# Patient Record
Sex: Female | Born: 1940
Health system: Southern US, Community
[De-identification: ages and names within clinical notes are randomized; demographics above are authoritative.]

## PROBLEM LIST (undated history)

## (undated) DIAGNOSIS — Z9889 Other specified postprocedural states: Secondary | ICD-10-CM

## (undated) DIAGNOSIS — I10 Essential (primary) hypertension: Secondary | ICD-10-CM

## (undated) DIAGNOSIS — Z8719 Personal history of other diseases of the digestive system: Secondary | ICD-10-CM

## (undated) DIAGNOSIS — F329 Major depressive disorder, single episode, unspecified: Secondary | ICD-10-CM

## (undated) DIAGNOSIS — I2510 Atherosclerotic heart disease of native coronary artery without angina pectoris: Secondary | ICD-10-CM

## (undated) DIAGNOSIS — R413 Other amnesia: Secondary | ICD-10-CM

## (undated) DIAGNOSIS — F419 Anxiety disorder, unspecified: Secondary | ICD-10-CM

## (undated) DIAGNOSIS — Z789 Other specified health status: Secondary | ICD-10-CM

## (undated) DIAGNOSIS — R911 Solitary pulmonary nodule: Secondary | ICD-10-CM

## (undated) DIAGNOSIS — I495 Sick sinus syndrome: Secondary | ICD-10-CM

## (undated) DIAGNOSIS — K2210 Ulcer of esophagus without bleeding: Secondary | ICD-10-CM

## (undated) DIAGNOSIS — I509 Heart failure, unspecified: Secondary | ICD-10-CM

## (undated) DIAGNOSIS — G8929 Other chronic pain: Secondary | ICD-10-CM

## (undated) DIAGNOSIS — Z86010 Personal history of colonic polyps: Secondary | ICD-10-CM

## (undated) DIAGNOSIS — E039 Hypothyroidism, unspecified: Secondary | ICD-10-CM

## (undated) DIAGNOSIS — R011 Cardiac murmur, unspecified: Secondary | ICD-10-CM

## (undated) DIAGNOSIS — I6523 Occlusion and stenosis of bilateral carotid arteries: Secondary | ICD-10-CM

## (undated) DIAGNOSIS — J42 Unspecified chronic bronchitis: Secondary | ICD-10-CM

## (undated) DIAGNOSIS — M25551 Pain in right hip: Secondary | ICD-10-CM

## (undated) DIAGNOSIS — K219 Gastro-esophageal reflux disease without esophagitis: Secondary | ICD-10-CM

## (undated) DIAGNOSIS — E785 Hyperlipidemia, unspecified: Secondary | ICD-10-CM

## (undated) DIAGNOSIS — M1990 Unspecified osteoarthritis, unspecified site: Secondary | ICD-10-CM

## (undated) DIAGNOSIS — R51 Headache: Secondary | ICD-10-CM

## (undated) DIAGNOSIS — Z950 Presence of cardiac pacemaker: Secondary | ICD-10-CM

## (undated) DIAGNOSIS — M545 Low back pain, unspecified: Secondary | ICD-10-CM

## (undated) DIAGNOSIS — G4733 Obstructive sleep apnea (adult) (pediatric): Secondary | ICD-10-CM

## (undated) DIAGNOSIS — F411 Generalized anxiety disorder: Secondary | ICD-10-CM

## (undated) DIAGNOSIS — Z9289 Personal history of other medical treatment: Secondary | ICD-10-CM

## (undated) DIAGNOSIS — G43909 Migraine, unspecified, not intractable, without status migrainosus: Secondary | ICD-10-CM

## (undated) DIAGNOSIS — F3181 Bipolar II disorder: Secondary | ICD-10-CM

## (undated) DIAGNOSIS — Z860101 Personal history of adenomatous and serrated colon polyps: Secondary | ICD-10-CM

## (undated) DIAGNOSIS — N182 Chronic kidney disease, stage 2 (mild): Secondary | ICD-10-CM

## (undated) DIAGNOSIS — R519 Headache, unspecified: Secondary | ICD-10-CM

## (undated) HISTORY — PX: TUBAL LIGATION: SHX77

## (undated) HISTORY — PX: ESOPHAGOGASTRODUODENOSCOPY: SHX1529

## (undated) HISTORY — PX: AUGMENTATION MAMMAPLASTY: SUR837

## (undated) HISTORY — DX: Anxiety disorder, unspecified: F41.9

## (undated) HISTORY — PX: CARDIOVASCULAR STRESS TEST: SHX262

## (undated) HISTORY — DX: Migraine, unspecified, not intractable, without status migrainosus: G43.909

## (undated) HISTORY — PX: VAGINAL HYSTERECTOMY: SUR661

## (undated) HISTORY — PX: COLONOSCOPY WITH ESOPHAGOGASTRODUODENOSCOPY (EGD): SHX5779

## (undated) HISTORY — PX: TRANSTHORACIC ECHOCARDIOGRAM: SHX275

## (undated) HISTORY — PX: OVARIAN CYST SURGERY: SHX726

## (undated) HISTORY — PX: OTHER SURGICAL HISTORY: SHX169

## (undated) HISTORY — DX: Essential (primary) hypertension: I10

## (undated) HISTORY — DX: Hyperlipidemia, unspecified: E78.5

## (undated) HISTORY — PX: COLONOSCOPY W/ BIOPSIES AND POLYPECTOMY: SHX1376

## (undated) HISTORY — DX: Gastro-esophageal reflux disease without esophagitis: K21.9

---

## 1969-10-04 HISTORY — PX: APPENDECTOMY: SHX54

## 1999-07-20 ENCOUNTER — Ambulatory Visit (HOSPITAL_COMMUNITY): Admission: RE | Admit: 1999-07-20 | Discharge: 1999-07-20 | Payer: Self-pay | Admitting: Obstetrics and Gynecology

## 1999-07-20 ENCOUNTER — Encounter: Payer: Self-pay | Admitting: Obstetrics and Gynecology

## 1999-08-03 ENCOUNTER — Other Ambulatory Visit: Admission: RE | Admit: 1999-08-03 | Discharge: 1999-08-03 | Payer: Self-pay | Admitting: Obstetrics and Gynecology

## 1999-08-18 ENCOUNTER — Encounter: Admission: RE | Admit: 1999-08-18 | Discharge: 1999-08-18 | Payer: Self-pay | Admitting: Obstetrics and Gynecology

## 1999-08-18 ENCOUNTER — Encounter: Payer: Self-pay | Admitting: Obstetrics and Gynecology

## 1999-08-21 ENCOUNTER — Encounter: Payer: Self-pay | Admitting: Obstetrics and Gynecology

## 1999-08-21 ENCOUNTER — Ambulatory Visit (HOSPITAL_COMMUNITY): Admission: RE | Admit: 1999-08-21 | Discharge: 1999-08-21 | Payer: Self-pay | Admitting: Obstetrics and Gynecology

## 1999-09-15 ENCOUNTER — Encounter: Payer: Self-pay | Admitting: Internal Medicine

## 1999-09-15 ENCOUNTER — Ambulatory Visit (HOSPITAL_COMMUNITY): Admission: RE | Admit: 1999-09-15 | Discharge: 1999-09-15 | Payer: Self-pay | Admitting: Internal Medicine

## 2000-07-21 ENCOUNTER — Other Ambulatory Visit: Admission: RE | Admit: 2000-07-21 | Discharge: 2000-07-21 | Payer: Self-pay | Admitting: Obstetrics and Gynecology

## 2000-09-15 ENCOUNTER — Encounter: Payer: Self-pay | Admitting: Obstetrics and Gynecology

## 2000-09-15 ENCOUNTER — Encounter: Admission: RE | Admit: 2000-09-15 | Discharge: 2000-09-15 | Payer: Self-pay | Admitting: Obstetrics and Gynecology

## 2001-08-07 ENCOUNTER — Other Ambulatory Visit: Admission: RE | Admit: 2001-08-07 | Discharge: 2001-08-07 | Payer: Self-pay | Admitting: Obstetrics and Gynecology

## 2001-10-09 ENCOUNTER — Encounter: Payer: Self-pay | Admitting: Obstetrics and Gynecology

## 2001-10-09 ENCOUNTER — Encounter: Admission: RE | Admit: 2001-10-09 | Discharge: 2001-10-09 | Payer: Self-pay | Admitting: Obstetrics and Gynecology

## 2001-10-11 ENCOUNTER — Encounter: Admission: RE | Admit: 2001-10-11 | Discharge: 2001-10-11 | Payer: Self-pay | Admitting: *Deleted

## 2002-09-05 ENCOUNTER — Other Ambulatory Visit: Admission: RE | Admit: 2002-09-05 | Discharge: 2002-09-05 | Payer: Self-pay | Admitting: Obstetrics and Gynecology

## 2002-10-10 ENCOUNTER — Encounter: Payer: Self-pay | Admitting: Obstetrics and Gynecology

## 2002-10-10 ENCOUNTER — Encounter: Admission: RE | Admit: 2002-10-10 | Discharge: 2002-10-10 | Payer: Self-pay | Admitting: Obstetrics and Gynecology

## 2003-08-14 ENCOUNTER — Encounter: Admission: RE | Admit: 2003-08-14 | Discharge: 2003-08-14 | Payer: Self-pay | Admitting: Obstetrics and Gynecology

## 2003-09-16 ENCOUNTER — Other Ambulatory Visit: Admission: RE | Admit: 2003-09-16 | Discharge: 2003-09-16 | Payer: Self-pay | Admitting: Obstetrics and Gynecology

## 2004-02-11 ENCOUNTER — Inpatient Hospital Stay (HOSPITAL_COMMUNITY): Admission: EM | Admit: 2004-02-11 | Discharge: 2004-02-15 | Payer: Self-pay | Admitting: Internal Medicine

## 2004-02-12 ENCOUNTER — Encounter (INDEPENDENT_AMBULATORY_CARE_PROVIDER_SITE_OTHER): Payer: Self-pay | Admitting: Specialist

## 2004-02-26 ENCOUNTER — Ambulatory Visit (HOSPITAL_COMMUNITY): Admission: RE | Admit: 2004-02-26 | Discharge: 2004-02-26 | Payer: Self-pay | Admitting: Internal Medicine

## 2004-02-26 ENCOUNTER — Encounter (INDEPENDENT_AMBULATORY_CARE_PROVIDER_SITE_OTHER): Payer: Self-pay | Admitting: Specialist

## 2004-02-26 HISTORY — PX: ESOPHAGOGASTRODUODENOSCOPY: SHX1529

## 2004-08-17 ENCOUNTER — Encounter: Admission: RE | Admit: 2004-08-17 | Discharge: 2004-08-17 | Payer: Self-pay | Admitting: Internal Medicine

## 2004-09-16 ENCOUNTER — Other Ambulatory Visit: Admission: RE | Admit: 2004-09-16 | Discharge: 2004-09-16 | Payer: Self-pay | Admitting: Obstetrics and Gynecology

## 2005-08-17 ENCOUNTER — Ambulatory Visit: Payer: Self-pay | Admitting: Internal Medicine

## 2005-08-18 ENCOUNTER — Encounter: Admission: RE | Admit: 2005-08-18 | Discharge: 2005-08-18 | Payer: Self-pay | Admitting: Obstetrics and Gynecology

## 2005-09-01 ENCOUNTER — Ambulatory Visit: Payer: Self-pay | Admitting: Internal Medicine

## 2005-09-06 ENCOUNTER — Other Ambulatory Visit: Admission: RE | Admit: 2005-09-06 | Discharge: 2005-09-06 | Payer: Self-pay | Admitting: Obstetrics and Gynecology

## 2005-09-07 ENCOUNTER — Ambulatory Visit: Payer: Self-pay | Admitting: Internal Medicine

## 2005-09-14 ENCOUNTER — Ambulatory Visit: Payer: Self-pay | Admitting: Cardiology

## 2006-08-03 ENCOUNTER — Ambulatory Visit: Payer: Self-pay | Admitting: Internal Medicine

## 2006-08-16 ENCOUNTER — Ambulatory Visit: Payer: Self-pay | Admitting: Internal Medicine

## 2006-08-24 ENCOUNTER — Other Ambulatory Visit: Admission: RE | Admit: 2006-08-24 | Discharge: 2006-08-24 | Payer: Self-pay | Admitting: Obstetrics and Gynecology

## 2006-09-15 ENCOUNTER — Ambulatory Visit: Payer: Self-pay | Admitting: Internal Medicine

## 2006-12-20 ENCOUNTER — Ambulatory Visit: Payer: Self-pay | Admitting: Internal Medicine

## 2007-01-30 ENCOUNTER — Ambulatory Visit: Payer: Self-pay | Admitting: Internal Medicine

## 2007-01-30 LAB — CONVERTED CEMR LAB
ALT: 21 units/L (ref 0–40)
AST: 22 units/L (ref 0–37)
Albumin: 3.8 g/dL (ref 3.5–5.2)
Alkaline Phosphatase: 42 units/L (ref 39–117)
BUN: 14 mg/dL (ref 6–23)
Bacteria, UA: NEGATIVE
Basophils Absolute: 0.1 10*3/uL (ref 0.0–0.1)
Basophils Relative: 1 % (ref 0.0–1.0)
Bilirubin Urine: NEGATIVE
Bilirubin, Direct: 0.1 mg/dL (ref 0.0–0.3)
CO2: 31 meq/L (ref 19–32)
Calcium: 9.2 mg/dL (ref 8.4–10.5)
Chloride: 107 meq/L (ref 96–112)
Cholesterol: 231 mg/dL (ref 0–200)
Creatinine, Ser: 0.9 mg/dL (ref 0.4–1.2)
Crystals: NEGATIVE
Direct LDL: 87.5 mg/dL
Eosinophils Absolute: 0.2 10*3/uL (ref 0.0–0.6)
Eosinophils Relative: 3.4 % (ref 0.0–5.0)
GFR calc Af Amer: 80 mL/min
GFR calc non Af Amer: 66 mL/min
Glucose, Bld: 103 mg/dL — ABNORMAL HIGH (ref 70–99)
HCT: 39.5 % (ref 36.0–46.0)
HDL: 107.8 mg/dL (ref >39.0)
Hemoglobin: 13.4 g/dL (ref 12.0–15.0)
Ketones, ur: NEGATIVE mg/dL
Lymphocytes Relative: 40.6 % (ref 12.0–46.0)
MCHC: 34 g/dL (ref 30.0–36.0)
MCV: 96.6 fL (ref 78.0–100.0)
Monocytes Absolute: 0.6 10*3/uL (ref 0.2–0.7)
Monocytes Relative: 11.4 % — ABNORMAL HIGH (ref 3.0–11.0)
Mucus, UA: NEGATIVE
Neutro Abs: 2.3 10*3/uL (ref 1.4–7.7)
Neutrophils Relative %: 43.6 % (ref 43.0–77.0)
Nitrite: NEGATIVE
Platelets: 238 10*3/uL (ref 150–400)
Potassium: 4 meq/L (ref 3.5–5.1)
RBC: 4.09 M/uL (ref 3.87–5.11)
RDW: 12.3 % (ref 11.5–14.6)
Sodium: 143 meq/L (ref 135–145)
Specific Gravity, Urine: <=1.005 (ref 1.000–1.03)
TSH: 2.88 microintl units/mL (ref 0.35–5.50)
Total Bilirubin: 0.5 mg/dL (ref 0.3–1.2)
Total CHOL/HDL Ratio: 2.1
Total Protein, Urine: NEGATIVE mg/dL
Total Protein: 6.7 g/dL (ref 6.0–8.3)
Triglycerides: 36 mg/dL (ref 0–149)
Urine Glucose: NEGATIVE mg/dL
Urobilinogen, UA: 0.2 (ref 0.0–1.0)
VLDL: 7 mg/dL (ref 0–40)
Vit D, 1,25-Dihydroxy: 33 (ref 20–57)
Vitamin B-12: 617 pg/mL (ref 211–911)
WBC: 5.4 10*3/uL (ref 4.5–10.5)
pH: 7 (ref 5.0–8.0)

## 2007-02-08 ENCOUNTER — Ambulatory Visit: Payer: Self-pay | Admitting: Cardiology

## 2007-02-08 ENCOUNTER — Ambulatory Visit: Payer: Self-pay

## 2007-03-10 ENCOUNTER — Ambulatory Visit: Payer: Self-pay | Admitting: Internal Medicine

## 2007-03-13 ENCOUNTER — Encounter: Admission: RE | Admit: 2007-03-13 | Discharge: 2007-03-13 | Payer: Self-pay | Admitting: Neurology

## 2007-03-28 ENCOUNTER — Ambulatory Visit: Payer: Self-pay | Admitting: Internal Medicine

## 2007-03-28 LAB — CONVERTED CEMR LAB
ALT: 19 units/L (ref 0–40)
AST: 19 units/L (ref 0–37)
Albumin: 3.7 g/dL (ref 3.5–5.2)
Alkaline Phosphatase: 35 units/L — ABNORMAL LOW (ref 39–117)
BUN: 18 mg/dL (ref 6–23)
Basophils Absolute: 0 10*3/uL (ref 0.0–0.1)
Basophils Relative: 0.1 % (ref 0.0–1.0)
Bilirubin Urine: NEGATIVE
Bilirubin, Direct: 0.1 mg/dL (ref 0.0–0.3)
CO2: 28 meq/L (ref 19–32)
Calcium: 9.4 mg/dL (ref 8.4–10.5)
Chloride: 96 meq/L (ref 96–112)
Creatinine, Ser: 0.8 mg/dL (ref 0.4–1.2)
Crystals: NEGATIVE
Eosinophils Absolute: 0.1 10*3/uL (ref 0.0–0.6)
Eosinophils Relative: 1.5 % (ref 0.0–5.0)
GFR calc Af Amer: 92 mL/min
GFR calc non Af Amer: 76 mL/min
Glucose, Bld: 115 mg/dL — ABNORMAL HIGH (ref 70–99)
HCT: 38.8 % (ref 36.0–46.0)
Hemoglobin: 13 g/dL (ref 12.0–15.0)
Ketones, ur: NEGATIVE mg/dL
Lymphocytes Relative: 28.6 % (ref 12.0–46.0)
MCHC: 33.5 g/dL (ref 30.0–36.0)
MCV: 96.2 fL (ref 78.0–100.0)
Monocytes Absolute: 0.6 10*3/uL (ref 0.2–0.7)
Monocytes Relative: 9.5 % (ref 3.0–11.0)
Mucus, UA: NEGATIVE
Neutro Abs: 4.2 10*3/uL (ref 1.4–7.7)
Neutrophils Relative %: 60.3 % (ref 43.0–77.0)
Nitrite: NEGATIVE
Platelets: 246 10*3/uL (ref 150–400)
Potassium: 4 meq/L (ref 3.5–5.1)
RBC: 4.04 M/uL (ref 3.87–5.11)
RDW: 11.6 % (ref 11.5–14.6)
Sed Rate: 10 mm/hr (ref 0–25)
Sodium: 138 meq/L (ref 135–145)
Specific Gravity, Urine: <=1.005 (ref 1.000–1.03)
Total Bilirubin: 0.4 mg/dL (ref 0.3–1.2)
Total Protein, Urine: NEGATIVE mg/dL
Total Protein: 6.1 g/dL (ref 6.0–8.3)
Urine Glucose: NEGATIVE mg/dL
Urobilinogen, UA: 0.2 (ref 0.0–1.0)
WBC: 6.8 10*3/uL (ref 4.5–10.5)
pH: 6.5 (ref 5.0–8.0)

## 2007-04-10 ENCOUNTER — Ambulatory Visit: Payer: Self-pay | Admitting: Cardiology

## 2007-04-10 LAB — CONVERTED CEMR LAB
ALT: 22 units/L (ref 0–35)
AST: 17 units/L (ref 0–37)
Albumin: 3.6 g/dL (ref 3.5–5.2)
Alkaline Phosphatase: 41 units/L (ref 39–117)
BUN: 15 mg/dL (ref 6–23)
Basophils Absolute: 0 10*3/uL (ref 0.0–0.1)
Basophils Relative: 0.6 % (ref 0.0–1.0)
Bilirubin, Direct: 0.1 mg/dL (ref 0.0–0.3)
CO2: 34 meq/L — ABNORMAL HIGH (ref 19–32)
Calcium: 8.9 mg/dL (ref 8.4–10.5)
Chloride: 104 meq/L (ref 96–112)
Cholesterol: 215 mg/dL (ref 0–200)
Creatinine, Ser: 0.8 mg/dL (ref 0.4–1.2)
Direct LDL: 98.2 mg/dL
Eosinophils Absolute: 0.1 10*3/uL (ref 0.0–0.6)
Eosinophils Relative: 1 % (ref 0.0–5.0)
GFR calc Af Amer: 92 mL/min
GFR calc non Af Amer: 76 mL/min
Glucose, Bld: 95 mg/dL (ref 70–99)
HCT: 37.9 % (ref 36.0–46.0)
HDL: 104.6 mg/dL (ref >39.0)
Hemoglobin: 12.7 g/dL (ref 12.0–15.0)
Lymphocytes Relative: 23.6 % (ref 12.0–46.0)
MCHC: 33.5 g/dL (ref 30.0–36.0)
MCV: 95.8 fL (ref 78.0–100.0)
Monocytes Absolute: 0.6 10*3/uL (ref 0.2–0.7)
Monocytes Relative: 10.8 % (ref 3.0–11.0)
Neutro Abs: 3.7 10*3/uL (ref 1.4–7.7)
Neutrophils Relative %: 64 % (ref 43.0–77.0)
Platelets: 248 10*3/uL (ref 150–400)
Potassium: 3.4 meq/L — ABNORMAL LOW (ref 3.5–5.1)
RBC: 3.95 M/uL (ref 3.87–5.11)
RDW: 12.2 % (ref 11.5–14.6)
Sodium: 138 meq/L (ref 135–145)
TSH: 2.42 microintl units/mL (ref 0.35–5.50)
Total Bilirubin: 0.5 mg/dL (ref 0.3–1.2)
Total CHOL/HDL Ratio: 2.1
Total CK: 54 units/L (ref 7–177)
Total Protein: 6.4 g/dL (ref 6.0–8.3)
Triglycerides: 57 mg/dL (ref 0–149)
VLDL: 11 mg/dL (ref 0–40)
WBC: 5.7 10*3/uL (ref 4.5–10.5)

## 2007-06-12 ENCOUNTER — Encounter: Admission: RE | Admit: 2007-06-12 | Discharge: 2007-06-12 | Payer: Self-pay | Admitting: Obstetrics and Gynecology

## 2007-06-15 ENCOUNTER — Ambulatory Visit: Payer: Self-pay | Admitting: Internal Medicine

## 2007-06-15 LAB — CONVERTED CEMR LAB
ALT: 15 units/L (ref 0–35)
AST: 15 units/L (ref 0–37)
Albumin: 3.8 g/dL (ref 3.5–5.2)
Alkaline Phosphatase: 42 units/L (ref 39–117)
BUN: 11 mg/dL (ref 6–23)
Bacteria, UA: NEGATIVE
Basophils Absolute: 0 10*3/uL (ref 0.0–0.1)
Basophils Relative: 0.5 % (ref 0.0–1.0)
Bilirubin Urine: NEGATIVE
Bilirubin, Direct: 0.1 mg/dL (ref 0.0–0.3)
CO2: 31 meq/L (ref 19–32)
Calcium: 9 mg/dL (ref 8.4–10.5)
Chloride: 101 meq/L (ref 96–112)
Cholesterol: 199 mg/dL (ref 0–200)
Cortisol, Plasma: 21.8 ug/dL
Creatinine, Ser: 1 mg/dL (ref 0.4–1.2)
Crystals: NEGATIVE
Eosinophils Absolute: 0.1 10*3/uL (ref 0.0–0.6)
Eosinophils Relative: 2.5 % (ref 0.0–5.0)
GFR calc Af Amer: 71 mL/min
GFR calc non Af Amer: 59 mL/min
Glucose, Bld: 98 mg/dL (ref 70–99)
HCT: 36.2 % (ref 36.0–46.0)
HDL: 104.1 mg/dL (ref >39.0)
Hemoglobin: 12.2 g/dL (ref 12.0–15.0)
Ketones, ur: NEGATIVE mg/dL
LDL Cholesterol: 89 mg/dL (ref 0–99)
Lymphocytes Relative: 30.1 % (ref 12.0–46.0)
MCHC: 33.8 g/dL (ref 30.0–36.0)
MCV: 94.7 fL (ref 78.0–100.0)
Monocytes Absolute: 0.5 10*3/uL (ref 0.2–0.7)
Monocytes Relative: 10.2 % (ref 3.0–11.0)
Neutro Abs: 2.8 10*3/uL (ref 1.4–7.7)
Neutrophils Relative %: 56.7 % (ref 43.0–77.0)
Nitrite: NEGATIVE
Platelets: 239 10*3/uL (ref 150–400)
Potassium: 3.4 meq/L — ABNORMAL LOW (ref 3.5–5.1)
RBC: 3.82 M/uL — ABNORMAL LOW (ref 3.87–5.11)
RDW: 12.1 % (ref 11.5–14.6)
Sodium: 138 meq/L (ref 135–145)
Specific Gravity, Urine: 1.015 (ref 1.000–1.03)
TSH: 3.55 microintl units/mL (ref 0.35–5.50)
Total Bilirubin: 0.6 mg/dL (ref 0.3–1.2)
Total CHOL/HDL Ratio: 1.9
Total Protein, Urine: NEGATIVE mg/dL
Total Protein: 6.2 g/dL (ref 6.0–8.3)
Triglycerides: 31 mg/dL (ref 0–149)
Urine Glucose: NEGATIVE mg/dL
Urobilinogen, UA: 0.2 (ref 0.0–1.0)
VLDL: 6 mg/dL (ref 0–40)
Vitamin B-12: 404 pg/mL (ref 211–911)
WBC: 4.8 10*3/uL (ref 4.5–10.5)
pH: 6 (ref 5.0–8.0)

## 2007-07-21 ENCOUNTER — Ambulatory Visit: Payer: Self-pay | Admitting: Internal Medicine

## 2007-07-27 ENCOUNTER — Telehealth: Payer: Self-pay | Admitting: Internal Medicine

## 2007-08-22 ENCOUNTER — Encounter: Payer: Self-pay | Admitting: Internal Medicine

## 2007-08-22 DIAGNOSIS — F411 Generalized anxiety disorder: Secondary | ICD-10-CM | POA: Insufficient documentation

## 2007-08-22 DIAGNOSIS — I25810 Atherosclerosis of coronary artery bypass graft(s) without angina pectoris: Secondary | ICD-10-CM

## 2007-08-22 DIAGNOSIS — M810 Age-related osteoporosis without current pathological fracture: Secondary | ICD-10-CM | POA: Insufficient documentation

## 2007-08-22 DIAGNOSIS — K219 Gastro-esophageal reflux disease without esophagitis: Secondary | ICD-10-CM

## 2007-08-22 DIAGNOSIS — M545 Low back pain: Secondary | ICD-10-CM

## 2007-08-22 DIAGNOSIS — E785 Hyperlipidemia, unspecified: Secondary | ICD-10-CM

## 2007-08-22 DIAGNOSIS — G4700 Insomnia, unspecified: Secondary | ICD-10-CM

## 2007-08-22 DIAGNOSIS — F319 Bipolar disorder, unspecified: Secondary | ICD-10-CM | POA: Insufficient documentation

## 2007-08-22 DIAGNOSIS — R011 Cardiac murmur, unspecified: Secondary | ICD-10-CM

## 2007-08-23 ENCOUNTER — Encounter: Payer: Self-pay | Admitting: Internal Medicine

## 2007-08-25 ENCOUNTER — Telehealth: Payer: Self-pay | Admitting: Internal Medicine

## 2007-08-28 ENCOUNTER — Other Ambulatory Visit: Admission: RE | Admit: 2007-08-28 | Discharge: 2007-08-28 | Payer: Self-pay | Admitting: Obstetrics and Gynecology

## 2008-03-20 ENCOUNTER — Ambulatory Visit: Payer: Self-pay | Admitting: Internal Medicine

## 2008-03-20 LAB — CONVERTED CEMR LAB
ALT: 57 units/L — ABNORMAL HIGH (ref 0–35)
AST: 40 units/L — ABNORMAL HIGH (ref 0–37)
Albumin: 3.7 g/dL (ref 3.5–5.2)
Alkaline Phosphatase: 56 units/L (ref 39–117)
BUN: 16 mg/dL (ref 6–23)
Basophils Absolute: 0 10*3/uL (ref 0.0–0.1)
Basophils Relative: 0.2 % (ref 0.0–1.0)
Bilirubin, Direct: 0.1 mg/dL (ref 0.0–0.3)
CO2: 32 meq/L (ref 19–32)
Calcium: 8.9 mg/dL (ref 8.4–10.5)
Chloride: 108 meq/L (ref 96–112)
Creatinine, Ser: 0.8 mg/dL (ref 0.4–1.2)
Eosinophils Absolute: 0.1 10*3/uL (ref 0.0–0.7)
Eosinophils Relative: 2.9 % (ref 0.0–5.0)
GFR calc Af Amer: 92 mL/min
GFR calc non Af Amer: 76 mL/min
Glucose, Bld: 97 mg/dL (ref 70–99)
HCT: 36.6 % (ref 36.0–46.0)
Hemoglobin: 12.1 g/dL (ref 12.0–15.0)
Lymphocytes Relative: 31.6 % (ref 12.0–46.0)
MCHC: 33.1 g/dL (ref 30.0–36.0)
MCV: 97 fL (ref 78.0–100.0)
Monocytes Absolute: 0.5 10*3/uL (ref 0.1–1.0)
Monocytes Relative: 10 % (ref 3.0–12.0)
Neutro Abs: 2.7 10*3/uL (ref 1.4–7.7)
Neutrophils Relative %: 55.3 % (ref 43.0–77.0)
Platelets: 187 10*3/uL (ref 150–400)
Potassium: 3.6 meq/L (ref 3.5–5.1)
RBC: 3.77 M/uL — ABNORMAL LOW (ref 3.87–5.11)
RDW: 12.9 % (ref 11.5–14.6)
Sed Rate: 10 mm/hr (ref 0–22)
Sodium: 144 meq/L (ref 135–145)
TSH: 3.45 microintl units/mL (ref 0.35–5.50)
Total Bilirubin: 0.4 mg/dL (ref 0.3–1.2)
Total Protein: 6.1 g/dL (ref 6.0–8.3)
WBC: 4.8 10*3/uL (ref 4.5–10.5)

## 2008-03-25 LAB — CONVERTED CEMR LAB: Vit D, 1,25-Dihydroxy: 37 (ref 30–89)

## 2008-05-07 ENCOUNTER — Ambulatory Visit: Payer: Self-pay

## 2008-05-14 ENCOUNTER — Ambulatory Visit: Payer: Self-pay | Admitting: Psychology

## 2008-05-14 ENCOUNTER — Encounter: Admission: RE | Admit: 2008-05-14 | Discharge: 2008-05-29 | Payer: Self-pay | Admitting: Neurology

## 2008-05-27 ENCOUNTER — Telehealth: Payer: Self-pay | Admitting: Internal Medicine

## 2008-07-11 ENCOUNTER — Ambulatory Visit: Payer: Self-pay | Admitting: Internal Medicine

## 2008-07-17 ENCOUNTER — Encounter: Payer: Self-pay | Admitting: Internal Medicine

## 2008-09-02 ENCOUNTER — Ambulatory Visit: Payer: Self-pay | Admitting: Obstetrics and Gynecology

## 2008-09-02 ENCOUNTER — Ambulatory Visit: Payer: Self-pay | Admitting: Internal Medicine

## 2008-10-29 ENCOUNTER — Encounter: Admission: RE | Admit: 2008-10-29 | Discharge: 2008-10-29 | Payer: Self-pay | Admitting: Neurology

## 2009-01-07 ENCOUNTER — Ambulatory Visit: Payer: Self-pay | Admitting: Internal Medicine

## 2009-01-07 LAB — CONVERTED CEMR LAB
ALT: 18 units/L (ref 0–35)
AST: 16 units/L (ref 0–37)
Albumin: 3.6 g/dL (ref 3.5–5.2)
Alkaline Phosphatase: 32 units/L — ABNORMAL LOW (ref 39–117)
BUN: 14 mg/dL (ref 6–23)
Basophils Absolute: 0 10*3/uL (ref 0.0–0.1)
Basophils Relative: 0.1 % (ref 0.0–3.0)
Bilirubin, Direct: 0 mg/dL (ref 0.0–0.3)
CO2: 31 meq/L (ref 19–32)
Calcium: 9.1 mg/dL (ref 8.4–10.5)
Chloride: 98 meq/L (ref 96–112)
Creatinine, Ser: 1 mg/dL (ref 0.4–1.2)
Eosinophils Absolute: 0.1 10*3/uL (ref 0.0–0.7)
Eosinophils Relative: 1.6 % (ref 0.0–5.0)
GFR calc non Af Amer: 58.43 mL/min (ref >60)
Glucose, Bld: 135 mg/dL — ABNORMAL HIGH (ref 70–99)
HCT: 34.1 % — ABNORMAL LOW (ref 36.0–46.0)
Hemoglobin: 11.9 g/dL — ABNORMAL LOW (ref 12.0–15.0)
Lymphocytes Relative: 19.4 % (ref 12.0–46.0)
Lymphs Abs: 1.5 10*3/uL (ref 0.7–4.0)
MCHC: 34.9 g/dL (ref 30.0–36.0)
MCV: 89.2 fL (ref 78.0–100.0)
Magnesium: 1.9 mg/dL (ref 1.5–2.5)
Monocytes Absolute: 0.7 10*3/uL (ref 0.1–1.0)
Monocytes Relative: 8.9 % (ref 3.0–12.0)
Neutro Abs: 5.3 10*3/uL (ref 1.4–7.7)
Neutrophils Relative %: 70 % (ref 43.0–77.0)
Platelets: 189 10*3/uL (ref 150.0–400.0)
Potassium: 3.7 meq/L (ref 3.5–5.1)
RBC: 3.82 M/uL — ABNORMAL LOW (ref 3.87–5.11)
RDW: 12.5 % (ref 11.5–14.6)
Sodium: 135 meq/L (ref 135–145)
Total Bilirubin: 0.6 mg/dL (ref 0.3–1.2)
Total Protein: 6 g/dL (ref 6.0–8.3)
WBC: 7.6 10*3/uL (ref 4.5–10.5)

## 2009-04-10 ENCOUNTER — Encounter: Payer: Self-pay | Admitting: Cardiology

## 2009-04-15 ENCOUNTER — Ambulatory Visit: Payer: Self-pay | Admitting: Obstetrics and Gynecology

## 2009-05-19 ENCOUNTER — Ambulatory Visit: Payer: Self-pay | Admitting: Internal Medicine

## 2009-08-13 ENCOUNTER — Encounter (INDEPENDENT_AMBULATORY_CARE_PROVIDER_SITE_OTHER): Payer: Self-pay | Admitting: *Deleted

## 2009-09-04 ENCOUNTER — Other Ambulatory Visit: Admission: RE | Admit: 2009-09-04 | Discharge: 2009-09-04 | Payer: Self-pay | Admitting: Obstetrics and Gynecology

## 2009-09-04 ENCOUNTER — Ambulatory Visit: Payer: Self-pay | Admitting: Obstetrics and Gynecology

## 2009-09-05 ENCOUNTER — Encounter: Payer: Self-pay | Admitting: Cardiology

## 2009-09-05 ENCOUNTER — Ambulatory Visit: Payer: Self-pay | Admitting: Internal Medicine

## 2009-09-09 ENCOUNTER — Encounter: Payer: Self-pay | Admitting: Cardiology

## 2009-09-10 ENCOUNTER — Ambulatory Visit: Payer: Self-pay | Admitting: Obstetrics and Gynecology

## 2009-09-11 ENCOUNTER — Ambulatory Visit: Payer: Self-pay | Admitting: Cardiology

## 2009-09-12 ENCOUNTER — Encounter: Payer: Self-pay | Admitting: Cardiovascular Disease

## 2009-09-12 ENCOUNTER — Ambulatory Visit: Payer: Self-pay

## 2009-09-12 ENCOUNTER — Encounter: Payer: Self-pay | Admitting: Cardiology

## 2009-10-15 ENCOUNTER — Telehealth (INDEPENDENT_AMBULATORY_CARE_PROVIDER_SITE_OTHER): Payer: Self-pay | Admitting: *Deleted

## 2010-01-28 ENCOUNTER — Telehealth: Payer: Self-pay | Admitting: Cardiology

## 2010-02-02 ENCOUNTER — Encounter: Payer: Self-pay | Admitting: Cardiology

## 2010-05-28 ENCOUNTER — Ambulatory Visit: Payer: Self-pay | Admitting: Internal Medicine

## 2010-07-27 ENCOUNTER — Encounter: Payer: Self-pay | Admitting: Cardiology

## 2010-09-11 ENCOUNTER — Ambulatory Visit: Payer: Self-pay | Admitting: Internal Medicine

## 2010-09-27 ENCOUNTER — Encounter: Payer: Self-pay | Admitting: Cardiology

## 2010-10-25 ENCOUNTER — Encounter: Payer: Self-pay | Admitting: Neurology

## 2010-11-05 NOTE — Progress Notes (Signed)
Summary: med question   Phone Note Call from Patient Call back at [phone]   Caller: Meghan Oliver Reason for Call: Talk to Nurse Summary of Call: request to speak to nurse about benicar Initial call taken by: Migdalia Dk,  January 28, 2010 9:21 AM  Follow-up for Phone Call        I spoke with Meghan Oliver and she said Dr Riley Kill had stopped her in the hall at the hospital about the pt's labwork.  [name] said the pt is getting ready to have repeat labs drawn at Dr Beryle Quant office.  I asked Meghan Oliver to please forward those results to Dr Riley Kill for review.   Follow-up by: Julieta Gutting, RN, BSN,  January 28, 2010 10:27 AM

## 2010-11-05 NOTE — Letter (Signed)
Summary: [facility]   Imported By: Marylou Mccoy 08/25/2010 16:00:17  _____________________________________________________________________  External Attachment:    Type:   Image     Comment:   External Document

## 2010-11-05 NOTE — Progress Notes (Signed)
   Phone Note Other Incoming   Caller: Rusty Reason for Call: Get patient information Action Taken: Information Sent Initial call taken by: Denny Peon    FAxed over Doppler Results to Dr.baxley's Office to fax [phone] CB:[phone] [facility]  October 15, 2009 11:36 AM

## 2010-11-05 NOTE — Progress Notes (Signed)
Summary: Patient's at Home Med List  Patient's at Home Med List   Imported By: Marylou Mccoy 10/15/2009 15:01:59  _____________________________________________________________________  External Attachment:    Type:   Image     Comment:   External Document

## 2010-11-06 ENCOUNTER — Encounter: Payer: Self-pay | Admitting: Cardiology

## 2010-11-06 ENCOUNTER — Encounter (INDEPENDENT_AMBULATORY_CARE_PROVIDER_SITE_OTHER): Payer: Medicare Other

## 2010-11-06 ENCOUNTER — Ambulatory Visit (HOSPITAL_COMMUNITY): Payer: Medicare Other | Attending: Cardiology

## 2010-11-06 ENCOUNTER — Ambulatory Visit (INDEPENDENT_AMBULATORY_CARE_PROVIDER_SITE_OTHER): Payer: Medicare Other | Admitting: Cardiology

## 2010-11-06 ENCOUNTER — Other Ambulatory Visit: Payer: Self-pay | Admitting: Cardiology

## 2010-11-06 DIAGNOSIS — M79609 Pain in unspecified limb: Secondary | ICD-10-CM

## 2010-11-06 DIAGNOSIS — R609 Edema, unspecified: Secondary | ICD-10-CM | POA: Insufficient documentation

## 2010-11-06 DIAGNOSIS — I10 Essential (primary) hypertension: Secondary | ICD-10-CM | POA: Insufficient documentation

## 2010-11-06 DIAGNOSIS — R079 Chest pain, unspecified: Secondary | ICD-10-CM | POA: Insufficient documentation

## 2010-11-06 LAB — BASIC METABOLIC PANEL
BUN: 22 mg/dL (ref 6–23)
Chloride: 105 mEq/L (ref 96–112)
Potassium: 4.5 mEq/L (ref 3.5–5.1)

## 2010-11-06 LAB — BRAIN NATRIURETIC PEPTIDE: Pro B Natriuretic peptide (BNP): 137.1 pg/mL — ABNORMAL HIGH (ref 0.0–100.0)

## 2010-11-11 NOTE — Assessment & Plan Note (Addendum)
Summary: bp issues and edema/lwb   Vital Signs:  Patient profile:   70 year old female Height:      60 inches Weight:      138 pounds BMI:     27.05 Pulse rate:   50 / minute Pulse rhythm:   regular BP sitting:   134 / 80  (left arm) Cuff size:   regular  Vitals Entered By: Judithe Modest CMA (November 06, 2010 10:01 AM)  Primary Provider:  Dr. Lenord Fellers  CC:  bilateral edema.  No other complaints today.  Pt seen in ED in [place] on 12/25.  Pt having no chest pain or SOB.  History of Present Illness: 70 yo with history of HTN and carotid disease presents for cardiology evaluation.  She was last seen by Dr. Riley Kill back in 2008.  The main precipitator for this visit is lower extremity edema.  This seems to have developed about a month ago and is greater in the right lower leg.  There is no leg pain.  She denies any dyspnea with exertion.  She did have an episode of chest pain about a month ago and went to the ER in [place] on 12/24.  She had pain across her left lower chest that would come and go for several days. In the ER, cardiac enzymes were negative and the pain was thought to be musculoskeletal.  It completely resolved and she has been pain-free for the last month.  Blood pressure has also been running high, reaching up to systolic in the 190s.  Today, BP is 134/80.   Labs (12/11): D dimer negative, cardiac enzymes negative, LDL 45, HDL 141  ECG: NSR, normal  Current Medications (verified): 1)  Valtrex 500 Mg  Tabs (Valacyclovir Hcl) .... Take 1 By Mouth Qd 2)  Losartan Potassium-Hctz 50-12.5 Mg Tabs (Losartan Potassium-Hctz) .... 2 Tablets Every Morning 3)  Lithium Carbonate 150 Mg Caps (Lithium Carbonate) .... 2 Capsule in The Morning 4)  Crestor 20 Mg  Tabs (Rosuvastatin Calcium) .... Take 1 By Mouth Once Daily 5)  Omeprazole 20 Mg Cpdr (Omeprazole) .... Take One Tablet Once Daily 6)  Toviaz 4 Mg Xr24h-Tab (Fesoterodine Fumarate) .... One Tablet Once Daily 7)  Vitamin D3 2000  Unit Caps (Cholecalciferol) .... Once Daily 8)  Daily Vitamins  Tabs (Multiple Vitamin) .... Once Daily 9)  Amlodipine Besylate 10 Mg Tabs (Amlodipine Besylate) .... One Tablet Once Daily 10)  Aspirin 81 Mg Tabs (Aspirin) .... Once Daily 11)  Prozac 40 Mg Caps (Fluoxetine Hcl) .... Take One Capsule Daily 12)  Zyprexa 5 Mg Tabs (Olanzapine) .... 1/2 Tablet Once Daily 13)  Fish Oil 1200 Mg Caps (Omega-3 Fatty Acids) .... Once Daily 14)  Actonel 150 Mg Tabs (Risedronate Sodium) .... Monthly 15)  Celebrex 200 Mg Caps (Celecoxib) .... Take One Capsule Once Daily 16)  Clonazepam 0.5 Mg Tabs (Clonazepam) .... 1/2 Tablet As Needed  Allergies (verified): No Known Drug Allergies  Past History:  Past Medical History: 1. GERD 2. Hyperlipidemia 3. Anxiety 4. Low back pain 5. Osteoporosis 6. Carotid disease: Carotid dopplers (12/10) with 40-59% bilateral ICA stenosis 7. Hyperlipidemia  Family History: Father with MI at 18.   Social History: Nonsmoker Married Lives in [place] but spends much of the winter in [place].   Review of Systems       All systems reviewed and negative except as per HPI.   Physical Exam  General:  Well developed, well nourished, in no acute distress. Head:  normocephalic and atraumatic Nose:  no deformity, discharge, inflammation, or lesions Mouth:  Teeth, gums and palate normal. Oral mucosa normal. Neck:  Neck supple, no JVD. No masses, thyromegaly or abnormal cervical nodes. Lungs:  Clear bilaterally to auscultation and percussion. Heart:  Non-displaced PMI, chest non-tender; regular rate and rhythm, S1, S2 without rubs or gallops. 2/6 early systolic murmur RUSB.  Carotid upstroke normal, no bruit. Pedals normal pulses. 2+ edema to the knee on the right, 1+ edema to the knee on the left. Lower leg varicosities.  Abdomen:  Bowel sounds positive; abdomen soft and non-tender without masses, organomegaly, or hernias noted. No hepatosplenomegaly. Extremities:   No clubbing or cyanosis. Neurologic:  Alert and oriented x 3. Skin:  Intact without lesions or rashes. Psych:  Normal affect.   Impression & Recommendations:  Problem # 1:  EDEMA (ICD-782.3) Bilateral lower extremity edema, R>L, present for the last month.  No exertional dyspnea/orthopnea.  No neck vein elevation.  This may be due to venous insufficiency.  However, given concern for inadequately controlled blood pressure, I worry that there is a component of diastolic CHF.  Less likely is a DVT (R>L swelling but no trigger for a DVT).  - Check BNP and BMET - Echocardiogram to assess LV and RV function and PA pressure - Lower extremity venous dopplers: This was done today and showed no  - Cut out salt - Given edema and inadequate BP control, I will change her to a little stronger diuretic.  I will stop losartan/HCT and have her instead take losartan and a separate dose of 25 mg chlorthalidone.   Problem # 2:  HYPERTENSION, UNSPECIFIED (ICD-401.9) BP ok today but has been running as high as 190 systolic when she checks at home.  As above, I will start chlorthalidone for better BP control than HCTZ.  I will also start KCl 20 mEq daily with the chlorthalidone.   Problem # 3:  HYPERLIPIDEMIA (ICD-272.4) Lipids lookedexcellent in 12/11.   Problem # 4:  CAROTID ARTERY DISEASE (ICD-433.10) Patient is due to carotid dopplers.  I will set this up.   Problem # 5:  CHEST PAIN-PRECORDIAL (ICD-786.51) Very atypical episode of chest pain on 09/26/10.  No recurrence in the last month.  If it comes back, she will need a stress test.   Other Orders: Venous Duplex Lower Extremity (Venous Duplex Lower) Echocardiogram (Echo) TLB-BMP (Basic Metabolic Panel-BMET) (80048-METABOL) TLB-BNP (B-Natriuretic Peptide) (83880-BNPR)  Patient Instructions: 1)  Your physician has recommended you make the following change in your medication:  2)  Stop Losartan/HCTZ 3)  Start Losartan 100mg  daily.   4)  Start  Chlorthalidone 25mg  daily. 5)  Start KCL(potassium) 20 mEq daily. 6)  Lab today--BMP/BNP 782.3  401.1 7)  Your physician has requested that you have a lower  extremity venous duplex.  This test is an ultrasound of the veins in the legs.  It looks at venous blood flow that carries blood from the heart to the legs. Allow one hour for a Lower Venous exam.  Allow thirty minutes for an Upper Venous exam. There are no restrictions or special instructions. 8)  TODAY AT 12:30 9)  Your physician has requested that you have an echocardiogram.  Echocardiography is a painless test that uses sound waves to create images of your heart. It provides your doctor with information about the size and shape of your heart and how well your heart's chambers and valves are working.  This procedure takes approximately one hour. There are  no restrictions for this procedure. TODAY AFTER THE ULTASOUND OF YOUR LEGS 10)  Your physician recommends that you return for lab work in: about 10 days in [place]---BMP 401.1--you have the order. 11)  Your physician has requested that you have a carotid duplex. This test is an ultrasound of the carotid arteries in your neck. It looks at blood flow through these arteries that supply the brain with blood. Allow one hour for this exam. There are no restrictions or special instructions. APRIL 2012 12)  Your physician recommends that you schedule a follow-up appointment with Dr Riley Kill in April when you return from [place]. Prescriptions: KLOR-CON M20 20 MEQ CR-TABS (POTASSIUM CHLORIDE CRYS CR) one daily  #30 x 11   Entered by:   Katina Dung, RN, BSN   Authorized by:   Marca Ancona, MD   Signed by:   Katina Dung, RN, BSN on 11/06/2010   Method used:   [facility]        [facility] [address] [phone]* (retail)       [address]       [place], [place]  [address]       Ph: [phone] or [phone]       Fax: [phone]   RxID:   [id number] LOSARTAN  POTASSIUM 100 MG TABS (LOSARTAN POTASSIUM) one daily  #30 x 11   Entered by:   Katina Dung, RN, BSN   Authorized by:   Marca Ancona, MD   Signed by:   Katina Dung, RN, BSN on 11/06/2010   Method used:   [facility]        [facility] [address] [phone]* (retail)       [address]       [place], [place]  [address]       Ph: [phone] or [phone]       Fax: [phone]   RxID:   [id number] CHLORTHALIDONE 25 MG TABS (CHLORTHALIDONE) one daily  #30 x 11   Entered by:   Katina Dung, RN, BSN   Authorized by:   Marca Ancona, MD   Signed by:   Katina Dung, RN, BSN on 11/06/2010   Method used:   [facility]        [facility] [address] [phone]* (retail)       [address]       [place], [place]  [address]       Ph: [phone] or [phone]       Fax: [phone]   RxID:   [id number]    Orders Added: 1)  Venous Duplex Lower Extremity [Venous Duplex Lower] 2)  Echocardiogram [Echo] 3)  TLB-BMP (Basic Metabolic Panel-BMET) [80048-METABOL] 4)  TLB-BNP (B-Natriuretic Peptide) [83880-BNPR]

## 2010-11-11 NOTE — Letter (Signed)
Summary: Generic Letter  Architectural technologist, Main Office  [address]   [place], [place] [address]   Phone: [phone]  Fax: [phone]        November 06, 2010 MRN: [id number]    Meghan Oliver DOB [birth year]    BMP in 10 days 401.1 Please fax results to [phone]             Freida Busman McLean,MD  This letter has been electronically signed by your physician.

## 2010-12-10 NOTE — Letter (Signed)
Summary: [facility]  [facility]   Imported By: Marylou Mccoy 11/27/2010 15:43:14  _____________________________________________________________________  External Attachment:    Type:   Image     Comment:   External Document

## 2011-01-11 ENCOUNTER — Encounter: Payer: Self-pay | Admitting: Cardiology

## 2011-01-13 ENCOUNTER — Encounter: Payer: Self-pay | Admitting: Cardiology

## 2011-01-13 ENCOUNTER — Ambulatory Visit (INDEPENDENT_AMBULATORY_CARE_PROVIDER_SITE_OTHER): Payer: Medicare Other | Admitting: Cardiology

## 2011-01-13 DIAGNOSIS — I6529 Occlusion and stenosis of unspecified carotid artery: Secondary | ICD-10-CM

## 2011-01-13 DIAGNOSIS — I10 Essential (primary) hypertension: Secondary | ICD-10-CM

## 2011-01-13 DIAGNOSIS — R609 Edema, unspecified: Secondary | ICD-10-CM

## 2011-01-13 DIAGNOSIS — E785 Hyperlipidemia, unspecified: Secondary | ICD-10-CM

## 2011-01-13 DIAGNOSIS — E7800 Pure hypercholesterolemia, unspecified: Secondary | ICD-10-CM

## 2011-01-13 LAB — BASIC METABOLIC PANEL
CO2: 31 mEq/L (ref 19–32)
GFR: 62.39 mL/min (ref >60.00)
Glucose, Bld: 75 mg/dL (ref 70–99)
Potassium: 3.7 mEq/L (ref 3.5–5.1)
Sodium: 138 mEq/L (ref 135–145)

## 2011-01-13 LAB — HEPATIC FUNCTION PANEL
AST: 25 U/L (ref 0–37)
Albumin: 3.8 g/dL (ref 3.5–5.2)
Alkaline Phosphatase: 37 U/L — ABNORMAL LOW (ref 39–117)
Bilirubin, Direct: 0.2 mg/dL (ref 0.0–0.3)

## 2011-01-13 NOTE — Patient Instructions (Signed)
Your physician recommends that you continue on your current medications as directed. Please refer to the Current Medication list given to you today.  Your physician recommends that you have lab work today: BMP and LIVER  Your physician wants you to follow-up in: 3 MONTHS.  You will receive a reminder letter in the mail two months in advance. If you don't receive a letter, please call our office to schedule the follow-up appointment.

## 2011-01-18 ENCOUNTER — Encounter: Payer: Medicare Other | Admitting: Obstetrics and Gynecology

## 2011-01-27 ENCOUNTER — Other Ambulatory Visit: Payer: Self-pay

## 2011-01-27 DIAGNOSIS — I10 Essential (primary) hypertension: Secondary | ICD-10-CM

## 2011-01-27 MED ORDER — AMLODIPINE BESYLATE 10 MG PO TABS: 10.0000 mg | ORAL_TABLET | Freq: Every day | ORAL | Status: DC

## 2011-01-29 NOTE — Assessment & Plan Note (Signed)
On crestor not quite at target, but below 100mg /dl.

## 2011-01-29 NOTE — Progress Notes (Signed)
HPI:  Meghan Oliver is in for a follow up visit.  She saw Dr. Shirlee Latch in my absence, and had some adjustments made in her medications. An echo was performed which showed some mild increase in PA pressures.  There was a peak of about 36.  She also had lower extremity edema, and dopplers were done. The dopplers were negative for DVT.  She continues to have some edema of the RLE.  Current Outpatient Prescriptions  Medication Sig Dispense Refill  . aspirin 81 MG tablet Take 81 mg by mouth daily.        . celecoxib (CELEBREX) 200 MG capsule Take 200 mg by mouth as needed.       . chlorthalidone (HYGROTON) 25 MG tablet Take 25 mg by mouth daily.        . Cholecalciferol (VITAMIN D3) 2000 UNITS TABS Take 1 tablet by mouth daily.        . clonazePAM (KLONOPIN) 0.5 MG tablet Take 0.5 mg by mouth 2 (two) times daily as needed.        . fesoterodine (TOVIAZ) 4 MG TB24 Take 4 mg by mouth daily.        Marland Kitchen FLUoxetine (PROZAC) 40 MG capsule Take 40 mg by mouth daily.        Marland Kitchen lithium 150 MG capsule Take 150 mg by mouth. 150 every am and 300 every pm       . losartan (COZAAR) 100 MG tablet Take 100 mg by mouth daily.        . Multiple Vitamins-Minerals (MULTIVITAMIN WITH MINERALS) tablet Take 1 tablet by mouth daily.        Marland Kitchen OLANZapine (ZYPREXA) 5 MG tablet Take 5 mg by mouth. Take 1/2 tablet daily       . Omega-3 Fatty Acids (FISH OIL) 1200 MG CAPS Take 1 capsule by mouth daily.        Marland Kitchen omeprazole (PRILOSEC OTC) 20 MG tablet Take 20 mg by mouth daily.        . potassium chloride SA (KLOR-CON M20) 20 MEQ tablet Take 20 mEq by mouth daily.        . risedronate (ACTONEL) 150 MG tablet Take 150 mg by mouth every 30 (thirty) days. with water on empty stomach, nothing by mouth or lie down for next 30 minutes.       . rosuvastatin (CRESTOR) 20 MG tablet Take 20 mg by mouth daily.        . valACYclovir (VALTREX) 500 MG tablet Take 500 mg by mouth as needed.       Marland Kitchen amLODipine (NORVASC) 10 MG tablet Take 1 tablet (10 mg total)  by mouth daily.  90 tablet  3    No Known Allergies  Past Medical History  Diagnosis Date  . GERD (gastroesophageal reflux disease)   . Hyperlipidemia   . Anxiety   . Low back pain   . Osteoporosis   . Carotid disease, bilateral     carotid dopplers 12/10 with 40-59% bilateral ICA stenosis    Past Surgical History  Procedure Date  . Esophagogastroduodenoscopy 02-26-04    Family History  Problem Relation Age of Onset  . Heart attack Father 36    deceased    History   Social History  . Marital Status: Married    Spouse Name: N/A    Number of Children: N/A  . Years of Education: N/A   Occupational History  . Not on file.   Social History Main Topics  .  Smoking status: Former Smoker    Types: Cigarettes    Quit date: 01/13/1971  . Smokeless tobacco: Never Used  . Alcohol Use: 2.0 oz/week    4 drink(s) per week     wine  . Drug Use: No  . Sexually Active: Not on file   Other Topics Concern  . Not on file   Social History Narrative  . No narrative on file    ROS: Please see the HPI.  All other systems reviewed and negative.  PHYSICAL EXAM:  BP 114/64  Pulse 61  Ht 5' (1.524 m)  General: Well developed, well nourished, in no acute distress. Head:  Normocephalic and atraumatic. Neck: no JVD Lungs: Clear to auscultation and percussion. Heart: Normal S1 and S2.  No murmur, rubs or gallops.  Abdomen:  Normal bowel sounds; soft; non tender; no organomegaly Pulses: Pulses normal in all 4 extremities. Extremities: No clubbing or cyanosis. Bilateral R greater than L. Neurologic: Alert and oriented x 3.  EKG: Echo: Study Conclusions    - Left ventricle: The cavity size was normal. Wall thickness was     increased in a pattern of mild LVH. Systolic function was normal.     The estimated ejection fraction was in the range of 55% to 60%.     Wall motion was normal; there were no regional wall motion     abnormalities. Doppler parameters are consistent  with abnormal     left ventricular relaxation (grade 1 diastolic dysfunction).   - Aortic valve: Mild regurgitation.   - Mitral valve: Mild regurgitation.   - Left atrium: The atrium was moderately dilated.   - Pulmonary arteries: PA peak pressure: 36mm Hg (S).   - Pericardium, extracardiac: A trivial pericardial effusion was     identified.    ASSESSMENT AND PLAN:

## 2011-01-29 NOTE — Assessment & Plan Note (Signed)
Will need carotid doppler for follow up.

## 2011-01-29 NOTE — Assessment & Plan Note (Addendum)
Improved.  On amlodipine, with lower extremity edema, but dopplers are negative.  Monitor. Check BMET.

## 2011-02-08 ENCOUNTER — Encounter (INDEPENDENT_AMBULATORY_CARE_PROVIDER_SITE_OTHER): Payer: Medicare Other | Admitting: Obstetrics and Gynecology

## 2011-02-08 DIAGNOSIS — R350 Frequency of micturition: Secondary | ICD-10-CM

## 2011-02-08 DIAGNOSIS — N952 Postmenopausal atrophic vaginitis: Secondary | ICD-10-CM

## 2011-02-08 DIAGNOSIS — N3941 Urge incontinence: Secondary | ICD-10-CM

## 2011-02-08 DIAGNOSIS — R82998 Other abnormal findings in urine: Secondary | ICD-10-CM

## 2011-02-15 ENCOUNTER — Ambulatory Visit (INDEPENDENT_AMBULATORY_CARE_PROVIDER_SITE_OTHER): Payer: Medicare Other | Admitting: Internal Medicine

## 2011-02-15 ENCOUNTER — Encounter: Payer: Self-pay | Admitting: Internal Medicine

## 2011-02-15 VITALS — BP 120/64 | HR 62 | Temp 99.1°F

## 2011-02-15 DIAGNOSIS — R7309 Other abnormal glucose: Secondary | ICD-10-CM

## 2011-02-15 DIAGNOSIS — J40 Bronchitis, not specified as acute or chronic: Secondary | ICD-10-CM

## 2011-02-15 DIAGNOSIS — R635 Abnormal weight gain: Secondary | ICD-10-CM

## 2011-02-15 DIAGNOSIS — R739 Hyperglycemia, unspecified: Secondary | ICD-10-CM

## 2011-02-15 DIAGNOSIS — Z131 Encounter for screening for diabetes mellitus: Secondary | ICD-10-CM

## 2011-02-15 MED ORDER — AZITHROMYCIN 250 MG PO TABS: ORAL_TABLET | ORAL | Status: AC

## 2011-02-15 NOTE — Progress Notes (Signed)
  Subjective:    Patient ID: Meghan Oliver, female    DOB: Jul 31, 1941, 69 y.o.   MRN: [id number]  HPI History of Bipolar disorder treated by [name] and currently doing well. Has had mild bilateral tremor which has gotten worse over past several moths. Seems to be an intention tremor. Also, has slight cough with white sputum production. No fever.  [name] wants her to try Metformin. Has very high HDL and low LDL. Cholesterol checked Dec 2011 and will check again soon. Has had weight gain on Zyprexa.      Review of Systems  Constitutional: Positive for unexpected weight change.  Respiratory: Positive for cough.   Neurological: Positive for tremors.       Objective:   Physical Exam  HENT:  Mouth/Throat: No oropharyngeal exudate.        Boggy nasal mucosa  Cardiovascular: Normal rate, regular rhythm and normal heart sounds.   No murmur heard. Pulmonary/Chest: Effort normal and breath sounds normal. No respiratory distress. She has no wheezes. She has no rales.  Psychiatric: She has a normal mood and affect. Her behavior is normal. Judgment and thought content normal.   Pt refuses to weigh today.       Assessment & Plan:  1- Allergic Rhinits      Allergic Bronchitis  2- Intention Tremor 3- Weight gain likely secondary to Zyprexa - rule out AODM- draw Hbg AIC

## 2011-02-15 NOTE — Patient Instructions (Signed)
Come back to get fasting lipid panel in late May.  I will call Dr. Sandria Manly about the temor. We will check Hgb AIC today.

## 2011-02-16 ENCOUNTER — Telehealth: Payer: Self-pay | Admitting: *Deleted

## 2011-02-16 DIAGNOSIS — R739 Hyperglycemia, unspecified: Secondary | ICD-10-CM

## 2011-02-16 MED ORDER — METFORMIN HCL 500 MG PO TABS: 500.0000 mg | ORAL_TABLET | Freq: Two times a day (BID) | ORAL | Status: DC

## 2011-02-16 NOTE — Telephone Encounter (Signed)
Left message for pt to call office.  Appointment scheduled with Dr. Shelle Iron on Wednesday, May 30 at 11:15 for evaluation of possible sleep apnea. Rx for Metformin 500 mg (#60) po BID x 2 [facility]  HgA1C result was 5.1 which is WNL.

## 2011-02-16 NOTE — Telephone Encounter (Signed)
Spoke with pt about appointment with Dr. Shelle Iron and new Rx for Metformin.  Pt denied any questions.

## 2011-02-16 NOTE — Letter (Signed)
March 12, 2007    Meghan Oliver, M.D.  [address]  [place], [place] [address]   RE:  [name], [name]  MRN:  [id number]  /  DOB:  1940-10-27   Dear Meghan Oliver:   I am writing  a note to update you on what happened in the past few days  with our mutual patient, Meghan Oliver.   Meghan Oliver came to see me on an urgent basis on Friday 03/10/07,  complaining of severe anxiety, a sinking sensation in her stomach, and  the inability to function. She was taking Xanax XR 2 mg in the morning  as you prescribed, and Lunesta at nighttime. However, she did not see  much improvement. I gave her a prescription for hydroxyzine 25 mg to  take 1-2 daily hoping that it will help her through the weekend. On  Saturday, she called me stating that hydroxyzine had helped her some,  but had made her drowsy. She was concerned that she is not able to  function.   She has to be functional  to prepare for  Meghan Oliver on the  12th of this month. She was also concerned about the cost of Xanax XR  which is 160 dollars per 30 pills. Since she tolerated best the  lorazepam, I told her to take 1 or 2 of 0.5 mg lorazepam up to 4 times a  day and to contact you early next week for further management.   Normally I would refuse to manage a psychiatric patient  managed by a  psychiatrist, however I had to do it for [facility] for a variety of reasons.  [name] was instructed to follow up with you next week and to follow your  recommendations.    Sincerely,      Georgina Quint. Plotnikov, MD  Electronically Signed    AVP/MedQ  DD: 03/12/2007  DT: 03/13/2007  Job #: [id number]

## 2011-02-16 NOTE — Letter (Signed)
Feb 24, 2007    Georgina Quint. Plotnikov, MD  [address]  [place], [place] [address]   RE:  CAMARI, QUINTANILLA  MRN:  [id number]  /  DOB:  [birth year]   Dear Solon Augusta:   I am dictating to you since you are out of the office for the next 4 to  5 days.   You asked me to review Jabree Attaway's carotid duplexes and MRA.   First of all, the MR angiogram showed no significant stenosis.  This is  not a functional or physiological study, it mostly an anatomy study.  Clearly, it did not show any significant disease.  On the other hand,  her carotid exams are graded on physiological Doppler data.  Reviewing  her studies, she had a study done July 17, 2003.  At the time, her  velocities in her internal carotid arteries were only in the 50-cm per  second range.  Based on the criteria that we use, this would indicate  zero to 30% stenoses.  Four years later on Feb 08, 2007, her velocities  had increased, in particular her peak velocity in the mid right internal  carotid artery was 1.29 m per second, and in the proximal left internal  carotid artery was 1.26 m per second.  These clearly fall into the realm  of 40% to 59% stenoses (the criteria would be velocities greater than  110 cm per second with diastolic velocities less than 40 cm per second).   This is not a particularly rapid progression for studies separated by  about 4 years.  Again, the MRI data is more anatomical, and the Doppler  more physiologic.   The followup duplex in a year is reasonable.   She certainly does not meet any particular criteria at this point for  more aggressive workup or need for carotid endarterectomy.   If you need any further help interpreting these studies, do not hesitate  to contact me when you return.    Sincerely,      Noralyn Pick. Eden Emms, MD, [facility]  Electronically Signed    PCN/MedQ  DD: 02/24/2007  DT: 02/24/2007  Job #: [id number]

## 2011-02-16 NOTE — Assessment & Plan Note (Signed)
[facility]                            CARDIOLOGY OFFICE NOTE   Meghan, Oliver                       MRN:          [id number]  DATE:02/08/2007                            DOB:          10-22-1940    Ms. Meghan Oliver is in for a followup visit.  It has been quite some time  since I had seen her. Prior to seeing her today, she had Doppler studies  done. The preliminary study suggested a 0-39% RICA stenosis and 40-59%  LICA stenosis with mild increase in proximal LICA velocities.  There is  heterogeneous plaque in the bulb and bifurcation bilaterally.  The  vertebral arteries are patent bilaterally.  She has been virtually  asymptomatic and feeling well.  She is perhaps stressed a bit with [name]  retirement.  She otherwise has gotten along well.   CURRENT MEDICATIONS:  Include Effexor 75 mg three tablets daily,  Simvastatin 20 mg q.h.s., aspirin 81 mg daily, Aciphex 20 mg b.i.d.,  Valtrex 500 mg daily, Benicar 40/12.5 1/2 tablet daily, Lotrel 5/10 one  daily, Lorazepam 0.5 mg daily, Vivactil 500 mg p.o. b.i.d. and Neurontin  300 mg q.h.s.   PHYSICAL EXAMINATION:  She is an alert, oriented female in no acute  distress. Weight is 120 pounds.  There are soft carotid bruits at the  base of the neck bilaterally. There is also a systolic ejection murmur  that would be graded as 1-2/6 without any diastolic blow. There is some  mild transmission up into the lower parts of the carotid areas.  The  lung fields are clear. The abdomen is soft and I do not feel a widened  pulsation. Femoral pulses are intact bilaterally.  Distal pulses are  intact as well.   Her electrocardiogram demonstrates normal sinus rhythm, there is  borderline left atrial enlargement. This is probably not clinically  significant and has been noted previously.   Her most recent laboratory studies included a fasting TSH that was  normal. Cholesterol was 231 with an LDL of 87 and an HDL of  107.   IMPRESSION:  1. Hypertension, controlled.  2. Hypercholesterolemia on lipid lowering therapy.  3. Mild bilateral carotid disease as noted on the above Doppler      ultrasound.  4. Soft systolic ejection murmur previously noted.  5. Other medical problems as noted in the problem list of Dr.      Posey Rea.   PLAN:  1. We will discontinue her Zocor.  2. We will begin Crestor 20 mg daily.  3. I have instructed her in the possible side effects and we have      given her samples and she will start on a prescription if she      tolerates this well. If this goes well she will get a lipid and      liver profile in 6 weeks and I will see her back in followup in 8      weeks. Following this we will      try to initiate a possible Lipo-Medi profile to better identify her  specific particles including particle number and size.     Arturo Morton. Riley Kill, MD, [facility]     TDS/MedQ  DD: 02/08/2007  DT: 02/08/2007  Job #: [id number]

## 2011-02-19 NOTE — Assessment & Plan Note (Signed)
[facility]                           PRIMARY CARE OFFICE NOTE   Meghan Oliver, Meghan Oliver                       MRN:          [id number]  DATE:01/30/2007                            DOB:          04-Nov-1940    The patient is a 70 year old female who presents for a wellness  examination.   ALLERGIES:  NONE.   Past medical history, family history, social history as per September 01, 2005 note.   CURRENT MEDICATIONS:  She went off Vytorin, currently on Zocor 20 mg  daily. She stopped other vitamins, and remains on a multivitamin a day.  She went off Fosamax after 9 years of use.   REVIEW OF SYSTEMS:  Overall feeling well.  Stressed out with [employer]  retirement.  Not depressed. No weight loss or weight gain.  Has not been  exercising for a few months.  Occasional low back pain.  Chronic trouble  sleeping.  Over past week sick with sinus congestion, headaches,  tiredness.  Regular mammogram.  Regular GYN care with Dr. Eda Paschal.  The rest of the 18-point review of systems is negative.   PHYSICAL EXAMINATION:  Blood pressure 135/75.  Pulse 64.  Temperature  99.1.  Weight 120 pounds.  She looks well, younger than her stated age.  HEENT:  Moist mucosa.  Slightly erythematous throat.  NECK:  Supple.  No thyromegaly.  No bruit.  LUNGS:  Clear.  No wheeze or rales.  HEART:  S1 and S2.  No murmur.  No gallop.  ABDOMEN:  Soft and non-tender.  No organomegaly or mass felt.  LOWER EXTREMITIES:  Without edema.  She is alert, oriented, and cooperative.  She is tearful today.  Overall, positive.   Labs from January 30, 2007:  CBC normal.  CMET normal.  Cholesterol 213,  HDL is 107.8.  TSH normal.  Vitamin B12 617. Colonoscopy, CXR are up to  date.   ASSESSMENT AND PLAN:  1. Normal wellness examination.  Age/health-related issues discussed.      Regular GYN care with Dr. Eda Paschal.  Obtain vitamin D level in      today's serum specimen.  She is to take calcium and  vitamin D.      Mammogram yearly.  2. Hypertension.  Will change to Benicar 20/12.5 daily (was on Benicar      40/12.5 one half daily).  Continue Lotrel.  Well controlled.  3. Bipolar depression.  On current therapy, doing well.  4. Low back pain.  She is using gabapentin 300 mg nightly.  5. Insomnia.  Lorazepam 0.5 mg 2 nightly.  She has been off Ambien.  6. Acute sinusitis.  Prescribed Z-Pak for 5 days.  7. History of osteoporosis.  Will obtain bone density scan next year.      Calcium and vitamin D.  She went off Fosamax.  8. Dyslipidemia.  Followup Cardiology consult with Dr. Riley Kill to      decide on the need for      stress test.  9. Mild carotid stenosis.  Will obtain carotid Doppler ultrasound.     Georgina Quint.  Plotnikov, MD  Electronically Signed    AVP/MedQ  DD: 02/01/2007  DT: 02/01/2007  Job #: [id number]   cc:   Reuel Boom L. Eda Paschal, M.D.  Arturo Morton. Riley Kill, MD, [facility]

## 2011-02-19 NOTE — Op Note (Signed)
NAMETOBEY, LIPPARD NO.:  [id number]   MEDICAL RECORD NO.:  [id number]                   PATIENT TYPE:  INP   LOCATION:  0365                                 FACILITY:  [facility]   PHYSICIAN:  Lina Sar, M.D. [facility]               DATE OF BIRTH:  12-Jan-1941   DATE OF PROCEDURE:  02/12/2004  DATE OF DISCHARGE:                                 OPERATIVE REPORT   PROCEDURE:  Flexible sigmoidoscopy.   INDICATIONS:  This 70 year old white female was admitted with acute  diarrheal illness, dehydration for about 2-3 days duration.  CT scan of the  abdomen showed diffuse edema of the left colon including rectum, extending  through the transverse colon and possibly to the right colon as well.  The  stool was Hemoccult negative, and stool specimen could not be obtained  because of the incontinence and urgency which made it difficult to collect  the specimen.  She is undergoing flexible sigmoidoscopy to further assess  the nature of her colitis which is presumably infectious.   ENDOSCOPE:  Olympus single-channel video endoscope.   SEDATION:  1. Versed 6 mg IV.  2. Fentanyl 50 mcg IV.   FINDINGS:  Olympus single-channel video scope passed under direct vision  through rectum to sigmoid colon.  Patient was monitored by pulse oximeter.  Her oxygen saturations were normal.  She was very cooperative.  The  procedure was done in an unprepped colon.  Rectal tone was decreased.  Starting at the rectum, mucosa was somewhat edematous with loss of vascular  pattern with diffuse erythema.  Mucosa was coated with whitish specks of  stool but no pseudomembrane.  These changes were __________ and diffuse  throughout the rectum, sigmoid colon, and descending colon.  There were no  erosions or any focal mucosal lesions.  There were no diverticula.  Mucosa  was diffusely edematous but I was able to negotiate through the left colon  to sigmoid colon to the splenic flexure to the  level of 80 cm.  At that  point, procedure was terminated, and the colonoscope was slowly retracted.  Multiple random biopsies were taken from the left colon.  Also, semi-solid  stool specimen was collected from the left colon and sent to microbiology  for __________ cultures.  The patient tolerated the procedure well.   IMPRESSION:  Acute colitis, probably infectious.  Endoscopically, appears to  be a mild colitis, status post random biopsies and stool collection.   PLAN:  The patient will be continued on bowel rest, IV fluids, and Flagyl  250 mg q.i.d. as well as Cipro 400 mg IV q.12h.  We will also monitor her  chemistries and await results of the stool cultures as well as of the  biopsies.  Lina Sar, M.D. [facility]    DB/MEDQ  D:  02/12/2004  T:  02/12/2004  Job:  [id number]

## 2011-02-19 NOTE — Consult Note (Signed)
Meghan Oliver, VACCARELLA NO.:  [id number]   MEDICAL RECORD NO.:  [id number]                   PATIENT TYPE:  INP   LOCATION:  0365                                 FACILITY:  [facility]   PHYSICIAN:  Genene Churn. Love, M.D.                 DATE OF BIRTH:  1941-06-10   DATE OF CONSULTATION:  02/12/2004  DATE OF DISCHARGE:                                   CONSULTATION   REASON FOR CONSULTATION:  This 70 year old right-handed white married female  was admitted Feb 11, 2004 for evaluation of recurrent crampy abdominal pain  with diarrhea producing watery, foul-smelling stools.  She is now seen for  evaluation of tremor.   HISTORY OF PRESENT ILLNESS:  Meghan Oliver has been a patient of mine in the  past and examined October 01, 1999; August 02, 2000; and March 25, 2003 for  problems with right L4 radiculopathy secondary to neural foraminal stenosis  and mild anterior spondylolistheis of L4 on L5, Zocor-induced myopathy,  right foot pain possibly representing a Morton's neuroma, migraine  headaches, bipolar disease, osteoporosis, hypertension, and hyperlipidemia.  Prior to this admission she had been on Fosamax 70 mg once per week, folic  acid 1 mg once daily, Aciphex 20 mg daily, Valtrex 500 mg daily, Benicar  40/12.5 mg daily, Lotrel 5/10 one p.o. daily, Effexor XR  75 mg daily,  Depakote ER 500 mg daily,  vitamin C 500 mg daily, Caltrate + D 1200 mg  daily, vitamin E 400 international units daily, aspirin 81 mg daily,  Lamictal 150 mg daily, Ambien 10 mg q.h.s., lorazepam 0.5 mg t.i.d., and  Vytorin 10/20 daily.  She was admitted with a history of having had  relapsing diarrheal illness over several months initially treated with  Cipro, then Flagyl, and then repeated course of Cipro.  At the time of  admission she was mildly dehydrated and had a hemoglobin of 10.3 with a  hematocrit of 31.0 and white blood cell count of 4200 and platelets 190,000.  Other blood  studies in th hospital have included a valproic acid level of  32, sodium 134, potassium 3.8, chloride 109, CO2 content 24, glucose 82, BUN  13, creatinine 1.0, calcium 8.1.  Iron was less than 10, total iron binding  capacity not calculated, vitamin B12 level 1085.  A urinalysis with 2 white  blood cells and 2 red blood cells.  She has undergone a flexible  sigmoidoscopy and has had a CT scan of the abdomen and pelvis which showed  evidence of thickened left colon and rectum and transverse colon compatible  with acute colitis.  Her sigmoidoscopy revealed diffuse edema and erythema,  no mucosal lesions present; again, compatible with acute colitis.  It was  felt that this most likely was an infectious etiology.  The patient's tremor  has improved in the hospital off of Depakote.  She has no  known history of  thyroid disease.  She does have a positive family history of tremor.   PHYSICAL EXAMINATION:  GENERAL:  Revealed a well-developed, thin white  female.  Blood pressure right and left arm 120/80, heart rate was 92 and  regular.  There was a heart murmur which radiated to both carotids and to  the supraclavicular region bilaterally.  The neck flexion and extension  maneuvers were unremarkable.  Temperature was 98 degrees.  She has been  afebrile in the hospital.  NEUROLOGIC:  Mental status:  She was alert and oriented x3, followed one-,  two-, and three-step commands.  Her cranial nerve examination revealed a  mild left ptosis.  Visual fields were full, the discs were not examined.  Pupils were reactive and corneals were not checked.  Facial sensation was  equal.  There was no facial motor asymmetry.  Tongue was midline, uvula was  midline, gags were not evaluated.  Motor examination revealed good strength  in the upper and lower extremities, very mild outstretched hand and arm  postural tremor, no cogwheeling, no resting tremor noted, no increased tone.  Deep tendon reflexes 2+ and  sensory examination intact.   IMPRESSION:  1. Tremor, most likely secondary to Depakote, code 333.1 and 995.2.  2. Diarrhea, possibly on an infectious basis with acute colitis.  3. Right L4 radiculopathy, code 353.4.  4. History of migraine headaches, code 346.10.  5. Bipolar disease, code 296.7.  6. Osteoporosis, code unknown.  7. Hypertension, code 796.2.  8. Hyperlipidemia, code 272.4.   The plan at this time is to discontinue Depakote, obtain a T4 and TSH.                                               Genene Churn. Sandria Manly, M.D.    JML/MEDQ  D:  02/12/2004  T:  02/12/2004  Job:  [id number]

## 2011-02-19 NOTE — H&P (Signed)
[name], Oliver NO.:  [id number]   MEDICAL RECORD NO.:  [id number]                   PATIENT TYPE:  INP   LOCATION:  0365                                 [facility]ACILITY:  [facility]   PHYSICIAN:  Lina Sar, M.D. [facility]               DATE OF BIRTH:  13-Jun-1941   DATE OF ADMISSION:  02/11/2004  DATE OF DISCHARGE:                                HISTORY & PHYSICAL   CHIEF COMPLAINT:  Abdominal pain, diarrhea, and weakness.   HISTORY:  Meghan Oliver is a 70 year old white female, wife of Dr. Victorino Oliver, known  to Dr. Lina Sar and Dr. Posey Rea.  She presents at this time with acute  onset of rather diffuse, crampy abdominal pain and diarrhea with watery,  foul-smelling stools q.1h. over the past 14 hours or so.  She has had  progressive weakness and nausea and vomiting x 1 in the office.  She reports  a shaking chill last night.  She has not noted any melena or hematochezia.  In retrospect, she had had a relapsing diarrheal illness over the past  couple of months, initially treated with a course of Cipro x 4-5 days with  improvements in symptoms and then relapse which was treated with Flagyl x 10  days.  Her diarrhea subsided with this and then recurred this past weekend  while she was vacationing in [place].  This seemed to correlate with eating  some sushi as well.  She has become progressively worse now over the past 24  hours with significant diarrhea, is noted to be orthostatic and mildly  hypotensive in the office and admitted for hydration and supportive  management, further diagnostic work-up with concerns for an infectious  colitis and possible pseudomembranous colitis.  She did undergo anoscopy in  the office per Dr. Juanda Chance with purulent, malodorous stool but no  pseudomembranous noted, at least with anoscopy, and she is Hemoccult  negative.   The patient has not had any overseas travel and no other known infectious  exposures.  She does not have any history  of GI disease other than reflux.  She has had prior colonoscopy with Dr. Lina Sar last done in 1999, and  this was a negative exam.   CURRENT MEDICATIONS:  1. Fosamax 70 mg weekly.  2. Folic acid 1 mg daily.  3. Aciphex 20 daily.  4. Valtrex 500 mg daily.  5. Benicar 40/12.5 daily.  6. Lotrel 5/10, 1 p.o. daily.  7. Effexor XR 75 mg daily.  8. Depakote ER 500 mg daily.  9. Vitamin C 500 daily.  10.      Caltrate plus D 1200 daily.  11.      Vitamin E 400 daily.  12.      Aspirin 81 mg daily.  13.      Lamictal 150 mg daily.  14.      Ambien 10 mg q.h.s.  15.      Lorazepam 0.5 t.i.d. to q.i.d. p.r.n.  16.      VYTORIN 10/20 daily.   ALLERGIES:  No known drug allergies.   PAST MEDICAL HISTORY:  1. Hypertension.  2. Hyperlipidemia.  3. Osteoporosis.  4. GERD.  5. History of migraine headaches.  6. Heart murmur.  7. Carotid bruits followed by Dr. Riley Kill.  8. Depression with bipolar disorder.   FAMILY HISTORY:  Not obtained from the patient.  Currently she is sedated  post-antiemetic.   SOCIAL HISTORY:  The patient is married, wife of Dr. Victorino Oliver.  She  resides in [place] and also has a home in [place].  She has two grown  children.  She is a remote smoker.  No regular ETOH.   REVIEW OF SYSTEMS:  CARDIOVASCULAR:  Denies any chest pain or anginal  symptoms.  PULMONARY:  She does report some increased shortness of breath  over the past few days, no sputum production.  GI:  As outlined above.  NEUROLOGIC:  Pertinent for tremor which she associates with Depakote which  has been worse over the past couple of days.   PHYSICAL EXAMINATION PER DR. DORA BRODIE:  GENERAL:  The patient is a well-  developed white female, weak-appearing and ill but in no acute distress.  She is alert and oriented x 3.  She is tremulous.  VITAL SIGNS:  Blood pressure 82/42 sitting, 76/42 standing, 84/48 lying.  Pulse of 88.  Temperature was 98 in the office.  HEENT:  Normocephalic,  atraumatic.  EOMI.  PERRLA.  Sclerae are anicteric.  NECK:  Supple without nodes.  Jugular veins are visible but not distended.  LUNGS:  Clear to A&P.  CARDIOVASCULAR:  Slightly tachy regular rhythm with S1 and S2.  ABDOMEN:  Bowel sounds are present but hypoactive.  She is mildly distended,  rather diffusely tender with no focal tenderness, no guarding or rebound.  RECTAL:  On anoscopy, she is heme-negative with purulent, malodorous stool.  Rectal mucosa appears grossly normal.  EXTREMITIES:  Trace edema in the ankles.  Pulses are intact.  NEUROLOGIC:  Nonfocal.   LABORATORY STUDIES DONE IN THE OFFICE TODAY:  WBC 3.6, hemoglobin 11.2,  hematocrit 33.4, platelets 193.  She had 17.3% monos.  Electrolytes within  normal limits with a potassium of 3.8, albumin 3.  Depakote level is low at  32.   IMPRESSION:  6. A 70 year old white female with acute colitis infectious, rule out     pseudomembranous colitis versus bacterial.  Doubt ischemic colitis with     current presentation.  Doubt inflammatory bowel disease, rule out     possibly drug-related symptoms, i.e., secondary to Depakote toxicity,     though feel this is less likely as well.  2. Orthostatic hypotension secondary to volume depletion.  3. Mild dehydration secondary to above.  4. History of hypertension.  5. Hyperlipidemia, osteoporosis, depression/bipolar disorder and anxiety.  6. Gastroesophageal reflux disease.   PLAN:  The patient is admitted to the service of Dr. Lina Sar for IV  fluid hydration, stool cultures for enteric pathogens, C. difficile, stool  for WBCs, stool for O&P.  She will be started empirically on oral Flagyl,  and we will also add IV Cipro until her cultures return.  She will undergo a  CT scan of the abdomen and pelvis and then depending on her course, may  require a sigmoidoscopy.  For details, please see the orders.    Amy Esterwood, P.A.-C. [facility]  Lina Sar, M.D. [facility]    AE/MEDQ   D:  02/11/2004  T:  02/11/2004  Job:  [id number]

## 2011-02-19 NOTE — Discharge Summary (Signed)
Meghan Oliver, Meghan Oliver NO.:  [id number]   MEDICAL RECORD NO.:  [id number]                   PATIENT TYPE:  INP   LOCATION:  0365                                 FACILITY:  [facility]   PHYSICIAN:  Wilhemina Bonito. Marina Goodell, M.D. [facility]             DATE OF BIRTH:  July 20, 1941   DATE OF ADMISSION:  02/11/2004  DATE OF DISCHARGE:  02/15/2004                                 DISCHARGE SUMMARY   ADMISSION DIAGNOSES:  67. A 70 year old white female with acute colitis, suspect infectious.  Rule     out pseudomembranous colitis versus other bacterial colitis.  Doubt     ischemic colitis with current presentation.  Doubt inflammatory bowel     disease.  Rule out possible drug-induced symptoms, i.e., Depakote.  2. Orthostatic hypotension secondary to volume depletion.  3. Mild dehydration secondary to above.  4. History of hypertension.  5. Hyperlipidemia.  6. Osteoporosis.  7. History of depression, bipolar disorder, and anxiety.  8. Gastroesophageal reflux disease.   DISCHARGE DIAGNOSES:  1. Acute colitis, etiology unclear, rule out toxin-negative Clostridium     difficile, enterohemorrhagic Escherichia coli, or possibly resolving     ischemic colitis.  2. Tremor secondary to Depakote, now discontinued.  3. Hypokalemia, corrected.  4. Iron-deficiency anemia.  5. Cervical disk disease, multilevel with cord compression at C4-5 and C5-6.  6. Orthostatic hypotension secondary to volume depletion.  7. Mild dehydration secondary to above.  8. History of hypertension.  9. Hyperlipidemia.  10.      Osteoporosis.  11.      History of depression, bipolar disorder, and anxiety.  12.      Gastroesophageal reflux disease.   CONSULTATIONS:  1. Dr. Sandria Manly, neuro.  2. Dr. Roxan Hockey, infectious disease.   PROCEDURES:  1. Sigmoidoscopy with biopsies, Dr. Juanda Chance.  2. CT scan of the abdomen and pelvis.  3. Plain abdominal films.  4. MRI of the brain.  5. MRI of the cervical spine.   BRIEF HISTORY:  Meghan Oliver is a 70 year old white female, wife of Dr. Victorino Oliver,  known to Dr. Lina Oliver and Dr. Posey Oliver, who at this time presents with  acute onset of rather diffuse crampy abdominal pain and diarrhea with  watery, foul-smelling stools occurring q.1h. over the past 14 hours or so.  She has had associated weakness and nausea with vomiting x1 and reports  shaking chills.  She has not had any melena or hematochezia.  In retrospect,  she has been having a relapsing diarrheal illness over the past couple of  months, initially treated with a short course of Cipro with improvement in  symptoms and then relapsed and was treated with Flagyl x10 days.  Her  diarrhea had subsided after the Flagyl and then recurred this past weekend  while she was vacationing in [place].  She has become progressively worse, now,  over the past 24 hours with refractory diarrhea,  incontinence, and is noted  to be orthostatic and mildly hypotensive in the office and is admitted for  hydration, supportive management, and further diagnostic evaluation.   LABORATORY STUDIES:  On May 11, WBC 4.2, hemoglobin 10.3, hematocrit 31.0,  MCV 92.5, platelets 190.  On May 12, WBC 6.8, hemoglobin 9.9, hematocrit  29.2, platelets 194.  Sed rate of 20.  BUN on admission 25, creatinine 1.2.  Glucose 82.  TSH of 1.46.  Serum iron less than 10.  B12 1085.  Folate 654.  Urinalysis negative.  Stool for WBC:  Moderate WBCs present, predominantly  mononuclear.  Stool for C. diff negative.  Stool for EHEC negative.  Culture  negative.  O&P negative.  [facility] spotted fever titer negative.   X-RAY STUDIES:  1. Abdominal films on May 10 negative.  2. CT scan of the abdomen and pelvis on May 10:  Abnormal thickening of the     colon, especially from the mid transverse colon through the rectum,     consistent with an acute colitis.  Small-to-moderate amount of free     pelvic fluid.  No pneumatosis.  3. MRI of the brain:  No  evidence for acute event.  Very mild meningeal     enhancement.  Felt to be within normal limits.  Degenerative disk disease     of the cervical spine.  4. MR angiography:  There was some irregularity of the distal MCA and PCA     branches, consistent with intracranial atherosclerotic change.  5. MRI of the cervical spine:  Significant multilevel degenerative disk     disease due to disk space narrowing and subluxation and osteophyte     formation.  Significant cord compression of both C4-5 and C5-6.   HOSPITAL COURSE:  Patient was admitted to the service of Dr. Lina Oliver.  She was placed on IV fluids, clear-liquid diet, started empirically on oral  Flagyl.  Stool cultures were obtained.  We held her antihypertensive due to  orthostatic hypotension on admission.  She was also having a significant  tremor, which had been present for several months since starting Depakote.  Her Depakote was held.  She underwent CT scan of the abdomen and pelvis with  findings as outlined above, most consistent with an acute colitis.  Cipro  was added empirically to her regimen as well.  She had a flexible  sigmoidoscopy the following morning with Dr. Juanda Chance with findings again  consistent with an acute colitis of the left colon, at least to the splenic  flexure.  There was edema and loss of the vascular pattern.  No ulcerations,  pseudomembrane, or blood noted.  Biopsies were taken.  Endoscopically, her  colitis appeared to be mild.  Biopsies eventually returned showing acute  colitis with mucosal erosions and reactive regenerative epithelial changes.  Diagnosis was not clear.  Some features favored ischemic colitis; however,  resolving pseudomembranous colitis or colitis due to enterohemorrhagic E.  coli were considered as well.  She gradually improved over the next 48  hours, although felt quite ill initially and was having uncontrollable diarrhea.  We continued her on Cipro and Flagyl initially.  Then  the  decision was made to discontinue the Cipro and add vancomycin because of  concerns for a toxin-negative C. diff, given several recent courses of  outpatient antibiotics.  She also had Questran added to her regimen.  She  was seen in consultation by Dr. Sandria Manly of neurology.  He felt that the  Depakote was responsible for her tremor and discontinued it.  Also, ordered  MRI of her head and cervical spine with findings as outlined above.  She was  seen in consultation by Dr. Roxan Hockey for infectious disease.  He felt that  given atherosclerotic changes noted on CT and lack of any positive cultures,  that she may have an ischemic colitis.  It was felt possible that she may  have an overlap type of picture.   By May 13, she was feeling significantly better, able to eat.  Remained a  little sore in her abdomen but no significant cramping.  Her diarrhea was  significantly improved as well, although she still had a lot of urgency.   By May 14, she was felt stable for discharge and was allowed to discharge to  home with instructions to follow up with Dr. Lina Oliver, to follow a low-  residue diet until her diet resolved with liberal p.o. fluids.  Follow up  with Dr. Posey Oliver as routine and follow up with Dr. Sandria Manly on a p.r.n. basis.   She was discharged on Flagyl 250 q.i.d. to complete a two-week course, and  vancomycin 125 q.i.d. x10 days, and then 3 times daily for one week.  She  was discharged on Colestid b.i.d. on a p.r.n. basis until diarrhea resolved,  Robinul Forte 2 mg b.i.d. for spasm, Aciphex 20 mg q.d. as previous, Fosamax  as previous, Effexor XR 75 q.d., Nu-Iron 1 p.o. b.i.d., __________ 1 p.o.  q.d. x7 days, Lamictal 150 q.d., aspirin 81 mg q.d., vitamin E daily,  Caltrate plus D 1200 daily, vitamin C 500 q.d.  Again, Depakote was  discontinued.  Lotrel 5/10 1 p.o. q.d.  Benicar 40/12.5 q.d.  Valtrex 500  q.d.  Ambien 10 mg q.h.s. p.r.n.  Lorazepam 0.5 t.i.d. to q.i.d. on a  p.r.n.  basis.   CONDITION ON DISCHARGE:  Stable.     Amy Esterwood, P.A.-C. [facility]                John N. Marina Goodell, M.D. [facility]    AE/MEDQ  D:  03/05/2004  T:  03/05/2004  Job:  [id number]   cc:   Georgina Quint. Plotnikov, M.D. [facility]   Genene Churn. Love, M.D.  [address]  [address]  [place]  [place] [address]  Fax: [phone]

## 2011-02-23 ENCOUNTER — Encounter: Payer: Self-pay | Admitting: Pulmonary Disease

## 2011-02-26 ENCOUNTER — Other Ambulatory Visit: Payer: Medicare Other | Admitting: Internal Medicine

## 2011-02-26 DIAGNOSIS — E7800 Pure hypercholesterolemia, unspecified: Secondary | ICD-10-CM

## 2011-02-27 LAB — LIPID PANEL
Cholesterol: 218 mg/dL — ABNORMAL HIGH (ref 0–200)
LDL Cholesterol: 94 mg/dL (ref 0–99)
Total CHOL/HDL Ratio: 1.9 Ratio
Triglycerides: 49 mg/dL (ref <150)
VLDL: 10 mg/dL (ref 0–40)

## 2011-03-02 ENCOUNTER — Encounter: Payer: Self-pay | Admitting: Internal Medicine

## 2011-03-03 ENCOUNTER — Ambulatory Visit (INDEPENDENT_AMBULATORY_CARE_PROVIDER_SITE_OTHER): Payer: Medicare Other | Admitting: Pulmonary Disease

## 2011-03-03 ENCOUNTER — Encounter: Payer: Self-pay | Admitting: Pulmonary Disease

## 2011-03-03 VITALS — BP 100/62 | HR 54 | Temp 97.4°F | Ht 59.0 in

## 2011-03-03 DIAGNOSIS — G4710 Hypersomnia, unspecified: Secondary | ICD-10-CM

## 2011-03-03 NOTE — Patient Instructions (Signed)
Will set up for home sleep testing to evaluate for sleep apnea Will call you once the results are available Work on weight reduction.

## 2011-03-03 NOTE — Assessment & Plan Note (Signed)
The pt's history is suspicious for osa, but certainly not diagnostic.  She does have snoring and an abnormal breathing pattern during sleep, and does have some symptoms during the day that are not overwhelming.  I have had a long discussion with the pt about sleep apnea, including its impact on QOL and CV health.  Given her history, I think it would be appropriate to do sleep study for evaluation.  Given her lack of underlying cardiac disease, and suspicion for only mild osa, will set up for home sleep testing.

## 2011-03-03 NOTE — Progress Notes (Signed)
  Subjective:    Patient ID: Meghan Oliver, female    DOB: [birth year], 70 y.o.   MRN: [id number]  HPI The pt is a 70y/o female who I have been asked to see for possible osa.  Her history is significant for: -snoring and abnormal breathing pattern noted by husband during sleep. -nonrestorative sleep at least 50% of mornings, but feels better as morning progresses.  -Has some sleep pressure during the day with inactivity, but blames on "boredom". -gets sleepy at movies and may begin to snore.  Denies any sleepiness with driving. -weight up 20 pounds last 2 yrs, and epworth today is 8.  Sleep Questionnaire: What time do you typically go to bed?( Between what hours) 9 to 10 pm How long does it take you to fall asleep? 1/2 hour How many times during the night do you wake up? 2 What time do you get out of bed to start your day? 0830 Do you drive or operate heavy machinery in your occupation? No How much has your weight changed (up or down) over the past two years? (In pounds) 20 lb (9.072 kg) Have you ever had a sleep study before? No Do you currently use CPAP? No Do you wear oxygen at any time? No     Review of Systems  Constitutional: Positive for unexpected weight change. Negative for fever.  HENT: Negative for ear pain, nosebleeds, congestion, sore throat, rhinorrhea, sneezing, trouble swallowing, dental problem, postnasal drip and sinus pressure.   Eyes: Negative for redness and itching.  Respiratory: Positive for cough. Negative for chest tightness, shortness of breath and wheezing.   Cardiovascular: Negative for palpitations and leg swelling.  Gastrointestinal: Negative for nausea and vomiting.  Genitourinary: Negative for dysuria.  Musculoskeletal: Negative for joint swelling.  Skin: Negative for rash.  Neurological: Negative for headaches.  Hematological: Does not bruise/bleed easily.  Psychiatric/Behavioral: Positive for dysphoric mood. The patient is not nervous/anxious.          Objective:   Physical Exam Constitutional:  Well developed, no acute distress  HENT:  Nares narrowed bilat, with septal deviation to left  Oropharynx without exudate, palate and uvula are elongated.   Eyes:  Perrla, eomi, no scleral icterus  Neck:  No JVD, no TMG  Cardiovascular:  Normal rate, regular rhythm, no rubs or gallops.  3/6 rumbling systolic murmur        Intact distal pulses  Pulmonary :  Normal breath sounds, no stridor or respiratory distress   No rales, rhonchi, or wheezing  Abdominal:  Soft, nondistended, bowel sounds present.  No tenderness noted.   Musculoskeletal:  No lower extremity edema noted on left, minimal right ankle.  Lymph Nodes:  No cervical lymphadenopathy noted  Skin:  No cyanosis noted  Neurologic:  Alert, appropriate, moves all 4 extremities without obvious deficit.         Assessment & Plan:

## 2011-03-30 ENCOUNTER — Ambulatory Visit (INDEPENDENT_AMBULATORY_CARE_PROVIDER_SITE_OTHER): Payer: Medicare Other | Admitting: Pulmonary Disease

## 2011-03-30 DIAGNOSIS — G4710 Hypersomnia, unspecified: Secondary | ICD-10-CM

## 2011-03-31 ENCOUNTER — Telehealth: Payer: Self-pay | Admitting: Pulmonary Disease

## 2011-03-31 NOTE — Assessment & Plan Note (Signed)
The pt has moderate osa by her home sleep test.  Treatment options include weight loss, upper airway surgery, dental appliance, and cpap.  Will need ov to discuss results and possible treatment.

## 2011-03-31 NOTE — Telephone Encounter (Signed)
Called and spoke with pt.  Pt scheduled to see [facility] on MOnday 7/2 at 10:45 am

## 2011-03-31 NOTE — Telephone Encounter (Signed)
Pt has moderate osa.  Needs ov to discuss results.

## 2011-03-31 NOTE — Progress Notes (Signed)
The pt underwent home sleep testing with a type 3 device, with airflow/saturations/heart rate/effort all being monitored thru the night.  Her raw data and tracings have been reviewed, with the following findings:  1) flow evaluation period of 7hours and 2) 98 apneas and 106 hypopneas were noted, for an AHI 27/hr 3) there was desaturation as low as 79%

## 2011-04-02 ENCOUNTER — Telehealth: Payer: Self-pay | Admitting: Internal Medicine

## 2011-04-02 NOTE — Telephone Encounter (Signed)
RX written please fax to [facility] with prn one year refills.

## 2011-04-05 ENCOUNTER — Ambulatory Visit (INDEPENDENT_AMBULATORY_CARE_PROVIDER_SITE_OTHER): Payer: Medicare Other | Admitting: Pulmonary Disease

## 2011-04-05 ENCOUNTER — Encounter: Payer: Self-pay | Admitting: Pulmonary Disease

## 2011-04-05 VITALS — BP 110/66 | HR 50 | Temp 97.9°F | Ht 59.0 in

## 2011-04-05 DIAGNOSIS — G4733 Obstructive sleep apnea (adult) (pediatric): Secondary | ICD-10-CM

## 2011-04-05 NOTE — Progress Notes (Signed)
  Subjective:    Patient ID: Meghan Oliver, female    DOB: 1941/01/19, 70 y.o.   MRN: [id number]  HPI The pt comes in today for f/u of her recent home sleep study.  She was found to have an AHI 27/hr, and desat as low as 79%.  I have reviewed the study with her in detail, and answered all of her questions.     Review of Systems  Constitutional: Negative for fever and unexpected weight change.  HENT: Positive for postnasal drip. Negative for ear pain, nosebleeds, congestion, sore throat, rhinorrhea, sneezing, trouble swallowing, dental problem and sinus pressure.   Eyes: Negative for redness and itching.  Respiratory: Positive for cough. Negative for chest tightness, shortness of breath and wheezing.   Cardiovascular: Negative for palpitations and leg swelling.  Gastrointestinal: Negative for nausea and vomiting.  Genitourinary: Negative for dysuria.  Musculoskeletal: Positive for joint swelling.  Skin: Negative for rash.  Neurological: Negative for headaches.  Hematological: Does not bruise/bleed easily.  Psychiatric/Behavioral: Negative for dysphoric mood. The patient is not nervous/anxious.        Objective:   Physical Exam Wd female in nad LE without edema, no cyanosis noted. Alert, oriented, moves all 4        Assessment & Plan:

## 2011-04-05 NOTE — Patient Instructions (Addendum)
You have moderate sleep apnea, with abnormal breathing 27 times an hour during sleep. Will refer to Dr. Myrtis Ser for consultation for dental appliance, but you also need to work on modest weight loss Would like you to try chlorpheniramine 8mg  at bedtime for the next 2 weeks to see if your cough gets better.  Would increase your omeprazole to one in am and pm for next 2 weeks to see if cough improves Put your fish oil in freezer, and take from there.  Can contribute to cough. Please give me some feedback on how your visit with Dr. Myrtis Ser went, and whether you are going to get appliance.

## 2011-04-05 NOTE — Assessment & Plan Note (Signed)
The pt has moderate osa by her recent sleep test, and I have discussed the various treatment options with her.  These include a trial of weight loss alone for the next 6mos, verses dental appliance or cpap while working on weight loss.  I do not think surgery is in her best interest at this time.  She would like to consider a dental appliance, and will refer her to Dr. Althea Grimmer for evaluation.

## 2011-04-19 ENCOUNTER — Telehealth: Payer: Self-pay | Admitting: Pulmonary Disease

## 2011-04-19 NOTE — Telephone Encounter (Signed)
Let her know that a lot of dentist do dental appliances, but most of them do not do many.  Some only do one type while others do many different types and customize.   I know Dr. Myrtis Ser does many of them, and does different types.  I have no knowledge of her dentist, and he may do a very good job.  I just don't know.  If she feels comfortable with her dentist, she may want to try.

## 2011-04-19 NOTE — Telephone Encounter (Signed)
[email or web address]  

## 2011-04-19 NOTE — Telephone Encounter (Signed)
Pt states that she went to her regular dentist (Dr. Glean Hess) today and was mentioning the oral appliance for sleep apnea. Dr. Sharma Covert stated that he provides the mandible advanced device. Is this the same device as Dr. Myrtis Ser would provide? If so, pt would prefer to have Dr. Sharma Covert provide this. Please advise.

## 2011-04-20 NOTE — Telephone Encounter (Signed)
PATIENT RETURNED CALL.  HER QUESTION IS WHETHER HER MOUTH GUARD CAN BE MADE BY DENTIST OTHER THAN DR Myrtis Ser.  PLEASE CALL BACK

## 2011-04-20 NOTE — Telephone Encounter (Signed)
[email or web address]. Jayme Mednick, CMA  

## 2011-04-20 NOTE — Telephone Encounter (Signed)
I spoke with the pt and she states she has not heard anything from Dr. Myrtis Ser office. She wanted the address and phone number which I provided. I advised I will send a message to the [facility] to have them look into it to make sure order was sent. Carron Curie, CMA

## 2011-04-29 ENCOUNTER — Encounter: Payer: Self-pay | Admitting: Pulmonary Disease

## 2011-05-26 ENCOUNTER — Other Ambulatory Visit: Payer: Self-pay | Admitting: Cardiology

## 2011-06-14 ENCOUNTER — Other Ambulatory Visit: Payer: Medicare Other

## 2011-06-14 ENCOUNTER — Other Ambulatory Visit: Payer: Self-pay | Admitting: Gynecology

## 2011-06-14 DIAGNOSIS — R350 Frequency of micturition: Secondary | ICD-10-CM

## 2011-06-14 NOTE — Progress Notes (Signed)
Addended by: Landis Martins R on: 06/14/2011 04:17 PM   Modules accepted: Orders

## 2011-06-15 NOTE — Progress Notes (Signed)
Addended by: Landis Martins R on: 06/15/2011 09:14 AM   Modules accepted: Orders

## 2011-06-22 ENCOUNTER — Other Ambulatory Visit: Payer: Self-pay | Admitting: Pulmonary Disease

## 2011-06-22 ENCOUNTER — Ambulatory Visit
Admission: RE | Admit: 2011-06-22 | Discharge: 2011-06-22 | Disposition: A | Discharge status: Home / Self Care | Payer: Medicare Other | Source: Ambulatory Visit | Attending: Pulmonary Disease | Admitting: Pulmonary Disease

## 2011-06-22 DIAGNOSIS — R05 Cough: Secondary | ICD-10-CM

## 2011-06-22 MED ORDER — IOHEXOL 300 MG/ML  SOLN
75.0000 mL | Freq: Once | INTRAMUSCULAR | Status: AC | PRN
  Administered 2011-06-22: 75 mL via INTRAVENOUS

## 2011-07-05 ENCOUNTER — Encounter: Payer: Self-pay | Admitting: Internal Medicine

## 2011-07-26 ENCOUNTER — Ambulatory Visit (AMBULATORY_SURGERY_CENTER): Payer: Medicare Other

## 2011-07-26 VITALS — Ht 62.0 in | Wt 135.0 lb

## 2011-07-26 DIAGNOSIS — Z1211 Encounter for screening for malignant neoplasm of colon: Secondary | ICD-10-CM

## 2011-07-26 MED ORDER — PEG-KCL-NACL-NASULF-NA ASC-C 100 G PO SOLR: 1.0000 | Freq: Once | ORAL | Status: DC

## 2011-08-03 ENCOUNTER — Other Ambulatory Visit: Payer: Medicare Other | Admitting: Internal Medicine

## 2011-08-03 DIAGNOSIS — Z79899 Other long term (current) drug therapy: Secondary | ICD-10-CM

## 2011-08-03 LAB — TSH: TSH: 5.978 u[IU]/mL — ABNORMAL HIGH (ref 0.350–4.500)

## 2011-08-03 LAB — BASIC METABOLIC PANEL
Calcium: 9.7 mg/dL (ref 8.4–10.5)
Sodium: 142 mEq/L (ref 135–145)

## 2011-08-06 ENCOUNTER — Other Ambulatory Visit: Payer: Medicare Other | Admitting: Internal Medicine

## 2011-08-06 ENCOUNTER — Other Ambulatory Visit: Payer: Self-pay

## 2011-08-06 MED ORDER — LEVOTHYROXINE SODIUM 50 MCG PO TABS: 50.0000 ug | ORAL_TABLET | Freq: Every day | ORAL | Status: DC

## 2011-08-12 ENCOUNTER — Ambulatory Visit (INDEPENDENT_AMBULATORY_CARE_PROVIDER_SITE_OTHER): Payer: Medicare Other | Admitting: Pulmonary Disease

## 2011-08-12 ENCOUNTER — Encounter: Payer: Self-pay | Admitting: Pulmonary Disease

## 2011-08-12 VITALS — BP 102/60 | HR 52 | Temp 98.4°F | Ht 60.0 in | Wt 135.0 lb

## 2011-08-12 DIAGNOSIS — R05 Cough: Secondary | ICD-10-CM | POA: Insufficient documentation

## 2011-08-12 DIAGNOSIS — R059 Cough, unspecified: Secondary | ICD-10-CM

## 2011-08-12 NOTE — Progress Notes (Signed)
  Subjective:    Patient ID: Meghan Oliver, female    DOB: 01-Jan-1941, 70 y.o.   MRN: [id number]  HPI The patient is a 70 year old female who comes in today for evaluation of chronic cough.  She had no issues until June of this year, when her cough started spontaneously.  It is primarily dry, but sometimes can bring up scant quantities of mucus.  It has not been intensifying since the summer.  The patient states that it is worse at night when she lies down, and can awaken her from sleep.  It is of intermittent intensity, and she can go some days with no cough and other days have significant cough.  She has definite postnasal drip, but denies any facial or sinus pressure.  She is not blowing any purulent material from her nose.  She denies having a tickle in her throat, but does feel there is a sensation of something "being there".  She has a history of reflux, but denies having definite reflux symptoms.  She does clear her throat at times, but does not feel this is a significant problem.  She has no history of asthma, and feels that her breathing is normal.  She denies having significant cough paroxysms, though has had this in the past when her cough was worse.  She has had recent allergy testing that was unremarkable, and a CT of her chest that showed no significant process that would contribute to cough.   Review of Systems  Constitutional: Negative for fever and unexpected weight change.  HENT: Negative for ear pain, nosebleeds, congestion, sore throat, rhinorrhea, sneezing, trouble swallowing, dental problem, postnasal drip and sinus pressure.   Eyes: Negative for redness and itching.  Respiratory: Positive for cough. Negative for chest tightness, shortness of breath and wheezing.   Cardiovascular: Negative for palpitations and leg swelling.  Gastrointestinal: Negative for nausea and vomiting.  Genitourinary: Negative for dysuria.  Musculoskeletal: Negative for joint swelling.  Skin: Negative for  rash.  Neurological: Negative for headaches.  Hematological: Does not bruise/bleed easily.  Psychiatric/Behavioral: Negative for dysphoric mood. The patient is not nervous/anxious.        Objective:   Physical Exam Constitutional:  Well developed, no acute distress  HENT:  Nares patent without discharge  Oropharynx without exudate, palate and uvula are normal  Eyes:  Perrla, eomi, no scleral icterus  Neck:  No JVD, no TMG  Cardiovascular:  Normal rate, regular rhythm, no rubs or gallops.  No murmurs        Intact distal pulses  Pulmonary :  Normal breath sounds, no stridor or respiratory distress   No rales, rhonchi, or wheezing  Abdominal:  Soft, nondistended, bowel sounds present.  No tenderness noted.   Musculoskeletal:  No lower extremity edema noted.  Lymph Nodes:  No cervical lymphadenopathy noted  Skin:  No cyanosis noted  Neurologic:  Alert, appropriate, moves all 4 extremities without obvious deficit.         Assessment & Plan:

## 2011-08-12 NOTE — Assessment & Plan Note (Signed)
The patient has a chronic cough of 5 months duration which sounds more upper airway than lower in nature.  I have explained to her the common causes of upper airway cough include postnasal drip, reflux disease, and a cyclical cough mechanism.  I suspect she has issues with all 3.  She has had spirometry today which is normal.

## 2011-08-12 NOTE — Patient Instructions (Addendum)
Increase omeprazole 40mg  to am AND pm. Stop zyrtec, and take chlorpheniramine 8mg  at bedtime and lunch each day Take sudafed 60mg  one in am and pm Keep hard candy in mouth during waking hours, no mint or cough drops. No throat clearing.  Drink water or swallow if you feel you need to cough or clear throat. Please call me in 10-14 days to give update on how things are going.

## 2011-08-16 ENCOUNTER — Encounter: Payer: Self-pay | Admitting: Obstetrics and Gynecology

## 2011-08-16 ENCOUNTER — Encounter: Payer: Self-pay | Admitting: Internal Medicine

## 2011-08-31 ENCOUNTER — Ambulatory Visit (AMBULATORY_SURGERY_CENTER): Payer: Medicare Other | Admitting: Internal Medicine

## 2011-08-31 ENCOUNTER — Encounter: Payer: Self-pay | Admitting: Internal Medicine

## 2011-08-31 VITALS — BP 130/69 | HR 55 | Temp 98.7°F | Resp 18 | Ht 62.0 in | Wt 135.0 lb

## 2011-08-31 DIAGNOSIS — Z1211 Encounter for screening for malignant neoplasm of colon: Secondary | ICD-10-CM

## 2011-08-31 DIAGNOSIS — D126 Benign neoplasm of colon, unspecified: Secondary | ICD-10-CM

## 2011-08-31 DIAGNOSIS — K635 Polyp of colon: Secondary | ICD-10-CM

## 2011-08-31 MED ORDER — SODIUM CHLORIDE 0.9 % IV SOLN: 500.0000 mL | INTRAVENOUS | Status: DC

## 2011-08-31 NOTE — Patient Instructions (Signed)
Resume all medications.Information given on polyps. D/C Instructions completed.

## 2011-08-31 NOTE — Progress Notes (Signed)
Patient did not have preoperative order for IV antibiotic SSI prophylaxis. (G8918)Patient did not experience any of the following events: a burn prior to discharge; a fall within the facility; wrong site/side/patient/procedure/implant event; or a hospital transfer or hospital admission upon discharge from the facility. (G8907)Patient did not experience any of the following events: a burn prior to discharge; a fall within the facility; wrong site/side/patient/procedure/implant event; or a hospital transfer or hospital admission upon discharge from the facility. (G8907) 

## 2011-09-01 ENCOUNTER — Telehealth: Payer: Self-pay

## 2011-09-01 NOTE — Telephone Encounter (Signed)
Left message on answering machine. 

## 2011-09-02 ENCOUNTER — Other Ambulatory Visit: Payer: Medicare Other | Admitting: Internal Medicine

## 2011-09-02 DIAGNOSIS — E039 Hypothyroidism, unspecified: Secondary | ICD-10-CM

## 2011-09-03 ENCOUNTER — Encounter: Payer: Self-pay | Admitting: Internal Medicine

## 2011-09-03 ENCOUNTER — Ambulatory Visit (INDEPENDENT_AMBULATORY_CARE_PROVIDER_SITE_OTHER): Payer: Medicare Other | Admitting: Internal Medicine

## 2011-09-03 VITALS — BP 106/64 | HR 50 | Temp 98.7°F | Wt 132.0 lb

## 2011-09-03 DIAGNOSIS — E039 Hypothyroidism, unspecified: Secondary | ICD-10-CM

## 2011-09-03 DIAGNOSIS — I10 Essential (primary) hypertension: Secondary | ICD-10-CM

## 2011-09-03 DIAGNOSIS — R300 Dysuria: Secondary | ICD-10-CM

## 2011-09-03 LAB — POCT URINALYSIS DIPSTICK
Bilirubin, UA: NEGATIVE
Blood, UA: NEGATIVE
Glucose, UA: NEGATIVE
Spec Grav, UA: 1.015

## 2011-09-03 NOTE — Progress Notes (Signed)
  Subjective:    Patient ID: Meghan Oliver, female    DOB: 07/24/1941, 70 y.o.   MRN: [id number]  HPI patient here today to followup on hypothyroidism recently diagnosed. Currently on Synthroid 0.05 mg daily. TSH has improved with thyroid replacement therapy. Ideally we would like to see TSH around 1. Also, she has hypertension. Blood pressure is under excellent control on current regimen at 106/64. She's lost 4 pounds since December 2011 and she's pleased with that. Also she had colonoscopy earlier this week by Dr. Juanda Chance. She's complaining of UTI symptoms. Culture will be sent. She has LE on urinalysis. She got influenza immunization at the drugstore perhaps in October. She and her husband will be leaving in December to go to [place] for couple of months for the winter.    Review of Systems     Objective:   Physical Exam  no thyromegaly; chest clear to auscultation; cardiac exam regular rate and rhythm; extremities without edema        Assessment & Plan:  Hypothyroidism-TSH slightly over 2 on Synthroid 0.05 mg daily. Ideally would like TSH to be 1.00  Hypertension  Dysuria-abnormal urinalysis and culture is pending  Plan: Patient placed on Cipro 250 mg by mouth twice daily for 7 days. Increase Synthroid to 0.075 mg daily and recheck TSH when she returns from [place] perhaps around early March. Continue same antihypertensive medications. New prescription written for Synthroid 0.075 mg (#90 (1 by mouth daily with 2 refills for mail-order pharmacy and 30 day prescription for [facility] with one refill.  Plan to do physical examination when she returns from [place] early March 2013

## 2011-09-03 NOTE — Patient Instructions (Signed)
Please take a dose of Synthroid 0.075 mg daily. Please return in early March to have a thyroid functions rechecked. Continue same antihypertensive medications. You have been prescribed Cipro to take twice daily for urinary tract infection.

## 2011-09-06 ENCOUNTER — Encounter: Payer: Self-pay | Admitting: Internal Medicine

## 2011-09-07 ENCOUNTER — Institutional Professional Consult (permissible substitution): Payer: Medicare Other | Admitting: Critical Care Medicine

## 2011-10-15 ENCOUNTER — Other Ambulatory Visit: Payer: Self-pay

## 2011-10-15 DIAGNOSIS — I10 Essential (primary) hypertension: Secondary | ICD-10-CM

## 2011-10-15 MED ORDER — AMLODIPINE BESYLATE 10 MG PO TABS: 10.0000 mg | ORAL_TABLET | Freq: Every day | ORAL | Status: DC

## 2011-10-15 MED ORDER — LEVOTHYROXINE SODIUM 50 MCG PO TABS: 50.0000 ug | ORAL_TABLET | Freq: Every day | ORAL | Status: DC

## 2011-10-18 ENCOUNTER — Other Ambulatory Visit: Payer: Self-pay

## 2011-10-18 MED ORDER — LOSARTAN POTASSIUM 100 MG PO TABS: 100.0000 mg | ORAL_TABLET | Freq: Every day | ORAL | Status: DC

## 2011-10-18 MED ORDER — CHLORTHALIDONE 25 MG PO TABS: 25.0000 mg | ORAL_TABLET | Freq: Every day | ORAL | Status: DC

## 2011-10-18 MED ORDER — ROSUVASTATIN CALCIUM 20 MG PO TABS: 20.0000 mg | ORAL_TABLET | Freq: Every day | ORAL | Status: DC

## 2011-10-18 MED ORDER — POTASSIUM CHLORIDE CRYS ER 20 MEQ PO TBCR: 20.0000 meq | EXTENDED_RELEASE_TABLET | Freq: Every day | ORAL | Status: DC

## 2011-10-18 NOTE — Telephone Encounter (Signed)
Prescriptions faxed to [facility] at [phone].  [name] aware.

## 2011-11-01 ENCOUNTER — Other Ambulatory Visit: Payer: Self-pay | Admitting: *Deleted

## 2011-11-01 NOTE — Progress Notes (Signed)
Processed prior auth on Actonel 150mg . Per "Archie Patten" with [employer] patient is covered for one year.

## 2011-12-03 DIAGNOSIS — M5430 Sciatica, unspecified side: Secondary | ICD-10-CM | POA: Diagnosis not present

## 2011-12-03 DIAGNOSIS — M5137 Other intervertebral disc degeneration, lumbosacral region: Secondary | ICD-10-CM | POA: Diagnosis not present

## 2011-12-06 DIAGNOSIS — IMO0002 Reserved for concepts with insufficient information to code with codable children: Secondary | ICD-10-CM | POA: Diagnosis not present

## 2011-12-06 DIAGNOSIS — M5430 Sciatica, unspecified side: Secondary | ICD-10-CM | POA: Diagnosis not present

## 2011-12-08 DIAGNOSIS — IMO0002 Reserved for concepts with insufficient information to code with codable children: Secondary | ICD-10-CM | POA: Diagnosis not present

## 2011-12-08 DIAGNOSIS — M5430 Sciatica, unspecified side: Secondary | ICD-10-CM | POA: Diagnosis not present

## 2011-12-10 DIAGNOSIS — IMO0002 Reserved for concepts with insufficient information to code with codable children: Secondary | ICD-10-CM | POA: Diagnosis not present

## 2011-12-10 DIAGNOSIS — M5430 Sciatica, unspecified side: Secondary | ICD-10-CM | POA: Diagnosis not present

## 2011-12-13 DIAGNOSIS — IMO0002 Reserved for concepts with insufficient information to code with codable children: Secondary | ICD-10-CM | POA: Diagnosis not present

## 2011-12-13 DIAGNOSIS — M5430 Sciatica, unspecified side: Secondary | ICD-10-CM | POA: Diagnosis not present

## 2011-12-15 DIAGNOSIS — M5137 Other intervertebral disc degeneration, lumbosacral region: Secondary | ICD-10-CM | POA: Diagnosis not present

## 2011-12-15 DIAGNOSIS — M5430 Sciatica, unspecified side: Secondary | ICD-10-CM | POA: Diagnosis not present

## 2011-12-17 DIAGNOSIS — M5430 Sciatica, unspecified side: Secondary | ICD-10-CM | POA: Diagnosis not present

## 2011-12-17 DIAGNOSIS — IMO0002 Reserved for concepts with insufficient information to code with codable children: Secondary | ICD-10-CM | POA: Diagnosis not present

## 2011-12-20 DIAGNOSIS — M48062 Spinal stenosis, lumbar region with neurogenic claudication: Secondary | ICD-10-CM | POA: Diagnosis not present

## 2011-12-20 DIAGNOSIS — IMO0002 Reserved for concepts with insufficient information to code with codable children: Secondary | ICD-10-CM | POA: Diagnosis not present

## 2011-12-20 DIAGNOSIS — F341 Dysthymic disorder: Secondary | ICD-10-CM | POA: Diagnosis not present

## 2011-12-31 DIAGNOSIS — IMO0002 Reserved for concepts with insufficient information to code with codable children: Secondary | ICD-10-CM | POA: Diagnosis not present

## 2011-12-31 DIAGNOSIS — M48062 Spinal stenosis, lumbar region with neurogenic claudication: Secondary | ICD-10-CM | POA: Diagnosis not present

## 2012-01-11 DIAGNOSIS — M48061 Spinal stenosis, lumbar region without neurogenic claudication: Secondary | ICD-10-CM | POA: Diagnosis not present

## 2012-01-11 DIAGNOSIS — IMO0002 Reserved for concepts with insufficient information to code with codable children: Secondary | ICD-10-CM | POA: Diagnosis not present

## 2012-01-21 DIAGNOSIS — M48062 Spinal stenosis, lumbar region with neurogenic claudication: Secondary | ICD-10-CM | POA: Diagnosis not present

## 2012-01-21 DIAGNOSIS — IMO0002 Reserved for concepts with insufficient information to code with codable children: Secondary | ICD-10-CM | POA: Diagnosis not present

## 2012-02-04 DIAGNOSIS — F332 Major depressive disorder, recurrent severe without psychotic features: Secondary | ICD-10-CM | POA: Diagnosis not present

## 2012-02-10 ENCOUNTER — Other Ambulatory Visit: Payer: Self-pay | Admitting: Obstetrics and Gynecology

## 2012-02-14 DIAGNOSIS — F332 Major depressive disorder, recurrent severe without psychotic features: Secondary | ICD-10-CM | POA: Diagnosis not present

## 2012-02-21 DIAGNOSIS — M47817 Spondylosis without myelopathy or radiculopathy, lumbosacral region: Secondary | ICD-10-CM | POA: Diagnosis not present

## 2012-02-21 DIAGNOSIS — M549 Dorsalgia, unspecified: Secondary | ICD-10-CM | POA: Diagnosis not present

## 2012-02-21 DIAGNOSIS — M4310 Spondylolisthesis, site unspecified: Secondary | ICD-10-CM | POA: Diagnosis not present

## 2012-02-23 ENCOUNTER — Ambulatory Visit (INDEPENDENT_AMBULATORY_CARE_PROVIDER_SITE_OTHER): Payer: Medicare Other | Admitting: Obstetrics and Gynecology

## 2012-02-23 ENCOUNTER — Encounter: Payer: Self-pay | Admitting: Obstetrics and Gynecology

## 2012-02-23 VITALS — BP 112/60 | Ht 59.0 in | Wt 130.0 lb

## 2012-02-23 DIAGNOSIS — R350 Frequency of micturition: Secondary | ICD-10-CM

## 2012-02-23 DIAGNOSIS — F32A Depression, unspecified: Secondary | ICD-10-CM | POA: Insufficient documentation

## 2012-02-23 DIAGNOSIS — F329 Major depressive disorder, single episode, unspecified: Secondary | ICD-10-CM | POA: Insufficient documentation

## 2012-02-23 LAB — URINALYSIS W MICROSCOPIC + REFLEX CULTURE
Casts: NONE SEEN
Crystals: NONE SEEN
Glucose, UA: NEGATIVE mg/dL
RBC / HPF: NONE SEEN RBC/hpf (ref <3)
Specific Gravity, Urine: 1.02 (ref 1.005–1.030)
pH: 5 (ref 5.0–8.0)

## 2012-02-23 NOTE — Patient Instructions (Signed)
Stop Actonel. We will do a bone density in one year. Let me know if urinary frequency better on higher dose of Toviaz. We will let you know  about urine culture results.

## 2012-02-23 NOTE — Progress Notes (Addendum)
Patient came back to see me today for further followup. She has been on either Fosamax or Actonel for 8 years now. Her last bone density just showed osteopenia with significant improvement of bones. She has had no fractures. She does have a bulging lumbar disc and is doing physical therapy. She is having no abnormal bleeding vaginally. She does have atrophic vaginitis but is not sexually active but uses Vagifem before pelvic exams for comfort. We have been treating her for significant urinary frequency and urgency with Toviaz 4 mgm daily but problem has continued. She did have an abnormal urinalysis today. She does not have incontinence or dysuria. She has nocturia x2. She is having no pelvic pain. She is up-to-date on mammograms. She has had no fractures.  ROS: 12 system review done. Pertinent positives above. Other positives include depression, hyperlipidemia,GERD,hypertension, and obstructive sleep disorder.  Physical examination: Kennon Portela present. HEENT within normal limits. Neck: Thyroid not large. No masses. Supraclavicular nodes: not enlarged. Breasts: Examined in both sitting and lying  position. No skin changes and no masses. Abdomen: Soft no guarding rebound or masses or hernia. Pelvic: External: Within normal limits. BUS: Within normal limits. Vaginal:within normal limits. Poor estrogen effect. No evidence of cystocele rectocele or enterocele. Cervix: clean. Uterus: Normal size and shape. Adnexa: No masses. Rectovaginal exam: Confirmatory and negative. Extremities: Within normal limits.  Assessment: #1. Detrussor instability #2. Osteoporosis now significantly improved #3. Atrophic vaginitis #4. Possible urinary tract infection  Plan: We will wait for urine culture to treat. We increased her toviaz  to 8 mg daily-samples given. We had a 25 minute discussion of pros and cons of continuing Actonel. She understands there are no guarantees. We discussed  atypical fractures. She will stop her  Actonel and we will recheck her bone density in one year. She will continue yearly mammograms.  Patient's urinalysis showed 20,000 Klebsiella. We will treat her with Keflex 500 mg twice a day for 7 days and repeat culture.

## 2012-02-26 LAB — URINE CULTURE

## 2012-02-29 ENCOUNTER — Telehealth: Payer: Self-pay | Admitting: *Deleted

## 2012-02-29 DIAGNOSIS — N390 Urinary tract infection, site not specified: Secondary | ICD-10-CM

## 2012-02-29 MED ORDER — CEPHALEXIN 500 MG PO CAPS: 500.0000 mg | ORAL_CAPSULE | Freq: Two times a day (BID) | ORAL | Status: AC

## 2012-02-29 NOTE — Telephone Encounter (Signed)
Message copied by Libby Maw on Tue Feb 29, 2012 10:31 AM ------      Message from: Trellis Paganini      Created: Tue Feb 29, 2012  8:47 AM       Patient's urine culture showed mild infection. Please treat with Keflex 500 mg twice a day for 7 days. She needs follow up culture after above.

## 2012-02-29 NOTE — Telephone Encounter (Signed)
Lm for patient to call for results  

## 2012-02-29 NOTE — Telephone Encounter (Signed)
Patient informed. And order in pc for follow up.

## 2012-03-06 ENCOUNTER — Telehealth: Payer: Self-pay | Admitting: *Deleted

## 2012-03-06 MED ORDER — FESOTERODINE FUMARATE ER 8 MG PO TB24: 8.0000 mg | ORAL_TABLET | Freq: Every day | ORAL | Status: DC

## 2012-03-06 NOTE — Telephone Encounter (Signed)
Pt nurse [name] called stating pt is doing well on toviaz 8 mg 1 po daily samples. She would like rx for this, per OV okay to send rx.

## 2012-03-16 ENCOUNTER — Other Ambulatory Visit: Payer: Medicare Other

## 2012-03-16 ENCOUNTER — Encounter: Payer: Medicare Other | Admitting: Obstetrics and Gynecology

## 2012-03-16 DIAGNOSIS — N390 Urinary tract infection, site not specified: Secondary | ICD-10-CM | POA: Diagnosis not present

## 2012-03-17 LAB — URINALYSIS W MICROSCOPIC + REFLEX CULTURE
Crystals: NONE SEEN
Ketones, ur: NEGATIVE mg/dL
Nitrite: NEGATIVE
Specific Gravity, Urine: 1.023 (ref 1.005–1.030)
Urobilinogen, UA: 0.2 mg/dL (ref 0.0–1.0)

## 2012-03-20 ENCOUNTER — Telehealth: Payer: Self-pay | Admitting: *Deleted

## 2012-03-20 ENCOUNTER — Observation Stay (HOSPITAL_COMMUNITY)
Admission: EM | Admit: 2012-03-20 | Discharge: 2012-03-21 | Disposition: A | Discharge status: Home / Self Care | Payer: Medicare Other | Attending: Internal Medicine | Admitting: Internal Medicine

## 2012-03-20 ENCOUNTER — Other Ambulatory Visit: Payer: Self-pay

## 2012-03-20 ENCOUNTER — Other Ambulatory Visit: Payer: Self-pay | Admitting: Obstetrics and Gynecology

## 2012-03-20 ENCOUNTER — Encounter (HOSPITAL_COMMUNITY): Payer: Self-pay | Admitting: *Deleted

## 2012-03-20 DIAGNOSIS — E785 Hyperlipidemia, unspecified: Secondary | ICD-10-CM | POA: Diagnosis not present

## 2012-03-20 DIAGNOSIS — N390 Urinary tract infection, site not specified: Secondary | ICD-10-CM

## 2012-03-20 DIAGNOSIS — E039 Hypothyroidism, unspecified: Secondary | ICD-10-CM | POA: Diagnosis not present

## 2012-03-20 DIAGNOSIS — R0602 Shortness of breath: Secondary | ICD-10-CM | POA: Insufficient documentation

## 2012-03-20 DIAGNOSIS — I10 Essential (primary) hypertension: Secondary | ICD-10-CM | POA: Diagnosis not present

## 2012-03-20 DIAGNOSIS — R42 Dizziness and giddiness: Secondary | ICD-10-CM | POA: Insufficient documentation

## 2012-03-20 DIAGNOSIS — R55 Syncope and collapse: Secondary | ICD-10-CM | POA: Diagnosis not present

## 2012-03-20 DIAGNOSIS — E876 Hypokalemia: Secondary | ICD-10-CM | POA: Diagnosis not present

## 2012-03-20 DIAGNOSIS — K219 Gastro-esophageal reflux disease without esophagitis: Secondary | ICD-10-CM | POA: Diagnosis present

## 2012-03-20 DIAGNOSIS — F319 Bipolar disorder, unspecified: Secondary | ICD-10-CM | POA: Diagnosis not present

## 2012-03-20 DIAGNOSIS — I498 Other specified cardiac arrhythmias: Secondary | ICD-10-CM | POA: Insufficient documentation

## 2012-03-20 DIAGNOSIS — F32A Depression, unspecified: Secondary | ICD-10-CM | POA: Diagnosis present

## 2012-03-20 DIAGNOSIS — R001 Bradycardia, unspecified: Secondary | ICD-10-CM

## 2012-03-20 DIAGNOSIS — F329 Major depressive disorder, single episode, unspecified: Secondary | ICD-10-CM | POA: Diagnosis present

## 2012-03-20 LAB — BASIC METABOLIC PANEL
BUN: 29 mg/dL — ABNORMAL HIGH (ref 6–23)
CO2: 23 mEq/L (ref 19–32)
Calcium: 9.3 mg/dL (ref 8.4–10.5)
Creatinine, Ser: 0.93 mg/dL (ref 0.50–1.10)
Glucose, Bld: 94 mg/dL (ref 70–99)

## 2012-03-20 LAB — DIFFERENTIAL
Eosinophils Absolute: 0.2 10*3/uL (ref 0.0–0.7)
Eosinophils Relative: 3 % (ref 0–5)
Lymphocytes Relative: 30 % (ref 12–46)
Lymphs Abs: 2.1 10*3/uL (ref 0.7–4.0)
Monocytes Absolute: 0.5 10*3/uL (ref 0.1–1.0)
Monocytes Relative: 7 % (ref 3–12)

## 2012-03-20 LAB — CBC
HCT: 33.6 % — ABNORMAL LOW (ref 36.0–46.0)
MCH: 31.4 pg (ref 26.0–34.0)
MCV: 94.9 fL (ref 78.0–100.0)
RBC: 3.54 MIL/uL — ABNORMAL LOW (ref 3.87–5.11)
WBC: 7.1 10*3/uL (ref 4.0–10.5)

## 2012-03-20 MED ORDER — ACETAMINOPHEN 325 MG PO TABS: 650.0000 mg | ORAL_TABLET | ORAL | Status: DC | PRN

## 2012-03-20 MED ORDER — CIPROFLOXACIN HCL 500 MG PO TABS: 500.0000 mg | ORAL_TABLET | Freq: Two times a day (BID) | ORAL | Status: DC

## 2012-03-20 MED ORDER — LITHIUM CARBONATE 300 MG PO CAPS
300.0000 mg | ORAL_CAPSULE | Freq: Every day | ORAL | Status: DC
  Administered 2012-03-20: 300 mg via ORAL
  Filled 2012-03-20 (×2): qty 1

## 2012-03-20 MED ORDER — CELECOXIB 200 MG PO CAPS
200.0000 mg | ORAL_CAPSULE | ORAL | Status: DC | PRN
  Filled 2012-03-20: qty 1

## 2012-03-20 MED ORDER — LITHIUM CARBONATE 150 MG PO CAPS: 150.0000 mg | ORAL_CAPSULE | ORAL | Status: DC

## 2012-03-20 MED ORDER — TOPIRAMATE 25 MG PO TABS
50.0000 mg | ORAL_TABLET | Freq: Every day | ORAL | Status: DC
  Administered 2012-03-20: 50 mg via ORAL
  Filled 2012-03-20 (×2): qty 2

## 2012-03-20 MED ORDER — NITROGLYCERIN 0.4 MG SL SUBL: 0.4000 mg | SUBLINGUAL_TABLET | SUBLINGUAL | Status: DC | PRN

## 2012-03-20 MED ORDER — ONDANSETRON HCL 4 MG/2ML IJ SOLN: 4.0000 mg | Freq: Four times a day (QID) | INTRAMUSCULAR | Status: DC | PRN

## 2012-03-20 MED ORDER — PANTOPRAZOLE SODIUM 40 MG PO TBEC: 80.0000 mg | DELAYED_RELEASE_TABLET | Freq: Every day | ORAL | Status: DC

## 2012-03-20 MED ORDER — SODIUM CHLORIDE 0.9 % IV SOLN
INTRAVENOUS | Status: DC
  Administered 2012-03-20 (×2): via INTRAVENOUS

## 2012-03-20 MED ORDER — ASPIRIN 300 MG RE SUPP: 300.0000 mg | RECTAL | Status: AC

## 2012-03-20 MED ORDER — FLUOXETINE HCL 40 MG PO CAPS: 40.0000 mg | ORAL_CAPSULE | Freq: Every day | ORAL | Status: DC

## 2012-03-20 MED ORDER — ZOLPIDEM TARTRATE 5 MG PO TABS
5.0000 mg | ORAL_TABLET | Freq: Every evening | ORAL | Status: DC | PRN
  Administered 2012-03-20: 5 mg via ORAL
  Filled 2012-03-20: qty 1

## 2012-03-20 MED ORDER — FESOTERODINE FUMARATE ER 8 MG PO TB24
8.0000 mg | ORAL_TABLET | Freq: Every day | ORAL | Status: DC
  Filled 2012-03-20 (×2): qty 1

## 2012-03-20 MED ORDER — CIPROFLOXACIN HCL 500 MG PO TABS
500.0000 mg | ORAL_TABLET | Freq: Two times a day (BID) | ORAL | Status: DC
  Administered 2012-03-20 – 2012-03-21 (×2): 500 mg via ORAL
  Filled 2012-03-20 (×4): qty 1

## 2012-03-20 MED ORDER — LITHIUM CARBONATE 150 MG PO CAPS
150.0000 mg | ORAL_CAPSULE | Freq: Every day | ORAL | Status: DC
  Administered 2012-03-21: 150 mg via ORAL
  Filled 2012-03-20 (×2): qty 1

## 2012-03-20 MED ORDER — ASPIRIN 81 MG PO CHEW: 324.0000 mg | CHEWABLE_TABLET | ORAL | Status: DC

## 2012-03-20 MED ORDER — POLYETHYLENE GLYCOL 3350 17 G PO PACK
17.0000 g | PACK | Freq: Every day | ORAL | Status: DC | PRN
  Filled 2012-03-20: qty 1

## 2012-03-20 MED ORDER — LEVOTHYROXINE SODIUM 75 MCG PO TABS
75.0000 ug | ORAL_TABLET | Freq: Every day | ORAL | Status: DC
  Administered 2012-03-21: 75 ug via ORAL
  Filled 2012-03-20 (×2): qty 1

## 2012-03-20 MED ORDER — FLUOXETINE HCL 20 MG PO CAPS
40.0000 mg | ORAL_CAPSULE | Freq: Every day | ORAL | Status: DC
  Administered 2012-03-21: 40 mg via ORAL
  Filled 2012-03-20: qty 2

## 2012-03-20 MED ORDER — ASPIRIN 81 MG PO CHEW
324.0000 mg | CHEWABLE_TABLET | ORAL | Status: AC
  Administered 2012-03-20: 324 mg via ORAL
  Filled 2012-03-20: qty 4

## 2012-03-20 MED ORDER — OLANZAPINE 2.5 MG PO TABS
2.5000 mg | ORAL_TABLET | Freq: Every day | ORAL | Status: DC
  Administered 2012-03-21: 2.5 mg via ORAL
  Filled 2012-03-20: qty 1

## 2012-03-20 MED ORDER — ASPIRIN EC 81 MG PO TBEC: 81.0000 mg | DELAYED_RELEASE_TABLET | Freq: Every day | ORAL | Status: DC

## 2012-03-20 MED ORDER — FESOTERODINE FUMARATE ER 8 MG PO TB24: 8.0000 mg | ORAL_TABLET | Freq: Every day | ORAL | Status: DC

## 2012-03-20 MED ORDER — ATORVASTATIN CALCIUM 40 MG PO TABS
40.0000 mg | ORAL_TABLET | Freq: Every day | ORAL | Status: DC
  Filled 2012-03-20: qty 1

## 2012-03-20 MED ORDER — ASPIRIN 300 MG RE SUPP: 300.0000 mg | RECTAL | Status: DC

## 2012-03-20 MED ORDER — OMEGA-3-ACID ETHYL ESTERS 1 G PO CAPS
1.0000 g | ORAL_CAPSULE | Freq: Every day | ORAL | Status: DC
  Administered 2012-03-21: 1 g via ORAL
  Filled 2012-03-20: qty 1

## 2012-03-20 NOTE — Telephone Encounter (Signed)
Nurse kim (pt nurse) called requesting toviaz 8 mg 90 day supply sent to [facility], rx sent. Nurse kim informed

## 2012-03-20 NOTE — ED Notes (Signed)
Pt in room resting on stretcher. No c/o discomfort or pain at this time.

## 2012-03-20 NOTE — ED Notes (Signed)
Pt ambulatory with minimal assistance to the bathroom. Sts she did not feel light headed or dizzy.

## 2012-03-20 NOTE — ED Notes (Signed)
Will do orthostatic vs after echo. MD at bedside doing echo.

## 2012-03-20 NOTE — ED Provider Notes (Signed)
Medical screening exam: Patient had lightheadedness when she got out of her car this afternoon. She also discussed her home and laid down. Since then, she gets lightheaded anytime she stands up. Her husband who is a physician checked her pulse and blood pressure noted that the pulse was thready and blood pressure was 90/60 she denies chest pain, heaviness, tightness, pressure. There was mild dyspnea. There is no diaphoresis. She was started on a beta blocker 5 days ago which is propanolol which she takes twice a day. This was prescribed for tremor. Physical exam shows her bradycardia with normal blood pressure. Lungs are clear and heart has regular rate and rhythm which is bradycardic. She is no cyanosis or edema. ECG shows sinus bradycardia with a heart rate of 48. Review of her medications shows no other agents which should cause low blood pressure or bradycardia.  Dione Booze, MD 03/20/12 [phone]

## 2012-03-20 NOTE — H&P (Signed)
Patient ID: KEMISHA BONNETTE MRN: [id number], DOB/AGE: 72-09-1941   Admit date: 03/20/2012   Primary Physician: Margaree Mackintosh, MD Primary Cardiologist: Riley Kill   Problem List: Past Medical History  Diagnosis Date  . GERD (gastroesophageal reflux disease)   . Hyperlipidemia   . Anxiety   . Low back pain   . Osteoporosis   . Carotid disease, bilateral     carotid dopplers 12/10 with 40-59% bilateral ICA stenosis  . Bipolar disease, chronic   . Hypertension   . Depression     Past Surgical History  Procedure Date  . Esophagogastroduodenoscopy 02-26-04  . Appendectomy 1971  . Tubal ligation   . Augmentation mammaplasty   . Abdominal surgery     Laparotomy Ovarian cystctomy     Allergies: No Known Allergies  HPI:  Patient is a 71 year old who presents to the emergency room with SOB and presyncope. The patient and her family note that over the past few days she has had some SOB with exertion.  She has also been dizzy with presyncope at times.   Denies CP  No palpitations.  No syncope Of note about 5 days ago she was started on propranolol for essential tremors (10 at 8AM and Noon; prescribed by J Love. Also, patient with recent long distance travel.  Flew back from [place] last week.   Inpatient Medications:    . aspirin  324 mg Oral NOW   Or  . aspirin  300 mg Rectal NOW  . aspirin EC  81 mg Oral Daily    Family History  Problem Relation Age of Onset  . Heart attack Father 50    deceased  . Hypertension Father   . Heart disease Father   . Breast cancer Paternal Grandmother     Age unknown  . Breast cancer Paternal Aunt     Age 55's  . Colon cancer Neg Hx      History   Social History  . Marital Status: Married    Spouse Name: Dr. Victorino Dike    Number of Children: 2  . Years of Education: N/A   Occupational History  . housewife    Social History Main Topics  . Smoking status: Former Smoker -- 2.0 packs/day for 15 years    Types: Cigarettes    Quit  date: 01/13/1971  . Smokeless tobacco: Never Used  . Alcohol Use: 2.0 oz/week    4 drink(s) per week     wine  . Drug Use: No  . Sexually Active: No   Other Topics Concern  . Not on file   Social History Narrative  . No narrative on file     Review of Systems: All other systems reviewed and are otherwise negative except as noted above.  Physical Exam: Filed Vitals:   03/20/12 1718  BP: 122/61  Pulse: 49  Temp:   Resp:    No intake or output data in the 24 hours ending 03/20/12 1722  General: Well developed, well nourished, in no acute distress. Head: Normocephalic, atraumatic, sclera non-icteric Neck: Negative for carotid bruits. JVP not elevated. Lungs: Clear bilaterally to auscultation without wheezes, rales, or rhonchi. Breathing is unlabored. Heart: RRR with S1 S2. No murmurs, rubs, or gallops appreciated. Abdomen: Soft, non-tender, non-distended with normoactive bowel sounds. No hepatomegaly. No rebound/guarding. No obvious abdominal masses. Msk:  Strength and tone appears normal for age. Extremities: No clubbing, cyanosis or edema.  Distal pedal pulses are 2+ and equal bilaterally. Neuro: Alert  and oriented X 3. Moves all extremities spontaneously. Psych:  Responds to questions appropriately with a normal affect.  Labs: No results found for this or any previous visit (from the past 24 hour(s)).  Radiology/Studies: No results found.  EKG:  Sinus bradycardia  48 bpm.  T wave inversion III, V1-V3  Anterior T wave changes are more prominent than on EKG from 2011  Bedside echo:  Normal LV and RV size and function.  No regional wall motion abnormalities.  Mild AI.  No pericardial effusion.  ASSESSMENT AND PLAN:   Patient is a 71 year old who presents with a couple day history of dizziness, presyncope and also SOB on exertion.  Exam signif for bradycardia with HR in 40s.  SBP in low 100s.  Patient is not orthostatic. Echo with normal LV and RV size and function. EKG  without signif acute changes, T wave inversion is slightly more prominent.  I would recomm admitting to telemetry to follow.  Above may be due to recent addtion of inderal.  Should wash out.   Would give a small amount IV fluid.  Hold BP meds in am until assessed. Check CK/MB and Trop.   D dimer is negative.   Walk with assistance and follow symptoms.  2.  HTN  Follow Hold meds  3.  Dyslip  Continue statin  4.  CV disease:  Will f/u as outpatient  5.  Hypothyroidism:  Check TSH  6.  Psych:  Continue meds  7.  UTI:  Start ABX as presribed.     Signed, Dietrich Pates 03/20/2012, 5:22 PM ]

## 2012-03-20 NOTE — Progress Notes (Signed)
Pt ambulated 550 ft and tolerated walk well. Pt hearts rate remained in low 60s during walk. No O2 pr assistive device used. Pt returned to bed with call bell within reach and vital signs stable. Will continue to monitor. Steva Colder Bianca 8:33 PM 03/20/2012

## 2012-03-20 NOTE — ED Notes (Signed)
Cardiology in room at this time. Pt changed into gown, monitors in place, family at bed side

## 2012-03-20 NOTE — ED Notes (Signed)
Pt felt like she was going to pass out.  HR in the 40s.  Pt just started Propanolol about 4 days ago.  Pt felt sob when going up steps.  No chest pain

## 2012-03-21 DIAGNOSIS — E039 Hypothyroidism, unspecified: Secondary | ICD-10-CM

## 2012-03-21 DIAGNOSIS — R001 Bradycardia, unspecified: Secondary | ICD-10-CM

## 2012-03-21 DIAGNOSIS — I10 Essential (primary) hypertension: Secondary | ICD-10-CM | POA: Diagnosis not present

## 2012-03-21 DIAGNOSIS — N390 Urinary tract infection, site not specified: Secondary | ICD-10-CM

## 2012-03-21 DIAGNOSIS — R55 Syncope and collapse: Secondary | ICD-10-CM

## 2012-03-21 LAB — CARDIAC PANEL(CRET KIN+CKTOT+MB+TROPI)
Relative Index: INVALID (ref 0.0–2.5)
Relative Index: INVALID (ref 0.0–2.5)
Total CK: 55 U/L (ref 7–177)
Troponin I: <0.3 ng/mL (ref <0.30)

## 2012-03-21 MED ORDER — POTASSIUM CHLORIDE CRYS ER 20 MEQ PO TBCR
40.0000 meq | EXTENDED_RELEASE_TABLET | Freq: Once | ORAL | Status: AC
  Administered 2012-03-21: 40 meq via ORAL
  Filled 2012-03-21: qty 2

## 2012-03-21 MED ORDER — CIPROFLOXACIN HCL 500 MG PO TABS: 250.0000 mg | ORAL_TABLET | Freq: Two times a day (BID) | ORAL | Status: AC

## 2012-03-21 NOTE — Discharge Instructions (Signed)
PLEASE REMEMBER TO BRING ALL OF YOUR MEDICATIONS TO EACH OF YOUR FOLLOW-UP OFFICE VISITS.  PLEASE ATTEND ALL FOLLOW-UP APPOINTMENTS AS SCHEDULED.   PLEASE HOLD OFF ON TAKING ALL BP MEDICATIONS (NORVASC, HYGROTON AND COZAAR) TODAY AND TOMORROW MORNING UNTIL DISCUSSING WITH DR. Riley Kill ON 03/22/12.  * No food or drink after midnight in preparation for your stress test on 03/22/12.  * No caffeine on 03/22/12 in preparation for your stress test on 03/22/12.  * Please hold all BP medications the morning of 03/22/12.  * Wear comfortable shoes on 03/22/12 as you will be walking on a treadmill.

## 2012-03-21 NOTE — Care Management Note (Unsigned)
    Page 1 of 1   03/21/2012     9:18:01 AM   CARE MANAGEMENT NOTE 03/21/2012  Patient:  Meghan Oliver, Meghan Oliver   Account Number:  [id number]  Date Initiated:  03/21/2012  Documentation initiated by:  SIMMONS,Jaramiah Bossard  Subjective/Objective Assessment:   ADMITTED WITH NEAR SYNCOPE; LIVES AT HOME WITH HUSBAND- [name]; IPTA; HAS PRIVATE DUTY RN - [name] CARR.     Action/Plan:   DISCHARGE PLANNING DISCUSSED WITH PT AT BEDSIDE.   Anticipated DC Date:  03/22/2012   Anticipated DC Plan:  HOME/SELF CARE      DC Planning Services  CM consult      Choice offered to / List presented to:             Status of service:  In process, will continue to follow Medicare Important Message given?   (If response is "NO", the following Medicare IM given date fields will be blank) Date Medicare IM given:   Date Additional Medicare IM given:    Discharge Disposition:    Per UR Regulation:  Reviewed for med. necessity/level of care/duration of stay  If discussed at Long Length of Stay Meetings, dates discussed:    Comments:  03/21/12  0851  Psalm Schappell SIMMONS RN, BSN [phone] NCM WILL FOLLOW.

## 2012-03-21 NOTE — Progress Notes (Signed)
Patient Name: EMIYA LOOMER Date of Encounter: 03/21/2012   Principal Problem:  *Near syncope Active Problems:  HYPERLIPIDEMIA  BIPOLAR AFFECTIVE DISORDER  GERD  HYPERTENSION, UNSPECIFIED  Depression  Hypothyroidism  UTI (lower urinary tract infection)  Sinus bradycardia    SUBJECTIVE: Patient feels well this morning. Ambulated without incident. No further lightheadedness, presyncope or dizziness. No chest pain or shortness of breath.  OBJECTIVE  Filed Vitals:   03/20/12 1721 03/20/12 1822 03/20/12 1933 03/21/12 0457  BP: 117/56 105/64 105/74 129/56  Pulse: 48 48 49 50  Temp:   98.6 F (37 C) 98.4 F (36.9 C)  TempSrc:   Oral Oral  Resp:   18 16  Height:   4\' 11"  (1.499 m)   Weight:   61.7 kg (136 lb 0.4 oz)   SpO2:  98% 94% 97%    Intake/Output Summary (Last 24 hours) at 03/21/12 1028 Last data filed at 03/21/12 0900  Gross per 24 hour  Intake   1445 ml  Output      0 ml  Net   1445 ml   Weight change:   PHYSICAL EXAM  General: Well developed, well nourished, in no acute distress. Head: Normocephalic, atraumatic, sclera non-icteric, no xanthomas, nares are without discharge.  Neck: Supple without bruits or JVD. Lungs:  Resp regular and unlabored, CTA. Heart: RRR no s3, s4, or murmurs. Abdomen: Soft, non-tender, non-distended, BS + x 4.  Msk:  Strength and tone appears normal for age. Extremities: No clubbing, cyanosis or edema. DP/PT/Radials 2+ and equal bilaterally. Neuro: Alert and oriented X 3. Moves all extremities spontaneously. Psych: Normal affect.  LABS:  Recent Labs  [facility] 03/20/12 1643   WBC 7.1   HGB 11.1*   HCT 33.6*   MCV 94.9   PLT 212    Recent Labs  Basename 03/20/12 1738   DDIMER 0.26    Lab 03/20/12 1738  NA 139  K 3.3*  CL 105  CO2 23  BUN 29*  CREATININE 0.93  CALCIUM 9.3  PROT --  BILITOT --  ALKPHOS --  ALT --  AST --  AMYLASE --  LIPASE --  GLUCOSE 94   Recent Labs  Basename 03/21/12 0555 03/21/12  0021 03/20/12 1643   CKTOTAL 47 55 65   CKMB 2.5 2.6 2.9   CKMBINDEX -- -- --   TROPONINI <0.30 <0.30 <0.30    Basename 03/20/12 1643  TSH 1.764  T4TOTAL --  T3FREE --  THYROIDAB --   TELE: sinus bradycardia, HR 40-50s  ECG: sinus bradycardia, improved T wave changes anteriorly, TW III, TW flattening in aVF, V2; otherwise no ST-T wave changes  Radiology/Studies:  No results found.  Current Medications:     . aspirin  324 mg Oral NOW   Or  . aspirin  300 mg Rectal NOW  . atorvastatin  40 mg Oral q1800  . ciprofloxacin  500 mg Oral BID  . fesoterodine  8 mg Oral Daily  . FLUoxetine  40 mg Oral Daily  . levothyroxine  75 mcg Oral QAC breakfast  . lithium carbonate  150 mg Oral Daily  . lithium carbonate  300 mg Oral QHS  . OLANZapine  2.5 mg Oral Daily  . omega-3 acid ethyl esters  1 g Oral Daily  . pantoprazole  80 mg Oral Q1200  . potassium chloride  40 mEq Oral Once  . topiramate  50 mg Oral QHS  . DISCONTD: aspirin  324 mg Oral NOW  .  DISCONTD: aspirin EC  81 mg Oral Daily  . DISCONTD: aspirin EC  81 mg Oral Daily  . DISCONTD: aspirin  300 mg Rectal NOW  . DISCONTD: FLUoxetine  40 mg Oral Daily  . DISCONTD: lithium carbonate  150-300 mg Oral UD    ASSESSMENT:  1. Presyncope 2. Sinus bradycardia 3. UTI 4. Hypokalemia 5. HTN 6. HL 7. GERD 8. Hypothyroidism 9. Depression/bipolar affective disorder  DISCUSSION/PLAN:   Patient feels well this AM, asymptomatic. Ambulated in the halls without incident, HR to 60s. HR 40-50s overnight. Cardiac biomarkers negative x 3. She is stable for discharge. Will schedule ETT in the office tomorrow. Patient has numerous antihypertensives outpatient which were held on admission. BP stable. These will be held today, and added gradually with appropriate adjustments to be determined by Dr. Riley Kill in the near future. Additionally, she is on several medications for depression/bipolar affective disorder which certainly can cause  bradycardia and lightheadedness. TSH WNL yesterday. Patient does have a noted history of moderate OSA (contributing to bradycardia, evaluated/managed by Dr. Shelle Iron) and carotid artery disease. Will plan on outpatient repeat dopplers for the latter. Ciprofloxacin will be switched to 250mg  q12hr x 3 days for uncomplicated UTI. Urine culture on 03/16/12 grew E coli. Potassium supplementation for hypokalemia. Chlorthalidone held today as above. Re-initiation with potassium supplementation TBD in the future.    Signed, R. Hurman Horn, PA-C 03/21/2012, 10:28 AM

## 2012-03-21 NOTE — Progress Notes (Signed)
Medications and follow up appts reviewed with patient. Understanding verbalized. Condition stable upon discharge. 

## 2012-03-21 NOTE — Discharge Summary (Signed)
Discharge Summary   Patient ID: Meghan Oliver,  MRN: [id number], DOB/AGE: 72/72/1942 72 y.o.  Admit date: 03/20/2012 Discharge date: 03/21/2012  Discharge Diagnoses Principal Problem:  *Near syncope  - With associated DOE  - Inderal prescribed recently for essential tremors held  - Outpatient antihypertensives held  - Gently hydrated  - D-dimer WNL  - Orthostatic VS normal  - Cardiac biomarkers negative x 3  - EKG with prominent TW changes on admission, resolved today  - Resolved today, discharge with ETT tomorrow (03/22/12) with Dr. Riley Kill  - Carotid dopplers as scheduled below  Active Problems:  HYPERLIPIDEMIA  - Stable, statin and fish oil continued  GERD  - Stable, PPI continued  HYPERTENSION, UNSPECIFIED  - Antihypertensives held in the setting of presyncope  - BP stable today, Dr. Riley Kill will adjust accordingly  Depression/bipolar affective disorder  - On numerous medications for this  - Continued on admission  Hypothyroidism  - TSH WNL  - Stable, Synthroid continued   UTI (lower urinary tract infection)  - Ciprofloxacin reduced from 500mg  to 250mg  BID for uncomplicated UTI  - Urine culture 03/16/12 grew E coli  Sinus bradycardia  - HR 40-50s on admission, overnight  - HR 60s on ambulation  - Inderal held   Allergies No Known Allergies  Diagnostic Studies/Procedures  Bedside echocardiogram 03/20/12-  Performed by Dr. Tenny Craw. "Normal LV and RV size and function. No regional WMAs. Mild AI. No pericardial effusion."   History of Present Illness  Meghan Oliver is a 71yo Caucasian female with PMHx significant for the above problem list, including moderate OSA and carotid artery disease who was admitted to [facility] on 03/20/12 for DOE and presyncope.   She reportedly began experiencing increased DOE and presyncope over the prior few days to admission. She denied chest pain, palpitations or syncope. She was placed on propanolol 5 days prior for  essential tremor) and endorsed recent travel (flight from [facility] last week).   Hospital Course   In the ED, EKG revealed new anterior TW changes. Initial set of cardiac biomarkers WNL. CBC WNL. TSH WNL. Orthostatic vital signs WNL. Bedside echo as above. The patient was subsequently admitted for ACS rule-out. Inderal and outpatient antihypertensives were held. She was gently hydrated. Cardiac biomarkers were cycled, and two subsequent sets returned negative. The patient ambulated without incident. HR 40-50s overnight, 60s on ambulation. Asymptomatic. She was assessed by Dr. Riley Kill this morning and found to be stable for discharge. She will follow-up in the office tomorrow for ETT with Dr. Riley Kill. Repeat carotid dopplers as scheduled below. She will hold her antihypertensives until further adjustments are made by Dr. Riley Kill. Of note, she has moderate OSA, is on multiple psych medications, and multiple antihypertensives, all of which could contribute to her presenting symptoms. This information has been clearly outlined in discharge AVS.   Discharge Vitals:  Blood pressure 129/56, pulse 50, temperature 98.4 F (36.9 C), temperature source Oral, resp. rate 16, height 4\' 11"  (1.499 m), weight 61.7 kg (136 lb 0.4 oz), SpO2 97.00%.   Labs: Recent Labs  Basename 03/20/12 1643   WBC 7.1   HGB 11.1*   HCT 33.6*   MCV 94.9   PLT 212   Recent Labs  Basename 03/20/12 1738   DDIMER 0.26    Lab 03/20/12 1738  NA 139  K 3.3*  CL 105  CO2 23  BUN 29*  CREATININE 0.93  CALCIUM 9.3  PROT --  BILITOT --  ALKPHOS --  ALT --  AST --  AMYLASE --  LIPASE --  GLUCOSE 94   Recent Labs  Basename 03/21/12 0555 03/21/12 0021 03/20/12 1643   CKTOTAL 47 55 65   CKMB 2.5 2.6 2.9   CKMBINDEX -- -- --   TROPONINI <0.30 <0.30 <0.30    Basename 03/20/12 1643  TSH 1.764  T4TOTAL --  T3FREE --  THYROIDAB --   Disposition:  Discharge Orders    Future Appointments: Provider: Department:  Dept Phone: Center:   03/22/2012 12:00 PM Herby Abraham, MD [facility] [phone] [facility]     Joint Appt [facility] [phone] [facility]   03/29/2012 12:00 PM [facility] Pv 3 [facility]  None   04/13/2012 9:10 AM Margaree Mackintosh, MD [facility] [phone] [facility]   04/14/2012 3:00 PM Margaree Mackintosh, MD [facility] [phone] [facility]     Follow-up Information    Follow up with [facility] on 03/22/2012. (At 12:00PM for stress testing. )    Contact information:   [address] [place] [address]       Follow up with [name] on 03/29/2012. (At 12:00 PM for carotid doppler ultrasound. )    Contact information:   [address] [place] [address]         Discharge Medications:  Medication List  As of 03/21/2012 10:38 AM   CHANGE how you take these medications         ciprofloxacin 500 MG tablet   Commonly known as: CIPRO   Take 0.5 tablets (250 mg total) by mouth 2 (two) times daily.   What changed: dose         CONTINUE taking these medications         aspirin 81 MG tablet      CALCIUM 600/VITAMIN D PO      celecoxib 200 MG capsule   Commonly known as: CELEBREX      cetirizine 10 MG tablet   Commonly known as: ZYRTEC      clonazePAM 0.5 MG tablet   Commonly known as: KLONOPIN      docusate-casanthranol 100-30 MG per capsule   Commonly known as: PERICOLACE      fesoterodine 8 MG Tb24   Commonly known as: TOVIAZ   Take 1 tablet (8 mg total) by mouth daily.      Fish Oil 1200 MG Caps      FLUoxetine 40 MG capsule   Commonly known as: PROZAC      levothyroxine 75 MCG tablet   Commonly known as: SYNTHROID, LEVOTHROID      lithium carbonate 150 MG capsule      multivitamin with minerals tablet      OLANZapine 5 MG tablet   Commonly known as: ZYPREXA      omeprazole 40 MG capsule   Commonly known as: PRILOSEC      polyethylene  glycol packet   Commonly known as: MIRALAX / GLYCOLAX      rosuvastatin 20 MG tablet   Commonly known as: CRESTOR   Take 1 tablet (20 mg total) by mouth daily.      topiramate 25 MG tablet   Commonly known as: TOPAMAX      traMADol 50 MG tablet   Commonly known as: ULTRAM      VAGIFEM 10 MCG Tabs   Generic drug: Estradiol   INSERT 1 TABLET VAGINALLY AT BEDTIME FOR 2 WEEKS  valACYclovir 500 MG tablet   Commonly known as: VALTREX      vitamin B-12 1000 MCG tablet   Commonly known as: CYANOCOBALAMIN      Vitamin D3 2000 UNITS Tabs         STOP taking these medications         amLODipine 10 MG tablet      chlorthalidone 25 MG tablet      losartan 100 MG tablet      potassium chloride SA 20 MEQ tablet          Where to get your medications    These are the prescriptions that you need to pick up. We sent them to a specific pharmacy, so you will need to go there to get them.   [id number] - [place], [place] - [address] AT [facility] [address]    [address] [place] [address]    Phone: [phone]        ciprofloxacin 500 MG tablet           Outstanding Labs/Studies: ETT on 03/22/12; Carotid doppler ultrasound 03/29/12  Duration of Discharge Encounter: Greater than 30 minutes including physician time.  Signed, R. Hurman Horn, PA-C 03/21/2012, 10:38 AM

## 2012-03-22 ENCOUNTER — Encounter: Payer: Medicare Other | Admitting: Cardiology

## 2012-03-22 LAB — URINE CULTURE
Colony Count: NO GROWTH
Culture  Setup Time: 201306181123

## 2012-03-23 ENCOUNTER — Other Ambulatory Visit: Payer: Self-pay

## 2012-03-23 DIAGNOSIS — E876 Hypokalemia: Secondary | ICD-10-CM

## 2012-03-24 ENCOUNTER — Other Ambulatory Visit (INDEPENDENT_AMBULATORY_CARE_PROVIDER_SITE_OTHER): Payer: Medicare Other

## 2012-03-24 DIAGNOSIS — E876 Hypokalemia: Secondary | ICD-10-CM | POA: Diagnosis not present

## 2012-03-24 LAB — BASIC METABOLIC PANEL
BUN: 25 mg/dL — ABNORMAL HIGH (ref 6–23)
CO2: 24 mEq/L (ref 19–32)
Calcium: 9 mg/dL (ref 8.4–10.5)
Creatinine, Ser: 0.9 mg/dL (ref 0.4–1.2)
GFR: 62.18 mL/min (ref >60.00)
Glucose, Bld: 104 mg/dL — ABNORMAL HIGH (ref 70–99)

## 2012-03-27 ENCOUNTER — Other Ambulatory Visit: Payer: Medicare Other | Admitting: Internal Medicine

## 2012-03-27 DIAGNOSIS — F3112 Bipolar disorder, current episode manic without psychotic features, moderate: Secondary | ICD-10-CM

## 2012-03-27 DIAGNOSIS — E039 Hypothyroidism, unspecified: Secondary | ICD-10-CM | POA: Diagnosis not present

## 2012-03-27 DIAGNOSIS — Z79899 Other long term (current) drug therapy: Secondary | ICD-10-CM

## 2012-03-28 LAB — BASIC METABOLIC PANEL
CO2: 22 mEq/L (ref 19–32)
Chloride: 106 mEq/L (ref 96–112)
Glucose, Bld: 111 mg/dL — ABNORMAL HIGH (ref 70–99)
Potassium: 3.6 mEq/L (ref 3.5–5.3)
Sodium: 137 mEq/L (ref 135–145)

## 2012-03-29 ENCOUNTER — Ambulatory Visit (INDEPENDENT_AMBULATORY_CARE_PROVIDER_SITE_OTHER): Payer: Medicare Other | Admitting: Women's Health

## 2012-03-29 ENCOUNTER — Encounter: Payer: Self-pay | Admitting: Women's Health

## 2012-03-29 DIAGNOSIS — R350 Frequency of micturition: Secondary | ICD-10-CM

## 2012-03-29 DIAGNOSIS — IMO0001 Reserved for inherently not codable concepts without codable children: Secondary | ICD-10-CM

## 2012-03-29 DIAGNOSIS — L293 Anogenital pruritus, unspecified: Secondary | ICD-10-CM

## 2012-03-29 DIAGNOSIS — N898 Other specified noninflammatory disorders of vagina: Secondary | ICD-10-CM

## 2012-03-29 LAB — URINALYSIS W MICROSCOPIC + REFLEX CULTURE
Crystals: NONE SEEN
Nitrite: NEGATIVE
Protein, ur: NEGATIVE mg/dL
Specific Gravity, Urine: 1.025 (ref 1.005–1.030)
Urobilinogen, UA: 0.2 mg/dL (ref 0.0–1.0)

## 2012-03-29 LAB — WET PREP FOR TRICH, YEAST, CLUE
Clue Cells Wet Prep HPF POC: NONE SEEN
Trich, Wet Prep: NONE SEEN

## 2012-03-29 MED ORDER — FLUCONAZOLE 150 MG PO TABS: ORAL_TABLET | ORAL | Status: DC

## 2012-03-29 MED ORDER — FLUCONAZOLE 150 MG PO TABS: 150.0000 mg | ORAL_TABLET | Freq: Once | ORAL | Status: DC

## 2012-03-29 NOTE — Patient Instructions (Addendum)
Monilial Vaginitis Vaginitis in a soreness, swelling and redness (inflammation) of the vagina and vulva. Monilial vaginitis is not a sexually transmitted infection. CAUSES  Yeast vaginitis is caused by yeast (candida) that is normally found in your vagina. With a yeast infection, the candida has overgrown in number to a point that upsets the chemical balance. SYMPTOMS   White, thick vaginal discharge.   Swelling, itching, redness and irritation of the vagina and possibly the lips of the vagina (vulva).   Burning or painful urination.   Painful intercourse.  DIAGNOSIS  Things that may contribute to monilial vaginitis are:  Postmenopausal and virginal states.   Pregnancy.   Infections.   Being tired, sick or stressed, especially if you had monilial vaginitis in the past.   Diabetes. Good control will help lower the chance.   Birth control pills.   Tight fitting garments.   Using bubble bath, feminine sprays, douches or deodorant tampons.   Taking certain medications that kill germs (antibiotics).   Sporadic recurrence can occur if you become ill.  TREATMENT  Your caregiver will give you medication.  There are several kinds of anti monilial vaginal creams and suppositories specific for monilial vaginitis. For recurrent yeast infections, use a suppository or cream in the vagina 2 times a week, or as directed.   Anti-monilial or steroid cream for the itching or irritation of the vulva may also be used. Get your caregiver's permission.   Painting the vagina with methylene blue solution may help if the monilial cream does not work.   Eating yogurt may help prevent monilial vaginitis.  HOME CARE INSTRUCTIONS   Finish all medication as prescribed.   Do not have sex until treatment is completed or after your caregiver tells you it is okay.   Take warm sitz baths.   Do not douche.   Do not use tampons, especially scented ones.   Wear cotton underwear.   Avoid tight  pants and panty hose.   Tell your sexual partner that you have a yeast infection. They should go to their caregiver if they have symptoms such as mild rash or itching.   Your sexual partner should be treated as well if your infection is difficult to eliminate.   Practice safer sex. Use condoms.   Some vaginal medications cause latex condoms to fail. Vaginal medications that harm condoms are:   Cleocin cream.   Butoconazole (Femstat).   Terconazole (Terazol) vaginal suppository.   Miconazole (Monistat) (may be purchased over the counter).  SEEK MEDICAL CARE IF:   You have a temperature by mouth above 102 F (38.9 C).   The infection is getting worse after 2 days of treatment.   The infection is not getting better after 3 days of treatment.   You develop blisters in or around your vagina.   You develop vaginal bleeding, and it is not your menstrual period.   You have pain when you urinate.   You develop intestinal problems.   You have pain with sexual intercourse.  Document Released: 06/30/2005 Document Revised: 09/09/2011 Document Reviewed: 03/14/2009 Exit[employer] Patient Information 2012 [employer]. 

## 2012-03-29 NOTE — Progress Notes (Signed)
Patient ID: Meghan Oliver, female   DOB: 06/29/1941, 71 y.o.   MRN: [id number] Presents with the complaint of vaginal itching for about one week. Had been treated for a UTI at annual exam, first with Keflex and then Cipro. Finished last-dose yesterday. Denies any urinary symptoms today.  Exam: External genitalia erythematous at introitus, wet prep done with a Q-tip. Wet prep negative. UA: Trace leukocytes, 3-6 WBCs and a few bacteria. Urine culture pending.  Probable yeast infection  Plan: Diflucan 150 by mouth x1 dose with refill. Yeast and UTI prevention discussed, will call if no relief. Will call with urine culture results.

## 2012-03-31 LAB — URINE CULTURE

## 2012-04-02 NOTE — Discharge Summary (Signed)
Seen and agree.  Patient needs early follow up with me.  Please see arrangements.  TS

## 2012-04-02 NOTE — Progress Notes (Signed)
See my note.  Patient already up and wants to go home.  Arrangements made for OP GXT this week.

## 2012-04-13 ENCOUNTER — Other Ambulatory Visit: Payer: Medicare Other | Admitting: Internal Medicine

## 2012-04-13 ENCOUNTER — Other Ambulatory Visit: Payer: Self-pay | Admitting: Cardiology

## 2012-04-13 DIAGNOSIS — I10 Essential (primary) hypertension: Secondary | ICD-10-CM | POA: Diagnosis not present

## 2012-04-13 DIAGNOSIS — E785 Hyperlipidemia, unspecified: Secondary | ICD-10-CM | POA: Diagnosis not present

## 2012-04-13 DIAGNOSIS — Z79899 Other long term (current) drug therapy: Secondary | ICD-10-CM | POA: Diagnosis not present

## 2012-04-13 DIAGNOSIS — M810 Age-related osteoporosis without current pathological fracture: Secondary | ICD-10-CM | POA: Diagnosis not present

## 2012-04-13 DIAGNOSIS — I6529 Occlusion and stenosis of unspecified carotid artery: Secondary | ICD-10-CM

## 2012-04-13 LAB — CBC WITH DIFFERENTIAL/PLATELET
Basophils Absolute: 0 10*3/uL (ref 0.0–0.1)
Eosinophils Relative: 3 % (ref 0–5)
HCT: 36.6 % (ref 36.0–46.0)
Lymphocytes Relative: 25 % (ref 12–46)
Lymphs Abs: 1.5 10*3/uL (ref 0.7–4.0)
MCV: 94.6 fL (ref 78.0–100.0)
Neutro Abs: 3.7 10*3/uL (ref 1.7–7.7)
Platelets: 247 10*3/uL (ref 150–400)
RBC: 3.87 MIL/uL (ref 3.87–5.11)
RDW: 12.5 % (ref 11.5–15.5)
WBC: 6 10*3/uL (ref 4.0–10.5)

## 2012-04-14 ENCOUNTER — Ambulatory Visit (INDEPENDENT_AMBULATORY_CARE_PROVIDER_SITE_OTHER): Payer: Medicare Other | Admitting: Internal Medicine

## 2012-04-14 ENCOUNTER — Encounter: Payer: Self-pay | Admitting: Internal Medicine

## 2012-04-14 VITALS — BP 106/58 | HR 56 | Temp 99.4°F | Ht 59.0 in | Wt 132.0 lb

## 2012-04-14 DIAGNOSIS — I10 Essential (primary) hypertension: Secondary | ICD-10-CM | POA: Diagnosis not present

## 2012-04-14 DIAGNOSIS — Z0000 Encounter for general adult medical examination without abnormal findings: Secondary | ICD-10-CM | POA: Diagnosis not present

## 2012-04-14 DIAGNOSIS — R609 Edema, unspecified: Secondary | ICD-10-CM

## 2012-04-14 DIAGNOSIS — F319 Bipolar disorder, unspecified: Secondary | ICD-10-CM

## 2012-04-14 DIAGNOSIS — I779 Disorder of arteries and arterioles, unspecified: Secondary | ICD-10-CM | POA: Diagnosis not present

## 2012-04-14 DIAGNOSIS — K219 Gastro-esophageal reflux disease without esophagitis: Secondary | ICD-10-CM | POA: Diagnosis not present

## 2012-04-14 DIAGNOSIS — M549 Dorsalgia, unspecified: Secondary | ICD-10-CM | POA: Diagnosis not present

## 2012-04-14 DIAGNOSIS — Z8739 Personal history of other diseases of the musculoskeletal system and connective tissue: Secondary | ICD-10-CM

## 2012-04-14 DIAGNOSIS — E039 Hypothyroidism, unspecified: Secondary | ICD-10-CM

## 2012-04-14 DIAGNOSIS — E785 Hyperlipidemia, unspecified: Secondary | ICD-10-CM

## 2012-04-14 DIAGNOSIS — R0989 Other specified symptoms and signs involving the circulatory and respiratory systems: Secondary | ICD-10-CM

## 2012-04-14 DIAGNOSIS — M47817 Spondylosis without myelopathy or radiculopathy, lumbosacral region: Secondary | ICD-10-CM | POA: Diagnosis not present

## 2012-04-14 DIAGNOSIS — M810 Age-related osteoporosis without current pathological fracture: Secondary | ICD-10-CM

## 2012-04-14 DIAGNOSIS — G4733 Obstructive sleep apnea (adult) (pediatric): Secondary | ICD-10-CM

## 2012-04-14 DIAGNOSIS — K5900 Constipation, unspecified: Secondary | ICD-10-CM

## 2012-04-14 DIAGNOSIS — G250 Essential tremor: Secondary | ICD-10-CM | POA: Diagnosis not present

## 2012-04-14 DIAGNOSIS — M4310 Spondylolisthesis, site unspecified: Secondary | ICD-10-CM | POA: Diagnosis not present

## 2012-04-14 LAB — POCT URINALYSIS DIPSTICK
Leukocytes, UA: NEGATIVE
Protein, UA: NEGATIVE
Spec Grav, UA: 1.01
Urobilinogen, UA: NEGATIVE

## 2012-04-14 LAB — COMPREHENSIVE METABOLIC PANEL
ALT: 33 U/L (ref 0–35)
AST: 29 U/L (ref 0–37)
Albumin: 3.8 g/dL (ref 3.5–5.2)
CO2: 29 mEq/L (ref 19–32)
Calcium: 9 mg/dL (ref 8.4–10.5)
Chloride: 105 mEq/L (ref 96–112)
Creat: 0.88 mg/dL (ref 0.50–1.10)
Potassium: 3.8 mEq/L (ref 3.5–5.3)
Total Protein: 6.1 g/dL (ref 6.0–8.3)

## 2012-04-14 LAB — VITAMIN D 25 HYDROXY (VIT D DEFICIENCY, FRACTURES): Vit D, 25-Hydroxy: 68 ng/mL (ref 30–89)

## 2012-04-14 LAB — LIPID PANEL: Cholesterol: 163 mg/dL (ref 0–200)

## 2012-04-17 ENCOUNTER — Ambulatory Visit (INDEPENDENT_AMBULATORY_CARE_PROVIDER_SITE_OTHER): Payer: Medicare Other | Admitting: Cardiology

## 2012-04-17 ENCOUNTER — Encounter: Payer: Medicare Other | Admitting: Cardiology

## 2012-04-17 ENCOUNTER — Encounter: Payer: Self-pay | Admitting: Cardiology

## 2012-04-17 ENCOUNTER — Encounter (INDEPENDENT_AMBULATORY_CARE_PROVIDER_SITE_OTHER): Payer: Medicare Other

## 2012-04-17 DIAGNOSIS — R42 Dizziness and giddiness: Secondary | ICD-10-CM

## 2012-04-17 DIAGNOSIS — I6529 Occlusion and stenosis of unspecified carotid artery: Secondary | ICD-10-CM

## 2012-04-17 NOTE — Progress Notes (Signed)
Exercise Treadmill Test  Pre-Exercise Testing Evaluation Rhythm: sinus bradycardia  Rate: 51   PR:  .16 QRS:  .09  QT:  .49 QTc: .45           Test  Exercise Tolerance Test Ordering MD: Shawnie Pons, MD  Interpreting MD: Shawnie Pons, MD  Unique Test No: 1  Treadmill:  1  Indication for ETT: Dizziness/syncope  Contraindication to ETT: No   Stress Modality: exercise - treadmill  Cardiac Imaging Performed: non   Protocol: standard Bruce - maximal  Max BP:  185/52/  Max MPHR (bpm):  148 85% MPR (bpm): 126  MPHR obtained (bpm):  107 % MPHR obtained:  71%  Reached 85% MPHR (min:sec):  na Total Exercise Time (min-sec):  5:45  Workload in METS:  7 ,ets Borg Scale: 13  Reason ETT Terminated:  fatigue    ST Segment Analysis At Rest: normal ST segments - no evidence of significant ST depression With Exercise: non-specific ST changes  Other Information Arrhythmia:  No Angina during ETT:  absent (0) Quality of ETT:  non-diagnostic  ETT Interpretation:  borderline (indeterminate) with non-specific ST changes  Comments: [place] exercised on the [employer].  Exercise tolerance was somewhat limited.  She wanted to stop.  She had no chest pain, and was mildly but not markedly fatigued.  There was a slightly wide pulse pressure, and normal BP response to exercise.  She had no diagnostic ischemic changes with exercise.    Recommendations:  1.  Continue BP meds 2.  Repeat echocardiogram in 4-6 months to assess for AI 3.  BMET to check electrolytes.

## 2012-04-17 NOTE — Addendum Note (Signed)
Addended by: Dossie Arbour on: 04/17/2012 02:55 PM   Modules accepted: Orders

## 2012-04-18 LAB — BASIC METABOLIC PANEL
Calcium: 9.2 mg/dL (ref 8.4–10.5)
GFR: 55.94 mL/min — ABNORMAL LOW (ref >60.00)
Glucose, Bld: 105 mg/dL — ABNORMAL HIGH (ref 70–99)
Sodium: 140 mEq/L (ref 135–145)

## 2012-05-01 ENCOUNTER — Other Ambulatory Visit: Payer: Self-pay | Admitting: *Deleted

## 2012-05-01 NOTE — Telephone Encounter (Signed)
error 

## 2012-05-02 DIAGNOSIS — F332 Major depressive disorder, recurrent severe without psychotic features: Secondary | ICD-10-CM | POA: Diagnosis not present

## 2012-05-22 DIAGNOSIS — F332 Major depressive disorder, recurrent severe without psychotic features: Secondary | ICD-10-CM | POA: Diagnosis not present

## 2012-06-20 ENCOUNTER — Other Ambulatory Visit (INDEPENDENT_AMBULATORY_CARE_PROVIDER_SITE_OTHER): Payer: Medicare Other | Admitting: Internal Medicine

## 2012-06-20 ENCOUNTER — Ambulatory Visit: Payer: Medicare Other | Admitting: Internal Medicine

## 2012-06-20 DIAGNOSIS — Z79899 Other long term (current) drug therapy: Secondary | ICD-10-CM | POA: Diagnosis not present

## 2012-06-20 DIAGNOSIS — Z23 Encounter for immunization: Secondary | ICD-10-CM | POA: Diagnosis not present

## 2012-06-20 DIAGNOSIS — F332 Major depressive disorder, recurrent severe without psychotic features: Secondary | ICD-10-CM

## 2012-06-21 ENCOUNTER — Ambulatory Visit (INDEPENDENT_AMBULATORY_CARE_PROVIDER_SITE_OTHER): Payer: Medicare Other | Admitting: Cardiology

## 2012-06-21 ENCOUNTER — Encounter: Payer: Self-pay | Admitting: Cardiology

## 2012-06-21 VITALS — BP 130/78 | HR 48 | Ht 59.0 in | Wt 135.0 lb

## 2012-06-21 DIAGNOSIS — E039 Hypothyroidism, unspecified: Secondary | ICD-10-CM

## 2012-06-21 DIAGNOSIS — E785 Hyperlipidemia, unspecified: Secondary | ICD-10-CM

## 2012-06-21 DIAGNOSIS — I10 Essential (primary) hypertension: Secondary | ICD-10-CM | POA: Diagnosis not present

## 2012-06-21 DIAGNOSIS — I6529 Occlusion and stenosis of unspecified carotid artery: Secondary | ICD-10-CM

## 2012-06-21 DIAGNOSIS — R011 Cardiac murmur, unspecified: Secondary | ICD-10-CM

## 2012-06-21 DIAGNOSIS — I2510 Atherosclerotic heart disease of native coronary artery without angina pectoris: Secondary | ICD-10-CM | POA: Diagnosis not present

## 2012-06-21 DIAGNOSIS — R911 Solitary pulmonary nodule: Secondary | ICD-10-CM

## 2012-06-22 ENCOUNTER — Ambulatory Visit: Payer: Medicare Other | Admitting: Cardiology

## 2012-06-25 DIAGNOSIS — I2510 Atherosclerotic heart disease of native coronary artery without angina pectoris: Secondary | ICD-10-CM | POA: Insufficient documentation

## 2012-06-25 DIAGNOSIS — R911 Solitary pulmonary nodule: Secondary | ICD-10-CM | POA: Insufficient documentation

## 2012-06-25 NOTE — Assessment & Plan Note (Signed)
Last TSH was normal

## 2012-06-25 NOTE — Progress Notes (Signed)
HPI:  The patient returns for followup today. From a cardiac standpoint she's been overall stable. She has been seeing a number of providers recently, and this included Dr. Sandria Manly.  Patient has had no syncope or presyncope.  She has fortunately returned exercising on a regular basis. Her medicines were reviewed today, as were the results of her carotid Dopplers.  She was at target on her most recent lipid profile.    Current Outpatient Prescriptions  Medication Sig Dispense Refill  . amLODipine (NORVASC) 10 MG tablet Take 10 mg by mouth daily.      Marland Kitchen aspirin 81 MG tablet Take 81 mg by mouth daily.        . Calcium Carbonate-Vitamin D (CALCIUM 600/VITAMIN D PO) Take 1 tablet by mouth 2 (two) times daily.        . celecoxib (CELEBREX) 200 MG capsule Take 200 mg by mouth daily.       . cetirizine (ZYRTEC) 10 MG tablet Take 10 mg by mouth as needed.       . Cholecalciferol (VITAMIN D3) 2000 UNITS TABS Take 2 tablets by mouth daily.       . clonazePAM (KLONOPIN) 0.5 MG tablet Take 0.25 mg by mouth as needed.       . docusate-casanthranol (PERICOLACE) 100-30 MG per capsule daily as needed.        . Estradiol (VAGIFEM) 10 MCG TABS       . fesoterodine (TOVIAZ) 8 MG TB24 Take 1 tablet (8 mg total) by mouth daily.  90 tablet  3  . fluconazole (DIFLUCAN) 150 MG tablet Take one tablet now and repeat if needed in 3 days  2 tablet  1  . FLUoxetine (PROZAC) 40 MG capsule Take 40 mg by mouth daily.        Marland Kitchen KLOR-CON M20 20 MEQ tablet Take 20 mEq by mouth daily.       Marland Kitchen levothyroxine (SYNTHROID, LEVOTHROID) 75 MCG tablet Take 75 mcg by mouth daily.      Marland Kitchen lithium 150 MG capsule 150 every am and 300 every pm       . losartan (COZAAR) 100 MG tablet Take 100 mg by mouth daily.      . Multiple Vitamins-Minerals (MULTIVITAMIN WITH MINERALS) tablet Take 1 tablet by mouth daily.        . NON FORMULARY chlorathalidone 25mg  Daily      . OLANZapine (ZYPREXA) 5 MG tablet Take 1/2 tablet daily      . Omega-3 Fatty  Acids (FISH OIL) 1200 MG CAPS Take 1 capsule by mouth daily.        Marland Kitchen omeprazole (PRILOSEC) 40 MG capsule Take 40 mg by mouth 2 (two) times daily.       . polyethylene glycol (MIRALAX / GLYCOLAX) packet Take 17 g by mouth daily as needed.        . risedronate (ACTONEL) 150 MG tablet Take 150 mg by mouth every 30 (thirty) days. with water on empty stomach, nothing by mouth or lie down for next 30 minutes.      . rosuvastatin (CRESTOR) 20 MG tablet Take 1 tablet (20 mg total) by mouth daily.  90 tablet  3  . traMADol (ULTRAM) 50 MG tablet Take 50 mg by mouth 2 (two) times daily as needed. For back pain      . valACYclovir (VALTREX) 500 MG tablet Take 500 mg by mouth as needed.       . vitamin B-12 (CYANOCOBALAMIN) 1000 MCG  tablet Take 1,000 mcg by mouth daily.          No Known Allergies  Past Medical History  Diagnosis Date  . GERD (gastroesophageal reflux disease)   . Hyperlipidemia   . Anxiety   . Low back pain   . Osteoporosis   . Carotid disease, bilateral     carotid dopplers 12/10 with 40-59% bilateral ICA stenosis  . Bipolar disease, chronic   . Hypertension   . Depression     Past Surgical History  Procedure Date  . Esophagogastroduodenoscopy 02-26-04  . Appendectomy 1971  . Tubal ligation   . Augmentation mammaplasty   . Abdominal surgery     Laparotomy Ovarian cystctomy    Family History  Problem Relation Age of Onset  . Heart attack Father 50    deceased  . Hypertension Father   . Heart disease Father   . Breast cancer Paternal Grandmother     Age unknown  . Breast cancer Paternal Aunt     Age 11's  . Colon cancer Neg Hx     History   Social History  . Marital Status: Married    Spouse Name: Dr. Victorino Dike    Number of Children: 2  . Years of Education: N/A   Occupational History  . housewife    Social History Main Topics  . Smoking status: Former Smoker -- 2.0 packs/day for 15 years    Types: Cigarettes    Quit date: 01/13/1971  . Smokeless  tobacco: Never Used  . Alcohol Use: 2.0 oz/week    4 drink(s) per week     wine  . Drug Use: No  . Sexually Active: No   Other Topics Concern  . Not on file   Social History Narrative  . No narrative on file    ROS: Please see the HPI.  All other systems reviewed and negative.  PHYSICAL EXAM:  BP 130/78  Pulse 48  Ht 4\' 11"  (1.499 m)  Wt 135 lb (61.236 kg)  BMI 27.27 kg/m2  SpO2 97%  General: Well developed, well nourished, in no acute distress. Head:  Normocephalic and atraumatic. Neck: no JVD Lungs: Clear to auscultation and percussion. Heart: Normal S1 and S2.  1-2/6 SEM.  No DM.   Abdomen:  Normal bowel sounds; soft; non tender; no organomegaly Pulses: Pulses normal in all 4 extremities. Extremities: No clubbing or cyanosis. No edema. Neurologic: Alert and oriented x 3.  EKG:  CAROTID DOPPLERS        0-39 RICA, 40-59% LICA.  Less than prior study. Repeat in one year.   ECHO 11/2010  Study Conclusions  - Left ventricle: The cavity size was normal. Wall thickness was increased in a pattern of mild LVH. Systolic function was normal. The estimated ejection fraction was in the range of 55% to 60%. Wall motion was normal; there were no regional wall motion abnormalities. Doppler parameters are consistent with abnormal left ventricular relaxation (grade 1 diastolic dysfunction). - Aortic valve: Mild regurgitation. - Mitral valve: Mild regurgitation. - Left atrium: The atrium was moderately dilated. - Pulmonary arteries: PA peak pressure: 36mm Hg (S). - Pericardium, extracardiac: A trivial pericardial effusion was  GXT ETT Interpretation: borderline (indeterminate) with non-specific ST changes  Comments:  [place] exercised [place] [employer]. Exercise tolerance was somewhat limited. She wanted to stop. She had no chest pain, and was mildly but not markedly fatigued. There was a slightly wide pulse pressure, and normal BP response to exercise. She  had no  diagnostic ischemic changes with exercise.  Recommendations:  1. Continue BP meds  2. Repeat echocardiogram in 4-6 months to assess for AI  3. BMET to check electrolytes.   CT SCAN  (performed by Dr. Jethro Bolus in 2012)  IMPRESSION:  1. Mild ground-glass in the lower lobes appears unchanged from 09/14/2005, and is nonspecific. No definite subpleural fibrosis and no honeycombing to suggest progressive interstitial lung disease. 2. Tiny right upper lobe nodule is not definitely seen on 09/14/2005. If the patient is at high risk for bronchogenic carcinoma, follow-up chest CT at 1 year is recommended. If the patient is at low risk, no follow-up is needed. This recommendation follows the consensus statement: Guidelines for Management of Small Pulmonary Nodules Detected on CT Scans: A Statement from the Fleischner Society as published in Radiology 2005; 237:395-400. Available online at: DietDisorder.cz. 3. Prominent coronary artery calcification.  Original Report Authenticated By: Reyes Ivan, M.D.       Last Resulted: 08/18/11 9:40 AM               Order Questions          ASSESSMENT AND PLAN:

## 2012-06-25 NOTE — Assessment & Plan Note (Signed)
Last LDL was at target.   

## 2012-06-25 NOTE — Assessment & Plan Note (Signed)
On a medical regimen that includes a diuretic. Need to recheck BUN/Cr.

## 2012-06-25 NOTE — Assessment & Plan Note (Signed)
Needs to be rechecked in December by prior report.

## 2012-06-25 NOTE — Assessment & Plan Note (Signed)
prob mild AS--see echo report.  (mildly thickened leaflets)

## 2012-06-25 NOTE — Assessment & Plan Note (Signed)
Seen on prior study.  As such, would continue lipid approach.  She has a nonspecific treadmill, and no symptoms.  Medical therapy.

## 2012-06-25 NOTE — Assessment & Plan Note (Signed)
Would continue an aggressive lipid approach.  Her carotid disease remains stable, but given the findings would continue.

## 2012-06-26 ENCOUNTER — Telehealth: Payer: Self-pay | Admitting: Internal Medicine

## 2012-06-26 DIAGNOSIS — H2510 Age-related nuclear cataract, unspecified eye: Secondary | ICD-10-CM | POA: Diagnosis not present

## 2012-06-27 NOTE — Telephone Encounter (Signed)
Pt notified that I sp w/Dr. Lenord Fellers and additional shot is not needed.

## 2012-07-05 DIAGNOSIS — H2510 Age-related nuclear cataract, unspecified eye: Secondary | ICD-10-CM | POA: Diagnosis not present

## 2012-07-19 DIAGNOSIS — Y839 Surgical procedure, unspecified as the cause of abnormal reaction of the patient, or of later complication, without mention of misadventure at the time of the procedure: Secondary | ICD-10-CM | POA: Diagnosis not present

## 2012-07-19 DIAGNOSIS — T8529XA Other mechanical complication of intraocular lens, initial encounter: Secondary | ICD-10-CM | POA: Diagnosis not present

## 2012-07-31 DIAGNOSIS — F332 Major depressive disorder, recurrent severe without psychotic features: Secondary | ICD-10-CM | POA: Diagnosis not present

## 2012-08-02 DIAGNOSIS — H2510 Age-related nuclear cataract, unspecified eye: Secondary | ICD-10-CM | POA: Diagnosis not present

## 2012-08-04 DIAGNOSIS — M47817 Spondylosis without myelopathy or radiculopathy, lumbosacral region: Secondary | ICD-10-CM | POA: Diagnosis not present

## 2012-08-04 DIAGNOSIS — M4310 Spondylolisthesis, site unspecified: Secondary | ICD-10-CM | POA: Diagnosis not present

## 2012-08-06 NOTE — Patient Instructions (Addendum)
Continue same medications and return in 6-12 months. Have bone density study done

## 2012-08-06 NOTE — Progress Notes (Signed)
Subjective:    Patient ID: Meghan Oliver, female    DOB: 1941/09/19, 71 y.o.   MRN: [id number]  HPI 71 year old white female in today for health maintenance and evaluation of medical problems. Patient has a long-standing history of depression starting in her 14s have the birth of her first child. History of bipolar disorder. She's been hospitalized twice for bipolar disorder. She says last time she was hospitalized was at [facility] which included electroconvulsive therapy.  She has a history of hypertension. She has a history of lumbar disc disease and sees Dr. Sandria Manly for that. Past history of hematuria which is been worked up by Dr. Shiela Mayer in the past. History of transmitted murmur in her carotids. Has had a Doppler study done December 2010 showing a  40-59% stenosis in both internal carotid arteries. Is on calcium and vitamin D but sometimes forgets to take those. History of osteoporosis but I do not have bone density report from [place]. History of colonoscopy in 2006.  Does not smoke. Drinks wine on Saturday and Sunday. She became menopausal at age 80. Had menarche at age 36-1/2. Has chronic constipation and some nocturia. She has 2 adult daughters.  Family history: Father died at age 24 of an MI. Mother died with history of COPD. One brother in good health. Patient is married. Husband is a retired Solicitor. They usually spend a couple of months and [place] every winter.  Sees [name] for psychiatric medications. Time to time lithium level is requested by [name].  History bilateral breast implants    Review of Systems  Constitutional: Negative.   HENT: Negative.   Eyes: Negative.   Respiratory: Negative.   Cardiovascular: Positive for leg swelling. Negative for chest pain and palpitations.  Gastrointestinal: Negative.   Genitourinary:       Nocturia  Musculoskeletal: Positive for back pain.  Neurological: Negative.   Hematological: Negative.    Psychiatric/Behavioral: Positive for dysphoric mood.       Objective:   Physical Exam  Vitals reviewed. Constitutional: She is oriented to person, place, and time. She appears well-developed and well-nourished. No distress.  HENT:  Head: Normocephalic and atraumatic.  Right Ear: External ear normal.  Left Ear: External ear normal.  Mouth/Throat: Oropharynx is clear and moist. No oropharyngeal exudate.  Eyes: Conjunctivae normal and EOM are normal. Pupils are equal, round, and reactive to light. Right eye exhibits no discharge. Left eye exhibits no discharge. No scleral icterus.  Neck: Neck supple. No JVD present. No thyromegaly present.       Left carotid bruit  Cardiovascular: Normal rate, regular rhythm, normal heart sounds and intact distal pulses.   No murmur heard. Pulmonary/Chest: Effort normal and breath sounds normal. She has no wheezes. She has no rales.  Abdominal: Soft. Bowel sounds are normal. She exhibits no distension and no mass. There is no tenderness. There is no rebound and no guarding.  Musculoskeletal: She exhibits no edema.  Lymphadenopathy:    She has no cervical adenopathy.  Neurological: She is alert and oriented to person, place, and time. She has normal reflexes. No cranial nerve deficit. Coordination normal.  Skin: Skin is warm and dry. No rash noted. She is not diaphoretic.  Psychiatric: She has a normal mood and affect. Her behavior is normal. Judgment and thought content normal.          Assessment & Plan:  Hypertension  Hyperlipidemia  Bilateral breast implants  Left carotid bruit with bilateral carotid disease  noted on Dopplers 2010  History of bipolar disorder treated by [name]  Low back pain-treated by Dr. Sandria Manly  History of sleep apnea  History of GE reflux  History of osteoporosis-needs bone density study in the near future  Hypothyroidism  History of dependent edema  Plan: Patient to return in 6-12 months or as needed.  Needs bone density study if not had one recently. Annual mammogram. Subjective:   Patient presents for Medicare Annual/Subsequent preventive examination.   Review Past Medical/Family/Social: See above   Risk Factors  Current exercise habits: sedentary Dietary issues discussed: low fat low carb  Cardiac risk factors:  Depression Screen  (Note: if answer to either of the following is "Yes", a more complete depression screening is indicated)   Over the past two weeks, have you felt down, depressed or hopeless? yes Over the past two weeks, have you felt little interest or pleasure in doing things? yes Have you lost interest or pleasure in daily life? No Do you often feel hopeless? No Do you cry easily over simple problems? No   Activities of Daily Living  In your present state of health, do you have any difficulty performing the following activities?:   Driving? No  Managing money? No  Feeding yourself? No  Getting from bed to chair? No  Climbing a flight of stairs? No  Preparing food and eating?: No  Bathing or showering? No  Getting dressed: No  Getting to the toilet? No  Using the toilet:No  Moving around from place to place: No  In the past year have you fallen or had a near fall?:No  Are you sexually active?  Do you have more than one partner? No   Hearing Difficulties: No  Do you often ask people to speak up or repeat themselves? No  Do you experience ringing or noises in your ears? No  Do you have difficulty understanding soft or whispered voices? No  Do you feel that you have a problem with memory? No Do you often misplace items? No    Home Safety:  Do you have a smoke alarm at your residence? Yes Do you have grab bars in the bathroom?yes Do you have throw rugs in your house?    Cognitive Testing  Alert? Yes Normal Appearance?Yes  Oriented to person? Yes Place? Yes  Time? Yes  Recall of three objects? Yes  Can perform simple calculations? Yes  Displays  appropriate judgment?Yes  Can read the correct time from a watch face?Yes   List the Names of Other Physician/Practitioners you currently use:  See referral list for the physicians patient is currently seeing. Drs. Love, Stuckey, Cottle    Review of Systems: as above   Objective:     General appearance: Appears stated age and mildly obese  Head: Normocephalic, without obvious abnormality, atraumatic  Eyes: conj clear, EOMi PEERLA  Ears: normal TM's and external ear canals both ears  Nose: Nares normal. Septum midline. Mucosa normal. No drainage or sinus tenderness.  Throat: lips, mucosa, and tongue normal; teeth and gums normal  Neck: no adenopathy, left carotid bruit, no JVD, supple, symmetrical, trachea midline and thyroid not enlarged, symmetric, no tenderness/mass/nodules  No CVA tenderness.  Lungs: clear to auscultation bilaterally  Breasts: normal appearance, no masses or tenderness-bilateral implants Heart: regular rate and rhythm, S1, S2 normal,  click, rub or gallop  Abdomen: soft, non-tender; bowel sounds normal; no masses, no organomegaly  Musculoskeletal: ROM normal in all joints, no crepitus, no deformity, Normal  muscle strengthen. Back  is symmetric, no curvature. Skin: Skin color, texture, turgor normal. No rashes or lesions  Lymph nodes: Cervical, supraclavicular, and axillary nodes normal.  Neurologic: CN 2 -12 Normal, Normal symmetric reflexes. Normal coordination and gait  Psych: Alert & Oriented x 3, Mood appear stable.    Assessment:    Annual wellness medicare exam   Plan:    During the course of the visit the patient was educated and counseled about appropriate screening and preventive services including:        Patient Instructions (the written plan) was given to the patient.  Medicare Attestation  I have personally reviewed:  The patient's medical and social history  Their use of alcohol, tobacco or illicit drugs  Their current medications  and supplements  The patient's functional ability including ADLs,fall risks, home safety risks, cognitive, and hearing and visual impairment  Diet and physical activities  Evidence for depression or mood disorders  The patient's weight, height, BMI, and visual acuity have been recorded in the chart. I have made referrals, counseling, and provided education to the patient based on review of the above and I have provided the patient with a written personalized care plan for preventive services.

## 2012-08-07 DIAGNOSIS — F332 Major depressive disorder, recurrent severe without psychotic features: Secondary | ICD-10-CM | POA: Diagnosis not present

## 2012-08-14 ENCOUNTER — Encounter: Payer: Self-pay | Admitting: Internal Medicine

## 2012-08-14 ENCOUNTER — Ambulatory Visit (INDEPENDENT_AMBULATORY_CARE_PROVIDER_SITE_OTHER): Payer: Medicare Other | Admitting: Internal Medicine

## 2012-08-14 VITALS — BP 120/64 | HR 68 | Temp 100.1°F

## 2012-08-14 DIAGNOSIS — Z79899 Other long term (current) drug therapy: Secondary | ICD-10-CM | POA: Diagnosis not present

## 2012-08-14 DIAGNOSIS — F314 Bipolar disorder, current episode depressed, severe, without psychotic features: Secondary | ICD-10-CM | POA: Diagnosis not present

## 2012-08-14 LAB — BASIC METABOLIC PANEL
CO2: 26 mEq/L (ref 19–32)
Calcium: 9.3 mg/dL (ref 8.4–10.5)
Chloride: 99 mEq/L (ref 96–112)
Sodium: 131 mEq/L — ABNORMAL LOW (ref 135–145)

## 2012-08-14 NOTE — Patient Instructions (Addendum)
Call if not better by end of week. Just finished Z-Pak on November 9. It should stay in her system for 10 days. Start doxycycline 100 mg daily 2 days before your trip and continue for 4 weeks after your trip. Basic metabolic panel, lithium level and TSH drawn today with results to be faxed to [name].

## 2012-08-14 NOTE — Progress Notes (Signed)
  Subjective:    Patient ID: Meghan Oliver, female    DOB: 1941-03-09, 71 y.o.   MRN: [id number]  HPI 71 year old white female came down last week with URI symptoms. Was started on Zithromax Z-PAK which she finished on Saturday. Has had low-grade fever. Cough and congestion. No shaking chills. Leaving to go on a 5 week trip to [place] in about a month. Going to [facility] for Thanksgiving. Hoarseness. No complaint of ear pain.  Has a history of bipolar disorder and psychiatrist requests that lithium level be drawn today.  Also asking about malaria prophylaxis with doxycycline for upcoming travel.     Review of Systems     Objective:   Physical Exam HEENT exam: TMs are full bilaterally but not red. Pharynx only very slightly injected without exudate. Neck is supple without adenopathy. Chest clear to auscultation. No rales or wheezing.        Assessment & Plan:  URI-just finished Z-Pak Saturday, November 9. Call if not better by end of week.  Hypothyroidism-TSH checked today  Doxycycline 100 mg to start 2 days before trip, continue daily during trip, and to continue for 4 weeks after coming back from trip( #70) capsules called in to [facility].  Lithium level drawn today as well as B- met and TSH. Results to be faxed to [name].

## 2012-08-15 DIAGNOSIS — Z1231 Encounter for screening mammogram for malignant neoplasm of breast: Secondary | ICD-10-CM | POA: Diagnosis not present

## 2012-08-15 DIAGNOSIS — Z803 Family history of malignant neoplasm of breast: Secondary | ICD-10-CM | POA: Diagnosis not present

## 2012-08-15 LAB — LITHIUM LEVEL: Lithium Lvl: 1 mEq/L (ref 0.80–1.40)

## 2012-08-17 DIAGNOSIS — L821 Other seborrheic keratosis: Secondary | ICD-10-CM | POA: Diagnosis not present

## 2012-08-17 DIAGNOSIS — D239 Other benign neoplasm of skin, unspecified: Secondary | ICD-10-CM | POA: Diagnosis not present

## 2012-08-18 ENCOUNTER — Encounter: Payer: Self-pay | Admitting: Obstetrics and Gynecology

## 2012-08-22 DIAGNOSIS — M47817 Spondylosis without myelopathy or radiculopathy, lumbosacral region: Secondary | ICD-10-CM | POA: Diagnosis not present

## 2012-08-25 DIAGNOSIS — R030 Elevated blood-pressure reading, without diagnosis of hypertension: Secondary | ICD-10-CM | POA: Diagnosis not present

## 2012-08-25 DIAGNOSIS — M549 Dorsalgia, unspecified: Secondary | ICD-10-CM | POA: Diagnosis not present

## 2012-09-04 ENCOUNTER — Other Ambulatory Visit: Payer: Self-pay | Admitting: Neurology

## 2012-09-04 ENCOUNTER — Ambulatory Visit (INDEPENDENT_AMBULATORY_CARE_PROVIDER_SITE_OTHER): Payer: Medicare Other | Admitting: Internal Medicine

## 2012-09-04 DIAGNOSIS — M47817 Spondylosis without myelopathy or radiculopathy, lumbosacral region: Secondary | ICD-10-CM

## 2012-09-04 DIAGNOSIS — Z789 Other specified health status: Secondary | ICD-10-CM | POA: Diagnosis not present

## 2012-09-04 DIAGNOSIS — Z23 Encounter for immunization: Secondary | ICD-10-CM | POA: Diagnosis not present

## 2012-09-04 DIAGNOSIS — Z0000 Encounter for general adult medical examination without abnormal findings: Secondary | ICD-10-CM | POA: Diagnosis not present

## 2012-09-04 DIAGNOSIS — M4310 Spondylolisthesis, site unspecified: Secondary | ICD-10-CM

## 2012-09-04 MED ORDER — AZITHROMYCIN 500 MG PO TABS: 500.0000 mg | ORAL_TABLET | Freq: Every day | ORAL | Status: DC

## 2012-09-04 MED ORDER — ATOVAQUONE-PROGUANIL HCL 250-100 MG PO TABS: 1.0000 | ORAL_TABLET | Freq: Every day | ORAL | Status: DC

## 2012-09-04 NOTE — Progress Notes (Signed)
[facility]  RFV: pre travel counseling and vaccination for extended cruise to [place] Subjective:    Patient ID: Meghan Oliver, female    DOB: 1941/03/05, 71 y.o.   MRN: [id number]  HPI Meghan Oliver is a 71yo F with NKMA, hx of GERD, HTN, CAD, HLD, who will be traveling with her husband on an extended cruise of 30 days throughout [place].  They will stay 3 addn days in [place] visiting friends and return to the [name] on January 10th.  Itinerary includes: Dec 8-9  [place] Dec 10 [place] Dec 11 [place], [place] cruise [place] sea to  Dec 14-15 [place], [place] Dec 16 [place] Dec 17 [facility] Dec 18 [place] Dec 19-20 [place] Dec 21-22 cruise [place] ocean to Dec 23 [place] Dec 24-25 cruise [place] ocean to Dec 26 [place], [place] Dec 27 [place], [place] Dec 28 Meghan Oliver, [place] Dec 29 [place] Dec 30 [place] Dec 31 [place] Jan 2 Meghan Oliver Jan 3 [name], [place] Jan 4 [name] Jan 6-7 [place]  Imms: she has had flu vax in sep 2013   Active Ambulatory Problems    Diagnosis Date Noted  . HYPERLIPIDEMIA 08/22/2007  . BIPOLAR AFFECTIVE DISORDER 08/22/2007  . CAROTID ARTERY DISEASE 08/22/2007  . GERD 08/22/2007  . LOW BACK PAIN 08/22/2007  . OSTEOPOROSIS 08/22/2007  . INSOMNIA 08/22/2007  . SYSTOLIC MURMUR 08/22/2007  . HYPERTENSION, UNSPECIFIED 11/06/2010  . EDEMA 11/06/2010  . OSA (obstructive sleep apnea) 04/05/2011  . Chronic cough 08/12/2011  . Hypothyroidism 03/21/2012  . Coronary artery calcification seen on CAT scan 06/25/2012  . Lung nodule 06/25/2012   Resolved Ambulatory Problems    Diagnosis Date Noted  . ANXIETY 08/22/2007  . CHEST PAIN-PRECORDIAL 11/06/2010  . Hypersomnia 03/03/2011  . Depression   . UTI (lower urinary tract infection) 03/21/2012  . Near syncope 03/21/2012  . Sinus bradycardia 03/21/2012   Past Medical History  Diagnosis Date  . GERD (gastroesophageal reflux disease)   . Hyperlipidemia   .  Anxiety   . Low back pain   . Osteoporosis   . Carotid disease, bilateral   . Bipolar disease, chronic   . Hypertension        Review of Systems     Objective:   Physical Exam        Assessment & Plan:  Travel vaccines = will give hep A, typhoid fever and polio IPV today. In regards to yellow fever, we will provide a medical letter waiver to the patient due to risk of vaccine outweighs her risk of disease. She will only be in [place], [place] for 1 day during her 30 day cruise, which is the only place on her itinerary that has low-moderate risk for YF. CDC do not recommend patients to get YF if only traveling in the [place] of [place]. Due to being > 72yo, will provide waiver.  Malaria proph = will give Rx for malarone, 37 tabs. To start dec 12 th when they will begin being in endemic areas. She is to continue until her return through Jan 17th. Also recommended DEET  Travellers diarrhea= provided a FAQ sheet regarding TD. Gave rx for azithromycin.  Insomnia= will give rx for ambien 10mg  QHS PRN jet-lag #10 tabs NR

## 2012-09-06 ENCOUNTER — Ambulatory Visit
Admission: RE | Admit: 2012-09-06 | Discharge: 2012-09-06 | Disposition: A | Discharge status: Home / Self Care | Payer: Medicare Other | Source: Ambulatory Visit | Attending: Neurology | Admitting: Neurology

## 2012-09-06 ENCOUNTER — Other Ambulatory Visit: Payer: Medicare Other

## 2012-09-06 ENCOUNTER — Other Ambulatory Visit: Payer: Self-pay

## 2012-09-06 VITALS — BP 144/70 | HR 54

## 2012-09-06 DIAGNOSIS — M545 Low back pain: Secondary | ICD-10-CM | POA: Diagnosis not present

## 2012-09-06 DIAGNOSIS — M4310 Spondylolisthesis, site unspecified: Secondary | ICD-10-CM

## 2012-09-06 DIAGNOSIS — M47817 Spondylosis without myelopathy or radiculopathy, lumbosacral region: Secondary | ICD-10-CM | POA: Diagnosis not present

## 2012-09-06 DIAGNOSIS — M5126 Other intervertebral disc displacement, lumbar region: Secondary | ICD-10-CM | POA: Diagnosis not present

## 2012-09-06 MED ORDER — IOHEXOL 180 MG/ML  SOLN
1.0000 mL | Freq: Once | INTRAMUSCULAR | Status: AC | PRN
  Administered 2012-09-06: 1 mL via EPIDURAL

## 2012-09-06 MED ORDER — METHYLPREDNISOLONE ACETATE 40 MG/ML INJ SUSP (RADIOLOG
120.0000 mg | Freq: Once | INTRAMUSCULAR | Status: AC
  Administered 2012-09-06: 120 mg via EPIDURAL

## 2012-09-07 ENCOUNTER — Other Ambulatory Visit: Payer: Self-pay

## 2012-09-07 MED ORDER — ZOLPIDEM TARTRATE 5 MG PO TABS: 5.0000 mg | ORAL_TABLET | Freq: Every evening | ORAL | Status: DC | PRN

## 2012-11-06 DIAGNOSIS — M5137 Other intervertebral disc degeneration, lumbosacral region: Secondary | ICD-10-CM | POA: Diagnosis not present

## 2012-11-06 DIAGNOSIS — M5430 Sciatica, unspecified side: Secondary | ICD-10-CM | POA: Diagnosis not present

## 2012-11-20 DIAGNOSIS — M48061 Spinal stenosis, lumbar region without neurogenic claudication: Secondary | ICD-10-CM | POA: Diagnosis not present

## 2012-11-22 DIAGNOSIS — M48061 Spinal stenosis, lumbar region without neurogenic claudication: Secondary | ICD-10-CM | POA: Diagnosis not present

## 2012-11-28 DIAGNOSIS — M48061 Spinal stenosis, lumbar region without neurogenic claudication: Secondary | ICD-10-CM | POA: Diagnosis not present

## 2012-11-30 DIAGNOSIS — M48061 Spinal stenosis, lumbar region without neurogenic claudication: Secondary | ICD-10-CM | POA: Diagnosis not present

## 2012-12-01 DIAGNOSIS — M48061 Spinal stenosis, lumbar region without neurogenic claudication: Secondary | ICD-10-CM | POA: Diagnosis not present

## 2012-12-05 DIAGNOSIS — M48061 Spinal stenosis, lumbar region without neurogenic claudication: Secondary | ICD-10-CM | POA: Diagnosis not present

## 2012-12-06 ENCOUNTER — Other Ambulatory Visit: Payer: Self-pay

## 2012-12-06 ENCOUNTER — Other Ambulatory Visit: Payer: Self-pay | Admitting: *Deleted

## 2012-12-06 MED ORDER — CHLORTHALIDONE 25 MG PO TABS: 25.0000 mg | ORAL_TABLET | Freq: Every day | ORAL | Status: DC

## 2012-12-06 MED ORDER — ROSUVASTATIN CALCIUM 20 MG PO TABS: 20.0000 mg | ORAL_TABLET | Freq: Every day | ORAL | Status: DC

## 2012-12-06 MED ORDER — AMLODIPINE BESYLATE 10 MG PO TABS: 10.0000 mg | ORAL_TABLET | Freq: Every day | ORAL | Status: DC

## 2012-12-06 MED ORDER — LEVOTHYROXINE SODIUM 75 MCG PO TABS: 75.0000 ug | ORAL_TABLET | Freq: Every day | ORAL | Status: DC

## 2012-12-06 MED ORDER — LOSARTAN POTASSIUM 100 MG PO TABS: 100.0000 mg | ORAL_TABLET | Freq: Every day | ORAL | Status: DC

## 2012-12-06 MED ORDER — POTASSIUM CHLORIDE CRYS ER 20 MEQ PO TBCR: 20.0000 meq | EXTENDED_RELEASE_TABLET | Freq: Every day | ORAL | Status: DC

## 2012-12-07 DIAGNOSIS — M48061 Spinal stenosis, lumbar region without neurogenic claudication: Secondary | ICD-10-CM | POA: Diagnosis not present

## 2012-12-08 DIAGNOSIS — M48061 Spinal stenosis, lumbar region without neurogenic claudication: Secondary | ICD-10-CM | POA: Diagnosis not present

## 2012-12-11 DIAGNOSIS — M48061 Spinal stenosis, lumbar region without neurogenic claudication: Secondary | ICD-10-CM | POA: Diagnosis not present

## 2012-12-12 DIAGNOSIS — M48061 Spinal stenosis, lumbar region without neurogenic claudication: Secondary | ICD-10-CM | POA: Diagnosis not present

## 2012-12-19 DIAGNOSIS — M48061 Spinal stenosis, lumbar region without neurogenic claudication: Secondary | ICD-10-CM | POA: Diagnosis not present

## 2012-12-20 DIAGNOSIS — M48061 Spinal stenosis, lumbar region without neurogenic claudication: Secondary | ICD-10-CM | POA: Diagnosis not present

## 2012-12-22 DIAGNOSIS — M48061 Spinal stenosis, lumbar region without neurogenic claudication: Secondary | ICD-10-CM | POA: Diagnosis not present

## 2012-12-25 ENCOUNTER — Telehealth: Payer: Self-pay | Admitting: Pulmonary Disease

## 2012-12-25 DIAGNOSIS — M48061 Spinal stenosis, lumbar region without neurogenic claudication: Secondary | ICD-10-CM | POA: Diagnosis not present

## 2012-12-25 NOTE — Telephone Encounter (Signed)
I did in 2012 Ok to fill .

## 2012-12-25 NOTE — Telephone Encounter (Signed)
Pt last OV 08/12/2011. Received a fax from [facility] for a refill request on Omeprazole 40mg  1 capsule twice daily #180. I do not see where we have ever prescribed this medication for the pt. It is on her current med list. Please advise. Carron Curie, CMA No Known Allergies

## 2012-12-26 MED ORDER — OMEPRAZOLE 40 MG PO CPDR: 40.0000 mg | DELAYED_RELEASE_CAPSULE | Freq: Two times a day (BID) | ORAL | Status: DC

## 2012-12-26 NOTE — Telephone Encounter (Signed)
Refill sent. Jennifer Castillo, CMA  

## 2012-12-27 DIAGNOSIS — M48061 Spinal stenosis, lumbar region without neurogenic claudication: Secondary | ICD-10-CM | POA: Diagnosis not present

## 2013-01-01 DIAGNOSIS — M48061 Spinal stenosis, lumbar region without neurogenic claudication: Secondary | ICD-10-CM | POA: Diagnosis not present

## 2013-01-03 DIAGNOSIS — M48061 Spinal stenosis, lumbar region without neurogenic claudication: Secondary | ICD-10-CM | POA: Diagnosis not present

## 2013-01-05 DIAGNOSIS — M48061 Spinal stenosis, lumbar region without neurogenic claudication: Secondary | ICD-10-CM | POA: Diagnosis not present

## 2013-01-17 DIAGNOSIS — M48061 Spinal stenosis, lumbar region without neurogenic claudication: Secondary | ICD-10-CM | POA: Diagnosis not present

## 2013-01-19 DIAGNOSIS — M48061 Spinal stenosis, lumbar region without neurogenic claudication: Secondary | ICD-10-CM | POA: Diagnosis not present

## 2013-01-30 ENCOUNTER — Telehealth: Payer: Self-pay | Admitting: *Deleted

## 2013-01-30 NOTE — Telephone Encounter (Signed)
Pt has annual scheduled on 02/28/13 with you, she is requesting vagifem to help make exam more comfortable. Okay to send rx? Please advise

## 2013-01-31 MED ORDER — ESTRADIOL 10 MCG VA TABS: ORAL_TABLET | VAGINAL | Status: DC

## 2013-01-31 NOTE — Telephone Encounter (Signed)
Yes, please call in Vagifem 10 at bedtime x2 weeks then twice weekly.

## 2013-01-31 NOTE — Telephone Encounter (Signed)
rx sent to pharmacy

## 2013-02-12 DIAGNOSIS — F332 Major depressive disorder, recurrent severe without psychotic features: Secondary | ICD-10-CM | POA: Diagnosis not present

## 2013-02-19 ENCOUNTER — Other Ambulatory Visit: Payer: Medicare Other | Admitting: Internal Medicine

## 2013-02-19 DIAGNOSIS — Z79899 Other long term (current) drug therapy: Secondary | ICD-10-CM

## 2013-02-19 DIAGNOSIS — F332 Major depressive disorder, recurrent severe without psychotic features: Secondary | ICD-10-CM

## 2013-02-19 LAB — BASIC METABOLIC PANEL
Chloride: 103 mEq/L (ref 96–112)
Glucose, Bld: 115 mg/dL — ABNORMAL HIGH (ref 70–99)
Potassium: 3.4 mEq/L — ABNORMAL LOW (ref 3.5–5.3)
Sodium: 140 mEq/L (ref 135–145)

## 2013-02-19 LAB — TSH: TSH: 1.66 u[IU]/mL (ref 0.350–4.500)

## 2013-02-21 NOTE — Progress Notes (Signed)
Patient unsure if she is on a K+ supplement or not. Will check her meds and call us back.

## 2013-02-27 NOTE — Progress Notes (Signed)
Several phone messages left for patient. No return calls

## 2013-02-28 ENCOUNTER — Ambulatory Visit (INDEPENDENT_AMBULATORY_CARE_PROVIDER_SITE_OTHER): Payer: Medicare Other | Admitting: Women's Health

## 2013-02-28 ENCOUNTER — Encounter: Payer: Self-pay | Admitting: Women's Health

## 2013-02-28 VITALS — BP 122/72 | Ht 59.75 in | Wt 134.0 lb

## 2013-02-28 DIAGNOSIS — Z8262 Family history of osteoporosis: Secondary | ICD-10-CM | POA: Diagnosis not present

## 2013-02-28 DIAGNOSIS — N318 Other neuromuscular dysfunction of bladder: Secondary | ICD-10-CM

## 2013-02-28 DIAGNOSIS — N3281 Overactive bladder: Secondary | ICD-10-CM

## 2013-02-28 DIAGNOSIS — M899 Disorder of bone, unspecified: Secondary | ICD-10-CM | POA: Diagnosis not present

## 2013-02-28 MED ORDER — FESOTERODINE FUMARATE ER 8 MG PO TB24: 8.0000 mg | ORAL_TABLET | Freq: Every day | ORAL | Status: DC

## 2013-02-28 NOTE — Progress Notes (Signed)
Meghan Oliver 12/12/1940 [id number]    History:    The patient presents for breast and pelvic exam. Postmenopausal on no HRT with no bleeding. Numerous medical problems managed by primary care. History of osteoporosis had been on Actonel for 8 years and has been off for 2 years, DEXA scheduled today. Has had some problems with urine leakage, uses a small pad most days, minimal relief with Tovias 8 mg but would like to continue. History of a tubular adenoma on colonoscopy 2012. Normal mammogram history. Anxiety/depression/bipolar disease managed by psychiatrist. Reports having a nurse help her with medications.   Past medical history, past surgical history, family history and social history were all reviewed and documented in the EPIC chart. 2 daughters, 1 lives in [place], 1 lives in [place].   Exam:  Filed Vitals:   02/28/13 0958  BP: 122/72    General appearance:  Normal Head/Neck:  Normal, without cervical or supraclavicular adenopathy. Thyroid:  Symmetrical, normal in size, without palpable masses or nodularity. Respiratory  Effort:  Normal  Auscultation:  Clear without wheezing or rhonchi Cardiovascular  Auscultation:  Regular rate, without rubs, murmurs or gallops  Edema/varicosities:  Not grossly evident Abdominal  Soft,nontender, without masses, guarding or rebound.  Liver/spleen:  No organomegaly noted  Hernia:  None appreciated  Skin  Inspection:  Grossly normal  Palpation:  Grossly normal Neurologic/psychiatric  Orientation:  Normal with appropriate conversation.  Mood/affect:  Normal  Genitourinary    Breasts: Examined lying and sitting/bilateral implants.     Right: Without masses, retractions, discharge or axillary adenopathy.     Left: Without masses, retractions, discharge or axillary adenopathy.   Inguinal/mons:  Normal without inguinal adenopathy  External genitalia:  Normal  BUS/Urethra/Skene's glands:  Normal  Bladder:  Normal  Vagina:   Atrophic/good pelvic support  Cervix:  Normal  Uterus:   normal in size, shape and contour.  Midline and mobile  Adnexa/parametria:     Rt: Without masses or tenderness.   Lt: Without masses or tenderness.  Anus and perineum: Normal  Digital rectal exam: Normal sphincter tone without palpated masses or tenderness  Assessment/Plan:  72 y.o. M WF G3 2 P2 for breast and pelvic exam.  Urinary leakage-Tovias 8 mg Osteoporosis off actonel now since 2012 Anxiety/depression/bipolar to a psychiatrist Hypertension primary care labs and meds Forgetfulness  Plan: Has had some relief of nocturia with tovias, now getting up only twice nightly.  Tovias 8mg   prescription given, reviewed common side effects of dry mouth and constipation which she states she has always had with no change since on. Options of seeing a urologist reviewed. SBE's, continue annual mammogram, calcium rich diet, vitamin D 2000 daily encouraged. She scheduled appointment for DEXA today and will triage based on results. Home safety and fall prevention discussed. Not sexually active, history of normal Paps, no Pap today, normal Pap 2010. Home Hemoccult card given with instructions.    Harrington Challenger WHNP, 1:17 PM 02/28/2013

## 2013-02-28 NOTE — Patient Instructions (Addendum)
Health Recommendations for Postmenopausal Women Respected and ongoing research has looked at the most common causes of death, disability, and poor quality of life in postmenopausal women. The causes include heart disease, diseases of blood vessels, diabetes, depression, cancer, and bone loss (osteoporosis). Many things can be done to help lower the chances of developing these and other common problems: CARDIOVASCULAR DISEASE Heart Disease: A heart attack is a medical emergency. Know the signs and symptoms of a heart attack. Below are things women can do to reduce their risk for heart disease.   Do not smoke. If you smoke, quit.  Aim for a healthy weight. Being overweight causes many preventable deaths. Eat a healthy and balanced diet and drink an adequate amount of liquids.  Get moving. Make a commitment to be more physically active. Aim for 30 minutes of activity on most, if not all days of the week.  Eat for heart health. Choose a diet that is low in saturated fat and cholesterol and eliminate trans fat. Include whole grains, vegetables, and fruits. Read and understand the labels on food containers before buying.  Know your numbers. Ask your caregiver to check your blood pressure, cholesterol (total, HDL, LDL, triglycerides) and blood glucose. Work with your caregiver on improving your entire clinical picture.  High blood pressure. Limit or stop your table salt intake (try salt substitute and food seasonings). Avoid salty foods and drinks. Read labels on food containers before buying. Eating well and exercising can help control high blood pressure. STROKE  Stroke is a medical emergency. Stroke may be the result of a blood clot in a blood vessel in the brain or by a brain hemorrhage (bleeding). Know the signs and symptoms of a stroke. To lower the risk of developing a stroke:  Avoid fatty foods.  Quit smoking.  Control your diabetes, blood pressure, and irregular heart rate. THROMBOPHLEBITIS  (BLOOD CLOT) OF THE LEG  Becoming overweight and leading a stationary lifestyle may also contribute to developing blood clots. Controlling your diet and exercising will help lower the risk of developing blood clots. CANCER SCREENING  Breast Cancer: Take steps to reduce your risk of breast cancer.  You should practice "breast self-awareness." This means understanding the normal appearance and feel of your breasts and should include breast self-examination. Any changes detected, no matter how small, should be reported to your caregiver.  After age 40, you should have a clinical breast exam (CBE) every year.  Starting at age 40, you should consider having a mammogram (breast X-ray) every year.  If you have a family history of breast cancer, talk to your caregiver about genetic screening.  If you are at high risk for breast cancer, talk to your caregiver about having an MRI and a mammogram every year.  Intestinal or Stomach Cancer: Tests to consider are a rectal exam, fecal occult blood, sigmoidoscopy, and colonoscopy. Women who are high risk may need to be screened at an earlier age and more often.  Cervical Cancer:  Beginning at age 30, you should have a Pap test every 3 years as long as the past 3 Pap tests have been normal.  If you have had past treatment for cervical cancer or a condition that could lead to cancer, you need Pap tests and screening for cancer for at least 20 years after your treatment.  If you had a hysterectomy for a problem that was not cancer or a condition that could lead to cancer, then you no longer need Pap tests.    If you are between ages 65 and 70, and you have had normal Pap tests going back 10 years, you no longer need Pap tests.  If Pap tests have been discontinued, risk factors (such as a new sexual partner) need to be reassessed to determine if screening should be resumed.  Some medical problems can increase the chance of getting cervical cancer. In these  cases, your caregiver may recommend more frequent screening and Pap tests.  Uterine Cancer: If you have vaginal bleeding after reaching menopause, you should notify your caregiver.  Ovarian cancer: Other than yearly pelvic exams, there are no reliable tests available to screen for ovarian cancer at this time except for yearly pelvic exams.  Lung Cancer: Yearly chest X-rays can detect lung cancer and should be done on high risk women, such as cigarette smokers and women with chronic lung disease (emphysema).  Skin Cancer: A complete body skin exam should be done at your yearly examination. Avoid overexposure to the sun and ultraviolet light lamps. Use a strong sun block cream when in the sun. All of these things are important in lowering the risk of skin cancer. MENOPAUSE Menopause Symptoms: Hormone therapy products are effective for treating symptoms associated with menopause:  Moderate to severe hot flashes.  Night sweats.  Mood swings.  Headaches.  Tiredness.  Loss of sex drive.  Insomnia.  Other symptoms. Hormone replacement carries certain risks, especially in older women. Women who use or are thinking about using estrogen or estrogen with progestin treatments should discuss that with their caregiver. Your caregiver will help you understand the benefits and risks. The ideal dose of hormone replacement therapy is not known. The Food and Drug Administration (FDA) has concluded that hormone therapy should be used only at the lowest doses and for the shortest amount of time to reach treatment goals.  OSTEOPOROSIS Protecting Against Bone Loss and Preventing Fracture: If you use hormone therapy for prevention of bone loss (osteoporosis), the risks for bone loss must outweigh the risk of the therapy. Ask your caregiver about other medications known to be safe and effective for preventing bone loss and fractures. To guard against bone loss or fractures, the following is recommended:  If  you are less than age 50, take 1000 mg of calcium and at least 600 mg of Vitamin D per day.  If you are greater than age 50 but less than age 70, take 1200 mg of calcium and at least 600 mg of Vitamin D per day.  If you are greater than age 70, take 1200 mg of calcium and at least 800 mg of Vitamin D per day. Smoking and excessive alcohol intake increases the risk of osteoporosis. Eat foods rich in calcium and vitamin D and do weight bearing exercises several times a week as your caregiver suggests. DIABETES Diabetes Melitus: If you have Type I or Type 2 diabetes, you should keep your blood sugar under control with diet, exercise and recommended medication. Avoid too many sweets, starchy and fatty foods. Being overweight can make control more difficult. COGNITION AND MEMORY Cognition and Memory: Menopausal hormone therapy is not recommended for the prevention of cognitive disorders such as Alzheimer's disease or memory loss.  DEPRESSION  Depression may occur at any age, but is common in elderly women. The reasons may be because of physical, medical, social (loneliness), or financial problems and needs. If you are experiencing depression because of medical problems and control of symptoms, talk to your caregiver about this. Physical activity and   exercise may help with mood and sleep. Community and volunteer involvement may help your sense of value and worth. If you have depression and you feel that the problem is getting worse or becoming severe, talk to your caregiver about treatment options that are best for you. ACCIDENTS  Accidents are common and can be serious in the elderly woman. Prepare your house to prevent accidents. Eliminate throw rugs, place hand bars in the bath, shower and toilet areas. Avoid wearing high heeled shoes or walking on wet, snowy, and icy areas. Limit or stop driving if you have vision or hearing problems, or you feel you are unsteady with you movements and  reflexes. HEPATITIS C Hepatitis C is a type of viral infection affecting the liver. It is spread mainly through contact with blood from an infected person. It can be treated, but if left untreated, it can lead to severe liver damage over years. Many people who are infected do not know that the virus is in their blood. If you are a "baby-boomer", it is recommended that you have one screening test for Hepatitis C. IMMUNIZATIONS  Several immunizations are important to consider having during your senior years, including:   Tetanus, diptheria, and pertussis booster shot.  Influenza every year before the flu season begins.  Pneumonia vaccine.  Shingles vaccine.  Others as indicated based on your specific needs. Talk to your caregiver about these. Document Released: 11/12/2005 Document Revised: 09/06/2012 Document Reviewed: 07/08/2008 [employer] Patient Information 2014 [employer].  

## 2013-03-05 ENCOUNTER — Other Ambulatory Visit: Payer: Self-pay

## 2013-03-05 ENCOUNTER — Other Ambulatory Visit: Payer: Self-pay | Admitting: *Deleted

## 2013-03-05 DIAGNOSIS — N3281 Overactive bladder: Secondary | ICD-10-CM

## 2013-03-05 MED ORDER — FESOTERODINE FUMARATE ER 8 MG PO TB24: 8.0000 mg | ORAL_TABLET | Freq: Every day | ORAL | Status: DC

## 2013-03-05 MED ORDER — LEVOTHYROXINE SODIUM 75 MCG PO TABS: 75.0000 ug | ORAL_TABLET | Freq: Every day | ORAL | Status: DC

## 2013-03-07 ENCOUNTER — Other Ambulatory Visit: Payer: Self-pay

## 2013-03-07 MED ORDER — CELECOXIB 200 MG PO CAPS: 200.0000 mg | ORAL_CAPSULE | Freq: Every day | ORAL | Status: DC

## 2013-03-07 NOTE — Telephone Encounter (Signed)
Former [facility] patient assigned to Dr Willis  

## 2013-03-12 ENCOUNTER — Other Ambulatory Visit: Payer: Self-pay | Admitting: Pulmonary Disease

## 2013-03-12 MED ORDER — OMEPRAZOLE 40 MG PO CPDR: 40.0000 mg | DELAYED_RELEASE_CAPSULE | Freq: Two times a day (BID) | ORAL | Status: DC

## 2013-03-12 NOTE — Telephone Encounter (Signed)
[facility] requesting rx for omeprazole 40mg >< take 2 tablet daily.#180 Rx sent.  Pt needs appt

## 2013-04-12 ENCOUNTER — Encounter: Payer: Self-pay | Admitting: Women's Health

## 2013-04-12 ENCOUNTER — Ambulatory Visit (INDEPENDENT_AMBULATORY_CARE_PROVIDER_SITE_OTHER): Payer: Medicare Other | Admitting: Women's Health

## 2013-04-12 DIAGNOSIS — N898 Other specified noninflammatory disorders of vagina: Secondary | ICD-10-CM

## 2013-04-12 DIAGNOSIS — L293 Anogenital pruritus, unspecified: Secondary | ICD-10-CM

## 2013-04-12 DIAGNOSIS — N899 Noninflammatory disorder of vagina, unspecified: Secondary | ICD-10-CM | POA: Diagnosis not present

## 2013-04-12 LAB — WET PREP FOR TRICH, YEAST, CLUE: Yeast Wet Prep HPF POC: NONE SEEN

## 2013-04-12 MED ORDER — FLUCONAZOLE 150 MG PO TABS: ORAL_TABLET | ORAL | Status: DC

## 2013-04-12 NOTE — Progress Notes (Signed)
Patient ID: Meghan Oliver, female   DOB: May 04, 1941, 72 y.o.   MRN: [id number] Presents with complaint of questionable yeast infection has been on antibiotics due to a dental implant procedure. Having some external vaginal itching. Denies urinary symptoms, abdominal pain or fever.  Exam: Appears well, external genitalia erythematous at introitus, wet prep done with a Q-tip per patient's request,  wet prep: negative.  Symptomatic yeast/recent antibiotics  Plan: Diflucan 150 times one dose. Instructed to call if no relief of symptoms.

## 2013-04-13 ENCOUNTER — Other Ambulatory Visit: Payer: Self-pay

## 2013-04-13 MED ORDER — RISEDRONATE SODIUM 150 MG PO TABS: 150.0000 mg | ORAL_TABLET | ORAL | Status: DC

## 2013-04-13 MED ORDER — AMLODIPINE BESYLATE 10 MG PO TABS: 10.0000 mg | ORAL_TABLET | Freq: Every day | ORAL | Status: DC

## 2013-04-30 ENCOUNTER — Encounter (HOSPITAL_BASED_OUTPATIENT_CLINIC_OR_DEPARTMENT_OTHER): Payer: Medicare Other

## 2013-04-30 DIAGNOSIS — I6529 Occlusion and stenosis of unspecified carotid artery: Secondary | ICD-10-CM

## 2013-05-21 DIAGNOSIS — M1710 Unilateral primary osteoarthritis, unspecified knee: Secondary | ICD-10-CM | POA: Diagnosis not present

## 2013-06-05 DIAGNOSIS — M1710 Unilateral primary osteoarthritis, unspecified knee: Secondary | ICD-10-CM | POA: Diagnosis not present

## 2013-06-11 ENCOUNTER — Ambulatory Visit: Payer: Medicare Other | Admitting: Neurology

## 2013-06-22 DIAGNOSIS — M1710 Unilateral primary osteoarthritis, unspecified knee: Secondary | ICD-10-CM | POA: Diagnosis not present

## 2013-07-16 ENCOUNTER — Other Ambulatory Visit: Payer: Self-pay

## 2013-07-16 MED ORDER — AMLODIPINE BESYLATE 10 MG PO TABS: 10.0000 mg | ORAL_TABLET | Freq: Every day | ORAL | Status: DC

## 2013-07-20 ENCOUNTER — Other Ambulatory Visit: Payer: Self-pay | Admitting: *Deleted

## 2013-07-20 MED ORDER — OMEPRAZOLE 40 MG PO CPDR: 40.0000 mg | DELAYED_RELEASE_CAPSULE | Freq: Two times a day (BID) | ORAL | Status: DC

## 2013-07-24 ENCOUNTER — Telehealth: Payer: Self-pay | Admitting: Internal Medicine

## 2013-07-24 NOTE — Telephone Encounter (Signed)
We will put her on cancellation list. Can she come at short notice?

## 2013-07-25 ENCOUNTER — Ambulatory Visit: Payer: Medicare Other | Admitting: Cardiovascular Disease

## 2013-07-25 ENCOUNTER — Ambulatory Visit (INDEPENDENT_AMBULATORY_CARE_PROVIDER_SITE_OTHER): Payer: Medicare Other | Admitting: Internal Medicine

## 2013-07-25 DIAGNOSIS — Z23 Encounter for immunization: Secondary | ICD-10-CM

## 2013-07-26 ENCOUNTER — Ambulatory Visit: Payer: Medicare Other | Admitting: Internal Medicine

## 2013-07-30 ENCOUNTER — Telehealth: Payer: Self-pay | Admitting: Internal Medicine

## 2013-07-30 DIAGNOSIS — B0089 Other herpesviral infection: Secondary | ICD-10-CM

## 2013-07-30 DIAGNOSIS — F332 Major depressive disorder, recurrent severe without psychotic features: Secondary | ICD-10-CM | POA: Diagnosis not present

## 2013-07-30 MED ORDER — VALACYCLOVIR HCL 500 MG PO TABS: 500.0000 mg | ORAL_TABLET | Freq: Every day | ORAL | Status: DC

## 2013-07-30 NOTE — Telephone Encounter (Signed)
Patient complaining of fever blister after dental appointment.. prescribed Valtrex 500 mg daily for 5 days. I do not think Abreva topical medication will help very much

## 2013-07-30 NOTE — Telephone Encounter (Signed)
Cream does not help much. I have e-scribed Valtrex 500 mg daily x 5 days.

## 2013-08-06 ENCOUNTER — Encounter: Payer: Self-pay | Admitting: Neurology

## 2013-08-06 ENCOUNTER — Ambulatory Visit (INDEPENDENT_AMBULATORY_CARE_PROVIDER_SITE_OTHER): Payer: Medicare Other | Admitting: Neurology

## 2013-08-06 VITALS — BP 120/60 | HR 58 | Temp 97.9°F | Resp 12 | Ht 60.0 in | Wt 130.0 lb

## 2013-08-06 DIAGNOSIS — F325 Major depressive disorder, single episode, in full remission: Secondary | ICD-10-CM

## 2013-08-06 DIAGNOSIS — G250 Essential tremor: Secondary | ICD-10-CM

## 2013-08-06 DIAGNOSIS — G251 Drug-induced tremor: Secondary | ICD-10-CM

## 2013-08-06 DIAGNOSIS — G252 Other specified forms of tremor: Secondary | ICD-10-CM

## 2013-08-06 DIAGNOSIS — IMO0002 Reserved for concepts with insufficient information to code with codable children: Secondary | ICD-10-CM

## 2013-08-06 DIAGNOSIS — R413 Other amnesia: Secondary | ICD-10-CM | POA: Diagnosis not present

## 2013-08-06 MED ORDER — PRIMIDONE 50 MG PO TABS: 50.0000 mg | ORAL_TABLET | Freq: Every day | ORAL | Status: DC

## 2013-08-06 NOTE — Patient Instructions (Signed)
1.  Primidone - 50 mg - 1/2 tablet at night for 3-4 nights and then increase to one tablet nightly 2.  Call me and let me know how you are doing

## 2013-08-06 NOTE — Progress Notes (Signed)
Meghan Oliver was seen today in the movement disorders clinic for neurologic consultation at the request of Margaree Mackintosh, MD.  The consultation is for the evaluation of tremor.  She is accompanied by her husband who supplements the history.  She supplied numerous records I had the opportunity to review.  She was a previous patient of Dr. love.  It appears that she last saw him about one year ago, in November, 2013.  The pt reports tremor x 2 years but her husband states at least 3-4 years.  It is in both hands, R worse than L.  She is R hand dominant.  It bothers her most with drinking coffee.  She has to drink out of a straw.  She cannot eat soup.  She does not drink caffeine.  Alcohol does improve tremor.  She has trouble writing because of the tremor.    The patient does have a very long history of depression.  She is currently on lithium and Zyprexa, which she has been on for 5 years and she states that both of these help.  She is status post ECT treatments began in January, 2000 and continued for 50 treatments of December, 2010.  In the past, the patient has been on Topamax for tremor, Inderal, 10 mg twice a day (this caused decrease in heart rate) and is currently on clonazepam.  She does not even remember being on the Topamax, so cannot remember if she had side effects or if it just did not work.     PREVIOUS MEDICATIONS: Inderal (bradycardia), Topamax, clonazepam  ALLERGIES:   Allergies  Allergen Reactions  . Propranolol     CURRENT MEDICATIONS:  Current Outpatient Prescriptions on File Prior to Visit  Medication Sig Dispense Refill  . amLODipine (NORVASC) 10 MG tablet Take 1 tablet (10 mg total) by mouth daily.  90 tablet  3  . aspirin 81 MG tablet Take 81 mg by mouth daily.        Marland Kitchen atovaquone-proguanil (MALARONE) 250-100 MG TABS Take 1 tablet by mouth daily.  37 tablet  0  . Calcium Carbonate-Vitamin D (CALCIUM 600/VITAMIN D PO) Take 1 tablet by mouth 2 (two) times daily.        .  celecoxib (CELEBREX) 200 MG capsule Take 1 capsule (200 mg total) by mouth daily.  90 capsule  0  . cetirizine (ZYRTEC) 10 MG tablet Take 10 mg by mouth as needed.       . chlorthalidone (HYGROTON) 25 MG tablet Take 1 tablet (25 mg total) by mouth daily.  90 tablet  3  . Cholecalciferol (VITAMIN D3) 2000 UNITS TABS Take 2 tablets by mouth daily.       . clonazePAM (KLONOPIN) 0.5 MG tablet Take 0.25 mg by mouth as needed.       . docusate-casanthranol (PERICOLACE) 100-30 MG per capsule daily as needed.        . Estradiol (VAGIFEM) 10 MCG TABS       . Estradiol 10 MCG TABS Place 1 tablet vaginally at bedtime x 2 weeks then twice weekly.  30 tablet  0  . fesoterodine (TOVIAZ) 8 MG TB24 Take 1 tablet (8 mg total) by mouth daily.  90 tablet  4  . FLUoxetine (PROZAC) 40 MG capsule Take 40 mg by mouth daily.        Marland Kitchen levothyroxine (SYNTHROID, LEVOTHROID) 75 MCG tablet Take 1 tablet (75 mcg total) by mouth daily.  90 tablet  1  . lithium 150  MG capsule 150 every am and 300 every pm       . losartan (COZAAR) 100 MG tablet Take 1 tablet (100 mg total) by mouth daily.  90 tablet  3  . Multiple Vitamins-Minerals (MULTIVITAMIN WITH MINERALS) tablet Take 1 tablet by mouth daily.        . NON FORMULARY chlorathalidone 25mg  Daily      . OLANZapine (ZYPREXA) 5 MG tablet Take 1/2 tablet daily      . Omega-3 Fatty Acids (FISH OIL) 1200 MG CAPS Take 1 capsule by mouth daily.        Marland Kitchen omeprazole (PRILOSEC) 40 MG capsule Take 1 capsule (40 mg total) by mouth 2 (two) times daily.  180 capsule  0  . polyethylene glycol (MIRALAX / GLYCOLAX) packet Take 17 g by mouth daily as needed.        . potassium chloride SA (KLOR-CON M20) 20 MEQ tablet Take 1 tablet (20 mEq total) by mouth daily.  90 tablet  3  . rosuvastatin (CRESTOR) 20 MG tablet Take 1 tablet (20 mg total) by mouth daily.  90 tablet  3  . traMADol (ULTRAM) 50 MG tablet Take 50 mg by mouth 2 (two) times daily as needed. For back pain      . valACYclovir  (VALTREX) 500 MG tablet Take 500 mg by mouth as needed.       . valACYclovir (VALTREX) 500 MG tablet Take 1 tablet (500 mg total) by mouth daily.  5 tablet  1  . vitamin B-12 (CYANOCOBALAMIN) 1000 MCG tablet Take 1,000 mcg by mouth daily.        Marland Kitchen zolpidem (AMBIEN) 5 MG tablet Take 1 tablet (5 mg total) by mouth at bedtime as needed for sleep.  30 tablet  0  . [DISCONTINUED] fesoterodine (TOVIAZ) 8 MG TB24 Take 1 tablet (8 mg total) by mouth daily.  90 tablet  3   No current facility-administered medications on file prior to visit.    PAST MEDICAL HISTORY:   Past Medical History  Diagnosis Date  . GERD (gastroesophageal reflux disease)   . Hyperlipidemia   . Anxiety   . Low back pain   . Osteoporosis   . Carotid disease, bilateral     carotid dopplers 12/10 with 40-59% bilateral ICA stenosis  . Bipolar disease, chronic   . Hypertension   . Depression     s/p ECT    PAST SURGICAL HISTORY:   Past Surgical History  Procedure Laterality Date  . Esophagogastroduodenoscopy  02-26-04  . Appendectomy  1971  . Tubal ligation    . Augmentation mammaplasty    . Abdominal surgery      Laparotomy Ovarian cystctomy    SOCIAL HISTORY:   History   Social History  . Marital Status: Married    Spouse Name: Dr. Victorino Dike    Number of Children: 2  . Years of Education: N/A   Occupational History  . housewife    Social History Main Topics  . Smoking status: Former Smoker -- 2.00 packs/day for 15 years    Types: Cigarettes    Quit date: 01/13/1971  . Smokeless tobacco: Never Used  . Alcohol Use: 2.0 oz/week    4 drink(s) per week     Comment: wine, three times per week  . Drug Use: No  . Sexual Activity: No   Other Topics Concern  . Not on file   Social History Narrative  . No narrative on file  FAMILY HISTORY:   Family Status  Relation Status Death Age  . Father Deceased     CAD  . Mother Deceased     COPD  . Brother Alive     skin CA (? type)  . Child Alive      2, healthy    ROS:  Admits to memory changes post ECT.  Has balance changes; several falls this past year.  A complete 10 system review of systems was obtained and was unremarkable apart from what is mentioned above.  PHYSICAL EXAMINATION:    VITALS:   Filed Vitals:   08/06/13 0844  BP: 120/60  Pulse: 58  Temp: 97.9 F (36.6 C)  Resp: 12  Height: 5' (1.524 m)  Weight: 130 lb (58.968 kg)    GEN:  The patient appears stated age and is in NAD. HEENT:  Normocephalic, atraumatic.  The mucous membranes are moist. The superficial temporal arteries are without ropiness or tenderness. CV:  RRR Lungs:  CTAB Neck/HEME:  There are no carotid bruits bilaterally.  Neurological examination:  Orientation: The patient is alert and oriented x3.  She does have difficulty remembering the finer details of her history, however, and looks to her husband to help provide this. Cranial nerves: There is good facial symmetry. Pupils are equal round and reactive to light bilaterally. Fundoscopic exam reveals clear margins bilaterally. Extraocular muscles are intact. The visual fields are full to confrontational testing. The speech is fluent and clear. Soft palate rises symmetrically and there is no tongue deviation. Hearing is intact to conversational tone. Sensation: Sensation is intact to light and pinprick throughout (facial, trunk, extremities). Vibration is intact at the bilateral big toe. There is no extinction with double simultaneous stimulation. There is no sensory dermatomal level identified. Motor: Strength is 5/5 in the bilateral upper and lower extremities.   Shoulder shrug is equal and symmetric.  There is no pronator drift. Deep tendon reflexes: Deep tendon reflexes are 2+/4 at the bilateral biceps, triceps, brachioradialis, patella and achilles. Plantar responses are downgoing bilaterally.  Movement examination: Tone: There is just mildly increased tone versus paratonia in the bilateral  upper extremities.  The tone in the lower extremities is normal.  Abnormal movements: There is an irregular tremor of the outstretched hands that increases with intention Coordination:  There is no significant decremation with RAM's, including alternating supination and pronation of the forearm, hand opening and closing, finger taps, heel taps and toe taps. Gait and Station: The patient has no difficulty arising out of a deep-seated chair without the use of the hands. The patient's stride length is normal.  The patient has a negative pull test.      ASSESSMENT/PLAN:  1.  Tremor.  This is likely primarily due to lithium, although Zyprexa may contribute.  I talked to her about the fact that we can try some medications, but it likely will not get rid of the tremor altogether, but perhaps will be suppressed some.  We talked about Artane.  We also talked about primidone.  In the end, we decided on low-dose primidone.  Risks, benefits, side effects and alternative therapies were discussed.  The opportunity to ask questions was given and they were answered to the best of my ability.  The patient expressed understanding and willingness to follow the outlined treatment protocols.  -The patient goes to [place] for the winter, and will be leaving soon.  She will not return until May.  I told her that she could call me  or increase the dose, if she does not have side effects 2.  Memory changes.  I suspect that this is likely from multiple ECT treatments.  If so, it should remain stable and should not necessarily get worse with time. 3.  I will plan on seeing the patient back in may, after she returns from [place].

## 2013-08-16 ENCOUNTER — Telehealth: Payer: Self-pay

## 2013-08-16 NOTE — Telephone Encounter (Signed)
Pt called as instructed by Dr.Tat to give an update on how she is doing since last appt. She said that she feels a little better and her tremors are not as bad as they were but she has gained 3 pounds since starting the primidone. She is wondering if weight gain is a side effect of the medicine?

## 2013-08-16 NOTE — Telephone Encounter (Signed)
Generally not, especially with this very small dose.  I think that she should try and continue with it if she is willing

## 2013-08-16 NOTE — Telephone Encounter (Signed)
Called pt and relayed your message, she agreed to stay on the primidone for now.

## 2013-08-17 ENCOUNTER — Other Ambulatory Visit: Payer: Self-pay

## 2013-08-17 ENCOUNTER — Other Ambulatory Visit: Payer: Self-pay | Admitting: Pulmonary Disease

## 2013-08-17 DIAGNOSIS — N3281 Overactive bladder: Secondary | ICD-10-CM

## 2013-08-17 MED ORDER — CHLORTHALIDONE 25 MG PO TABS: 25.0000 mg | ORAL_TABLET | Freq: Every day | ORAL | Status: DC

## 2013-08-17 MED ORDER — FESOTERODINE FUMARATE ER 8 MG PO TB24: 8.0000 mg | ORAL_TABLET | Freq: Every day | ORAL | Status: DC

## 2013-08-17 MED ORDER — OMEPRAZOLE 40 MG PO CPDR: 40.0000 mg | DELAYED_RELEASE_CAPSULE | Freq: Two times a day (BID) | ORAL | Status: DC

## 2013-08-17 MED ORDER — LOSARTAN POTASSIUM 100 MG PO TABS: 100.0000 mg | ORAL_TABLET | Freq: Every day | ORAL | Status: DC

## 2013-08-17 MED ORDER — ROSUVASTATIN CALCIUM 20 MG PO TABS: 20.0000 mg | ORAL_TABLET | Freq: Every day | ORAL | Status: DC

## 2013-08-17 MED ORDER — POTASSIUM CHLORIDE CRYS ER 20 MEQ PO TBCR: 20.0000 meq | EXTENDED_RELEASE_TABLET | Freq: Every day | ORAL | Status: DC

## 2013-08-20 DIAGNOSIS — Z1231 Encounter for screening mammogram for malignant neoplasm of breast: Secondary | ICD-10-CM | POA: Diagnosis not present

## 2013-08-20 DIAGNOSIS — Z803 Family history of malignant neoplasm of breast: Secondary | ICD-10-CM | POA: Diagnosis not present

## 2013-08-20 DIAGNOSIS — Z961 Presence of intraocular lens: Secondary | ICD-10-CM | POA: Diagnosis not present

## 2013-09-05 ENCOUNTER — Encounter: Payer: Self-pay | Admitting: *Deleted

## 2013-09-05 ENCOUNTER — Ambulatory Visit (INDEPENDENT_AMBULATORY_CARE_PROVIDER_SITE_OTHER)
Admission: RE | Admit: 2013-09-05 | Discharge: 2013-09-05 | Disposition: A | Discharge status: Home / Self Care | Payer: Medicare Other | Source: Ambulatory Visit | Attending: Cardiology | Admitting: Cardiology

## 2013-09-05 ENCOUNTER — Encounter: Payer: Self-pay | Admitting: Cardiology

## 2013-09-05 ENCOUNTER — Ambulatory Visit (INDEPENDENT_AMBULATORY_CARE_PROVIDER_SITE_OTHER): Payer: Medicare Other | Admitting: Cardiology

## 2013-09-05 ENCOUNTER — Ambulatory Visit (HOSPITAL_COMMUNITY): Payer: Medicare Other | Attending: Cardiology | Admitting: Radiology

## 2013-09-05 VITALS — BP 112/60 | HR 48 | Ht 59.0 in | Wt 131.0 lb

## 2013-09-05 DIAGNOSIS — E039 Hypothyroidism, unspecified: Secondary | ICD-10-CM

## 2013-09-05 DIAGNOSIS — Z87891 Personal history of nicotine dependence: Secondary | ICD-10-CM | POA: Insufficient documentation

## 2013-09-05 DIAGNOSIS — G4733 Obstructive sleep apnea (adult) (pediatric): Secondary | ICD-10-CM | POA: Insufficient documentation

## 2013-09-05 DIAGNOSIS — R0602 Shortness of breath: Secondary | ICD-10-CM

## 2013-09-05 DIAGNOSIS — I2510 Atherosclerotic heart disease of native coronary artery without angina pectoris: Secondary | ICD-10-CM | POA: Diagnosis not present

## 2013-09-05 DIAGNOSIS — I359 Nonrheumatic aortic valve disorder, unspecified: Secondary | ICD-10-CM | POA: Insufficient documentation

## 2013-09-05 DIAGNOSIS — J984 Other disorders of lung: Secondary | ICD-10-CM | POA: Diagnosis not present

## 2013-09-05 DIAGNOSIS — Z79899 Other long term (current) drug therapy: Secondary | ICD-10-CM

## 2013-09-05 DIAGNOSIS — I10 Essential (primary) hypertension: Secondary | ICD-10-CM

## 2013-09-05 DIAGNOSIS — F332 Major depressive disorder, recurrent severe without psychotic features: Secondary | ICD-10-CM

## 2013-09-05 DIAGNOSIS — E785 Hyperlipidemia, unspecified: Secondary | ICD-10-CM | POA: Insufficient documentation

## 2013-09-05 DIAGNOSIS — R911 Solitary pulmonary nodule: Secondary | ICD-10-CM

## 2013-09-05 DIAGNOSIS — I079 Rheumatic tricuspid valve disease, unspecified: Secondary | ICD-10-CM | POA: Insufficient documentation

## 2013-09-05 DIAGNOSIS — I6529 Occlusion and stenosis of unspecified carotid artery: Secondary | ICD-10-CM

## 2013-09-05 NOTE — Progress Notes (Signed)
Echocardiogram performed.  

## 2013-09-05 NOTE — Patient Instructions (Signed)
Your physician recommends that you return for lab work Thursday or Friday.---BMET/BNP/TSH/Liver profile/Lipid profile/Lithium level.  Your physician has requested that you have an echocardiogram. Echocardiography is a painless test that uses sound waves to create images of your heart. It provides your doctor with information about the size and shape of your heart and how well your heart's chambers and valves are working. This procedure takes approximately one hour. There are no restrictions for this procedure. Thursday or Friday.  Schedule an appointment for a non contrast CT of your chest. Thursday or Friday.   Your physician has requested that you have a carotid duplex. This test is an ultrasound of the carotid arteries in your neck. It looks at blood flow through these arteries that supply the brain with blood. Allow one hour for this exam. There are no restrictions or special instructions. July 2015  Your physician wants you to follow-up in: October 2015 with Dr Shirlee Latch.You will receive a reminder letter in the mail two months in advance. If you don't receive a letter, please call our office to schedule the follow-up appointment.

## 2013-09-06 ENCOUNTER — Other Ambulatory Visit (INDEPENDENT_AMBULATORY_CARE_PROVIDER_SITE_OTHER): Payer: Medicare Other

## 2013-09-06 DIAGNOSIS — R911 Solitary pulmonary nodule: Secondary | ICD-10-CM

## 2013-09-06 DIAGNOSIS — I10 Essential (primary) hypertension: Secondary | ICD-10-CM

## 2013-09-06 DIAGNOSIS — I2510 Atherosclerotic heart disease of native coronary artery without angina pectoris: Secondary | ICD-10-CM | POA: Diagnosis not present

## 2013-09-06 DIAGNOSIS — Z79899 Other long term (current) drug therapy: Secondary | ICD-10-CM

## 2013-09-06 DIAGNOSIS — I6529 Occlusion and stenosis of unspecified carotid artery: Secondary | ICD-10-CM | POA: Diagnosis not present

## 2013-09-06 DIAGNOSIS — E039 Hypothyroidism, unspecified: Secondary | ICD-10-CM

## 2013-09-06 DIAGNOSIS — F332 Major depressive disorder, recurrent severe without psychotic features: Secondary | ICD-10-CM

## 2013-09-06 DIAGNOSIS — R0609 Other forms of dyspnea: Secondary | ICD-10-CM

## 2013-09-06 DIAGNOSIS — R0602 Shortness of breath: Secondary | ICD-10-CM

## 2013-09-06 LAB — BASIC METABOLIC PANEL
BUN: 27 mg/dL — ABNORMAL HIGH (ref 6–23)
Calcium: 9.3 mg/dL (ref 8.4–10.5)
Chloride: 107 mEq/L (ref 96–112)
Creatinine, Ser: 0.9 mg/dL (ref 0.4–1.2)
GFR: 64.29 mL/min (ref >60.00)
Glucose, Bld: 94 mg/dL (ref 70–99)

## 2013-09-06 LAB — HEPATIC FUNCTION PANEL
Albumin: 3.7 g/dL (ref 3.5–5.2)
Alkaline Phosphatase: 36 U/L — ABNORMAL LOW (ref 39–117)
Bilirubin, Direct: 0 mg/dL (ref 0.0–0.3)

## 2013-09-06 LAB — LIPID PANEL
LDL Cholesterol: 39 mg/dL (ref 0–99)
Total CHOL/HDL Ratio: 1

## 2013-09-06 NOTE — Progress Notes (Signed)
Patient ID: Meghan Oliver, female   DOB: 09-06-1941, 72 y.o.   MRN: [id number] PCP: Dr. Lenord Fellers  72 yo with history of bipolar disorder, carotid stenosis, HTN and hyperlipidemia presents for cardiology followup.  She has been seen by Dr. Riley Kill in the past and is seeing me for the first time today.  She has been stable recently.  No chest pain.  She is winded with a flight of steps.  The dyspnea walking up stairs or an incline seems to be progressive over the last 6-8 months.  She walks her dog and rides her exercise bike with no problems.  No syncope or lightheadedness.  Being evaluated by neurology for tremor.   ECG: NSR at 48, nonspecific T wave flattening (similar to prior ECG)  PMH: 1. GERD 2. Bipolar disorder: On lithium. History of ECT.  3. HTN 4. Tremor 5. Hyperlipidemia 6. Carotid stenosis: Carotid dopplers (7/14) with 40-59% bilateral ICA stenosis.  7. CAD: Coronary calcium seen on CT.  ETT (7/13) with 5'45" exercise, 71% MPHR (wanted to stop), no ischemic ECG changes but did not reach target heart rate.  8. Echo (2/12) with EF 55-60%, mild AI and MR, PA systolic pressure 36 mmHg.  9. Lung nodules 10. Bradycardia: asymptomatic  SH: Married to [employer], quit smoking in 1972, 2 daughters.  FH: Father with CAD.  ROS: All systems reviewed and negative except as per HPI.   Current Outpatient Prescriptions  Medication Sig Dispense Refill  . amLODipine (NORVASC) 10 MG tablet Take 1 tablet (10 mg total) by mouth daily.  90 tablet  3  . aspirin 81 MG tablet Take 81 mg by mouth daily.        . Calcium Carbonate-Vitamin D (CALCIUM 600/VITAMIN D PO) Take 1 tablet by mouth 2 (two) times daily.        . celecoxib (CELEBREX) 200 MG capsule Take 1 capsule (200 mg total) by mouth daily.  90 capsule  0  . cetirizine (ZYRTEC) 10 MG tablet Take 10 mg by mouth as needed.       . chlorthalidone (HYGROTON) 25 MG tablet Take 1 tablet (25 mg total) by mouth daily.  90 tablet  0  . Cholecalciferol  (VITAMIN D3) 2000 UNITS TABS Take 2 tablets by mouth daily.       . clonazePAM (KLONOPIN) 0.5 MG tablet Take 0.25 mg by mouth as needed.       . docusate-casanthranol (PERICOLACE) 100-30 MG per capsule daily as needed.        . Estradiol (VAGIFEM) 10 MCG TABS as needed.       . fesoterodine (TOVIAZ) 8 MG TB24 tablet Take 1 tablet (8 mg total) by mouth daily.  90 tablet  1  . FLUoxetine (PROZAC) 40 MG capsule Take 40 mg by mouth daily.        Marland Kitchen levothyroxine (SYNTHROID, LEVOTHROID) 75 MCG tablet Take 1 tablet (75 mcg total) by mouth daily.  90 tablet  1  . lithium 150 MG capsule 150 every am and 300 every pm       . losartan (COZAAR) 100 MG tablet Take 1 tablet (100 mg total) by mouth daily.  90 tablet  0  . Multiple Vitamins-Minerals (MULTIVITAMIN WITH MINERALS) tablet Take 1 tablet by mouth daily.        . NON FORMULARY chlorathalidone 25mg  Daily      . OLANZapine (ZYPREXA) 5 MG tablet Take 1/2 tablet daily      . Omega-3 Fatty Acids (  FISH OIL) 1200 MG CAPS Take 1 capsule by mouth daily.        Marland Kitchen omeprazole (PRILOSEC) 40 MG capsule Take 1 capsule (40 mg total) by mouth 2 (two) times daily.  180 capsule  4  . polyethylene glycol (MIRALAX / GLYCOLAX) packet Take 17 g by mouth daily as needed.        . potassium chloride SA (KLOR-CON M20) 20 MEQ tablet Take 1 tablet (20 mEq total) by mouth daily.  90 tablet  0  . primidone (MYSOLINE) 50 MG tablet Take 1 tablet (50 mg total) by mouth at bedtime.  30 tablet  5  . rosuvastatin (CRESTOR) 20 MG tablet Take 1 tablet (20 mg total) by mouth daily.  90 tablet  0  . traMADol (ULTRAM) 50 MG tablet Take 50 mg by mouth 2 (two) times daily as needed. For back pain      . valACYclovir (VALTREX) 500 MG tablet Take 500 mg by mouth as needed.       . vitamin B-12 (CYANOCOBALAMIN) 1000 MCG tablet Take 1,000 mcg by mouth daily.        Marland Kitchen zolpidem (AMBIEN) 5 MG tablet Take 1 tablet (5 mg total) by mouth at bedtime as needed for sleep.  30 tablet  0  . [DISCONTINUED]  fesoterodine (TOVIAZ) 8 MG TB24 Take 1 tablet (8 mg total) by mouth daily.  90 tablet  3   No current facility-administered medications for this visit.    BP 112/60  Pulse 48  Ht 4\' 11"  (1.499 m)  Wt 59.421 kg (131 lb)  BMI 26.44 kg/m2 General: NAD Neck: No JVD, no thyromegaly or thyroid nodule.  Lungs: Clear to auscultation bilaterally with normal respiratory effort. CV: Nondisplaced PMI.  Heart regular S1/S2, no S3/S4, 1/6 SEM.  No peripheral edema.  No carotid bruit.  Normal pedal pulses.  Abdomen: Soft, nontender, no hepatosplenomegaly, no distention.  Skin: Intact without lesions or rashes.  Neurologic: Alert and oriented x 3.  Psych: Normal affect. Extremities: No clubbing or cyanosis.   Assessment/Plan: 1. Carotid stenosis: Repeat carotid dopplers in 7/15.  2. CAD: Coronary calcium noted on CT.  ETT was normal in 7/13.  I will have her continue ASA 81 mg daily and Crestor 20 daily.  No ischemic symptoms.  3. Exertional dyspnea: Somewhat progressive over the last 6-8 months.  She is not volume overloaded on exam.  I will get an echo to assess LV and RV function.  I will check a BNP today.  4. Lung nodules: From Dr. Rosalyn Charters last note, it would appear that it is time to screen Mrs Mcaulay's chest with a noncontrast CT scan to follow her nodule.  5. Bradycardia: Mild, asymptomatic.  Avoid nodal blocking agents.   6. HTN: BP is under good control. 7. Hyperlipidemia: Check lipids today, goal LDL < 70 given carotid stenosis and CAD.   Marca Ancona 09/06/2013

## 2013-09-20 DIAGNOSIS — J209 Acute bronchitis, unspecified: Secondary | ICD-10-CM | POA: Diagnosis not present

## 2013-09-20 DIAGNOSIS — I10 Essential (primary) hypertension: Secondary | ICD-10-CM | POA: Diagnosis not present

## 2013-09-24 DIAGNOSIS — R259 Unspecified abnormal involuntary movements: Secondary | ICD-10-CM | POA: Diagnosis not present

## 2013-10-12 ENCOUNTER — Other Ambulatory Visit: Payer: Self-pay | Admitting: *Deleted

## 2013-10-12 MED ORDER — OMEPRAZOLE 40 MG PO CPDR: 40.0000 mg | DELAYED_RELEASE_CAPSULE | Freq: Two times a day (BID) | ORAL | Status: DC

## 2013-11-07 ENCOUNTER — Telehealth: Payer: Self-pay | Admitting: Internal Medicine

## 2013-11-07 NOTE — Telephone Encounter (Signed)
Number given to Dr. Olevia Perches. Left a message for patient that she is scheduled on 11/19/13 at 1:30 PM and requested she call to confirm receipt.

## 2013-11-07 NOTE — Telephone Encounter (Signed)
I will see her on Feb 16, 1.30 pm in the office. I would like to call her in [place] , can you get the #? Please?  Thanx DB

## 2013-11-07 NOTE — Telephone Encounter (Signed)
Patient is flying into [place] and will only be here on Monday 11/19/13. She is having problems with diarrhea and is asking to be seen that day. Dr. Olevia Perches is not in the office that day. Please, advise.

## 2013-11-08 NOTE — Telephone Encounter (Signed)
MD spoke with patient and she will come at 1:00 PM on 11/19/13.

## 2013-11-12 ENCOUNTER — Telehealth: Payer: Self-pay | Admitting: *Deleted

## 2013-11-12 DIAGNOSIS — K219 Gastro-esophageal reflux disease without esophagitis: Secondary | ICD-10-CM

## 2013-11-12 DIAGNOSIS — R197 Diarrhea, unspecified: Secondary | ICD-10-CM

## 2013-11-12 DIAGNOSIS — R109 Unspecified abdominal pain: Secondary | ICD-10-CM

## 2013-11-12 DIAGNOSIS — D649 Anemia, unspecified: Secondary | ICD-10-CM

## 2013-11-12 DIAGNOSIS — D509 Iron deficiency anemia, unspecified: Secondary | ICD-10-CM

## 2013-11-12 DIAGNOSIS — D519 Vitamin B12 deficiency anemia, unspecified: Secondary | ICD-10-CM

## 2013-11-12 NOTE — Telephone Encounter (Signed)
Patient notified

## 2013-11-12 NOTE — Telephone Encounter (Signed)
Received a voice mail from patient. She is calling to report she got my message about her appointment on 11/19/13. She is also asking if she can have a lithium level drawn at our office. Please, advise.

## 2013-11-12 NOTE — Telephone Encounter (Signed)
OK to draw Lithium level in our office, also B12 and CBC,Iron studies

## 2013-11-13 ENCOUNTER — Encounter: Payer: Self-pay | Admitting: *Deleted

## 2013-11-19 ENCOUNTER — Ambulatory Visit (INDEPENDENT_AMBULATORY_CARE_PROVIDER_SITE_OTHER): Payer: Medicare Other | Admitting: Internal Medicine

## 2013-11-19 ENCOUNTER — Encounter: Payer: Self-pay | Admitting: Internal Medicine

## 2013-11-19 ENCOUNTER — Other Ambulatory Visit (INDEPENDENT_AMBULATORY_CARE_PROVIDER_SITE_OTHER): Payer: Medicare Other

## 2013-11-19 ENCOUNTER — Other Ambulatory Visit: Payer: Medicare Other | Admitting: Internal Medicine

## 2013-11-19 VITALS — BP 110/60 | HR 64 | Ht 59.0 in

## 2013-11-19 DIAGNOSIS — K219 Gastro-esophageal reflux disease without esophagitis: Secondary | ICD-10-CM | POA: Diagnosis not present

## 2013-11-19 DIAGNOSIS — D519 Vitamin B12 deficiency anemia, unspecified: Secondary | ICD-10-CM

## 2013-11-19 DIAGNOSIS — K5900 Constipation, unspecified: Secondary | ICD-10-CM | POA: Diagnosis not present

## 2013-11-19 DIAGNOSIS — N8184 Pelvic muscle wasting: Secondary | ICD-10-CM

## 2013-11-19 DIAGNOSIS — M6289 Other specified disorders of muscle: Secondary | ICD-10-CM

## 2013-11-19 DIAGNOSIS — R109 Unspecified abdominal pain: Secondary | ICD-10-CM | POA: Diagnosis not present

## 2013-11-19 DIAGNOSIS — D509 Iron deficiency anemia, unspecified: Secondary | ICD-10-CM | POA: Diagnosis not present

## 2013-11-19 DIAGNOSIS — D518 Other vitamin B12 deficiency anemias: Secondary | ICD-10-CM

## 2013-11-19 DIAGNOSIS — D649 Anemia, unspecified: Secondary | ICD-10-CM | POA: Diagnosis not present

## 2013-11-19 DIAGNOSIS — R197 Diarrhea, unspecified: Secondary | ICD-10-CM | POA: Diagnosis not present

## 2013-11-19 DIAGNOSIS — F332 Major depressive disorder, recurrent severe without psychotic features: Secondary | ICD-10-CM | POA: Diagnosis not present

## 2013-11-19 LAB — CBC WITH DIFFERENTIAL/PLATELET
BASOS ABS: 0 10*3/uL (ref 0.0–0.1)
Basophils Relative: 0.3 % (ref 0.0–3.0)
EOS ABS: 0.2 10*3/uL (ref 0.0–0.7)
Eosinophils Relative: 2.7 % (ref 0.0–5.0)
HCT: 36.2 % (ref 36.0–46.0)
HEMOGLOBIN: 11.7 g/dL — AB (ref 12.0–15.0)
LYMPHS PCT: 23 % (ref 12.0–46.0)
Lymphs Abs: 1.8 10*3/uL (ref 0.7–4.0)
MCHC: 32.3 g/dL (ref 30.0–36.0)
MCV: 101 fl — ABNORMAL HIGH (ref 78.0–100.0)
Monocytes Absolute: 0.5 10*3/uL (ref 0.1–1.0)
Monocytes Relative: 6.9 % (ref 3.0–12.0)
NEUTROS ABS: 5.2 10*3/uL (ref 1.4–7.7)
Neutrophils Relative %: 67.1 % (ref 43.0–77.0)
PLATELETS: 248 10*3/uL (ref 150.0–400.0)
RBC: 3.58 Mil/uL — ABNORMAL LOW (ref 3.87–5.11)
RDW: 13.8 % (ref 11.5–14.6)
WBC: 7.8 10*3/uL (ref 4.5–10.5)

## 2013-11-19 LAB — IBC PANEL
IRON: 88 ug/dL (ref 42–145)
SATURATION RATIOS: 25.3 % (ref 20.0–50.0)
Transferrin: 248.4 mg/dL (ref 212.0–360.0)

## 2013-11-19 LAB — VITAMIN B12: Vitamin B-12: 1160 pg/mL — ABNORMAL HIGH (ref 211–911)

## 2013-11-19 MED ORDER — LINACLOTIDE 145 MCG PO CAPS: 145.0000 ug | ORAL_CAPSULE | Freq: Every day | ORAL | Status: DC

## 2013-11-19 MED ORDER — BENEFIBER PO POWD: ORAL | Status: DC

## 2013-11-19 NOTE — Progress Notes (Signed)
[name] April 02, 1941 [id number]  Note: This dictation was prepared with Dragon digital system. Any transcriptional errors that result from this procedure are unintentional.   History of Present Illness:  This is a 73 year old white female, wife of Meghan Oliver, who is here today to discuss irregular bowel habits, specifically urgency and occasional accidents.without warning. She has a hx of functional constipation . In recent years, she would have bowel movements up to every 6 days, with intermittent diarrhea and urgency resulting in incontinence. She has not had any warning before the bowel movements. This has been very embarrassing and difficult for her in social situations. She has taken MiraLax 17 g daily and subsequently decreased to 9 g daily with some improvement of the constipation but with increasing frequency of accidents. We have seen her in the past for a colorectal screening. A colonoscopy in 1999 and again in November 2012 showed a tubular adenoma. Other  medical problems include essential hypertension, hyperlipidemia, overactive bladder, hypothyroidism, bipolar disease for which she takes lithium, Zyprexa Prozac(  Dr Meghan Oliver), osteoporosis and tremor secondary to lithium. ( Dr Meghan Oliver) She and her husband have a home in [place], where there  are spending the winters. They will be returning to [place] tomorrow. She denies any abdominal pain, rectal pain or rectal bleeding. In fact, she does not complain of any symptoms when she is constipated such as rectal fullness, abd.pain or distention. She reports taking laxatives because she feels that she ought to go more. often. On our prior evaluations she was found to have a decreased rectal sphincter tone.   Past Medical History  Diagnosis Date  . GERD (gastroesophageal reflux disease)   . Hyperlipidemia   . Anxiety   . Low back pain   . Osteoporosis   . Carotid disease, bilateral     carotid dopplers 12/10 with 40-59% bilateral ICA  stenosis  . Bipolar disease, chronic   . Hypertension   . Depression     s/p ECT  . Migraines   . Heart murmur   . Carotid bruit     Past Surgical History  Procedure Laterality Date  . Esophagogastroduodenoscopy  02-26-04  . Appendectomy  1971  . Tubal ligation    . Augmentation mammaplasty    . Abdominal surgery      Laparotomy Ovarian cystctomy    Allergies  Allergen Reactions  . Propranolol     Family history and social history have been reviewed.  Review of Systems:   The remainder of the 10 point ROS is negative except as outlined in the H&P  Physical Exam: General Appearance Well developed, in no distress Eyes  Non icteric  HEENT  Non traumatic, normocephalic  Mouth No lesion, tongue papillated, no cheilosis Neck Supple without adenopathy, thyroid not enlarged, no carotid bruits, no JVD Lungs Clear to auscultation bilaterally COR Normal S1, normal S2, regular rhythm, no murmur, quiet precordium Abdomen Soft nontender abdomen with normoactive bowel sounds. No tenderness. No palpable mass. Liver edge at costal margin  Rectal Decreased rectal sphincter tone. Small amount of formed Hemoccult-negative stool in the ampulla. No perianal erythema or irritation.  Extremities  No pedal edema Skin No lesions Neurological Alert and oriented x 3 Psychological Normal mood and affect  Assessment and Plan:   Problem #63 73 year old white female with a history of functional constipation now with poor control of her bowel habits resulting in occasional accidents, especially after taking laxatives. She is very distraught about lack of control over her bowel  habits. Her diet consists of foods low in fiber.She reports not eating vegetables or other high fiber foods. I believe the MiraLax is too strong for her. She does not feel uncomfortable when she does not have a bowel movement for several days so I don't see any urgent need for her to push the laxatives.. We will start her on  Benefiber, one heaping teaspoon daily which will increase the bulk of the stool and later on, she may start Linzess 145 ug daily if she is not satisfied with only Benefiber. Benefiber should  be continued long-term. Linzess 145 ug may be added on as needed basis.Marland Kitchen She was instructed to follow a high-fiber diet and increase physical exercises to strengthen the pelvic floor. I believe the psychotropic medication may be contributing to the diminished sensations to defacate, leading to sudden accidents.  . She will let me know how she does when she returns to [facility] in April.  Problem #2 Colorectal screening. Patient is due for a recall colonoscopy in 08/05/2016. Her last colonoscopy was in 2012 at which time a tubular adenoma removed.  Today we are checking CBC, Lithium level, Iron panel and B12.   Meghan Oliver 11/19/2013

## 2013-11-19 NOTE — Patient Instructions (Addendum)
Please purchase the following medications over the counter and take as directed: Benefiber 1 heaping teaspoon every morning , reassess in 4 weeks to see if it is helping.  If Benefiber is not helpful, you may try Linzess 145 mcg once daily. If Linzess is effective, we will be glad to send you a prescription.  Please discontinue Miralax.  You will be due for a recall colonoscopy in 08/2016. We will send you a reminder in the mail when it gets closer to that time.  CC: Dr Renold Genta, Dr Lynder Parents, Dr Tat  High-Fiber Diet Fiber is found in fruits, vegetables, and grains. A high-fiber diet encourages the addition of more whole grains, legumes, fruits, and vegetables in your diet. The recommended amount of fiber for adult males is 38 g per day. For adult females, it is 25 g per day. Pregnant and lactating women should get 28 g of fiber per day. If you have a digestive or bowel problem, ask your caregiver for advice before adding high-fiber foods to your diet. Eat a variety of high-fiber foods instead of only a select few type of foods.  PURPOSE  To increase stool bulk.  To make bowel movements more regular to prevent constipation.  To lower cholesterol.  To prevent overeating. WHEN IS THIS DIET USED?  It may be used if you have constipation and hemorrhoids.  It may be used if you have uncomplicated diverticulosis (intestine condition) and irritable bowel syndrome.  It may be used if you need help with weight management.  It may be used if you want to add it to your diet as a protective measure against atherosclerosis, diabetes, and cancer. SOURCES OF FIBER  Whole-grain breads and cereals.  Fruits, such as apples, oranges, bananas, berries, prunes, and pears.  Vegetables, such as green peas, carrots, sweet potatoes, beets, broccoli, cabbage, spinach, and artichokes.  Legumes, such split peas, soy, lentils.  Almonds. FIBER CONTENT IN FOODS Starches and Grains / Dietary Fiber  (g)  Cheerios, 1 cup / 3 g  Corn Flakes cereal, 1 cup / 0.7 g  Rice crispy treat cereal, 1 cup / 0.3 g  Instant oatmeal (cooked),  cup / 2 g  Frosted wheat cereal, 1 cup / 5.1 g  Brown, long-grain rice (cooked), 1 cup / 3.5 g  White, long-grain rice (cooked), 1 cup / 0.6 g  Enriched macaroni (cooked), 1 cup / 2.5 g Legumes / Dietary Fiber (g)  Baked beans (canned, plain, or vegetarian),  cup / 5.2 g  Kidney beans (canned),  cup / 6.8 g  Pinto beans (cooked),  cup / 5.5 g Breads and Crackers / Dietary Fiber (g)  Plain or honey graham crackers, 2 squares / 0.7 g  Saltine crackers, 3 squares / 0.3 g  Plain, salted pretzels, 10 pieces / 1.8 g  Whole-wheat bread, 1 slice / 1.9 g  White bread, 1 slice / 0.7 g  Raisin bread, 1 slice / 1.2 g  Plain bagel, 3 oz / 2 g  Flour tortilla, 1 oz / 0.9 g  Corn tortilla, 1 small / 1.5 g  Hamburger or hotdog bun, 1 small / 0.9 g Fruits / Dietary Fiber (g)  Apple with skin, 1 medium / 4.4 g  Sweetened applesauce,  cup / 1.5 g  Banana,  medium / 1.5 g  Grapes, 10 grapes / 0.4 g  Orange, 1 small / 2.3 g  Raisin, 1.5 oz / 1.6 g  Melon, 1 cup / 1.4 g Vegetables /  Dietary Fiber (g)  Green beans (canned),  cup / 1.3 g  Carrots (cooked),  cup / 2.3 g  Broccoli (cooked),  cup / 2.8 g  Peas (cooked),  cup / 4.4 g  Mashed potatoes,  cup / 1.6 g  Lettuce, 1 cup / 0.5 g  Corn (canned),  cup / 1.6 g  Tomato,  cup / 1.1 g Document Released: 09/20/2005 Document Revised: 03/21/2012 Document Reviewed: 12/23/2011 [facility] Patient Information 2014 [place], [place]. ,

## 2013-11-20 LAB — LITHIUM LEVEL: Lithium Lvl: 0.5 mEq/L — ABNORMAL LOW (ref 0.80–1.40)

## 2014-01-16 ENCOUNTER — Telehealth: Payer: Self-pay | Admitting: *Deleted

## 2014-01-16 NOTE — Telephone Encounter (Signed)
Called [phone] to initiate the PA for the omeprazole 40 mg  BID.  Waiting on fax.

## 2014-01-16 NOTE — Telephone Encounter (Signed)
Form received. Will give to [facility] in the AM to review/sign.

## 2014-01-17 NOTE — Telephone Encounter (Signed)
Called the insurance company and this medication is a tier 1 but they have to do the quantity limit exception since the pt takes  This medication BID.  Form was given back to ashtyn.  Wood please advise .  Thanks

## 2014-01-17 NOTE — Telephone Encounter (Signed)
PA Forms placed in Dr Janifer Adie Brentlee Sciara folder to review/sign. Please return to me once complete. Thanks.

## 2014-01-17 NOTE — Telephone Encounter (Signed)
Do not do prior auths if there are alternatives See if something else is covered.

## 2014-01-18 ENCOUNTER — Telehealth: Payer: Self-pay | Admitting: Pulmonary Disease

## 2014-01-18 NOTE — Telephone Encounter (Signed)
done

## 2014-01-18 NOTE — Telephone Encounter (Signed)
Called [employer] and spoke with Deshaun C.  I was then transferred to [place]. Gave her DX code and alternatives pt has tried/failed. She will submit this for review. Nothing further needed

## 2014-01-24 NOTE — Telephone Encounter (Signed)
Called [phone] spoke with Deanne. Was advised this was approved 01/18/14-01/19/15.  [facility] is already aware.  Did not have an approval # per [facility] since this was for medication Called pt to make aware of approval and had to [facility] x1

## 2014-01-25 NOTE — Telephone Encounter (Signed)
Spoke with friend who is currently house sitting-- pt is out of town.  Will return home tomorrow. LM to contact our office

## 2014-01-28 ENCOUNTER — Encounter: Payer: Self-pay | Admitting: Internal Medicine

## 2014-01-28 ENCOUNTER — Ambulatory Visit (INDEPENDENT_AMBULATORY_CARE_PROVIDER_SITE_OTHER): Payer: Medicare Other | Admitting: Internal Medicine

## 2014-01-28 VITALS — BP 136/76 | HR 84 | Temp 99.2°F

## 2014-01-28 DIAGNOSIS — H6590 Unspecified nonsuppurative otitis media, unspecified ear: Secondary | ICD-10-CM | POA: Diagnosis not present

## 2014-01-28 DIAGNOSIS — J209 Acute bronchitis, unspecified: Secondary | ICD-10-CM | POA: Diagnosis not present

## 2014-01-28 DIAGNOSIS — F332 Major depressive disorder, recurrent severe without psychotic features: Secondary | ICD-10-CM | POA: Diagnosis not present

## 2014-01-28 DIAGNOSIS — H6591 Unspecified nonsuppurative otitis media, right ear: Secondary | ICD-10-CM

## 2014-01-28 MED ORDER — AMOXICILLIN 500 MG PO CAPS: 500.0000 mg | ORAL_CAPSULE | Freq: Three times a day (TID) | ORAL | Status: DC

## 2014-01-28 MED ORDER — BENZONATATE 100 MG PO CAPS: 200.0000 mg | ORAL_CAPSULE | Freq: Three times a day (TID) | ORAL | Status: DC | PRN

## 2014-01-28 NOTE — Progress Notes (Signed)
   Subjective:    Patient ID: Meghan Oliver, female    DOB: Sep 05, 1941, 73 y.o.   MRN: [id number]  HPI Approximate 10 day history of URI symptoms. Has had cough with some sputum production. Flew on airplane this past weekend. Noticed some ear discomfort during the flight. Her dog now travels with her as a service animal due to psychological issues. No documented fever or shaking chills. She is coughing in the office today. No sore throat.    Review of Systems     Objective:   Physical Exam Right TM is full but not red. Left TM clear. Pharynx is clear. Neck is supple without adenopathy. Chest clear to auscultation without rales or wheezing.       Assessment & Plan:  Acute bronchitis  Right serous otitis media  Plan: Amoxicillin 500 mg 3 times a day for 10 days. Tessalon Perles 200 mg 3 times a day when necessary cough. Call if not better in 7-10 days.

## 2014-01-28 NOTE — Patient Instructions (Signed)
Take amoxicillin 500 mg 3 times daily for 10 days. Take Tessalon Perles as directed for cough. Call if not better in 7-10 days.

## 2014-01-31 ENCOUNTER — Encounter: Payer: Self-pay | Admitting: Internal Medicine

## 2014-01-31 ENCOUNTER — Encounter: Payer: Self-pay | Admitting: Neurology

## 2014-02-01 ENCOUNTER — Other Ambulatory Visit: Payer: Self-pay

## 2014-02-01 MED ORDER — PRIMIDONE 50 MG PO TABS: 50.0000 mg | ORAL_TABLET | Freq: Every day | ORAL | Status: DC

## 2014-02-01 MED ORDER — CELECOXIB 200 MG PO CAPS: 200.0000 mg | ORAL_CAPSULE | Freq: Every day | ORAL | Status: DC

## 2014-02-01 MED ORDER — OMEPRAZOLE 40 MG PO CPDR: 40.0000 mg | DELAYED_RELEASE_CAPSULE | Freq: Two times a day (BID) | ORAL | Status: DC

## 2014-02-01 NOTE — Telephone Encounter (Signed)
Dr Olevia Perches- Patient requests refills on omeprazole. However, this has always been prescribed by Dr Gwenette Greet. May I refill?

## 2014-02-01 NOTE — Telephone Encounter (Signed)
Rx sent 

## 2014-02-04 NOTE — Telephone Encounter (Signed)
Patient returning call.  Informed her that PA was approved.  Nothing else needed.

## 2014-02-12 ENCOUNTER — Telehealth: Payer: Self-pay | Admitting: Internal Medicine

## 2014-02-12 ENCOUNTER — Other Ambulatory Visit: Payer: Self-pay

## 2014-02-12 MED ORDER — GUAIFENESIN-CODEINE 100-10 MG/5ML PO SYRP: 5.0000 mL | ORAL_SOLUTION | Freq: Three times a day (TID) | ORAL | Status: DC | PRN

## 2014-02-12 MED ORDER — AZITHROMYCIN 250 MG PO TABS: ORAL_TABLET | ORAL | Status: DC

## 2014-02-12 NOTE — Telephone Encounter (Signed)
Refill Z pak 

## 2014-02-12 NOTE — Telephone Encounter (Signed)
Patient finished Amoxicillin. Per written order of Dr. Renold Genta, will rx a Z Pak as directed, and Cheratussin AC as patient requested to [facility]

## 2014-02-18 ENCOUNTER — Ambulatory Visit (INDEPENDENT_AMBULATORY_CARE_PROVIDER_SITE_OTHER): Payer: Medicare Other | Admitting: Neurology

## 2014-02-18 ENCOUNTER — Encounter: Payer: Self-pay | Admitting: Neurology

## 2014-02-18 VITALS — BP 122/58 | HR 60 | Resp 16

## 2014-02-18 DIAGNOSIS — G9340 Encephalopathy, unspecified: Secondary | ICD-10-CM

## 2014-02-18 DIAGNOSIS — G250 Essential tremor: Secondary | ICD-10-CM

## 2014-02-18 DIAGNOSIS — F319 Bipolar disorder, unspecified: Secondary | ICD-10-CM | POA: Diagnosis not present

## 2014-02-18 DIAGNOSIS — F1010 Alcohol abuse, uncomplicated: Secondary | ICD-10-CM

## 2014-02-18 DIAGNOSIS — G252 Other specified forms of tremor: Secondary | ICD-10-CM

## 2014-02-18 DIAGNOSIS — R413 Other amnesia: Secondary | ICD-10-CM

## 2014-02-18 DIAGNOSIS — G251 Drug-induced tremor: Secondary | ICD-10-CM

## 2014-02-18 LAB — CBC WITH DIFFERENTIAL/PLATELET
Basophils Absolute: 0.1 10*3/uL (ref 0.0–0.1)
Basophils Relative: 1 % (ref 0–1)
Eosinophils Absolute: 0.3 10*3/uL (ref 0.0–0.7)
Eosinophils Relative: 5 % (ref 0–5)
HCT: 35.5 % — ABNORMAL LOW (ref 36.0–46.0)
HEMOGLOBIN: 11.7 g/dL — AB (ref 12.0–15.0)
LYMPHS PCT: 26 % (ref 12–46)
Lymphs Abs: 1.7 10*3/uL (ref 0.7–4.0)
MCH: 32.1 pg (ref 26.0–34.0)
MCHC: 33 g/dL (ref 30.0–36.0)
MCV: 97.3 fL (ref 78.0–100.0)
MONOS PCT: 10 % (ref 3–12)
Monocytes Absolute: 0.7 10*3/uL (ref 0.1–1.0)
NEUTROS ABS: 3.8 10*3/uL (ref 1.7–7.7)
Neutrophils Relative %: 58 % (ref 43–77)
Platelets: 254 10*3/uL (ref 150–400)
RBC: 3.65 MIL/uL — ABNORMAL LOW (ref 3.87–5.11)
RDW: 13.4 % (ref 11.5–15.5)
WBC: 6.5 10*3/uL (ref 4.0–10.5)

## 2014-02-18 MED ORDER — PRIMIDONE 50 MG PO TABS: 50.0000 mg | ORAL_TABLET | Freq: Every day | ORAL | Status: DC

## 2014-02-18 NOTE — Patient Instructions (Signed)
1. Your provider has requested that you have labwork completed today. Please go to [facility] on the first floor of this building before leaving the office today.  

## 2014-02-18 NOTE — Progress Notes (Signed)
Meghan Oliver was seen today in the movement disorders clinic for neurologic consultation at the request of [name], MD.  The consultation is for the evaluation of tremor.  She is accompanied by her husband who supplements the history.  She supplied numerous records I had the opportunity to review.  She was a previous patient of Dr. love.  It appears that she last saw him about one year ago, in November, 2013.  The pt reports tremor x 2 years but her husband states at least 3-4 years.  It is in both hands, R worse than L.  She is R hand dominant.  It bothers her most with drinking coffee.  She has to drink out of a straw.  She cannot eat soup.  She does not drink caffeine.  Alcohol does improve tremor.  She has trouble writing because of the tremor.    The patient does have a very long history of depression.  She is currently on lithium and Zyprexa, which she has been on for 5 years and she states that both of these help.  She is status post ECT treatments began in January, 2000 and continued for 50 treatments of December, 2010.  In the past, the patient has been on Topamax for tremor, Inderal, 10 mg twice a day (this caused decrease in heart rate) and is currently on clonazepam.  She does not even remember being on the Topamax, so cannot remember if she had side effects or if it just did not work.  02/18/14 update:  Patient is seen back in followup today, accompanied by her husband as well as her nurse [name]), both of whom supplement the history.  Tremor is better than it was 6 weeks ago.  Had unsteadiness while in [place] and had to use a cane.  Has to have a railing to go up and down stairs.  Saw a neurologist in [place] last November as was becoming very sedated.  It was recommended that she decrease the lithium and she did.  Thinks that is why tremor is improved but admits that it tends to wax and wane.  They bring up a different concern.  Her husband recently discovered that the patient drinks 8-12 oz of  vodka per day.  Apparently, her nurse has been aware of this for years.  Pt saw her psychiatrist, [name], recently but didn't talk to him about this but now would like me to contact him about this.  She would like to see him before she goes on a big family trip to [place] on June 3.     PREVIOUS MEDICATIONS: Inderal (bradycardia), Topamax, clonazepam  ALLERGIES:   Allergies  Allergen Reactions  . Propranolol     Low blood pressure    CURRENT MEDICATIONS:  Current Outpatient Prescriptions on File Prior to Visit  Medication Sig Dispense Refill  . amLODipine (NORVASC) 10 MG tablet Take 1 tablet (10 mg total) by mouth daily.  90 tablet  3  . aspirin 81 MG tablet Take 81 mg by mouth daily.        . Calcium Carbonate-Vitamin D (CALCIUM 600/VITAMIN D PO) Take 1 tablet by mouth 2 (two) times daily.       . celecoxib (CELEBREX) 200 MG capsule Take 1 capsule (200 mg total) by mouth daily.  90 capsule  3  . cetirizine (ZYRTEC) 10 MG tablet Take 10 mg by mouth at bedtime.      . chlorthalidone (HYGROTON) 25 MG tablet Take 1 tablet (  25 mg total) by mouth daily.  90 tablet  0  . Cholecalciferol (VITAMIN D3) 2000 UNITS TABS Take 2 tablets by mouth daily.       . clonazePAM (KLONOPIN) 0.5 MG tablet Take 0.25 mg by mouth as needed.       . docusate-casanthranol (PERICOLACE) 100-30 MG per capsule daily as needed.        . fesoterodine (TOVIAZ) 8 MG TB24 tablet Take 1 tablet (8 mg total) by mouth daily.  90 tablet  1  . FLUoxetine (PROZAC) 40 MG capsule Take 40 mg by mouth daily.        Marland Kitchen levothyroxine (SYNTHROID, LEVOTHROID) 75 MCG tablet Take 1 tablet (75 mcg total) by mouth daily.  90 tablet  1  . lithium 150 MG capsule Take 2 tablets every evening      . losartan (COZAAR) 100 MG tablet Take 1 tablet (100 mg total) by mouth daily.  90 tablet  0  . Multiple Vitamins-Minerals (MULTIVITAMIN WITH MINERALS) tablet Take 1 tablet by mouth daily.        Marland Kitchen OLANZapine (ZYPREXA) 5 MG tablet Take 1/2 tablet  daily      . Omega-3 Fatty Acids (FISH OIL) 1200 MG CAPS Take 1 capsule by mouth daily.        . potassium chloride SA (KLOR-CON M20) 20 MEQ tablet Take 1 tablet (20 mEq total) by mouth daily.  90 tablet  0  . primidone (MYSOLINE) 50 MG tablet Take 1 tablet (50 mg total) by mouth at bedtime.  30 tablet  5  . risedronate (ACTONEL) 150 MG tablet Take 150 mg by mouth every 30 (thirty) days. with water on empty stomach, nothing by mouth or lie down for next 30 minutes.      . rosuvastatin (CRESTOR) 20 MG tablet Take 1 tablet (20 mg total) by mouth daily.  90 tablet  0  . traMADol (ULTRAM) 50 MG tablet Take 50 mg by mouth 2 (two) times daily as needed. For back pain      . valACYclovir (VALTREX) 500 MG tablet Take 500 mg by mouth as needed.       . vitamin B-12 (CYANOCOBALAMIN) 1000 MCG tablet Take 1,000 mcg by mouth daily.        . Wheat Dextrin (BENEFIBER) POWD Take 1 heaping teaspoon every morning  1 g  0  . zolpidem (AMBIEN) 5 MG tablet Take 1 tablet (5 mg total) by mouth at bedtime as needed for sleep.  30 tablet  0  . [DISCONTINUED] fesoterodine (TOVIAZ) 8 MG TB24 Take 1 tablet (8 mg total) by mouth daily.  90 tablet  3   No current facility-administered medications on file prior to visit.    PAST MEDICAL HISTORY:   Past Medical History  Diagnosis Date  . GERD (gastroesophageal reflux disease)   . Hyperlipidemia   . Anxiety   . Low back pain   . Osteoporosis   . Carotid disease, bilateral     carotid dopplers 12/10 with 40-59% bilateral ICA stenosis  . Bipolar disease, chronic   . Hypertension   . Depression     s/p ECT  . Migraines   . Heart murmur   . Carotid bruit     PAST SURGICAL HISTORY:   Past Surgical History  Procedure Laterality Date  . Esophagogastroduodenoscopy  02-26-04  . Appendectomy  1971  . Tubal ligation    . Augmentation mammaplasty    . Abdominal surgery  Laparotomy Ovarian cystctomy    SOCIAL HISTORY:   History   Social History  . Marital  Status: Married    Spouse Name: Dr. Lyla Son    Number of Children: 2  . Years of Education: N/A   Occupational History  . housewife    Social History Main Topics  . Smoking status: Former Smoker -- 2.00 packs/day for 15 years    Types: Cigarettes    Quit date: 01/13/1971  . Smokeless tobacco: Never Used  . Alcohol Use: 2.0 oz/week    4 drink(s) per week     Comment: wine, three times per week  . Drug Use: No  . Sexual Activity: No   Other Topics Concern  . Not on file   Social History Narrative  . No narrative on file    FAMILY HISTORY:   Family Status  Relation Status Death Age  . Father Deceased     CAD  . Mother Deceased     COPD  . Brother Alive     skin CA (? type)  . Child Alive     2, healthy    ROS:  Admits to memory changes post ECT.  Has balance changes; several falls this past year.  A complete 10 system review of systems was obtained and was unremarkable apart from what is mentioned above.  PHYSICAL EXAMINATION:    VITALS:   Filed Vitals:   02/18/14 1400  BP: 122/58  Pulse: 60  Resp: 16    GEN:  The patient appears younger than stated age and is in NAD. HEENT:  Normocephalic, atraumatic.  The mucous membranes are moist. The superficial temporal arteries are without ropiness or tenderness. CV:  RRR Lungs:  CTAB Neck/HEME:  There are no carotid bruits bilaterally.  Neurological examination:  Orientation: The patient is alert and oriented x3.  However, her nurse has to provide nearly all of the history and then she is very repetitve today.   Cranial nerves: There is good facial symmetry. Pupils are equal round and reactive to light bilaterally. Fundoscopic exam reveals clear margins bilaterally. Extraocular muscles are intact. The visual fields are full to confrontational testing. The speech is fluent and clear. Soft palate rises symmetrically and there is no tongue deviation. Hearing is intact to conversational tone. Sensation: Sensation is  intact to light touch throughout. Motor: Strength is 5/5 in the bilateral upper and lower extremities.   Shoulder shrug is equal and symmetric.  There is no pronator drift.   Movement examination: Tone: There is normal tone bilaterally Abnormal movements: There is an irregular tremor of the outstretched hands that increases with intention Coordination:  There is no significant decremation with RAM's, including alternating supination and pronation of the forearm, hand opening and closing, finger taps, heel taps and toe taps. Gait and Station: The patient has no difficulty arising out of a deep-seated chair without the use of the hands. The patient's stride length is normal.  She cannot ambulate in a tandem fashion.  She is able to stand in the Romberg position.  ASSESSMENT/PLAN:  1.  Tremor.  This is likely primarily due to lithium, although Zyprexa may contribute.  EtOH and alcohol w/d may play a role as well.  This may be part of the reason they see the waxing and waning quality.  I had a long discussion with the patient and her husband and nurse today.  We talked about the toxic role that alcohol can play to the nervous system, including  the peripheral and central nervous system.  We talked about the fact that it can cause cerebellar atrophy as well as peripheral neuropathy.  We talked about the role that this could be playing in her balance difficulties.  We talked about the detrimental effects, especially given her medications.  -I am going to go ahead and do an MRI of the brain.  -We talked about the importance of not discontinuing the alcohol cold [place], or it could cause an alcohol withdrawal seizure.  She would need to do this slowly, under the guidance of a medical professional.  -She asked me to call her psychiatrist, [name], which I did and updated him as requested by the patient.  He was going to call her and get her an appointment.  I did tell him that the patient was willing to see  an alcohol counselor, if under his direction.  -Labs will be drawn today, including CBC, TSH, lithium and CMP.  At the request of the patient and her nurse, I did an alcohol level.  -I. did not increase her Mysoline today, as I think she has tremor for other reasons.  This is an option in the future, but I think that we need to get the alcohol further addressed before medication is increased.    -greater than 50% of 40 min visit in counseling and coordinating care, as above. 2.  Memory changes.  I suspect that this is likely from multiple ECT treatments.  Alcohol could certainly be playing a role here as well. 3. Follow up in 6 months, sooner if needed.

## 2014-02-19 ENCOUNTER — Telehealth: Payer: Self-pay | Admitting: Neurology

## 2014-02-19 DIAGNOSIS — M25569 Pain in unspecified knee: Secondary | ICD-10-CM | POA: Diagnosis not present

## 2014-02-19 DIAGNOSIS — M76899 Other specified enthesopathies of unspecified lower limb, excluding foot: Secondary | ICD-10-CM | POA: Diagnosis not present

## 2014-02-19 DIAGNOSIS — M25559 Pain in unspecified hip: Secondary | ICD-10-CM | POA: Diagnosis not present

## 2014-02-19 LAB — COMPREHENSIVE METABOLIC PANEL
ALBUMIN: 4 g/dL (ref 3.5–5.2)
ALT: 50 U/L — ABNORMAL HIGH (ref 0–35)
AST: 41 U/L — ABNORMAL HIGH (ref 0–37)
Alkaline Phosphatase: 52 U/L (ref 39–117)
BUN: 25 mg/dL — ABNORMAL HIGH (ref 6–23)
CALCIUM: 9 mg/dL (ref 8.4–10.5)
CO2: 24 mEq/L (ref 19–32)
Chloride: 105 mEq/L (ref 96–112)
Creat: 0.87 mg/dL (ref 0.50–1.10)
GLUCOSE: 95 mg/dL (ref 70–99)
POTASSIUM: 3.9 meq/L (ref 3.5–5.3)
Sodium: 136 mEq/L (ref 135–145)
Total Bilirubin: 0.4 mg/dL (ref 0.2–1.2)
Total Protein: 6.2 g/dL (ref 6.0–8.3)

## 2014-02-19 LAB — LITHIUM LEVEL: LITHIUM LVL: 0.6 meq/L — AB (ref 0.80–1.40)

## 2014-02-19 LAB — ETHANOL: Alcohol, Ethyl (B): <10 mg/dL (ref 0–10)

## 2014-02-19 LAB — TSH: TSH: 1.144 u[IU]/mL (ref 0.350–4.500)

## 2014-02-19 NOTE — Telephone Encounter (Signed)
Lab results faxed to Dr Clovis Pu per patient's request at [phone] with confirmation received.

## 2014-02-21 ENCOUNTER — Other Ambulatory Visit: Payer: Medicare Other | Admitting: Internal Medicine

## 2014-02-21 ENCOUNTER — Ambulatory Visit
Admission: RE | Admit: 2014-02-21 | Discharge: 2014-02-21 | Disposition: A | Discharge status: Home / Self Care | Payer: Medicare Other | Source: Ambulatory Visit | Attending: Internal Medicine | Admitting: Internal Medicine

## 2014-02-21 DIAGNOSIS — F3162 Bipolar disorder, current episode mixed, moderate: Secondary | ICD-10-CM

## 2014-02-21 DIAGNOSIS — J40 Bronchitis, not specified as acute or chronic: Secondary | ICD-10-CM | POA: Diagnosis not present

## 2014-02-21 DIAGNOSIS — I10 Essential (primary) hypertension: Secondary | ICD-10-CM | POA: Diagnosis not present

## 2014-02-21 DIAGNOSIS — E039 Hypothyroidism, unspecified: Secondary | ICD-10-CM | POA: Diagnosis not present

## 2014-02-21 DIAGNOSIS — R05 Cough: Secondary | ICD-10-CM

## 2014-02-21 DIAGNOSIS — Z130 Encounter for screening for diseases of the blood and blood-forming organs and certain disorders involving the immune mechanism: Secondary | ICD-10-CM

## 2014-02-21 DIAGNOSIS — E785 Hyperlipidemia, unspecified: Secondary | ICD-10-CM | POA: Diagnosis not present

## 2014-02-21 DIAGNOSIS — R059 Cough, unspecified: Secondary | ICD-10-CM

## 2014-02-21 LAB — COMPREHENSIVE METABOLIC PANEL
ALBUMIN: 4 g/dL (ref 3.5–5.2)
ALK PHOS: 42 U/L (ref 39–117)
ALT: 45 U/L — AB (ref 0–35)
AST: 31 U/L (ref 0–37)
BUN: 19 mg/dL (ref 6–23)
CO2: 25 mEq/L (ref 19–32)
Calcium: 9.8 mg/dL (ref 8.4–10.5)
Chloride: 103 mEq/L (ref 96–112)
Creat: 0.81 mg/dL (ref 0.50–1.10)
GLUCOSE: 99 mg/dL (ref 70–99)
POTASSIUM: 4 meq/L (ref 3.5–5.3)
SODIUM: 138 meq/L (ref 135–145)
TOTAL PROTEIN: 6.6 g/dL (ref 6.0–8.3)
Total Bilirubin: 0.3 mg/dL (ref 0.2–1.2)

## 2014-02-21 LAB — CBC WITH DIFFERENTIAL/PLATELET
Basophils Absolute: 0 10*3/uL (ref 0.0–0.1)
Basophils Relative: 0 % (ref 0–1)
Eosinophils Absolute: 0.1 10*3/uL (ref 0.0–0.7)
Eosinophils Relative: 1 % (ref 0–5)
HCT: 35.4 % — ABNORMAL LOW (ref 36.0–46.0)
HEMOGLOBIN: 11.8 g/dL — AB (ref 12.0–15.0)
Lymphocytes Relative: 19 % (ref 12–46)
Lymphs Abs: 2 10*3/uL (ref 0.7–4.0)
MCH: 32 pg (ref 26.0–34.0)
MCHC: 33.3 g/dL (ref 30.0–36.0)
MCV: 95.9 fL (ref 78.0–100.0)
MONOS PCT: 9 % (ref 3–12)
Monocytes Absolute: 0.9 10*3/uL (ref 0.1–1.0)
NEUTROS ABS: 7.3 10*3/uL (ref 1.7–7.7)
NEUTROS PCT: 71 % (ref 43–77)
Platelets: 281 10*3/uL (ref 150–400)
RBC: 3.69 MIL/uL — ABNORMAL LOW (ref 3.87–5.11)
RDW: 13.4 % (ref 11.5–15.5)
WBC: 10.3 10*3/uL (ref 4.0–10.5)

## 2014-02-21 LAB — LIPID PANEL
Cholesterol: 201 mg/dL — ABNORMAL HIGH (ref 0–200)
HDL: 126 mg/dL (ref >39)
LDL Cholesterol: 62 mg/dL (ref 0–99)
TRIGLYCERIDES: 65 mg/dL (ref <150)
Total CHOL/HDL Ratio: 1.6 Ratio
VLDL: 13 mg/dL (ref 0–40)

## 2014-02-21 MED ORDER — MOXIFLOXACIN HCL 400 MG PO TABS: 400.0000 mg | ORAL_TABLET | Freq: Every day | ORAL | Status: DC

## 2014-02-21 NOTE — Telephone Encounter (Signed)
Chest x-ray consistent with patchy left lower lobe pneumonia. Patient informed. Start Avelox 400 mg daily for 10 days. She has appointment here on Tuesday, May 26 for physical exam and we will followup then. She's going to [place] in 2 weeks.

## 2014-02-21 NOTE — Progress Notes (Signed)
Patient reports she is still sick with bronchitis symptoms. States she thinks she needs an X-ray . OK per verbal order of Dr. Renold Genta for a chest x-Ray. Patient informed.

## 2014-02-22 DIAGNOSIS — F332 Major depressive disorder, recurrent severe without psychotic features: Secondary | ICD-10-CM | POA: Diagnosis not present

## 2014-02-22 LAB — TSH: TSH: 0.562 u[IU]/mL (ref 0.350–4.500)

## 2014-02-22 LAB — LITHIUM LEVEL: LITHIUM LVL: 0.5 meq/L — AB (ref 0.80–1.40)

## 2014-02-26 ENCOUNTER — Encounter: Payer: Self-pay | Admitting: Internal Medicine

## 2014-02-26 ENCOUNTER — Ambulatory Visit (INDEPENDENT_AMBULATORY_CARE_PROVIDER_SITE_OTHER): Payer: Medicare Other | Admitting: Internal Medicine

## 2014-02-26 VITALS — BP 120/52 | HR 72 | Temp 99.5°F | Ht 59.0 in | Wt 130.0 lb

## 2014-02-26 DIAGNOSIS — J189 Pneumonia, unspecified organism: Secondary | ICD-10-CM

## 2014-02-26 DIAGNOSIS — Z0000 Encounter for general adult medical examination without abnormal findings: Secondary | ICD-10-CM

## 2014-02-26 DIAGNOSIS — K5909 Other constipation: Secondary | ICD-10-CM

## 2014-02-26 DIAGNOSIS — K5900 Constipation, unspecified: Secondary | ICD-10-CM

## 2014-02-26 DIAGNOSIS — K219 Gastro-esophageal reflux disease without esophagitis: Secondary | ICD-10-CM

## 2014-02-26 DIAGNOSIS — E039 Hypothyroidism, unspecified: Secondary | ICD-10-CM

## 2014-02-26 DIAGNOSIS — G4733 Obstructive sleep apnea (adult) (pediatric): Secondary | ICD-10-CM

## 2014-02-26 DIAGNOSIS — R011 Cardiac murmur, unspecified: Secondary | ICD-10-CM

## 2014-02-26 DIAGNOSIS — F1011 Alcohol abuse, in remission: Secondary | ICD-10-CM

## 2014-02-26 DIAGNOSIS — M810 Age-related osteoporosis without current pathological fracture: Secondary | ICD-10-CM

## 2014-02-26 DIAGNOSIS — J181 Lobar pneumonia, unspecified organism: Secondary | ICD-10-CM

## 2014-02-26 LAB — POCT URINALYSIS DIPSTICK
Bilirubin, UA: NEGATIVE
Blood, UA: NEGATIVE
Glucose, UA: NEGATIVE
Ketones, UA: NEGATIVE
Leukocytes, UA: NEGATIVE
NITRITE UA: NEGATIVE
PROTEIN UA: NEGATIVE
Spec Grav, UA: >=1.03
Urobilinogen, UA: NEGATIVE
pH, UA: 5.5

## 2014-02-27 ENCOUNTER — Encounter: Payer: Self-pay | Admitting: Internal Medicine

## 2014-02-27 NOTE — Patient Instructions (Signed)
Continue same medications and return in 6 months. Have repeat chest x-ray June for

## 2014-02-27 NOTE — Progress Notes (Signed)
Subjective:    Patient ID: Meghan Oliver, female    DOB: 12/26/1940, 73 y.o.   MRN: [id number]  HPI 73 year old White female in today for health maintenance exam and evaluation of medical issues. She has long-standing history of depression starting in her 74s after the birth of her first child. History of bipolar disorder. History of hypertension. History of lumbar disc disease. Past history of hematuria which was worked up by Dr. Elyse Jarvis in the past. History of transmitted murmur in her carotids. Had a Doppler study done December 2010 showed a 40-59% stenosis in both internal carotid arteries. History of osteoporosis. Is on calcium and vitamin D supplements but sometimes forgets to take them. Colonoscopy was done in 2006. History of bilateral breast implants. History of  hyperlipidemia, osteoporosis, GE reflux, hypothyroidism, low back pain, obstructive sleep apnea.  History of chronic constipation. Has had one episode of urge incontinence recently. She became menopausal at age 73. Does not smoke. Recently has been drinking 8 ounces of vodka a night. Her husband discovered this and took the vodka out of the house. She says that she's been doing okay since then and has had 1 wine spritzer.  Social history: Married to a retired Copywriter, advertising. 2 adult daughters. One daughter resides in [place] and is married to a physician. Patient and her husband usually spend December through February in [place] every winter.  Family history: Father died at age 60 of an MI. Mother died with history of COPD. One brother in good health.  Sees [name] for psychiatric treatment. She is on lithium.  Patient developed a cough while in [place] this past winter and received a Z-Pak. She was seen here April 27 with a 10 day history of URI symptoms. Was diagnosed with acute bronchitis and right serous otitis media. She was treated with amoxicillin. She called last week saying she was still coughing. A chest x-ray was  ordered indicating patchy area of infiltrate left lower lobe. She was started on Avelox 400 mg daily for 10 days and is to have repeat chest x-ray Monday, June 1. She is leaving to go on a 12 day cruise to [place] on June 4.    Review of Systems  Constitutional: Positive for fatigue.  HENT: Negative.   Eyes: Negative.   Respiratory: Positive for cough.        History of obstructive sleep apnea  Cardiovascular: Negative.   Gastrointestinal:       Chronic constipation. GERD.  Endocrine:       History of hypothyroidism on thyroid replacement. History of osteoporosis.  Genitourinary:       One episode recently of urge urinary incontinence but urinalysis today is normal  Neurological:       History of low back pain. History of tremor.  Hematological: Negative.   Psychiatric/Behavioral:       History of bipolar disorder.       Objective:   Physical Exam  Vitals reviewed. Constitutional: She is oriented to person, place, and time. She appears well-developed and well-nourished. No distress.  HENT:  Head: Normocephalic and atraumatic.  Right Ear: External ear normal.  Mouth/Throat: Oropharynx is clear and moist. No oropharyngeal exudate.  Eyes: Conjunctivae and EOM are normal. Pupils are equal, round, and reactive to light. Right eye exhibits no discharge. Left eye exhibits no discharge. No scleral icterus.  Neck: Neck supple. No JVD present. No thyromegaly present.  Cardiovascular: Normal rate, regular rhythm and intact distal pulses.   Murmur heard. 2/6  systolic ejection murmur upper right sternal border  Pulmonary/Chest: Effort normal and breath sounds normal. No respiratory distress. She has no wheezes. She exhibits no tenderness.  Bilateral breast implants. No masses.  Coarse breath sounds left lower lobe. No egophony. No frank rales  Abdominal: Soft. Bowel sounds are normal. She exhibits no distension and no mass. There is no tenderness. There is no rebound and no guarding.    Genitourinary:  Deferred to GYN  Musculoskeletal: She exhibits no edema.  Lymphadenopathy:    She has no cervical adenopathy.  Neurological: She is alert and oriented to person, place, and time. She has normal reflexes. No cranial nerve deficit. Coordination normal.  Fine resting tremor bilaterally  Skin: She is not diaphoretic.  Psychiatric: She has a normal mood and affect. Her behavior is normal. Judgment and thought content normal.          Assessment & Plan:   Hypertension-blood pressure stable on current regimen and followed by cardiology  Hyperlipidemia-stable on Crestor  History of low back pain  Osteoporosis  Left lower lobe pneumonia  Hypothyroidism  Obstructive sleep apnea  Resting tremor bilaterally  GE reflux  Chronic constipation  History of systolic cardiac murmur  Plan: Followup chest x-ray Monday, June 1. Would like for her to have Avelox to take a trip with her to [place]. Continue same medications for now. Complete ten-day course of Avelox for pneumonia. Return in 6 months.         Subjective:   Patient presents for Medicare Annual/Subsequent preventive examination.  Review Past Medical/Family/Social: see above   Risk Factors  Current exercise habits: walking Dietary issues discussed: low fat low carb  Cardiac risk factors:  Depression Screen  (Note: if answer to either of the following is "Yes", a more complete depression screening is indicated)   Over the past two weeks, have you felt down, depressed or hopeless? No  Over the past two weeks, have you felt little interest or pleasure in doing things? yes Have you lost interest or pleasure in daily life? No Do you often feel hopeless? No Do you cry easily over simple problems? No   Activities of Daily Living  In your present state of health, do you have any difficulty performing the following activities?:   Driving? No  Managing money? No  Feeding yourself? No  Getting  from bed to chair? No  Climbing a flight of stairs? No  Preparing food and eating?: No  Bathing or showering? No  Getting dressed: No  Getting to the toilet? No  Using the toilet:No  Moving around from place to place: No  In the past year have you fallen or had a near fall?:No  Are you sexually active? No  Do you have more than one partner? No   Hearing Difficulties: No  Do you often ask people to speak up or repeat themselves? No  Do you experience ringing or noises in your ears? No  Do you have difficulty understanding soft or whispered voices? No  Do you feel that you have a problem with memory? sometimes Do you often misplace items? No    Home Safety:  Do you have a smoke alarm at your residence? Yes Do you have grab bars in the bathroom? no Do you have throw rugs in your house?yes   Cognitive Testing  Alert? Yes Normal Appearance?Yes  Oriented to person? Yes Place? Yes  Time? Yes  Recall of three objects? Yes  Can perform simple calculations? Yes  Displays appropriate judgment?Yes  Can read the correct time from a watch face?Yes   List the Names of Other Physician/Practitioners you currently use:  See referral list for the physicians patient is currently seeing.  [name], Psychiatrist  [facility]  Dr. Olevia Perches, gastroenterology  Dr. Carles Collet, Neurologist   Review of Systems: See Epic above   Objective:     General appearance: Appears stated age  Head: Normocephalic, without obvious abnormality, atraumatic  Eyes: conj clear, EOMi PEERLA  Ears: normal TM's and external ear canals both ears  Nose: Nares normal. Septum midline. Mucosa normal. No drainage or sinus tenderness.  Throat: lips, mucosa, and tongue normal; teeth and gums normal  Neck: no adenopathy, no carotid bruit, no JVD, supple, symmetrical, trachea midline and thyroid not enlarged, symmetric, no tenderness/mass/nodules  No CVA tenderness.  Lungs: clear to auscultation bilaterally   Breasts: normal appearance, no masses or tenderness,  Heart: regular rate and rhythm, S1, S2 normal,  click, rub or gallop , 2/6 systolic ejection murmur Abdomen: soft, non-tender; bowel sounds normal; no masses, no organomegaly  Musculoskeletal: ROM normal in all joints, no crepitus, no deformity, Normal muscle strengthen. Back  is symmetric, no curvature. Skin: Skin color, texture, turgor normal. No rashes or lesions  Lymph nodes: Cervical, supraclavicular, and axillary nodes normal.  Neurologic: CN 2 -12 Normal, Normal symmetric reflexes. Normal coordination and gait  Psych: Alert & Oriented x 3, Mood appear stable.    Assessment:    Annual wellness medicare exam   Plan:    During the course of the visit the patient was educated and counseled about appropriate screening and preventive services including:  Annual mammogram  Colonoscopy up-to-date      Patient Instructions (the written plan) was given to the patient.  Medicare Attestation  I have personally reviewed:  The patient's medical and social history  Their use of alcohol, tobacco or illicit drugs  Their current medications and supplements  The patient's functional ability including ADLs,fall risks, home safety risks, cognitive, and hearing and visual impairment  Diet and physical activities  Evidence for depression or mood disorders  The patient's weight, height, BMI, and visual acuity have been recorded in the chart. I have made referrals, counseling, and provided education to the patient based on review of the above and I have provided the patient with a written personalized care plan for preventive services.

## 2014-03-04 ENCOUNTER — Ambulatory Visit
Admission: RE | Admit: 2014-03-04 | Discharge: 2014-03-04 | Disposition: A | Discharge status: Home / Self Care | Payer: Medicare Other | Source: Ambulatory Visit | Attending: Internal Medicine | Admitting: Internal Medicine

## 2014-03-04 DIAGNOSIS — J189 Pneumonia, unspecified organism: Secondary | ICD-10-CM

## 2014-03-04 DIAGNOSIS — F332 Major depressive disorder, recurrent severe without psychotic features: Secondary | ICD-10-CM | POA: Diagnosis not present

## 2014-03-22 DIAGNOSIS — F332 Major depressive disorder, recurrent severe without psychotic features: Secondary | ICD-10-CM | POA: Diagnosis not present

## 2014-03-27 DIAGNOSIS — F332 Major depressive disorder, recurrent severe without psychotic features: Secondary | ICD-10-CM | POA: Diagnosis not present

## 2014-03-28 ENCOUNTER — Other Ambulatory Visit: Payer: Self-pay

## 2014-03-28 MED ORDER — CHLORTHALIDONE 25 MG PO TABS: 25.0000 mg | ORAL_TABLET | Freq: Every day | ORAL | Status: DC

## 2014-03-28 MED ORDER — LOSARTAN POTASSIUM 100 MG PO TABS: 100.0000 mg | ORAL_TABLET | Freq: Every day | ORAL | Status: DC

## 2014-04-03 DIAGNOSIS — F332 Major depressive disorder, recurrent severe without psychotic features: Secondary | ICD-10-CM | POA: Diagnosis not present

## 2014-04-08 ENCOUNTER — Encounter: Payer: Self-pay | Admitting: Neurology

## 2014-04-08 ENCOUNTER — Ambulatory Visit (INDEPENDENT_AMBULATORY_CARE_PROVIDER_SITE_OTHER): Payer: Medicare Other | Admitting: Neurology

## 2014-04-08 VITALS — BP 130/70 | HR 60 | Resp 14

## 2014-04-08 DIAGNOSIS — R259 Unspecified abnormal involuntary movements: Secondary | ICD-10-CM | POA: Diagnosis not present

## 2014-04-08 DIAGNOSIS — F319 Bipolar disorder, unspecified: Secondary | ICD-10-CM | POA: Diagnosis not present

## 2014-04-08 DIAGNOSIS — R251 Tremor, unspecified: Secondary | ICD-10-CM

## 2014-04-08 MED ORDER — PRIMIDONE 50 MG PO TABS
50.0000 mg | ORAL_TABLET | Freq: Two times a day (BID) | ORAL | Status: DC
Start: 2014-04-08 — End: 2014-04-29

## 2014-04-08 NOTE — Progress Notes (Signed)
Meghan Oliver was seen today in the movement disorders clinic for neurologic consultation at the request of [name], MD.  The consultation is for the evaluation of tremor.  She is accompanied by her husband who supplements the history.  She supplied numerous records I had the opportunity to review.  She was a previous patient of Dr. love.  It appears that she last saw him about one year ago, in November, 2013.  The pt reports tremor x 2 years but her husband states at least 3-4 years.  It is in both hands, R worse than L.  She is R hand dominant.  It bothers her most with drinking coffee.  She has to drink out of a straw.  She cannot eat soup.  She does not drink caffeine.  Alcohol does improve tremor.  She has trouble writing because of the tremor.    The patient does have a very long history of depression.  She is currently on lithium and Zyprexa, which she has been on for 5 years and she states that both of these help.  She is status post ECT treatments began in January, 2000 and continued for 50 treatments of December, 2010.  In the past, the patient has been on Topamax for tremor, Inderal, 10 mg twice a day (this caused decrease in heart rate) and is currently on clonazepam.  She does not even remember being on the Topamax, so cannot remember if she had side effects or if it just did not work.  02/18/14 update:  Patient is seen back in followup today, accompanied by her husband as well as her nurse [name]), both of whom supplement the history.  Tremor is better than it was 6 weeks ago.  Had unsteadiness while in [place] and had to use a cane.  Has to have a railing to go up and down stairs.  Saw a neurologist in [place] last November as was becoming very sedated.  It was recommended that she decrease the lithium and she did.  Thinks that is why tremor is improved but admits that it tends to wax and wane.  They bring up a different concern.  Her husband recently discovered that the patient drinks 8-12 oz of  vodka per day.  Apparently, her nurse has been aware of this for years.  Pt saw her psychiatrist, [name], recently but didn't talk to him about this but now would like me to contact him about this.  She would like to see him before she goes on a big family trip to [place] on June 3.    04/08/14 update:  Pt is seen in f/u earlier than expected.  She is accompanied by her nurse, who supplements the history.  She was supposed to have an MRI brain since last visit but there was an error and this was not done. She had labs done since last visit and she has seen her PCP.  I reviewed those notes.  Her lithium was increased since last visit.  She is now on 150 mg in the AM and then 300 mg at night (was just 300 at night) and is now on ativan bid prn.  Asks me how many she can take per day.  They haven't noted increased tremor.  She did just get an iphone and is having trouble using the letters but her granddaughter showed her how to use the voice activated system so she is doing well with that.  Cannot eat with a spoon though or  things spill.  Depression seems worse as there are so family "things" going on.  Has an upcoming appt with [name].  Enjoyed vacation in [place].   PREVIOUS MEDICATIONS: Inderal (bradycardia), Topamax, clonazepam  ALLERGIES:   Allergies  Allergen Reactions  . Propranolol     Low blood pressure    CURRENT MEDICATIONS:  Current Outpatient Prescriptions on File Prior to Visit  Medication Sig Dispense Refill  . amLODipine (NORVASC) 10 MG tablet Take 1 tablet (10 mg total) by mouth daily.  90 tablet  3  . aspirin 81 MG tablet Take 81 mg by mouth daily.        . Calcium Carbonate-Vitamin D (CALCIUM 600/VITAMIN D PO) Take 1 tablet by mouth 2 (two) times daily.       . celecoxib (CELEBREX) 200 MG capsule Take 1 capsule (200 mg total) by mouth daily.  90 capsule  3  . cetirizine (ZYRTEC) 10 MG tablet Take 10 mg by mouth at bedtime.      . chlorthalidone (HYGROTON) 25 MG tablet Take 1  tablet (25 mg total) by mouth daily.  90 tablet  0  . Cholecalciferol (VITAMIN D3) 2000 UNITS TABS Take 2 tablets by mouth daily.       Marland Kitchen docusate-casanthranol (PERICOLACE) 100-30 MG per capsule daily as needed.        . fesoterodine (TOVIAZ) 8 MG TB24 tablet Take 1 tablet (8 mg total) by mouth daily.  90 tablet  1  . FLUoxetine (PROZAC) 40 MG capsule Take 40 mg by mouth daily.        Marland Kitchen levothyroxine (SYNTHROID, LEVOTHROID) 75 MCG tablet Take 1 tablet (75 mcg total) by mouth daily.  90 tablet  1  . lithium 150 MG capsule One tablet in the morning Take 2 tablets every evening      . losartan (COZAAR) 100 MG tablet Take 1 tablet (100 mg total) by mouth daily.  90 tablet  0  . moxifloxacin (AVELOX) 400 MG tablet Take 1 tablet (400 mg total) by mouth daily.  10 tablet  0  . Multiple Vitamins-Minerals (MULTIVITAMIN WITH MINERALS) tablet Take 1 tablet by mouth daily.        Marland Kitchen OLANZapine (ZYPREXA) 5 MG tablet Take 1/2 tablet daily      . Omega-3 Fatty Acids (FISH OIL) 1200 MG CAPS Take 1 capsule by mouth daily.        Marland Kitchen omeprazole (PRILOSEC) 40 MG capsule Take 40 mg by mouth 2 (two) times daily.       . potassium chloride SA (KLOR-CON M20) 20 MEQ tablet Take 1 tablet (20 mEq total) by mouth daily.  90 tablet  0  . risedronate (ACTONEL) 150 MG tablet Take 150 mg by mouth every 30 (thirty) days. with water on empty stomach, nothing by mouth or lie down for next 30 minutes.      . rosuvastatin (CRESTOR) 20 MG tablet Take 1 tablet (20 mg total) by mouth daily.  90 tablet  0  . valACYclovir (VALTREX) 500 MG tablet Take 500 mg by mouth as needed.       . vitamin B-12 (CYANOCOBALAMIN) 1000 MCG tablet Take 1,000 mcg by mouth daily.        . Wheat Dextrin (BENEFIBER) POWD Take 1 heaping teaspoon every morning  1 g  0  . zolpidem (AMBIEN) 5 MG tablet Take 1 tablet (5 mg total) by mouth at bedtime as needed for sleep.  30 tablet  0  . [DISCONTINUED] fesoterodine (  TOVIAZ) 8 MG TB24 Take 1 tablet (8 mg total) by  mouth daily.  90 tablet  3   No current facility-administered medications on file prior to visit.    PAST MEDICAL HISTORY:   Past Medical History  Diagnosis Date  . GERD (gastroesophageal reflux disease)   . Hyperlipidemia   . Anxiety   . Low back pain   . Osteoporosis   . Carotid disease, bilateral     carotid dopplers 12/10 with 40-59% bilateral ICA stenosis  . Bipolar disease, chronic   . Hypertension   . Depression     s/p ECT  . Migraines   . Heart murmur   . Carotid bruit     PAST SURGICAL HISTORY:   Past Surgical History  Procedure Laterality Date  . Esophagogastroduodenoscopy  02-26-04  . Appendectomy  1971  . Tubal ligation    . Augmentation mammaplasty    . Abdominal surgery      Laparotomy Ovarian cystctomy    SOCIAL HISTORY:   History   Social History  . Marital Status: Married    Spouse Name: Dr. Lyla Son    Number of Children: 2  . Years of Education: N/A   Occupational History  . housewife    Social History Main Topics  . Smoking status: Former Smoker -- 2.00 packs/day for 15 years    Types: Cigarettes    Quit date: 01/13/1971  . Smokeless tobacco: Never Used  . Alcohol Use: 2.0 oz/week    4 drink(s) per week     Comment: wine, three times per week  . Drug Use: No  . Sexual Activity: No   Other Topics Concern  . Not on file   Social History Narrative  . No narrative on file    FAMILY HISTORY:   Family Status  Relation Status Death Age  . Father Deceased     CAD  . Mother Deceased     COPD  . Brother Alive     skin CA (? type)  . Child Alive     2, healthy    ROS:    A complete 10 system review of systems was obtained and was unremarkable apart from what is mentioned above.  PHYSICAL EXAMINATION:    VITALS:   Filed Vitals:   04/08/14 1258  BP: 130/70  Pulse: 60  Resp: 14    GEN:  The patient appears younger than stated age and is in NAD.  Affect flat.  Nurse does much of talking for her today.   HEENT:   Normocephalic, atraumatic.  The mucous membranes are moist. The superficial temporal arteries are without ropiness or tenderness. CV:  RRR Lungs:  CTAB Neck/HEME:  There are no carotid bruits bilaterally.  Neurological examination:  Orientation: The patient is alert and oriented x3.  However, her nurse has to provide nearly all of the history  Cranial nerves: There is good facial symmetry. Pupils are equal round and reactive to light bilaterally. Fundoscopic exam reveals clear margins bilaterally. Extraocular muscles are intact. The visual fields are full to confrontational testing. The speech is fluent and clear. Soft palate rises symmetrically and there is no tongue deviation. Hearing is intact to conversational tone. Sensation: Sensation is intact to light touch throughout. Motor: Strength is 5/5 in the bilateral upper and lower extremities.   Shoulder shrug is equal and symmetric.  There is no pronator drift.   Movement examination: Tone: There is normal tone bilaterally Abnormal movements: There is an irregular  tremor of the outstretched hands that increases with intention.  It is overall fairly mild until she is asked to pour water from one glass to another, and she spills water all over, especially when the full glasses and the right hand.  She has fairly minimal difficulty with Archimedes spirals.  She has minimal difficulty writing a sentence. Coordination:  There is no significant decremation with RAM's, including alternating supination and pronation of the forearm, hand opening and closing, finger taps, heel taps and toe taps. Gait and Station: The patient has no difficulty arising out of a deep-seated chair without the use of the hands. The patient's stride length is normal.   LABS  Lab Results  Component Value Date   WBC 10.3 02/21/2014   HGB 11.8* 02/21/2014   HCT 35.4* 02/21/2014   MCV 95.9 02/21/2014   PLT 281 02/21/2014     Chemistry      Component Value Date/Time   NA 138  02/21/2014 0942   K 4.0 02/21/2014 0942   CL 103 02/21/2014 0942   CO2 25 02/21/2014 0942   BUN 19 02/21/2014 0942   CREATININE 0.81 02/21/2014 0942   CREATININE 0.9 09/06/2013 0947      Component Value Date/Time   CALCIUM 9.8 02/21/2014 0942   ALKPHOS 42 02/21/2014 0942   AST 31 02/21/2014 0942   ALT 45* 02/21/2014 0942   BILITOT 0.3 02/21/2014 0942     Lab Results  Component Value Date   LITHIUM 0.50* 02/21/2014   NA 138 02/21/2014   BUN 19 02/21/2014   CREATININE 0.81 02/21/2014   TSH 0.562 02/21/2014   WBC 10.3 02/21/2014     ASSESSMENT/PLAN:  1.  Tremor.  This is likely primarily due to lithium, although Zyprexa may contribute.  She may have some baseline essential tremor, but we won't know while she is on lithium.  She has apparently been free of alcohol now for 1 month.  I talked to her about my concerns with the Ativan, which she has been talked to previously by other physicians.  I am not going to manage this, and will leave this to her psychiatrist.  We did decide to increase the primidone to 50 mg twice a day, but expressed to her that this goes to liver, as does her lithium and most of her other medications, so I asked her to continue to abstain from alcohol.    -Talked to her about weighted utensils, and should her a video on the lift ware. She wants to hold on that for now  -I am going to go ahead and do an MRI of the brain as she may have some cerebellar atrophy from the alcohol exposure. 2.  Memory changes.  I suspect that this is likely from multiple ECT treatments.  Chronic alcohol exposure could certainly be playing a role here as well. 3. Follow up in 6 months, sooner if needed.

## 2014-04-08 NOTE — Patient Instructions (Signed)
1. We have scheduled you at [facility] for your MRI on 04/18/14 at 11:30 am. Please arrive 30 minutes prior and go to [facility]. If you need to change this appt please call [phone].

## 2014-04-17 DIAGNOSIS — F332 Major depressive disorder, recurrent severe without psychotic features: Secondary | ICD-10-CM | POA: Diagnosis not present

## 2014-04-18 ENCOUNTER — Telehealth: Payer: Self-pay | Admitting: Neurology

## 2014-04-18 ENCOUNTER — Ambulatory Visit
Admission: RE | Admit: 2014-04-18 | Discharge: 2014-04-18 | Disposition: A | Discharge status: Home / Self Care | Payer: Medicare Other | Source: Ambulatory Visit | Attending: Neurology | Admitting: Neurology

## 2014-04-18 DIAGNOSIS — R251 Tremor, unspecified: Secondary | ICD-10-CM

## 2014-04-18 DIAGNOSIS — R259 Unspecified abnormal involuntary movements: Secondary | ICD-10-CM | POA: Diagnosis not present

## 2014-04-18 NOTE — Telephone Encounter (Signed)
Patient's husband called for MR results. Made aware MR was unremarkable with mild chronic microvascular ischemic change. Made aware Dr Tat has not reviewed results yet and if she has any concerns I will call back. They agreed with this plan and will call with any questions/problems.

## 2014-04-19 NOTE — Telephone Encounter (Signed)
Looks normal

## 2014-04-22 ENCOUNTER — Other Ambulatory Visit: Payer: Self-pay

## 2014-04-22 MED ORDER — RISEDRONATE SODIUM 150 MG PO TABS: 150.0000 mg | ORAL_TABLET | ORAL | Status: DC

## 2014-04-23 ENCOUNTER — Other Ambulatory Visit: Payer: Self-pay

## 2014-04-23 ENCOUNTER — Other Ambulatory Visit: Payer: Medicare Other | Admitting: Internal Medicine

## 2014-04-23 DIAGNOSIS — F331 Major depressive disorder, recurrent, moderate: Secondary | ICD-10-CM | POA: Diagnosis not present

## 2014-04-23 MED ORDER — CHLORTHALIDONE 25 MG PO TABS: 25.0000 mg | ORAL_TABLET | Freq: Every day | ORAL | Status: DC

## 2014-04-23 MED ORDER — LOSARTAN POTASSIUM 100 MG PO TABS: 100.0000 mg | ORAL_TABLET | Freq: Every day | ORAL | Status: DC

## 2014-04-24 LAB — LITHIUM LEVEL: Lithium Lvl: 1.1 mEq/L (ref 0.80–1.40)

## 2014-04-29 ENCOUNTER — Other Ambulatory Visit: Payer: Self-pay

## 2014-04-29 ENCOUNTER — Encounter: Payer: Self-pay | Admitting: Neurology

## 2014-04-29 ENCOUNTER — Ambulatory Visit (INDEPENDENT_AMBULATORY_CARE_PROVIDER_SITE_OTHER): Payer: Medicare Other | Admitting: Neurology

## 2014-04-29 VITALS — BP 120/60 | HR 76 | Resp 16 | Ht 60.0 in | Wt 123.0 lb

## 2014-04-29 DIAGNOSIS — F332 Major depressive disorder, recurrent severe without psychotic features: Secondary | ICD-10-CM | POA: Diagnosis not present

## 2014-04-29 DIAGNOSIS — R259 Unspecified abnormal involuntary movements: Secondary | ICD-10-CM

## 2014-04-29 DIAGNOSIS — M1710 Unilateral primary osteoarthritis, unspecified knee: Secondary | ICD-10-CM | POA: Diagnosis not present

## 2014-04-29 DIAGNOSIS — R413 Other amnesia: Secondary | ICD-10-CM

## 2014-04-29 DIAGNOSIS — F319 Bipolar disorder, unspecified: Secondary | ICD-10-CM | POA: Diagnosis not present

## 2014-04-29 DIAGNOSIS — R251 Tremor, unspecified: Secondary | ICD-10-CM

## 2014-04-29 MED ORDER — ROSUVASTATIN CALCIUM 20 MG PO TABS: 20.0000 mg | ORAL_TABLET | Freq: Every day | ORAL | Status: DC

## 2014-04-29 MED ORDER — PRIMIDONE 50 MG PO TABS: ORAL_TABLET | ORAL | Status: DC

## 2014-04-29 MED ORDER — POTASSIUM CHLORIDE CRYS ER 20 MEQ PO TBCR: 20.0000 meq | EXTENDED_RELEASE_TABLET | Freq: Every day | ORAL | Status: DC

## 2014-04-29 NOTE — Progress Notes (Signed)
Meghan Oliver was seen today in the movement disorders clinic for neurologic consultation at the request of [name], MD.  The consultation is for the evaluation of tremor.  She is accompanied by her husband who supplements the history.  She supplied numerous records I had the opportunity to review.  She was a previous patient of Dr. love.  It appears that she last saw him about one year ago, in November, 2013.  The pt reports tremor x 2 years but her husband states at least 3-4 years.  It is in both hands, R worse than L.  She is R hand dominant.  It bothers her most with drinking coffee.  She has to drink out of a straw.  She cannot eat soup.  She does not drink caffeine.  Alcohol does improve tremor.  She has trouble writing because of the tremor.    The patient does have a very long history of depression.  She is currently on lithium and Zyprexa, which she has been on for 5 years and she states that both of these help.  She is status post ECT treatments began in January, 2000 and continued for 50 treatments of December, 2010.  In the past, the patient has been on Topamax for tremor, Inderal, 10 mg twice a day (this caused decrease in heart rate) and is currently on clonazepam.  She does not even remember being on the Topamax, so cannot remember if she had side effects or if it just did not work.  02/18/14 update:  Patient is seen back in followup today, accompanied by her husband as well as her nurse [name]), both of whom supplement the history.  Tremor is better than it was 6 weeks ago.  Had unsteadiness while in [place] and had to use a cane.  Has to have a railing to go up and down stairs.  Saw a neurologist in [place] last November as was becoming very sedated.  It was recommended that she decrease the lithium and she did.  Thinks that is why tremor is improved but admits that it tends to wax and wane.  They bring up a different concern.  Her husband recently discovered that the patient drinks 8-12 oz of  vodka per day.  Apparently, her nurse has been aware of this for years.  Pt saw her psychiatrist, [name], recently but didn't talk to him about this but now would like me to contact him about this.  She would like to see him before she goes on a big family trip to [place] on June 3.    04/08/14 update:  Pt is seen in f/u earlier than expected.  She is accompanied by her nurse, who supplements the history.  She was supposed to have an MRI brain since last visit but there was an error and this was not done. She had labs done since last visit and she has seen her PCP.  I reviewed those notes.  Her lithium was increased since last visit.  She is now on 150 mg in the AM and then 300 mg at night (was just 300 at night) and is now on ativan bid prn.  Asks me how many she can take per day.  They haven't noted increased tremor.  She did just get an iphone and is having trouble using the letters but her granddaughter showed her how to use the voice activated system so she is doing well with that.  Cannot eat with a spoon though or  things spill.  Depression seems worse as there are so family "things" going on.  Has an upcoming appt with [name].  Enjoyed vacation in [place].  04/29/14 update:  The patient was seen as a work in appointment today.  She is accompanied by her husband  who supplements the history.  She had an MRI of her brain on 04/18/2014.  It was essentially unremarkable.  No significant cerebellar atrophy.  Last visit, I did increase her primidone to 50 mg twice a day.  Her tremor has been increased.  She had 2 episodes where tremor was so bad that she could barely walk and she needed help down then stairs.  Drinking wine spritzers 3 days per week but no other alcohol.   Lithium dose has not changed since last visit but level has increased (as dose did change visit before that).  She reports that she is now off of ativan.     PREVIOUS MEDICATIONS: Inderal (bradycardia), Topamax, clonazepam  ALLERGIES:    Allergies  Allergen Reactions  . Propranolol     Low blood pressure    CURRENT MEDICATIONS:  Current Outpatient Prescriptions on File Prior to Visit  Medication Sig Dispense Refill  . amLODipine (NORVASC) 10 MG tablet Take 1 tablet (10 mg total) by mouth daily.  90 tablet  3  . aspirin 81 MG tablet Take 81 mg by mouth daily.        . Calcium Carbonate-Vitamin D (CALCIUM 600/VITAMIN D PO) Take 1 tablet by mouth 2 (two) times daily.       . celecoxib (CELEBREX) 200 MG capsule Take 1 capsule (200 mg total) by mouth daily.  90 capsule  3  . cetirizine (ZYRTEC) 10 MG tablet Take 10 mg by mouth at bedtime.      . chlorthalidone (HYGROTON) 25 MG tablet Take 1 tablet (25 mg total) by mouth daily.  90 tablet  1  . Cholecalciferol (VITAMIN D3) 2000 UNITS TABS Take 2 tablets by mouth daily.       Marland Kitchen docusate-casanthranol (PERICOLACE) 100-30 MG per capsule daily as needed.        . fesoterodine (TOVIAZ) 8 MG TB24 tablet Take 1 tablet (8 mg total) by mouth daily.  90 tablet  1  . FLUoxetine (PROZAC) 40 MG capsule Take 40 mg by mouth daily.        Marland Kitchen levothyroxine (SYNTHROID, LEVOTHROID) 75 MCG tablet Take 1 tablet (75 mcg total) by mouth daily.  90 tablet  1  . lithium 150 MG capsule One tablet in the morning Take 2 tablets every evening      . LORazepam (ATIVAN) 0.5 MG tablet Take 0.5 mg by mouth 2 (two) times daily.       Marland Kitchen losartan (COZAAR) 100 MG tablet Take 1 tablet (100 mg total) by mouth daily.  90 tablet  1  . moxifloxacin (AVELOX) 400 MG tablet Take 1 tablet (400 mg total) by mouth daily.  10 tablet  0  . Multiple Vitamins-Minerals (MULTIVITAMIN WITH MINERALS) tablet Take 1 tablet by mouth daily.        Marland Kitchen OLANZapine (ZYPREXA) 5 MG tablet Take 1/2 tablet daily      . Omega-3 Fatty Acids (FISH OIL) 1200 MG CAPS Take 1 capsule by mouth daily.        Marland Kitchen omeprazole (PRILOSEC) 40 MG capsule Take 40 mg by mouth 2 (two) times daily.       . risedronate (ACTONEL) 150 MG tablet Take 1 tablet (150 mg  total) by mouth every 30 (thirty) days. with water on empty stomach, nothing by mouth or lie down for next 30 minutes.  3 tablet  0  . valACYclovir (VALTREX) 500 MG tablet Take 500 mg by mouth as needed.       . vitamin B-12 (CYANOCOBALAMIN) 1000 MCG tablet Take 1,000 mcg by mouth daily.        . Wheat Dextrin (BENEFIBER) POWD Take 1 heaping teaspoon every morning  1 g  0  . zolpidem (AMBIEN) 5 MG tablet Take 1 tablet (5 mg total) by mouth at bedtime as needed for sleep.  30 tablet  0  . [DISCONTINUED] fesoterodine (TOVIAZ) 8 MG TB24 Take 1 tablet (8 mg total) by mouth daily.  90 tablet  3   No current facility-administered medications on file prior to visit.    PAST MEDICAL HISTORY:   Past Medical History  Diagnosis Date  . GERD (gastroesophageal reflux disease)   . Hyperlipidemia   . Anxiety   . Low back pain   . Osteoporosis   . Carotid disease, bilateral     carotid dopplers 12/10 with 40-59% bilateral ICA stenosis  . Bipolar disease, chronic   . Hypertension   . Depression     s/p ECT  . Migraines   . Heart murmur   . Carotid bruit     PAST SURGICAL HISTORY:   Past Surgical History  Procedure Laterality Date  . Esophagogastroduodenoscopy  02-26-04  . Appendectomy  1971  . Tubal ligation    . Augmentation mammaplasty    . Abdominal surgery      Laparotomy Ovarian cystctomy    SOCIAL HISTORY:   History   Social History  . Marital Status: Married    Spouse Name: Dr. Lyla Son    Number of Children: 2  . Years of Education: N/A   Occupational History  . housewife    Social History Main Topics  . Smoking status: Former Smoker -- 2.00 packs/day for 15 years    Types: Cigarettes    Quit date: 01/13/1971  . Smokeless tobacco: Never Used  . Alcohol Use: 2.0 oz/week    4 drink(s) per week     Comment: wine, three times per week  . Drug Use: No  . Sexual Activity: No   Other Topics Concern  . Not on file   Social History Narrative  . No narrative on file     FAMILY HISTORY:   Family Status  Relation Status Death Age  . Father Deceased     CAD  . Mother Deceased     COPD  . Brother Alive     skin CA (? type)  . Child Alive     2, healthy    ROS:    A complete 10 system review of systems was obtained and was unremarkable apart from what is mentioned above.  PHYSICAL EXAMINATION:    VITALS:   Filed Vitals:   04/29/14 0910  BP: 120/60  Pulse: 76  Resp: 16  Height: 5' (1.524 m)  Weight: 123 lb (55.792 kg)    GEN:  The patient appears younger than stated age and is in NAD.  Affect flat.  Nurse does much of talking for her today.   HEENT:  Normocephalic, atraumatic.  The mucous membranes are moist. The superficial temporal arteries are without ropiness or tenderness. CV:  RRR Lungs:  CTAB Neck/HEME:  There are no carotid bruits bilaterally.  Neurological examination:  Orientation: The patient  is alert and oriented x3.  However, her nurse has to provide nearly all of the history  Cranial nerves: There is good facial symmetry. Pupils are equal round and reactive to light bilaterally. Fundoscopic exam reveals clear margins bilaterally. Extraocular muscles are intact. The visual fields are full to confrontational testing. The speech is fluent and clear. Soft palate rises symmetrically and there is no tongue deviation. Hearing is intact to conversational tone. Sensation: Sensation is intact to light touch throughout. Motor: Strength is 5/5 in the bilateral upper and lower extremities.   Shoulder shrug is equal and symmetric.  There is no pronator drift.   Movement examination: Tone: There is normal tone bilaterally Abnormal movements: There is an irregular tremor of the outstretched hands that increases with intention.  It is overall fairly mild until she is asked to pour water from one glass to another, and she spills water all over, especially when the full glasses and the right hand.  She has some  difficulty with Archimedes  spirals.   Coordination:  There is no significant decremation with RAM's, including alternating supination and pronation of the forearm, hand opening and closing, finger taps, heel taps and toe taps. Gait and Station: The patient has no difficulty arising out of a deep-seated chair without the use of the hands. The patient's stride length is normal.  She cannot ambulate in tandem fashion.  Sways in romberg position with eyes closed.    LABS  Lab Results  Component Value Date   WBC 10.3 02/21/2014   HGB 11.8* 02/21/2014   HCT 35.4* 02/21/2014   MCV 95.9 02/21/2014   PLT 281 02/21/2014     Chemistry      Component Value Date/Time   NA 138 02/21/2014 0942   K 4.0 02/21/2014 0942   CL 103 02/21/2014 0942   CO2 25 02/21/2014 0942   BUN 19 02/21/2014 0942   CREATININE 0.81 02/21/2014 0942   CREATININE 0.9 09/06/2013 0947      Component Value Date/Time   CALCIUM 9.8 02/21/2014 0942   ALKPHOS 42 02/21/2014 0942   AST 31 02/21/2014 0942   ALT 45* 02/21/2014 0942   BILITOT 0.3 02/21/2014 0942     Lab Results  Component Value Date   LITHIUM 1.10 04/23/2014   NA 138 02/21/2014   BUN 19 02/21/2014   CREATININE 0.81 02/21/2014   TSH 0.562 02/21/2014   WBC 10.3 02/21/2014     ASSESSMENT/PLAN:  1.  Tremor.  The increase in tremor is likely due to the increase need for lithium.  Her lithium level was doubled 6 days ago compared to over the last year, which is fine but probably accounts for the increased in tremor; zyprexa may play a minor role as well.  She may have some baseline essential tremor, but we won't know while she is on lithium.   Stressed importance again of d/c alcohol all together as is drinking wine spritzers 3 days a week.  Can increase primidone to 50 mg -  2 in the AM, 1 at nightl  -Talked to her about weighted utensils, and should her a video on the lift ware. She wants to hold on that for now 2.  Memory changes.  I suspect that this is likely from multiple ECT treatments.  Chronic alcohol  exposure could certainly be playing a role here as well as the multiple other meds 3. Follow up in 4 months, sooner if needed.

## 2014-05-01 ENCOUNTER — Encounter (HOSPITAL_COMMUNITY): Payer: Medicare Other

## 2014-05-01 ENCOUNTER — Ambulatory Visit (HOSPITAL_COMMUNITY): Payer: Medicare Other | Attending: Cardiology | Admitting: Cardiology

## 2014-05-01 DIAGNOSIS — F332 Major depressive disorder, recurrent severe without psychotic features: Secondary | ICD-10-CM | POA: Diagnosis not present

## 2014-05-01 DIAGNOSIS — R911 Solitary pulmonary nodule: Secondary | ICD-10-CM

## 2014-05-01 DIAGNOSIS — I6529 Occlusion and stenosis of unspecified carotid artery: Secondary | ICD-10-CM

## 2014-05-01 DIAGNOSIS — Z79899 Other long term (current) drug therapy: Secondary | ICD-10-CM

## 2014-05-01 DIAGNOSIS — R0602 Shortness of breath: Secondary | ICD-10-CM

## 2014-05-01 DIAGNOSIS — I10 Essential (primary) hypertension: Secondary | ICD-10-CM

## 2014-05-01 DIAGNOSIS — E039 Hypothyroidism, unspecified: Secondary | ICD-10-CM

## 2014-05-01 DIAGNOSIS — I2510 Atherosclerotic heart disease of native coronary artery without angina pectoris: Secondary | ICD-10-CM

## 2014-05-01 NOTE — Progress Notes (Signed)
Carotid duplex performed 

## 2014-05-03 ENCOUNTER — Telehealth: Payer: Self-pay | Admitting: Neurology

## 2014-05-03 NOTE — Telephone Encounter (Signed)
Pt called requesting to be seen by Dr. Carles Collet this coming week. She stated that she got lost Tuesday and Thursday while driving and she is upset about it. Is there any way she can be see This coming week one Dr. Carles Collet gets back. C/B [phone]

## 2014-05-06 DIAGNOSIS — M1710 Unilateral primary osteoarthritis, unspecified knee: Secondary | ICD-10-CM | POA: Diagnosis not present

## 2014-05-07 NOTE — Telephone Encounter (Signed)
Should I make appt for patient?

## 2014-05-07 NOTE — Telephone Encounter (Signed)
Spoke with patient. She states she is better since she called and not interested in neuropsych testing right now. She will call with any changes.

## 2014-05-07 NOTE — Telephone Encounter (Signed)
I would bet that this is due to her medication.  First, tell her not to drive.  Then, make sure NO alcohol consumption is confusing this issue.  Finally, is she willing to pursue neuropsych testing.  She doesn't need to come to office for appt as this would be my recommendation.  Also, her lithium level was a little higher last visit.  Has she talked with Dr Clovis Pu about it yet?

## 2014-05-07 NOTE — Telephone Encounter (Signed)
See below

## 2014-05-09 ENCOUNTER — Other Ambulatory Visit: Payer: Self-pay | Admitting: Pulmonary Disease

## 2014-05-09 DIAGNOSIS — G4733 Obstructive sleep apnea (adult) (pediatric): Secondary | ICD-10-CM

## 2014-05-13 DIAGNOSIS — M1710 Unilateral primary osteoarthritis, unspecified knee: Secondary | ICD-10-CM | POA: Diagnosis not present

## 2014-05-16 DIAGNOSIS — F332 Major depressive disorder, recurrent severe without psychotic features: Secondary | ICD-10-CM | POA: Diagnosis not present

## 2014-05-22 DIAGNOSIS — F332 Major depressive disorder, recurrent severe without psychotic features: Secondary | ICD-10-CM | POA: Diagnosis not present

## 2014-06-04 ENCOUNTER — Telehealth: Payer: Self-pay | Admitting: Internal Medicine

## 2014-06-05 NOTE — Telephone Encounter (Signed)
Will order Prevnar 13 for her.

## 2014-06-05 NOTE — Telephone Encounter (Signed)
Left message for patient to come in for Pneumonia shot.

## 2014-06-06 ENCOUNTER — Ambulatory Visit (INDEPENDENT_AMBULATORY_CARE_PROVIDER_SITE_OTHER): Payer: Medicare Other | Admitting: Internal Medicine

## 2014-06-06 DIAGNOSIS — Z23 Encounter for immunization: Secondary | ICD-10-CM

## 2014-06-06 MED ORDER — PNEUMOCOCCAL 13-VAL CONJ VACC IM SUSP: 0.5000 mL | Freq: Once | INTRAMUSCULAR | Status: DC

## 2014-06-06 MED ORDER — PNEUMOCOCCAL 13-VAL CONJ VACC IM SUSP: 0.5000 mL | INTRAMUSCULAR | Status: DC

## 2014-06-06 NOTE — Addendum Note (Signed)
Addended by: Brett Canales on: 06/06/2014 01:26 PM   Modules accepted: Orders

## 2014-06-07 DIAGNOSIS — F332 Major depressive disorder, recurrent severe without psychotic features: Secondary | ICD-10-CM | POA: Diagnosis not present

## 2014-06-17 ENCOUNTER — Telehealth: Payer: Self-pay

## 2014-06-17 MED ORDER — VALACYCLOVIR HCL 500 MG PO TABS: 500.0000 mg | ORAL_TABLET | Freq: Every day | ORAL | Status: DC

## 2014-06-17 NOTE — Telephone Encounter (Signed)
Patient called requesting something for a fever blister.  She has a blister in the corner of her lip.  It has been there for a few days now.  [facility] [address]  Please advise.

## 2014-06-17 NOTE — Telephone Encounter (Signed)
:   Valtrex 500 mg daily for 5 days

## 2014-06-17 NOTE — Telephone Encounter (Signed)
Call in Valtrex

## 2014-06-18 DIAGNOSIS — M25559 Pain in unspecified hip: Secondary | ICD-10-CM | POA: Diagnosis not present

## 2014-06-18 DIAGNOSIS — IMO0002 Reserved for concepts with insufficient information to code with codable children: Secondary | ICD-10-CM | POA: Diagnosis not present

## 2014-06-24 DIAGNOSIS — F332 Major depressive disorder, recurrent severe without psychotic features: Secondary | ICD-10-CM | POA: Diagnosis not present

## 2014-06-24 DIAGNOSIS — M47817 Spondylosis without myelopathy or radiculopathy, lumbosacral region: Secondary | ICD-10-CM | POA: Diagnosis not present

## 2014-06-25 DIAGNOSIS — M25559 Pain in unspecified hip: Secondary | ICD-10-CM | POA: Diagnosis not present

## 2014-06-25 DIAGNOSIS — M76899 Other specified enthesopathies of unspecified lower limb, excluding foot: Secondary | ICD-10-CM | POA: Diagnosis not present

## 2014-06-27 DIAGNOSIS — M25559 Pain in unspecified hip: Secondary | ICD-10-CM | POA: Diagnosis not present

## 2014-06-27 DIAGNOSIS — M76899 Other specified enthesopathies of unspecified lower limb, excluding foot: Secondary | ICD-10-CM | POA: Diagnosis not present

## 2014-06-28 ENCOUNTER — Ambulatory Visit (INDEPENDENT_AMBULATORY_CARE_PROVIDER_SITE_OTHER): Payer: Medicare Other | Admitting: Internal Medicine

## 2014-06-28 ENCOUNTER — Encounter: Payer: Self-pay | Admitting: Internal Medicine

## 2014-06-28 VITALS — BP 128/68 | HR 64 | Temp 99.8°F

## 2014-06-28 DIAGNOSIS — I2510 Atherosclerotic heart disease of native coronary artery without angina pectoris: Secondary | ICD-10-CM

## 2014-06-28 DIAGNOSIS — R5381 Other malaise: Secondary | ICD-10-CM

## 2014-06-28 DIAGNOSIS — K130 Diseases of lips: Secondary | ICD-10-CM

## 2014-06-28 DIAGNOSIS — R5383 Other fatigue: Secondary | ICD-10-CM | POA: Diagnosis not present

## 2014-06-28 MED ORDER — NYSTATIN-TRIAMCINOLONE 100000-0.1 UNIT/GM-% EX OINT: 1.0000 "application " | TOPICAL_OINTMENT | Freq: Two times a day (BID) | CUTANEOUS | Status: DC

## 2014-06-28 NOTE — Progress Notes (Signed)
   Subjective:    Patient ID: Meghan Oliver, female    DOB: 25-Nov-1940, 73 y.o.   MRN: [id number]  HPI  Took Valtrex for fever blister left corner of mouth. Now has scabbing and it will not completely heal. Likely has cheilitis. Also complaining of fatigue. Has been suggested she have thyroid functions drawn. Also check CBC and complete metabolic panel. She's been having some issues with bursitis in her right hip. She's going to physical therapy. She's been placed on a six-day prednisone dosepak at [facility]. Her knees bother her some. She had injections in her knees several weeks ago. Planning to go to [place] for 2 months in December    Review of Systems     Objective:   Physical Exam  Lesion left corner of  mouth consistent with cheilitis      Assessment & Plan:  Cheilitis  Fatigue  History of bipolar disorder  Bursitis right hip  Plan: See above. Mycolog cream to apply the left quarter mouth twice daily. CBC, complete metabolic panel, and TSH pending

## 2014-06-28 NOTE — Patient Instructions (Addendum)
Lab work pending. Apply Mycolog cream to left corner of mouth twice daily until healed

## 2014-06-29 LAB — COMPREHENSIVE METABOLIC PANEL
ALK PHOS: 34 U/L — AB (ref 39–117)
ALT: 36 U/L — ABNORMAL HIGH (ref 0–35)
AST: 28 U/L (ref 0–37)
Albumin: 4 g/dL (ref 3.5–5.2)
BILIRUBIN TOTAL: 0.3 mg/dL (ref 0.2–1.2)
BUN: 23 mg/dL (ref 6–23)
CO2: 24 mEq/L (ref 19–32)
CREATININE: 0.81 mg/dL (ref 0.50–1.10)
Calcium: 9.4 mg/dL (ref 8.4–10.5)
Chloride: 101 mEq/L (ref 96–112)
GLUCOSE: 139 mg/dL — AB (ref 70–99)
Potassium: 4.1 mEq/L (ref 3.5–5.3)
Sodium: 135 mEq/L (ref 135–145)
TOTAL PROTEIN: 6.4 g/dL (ref 6.0–8.3)

## 2014-06-29 LAB — CBC WITH DIFFERENTIAL/PLATELET
BASOS PCT: 0 % (ref 0–1)
Basophils Absolute: 0 10*3/uL (ref 0.0–0.1)
EOS ABS: 0 10*3/uL (ref 0.0–0.7)
Eosinophils Relative: 0 % (ref 0–5)
HCT: 33.6 % — ABNORMAL LOW (ref 36.0–46.0)
Hemoglobin: 11.4 g/dL — ABNORMAL LOW (ref 12.0–15.0)
Lymphocytes Relative: 14 % (ref 12–46)
Lymphs Abs: 0.8 10*3/uL (ref 0.7–4.0)
MCH: 31.8 pg (ref 26.0–34.0)
MCHC: 33.9 g/dL (ref 30.0–36.0)
MCV: 93.6 fL (ref 78.0–100.0)
MONOS PCT: 2 % — AB (ref 3–12)
Monocytes Absolute: 0.1 10*3/uL (ref 0.1–1.0)
NEUTROS PCT: 84 % — AB (ref 43–77)
Neutro Abs: 4.9 10*3/uL (ref 1.7–7.7)
PLATELETS: 267 10*3/uL (ref 150–400)
RBC: 3.59 MIL/uL — ABNORMAL LOW (ref 3.87–5.11)
RDW: 13.9 % (ref 11.5–15.5)
WBC: 5.8 10*3/uL (ref 4.0–10.5)

## 2014-06-29 LAB — T4, FREE: FREE T4: 1.17 ng/dL (ref 0.80–1.80)

## 2014-06-29 LAB — TSH: TSH: 0.378 u[IU]/mL (ref 0.350–4.500)

## 2014-07-01 DIAGNOSIS — M76899 Other specified enthesopathies of unspecified lower limb, excluding foot: Secondary | ICD-10-CM | POA: Diagnosis not present

## 2014-07-01 DIAGNOSIS — M25559 Pain in unspecified hip: Secondary | ICD-10-CM | POA: Diagnosis not present

## 2014-07-08 DIAGNOSIS — M7060 Trochanteric bursitis, unspecified hip: Secondary | ICD-10-CM | POA: Diagnosis not present

## 2014-07-08 DIAGNOSIS — M25551 Pain in right hip: Secondary | ICD-10-CM | POA: Diagnosis not present

## 2014-07-10 ENCOUNTER — Ambulatory Visit (INDEPENDENT_AMBULATORY_CARE_PROVIDER_SITE_OTHER): Payer: Medicare Other | Admitting: Internal Medicine

## 2014-07-10 DIAGNOSIS — Z23 Encounter for immunization: Secondary | ICD-10-CM | POA: Diagnosis not present

## 2014-07-10 DIAGNOSIS — D649 Anemia, unspecified: Secondary | ICD-10-CM

## 2014-07-10 NOTE — Patient Instructions (Signed)
To have iron/ iron-binding capacity, B12 and folate levels drawn later  this week along with 3 Hemoccult cards. Flu vaccine given.

## 2014-07-10 NOTE — Progress Notes (Signed)
   Subjective:    Patient ID: Meghan Oliver, female    DOB: [birth year], 73 y.o.   MRN: [id number]  HPI  Meghan Oliver is here today for flu vaccine. She remains very mildly anemic but has been that way for a number of years. Husband who is a retired physician has asked about checking for iron levels she says. We will be happy to do that later this week when phlebotomist is available. Reviewed lab work with her. TSH was normal. MCV is normal.    Review of Systems     Objective:   Physical Exam  Not examined. Spent 15 minutes speaking with patient about these issues and she was given a flu vaccine      Assessment & Plan:  Flu vaccine  Anemia-normocytic  Plan: To have B12, folate, iron and iron-binding capacity done here later this week.

## 2014-07-11 ENCOUNTER — Other Ambulatory Visit: Payer: Medicare Other | Admitting: Internal Medicine

## 2014-07-11 DIAGNOSIS — R5383 Other fatigue: Secondary | ICD-10-CM | POA: Diagnosis not present

## 2014-07-11 DIAGNOSIS — D649 Anemia, unspecified: Secondary | ICD-10-CM | POA: Diagnosis not present

## 2014-07-11 DIAGNOSIS — M7060 Trochanteric bursitis, unspecified hip: Secondary | ICD-10-CM | POA: Diagnosis not present

## 2014-07-11 DIAGNOSIS — M25551 Pain in right hip: Secondary | ICD-10-CM | POA: Diagnosis not present

## 2014-07-11 DIAGNOSIS — R609 Edema, unspecified: Secondary | ICD-10-CM | POA: Diagnosis not present

## 2014-07-11 LAB — IRON AND TIBC
%SAT: 42 % (ref 20–55)
IRON: 111 ug/dL (ref 42–145)
TIBC: 267 ug/dL (ref 250–470)
UIBC: 156 ug/dL (ref 125–400)

## 2014-07-11 LAB — FOLATE

## 2014-07-11 LAB — VITAMIN B12: Vitamin B-12: 1512 pg/mL — ABNORMAL HIGH (ref 211–911)

## 2014-07-12 DIAGNOSIS — F3341 Major depressive disorder, recurrent, in partial remission: Secondary | ICD-10-CM | POA: Diagnosis not present

## 2014-07-12 DIAGNOSIS — F411 Generalized anxiety disorder: Secondary | ICD-10-CM | POA: Diagnosis not present

## 2014-07-18 DIAGNOSIS — M7060 Trochanteric bursitis, unspecified hip: Secondary | ICD-10-CM | POA: Diagnosis not present

## 2014-07-18 DIAGNOSIS — M25551 Pain in right hip: Secondary | ICD-10-CM | POA: Diagnosis not present

## 2014-07-23 ENCOUNTER — Telehealth: Payer: Self-pay | Admitting: Pulmonary Disease

## 2014-07-23 DIAGNOSIS — M25551 Pain in right hip: Secondary | ICD-10-CM | POA: Diagnosis not present

## 2014-07-23 DIAGNOSIS — M7060 Trochanteric bursitis, unspecified hip: Secondary | ICD-10-CM | POA: Diagnosis not present

## 2014-07-23 NOTE — Telephone Encounter (Signed)
Called and spoke to pt. Pt cancelled sleep consult appt on 10/23 d/t being out of town. Pt requesting recs by [facility] d/t pt not wearing CPAP. Pt states the mask is not fitting well and she feels it is leaking' pt states she really does not like using the CPAP. Pt states she cannot remember the last time she used the CPAP. Pt denies any machine or pressure issues.   Pt last seen in 04/2011 and was wanting to reschedule the appt. [facility] does not have any afternoon consult openings for sleep till December. Pt wanting to be seen sooner than December.   [facility] please advise on recs for CPAP and if pt can be worked in for a morning sleep consult appt.

## 2014-07-24 DIAGNOSIS — F3341 Major depressive disorder, recurrent, in partial remission: Secondary | ICD-10-CM | POA: Diagnosis not present

## 2014-07-24 NOTE — Telephone Encounter (Signed)
Pt scheduled to see [facility] 08/08/14 at 11am for sleep consult Nothing further needed.

## 2014-07-24 NOTE — Telephone Encounter (Signed)
I am happy to see her again to go over options for treatment.  But it really comes down to either cpap or a dental appliance.  I can work with her on mask fit to try and make cpap more palatable if she wishes.  She has tried a dental appliance in the past and changed to cpap.  She can also go back to her dental appliance.  Let me know what she wishes to do.

## 2014-07-25 DIAGNOSIS — M7060 Trochanteric bursitis, unspecified hip: Secondary | ICD-10-CM | POA: Diagnosis not present

## 2014-07-25 DIAGNOSIS — M25551 Pain in right hip: Secondary | ICD-10-CM | POA: Diagnosis not present

## 2014-07-26 ENCOUNTER — Institutional Professional Consult (permissible substitution): Payer: Medicare Other | Admitting: Pulmonary Disease

## 2014-08-01 DIAGNOSIS — M7060 Trochanteric bursitis, unspecified hip: Secondary | ICD-10-CM | POA: Diagnosis not present

## 2014-08-01 DIAGNOSIS — M25551 Pain in right hip: Secondary | ICD-10-CM | POA: Diagnosis not present

## 2014-08-05 ENCOUNTER — Encounter: Payer: Self-pay | Admitting: Internal Medicine

## 2014-08-05 DIAGNOSIS — M25551 Pain in right hip: Secondary | ICD-10-CM | POA: Diagnosis not present

## 2014-08-05 DIAGNOSIS — M7060 Trochanteric bursitis, unspecified hip: Secondary | ICD-10-CM | POA: Diagnosis not present

## 2014-08-06 DIAGNOSIS — M7060 Trochanteric bursitis, unspecified hip: Secondary | ICD-10-CM | POA: Diagnosis not present

## 2014-08-06 DIAGNOSIS — M25551 Pain in right hip: Secondary | ICD-10-CM | POA: Diagnosis not present

## 2014-08-08 ENCOUNTER — Institutional Professional Consult (permissible substitution): Payer: Medicare Other | Admitting: Pulmonary Disease

## 2014-08-09 DIAGNOSIS — F3341 Major depressive disorder, recurrent, in partial remission: Secondary | ICD-10-CM | POA: Diagnosis not present

## 2014-08-12 DIAGNOSIS — M25551 Pain in right hip: Secondary | ICD-10-CM | POA: Diagnosis not present

## 2014-08-12 DIAGNOSIS — M7060 Trochanteric bursitis, unspecified hip: Secondary | ICD-10-CM | POA: Diagnosis not present

## 2014-08-14 DIAGNOSIS — D229 Melanocytic nevi, unspecified: Secondary | ICD-10-CM | POA: Diagnosis not present

## 2014-08-14 DIAGNOSIS — Z8619 Personal history of other infectious and parasitic diseases: Secondary | ICD-10-CM | POA: Diagnosis not present

## 2014-08-14 DIAGNOSIS — L821 Other seborrheic keratosis: Secondary | ICD-10-CM | POA: Diagnosis not present

## 2014-08-14 DIAGNOSIS — Z86018 Personal history of other benign neoplasm: Secondary | ICD-10-CM | POA: Diagnosis not present

## 2014-08-16 ENCOUNTER — Telehealth: Payer: Self-pay | Admitting: Neurology

## 2014-08-16 NOTE — Telephone Encounter (Signed)
Pt called to cancel her 08/21/14 f/u appt. Pt is going to be out of town and will call later To  r/s her appt.

## 2014-08-19 DIAGNOSIS — M25551 Pain in right hip: Secondary | ICD-10-CM | POA: Diagnosis not present

## 2014-08-19 DIAGNOSIS — M7060 Trochanteric bursitis, unspecified hip: Secondary | ICD-10-CM | POA: Diagnosis not present

## 2014-08-21 ENCOUNTER — Ambulatory Visit: Payer: Medicare Other | Admitting: Neurology

## 2014-08-21 DIAGNOSIS — Z1231 Encounter for screening mammogram for malignant neoplasm of breast: Secondary | ICD-10-CM | POA: Diagnosis not present

## 2014-08-21 DIAGNOSIS — Z803 Family history of malignant neoplasm of breast: Secondary | ICD-10-CM | POA: Diagnosis not present

## 2014-08-22 ENCOUNTER — Institutional Professional Consult (permissible substitution): Payer: Medicare Other | Admitting: Pulmonary Disease

## 2014-08-26 ENCOUNTER — Other Ambulatory Visit: Payer: Self-pay | Admitting: *Deleted

## 2014-08-26 ENCOUNTER — Other Ambulatory Visit: Payer: Self-pay | Admitting: Cardiology

## 2014-08-26 ENCOUNTER — Other Ambulatory Visit: Payer: Self-pay | Admitting: Internal Medicine

## 2014-08-26 MED ORDER — ROSUVASTATIN CALCIUM 20 MG PO TABS: 20.0000 mg | ORAL_TABLET | Freq: Every day | ORAL | Status: DC

## 2014-08-26 MED ORDER — OMEPRAZOLE 40 MG PO CPDR
40.0000 mg | DELAYED_RELEASE_CAPSULE | Freq: Two times a day (BID) | ORAL | Status: DC
Start: 2014-08-26 — End: 2015-05-12

## 2014-08-26 NOTE — Telephone Encounter (Signed)
Rx was previously sent to [facility] on 02/01/14 for #180 with 3 refills. Receipt was confirmed by pharmacy at that time. However, I contacted [facility] and they have nothing on file for patient. I have sent a new prescription.

## 2014-09-02 DIAGNOSIS — Z961 Presence of intraocular lens: Secondary | ICD-10-CM | POA: Diagnosis not present

## 2014-09-04 DIAGNOSIS — F3341 Major depressive disorder, recurrent, in partial remission: Secondary | ICD-10-CM | POA: Diagnosis not present

## 2014-09-05 ENCOUNTER — Other Ambulatory Visit: Payer: Medicare Other | Admitting: Internal Medicine

## 2014-09-05 DIAGNOSIS — F332 Major depressive disorder, recurrent severe without psychotic features: Secondary | ICD-10-CM | POA: Diagnosis not present

## 2014-09-05 DIAGNOSIS — F329 Major depressive disorder, single episode, unspecified: Secondary | ICD-10-CM | POA: Diagnosis not present

## 2014-09-06 ENCOUNTER — Telehealth: Payer: Self-pay | Admitting: Internal Medicine

## 2014-09-06 ENCOUNTER — Telehealth: Payer: Self-pay

## 2014-09-06 LAB — LITHIUM LEVEL: Lithium Lvl: 0.5 mEq/L — ABNORMAL LOW (ref 0.80–1.40)

## 2014-09-06 NOTE — Telephone Encounter (Signed)
Fax Lithium Levels to Dr. Lynder Parents upon receipt each time.

## 2014-09-06 NOTE — Telephone Encounter (Signed)
Patient aware of low lithium results.  Results faxed to Dr Clovis Pu.  Patient will follow up with provider.

## 2014-09-06 NOTE — Telephone Encounter (Signed)
-----   Message from [name], MD sent at 09/06/2014  2:39 PM EST ----- Please call pt. Level is low at 0.50. We are faxing result to [name].

## 2014-09-12 ENCOUNTER — Encounter: Payer: Self-pay | Admitting: Internal Medicine

## 2014-09-30 ENCOUNTER — Encounter: Payer: Self-pay | Admitting: Internal Medicine

## 2014-10-03 ENCOUNTER — Other Ambulatory Visit: Payer: Self-pay | Admitting: *Deleted

## 2014-10-03 DIAGNOSIS — N3281 Overactive bladder: Secondary | ICD-10-CM

## 2014-10-03 MED ORDER — ROSUVASTATIN CALCIUM 20 MG PO TABS: 20.0000 mg | ORAL_TABLET | Freq: Every day | ORAL | Status: DC

## 2014-10-03 MED ORDER — LOSARTAN POTASSIUM 100 MG PO TABS: 100.0000 mg | ORAL_TABLET | Freq: Every day | ORAL | Status: DC

## 2014-10-03 MED ORDER — AMLODIPINE BESYLATE 10 MG PO TABS: 10.0000 mg | ORAL_TABLET | Freq: Every day | ORAL | Status: DC

## 2014-10-03 MED ORDER — CELECOXIB 200 MG PO CAPS
200.0000 mg | ORAL_CAPSULE | Freq: Every day | ORAL | Status: DC
Start: 2014-10-03 — End: 2015-08-01

## 2014-10-03 MED ORDER — CHLORTHALIDONE 25 MG PO TABS: 25.0000 mg | ORAL_TABLET | Freq: Every day | ORAL | Status: DC

## 2014-10-03 MED ORDER — LEVOTHYROXINE SODIUM 75 MCG PO TABS: 75.0000 ug | ORAL_TABLET | Freq: Every day | ORAL | Status: DC

## 2014-10-28 ENCOUNTER — Telehealth: Payer: Self-pay | Admitting: Internal Medicine

## 2014-10-28 NOTE — Telephone Encounter (Signed)
She's in [place] and has a sore on the corner of her mouth.  She would like to know if you would call her in a prescription for the sore.    Rx:  Valtrex 500 mg.  Take 1 Tablet daily as needed.      [phone]

## 2014-10-28 NOTE — Telephone Encounter (Signed)
Call in Valtrex 500 mg days x 5 days with 2 refills to [facility]

## 2014-10-28 NOTE — Telephone Encounter (Signed)
Called in script to [facility] as requested for Valtrex 500 mg

## 2014-11-01 DIAGNOSIS — F3341 Major depressive disorder, recurrent, in partial remission: Secondary | ICD-10-CM | POA: Diagnosis not present

## 2014-11-04 ENCOUNTER — Other Ambulatory Visit: Payer: Self-pay | Admitting: Obstetrics and Gynecology

## 2014-11-04 ENCOUNTER — Other Ambulatory Visit: Payer: Self-pay | Admitting: Cardiology

## 2014-11-11 ENCOUNTER — Telehealth: Payer: Self-pay | Admitting: *Deleted

## 2014-11-11 NOTE — Telephone Encounter (Signed)
We cannot call in narcotic pain medication. What is she requesting? Shouldn't she be seen by physician there?

## 2014-11-11 NOTE — Telephone Encounter (Signed)
Patient states she has a headache located just above her left ear. She states she took some of her husbands prednisone and hydrocodone with no relief she would like [facility] [place] [phone]. Please advise. She can be reached on her mobile phone [phone] or home number [phone]

## 2014-11-11 NOTE — Telephone Encounter (Signed)
Spoke with patient advised her she needed to be seen by a Dr for evaluation and treatment of her symptoms.

## 2014-11-12 ENCOUNTER — Telehealth: Payer: Self-pay | Admitting: Neurology

## 2014-11-12 NOTE — Telephone Encounter (Signed)
Pt wants to talk to someone because she is having headaches please call her at [phone]

## 2014-11-13 NOTE — Telephone Encounter (Signed)
Left message on machine for patient to call back. To let her know she needs evaluated since this is a new onset headache.

## 2014-11-13 NOTE — Telephone Encounter (Signed)
Spoke with patient and made her aware she should be evaluated. She expressed understanding and will call if needed.

## 2014-11-13 NOTE — Telephone Encounter (Signed)
Spoke with patient and she states she has had an ongoing headache since Sunday. It is a "ripping" pain on the left side of her head sometimes on the upper part of the scalp and sometimes below the ear. She has been taking Ibuprofen since Monday which relieves the pain, but it does not go away completely. She has had this years ago, but not in a long time. She has never been on medication for headaches. Explained to her that since we don't treat her for headaches she may need to start with her PCP, but I would let Dr Tat know what is going on. [place] states [place] and her PCP would not prescribe her anything without seeing her. Please advise. Will call patient back on her cell phone [phone] after 11:30 am.

## 2014-11-13 NOTE — Telephone Encounter (Signed)
I think that she probably needs a CT brain if this is new for her.  Can she go to [facility] wherever she is to make sure that no sinus infection, etc?

## 2014-12-02 ENCOUNTER — Other Ambulatory Visit: Payer: Self-pay | Admitting: *Deleted

## 2014-12-02 DIAGNOSIS — N3281 Overactive bladder: Secondary | ICD-10-CM

## 2014-12-02 MED ORDER — FESOTERODINE FUMARATE ER 8 MG PO TB24
8.0000 mg | ORAL_TABLET | Freq: Every day | ORAL | Status: DC
Start: 2014-12-02 — End: 2015-08-13

## 2014-12-02 NOTE — Telephone Encounter (Signed)
[facility]. [name] notified

## 2014-12-10 DIAGNOSIS — F3130 Bipolar disorder, current episode depressed, mild or moderate severity, unspecified: Secondary | ICD-10-CM | POA: Diagnosis not present

## 2014-12-10 DIAGNOSIS — R350 Frequency of micturition: Secondary | ICD-10-CM | POA: Diagnosis not present

## 2014-12-10 DIAGNOSIS — N390 Urinary tract infection, site not specified: Secondary | ICD-10-CM | POA: Diagnosis not present

## 2014-12-26 DIAGNOSIS — F333 Major depressive disorder, recurrent, severe with psychotic symptoms: Secondary | ICD-10-CM | POA: Diagnosis not present

## 2015-01-16 ENCOUNTER — Ambulatory Visit (INDEPENDENT_AMBULATORY_CARE_PROVIDER_SITE_OTHER): Payer: Medicare Other | Admitting: Physician Assistant

## 2015-01-16 ENCOUNTER — Encounter: Payer: Self-pay | Admitting: Physician Assistant

## 2015-01-16 ENCOUNTER — Telehealth: Payer: Self-pay

## 2015-01-16 VITALS — BP 102/48 | HR 53 | Ht 60.0 in | Wt 121.4 lb

## 2015-01-16 DIAGNOSIS — F319 Bipolar disorder, unspecified: Secondary | ICD-10-CM | POA: Diagnosis not present

## 2015-01-16 DIAGNOSIS — R079 Chest pain, unspecified: Secondary | ICD-10-CM

## 2015-01-16 DIAGNOSIS — F3341 Major depressive disorder, recurrent, in partial remission: Secondary | ICD-10-CM | POA: Diagnosis not present

## 2015-01-16 DIAGNOSIS — I2510 Atherosclerotic heart disease of native coronary artery without angina pectoris: Secondary | ICD-10-CM | POA: Diagnosis not present

## 2015-01-16 LAB — COMPREHENSIVE METABOLIC PANEL
ALK PHOS: 36 U/L — AB (ref 39–117)
ALT: 26 U/L (ref 0–35)
AST: 25 U/L (ref 0–37)
Albumin: 3.7 g/dL (ref 3.5–5.2)
BUN: 25 mg/dL — ABNORMAL HIGH (ref 6–23)
CALCIUM: 9.7 mg/dL (ref 8.4–10.5)
CHLORIDE: 103 meq/L (ref 96–112)
CO2: 28 mEq/L (ref 19–32)
Creatinine, Ser: 0.88 mg/dL (ref 0.40–1.20)
GFR: 66.58 mL/min (ref >60.00)
Glucose, Bld: 116 mg/dL — ABNORMAL HIGH (ref 70–99)
POTASSIUM: 3.2 meq/L — AB (ref 3.5–5.1)
Sodium: 136 mEq/L (ref 135–145)
Total Bilirubin: 0.2 mg/dL (ref 0.2–1.2)
Total Protein: 6.2 g/dL (ref 6.0–8.3)

## 2015-01-16 LAB — CBC
HCT: 34.3 % — ABNORMAL LOW (ref 36.0–46.0)
Hemoglobin: 11.7 g/dL — ABNORMAL LOW (ref 12.0–15.0)
MCHC: 34.1 g/dL (ref 30.0–36.0)
MCV: 93.4 fl (ref 78.0–100.0)
Platelets: 225 10*3/uL (ref 150.0–400.0)
RBC: 3.67 Mil/uL — ABNORMAL LOW (ref 3.87–5.11)
RDW: 12.8 % (ref 11.5–15.5)
WBC: 9 10*3/uL (ref 4.0–10.5)

## 2015-01-16 LAB — TSH: TSH: 1.5 u[IU]/mL (ref 0.35–4.50)

## 2015-01-16 NOTE — Progress Notes (Signed)
Cardiology Office Note   Date:  01/16/2015   ID:  Meghan Oliver, DOB 1941-04-12, MRN [id number]  PCP:  [name], MD  Cardiologist:  Dr. Aundra Dubin   CC: Chest pain     History of Present Illness: Meghan Oliver is a 74 y.o. female bipolar disorder, carotid stenosis, HTN, GERD, HLD, CAD on CT scan and hyperlipidemia who was added on to our schedule today for chest pain.   She had an ETT (7/13) with 5'45" exercise, 71% MPHR (wanted to stop), no ischemic ECG changes but did not reach target heart rate. 2D ECHO (09/05/13) EF 65-70%, Mild LVH, normal EF, mild AI, mild pulmonary hypertension. She was last seen by Dr. Aundra Dubin in 09/2013 and felt to be stable from a cardiac standpoint at that time.   She was added onto the FLEX schedule today for evaluation of chest pain. The pt's husband (Dr. Lyla Son) called the office because the pt has been experiencing chest pain for the past 3 weeks. The pt has felt this was related to her reflux, but it was not relieved by prilosec or tums. It is intermittent and seems to be worse with lying flat. It is a burning in her neck that radiates up into her jaw. No associated SOB, nausea or diaphoresis. It is not related to exertion or meals. It is intermittent and lasts minutes to hours. No lightheadedness, dizziness, syncope. No orthopnea, PND or LE edema. She is currently in a severe depressive swing of her bipolar disorder and they are taking her up to get ECT next week at a psychiatric institution.    PMH: 1. GERD 2. Bipolar disorder: On lithium. History of ECT.  3. HTN 4. Tremor 5. Hyperlipidemia 6. Carotid stenosis: Carotid dopplers (7/14) with 40-59% bilateral ICA stenosis.  7. CAD: Coronary calcium seen on CT. ETT (7/13) with 5'45" exercise, 71% MPHR (wanted to stop), no ischemic ECG changes but did not reach target heart rate.  8.  2D ECHO (09/05/13) EF 65-70%, Mild LVH, normal EF, mild AI, mild pulmonary hypertension. 9. Lung nodules 10.  Bradycardia: asymptomatic     Past Medical History  Diagnosis Date  . GERD (gastroesophageal reflux disease)   . Hyperlipidemia   . Anxiety   . Low back pain   . Osteoporosis   . Carotid disease, bilateral     carotid dopplers 12/10 with 40-59% bilateral ICA stenosis  . Bipolar disease, chronic   . Hypertension   . Depression     s/p ECT  . Migraines   . Heart murmur   . Carotid bruit     Past Surgical History  Procedure Laterality Date  . Esophagogastroduodenoscopy  02-26-04  . Appendectomy  1971  . Tubal ligation    . Augmentation mammaplasty    . Abdominal surgery      Laparotomy Ovarian cystctomy     Current Outpatient Prescriptions  Medication Sig Dispense Refill  . amLODipine (NORVASC) 10 MG tablet Take 1 tablet (10 mg total) by mouth daily. 90 tablet 3  . aspirin 81 MG tablet Take 81 mg by mouth daily.      . Calcium Carbonate-Vitamin D (CALCIUM 600/VITAMIN D PO) Take 1 tablet by mouth 2 (two) times daily.     . celecoxib (CELEBREX) 200 MG capsule Take 1 capsule (200 mg total) by mouth daily. 90 capsule 3  . cetirizine (ZYRTEC) 10 MG tablet Take 10 mg by mouth at bedtime.    . chlorthalidone (HYGROTON) 25 MG  tablet Take 1 tablet (25 mg total) by mouth daily. 90 tablet 3  . Cholecalciferol (VITAMIN D3) 2000 UNITS TABS Take 2 tablets by mouth daily.     Marland Kitchen docusate-casanthranol (PERICOLACE) 100-30 MG per capsule daily as needed.      . fesoterodine (TOVIAZ) 8 MG TB24 tablet Take 1 tablet (8 mg total) by mouth daily. 90 tablet 3  . FLUoxetine (PROZAC) 10 MG capsule Take 10 mg by mouth daily.    Marland Kitchen FLUoxetine (PROZAC) 40 MG capsule Take 40 mg by mouth daily.      Marland Kitchen levothyroxine (SYNTHROID, LEVOTHROID) 75 MCG tablet Take 1 tablet (75 mcg total) by mouth daily. 90 tablet 1  . lithium carbonate 150 MG capsule Take 450 mg by mouth at bedtime.    Marland Kitchen LORazepam (ATIVAN) 0.5 MG tablet Take 0.5 mg by mouth as needed for anxiety.     Marland Kitchen losartan (COZAAR) 100 MG tablet Take 1  tablet (100 mg total) by mouth daily. 90 tablet 3  . Multiple Vitamins-Minerals (MULTIVITAMIN WITH MINERALS) tablet Take 1 tablet by mouth daily.      Marland Kitchen nystatin-triamcinolone ointment (MYCOLOG) Apply 1 application topically as needed.    Marland Kitchen OLANZapine (ZYPREXA) 5 MG tablet Take 5 mg by mouth at bedtime.    . Omega-3 Fatty Acids (FISH OIL) 1200 MG CAPS Take 1 capsule by mouth daily.      Marland Kitchen omeprazole (PRILOSEC) 40 MG capsule Take 1 capsule (40 mg total) by mouth 2 (two) times daily. 180 capsule 1  . potassium chloride SA (K-DUR,KLOR-CON) 20 MEQ tablet Take 20 mEq by mouth as needed.    . primidone (MYSOLINE) 50 MG tablet Take 50 mg by mouth 2 (two) times daily.    . risedronate (ACTONEL) 150 MG tablet Take 1 tablet (150 mg total) by mouth every 30 (thirty) days. with water on empty stomach, nothing by mouth or lie down for next 30 minutes. 3 tablet 0  . rosuvastatin (CRESTOR) 20 MG tablet Take 1 tablet (20 mg total) by mouth daily. 90 tablet 3  . valACYclovir (VALTREX) 500 MG tablet Take 1 tablet (500 mg total) by mouth daily. 5 tablet 11  . vitamin B-12 (CYANOCOBALAMIN) 1000 MCG tablet Take 1,000 mcg by mouth daily.      Marland Kitchen zolpidem (AMBIEN) 5 MG tablet Take 1 tablet (5 mg total) by mouth at bedtime as needed for sleep. 30 tablet 0   Current Facility-Administered Medications  Medication Dose Route Frequency Provider Last Rate Last Dose  . pneumococcal 13-valent conjugate vaccine (PREVNAR 13) injection 0.5 mL  0.5 mL Intramuscular Tomorrow-1000 [name], MD      . pneumococcal 13-valent conjugate vaccine (PREVNAR 13) injection 0.5 mL  0.5 mL Intramuscular Once [name], MD        Allergies:   Propranolol    Social History:  The patient  reports that she quit smoking about 44 years ago. Her smoking use included Cigarettes. She has a 30 pack-year smoking history. She has never used smokeless tobacco. She reports that she drinks about 2.0 oz of alcohol per week. She reports that she does  not use illicit drugs.   Family History:  The patient's family history includes Breast cancer in her paternal aunt and paternal grandmother; Heart attack (age of onset: 63) in her father; Heart disease in her father; Hypertension in her father. There is no history of Colon cancer.    ROS:  Please see the history of present illness.  Otherwise, review of systems are positive for none.   All other systems are reviewed and negative.    PHYSICAL EXAM: VS:  BP 102/48 mmHg  Pulse 53  Ht 5' (1.524 m)  Wt 121 lb 6.4 oz (55.067 kg)  BMI 23.71 kg/m2 , BMI Body mass index is 23.71 kg/(m^2). GEN: Well nourished, well developed, in no acute distress HEENT: normal Neck: no JVD, carotid bruits, or masses Cardiac: RRR; no murmurs, rubs, or gallops,no edema  Respiratory:  clear to auscultation bilaterally, normal work of breathing GI: soft, nontender, nondistended, + BS MS: no deformity or atrophy Skin: warm and dry, no rash Neuro:  Strength and sensation are intact Psych: euthymic mood, full affect   EKG:  EKG is ordered today. The ekg ordered today demonstrates sinus bradycardia HR 53. Non specific TW abnormality similar to previous   Recent Labs: 06/28/2014: ALT 36*; BUN 23; Creatinine 0.81; Hemoglobin 11.4*; Platelets 267; Potassium 4.1; Sodium 135; TSH 0.378    Lipid Panel    Component Value Date/Time   CHOL 201* 02/21/2014 0942   TRIG 65 02/21/2014 0942   HDL 126 02/21/2014 0942   CHOLHDL 1.6 02/21/2014 0942   VLDL 13 02/21/2014 0942   LDLCALC 62 02/21/2014 0942   LDLDIRECT 98.2 04/10/2007 1150      Wt Readings from Last 3 Encounters:  01/16/15 121 lb 6.4 oz (55.067 kg)  04/29/14 123 lb (55.792 kg)  02/26/14 130 lb (58.968 kg)      Other studies Reviewed: Additional studies/ records that were reviewed today include: 2D ECHO, carotid duplex Review of the above records demonstrates:  -- 2D ECHO (09/05/13)- EF 65-70%, Mild LVH, normal EF, mild AI, mild pulmonary  hypertension. The mild LVH and mild pulmonary hypertension suggest some LV diastolic dysfunction.  -- Carotid duplex (05/01/2014)- Stable 40-59% bilateral ICA stenosis. Repeat in 1 year --  ETT (7/13) with 5'45" exercise, 71% MPHR (wanted to stop), no ischemic ECG changes but did not reach target heart rate.   ASSESSMENT AND PLAN:  Meghan Oliver is a 74 y.o. female bipolar disorder, carotid stenosis, HTN, GERD, HLD, CAD on CT scan and hyperlipidemia who was added on to our schedule today for chest pain.   Chest pain/ CAD: Coronary calcium noted on CT. ETT was normal in 04/2012. No prior cardiac history.She is currently having atypical throat and jaw pain. Her husband Dr. Enrigue Catena would like her to have an ischemic evaluation before she leaves for her ECT next week. She was not able to walk well on the treadmill last time and would like to proceed with lexiscan myoview.  -- Continue ASA 81 mg daily and Crestor 20 daily.   Carotid stenosis: Stable 40-59% bilateral ICA stenosis in 7/15. Repeat in 1 year.  Lung nodules:  noncontrast CT scan to follow her nodules done in 09/2013. Dr Aundra Dubin reported tiny nodules with no changes and did not feel they required further follow up.   Bradycardia: Mild, asymptomatic. Avoid nodal blocking agents.   HTN: BP is under good control. Continue amlodipine 36m and losartan 104m Bipolar 1 - on lithium. She is currently in a severe depressive swing and seen by her psychiatrist this AM. He has asked that we order a serum lithium level, CMP, TSH, CBC and UA today. I will have this arranged.  -- She will go for ECT treatment Thursday of next week.   Hyperlipidemia: LDL < 70 given carotid stenosis and CAD.  -- Continue statin .  Current medicines are reviewed at length with the patient today.  The patient does not have concerns regarding medicines.  The following changes have been made:  no change  Labs/ tests ordered today include:   Orders Placed  This Encounter  Procedures  . Comp Met (CMET)  . TSH  . CBC  . Lithium level  . Urinalysis  . Myocardial Perfusion Imaging  . EKG 12-Lead     Disposition:   FU with Dr. Aundra Dubin   Signed, Eileen Stanford, PA-C  01/16/2015 3:41 PM    [facility] [place], [place], [place]  [address] Phone: [phone]; Fax: [phone]

## 2015-01-16 NOTE — Patient Instructions (Signed)
Medication Instructions:  None  Labwork: Today (CMET, Lithium level, TSH, UA, CBC)  Testing/Procedures: Your physician has requested that you have a lexiscan myoview. For further information please visit [email or web address]. Please follow instruction sheet, as given.   Follow-Up: Your physician recommends that you schedule a follow-up appointment with Dr. Aundra Dubin first available.    Any Other Special Instructions Will Be Listed Below (If Applicable).

## 2015-01-16 NOTE — Telephone Encounter (Signed)
The pt's husband called the office because the pt has been experiencing chest pain for the past 3 weeks. The pt has felt this was related to her reflux.  Dr Inocente Salles would like the pt seen today for evaluation.  The pt has been added to the flex schedule at 2:30.

## 2015-01-17 LAB — LITHIUM LEVEL: Lithium Lvl: 0.8 mEq/L (ref 0.80–1.40)

## 2015-01-20 ENCOUNTER — Other Ambulatory Visit: Payer: Self-pay | Admitting: Physician Assistant

## 2015-01-20 ENCOUNTER — Encounter: Payer: Self-pay | Admitting: Cardiology

## 2015-01-20 ENCOUNTER — Encounter: Payer: Self-pay | Admitting: Cardiovascular Disease

## 2015-01-20 MED ORDER — POTASSIUM CHLORIDE ER 10 MEQ PO TBCR: 40.0000 meq | EXTENDED_RELEASE_TABLET | Freq: Every day | ORAL | Status: DC

## 2015-01-21 ENCOUNTER — Encounter: Payer: Self-pay | Admitting: Physician Assistant

## 2015-01-22 ENCOUNTER — Encounter (HOSPITAL_COMMUNITY): Payer: Medicare Other

## 2015-01-23 ENCOUNTER — Telehealth: Payer: Self-pay

## 2015-01-23 DIAGNOSIS — F332 Major depressive disorder, recurrent severe without psychotic features: Secondary | ICD-10-CM | POA: Diagnosis not present

## 2015-01-23 NOTE — Telephone Encounter (Signed)
Spoke with a Merchant navy officer from [facility] and technician put me on hold to find out who had called. Technician states that no one says they called and she wasn't sure why we were contacted.  Will route to [employer], PA-C.

## 2015-01-24 ENCOUNTER — Ambulatory Visit: Payer: Medicare Other | Admitting: Neurology

## 2015-01-29 ENCOUNTER — Ambulatory Visit (INDEPENDENT_AMBULATORY_CARE_PROVIDER_SITE_OTHER): Payer: Medicare Other | Admitting: Internal Medicine

## 2015-01-29 ENCOUNTER — Encounter (HOSPITAL_COMMUNITY): Payer: Medicare Other

## 2015-01-29 ENCOUNTER — Telehealth (HOSPITAL_COMMUNITY): Payer: Self-pay

## 2015-01-29 ENCOUNTER — Encounter: Payer: Self-pay | Admitting: Internal Medicine

## 2015-01-29 VITALS — BP 118/48 | HR 56 | Resp 20 | Ht 60.0 in | Wt 119.0 lb

## 2015-01-29 DIAGNOSIS — M810 Age-related osteoporosis without current pathological fracture: Secondary | ICD-10-CM | POA: Diagnosis not present

## 2015-01-29 DIAGNOSIS — E039 Hypothyroidism, unspecified: Secondary | ICD-10-CM | POA: Diagnosis not present

## 2015-01-29 DIAGNOSIS — R251 Tremor, unspecified: Secondary | ICD-10-CM

## 2015-01-29 DIAGNOSIS — E785 Hyperlipidemia, unspecified: Secondary | ICD-10-CM | POA: Diagnosis not present

## 2015-01-29 DIAGNOSIS — I10 Essential (primary) hypertension: Secondary | ICD-10-CM | POA: Diagnosis not present

## 2015-01-29 DIAGNOSIS — R5383 Other fatigue: Secondary | ICD-10-CM | POA: Diagnosis not present

## 2015-01-29 DIAGNOSIS — Z8669 Personal history of other diseases of the nervous system and sense organs: Secondary | ICD-10-CM

## 2015-01-29 DIAGNOSIS — I739 Peripheral vascular disease, unspecified: Secondary | ICD-10-CM

## 2015-01-29 DIAGNOSIS — Z0000 Encounter for general adult medical examination without abnormal findings: Secondary | ICD-10-CM

## 2015-01-29 DIAGNOSIS — Z79899 Other long term (current) drug therapy: Secondary | ICD-10-CM | POA: Diagnosis not present

## 2015-01-29 DIAGNOSIS — F310 Bipolar disorder, current episode hypomanic: Secondary | ICD-10-CM

## 2015-01-29 DIAGNOSIS — K219 Gastro-esophageal reflux disease without esophagitis: Secondary | ICD-10-CM

## 2015-01-29 DIAGNOSIS — Z9882 Breast implant status: Secondary | ICD-10-CM | POA: Diagnosis not present

## 2015-01-29 DIAGNOSIS — I779 Disorder of arteries and arterioles, unspecified: Secondary | ICD-10-CM | POA: Diagnosis not present

## 2015-01-29 DIAGNOSIS — K5900 Constipation, unspecified: Secondary | ICD-10-CM | POA: Diagnosis not present

## 2015-01-29 DIAGNOSIS — I2510 Atherosclerotic heart disease of native coronary artery without angina pectoris: Secondary | ICD-10-CM | POA: Diagnosis not present

## 2015-01-29 DIAGNOSIS — K5909 Other constipation: Secondary | ICD-10-CM

## 2015-01-29 DIAGNOSIS — Z87898 Personal history of other specified conditions: Secondary | ICD-10-CM

## 2015-01-29 LAB — CBC WITH DIFFERENTIAL/PLATELET
BASOS ABS: 0 10*3/uL (ref 0.0–0.1)
BASOS PCT: 0 % (ref 0–1)
EOS ABS: 0.2 10*3/uL (ref 0.0–0.7)
EOS PCT: 2 % (ref 0–5)
HCT: 37 % (ref 36.0–46.0)
HEMOGLOBIN: 12.2 g/dL (ref 12.0–15.0)
Lymphocytes Relative: 20 % (ref 12–46)
Lymphs Abs: 1.6 10*3/uL (ref 0.7–4.0)
MCH: 31.4 pg (ref 26.0–34.0)
MCHC: 33 g/dL (ref 30.0–36.0)
MCV: 95.1 fL (ref 78.0–100.0)
MONO ABS: 0.4 10*3/uL (ref 0.1–1.0)
MONOS PCT: 5 % (ref 3–12)
MPV: 9.7 fL (ref 8.6–12.4)
Neutro Abs: 6 10*3/uL (ref 1.7–7.7)
Neutrophils Relative %: 73 % (ref 43–77)
PLATELETS: 291 10*3/uL (ref 150–400)
RBC: 3.89 MIL/uL (ref 3.87–5.11)
RDW: 13.4 % (ref 11.5–15.5)
WBC: 8.2 10*3/uL (ref 4.0–10.5)

## 2015-01-29 LAB — COMPLETE METABOLIC PANEL WITH GFR
ALK PHOS: 39 U/L (ref 39–117)
ALT: 28 U/L (ref 0–35)
AST: 18 U/L (ref 0–37)
Albumin: 4 g/dL (ref 3.5–5.2)
BUN: 22 mg/dL (ref 6–23)
CALCIUM: 9.6 mg/dL (ref 8.4–10.5)
CHLORIDE: 101 meq/L (ref 96–112)
CO2: 24 mEq/L (ref 19–32)
CREATININE: 0.76 mg/dL (ref 0.50–1.10)
GFR, Est African American: 89 mL/min
GFR, Est Non African American: 77 mL/min
Glucose, Bld: 161 mg/dL — ABNORMAL HIGH (ref 70–99)
POTASSIUM: 3.3 meq/L — AB (ref 3.5–5.3)
Sodium: 138 mEq/L (ref 135–145)
Total Bilirubin: 0.3 mg/dL (ref 0.2–1.2)
Total Protein: 6.6 g/dL (ref 6.0–8.3)

## 2015-01-29 LAB — POCT URINALYSIS DIPSTICK
BILIRUBIN UA: NEGATIVE
GLUCOSE UA: NEGATIVE
KETONES UA: NEGATIVE
Leukocytes, UA: NEGATIVE
Nitrite, UA: NEGATIVE
Protein, UA: NEGATIVE
RBC UA: NEGATIVE
SPEC GRAV UA: 1.01
Urobilinogen, UA: NEGATIVE
pH, UA: 6

## 2015-01-29 LAB — TSH: TSH: 2.263 u[IU]/mL (ref 0.350–4.500)

## 2015-01-29 NOTE — Telephone Encounter (Signed)
Closed Encounter  °

## 2015-01-29 NOTE — Progress Notes (Signed)
Subjective:    Patient ID: Meghan Oliver, female    DOB: 04-Aug-1941, 74 y.o.   MRN: [id number]  HPI   74 year old White Female in today for health maintenance exam and evaluation of medical issues. She has a history of bipolar disorder. Has become depressed and agitated recently. Is scheduled to have ECT this coming Friday, April 29 at [facility]. Her Cardiologist is doing a nuclear medicine stress test tomorrow. Lab work drawn and pending today. She has had ECT twice previously. Each time she has had considerable success with it. Apparently symptoms started this time while she and her husband were spending the winter in [place]. Husband reports that she sleeps about 8 hours with lorazepam. Otherwise is very anxious and has to be reassured.  Appetite is decreased. Patient complains of feeling alternating cold and hot.  Long-standing history of depression starting in her 49s after the birth of her first child.  History of bilateral breast implants.  History of hypothyroidism. History of carotid artery disease. Had Doppler study done December 2010 showing a 40-59% stenosis in both internal carotid arteries. History of obstructive sleep apnea diagnosed by Dr. Gwenette Greet.  History of appendectomy 1971, bilateral tubal ligation, left ovarian cystectomy.  According to neurologist, Dr. Carles Collet she has had tremor for several years, worse in the right hand than the left. Has been treated with Topamax and Inderal in the past for tremor. She is on lithium, Zyprexa, lorazepam. Had MRI of the brain July 2015 showing chronic microvascular ischemic changes.  She has a history of hypertension. History of hyperlipidemia. History of lumbar disc disease and complains of some pain in her right buttock. History of functional constipation. Colonoscopy done in 2012. Sometimes has nocturia. History of GE reflux treated with twice-daily omeprazole.  History of hematuria which is been evaluated by Dr.  Elyse Jarvis in the past. Urinalysis today is normal.  History of transmitted murmur into her carotids. History of osteoporosis. Tetanus immunization up-to-date. Has had Zostavax vaccine. Has had pneumococcal 23 vaccine and Prevnar.  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 She does not smoke. Quit smoking in 1972. 2 adult daughters. Occasionally has a glass of wine.  [name] is her psychiatrist. Dr. Carles Collet is her Neurologist. She has a history of right upper extremity tremor. Her lithium dose was decreased recently to see if it would help tremor.  Family history: Father with history of bipolar disorder died at age 12 of an MI. Mother died with history of COPD. One brother.  In May 2015 she weighed 130 pounds and now weighs 119 pounds. History of left lower lobe pneumonia 2015.    Review of Systems  HENT: Negative.   Respiratory: Negative.   Cardiovascular: Negative.   Gastrointestinal: Negative.   Endocrine:       History of hypothyroidism  Neurological:       History of right hand tremor  Psychiatric/Behavioral:       Agitation and decreased appetite       Objective:   Physical Exam  Constitutional: She appears well-developed and well-nourished.  HENT:  Head: Normocephalic and atraumatic.  Right Ear: External ear normal.  Left Ear: External ear normal.  Mouth/Throat: Oropharynx is clear and moist. No oropharyngeal exudate.  Eyes: Conjunctivae and EOM are normal. Pupils are equal, round, and reactive to light. Right eye exhibits no discharge. Left eye exhibits no discharge. No scleral icterus.  Neck: Neck supple. No JVD present. No thyromegaly present.  Cardiovascular: Regular rhythm and intact distal pulses.   Murmur heard. Bradycardia. Murmur transmitted to carotids bilaterally  Pulmonary/Chest: Effort normal and breath sounds normal. No respiratory distress. She has no wheezes. She has no rales. She exhibits no tenderness.  Breasts bilateral  implants without masses  Abdominal: Soft. Bowel sounds are normal. She exhibits no distension and no mass. There is no tenderness. There is no rebound and no guarding.  Genitourinary:  Deferred to [facility]necology  Musculoskeletal: Normal range of motion. She exhibits no edema.  Lymphadenopathy:    She has no cervical adenopathy.  Neurological: She is alert. She has normal reflexes. No cranial nerve deficit. Coordination normal.  Skin: Skin is warm and dry. No rash noted. She is not diaphoretic.  Psychiatric:  Very agitated. Complaining of being alternating hot and then cold.  Vitals reviewed.         Assessment & Plan:  Bipolar disorder with acute exacerbation associated with agitation. To have ECT April 29.  Hypothyroidism  History of hypertension  History of bilateral carotid disease  Hyperlipidemia  History of obstructive sleep apnea  History of GE reflux  History of bilateral hand tremors  History of chronic constipation  History of lumbar disc disease  History of osteoporosis  Plan: Medical clearance for ECT therapy. Labs are drawn and are pending. Urinalysis is within normal limits. Is to see cardiologist tomorrow regarding nuclear medicine study.  Subjective:   Patient presents for Medicare Annual/Subsequent preventive examination.  Review Past Medical/Family/Social: See above   Risk Factors  Current exercise habits: Sedentary Dietary issues discussed: Did not discuss today  Cardiac risk factors: Family history, hypertension, hyperlipidemia, carotid artery disease  Depression Screen  (Note: if answer to either of the following is "Yes", a more complete depression screening is indicated)   Over the past two weeks, have you felt down, depressed or hopeless? Yes Over the past two weeks, have you felt little interest or pleasure in doing things? Yes Have you lost interest or pleasure in daily life? Yes Do you often feel hopeless? Yes Do you cry  easily over simple problems? Yes  Activities of Daily Living  In your present state of health, do you have any difficulty performing the following activities?: Normally these would not be an issue. Patient is very agitated due to exacerbation of bipolar disorder with hypomania  Driving? No  Managing money? No  Feeding yourself? No  Getting from bed to chair? No  Climbing a flight of stairs? No  Preparing food and eating?: No  Bathing or showering? No  Getting dressed: No  Getting to the toilet? No  Using the toilet:No  Moving around from place to place: No  In the past year have you fallen or had a near fall?:No  Are you sexually active? Not asked Do you have more than one partner? No   Hearing Difficulties: No  Do you often ask people to speak up or repeat themselves? No  Do you experience ringing or noises in your ears? No  Do you have difficulty understanding soft or whispered voices? No  Do you feel that you have a problem with memory? No Do you often misplace items? No    Home Safety:  Do you have a smoke alarm at your residence? Yes Do you have grab bars in the bathroom? Do you have throw rugs in your house?   Cognitive Testing  Alert? Yes Normal Appearance?Yes  Oriented to person? Yes Place? Yes  Time not asked Recall  of three objects? Too agitated to assess Can perform simple calculations? Too agitated to a see [name] Displays appropriate judgment? Agitated-not assessed Can read the correct time from a watch face?Yes   List the Names of Other Physician/Practitioners you currently use:  See referral list for the physicians patient is currently seeing.  [name], Dr. Carles Collet, Dr. Olevia Perches, Dr. Aundra Dubin, Dr. Gwenette Greet,  gynecologist at [facility] gynecology   Review of Systems: See above   Objective:     General appearance: Looks fatigued and is agitated Head: Normocephalic, without obvious abnormality, atraumatic  Eyes: conj clear, EOMi PEERLA  Ears: normal TM's and  external ear canals both ears  Nose: Nares normal. Septum midline. Mucosa normal. No drainage or sinus tenderness.  Throat: lips, mucosa, and tongue normal; teeth and gums normal  Neck: no adenopathy, no carotid bruit, no JVD, supple, symmetrical, trachea midline and thyroid not enlarged, symmetric, no tenderness/mass/nodules  No CVA tenderness.  Lungs: clear to auscultation bilaterally  Breasts: normal appearance, no masses or tenderness. Bilateral breast implants Heart: regular rate and rhythm, S1, S2 normal, no murmur, click, rub or gallop  Abdomen: soft, non-tender; bowel sounds normal; no masses, no organomegaly  Musculoskeletal: ROM normal in all joints, no crepitus, no deformity, Normal muscle strengthen. Back  is symmetric, no curvature. Skin: Skin color, texture, turgor normal. No rashes or lesions  Lymph nodes: Cervical, supraclavicular, and axillary nodes normal.  Neurologic: CN 2 -12 Normal, Normal symmetric reflexes. Normal coordination and gait  Psych: Alert & Oriented x 3, Mood appear stable.    Assessment:    Annual wellness medicare exam   Plan:    During the course of the visit the patient was educated and counseled about appropriate screening and preventive services including:   Annual mammogram  Bone density study every 2-3 years  Needs Prevnar. Colonoscopy up-to-date.   Patient Instructions (the written plan) was given to the patient.  Medicare Attestation  I have personally reviewed:  The patient's medical and social history  Their use of alcohol, tobacco or illicit drugs  Their current medications and supplements  The patient's functional ability including ADLs,fall risks, home safety risks, cognitive, and hearing and visual impairment  Diet and physical activities  Evidence for depression or mood disorders  The patient's weight, height, BMI, and visual acuity have been recorded in the chart. I have made referrals, counseling, and provided education to  the patient based on review of the above and I have provided the patient with a written personalized care plan for preventive services.

## 2015-01-30 ENCOUNTER — Ambulatory Visit (HOSPITAL_COMMUNITY): Payer: Medicare Other | Attending: Physician Assistant | Admitting: Radiology

## 2015-01-30 DIAGNOSIS — R079 Chest pain, unspecified: Secondary | ICD-10-CM | POA: Diagnosis not present

## 2015-01-30 DIAGNOSIS — I10 Essential (primary) hypertension: Secondary | ICD-10-CM | POA: Diagnosis not present

## 2015-01-30 MED ORDER — TECHNETIUM TC 99M SESTAMIBI GENERIC - CARDIOLITE
11.0000 | Freq: Once | INTRAVENOUS | Status: AC | PRN
  Administered 2015-01-30: 11 via INTRAVENOUS

## 2015-01-30 MED ORDER — REGADENOSON 0.4 MG/5ML IV SOLN
0.4000 mg | Freq: Once | INTRAVENOUS | Status: AC
  Administered 2015-01-30: 0.4 mg via INTRAVENOUS

## 2015-01-30 MED ORDER — TECHNETIUM TC 99M SESTAMIBI GENERIC - CARDIOLITE
33.0000 | Freq: Once | INTRAVENOUS | Status: AC | PRN
  Administered 2015-01-30: 33 via INTRAVENOUS

## 2015-01-30 NOTE — Progress Notes (Addendum)
[facility] [address] [place], [place] [address] [phone]    Cardiology Nuclear Med Study  Meghan Oliver is a 74 y.o. female     MRN : [id number]     DOB: 07/15/1941  Procedure Date: 01/30/2015  Nuclear Med Background Indication for Stress Test:  Evaluation for Ischemia and ECT at [facility] on 01-31-2015 History:  CAD on CT Cardiac Risk Factors: Carotid Disease and Hypertension  Symptoms:  Chest Pain   Nuclear Pre-Procedure Caffeine/Decaff Intake:  None> 12 hrs NPO After: 8:00pm   Lungs:  clear O2 Sat: 94% on room air. IV 0.9% NS with Angio Cath:  22g  IV Site: R Antecubital x 1, tolerated well IV Started by:  Irven Baltimore, RN  Chest Size (in):  36 Cup Size: C  Height: 5' (1.524 m)  Weight:  120 lb (54.432 kg)  BMI:  Body mass index is 23.44 kg/(m^2). Tech Comments:  Patient took Ativan at [place] this am. Irven Baltimore, RN    Nuclear Med Study 1 or 2 day study: 1 day  Stress Test Type:  Carlton Adam  Reading MD: N/A  Order Authorizing Provider:  Angelena Form, PAC, and Loralie Champagne, MD  Resting Radionuclide: Technetium 27m Sestamibi  Resting Radionuclide Dose: 11.0 mCi   Stress Radionuclide:  Technetium 69m Sestamibi  Stress Radionuclide Dose: 33.0 mCi           Stress Protocol Rest HR: 57 Stress HR: 77  Rest BP: 133/67 Stress BP: 147/68  Exercise Time (min): n/a METS: n/a   Predicted Max HR: 145 bpm % Max HR: 53.1 bpm Rate Pressure Product: 11319   Dose of Adenosine (mg):  n/a Dose of Lexiscan: 0.4 mg  Dose of Atropine (mg): n/a Dose of Dobutamine: n/a mcg/kg/min (at max HR)  Stress Test Technologist: Perrin Maltese, EMT-P  Nuclear Technologist:  Earl Many, CNMT     Rest Procedure:  Myocardial perfusion imaging was performed at rest 45 minutes following the intravenous administration of Technetium 71m Sestamibi. Rest ECG: Sinus bradycardia with nonspecific ST abnormality in the anterior precordial leads and  anterior infarct.  Stress Procedure:  The patient received IV Lexiscan 0.4 mg over 15-seconds.  Technetium 76m Sestamibi injected at 30-seconds. The patient had sob and felt weird with the Lexiscan injection.  Quantitative spect images were obtained after a 45 minute delay. Stress ECG: No significant change from baseline ECG  QPS Raw Data Images:  There is a breast  attenuation. Stress Images:  Normal homogeneous uptake in all areas of the myocardium. Rest Images:  Normal homogeneous uptake in all areas of the myocardium. Subtraction (SDS):  No evidence of ischemia. Transient Ischemic Dilatation (Normal <1.22):  1.04 Lung/Heart Ratio (Normal <0.45):  0.30   Quantitative Gated Spect Images QGS EDV:  76 ml QGS ESV:  18 ml  Impression Exercise Capacity:  Lexiscan with no exercise. BP Response:  Normal blood pressure response. Clinical Symptoms:  There is dyspnea. ECG Impression:  No change in anterior ST abnormality from baseline EKG. Comparison with Prior Nuclear Study: No images to compare  Overall Impression:  Low risk stress nuclear study with normal myocardial perfusion.  There are baseline T wave inversions in the anterior leads with no change during lexiscan infusion..  LV Ejection Fraction: 76%.  LV Wall Motion:  NL LV Function; NL Wall Motion  Signed: Fransico Him, mD [facility] 01/30/2015  No evidence for ischemia or infarction, please inform patient.  [facility] [employer]  01/31/2015 9:48 AM

## 2015-01-31 ENCOUNTER — Encounter: Payer: Self-pay | Admitting: Physician Assistant

## 2015-01-31 ENCOUNTER — Telehealth: Payer: Self-pay | Admitting: *Deleted

## 2015-01-31 DIAGNOSIS — Z87891 Personal history of nicotine dependence: Secondary | ICD-10-CM | POA: Diagnosis not present

## 2015-01-31 DIAGNOSIS — E039 Hypothyroidism, unspecified: Secondary | ICD-10-CM | POA: Diagnosis not present

## 2015-01-31 DIAGNOSIS — F332 Major depressive disorder, recurrent severe without psychotic features: Secondary | ICD-10-CM | POA: Diagnosis not present

## 2015-01-31 DIAGNOSIS — M810 Age-related osteoporosis without current pathological fracture: Secondary | ICD-10-CM | POA: Diagnosis not present

## 2015-01-31 DIAGNOSIS — E785 Hyperlipidemia, unspecified: Secondary | ICD-10-CM | POA: Diagnosis not present

## 2015-01-31 DIAGNOSIS — K219 Gastro-esophageal reflux disease without esophagitis: Secondary | ICD-10-CM | POA: Diagnosis not present

## 2015-01-31 DIAGNOSIS — I10 Essential (primary) hypertension: Secondary | ICD-10-CM | POA: Diagnosis not present

## 2015-01-31 NOTE — Telephone Encounter (Signed)
No answer on home #. LM on VM of cell phone #. LMTCB 4/29 @11 :40a.   Needing to inform patient on Cardiology Nuclear Med Study and that copy of study results was mailed to her home too.   Overall Impression: Low risk stress nuclear study with normal myocardial perfusion. There are baseline T wave inversions in the anterior leads with no change during lexiscan infusion. LV Ejection Fraction: 76%. LV Wall Motion: NL LV Function; NL Wall Motion. Signed: Fransico Him, MD

## 2015-01-31 NOTE — Telephone Encounter (Signed)
[name] notified

## 2015-02-03 ENCOUNTER — Ambulatory Visit: Payer: Medicare Other | Admitting: Internal Medicine

## 2015-02-04 DIAGNOSIS — K219 Gastro-esophageal reflux disease without esophagitis: Secondary | ICD-10-CM | POA: Diagnosis not present

## 2015-02-04 DIAGNOSIS — Z7982 Long term (current) use of aspirin: Secondary | ICD-10-CM | POA: Diagnosis not present

## 2015-02-04 DIAGNOSIS — M810 Age-related osteoporosis without current pathological fracture: Secondary | ICD-10-CM | POA: Diagnosis not present

## 2015-02-04 DIAGNOSIS — E039 Hypothyroidism, unspecified: Secondary | ICD-10-CM | POA: Diagnosis not present

## 2015-02-04 DIAGNOSIS — E785 Hyperlipidemia, unspecified: Secondary | ICD-10-CM | POA: Diagnosis not present

## 2015-02-04 DIAGNOSIS — I6523 Occlusion and stenosis of bilateral carotid arteries: Secondary | ICD-10-CM | POA: Diagnosis not present

## 2015-02-04 DIAGNOSIS — Z87891 Personal history of nicotine dependence: Secondary | ICD-10-CM | POA: Diagnosis not present

## 2015-02-04 DIAGNOSIS — N3281 Overactive bladder: Secondary | ICD-10-CM | POA: Diagnosis not present

## 2015-02-04 DIAGNOSIS — I10 Essential (primary) hypertension: Secondary | ICD-10-CM | POA: Diagnosis not present

## 2015-02-04 DIAGNOSIS — F332 Major depressive disorder, recurrent severe without psychotic features: Secondary | ICD-10-CM | POA: Diagnosis not present

## 2015-02-07 DIAGNOSIS — I10 Essential (primary) hypertension: Secondary | ICD-10-CM | POA: Diagnosis not present

## 2015-02-07 DIAGNOSIS — N3281 Overactive bladder: Secondary | ICD-10-CM | POA: Diagnosis not present

## 2015-02-07 DIAGNOSIS — K219 Gastro-esophageal reflux disease without esophagitis: Secondary | ICD-10-CM | POA: Diagnosis not present

## 2015-02-07 DIAGNOSIS — I6523 Occlusion and stenosis of bilateral carotid arteries: Secondary | ICD-10-CM | POA: Diagnosis not present

## 2015-02-07 DIAGNOSIS — Z79899 Other long term (current) drug therapy: Secondary | ICD-10-CM | POA: Diagnosis not present

## 2015-02-07 DIAGNOSIS — M810 Age-related osteoporosis without current pathological fracture: Secondary | ICD-10-CM | POA: Diagnosis not present

## 2015-02-07 DIAGNOSIS — E039 Hypothyroidism, unspecified: Secondary | ICD-10-CM | POA: Diagnosis not present

## 2015-02-07 DIAGNOSIS — Z87891 Personal history of nicotine dependence: Secondary | ICD-10-CM | POA: Diagnosis not present

## 2015-02-07 DIAGNOSIS — F332 Major depressive disorder, recurrent severe without psychotic features: Secondary | ICD-10-CM | POA: Diagnosis not present

## 2015-02-07 DIAGNOSIS — M1990 Unspecified osteoarthritis, unspecified site: Secondary | ICD-10-CM | POA: Diagnosis not present

## 2015-02-07 DIAGNOSIS — E785 Hyperlipidemia, unspecified: Secondary | ICD-10-CM | POA: Diagnosis not present

## 2015-02-07 DIAGNOSIS — Z7982 Long term (current) use of aspirin: Secondary | ICD-10-CM | POA: Diagnosis not present

## 2015-02-11 DIAGNOSIS — Z87891 Personal history of nicotine dependence: Secondary | ICD-10-CM | POA: Diagnosis not present

## 2015-02-11 DIAGNOSIS — K219 Gastro-esophageal reflux disease without esophagitis: Secondary | ICD-10-CM | POA: Diagnosis not present

## 2015-02-11 DIAGNOSIS — E039 Hypothyroidism, unspecified: Secondary | ICD-10-CM | POA: Diagnosis not present

## 2015-02-11 DIAGNOSIS — M1990 Unspecified osteoarthritis, unspecified site: Secondary | ICD-10-CM | POA: Diagnosis not present

## 2015-02-11 DIAGNOSIS — F332 Major depressive disorder, recurrent severe without psychotic features: Secondary | ICD-10-CM | POA: Diagnosis not present

## 2015-02-11 DIAGNOSIS — I10 Essential (primary) hypertension: Secondary | ICD-10-CM | POA: Diagnosis not present

## 2015-02-11 DIAGNOSIS — E785 Hyperlipidemia, unspecified: Secondary | ICD-10-CM | POA: Diagnosis not present

## 2015-02-11 DIAGNOSIS — M810 Age-related osteoporosis without current pathological fracture: Secondary | ICD-10-CM | POA: Diagnosis not present

## 2015-02-11 DIAGNOSIS — F328 Other depressive episodes: Secondary | ICD-10-CM | POA: Diagnosis not present

## 2015-02-11 DIAGNOSIS — Z7982 Long term (current) use of aspirin: Secondary | ICD-10-CM | POA: Diagnosis not present

## 2015-02-14 DIAGNOSIS — Z79899 Other long term (current) drug therapy: Secondary | ICD-10-CM | POA: Diagnosis not present

## 2015-02-14 DIAGNOSIS — Z7982 Long term (current) use of aspirin: Secondary | ICD-10-CM | POA: Diagnosis not present

## 2015-02-14 DIAGNOSIS — I6523 Occlusion and stenosis of bilateral carotid arteries: Secondary | ICD-10-CM | POA: Diagnosis not present

## 2015-02-14 DIAGNOSIS — R251 Tremor, unspecified: Secondary | ICD-10-CM | POA: Diagnosis not present

## 2015-02-14 DIAGNOSIS — E785 Hyperlipidemia, unspecified: Secondary | ICD-10-CM | POA: Diagnosis not present

## 2015-02-14 DIAGNOSIS — M810 Age-related osteoporosis without current pathological fracture: Secondary | ICD-10-CM | POA: Diagnosis not present

## 2015-02-14 DIAGNOSIS — K219 Gastro-esophageal reflux disease without esophagitis: Secondary | ICD-10-CM | POA: Diagnosis not present

## 2015-02-14 DIAGNOSIS — Z87891 Personal history of nicotine dependence: Secondary | ICD-10-CM | POA: Diagnosis not present

## 2015-02-14 DIAGNOSIS — N3281 Overactive bladder: Secondary | ICD-10-CM | POA: Diagnosis not present

## 2015-02-14 DIAGNOSIS — E039 Hypothyroidism, unspecified: Secondary | ICD-10-CM | POA: Diagnosis not present

## 2015-02-14 DIAGNOSIS — M1990 Unspecified osteoarthritis, unspecified site: Secondary | ICD-10-CM | POA: Diagnosis not present

## 2015-02-14 DIAGNOSIS — F332 Major depressive disorder, recurrent severe without psychotic features: Secondary | ICD-10-CM | POA: Diagnosis not present

## 2015-02-14 DIAGNOSIS — Z888 Allergy status to other drugs, medicaments and biological substances status: Secondary | ICD-10-CM | POA: Diagnosis not present

## 2015-02-14 DIAGNOSIS — I10 Essential (primary) hypertension: Secondary | ICD-10-CM | POA: Diagnosis not present

## 2015-02-18 DIAGNOSIS — M1990 Unspecified osteoarthritis, unspecified site: Secondary | ICD-10-CM | POA: Diagnosis not present

## 2015-02-18 DIAGNOSIS — Z7982 Long term (current) use of aspirin: Secondary | ICD-10-CM | POA: Diagnosis not present

## 2015-02-18 DIAGNOSIS — F332 Major depressive disorder, recurrent severe without psychotic features: Secondary | ICD-10-CM | POA: Diagnosis not present

## 2015-02-18 DIAGNOSIS — M810 Age-related osteoporosis without current pathological fracture: Secondary | ICD-10-CM | POA: Diagnosis not present

## 2015-02-18 DIAGNOSIS — Z79899 Other long term (current) drug therapy: Secondary | ICD-10-CM | POA: Diagnosis not present

## 2015-02-18 DIAGNOSIS — I10 Essential (primary) hypertension: Secondary | ICD-10-CM | POA: Diagnosis not present

## 2015-02-18 DIAGNOSIS — E039 Hypothyroidism, unspecified: Secondary | ICD-10-CM | POA: Diagnosis not present

## 2015-02-18 DIAGNOSIS — Z7902 Long term (current) use of antithrombotics/antiplatelets: Secondary | ICD-10-CM | POA: Diagnosis not present

## 2015-02-18 DIAGNOSIS — E785 Hyperlipidemia, unspecified: Secondary | ICD-10-CM | POA: Diagnosis not present

## 2015-02-18 DIAGNOSIS — I6523 Occlusion and stenosis of bilateral carotid arteries: Secondary | ICD-10-CM | POA: Diagnosis not present

## 2015-02-18 DIAGNOSIS — Z87891 Personal history of nicotine dependence: Secondary | ICD-10-CM | POA: Diagnosis not present

## 2015-02-18 DIAGNOSIS — K219 Gastro-esophageal reflux disease without esophagitis: Secondary | ICD-10-CM | POA: Diagnosis not present

## 2015-02-21 ENCOUNTER — Encounter: Payer: Self-pay | Admitting: Internal Medicine

## 2015-02-21 DIAGNOSIS — E785 Hyperlipidemia, unspecified: Secondary | ICD-10-CM | POA: Diagnosis not present

## 2015-02-21 DIAGNOSIS — F332 Major depressive disorder, recurrent severe without psychotic features: Secondary | ICD-10-CM | POA: Diagnosis not present

## 2015-02-21 DIAGNOSIS — Z7982 Long term (current) use of aspirin: Secondary | ICD-10-CM | POA: Diagnosis not present

## 2015-02-21 DIAGNOSIS — E039 Hypothyroidism, unspecified: Secondary | ICD-10-CM | POA: Diagnosis not present

## 2015-02-21 DIAGNOSIS — M810 Age-related osteoporosis without current pathological fracture: Secondary | ICD-10-CM | POA: Diagnosis not present

## 2015-02-21 DIAGNOSIS — Z87891 Personal history of nicotine dependence: Secondary | ICD-10-CM | POA: Diagnosis not present

## 2015-02-21 DIAGNOSIS — K219 Gastro-esophageal reflux disease without esophagitis: Secondary | ICD-10-CM | POA: Diagnosis not present

## 2015-02-21 DIAGNOSIS — I10 Essential (primary) hypertension: Secondary | ICD-10-CM | POA: Diagnosis not present

## 2015-02-21 DIAGNOSIS — M1990 Unspecified osteoarthritis, unspecified site: Secondary | ICD-10-CM | POA: Diagnosis not present

## 2015-02-24 DIAGNOSIS — F3341 Major depressive disorder, recurrent, in partial remission: Secondary | ICD-10-CM | POA: Diagnosis not present

## 2015-02-25 DIAGNOSIS — Z87891 Personal history of nicotine dependence: Secondary | ICD-10-CM | POA: Diagnosis not present

## 2015-02-25 DIAGNOSIS — K219 Gastro-esophageal reflux disease without esophagitis: Secondary | ICD-10-CM | POA: Diagnosis not present

## 2015-02-25 DIAGNOSIS — I10 Essential (primary) hypertension: Secondary | ICD-10-CM | POA: Diagnosis not present

## 2015-02-25 DIAGNOSIS — F332 Major depressive disorder, recurrent severe without psychotic features: Secondary | ICD-10-CM | POA: Diagnosis not present

## 2015-02-25 DIAGNOSIS — M1990 Unspecified osteoarthritis, unspecified site: Secondary | ICD-10-CM | POA: Diagnosis not present

## 2015-02-25 DIAGNOSIS — E785 Hyperlipidemia, unspecified: Secondary | ICD-10-CM | POA: Diagnosis not present

## 2015-02-25 DIAGNOSIS — E039 Hypothyroidism, unspecified: Secondary | ICD-10-CM | POA: Diagnosis not present

## 2015-02-25 DIAGNOSIS — M810 Age-related osteoporosis without current pathological fracture: Secondary | ICD-10-CM | POA: Diagnosis not present

## 2015-02-26 DIAGNOSIS — F3341 Major depressive disorder, recurrent, in partial remission: Secondary | ICD-10-CM | POA: Diagnosis not present

## 2015-02-28 DIAGNOSIS — Z79899 Other long term (current) drug therapy: Secondary | ICD-10-CM | POA: Diagnosis not present

## 2015-02-28 DIAGNOSIS — F3181 Bipolar II disorder: Secondary | ICD-10-CM | POA: Diagnosis not present

## 2015-02-28 DIAGNOSIS — Z7982 Long term (current) use of aspirin: Secondary | ICD-10-CM | POA: Diagnosis not present

## 2015-02-28 DIAGNOSIS — F332 Major depressive disorder, recurrent severe without psychotic features: Secondary | ICD-10-CM | POA: Diagnosis not present

## 2015-02-28 DIAGNOSIS — E039 Hypothyroidism, unspecified: Secondary | ICD-10-CM | POA: Diagnosis not present

## 2015-02-28 DIAGNOSIS — I10 Essential (primary) hypertension: Secondary | ICD-10-CM | POA: Diagnosis not present

## 2015-02-28 DIAGNOSIS — E785 Hyperlipidemia, unspecified: Secondary | ICD-10-CM | POA: Diagnosis not present

## 2015-03-05 DIAGNOSIS — F332 Major depressive disorder, recurrent severe without psychotic features: Secondary | ICD-10-CM | POA: Diagnosis not present

## 2015-03-07 DIAGNOSIS — Z87891 Personal history of nicotine dependence: Secondary | ICD-10-CM | POA: Diagnosis not present

## 2015-03-07 DIAGNOSIS — I6523 Occlusion and stenosis of bilateral carotid arteries: Secondary | ICD-10-CM | POA: Diagnosis not present

## 2015-03-07 DIAGNOSIS — I10 Essential (primary) hypertension: Secondary | ICD-10-CM | POA: Diagnosis not present

## 2015-03-07 DIAGNOSIS — F332 Major depressive disorder, recurrent severe without psychotic features: Secondary | ICD-10-CM | POA: Diagnosis not present

## 2015-03-07 DIAGNOSIS — E785 Hyperlipidemia, unspecified: Secondary | ICD-10-CM | POA: Diagnosis not present

## 2015-03-07 DIAGNOSIS — Z7982 Long term (current) use of aspirin: Secondary | ICD-10-CM | POA: Diagnosis not present

## 2015-03-07 DIAGNOSIS — K219 Gastro-esophageal reflux disease without esophagitis: Secondary | ICD-10-CM | POA: Diagnosis not present

## 2015-03-07 DIAGNOSIS — E039 Hypothyroidism, unspecified: Secondary | ICD-10-CM | POA: Diagnosis not present

## 2015-03-07 DIAGNOSIS — M810 Age-related osteoporosis without current pathological fracture: Secondary | ICD-10-CM | POA: Diagnosis not present

## 2015-03-07 DIAGNOSIS — Z888 Allergy status to other drugs, medicaments and biological substances status: Secondary | ICD-10-CM | POA: Diagnosis not present

## 2015-03-07 DIAGNOSIS — Z79899 Other long term (current) drug therapy: Secondary | ICD-10-CM | POA: Diagnosis not present

## 2015-03-07 DIAGNOSIS — M1990 Unspecified osteoarthritis, unspecified site: Secondary | ICD-10-CM | POA: Diagnosis not present

## 2015-03-07 DIAGNOSIS — N3281 Overactive bladder: Secondary | ICD-10-CM | POA: Diagnosis not present

## 2015-03-10 DIAGNOSIS — F339 Major depressive disorder, recurrent, unspecified: Secondary | ICD-10-CM | POA: Diagnosis not present

## 2015-03-10 DIAGNOSIS — Z7982 Long term (current) use of aspirin: Secondary | ICD-10-CM | POA: Diagnosis not present

## 2015-03-10 DIAGNOSIS — E039 Hypothyroidism, unspecified: Secondary | ICD-10-CM | POA: Diagnosis not present

## 2015-03-10 DIAGNOSIS — K219 Gastro-esophageal reflux disease without esophagitis: Secondary | ICD-10-CM | POA: Diagnosis not present

## 2015-03-10 DIAGNOSIS — F332 Major depressive disorder, recurrent severe without psychotic features: Secondary | ICD-10-CM | POA: Diagnosis not present

## 2015-03-10 DIAGNOSIS — M1990 Unspecified osteoarthritis, unspecified site: Secondary | ICD-10-CM | POA: Diagnosis not present

## 2015-03-10 DIAGNOSIS — I10 Essential (primary) hypertension: Secondary | ICD-10-CM | POA: Diagnosis not present

## 2015-03-10 DIAGNOSIS — M810 Age-related osteoporosis without current pathological fracture: Secondary | ICD-10-CM | POA: Diagnosis not present

## 2015-03-20 DIAGNOSIS — F3341 Major depressive disorder, recurrent, in partial remission: Secondary | ICD-10-CM | POA: Diagnosis not present

## 2015-03-21 ENCOUNTER — Ambulatory Visit (INDEPENDENT_AMBULATORY_CARE_PROVIDER_SITE_OTHER): Payer: Medicare Other | Admitting: Women's Health

## 2015-03-21 VITALS — BP 140/76

## 2015-03-21 DIAGNOSIS — E039 Hypothyroidism, unspecified: Secondary | ICD-10-CM | POA: Diagnosis not present

## 2015-03-21 DIAGNOSIS — M810 Age-related osteoporosis without current pathological fracture: Secondary | ICD-10-CM | POA: Diagnosis not present

## 2015-03-21 DIAGNOSIS — K219 Gastro-esophageal reflux disease without esophagitis: Secondary | ICD-10-CM | POA: Diagnosis not present

## 2015-03-21 DIAGNOSIS — I10 Essential (primary) hypertension: Secondary | ICD-10-CM | POA: Diagnosis not present

## 2015-03-21 DIAGNOSIS — F332 Major depressive disorder, recurrent severe without psychotic features: Secondary | ICD-10-CM | POA: Diagnosis not present

## 2015-03-21 DIAGNOSIS — I2510 Atherosclerotic heart disease of native coronary artery without angina pectoris: Secondary | ICD-10-CM

## 2015-03-21 DIAGNOSIS — N644 Mastodynia: Secondary | ICD-10-CM

## 2015-03-21 DIAGNOSIS — Z87891 Personal history of nicotine dependence: Secondary | ICD-10-CM | POA: Diagnosis not present

## 2015-03-21 DIAGNOSIS — F339 Major depressive disorder, recurrent, unspecified: Secondary | ICD-10-CM | POA: Diagnosis not present

## 2015-03-21 DIAGNOSIS — M1990 Unspecified osteoarthritis, unspecified site: Secondary | ICD-10-CM | POA: Diagnosis not present

## 2015-03-21 NOTE — Progress Notes (Signed)
Patient ID: Meghan Oliver, female   DOB: Nov 13, 1940, 74 y.o.   MRN: [id number] Presents with complaint of unusual breast sensation/ tenderness in left outer quadrant of breast.Does not routinely do self breast exams, so not sure if change. Denies injury or change in exercise routine. Bilateral implants. Normal mammogram history, normal 3T mammogram at [facility] 08/2014.Marland Kitchen Paternal grandmother breast cancer at 7 and paternal aunt breast cancer history. Currently struggling with severe depression has follow-up scheduled for counseling, treatments and medication adjustments. Husband accompanied to office visit.   Exam: Affect flat, breast exam in sitting and lying position without visible retractions, dimpling or nipple discharge. No palpable nodules, mild tenderness on left outer aspect of breast.  Left breast tenderness outer aspect  Plan: Will schedule diagnostic left breast mammogram. Continue follow-up for depression treatment.

## 2015-03-24 ENCOUNTER — Telehealth: Payer: Self-pay | Admitting: *Deleted

## 2015-03-24 NOTE — Telephone Encounter (Signed)
-----   Message from Huel Cote, NP sent at 03/21/2015  3:33 PM EDT ----- Please schedule left breast diagnostic mammogram for tenderness.

## 2015-03-24 NOTE — Telephone Encounter (Signed)
Spoke with [name] Card per DPR access  that appointment is on 03/31/15 @ 3:00pm as [facility], order will faxed.

## 2015-03-25 DIAGNOSIS — F3341 Major depressive disorder, recurrent, in partial remission: Secondary | ICD-10-CM | POA: Diagnosis not present

## 2015-03-26 DIAGNOSIS — F332 Major depressive disorder, recurrent severe without psychotic features: Secondary | ICD-10-CM | POA: Diagnosis not present

## 2015-03-27 DIAGNOSIS — F332 Major depressive disorder, recurrent severe without psychotic features: Secondary | ICD-10-CM | POA: Diagnosis not present

## 2015-03-27 DIAGNOSIS — F411 Generalized anxiety disorder: Secondary | ICD-10-CM | POA: Diagnosis not present

## 2015-03-31 ENCOUNTER — Other Ambulatory Visit: Payer: Self-pay | Admitting: Obstetrics and Gynecology

## 2015-03-31 ENCOUNTER — Other Ambulatory Visit: Payer: Self-pay | Admitting: *Deleted

## 2015-03-31 DIAGNOSIS — E039 Hypothyroidism, unspecified: Secondary | ICD-10-CM | POA: Diagnosis not present

## 2015-03-31 DIAGNOSIS — K219 Gastro-esophageal reflux disease without esophagitis: Secondary | ICD-10-CM | POA: Diagnosis not present

## 2015-03-31 DIAGNOSIS — M1990 Unspecified osteoarthritis, unspecified site: Secondary | ICD-10-CM | POA: Diagnosis not present

## 2015-03-31 DIAGNOSIS — M810 Age-related osteoporosis without current pathological fracture: Secondary | ICD-10-CM | POA: Diagnosis not present

## 2015-03-31 DIAGNOSIS — I10 Essential (primary) hypertension: Secondary | ICD-10-CM | POA: Diagnosis not present

## 2015-03-31 DIAGNOSIS — E785 Hyperlipidemia, unspecified: Secondary | ICD-10-CM | POA: Diagnosis not present

## 2015-03-31 DIAGNOSIS — F332 Major depressive disorder, recurrent severe without psychotic features: Secondary | ICD-10-CM | POA: Diagnosis not present

## 2015-03-31 MED ORDER — LEVOTHYROXINE SODIUM 75 MCG PO TABS: 75.0000 ug | ORAL_TABLET | Freq: Every day | ORAL | Status: DC

## 2015-03-31 NOTE — Telephone Encounter (Signed)
Refill on synthroid sent to patient pharmacy

## 2015-04-02 DIAGNOSIS — F3341 Major depressive disorder, recurrent, in partial remission: Secondary | ICD-10-CM | POA: Diagnosis not present

## 2015-04-02 DIAGNOSIS — E039 Hypothyroidism, unspecified: Secondary | ICD-10-CM | POA: Diagnosis not present

## 2015-04-02 DIAGNOSIS — I10 Essential (primary) hypertension: Secondary | ICD-10-CM | POA: Diagnosis not present

## 2015-04-02 DIAGNOSIS — Z87891 Personal history of nicotine dependence: Secondary | ICD-10-CM | POA: Diagnosis not present

## 2015-04-02 DIAGNOSIS — F332 Major depressive disorder, recurrent severe without psychotic features: Secondary | ICD-10-CM | POA: Diagnosis not present

## 2015-04-02 DIAGNOSIS — M810 Age-related osteoporosis without current pathological fracture: Secondary | ICD-10-CM | POA: Diagnosis not present

## 2015-04-02 DIAGNOSIS — M1990 Unspecified osteoarthritis, unspecified site: Secondary | ICD-10-CM | POA: Diagnosis not present

## 2015-04-02 DIAGNOSIS — K219 Gastro-esophageal reflux disease without esophagitis: Secondary | ICD-10-CM | POA: Diagnosis not present

## 2015-04-03 ENCOUNTER — Other Ambulatory Visit: Payer: Self-pay | Admitting: *Deleted

## 2015-04-03 MED ORDER — RISEDRONATE SODIUM 150 MG PO TABS: 150.0000 mg | ORAL_TABLET | ORAL | Status: DC

## 2015-04-08 DIAGNOSIS — I10 Essential (primary) hypertension: Secondary | ICD-10-CM | POA: Diagnosis not present

## 2015-04-08 DIAGNOSIS — Z87891 Personal history of nicotine dependence: Secondary | ICD-10-CM | POA: Diagnosis not present

## 2015-04-08 DIAGNOSIS — M1990 Unspecified osteoarthritis, unspecified site: Secondary | ICD-10-CM | POA: Diagnosis not present

## 2015-04-08 DIAGNOSIS — M810 Age-related osteoporosis without current pathological fracture: Secondary | ICD-10-CM | POA: Diagnosis not present

## 2015-04-08 DIAGNOSIS — E039 Hypothyroidism, unspecified: Secondary | ICD-10-CM | POA: Diagnosis not present

## 2015-04-08 DIAGNOSIS — F332 Major depressive disorder, recurrent severe without psychotic features: Secondary | ICD-10-CM | POA: Diagnosis not present

## 2015-04-08 DIAGNOSIS — K219 Gastro-esophageal reflux disease without esophagitis: Secondary | ICD-10-CM | POA: Diagnosis not present

## 2015-04-09 DIAGNOSIS — N6011 Diffuse cystic mastopathy of right breast: Secondary | ICD-10-CM | POA: Diagnosis not present

## 2015-04-09 DIAGNOSIS — N6019 Diffuse cystic mastopathy of unspecified breast: Secondary | ICD-10-CM | POA: Diagnosis not present

## 2015-04-09 DIAGNOSIS — N6012 Diffuse cystic mastopathy of left breast: Secondary | ICD-10-CM | POA: Diagnosis not present

## 2015-04-09 DIAGNOSIS — M810 Age-related osteoporosis without current pathological fracture: Secondary | ICD-10-CM | POA: Diagnosis not present

## 2015-04-10 ENCOUNTER — Encounter: Payer: Self-pay | Admitting: Women's Health

## 2015-04-11 DIAGNOSIS — I6523 Occlusion and stenosis of bilateral carotid arteries: Secondary | ICD-10-CM | POA: Diagnosis not present

## 2015-04-11 DIAGNOSIS — F338 Other recurrent depressive disorders: Secondary | ICD-10-CM | POA: Diagnosis not present

## 2015-04-11 DIAGNOSIS — F332 Major depressive disorder, recurrent severe without psychotic features: Secondary | ICD-10-CM | POA: Diagnosis not present

## 2015-04-11 DIAGNOSIS — E039 Hypothyroidism, unspecified: Secondary | ICD-10-CM | POA: Diagnosis not present

## 2015-04-11 DIAGNOSIS — Z79899 Other long term (current) drug therapy: Secondary | ICD-10-CM | POA: Diagnosis not present

## 2015-04-11 DIAGNOSIS — E785 Hyperlipidemia, unspecified: Secondary | ICD-10-CM | POA: Diagnosis not present

## 2015-04-11 DIAGNOSIS — I10 Essential (primary) hypertension: Secondary | ICD-10-CM | POA: Diagnosis not present

## 2015-04-11 DIAGNOSIS — M810 Age-related osteoporosis without current pathological fracture: Secondary | ICD-10-CM | POA: Diagnosis not present

## 2015-04-11 DIAGNOSIS — K219 Gastro-esophageal reflux disease without esophagitis: Secondary | ICD-10-CM | POA: Diagnosis not present

## 2015-04-15 ENCOUNTER — Encounter: Payer: Self-pay | Admitting: Women's Health

## 2015-04-15 DIAGNOSIS — F332 Major depressive disorder, recurrent severe without psychotic features: Secondary | ICD-10-CM | POA: Diagnosis not present

## 2015-04-16 DIAGNOSIS — F3341 Major depressive disorder, recurrent, in partial remission: Secondary | ICD-10-CM | POA: Diagnosis not present

## 2015-04-17 DIAGNOSIS — F332 Major depressive disorder, recurrent severe without psychotic features: Secondary | ICD-10-CM | POA: Diagnosis not present

## 2015-04-18 DIAGNOSIS — F332 Major depressive disorder, recurrent severe without psychotic features: Secondary | ICD-10-CM | POA: Diagnosis not present

## 2015-04-18 DIAGNOSIS — F3181 Bipolar II disorder: Secondary | ICD-10-CM | POA: Diagnosis not present

## 2015-04-18 DIAGNOSIS — K219 Gastro-esophageal reflux disease without esophagitis: Secondary | ICD-10-CM | POA: Diagnosis not present

## 2015-04-18 DIAGNOSIS — I10 Essential (primary) hypertension: Secondary | ICD-10-CM | POA: Diagnosis not present

## 2015-04-18 DIAGNOSIS — M810 Age-related osteoporosis without current pathological fracture: Secondary | ICD-10-CM | POA: Diagnosis not present

## 2015-04-18 DIAGNOSIS — E039 Hypothyroidism, unspecified: Secondary | ICD-10-CM | POA: Diagnosis not present

## 2015-04-18 DIAGNOSIS — M1990 Unspecified osteoarthritis, unspecified site: Secondary | ICD-10-CM | POA: Diagnosis not present

## 2015-04-18 DIAGNOSIS — E785 Hyperlipidemia, unspecified: Secondary | ICD-10-CM | POA: Diagnosis not present

## 2015-04-18 DIAGNOSIS — F419 Anxiety disorder, unspecified: Secondary | ICD-10-CM | POA: Diagnosis not present

## 2015-04-22 DIAGNOSIS — E039 Hypothyroidism, unspecified: Secondary | ICD-10-CM | POA: Diagnosis not present

## 2015-04-22 DIAGNOSIS — I10 Essential (primary) hypertension: Secondary | ICD-10-CM | POA: Diagnosis not present

## 2015-04-22 DIAGNOSIS — F332 Major depressive disorder, recurrent severe without psychotic features: Secondary | ICD-10-CM | POA: Diagnosis not present

## 2015-04-22 DIAGNOSIS — Z79899 Other long term (current) drug therapy: Secondary | ICD-10-CM | POA: Diagnosis not present

## 2015-04-22 DIAGNOSIS — Z7982 Long term (current) use of aspirin: Secondary | ICD-10-CM | POA: Diagnosis not present

## 2015-04-22 DIAGNOSIS — E785 Hyperlipidemia, unspecified: Secondary | ICD-10-CM | POA: Diagnosis not present

## 2015-04-22 DIAGNOSIS — K219 Gastro-esophageal reflux disease without esophagitis: Secondary | ICD-10-CM | POA: Diagnosis not present

## 2015-04-22 DIAGNOSIS — M810 Age-related osteoporosis without current pathological fracture: Secondary | ICD-10-CM | POA: Diagnosis not present

## 2015-04-22 DIAGNOSIS — F3181 Bipolar II disorder: Secondary | ICD-10-CM | POA: Diagnosis not present

## 2015-04-22 DIAGNOSIS — F419 Anxiety disorder, unspecified: Secondary | ICD-10-CM | POA: Diagnosis not present

## 2015-04-22 DIAGNOSIS — I6523 Occlusion and stenosis of bilateral carotid arteries: Secondary | ICD-10-CM | POA: Diagnosis not present

## 2015-04-22 DIAGNOSIS — M1990 Unspecified osteoarthritis, unspecified site: Secondary | ICD-10-CM | POA: Diagnosis not present

## 2015-04-22 DIAGNOSIS — Z888 Allergy status to other drugs, medicaments and biological substances status: Secondary | ICD-10-CM | POA: Diagnosis not present

## 2015-04-22 DIAGNOSIS — N3281 Overactive bladder: Secondary | ICD-10-CM | POA: Diagnosis not present

## 2015-04-23 DIAGNOSIS — F3341 Major depressive disorder, recurrent, in partial remission: Secondary | ICD-10-CM | POA: Diagnosis not present

## 2015-04-25 DIAGNOSIS — I10 Essential (primary) hypertension: Secondary | ICD-10-CM | POA: Diagnosis not present

## 2015-04-25 DIAGNOSIS — K219 Gastro-esophageal reflux disease without esophagitis: Secondary | ICD-10-CM | POA: Diagnosis not present

## 2015-04-25 DIAGNOSIS — E039 Hypothyroidism, unspecified: Secondary | ICD-10-CM | POA: Diagnosis not present

## 2015-04-25 DIAGNOSIS — M1990 Unspecified osteoarthritis, unspecified site: Secondary | ICD-10-CM | POA: Diagnosis not present

## 2015-04-25 DIAGNOSIS — F419 Anxiety disorder, unspecified: Secondary | ICD-10-CM | POA: Diagnosis not present

## 2015-04-25 DIAGNOSIS — F332 Major depressive disorder, recurrent severe without psychotic features: Secondary | ICD-10-CM | POA: Diagnosis not present

## 2015-04-25 DIAGNOSIS — M810 Age-related osteoporosis without current pathological fracture: Secondary | ICD-10-CM | POA: Diagnosis not present

## 2015-04-29 DIAGNOSIS — K219 Gastro-esophageal reflux disease without esophagitis: Secondary | ICD-10-CM | POA: Diagnosis not present

## 2015-04-29 DIAGNOSIS — F3341 Major depressive disorder, recurrent, in partial remission: Secondary | ICD-10-CM | POA: Diagnosis not present

## 2015-04-29 DIAGNOSIS — E039 Hypothyroidism, unspecified: Secondary | ICD-10-CM | POA: Diagnosis not present

## 2015-04-29 DIAGNOSIS — M1990 Unspecified osteoarthritis, unspecified site: Secondary | ICD-10-CM | POA: Diagnosis not present

## 2015-04-29 DIAGNOSIS — M810 Age-related osteoporosis without current pathological fracture: Secondary | ICD-10-CM | POA: Diagnosis not present

## 2015-04-29 DIAGNOSIS — F419 Anxiety disorder, unspecified: Secondary | ICD-10-CM | POA: Diagnosis not present

## 2015-04-29 DIAGNOSIS — I10 Essential (primary) hypertension: Secondary | ICD-10-CM | POA: Diagnosis not present

## 2015-04-29 DIAGNOSIS — F332 Major depressive disorder, recurrent severe without psychotic features: Secondary | ICD-10-CM | POA: Diagnosis not present

## 2015-05-05 DIAGNOSIS — F3341 Major depressive disorder, recurrent, in partial remission: Secondary | ICD-10-CM | POA: Diagnosis not present

## 2015-05-06 ENCOUNTER — Other Ambulatory Visit: Payer: Medicare Other | Admitting: Internal Medicine

## 2015-05-06 DIAGNOSIS — M7060 Trochanteric bursitis, unspecified hip: Secondary | ICD-10-CM | POA: Diagnosis not present

## 2015-05-12 ENCOUNTER — Other Ambulatory Visit: Payer: Self-pay | Admitting: Internal Medicine

## 2015-05-12 DIAGNOSIS — F3341 Major depressive disorder, recurrent, in partial remission: Secondary | ICD-10-CM | POA: Diagnosis not present

## 2015-05-14 DIAGNOSIS — F3341 Major depressive disorder, recurrent, in partial remission: Secondary | ICD-10-CM | POA: Diagnosis not present

## 2015-05-15 DIAGNOSIS — M25651 Stiffness of right hip, not elsewhere classified: Secondary | ICD-10-CM | POA: Diagnosis not present

## 2015-05-15 DIAGNOSIS — M7061 Trochanteric bursitis, right hip: Secondary | ICD-10-CM | POA: Diagnosis not present

## 2015-05-15 DIAGNOSIS — M25551 Pain in right hip: Secondary | ICD-10-CM | POA: Diagnosis not present

## 2015-05-15 DIAGNOSIS — M6281 Muscle weakness (generalized): Secondary | ICD-10-CM | POA: Diagnosis not present

## 2015-05-20 ENCOUNTER — Other Ambulatory Visit: Payer: Self-pay

## 2015-05-20 ENCOUNTER — Telehealth: Payer: Self-pay

## 2015-05-20 NOTE — Telephone Encounter (Signed)
I have called her several times concerning her bone density and left messages but she has not called back. Have her schedule annual exam and I will discuss Vagifem and her DEXA. She is currently struggling with depression

## 2015-05-20 NOTE — Telephone Encounter (Signed)
Left detailed message home machine for patient that [place] will discuss Vagifem and BD at [facility]. Patient has that scheduled for 06/04/15.

## 2015-05-20 NOTE — Telephone Encounter (Signed)
I received refill request for Vagifen 10 mcg. vag tablet.  I do not see it on her current med list and only see it once on her historical med list back in 2013 prescribed by Dr. Darnell Level.  She came in June 2016 to see you for a breast problem but prior to that she has not been in in >2years and has not had a CE either.

## 2015-05-21 ENCOUNTER — Encounter: Payer: Self-pay | Admitting: Neurology

## 2015-05-21 ENCOUNTER — Ambulatory Visit (INDEPENDENT_AMBULATORY_CARE_PROVIDER_SITE_OTHER): Payer: Medicare Other | Admitting: Neurology

## 2015-05-21 VITALS — BP 110/62 | HR 63 | Ht 60.0 in | Wt 119.0 lb

## 2015-05-21 DIAGNOSIS — I2510 Atherosclerotic heart disease of native coronary artery without angina pectoris: Secondary | ICD-10-CM

## 2015-05-21 DIAGNOSIS — R251 Tremor, unspecified: Secondary | ICD-10-CM | POA: Diagnosis not present

## 2015-05-21 DIAGNOSIS — M25551 Pain in right hip: Secondary | ICD-10-CM | POA: Diagnosis not present

## 2015-05-21 DIAGNOSIS — M6281 Muscle weakness (generalized): Secondary | ICD-10-CM | POA: Diagnosis not present

## 2015-05-21 DIAGNOSIS — M25651 Stiffness of right hip, not elsewhere classified: Secondary | ICD-10-CM | POA: Diagnosis not present

## 2015-05-21 DIAGNOSIS — R413 Other amnesia: Secondary | ICD-10-CM

## 2015-05-21 DIAGNOSIS — M7061 Trochanteric bursitis, right hip: Secondary | ICD-10-CM | POA: Diagnosis not present

## 2015-05-21 MED ORDER — PRIMIDONE 50 MG PO TABS: ORAL_TABLET | ORAL | Status: DC

## 2015-05-21 NOTE — Progress Notes (Signed)
Meghan Oliver was seen today in the movement disorders clinic for neurologic consultation at the request of [name], MD.  The consultation is for the evaluation of tremor.  She is accompanied by her husband who supplements the history.  She supplied numerous records I had the opportunity to review.  She was a previous patient of Dr. love.  It appears that she last saw him about one year ago, in November, 2013.  The pt reports tremor x 2 years but her husband states at least 3-4 years.  It is in both hands, R worse than L.  She is R hand dominant.  It bothers her most with drinking coffee.  She has to drink out of a straw.  She cannot eat soup.  She does not drink caffeine.  Alcohol does improve tremor.  She has trouble writing because of the tremor.    The patient does have a very long history of depression.  She is currently on lithium and Zyprexa, which she has been on for 5 years and she states that both of these help.  She is status post ECT treatments began in January, 2000 and continued for 50 treatments of December, 2010.  In the past, the patient has been on Topamax for tremor, Inderal, 10 mg twice a day (this caused decrease in heart rate) and is currently on clonazepam.  She does not even remember being on the Topamax, so cannot remember if she had side effects or if it just did not work.  02/18/14 update:  Patient is seen back in followup today, accompanied by her husband as well as her nurse [name]), both of whom supplement the history.  Tremor is better than it was 6 weeks ago.  Had unsteadiness while in [place] and had to use a cane.  Has to have a railing to go up and down stairs.  Saw a neurologist in [place] last November as was becoming very sedated.  It was recommended that she decrease the lithium and she did.  Thinks that is why tremor is improved but admits that it tends to wax and wane.  They bring up a different concern.  Her husband recently discovered that the patient drinks 8-12 oz of  vodka per day.  Apparently, her nurse has been aware of this for years.  Pt saw her psychiatrist, [name], recently but didn't talk to him about this but now would like me to contact him about this.  She would like to see him before she goes on a big family trip to [place] on June 3.    04/08/14 update:  Pt is seen in f/u earlier than expected.  She is accompanied by her nurse, who supplements the history.  She was supposed to have an MRI brain since last visit but there was an error and this was not done. She had labs done since last visit and she has seen her PCP.  I reviewed those notes.  Her lithium was increased since last visit.  She is now on 150 mg in the AM and then 300 mg at night (was just 300 at night) and is now on ativan bid prn.  Asks me how many she can take per day.  They haven't noted increased tremor.  She did just get an iphone and is having trouble using the letters but her granddaughter showed her how to use the voice activated system so she is doing well with that.  Cannot eat with a spoon though or  things spill.  Depression seems worse as there are so family "things" going on.  Has an upcoming appt with [name].  Enjoyed vacation in [place].  04/29/14 update:  The patient was seen as a work in appointment today.  She is accompanied by her husband  who supplements the history.  She had an MRI of her brain on 04/18/2014.  It was essentially unremarkable.  No significant cerebellar atrophy.  Last visit, I did increase her primidone to 50 mg twice a day.  Her tremor has been increased.  She had 2 episodes where tremor was so bad that she could barely walk and she needed help down then stairs.  Drinking wine spritzers 3 days per week but no other alcohol.   Lithium dose has not changed since last visit but level has increased (as dose did change visit before that).  She reports that she is now off of ativan.    05/21/15 update:  The patient returns today.  She is accompanied by her husband  and her nurse, both of whom supplement the history.  I have not seen her in over a year.  Last visit, tremor had increased some because she had just increased her lithium.  She was also on Zyprexa.  We ended up increasing her primidone, 50 mg, to compensate so that she is taking 2 tablets in the morning and 1 in the afternoon.  I have not seen her since.  They never did that because she was leaving for [place] and the same day I saw her,  [name] decided to drop the lithium.   The patient reports that she went to [facility] and began to struggle with increasing depression and she even had psychosis.  [name] increased the zyprexa in Feb and had to increase the lithium.  She has undergone further ECT treatments for her depression.  Lithium was put on hold for that in April.  They did 12 unilateral tx's but that did not help so June 26 they started bilateral treatments for a month and restarted the lithium on July 26.  The bilateral ECT did help.  She is on 450 mg of lithium, 10 mg of zyprexa and 30 mg of prozac.  She is still on 50 mg bid of primidone.  She is c/o significant tremor.  Her husband states that when she was off the lithium tremor was much better.   I reviewed records since her last visit with [name].   PREVIOUS MEDICATIONS: Inderal (bradycardia), Topamax, clonazepam  ALLERGIES:   Allergies  Allergen Reactions  . Propranolol Other (See Comments)    Low blood pressure    CURRENT MEDICATIONS:  Current Outpatient Prescriptions on File Prior to Visit  Medication Sig Dispense Refill  . amLODipine (NORVASC) 10 MG tablet Take 1 tablet (10 mg total) by mouth daily. 90 tablet 3  . aspirin 81 MG tablet Take 81 mg by mouth daily.      . Calcium Carbonate-Vitamin D (CALCIUM 600/VITAMIN D PO) Take 1 tablet by mouth 2 (two) times daily.     . celecoxib (CELEBREX) 200 MG capsule Take 1 capsule (200 mg total) by mouth daily. 90 capsule 3  . cetirizine (ZYRTEC) 10 MG tablet Take 10 mg by mouth at bedtime.     . chlorthalidone (HYGROTON) 25 MG tablet Take 1 tablet (25 mg total) by mouth daily. 90 tablet 3  . Cholecalciferol (VITAMIN D3) 2000 UNITS TABS Take 2 tablets by mouth daily.     Marland Kitchen  docusate-casanthranol (PERICOLACE) 100-30 MG per capsule daily as needed.      . fesoterodine (TOVIAZ) 8 MG TB24 tablet Take 1 tablet (8 mg total) by mouth daily. 90 tablet 3  . FLUoxetine (PROZAC) 10 MG capsule Take 10 mg by mouth daily.    Marland Kitchen levothyroxine (SYNTHROID, LEVOTHROID) 75 MCG tablet Take 1 tablet (75 mcg total) by mouth daily. 90 tablet 3  . lithium carbonate 150 MG capsule Take 450 mg by mouth at bedtime.    Marland Kitchen LORazepam (ATIVAN) 0.5 MG tablet Take 0.5 mg by mouth as needed for anxiety.     Marland Kitchen losartan (COZAAR) 100 MG tablet Take 1 tablet (100 mg total) by mouth daily. 90 tablet 3  . Multiple Vitamins-Minerals (MULTIVITAMIN WITH MINERALS) tablet Take 1 tablet by mouth daily.      Marland Kitchen OLANZapine (ZYPREXA) 5 MG tablet Take 10 mg by mouth at bedtime.     . Omega-3 Fatty Acids (FISH OIL) 1200 MG CAPS Take 1 capsule by mouth daily.      Marland Kitchen omeprazole (PRILOSEC) 40 MG capsule TAKE 1 BY MOUTH TWICE DAILY 180 capsule 1  . potassium chloride SA (K-DUR,KLOR-CON) 20 MEQ tablet Take 20 mEq by mouth as needed.    . primidone (MYSOLINE) 50 MG tablet Take 50 mg by mouth 2 (two) times daily.    . risedronate (ACTONEL) 150 MG tablet Take 1 tablet (150 mg total) by mouth every 30 (thirty) days. with water on empty stomach, nothing by mouth or lie down for next 30 minutes. 3 tablet 0  . rosuvastatin (CRESTOR) 20 MG tablet Take 1 tablet (20 mg total) by mouth daily. 90 tablet 3  . valACYclovir (VALTREX) 500 MG tablet Take 1 tablet (500 mg total) by mouth daily. 5 tablet 11  . vitamin B-12 (CYANOCOBALAMIN) 1000 MCG tablet Take 1,000 mcg by mouth daily.      Marland Kitchen zolpidem (AMBIEN) 5 MG tablet Take 1 tablet (5 mg total) by mouth at bedtime as needed for sleep. 30 tablet 0   Current Facility-Administered Medications on File Prior to  Visit  Medication Dose Route Frequency Provider Last Rate Last Dose  . pneumococcal 13-valent conjugate vaccine (PREVNAR 13) injection 0.5 mL  0.5 mL Intramuscular Tomorrow-1000 [name], MD      . pneumococcal 13-valent conjugate vaccine (PREVNAR 13) injection 0.5 mL  0.5 mL Intramuscular Once [name], MD        PAST MEDICAL HISTORY:   Past Medical History  Diagnosis Date  . GERD (gastroesophageal reflux disease)   . Hyperlipidemia   . Anxiety   . Low back pain   . Osteoporosis   . Carotid disease, bilateral     carotid dopplers 12/10 with 40-59% bilateral ICA stenosis  . Bipolar disease, chronic   . Hypertension   . Depression     s/p ECT  . Migraines   . Heart murmur   . Carotid bruit     PAST SURGICAL HISTORY:   Past Surgical History  Procedure Laterality Date  . Esophagogastroduodenoscopy  02-26-04  . Appendectomy  1971  . Tubal ligation    . Augmentation mammaplasty    . Abdominal surgery      Laparotomy Ovarian cystctomy    SOCIAL HISTORY:   Social History   Social History  . Marital Status: Married    Spouse Name: Dr. Lyla Son  . Number of Children: 2  . Years of Education: N/A   Occupational History  . housewife  Social History Main Topics  . Smoking status: Former Smoker -- 2.00 packs/day for 15 years    Types: Cigarettes    Quit date: 01/13/1971  . Smokeless tobacco: Never Used  . Alcohol Use: 2.0 oz/week    4 drink(s) per week     Comment: wine, three times per week  . Drug Use: No  . Sexual Activity: No   Other Topics Concern  . Not on file   Social History Narrative    FAMILY HISTORY:   Family Status  Relation Status Death Age  . Father Deceased     CAD  . Mother Deceased     COPD  . Brother Alive     skin CA (? type)  . Child Alive     2, healthy    ROS:    A complete 10 system review of systems was obtained and was unremarkable apart from what is mentioned above.  PHYSICAL EXAMINATION:    VITALS:   Filed  Vitals:   05/21/15 1049  BP: 110/62  Pulse: 63  Height: 5' (1.524 m)  Weight: 119 lb (53.978 kg)   Wt Readings from Last 3 Encounters:  05/21/15 119 lb (53.978 kg)  01/30/15 120 lb (54.432 kg)  01/29/15 119 lb (53.978 kg)     GEN:  The patient appears younger than stated age and is in NAD.  Affect flat.  Nurse does much of talking for her again today.   HEENT:  Normocephalic, atraumatic.  The mucous membranes are moist. The superficial temporal arteries are without ropiness or tenderness. CV:  RRR Lungs:  CTAB Neck/HEME:  There are no carotid bruits bilaterally.  Neurological examination:  Orientation: The patient is alert and oriented x3.  However, her nurse has to provide nearly all of the history  Cranial nerves: There is good facial symmetry.  She does have a very flat affect today.  Pupils are equal round and reactive to light bilaterally. Fundoscopic exam reveals clear margins bilaterally. Extraocular muscles are intact. The visual fields are full to confrontational testing. The speech is fluent and clear. Soft palate rises symmetrically and there is no tongue deviation. Hearing is intact to conversational tone. Sensation: Sensation is intact to light touch throughout. Motor: Strength is 5/5 in the bilateral upper and lower extremities.   Shoulder shrug is equal and symmetric.  There is no pronator drift.   Movement examination: Tone: There is normal tone bilaterally Abnormal movements: There is an irregular tremor of the outstretched hands that increases with intention.  It is overall fairly mild but when she drinks water out of her water bottle, she does have to use 2 hands.  There is no rest tremor. Coordination:  There is no significant decremation with RAM's, including alternating supination and pronation of the forearm, hand opening and closing, finger taps, heel taps and toe taps. Gait and Station: The patient has no difficulty arising out of a deep-seated chair without  the use of the hands. The patient is slow and gait is antalgic (right hip bursitis per patient) and she has a slight decreased arm swing on the right.  LABS  Lab Results  Component Value Date   WBC 8.2 01/29/2015   HGB 12.2 01/29/2015   HCT 37.0 01/29/2015   MCV 95.1 01/29/2015   PLT 291 01/29/2015     Chemistry      Component Value Date/Time   NA 138 01/29/2015 1031   K 3.3* 01/29/2015 1031   CL 101 01/29/2015 1031  CO2 24 01/29/2015 1031   BUN 22 01/29/2015 1031   CREATININE 0.76 01/29/2015 1031   CREATININE 0.88 01/16/2015 1544      Component Value Date/Time   CALCIUM 9.6 01/29/2015 1031   ALKPHOS 39 01/29/2015 1031   AST 18 01/29/2015 1031   ALT 28 01/29/2015 1031   BILITOT 0.3 01/29/2015 1031     Lab Results  Component Value Date   LITHIUM 0.80 01/16/2015   NA 138 01/29/2015   BUN 22 01/29/2015   CREATININE 0.76 01/29/2015   TSH 2.263 01/29/2015   WBC 8.2 01/29/2015     ASSESSMENT/PLAN:  1.  Tremor.  The increase in tremor is likely due to the increase need for lithium.  Apparently, when she was off of the lithium for ECT her tremor was better.  Nonetheless, she obviously needs the lithium.  She is very frustrated with tremor, but I told the patient that I likely am not going to be able to get rid of tremor while she is on lithium.  She would like to try to increase her primidone to 100 mg in the morning and 50 mg at night.  I talked to her about adaptive devices such as weighted spoons, weighted gloves and liftware.  Her nurse states that she will not use these things in public and wants to hold on these.    -While she does have somewhat of a masked face, I think that this is primarily from depression.  I think that her slowness of movement is also from her depression and not necessarily a parkinsonism from her Zyprexa.  Her nurse had suggested that [name] said he could decrease it, if necessary but this has not been increased that long and I will just keep an  eye on that.  I talked to the patient and her husband along with her nurse about that today. 2.  Memory changes.  I suspect that this is likely from multiple ECT treatments.  She apparently has stopped drinking altogether, which I congratulated her for. 3. Follow up in 2-3 months, sooner if needed.  Much greater than 50% of this visit was spent in counseling with the patient and the family.  Total face to face time:  25 min

## 2015-05-22 ENCOUNTER — Other Ambulatory Visit: Payer: Self-pay

## 2015-05-22 ENCOUNTER — Telehealth: Payer: Self-pay

## 2015-05-22 NOTE — Telephone Encounter (Signed)
Error encounter already opened

## 2015-05-22 NOTE — Telephone Encounter (Signed)
Randol Kern, patient's nurse, called today about getting Vagifem Rx. Has CE  Scheduled 8.30.16.  I explained to her tha the patient has not had an complete exam since Dr. Darnell Level was here.  I told her [place] had only seen her once (in June) since 2014 and that was for a quick problem visit.  I explained that it was not appropriate for [place] to prescribe this medication for the patient when she has never prescribed it for her before and has not seen her for annual exam.  She said "Kasandra Knudsen had given it to her". I explained Dr. Darnell Level has been gone for 3 years. She needs an exam and updated history review for a new Rx.  She proceeded to tell me that patient is incapable of answering any questions. She said Dr. Velora Heckler will be bringing her to visit and hopefully he will be of help. She said she understood why [place] could not prescribe at this time.  She will discuss with the patient.

## 2015-05-23 DIAGNOSIS — M25551 Pain in right hip: Secondary | ICD-10-CM | POA: Diagnosis not present

## 2015-05-23 DIAGNOSIS — M25651 Stiffness of right hip, not elsewhere classified: Secondary | ICD-10-CM | POA: Diagnosis not present

## 2015-05-23 DIAGNOSIS — M6281 Muscle weakness (generalized): Secondary | ICD-10-CM | POA: Diagnosis not present

## 2015-05-23 DIAGNOSIS — M7061 Trochanteric bursitis, right hip: Secondary | ICD-10-CM | POA: Diagnosis not present

## 2015-05-26 DIAGNOSIS — M6281 Muscle weakness (generalized): Secondary | ICD-10-CM | POA: Diagnosis not present

## 2015-05-26 DIAGNOSIS — M7061 Trochanteric bursitis, right hip: Secondary | ICD-10-CM | POA: Diagnosis not present

## 2015-05-26 DIAGNOSIS — M25551 Pain in right hip: Secondary | ICD-10-CM | POA: Diagnosis not present

## 2015-05-26 DIAGNOSIS — F3341 Major depressive disorder, recurrent, in partial remission: Secondary | ICD-10-CM | POA: Diagnosis not present

## 2015-05-26 DIAGNOSIS — M25651 Stiffness of right hip, not elsewhere classified: Secondary | ICD-10-CM | POA: Diagnosis not present

## 2015-05-28 DIAGNOSIS — M7061 Trochanteric bursitis, right hip: Secondary | ICD-10-CM | POA: Diagnosis not present

## 2015-05-28 DIAGNOSIS — M6281 Muscle weakness (generalized): Secondary | ICD-10-CM | POA: Diagnosis not present

## 2015-05-28 DIAGNOSIS — M25651 Stiffness of right hip, not elsewhere classified: Secondary | ICD-10-CM | POA: Diagnosis not present

## 2015-05-28 DIAGNOSIS — M25551 Pain in right hip: Secondary | ICD-10-CM | POA: Diagnosis not present

## 2015-05-30 ENCOUNTER — Encounter: Payer: Self-pay | Admitting: Neurology

## 2015-06-02 DIAGNOSIS — M25551 Pain in right hip: Secondary | ICD-10-CM | POA: Diagnosis not present

## 2015-06-02 DIAGNOSIS — M7061 Trochanteric bursitis, right hip: Secondary | ICD-10-CM | POA: Diagnosis not present

## 2015-06-02 DIAGNOSIS — M25651 Stiffness of right hip, not elsewhere classified: Secondary | ICD-10-CM | POA: Diagnosis not present

## 2015-06-02 DIAGNOSIS — F3341 Major depressive disorder, recurrent, in partial remission: Secondary | ICD-10-CM | POA: Diagnosis not present

## 2015-06-02 DIAGNOSIS — M6281 Muscle weakness (generalized): Secondary | ICD-10-CM | POA: Diagnosis not present

## 2015-06-04 ENCOUNTER — Ambulatory Visit (INDEPENDENT_AMBULATORY_CARE_PROVIDER_SITE_OTHER): Payer: Medicare Other | Admitting: Women's Health

## 2015-06-04 ENCOUNTER — Encounter: Payer: Self-pay | Admitting: Women's Health

## 2015-06-04 VITALS — BP 126/80 | Wt 114.0 lb

## 2015-06-04 DIAGNOSIS — I2510 Atherosclerotic heart disease of native coronary artery without angina pectoris: Secondary | ICD-10-CM

## 2015-06-04 DIAGNOSIS — M25551 Pain in right hip: Secondary | ICD-10-CM | POA: Diagnosis not present

## 2015-06-04 DIAGNOSIS — Z9189 Other specified personal risk factors, not elsewhere classified: Secondary | ICD-10-CM

## 2015-06-04 DIAGNOSIS — M7061 Trochanteric bursitis, right hip: Secondary | ICD-10-CM | POA: Diagnosis not present

## 2015-06-04 DIAGNOSIS — Z87898 Personal history of other specified conditions: Secondary | ICD-10-CM

## 2015-06-04 DIAGNOSIS — M6281 Muscle weakness (generalized): Secondary | ICD-10-CM | POA: Diagnosis not present

## 2015-06-04 DIAGNOSIS — M25651 Stiffness of right hip, not elsewhere classified: Secondary | ICD-10-CM | POA: Diagnosis not present

## 2015-06-04 NOTE — Patient Instructions (Addendum)
Bone Health Our bones do many things. They provide structure, protect organs, anchor muscles, and store calcium. Adequate calcium in your diet and weight-bearing physical activity help build strong bones, improve bone amounts, and may reduce the risk of weakening of bones (osteoporosis) later in life. PEAK BONE MASS By age 75, the average woman has acquired most of her skeletal bone mass. A large decline occurs in older adults which increases the risk of osteoporosis. In women this occurs around the time of menopause. It is important for young girls to reach their peak bone mass in order to maintain bone health throughout life. A person with high bone mass as a young adult will be more likely to have a higher bone mass later in life. Not enough calcium consumption and physical activity early on could result in a failure to achieve optimum bone mass in adulthood. OSTEOPOROSIS Osteoporosis is a disease of the bones. It is defined as low bone mass with deterioration of bone structure. Osteoporosis leads to an increase risk of fractures with falls. These fractures commonly happen in the wrist, hip, and spine. While men and women of all ages and background can develop osteoporosis, some of the risk factors for osteoporosis are:  Female.  White.  Postmenopausal.  Older adults.  Small in body size.  Eating a diet low in calcium.  Physically inactive.  Smoking.  Use of some medications.  Family history. CALCIUM Calcium is a mineral needed by the body for healthy bones, teeth, and proper function of the heart, muscles, and nerves. The body cannot produce calcium so it must be absorbed through food. Good sources of calcium include:  Dairy products (low fat or nonfat milk, cheese, and yogurt).  Dark green leafy vegetables (bok choy and broccoli).  Calcium fortified foods (orange juice, cereal, bread, soy beverages, and tofu products).  Nuts (almonds). Recommended amounts of calcium vary  for individuals. RECOMMENDED CALCIUM INTAKES Age and Amount in mg per day  Children 1 to 3 years / 700 mg  Children 4 to 8 years / 1,000 mg  Children 9 to 13 years / 1,300 mg  Teens 14 to 18 years / 1,300 mg  Adults 19 to 50 years / 1,000 mg  Adult women 51 to 70 years / 1,200 mg  Adults 71 years and older / 1,200 mg  Pregnant and breastfeeding teens / 1,300 mg  Pregnant and breastfeeding adults / 1,000 mg Vitamin D also plays an important role in healthy bone development. Vitamin D helps in the absorption of calcium. WEIGHT-BEARING PHYSICAL ACTIVITY Regular physical activity has many positive health benefits. Benefits include strong bones. Weight-bearing physical activity early in life is important in reaching peak bone mass. Weight-bearing physical activities cause muscles and bones to work against gravity. Some examples of weight bearing physical activities include:  Walking, jogging, or running.  Field Hockey.  Jumping rope.  Dancing.  Soccer.  Tennis or Racquetball.  Stair climbing.  Basketball.  Hiking.  Weight lifting.  Aerobic fitness classes. Including weight-bearing physical activity into an exercise plan is a great way to keep bones healthy. Adults: Engage in at least 30 minutes of moderate physical activity on most, preferably all, days of the week. Children: Engage in at least 60 minutes of moderate physical activity on most, preferably all, days of the week. FOR MORE INFORMATION [place] Department of Agriculture, Center for Nutrition Policy and Promotion: www.cnpp.usda.gov National Osteoporosis Foundation: www.nof.org Document Released: 12/11/2003 Document Revised: 01/15/2013 Document Reviewed: 03/12/2009 ExitCare Patient Information   2015 [employer]. This information is not intended to replace advice given to you by your health care provider. Make sure you discuss any questions you have with your health care  provider. Osteoporosis Throughout your life, your body breaks down old bone and replaces it with new bone. As you get older, your body does not replace bone as quickly as it breaks it down. By the age of 63 years, most people begin to gradually lose bone because of the imbalance between bone loss and replacement. Some people lose more bone than others. Bone loss beyond a specified normal degree is considered osteoporosis.  Osteoporosis affects the strength and durability of your bones. The inside of the ends of your bones and your flat bones, like the bones of your pelvis, look like honeycomb, filled with tiny open spaces. As bone loss occurs, your bones become less dense. This means that the open spaces inside your bones become bigger and the walls between these spaces become thinner. This makes your bones weaker. Bones of a person with osteoporosis can become so weak that they can break (fracture) during minor accidents, such as a simple fall. CAUSES  The following factors have been associated with the development of osteoporosis:  Smoking.  Drinking more than 2 alcoholic drinks several days per week.  Long-term use of certain medicines:  Corticosteroids.  Chemotherapy medicines.  Thyroid medicines.  Antiepileptic medicines.  Gonadal hormone suppression medicine.  Immunosuppression medicine.  Being underweight.  Lack of physical activity.  Lack of exposure to the sun. This can lead to vitamin D deficiency.  Certain medical conditions:  Certain inflammatory bowel diseases, such as Crohn disease and ulcerative colitis.  Diabetes.  Hyperthyroidism.  Hyperparathyroidism. RISK FACTORS Anyone can develop osteoporosis. However, the following factors can increase your risk of developing osteoporosis:  Gender--Women are at higher risk than men.  Age--Being older than 50 years increases your risk.  Ethnicity--White and Asian people have an increased risk.  Weight --Being  extremely underweight can increase your risk of osteoporosis.  Family history of osteoporosis--Having a family member who has developed osteoporosis can increase your risk. SYMPTOMS  Usually, people with osteoporosis have no symptoms.  DIAGNOSIS  Signs during a physical exam that may prompt your caregiver to suspect osteoporosis include:  Decreased height. This is usually caused by the compression of the bones that form your spine (vertebrae) because they have weakened and become fractured.  A curving or rounding of the upper back (kyphosis). To confirm signs of osteoporosis, your caregiver may request a procedure that uses 2 low-dose X-ray beams with different levels of energy to measure your bone mineral density (dual-energy X-ray absorptiometry [DXA]). Also, your caregiver may check your level of vitamin D. TREATMENT  The goal of osteoporosis treatment is to strengthen bones in order to decrease the risk of bone fractures. There are different types of medicines available to help achieve this goal. Some of these medicines work by slowing the processes of bone loss. Some medicines work by increasing bone density. Treatment also involves making sure that your levels of calcium and vitamin D are adequate. PREVENTION  There are things you can do to help prevent osteoporosis. Adequate intake of calcium and vitamin D can help you achieve optimal bone mineral density. Regular exercise can also help, especially resistance and weight-bearing activities. If you smoke, quitting smoking is an important part of osteoporosis prevention. MAKE SURE YOU:  Understand these instructions.  Will watch your condition.  Will get help right away if  you are not doing well or get worse. FOR MORE INFORMATION www.osteo.org and EquipmentWeekly.com.ee Document Released: 06/30/2005 Document Revised: 01/15/2013 Document Reviewed: 09/04/2011 [facility] Patient Information 2015 [place], [place]. This information is not intended to  replace advice given to you by your health care provider. Make sure you discuss any questions you have with your health care provider. Denosumab injection What is this medicine? DENOSUMAB (den oh sue mab) slows bone breakdown. Prolia is used to treat osteoporosis in women after menopause and in men. Delton See is used to prevent bone fractures and other bone problems caused by cancer bone metastases. Delton See is also used to treat giant cell tumor of the bone. This medicine may be used for other purposes; ask your health care provider or pharmacist if you have questions. COMMON BRAND NAME(S): Prolia, XGEVA What should I tell my health care provider before I take this medicine? They need to know if you have any of these conditions: -dental disease -eczema -infection or history of infections -kidney disease or on dialysis -low blood calcium or vitamin D -malabsorption syndrome -scheduled to have surgery or tooth extraction -taking medicine that contains denosumab -thyroid or parathyroid disease -an unusual reaction to denosumab, other medicines, foods, dyes, or preservatives -pregnant or trying to get pregnant -breast-feeding How should I use this medicine? This medicine is for injection under the skin. It is given by a health care professional in a hospital or clinic setting. If you are getting Prolia, a special MedGuide will be given to you by the pharmacist with each prescription and refill. Be sure to read this information carefully each time. For Prolia, talk to your pediatrician regarding the use of this medicine in children. Special care may be needed. For Delton See, talk to your pediatrician regarding the use of this medicine in children. While this drug may be prescribed for children as young as 13 years for selected conditions, precautions do apply. Overdosage: If you think you've taken too much of this medicine contact a poison control center or emergency room at once. Overdosage: If you think  you have taken too much of this medicine contact a poison control center or emergency room at once. NOTE: This medicine is only for you. Do not share this medicine with others. What if I miss a dose? It is important not to miss your dose. Call your doctor or health care professional if you are unable to keep an appointment. What may interact with this medicine? Do not take this medicine with any of the following medications: -other medicines containing denosumab This medicine may also interact with the following medications: -medicines that suppress the immune system -medicines that treat cancer -steroid medicines like prednisone or cortisone This list may not describe all possible interactions. Give your health care provider a list of all the medicines, herbs, non-prescription drugs, or dietary supplements you use. Also tell them if you smoke, drink alcohol, or use illegal drugs. Some items may interact with your medicine. What should I watch for while using this medicine? Visit your doctor or health care professional for regular checks on your progress. Your doctor or health care professional may order blood tests and other tests to see how you are doing. Call your doctor or health care professional if you get a cold or other infection while receiving this medicine. Do not treat yourself. This medicine may decrease your body's ability to fight infection. You should make sure you get enough calcium and vitamin D while you are taking this medicine, unless your doctor  tells you not to. Discuss the foods you eat and the vitamins you take with your health care professional. See your dentist regularly. Brush and floss your teeth as directed. Before you have any dental work done, tell your dentist you are receiving this medicine. Do not become pregnant while taking this medicine or for 5 months after stopping it. Women should inform their doctor if they wish to become pregnant or think they might be  pregnant. There is a potential for serious side effects to an unborn child. Talk to your health care professional or pharmacist for more information. What side effects may I notice from receiving this medicine? Side effects that you should report to your doctor or health care professional as soon as possible: -allergic reactions like skin rash, itching or hives, swelling of the face, lips, or tongue -breathing problems -chest pain -fast, irregular heartbeat -feeling faint or lightheaded, falls -fever, chills, or any other sign of infection -muscle spasms, tightening, or twitches -numbness or tingling -skin blisters or bumps, or is dry, peels, or red -slow healing or unexplained pain in the mouth or jaw -unusual bleeding or bruising Side effects that usually do not require medical attention (Report these to your doctor or health care professional if they continue or are bothersome.): -muscle pain -stomach upset, gas This list may not describe all possible side effects. Call your doctor for medical advice about side effects. You may report side effects to FDA at 1-800-FDA-1088. Where should I keep my medicine? This medicine is only given in a clinic, doctor's office, or other health care setting and will not be stored at home. NOTE: This sheet is a summary. It may not cover all possible information. If you have questions about this medicine, talk to your doctor, pharmacist, or health care provider.  2015, Elsevier/Gold Standard. (2012-03-20 12:37:47)

## 2015-06-04 NOTE — Progress Notes (Signed)
Meghan Oliver [birth year] [id number]    History:    Presents for breast and pelvic exam. Postmenopausal on no HRT with no bleeding. Current on vaccines. Normal Pap and mammogram history. 04/2015 DEXA T score -2.1 at risk, -2 at femoral neck, increased fracture risk hip 14%, overall 24.1%. Currently struggling with severe depression, tremors, unsteady gait and poor appetite.   Past medical history, past surgical history, family history and social history were all reviewed and documented in the EPIC chart. 2 children both live away. Husband supportive. Mother struggled with depression.  ROS:  A ROS was performed and pertinent positives and negatives are included.  Exam: Affect flat  Filed Vitals:   06/04/15 1030  BP: 126/80    General appearance:  Normal Thyroid:  Symmetrical, normal in size, without palpable masses or nodularity. Respiratory  Auscultation:  Clear without wheezing or rhonchi Cardiovascular  Auscultation:  Regular rate, without rubs, murmurs or gallops  Edema/varicosities:  Not grossly evident Abdominal  Soft,nontender, without masses, guarding or rebound.  Liver/spleen:  No organomegaly noted  Hernia:  None appreciated  Skin  Inspection:  Grossly normal numerous moles and keratosis   Breasts: Examined lying and sitting/bilateral saline implants.     Right: Without masses, retractions, discharge or axillary adenopathy.     Left: Without masses, retractions, discharge or axillary adenopathy. Gentitourinary   Inguinal/mons:  Normal without inguinal adenopathy  External genitalia:  Normal  BUS/Urethra/Skene's glands:  Normal  Vagina:  Atrophic Cervix:  Normal  Uterus:   normal in size, shape and contour.  Midline and mobile  Adnexa/parametria:     Rt: Without masses or tenderness.   Lt: Without masses or tenderness.  Anus and perineum: Normal  Digital rectal exam: Normal sphincter tone without palpated masses or tenderness  Assessment/Plan:  74 y.o. MWF G2P2 for  breast and pelvic exam.  Postmenopausal/no HRT/no bleeding  Atrophic vaginitis-asymptomatic/not sexually active Osteopenia with elevated fracture risk, unsteady gait, frail, tremors. Severe depression-psychiatrist, counseling, medications  Plan: Osteopenia/decreasing fracture risk reviewed with both patient and husband. On numerous medications, history of GERD, Would like to proceed with Prolia. Reviewed importance of home safety, fall prevention. 01/2015 calcium level 9.6. Will check coverage and proceed. Has no dental work scheduled. SBE's, continue annual screening mammogram, calcium rich diet, vitamin D level normal.    Jolana Runkles J WHNP, 2:10 PM 06/04/2015

## 2015-06-06 ENCOUNTER — Other Ambulatory Visit: Payer: Self-pay | Admitting: Orthopedic Surgery

## 2015-06-06 DIAGNOSIS — M5416 Radiculopathy, lumbar region: Secondary | ICD-10-CM | POA: Diagnosis not present

## 2015-06-06 DIAGNOSIS — R102 Pelvic and perineal pain: Secondary | ICD-10-CM

## 2015-06-06 DIAGNOSIS — M7918 Myalgia, other site: Secondary | ICD-10-CM

## 2015-06-06 DIAGNOSIS — M25551 Pain in right hip: Secondary | ICD-10-CM | POA: Diagnosis not present

## 2015-06-10 DIAGNOSIS — M25551 Pain in right hip: Secondary | ICD-10-CM | POA: Diagnosis not present

## 2015-06-10 DIAGNOSIS — Z23 Encounter for immunization: Secondary | ICD-10-CM | POA: Diagnosis not present

## 2015-06-10 DIAGNOSIS — M25651 Stiffness of right hip, not elsewhere classified: Secondary | ICD-10-CM | POA: Diagnosis not present

## 2015-06-10 DIAGNOSIS — M6281 Muscle weakness (generalized): Secondary | ICD-10-CM | POA: Diagnosis not present

## 2015-06-10 DIAGNOSIS — M7061 Trochanteric bursitis, right hip: Secondary | ICD-10-CM | POA: Diagnosis not present

## 2015-06-11 ENCOUNTER — Telehealth: Payer: Self-pay | Admitting: Gynecology

## 2015-06-11 DIAGNOSIS — M7061 Trochanteric bursitis, right hip: Secondary | ICD-10-CM | POA: Diagnosis not present

## 2015-06-11 DIAGNOSIS — F3341 Major depressive disorder, recurrent, in partial remission: Secondary | ICD-10-CM | POA: Diagnosis not present

## 2015-06-11 DIAGNOSIS — M25551 Pain in right hip: Secondary | ICD-10-CM | POA: Diagnosis not present

## 2015-06-11 DIAGNOSIS — M25651 Stiffness of right hip, not elsewhere classified: Secondary | ICD-10-CM | POA: Diagnosis not present

## 2015-06-11 DIAGNOSIS — M6281 Muscle weakness (generalized): Secondary | ICD-10-CM | POA: Diagnosis not present

## 2015-06-11 NOTE — Telephone Encounter (Signed)
Please check Prolia coverage, osteopenia with elevated fracture risk. Also unsteady on feet, tremors, frail. Normal recent calcium level  This is a note from Elon Alas, NP. Will check benefits for patient.

## 2015-06-12 DIAGNOSIS — F332 Major depressive disorder, recurrent severe without psychotic features: Secondary | ICD-10-CM | POA: Diagnosis not present

## 2015-06-13 DIAGNOSIS — R102 Pelvic and perineal pain: Secondary | ICD-10-CM | POA: Diagnosis not present

## 2015-06-16 DIAGNOSIS — M6281 Muscle weakness (generalized): Secondary | ICD-10-CM | POA: Diagnosis not present

## 2015-06-16 DIAGNOSIS — M25551 Pain in right hip: Secondary | ICD-10-CM | POA: Diagnosis not present

## 2015-06-16 DIAGNOSIS — M7061 Trochanteric bursitis, right hip: Secondary | ICD-10-CM | POA: Diagnosis not present

## 2015-06-16 DIAGNOSIS — M25651 Stiffness of right hip, not elsewhere classified: Secondary | ICD-10-CM | POA: Diagnosis not present

## 2015-06-18 DIAGNOSIS — F3341 Major depressive disorder, recurrent, in partial remission: Secondary | ICD-10-CM | POA: Diagnosis not present

## 2015-06-23 DIAGNOSIS — F3341 Major depressive disorder, recurrent, in partial remission: Secondary | ICD-10-CM | POA: Diagnosis not present

## 2015-06-23 DIAGNOSIS — M7061 Trochanteric bursitis, right hip: Secondary | ICD-10-CM | POA: Diagnosis not present

## 2015-06-23 DIAGNOSIS — M25551 Pain in right hip: Secondary | ICD-10-CM | POA: Diagnosis not present

## 2015-06-23 DIAGNOSIS — M25651 Stiffness of right hip, not elsewhere classified: Secondary | ICD-10-CM | POA: Diagnosis not present

## 2015-06-23 DIAGNOSIS — M6281 Muscle weakness (generalized): Secondary | ICD-10-CM | POA: Diagnosis not present

## 2015-06-24 ENCOUNTER — Encounter: Payer: Self-pay | Admitting: Gynecology

## 2015-06-24 DIAGNOSIS — M7061 Trochanteric bursitis, right hip: Secondary | ICD-10-CM | POA: Diagnosis not present

## 2015-06-24 DIAGNOSIS — M25551 Pain in right hip: Secondary | ICD-10-CM | POA: Diagnosis not present

## 2015-06-24 NOTE — Telephone Encounter (Signed)
Phone call to pt, received benefits from insurance company, No deductible ($166 met), No co pay, $0 cost to patient with Prolia injection only. Calcium 9.6 01/29/15. Asked pt to return call to schedule appointment. 

## 2015-06-25 DIAGNOSIS — F3341 Major depressive disorder, recurrent, in partial remission: Secondary | ICD-10-CM | POA: Diagnosis not present

## 2015-06-25 DIAGNOSIS — M25612 Stiffness of left shoulder, not elsewhere classified: Secondary | ICD-10-CM | POA: Diagnosis not present

## 2015-06-25 DIAGNOSIS — M25512 Pain in left shoulder: Secondary | ICD-10-CM | POA: Diagnosis not present

## 2015-06-25 DIAGNOSIS — M25551 Pain in right hip: Secondary | ICD-10-CM | POA: Diagnosis not present

## 2015-06-25 DIAGNOSIS — M7061 Trochanteric bursitis, right hip: Secondary | ICD-10-CM | POA: Diagnosis not present

## 2015-06-25 DIAGNOSIS — M25651 Stiffness of right hip, not elsewhere classified: Secondary | ICD-10-CM | POA: Diagnosis not present

## 2015-06-25 DIAGNOSIS — M6281 Muscle weakness (generalized): Secondary | ICD-10-CM | POA: Diagnosis not present

## 2015-06-27 NOTE — Telephone Encounter (Signed)
Left message to call concerning Prolia/bone health.

## 2015-06-30 DIAGNOSIS — E785 Hyperlipidemia, unspecified: Secondary | ICD-10-CM | POA: Diagnosis not present

## 2015-06-30 DIAGNOSIS — M25512 Pain in left shoulder: Secondary | ICD-10-CM | POA: Diagnosis not present

## 2015-06-30 DIAGNOSIS — M25651 Stiffness of right hip, not elsewhere classified: Secondary | ICD-10-CM | POA: Diagnosis not present

## 2015-06-30 DIAGNOSIS — I10 Essential (primary) hypertension: Secondary | ICD-10-CM | POA: Diagnosis not present

## 2015-06-30 DIAGNOSIS — F419 Anxiety disorder, unspecified: Secondary | ICD-10-CM | POA: Diagnosis not present

## 2015-06-30 DIAGNOSIS — M1990 Unspecified osteoarthritis, unspecified site: Secondary | ICD-10-CM | POA: Diagnosis not present

## 2015-06-30 DIAGNOSIS — M25551 Pain in right hip: Secondary | ICD-10-CM | POA: Diagnosis not present

## 2015-06-30 DIAGNOSIS — M25612 Stiffness of left shoulder, not elsewhere classified: Secondary | ICD-10-CM | POA: Diagnosis not present

## 2015-06-30 DIAGNOSIS — K219 Gastro-esophageal reflux disease without esophagitis: Secondary | ICD-10-CM | POA: Diagnosis not present

## 2015-06-30 DIAGNOSIS — E039 Hypothyroidism, unspecified: Secondary | ICD-10-CM | POA: Diagnosis not present

## 2015-06-30 DIAGNOSIS — M7061 Trochanteric bursitis, right hip: Secondary | ICD-10-CM | POA: Diagnosis not present

## 2015-06-30 DIAGNOSIS — M810 Age-related osteoporosis without current pathological fracture: Secondary | ICD-10-CM | POA: Diagnosis not present

## 2015-06-30 DIAGNOSIS — F332 Major depressive disorder, recurrent severe without psychotic features: Secondary | ICD-10-CM | POA: Diagnosis not present

## 2015-06-30 DIAGNOSIS — M6281 Muscle weakness (generalized): Secondary | ICD-10-CM | POA: Diagnosis not present

## 2015-07-01 DIAGNOSIS — F3341 Major depressive disorder, recurrent, in partial remission: Secondary | ICD-10-CM | POA: Diagnosis not present

## 2015-07-02 ENCOUNTER — Telehealth: Payer: Self-pay | Admitting: Gynecology

## 2015-07-02 DIAGNOSIS — M25651 Stiffness of right hip, not elsewhere classified: Secondary | ICD-10-CM | POA: Diagnosis not present

## 2015-07-02 DIAGNOSIS — M25612 Stiffness of left shoulder, not elsewhere classified: Secondary | ICD-10-CM | POA: Diagnosis not present

## 2015-07-02 DIAGNOSIS — M25551 Pain in right hip: Secondary | ICD-10-CM | POA: Diagnosis not present

## 2015-07-02 DIAGNOSIS — M1990 Unspecified osteoarthritis, unspecified site: Secondary | ICD-10-CM | POA: Diagnosis not present

## 2015-07-02 DIAGNOSIS — F332 Major depressive disorder, recurrent severe without psychotic features: Secondary | ICD-10-CM | POA: Diagnosis not present

## 2015-07-02 DIAGNOSIS — K219 Gastro-esophageal reflux disease without esophagitis: Secondary | ICD-10-CM | POA: Diagnosis not present

## 2015-07-02 DIAGNOSIS — E039 Hypothyroidism, unspecified: Secondary | ICD-10-CM | POA: Diagnosis not present

## 2015-07-02 DIAGNOSIS — E785 Hyperlipidemia, unspecified: Secondary | ICD-10-CM | POA: Diagnosis not present

## 2015-07-02 DIAGNOSIS — F419 Anxiety disorder, unspecified: Secondary | ICD-10-CM | POA: Diagnosis not present

## 2015-07-02 DIAGNOSIS — M25512 Pain in left shoulder: Secondary | ICD-10-CM | POA: Diagnosis not present

## 2015-07-02 DIAGNOSIS — M810 Age-related osteoporosis without current pathological fracture: Secondary | ICD-10-CM | POA: Diagnosis not present

## 2015-07-02 DIAGNOSIS — M6281 Muscle weakness (generalized): Secondary | ICD-10-CM | POA: Diagnosis not present

## 2015-07-02 DIAGNOSIS — M7061 Trochanteric bursitis, right hip: Secondary | ICD-10-CM | POA: Diagnosis not present

## 2015-07-02 DIAGNOSIS — I10 Essential (primary) hypertension: Secondary | ICD-10-CM | POA: Diagnosis not present

## 2015-07-02 NOTE — Telephone Encounter (Signed)
Need pt to call and schedule Prolia. Calcium level normal

## 2015-07-03 NOTE — Telephone Encounter (Signed)
Prolia scheduled for 07/08/15 for injection.  Insurance benefits subject to deductible $166 (met). Secondary insurance coverage $0 cost to pt. Calcium level  9.6 on 01/18/15

## 2015-07-04 DIAGNOSIS — K219 Gastro-esophageal reflux disease without esophagitis: Secondary | ICD-10-CM | POA: Diagnosis not present

## 2015-07-04 DIAGNOSIS — F332 Major depressive disorder, recurrent severe without psychotic features: Secondary | ICD-10-CM | POA: Diagnosis not present

## 2015-07-04 DIAGNOSIS — E785 Hyperlipidemia, unspecified: Secondary | ICD-10-CM | POA: Diagnosis not present

## 2015-07-04 DIAGNOSIS — F419 Anxiety disorder, unspecified: Secondary | ICD-10-CM | POA: Diagnosis not present

## 2015-07-04 DIAGNOSIS — M1990 Unspecified osteoarthritis, unspecified site: Secondary | ICD-10-CM | POA: Diagnosis not present

## 2015-07-04 DIAGNOSIS — M810 Age-related osteoporosis without current pathological fracture: Secondary | ICD-10-CM | POA: Diagnosis not present

## 2015-07-04 DIAGNOSIS — E039 Hypothyroidism, unspecified: Secondary | ICD-10-CM | POA: Diagnosis not present

## 2015-07-04 DIAGNOSIS — I10 Essential (primary) hypertension: Secondary | ICD-10-CM | POA: Diagnosis not present

## 2015-07-04 DIAGNOSIS — F3181 Bipolar II disorder: Secondary | ICD-10-CM | POA: Diagnosis not present

## 2015-07-04 NOTE — Telephone Encounter (Signed)
Vm from pt , confused about Prolia and oral medication for bone. I left VM explaining that she will only take Prolia every 6 months and not oral treatment medication for osteopenia.Instructed her that she should call office with any other questions.

## 2015-07-07 DIAGNOSIS — F419 Anxiety disorder, unspecified: Secondary | ICD-10-CM | POA: Diagnosis not present

## 2015-07-07 DIAGNOSIS — F332 Major depressive disorder, recurrent severe without psychotic features: Secondary | ICD-10-CM | POA: Diagnosis not present

## 2015-07-07 DIAGNOSIS — F333 Major depressive disorder, recurrent, severe with psychotic symptoms: Secondary | ICD-10-CM | POA: Diagnosis not present

## 2015-07-07 DIAGNOSIS — M1990 Unspecified osteoarthritis, unspecified site: Secondary | ICD-10-CM | POA: Diagnosis not present

## 2015-07-07 DIAGNOSIS — I10 Essential (primary) hypertension: Secondary | ICD-10-CM | POA: Diagnosis not present

## 2015-07-07 DIAGNOSIS — K219 Gastro-esophageal reflux disease without esophagitis: Secondary | ICD-10-CM | POA: Diagnosis not present

## 2015-07-07 DIAGNOSIS — E039 Hypothyroidism, unspecified: Secondary | ICD-10-CM | POA: Diagnosis not present

## 2015-07-07 DIAGNOSIS — M810 Age-related osteoporosis without current pathological fracture: Secondary | ICD-10-CM | POA: Diagnosis not present

## 2015-07-08 ENCOUNTER — Ambulatory Visit (INDEPENDENT_AMBULATORY_CARE_PROVIDER_SITE_OTHER): Payer: Medicare Other | Admitting: Gynecology

## 2015-07-08 DIAGNOSIS — M810 Age-related osteoporosis without current pathological fracture: Secondary | ICD-10-CM

## 2015-07-08 MED ORDER — DENOSUMAB 60 MG/ML ~~LOC~~ SOLN
60.0000 mg | Freq: Once | SUBCUTANEOUS | Status: AC
  Administered 2015-07-08: 60 mg via SUBCUTANEOUS

## 2015-07-11 DIAGNOSIS — F332 Major depressive disorder, recurrent severe without psychotic features: Secondary | ICD-10-CM | POA: Diagnosis not present

## 2015-07-11 DIAGNOSIS — M1990 Unspecified osteoarthritis, unspecified site: Secondary | ICD-10-CM | POA: Diagnosis not present

## 2015-07-11 DIAGNOSIS — K219 Gastro-esophageal reflux disease without esophagitis: Secondary | ICD-10-CM | POA: Diagnosis not present

## 2015-07-11 DIAGNOSIS — I10 Essential (primary) hypertension: Secondary | ICD-10-CM | POA: Diagnosis not present

## 2015-07-11 DIAGNOSIS — F419 Anxiety disorder, unspecified: Secondary | ICD-10-CM | POA: Diagnosis not present

## 2015-07-11 DIAGNOSIS — E039 Hypothyroidism, unspecified: Secondary | ICD-10-CM | POA: Diagnosis not present

## 2015-07-11 DIAGNOSIS — M810 Age-related osteoporosis without current pathological fracture: Secondary | ICD-10-CM | POA: Diagnosis not present

## 2015-07-11 DIAGNOSIS — E785 Hyperlipidemia, unspecified: Secondary | ICD-10-CM | POA: Diagnosis not present

## 2015-07-11 DIAGNOSIS — Z87891 Personal history of nicotine dependence: Secondary | ICD-10-CM | POA: Diagnosis not present

## 2015-07-11 NOTE — Telephone Encounter (Signed)
Pt received Prolia injection KW CMA

## 2015-07-14 DIAGNOSIS — Z79899 Other long term (current) drug therapy: Secondary | ICD-10-CM | POA: Diagnosis not present

## 2015-07-14 DIAGNOSIS — E785 Hyperlipidemia, unspecified: Secondary | ICD-10-CM | POA: Diagnosis not present

## 2015-07-14 DIAGNOSIS — I10 Essential (primary) hypertension: Secondary | ICD-10-CM | POA: Diagnosis not present

## 2015-07-14 DIAGNOSIS — F332 Major depressive disorder, recurrent severe without psychotic features: Secondary | ICD-10-CM | POA: Diagnosis not present

## 2015-07-14 DIAGNOSIS — F329 Major depressive disorder, single episode, unspecified: Secondary | ICD-10-CM | POA: Diagnosis not present

## 2015-07-14 DIAGNOSIS — Z87891 Personal history of nicotine dependence: Secondary | ICD-10-CM | POA: Diagnosis not present

## 2015-07-16 DIAGNOSIS — F3341 Major depressive disorder, recurrent, in partial remission: Secondary | ICD-10-CM | POA: Diagnosis not present

## 2015-07-18 DIAGNOSIS — E039 Hypothyroidism, unspecified: Secondary | ICD-10-CM | POA: Diagnosis not present

## 2015-07-18 DIAGNOSIS — F419 Anxiety disorder, unspecified: Secondary | ICD-10-CM | POA: Diagnosis not present

## 2015-07-18 DIAGNOSIS — F332 Major depressive disorder, recurrent severe without psychotic features: Secondary | ICD-10-CM | POA: Diagnosis not present

## 2015-07-18 DIAGNOSIS — M1990 Unspecified osteoarthritis, unspecified site: Secondary | ICD-10-CM | POA: Diagnosis not present

## 2015-07-18 DIAGNOSIS — Z87891 Personal history of nicotine dependence: Secondary | ICD-10-CM | POA: Diagnosis not present

## 2015-07-18 DIAGNOSIS — K219 Gastro-esophageal reflux disease without esophagitis: Secondary | ICD-10-CM | POA: Diagnosis not present

## 2015-07-18 DIAGNOSIS — M810 Age-related osteoporosis without current pathological fracture: Secondary | ICD-10-CM | POA: Diagnosis not present

## 2015-07-18 DIAGNOSIS — I10 Essential (primary) hypertension: Secondary | ICD-10-CM | POA: Diagnosis not present

## 2015-07-18 DIAGNOSIS — E785 Hyperlipidemia, unspecified: Secondary | ICD-10-CM | POA: Diagnosis not present

## 2015-07-22 DIAGNOSIS — K219 Gastro-esophageal reflux disease without esophagitis: Secondary | ICD-10-CM | POA: Diagnosis not present

## 2015-07-22 DIAGNOSIS — M1990 Unspecified osteoarthritis, unspecified site: Secondary | ICD-10-CM | POA: Diagnosis not present

## 2015-07-22 DIAGNOSIS — F332 Major depressive disorder, recurrent severe without psychotic features: Secondary | ICD-10-CM | POA: Diagnosis not present

## 2015-07-22 DIAGNOSIS — E039 Hypothyroidism, unspecified: Secondary | ICD-10-CM | POA: Diagnosis not present

## 2015-07-22 DIAGNOSIS — F419 Anxiety disorder, unspecified: Secondary | ICD-10-CM | POA: Diagnosis not present

## 2015-07-22 DIAGNOSIS — M810 Age-related osteoporosis without current pathological fracture: Secondary | ICD-10-CM | POA: Diagnosis not present

## 2015-07-22 DIAGNOSIS — E785 Hyperlipidemia, unspecified: Secondary | ICD-10-CM | POA: Diagnosis not present

## 2015-07-22 DIAGNOSIS — I10 Essential (primary) hypertension: Secondary | ICD-10-CM | POA: Diagnosis not present

## 2015-07-30 ENCOUNTER — Telehealth: Payer: Self-pay | Admitting: Neurology

## 2015-07-30 DIAGNOSIS — F3341 Major depressive disorder, recurrent, in partial remission: Secondary | ICD-10-CM | POA: Diagnosis not present

## 2015-07-30 NOTE — Telephone Encounter (Signed)
Pt husband called and stated that the patient is all better due to them stopping the lithium. They canceled appt on 07-31-15 and did not resch

## 2015-07-31 ENCOUNTER — Ambulatory Visit: Payer: Medicare Other | Admitting: Neurology

## 2015-08-01 ENCOUNTER — Ambulatory Visit: Payer: Medicare Other | Admitting: Neurology

## 2015-08-01 ENCOUNTER — Other Ambulatory Visit: Payer: Self-pay | Admitting: Internal Medicine

## 2015-08-04 ENCOUNTER — Telehealth: Payer: Self-pay | Admitting: Internal Medicine

## 2015-08-04 DIAGNOSIS — M7061 Trochanteric bursitis, right hip: Secondary | ICD-10-CM | POA: Diagnosis not present

## 2015-08-04 DIAGNOSIS — F3341 Major depressive disorder, recurrent, in partial remission: Secondary | ICD-10-CM | POA: Diagnosis not present

## 2015-08-04 MED ORDER — VALACYCLOVIR HCL 500 MG PO TABS: 500.0000 mg | ORAL_TABLET | Freq: Every day | ORAL | Status: DC

## 2015-08-04 NOTE — Telephone Encounter (Signed)
Wants Valtrex refilled for fever blister. Electronic prescription to pharmacy.

## 2015-08-05 ENCOUNTER — Other Ambulatory Visit: Payer: Self-pay

## 2015-08-05 MED ORDER — VALACYCLOVIR HCL 500 MG PO TABS: 500.0000 mg | ORAL_TABLET | Freq: Every day | ORAL | Status: DC

## 2015-08-06 DIAGNOSIS — M1990 Unspecified osteoarthritis, unspecified site: Secondary | ICD-10-CM | POA: Diagnosis not present

## 2015-08-06 DIAGNOSIS — E785 Hyperlipidemia, unspecified: Secondary | ICD-10-CM | POA: Diagnosis not present

## 2015-08-06 DIAGNOSIS — M810 Age-related osteoporosis without current pathological fracture: Secondary | ICD-10-CM | POA: Diagnosis not present

## 2015-08-06 DIAGNOSIS — K219 Gastro-esophageal reflux disease without esophagitis: Secondary | ICD-10-CM | POA: Diagnosis not present

## 2015-08-06 DIAGNOSIS — I10 Essential (primary) hypertension: Secondary | ICD-10-CM | POA: Diagnosis not present

## 2015-08-06 DIAGNOSIS — F419 Anxiety disorder, unspecified: Secondary | ICD-10-CM | POA: Diagnosis not present

## 2015-08-06 DIAGNOSIS — F332 Major depressive disorder, recurrent severe without psychotic features: Secondary | ICD-10-CM | POA: Diagnosis not present

## 2015-08-06 DIAGNOSIS — E039 Hypothyroidism, unspecified: Secondary | ICD-10-CM | POA: Diagnosis not present

## 2015-08-13 ENCOUNTER — Other Ambulatory Visit: Payer: Self-pay | Admitting: Internal Medicine

## 2015-08-15 ENCOUNTER — Telehealth: Payer: Self-pay | Admitting: Internal Medicine

## 2015-08-15 NOTE — Telephone Encounter (Signed)
Calling to ask for a CPE next week.  Advised that we do not have any openings next week for a PE.  Wants [name] to call her if we do.   Offered her 11/22 @ 2pm.  States she is leaving for a trip that day and unable to come on that day.  States she is leaving for [facility] for the winter on 12/7.  Hasn't been doing well.  Has been in a deep depression and not attending to the things that she needs to.  Would like to have a PE before going to [facility].    Please let me know if you're willing to add-on anywhere on your calendar and I'll call patient to advise.

## 2015-08-15 NOTE — Telephone Encounter (Signed)
See November 16

## 2015-08-18 ENCOUNTER — Telehealth: Payer: Self-pay | Admitting: Internal Medicine

## 2015-08-18 DIAGNOSIS — F3341 Major depressive disorder, recurrent, in partial remission: Secondary | ICD-10-CM | POA: Diagnosis not present

## 2015-08-18 NOTE — Telephone Encounter (Signed)
Noted  

## 2015-08-18 NOTE — Telephone Encounter (Signed)
Dr. Inocente Salles returned the call.  Patient had a CPE 01/29/15.  States that patient doesn't need a physical until 2017.  Advised that we are happy to see the patient on Wednesday 11/16 if she needs to be seen otherwise.  Dr. Inocente Salles checked with patient; patient states that she is great and doesn't need to be seen.  They are going to [facility] and will be back in April.  Dr. Inocente Salles states she can have her CPE at that time.

## 2015-08-18 NOTE — Telephone Encounter (Signed)
LMOM for Dr. Inocente Salles to call me to provide date/time to bring patient in on 11/16 for CPE.

## 2015-08-20 DIAGNOSIS — F3341 Major depressive disorder, recurrent, in partial remission: Secondary | ICD-10-CM | POA: Diagnosis not present

## 2015-09-05 DIAGNOSIS — K219 Gastro-esophageal reflux disease without esophagitis: Secondary | ICD-10-CM | POA: Diagnosis not present

## 2015-09-05 DIAGNOSIS — I10 Essential (primary) hypertension: Secondary | ICD-10-CM | POA: Diagnosis not present

## 2015-09-05 DIAGNOSIS — F419 Anxiety disorder, unspecified: Secondary | ICD-10-CM | POA: Diagnosis not present

## 2015-09-05 DIAGNOSIS — F332 Major depressive disorder, recurrent severe without psychotic features: Secondary | ICD-10-CM | POA: Diagnosis not present

## 2015-09-05 DIAGNOSIS — E785 Hyperlipidemia, unspecified: Secondary | ICD-10-CM | POA: Diagnosis not present

## 2015-09-05 DIAGNOSIS — M810 Age-related osteoporosis without current pathological fracture: Secondary | ICD-10-CM | POA: Diagnosis not present

## 2015-09-05 DIAGNOSIS — M1990 Unspecified osteoarthritis, unspecified site: Secondary | ICD-10-CM | POA: Diagnosis not present

## 2015-09-05 DIAGNOSIS — E039 Hypothyroidism, unspecified: Secondary | ICD-10-CM | POA: Diagnosis not present

## 2015-10-07 DIAGNOSIS — Z79899 Other long term (current) drug therapy: Secondary | ICD-10-CM | POA: Diagnosis not present

## 2015-10-07 DIAGNOSIS — Z888 Allergy status to other drugs, medicaments and biological substances status: Secondary | ICD-10-CM | POA: Diagnosis not present

## 2015-10-07 DIAGNOSIS — Z87891 Personal history of nicotine dependence: Secondary | ICD-10-CM | POA: Diagnosis not present

## 2015-10-07 DIAGNOSIS — F339 Major depressive disorder, recurrent, unspecified: Secondary | ICD-10-CM | POA: Diagnosis not present

## 2015-10-07 DIAGNOSIS — K219 Gastro-esophageal reflux disease without esophagitis: Secondary | ICD-10-CM | POA: Diagnosis not present

## 2015-10-07 DIAGNOSIS — F332 Major depressive disorder, recurrent severe without psychotic features: Secondary | ICD-10-CM | POA: Diagnosis not present

## 2015-10-07 DIAGNOSIS — E039 Hypothyroidism, unspecified: Secondary | ICD-10-CM | POA: Diagnosis not present

## 2015-10-07 DIAGNOSIS — I10 Essential (primary) hypertension: Secondary | ICD-10-CM | POA: Diagnosis not present

## 2015-10-07 DIAGNOSIS — M810 Age-related osteoporosis without current pathological fracture: Secondary | ICD-10-CM | POA: Diagnosis not present

## 2015-10-07 DIAGNOSIS — M1990 Unspecified osteoarthritis, unspecified site: Secondary | ICD-10-CM | POA: Diagnosis not present

## 2015-10-07 DIAGNOSIS — I6523 Occlusion and stenosis of bilateral carotid arteries: Secondary | ICD-10-CM | POA: Diagnosis not present

## 2015-10-07 DIAGNOSIS — N3281 Overactive bladder: Secondary | ICD-10-CM | POA: Diagnosis not present

## 2015-10-07 DIAGNOSIS — F419 Anxiety disorder, unspecified: Secondary | ICD-10-CM | POA: Diagnosis not present

## 2015-10-07 DIAGNOSIS — Z7982 Long term (current) use of aspirin: Secondary | ICD-10-CM | POA: Diagnosis not present

## 2015-10-07 DIAGNOSIS — E785 Hyperlipidemia, unspecified: Secondary | ICD-10-CM | POA: Diagnosis not present

## 2015-10-08 DIAGNOSIS — F3341 Major depressive disorder, recurrent, in partial remission: Secondary | ICD-10-CM | POA: Diagnosis not present

## 2015-10-10 DIAGNOSIS — E039 Hypothyroidism, unspecified: Secondary | ICD-10-CM | POA: Diagnosis not present

## 2015-10-10 DIAGNOSIS — F419 Anxiety disorder, unspecified: Secondary | ICD-10-CM | POA: Diagnosis not present

## 2015-10-10 DIAGNOSIS — M1990 Unspecified osteoarthritis, unspecified site: Secondary | ICD-10-CM | POA: Diagnosis not present

## 2015-10-10 DIAGNOSIS — E785 Hyperlipidemia, unspecified: Secondary | ICD-10-CM | POA: Diagnosis not present

## 2015-10-10 DIAGNOSIS — I10 Essential (primary) hypertension: Secondary | ICD-10-CM | POA: Diagnosis not present

## 2015-10-10 DIAGNOSIS — F332 Major depressive disorder, recurrent severe without psychotic features: Secondary | ICD-10-CM | POA: Diagnosis not present

## 2015-10-10 DIAGNOSIS — M810 Age-related osteoporosis without current pathological fracture: Secondary | ICD-10-CM | POA: Diagnosis not present

## 2015-10-10 DIAGNOSIS — K219 Gastro-esophageal reflux disease without esophagitis: Secondary | ICD-10-CM | POA: Diagnosis not present

## 2015-10-13 DIAGNOSIS — E039 Hypothyroidism, unspecified: Secondary | ICD-10-CM | POA: Diagnosis not present

## 2015-10-13 DIAGNOSIS — E785 Hyperlipidemia, unspecified: Secondary | ICD-10-CM | POA: Diagnosis not present

## 2015-10-13 DIAGNOSIS — F332 Major depressive disorder, recurrent severe without psychotic features: Secondary | ICD-10-CM | POA: Diagnosis not present

## 2015-10-13 DIAGNOSIS — I10 Essential (primary) hypertension: Secondary | ICD-10-CM | POA: Diagnosis not present

## 2015-10-13 DIAGNOSIS — K219 Gastro-esophageal reflux disease without esophagitis: Secondary | ICD-10-CM | POA: Diagnosis not present

## 2015-10-13 DIAGNOSIS — M810 Age-related osteoporosis without current pathological fracture: Secondary | ICD-10-CM | POA: Diagnosis not present

## 2015-10-13 DIAGNOSIS — M1990 Unspecified osteoarthritis, unspecified site: Secondary | ICD-10-CM | POA: Diagnosis not present

## 2015-10-13 DIAGNOSIS — Z87891 Personal history of nicotine dependence: Secondary | ICD-10-CM | POA: Diagnosis not present

## 2015-10-13 DIAGNOSIS — F419 Anxiety disorder, unspecified: Secondary | ICD-10-CM | POA: Diagnosis not present

## 2015-10-21 DIAGNOSIS — M7071 Other bursitis of hip, right hip: Secondary | ICD-10-CM | POA: Diagnosis not present

## 2015-10-21 DIAGNOSIS — M4806 Spinal stenosis, lumbar region: Secondary | ICD-10-CM | POA: Diagnosis not present

## 2015-10-28 DIAGNOSIS — M7071 Other bursitis of hip, right hip: Secondary | ICD-10-CM | POA: Diagnosis not present

## 2015-10-28 DIAGNOSIS — M545 Low back pain: Secondary | ICD-10-CM | POA: Diagnosis not present

## 2015-10-28 DIAGNOSIS — M4806 Spinal stenosis, lumbar region: Secondary | ICD-10-CM | POA: Diagnosis not present

## 2015-10-28 DIAGNOSIS — M4697 Unspecified inflammatory spondylopathy, lumbosacral region: Secondary | ICD-10-CM | POA: Diagnosis not present

## 2015-11-04 DIAGNOSIS — M25451 Effusion, right hip: Secondary | ICD-10-CM | POA: Diagnosis not present

## 2015-11-04 DIAGNOSIS — M7061 Trochanteric bursitis, right hip: Secondary | ICD-10-CM | POA: Diagnosis not present

## 2015-11-04 DIAGNOSIS — M7601 Gluteal tendinitis, right hip: Secondary | ICD-10-CM | POA: Diagnosis not present

## 2015-11-04 DIAGNOSIS — M1611 Unilateral primary osteoarthritis, right hip: Secondary | ICD-10-CM | POA: Diagnosis not present

## 2015-11-06 DIAGNOSIS — F3341 Major depressive disorder, recurrent, in partial remission: Secondary | ICD-10-CM | POA: Diagnosis not present

## 2015-11-11 DIAGNOSIS — G459 Transient cerebral ischemic attack, unspecified: Secondary | ICD-10-CM | POA: Diagnosis not present

## 2015-11-11 DIAGNOSIS — I083 Combined rheumatic disorders of mitral, aortic and tricuspid valves: Secondary | ICD-10-CM | POA: Diagnosis not present

## 2015-11-11 DIAGNOSIS — E871 Hypo-osmolality and hyponatremia: Secondary | ICD-10-CM | POA: Diagnosis not present

## 2015-11-11 DIAGNOSIS — R4701 Aphasia: Secondary | ICD-10-CM | POA: Diagnosis not present

## 2015-11-11 DIAGNOSIS — R251 Tremor, unspecified: Secondary | ICD-10-CM | POA: Diagnosis not present

## 2015-11-11 DIAGNOSIS — R479 Unspecified speech disturbances: Secondary | ICD-10-CM | POA: Diagnosis not present

## 2015-11-11 DIAGNOSIS — I771 Stricture of artery: Secondary | ICD-10-CM | POA: Diagnosis not present

## 2015-11-11 DIAGNOSIS — R29818 Other symptoms and signs involving the nervous system: Secondary | ICD-10-CM | POA: Diagnosis not present

## 2015-11-11 DIAGNOSIS — E876 Hypokalemia: Secondary | ICD-10-CM | POA: Diagnosis not present

## 2015-11-11 DIAGNOSIS — E785 Hyperlipidemia, unspecified: Secondary | ICD-10-CM | POA: Diagnosis not present

## 2015-11-11 DIAGNOSIS — E039 Hypothyroidism, unspecified: Secondary | ICD-10-CM | POA: Diagnosis not present

## 2015-11-11 DIAGNOSIS — I10 Essential (primary) hypertension: Secondary | ICD-10-CM | POA: Diagnosis not present

## 2015-11-11 DIAGNOSIS — F319 Bipolar disorder, unspecified: Secondary | ICD-10-CM | POA: Diagnosis not present

## 2015-11-11 DIAGNOSIS — Z79899 Other long term (current) drug therapy: Secondary | ICD-10-CM | POA: Diagnosis not present

## 2015-11-11 DIAGNOSIS — Z87891 Personal history of nicotine dependence: Secondary | ICD-10-CM | POA: Diagnosis not present

## 2015-11-12 DIAGNOSIS — I083 Combined rheumatic disorders of mitral, aortic and tricuspid valves: Secondary | ICD-10-CM | POA: Diagnosis not present

## 2015-11-12 DIAGNOSIS — I10 Essential (primary) hypertension: Secondary | ICD-10-CM | POA: Diagnosis not present

## 2015-11-12 DIAGNOSIS — I771 Stricture of artery: Secondary | ICD-10-CM | POA: Diagnosis not present

## 2015-11-12 DIAGNOSIS — I6523 Occlusion and stenosis of bilateral carotid arteries: Secondary | ICD-10-CM | POA: Diagnosis not present

## 2015-11-12 DIAGNOSIS — G459 Transient cerebral ischemic attack, unspecified: Secondary | ICD-10-CM | POA: Diagnosis not present

## 2015-11-13 DIAGNOSIS — I771 Stricture of artery: Secondary | ICD-10-CM | POA: Diagnosis not present

## 2015-11-13 DIAGNOSIS — G459 Transient cerebral ischemic attack, unspecified: Secondary | ICD-10-CM | POA: Diagnosis not present

## 2015-11-13 DIAGNOSIS — E785 Hyperlipidemia, unspecified: Secondary | ICD-10-CM | POA: Diagnosis not present

## 2015-11-13 DIAGNOSIS — I083 Combined rheumatic disorders of mitral, aortic and tricuspid valves: Secondary | ICD-10-CM | POA: Diagnosis not present

## 2015-11-19 ENCOUNTER — Telehealth: Payer: Self-pay | Admitting: Internal Medicine

## 2015-11-19 ENCOUNTER — Telehealth (INDEPENDENT_AMBULATORY_CARE_PROVIDER_SITE_OTHER): Payer: Medicare Other

## 2015-11-19 DIAGNOSIS — R300 Dysuria: Secondary | ICD-10-CM | POA: Diagnosis not present

## 2015-11-19 DIAGNOSIS — R3915 Urgency of urination: Secondary | ICD-10-CM

## 2015-11-19 LAB — POCT URINALYSIS DIPSTICK
BILIRUBIN UA: NEGATIVE
Glucose, UA: NEGATIVE
KETONES UA: NEGATIVE
LEUKOCYTES UA: NEGATIVE
Nitrite, UA: NEGATIVE
PH UA: 7.5
Protein, UA: NEGATIVE
RBC UA: NEGATIVE
SPEC GRAV UA: 1.01
Urobilinogen, UA: 0.2

## 2015-11-19 NOTE — Telephone Encounter (Signed)
Per Dr. Renold Genta, speak with Randol Kern, RN and ask her to come and pick up specimen cup and have patient provide UA and return it to our office.    [name] will provide set of vitals and return the specimen to [name] today and we'll do a dipstick and go from there.

## 2015-11-19 NOTE — Telephone Encounter (Signed)
Private duty nurse, [place] calling; states patient has returned from [facility].  She is severely depressed.  They are trying to get her to [facility] ASAP.  She is pretty "out of it".  Meghan Oliver states that she has symptoms of UTI.  Wants to know if they can come in and give a UA.  Advised that you have been out of the office sick.  Advised that I would provide a message to you and get back to her.    Please advise.

## 2015-11-19 NOTE — Telephone Encounter (Signed)
Results given to Dr. Renold Genta and she and [name] are in agreement that they will just let this play out.

## 2015-11-19 NOTE — Telephone Encounter (Signed)
[name] brought in urine; results are normal. BP 164/78   58 R    18

## 2015-11-20 DIAGNOSIS — F339 Major depressive disorder, recurrent, unspecified: Secondary | ICD-10-CM | POA: Diagnosis not present

## 2015-11-20 DIAGNOSIS — K219 Gastro-esophageal reflux disease without esophagitis: Secondary | ICD-10-CM | POA: Diagnosis not present

## 2015-11-20 DIAGNOSIS — I10 Essential (primary) hypertension: Secondary | ICD-10-CM | POA: Diagnosis not present

## 2015-11-20 DIAGNOSIS — F314 Bipolar disorder, current episode depressed, severe, without psychotic features: Secondary | ICD-10-CM | POA: Diagnosis not present

## 2015-11-20 DIAGNOSIS — E039 Hypothyroidism, unspecified: Secondary | ICD-10-CM | POA: Diagnosis not present

## 2015-11-20 DIAGNOSIS — F332 Major depressive disorder, recurrent severe without psychotic features: Secondary | ICD-10-CM | POA: Diagnosis not present

## 2015-11-20 DIAGNOSIS — E784 Other hyperlipidemia: Secondary | ICD-10-CM | POA: Diagnosis not present

## 2015-11-20 DIAGNOSIS — G4730 Sleep apnea, unspecified: Secondary | ICD-10-CM | POA: Diagnosis not present

## 2015-11-20 DIAGNOSIS — R634 Abnormal weight loss: Secondary | ICD-10-CM | POA: Diagnosis not present

## 2015-11-21 DIAGNOSIS — Z888 Allergy status to other drugs, medicaments and biological substances status: Secondary | ICD-10-CM | POA: Diagnosis not present

## 2015-11-21 DIAGNOSIS — Z79899 Other long term (current) drug therapy: Secondary | ICD-10-CM | POA: Diagnosis not present

## 2015-11-21 DIAGNOSIS — Z7952 Long term (current) use of systemic steroids: Secondary | ICD-10-CM | POA: Diagnosis not present

## 2015-11-21 DIAGNOSIS — F339 Major depressive disorder, recurrent, unspecified: Secondary | ICD-10-CM | POA: Diagnosis not present

## 2015-11-21 DIAGNOSIS — Z7982 Long term (current) use of aspirin: Secondary | ICD-10-CM | POA: Diagnosis not present

## 2015-11-21 DIAGNOSIS — F329 Major depressive disorder, single episode, unspecified: Secondary | ICD-10-CM | POA: Diagnosis not present

## 2015-11-21 DIAGNOSIS — Z79891 Long term (current) use of opiate analgesic: Secondary | ICD-10-CM | POA: Diagnosis not present

## 2015-11-24 DIAGNOSIS — F339 Major depressive disorder, recurrent, unspecified: Secondary | ICD-10-CM | POA: Diagnosis not present

## 2015-11-24 DIAGNOSIS — F40240 Claustrophobia: Secondary | ICD-10-CM | POA: Diagnosis not present

## 2015-11-24 DIAGNOSIS — F338 Other recurrent depressive disorders: Secondary | ICD-10-CM | POA: Diagnosis not present

## 2015-11-24 DIAGNOSIS — G8929 Other chronic pain: Secondary | ICD-10-CM | POA: Diagnosis not present

## 2015-11-24 DIAGNOSIS — E039 Hypothyroidism, unspecified: Secondary | ICD-10-CM | POA: Diagnosis not present

## 2015-11-24 DIAGNOSIS — E785 Hyperlipidemia, unspecified: Secondary | ICD-10-CM | POA: Diagnosis not present

## 2015-11-24 DIAGNOSIS — Z780 Asymptomatic menopausal state: Secondary | ICD-10-CM | POA: Diagnosis not present

## 2015-11-24 DIAGNOSIS — I10 Essential (primary) hypertension: Secondary | ICD-10-CM | POA: Diagnosis not present

## 2015-11-24 DIAGNOSIS — M25551 Pain in right hip: Secondary | ICD-10-CM | POA: Diagnosis not present

## 2015-11-24 DIAGNOSIS — K5909 Other constipation: Secondary | ICD-10-CM | POA: Diagnosis not present

## 2015-11-26 DIAGNOSIS — F339 Major depressive disorder, recurrent, unspecified: Secondary | ICD-10-CM | POA: Diagnosis not present

## 2015-11-27 ENCOUNTER — Ambulatory Visit (INDEPENDENT_AMBULATORY_CARE_PROVIDER_SITE_OTHER): Payer: Medicare Other | Admitting: Internal Medicine

## 2015-11-27 ENCOUNTER — Encounter: Payer: Self-pay | Admitting: Internal Medicine

## 2015-11-27 VITALS — BP 150/78 | HR 78 | Temp 99.3°F | Resp 20 | Ht 60.0 in | Wt 115.0 lb

## 2015-11-27 DIAGNOSIS — L282 Other prurigo: Secondary | ICD-10-CM

## 2015-11-27 DIAGNOSIS — R3915 Urgency of urination: Secondary | ICD-10-CM | POA: Diagnosis not present

## 2015-11-27 LAB — POCT URINALYSIS DIPSTICK
BILIRUBIN UA: NEGATIVE
Glucose, UA: NEGATIVE
Ketones, UA: NEGATIVE
LEUKOCYTES UA: NEGATIVE
Nitrite, UA: NEGATIVE
PH UA: 6.5
PROTEIN UA: NEGATIVE
RBC UA: NEGATIVE
SPEC GRAV UA: 1.01
Urobilinogen, UA: 0.2

## 2015-11-28 DIAGNOSIS — Z79899 Other long term (current) drug therapy: Secondary | ICD-10-CM | POA: Diagnosis not present

## 2015-11-28 DIAGNOSIS — F3340 Major depressive disorder, recurrent, in remission, unspecified: Secondary | ICD-10-CM | POA: Diagnosis not present

## 2015-11-28 DIAGNOSIS — I10 Essential (primary) hypertension: Secondary | ICD-10-CM | POA: Diagnosis not present

## 2015-11-28 DIAGNOSIS — F40240 Claustrophobia: Secondary | ICD-10-CM | POA: Diagnosis not present

## 2015-11-28 DIAGNOSIS — F339 Major depressive disorder, recurrent, unspecified: Secondary | ICD-10-CM | POA: Diagnosis not present

## 2015-11-28 DIAGNOSIS — F338 Other recurrent depressive disorders: Secondary | ICD-10-CM | POA: Diagnosis not present

## 2015-11-30 NOTE — Progress Notes (Signed)
   Subjective:    Patient ID: Meghan Oliver, female    DOB: December 30, 1940, 75 y.o.   MRN: [id number]  HPI  Meghan Oliver has been receiving outpatient ECT therapy at [name] over the past week. She has her final treatment tomorrow. Recently after receiving a treatment she broke out in a nonspecific rash. Rash was on right forehead. It was not where ECT leads were placed according to her nurse, [name]. She also has erythematous itchy macular papular area on her right arm as well. Conservative treatment is been tried including Benadryl and some topical steroid cream. Patient is complaining bitterly of itching. Her affect today is flat. Inpatient therapy is being considered and patient is not wanting to do this.  Patient also was having some urinary urgency. We did a recent outpatient urine specimen that [name] brought to our office after collecting it for Mrs. Whelchel at her home. It was normal. Repeat specimen today is also normal. She is not consuming excess fluids according to nurse.    Review of Systems     Objective:   Physical Exam  Areas on right forearm macular papular and erythematous. Looks a bit urticarial but not frank times. Similar lesion right forehead.      Assessment & Plan:  Possible contact dermatitis  Possible urticaria  Pruritic rash  Plan: Tapering dose of prednisone going from 40 mg to 0 mg over 5 days.

## 2015-12-01 ENCOUNTER — Encounter: Payer: Self-pay | Admitting: Internal Medicine

## 2015-12-01 ENCOUNTER — Encounter: Payer: Self-pay | Admitting: Neurology

## 2015-12-01 DIAGNOSIS — Z79899 Other long term (current) drug therapy: Secondary | ICD-10-CM | POA: Diagnosis not present

## 2015-12-01 DIAGNOSIS — Z7952 Long term (current) use of systemic steroids: Secondary | ICD-10-CM | POA: Diagnosis not present

## 2015-12-01 DIAGNOSIS — F338 Other recurrent depressive disorders: Secondary | ICD-10-CM | POA: Diagnosis not present

## 2015-12-01 DIAGNOSIS — F339 Major depressive disorder, recurrent, unspecified: Secondary | ICD-10-CM | POA: Diagnosis not present

## 2015-12-01 DIAGNOSIS — Z7982 Long term (current) use of aspirin: Secondary | ICD-10-CM | POA: Diagnosis not present

## 2015-12-01 NOTE — Patient Instructions (Signed)
Urine specimen checked today is within normal limits. Take prednisone in tapering course as directed. It was a pleasure to see you today. I hope you feel better.

## 2015-12-02 ENCOUNTER — Other Ambulatory Visit: Payer: Self-pay

## 2015-12-02 ENCOUNTER — Telehealth: Payer: Self-pay

## 2015-12-02 ENCOUNTER — Telehealth: Payer: Self-pay | Admitting: Cardiovascular Disease

## 2015-12-02 MED ORDER — OMEPRAZOLE 40 MG PO CPDR: DELAYED_RELEASE_CAPSULE | ORAL | Status: DC

## 2015-12-02 MED ORDER — PRIMIDONE 50 MG PO TABS: ORAL_TABLET | ORAL | Status: DC

## 2015-12-02 MED ORDER — AMLODIPINE BESYLATE 10 MG PO TABS: 10.0000 mg | ORAL_TABLET | Freq: Every day | ORAL | Status: DC

## 2015-12-02 MED ORDER — ROSUVASTATIN CALCIUM 20 MG PO TABS: 20.0000 mg | ORAL_TABLET | Freq: Every day | ORAL | Status: DC

## 2015-12-02 MED ORDER — CELECOXIB 200 MG PO CAPS: ORAL_CAPSULE | ORAL | Status: DC

## 2015-12-02 MED ORDER — CHLORTHALIDONE 25 MG PO TABS: ORAL_TABLET | ORAL | Status: DC

## 2015-12-02 MED ORDER — LOSARTAN POTASSIUM 100 MG PO TABS: ORAL_TABLET | ORAL | Status: DC

## 2015-12-02 MED ORDER — LEVOTHYROXINE SODIUM 75 MCG PO TABS: 75.0000 ug | ORAL_TABLET | Freq: Every day | ORAL | Status: DC

## 2015-12-02 NOTE — Telephone Encounter (Signed)
Please call [name],Concerning [name].

## 2015-12-02 NOTE — Telephone Encounter (Signed)
May overbook at 12:45 or 4:15 on any day I am in clinic, would like to see her in April or May.

## 2015-12-02 NOTE — Telephone Encounter (Signed)
Per [name] prescription needed

## 2015-12-02 NOTE — Telephone Encounter (Signed)
Left message to call back  

## 2015-12-02 NOTE — Telephone Encounter (Signed)
I spoke with Meghan Oliver and she called to schedule a follow-up appointment for the pt to see Dr Aundra Dubin at the end of April beginning of May.  Currently he does not have any open slots and I advised Meghan Oliver that I will touch base with Dr Aundra Dubin and make sure that he is okay with scheduling follow-up during time frame requested.  I will contact [name] with appointment once approved by Dr Aundra Dubin.

## 2015-12-02 NOTE — Telephone Encounter (Signed)
Per [name] all medications needed

## 2015-12-03 DIAGNOSIS — F338 Other recurrent depressive disorders: Secondary | ICD-10-CM | POA: Diagnosis not present

## 2015-12-03 DIAGNOSIS — F339 Major depressive disorder, recurrent, unspecified: Secondary | ICD-10-CM | POA: Diagnosis not present

## 2015-12-03 DIAGNOSIS — F3340 Major depressive disorder, recurrent, in remission, unspecified: Secondary | ICD-10-CM | POA: Diagnosis not present

## 2015-12-03 DIAGNOSIS — Z87891 Personal history of nicotine dependence: Secondary | ICD-10-CM | POA: Diagnosis not present

## 2015-12-03 DIAGNOSIS — Z79899 Other long term (current) drug therapy: Secondary | ICD-10-CM | POA: Diagnosis not present

## 2015-12-03 NOTE — Telephone Encounter (Signed)
I spoke with Meghan Oliver and have scheduled the pt to see Dr Aundra Dubin on 02/16/16 at 12:45.

## 2015-12-05 DIAGNOSIS — F3340 Major depressive disorder, recurrent, in remission, unspecified: Secondary | ICD-10-CM | POA: Diagnosis not present

## 2015-12-05 DIAGNOSIS — F338 Other recurrent depressive disorders: Secondary | ICD-10-CM | POA: Diagnosis not present

## 2015-12-05 DIAGNOSIS — Z87891 Personal history of nicotine dependence: Secondary | ICD-10-CM | POA: Diagnosis not present

## 2015-12-05 DIAGNOSIS — F339 Major depressive disorder, recurrent, unspecified: Secondary | ICD-10-CM | POA: Diagnosis not present

## 2015-12-08 DIAGNOSIS — F332 Major depressive disorder, recurrent severe without psychotic features: Secondary | ICD-10-CM | POA: Diagnosis not present

## 2015-12-10 DIAGNOSIS — F332 Major depressive disorder, recurrent severe without psychotic features: Secondary | ICD-10-CM | POA: Diagnosis not present

## 2015-12-12 DIAGNOSIS — Z87891 Personal history of nicotine dependence: Secondary | ICD-10-CM | POA: Diagnosis not present

## 2015-12-12 DIAGNOSIS — F332 Major depressive disorder, recurrent severe without psychotic features: Secondary | ICD-10-CM | POA: Diagnosis not present

## 2015-12-15 DIAGNOSIS — K5909 Other constipation: Secondary | ICD-10-CM | POA: Diagnosis not present

## 2015-12-15 DIAGNOSIS — R634 Abnormal weight loss: Secondary | ICD-10-CM | POA: Diagnosis not present

## 2015-12-15 DIAGNOSIS — F332 Major depressive disorder, recurrent severe without psychotic features: Secondary | ICD-10-CM | POA: Diagnosis not present

## 2015-12-15 DIAGNOSIS — Z780 Asymptomatic menopausal state: Secondary | ICD-10-CM | POA: Diagnosis not present

## 2015-12-15 DIAGNOSIS — M6250 Muscle wasting and atrophy, not elsewhere classified, unspecified site: Secondary | ICD-10-CM | POA: Diagnosis not present

## 2015-12-15 DIAGNOSIS — Z7982 Long term (current) use of aspirin: Secondary | ICD-10-CM | POA: Diagnosis not present

## 2015-12-15 DIAGNOSIS — E785 Hyperlipidemia, unspecified: Secondary | ICD-10-CM | POA: Diagnosis not present

## 2015-12-15 DIAGNOSIS — Z79899 Other long term (current) drug therapy: Secondary | ICD-10-CM | POA: Diagnosis not present

## 2015-12-15 DIAGNOSIS — G8929 Other chronic pain: Secondary | ICD-10-CM | POA: Diagnosis not present

## 2015-12-15 DIAGNOSIS — F40240 Claustrophobia: Secondary | ICD-10-CM | POA: Diagnosis not present

## 2015-12-15 DIAGNOSIS — M25559 Pain in unspecified hip: Secondary | ICD-10-CM | POA: Diagnosis not present

## 2015-12-15 DIAGNOSIS — R262 Difficulty in walking, not elsewhere classified: Secondary | ICD-10-CM | POA: Diagnosis not present

## 2015-12-15 DIAGNOSIS — E039 Hypothyroidism, unspecified: Secondary | ICD-10-CM | POA: Diagnosis not present

## 2015-12-15 DIAGNOSIS — G4730 Sleep apnea, unspecified: Secondary | ICD-10-CM | POA: Diagnosis not present

## 2015-12-15 DIAGNOSIS — Z87891 Personal history of nicotine dependence: Secondary | ICD-10-CM | POA: Diagnosis not present

## 2015-12-15 DIAGNOSIS — I10 Essential (primary) hypertension: Secondary | ICD-10-CM | POA: Diagnosis not present

## 2015-12-17 DIAGNOSIS — Z7952 Long term (current) use of systemic steroids: Secondary | ICD-10-CM | POA: Diagnosis not present

## 2015-12-17 DIAGNOSIS — Z79899 Other long term (current) drug therapy: Secondary | ICD-10-CM | POA: Diagnosis not present

## 2015-12-17 DIAGNOSIS — F332 Major depressive disorder, recurrent severe without psychotic features: Secondary | ICD-10-CM | POA: Diagnosis not present

## 2015-12-17 DIAGNOSIS — Z888 Allergy status to other drugs, medicaments and biological substances status: Secondary | ICD-10-CM | POA: Diagnosis not present

## 2015-12-17 DIAGNOSIS — Z7982 Long term (current) use of aspirin: Secondary | ICD-10-CM | POA: Diagnosis not present

## 2015-12-17 DIAGNOSIS — Z87891 Personal history of nicotine dependence: Secondary | ICD-10-CM | POA: Diagnosis not present

## 2015-12-19 DIAGNOSIS — F332 Major depressive disorder, recurrent severe without psychotic features: Secondary | ICD-10-CM | POA: Diagnosis not present

## 2015-12-22 DIAGNOSIS — F332 Major depressive disorder, recurrent severe without psychotic features: Secondary | ICD-10-CM | POA: Diagnosis not present

## 2015-12-26 DIAGNOSIS — F332 Major depressive disorder, recurrent severe without psychotic features: Secondary | ICD-10-CM | POA: Diagnosis not present

## 2015-12-26 DIAGNOSIS — Z87891 Personal history of nicotine dependence: Secondary | ICD-10-CM | POA: Diagnosis not present

## 2015-12-29 DIAGNOSIS — I10 Essential (primary) hypertension: Secondary | ICD-10-CM | POA: Diagnosis not present

## 2015-12-29 DIAGNOSIS — F332 Major depressive disorder, recurrent severe without psychotic features: Secondary | ICD-10-CM | POA: Diagnosis not present

## 2015-12-29 DIAGNOSIS — Z5309 Procedure and treatment not carried out because of other contraindication: Secondary | ICD-10-CM | POA: Diagnosis not present

## 2015-12-31 ENCOUNTER — Telehealth: Payer: Self-pay | Admitting: Gynecology

## 2015-12-31 DIAGNOSIS — Z87891 Personal history of nicotine dependence: Secondary | ICD-10-CM | POA: Diagnosis not present

## 2015-12-31 DIAGNOSIS — F332 Major depressive disorder, recurrent severe without psychotic features: Secondary | ICD-10-CM | POA: Diagnosis not present

## 2015-12-31 NOTE — Telephone Encounter (Signed)
Prolia due after 01/07/2016 , Calcium 9.6  01/29/15.  Prolia checking benefits/   Senile osteopenia M89.9

## 2016-01-05 ENCOUNTER — Encounter: Payer: Self-pay | Admitting: Cardiology

## 2016-01-05 ENCOUNTER — Ambulatory Visit (INDEPENDENT_AMBULATORY_CARE_PROVIDER_SITE_OTHER): Payer: Medicare Other | Admitting: Cardiology

## 2016-01-05 VITALS — BP 134/62 | HR 57 | Ht 60.0 in | Wt 118.0 lb

## 2016-01-05 DIAGNOSIS — E785 Hyperlipidemia, unspecified: Secondary | ICD-10-CM | POA: Diagnosis not present

## 2016-01-05 DIAGNOSIS — I10 Essential (primary) hypertension: Secondary | ICD-10-CM | POA: Diagnosis not present

## 2016-01-05 DIAGNOSIS — I2510 Atherosclerotic heart disease of native coronary artery without angina pectoris: Secondary | ICD-10-CM

## 2016-01-05 LAB — BASIC METABOLIC PANEL
BUN: 16 mg/dL (ref 7–25)
CHLORIDE: 96 mmol/L — AB (ref 98–110)
CO2: 27 mmol/L (ref 20–31)
Calcium: 9 mg/dL (ref 8.6–10.4)
Creat: 0.81 mg/dL (ref 0.60–0.93)
Glucose, Bld: 105 mg/dL — ABNORMAL HIGH (ref 65–99)
Potassium: 3.3 mmol/L — ABNORMAL LOW (ref 3.5–5.3)
Sodium: 133 mmol/L — ABNORMAL LOW (ref 135–146)

## 2016-01-05 LAB — CBC WITH DIFFERENTIAL/PLATELET
BASOS ABS: 0 {cells}/uL (ref 0–200)
Basophils Relative: 0 %
EOS ABS: 75 {cells}/uL (ref 15–500)
Eosinophils Relative: 1 %
HCT: 35.6 % (ref 35.0–45.0)
HEMOGLOBIN: 11.9 g/dL (ref 11.7–15.5)
LYMPHS ABS: 1875 {cells}/uL (ref 850–3900)
Lymphocytes Relative: 25 %
MCH: 30.2 pg (ref 27.0–33.0)
MCHC: 33.4 g/dL (ref 32.0–36.0)
MCV: 90.4 fL (ref 80.0–100.0)
MONOS PCT: 11 %
MPV: 9.5 fL (ref 7.5–12.5)
Monocytes Absolute: 825 cells/uL (ref 200–950)
NEUTROS ABS: 4725 {cells}/uL (ref 1500–7800)
Neutrophils Relative %: 63 %
Platelets: 289 10*3/uL (ref 140–400)
RBC: 3.94 MIL/uL (ref 3.80–5.10)
RDW: 13.2 % (ref 11.0–15.0)
WBC: 7.5 10*3/uL (ref 3.8–10.8)

## 2016-01-05 LAB — TSH: TSH: 0.78 mIU/L

## 2016-01-05 LAB — LIPID PANEL
CHOLESTEROL: 196 mg/dL (ref 125–200)
HDL: 118 mg/dL (ref >=46)
LDL Cholesterol: 65 mg/dL (ref <130)
TRIGLYCERIDES: 66 mg/dL (ref <150)
Total CHOL/HDL Ratio: 1.7 Ratio (ref <=5.0)
VLDL: 13 mg/dL (ref <30)

## 2016-01-05 MED ORDER — OLMESARTAN MEDOXOMIL 20 MG PO TABS: 20.0000 mg | ORAL_TABLET | Freq: Every day | ORAL | Status: DC

## 2016-01-05 NOTE — Progress Notes (Signed)
Patient ID: Meghan Oliver, female   DOB: 05-Apr-1941, 75 y.o.   MRN: [id number] PCP: Dr. Renold Genta  75 y.o with history of bipolar disorder, carotid stenosis, HTN and hyperlipidemia presents for cardiology followup.  Last echo in 12/14 showed normal EF, mild AI and very mild AS.  Cardiolite in 7/16 showed EF 76% with no ischemia/infarction.  Currently, she has been having a severe bout with depression and is getting ECT three times a week at [facility]. SBP at home 130s-150s.  BP has been up to the 200s when she goes for ECT, this has been an issue with treatment.  She has had no chest pain.  She does have dyspnea walking up hills.    ECG: NSR, septal Qs  Labs (4/16): K 3.3, creatinine 0.76  PMH: 1. GERD 2. Bipolar disorder: On lithium. History of ECT.  3. HTN 4. Tremor 5. Hyperlipidemia 6. Carotid stenosis: Carotid dopplers (7/14) with 40-59% bilateral ICA stenosis.  Carotid dopplers (7/15) with 40-59% BICA stenosis.  7. CAD: Coronary calcium seen on CT.   - ETT (7/13) with 5'45" exercise, 71% MPHR (wanted to stop), no ischemic ECG changes but did not reach target heart rate.  - Lexiscan Cardiolite (4/16) with EF 76%, no ischemia/infarction.  8. Echo (2/12) with EF 55-60%, mild AI and MR, PA systolic pressure 36 mmHg.  Echo (12/14) with EF 65-70%, mild LVH, mild AI, very mild AS, PA systolic pressure 44 mmHg.  9. Lung nodules: CT chest 12/14 stable, no followup recommended.  10. Bradycardia: asymptomatic  SH: Married to [name], quit smoking in 1972, 2 daughters.  FH: Father with CAD.  ROS: All systems reviewed and negative except as per HPI.   Current Outpatient Prescriptions  Medication Sig Dispense Refill  . amLODipine (NORVASC) 10 MG tablet Take 1 tablet (10 mg total) by mouth daily. 90 tablet 3  . aspirin 81 MG tablet Take 81 mg by mouth daily.      . Calcium Carbonate-Vitamin D (CALCIUM 600/VITAMIN D PO) Take 1 tablet by mouth 2 (two) times daily.     . celecoxib (CELEBREX) 200 MG  capsule Take 1 tablet daily 90 capsule 3  . cetirizine (ZYRTEC) 10 MG tablet Take 10 mg by mouth 3 times/day as needed-between meals & bedtime.     . chlorthalidone (HYGROTON) 25 MG tablet Take 1 tablet daily 90 tablet 0  . Cholecalciferol (VITAMIN D3) 2000 UNITS TABS Take 2 tablets by mouth daily.     Marland Kitchen docusate-casanthranol (PERICOLACE) 100-30 MG per capsule daily as needed.      Marland Kitchen FLUoxetine (PROZAC) 10 MG capsule Take 10 mg by mouth 3 (three) times daily.     Marland Kitchen L-Methylfolate-B12-B6-B2 (CEREFOLIN) 03-04-49-5 MG TABS Take 1 tablet by mouth at bedtime.    Marland Kitchen levothyroxine (SYNTHROID, LEVOTHROID) 75 MCG tablet Take 1 tablet (75 mcg total) by mouth daily. 90 tablet 3  . LORazepam (ATIVAN) 0.5 MG tablet Take 0.5 mg by mouth 3 times/day as needed-between meals & bedtime for anxiety.     . Multiple Vitamins-Minerals (MULTIVITAMIN WITH MINERALS) tablet Take 1 tablet by mouth daily.      Marland Kitchen omeprazole (PRILOSEC) 40 MG capsule Take one tablet by mouth twice daily 180 capsule 1  . primidone (MYSOLINE) 50 MG tablet 2 tabs AM and 1 tab QHS 270 tablet 3  . RA KRILL OIL 500 MG CAPS Take by mouth.    . rosuvastatin (CRESTOR) 20 MG tablet Take 1 tablet (20 mg total) by mouth daily.  90 tablet 3  . TOVIAZ 8 MG TB24 tablet TAKE 1 TABLET (8 MG TOTAL) BY MOUTH DAILY. 90 tablet 2  . traMADol (ULTRAM) 50 MG tablet TAKE 1 TABLET BY MOUTH EVERY 6 TO 8 HOURS AS NEEDED  0  . valACYclovir (VALTREX) 500 MG tablet Take 1 tablet (500 mg total) by mouth daily. 15 tablet 1  . vitamin B-12 (CYANOCOBALAMIN) 1000 MCG tablet Take 1,000 mcg by mouth at bedtime.    Marland Kitchen zolpidem (AMBIEN) 5 MG tablet Take 1 tablet (5 mg total) by mouth at bedtime as needed for sleep. 30 tablet 0  . olmesartan (BENICAR) 20 MG tablet Take 1 tablet (20 mg total) by mouth daily. 90 tablet 3   No current facility-administered medications for this visit.    BP 134/62 mmHg  Pulse 57  Ht 5' (1.524 m)  Wt 118 lb (53.524 kg)  BMI 23.05 kg/m2 General:  NAD Neck: No JVD, no thyromegaly or thyroid nodule.  Lungs: Clear to auscultation bilaterally with normal respiratory effort. CV: Nondisplaced PMI.  Heart regular S1/S2, no S3/S4, 1/6 SEM.  No peripheral edema.  No carotid bruit.  Normal pedal pulses.  Abdomen: Soft, nontender, no hepatosplenomegaly, no distention.  Skin: Intact without lesions or rashes.  Neurologic: Alert and oriented x 3.  Psych: Normal affect. Extremities: No clubbing or cyanosis.   Assessment/Plan: 1. Carotid stenosis: Repeat carotid dopplers due, will arrange.  2. CAD: Coronary calcium noted on CT.  Cardiolite was normal in 4/16.  I will have her continue ASA 81 mg daily and Crestor 20 daily.  No ischemic symptoms.  3. Aortic valve disorder: Mild AI, very mild AS on echo in 12/14.  Would repeat echo at some point in the next year to reassess.  4. Bradycardia: Mild, asymptomatic.  Avoid nodal blocking agents.   5. HTN: BP running too high. Stop losartan, start Benicar 20 mg daily.  She will check BP daily and we will call in 2 wks for numbers.  BMET today and repeat in 2 wks.  6. Hyperlipidemia: Check lipids today, goal LDL < 70 given carotid stenosis and CAD.   Loralie Champagne 01/05/2016

## 2016-01-05 NOTE — Patient Instructions (Signed)
Medication Instructions:  Stop losartan.  Start Benicar 20mg  daily, generic is okay.  Labwork: Lipid profile/CBCd/BMET/TSH today.  Your physician recommends that you return for lab work in: about 2 weeks-this is scheduled for Tuesday April 18,2017-the lab is open from 7:30AM-5PM.   Testing/Procedures: None  Follow-Up: Your physician wants you to follow-up in: 1 year with Dr Aundra Dubin  (April 2018).  You will receive a reminder letter in the mail two months in advance. If you don't receive a letter, please call our office to schedule the follow-up appointment.    Any Other Special Instructions Will Be Listed Below (If Applicable).  Your physician has requested that you regularly monitor and record your blood pressure readings at home. Please use the same machine at the same time of day to check your readings and record them. I will call you in about 2 weeks to get the readings.        If you need a refill on your cardiac medications before your next appointment, please call your pharmacy.

## 2016-01-06 ENCOUNTER — Other Ambulatory Visit: Payer: Self-pay | Admitting: *Deleted

## 2016-01-06 ENCOUNTER — Telehealth: Payer: Self-pay | Admitting: Gynecology

## 2016-01-06 ENCOUNTER — Telehealth: Payer: Self-pay

## 2016-01-06 MED ORDER — POTASSIUM CHLORIDE CRYS ER 20 MEQ PO TBCR: 20.0000 meq | EXTENDED_RELEASE_TABLET | Freq: Every day | ORAL | Status: DC

## 2016-01-06 NOTE — Telephone Encounter (Signed)
Prior auth for Olmesartan 20mg  submitted to [facility].

## 2016-01-06 NOTE — Telephone Encounter (Signed)
Pt cost  $0 ,Prolia due after 01/07/2016   Calcium  9.0  On  01/05/2016  M89.9   Insurance No Deductible ($183 met ) secondary insurance [employer]   No Co pay .  Cost pt $ 0  Left message on Vm to call to set up appointment

## 2016-01-08 ENCOUNTER — Encounter: Payer: Self-pay | Admitting: Gynecology

## 2016-01-09 ENCOUNTER — Telehealth: Payer: Self-pay

## 2016-01-09 DIAGNOSIS — F332 Major depressive disorder, recurrent severe without psychotic features: Secondary | ICD-10-CM | POA: Diagnosis not present

## 2016-01-09 NOTE — Telephone Encounter (Signed)
Olmesartan has been denied by [employer]. I informed both patient and her husband. I will appeal this. In the meantime she will continue Losartan 100mg .

## 2016-01-10 NOTE — Telephone Encounter (Signed)
It should be generic, how much would it be just to pay for it directly?

## 2016-01-12 NOTE — Telephone Encounter (Signed)
Follow Up:   Please call,needs to ask you a question about her medicine.

## 2016-01-12 NOTE — Telephone Encounter (Signed)
It would still cost a few hundred $, per the pharmacist. I'll work on the appeal.

## 2016-01-13 NOTE — Telephone Encounter (Signed)
$   0 cost for pt Prolia, Talked with Keslyn, will come in on 01/14/16 at 2 30 pm for injection. Nurse call only. Next injection due after 07/16/16

## 2016-01-14 ENCOUNTER — Ambulatory Visit (INDEPENDENT_AMBULATORY_CARE_PROVIDER_SITE_OTHER): Payer: Medicare Other | Admitting: *Deleted

## 2016-01-14 DIAGNOSIS — M810 Age-related osteoporosis without current pathological fracture: Secondary | ICD-10-CM

## 2016-01-14 MED ORDER — DENOSUMAB 60 MG/ML ~~LOC~~ SOLN
60.0000 mg | Freq: Once | SUBCUTANEOUS | Status: AC
  Administered 2016-01-14: 60 mg via SUBCUTANEOUS

## 2016-01-14 NOTE — Telephone Encounter (Signed)
Attempted to call patient. Could not leave a message.

## 2016-01-16 DIAGNOSIS — F332 Major depressive disorder, recurrent severe without psychotic features: Secondary | ICD-10-CM | POA: Diagnosis not present

## 2016-01-16 NOTE — Telephone Encounter (Signed)
Appeal for Olmesartan was denied. Needs to try Irbesartan, Telmisartan or Valsartan.

## 2016-01-18 NOTE — Telephone Encounter (Signed)
Ok, try irbesartan 150 mg daily to replace losartan and get BMET in 10 days after starting.

## 2016-01-19 MED ORDER — IRBESARTAN 150 MG PO TABS: 150.0000 mg | ORAL_TABLET | Freq: Every day | ORAL | Status: DC

## 2016-01-19 NOTE — Telephone Encounter (Signed)
I spoke with Meghan Oliver and made her aware of medication change for patient.  The pt will stop Losartan and switch to Irbesartan 150mg  daily.  Plan to recheck BMP 01/30/16. [name] requested that a 30 Rx be sent into the pharmacy and this was sent to [facility].

## 2016-01-20 ENCOUNTER — Other Ambulatory Visit: Payer: Medicare Other

## 2016-01-23 DIAGNOSIS — F332 Major depressive disorder, recurrent severe without psychotic features: Secondary | ICD-10-CM | POA: Diagnosis not present

## 2016-01-23 DIAGNOSIS — Z79899 Other long term (current) drug therapy: Secondary | ICD-10-CM | POA: Diagnosis not present

## 2016-01-30 ENCOUNTER — Other Ambulatory Visit: Payer: Medicare Other

## 2016-01-30 DIAGNOSIS — Z7982 Long term (current) use of aspirin: Secondary | ICD-10-CM | POA: Diagnosis not present

## 2016-01-30 DIAGNOSIS — Z87891 Personal history of nicotine dependence: Secondary | ICD-10-CM | POA: Diagnosis not present

## 2016-01-30 DIAGNOSIS — E785 Hyperlipidemia, unspecified: Secondary | ICD-10-CM | POA: Diagnosis not present

## 2016-01-30 DIAGNOSIS — F332 Major depressive disorder, recurrent severe without psychotic features: Secondary | ICD-10-CM | POA: Diagnosis not present

## 2016-01-30 DIAGNOSIS — E039 Hypothyroidism, unspecified: Secondary | ICD-10-CM | POA: Diagnosis not present

## 2016-01-30 DIAGNOSIS — Z79899 Other long term (current) drug therapy: Secondary | ICD-10-CM | POA: Diagnosis not present

## 2016-01-30 DIAGNOSIS — I10 Essential (primary) hypertension: Secondary | ICD-10-CM | POA: Diagnosis not present

## 2016-01-30 DIAGNOSIS — F40240 Claustrophobia: Secondary | ICD-10-CM | POA: Diagnosis not present

## 2016-02-06 ENCOUNTER — Telehealth: Payer: Self-pay | Admitting: *Deleted

## 2016-02-06 DIAGNOSIS — F3341 Major depressive disorder, recurrent, in partial remission: Secondary | ICD-10-CM | POA: Diagnosis not present

## 2016-02-06 NOTE — Telephone Encounter (Signed)
Copied from Dr Claris Gladden 01/05/16 office note:  HTN: BP running too high. Stop losartan, start Benicar 20 mg daily. She will check BP daily and we will call in 2 wks for numbers. BMET today and repeat in 2 wks.     02/06/16--insurance appeal for Olmesartan was denied, pt prescribed irbesartan 150mg  daily since Olmesartan denied. I spoke with [name] today about pt's BP. [name] states pt has not been on irbesartan for 2 weeks yet. [name] states pt's BP seems to be okay on irbesartan. Pt is going out of town for a week next week. BMET scheduled for 02/19/16. [name] will continue to take and record pt's BP, will follow up after BMET 02/19/16.

## 2016-02-16 ENCOUNTER — Ambulatory Visit: Payer: Medicare Other | Admitting: Cardiology

## 2016-02-18 DIAGNOSIS — F332 Major depressive disorder, recurrent severe without psychotic features: Secondary | ICD-10-CM | POA: Diagnosis not present

## 2016-02-18 DIAGNOSIS — F3341 Major depressive disorder, recurrent, in partial remission: Secondary | ICD-10-CM | POA: Diagnosis not present

## 2016-02-19 ENCOUNTER — Other Ambulatory Visit: Payer: Medicare Other

## 2016-02-22 ENCOUNTER — Other Ambulatory Visit: Payer: Self-pay | Admitting: Internal Medicine

## 2016-02-23 DIAGNOSIS — M75101 Unspecified rotator cuff tear or rupture of right shoulder, not specified as traumatic: Secondary | ICD-10-CM | POA: Diagnosis not present

## 2016-02-23 DIAGNOSIS — M25511 Pain in right shoulder: Secondary | ICD-10-CM | POA: Diagnosis not present

## 2016-02-23 DIAGNOSIS — M25611 Stiffness of right shoulder, not elsewhere classified: Secondary | ICD-10-CM | POA: Diagnosis not present

## 2016-02-25 ENCOUNTER — Other Ambulatory Visit: Payer: Medicare Other | Admitting: *Deleted

## 2016-02-25 ENCOUNTER — Other Ambulatory Visit: Payer: Self-pay

## 2016-02-25 DIAGNOSIS — M75101 Unspecified rotator cuff tear or rupture of right shoulder, not specified as traumatic: Secondary | ICD-10-CM | POA: Diagnosis not present

## 2016-02-25 DIAGNOSIS — E876 Hypokalemia: Secondary | ICD-10-CM | POA: Diagnosis not present

## 2016-02-25 DIAGNOSIS — M25611 Stiffness of right shoulder, not elsewhere classified: Secondary | ICD-10-CM | POA: Diagnosis not present

## 2016-02-25 DIAGNOSIS — R350 Frequency of micturition: Secondary | ICD-10-CM

## 2016-02-25 DIAGNOSIS — M25511 Pain in right shoulder: Secondary | ICD-10-CM | POA: Diagnosis not present

## 2016-02-26 ENCOUNTER — Other Ambulatory Visit: Payer: Self-pay | Admitting: *Deleted

## 2016-02-26 DIAGNOSIS — R7989 Other specified abnormal findings of blood chemistry: Secondary | ICD-10-CM

## 2016-02-26 DIAGNOSIS — I10 Essential (primary) hypertension: Secondary | ICD-10-CM

## 2016-02-26 LAB — URINALYSIS
Bilirubin Urine: NEGATIVE
GLUCOSE, UA: NEGATIVE
HGB URINE DIPSTICK: NEGATIVE
Ketones, ur: NEGATIVE
Nitrite: NEGATIVE
PROTEIN: NEGATIVE
Specific Gravity, Urine: 1.028 (ref 1.001–1.035)
pH: 6 (ref 5.0–8.0)

## 2016-02-26 LAB — COMPREHENSIVE METABOLIC PANEL
ALBUMIN: 4.1 g/dL (ref 3.6–5.1)
ALK PHOS: 32 U/L — AB (ref 33–130)
ALT: 19 U/L (ref 6–29)
AST: 14 U/L (ref 10–35)
BILIRUBIN TOTAL: 0.3 mg/dL (ref 0.2–1.2)
BUN: 26 mg/dL — ABNORMAL HIGH (ref 7–25)
CALCIUM: 9.6 mg/dL (ref 8.6–10.4)
CO2: 23 mmol/L (ref 20–31)
Chloride: 93 mmol/L — ABNORMAL LOW (ref 98–110)
Creat: 1.04 mg/dL — ABNORMAL HIGH (ref 0.60–0.93)
GLUCOSE: 90 mg/dL (ref 65–99)
POTASSIUM: 5 mmol/L (ref 3.5–5.3)
Sodium: 126 mmol/L — ABNORMAL LOW (ref 135–146)
TOTAL PROTEIN: 6.4 g/dL (ref 6.1–8.1)

## 2016-03-02 DIAGNOSIS — M25611 Stiffness of right shoulder, not elsewhere classified: Secondary | ICD-10-CM | POA: Diagnosis not present

## 2016-03-02 DIAGNOSIS — M75101 Unspecified rotator cuff tear or rupture of right shoulder, not specified as traumatic: Secondary | ICD-10-CM | POA: Diagnosis not present

## 2016-03-02 DIAGNOSIS — M25511 Pain in right shoulder: Secondary | ICD-10-CM | POA: Diagnosis not present

## 2016-03-03 DIAGNOSIS — M25611 Stiffness of right shoulder, not elsewhere classified: Secondary | ICD-10-CM | POA: Diagnosis not present

## 2016-03-03 DIAGNOSIS — M75101 Unspecified rotator cuff tear or rupture of right shoulder, not specified as traumatic: Secondary | ICD-10-CM | POA: Diagnosis not present

## 2016-03-03 DIAGNOSIS — M25511 Pain in right shoulder: Secondary | ICD-10-CM | POA: Diagnosis not present

## 2016-03-03 DIAGNOSIS — F3341 Major depressive disorder, recurrent, in partial remission: Secondary | ICD-10-CM | POA: Diagnosis not present

## 2016-03-04 ENCOUNTER — Other Ambulatory Visit: Payer: Medicare Other

## 2016-03-05 DIAGNOSIS — F332 Major depressive disorder, recurrent severe without psychotic features: Secondary | ICD-10-CM | POA: Diagnosis not present

## 2016-03-26 DIAGNOSIS — F3341 Major depressive disorder, recurrent, in partial remission: Secondary | ICD-10-CM | POA: Diagnosis not present

## 2016-03-30 ENCOUNTER — Ambulatory Visit (INDEPENDENT_AMBULATORY_CARE_PROVIDER_SITE_OTHER): Payer: Medicare Other | Admitting: Internal Medicine

## 2016-03-30 ENCOUNTER — Encounter: Payer: Self-pay | Admitting: Internal Medicine

## 2016-03-30 VITALS — BP 134/72 | HR 70 | Temp 98.7°F | Resp 16 | Wt 110.0 lb

## 2016-03-30 DIAGNOSIS — I2510 Atherosclerotic heart disease of native coronary artery without angina pectoris: Secondary | ICD-10-CM

## 2016-03-30 DIAGNOSIS — R509 Fever, unspecified: Secondary | ICD-10-CM | POA: Diagnosis not present

## 2016-03-30 LAB — POCT URINALYSIS DIPSTICK
Bilirubin, UA: NEGATIVE
GLUCOSE UA: NEGATIVE
KETONES UA: NEGATIVE
Leukocytes, UA: NEGATIVE
Nitrite, UA: NEGATIVE
Protein, UA: NEGATIVE
RBC UA: NEGATIVE
SPEC GRAV UA: 1.01
Urobilinogen, UA: 0.2
pH, UA: 7

## 2016-03-30 LAB — CBC WITH DIFFERENTIAL/PLATELET
BASOS ABS: 0 {cells}/uL (ref 0–200)
Basophils Relative: 0 %
Eosinophils Absolute: 81 cells/uL (ref 15–500)
Eosinophils Relative: 1 %
HEMATOCRIT: 34.9 % — AB (ref 35.0–45.0)
HEMOGLOBIN: 12.2 g/dL (ref 11.7–15.5)
LYMPHS ABS: 2268 {cells}/uL (ref 850–3900)
LYMPHS PCT: 28 %
MCH: 31.1 pg (ref 27.0–33.0)
MCHC: 35 g/dL (ref 32.0–36.0)
MCV: 89 fL (ref 80.0–100.0)
MONO ABS: 810 {cells}/uL (ref 200–950)
MPV: 9 fL (ref 7.5–12.5)
Monocytes Relative: 10 %
NEUTROS PCT: 61 %
Neutro Abs: 4941 cells/uL (ref 1500–7800)
Platelets: 285 10*3/uL (ref 140–400)
RBC: 3.92 MIL/uL (ref 3.80–5.10)
RDW: 13.4 % (ref 11.0–15.0)
WBC: 8.1 10*3/uL (ref 3.8–10.8)

## 2016-03-30 NOTE — Progress Notes (Signed)
   Subjective:    Patient ID: Meghan Oliver, female    DOB: 24-Sep-1941, 75 y.o.   MRN: [id number]  HPI 75 year old Female who has history of bipolar disorder. She returned from her trip to [place] a couple of weeks ago and has been fatigued and lethargic. Says she did have a good time on her trip. It was a cruise and she and her husband were accompanied by a 18 year old granddaughter. Says she feels depressed and doesn't feel like doing anything. She has a caretaker that stays with her during the day. They have tried to get her to exercise and move about the house but she basically just sits most of the timer goes to bed. She's been on multiple medications throughout the years. Recently was started on Latuda. Caretakers tell me she is scheduled to go to [facility] tomorrow for ECT. She's had multiple ECT treatments over the past several months which unfortunately have not seemed to bring her out of this depression.  Caretaker called day thinking patient had low-grade fever. She was having some urinary frequency. Advised bringing patient office for evaluation. When she arrived she was afebrile. Urine specimen was clear.  She has 3 small bottles of water in her purse. Says her mouth is dry all the time likely due to some of her medications.  At one point in the remote past she appeared to have psychogenic water intoxication and had low sodium.  We are going to check electrolytes today. Caretaker say her TSH has been checked recently.    Review of Systems see above     Objective:   Physical Exam Affect is flat. She smiles advise smile at her. Skin warm and dry. Nodes none. Chest clear to auscultation. Cardiac exam regular rate and rhythm normal S1 and S2. Extremities without edema.       Assessment & Plan:  Bipolar disorder with depression-going to do tomorrow for ECT treatment which she really doesn't like having  No evidence of urinary tract infection  Electrolytes drawn and pending as well as  CBC with diff  35 minutes spent with patient

## 2016-03-30 NOTE — Patient Instructions (Signed)
It was a pleasure to see you today. I hope you feel better soon. Lab work drawn and pending. No evidence of urinary tract infection

## 2016-03-31 DIAGNOSIS — F3341 Major depressive disorder, recurrent, in partial remission: Secondary | ICD-10-CM | POA: Diagnosis not present

## 2016-03-31 DIAGNOSIS — F332 Major depressive disorder, recurrent severe without psychotic features: Secondary | ICD-10-CM | POA: Diagnosis not present

## 2016-03-31 DIAGNOSIS — F419 Anxiety disorder, unspecified: Secondary | ICD-10-CM | POA: Diagnosis not present

## 2016-03-31 DIAGNOSIS — Z7982 Long term (current) use of aspirin: Secondary | ICD-10-CM | POA: Diagnosis not present

## 2016-03-31 DIAGNOSIS — Z79899 Other long term (current) drug therapy: Secondary | ICD-10-CM | POA: Diagnosis not present

## 2016-03-31 DIAGNOSIS — Z7952 Long term (current) use of systemic steroids: Secondary | ICD-10-CM | POA: Diagnosis not present

## 2016-03-31 LAB — COMPLETE METABOLIC PANEL WITH GFR
ALT: 22 U/L (ref 6–29)
AST: 16 U/L (ref 10–35)
Albumin: 3.8 g/dL (ref 3.6–5.1)
Alkaline Phosphatase: 37 U/L (ref 33–130)
BILIRUBIN TOTAL: 0.2 mg/dL (ref 0.2–1.2)
BUN: 25 mg/dL (ref 7–25)
CHLORIDE: 93 mmol/L — AB (ref 98–110)
CO2: 24 mmol/L (ref 20–31)
Calcium: 9.2 mg/dL (ref 8.6–10.4)
Creat: 1.1 mg/dL — ABNORMAL HIGH (ref 0.60–0.93)
GFR, EST AFRICAN AMERICAN: 56 mL/min — AB (ref >=60)
GFR, EST NON AFRICAN AMERICAN: 49 mL/min — AB (ref >=60)
GLUCOSE: 103 mg/dL — AB (ref 65–99)
Potassium: 4.3 mmol/L (ref 3.5–5.3)
SODIUM: 128 mmol/L — AB (ref 135–146)
TOTAL PROTEIN: 6.3 g/dL (ref 6.1–8.1)

## 2016-04-02 DIAGNOSIS — F3341 Major depressive disorder, recurrent, in partial remission: Secondary | ICD-10-CM | POA: Diagnosis not present

## 2016-04-07 DIAGNOSIS — M25551 Pain in right hip: Secondary | ICD-10-CM | POA: Diagnosis not present

## 2016-04-09 DIAGNOSIS — F3341 Major depressive disorder, recurrent, in partial remission: Secondary | ICD-10-CM | POA: Diagnosis not present

## 2016-04-12 DIAGNOSIS — M25551 Pain in right hip: Secondary | ICD-10-CM | POA: Diagnosis not present

## 2016-04-14 DIAGNOSIS — F3341 Major depressive disorder, recurrent, in partial remission: Secondary | ICD-10-CM | POA: Diagnosis not present

## 2016-04-16 DIAGNOSIS — M25551 Pain in right hip: Secondary | ICD-10-CM | POA: Diagnosis not present

## 2016-04-23 DIAGNOSIS — F3341 Major depressive disorder, recurrent, in partial remission: Secondary | ICD-10-CM | POA: Diagnosis not present

## 2016-04-27 ENCOUNTER — Other Ambulatory Visit: Payer: Self-pay | Admitting: Internal Medicine

## 2016-04-29 DIAGNOSIS — F3341 Major depressive disorder, recurrent, in partial remission: Secondary | ICD-10-CM | POA: Diagnosis not present

## 2016-04-30 ENCOUNTER — Other Ambulatory Visit: Payer: Self-pay | Admitting: Psychiatry

## 2016-04-30 DIAGNOSIS — G3184 Mild cognitive impairment, so stated: Secondary | ICD-10-CM

## 2016-05-07 ENCOUNTER — Inpatient Hospital Stay
Admission: RE | Admit: 2016-05-07 | Discharge: 2016-05-07 | Disposition: A | Discharge status: Home / Self Care | Payer: Self-pay | Source: Ambulatory Visit | Attending: Psychiatry | Admitting: Psychiatry

## 2016-05-07 ENCOUNTER — Other Ambulatory Visit: Payer: Self-pay | Admitting: Psychiatry

## 2016-05-07 ENCOUNTER — Ambulatory Visit
Admission: RE | Admit: 2016-05-07 | Discharge: 2016-05-07 | Disposition: A | Discharge status: Home / Self Care | Payer: Medicare Other | Source: Ambulatory Visit | Attending: Psychiatry | Admitting: Psychiatry

## 2016-05-07 DIAGNOSIS — G3184 Mild cognitive impairment, so stated: Secondary | ICD-10-CM

## 2016-05-07 MED ORDER — IOPAMIDOL (ISOVUE-300) INJECTION 61%
75.0000 mL | Freq: Once | INTRAVENOUS | Status: AC | PRN
  Administered 2016-05-07: 75 mL via INTRAVENOUS

## 2016-05-09 ENCOUNTER — Other Ambulatory Visit: Payer: Self-pay | Admitting: Cardiology

## 2016-05-12 DIAGNOSIS — F3341 Major depressive disorder, recurrent, in partial remission: Secondary | ICD-10-CM | POA: Diagnosis not present

## 2016-05-14 DIAGNOSIS — F3181 Bipolar II disorder: Secondary | ICD-10-CM | POA: Diagnosis not present

## 2016-05-17 DIAGNOSIS — F332 Major depressive disorder, recurrent severe without psychotic features: Secondary | ICD-10-CM | POA: Diagnosis not present

## 2016-05-19 DIAGNOSIS — F3341 Major depressive disorder, recurrent, in partial remission: Secondary | ICD-10-CM | POA: Diagnosis not present

## 2016-05-21 DIAGNOSIS — F332 Major depressive disorder, recurrent severe without psychotic features: Secondary | ICD-10-CM | POA: Diagnosis not present

## 2016-05-24 DIAGNOSIS — F332 Major depressive disorder, recurrent severe without psychotic features: Secondary | ICD-10-CM | POA: Diagnosis not present

## 2016-05-27 DIAGNOSIS — F3181 Bipolar II disorder: Secondary | ICD-10-CM | POA: Diagnosis not present

## 2016-05-28 DIAGNOSIS — Z79899 Other long term (current) drug therapy: Secondary | ICD-10-CM | POA: Diagnosis not present

## 2016-05-28 DIAGNOSIS — F332 Major depressive disorder, recurrent severe without psychotic features: Secondary | ICD-10-CM | POA: Diagnosis not present

## 2016-05-28 DIAGNOSIS — Z5181 Encounter for therapeutic drug level monitoring: Secondary | ICD-10-CM | POA: Diagnosis not present

## 2016-05-31 DIAGNOSIS — F3170 Bipolar disorder, currently in remission, most recent episode unspecified: Secondary | ICD-10-CM | POA: Diagnosis not present

## 2016-05-31 DIAGNOSIS — F329 Major depressive disorder, single episode, unspecified: Secondary | ICD-10-CM | POA: Diagnosis not present

## 2016-05-31 DIAGNOSIS — F332 Major depressive disorder, recurrent severe without psychotic features: Secondary | ICD-10-CM | POA: Diagnosis not present

## 2016-06-01 ENCOUNTER — Other Ambulatory Visit: Payer: Self-pay

## 2016-06-02 DIAGNOSIS — F3341 Major depressive disorder, recurrent, in partial remission: Secondary | ICD-10-CM | POA: Diagnosis not present

## 2016-06-04 DIAGNOSIS — F3170 Bipolar disorder, currently in remission, most recent episode unspecified: Secondary | ICD-10-CM | POA: Diagnosis not present

## 2016-06-09 DIAGNOSIS — F332 Major depressive disorder, recurrent severe without psychotic features: Secondary | ICD-10-CM | POA: Diagnosis not present

## 2016-06-15 ENCOUNTER — Other Ambulatory Visit (HOSPITAL_COMMUNITY)
Admission: RE | Admit: 2016-06-15 | Discharge: 2016-06-15 | Disposition: A | Discharge status: Home / Self Care | Payer: Medicare Other | Source: Ambulatory Visit | Attending: Psychiatry | Admitting: Psychiatry

## 2016-06-15 DIAGNOSIS — F333 Major depressive disorder, recurrent, severe with psychotic symptoms: Secondary | ICD-10-CM | POA: Insufficient documentation

## 2016-06-15 DIAGNOSIS — Z79899 Other long term (current) drug therapy: Secondary | ICD-10-CM | POA: Insufficient documentation

## 2016-06-15 DIAGNOSIS — G3184 Mild cognitive impairment, so stated: Secondary | ICD-10-CM | POA: Insufficient documentation

## 2016-06-15 DIAGNOSIS — E559 Vitamin D deficiency, unspecified: Secondary | ICD-10-CM | POA: Diagnosis not present

## 2016-06-15 LAB — CBC WITH DIFFERENTIAL/PLATELET
Basophils Absolute: 0 10*3/uL (ref 0.0–0.1)
Basophils Relative: 0 %
EOS ABS: 0.2 10*3/uL (ref 0.0–0.7)
Eosinophils Relative: 3 %
HEMATOCRIT: 35 % — AB (ref 36.0–46.0)
HEMOGLOBIN: 11.2 g/dL — AB (ref 12.0–15.0)
LYMPHS ABS: 2 10*3/uL (ref 0.7–4.0)
Lymphocytes Relative: 33 %
MCH: 30.5 pg (ref 26.0–34.0)
MCHC: 32 g/dL (ref 30.0–36.0)
MCV: 95.4 fL (ref 78.0–100.0)
MONOS PCT: 9 %
Monocytes Absolute: 0.5 10*3/uL (ref 0.1–1.0)
NEUTROS ABS: 3.4 10*3/uL (ref 1.7–7.7)
NEUTROS PCT: 55 %
Platelets: 229 10*3/uL (ref 150–400)
RBC: 3.67 MIL/uL — AB (ref 3.87–5.11)
RDW: 12.7 % (ref 11.5–15.5)
WBC: 6.1 10*3/uL (ref 4.0–10.5)

## 2016-06-15 LAB — COMPREHENSIVE METABOLIC PANEL
ALBUMIN: 2 g/dL — AB (ref 3.5–5.0)
ALK PHOS: 18 U/L — AB (ref 38–126)
ALT: 22 U/L (ref 14–54)
ANION GAP: 4 — AB (ref 5–15)
AST: 16 U/L (ref 15–41)
BILIRUBIN TOTAL: 0.2 mg/dL — AB (ref 0.3–1.2)
BUN: 12 mg/dL (ref 6–20)
CALCIUM: 5.1 mg/dL — AB (ref 8.9–10.3)
CO2: 14 mmol/L — AB (ref 22–32)
CREATININE: 0.59 mg/dL (ref 0.44–1.00)
Chloride: 123 mmol/L — ABNORMAL HIGH (ref 101–111)
GFR calc Af Amer: >60 mL/min (ref >60)
GFR calc non Af Amer: >60 mL/min (ref >60)
GLUCOSE: 89 mg/dL (ref 65–99)
Potassium: 2.4 mmol/L — CL (ref 3.5–5.1)
SODIUM: 141 mmol/L (ref 135–145)
TOTAL PROTEIN: 3.3 g/dL — AB (ref 6.5–8.1)

## 2016-06-15 LAB — VITAMIN B12: Vitamin B-12: 6228 pg/mL — ABNORMAL HIGH (ref 180–914)

## 2016-06-15 LAB — LITHIUM LEVEL: Lithium Lvl: 0.3 mmol/L — ABNORMAL LOW (ref 0.60–1.20)

## 2016-06-15 LAB — FOLATE

## 2016-06-16 DIAGNOSIS — F329 Major depressive disorder, single episode, unspecified: Secondary | ICD-10-CM | POA: Diagnosis not present

## 2016-06-16 DIAGNOSIS — F332 Major depressive disorder, recurrent severe without psychotic features: Secondary | ICD-10-CM | POA: Diagnosis not present

## 2016-06-16 LAB — THYROID PANEL WITH TSH
Free Thyroxine Index: 1.4 (ref 1.2–4.9)
T3 Uptake Ratio: 26 % (ref 24–39)
T4, Total: 5.3 ug/dL (ref 4.5–12.0)
TSH: 1.51 u[IU]/mL (ref 0.450–4.500)

## 2016-06-16 LAB — VITAMIN D 25 HYDROXY (VIT D DEFICIENCY, FRACTURES): Vit D, 25-Hydroxy: 56.5 ng/mL (ref 30.0–100.0)

## 2016-06-17 ENCOUNTER — Encounter: Payer: Self-pay | Admitting: Internal Medicine

## 2016-06-17 ENCOUNTER — Telehealth: Payer: Self-pay | Admitting: Internal Medicine

## 2016-06-17 MED ORDER — POTASSIUM CHLORIDE CRYS ER 20 MEQ PO TBCR: EXTENDED_RELEASE_TABLET | ORAL | 1 refills | Status: DC

## 2016-06-17 NOTE — Telephone Encounter (Signed)
Will call in [place]  40 meq daily and should have recheck B-met Monday. Everything else not too bad- of course serum proteins are low. Sodium is 141 and K is 2.4 BUN is 12 and Creatinie is 0.59. Liver functions are OK

## 2016-06-17 NOTE — Telephone Encounter (Signed)
Randol Kern, RN for patient calling to advise that patient had labs drawn the other day.  States that patient's potassium is very low.  [name] is out of town and wants to know if you would be willing to call in Potassium replacement for patient to her pharmacy.  [name] returns to town on Sunday evening.  States that patient has been doing very well as of recent.  [name] has e-mailed to me patient's up to date medication list.  Printed that out to update medication list.    [name] was also wanting to know if any of the patient's other labs were abnormal.  Advised I would have you review those.  [name] had labs drawn.    Please advise.    Pharmacy:  [facility] [place]

## 2016-06-22 ENCOUNTER — Telehealth: Payer: Self-pay | Admitting: Gynecology

## 2016-06-22 DIAGNOSIS — M810 Age-related osteoporosis without current pathological fracture: Secondary | ICD-10-CM

## 2016-06-22 NOTE — Telephone Encounter (Signed)
Prolia due after  07/16/2016  Insurance secondary covers Deductible ($187), and 20% co insurance $420 , Cost for pt $0.  Calcium level   5.1  On 06/15/16. I will inform Elon Alas regarding Calcium level.

## 2016-06-23 NOTE — Telephone Encounter (Signed)
Spoke with [name], mild confusion then spoke with [name] her personal nurse her phone number is [phone]. Syble was started on potassium supplement Sunday, September 17 will have labs/CMP drawn next week, week of September 25. Reviewed Prolia is due week of October 12.

## 2016-06-24 DIAGNOSIS — F3181 Bipolar II disorder: Secondary | ICD-10-CM | POA: Diagnosis not present

## 2016-06-30 DIAGNOSIS — F332 Major depressive disorder, recurrent severe without psychotic features: Secondary | ICD-10-CM | POA: Diagnosis not present

## 2016-07-01 ENCOUNTER — Other Ambulatory Visit: Payer: Medicare Other | Admitting: Internal Medicine

## 2016-07-01 DIAGNOSIS — E876 Hypokalemia: Secondary | ICD-10-CM | POA: Diagnosis not present

## 2016-07-01 DIAGNOSIS — F3341 Major depressive disorder, recurrent, in partial remission: Secondary | ICD-10-CM | POA: Diagnosis not present

## 2016-07-01 LAB — POTASSIUM: Potassium: 4.4 mmol/L (ref 3.5–5.3)

## 2016-07-13 NOTE — Telephone Encounter (Signed)
Labs on 06/2016 did not include repeat Calcium. Talked with [name] Card Nurse and she will bring Meghan Oliver in for calcium level on 07/20/16 . [name] also requested that Lucianna receive her Flu shot. Will call and schedule Prolia after calcium results.

## 2016-07-19 ENCOUNTER — Encounter: Payer: Self-pay | Admitting: Gastroenterology

## 2016-07-20 ENCOUNTER — Other Ambulatory Visit: Payer: Medicare Other

## 2016-07-20 ENCOUNTER — Ambulatory Visit (INDEPENDENT_AMBULATORY_CARE_PROVIDER_SITE_OTHER): Payer: Medicare Other | Admitting: *Deleted

## 2016-07-20 DIAGNOSIS — M810 Age-related osteoporosis without current pathological fracture: Secondary | ICD-10-CM | POA: Diagnosis not present

## 2016-07-20 DIAGNOSIS — Z23 Encounter for immunization: Secondary | ICD-10-CM

## 2016-07-20 LAB — CALCIUM: Calcium: 9.7 mg/dL (ref 8.6–10.4)

## 2016-07-21 ENCOUNTER — Encounter: Payer: Self-pay | Admitting: Gastroenterology

## 2016-07-21 DIAGNOSIS — R569 Unspecified convulsions: Secondary | ICD-10-CM | POA: Diagnosis not present

## 2016-07-21 DIAGNOSIS — F332 Major depressive disorder, recurrent severe without psychotic features: Secondary | ICD-10-CM | POA: Diagnosis not present

## 2016-07-22 DIAGNOSIS — F3181 Bipolar II disorder: Secondary | ICD-10-CM | POA: Diagnosis not present

## 2016-07-23 ENCOUNTER — Ambulatory Visit (INDEPENDENT_AMBULATORY_CARE_PROVIDER_SITE_OTHER): Payer: Medicare Other | Admitting: Internal Medicine

## 2016-07-23 ENCOUNTER — Encounter: Payer: Self-pay | Admitting: Internal Medicine

## 2016-07-23 ENCOUNTER — Other Ambulatory Visit: Payer: Self-pay | Admitting: Internal Medicine

## 2016-07-23 VITALS — BP 128/62 | HR 62 | Ht 59.5 in | Wt 109.0 lb

## 2016-07-23 DIAGNOSIS — I10 Essential (primary) hypertension: Secondary | ICD-10-CM

## 2016-07-23 DIAGNOSIS — R251 Tremor, unspecified: Secondary | ICD-10-CM | POA: Diagnosis not present

## 2016-07-23 DIAGNOSIS — K5909 Other constipation: Secondary | ICD-10-CM | POA: Diagnosis not present

## 2016-07-23 DIAGNOSIS — R413 Other amnesia: Secondary | ICD-10-CM | POA: Diagnosis not present

## 2016-07-23 DIAGNOSIS — Z9882 Breast implant status: Secondary | ICD-10-CM | POA: Diagnosis not present

## 2016-07-23 DIAGNOSIS — R8290 Unspecified abnormal findings in urine: Secondary | ICD-10-CM

## 2016-07-23 DIAGNOSIS — E7800 Pure hypercholesterolemia, unspecified: Secondary | ICD-10-CM | POA: Diagnosis not present

## 2016-07-23 DIAGNOSIS — Z8669 Personal history of other diseases of the nervous system and sense organs: Secondary | ICD-10-CM | POA: Diagnosis not present

## 2016-07-23 DIAGNOSIS — E876 Hypokalemia: Secondary | ICD-10-CM | POA: Diagnosis not present

## 2016-07-23 DIAGNOSIS — I2510 Atherosclerotic heart disease of native coronary artery without angina pectoris: Secondary | ICD-10-CM

## 2016-07-23 DIAGNOSIS — F310 Bipolar disorder, current episode hypomanic: Secondary | ICD-10-CM | POA: Diagnosis not present

## 2016-07-23 DIAGNOSIS — E784 Other hyperlipidemia: Secondary | ICD-10-CM

## 2016-07-23 DIAGNOSIS — I779 Disorder of arteries and arterioles, unspecified: Secondary | ICD-10-CM | POA: Diagnosis not present

## 2016-07-23 DIAGNOSIS — M810 Age-related osteoporosis without current pathological fracture: Secondary | ICD-10-CM | POA: Diagnosis not present

## 2016-07-23 DIAGNOSIS — Z0000 Encounter for general adult medical examination without abnormal findings: Secondary | ICD-10-CM

## 2016-07-23 DIAGNOSIS — E7849 Other hyperlipidemia: Secondary | ICD-10-CM

## 2016-07-23 DIAGNOSIS — I739 Peripheral vascular disease, unspecified: Secondary | ICD-10-CM

## 2016-07-23 LAB — POC URINALSYSI DIPSTICK (AUTOMATED)
Glucose, UA: NEGATIVE
Ketones, UA: NEGATIVE
NITRITE UA: NEGATIVE
PH UA: 6
PROTEIN UA: NEGATIVE
Spec Grav, UA: 1.01
UROBILINOGEN UA: NEGATIVE

## 2016-07-23 LAB — COMPREHENSIVE METABOLIC PANEL
ALK PHOS: 27 U/L — AB (ref 33–130)
ALT: 21 U/L (ref 6–29)
AST: 21 U/L (ref 10–35)
Albumin: 4.3 g/dL (ref 3.6–5.1)
BILIRUBIN TOTAL: 0.3 mg/dL (ref 0.2–1.2)
BUN: 14 mg/dL (ref 7–25)
CALCIUM: 9.6 mg/dL (ref 8.6–10.4)
CO2: 22 mmol/L (ref 20–31)
Chloride: 102 mmol/L (ref 98–110)
Creat: 0.96 mg/dL — ABNORMAL HIGH (ref 0.60–0.93)
GLUCOSE: 88 mg/dL (ref 65–99)
Potassium: 4.7 mmol/L (ref 3.5–5.3)
Sodium: 135 mmol/L (ref 135–146)
Total Protein: 6.5 g/dL (ref 6.1–8.1)

## 2016-07-23 LAB — LIPID PANEL
CHOL/HDL RATIO: 1.4 ratio (ref <=5.0)
Cholesterol: 239 mg/dL — ABNORMAL HIGH (ref 125–200)
HDL: 165 mg/dL (ref >=46)
LDL CALC: 59 mg/dL (ref <130)
Triglycerides: 73 mg/dL (ref <150)
VLDL: 15 mg/dL (ref <30)

## 2016-07-23 NOTE — Progress Notes (Addendum)
Subjective:    Patient ID: Meghan Oliver, female    DOB: 02-25-1941, 75 y.o.   MRN: [id number]  HPI  75 year old Female for health maintenance exam and evaluation of medical issues. More reflux recently.Scheduled for endoscopy and colonoscopy in the near future.  This past year she's had significant issues with depression and had many ECT treatments. Recently found new psychiatrist in [place], Dr. Barbie Banner, and appears to be doing much better. Planning trip to [place] with her husband.  She is on Cerefolin, primidone, omeprazole, lorazepam, Memantine, lithium, nortriptyline, Paxil, Synthroid, Crestor, baby aspirin 81 mg daily, vitamin B-12 1000 g daily multivitamin, amlodipine, Toviaz, Celebrex, irbesartan, chlorthalidone, vitamin B 6. Takes Dulcolax and Peri-Colace. Takes Ambien when necessary. Takes tramadol when necessary back pain per Dr. Noemi Chapel. Takes MiraLAX when necessary.  Cardiologist is Dr. Loralie Champagne. Psychiatrist is Dr. Yehuda Budd- telephone [phone]  GYN is Glynn Octave, nurse practitioner.  Neurologist is Dr. Carles Collet, orthopedist is Dr. Noemi Chapel.  She has a history of bipolar 2 illness, hypertension, GE reflux, urinary frequency, hyperlipidemia and hypothyroidism.  By September 27 of this year she had received 32 ECT treatments at [facility]. Currently she is being given ECT there every 3 weeks.  Her memory is not very good. She is accompanied today by her husband and [name], R.N. and his caregiver and assist with her medications.  Long-standing history of depression starting in her 54s after the birth of her first child.  History of bilateral breast implants.  History of carotid artery disease. Doppler study done December 2010 showed 40-59% stenosis of both internal carotid arteries. History of obstructive sleep apnea diagnosed by Dr. Gwenette Greet.  History of appendectomy 1971, bilateral tubal ligation, left ovarian cystectomy.  According to neurologist, Dr. Carles Collet, she has had tremor  for several years worse in the right hand than the left. She has been treated with Topamax and Inderal in the past for tremor.  She had MRI of the brain July 2015 showing chronic microvascular ischemic changes.  History of hypertension and hyperlipidemia. History of lumbar disc disease. History of functional constipation. Last colonoscopy was 2012. Sometimes has nocturia.  History of hematuria of a weighted by Dr. Elyse Jarvis in the past.  History of transmitted murmur into her carotids.  History of osteoporosis.  Immunizations are up-to-date.  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 Does not smoke. Quit smoking in 1972. 2 adult daughters, one of whom lives in [place] and they plan to go there for thinks given. Occasionally has a glass of wine.  Family history: Father with history of bipolar disorder died at age 20 of an MI. Mother died with history of COPD. One brother.  History of left lower lobe pneumonia 2015.  In May 2015 she weighed 130 pounds. In April 2016 she weighed 119. Now weighs 109 pounds.          Review of Systems see above     Objective:   Physical Exam  Constitutional:  Thin White Female. Pleasant and cooperative.  HENT:  Head: Normocephalic and atraumatic.  Right Ear: External ear normal.  Left Ear: External ear normal.  Mouth/Throat: Oropharynx is clear and moist. No oropharyngeal exudate.  Eyes: Conjunctivae are normal. Pupils are equal, round, and reactive to light. No scleral icterus.  Neck: Neck supple. No JVD present. No thyromegaly present.  Cardiovascular: Normal rate and regular rhythm.   Murmur heard. Murmur transmitted to carotids bilaterally.   Pulmonary/Chest: Effort normal and breath  sounds normal. She has no wheezes. She has no rales.  Bilateral breast implants  Abdominal: Soft. Bowel sounds are normal. She exhibits no distension. There is no tenderness. There is no rebound.  Genitourinary:    Genitourinary Comments: Deferred to [facility] gynecology  Musculoskeletal: She exhibits no edema.  Lymphadenopathy:    She has no cervical adenopathy.  Neurological: She is alert. She has normal reflexes. She displays normal reflexes. No cranial nerve deficit.  Hand tremor  Skin: No rash noted. She is not diaphoretic.  Psychiatric:  Memory deficits. Affect is normal. Depression markedly improved          Assessment & Plan:  Bipolar disorder with severe depression now improved with multiple medications and ECT.  Hypothyroidism  Hypertension  History of bilateral carotid disease  Hyperlipidemia  Obstructive sleep apnea  GE reflux  Bilateral hand tremors  Chronic constipation  Lumbar disc disease  Osteoporosis  History of bilateral breast implants  Memory loss   Plan: Continue follow-up with psychiatrist. To see gastroenterologist in the near future. Return in 6 months or as needed. Continue same medications. Subjective:   Patient presents for Medicare Annual/Subsequent preventive examination.  Review Past Medical/Family/Social:   Risk Factors  Current exercise habits:  Dietary issues discussed:   Cardiac risk factors:  Depression Screen  (Note: if answer to either of the following is "Yes", a more complete depression screening is indicated)   Over the past two weeks, have you felt down, depressed or hopeless? No  Over the past two weeks, have you felt little interest or pleasure in doing things? No Have you lost interest or pleasure in daily life? No Do you often feel hopeless? No Do you cry easily over simple problems? No   Activities of Daily Living  In your present state of health, do you have any difficulty performing the following activities?:   Driving? Currently not driving-wants to drive but family fear she may get lost with recent memory issues Managing money? Currently not managing money Feeding yourself? No  Getting from bed to chair?  No  Climbing a flight of stairs? Go slowly Preparing food and eating?: No  Bathing or showering? No  Getting dressed: No  Getting to the toilet? No  Using the toilet:No  Moving around from place to place: No  In the past year have you fallen or had a near fall?:A minor fall Are you sexually active?  Do you have more than one partner? No   Hearing Difficulties: No  Do you often ask people to speak up or repeat themselves? No  Do you experience ringing or noises in your ears? No  Do you have difficulty understanding soft or whispered voices? No  Do you feel that you have a problem with memory? yes Do you often misplace items? yes   Home Safety:  Do you have a smoke alarm at your residence? Yes Do you have grab bars in the bathroom? Yes Do you have throw rugs in your house? No   Cognitive Testing  Alert? Yes Normal Appearance?Yes  Oriented to person? Yes Place? Yes  Time- no Recall of three objects- no Can perform simple calculations not easily Displays appropriate judgment-mostly Can read the correct time from a watch face?Yes   List the Names of Other Physician/Practitioners you currently use:  See referral list for the physicians patient is currently seeing.  List provided by family   Review of Systems: See above   Objective:     General appearance:  Appears younger than stated age and thin Head: Normocephalic, without obvious abnormality, atraumatic  Eyes: conj clear, EOMi PEERLA  Ears: normal TM's and external ear canals both ears  Nose: Nares normal. Septum midline. Mucosa normal. No drainage or sinus tenderness.  Throat: lips, mucosa, and tongue normal; teeth and gums normal  Neck: no adenopathy, no carotid bruit, no JVD, supple, symmetrical, trachea midline and thyroid not enlarged, symmetric, no tenderness/mass/nodules  No CVA tenderness.  Lungs: clear to auscultation bilaterally  Breasts:Bilateral breast implants Heart: regular rate and rhythm, S1, S2  normal, no murmur is Unchanged and transmitted to carotids bilaterally, click, rub or gallop  Abdomen: soft, non-tender; bowel sounds normal; no masses, no organomegaly  Musculoskeletal: ROM normal in all joints, no crepitus, no deformity, Normal muscle strengthen. Back  is symmetric, no curvature. Skin: Skin color, texture, turgor normal. No rashes or lesions  Lymph nodes: Cervical, supraclavicular, and axillary nodes normal.  Neurologic: CN 2 -12 Normal, Normal symmetric reflexes. Normal coordination and gait  Psych: Alert , Mood appear stable. Some memory loss which is new   Assessment:    Annual wellness medicare exam   Plan:    During the course of the visit the patient was educated and counseled about appropriate screening and preventive services including:  Annual mammogram  Annual flu vaccine      Patient Instructions (the written plan) was given to the patient.  Medicare Attestation  I have personally reviewed:  The patient's medical and social history  Their use of alcohol, tobacco or illicit drugs  Their current medications and supplements  The patient's functional ability including ADLs,fall risks, home safety risks, cognitive, and hearing and visual impairment  Diet and physical activities  Evidence for depression or mood disorders  The patient's weight, height, BMI, and visual acuity have been recorded in the chart. I have made referrals, counseling, and provided education to the patient based on review of the above and I have provided the patient with a written personalized care plan for preventive services.

## 2016-07-25 LAB — URINE CULTURE: ORGANISM ID, BACTERIA: NO GROWTH

## 2016-07-26 ENCOUNTER — Ambulatory Visit (INDEPENDENT_AMBULATORY_CARE_PROVIDER_SITE_OTHER): Payer: Medicare Other | Admitting: Gastroenterology

## 2016-07-26 ENCOUNTER — Encounter: Payer: Self-pay | Admitting: Gastroenterology

## 2016-07-26 ENCOUNTER — Telehealth: Payer: Self-pay | Admitting: *Deleted

## 2016-07-26 ENCOUNTER — Encounter: Payer: Self-pay | Admitting: *Deleted

## 2016-07-26 VITALS — BP 90/60 | HR 60 | Ht 59.5 in | Wt 110.4 lb

## 2016-07-26 DIAGNOSIS — E538 Deficiency of other specified B group vitamins: Secondary | ICD-10-CM | POA: Diagnosis not present

## 2016-07-26 DIAGNOSIS — Z08 Encounter for follow-up examination after completed treatment for malignant neoplasm: Secondary | ICD-10-CM

## 2016-07-26 DIAGNOSIS — R12 Heartburn: Secondary | ICD-10-CM

## 2016-07-26 DIAGNOSIS — I2510 Atherosclerotic heart disease of native coronary artery without angina pectoris: Secondary | ICD-10-CM

## 2016-07-26 DIAGNOSIS — Z85038 Personal history of other malignant neoplasm of large intestine: Secondary | ICD-10-CM

## 2016-07-26 DIAGNOSIS — K219 Gastro-esophageal reflux disease without esophagitis: Secondary | ICD-10-CM

## 2016-07-26 MED ORDER — NA SULFATE-K SULFATE-MG SULF 17.5-3.13-1.6 GM/177ML PO SOLN: 1.0000 | Freq: Once | ORAL | 0 refills | Status: AC

## 2016-07-26 NOTE — Patient Instructions (Signed)
You have been scheduled for an endoscopy and colonoscopy. Please follow the written instructions given to you at your visit today. Please pick up your prep supplies at the pharmacy within the next 1-3 days. If you use inhalers (even only as needed), please bring them with you on the day of your procedure. Your physician has requested that you go to www.startemmi.com and enter the access code given to you at your visit today. This web site gives a general overview about your procedure. However, you should still follow specific instructions given to you by our office regarding your preparation for the procedure.  Stop b12 supplements  Take omeprazole 40 mg 30 minutes before breakfast and dinner  Use Pepcid 20 mg at bedtime

## 2016-07-26 NOTE — Telephone Encounter (Signed)
Done

## 2016-07-26 NOTE — Progress Notes (Signed)
Meghan Oliver    [id number]    04-12-1941  Primary Care Physician:Meghan J, MD  Referring Physician: [name], MD [address] [place], [place] [address]  Chief complaint:  Heartburn, acid reflux, surveillance colonoscopy HPI: 72 yr F with h/o chronic depression, GERD here for follow-up. She was previously followed by Dr. Olevia Oliver and was last seen in February 2015 for constipation. She is accompanied by her husband Meghan Oliver. She has been having increased episodes of heartburn with retrosternal chest pain and acid reflux for the past few months, wakes up with acid taste in the mouth and regurgitation. Denies any dysphagia or odynophagia. Last EGD in May 2005 showed hiatal hernia and benign-appearing esophageal stricture. Last colonoscopy through will of 5 mm tubular adenoma from sigmoid colon in November 2012. Denies any nausea, vomiting, abdominal pain, melena or bright red blood per rectum. She had an episode of severe depression last year that resulted in decreased by mouth intake and activity and has subsequently lost some weight. She is doing better now and her weight is stable in the past few months.     Outpatient Encounter Prescriptions as of 07/26/2016  Medication Sig  . amLODipine (NORVASC) 10 MG tablet Take 1 tablet (10 mg total) by mouth daily.  Marland Kitchen aspirin 81 MG tablet Take 81 mg by mouth daily.    . Calcium Carbonate-Vitamin D (CALCIUM 600/VITAMIN D PO) Take 1 tablet by mouth 2 (two) times daily.   . celecoxib (CELEBREX) 200 MG capsule Take 1 tablet daily  . cetirizine (ZYRTEC) 10 MG tablet Take 10 mg by mouth 3 times/day as needed-between meals & bedtime.   . chlorthalidone (HYGROTON) 25 MG tablet TAKE 1 TABLET BY MOUTH DAILY  . Cholecalciferol (VITAMIN D3) 2000 UNITS TABS Take 2 tablets by mouth daily.   Marland Kitchen docusate-casanthranol (PERICOLACE) 100-30 MG per capsule daily as needed.    Marland Kitchen FLUoxetine (PROZAC) 10 MG capsule Take 10 mg by mouth 3  (three) times daily.   . irbesartan (AVAPRO) 150 MG tablet TAKE 1 TABLET(150 MG) BY MOUTH DAILY  . L-Methylfolate-B12-B6-B2 (CEREFOLIN) 03-04-49-5 MG TABS Take 1 tablet by mouth at bedtime.  Marland Kitchen levothyroxine (SYNTHROID, LEVOTHROID) 75 MCG tablet Take 1 tablet (75 mcg total) by mouth daily.  Marland Kitchen LORazepam (ATIVAN) 0.5 MG tablet Take 0.5 mg by mouth 3 times/day as needed-between meals & bedtime for anxiety.   . Multiple Vitamins-Minerals (MULTIVITAMIN WITH MINERALS) tablet Take 1 tablet by mouth daily.    . Na Sulfate-K Sulfate-Mg Sulf (SUPREP BOWEL PREP KIT) 17.5-3.13-1.6 GM/180ML SOLN Take 1 kit by mouth once.  Marland Kitchen omeprazole (PRILOSEC) 40 MG capsule TAKE 1 CAPSULE BY MOUTH TWICE DAILY  . potassium chloride SA (K-DUR,KLOR-CON) 20 MEQ tablet 2 po daily x 5 days then one po daily.  . primidone (MYSOLINE) 50 MG tablet 2 tabs AM and 1 tab QHS  . RA KRILL OIL 500 MG CAPS Take by mouth.  . rosuvastatin (CRESTOR) 20 MG tablet Take 1 tablet (20 mg total) by mouth daily.  . TOVIAZ 8 MG TB24 tablet TAKE 1 TABLET BY MOUTH DAILY  . traMADol (ULTRAM) 50 MG tablet TAKE 1 TABLET BY MOUTH EVERY 6 TO 8 HOURS AS NEEDED  . valACYclovir (VALTREX) 500 MG tablet Take 1 tablet (500 mg total) by mouth daily.  . vitamin B-12 (CYANOCOBALAMIN) 1000 MCG tablet Take 1,000 mcg by mouth at bedtime.  Marland Kitchen zolpidem (AMBIEN) 5 MG tablet Take 1 tablet (  5 mg total) by mouth at bedtime as needed for sleep.   No facility-administered encounter medications on file as of 07/26/2016.     Allergies as of 07/26/2016 - Review Complete 07/26/2016  Allergen Reaction Noted  . Propranolol Other (See Comments) 08/06/2013  . Brexpiprazole Other (See Comments) 11/27/2015    Past Medical History:  Diagnosis Date  . Anxiety   . Bipolar disease, chronic ([place])   . Carotid bruit   . Carotid disease, bilateral ([place])    carotid dopplers 12/10 with 40-59% bilateral ICA stenosis  . Depression    s/p ECT  . GERD (gastroesophageal reflux disease)   .  Heart murmur   . Hyperlipidemia   . Hypertension   . Low back pain   . Migraines   . Osteoporosis     Past Surgical History:  Procedure Laterality Date  . ABDOMINAL SURGERY     Laparotomy Ovarian cystctomy  . APPENDECTOMY  1971  . AUGMENTATION MAMMAPLASTY    . ESOPHAGOGASTRODUODENOSCOPY  02-26-04  . TUBAL LIGATION      Family History  Problem Relation Age of Onset  . Heart attack Father 33    deceased  . Hypertension Father   . Heart disease Father   . Breast cancer Paternal Grandmother     Age unknown  . Breast cancer Paternal Aunt     Age 87's  . Colon cancer Neg Hx     Social History   Social History  . Marital status: Married    Spouse name: Meghan Oliver  . Number of children: 2  . Years of education: N/A   Occupational History  . housewife Unemployed   Social History Main Topics  . Smoking status: Former Smoker    Packs/day: 2.00    Years: 15.00    Types: Cigarettes    Quit date: 01/13/1971  . Smokeless tobacco: Never Used  . Alcohol use 0.0 oz/week     Comment: wine, three times per week  . Drug use: No  . Sexual activity: No     Comment: intercourse age 87, sexual partners less than 5   Other Topics Concern  . Not on file   Social History Narrative  . No narrative on file      Review of systems: Review of Systems  Constitutional: Negative for fever and chills.  HENT: Negative.   Eyes: Negative for blurred vision.  Respiratory: Negative for cough, shortness of breath and wheezing.   Cardiovascular: Negative for chest pain and palpitations.  Gastrointestinal: as per HPI Genitourinary: Negative for dysuria, urgency, frequency and hematuria.  Musculoskeletal: Negative for myalgias, back pain and joint pain.  Skin: Negative for itching and rash.  Neurological: Negative for dizziness, tremors, focal weakness, seizures and loss of consciousness.  Endo/Heme/Allergies: Positive for seasonal allergies.  Psychiatric/Behavioral: Positive for  depression, negative for suicidal ideas and hallucinations.  All other systems reviewed and are negative.   Physical Exam: Vitals:   07/26/16 1032  BP: 90/60  Pulse: 60   Body mass index is 21.92 kg/m. Gen:      No acute distress HEENT:  EOMI, sclera anicteric Neck:     No masses; no thyromegaly Lungs:    Clear to auscultation bilaterally; normal respiratory effort CV:         Regular rate and rhythm; no murmurs Abd:      + bowel sounds; soft, non-tender; no palpable masses, no distension Ext:    No edema; adequate peripheral perfusion Skin:  Warm and dry; no rash Neuro: alert and oriented x 3 Psych: normal mood and affect  Data Reviewed:  Reviewed labs, radiology imaging, old records and pertinent past GI work up   Assessment and Plan/Recommendations: 75 year old female with history of chronic depression & GERD here for follow-up visit with complaints of worsening GERD symptoms in the past few months Continue omeprazole 30 minutes before breakfast and dinner We will add Pepcid 40 mg at bedtime to improve nocturnal symptoms We'll follow antireflux measures Schedule for EGD for evaluation given recent worsening of symptoms to exclude esophagitis, malignancy Patient is also due for surveillance colonoscopy, I'll schedule both EGD and colonoscopy at the same time Noted really high B12 level on labs done in September 2017, had a few other abnormalities in electrolytes and low albumin of 2, that has improved on repeat check in October to 4.3 Possible that the sample on September 12 was hemolyzed, will recheck B12  stop taking B12 supplements The risks and benefits as well as alternatives of endoscopic procedure(s) have been discussed and reviewed. All questions answered. The patient agrees to proceed.   25 minutes was spent face-to-face with the patient. Greater than 50% of the time used for counseling as well as treatment plan and follow-up.   Damaris Hippo ,  MD [phone] Mon-Fri 8a-5p [phone] after 5p, weekends, holidays  CC: Meghan Showers, MD

## 2016-07-26 NOTE — Telephone Encounter (Signed)
-----   Message from [name], MD sent at 07/24/2016  2:36 PM EDT ----- Please mail results

## 2016-07-27 ENCOUNTER — Encounter: Payer: Self-pay | Admitting: Gynecology

## 2016-07-27 NOTE — Telephone Encounter (Signed)
Repeat Calcium level 07/23/16  9.6 . Talked with [name] her Nurse  Appointment 07/29/16 at 12 noon for injection. She will call office if she needs to come later this day. I also told her about the insurance information $0 cost to patient.

## 2016-07-29 ENCOUNTER — Ambulatory Visit (INDEPENDENT_AMBULATORY_CARE_PROVIDER_SITE_OTHER): Payer: Medicare Other | Admitting: *Deleted

## 2016-07-29 DIAGNOSIS — M810 Age-related osteoporosis without current pathological fracture: Secondary | ICD-10-CM | POA: Diagnosis not present

## 2016-07-29 DIAGNOSIS — Z23 Encounter for immunization: Secondary | ICD-10-CM

## 2016-07-29 MED ORDER — DENOSUMAB 60 MG/ML ~~LOC~~ SOLN
60.0000 mg | Freq: Once | SUBCUTANEOUS | Status: AC
  Administered 2016-07-29: 60 mg via SUBCUTANEOUS

## 2016-08-03 NOTE — Telephone Encounter (Signed)
Prolia given 07/29/16  Next due 01/28/17

## 2016-08-04 ENCOUNTER — Ambulatory Visit (AMBULATORY_SURGERY_CENTER): Payer: Medicare Other | Admitting: Gastroenterology

## 2016-08-04 ENCOUNTER — Encounter: Payer: Self-pay | Admitting: Gastroenterology

## 2016-08-04 VITALS — BP 146/76 | HR 65 | Temp 98.4°F | Resp 12 | Ht 59.0 in | Wt 110.0 lb

## 2016-08-04 DIAGNOSIS — D125 Benign neoplasm of sigmoid colon: Secondary | ICD-10-CM | POA: Diagnosis not present

## 2016-08-04 DIAGNOSIS — K219 Gastro-esophageal reflux disease without esophagitis: Secondary | ICD-10-CM

## 2016-08-04 DIAGNOSIS — K210 Gastro-esophageal reflux disease with esophagitis: Secondary | ICD-10-CM | POA: Diagnosis not present

## 2016-08-04 DIAGNOSIS — Z86010 Personal history of colonic polyps: Secondary | ICD-10-CM

## 2016-08-04 DIAGNOSIS — Z1211 Encounter for screening for malignant neoplasm of colon: Secondary | ICD-10-CM

## 2016-08-04 DIAGNOSIS — R12 Heartburn: Secondary | ICD-10-CM | POA: Diagnosis not present

## 2016-08-04 DIAGNOSIS — K3189 Other diseases of stomach and duodenum: Secondary | ICD-10-CM | POA: Diagnosis not present

## 2016-08-04 MED ORDER — SODIUM CHLORIDE 0.9 % IV SOLN: 500.0000 mL | INTRAVENOUS | Status: DC

## 2016-08-04 MED ORDER — ESOMEPRAZOLE MAGNESIUM 40 MG PO CPDR: 40.0000 mg | DELAYED_RELEASE_CAPSULE | Freq: Two times a day (BID) | ORAL | 3 refills | Status: DC

## 2016-08-04 NOTE — Progress Notes (Signed)
Called to room to assist during endoscopic procedure.  Patient ID and intended procedure confirmed with present staff. Received instructions for my participation in the procedure from the performing physician.  

## 2016-08-04 NOTE — Op Note (Signed)
[facility] Patient Name: Meghan Oliver Procedure Date: 08/04/2016 1:30 PM MRN: [id number] Endoscopist: Mauri Pole , MD Age: 75 Referring MD:  Date of Birth: 11/28/1940 Gender: Female Account #: [id number] Procedure:                Upper GI endoscopy Indications:              Esophageal reflux symptoms that recur despite                            appropriate therapy Medicines:                Monitored Anesthesia Care Procedure:                Pre-Anesthesia Assessment:                           - Prior to the procedure, a History and Physical                            was performed, and patient medications and                            allergies were reviewed. The patient's tolerance of                            previous anesthesia was also reviewed. The risks                            and benefits of the procedure and the sedation                            options and risks were discussed with the patient.                            All questions were answered, and informed consent                            was obtained. Prior Anticoagulants: The patient has                            taken no previous anticoagulant or antiplatelet                            agents. ASA Grade Assessment: II - A patient with                            mild systemic disease. After reviewing the risks                            and benefits, the patient was deemed in                            satisfactory condition to undergo the procedure.                           -  Prior to the procedure, a History and Physical                            was performed, and patient medications and                            allergies were reviewed. The patient's tolerance of                            previous anesthesia was also reviewed. The risks                            and benefits of the procedure and the sedation                            options and risks were discussed with the  patient.                            All questions were answered, and informed consent                            was obtained. Prior Anticoagulants: The patient has                            taken no previous anticoagulant or antiplatelet                            agents. ASA Grade Assessment: II - A patient with                            mild systemic disease. After reviewing the risks                            and benefits, the patient was deemed in                            satisfactory condition to undergo the procedure.                           After obtaining informed consent, the endoscope was                            passed under direct vision. Throughout the                            procedure, the patient's blood pressure, pulse, and                            oxygen saturations were monitored continuously. The                            Model [id number]) scope was introduced  through the mouth, and advanced to the second part                            of duodenum. The upper GI endoscopy was                            accomplished without difficulty. The patient                            tolerated the procedure well. Scope In: Scope Out: Findings:                 Three linear esophageal ulcers with no bleeding and                            stigmata of recent bleeding were found 32 to 34 cm                            from the incisors. The largest lesion was 2 mm in                            largest dimension. Biopsies were taken with a cold                            forceps for histology.                           A 3 cm hiatal hernia was present.                           White nummular lesions were noted in the lower                            third of the esophagus, exclude candidiasis vs                            acanthosis. Biopsies were taken with a cold forceps                            for histology.                            Diffuse moderately erythematous and edematous                            mucosa without bleeding was found in the entire                            examined stomach. Biopsies were taken with a cold                            forceps for histology and Helicobacter pylori  testing.                           A few localized, less than 5 mm non-bleeding                            erosions were found in the gastric antrum. There                            were no stigmata of recent bleeding.                           The examined duodenum was normal. Complications:            No immediate complications. Estimated Blood Loss:     Estimated blood loss was minimal. Impression:               - Non-bleeding esophageal ulcers. Biopsied.                           - 3 cm hiatal hernia.                           - White nummular lesions in esophageal mucosa.                            Biopsied.                           - Erythematous mucosa in the stomach. Biopsied.                           - Non-bleeding erosive gastropathy.                           - Normal examined duodenum. Recommendation:           - Patient has a contact number available for                            emergencies. The signs and symptoms of potential                            delayed complications were discussed with the                            patient. Return to normal activities tomorrow.                            Written discharge instructions were provided to the                            patient.                           - Patient has a contact number available for  emergencies. The signs and symptoms of potential                            delayed complications were discussed with the                            patient. Return to normal activities tomorrow.                            Written discharge instructions were provided to the                             patient.                           - Resume previous diet.                           - Continue present medications.                           - Await pathology results.                           - No aspirin, ibuprofen, naproxen, or other                            non-steroidal anti-inflammatory drugs.                           - Use Nexium (esomeprazole) 40 mg PO BID. Mauri Pole, MD 08/04/2016 2:48:23 PM This report has been signed electronically.

## 2016-08-04 NOTE — Patient Instructions (Signed)
YOU HAD AN ENDOSCOPIC PROCEDURE TODAY AT [facility]:   Refer to the procedure report that was given to you for any specific questions about what was found during the examination.  If the procedure report does not answer your questions, please call your gastroenterologist to clarify.  If you requested that your care partner not be given the details of your procedure findings, then the procedure report has been included in a sealed envelope for you to review at your convenience later.  YOU SHOULD EXPECT: Some feelings of bloating in the abdomen. Passage of more gas than usual.  Walking can help get rid of the air that was put into your GI tract during the procedure and reduce the bloating. If you had a lower endoscopy (such as a colonoscopy or flexible sigmoidoscopy) you may notice spotting of blood in your stool or on the toilet paper. If you underwent a bowel prep for your procedure, you may not have a normal bowel movement for a few days.  Please Note:  You might notice some irritation and congestion in your nose or some drainage.  This is from the oxygen used during your procedure.  There is no need for concern and it should clear up in a day or so.  SYMPTOMS TO REPORT IMMEDIATELY:   Following lower endoscopy (colonoscopy or flexible sigmoidoscopy):  Excessive amounts of blood in the stool  Significant tenderness or worsening of abdominal pains  Swelling of the abdomen that is new, acute  Fever of 100F or higher   Following upper endoscopy (EGD)  Vomiting of blood or coffee ground material  New chest pain or pain under the shoulder blades  Painful or persistently difficult swallowing  New shortness of breath  Fever of 100F or higher  Black, tarry-looking stools  For urgent or emergent issues, a gastroenterologist can be reached at any hour by calling [phone].   DIET:  We do recommend a small meal at first, but then you may proceed to your regular diet.  Drink  plenty of fluids but you should avoid alcoholic beverages for 24 hours. Increase the fiber in your diet, and drink plenty of water.  ACTIVITY:  You should plan to take it easy for the rest of today and you should NOT DRIVE or use heavy machinery until tomorrow (because of the sedation medicines used during the test).    FOLLOW UP: Our staff will call the number listed on your records the next business day following your procedure to check on you and address any questions or concerns that you may have regarding the information given to you following your procedure. If we do not reach you, we will leave a message.  However, if you are feeling well and you are not experiencing any problems, there is no need to return our call.  We will assume that you have returned to your regular daily activities without incident.  If any biopsies were taken you will be contacted by phone or by letter within the next 1-3 weeks.  Please call us at [phone] if you have not heard about the biopsies in 3 weeks.    SIGNATURES/CONFIDENTIALITY: You and/or your care partner have signed paperwork which will be entered into your electronic medical record.  These signatures attest to the fact that that the information above on your After Visit Summary has been reviewed and is understood.  Full responsibility of the confidentiality of this discharge information lies with you and/or your care-partner.  Read all of the handouts given to you by your recovery room nurse.  Thank-you for choosing [name] for your healthcare needs today.  Take your nexium 40mg  twice daily 1/2 hour before breakfast.

## 2016-08-04 NOTE — Op Note (Signed)
[facility] Patient Name: Meghan Oliver Procedure Date: 08/04/2016 1:29 PM MRN: [id number] Endoscopist: Mauri Pole , MD Age: 75 Referring MD:  Date of Birth: [birth year] Gender: Female Account #: [id number] Procedure:                Colonoscopy Indications:              Surveillance: Personal history of adenomatous                            polyps on last colonoscopy 5 years ago Medicines:                Monitored Anesthesia Care Procedure:                Pre-Anesthesia Assessment:                           - Prior to the procedure, a History and Physical                            was performed, and patient medications and                            allergies were reviewed. The patient's tolerance of                            previous anesthesia was also reviewed. The risks                            and benefits of the procedure and the sedation                            options and risks were discussed with the patient.                            All questions were answered, and informed consent                            was obtained. Prior Anticoagulants: The patient has                            taken no previous anticoagulant or antiplatelet                            agents. ASA Grade Assessment: II - A patient with                            mild systemic disease. After reviewing the risks                            and benefits, the patient was deemed in                            satisfactory condition to undergo the procedure.  After obtaining informed consent, the colonoscope                            was passed under direct vision. Throughout the                            procedure, the patient's blood pressure, pulse, and                            oxygen saturations were monitored continuously. The                            Model [id number]) scope was introduced                            through the anus and  advanced to the the terminal                            ileum, with identification of the appendiceal                            orifice and IC valve. The colonoscopy was somewhat                            difficult due to inadequate bowel prep and a                            tortuous colon. Successful completion of the                            procedure was aided by lavage. The patient                            tolerated the procedure well. The quality of the                            bowel preparation was adequate. The terminal ileum,                            ileocecal valve, appendiceal orifice, and rectum                            were photographed. Scope In: 1:52:38 PM Scope Out: 2:33:48 PM Scope Withdrawal Time: 0 hours 21 minutes 11 seconds  Total Procedure Duration: 0 hours 41 minutes 10 seconds  Findings:                 The perianal and digital rectal examinations were                            normal. Weak anal sphincter tone                           A 2 mm polyp was found in the sigmoid colon. The  polyp was sessile. The polyp was removed with a                            cold biopsy forceps. Resection and retrieval were                            complete.                           A few small and large-mouthed diverticula were                            found in the sigmoid colon.                           The exam was otherwise without abnormality. Complications:            No immediate complications. Estimated Blood Loss:     Estimated blood loss was minimal. Impression:               - One 2 mm polyp in the sigmoid colon, removed with                            a cold biopsy forceps. Resected and retrieved.                           - Diverticulosis in the sigmoid colon.                           - The examination was otherwise normal. Recommendation:           - Patient has a contact number available for                             emergencies. The signs and symptoms of potential                            delayed complications were discussed with the                            patient. Return to normal activities tomorrow.                            Written discharge instructions were provided to the                            patient.                           - Resume previous diet.                           - Continue present medications.                           - Await pathology results.                           -  Repeat colonoscopy in 5-10 years for surveillance                            based on pathology results.                           - Return to GI clinic PRN.                           -Can consider Kegel exercises and referral to                            pelvic floor PT to improve anal sphincter tone Mauri Pole, MD 08/04/2016 2:43:23 PM This report has been signed electronically.

## 2016-08-04 NOTE — Progress Notes (Signed)
A and O x3. Report to RN. Tolerated MAC anesthesia well.Teeth unchanged after procedure. 

## 2016-08-05 ENCOUNTER — Telehealth: Payer: Self-pay

## 2016-08-05 NOTE — Telephone Encounter (Signed)
  Follow up Call-  Call back number 08/04/2016  Post procedure Call Back phone  # [phone]  Permission to leave phone message Yes  Some recent data might be hidden     Patient questions:  Do you have a fever, pain , or abdominal swelling? No. Pain Score  0 *  Have you tolerated food without any problems? Yes.    Have you been able to return to your normal activities? Yes.    Do you have any questions about your discharge instructions: Diet   No. Medications  No. Follow up visit  No.  Do you have questions or concerns about your Care? No.  Actions: * If pain score is 4 or above: No action needed, pain <4.  I spoke with pt's husband, Dr. Lyla Son.  He reported his wife had no problems.  He thanked [name] for giving excellent care to his wife.  maw

## 2016-08-09 ENCOUNTER — Encounter: Payer: Self-pay | Admitting: Gastroenterology

## 2016-08-09 DIAGNOSIS — F3341 Major depressive disorder, recurrent, in partial remission: Secondary | ICD-10-CM | POA: Diagnosis not present

## 2016-08-11 DIAGNOSIS — F332 Major depressive disorder, recurrent severe without psychotic features: Secondary | ICD-10-CM | POA: Diagnosis not present

## 2016-08-16 ENCOUNTER — Encounter: Payer: Self-pay | Admitting: Gastroenterology

## 2016-08-27 ENCOUNTER — Other Ambulatory Visit: Payer: Self-pay | Admitting: Internal Medicine

## 2016-09-01 DIAGNOSIS — F3341 Major depressive disorder, recurrent, in partial remission: Secondary | ICD-10-CM | POA: Diagnosis not present

## 2016-09-01 NOTE — Patient Instructions (Signed)
It was pleasure to see you today. Continue same medications and return in 6 months. 

## 2016-09-02 ENCOUNTER — Telehealth: Payer: Self-pay | Admitting: Internal Medicine

## 2016-09-02 ENCOUNTER — Encounter: Payer: Self-pay | Admitting: Internal Medicine

## 2016-09-02 ENCOUNTER — Other Ambulatory Visit: Payer: Medicare Other | Admitting: Internal Medicine

## 2016-09-02 DIAGNOSIS — F3130 Bipolar disorder, current episode depressed, mild or moderate severity, unspecified: Secondary | ICD-10-CM | POA: Diagnosis not present

## 2016-09-02 DIAGNOSIS — Z79899 Other long term (current) drug therapy: Secondary | ICD-10-CM

## 2016-09-02 DIAGNOSIS — F3181 Bipolar II disorder: Secondary | ICD-10-CM | POA: Diagnosis not present

## 2016-09-02 LAB — CBC WITH DIFFERENTIAL/PLATELET
BASOS PCT: 0 %
Basophils Absolute: 0 cells/uL (ref 0–200)
EOS PCT: 1 %
Eosinophils Absolute: 75 cells/uL (ref 15–500)
HCT: 37.6 % (ref 35.0–45.0)
Hemoglobin: 12.1 g/dL (ref 11.7–15.5)
Lymphocytes Relative: 27 %
Lymphs Abs: 2025 cells/uL (ref 850–3900)
MCH: 31.7 pg (ref 27.0–33.0)
MCHC: 32.2 g/dL (ref 32.0–36.0)
MCV: 98.4 fL (ref 80.0–100.0)
MONOS PCT: 6 %
MPV: 9.4 fL (ref 7.5–12.5)
Monocytes Absolute: 450 cells/uL (ref 200–950)
NEUTROS ABS: 4950 {cells}/uL (ref 1500–7800)
Neutrophils Relative %: 66 %
PLATELETS: 286 10*3/uL (ref 140–400)
RBC: 3.82 MIL/uL (ref 3.80–5.10)
RDW: 13.6 % (ref 11.0–15.0)
WBC: 7.5 10*3/uL (ref 3.8–10.8)

## 2016-09-02 LAB — COMPLETE METABOLIC PANEL WITH GFR
ALBUMIN: 4.5 g/dL (ref 3.6–5.1)
ALK PHOS: 23 U/L — AB (ref 33–130)
ALT: 22 U/L (ref 6–29)
AST: 18 U/L (ref 10–35)
BUN: 22 mg/dL (ref 7–25)
CALCIUM: 10.3 mg/dL (ref 8.6–10.4)
CO2: 25 mmol/L (ref 20–31)
Chloride: 99 mmol/L (ref 98–110)
Creat: 1.14 mg/dL — ABNORMAL HIGH (ref 0.60–0.93)
GFR, EST NON AFRICAN AMERICAN: 47 mL/min — AB (ref >=60)
GFR, Est African American: 54 mL/min — ABNORMAL LOW (ref >=60)
Glucose, Bld: 99 mg/dL (ref 65–99)
POTASSIUM: 4.3 mmol/L (ref 3.5–5.3)
Sodium: 133 mmol/L — ABNORMAL LOW (ref 135–146)
Total Bilirubin: 0.4 mg/dL (ref 0.2–1.2)
Total Protein: 6.8 g/dL (ref 6.1–8.1)

## 2016-09-02 NOTE — Telephone Encounter (Signed)
Received call from Bary Leriche, caretaker, that patient needs to have lab work drawn per [name] to monitor medication. They're requesting nortriptyline level, TSH, CBC with diff, C met and lithium panel. Results to be followed did to Dr. Dina Rich. Labs drawn.

## 2016-09-03 ENCOUNTER — Encounter: Payer: Self-pay | Admitting: Internal Medicine

## 2016-09-03 DIAGNOSIS — F332 Major depressive disorder, recurrent severe without psychotic features: Secondary | ICD-10-CM | POA: Diagnosis not present

## 2016-09-03 DIAGNOSIS — R569 Unspecified convulsions: Secondary | ICD-10-CM | POA: Diagnosis not present

## 2016-09-03 LAB — LITHIUM LEVEL: Lithium Lvl: 0.5 mmol/L — ABNORMAL LOW (ref 0.6–1.2)

## 2016-09-03 LAB — TSH: TSH: 2.68 m[IU]/L

## 2016-09-04 LAB — NORTRIPTYLINE LEVEL: NORTRIPTYLINE LVL: 42 ug/L — AB (ref 50–150)

## 2016-09-06 ENCOUNTER — Encounter: Payer: Self-pay | Admitting: Internal Medicine

## 2016-09-21 DIAGNOSIS — M5431 Sciatica, right side: Secondary | ICD-10-CM | POA: Diagnosis not present

## 2016-09-21 DIAGNOSIS — M5136 Other intervertebral disc degeneration, lumbar region: Secondary | ICD-10-CM | POA: Diagnosis not present

## 2016-09-21 DIAGNOSIS — M4317 Spondylolisthesis, lumbosacral region: Secondary | ICD-10-CM | POA: Diagnosis not present

## 2016-09-23 DIAGNOSIS — M4316 Spondylolisthesis, lumbar region: Secondary | ICD-10-CM | POA: Diagnosis not present

## 2016-09-24 ENCOUNTER — Other Ambulatory Visit: Payer: Self-pay | Admitting: *Deleted

## 2016-09-24 DIAGNOSIS — K219 Gastro-esophageal reflux disease without esophagitis: Secondary | ICD-10-CM

## 2016-09-24 MED ORDER — ESOMEPRAZOLE MAGNESIUM 40 MG PO CPDR: 40.0000 mg | DELAYED_RELEASE_CAPSULE | Freq: Two times a day (BID) | ORAL | 3 refills | Status: DC

## 2016-10-07 DIAGNOSIS — M4805 Spinal stenosis, thoracolumbar region: Secondary | ICD-10-CM | POA: Diagnosis not present

## 2016-10-07 DIAGNOSIS — M5415 Radiculopathy, thoracolumbar region: Secondary | ICD-10-CM | POA: Diagnosis not present

## 2016-10-13 DIAGNOSIS — F314 Bipolar disorder, current episode depressed, severe, without psychotic features: Secondary | ICD-10-CM | POA: Diagnosis not present

## 2016-10-28 DIAGNOSIS — M5137 Other intervertebral disc degeneration, lumbosacral region: Secondary | ICD-10-CM | POA: Diagnosis not present

## 2016-10-28 DIAGNOSIS — M5416 Radiculopathy, lumbar region: Secondary | ICD-10-CM | POA: Diagnosis not present

## 2016-11-10 DIAGNOSIS — I959 Hypotension, unspecified: Secondary | ICD-10-CM | POA: Diagnosis not present

## 2016-11-10 DIAGNOSIS — R69 Illness, unspecified: Secondary | ICD-10-CM | POA: Diagnosis not present

## 2016-11-10 DIAGNOSIS — R4701 Aphasia: Secondary | ICD-10-CM | POA: Diagnosis not present

## 2016-11-10 DIAGNOSIS — I10 Essential (primary) hypertension: Secondary | ICD-10-CM | POA: Diagnosis not present

## 2016-11-10 DIAGNOSIS — K219 Gastro-esophageal reflux disease without esophagitis: Secondary | ICD-10-CM | POA: Diagnosis not present

## 2016-11-10 DIAGNOSIS — R410 Disorientation, unspecified: Secondary | ICD-10-CM | POA: Diagnosis not present

## 2016-11-10 DIAGNOSIS — E860 Dehydration: Secondary | ICD-10-CM | POA: Diagnosis not present

## 2016-11-10 DIAGNOSIS — E785 Hyperlipidemia, unspecified: Secondary | ICD-10-CM | POA: Diagnosis not present

## 2016-11-10 DIAGNOSIS — R531 Weakness: Secondary | ICD-10-CM | POA: Diagnosis not present

## 2016-11-10 DIAGNOSIS — T56891A Toxic effect of other metals, accidental (unintentional), initial encounter: Secondary | ICD-10-CM | POA: Diagnosis not present

## 2016-11-10 DIAGNOSIS — E871 Hypo-osmolality and hyponatremia: Secondary | ICD-10-CM | POA: Diagnosis not present

## 2016-11-10 DIAGNOSIS — F3181 Bipolar II disorder: Secondary | ICD-10-CM | POA: Diagnosis not present

## 2016-11-10 DIAGNOSIS — E039 Hypothyroidism, unspecified: Secondary | ICD-10-CM | POA: Diagnosis not present

## 2016-11-10 DIAGNOSIS — I951 Orthostatic hypotension: Secondary | ICD-10-CM | POA: Diagnosis not present

## 2016-11-11 DIAGNOSIS — F312 Bipolar disorder, current episode manic severe with psychotic features: Secondary | ICD-10-CM | POA: Diagnosis not present

## 2016-11-11 DIAGNOSIS — I959 Hypotension, unspecified: Secondary | ICD-10-CM | POA: Diagnosis not present

## 2016-11-11 DIAGNOSIS — G459 Transient cerebral ischemic attack, unspecified: Secondary | ICD-10-CM | POA: Diagnosis not present

## 2016-11-11 DIAGNOSIS — E871 Hypo-osmolality and hyponatremia: Secondary | ICD-10-CM | POA: Diagnosis not present

## 2016-11-11 DIAGNOSIS — F331 Major depressive disorder, recurrent, moderate: Secondary | ICD-10-CM | POA: Diagnosis not present

## 2016-11-11 DIAGNOSIS — H10219 Acute toxic conjunctivitis, unspecified eye: Secondary | ICD-10-CM | POA: Diagnosis not present

## 2016-11-11 DIAGNOSIS — N170 Acute kidney failure with tubular necrosis: Secondary | ICD-10-CM | POA: Diagnosis not present

## 2016-11-15 ENCOUNTER — Other Ambulatory Visit: Payer: Self-pay | Admitting: Internal Medicine

## 2016-11-16 ENCOUNTER — Telehealth: Payer: Self-pay | Admitting: Internal Medicine

## 2016-11-16 ENCOUNTER — Other Ambulatory Visit: Payer: Self-pay | Admitting: Internal Medicine

## 2016-11-16 DIAGNOSIS — E871 Hypo-osmolality and hyponatremia: Secondary | ICD-10-CM | POA: Diagnosis not present

## 2016-11-16 DIAGNOSIS — R5381 Other malaise: Secondary | ICD-10-CM | POA: Diagnosis not present

## 2016-11-16 DIAGNOSIS — R7889 Finding of other specified substances, not normally found in blood: Secondary | ICD-10-CM | POA: Diagnosis not present

## 2016-11-16 DIAGNOSIS — R5383 Other fatigue: Secondary | ICD-10-CM | POA: Diagnosis not present

## 2016-11-16 LAB — COMPREHENSIVE METABOLIC PANEL
ALBUMIN: 4.3 g/dL (ref 3.6–5.1)
ALK PHOS: 50 U/L (ref 33–130)
ALT: 38 U/L — AB (ref 6–29)
AST: 26 U/L (ref 10–35)
BILIRUBIN TOTAL: 0.2 mg/dL (ref 0.2–1.2)
BUN: 19 mg/dL (ref 7–25)
CALCIUM: 9.1 mg/dL (ref 8.6–10.4)
CO2: 24 mmol/L (ref 20–31)
Chloride: 97 mmol/L — ABNORMAL LOW (ref 98–110)
Creat: 0.99 mg/dL — ABNORMAL HIGH (ref 0.60–0.93)
Glucose, Bld: 99 mg/dL (ref 65–99)
POTASSIUM: 4.3 mmol/L (ref 3.5–5.3)
Sodium: 131 mmol/L — ABNORMAL LOW (ref 135–146)
Total Protein: 6.4 g/dL (ref 6.1–8.1)

## 2016-11-16 LAB — CBC WITH DIFFERENTIAL/PLATELET
Basophils Absolute: 0 cells/uL (ref 0–200)
Basophils Relative: 0 %
EOS ABS: 73 {cells}/uL (ref 15–500)
Eosinophils Relative: 1 %
HEMATOCRIT: 33.6 % — AB (ref 35.0–45.0)
HEMOGLOBIN: 11.2 g/dL — AB (ref 11.7–15.5)
LYMPHS ABS: 1971 {cells}/uL (ref 850–3900)
Lymphocytes Relative: 27 %
MCH: 32.3 pg (ref 27.0–33.0)
MCHC: 33.3 g/dL (ref 32.0–36.0)
MCV: 96.8 fL (ref 80.0–100.0)
MONO ABS: 584 {cells}/uL (ref 200–950)
MPV: 9.2 fL (ref 7.5–12.5)
Monocytes Relative: 8 %
NEUTROS PCT: 64 %
Neutro Abs: 4672 cells/uL (ref 1500–7800)
Platelets: 324 10*3/uL (ref 140–400)
RBC: 3.47 MIL/uL — AB (ref 3.80–5.10)
RDW: 13.1 % (ref 11.0–15.0)
WBC: 7.3 10*3/uL (ref 3.8–10.8)

## 2016-11-16 NOTE — Telephone Encounter (Signed)
Randol Kern, RN for the patient is calling; states that patient has been in [facility] for several weeks.  States patient has some Lithium toxicity last week.  She had some labs drawn at the [facility] in [place], [place].  However, states that the hospital has been less than helpful.  Hospital told [name] that it could take up to 2 weeks for them to get the labs to her for follow-up purposes for patient back here in [place].  Dr. Inocente Salles has not been helpful to [facility] either in terms of providing clinical information on patient either given her day to day clinical, etc.  He just said that her Lithium is out of kilter again.  She is to have ECT procedure tomorrow and to see Psychatrist on Thursday.  Since [name] has been able to get labs from [facility], she was wanting to know if she could bring the patient here to have labs drawn today or tomorrow.    Advised that our Lab Tech leaves at 1pm today and will not be back until Thursday.  Patient is a very hard stick.  Explained that we have been without nurse since July.  Advised that we would be happy to assist til 1pm today.  She further advised that they are not flying in from [facility] until after 2pm today.  Advised that I would speak to Dr. Renold Genta and see how we could be of assistance to her.    Spoke with Dr. Renold Genta and she advised that she'll be happy to write an order for patient to take to [place] on [address] to have labs drawn, but we will not be able to draw labs this afternoon due to the degree of difficulty that the patient is to stick.    Wrote an order for CBC w/diff, CMET, Lithium Level.  Dx:  R78.89, E87.1, R53.83, R53.81.  Scanned the order into EPIC under media.    Lynnae Sandhoff back @ [phone] to advise order is ready for pick up.  She'll be in this afternoon to pick up order.

## 2016-11-17 DIAGNOSIS — F314 Bipolar disorder, current episode depressed, severe, without psychotic features: Secondary | ICD-10-CM | POA: Diagnosis not present

## 2016-11-17 LAB — LITHIUM LEVEL: LITHIUM LVL: 0.3 mmol/L — AB (ref 0.6–1.2)

## 2016-11-18 DIAGNOSIS — F3341 Major depressive disorder, recurrent, in partial remission: Secondary | ICD-10-CM | POA: Diagnosis not present

## 2016-11-23 ENCOUNTER — Telehealth: Payer: Self-pay

## 2016-11-23 ENCOUNTER — Telehealth: Payer: Self-pay | Admitting: Gynecology

## 2016-11-23 DIAGNOSIS — M810 Age-related osteoporosis without current pathological fracture: Secondary | ICD-10-CM

## 2016-11-23 DIAGNOSIS — M8589 Other specified disorders of bone density and structure, multiple sites: Secondary | ICD-10-CM

## 2016-11-23 NOTE — Telephone Encounter (Signed)
Prolia is not covered by Medicare for m85.89  . Reclast is covered for osteopenia. Patient needs complete exam by Elon Alas , last complete 2016. Prolia is due after 01/30/17. Izora Gala informed about Prolia.  Prior to infusion pt will need labs within a 30 day window.

## 2016-11-23 NOTE — Telephone Encounter (Signed)
I received a staff message from [place] that was part of a message with [facility]. Message Read "Please have her schedule appt with me, Prolia not longer covered, we can discuss Reclast. She has not been in lately".  I asked Chauncey Reading. To call her to schedule visit and Butch Penny called but no answer and no ans machine. I sent her a letter today asking her to call. Copy of letter is in her chart.

## 2016-11-24 DIAGNOSIS — F419 Anxiety disorder, unspecified: Secondary | ICD-10-CM | POA: Diagnosis not present

## 2016-11-24 DIAGNOSIS — F319 Bipolar disorder, unspecified: Secondary | ICD-10-CM | POA: Diagnosis not present

## 2016-11-24 DIAGNOSIS — R4182 Altered mental status, unspecified: Secondary | ICD-10-CM | POA: Diagnosis not present

## 2016-11-24 DIAGNOSIS — R410 Disorientation, unspecified: Secondary | ICD-10-CM | POA: Diagnosis not present

## 2016-11-24 DIAGNOSIS — R531 Weakness: Secondary | ICD-10-CM | POA: Diagnosis not present

## 2016-11-24 NOTE — Telephone Encounter (Signed)
Reclast coverage note from Dianna Rossetti need to call on this. Medicare covers this and her secondary picks up what Medicare doesn't pay. No PA needed. KW CMA

## 2016-11-24 NOTE — Telephone Encounter (Signed)
[place] care giver phone number .

## 2016-11-28 ENCOUNTER — Other Ambulatory Visit: Payer: Self-pay | Admitting: Internal Medicine

## 2016-11-28 NOTE — Telephone Encounter (Signed)
Refill x one year each Rx

## 2016-12-02 ENCOUNTER — Telehealth (HOSPITAL_COMMUNITY): Payer: Self-pay | Admitting: *Deleted

## 2016-12-02 MED ORDER — SPIRONOLACTONE 25 MG PO TABS: 12.5000 mg | ORAL_TABLET | Freq: Every day | ORAL | 3 refills | Status: DC

## 2016-12-02 NOTE — Telephone Encounter (Signed)
[name] called and discussed medications and plan of care with Dr. Aundra Dubin. Dr. Aundra Dubin has ordered to stop Chlorthalidone due to lithium toxicity, ordered 12.5 mg (0.5 Tablet) of Spironolactone Once Daily, bmet in 2 weeks, follow up in April, and record daily blood pressure readings and have patient call those in to our office in 2 weeks.   I called and spoke with Randol Kern, RN for patient and she is aware of the plan of care.  [name] did inform us that patient will remain in [place] until the middle of April but she takes care of all patient's medications and sens them to her each week.  [name] will talk with [name] regarding the bmet in 2 weeks and will have results sent to our office.  [name] said she will try her best to have patient's BP check daily but it may be difficult with patient being in [place].  [name] did say there is someone there that helps the family in the home.  The follow up appointment was made for April 20th with Dr. Aundra Dubin in which [name] will be with patient at appointment time.  No further questions at this time.

## 2016-12-08 NOTE — Telephone Encounter (Signed)
Phone call to home and [employer] no answer , no available VM

## 2016-12-08 NOTE — Telephone Encounter (Signed)
Meghan Oliver please send a letter to call concerning osteoporosis treatment.

## 2016-12-16 ENCOUNTER — Encounter: Payer: Medicare Other | Admitting: Psychology

## 2016-12-20 DIAGNOSIS — Z79899 Other long term (current) drug therapy: Secondary | ICD-10-CM | POA: Diagnosis not present

## 2016-12-20 DIAGNOSIS — F333 Major depressive disorder, recurrent, severe with psychotic symptoms: Secondary | ICD-10-CM | POA: Diagnosis not present

## 2016-12-22 NOTE — Telephone Encounter (Signed)
Juliann Pulse sent a letter 11/23/16

## 2017-01-10 NOTE — Telephone Encounter (Signed)
Note in chart states that patient is in [place]. I will inform Elon Alas. WE have called numerous times and also letter has been sent. We are waiting on patient to call us . No return messages from patient have been received regarding letter.

## 2017-01-13 ENCOUNTER — Other Ambulatory Visit: Payer: Self-pay | Admitting: Cardiology

## 2017-01-14 ENCOUNTER — Other Ambulatory Visit: Payer: Self-pay | Admitting: Neurology

## 2017-01-17 ENCOUNTER — Telehealth: Payer: Self-pay | Admitting: Gastroenterology

## 2017-01-18 ENCOUNTER — Encounter: Payer: Medicare Other | Admitting: Psychology

## 2017-01-18 NOTE — Telephone Encounter (Signed)
noted 

## 2017-01-19 DIAGNOSIS — F3341 Major depressive disorder, recurrent, in partial remission: Secondary | ICD-10-CM | POA: Diagnosis not present

## 2017-01-21 ENCOUNTER — Encounter (HOSPITAL_COMMUNITY): Payer: Medicare Other

## 2017-01-25 ENCOUNTER — Telehealth: Payer: Self-pay | Admitting: Gastroenterology

## 2017-01-25 ENCOUNTER — Other Ambulatory Visit: Payer: Self-pay

## 2017-01-25 ENCOUNTER — Telehealth: Payer: Self-pay | Admitting: Cardiology

## 2017-01-25 ENCOUNTER — Other Ambulatory Visit (INDEPENDENT_AMBULATORY_CARE_PROVIDER_SITE_OTHER): Payer: Medicare Other

## 2017-01-25 ENCOUNTER — Encounter: Payer: Medicare Other | Admitting: Psychology

## 2017-01-25 DIAGNOSIS — Z08 Encounter for follow-up examination after completed treatment for malignant neoplasm: Secondary | ICD-10-CM

## 2017-01-25 DIAGNOSIS — R12 Heartburn: Secondary | ICD-10-CM | POA: Diagnosis not present

## 2017-01-25 DIAGNOSIS — E785 Hyperlipidemia, unspecified: Secondary | ICD-10-CM | POA: Diagnosis not present

## 2017-01-25 DIAGNOSIS — K219 Gastro-esophageal reflux disease without esophagitis: Secondary | ICD-10-CM

## 2017-01-25 DIAGNOSIS — E538 Deficiency of other specified B group vitamins: Secondary | ICD-10-CM

## 2017-01-25 DIAGNOSIS — Z85038 Personal history of other malignant neoplasm of large intestine: Secondary | ICD-10-CM

## 2017-01-25 LAB — BASIC METABOLIC PANEL
BUN: 19 mg/dL (ref 6–23)
CALCIUM: 10.3 mg/dL (ref 8.4–10.5)
CO2: 25 meq/L (ref 19–32)
CREATININE: 1.07 mg/dL (ref 0.40–1.20)
Chloride: 99 mEq/L (ref 96–112)
GFR: 52.84 mL/min — ABNORMAL LOW (ref >60.00)
GLUCOSE: 101 mg/dL — AB (ref 70–99)
Potassium: 4.6 mEq/L (ref 3.5–5.1)
Sodium: 130 mEq/L — ABNORMAL LOW (ref 135–145)

## 2017-01-25 NOTE — Telephone Encounter (Signed)
New Message:    Dr Inocente Salles wants to know if Dr Aundra Dubin will remove pt's porta cath? He would like to have this removes asap please.

## 2017-01-25 NOTE — Telephone Encounter (Signed)
I cannot remove her portacath.  Typically it has to be removed by whoever put it in, either surgery or interventional radiology.  Could see if our interventional radiology department would be willing to remove it.

## 2017-01-25 NOTE — Telephone Encounter (Signed)
Meghan Oliver aware that Dr Aundra Dubin cannot remove porta cath, given other comments from Dr Aundra Dubin.

## 2017-01-25 NOTE — Telephone Encounter (Signed)
Spoke with Dr Lyla Son and with Kyra Manges, care giver for the patient. Advised of the findings with [facility]. Appointment for follow up made for 02/23/17 at 2:45 pm.

## 2017-01-25 NOTE — Telephone Encounter (Signed)
PA done on Esomeprazole 40 mg BID. Not Omeprazole.

## 2017-01-25 NOTE — Telephone Encounter (Signed)
Meghan Oliver, caregiver, states pt has porta cath placed at [facility] for psychiatry treatments. Meghan Oliver states pt is doing better and  is not planning to return to [facility] for treatments. Meghan Oliver states pt's husband, Dr Lyla Son, is asking if Dr Aundra Dubin will remove porta cath. Meghan Oliver advised I will forward to Dr Aundra Dubin for review.

## 2017-01-25 NOTE — Telephone Encounter (Signed)
Patient is taking Omeprazole. Confirmed with the pharmacy she has pick up Omeprazole most recently and Esomeprazole was put on file pending prior approval by her insurance. PA was done using the [facility] insurance #[id number].  Pharmacy is unable to process. They will look into it and get back to me. ([facility] [phone])

## 2017-01-25 NOTE — Telephone Encounter (Signed)
[facility] found the issue to be with [employer]. The patient or her designated person will have to call [employer] and advise they want Part D plan only. [facility] will call the patient's home and advise.

## 2017-01-26 LAB — LITHIUM LEVEL: Lithium Lvl: 0.1 mmol/L — ABNORMAL LOW (ref 0.6–1.2)

## 2017-01-28 ENCOUNTER — Telehealth: Payer: Self-pay

## 2017-01-28 NOTE — Telephone Encounter (Signed)
Dr Silverio Decamp wants to see the patient sooner than she is scheduled.  Left a message requesting a call back. I have an appointment for her 02/01/17 at 2:30 pm. Requesting confirmation if this will work for Meghan Oliver's schedule.

## 2017-02-01 ENCOUNTER — Ambulatory Visit: Payer: Medicare Other | Admitting: Gastroenterology

## 2017-02-02 ENCOUNTER — Other Ambulatory Visit: Payer: Medicare Other

## 2017-02-02 DIAGNOSIS — E785 Hyperlipidemia, unspecified: Secondary | ICD-10-CM

## 2017-02-02 LAB — NORTRIPTYLINE LEVEL

## 2017-02-05 LAB — NORTRIPTYLINE LEVEL: NORTRIPTYLINE LVL: 162 ug/L — AB (ref 50–150)

## 2017-02-07 DIAGNOSIS — M545 Low back pain: Secondary | ICD-10-CM | POA: Diagnosis not present

## 2017-02-08 ENCOUNTER — Encounter: Payer: Medicare Other | Admitting: Women's Health

## 2017-02-09 DIAGNOSIS — M545 Low back pain: Secondary | ICD-10-CM | POA: Diagnosis not present

## 2017-02-09 DIAGNOSIS — M4316 Spondylolisthesis, lumbar region: Secondary | ICD-10-CM | POA: Diagnosis not present

## 2017-02-10 NOTE — Telephone Encounter (Addendum)
Patient has not responded to letter mailed 11/2016 , she need complete exam with N Young and discuss Prolia not covered by insurance. DX M85.89   Results for Meghan Oliver, Meghan Oliver (MRN [id number]) as of 02/10/2017 13:59  Ref. Range 01/25/2017 14:24  Calcium Latest Ref Range: 8.4 - 10.5 mg/dL 10.3   I just found complete scheduled 02/16/17 with Elon Alas. NO information regarding Prolia injection. I asked Izora Gala if can be given on complete day.

## 2017-02-14 ENCOUNTER — Telehealth: Payer: Self-pay | Admitting: Gastroenterology

## 2017-02-14 MED ORDER — OMEPRAZOLE 40 MG PO CPDR: 40.0000 mg | DELAYED_RELEASE_CAPSULE | Freq: Two times a day (BID) | ORAL | 4 refills | Status: DC

## 2017-02-14 NOTE — Telephone Encounter (Signed)
Med sent.

## 2017-02-16 ENCOUNTER — Ambulatory Visit (INDEPENDENT_AMBULATORY_CARE_PROVIDER_SITE_OTHER): Payer: Medicare Other | Admitting: Women's Health

## 2017-02-16 ENCOUNTER — Encounter: Payer: Self-pay | Admitting: Gynecology

## 2017-02-16 ENCOUNTER — Encounter: Payer: Self-pay | Admitting: Women's Health

## 2017-02-16 ENCOUNTER — Telehealth: Payer: Self-pay | Admitting: Gynecology

## 2017-02-16 VITALS — BP 130/82 | Ht 59.0 in | Wt 108.0 lb

## 2017-02-16 DIAGNOSIS — Z01419 Encounter for gynecological examination (general) (routine) without abnormal findings: Secondary | ICD-10-CM | POA: Diagnosis not present

## 2017-02-16 DIAGNOSIS — M8589 Other specified disorders of bone density and structure, multiple sites: Secondary | ICD-10-CM

## 2017-02-16 NOTE — Patient Instructions (Addendum)
Health Maintenance for Postmenopausal Women Menopause is a normal process in which your reproductive ability comes to an end. This process happens gradually over a span of months to years, usually between the ages of 33 and 38. Menopause is complete when you have missed 12 consecutive menstrual periods. It is important to talk with your health care provider about some of the most common conditions that affect postmenopausal women, such as heart disease, cancer, and bone loss (osteoporosis). Adopting a healthy lifestyle and getting preventive care can help to promote your health and wellness. Those actions can also lower your chances of developing some of these common conditions. What should I know about menopause? During menopause, you may experience a number of symptoms, such as:  Moderate-to-severe hot flashes.  Night sweats.  Decrease in sex drive.  Mood swings.  Headaches.  Tiredness.  Irritability.  Memory problems.  Insomnia. Choosing to treat or not to treat menopausal changes is an individual decision that you make with your health care provider. What should I know about hormone replacement therapy and supplements? Hormone therapy products are effective for treating symptoms that are associated with menopause, such as hot flashes and night sweats. Hormone replacement carries certain risks, especially as you become older. If you are thinking about using estrogen or estrogen with progestin treatments, discuss the benefits and risks with your health care provider. What should I know about heart disease and stroke? Heart disease, heart attack, and stroke become more likely as you age. This may be due, in part, to the hormonal changes that your body experiences during menopause. These can affect how your body processes dietary fats, triglycerides, and cholesterol. Heart attack and stroke are both medical emergencies. There are many things that you can do to help prevent heart disease  and stroke:  Have your blood pressure checked at least every 1-2 years. High blood pressure causes heart disease and increases the risk of stroke.  If you are 48-61 years old, ask your health care provider if you should take aspirin to prevent a heart attack or a stroke.  Do not use any tobacco products, including cigarettes, chewing tobacco, or electronic cigarettes. If you need help quitting, ask your health care provider.  It is important to eat a healthy diet and maintain a healthy weight.  Be sure to include plenty of vegetables, fruits, low-fat dairy products, and lean protein.  Avoid eating foods that are high in solid fats, added sugars, or salt (sodium).  Get regular exercise. This is one of the most important things that you can do for your health.  Try to exercise for at least 150 minutes each week. The type of exercise that you do should increase your heart rate and make you sweat. This is known as moderate-intensity exercise.  Try to do strengthening exercises at least twice each week. Do these in addition to the moderate-intensity exercise.  Know your numbers.Ask your health care provider to check your cholesterol and your blood glucose. Continue to have your blood tested as directed by your health care provider. What should I know about cancer screening? There are several types of cancer. Take the following steps to reduce your risk and to catch any cancer development as early as possible. Breast Cancer  Practice breast self-awareness.  This means understanding how your breasts normally appear and feel.  It also means doing regular breast self-exams. Let your health care provider know about any changes, no matter how small.  If you are 40 or older,  have a clinician do a breast exam (clinical breast exam or CBE) every year. Depending on your age, family history, and medical history, it may be recommended that you also have a yearly breast X-ray (mammogram).  If you  have a family history of breast cancer, talk with your health care provider about genetic screening.  If you are at high risk for breast cancer, talk with your health care provider about having an MRI and a mammogram every year.  Breast cancer (BRCA) gene test is recommended for women who have family members with BRCA-related cancers. Results of the assessment will determine the need for genetic counseling and BRCA1 and for BRCA2 testing. BRCA-related cancers include these types:  Breast. This occurs in males or females.  Ovarian.  Tubal. This may also be called fallopian tube cancer.  Cancer of the abdominal or pelvic lining (peritoneal cancer).  Prostate.  Pancreatic. Cervical, Uterine, and Ovarian Cancer  Your health care provider may recommend that you be screened regularly for cancer of the pelvic organs. These include your ovaries, uterus, and vagina. This screening involves a pelvic exam, which includes checking for microscopic changes to the surface of your cervix (Pap test).  For women ages 21-65, health care providers may recommend a pelvic exam and a Pap test every three years. For women ages 23-65, they may recommend the Pap test and pelvic exam, combined with testing for human papilloma virus (HPV), every five years. Some types of HPV increase your risk of cervical cancer. Testing for HPV may also be done on women of any age who have unclear Pap test results.  Other health care providers may not recommend any screening for nonpregnant women who are considered low risk for pelvic cancer and have no symptoms. Ask your health care provider if a screening pelvic exam is right for you.  If you have had past treatment for cervical cancer or a condition that could lead to cancer, you need Pap tests and screening for cancer for at least 20 years after your treatment. If Pap tests have been discontinued for you, your risk factors (such as having a new sexual partner) need to be reassessed  to determine if you should start having screenings again. Some women have medical problems that increase the chance of getting cervical cancer. In these cases, your health care provider may recommend that you have screening and Pap tests more often.  If you have a family history of uterine cancer or ovarian cancer, talk with your health care provider about genetic screening.  If you have vaginal bleeding after reaching menopause, tell your health care provider.  There are currently no reliable tests available to screen for ovarian cancer. Lung Cancer  Lung cancer screening is recommended for adults 99-83 years old who are at high risk for lung cancer because of a history of smoking. A yearly low-dose CT scan of the lungs is recommended if you:  Currently smoke.  Have a history of at least 30 pack-years of smoking and you currently smoke or have quit within the past 15 years. A pack-year is smoking an average of one pack of cigarettes per day for one year. Yearly screening should:  Continue until it has been 15 years since you quit.  Stop if you develop a health problem that would prevent you from having lung cancer treatment. Colorectal Cancer  This type of cancer can be detected and can often be prevented.  Routine colorectal cancer screening usually begins at age 72 and continues  through age 75.  If you have risk factors for colon cancer, your health care provider may recommend that you be screened at an earlier age.  If you have a family history of colorectal cancer, talk with your health care provider about genetic screening.  Your health care provider may also recommend using home test kits to check for hidden blood in your stool.  A small camera at the end of a tube can be used to examine your colon directly (sigmoidoscopy or colonoscopy). This is done to check for the earliest forms of colorectal cancer.  Direct examination of the colon should be repeated every 5-10 years until  age 75. However, if early forms of precancerous polyps or small growths are found or if you have a family history or genetic risk for colorectal cancer, you may need to be screened more often. Skin Cancer  Check your skin from head to toe regularly.  Monitor any moles. Be sure to tell your health care provider:  About any new moles or changes in moles, especially if there is a change in a mole's shape or color.  If you have a mole that is larger than the size of a pencil eraser.  If any of your family members has a history of skin cancer, especially at a young age, talk with your health care provider about genetic screening.  Always use sunscreen. Apply sunscreen liberally and repeatedly throughout the day.  Whenever you are outside, protect yourself by wearing long sleeves, pants, a wide-brimmed hat, and sunglasses. What should I know about osteoporosis? Osteoporosis is a condition in which bone destruction happens more quickly than new bone creation. After menopause, you may be at an increased risk for osteoporosis. To help prevent osteoporosis or the bone fractures that can happen because of osteoporosis, the following is recommended:  If you are 19-50 years old, get at least 1,000 mg of calcium and at least 600 mg of vitamin D per day.  If you are older than age 50 but younger than age 70, get at least 1,200 mg of calcium and at least 600 mg of vitamin D per day.  If you are older than age 70, get at least 1,200 mg of calcium and at least 800 mg of vitamin D per day. Smoking and excessive alcohol intake increase the risk of osteoporosis. Eat foods that are rich in calcium and vitamin D, and do weight-bearing exercises several times each week as directed by your health care provider. What should I know about how menopause affects my mental health? Depression may occur at any age, but it is more common as you become older. Common symptoms of depression include:  Low or sad  mood.  Changes in sleep patterns.  Changes in appetite or eating patterns.  Feeling an overall lack of motivation or enjoyment of activities that you previously enjoyed.  Frequent crying spells. Talk with your health care provider if you think that you are experiencing depression. What should I know about immunizations? It is important that you get and maintain your immunizations. These include:  Tetanus, diphtheria, and pertussis (Tdap) booster vaccine.  Influenza every year before the flu season begins.  Pneumonia vaccine.  Shingles vaccine. Your health care provider may also recommend other immunizations. This information is not intended to replace advice given to you by your health care provider. Make sure you discuss any questions you have with your health care provider. Document Released: 11/12/2005 Document Revised: 04/09/2016 Document Reviewed: 06/24/2015 Elsevier Interactive Patient   Education  2017 [employer]. Osteoporosis Osteoporosis happens when your bones become thinner and weaker. Weak bones can break (fracture) more easily when you slip or fall. Bones most at risk of breaking are in the hip, wrist, and spine. Follow these instructions at home:  Get enough calcium and vitamin D. These nutrients are good for your bones.  Exercise as told by your doctor.  Do not use any tobacco products. This includes cigarettes, chewing tobacco, and electronic cigarettes. If you need help quitting, ask your doctor.  Limit the amount of alcohol you drink.  Take medicines only as told by your doctor.  Keep all follow-up visits as told by your doctor. This is important.  Take care at home to prevent falls. Some ways to do this are:  Keep rooms well lit and tidy.  Put safety rails on your stairs.  Put a rubber mat in the bathroom and other places that are often wet or slippery. Get help right away if:  You fall.  You hurt yourself. This information is not intended to  replace advice given to you by your health care provider. Make sure you discuss any questions you have with your health care provider. Document Released: 12/13/2011 Document Revised: 02/26/2016 Document Reviewed: 02/28/2014 Elsevier Interactive Patient Education  2017 Else[employer]. Zoledronic Acid injection (Paget's Disease, Osteoporosis) What is this medicine? ZOLEDRONIC ACID (ZOE le dron ik AS id) lowers the amount of calcium loss from bone. It is used to treat Paget's disease and osteoporosis in women. This medicine may be used for other purposes; ask your health care provider or pharmacist if you have questions. COMMON BRAND NAME(S): Reclast, Zometa What should I tell my health care provider before I take this medicine? They need to know if you have any of these conditions: -aspirin-sensitive asthma -cancer, especially if you are receiving medicines used to treat cancer -dental disease or wear dentures -infection -kidney disease -low levels of calcium in the blood -past surgery on the parathyroid gland or intestines -receiving corticosteroids like dexamethasone or prednisone -an unusual or allergic reaction to zoledronic acid, other medicines, foods, dyes, or preservatives -pregnant or trying to get pregnant -breast-feeding How should I use this medicine? This medicine is for infusion into a vein. It is given by a health care professional in a hospital or clinic setting. Talk to your pediatrician regarding the use of this medicine in children. This medicine is not approved for use in children. Overdosage: If you think you have taken too much of this medicine contact a poison control center or emergency room at once. NOTE: This medicine is only for you. Do not share this medicine with others. What if I miss a dose? It is important not to miss your dose. Call your doctor or health care professional if you are unable to keep an appointment. What may interact with this  medicine? -certain antibiotics given by injection -NSAIDs, medicines for pain and inflammation, like ibuprofen or naproxen -some diuretics like bumetanide, furosemide -teriparatide This list may not describe all possible interactions. Give your health care provider a list of all the medicines, herbs, non-prescription drugs, or dietary supplements you use. Also tell them if you smoke, drink alcohol, or use illegal drugs. Some items may interact with your medicine. What should I watch for while using this medicine? Visit your doctor or health care professional for regular checkups. It may be some time before you see the benefit from this medicine. Do not stop taking your medicine unless your doctor  tells you to. Your doctor may order blood tests or other tests to see how you are doing. Women should inform their doctor if they wish to become pregnant or think they might be pregnant. There is a potential for serious side effects to an unborn child. Talk to your health care professional or pharmacist for more information. You should make sure that you get enough calcium and vitamin D while you are taking this medicine. Discuss the foods you eat and the vitamins you take with your health care professional. Some people who take this medicine have severe bone, joint, and/or muscle pain. This medicine may also increase your risk for jaw problems or a broken thigh bone. Tell your doctor right away if you have severe pain in your jaw, bones, joints, or muscles. Tell your doctor if you have any pain that does not go away or that gets worse. Tell your dentist and dental surgeon that you are taking this medicine. You should not have major dental surgery while on this medicine. See your dentist to have a dental exam and fix any dental problems before starting this medicine. Take good care of your teeth while on this medicine. Make sure you see your dentist for regular follow-up appointments. What side effects may I  notice from receiving this medicine? Side effects that you should report to your doctor or health care professional as soon as possible: -allergic reactions like skin rash, itching or hives, swelling of the face, lips, or tongue -anxiety, confusion, or depression -breathing problems -changes in vision -eye pain -feeling faint or lightheaded, falls -jaw pain, especially after dental work -mouth sores -muscle cramps, stiffness, or weakness -redness, blistering, peeling or loosening of the skin, including inside the mouth -trouble passing urine or change in the amount of urine Side effects that usually do not require medical attention (report to your doctor or health care professional if they continue or are bothersome): -bone, joint, or muscle pain -constipation -diarrhea -fever -hair loss -irritation at site where injected -loss of appetite -nausea, vomiting -stomach upset -trouble sleeping -trouble swallowing -weak or tired This list may not describe all possible side effects. Call your doctor for medical advice about side effects. You may report side effects to FDA at 1-800-FDA-1088. Where should I keep my medicine? This drug is given in a hospital or clinic and will not be stored at home. NOTE: This sheet is a summary. It may not cover all possible information. If you have questions about this medicine, talk to your doctor, pharmacist, or health care provider.  2018 Elsevier/Gold Standard (2014-02-16 14:19:57)

## 2017-02-16 NOTE — Telephone Encounter (Signed)
prolia is NOT covered by Medicare per Medicare guidelines for osteopenia. Notes regarding Reclast sent to [employer] Pt here today [employer] benefits state Prolia is covered. I instructed [name] A NOT TO GIVE Prolia per Sharrie Rothman instructions. Reclast is covered for osteopenia.

## 2017-02-16 NOTE — Progress Notes (Signed)
Meghan Oliver [birth year] [id number]    History:    Presents for breast and pelvic  exam.  Normal pap and mammogram hx. prolia first 07/2016.  04/2015 -2.1FRAX 14/24.1%. Will change to Reclast due to insurance not covering Prolia. Vaccines current. Severe depression but doing better. 08/2016 benign colon polyp. Hypertension and coronary artery disease primary care managing. Not sexually active.  Past medical history, past surgical history, family history and social history were all reviewed and documented in the EPIC chart. 2 children both doing well, lives 4 months of the year in [place].  ROS:  A ROS was performed and pertinent positives and negatives are included.  Exam:  Vitals:   02/16/17 1412  Weight: 108 lb (49 kg)  Height: 4\' 11"  (1.499 m)   Body mass index is 21.81 kg/m.   General appearance:  Normal Thyroid:  Symmetrical, normal in size, without palpable masses or nodularity. Respiratory  Auscultation:  Clear without wheezing or rhonchi Cardiovascular  Auscultation:  Regular rate, without rubs, murmurs or gallops  Edema/varicosities:  Not grossly evident Abdominal  Soft,nontender, without masses, guarding or rebound.  Liver/spleen:  No organomegaly noted  Hernia:  None appreciated  Skin  Inspection:  Grossly normal   Breasts: Examined lying and sitting. Bilateral implants    Right: Without masses, retractions, discharge or axillary adenopathy.     Left: Without masses, retractions, discharge or axillary adenopathy. Gentitourinary   Inguinal/mons:  Normal without inguinal adenopathy  External genitalia:  Normal  BUS/Urethra/Skene's glands:  Normal  Vagina:  Atrophic  Cervix:  Normal  Uterus:   normal in size, shape and contour.  Midline and mobile  Adnexa/parametria:     Rt: Without masses or tenderness.   Lt: Without masses or tenderness.  Anus and perineum: Normal  Digital rectal exam: Normal sphincter tone without palpated masses or  tenderness  Assessment/Plan:  76 y.o. MWF G2 P2 for breast and pelvic exam.  Osteopenia with elevated FRAX Depression-counselors and psychiatrist managing Memory loss Hypertension, coronary artery disease-primary care and cardiologist managing  Plan: Osteopenia with elevated FRAX reviewed importance of weightbearing exercise, home safety, fall prevention reviewed. Reclast. Currently scheduled for dental work will call after dental work is completed to schedule Reclast. Reviewed labs are needed within 30 days of Reclast infusion will get later closer to infusion time. SBE's, continue annual screening mammogram overdue instructed to schedule. Repeat DEXA July 2018. Plan made with both Borghild and her husband per her request.  Huel Cote [facility], 2:22 PM 02/16/2017

## 2017-02-17 ENCOUNTER — Ambulatory Visit (INDEPENDENT_AMBULATORY_CARE_PROVIDER_SITE_OTHER): Payer: Medicare Other | Admitting: Internal Medicine

## 2017-02-17 ENCOUNTER — Encounter: Payer: Self-pay | Admitting: Internal Medicine

## 2017-02-17 VITALS — BP 122/76 | HR 63 | Temp 99.2°F | Ht 59.0 in | Wt 109.0 lb

## 2017-02-17 DIAGNOSIS — R0683 Snoring: Secondary | ICD-10-CM

## 2017-02-17 DIAGNOSIS — E784 Other hyperlipidemia: Secondary | ICD-10-CM

## 2017-02-17 DIAGNOSIS — R413 Other amnesia: Secondary | ICD-10-CM

## 2017-02-17 DIAGNOSIS — E039 Hypothyroidism, unspecified: Secondary | ICD-10-CM | POA: Diagnosis not present

## 2017-02-17 DIAGNOSIS — F310 Bipolar disorder, current episode hypomanic: Secondary | ICD-10-CM | POA: Diagnosis not present

## 2017-02-17 DIAGNOSIS — I10 Essential (primary) hypertension: Secondary | ICD-10-CM | POA: Diagnosis not present

## 2017-02-17 DIAGNOSIS — E7849 Other hyperlipidemia: Secondary | ICD-10-CM

## 2017-02-17 DIAGNOSIS — R251 Tremor, unspecified: Secondary | ICD-10-CM | POA: Diagnosis not present

## 2017-02-21 ENCOUNTER — Encounter: Payer: Medicare Other | Admitting: Psychology

## 2017-02-21 NOTE — Telephone Encounter (Signed)
prolia is NOT covered by Medicare per Medicare guidelines for osteopenia. Notes regarding Reclast sent to [employer] Pt here today [employer] benefits state Prolia is covered. I instructed [name] A NOT TO GIVE Prolia per Sharrie Rothman instructions. Reclast is covered for osteopenia.  This note was from  02/16/17

## 2017-02-21 NOTE — Telephone Encounter (Signed)
Pt has completed annual exam with N Young. Pt is scheduled for dental work prior to [employer] will be ordered in [employer] and we will wait for pt to call back after dental work for [employer].

## 2017-02-22 ENCOUNTER — Ambulatory Visit: Payer: Self-pay | Admitting: General Surgery

## 2017-02-22 DIAGNOSIS — Z95828 Presence of other vascular implants and grafts: Secondary | ICD-10-CM | POA: Diagnosis not present

## 2017-02-22 NOTE — H&P (Signed)
Meghan Oliver 02/22/2017 10:50 AM Location: [facility] Patient #: [id number] DOB: 1941-09-07 Married / Language: English / Race: White Female  History of Present Illness Meghan Hollingshead MD; 02/22/2017 11:22 AM) The patient is a 76 year old female.   Note:She is self-referred. She presents today for discussion of Port-A-Cath removal. She has major depressive disorder and had intense ECT treatments beginning in August. She needed good IV access for these. A Port-A-Cath was placed at [facility]. She no longer needs the IV access and comes in today to discuss Port-A-Cath removal. Her nurse is here with her.  Past Surgical History Meghan Oliver, [place]; 02/22/2017 10:50 AM) Appendectomy Breast Augmentation Bilateral. Breast Biopsy Left. Colon Polyp Removal - Colonoscopy Foot Surgery Bilateral. Hysterectomy (not due to cancer) - Partial  Diagnostic Studies History Meghan Oliver, [place]; 02/22/2017 10:50 AM) Colonoscopy within last year Mammogram 1-3 years ago Pap Smear 1-5 years ago  Allergies Meghan Oliver, [place]; 02/22/2017 10:54 AM) Propranolol HCl *BETA BLOCKERS* bradycardia Rexulti *ANTIPSYCHOTICS/ANTIMANIC AGENTS* Catatonic aphagia Allergies Reconciled  Medication History Meghan Oliver, RMA; 02/22/2017 11:01 AM) LORazepam (1MG Tablet, Oral) Active. AmLODIPine Besylate (10MG Tablet, Oral) Active. Celecoxib (200MG Capsule, Oral) Active. Irbesartan (150MG Tablet, Oral) Active. Levothyroxine Sodium (75MCG Tablet, Oral) Active. Lithium Carbonate ER (300MG Tablet ER, Oral) Active. Nortriptyline HCl (50MG Capsule, Oral) Active. Omeprazole (40MG Capsule DR, Oral) Active. Spironolactone (25MG Tablet, Oral) Active. [facility] Tablet ER 24HR, Oral) Active. Primidone (50MG Tablet, Oral) Active. Memantine HCl (10MG Tablet, Oral) Active. Paxil (20MG Tablet, Oral) Active. Multiple Vitamin (Oral) Active. Vitamin B Complex (Oral) Active. Vitamin B-12 (Oral)  Specific strength unknown - Active. Caltrate 600+D Plus Minerals (600-800MG-UNIT Tablet, Oral) Active. cocovia Active. Cerefolin NAC (Oral) Specific strength unknown - Active. Medications Reconciled  Social History Meghan Oliver, [place]; 02/22/2017 10:50 AM) Alcohol use Recently quit alcohol use. Caffeine use Coffee. No drug use Tobacco use Former smoker.  Family History Meghan Oliver, [place]; 02/22/2017 10:50 AM) Alcohol Abuse Mother. Depression Daughter. Heart Disease Brother, Father. Heart disease in female family member before age 8 Hypertension Brother, Father. Respiratory Condition Mother.  Pregnancy / Birth History Meghan Oliver, [place]; 02/22/2017 10:50 AM) Age at menarche 62 years. Age of menopause 84-50 Contraceptive History Intrauterine device. Gravida 2 Maternal age 84-25 Para 2 Regular periods  Other Problems Meghan Oliver, [place]; 02/22/2017 10:50 AM) Alcohol Abuse Anxiety Disorder Arthritis Back Pain Depression Gastroesophageal Reflux Disease High blood pressure Hypercholesterolemia Sleep Apnea Thyroid Disease     Review of Systems Meghan Oliver RMA; 02/22/2017 10:50 AM) General Not Present- Appetite Loss, Chills, Fatigue, Fever, Night Sweats, Weight Gain and Weight Loss. Skin Not Present- Change in Wart/Mole, Dryness, Hives, Jaundice, New Lesions, Non-Healing Wounds, Rash and Ulcer. HEENT Not Present- Earache, Hearing Loss, Hoarseness, Nose Bleed, Oral Ulcers, Ringing in the Ears, Seasonal Allergies, Sinus Pain, Sore Throat, Visual Disturbances, Wears glasses/contact lenses and Yellow Eyes. Respiratory Not Present- Bloody sputum, Chronic Cough, Difficulty Breathing, Snoring and Wheezing. Breast Not Present- Breast Mass, Breast Pain, Nipple Discharge and Skin Changes. Cardiovascular Not Present- Chest Pain, Difficulty Breathing Lying Down, Leg Cramps, Palpitations, Rapid Heart Rate, Shortness of Breath and Swelling of  Extremities. Gastrointestinal Not Present- Abdominal Pain, Bloating, Bloody Stool, Change in Bowel Habits, Chronic diarrhea, Constipation, Difficulty Swallowing, Excessive gas, Gets full quickly at meals, Hemorrhoids, Indigestion, Nausea, Rectal Pain and Vomiting. Female Genitourinary Not Present- Frequency, Nocturia, Painful Urination, Pelvic Pain and Urgency. Musculoskeletal Present- Back Pain and Joint Pain. Not Present- Joint Stiffness, Muscle Pain, Muscle Weakness and Swelling of  Extremities. Neurological Present- Decreased Memory. Not Present- Fainting, Headaches, Numbness, Seizures, Tingling, Tremor, Trouble walking and Weakness. Psychiatric Present- Anxiety, Bipolar and Depression. Not Present- Change in Sleep Pattern, Fearful and Frequent crying. Endocrine Not Present- Cold Intolerance, Excessive Hunger, Hair Changes, Heat Intolerance, Hot flashes and New Diabetes. Hematology Not Present- Blood Thinners, Easy Bruising, Excessive bleeding, Gland problems, HIV and Persistent Infections.  [employer] Oliver RMA; 02/22/2017 11:01 AM) 02/22/2017 11:01 AM Weight: 111.6 lb Height: 63in Body Surface Area: 1.51 m Body Mass Index: 19.77 kg/m  Temp.: 98.34F  Pulse: 63 (Regular)  P.OX: 93% (Room air) BP: 150/72 (Sitting, Left Arm, Standard)      Physical Exam Meghan Hollingshead MD; 02/22/2017 11:25 AM)  The physical exam findings are as follows: Note:GENERAL APPEARANCE: Thin female in NAD. Pleasant and cooperative.  EARS, NOSE, MOUTH THROAT: [place]/AT external ears: no lesions or deformities external nose: no lesions or deformities hearing: grossly normal lips: moist, no deformities EYES external: conjunctiva, lids, sclerae normal pupils: equal, round glasses: no  NECK: Supple, catheter noted tracking under the skin into the right neck, no trachea deviation  CHEST/RESP Small right upper chest wall scar with underlying palpable port.  NEUROLOGIC speech:  normal  PSYCHIATRIC alertness and orientation: normal mood/affect/behavior: normal judgement and insight: normal    Assessment & Plan Meghan Hollingshead MD; 02/22/2017 11:21 AM)  [name] IN PLACE (O75.643) Impression: She no longer needs the Port-A-Cath.  Plan: Port-A-Cath removal, local with MAC. We discussed the procedure and risks. The risks include but are not limited to bleeding, infection, wound healing problems, reaction to anesthesia. She and her nurse, soon understand this and agree with the plan. I asked them to stop her aspirin 5 days before the procedure.  Jackolyn Confer, M.D.

## 2017-02-23 ENCOUNTER — Ambulatory Visit (INDEPENDENT_AMBULATORY_CARE_PROVIDER_SITE_OTHER): Payer: Medicare Other | Admitting: Gastroenterology

## 2017-02-23 ENCOUNTER — Encounter: Payer: Self-pay | Admitting: Gastroenterology

## 2017-02-23 VITALS — BP 108/56 | HR 64 | Ht 58.5 in | Wt 111.4 lb

## 2017-02-23 DIAGNOSIS — K2100 Gastro-esophageal reflux disease with esophagitis, without bleeding: Secondary | ICD-10-CM

## 2017-02-23 DIAGNOSIS — K5900 Constipation, unspecified: Secondary | ICD-10-CM

## 2017-02-23 DIAGNOSIS — R159 Full incontinence of feces: Secondary | ICD-10-CM

## 2017-02-23 MED ORDER — RANITIDINE HCL 150 MG PO TABS: 150.0000 mg | ORAL_TABLET | Freq: Every day | ORAL | 3 refills | Status: DC

## 2017-02-23 MED ORDER — PANTOPRAZOLE SODIUM 40 MG PO TBEC: 40.0000 mg | DELAYED_RELEASE_TABLET | Freq: Every day | ORAL | 3 refills | Status: DC

## 2017-02-23 NOTE — Patient Instructions (Signed)
We have sent in pantoprazole 40 mg to your pharmacy to see if the insurance will cover that  We sent in Zantac to your pharmacy for you to use at bedtime  Use Gaviscon after meals as needed  Take Linzess 72 mcg daily, samples given     Kegel Exercises Kegel exercises help strengthen the muscles that support the rectum, vagina, small intestine, bladder, and uterus. Doing Kegel exercises can help:  Improve bladder and bowel control.  Improve sexual response.  Reduce problems and discomfort during pregnancy. Kegel exercises involve squeezing your pelvic floor muscles, which are the same muscles you squeeze when you try to stop the flow of urine. The exercises can be done while sitting, standing, or lying down, but it is best to vary your position. Phase 1 exercises 1. Squeeze your pelvic floor muscles tight. You should feel a tight lift in your rectal area. If you are a female, you should also feel a tightness in your vaginal area. Keep your stomach, buttocks, and legs relaxed. 2. Hold the muscles tight for up to 10 seconds. 3. Relax your muscles. Repeat this exercise 50 times a day or as many times as told by your health care provider. Continue to do this exercise for at least 4-6 weeks or for as long as told by your health care provider. This information is not intended to replace advice given to you by your health care provider. Make sure you discuss any questions you have with your health care provider. Document Released: 09/06/2012 Document Revised: 05/15/2016 Document Reviewed: 08/10/2015 Elsevier Interactive Patient Education  2017 [employer].

## 2017-02-23 NOTE — Progress Notes (Signed)
3..                 Meghan Oliver    [id number]    October 04, 1941  Primary Care Physician:Baxley, Cresenciano Lick, MD  Referring Physician: [name], MD [address] [place], [place] [address]  Chief complaint:  GERD, constipation  HPI: 76 year old female, Dr. Franki Oliver spouse is here for follow-up visit. She continues to have persistent breakthrough heartburn despite omeprazole daily and ranitidine at bedtime. She is taking Mylanta or Tums as needed. She is taking prednisone for arthritis for past 2 weeks . GERD symptoms were worse even prior to starting prednisone . Also having worsening constipation bowel movements every 4-5 days. Denies any dysphagia, odynophagia, nausea, vomiting or blood in stool.  EGD November 2017 revealed evidence of hiatal hernia and severe erosive esophagitis. Colonoscopy with removal of small tubular adenoma and sigmoid diverticulosis.  Outpatient Encounter Prescriptions as of 02/23/2017  Medication Sig  . amLODipine (NORVASC) 10 MG tablet TAKE 1 TABLET(10 MG) BY MOUTH DAILY  . aspirin 81 MG tablet Take 81 mg by mouth daily.    . Calcium Carbonate-Vitamin D (CALCIUM 600/VITAMIN D PO) Take 1 tablet by mouth 2 (two) times daily.   . celecoxib (CELEBREX) 200 MG capsule TAKE 1 CAPSULE BY MOUTH DAILY  . cetirizine (ZYRTEC) 10 MG tablet Take 10 mg by mouth 3 times/day as needed-between meals & bedtime.   . Cholecalciferol (VITAMIN D3) 2000 UNITS TABS Take 2 tablets by mouth daily.   Marland Kitchen docusate-casanthranol (PERICOLACE) 100-30 MG per capsule daily as needed.    Marland Kitchen FLUoxetine (PROZAC) 10 MG capsule Take 10 mg by mouth 3 (three) times daily.   . irbesartan (AVAPRO) 150 MG tablet Take 1 tablet (150 mg total) by mouth daily. Needs office visit for further refills  . L-Methylfolate-B12-B6-B2 (CEREFOLIN) 03-04-49-5 MG TABS Take 1 tablet by mouth at bedtime.  Marland Kitchen levothyroxine (SYNTHROID, LEVOTHROID) 75 MCG tablet Take 1 tablet (75 mcg total) by mouth daily.  Marland Kitchen LITHIUM PO  Take by mouth 3 (three) times daily.  Marland Kitchen LORazepam (ATIVAN) 0.5 MG tablet Take 0.5 mg by mouth 3 times/day as needed-between meals & bedtime for anxiety.   Marland Kitchen LORazepam (ATIVAN) 1 MG tablet Take 1 mg by mouth every 6 (six) hours as needed for anxiety.  . Multiple Vitamins-Minerals (MULTIVITAMIN WITH MINERALS) tablet Take 1 tablet by mouth daily.    Marland Kitchen omeprazole (PRILOSEC) 40 MG capsule Take 1 capsule (40 mg total) by mouth 2 (two) times daily.  Marland Kitchen PARoxetine (PAXIL) 20 MG tablet Take by mouth.  . potassium chloride SA (K-DUR,KLOR-CON) 20 MEQ tablet 2 po daily x 5 days then one po daily.  . predniSONE (DELTASONE) 10 MG tablet Take 10 mg by mouth at bedtime.  . primidone (MYSOLINE) 50 MG tablet 2 tabs AM and 1 tab QHS  . RA KRILL OIL 500 MG CAPS Take by mouth.  . ranitidine (ZANTAC) 150 MG tablet Take 150 mg by mouth at bedtime.  . rosuvastatin (CRESTOR) 20 MG tablet TAKE 1 TABLET(20 MG) BY MOUTH DAILY  . spironolactone (ALDACTONE) 25 MG tablet Take 0.5 tablets (12.5 mg total) by mouth daily.  . TOVIAZ 8 MG TB24 tablet TAKE 1 TABLET BY MOUTH DAILY  . vitamin B-12 (CYANOCOBALAMIN) 1000 MCG tablet Take 1,000 mcg by mouth at bedtime.  Marland Kitchen zolpidem (AMBIEN) 5 MG tablet Take 1 tablet (5 mg total) by mouth at bedtime as needed for sleep.   Facility-Administered Encounter Medications as of 02/23/2017  Medication  .  0.9 %  sodium chloride infusion  . 0.9 %  sodium chloride infusion    Allergies as of 02/23/2017 - Review Complete 02/23/2017  Allergen Reaction Noted  . Propranolol Other (See Comments) 08/06/2013  . Brexpiprazole Other (See Comments) 11/27/2015    Past Medical History:  Diagnosis Date  . Anxiety   . Bipolar disease, chronic ([place])   . Carotid bruit   . Carotid disease, bilateral ([place])    carotid dopplers 12/10 with 40-59% bilateral ICA stenosis  . Depression    s/p ECT  . GERD (gastroesophageal reflux disease)   . Heart murmur   . Hyperlipidemia   . Hypertension   . Low back  pain   . Migraines   . Osteoporosis     Past Surgical History:  Procedure Laterality Date  . ABDOMINAL SURGERY     Laparotomy Ovarian cystctomy  . APPENDECTOMY  1971  . AUGMENTATION MAMMAPLASTY    . ESOPHAGOGASTRODUODENOSCOPY  02-26-04  . TUBAL LIGATION      Family History  Problem Relation Age of Onset  . Heart attack Father 69       deceased  . Hypertension Father   . Heart disease Father   . Breast cancer Paternal Grandmother        Age unknown  . Breast cancer Paternal Aunt        Age 81's  . Colon cancer Neg Hx     Social History   Social History  . Marital status: Married    Spouse name: Dr. Lyla Oliver  . Number of children: 2  . Years of education: N/A   Occupational History  . housewife Unemployed   Social History Main Topics  . Smoking status: Former Smoker    Packs/day: 2.00    Years: 15.00    Types: Cigarettes    Quit date: 01/13/1971  . Smokeless tobacco: Never Used  . Alcohol use 0.0 oz/week     Comment: wine, three times per week  . Drug use: No  . Sexual activity: No     Comment: intercourse age 30, sexual partners less than 5   Other Topics Concern  . Not on file   Social History Narrative  . No narrative on file      Review of systems: Review of Systems  Constitutional: Negative for fever and chills.  HENT: Negative.   Eyes: Negative for blurred vision.  Respiratory: Negative for cough, shortness of breath and wheezing.   Cardiovascular: Negative for chest pain and palpitations.  Gastrointestinal: as per HPI Genitourinary: Negative for dysuria, urgency, frequency and hematuria.  Musculoskeletal: Positive for myalgias, back pain and joint pain.  Skin: Negative for itching and rash.  Neurological: Negative for dizziness, tremors, focal weakness, seizures and loss of consciousness.  Endo/Heme/Allergies: Negative for seasonal allergies.  Psychiatric/Behavioral: Negative for  suicidal ideas and hallucinations.  All other systems  reviewed and are negative.   Physical Exam: Vitals:   02/23/17 1529  BP: (!) 108/56  Pulse: 64   Body mass index is 22.89 kg/m. Gen:      No acute distress HEENT:  EOMI, sclera anicteric Neck:     No masses; no thyromegaly Lungs:    Clear to auscultation bilaterally; normal respiratory effort CV:         Regular rate and rhythm; no murmurs Abd:      + bowel sounds; soft, non-tender; no palpable masses, no distension Ext:    No edema; adequate peripheral perfusion Skin:  Warm and dry; no rash Neuro: alert and oriented x 3 Psych: normal mood and affect  Data Reviewed:  Reviewed labs, radiology imaging, old records and pertinent past GI work up   Assessment and Plan/Recommendations:  76 year old female with history of GERD, hiatal hernia, erosive esophagitis and constipation  GERD: Will switch to pantoprazole 40 mg daily, 30 minutes before breakfast Zantac 150 mg at bedtime Gaviscon after meals 3 times a day as needed Antireflux measures Continues to have persistent heartburn and reflux symptoms will consider EGD for further evaluation  Constipation: Start linzess 72 g daily Increase dietary fluid and fiber intake  History of fecal urgency and incontinence: Kegel exercises If no improvement will consider referral to [facility] for pelvic floor physical therapy  Return in 1 month or sooner if needed  25 minutes was spent face-to-face with the patient. Greater than 50% of the time used for counseling as well as treatment plan and follow-up of GERD and constipation. She had multiple questions which were answered to her satisfaction  K. Denzil Magnuson , MD [phone] Mon-Fri 8a-5p [phone] after 5p, weekends, holidays  CC: [name], MD

## 2017-02-25 DIAGNOSIS — F3341 Major depressive disorder, recurrent, in partial remission: Secondary | ICD-10-CM | POA: Diagnosis not present

## 2017-03-01 ENCOUNTER — Other Ambulatory Visit: Payer: Self-pay | Admitting: Internal Medicine

## 2017-03-01 NOTE — Progress Notes (Signed)
   Subjective:    Patient ID: Meghan Oliver, female    DOB: August 22, 1941, 76 y.o.   MRN: [id number]  HPI Patient is now 76 years old and has returned from a winter in [place]. She had a good time in [facility] ECT is helped her remarkably. However she has some short-term memory loss. She is accompanied by caretakers today, [name] Card and [name] Masters.  She is concerned about a "lump "under her tongue. It looks to be a cystic type lesion that is benign and is small.  Also, husband has told her that she snores and possibly has sleep apnea. Needs referral to pulmonary for sleep apnea.  Dr. Lytle Michaels going to pull her port in the near future. She needs a TD vaccine. Prescription given for Shingrix vaccine.  Dr. Carles Collet has her on primidone, she takes Namenda 10 mg twice daily per Dr. Luiz Blare she takes lithium per [name].  Cardiologist has her on amlodipine, spironolactone. She was on chlorthalidone but that is on hold. Spironolactone  was started in its place. She is also on Irbesartan instead of losartan.  She takes when necessary lorazepam and Ambien.  Recently had issues with back pain and was placed on tramadol twice daily per Dr. Noemi Chapel.  She takes Crestor Synthroid enteric coated aspirin, Cerefolin. Takes lorazepam 1 mg every morning. She also takes Toviaz per GYN. Has Ambien on a when necessary basis for sleep. Takes omeprazole.    Review of Systems see above     Objective:   Physical Exam Small cystic lesion under left tongue area. Chest clear. Cardiac exam regular rate and rhythm. Extremities without edema. She is pleasant and alert and conversation appropriate.       Assessment & Plan:  Cystic lesion under tongue-patient may want to see oral surgeon but I consider this to be a benign lesion  Referral for possible sleep apnea  Plan: Return as needed and will keep in close contact with her caretakers and husband.

## 2017-03-01 NOTE — Patient Instructions (Signed)
It was a pleasure to see you today. Have oral surgeon look at lesion under tongue. Referral made to pulmonary for sleep evaluation.

## 2017-03-01 NOTE — Telephone Encounter (Signed)
Reclast. Currently scheduled for dental work will call after dental work is completed to schedule Reclast. Reviewed labs are needed within 30 days of Reclast infusion will get later closer to infusion time noted from [employer]

## 2017-03-04 ENCOUNTER — Other Ambulatory Visit: Payer: Medicare Other | Admitting: *Deleted

## 2017-03-04 DIAGNOSIS — F310 Bipolar disorder, current episode hypomanic: Secondary | ICD-10-CM | POA: Diagnosis not present

## 2017-03-04 NOTE — Addendum Note (Signed)
Addended by: Eulis Foster on: 03/04/2017 03:53 PM   Modules accepted: Orders

## 2017-03-04 NOTE — Addendum Note (Signed)
Addended by: Eulis Foster on: 03/04/2017 03:50 PM   Modules accepted: Orders

## 2017-03-05 LAB — LITHIUM LEVEL: Lithium Lvl: <0.1 mmol/L — ABNORMAL LOW (ref 0.6–1.2)

## 2017-03-08 ENCOUNTER — Encounter (HOSPITAL_BASED_OUTPATIENT_CLINIC_OR_DEPARTMENT_OTHER): Payer: Self-pay | Admitting: *Deleted

## 2017-03-09 ENCOUNTER — Encounter (HOSPITAL_BASED_OUTPATIENT_CLINIC_OR_DEPARTMENT_OTHER): Payer: Self-pay | Admitting: *Deleted

## 2017-03-09 DIAGNOSIS — G4730 Sleep apnea, unspecified: Secondary | ICD-10-CM | POA: Diagnosis not present

## 2017-03-09 DIAGNOSIS — M1990 Unspecified osteoarthritis, unspecified site: Secondary | ICD-10-CM | POA: Diagnosis not present

## 2017-03-09 DIAGNOSIS — E079 Disorder of thyroid, unspecified: Secondary | ICD-10-CM | POA: Diagnosis not present

## 2017-03-09 DIAGNOSIS — Z452 Encounter for adjustment and management of vascular access device: Secondary | ICD-10-CM | POA: Diagnosis not present

## 2017-03-09 DIAGNOSIS — Z7902 Long term (current) use of antithrombotics/antiplatelets: Secondary | ICD-10-CM | POA: Diagnosis not present

## 2017-03-09 DIAGNOSIS — Z79899 Other long term (current) drug therapy: Secondary | ICD-10-CM | POA: Diagnosis not present

## 2017-03-09 DIAGNOSIS — E7800 Pure hypercholesterolemia, unspecified: Secondary | ICD-10-CM | POA: Diagnosis not present

## 2017-03-09 DIAGNOSIS — K219 Gastro-esophageal reflux disease without esophagitis: Secondary | ICD-10-CM | POA: Diagnosis not present

## 2017-03-09 DIAGNOSIS — F329 Major depressive disorder, single episode, unspecified: Secondary | ICD-10-CM | POA: Diagnosis not present

## 2017-03-09 DIAGNOSIS — I10 Essential (primary) hypertension: Secondary | ICD-10-CM | POA: Diagnosis not present

## 2017-03-09 DIAGNOSIS — Z87891 Personal history of nicotine dependence: Secondary | ICD-10-CM | POA: Diagnosis not present

## 2017-03-09 DIAGNOSIS — F419 Anxiety disorder, unspecified: Secondary | ICD-10-CM | POA: Diagnosis not present

## 2017-03-09 LAB — CBC WITH DIFFERENTIAL/PLATELET
BASOS PCT: 1 %
Basophils Absolute: 0 10*3/uL (ref 0.0–0.1)
EOS ABS: 0.1 10*3/uL (ref 0.0–0.7)
Eosinophils Relative: 2 %
HCT: 33.4 % — ABNORMAL LOW (ref 36.0–46.0)
Hemoglobin: 11.2 g/dL — ABNORMAL LOW (ref 12.0–15.0)
Lymphocytes Relative: 35 %
Lymphs Abs: 2.1 10*3/uL (ref 0.7–4.0)
MCH: 30.6 pg (ref 26.0–34.0)
MCHC: 33.5 g/dL (ref 30.0–36.0)
MCV: 91.3 fL (ref 78.0–100.0)
MONO ABS: 0.5 10*3/uL (ref 0.1–1.0)
Monocytes Relative: 8 %
Neutro Abs: 3.4 10*3/uL (ref 1.7–7.7)
Neutrophils Relative %: 54 %
Platelets: 222 10*3/uL (ref 150–400)
RBC: 3.66 MIL/uL — ABNORMAL LOW (ref 3.87–5.11)
RDW: 12.4 % (ref 11.5–15.5)
WBC: 6.2 10*3/uL (ref 4.0–10.5)

## 2017-03-09 NOTE — Progress Notes (Signed)
Spoke with  [employer] RN- stated patient is poor historian.Will come today for pre op CBC w/diff.To [facility] at 0600-Istat on arrival-Ekg with chart.Npo after Mn-will take norvasc,ativan,protonix,zantac,irbesartin with small amt water .Hibiclens shower pm and am of procedure.

## 2017-03-11 ENCOUNTER — Encounter (HOSPITAL_BASED_OUTPATIENT_CLINIC_OR_DEPARTMENT_OTHER): Payer: Self-pay

## 2017-03-11 ENCOUNTER — Ambulatory Visit (HOSPITAL_BASED_OUTPATIENT_CLINIC_OR_DEPARTMENT_OTHER)
Admission: RE | Admit: 2017-03-11 | Discharge: 2017-03-11 | Disposition: A | Discharge status: Home / Self Care | Payer: Medicare Other | Source: Ambulatory Visit | Attending: General Surgery | Admitting: General Surgery

## 2017-03-11 ENCOUNTER — Ambulatory Visit (HOSPITAL_BASED_OUTPATIENT_CLINIC_OR_DEPARTMENT_OTHER): Payer: Medicare Other | Admitting: Anesthesiology

## 2017-03-11 ENCOUNTER — Encounter (HOSPITAL_BASED_OUTPATIENT_CLINIC_OR_DEPARTMENT_OTHER)
Admission: RE | Disposition: A | Discharge status: Home / Self Care | Payer: Self-pay | Source: Ambulatory Visit | Attending: General Surgery

## 2017-03-11 DIAGNOSIS — Z79899 Other long term (current) drug therapy: Secondary | ICD-10-CM | POA: Diagnosis not present

## 2017-03-11 DIAGNOSIS — I10 Essential (primary) hypertension: Secondary | ICD-10-CM | POA: Insufficient documentation

## 2017-03-11 DIAGNOSIS — F329 Major depressive disorder, single episode, unspecified: Secondary | ICD-10-CM | POA: Insufficient documentation

## 2017-03-11 DIAGNOSIS — Z452 Encounter for adjustment and management of vascular access device: Secondary | ICD-10-CM | POA: Diagnosis not present

## 2017-03-11 DIAGNOSIS — G4733 Obstructive sleep apnea (adult) (pediatric): Secondary | ICD-10-CM | POA: Diagnosis not present

## 2017-03-11 DIAGNOSIS — M1990 Unspecified osteoarthritis, unspecified site: Secondary | ICD-10-CM | POA: Insufficient documentation

## 2017-03-11 DIAGNOSIS — E079 Disorder of thyroid, unspecified: Secondary | ICD-10-CM | POA: Diagnosis not present

## 2017-03-11 DIAGNOSIS — Z7902 Long term (current) use of antithrombotics/antiplatelets: Secondary | ICD-10-CM | POA: Diagnosis not present

## 2017-03-11 DIAGNOSIS — Z87891 Personal history of nicotine dependence: Secondary | ICD-10-CM | POA: Diagnosis not present

## 2017-03-11 DIAGNOSIS — K219 Gastro-esophageal reflux disease without esophagitis: Secondary | ICD-10-CM | POA: Insufficient documentation

## 2017-03-11 DIAGNOSIS — E7800 Pure hypercholesterolemia, unspecified: Secondary | ICD-10-CM | POA: Insufficient documentation

## 2017-03-11 DIAGNOSIS — G4730 Sleep apnea, unspecified: Secondary | ICD-10-CM | POA: Insufficient documentation

## 2017-03-11 DIAGNOSIS — F419 Anxiety disorder, unspecified: Secondary | ICD-10-CM | POA: Insufficient documentation

## 2017-03-11 DIAGNOSIS — I2510 Atherosclerotic heart disease of native coronary artery without angina pectoris: Secondary | ICD-10-CM | POA: Diagnosis not present

## 2017-03-11 HISTORY — PX: PORT-A-CATH REMOVAL: SHX5289

## 2017-03-11 HISTORY — DX: Hypothyroidism, unspecified: E03.9

## 2017-03-11 HISTORY — DX: Other specified postprocedural states: Z98.890

## 2017-03-11 HISTORY — DX: Cardiac murmur, unspecified: R01.1

## 2017-03-11 HISTORY — DX: Generalized anxiety disorder: F41.1

## 2017-03-11 HISTORY — DX: Ulcer of esophagus without bleeding: K22.10

## 2017-03-11 HISTORY — DX: Personal history of colonic polyps: Z86.010

## 2017-03-11 HISTORY — DX: Major depressive disorder, single episode, unspecified: F32.9

## 2017-03-11 HISTORY — DX: Solitary pulmonary nodule: R91.1

## 2017-03-11 HISTORY — DX: Obstructive sleep apnea (adult) (pediatric): G47.33

## 2017-03-11 HISTORY — DX: Personal history of adenomatous and serrated colon polyps: Z86.0101

## 2017-03-11 HISTORY — DX: Bipolar II disorder: F31.81

## 2017-03-11 HISTORY — DX: Occlusion and stenosis of bilateral carotid arteries: I65.23

## 2017-03-11 HISTORY — DX: Chronic kidney disease, stage 2 (mild): N18.2

## 2017-03-11 LAB — POCT I-STAT 4, (NA,K, GLUC, HGB,HCT)
GLUCOSE: 80 mg/dL (ref 65–99)
HCT: 32 % — ABNORMAL LOW (ref 36.0–46.0)
Hemoglobin: 10.9 g/dL — ABNORMAL LOW (ref 12.0–15.0)
Potassium: 4.4 mmol/L (ref 3.5–5.1)
Sodium: 134 mmol/L — ABNORMAL LOW (ref 135–145)

## 2017-03-11 SURGERY — REMOVAL PORT-A-CATH
Anesthesia: Monitor Anesthesia Care | Site: Chest

## 2017-03-11 MED ORDER — MIDAZOLAM HCL 2 MG/2ML IJ SOLN
INTRAMUSCULAR | Status: AC
  Filled 2017-03-11: qty 2

## 2017-03-11 MED ORDER — OXYCODONE HCL 5 MG PO TABS
5.0000 mg | ORAL_TABLET | Freq: Once | ORAL | Status: DC | PRN
  Filled 2017-03-11: qty 1

## 2017-03-11 MED ORDER — ONDANSETRON HCL 4 MG/2ML IJ SOLN
4.0000 mg | Freq: Once | INTRAMUSCULAR | Status: DC | PRN
  Filled 2017-03-11: qty 2

## 2017-03-11 MED ORDER — PROPOFOL 500 MG/50ML IV EMUL
INTRAVENOUS | Status: DC | PRN
  Administered 2017-03-11: 200 ug/kg/min via INTRAVENOUS

## 2017-03-11 MED ORDER — ONDANSETRON HCL 4 MG/2ML IJ SOLN
INTRAMUSCULAR | Status: DC | PRN
  Administered 2017-03-11: 4 mg via INTRAVENOUS

## 2017-03-11 MED ORDER — FENTANYL CITRATE (PF) 100 MCG/2ML IJ SOLN
25.0000 ug | INTRAMUSCULAR | Status: DC | PRN
  Filled 2017-03-11: qty 1

## 2017-03-11 MED ORDER — CHLORHEXIDINE GLUCONATE CLOTH 2 % EX PADS
6.0000 | MEDICATED_PAD | Freq: Once | CUTANEOUS | Status: DC
  Filled 2017-03-11: qty 6

## 2017-03-11 MED ORDER — OXYCODONE HCL 5 MG/5ML PO SOLN
5.0000 mg | Freq: Once | ORAL | Status: DC | PRN
Start: 2017-03-11 — End: 2017-03-11
  Filled 2017-03-11: qty 5

## 2017-03-11 MED ORDER — FENTANYL CITRATE (PF) 100 MCG/2ML IJ SOLN
INTRAMUSCULAR | Status: AC
  Filled 2017-03-11: qty 2

## 2017-03-11 MED ORDER — PROPOFOL 10 MG/ML IV BOLUS
INTRAVENOUS | Status: AC
  Filled 2017-03-11: qty 20

## 2017-03-11 MED ORDER — LIDOCAINE-EPINEPHRINE (PF) 1 %-1:200000 IJ SOLN
INTRAMUSCULAR | Status: AC
  Filled 2017-03-11: qty 30

## 2017-03-11 MED ORDER — LACTATED RINGERS IV SOLN
INTRAVENOUS | Status: DC
  Administered 2017-03-11: 07:00:00 via INTRAVENOUS
  Filled 2017-03-11: qty 1000

## 2017-03-11 MED ORDER — CEFAZOLIN SODIUM-DEXTROSE 2-4 GM/100ML-% IV SOLN
INTRAVENOUS | Status: AC
  Filled 2017-03-11: qty 100

## 2017-03-11 MED ORDER — SODIUM CHLORIDE 0.9% FLUSH
3.0000 mL | INTRAVENOUS | Status: DC | PRN
  Filled 2017-03-11: qty 3

## 2017-03-11 MED ORDER — BUPIVACAINE HCL (PF) 0.25 % IJ SOLN
INTRAMUSCULAR | Status: AC
  Filled 2017-03-11: qty 30

## 2017-03-11 MED ORDER — ONDANSETRON HCL 4 MG/2ML IJ SOLN
INTRAMUSCULAR | Status: AC
  Filled 2017-03-11: qty 2

## 2017-03-11 MED ORDER — ACETAMINOPHEN 650 MG RE SUPP
650.0000 mg | RECTAL | Status: DC | PRN
  Filled 2017-03-11: qty 1

## 2017-03-11 MED ORDER — SODIUM BICARBONATE 4 % IV SOLN
INTRAVENOUS | Status: DC | PRN
  Administered 2017-03-11: 2 mL via INTRAVENOUS

## 2017-03-11 MED ORDER — ACETAMINOPHEN 325 MG PO TABS
650.0000 mg | ORAL_TABLET | ORAL | Status: DC | PRN
  Filled 2017-03-11: qty 2

## 2017-03-11 MED ORDER — FENTANYL CITRATE (PF) 100 MCG/2ML IJ SOLN
INTRAMUSCULAR | Status: DC | PRN
  Administered 2017-03-11: 25 ug via INTRAVENOUS

## 2017-03-11 MED ORDER — LIDOCAINE-EPINEPHRINE (PF) 1 %-1:200000 IJ SOLN
INTRAMUSCULAR | Status: DC | PRN
  Administered 2017-03-11: 6 mL

## 2017-03-11 MED ORDER — LIDOCAINE 2% (20 MG/ML) 5 ML SYRINGE
INTRAMUSCULAR | Status: AC
  Filled 2017-03-11: qty 5

## 2017-03-11 MED ORDER — BUPIVACAINE HCL (PF) 0.25 % IJ SOLN
INTRAMUSCULAR | Status: DC | PRN
  Administered 2017-03-11: 6 mL

## 2017-03-11 MED ORDER — SODIUM BICARBONATE 4 % IV SOLN
INTRAVENOUS | Status: AC
  Filled 2017-03-11: qty 5

## 2017-03-11 MED ORDER — MIDAZOLAM HCL 5 MG/5ML IJ SOLN
INTRAMUSCULAR | Status: DC | PRN
  Administered 2017-03-11: 2 mg via INTRAVENOUS

## 2017-03-11 MED ORDER — CEFAZOLIN SODIUM-DEXTROSE 2-4 GM/100ML-% IV SOLN
2.0000 g | INTRAVENOUS | Status: AC
  Administered 2017-03-11: 2 g via INTRAVENOUS
  Filled 2017-03-11: qty 100

## 2017-03-11 SURGICAL SUPPLY — 46 items
ADH SKN CLS APL DERMABOND .7 (GAUZE/BANDAGES/DRESSINGS) ×1
APL SKNCLS STERI-STRIP NONHPOA (GAUZE/BANDAGES/DRESSINGS) ×1
BENZOIN TINCTURE PRP APPL 2/3 (GAUZE/BANDAGES/DRESSINGS) ×3 IMPLANT
BLADE SURG 15 STRL LF DISP TIS (BLADE) ×1 IMPLANT
BLADE SURG 15 STRL SS (BLADE) ×3
CLEANER CAUTERY TIP 5X5 PAD (MISCELLANEOUS) ×1 IMPLANT
CLOSURE WOUND 1/2 X4 (GAUZE/BANDAGES/DRESSINGS) ×1
CLOTH BEACON ORANGE TIMEOUT ST (SAFETY) ×3 IMPLANT
COVER BACK TABLE 60X90IN (DRAPES) ×3 IMPLANT
COVER MAYO STAND STRL (DRAPES) ×3 IMPLANT
DERMABOND ADVANCED (GAUZE/BANDAGES/DRESSINGS) ×2
DERMABOND ADVANCED .7 DNX12 (GAUZE/BANDAGES/DRESSINGS) IMPLANT
DRAPE LAPAROTOMY 100X72 PEDS (DRAPES) ×3 IMPLANT
DRESSING TELFA ISLAND 4X8 (GAUZE/BANDAGES/DRESSINGS) ×2 IMPLANT
DRSG OPSITE 11X17.75 LRG (GAUZE/BANDAGES/DRESSINGS) IMPLANT
DRSG TEGADERM 4X4.75 (GAUZE/BANDAGES/DRESSINGS) IMPLANT
DRSG TELFA 3X8 NADH (GAUZE/BANDAGES/DRESSINGS) ×3 IMPLANT
ELECT REM PT RETURN 9FT ADLT (ELECTROSURGICAL) ×3
ELECTRODE REM PT RTRN 9FT ADLT (ELECTROSURGICAL) ×1 IMPLANT
GAUZE SPONGE 4X4 12PLY STRL LF (GAUZE/BANDAGES/DRESSINGS) IMPLANT
GLOVE BIO SURGEON STRL SZ8 (GLOVE) ×3 IMPLANT
GOWN W/2 COTTON TOWELS 2 STD (GOWNS) ×3 IMPLANT
KIT RM TURNOVER CYSTO AR (KITS) ×3 IMPLANT
MANIFOLD NEPTUNE II (INSTRUMENTS) IMPLANT
NDL HYPO 25X1 1.5 SAFETY (NEEDLE) ×1 IMPLANT
NEEDLE HYPO 25X1 1.5 SAFETY (NEEDLE) ×3 IMPLANT
NS IRRIG 500ML POUR BTL (IV SOLUTION) ×3 IMPLANT
PACK BASIN DAY SURGERY FS (CUSTOM PROCEDURE TRAY) ×3 IMPLANT
PAD CLEANER CAUTERY TIP 5X5 (MISCELLANEOUS) ×2
PAD DRESSING TELFA 3X8 NADH (GAUZE/BANDAGES/DRESSINGS) ×1 IMPLANT
PENCIL BUTTON HOLSTER BLD 10FT (ELECTRODE) ×3 IMPLANT
STRIP CLOSURE SKIN 1/2X4 (GAUZE/BANDAGES/DRESSINGS) ×2 IMPLANT
SUT MNCRL AB 4-0 PS2 18 (SUTURE) ×2 IMPLANT
SUT MON AB 4-0 PC3 18 (SUTURE) ×3 IMPLANT
SUT VIC AB 3-0 SH 27 (SUTURE) ×6
SUT VIC AB 3-0 SH 27X BRD (SUTURE) IMPLANT
SUT VIC AB 4-0 SH 27 (SUTURE) ×3
SUT VIC AB 4-0 SH 27XANBCTRL (SUTURE) ×1 IMPLANT
SYR BULB 3OZ (MISCELLANEOUS) ×3 IMPLANT
SYR CONTROL 10ML LL (SYRINGE) ×3 IMPLANT
TAPE PAPER 2X10 WHT MICROPORE (GAUZE/BANDAGES/DRESSINGS) ×2 IMPLANT
TOWEL OR 17X24 6PK STRL BLUE (TOWEL DISPOSABLE) ×6 IMPLANT
TRAY DSU PREP LF (CUSTOM PROCEDURE TRAY) ×3 IMPLANT
TUBE CONNECTING 12'X1/4 (SUCTIONS)
TUBE CONNECTING 12X1/4 (SUCTIONS) IMPLANT
WATER STERILE IRR 500ML POUR (IV SOLUTION) IMPLANT

## 2017-03-11 NOTE — Discharge Instructions (Addendum)
Sit upright for at least 4 hours.  Keep bandage dry in 2 days.  May shower in 2 days and remove bandage.  Ice pack to the area for the next 2 days.  Call for heavy bleeding or wound problems.  Take Tylenol or Ibuprofen for pain.  Follow up appointment for wound check in 3-4 weeks.  Please call the office to make this appointment [phone]).    Post Anesthesia Home Care Instructions  Activity: Get plenty of rest for the remainder of the day. A responsible individual must stay with you for 24 hours following the procedure.  For the next 24 hours, DO NOT: -Drive a car -Paediatric nurse -Drink alcoholic beverages -Take any medication unless instructed by your physician -Make any legal decisions or sign important papers.  Meals: Start with liquid foods such as gelatin or soup. Progress to regular foods as tolerated. Avoid greasy, spicy, heavy foods. If nausea and/or vomiting occur, drink only clear liquids until the nausea and/or vomiting subsides. Call your physician if vomiting continues.  Special Instructions/Symptoms: Your throat may feel dry or sore from the anesthesia or the breathing tube placed in your throat during surgery. If this causes discomfort, gargle with warm salt water. The discomfort should disappear within 24 hours.  If you had a scopolamine patch placed behind your ear for the management of post- operative nausea and/or vomiting:  1. The medication in the patch is effective for 72 hours, after which it should be removed.  Wrap patch in a tissue and discard in the trash. Wash hands thoroughly with soap and water. 2. You may remove the patch earlier than 72 hours if you experience unpleasant side effects which may include dry mouth, dizziness or visual disturbances. 3. Avoid touching the patch. Wash your hands with soap and water after contact with the patch.

## 2017-03-11 NOTE — H&P (View-Only) (Signed)
Meghan Oliver 02/22/2017 10:50 AM Location: [facility] Patient #: [id number] DOB: 1941/06/03 Married / Language: English / Race: White Female  History of Present Illness Meghan Hollingshead MD; 02/22/2017 11:22 AM) The patient is a 76 year old female.   Note:She is self-referred. She presents today for discussion of Port-A-Cath removal. She has major depressive disorder and had intense ECT treatments beginning in August. She needed good IV access for these. A Port-A-Cath was placed at [facility]. She no longer needs the IV access and comes in today to discuss Port-A-Cath removal. Her nurse is here with her.  Past Surgical History Meghan Oliver, [place]; 02/22/2017 10:50 AM) Appendectomy Breast Augmentation Bilateral. Breast Biopsy Left. Colon Polyp Removal - Colonoscopy Foot Surgery Bilateral. Hysterectomy (not due to cancer) - Partial  Diagnostic Studies History Meghan Oliver, [place]; 02/22/2017 10:50 AM) Colonoscopy within last year Mammogram 1-3 years ago Pap Smear 1-5 years ago  Allergies Meghan Oliver, [place]; 02/22/2017 10:54 AM) Propranolol HCl *BETA BLOCKERS* bradycardia Rexulti *ANTIPSYCHOTICS/ANTIMANIC AGENTS* Catatonic aphagia Allergies Reconciled  Medication History Meghan Oliver, RMA; 02/22/2017 11:01 AM) LORazepam (1MG Tablet, Oral) Active. AmLODIPine Besylate (10MG Tablet, Oral) Active. Celecoxib (200MG Capsule, Oral) Active. Irbesartan (150MG Tablet, Oral) Active. Levothyroxine Sodium (75MCG Tablet, Oral) Active. Lithium Carbonate ER (300MG Tablet ER, Oral) Active. Nortriptyline HCl (50MG Capsule, Oral) Active. Omeprazole (40MG Capsule DR, Oral) Active. Spironolactone (25MG Tablet, Oral) Active. [facility] Tablet ER 24HR, Oral) Active. Primidone (50MG Tablet, Oral) Active. Memantine HCl (10MG Tablet, Oral) Active. Paxil (20MG Tablet, Oral) Active. Multiple Vitamin (Oral) Active. Vitamin B Complex (Oral) Active. Vitamin B-12 (Oral)  Specific strength unknown - Active. Caltrate 600+D Plus Minerals (600-800MG-UNIT Tablet, Oral) Active. cocovia Active. Cerefolin NAC (Oral) Specific strength unknown - Active. Medications Reconciled  Social History Meghan Oliver, [place]; 02/22/2017 10:50 AM) Alcohol use Recently quit alcohol use. Caffeine use Coffee. No drug use Tobacco use Former smoker.  Family History Meghan Oliver, [place]; 02/22/2017 10:50 AM) Alcohol Abuse Mother. Depression Daughter. Heart Disease Brother, Father. Heart disease in female family member before age 64 Hypertension Brother, Father. Respiratory Condition Mother.  Pregnancy / Birth History Meghan Oliver, [place]; 02/22/2017 10:50 AM) Age at menarche 70 years. Age of menopause 76-50 Contraceptive History Intrauterine device. Gravida 2 Maternal age 70-25 Para 2 Regular periods  Other Problems Meghan Oliver, [place]; 02/22/2017 10:50 AM) Alcohol Abuse Anxiety Disorder Arthritis Back Pain Depression Gastroesophageal Reflux Disease High blood pressure Hypercholesterolemia Sleep Apnea Thyroid Disease     Review of Systems Meghan Oliver RMA; 02/22/2017 10:50 AM) General Not Present- Appetite Loss, Chills, Fatigue, Fever, Night Sweats, Weight Gain and Weight Loss. Skin Not Present- Change in Wart/Mole, Dryness, Hives, Jaundice, New Lesions, Non-Healing Wounds, Rash and Ulcer. HEENT Not Present- Earache, Hearing Loss, Hoarseness, Nose Bleed, Oral Ulcers, Ringing in the Ears, Seasonal Allergies, Sinus Pain, Sore Throat, Visual Disturbances, Wears glasses/contact lenses and Yellow Eyes. Respiratory Not Present- Bloody sputum, Chronic Cough, Difficulty Breathing, Snoring and Wheezing. Breast Not Present- Breast Mass, Breast Pain, Nipple Discharge and Skin Changes. Cardiovascular Not Present- Chest Pain, Difficulty Breathing Lying Down, Leg Cramps, Palpitations, Rapid Heart Rate, Shortness of Breath and Swelling of  Extremities. Gastrointestinal Not Present- Abdominal Pain, Bloating, Bloody Stool, Change in Bowel Habits, Chronic diarrhea, Constipation, Difficulty Swallowing, Excessive gas, Gets full quickly at meals, Hemorrhoids, Indigestion, Nausea, Rectal Pain and Vomiting. Female Genitourinary Not Present- Frequency, Nocturia, Painful Urination, Pelvic Pain and Urgency. Musculoskeletal Present- Back Pain and Joint Pain. Not Present- Joint Stiffness, Muscle Pain, Muscle Weakness and Swelling of  Extremities. Neurological Present- Decreased Memory. Not Present- Fainting, Headaches, Numbness, Seizures, Tingling, Tremor, Trouble walking and Weakness. Psychiatric Present- Anxiety, Bipolar and Depression. Not Present- Change in Sleep Pattern, Fearful and Frequent crying. Endocrine Not Present- Cold Intolerance, Excessive Hunger, Hair Changes, Heat Intolerance, Hot flashes and New Diabetes. Hematology Not Present- Blood Thinners, Easy Bruising, Excessive bleeding, Gland problems, HIV and Persistent Infections.  [employer] Oliver RMA; 02/22/2017 11:01 AM) 02/22/2017 11:01 AM Weight: 111.6 lb Height: 63in Body Surface Area: 1.51 m Body Mass Index: 19.77 kg/m  Temp.: 98.30F  Pulse: 63 (Regular)  P.OX: 93% (Room air) BP: 150/72 (Sitting, Left Arm, Standard)      Physical Exam Meghan Hollingshead MD; 02/22/2017 11:25 AM)  The physical exam findings are as follows: Note:GENERAL APPEARANCE: Thin female in NAD. Pleasant and cooperative.  EARS, NOSE, MOUTH THROAT: [place]/AT external ears: no lesions or deformities external nose: no lesions or deformities hearing: grossly normal lips: moist, no deformities EYES external: conjunctiva, lids, sclerae normal pupils: equal, round glasses: no  NECK: Supple, catheter noted tracking under the skin into the right neck, no trachea deviation  CHEST/RESP Small right upper chest wall scar with underlying palpable port.  NEUROLOGIC speech:  normal  PSYCHIATRIC alertness and orientation: normal mood/affect/behavior: normal judgement and insight: normal    Assessment & Plan Meghan Hollingshead MD; 02/22/2017 11:21 AM)  [name] IN PLACE (O16.073) Impression: She no longer needs the Port-A-Cath.  Plan: Port-A-Cath removal, local with MAC. We discussed the procedure and risks. The risks include but are not limited to bleeding, infection, wound healing problems, reaction to anesthesia. She and her nurse, soon understand this and agree with the plan. I asked them to stop her aspirin 5 days before the procedure.  Jackolyn Confer, M.D.

## 2017-03-11 NOTE — Anesthesia Postprocedure Evaluation (Signed)
Anesthesia Post Note  Patient: Meghan Oliver  Procedure(s) Performed: Procedure(s) (LRB): REMOVAL PORT-A-CATH (N/A)     Patient location during evaluation: PACU Anesthesia Type: MAC Level of consciousness: awake, awake and alert and oriented Pain management: pain level controlled Vital Signs Assessment: post-procedure vital signs reviewed and stable Respiratory status: spontaneous breathing, nonlabored ventilation and respiratory function stable Cardiovascular status: blood pressure returned to baseline Anesthetic complications: no    Last Vitals:  Vitals:   03/11/17 0900 03/11/17 0915  BP: (!) 117/47 (!) 109/53  Pulse: (!) 54 (!) 50  Resp: 15 14  Temp:      Last Pain:  Vitals:   03/11/17 0646  TempSrc:   PainSc: 5                  Shaunte Tuft COKER

## 2017-03-11 NOTE — Anesthesia Procedure Notes (Signed)
Procedure Name: MAC Date/Time: 03/11/2017 7:35 AM Performed by: Wanita Chamberlain Pre-anesthesia Checklist: Patient identified, Timeout performed, Emergency Drugs available, Suction available and Patient being monitored Patient Re-evaluated:Patient Re-evaluated prior to inductionOxygen Delivery Method: Nasal cannula Intubation Type: IV induction Placement Confirmation: breath sounds checked- equal and bilateral and positive ETCO2

## 2017-03-11 NOTE — Transfer of Care (Signed)
Immediate Anesthesia Transfer of Care Note  Patient: Meghan Oliver  Procedure(s) Performed: Procedure(s): REMOVAL PORT-A-CATH (N/A)  Patient Location: PACU  Anesthesia Type:MAC  Level of Consciousness: awake, alert , oriented and patient cooperative  Airway & Oxygen Therapy: Patient Spontanous Breathing and Patient connected to nasal cannula oxygen  Post-op Assessment: Report given to RN and Post -op Vital signs reviewed and stable  Post vital signs: Reviewed and stable  Last Vitals:  Vitals:   03/11/17 0603 03/11/17 0819  BP: (!) 156/61   Pulse: 60   Resp: 14   Temp: 36.9 C 36.6 C    Last Pain:  Vitals:   03/11/17 0646  TempSrc:   PainSc: 5       Patients Stated Pain Goal: 5 (53/66/44 0347)  Complications: No apparent anesthesia complications

## 2017-03-11 NOTE — Interval H&P Note (Signed)
History and Physical Interval Note:  03/11/2017 7:27 AM  Meghan Oliver  has presented today for surgery, with the diagnosis of Port a cath in place  The various methods of treatment have been discussed with the patient and family. After consideration of risks, benefits and other options for treatment, the patient has consented to  Procedure(s): REMOVAL PORT-A-CATH (N/A) as a surgical intervention .  The patient's history has been reviewed, patient examined, no change in status, stable for surgery.  I have reviewed the patient's chart and labs.  Questions were answered to the patient's satisfaction.     Margarette Vannatter Lenna Sciara

## 2017-03-11 NOTE — Anesthesia Preprocedure Evaluation (Addendum)
Anesthesia Evaluation  Patient identified by MRN, date of birth, ID band Patient awake    Reviewed: Allergy & Precautions, NPO status , Patient's Chart, lab work & pertinent test results  Airway Mallampati: II  TM Distance: >3 FB Neck ROM: Full    Dental  (+) Teeth Intact, Dental Advisory Given,    Pulmonary sleep apnea , former smoker,    breath sounds clear to auscultation       Cardiovascular hypertension, + CAD and + Peripheral Vascular Disease  + Valvular Problems/Murmurs  Rhythm:Regular Rate:Normal     Neuro/Psych  Headaches, Depression Bipolar Disorder    GI/Hepatic PUD, GERD  Medicated,  Endo/Other  Hypothyroidism   Renal/GU      Musculoskeletal   Abdominal   Peds  Hematology   Anesthesia Other Findings   Reproductive/Obstetrics                           Anesthesia Physical Anesthesia Plan  ASA: III  Anesthesia Plan: MAC   Post-op Pain Management:    Induction: Intravenous  PONV Risk Score and Plan: Ondansetron  Airway Management Planned: Natural Airway and Simple Face Mask  Additional Equipment:   Intra-op Plan:   Post-operative Plan:   Informed Consent: I have reviewed the patients History and Physical, chart, labs and discussed the procedure including the risks, benefits and alternatives for the proposed anesthesia with the patient or authorized representative who has indicated his/her understanding and acceptance.     Plan Discussed with: CRNA and Anesthesiologist  Anesthesia Plan Comments:         Anesthesia Quick Evaluation

## 2017-03-11 NOTE — Op Note (Signed)
OPERATIVE NOTE-PORT-A-CATH REMOVAL  PREOPERATIVE DIAGNOSIS:  Retain Port-a-cath.  POSTOPERATIVE DIAGNOSIS:  Same  PROCEDURE:    Porta-cath removal  SURGEON:  Jackolyn Confer, M.D.  ANESTHESIA:  Local (mixture of Xylocaine and Marcaine) with MAC  EBL:  < 50 cc  INDICATION:  This is a 76 year old female with a Porta-cath that is no longer needed.  She now presents for removal.  The procedure, risks, and aftercare have been explained preoperatively.   The right upper chest wall and neck were sterilely prepped and draped. Local anesthetic was infiltrated at the site of the port in the chest wall superficially and deep. The previous scar was incised sharply and then using electrocautery the subcutaneous tissue was divided until the port and catheter were identified.  The fibrous sheath was dissected free from the catheter and it was pulled out of the internal jugular/subclavian vein. Direct pressure was held over the vein site for 10-15 minutes.  The port was then dissected free from the chest wall using electrocautery. The port and catheter were removed intact. The chest wall area was inspected and bleeding controlled with electrocautery. Once hemostasis was adequate, the chest wall incision was closed in 2 layers. The subcutaneous tissues approximated with running 3-0 Vicryl suture. The skin was closed with a running 4-0 Monocryl subcuticular stitch. Dermabond and a sterile dressing were applied.  She tolerated the procedure well without any apparent complications and was taken to the recovery room in satisfactory condition.

## 2017-03-11 NOTE — Addendum Note (Signed)
Addendum  created 03/11/17 1018 by Wanita Chamberlain, CRNA   [facility] administration accepted

## 2017-03-14 ENCOUNTER — Encounter: Payer: Medicare Other | Admitting: Psychology

## 2017-03-14 ENCOUNTER — Encounter (HOSPITAL_BASED_OUTPATIENT_CLINIC_OR_DEPARTMENT_OTHER): Payer: Self-pay | Admitting: General Surgery

## 2017-03-23 ENCOUNTER — Ambulatory Visit (INDEPENDENT_AMBULATORY_CARE_PROVIDER_SITE_OTHER): Payer: Medicare Other | Admitting: Gastroenterology

## 2017-03-23 ENCOUNTER — Encounter: Payer: Self-pay | Admitting: Gastroenterology

## 2017-03-23 ENCOUNTER — Other Ambulatory Visit: Payer: Self-pay | Admitting: Neurology

## 2017-03-23 VITALS — BP 108/56 | HR 62 | Wt 110.0 lb

## 2017-03-23 DIAGNOSIS — R5383 Other fatigue: Secondary | ICD-10-CM | POA: Diagnosis not present

## 2017-03-23 DIAGNOSIS — K219 Gastro-esophageal reflux disease without esophagitis: Secondary | ICD-10-CM | POA: Diagnosis not present

## 2017-03-23 DIAGNOSIS — E538 Deficiency of other specified B group vitamins: Secondary | ICD-10-CM | POA: Diagnosis not present

## 2017-03-23 DIAGNOSIS — K5900 Constipation, unspecified: Secondary | ICD-10-CM

## 2017-03-23 DIAGNOSIS — D508 Other iron deficiency anemias: Secondary | ICD-10-CM | POA: Diagnosis not present

## 2017-03-23 MED ORDER — PANTOPRAZOLE SODIUM 40 MG PO TBEC: 40.0000 mg | DELAYED_RELEASE_TABLET | Freq: Two times a day (BID) | ORAL | 3 refills | Status: DC

## 2017-03-23 NOTE — Progress Notes (Signed)
Meghan Oliver    [id number]    [birth year]  Primary Care Physician:Baxley, Cresenciano Lick, MD  Referring Physician: [name], MD [address] [place], [place] [address]  Chief complaint:  GERD  HPI:  9 yr F here for follow up accompanied by her husband Dr Lyla Son. She continues to have breakthrough heartburn and has been taking gaviscon and mylanta three times daily as needed. She is also taking Omeprazole twice daily, before breakfast and at bedtime along with ranitidine. She sleeps with head elevation. Per Dr Inocente Salles, she likes to eat a pint of vanilla ice cream before going to bed.   She continues to be constipated with irregular bowel movements, did not start taking Linzess yet.    Outpatient Encounter Prescriptions as of 03/23/2017  Medication Sig  . amLODipine (NORVASC) 10 MG tablet TAKE 1 TABLET(10 MG) BY MOUTH DAILY  . aspirin 81 MG tablet Take 81 mg by mouth daily.    . Calcium Carbonate-Vitamin D (CALCIUM 600/VITAMIN D PO) Take 1 tablet by mouth 2 (two) times daily.   . celecoxib (CELEBREX) 200 MG capsule TAKE 1 CAPSULE BY MOUTH DAILY  . cetirizine (ZYRTEC) 10 MG tablet Take 10 mg by mouth 3 times/day as needed-between meals & bedtime.   . Cholecalciferol (VITAMIN D3) 2000 UNITS TABS Take 2 tablets by mouth daily.   Marland Kitchen docusate-casanthranol (PERICOLACE) 100-30 MG per capsule daily as needed.    . irbesartan (AVAPRO) 150 MG tablet Take 150 mg by mouth daily.  Marland Kitchen L-Methylfolate-B12-B6-B2 (CEREFOLIN) 03-04-49-5 MG TABS Take 1 tablet by mouth at bedtime.  Marland Kitchen levothyroxine (SYNTHROID, LEVOTHROID) 75 MCG tablet TAKE 1 TABLET(75 MCG) BY MOUTH DAILY  . LITHIUM PO Take 150 mg by mouth at bedtime. Takes every three days at HS  . LORazepam (ATIVAN) 0.5 MG tablet Take 0.5 mg by mouth 3 times/day as needed-between meals & bedtime for anxiety.   Marland Kitchen LORazepam (ATIVAN) 1 MG tablet Take 1 mg by mouth every 6 (six) hours as needed for anxiety.  . memantine (NAMENDA) 10 MG  tablet Take 10 mg by mouth 2 (two) times daily.  . Multiple Vitamins-Minerals (MULTIVITAMIN WITH MINERALS) tablet Take 1 tablet by mouth daily.    . nortriptyline (PAMELOR) 10 MG capsule Take 10 mg by mouth at bedtime.  . pantoprazole (PROTONIX) 40 MG tablet Take 1 tablet (40 mg total) by mouth daily.  Marland Kitchen PARoxetine (PAXIL) 20 MG tablet Take by mouth at bedtime.   . primidone (MYSOLINE) 50 MG tablet 2 tabs AM and 1 tab QHS  . RA KRILL OIL 500 MG CAPS Take by mouth.  . ranitidine (ZANTAC) 150 MG tablet Take 1 tablet (150 mg total) by mouth at bedtime.  . rosuvastatin (CRESTOR) 20 MG tablet TAKE 1 TABLET(20 MG) BY MOUTH DAILY  . spironolactone (ALDACTONE) 25 MG tablet Take 0.5 tablets (12.5 mg total) by mouth daily.  . TOVIAZ 8 MG TB24 tablet TAKE 1 TABLET BY MOUTH DAILY  . vitamin B-12 (CYANOCOBALAMIN) 1000 MCG tablet Take 1,000 mcg by mouth at bedtime.   No facility-administered encounter medications on file as of 03/23/2017.     Allergies as of 03/23/2017 - Review Complete 03/23/2017  Allergen Reaction Noted  . Propranolol Other (See Comments) 08/06/2013  . Brexpiprazole Other (See Comments) 11/27/2015    Past Medical History:  Diagnosis Date  . Bipolar II disorder ([place])   . CKD (chronic kidney disease), stage II   . Esophagitis,  erosive   . GAD (generalized anxiety disorder)   . GERD (gastroesophageal reflux disease)   . Heart murmur, systolic   . History of adenomatous polyp of colon    08-04-2016  tubular adenoma  . History of electroconvulsive therapy    at [place]--  started 04-15-2015 to 11-17-2016  total greater than 40 times  . Hyperlipidemia   . Hypertension   . Hypothyroidism   . Internal carotid artery stenosis, bilateral    per last duplex 05-01-2014  bilateral ICA 40-59%  . Low back pain   . Major depression, chronic    ECT treatments extensive and multiple started 07/ 2016  . Migraines   . OSA (obstructive sleep apnea)    per study 06/ 2012 moderate OSA    .  Osteoporosis   . Pulmonary nodule    monitored by pcp    Past Surgical History:  Procedure Laterality Date  . ABDOMINAL SURGERY     Laparotomy Ovarian cystctomy  . APPENDECTOMY  1971  . BREAST ENHANCEMENT SURGERY Bilateral   . CARDIOVASCULAR STRESS TEST  01-30-2015  dr Aundra Dubin   Low risk nuclear study w/ no evidence ischemia or infarction/  normal LV funciton and wall motion , 76%  . COLONOSCOPY WITH ESOPHAGOGASTRODUODENOSCOPY (EGD)  last one 08-04-2016  . ESOPHAGOGASTRODUODENOSCOPY  02-26-04  . PORT-A-CATH PLACEMENT  05-31-2016   [facility]  . PORT-A-CATH REMOVAL N/A 03/11/2017   Procedure: REMOVAL PORT-A-CATH;  Surgeon: Jackolyn Confer, MD;  Location: [facility];  Service: General;  Laterality: N/A;  . TRANSTHORACIC ECHOCARDIOGRAM  09/05/2013  dr Aundra Dubin   mild LVH, ef 22-29%, grade 1 diastolic dysfunction/  very mild AV stenosis with mild AR/  trivial MR and PT/ mild to moderate LAE/ mild TR/ mild pulmonary hypertension with PA peak pressure 73mmHg  . TUBAL LIGATION      Family History  Problem Relation Age of Onset  . Heart attack Father 59       deceased  . Hypertension Father   . Heart disease Father   . Breast cancer Paternal Grandmother        Age unknown  . Breast cancer Paternal Aunt        Age 76's  . Colon cancer Neg Hx     Social History   Social History  . Marital status: Married    Spouse name: Dr. Lyla Son  . Number of children: 2  . Years of education: N/A   Occupational History  . housewife Unemployed   Social History Main Topics  . Smoking status: Former Smoker    Packs/day: 2.00    Years: 15.00    Types: Cigarettes    Quit date: 01/13/1971  . Smokeless tobacco: Never Used  . Alcohol use 1.8 oz/week    3 Glasses of wine per week     Comment: wine, three times per week  . Drug use: No  . Sexual activity: No     Comment: intercourse age 8, sexual partners less than 5   Other Topics Concern  . Not on file   Social History  Narrative  . No narrative on file      Review of systems: Review of Systems  Constitutional: Negative for fever and chills.  HENT: Negative.   Eyes: Negative for blurred vision.  Respiratory: Negative for cough, shortness of breath and wheezing.   Cardiovascular: Negative for chest pain and palpitations.  Gastrointestinal: as per HPI Genitourinary: Negative for dysuria, urgency, frequency and hematuria.  Musculoskeletal:  Negative for myalgias, and joint pain.  positive for back pain Skin: Negative for itching and rash.  Neurological: Negative for dizziness, tremors, focal weakness, seizures and loss of consciousness.  Endo/Heme/Allergies: Positive for seasonal allergies.  Psychiatric/Behavioral: Negative for suicidal ideas and hallucinations.  positive for depression All other systems reviewed and are negative.   Physical Exam: Vitals:   03/23/17 1000  BP: (!) 108/56  Pulse: 62   Body mass index is 22.99 kg/m. Gen:      No acute distress HEENT:  EOMI, sclera anicteric Neck:     No masses; no thyromegaly Lungs:    Clear to auscultation bilaterally; normal respiratory effort CV:         Regular rate and rhythm; +systolic murmur Abd:      + bowel sounds; soft, non-tender; no palpable masses, no distension Ext:    No edema; adequate peripheral perfusion Skin:      Warm and dry; no rash Neuro: alert and oriented x 3 Psych: normal mood and affect  Data Reviewed:  Reviewed labs, radiology imaging, old records and pertinent past GI work up  EGD November 2017 revealed evidence of hiatal hernia and severe erosive esophagitis. Colonoscopy with removal of small tubular adenoma and sigmoid diverticulosis  Assessment and Plan/Recommendations:  76 yr Female with history of GERD, hiatal hernia, chronic constipation  GERD: Protonix 40 mg twice daily, 30 minutes before breakfast and dinner Advised to avoid taking Mylanta or ranitidine at the same time as PPI Small frequent  meals Reinforced life style modifications and antireflux measures May also have a component of functional dyspepsia: trial of FD Gard 1 capsule TiD prn Will hold off EGD for now  Constipation with irregular bowel habits Linzess 72 mcg daily Increase dietary fluid and fiber intake  Systolic murmur: likely flow murmur Advised to follow up with PMD/cardiology  Fatigue: Follow up B12, iron and ferritin  25 minutes was spent face-to-face with the patient. Greater than 50% of the time used for counseling as well as treatment plan and follow-up. She had multiple questions which were answered to her satisfaction  K. Denzil Magnuson , MD [phone] Mon-Fri 8a-5p [phone] after 5p, weekends, holidays  CC: [name], MD

## 2017-03-23 NOTE — Patient Instructions (Signed)
Go to the basement today for labs  Use Linzess 72 mcg for constipation  Use Gavison three times a day as needed or FDGard 1-2 capsules three times a day as needed  We will send Protonix to your pharmacy  Continue Zantac at bedtime  Call in one week to let us know how you are doing

## 2017-03-24 ENCOUNTER — Telehealth: Payer: Self-pay | Admitting: Neurology

## 2017-03-24 NOTE — Telephone Encounter (Signed)
I denied refill. Hasn't been seen since 2016. Please advise.

## 2017-03-24 NOTE — Telephone Encounter (Signed)
Tried to call the number given. Patient and his assistant did not know what I was talking about. They gave me the number to reach her nurse, [name], at [phone]. I called and LMOM for her to call back.

## 2017-03-24 NOTE — Telephone Encounter (Signed)
She can be put on a cancellation list but I cannot fill this medication without an appointment given length of time (nearly 2 years) since last visit

## 2017-03-24 NOTE — Telephone Encounter (Signed)
Caller: Patient's Nurse  Urgent? Yes  Reason for the call: Her nurse is calling regarding needing her medication Primidone filled. She has a follow up scheduled for 07/11/17. She uses [facility] on [address]. Thanks

## 2017-03-25 ENCOUNTER — Telehealth: Payer: Self-pay | Admitting: Internal Medicine

## 2017-03-25 MED ORDER — PRIMIDONE 50 MG PO TABS: ORAL_TABLET | ORAL | 1 refills | Status: DC

## 2017-03-25 NOTE — Telephone Encounter (Signed)
Meghan Oliver is calling for patient; states that she has been on Primidone 50mg  in the past 3 years for Tremors r/t Lithium.  Dr. Carles Collet doesn't want to refill this for her because she hasn't seen him.  He cannot see her until November.  Meghan Oliver wants to know if you would be willing to refill this for her 90 day and 1 refill until they can get her in to see Dr. Carles Collet?    She hasn't had any side effects with the medication, but she is concerned about her stopping the medication.  She takes 2 in the morning and 1 qhs.    Pharmacy:  [facility] at [address] [place].  Meghan Oliver's phone #:  [phone]  Thank you.

## 2017-03-25 NOTE — Telephone Encounter (Signed)
Meghan Oliver is aware and medication was refilled

## 2017-03-25 NOTE — Telephone Encounter (Signed)
Please go ahead and do this as requested and let [name] who is  Mrs Arbuthnot's nurse know

## 2017-03-25 NOTE — Telephone Encounter (Signed)
[name] made aware. Patient put on cancellation list.

## 2017-03-28 NOTE — Telephone Encounter (Signed)
Prolia was due in April 2018, Meghan Oliver is not covered under her insurance. I will ask Meghan Oliver regarding how long to wait for Reclast infusion if Pt will call after dental surgery.

## 2017-03-29 ENCOUNTER — Other Ambulatory Visit: Payer: Self-pay

## 2017-03-29 ENCOUNTER — Encounter: Payer: Self-pay | Admitting: Gastroenterology

## 2017-03-29 ENCOUNTER — Other Ambulatory Visit (INDEPENDENT_AMBULATORY_CARE_PROVIDER_SITE_OTHER): Payer: Medicare Other

## 2017-03-29 ENCOUNTER — Other Ambulatory Visit: Payer: Medicare Other

## 2017-03-29 DIAGNOSIS — R5383 Other fatigue: Secondary | ICD-10-CM | POA: Diagnosis not present

## 2017-03-29 DIAGNOSIS — K5900 Constipation, unspecified: Secondary | ICD-10-CM | POA: Diagnosis not present

## 2017-03-29 DIAGNOSIS — M8589 Other specified disorders of bone density and structure, multiple sites: Secondary | ICD-10-CM

## 2017-03-29 DIAGNOSIS — K219 Gastro-esophageal reflux disease without esophagitis: Secondary | ICD-10-CM

## 2017-03-29 DIAGNOSIS — F310 Bipolar disorder, current episode hypomanic: Secondary | ICD-10-CM | POA: Diagnosis not present

## 2017-03-29 DIAGNOSIS — D508 Other iron deficiency anemias: Secondary | ICD-10-CM

## 2017-03-29 LAB — FOLATE: Folate: >24 ng/mL (ref >5.9)

## 2017-03-29 LAB — VITAMIN B12: Vitamin B-12: >1500 pg/mL — ABNORMAL HIGH (ref 211–911)

## 2017-03-29 LAB — FERRITIN: FERRITIN: 43.1 ng/mL (ref 10.0–291.0)

## 2017-03-29 LAB — IBC PANEL
Iron: 55 ug/dL (ref 42–145)
SATURATION RATIOS: 17.1 % — AB (ref 20.0–50.0)
TRANSFERRIN: 230 mg/dL (ref 212.0–360.0)

## 2017-03-29 MED ORDER — LINACLOTIDE 72 MCG PO CAPS: 72.0000 ug | ORAL_CAPSULE | Freq: Every day | ORAL | 3 refills | Status: DC

## 2017-03-29 NOTE — Telephone Encounter (Signed)
Please call and ask if she is had her dental work. Ask if she wants IV Reclast . Would she rather try a by mouth medication, review the Reclast is only once a year. She does spend half the year in [place]. Will need CMP within 30 days of reclast

## 2017-03-30 ENCOUNTER — Institutional Professional Consult (permissible substitution): Payer: Medicare Other | Admitting: Neurology

## 2017-03-30 LAB — LITHIUM LEVEL: LITHIUM LVL: 0.3 mmol/L — AB (ref 0.6–1.2)

## 2017-03-31 ENCOUNTER — Telehealth: Payer: Self-pay | Admitting: *Deleted

## 2017-03-31 NOTE — Telephone Encounter (Signed)
Linzesss approved until 03/30/2018 sent approval letter to be scanned in

## 2017-04-05 NOTE — Telephone Encounter (Signed)
Talked with Dr Inocente Salles and he stated that dental surgery would be in one month. I will follow up with [name] at a later date regarding Prolia. Calcium level 10.3  01/25/17

## 2017-04-07 ENCOUNTER — Telehealth: Payer: Self-pay | Admitting: Gastroenterology

## 2017-04-07 MED ORDER — LINACLOTIDE 72 MCG PO CAPS: 72.0000 ug | ORAL_CAPSULE | Freq: Three times a day (TID) | ORAL | 3 refills | Status: DC

## 2017-04-07 NOTE — Telephone Encounter (Signed)
linzess sent to pharmacy

## 2017-04-11 ENCOUNTER — Telehealth: Payer: Self-pay | Admitting: Gastroenterology

## 2017-04-11 ENCOUNTER — Other Ambulatory Visit: Payer: Medicare Other

## 2017-04-11 ENCOUNTER — Telehealth: Payer: Self-pay

## 2017-04-11 DIAGNOSIS — F319 Bipolar disorder, unspecified: Secondary | ICD-10-CM

## 2017-04-11 MED ORDER — LINACLOTIDE 145 MCG PO CAPS: 145.0000 ug | ORAL_CAPSULE | Freq: Every day | ORAL | 6 refills | Status: DC

## 2017-04-11 NOTE — Telephone Encounter (Signed)
Dr Lyla Son came in to request  RX for [place]. Mrs Flow is having persistent symptoms with bowel movement every 5-7 days despite taking Linzess 21mcg daily. She has taken 2-3 capsules on Sunday with bowel movement.  Gave smaples for Linzess 120mcg daily and sent Rx  Damaris Hippo , MD [phone] Mon-Fri 8a-5p [phone] after 5p, weekends, holidays

## 2017-04-11 NOTE — Telephone Encounter (Signed)
Pt was in lab and the lab called to request labs get drawn, spoke with Dr. Renold Genta and put in Lithium and BMet.

## 2017-04-12 DIAGNOSIS — Z79899 Other long term (current) drug therapy: Secondary | ICD-10-CM | POA: Diagnosis not present

## 2017-04-12 DIAGNOSIS — F333 Major depressive disorder, recurrent, severe with psychotic symptoms: Secondary | ICD-10-CM | POA: Diagnosis not present

## 2017-04-14 DIAGNOSIS — F3341 Major depressive disorder, recurrent, in partial remission: Secondary | ICD-10-CM | POA: Diagnosis not present

## 2017-04-18 DIAGNOSIS — F3181 Bipolar II disorder: Secondary | ICD-10-CM | POA: Diagnosis not present

## 2017-04-20 ENCOUNTER — Ambulatory Visit (INDEPENDENT_AMBULATORY_CARE_PROVIDER_SITE_OTHER): Payer: Medicare Other | Admitting: Cardiovascular Disease

## 2017-04-20 ENCOUNTER — Encounter: Payer: Self-pay | Admitting: Cardiovascular Disease

## 2017-04-20 ENCOUNTER — Telehealth: Payer: Self-pay | Admitting: Gastroenterology

## 2017-04-20 VITALS — BP 88/44 | HR 78 | Ht 58.5 in | Wt 108.8 lb

## 2017-04-20 DIAGNOSIS — R079 Chest pain, unspecified: Secondary | ICD-10-CM

## 2017-04-20 DIAGNOSIS — I959 Hypotension, unspecified: Secondary | ICD-10-CM

## 2017-04-20 LAB — TROPONIN T: Troponin T TROPT: <0.011 ng/mL (ref <0.011)

## 2017-04-20 NOTE — Telephone Encounter (Signed)
Ok to send Rx for Zegris BID 90 days with 3 refills. Thanks

## 2017-04-20 NOTE — Telephone Encounter (Signed)
Dr Nandigam please advise 

## 2017-04-20 NOTE — Patient Instructions (Addendum)
Medication Instructions:  HOLD Irbesartan HOLD Amlodipine HOLD Aldactone   Labwork: TODAY - Troponin, Complete metabolic panel, CBC   Testing/Procedures: None Ordered   Follow-Up: Your physician recommends that you schedule a follow-up appointment in: 2 days to see Dr. Saunders Revel on Friday July 20 at 1:40   If you need a refill on your cardiac medications before your next appointment, please call your pharmacy.   Thank you for choosing [facility]! Christen Bame, RN [phone]

## 2017-04-20 NOTE — Progress Notes (Signed)
Cardiology Office Note:    Date:  04/20/2017   ID:  Meghan Oliver, DOB [birth year], MRN [id number]  PCP:  [name], MD  Cardiologist:  Mertie Moores, MD    Referring MD: [name], MD   Chief Complaint  Patient presents with  . Chest Pain  . Hypotension    History of Present Illness:    Meghan Oliver is a 76 y.o. female with a hx of HTN, Hyperlipidemia. She is brought in today by her husband [name] with complaint of some chest discomfort. She's also been confused. Blood pressure was noted to be low when she checked in.  She has a history of bipolar disease carotid stenosis and hypertension.  Months of acid reflux. Discomfort around the sternal notch.  Had ECG.  Had a Hiatal hernia  with some erosions Has tried PPIs,   Has been confused for the past 10 days. Has had depression in the past  Last night, became very short of breath . No weight like sensation in her chest  Confusion has been worse over the past 10 days.   Lithius level was increased 2 days ago .   Appetite has been poor for months .  30 lb weight loss over 3 years.     Past Medical History:  Diagnosis Date  . Bipolar II disorder ([place])   . CKD (chronic kidney disease), stage II   . Esophagitis, erosive   . GAD (generalized anxiety disorder)   . GERD (gastroesophageal reflux disease)   . Heart murmur, systolic   . History of adenomatous polyp of colon    08-04-2016  tubular adenoma  . History of electroconvulsive therapy    at [facility]--  started 04-15-2015 to 11-17-2016  total greater than 40 times  . Hyperlipidemia   . Hypertension   . Hypothyroidism   . Internal carotid artery stenosis, bilateral    per last duplex 05-01-2014  bilateral ICA 40-59%  . Low back pain   . Major depression, chronic    ECT treatments extensive and multiple started 07/ 2016  . Migraines   . OSA (obstructive sleep apnea)    per study 06/ 2012 moderate OSA    . Osteoporosis   . Pulmonary nodule    monitored by pcp     Past Surgical History:  Procedure Laterality Date  . ABDOMINAL SURGERY     Laparotomy Ovarian cystctomy  . APPENDECTOMY  1971  . BREAST ENHANCEMENT SURGERY Bilateral   . CARDIOVASCULAR STRESS TEST  01-30-2015  dr Aundra Dubin   Low risk nuclear study w/ no evidence ischemia or infarction/  normal LV funciton and wall motion , 76%  . COLONOSCOPY WITH ESOPHAGOGASTRODUODENOSCOPY (EGD)  last one 08-04-2016  . ESOPHAGOGASTRODUODENOSCOPY  02-26-04  . PORT-A-CATH PLACEMENT  05-31-2016   [facility]  . PORT-A-CATH REMOVAL N/A 03/11/2017   Procedure: REMOVAL PORT-A-CATH;  Surgeon: Jackolyn Confer, MD;  Location: [facility];  Service: General;  Laterality: N/A;  . TRANSTHORACIC ECHOCARDIOGRAM  09/05/2013  dr Aundra Dubin   mild LVH, ef 39-03%, grade 1 diastolic dysfunction/  very mild AV stenosis with mild AR/  trivial MR and PT/ mild to moderate LAE/ mild TR/ mild pulmonary hypertension with PA peak pressure 71mmHg  . TUBAL LIGATION      Current Medications: Current Meds  Medication Sig  . amLODipine (NORVASC) 10 MG tablet TAKE 1 TABLET(10 MG) BY MOUTH DAILY  . aspirin 81 MG tablet Take 81 mg by mouth daily.    Marland Kitchen  Calcium Carbonate-Vitamin D (CALCIUM 600/VITAMIN D PO) Take 1 tablet by mouth 2 (two) times daily.   . celecoxib (CELEBREX) 200 MG capsule TAKE 1 CAPSULE BY MOUTH DAILY  . cetirizine (ZYRTEC) 10 MG tablet Take 10 mg by mouth 3 times/day as needed-between meals & bedtime.   . Cholecalciferol (VITAMIN D3) 2000 UNITS TABS Take 2 tablets by mouth daily.   Marland Kitchen docusate-casanthranol (PERICOLACE) 100-30 MG per capsule daily as needed.    . irbesartan (AVAPRO) 150 MG tablet Take 150 mg by mouth daily.  Marland Kitchen L-Methylfolate-B12-B6-B2 (CEREFOLIN) 03-04-49-5 MG TABS Take 1 tablet by mouth at bedtime.  Marland Kitchen levothyroxine (SYNTHROID, LEVOTHROID) 75 MCG tablet TAKE 1 TABLET(75 MCG) BY MOUTH DAILY  . linaclotide (LINZESS) 145 MCG CAPS capsule Take 1 capsule (145 mcg total) by mouth daily before breakfast.  .  linaclotide (LINZESS) 72 MCG capsule Take 1 capsule (72 mcg total) by mouth 3 (three) times daily.  Marland Kitchen LITHIUM PO Take 150 mg by mouth at bedtime. Takes every three days at HS  . LORazepam (ATIVAN) 0.5 MG tablet Take 0.5 mg by mouth 3 times/day as needed-between meals & bedtime for anxiety.   Marland Kitchen LORazepam (ATIVAN) 1 MG tablet Take 1 mg by mouth every 6 (six) hours as needed for anxiety.  . memantine (NAMENDA) 10 MG tablet Take 10 mg by mouth 2 (two) times daily.  . Multiple Vitamins-Minerals (MULTIVITAMIN WITH MINERALS) tablet Take 1 tablet by mouth daily.    . nortriptyline (PAMELOR) 10 MG capsule Take 10 mg by mouth at bedtime.  . pantoprazole (PROTONIX) 40 MG tablet Take 1 tablet (40 mg total) by mouth 2 (two) times daily before a meal.  . PARoxetine (PAXIL) 20 MG tablet Take by mouth at bedtime.   . primidone (MYSOLINE) 50 MG tablet 2 tabs AM and 1 tab QHS  . RA KRILL OIL 500 MG CAPS Take by mouth.  . ranitidine (ZANTAC) 150 MG tablet Take 1 tablet (150 mg total) by mouth at bedtime.  . rosuvastatin (CRESTOR) 20 MG tablet TAKE 1 TABLET(20 MG) BY MOUTH DAILY  . spironolactone (ALDACTONE) 25 MG tablet Take 0.5 tablets (12.5 mg total) by mouth daily.  . TOVIAZ 8 MG TB24 tablet TAKE 1 TABLET BY MOUTH DAILY  . vitamin B-12 (CYANOCOBALAMIN) 1000 MCG tablet Take 1,000 mcg by mouth at bedtime.     Allergies:   Propranolol and Brexpiprazole   Social History   Social History  . Marital status: Married    Spouse name: Dr. Lyla Son  . Number of children: 2  . Years of education: N/A   Occupational History  . housewife Unemployed   Social History Main Topics  . Smoking status: Former Smoker    Packs/day: 2.00    Years: 15.00    Types: Cigarettes    Quit date: 01/13/1971  . Smokeless tobacco: Never Used  . Alcohol use 1.8 oz/week    3 Glasses of wine per week     Comment: wine, three times per week  . Drug use: No  . Sexual activity: No     Comment: intercourse age 21, sexual  partners less than 5   Other Topics Concern  . None   Social History Narrative  . None     Family History: The patient's family history includes Breast cancer in her paternal aunt and paternal grandmother; Heart attack (age of onset: 57) in her father; Heart disease in her father; Hypertension in her father. There is no history of Colon cancer. ROS:  Please see the history of present illness.     All other systems reviewed and are negative.  EKGs/Labs/Other Studies Reviewed:    The following studies were reviewed today:   EKG:  EKG is  ordered today.  The ekg ordered today demonstrates  NSR at 78.  Poor R wave progression   Recent Labs: 09/02/2016: TSH 2.68 11/16/2016: ALT 38 01/25/2017: BUN 19; Creatinine, Ser 1.07 03/09/2017: Platelets 222 03/11/2017: Hemoglobin 10.9; Potassium 4.4; Sodium 134  Recent Lipid Panel    Component Value Date/Time   CHOL 239 (H) 07/23/2016 1159   TRIG 73 07/23/2016 1159   HDL 165 07/23/2016 1159   CHOLHDL 1.4 07/23/2016 1159   VLDL 15 07/23/2016 1159   LDLCALC 59 07/23/2016 1159   LDLDIRECT 98.2 04/10/2007 1150    Physical Exam:    VS:  BP (!) 88/44   Pulse 78   Ht 4' 10.5" (1.486 m)   Wt 108 lb 12.8 oz (49.4 kg)   LMP  (LMP Unknown)   BMI 22.35 kg/m     Wt Readings from Last 3 Encounters:  04/20/17 108 lb 12.8 oz (49.4 kg)  03/23/17 110 lb (49.9 kg)  03/11/17 109 lb (49.4 kg)     GEN: thin chronically ill appearing female,  Flat affetct  HEENT: Normal NECK: No JVD;  Right carotid bruit  LYMPHATICS: No lymphadenopathy CARDIAC: RR, no murmurs, rubs, gallops RESPIRATORY:  Clear to auscultation without rales, wheezing or rhonchi  ABDOMEN: Soft, non-tender, non-distended MUSCULOSKELETAL:  No edema; No deformity  SKIN: Warm and dry NEUROLOGIC:  Alert and oriented x 3 PSYCHIATRIC:  Flat  affect   ASSESSMENT:    No diagnosis found. PLAN:    In order of problems listed above:  1. Chest discomfort/indigestion: Her symptoms are  very atypical. She had a lot of indigestion pain for the past several days. She's had a stress Myoview study several years ago which was normal. Her EKG is unremarkable. At this point do not think that she's having coronary ischemia. We'll check a troponin level.  2. Hypotension: Her blood pressure is 85/45 on recheck. She's on high-dose amlodipine. She is also on Aldactone and Avapro. We will hold these medications. Her appetite has been relatively poor recently. I have encouraged her to eat better into and to add  a little bit of salt to her diet. We will need to see her in 2 days and decide on restarting medications.   Medication Adjustments/Labs and Tests Ordered: Current medicines are reviewed at length with the patient today.  Concerns regarding medicines are outlined above.  No orders of the defined types were placed in this encounter.  No orders of the defined types were placed in this encounter.   Signed, Mertie Moores, MD  04/20/2017 11:27 AM    [place]

## 2017-04-21 ENCOUNTER — Telehealth: Payer: Self-pay | Admitting: *Deleted

## 2017-04-21 LAB — COMPREHENSIVE METABOLIC PANEL
ALBUMIN: 3.9 g/dL (ref 3.5–4.8)
ALT: 23 IU/L (ref 0–32)
AST: 25 IU/L (ref 0–40)
Albumin/Globulin Ratio: 1.7 (ref 1.2–2.2)
Alkaline Phosphatase: 42 IU/L (ref 39–117)
BUN / CREAT RATIO: 30 — AB (ref 12–28)
BUN: 24 mg/dL (ref 8–27)
Bilirubin Total: <0.2 mg/dL (ref 0.0–1.2)
CALCIUM: 9.9 mg/dL (ref 8.7–10.3)
CO2: 22 mmol/L (ref 20–29)
CREATININE: 0.81 mg/dL (ref 0.57–1.00)
Chloride: 99 mmol/L (ref 96–106)
GFR calc Af Amer: 81 mL/min/{1.73_m2} (ref >59)
GFR, EST NON AFRICAN AMERICAN: 70 mL/min/{1.73_m2} (ref >59)
GLOBULIN, TOTAL: 2.3 g/dL (ref 1.5–4.5)
Glucose: 88 mg/dL (ref 65–99)
Potassium: 4.9 mmol/L (ref 3.5–5.2)
SODIUM: 136 mmol/L (ref 134–144)
Total Protein: 6.2 g/dL (ref 6.0–8.5)

## 2017-04-21 LAB — CBC WITH DIFFERENTIAL/PLATELET
Basophils Absolute: 0 10*3/uL (ref 0.0–0.2)
Basos: 0 %
EOS (ABSOLUTE): 0.3 10*3/uL (ref 0.0–0.4)
EOS: 4 %
HEMATOCRIT: 29.6 % — AB (ref 34.0–46.6)
HEMOGLOBIN: 10.2 g/dL — AB (ref 11.1–15.9)
IMMATURE GRANULOCYTES: 0 %
Immature Grans (Abs): 0 10*3/uL (ref 0.0–0.1)
LYMPHS ABS: 1.5 10*3/uL (ref 0.7–3.1)
Lymphs: 19 %
MCH: 30.6 pg (ref 26.6–33.0)
MCHC: 34.5 g/dL (ref 31.5–35.7)
MCV: 89 fL (ref 79–97)
Monocytes Absolute: 0.6 10*3/uL (ref 0.1–0.9)
Monocytes: 8 %
Neutrophils Absolute: 5.5 10*3/uL (ref 1.4–7.0)
Neutrophils: 69 %
Platelets: 385 10*3/uL — ABNORMAL HIGH (ref 150–379)
RBC: 3.33 x10E6/uL — AB (ref 3.77–5.28)
RDW: 13.9 % (ref 12.3–15.4)
WBC: 7.9 10*3/uL (ref 3.4–10.8)

## 2017-04-21 MED ORDER — OMEPRAZOLE-SODIUM BICARBONATE 40-1100 MG PO CAPS: 1.0000 | ORAL_CAPSULE | Freq: Two times a day (BID) | ORAL | 3 refills | Status: DC

## 2017-04-21 NOTE — Telephone Encounter (Signed)
rx sent [name] notified

## 2017-04-21 NOTE — Progress Notes (Signed)
Thank you :)

## 2017-04-21 NOTE — Telephone Encounter (Signed)
Prior authorization done today for Zegerid for patient, waiting on response

## 2017-04-22 ENCOUNTER — Ambulatory Visit: Payer: Medicare Other | Admitting: Internal Medicine

## 2017-04-22 DIAGNOSIS — F332 Major depressive disorder, recurrent severe without psychotic features: Secondary | ICD-10-CM | POA: Diagnosis not present

## 2017-04-22 DIAGNOSIS — I4581 Long QT syndrome: Secondary | ICD-10-CM | POA: Diagnosis not present

## 2017-04-22 DIAGNOSIS — I491 Atrial premature depolarization: Secondary | ICD-10-CM | POA: Diagnosis not present

## 2017-04-22 NOTE — Progress Notes (Deleted)
Follow-up Outpatient Visit Date: 04/22/2017  Primary Care Provider: [name], MD [id number] [place] [address]  Chief Complaint: Follow-up chest pain, confusion, and low blood pressure  HPI:  Meghan Oliver is a 76 y.o. year-old female with history of hypertension, hyperlipidemia, GERD with large hiatal hernia, and bipolar disorder, who presents for follow-up of low blood pressure. She was seen in our office by Dr. Acie Fredrickson 2 days ago which time she complained of chest discomfort. Her husband had also noted confusion for over a week. Blood pressure was found to be low at 88/44 (85/45 on recheck). Chest discomfort was felt to be atypical for coronary disease. Troponin T was negative. Other labs were notable for normal renal function and electrolytes, as well as modest anemia with hemoglobin having been gradually declining over the last year.  --------------------------------------------------------------------------------------------------  Cardiovascular History & Procedures: Cardiovascular Problems:  Atypical chest pain  Risk Factors:  Hypertension, hyperlipidemia, and age > 53  Cath/PCI:  ***  CV Surgery:  ***  EP Procedures and Devices:  ***  Non-Invasive Evaluation(s):  Pharmacologic MPI (01/30/15): Low risk study without ischemia or scar. LVEF 76% with normal wall motion.  TTE (09/05/13): Normal LV size with mild LVH. LVEF 65-70% with normal wall motion and grade 1 diastolic dysfunction. Very mild aortic stenosis and mild aortic regurgitation noted. Mild to moderate left atrial enlargement. Normal RV size and function. Mild to moderate pulmonary hypertension.  Recent CV Pertinent Labs: Lab Results  Component Value Date   CHOL 239 (H) 07/23/2016   HDL 165 07/23/2016   LDLCALC 59 07/23/2016   LDLDIRECT 98.2 04/10/2007   TRIG 73 07/23/2016   CHOLHDL 1.4 07/23/2016   K 4.9 04/20/2017   MG 1.9 01/07/2009   BUN 24 04/20/2017   CREATININE 0.81  04/20/2017   CREATININE 0.99 (H) 11/16/2016    Past medical and surgical history were reviewed and updated in EPIC.  No outpatient prescriptions have been marked as taking for the 04/22/17 encounter (Appointment) with Shedrick Sarli, Harrell Gave, MD.    Allergies: Propranolol and Brexpiprazole  Social History   Social History  . Marital status: Married    Spouse name: Dr. Lyla Son  . Number of children: 2  . Years of education: N/A   Occupational History  . housewife Unemployed   Social History Main Topics  . Smoking status: Former Smoker    Packs/day: 2.00    Years: 15.00    Types: Cigarettes    Quit date: 01/13/1971  . Smokeless tobacco: Never Used  . Alcohol use 1.8 oz/week    3 Glasses of wine per week     Comment: wine, three times per week  . Drug use: No  . Sexual activity: No     Comment: intercourse age 71, sexual partners less than 5   Other Topics Concern  . Not on file   Social History Narrative  . No narrative on file    Family History  Problem Relation Age of Onset  . Heart attack Father 89       deceased  . Hypertension Father   . Heart disease Father   . Breast cancer Paternal Grandmother        Age unknown  . Breast cancer Paternal Aunt        Age 12's  . Colon cancer Neg Hx     Review of Systems: A 12-system review of systems was performed and was negative except as noted in the HPI.  --------------------------------------------------------------------------------------------------  Physical Exam: LMP  (LMP Unknown)   General:  *** HEENT: No conjunctival pallor or scleral icterus. Moist mucous membranes.  OP clear. Neck: Supple without lymphadenopathy, thyromegaly, JVD, or HJR. No carotid bruit. Lungs: Normal work of breathing. Clear to auscultation bilaterally without wheezes or crackles. Heart: Regular rate and rhythm without murmurs, rubs, or gallops. Non-displaced PMI. Abd: Bowel sounds present. Soft, NT/ND without  hepatosplenomegaly Ext: No lower extremity edema. Radial, PT, and DP pulses are 2+ bilaterally. Skin: Warm and dry without rash.  EKG:  ***  Lab Results  Component Value Date   WBC 7.9 04/20/2017   HGB 10.2 (L) 04/20/2017   HCT 29.6 (L) 04/20/2017   MCV 89 04/20/2017   PLT 385 (H) 04/20/2017    Lab Results  Component Value Date   NA 136 04/20/2017   K 4.9 04/20/2017   CL 99 04/20/2017   CO2 22 04/20/2017   BUN 24 04/20/2017   CREATININE 0.81 04/20/2017   GLUCOSE 88 04/20/2017   ALT 23 04/20/2017    Lab Results  Component Value Date   CHOL 239 (H) 07/23/2016   HDL 165 07/23/2016   LDLCALC 59 07/23/2016   LDLDIRECT 98.2 04/10/2007   TRIG 73 07/23/2016   CHOLHDL 1.4 07/23/2016    --------------------------------------------------------------------------------------------------  ASSESSMENT AND PLAN: Harrell Gave Drucilla Cumber, MD 04/22/2017 8:06 AM

## 2017-04-22 NOTE — Telephone Encounter (Signed)
[employer] called to state medication Zegerid was approved for one year.

## 2017-04-25 DIAGNOSIS — F332 Major depressive disorder, recurrent severe without psychotic features: Secondary | ICD-10-CM | POA: Diagnosis not present

## 2017-04-25 DIAGNOSIS — R569 Unspecified convulsions: Secondary | ICD-10-CM | POA: Diagnosis not present

## 2017-04-29 DIAGNOSIS — M47816 Spondylosis without myelopathy or radiculopathy, lumbar region: Secondary | ICD-10-CM | POA: Diagnosis not present

## 2017-04-29 DIAGNOSIS — M5416 Radiculopathy, lumbar region: Secondary | ICD-10-CM | POA: Diagnosis not present

## 2017-04-29 DIAGNOSIS — M48062 Spinal stenosis, lumbar region with neurogenic claudication: Secondary | ICD-10-CM | POA: Diagnosis not present

## 2017-04-29 DIAGNOSIS — M4316 Spondylolisthesis, lumbar region: Secondary | ICD-10-CM | POA: Diagnosis not present

## 2017-04-29 DIAGNOSIS — F332 Major depressive disorder, recurrent severe without psychotic features: Secondary | ICD-10-CM | POA: Diagnosis not present

## 2017-04-29 DIAGNOSIS — M5136 Other intervertebral disc degeneration, lumbar region: Secondary | ICD-10-CM | POA: Diagnosis not present

## 2017-05-03 DIAGNOSIS — M545 Low back pain: Secondary | ICD-10-CM | POA: Diagnosis not present

## 2017-05-03 DIAGNOSIS — M5416 Radiculopathy, lumbar region: Secondary | ICD-10-CM | POA: Diagnosis not present

## 2017-05-04 DIAGNOSIS — F332 Major depressive disorder, recurrent severe without psychotic features: Secondary | ICD-10-CM | POA: Diagnosis not present

## 2017-05-06 ENCOUNTER — Other Ambulatory Visit: Payer: Self-pay

## 2017-05-06 ENCOUNTER — Emergency Department (HOSPITAL_COMMUNITY): Payer: Medicare Other

## 2017-05-06 ENCOUNTER — Telehealth: Payer: Self-pay | Admitting: Gastroenterology

## 2017-05-06 ENCOUNTER — Encounter (HOSPITAL_COMMUNITY): Payer: Self-pay | Admitting: Nurse Practitioner

## 2017-05-06 ENCOUNTER — Emergency Department (HOSPITAL_COMMUNITY)
Admission: EM | Admit: 2017-05-06 | Discharge: 2017-05-06 | Disposition: A | Discharge status: Home / Self Care | Payer: Medicare Other | Attending: Emergency Medicine | Admitting: Emergency Medicine

## 2017-05-06 DIAGNOSIS — R633 Feeding difficulties, unspecified: Secondary | ICD-10-CM

## 2017-05-06 DIAGNOSIS — R0789 Other chest pain: Secondary | ICD-10-CM | POA: Diagnosis not present

## 2017-05-06 DIAGNOSIS — N182 Chronic kidney disease, stage 2 (mild): Secondary | ICD-10-CM | POA: Diagnosis not present

## 2017-05-06 DIAGNOSIS — Z87891 Personal history of nicotine dependence: Secondary | ICD-10-CM | POA: Insufficient documentation

## 2017-05-06 DIAGNOSIS — E039 Hypothyroidism, unspecified: Secondary | ICD-10-CM | POA: Diagnosis not present

## 2017-05-06 DIAGNOSIS — Z79899 Other long term (current) drug therapy: Secondary | ICD-10-CM | POA: Diagnosis not present

## 2017-05-06 DIAGNOSIS — I129 Hypertensive chronic kidney disease with stage 1 through stage 4 chronic kidney disease, or unspecified chronic kidney disease: Secondary | ICD-10-CM | POA: Insufficient documentation

## 2017-05-06 DIAGNOSIS — R079 Chest pain, unspecified: Secondary | ICD-10-CM | POA: Diagnosis not present

## 2017-05-06 DIAGNOSIS — I2510 Atherosclerotic heart disease of native coronary artery without angina pectoris: Secondary | ICD-10-CM | POA: Insufficient documentation

## 2017-05-06 LAB — BASIC METABOLIC PANEL
ANION GAP: 8 (ref 5–15)
BUN: 25 mg/dL — ABNORMAL HIGH (ref 6–20)
CALCIUM: 10.2 mg/dL (ref 8.9–10.3)
CO2: 26 mmol/L (ref 22–32)
Chloride: 101 mmol/L (ref 101–111)
Creatinine, Ser: 1.02 mg/dL — ABNORMAL HIGH (ref 0.44–1.00)
GFR, EST AFRICAN AMERICAN: 60 mL/min — AB (ref >60)
GFR, EST NON AFRICAN AMERICAN: 52 mL/min — AB (ref >60)
Glucose, Bld: 85 mg/dL (ref 65–99)
Potassium: 4.4 mmol/L (ref 3.5–5.1)
Sodium: 135 mmol/L (ref 135–145)

## 2017-05-06 LAB — I-STAT TROPONIN, ED: Troponin i, poc: 0.01 ng/mL (ref 0.00–0.08)

## 2017-05-06 LAB — CBC
HCT: 35.1 % — ABNORMAL LOW (ref 36.0–46.0)
HEMOGLOBIN: 11.4 g/dL — AB (ref 12.0–15.0)
MCH: 29.5 pg (ref 26.0–34.0)
MCHC: 32.5 g/dL (ref 30.0–36.0)
MCV: 90.9 fL (ref 78.0–100.0)
PLATELETS: 284 10*3/uL (ref 150–400)
RBC: 3.86 MIL/uL — AB (ref 3.87–5.11)
RDW: 13.6 % (ref 11.5–15.5)
WBC: 8.3 10*3/uL (ref 4.0–10.5)

## 2017-05-06 MED ORDER — IOPAMIDOL (ISOVUE-370) INJECTION 76%
INTRAVENOUS | Status: AC
  Administered 2017-05-06: 80 mL
  Filled 2017-05-06: qty 100

## 2017-05-06 MED ORDER — METOPROLOL TARTRATE 5 MG/5ML IV SOLN
2.5000 mg | Freq: Once | INTRAVENOUS | Status: AC
  Administered 2017-05-06: 2.5 mg via INTRAVENOUS
  Filled 2017-05-06: qty 5

## 2017-05-06 MED ORDER — LINZESS 290 MCG PO CAPS: 290.0000 ug | ORAL_CAPSULE | Freq: Every day | ORAL | 3 refills | Status: DC

## 2017-05-06 MED ORDER — NITROGLYCERIN 0.4 MG SL SUBL
SUBLINGUAL_TABLET | SUBLINGUAL | Status: AC
  Administered 2017-05-06: 0.4 mg
  Filled 2017-05-06: qty 1

## 2017-05-06 MED ORDER — ACETAMINOPHEN 325 MG PO TABS
650.0000 mg | ORAL_TABLET | ORAL | Status: AC
  Administered 2017-05-06: 650 mg via ORAL
  Filled 2017-05-06: qty 2

## 2017-05-06 MED ORDER — NITROGLYCERIN 0.4 MG SL SUBL
SUBLINGUAL_TABLET | SUBLINGUAL | Status: AC
  Filled 2017-05-06: qty 1

## 2017-05-06 MED ORDER — NITROGLYCERIN 0.4 MG SL SUBL: 0.4000 mg | SUBLINGUAL_TABLET | Freq: Once | SUBLINGUAL | Status: DC

## 2017-05-06 NOTE — ED Triage Notes (Signed)
Pt presents with c/o chest pain. She's been having the pain for several months now and her doctors having been treating her with GERD medications with no improvement. Her pain has been worse this week. Her pain is midsternla with radiation into her neck. She denies any weakness, dizziness, diaphoresis. shortness of breath, She tried mylanta, zantac, PPI today with no relief.

## 2017-05-06 NOTE — ED Notes (Signed)
Pt transported to CT ?

## 2017-05-06 NOTE — ED Provider Notes (Signed)
The patient no longer has discomfort and wants to be discharged home. She had a CT of her chest that will be read by the cardiologist and the cardiologist will call results to her husband who is who is also a cardiologist and the patient will follow-up appropriately   Milton Ferguson, MD 05/06/17 1935

## 2017-05-06 NOTE — Telephone Encounter (Signed)
Medication Linzess 290 mcg transmitted. Message left with scheduler Cortney for [place]. She is to call me back.

## 2017-05-06 NOTE — Telephone Encounter (Signed)
Dr. Jefferson Fuel called to report that Mrs. Dao is having intermittent retrosternal chest pain radiating to the neck and jaw. He feels chest pain is exertional at times and she is also getting short of breath . She has seen cardiology and was not considered cardiac origin. She hasn't had cardiac stress test. Dr. Inocente Salles said he'll call back cardiology office to discuss further evaluation. She is currently on high-dose PPI (Zegrid) twice daily and Zantac at bedtime. She is taking Mylanta as needed with no improvement of her symptoms. She continues to have constipation, advised to increase Linzess to 290 g daily. Please send a prescription to her pharmacy Linzess 262mcg daily X 90 days with 3 refills. We'll obtain gastric emptying scan If cardiac workup negative for CAD, will consider repeat EGD with wireless bravo pH capsule to determine the degree of acid reflux Follow-up in office visit.

## 2017-05-06 NOTE — ED Notes (Signed)
This RN and Scientist, clinical (histocompatibility and immunogenetics) attempted IV stick x2 without success

## 2017-05-06 NOTE — ED Notes (Signed)
CT notified of pt iv access. Will call CT wen lopressor is administered and pt will be transported to ct.

## 2017-05-06 NOTE — Discharge Instructions (Signed)
Follow up with your md °

## 2017-05-06 NOTE — ED Provider Notes (Signed)
[facility] DEPT Provider Note   CSN: [id number] Arrival date & time: 05/06/17  1508     History   Chief Complaint Chief Complaint  Patient presents with  . Chest Pain    HPI Meghan Oliver is a 76 y.o. female.  She presents for evaluation of ongoing upper chest pain, both day and night, for 2 weeks.  She is taking her usual medicines and has recently been treated with more medications for esophagitis, without relief.  No prior coronary disease.  She has increased cardiac risk factors.  She recently had a CT done, recurrently, for depression.  She is here with her husband who gives most of the history.  There are no other known modifying factors.  HPI  Past Medical History:  Diagnosis Date  . Bipolar II disorder ([place])   . CKD (chronic kidney disease), stage II   . Esophagitis, erosive   . GAD (generalized anxiety disorder)   . GERD (gastroesophageal reflux disease)   . Heart murmur, systolic   . History of adenomatous polyp of colon    08-04-2016  tubular adenoma  . History of electroconvulsive therapy    at [facility]--  started 04-15-2015 to 11-17-2016  total greater than 40 times  . Hyperlipidemia   . Hypertension   . Hypothyroidism   . Internal carotid artery stenosis, bilateral    per last duplex 05-01-2014  bilateral ICA 40-59%  . Low back pain   . Major depression, chronic    ECT treatments extensive and multiple started 07/ 2016  . Migraines   . OSA (obstructive sleep apnea)    per study 06/ 2012 moderate OSA    . Osteoporosis   . Pulmonary nodule    monitored by pcp    Patient Active Problem List   Diagnosis Date Noted  . Hypotension 04/20/2017  . CAD (coronary artery disease) 01/05/2016  . Coronary artery calcification seen on CAT scan 06/25/2012  . Lung nodule 06/25/2012  . Hypothyroidism 03/21/2012  . Chronic cough 08/12/2011  . OSA (obstructive sleep apnea) 04/05/2011  . Essential hypertension 11/06/2010  . EDEMA 11/06/2010  . Chest pain  11/06/2010  . Hyperlipidemia 08/22/2007  . Bipolar disorder ([place]) 08/22/2007  . CAROTID ARTERY DISEASE 08/22/2007  . GERD 08/22/2007  . LOW BACK PAIN 08/22/2007  . OSTEOPOROSIS 08/22/2007  . INSOMNIA 08/22/2007  . SYSTOLIC MURMUR 40/34/7425    Past Surgical History:  Procedure Laterality Date  . ABDOMINAL SURGERY     Laparotomy Ovarian cystctomy  . APPENDECTOMY  1971  . BREAST ENHANCEMENT SURGERY Bilateral   . CARDIOVASCULAR STRESS TEST  01-30-2015  dr Aundra Dubin   Low risk nuclear study w/ no evidence ischemia or infarction/  normal LV funciton and wall motion , 76%  . COLONOSCOPY WITH ESOPHAGOGASTRODUODENOSCOPY (EGD)  last one 08-04-2016  . ESOPHAGOGASTRODUODENOSCOPY  02-26-04  . PORT-A-CATH PLACEMENT  05-31-2016   [facility]  . PORT-A-CATH REMOVAL N/A 03/11/2017   Procedure: REMOVAL PORT-A-CATH;  Surgeon: Jackolyn Confer, MD;  Location: [facility];  Service: General;  Laterality: N/A;  . TRANSTHORACIC ECHOCARDIOGRAM  09/05/2013  dr Aundra Dubin   mild LVH, ef 95-63%, grade 1 diastolic dysfunction/  very mild AV stenosis with mild AR/  trivial MR and PT/ mild to moderate LAE/ mild TR/ mild pulmonary hypertension with PA peak pressure 52mmHg  . TUBAL LIGATION      OB History    Gravida Para Term Preterm AB Living   2 2 2      2  SAB TAB Ectopic Multiple Live Births                   Home Medications    Prior to Admission medications   Medication Sig Start Date End Date Taking? Authorizing Provider  acetaminophen (TYLENOL) 500 MG tablet Take 1,000 mg by mouth every 6 (six) hours as needed for mild pain.   Yes [provider]  amLODipine (NORVASC) 10 MG tablet TAKE 1 TABLET(10 MG) BY MOUTH DAILY 11/29/16  Yes Baxley, Cresenciano Lick, MD  aspirin 81 MG tablet Take 81 mg by mouth daily.     Yes [provider]  Calcium Carbonate-Vitamin D (CALCIUM 600/VITAMIN D PO) Take 1 tablet by mouth 2 (two) times daily.    Yes [provider]  celecoxib (CELEBREX) 200 MG  capsule TAKE 1 CAPSULE BY MOUTH DAILY Patient taking differently: TAKE 200MG  BY MOUTH DAILY 11/29/16  Yes Baxley, Cresenciano Lick, MD  cetirizine (ZYRTEC) 10 MG tablet Take 10 mg by mouth 3 times/day as needed-between meals & bedtime.    Yes [provider]  Cholecalciferol (VITAMIN D3) 2000 UNITS TABS Take 4,000 Units by mouth daily.    Yes [provider]  irbesartan (AVAPRO) 150 MG tablet Take 150 mg by mouth daily.    Yes [provider]  L-Methylfolate-B12-B6-B2 (CEREFOLIN) 03-04-49-5 MG TABS Take 1 tablet by mouth at bedtime.   Yes [provider]  levothyroxine (SYNTHROID, LEVOTHROID) 75 MCG tablet TAKE 1 TABLET(75 MCG) BY MOUTH DAILY 03/01/17  Yes Baxley, Cresenciano Lick, MD  LINZESS 290 MCG CAPS capsule Take 1 capsule (290 mcg total) by mouth daily before breakfast. 05/06/17  Yes Nandigam, Kavitha V, MD  LITHIUM PO Take 150 mg by mouth at bedtime. Takes every three days at [facility]   Yes [provider]  LORazepam (ATIVAN) 0.5 MG tablet Take 0.5 mg by mouth 3 times/day as needed-between meals & bedtime for anxiety.  03/31/14  Yes [provider]  memantine (NAMENDA) 10 MG tablet Take 10 mg by mouth 2 (two) times daily.   Yes [provider]  Multiple Vitamins-Minerals (MULTIVITAMIN WITH MINERALS) tablet Take 1 tablet by mouth daily.     Yes [provider]  nortriptyline (PAMELOR) 10 MG capsule Take 10 mg by mouth at bedtime.   Yes [provider]  omeprazole-sodium bicarbonate (ZEGERID) 40-1100 MG capsule Take 1 capsule by mouth 2 (two) times daily. 04/21/17  Yes Nandigam, Venia Minks, MD  pantoprazole (PROTONIX) 40 MG tablet Take 1 tablet (40 mg total) by mouth 2 (two) times daily before a meal. 03/23/17  Yes Nandigam, Kavitha V, MD  PARoxetine (PAXIL) 20 MG tablet Take by mouth at bedtime.    Yes [provider]  primidone (MYSOLINE) 50 MG tablet 2 tabs AM and 1 tab QHS 03/25/17  Yes Baxley, Cresenciano Lick, MD  RA KRILL OIL 500 MG CAPS Take  500 mg by mouth daily.    Yes [provider]  ranitidine (ZANTAC) 150 MG tablet Take 1 tablet (150 mg total) by mouth at bedtime. 02/23/17  Yes Nandigam, Venia Minks, MD  rosuvastatin (CRESTOR) 20 MG tablet TAKE 1 TABLET(20 MG) BY MOUTH DAILY 11/29/16  Yes Baxley, Cresenciano Lick, MD  spironolactone (ALDACTONE) 25 MG tablet Take 0.5 tablets (12.5 mg total) by mouth daily. 12/02/16  Yes Larey Dresser, MD  TOVIAZ 8 MG TB24 tablet TAKE 1 TABLET BY MOUTH DAILY Patient taking differently: TAKE 8MG  BY MOUTH DAILY 08/28/16  Yes Baxley, Cresenciano Lick,  MD  vitamin B-12 (CYANOCOBALAMIN) 1000 MCG tablet Take 1,000 mcg by mouth at bedtime.   Yes [provider]    Family History Family History  Problem Relation Age of Onset  . Heart attack Father 78       deceased  . Hypertension Father   . Heart disease Father   . Breast cancer Paternal Grandmother        Age unknown  . Breast cancer Paternal Aunt        Age 11's  . Colon cancer Neg Hx     Social History Social History  Substance Use Topics  . Smoking status: Former Smoker    Packs/day: 2.00    Years: 15.00    Types: Cigarettes    Quit date: 01/13/1971  . Smokeless tobacco: Never Used  . Alcohol use 1.8 oz/week    3 Glasses of wine per week     Comment: wine, three times per week     Allergies   Propranolol and Brexpiprazole   Review of Systems Review of Systems  All other systems reviewed and are negative.    Physical Exam Updated Vital Signs BP (!) 144/58   Pulse 64   Resp 14   LMP  (LMP Unknown)   SpO2 95%   Physical Exam  Constitutional: She is oriented to person, place, and time. She appears well-developed.  Elderly, frail  HENT:  Head: Normocephalic and atraumatic.  Eyes: Pupils are equal, round, and reactive to light. Conjunctivae and EOM are normal.  Neck: Normal range of motion and phonation normal. Neck supple.  Cardiovascular: Normal rate, regular rhythm and normal heart sounds.   No murmur  heard. Pulmonary/Chest: Effort normal and breath sounds normal. She exhibits no tenderness.  Abdominal: Soft. She exhibits no distension. There is no tenderness. There is no guarding.  Musculoskeletal: Normal range of motion. She exhibits no edema, tenderness or deformity.  Neurological: She is alert and oriented to person, place, and time. She exhibits normal muscle tone.  Skin: Skin is warm and dry.  Psychiatric: She has a normal mood and affect. Her behavior is normal. Judgment and thought content normal.  Nursing note and vitals reviewed.    ED Treatments / Results  Labs (all labs ordered are listed, but only abnormal results are displayed) Labs Reviewed  BASIC METABOLIC PANEL - Abnormal; Notable for the following:       Result Value   BUN 25 (*)    Creatinine, Ser 1.02 (*)    GFR calc non Af Amer 52 (*)    GFR calc Af Amer 60 (*)    All other components within normal limits  CBC - Abnormal; Notable for the following:    RBC 3.86 (*)    Hemoglobin 11.4 (*)    HCT 35.1 (*)    All other components within normal limits  I-STAT TROPONIN, ED    EKG  EKG Interpretation  Date/Time:  Friday May 06 2017 15:16:44 EDT Ventricular Rate:  69 PR Interval:  152 QRS Duration: 82 QT Interval:  424 QTC Calculation: 454 R Axis:   79 Text Interpretation:  Normal sinus rhythm Low voltage QRS Septal infarct , age undetermined Abnormal ECG Baseline wander since last tracing no significant change Confirmed by Daleen Bo [phone]) on 05/06/2017 3:21:59 PM       Radiology Dg Chest 2 View  Result Date: 05/06/2017 CLINICAL DATA:  76 year old female with chest pain EXAM: CHEST  2 VIEW COMPARISON:  Prior chest x-ray 03/04/2014  FINDINGS: The cardiac and mediastinal contours are within normal limits. Atherosclerotic calcifications are present in the transverse aorta. No focal airspace opacity, pulmonary edema, pleural effusion or pneumothorax. Multiple healed right-sided rib fractures.  Partially calcified left breast reconstruction prosthesis. Chronic bronchitic changes appear similar. No acute osseous abnormality. IMPRESSION: 1. No acute cardiopulmonary process. 2.  Aortic Atherosclerosis (ICD10-170.0). Electronically Signed   By: Jacqulynn Cadet M.D.   On: 05/06/2017 16:03   Ct Coronary Morph W/cta Cor W/score W/ca W/cm &/or Wo/cm  Result Date: 05/06/2017 CLINICAL DATA:  Chest pain EXAM: Cardiac CTA MEDICATIONS: Sub lingual nitro .4mg  and lopressor 2.5mg  IV TECHNIQUE: The patient was scanned on a Philips 465 slice scanner. Gantry rotation speed was 270 msecs. Collimation was .58mm. A 100 kV prospective scan was triggered in the descending thoracic aorta at 111 HU's with 5% padding centered around 78% of the R-R interval. Average HR during the scan was 65 bpm. The 3D data set was interpreted on a dedicated work station using MPR, MIP and VRT modes. A total of 80cc of contrast was used. FINDINGS: Non-cardiac: See separate report from [facility]. Calcium Score: Unable to obtain accurate calcium score, cannot separate out aortic valve calcification. Coronary Arteries: Right dominant with no anomalies LM:  Calcified plaque ostial left main, mild stenosis. LAD system: Extensive calcified plaque in the proximal to mid LAD. Cannot rule out up to 50% stenosis in the mid LAD but there does not appear to be severe stenosis. Large D1 with prominent calcified plaque in the proximal and mid vessel, evaluation is limited by artifact but does not appear to have severe stenosis. Circumflex system: Moderate ramus with proximal calcified plaque, mild stenosis. AV LCx had calcified plaque proximally. Interpretation of this vessel limited distally by artifact but suspect no significant stenosis. RCA:  Calcified plaque in the mid RCA with minimal stenosis. IMPRESSION: 1.  Artifact makes interpretation of the LAD and LCx difficult. 2. The RCA does not appear to have significant stenosis (no more than  mild plaque in the mid vessel). 3. There is extensive calcified plaque involving the left main, proximal-mid LAD, large D1, and ramus. 4. The LAD does not appear to have more than 50% stenosis in the mid vessel. D1 is difficult to interpret due to artifact, but I do not think that there is significant stenosis. 5.  Ramus does not appear to have more than mild stenosis. 6. The AV LCx is difficult to interpret due to artifact. I do not think it has significant stenosis. This study will be sent for FFR. Dalton Mclean Electronically Signed   By: Loralie Champagne M.D.   On: 05/06/2017 21:55    Procedures Procedures (including critical care time)  Medications Ordered in ED Medications  iopamidol (ISOVUE-370) 76 % injection (80 mLs  Contrast Given 05/06/17 1826)  metoprolol tartrate (LOPRESSOR) injection 2.5 mg (2.5 mg Intravenous Given 05/06/17 1801)  nitroGLYCERIN (NITROSTAT) 0.4 MG SL tablet (0.4 mg  Given 05/06/17 1839)  acetaminophen (TYLENOL) tablet 650 mg (650 mg Oral Given 05/06/17 1934)     Initial Impression / Assessment and Plan / ED Course  I have reviewed the triage vital signs and the nursing notes.  Pertinent labs & imaging results that were available during my care of the patient were reviewed by me and considered in my medical decision making (see chart for details).      No data found.   CT chest coronary morphology ordered, reading will be done by Dr. Loralie Champagne.  Final Clinical Impressions(s) / ED Diagnoses   Final diagnoses:  Atypical chest pain   Subacute Chest pain, with increased cardiac risk factors. Initial troponin negative. Doubt ACS, PE or pneumonia. Patient is being risk stratified with CT angiogram, the coronary arteries. She received Lopressor, and nitroglycerin as part of the CT protocol. She plans on outpatient follow-up with cardiology.  Nursing Notes Reviewed/ Care Coordinated Applicable Imaging Reviewed Interpretation of Laboratory Data incorporated into ED  treatment   Plan- likely DC home, following CT imaging, to follow-up as an outpatient.   New Prescriptions Discharge Medication List as of 05/06/2017  7:34 PM    START taking these medications   Details  LINZESS 290 MCG CAPS capsule Take 1 capsule (290 mcg total) by mouth daily before breakfast., Starting Fri 05/06/2017, Normal         Daleen Bo, MD 05/08/17 [phone]

## 2017-05-06 NOTE — ED Notes (Signed)
Patient transported to X-ray from Triage, transport notified to bring pt back to E40 when finished.

## 2017-05-10 ENCOUNTER — Encounter (HOSPITAL_COMMUNITY): Payer: Self-pay | Admitting: Cardiology

## 2017-05-10 ENCOUNTER — Ambulatory Visit (HOSPITAL_COMMUNITY)
Admission: RE | Admit: 2017-05-10 | Discharge: 2017-05-10 | Disposition: A | Discharge status: Home / Self Care | Payer: Medicare Other | Source: Ambulatory Visit | Attending: Cardiology | Admitting: Cardiology

## 2017-05-10 ENCOUNTER — Encounter (HOSPITAL_COMMUNITY): Payer: Self-pay | Admitting: *Deleted

## 2017-05-10 ENCOUNTER — Other Ambulatory Visit (HOSPITAL_COMMUNITY): Payer: Self-pay | Admitting: *Deleted

## 2017-05-10 VITALS — BP 120/56 | HR 67 | Wt 110.5 lb

## 2017-05-10 DIAGNOSIS — E782 Mixed hyperlipidemia: Secondary | ICD-10-CM | POA: Diagnosis not present

## 2017-05-10 DIAGNOSIS — R079 Chest pain, unspecified: Secondary | ICD-10-CM

## 2017-05-10 DIAGNOSIS — R001 Bradycardia, unspecified: Secondary | ICD-10-CM | POA: Insufficient documentation

## 2017-05-10 DIAGNOSIS — I10 Essential (primary) hypertension: Secondary | ICD-10-CM | POA: Insufficient documentation

## 2017-05-10 DIAGNOSIS — K219 Gastro-esophageal reflux disease without esophagitis: Secondary | ICD-10-CM | POA: Insufficient documentation

## 2017-05-10 DIAGNOSIS — I350 Nonrheumatic aortic (valve) stenosis: Secondary | ICD-10-CM | POA: Diagnosis not present

## 2017-05-10 DIAGNOSIS — M542 Cervicalgia: Secondary | ICD-10-CM | POA: Insufficient documentation

## 2017-05-10 DIAGNOSIS — I208 Other forms of angina pectoris: Secondary | ICD-10-CM | POA: Diagnosis not present

## 2017-05-10 DIAGNOSIS — R011 Cardiac murmur, unspecified: Secondary | ICD-10-CM | POA: Insufficient documentation

## 2017-05-10 DIAGNOSIS — F319 Bipolar disorder, unspecified: Secondary | ICD-10-CM | POA: Diagnosis not present

## 2017-05-10 DIAGNOSIS — E785 Hyperlipidemia, unspecified: Secondary | ICD-10-CM | POA: Insufficient documentation

## 2017-05-10 DIAGNOSIS — I358 Other nonrheumatic aortic valve disorders: Secondary | ICD-10-CM | POA: Insufficient documentation

## 2017-05-10 DIAGNOSIS — Z7982 Long term (current) use of aspirin: Secondary | ICD-10-CM | POA: Diagnosis not present

## 2017-05-10 DIAGNOSIS — I2510 Atherosclerotic heart disease of native coronary artery without angina pectoris: Secondary | ICD-10-CM | POA: Diagnosis not present

## 2017-05-10 DIAGNOSIS — Z79899 Other long term (current) drug therapy: Secondary | ICD-10-CM | POA: Insufficient documentation

## 2017-05-10 DIAGNOSIS — I6529 Occlusion and stenosis of unspecified carotid artery: Secondary | ICD-10-CM | POA: Diagnosis not present

## 2017-05-10 MED ORDER — NITROGLYCERIN 0.4 MG SL SUBL: 0.4000 mg | SUBLINGUAL_TABLET | SUBLINGUAL | 3 refills | Status: DC | PRN

## 2017-05-10 MED ORDER — ISOSORBIDE MONONITRATE ER 30 MG PO TB24: 30.0000 mg | ORAL_TABLET | Freq: Every day | ORAL | 6 refills | Status: DC

## 2017-05-10 NOTE — Patient Instructions (Signed)
Start Imdur 30 mg daily  Take nitroglycerin as needed for chest pain  Your physician has requested that you have an echocardiogram. Echocardiography is a painless test that uses sound waves to create images of your heart. It provides your doctor with information about the size and shape of your heart and how well your heart's chambers and valves are working. This procedure takes approximately one hour. There are no restrictions for this procedure.  Your physician has requested that you have a cardiac catheterization. Cardiac catheterization is used to diagnose and/or treat various heart conditions. Doctors may recommend this procedure for a number of different reasons. The most common reason is to evaluate chest pain. Chest pain can be a symptom of coronary artery disease (CAD), and cardiac catheterization can show whether plaque is narrowing or blocking your heart's arteries. This procedure is also used to evaluate the valves, as well as measure the blood flow and oxygen levels in different parts of your heart. For further information please visit [email or web address]. Please follow instruction sheet, as given.  Your physician recommends that you schedule a follow-up appointment in: 2 weeks

## 2017-05-11 ENCOUNTER — Ambulatory Visit (HOSPITAL_COMMUNITY)
Admission: RE | Admit: 2017-05-11 | Discharge: 2017-05-11 | Disposition: A | Discharge status: Home / Self Care | Payer: Medicare Other | Source: Ambulatory Visit | Attending: Internal Medicine | Admitting: Internal Medicine

## 2017-05-11 ENCOUNTER — Telehealth: Payer: Self-pay | Admitting: Gastroenterology

## 2017-05-11 DIAGNOSIS — I350 Nonrheumatic aortic (valve) stenosis: Secondary | ICD-10-CM | POA: Diagnosis not present

## 2017-05-11 DIAGNOSIS — I313 Pericardial effusion (noninflammatory): Secondary | ICD-10-CM | POA: Diagnosis not present

## 2017-05-11 DIAGNOSIS — I517 Cardiomegaly: Secondary | ICD-10-CM | POA: Insufficient documentation

## 2017-05-11 DIAGNOSIS — K219 Gastro-esophageal reflux disease without esophagitis: Secondary | ICD-10-CM | POA: Insufficient documentation

## 2017-05-11 DIAGNOSIS — G4730 Sleep apnea, unspecified: Secondary | ICD-10-CM | POA: Diagnosis not present

## 2017-05-11 DIAGNOSIS — I2510 Atherosclerotic heart disease of native coronary artery without angina pectoris: Secondary | ICD-10-CM | POA: Diagnosis not present

## 2017-05-11 DIAGNOSIS — N189 Chronic kidney disease, unspecified: Secondary | ICD-10-CM | POA: Diagnosis not present

## 2017-05-11 DIAGNOSIS — I340 Nonrheumatic mitral (valve) insufficiency: Secondary | ICD-10-CM | POA: Diagnosis not present

## 2017-05-11 DIAGNOSIS — I361 Nonrheumatic tricuspid (valve) insufficiency: Secondary | ICD-10-CM | POA: Insufficient documentation

## 2017-05-11 DIAGNOSIS — I352 Nonrheumatic aortic (valve) stenosis with insufficiency: Secondary | ICD-10-CM | POA: Insufficient documentation

## 2017-05-11 MED ORDER — LINZESS 290 MCG PO CAPS: 290.0000 ug | ORAL_CAPSULE | Freq: Every day | ORAL | 3 refills | Status: DC

## 2017-05-11 NOTE — Progress Notes (Signed)
  Echocardiogram 2D Echocardiogram has been performed.  Darlina Sicilian M 05/11/2017, 1:45 PM

## 2017-05-11 NOTE — Progress Notes (Signed)
Patient ID: Meghan Oliver, female   DOB: 1941/05/01, 76 y.o.   MRN: [id number] PCP: Dr. Renold Genta  76 yo with history of bipolar disorder, carotid stenosis, HTN and hyperlipidemia presents for cardiology followup.  Last echo in 12/14 showed normal EF, mild AI and very mild AS.  Cardiolite in 7/16 showed EF 76% with no ischemia/infarction.  She has severe depression and gets periodic ECT at [facility].   For about 3 years, she has had episodes of burning in her lower neck , sometimes radiating up.  These episodes have no particular trigger (not meals or exercise).  In the past, her family had thought they represented GERD and she has been on a PPI and H-2 blocker as well as Mylanta prn.  She will have some relief but not always complete when she takes Mylanta.  Over the last few months, she has had more of the lower neck burning episodes.  They can last up to an hour at a time and sometimes occur daily.  Last Friday, she had an episode where the pain ran up to her jaw.  She was not doing anything in particular when it started. She went to the ER.  Troponin was negative and ECG unchanged.  She had a coronary CTA that showed extensive calcification in the proximal to mid LAD, no definite severe stenosis but cannot rule out.  The study quality was not good enough to interpret by FFR.    Since that time, she has not had a severe episode of neck burning, she has just had her old pattern of periodic episodes (that have been going on for 3 years now).    She has baseline dyspnea walking up a flight of steps.  Her depression has been worse recently and she will need ECT again.  She has had problems recently with her memory.   ECG (personally reviewed): NSR, septal Qs (same as prior ECGs)  Labs (4/16): K 3.3, creatinine 0.76 Labs (7/18): K 4.4, creatinine 1.02  PMH: 1. GERD 2. Bipolar disorder: On lithium. History of ECT.  3. HTN 4. Tremor 5. Hyperlipidemia 6. Carotid stenosis: Carotid dopplers (7/14) with 40-59%  bilateral ICA stenosis.  Carotid dopplers (7/15) with 40-59% BICA stenosis.  7. CAD: Coronary calcium seen on CT.   - ETT (7/13) with 5'45" exercise, 71% MPHR (wanted to stop), no ischemic ECG changes but did not reach target heart rate.  - Lexiscan Cardiolite (4/16) with EF 76%, no ischemia/infarction.  - Coronary CTA (7/18): Interpretation limited by artifact.  Extensive calcification in the proximal to mid LAD, possible 50% stenosis but difficult to quantify due to artifact.   8. Aortic valve disorder:  Echo (2/12) with EF 55-60%, mild AI and MR, PA systolic pressure 36 mmHg.  Echo (12/14) with EF 65-70%, mild LVH, mild AI, very mild AS, PA systolic pressure 44 mmHg.  9. Lung nodules: CT chest 12/14 stable, no followup recommended.  10. Bradycardia: asymptomatic  SH: Married to [name], quit smoking in 1972, 2 daughters.  FH: Father with CAD.  ROS: All systems reviewed and negative except as per HPI.   Current Outpatient Prescriptions  Medication Sig Dispense Refill  . acetaminophen (TYLENOL) 500 MG tablet Take 1,000 mg by mouth every 6 (six) hours as needed for mild pain.    Marland Kitchen amLODipine (NORVASC) 10 MG tablet TAKE 1 TABLET(10 MG) BY MOUTH DAILY 90 tablet 3  . aspirin 81 MG tablet Take 81 mg by mouth daily.      Marland Kitchen  Calcium Carbonate-Vitamin D (CALCIUM 600/VITAMIN D PO) Take 1 tablet by mouth 2 (two) times daily.     . celecoxib (CELEBREX) 200 MG capsule TAKE 1 CAPSULE BY MOUTH DAILY 90 capsule 3  . cetirizine (ZYRTEC) 10 MG tablet Take 10 mg by mouth 3 times/day as needed-between meals & bedtime.     . Cholecalciferol (VITAMIN D3) 2000 UNITS TABS Take 4,000 Units by mouth daily.     . irbesartan (AVAPRO) 150 MG tablet Take 150 mg by mouth daily.     Marland Kitchen L-Methylfolate-B12-B6-B2 (CEREFOLIN) 03-04-49-5 MG TABS Take 1 tablet by mouth at bedtime.    Marland Kitchen levothyroxine (SYNTHROID, LEVOTHROID) 75 MCG tablet TAKE 1 TABLET(75 MCG) BY MOUTH DAILY 90 tablet 1  . LINZESS 290 MCG CAPS capsule Take 1  capsule (290 mcg total) by mouth daily before breakfast. 90 capsule 3  . LITHIUM PO Take 150 mg by mouth at bedtime. Takes every three days at HS    . LORazepam (ATIVAN) 0.5 MG tablet Take 0.5 mg by mouth 3 times/day as needed-between meals & bedtime for anxiety.     . memantine (NAMENDA) 10 MG tablet Take 10 mg by mouth 2 (two) times daily.    . Multiple Vitamins-Minerals (MULTIVITAMIN WITH MINERALS) tablet Take 1 tablet by mouth daily.      . nortriptyline (PAMELOR) 10 MG capsule Take 10 mg by mouth at bedtime.    Marland Kitchen omeprazole-sodium bicarbonate (ZEGERID) 40-1100 MG capsule Take 1 capsule by mouth 2 (two) times daily. 180 capsule 3  . pantoprazole (PROTONIX) 40 MG tablet Take 1 tablet (40 mg total) by mouth 2 (two) times daily before a meal. 60 tablet 3  . PARoxetine (PAXIL) 20 MG tablet Take by mouth at bedtime.     . primidone (MYSOLINE) 50 MG tablet 2 tabs AM and 1 tab QHS 270 tablet 1  . RA KRILL OIL 500 MG CAPS Take 500 mg by mouth daily.     . ranitidine (ZANTAC) 150 MG tablet Take 1 tablet (150 mg total) by mouth at bedtime. 30 tablet 3  . rosuvastatin (CRESTOR) 20 MG tablet TAKE 1 TABLET(20 MG) BY MOUTH DAILY 90 tablet 3  . spironolactone (ALDACTONE) 25 MG tablet Take 0.5 tablets (12.5 mg total) by mouth daily. 45 tablet 3  . TOVIAZ 8 MG TB24 tablet TAKE 1 TABLET BY MOUTH DAILY 90 tablet 3  . vitamin B-12 (CYANOCOBALAMIN) 1000 MCG tablet Take 1,000 mcg by mouth at bedtime.    . isosorbide mononitrate (IMDUR) 30 MG 24 hr tablet Take 1 tablet (30 mg total) by mouth daily. 30 tablet 6  . nitroGLYCERIN (NITROSTAT) 0.4 MG SL tablet Place 1 tablet (0.4 mg total) under the tongue every 5 (five) minutes as needed for chest pain. 25 tablet 3   No current facility-administered medications for this encounter.     BP (!) 120/56   Pulse 67   Wt 110 lb 8 oz (50.1 kg)   LMP  (LMP Unknown)   SpO2 100%   BMI 22.70 kg/m  General: NAD Neck: No JVD, no thyromegaly or thyroid nodule.  Lungs:  Clear to auscultation bilaterally with normal respiratory effort. CV: Nondisplaced PMI.  Heart regular S1/S2, no S3/S4, 2/6 early SEM RUSB.  No peripheral edema.  No carotid bruit.  Normal pedal pulses.  Abdomen: Soft, nontender, no hepatosplenomegaly, no distention.  Skin: Intact without lesions or rashes.  Neurologic: Alert and oriented x 3.  Psych: Flat affect. Extremities: No clubbing or cyanosis.  HEENT: Normal.  Assessment/Plan: 1. Carotid stenosis: Will need repeat carotid doppler evaluation, has not had since 2015.  2. CAD: Atypical pain (neck burning, nonexertional) for about 3 years but worse recently.  However, she has a concerning coronary CTA with extensive calcified plaque in the proximal to mid LAD. Due to artifact, hard to tell degree of stenosis and the study was not good enough to get FFR from it.  I am concerned that her symptoms could represent angina given the amount of coronary plaque she has.  ECG has septal Qs but this has been seen in the past.  - I will start her on Imdur 30 mg daily and give her sublingual NTG.  - Continue ASA 81 and statin.  - We discussed coronary catheterization.  As symptoms are worsening, I think this is the next step.  I told her and her husband the risks/benefits of the procedure and they agree to proceed.   3. Aortic valve disorder: Mild AI, very mild AS on echo in 12/14.  Murmur seems somewhat louder and there was extensive aortic valve calcification on the coronary CT.   - I will arrange for repeat echo to reassess AS.  4. Bradycardia: Mild, asymptomatic.  Avoid nodal blocking agents.   5. HTN: BP controlled on irbesartan, amlodipine, and spironolactone. Recent BMET with stable K. 6. Hyperlipidemia: Check lipids this week, goal LDL < 70 given carotid stenosis and CAD.   Followup in 2 wks after cath.   Loralie Champagne 05/11/2017

## 2017-05-11 NOTE — Progress Notes (Signed)
Many thanks. 

## 2017-05-12 ENCOUNTER — Telehealth (HOSPITAL_COMMUNITY): Payer: Self-pay | Admitting: *Deleted

## 2017-05-12 DIAGNOSIS — Z79899 Other long term (current) drug therapy: Secondary | ICD-10-CM | POA: Diagnosis not present

## 2017-05-12 DIAGNOSIS — F333 Major depressive disorder, recurrent, severe with psychotic symptoms: Secondary | ICD-10-CM | POA: Diagnosis not present

## 2017-05-12 DIAGNOSIS — I5022 Chronic systolic (congestive) heart failure: Secondary | ICD-10-CM | POA: Diagnosis not present

## 2017-05-12 DIAGNOSIS — I6523 Occlusion and stenosis of bilateral carotid arteries: Secondary | ICD-10-CM

## 2017-05-12 NOTE — Telephone Encounter (Signed)
-----   Message from Larey Dresser, MD sent at 05/12/2017 12:04 AM EDT ----- Normal EF.  Mild AS with moderate AI.  This will need to be followed over time. Please call patient. Also need to arrange for carotid dopplers to be done.

## 2017-05-12 NOTE — Telephone Encounter (Signed)
Notes recorded by Scarlette Calico, RN on 05/12/2017 at 4:10 PM EDT Pt's husband aware, order placed for carotids ------  Notes recorded by Larey Dresser, MD on 05/12/2017 at 12:04 AM EDT Normal EF. Mild AS with moderate AI. This will need to be followed over time. Please call patient. Also need to arrange for carotid dopplers to be done.

## 2017-05-13 ENCOUNTER — Telehealth: Payer: Self-pay | Admitting: Neurology

## 2017-05-13 NOTE — Telephone Encounter (Signed)
Called patient several times for [name] to move her appoint ment to a sooner date and either were told no or we got aa voice mail

## 2017-05-16 ENCOUNTER — Ambulatory Visit (HOSPITAL_COMMUNITY)
Admission: RE | Admit: 2017-05-16 | Discharge: 2017-05-17 | Disposition: A | Discharge status: Home / Self Care | Payer: Medicare Other | Source: Ambulatory Visit | Attending: Cardiology | Admitting: Cardiology

## 2017-05-16 ENCOUNTER — Encounter (HOSPITAL_COMMUNITY)
Admission: RE | Disposition: A | Discharge status: Home / Self Care | Payer: Self-pay | Source: Ambulatory Visit | Attending: Cardiology

## 2017-05-16 ENCOUNTER — Encounter (HOSPITAL_COMMUNITY): Payer: Self-pay | Admitting: General Practice

## 2017-05-16 DIAGNOSIS — I10 Essential (primary) hypertension: Secondary | ICD-10-CM | POA: Insufficient documentation

## 2017-05-16 DIAGNOSIS — X58XXXA Exposure to other specified factors, initial encounter: Secondary | ICD-10-CM | POA: Diagnosis not present

## 2017-05-16 DIAGNOSIS — I200 Unstable angina: Secondary | ICD-10-CM

## 2017-05-16 DIAGNOSIS — Z7982 Long term (current) use of aspirin: Secondary | ICD-10-CM | POA: Insufficient documentation

## 2017-05-16 DIAGNOSIS — F319 Bipolar disorder, unspecified: Secondary | ICD-10-CM | POA: Insufficient documentation

## 2017-05-16 DIAGNOSIS — R079 Chest pain, unspecified: Secondary | ICD-10-CM

## 2017-05-16 DIAGNOSIS — Z8249 Family history of ischemic heart disease and other diseases of the circulatory system: Secondary | ICD-10-CM | POA: Insufficient documentation

## 2017-05-16 DIAGNOSIS — Z955 Presence of coronary angioplasty implant and graft: Secondary | ICD-10-CM

## 2017-05-16 DIAGNOSIS — D649 Anemia, unspecified: Secondary | ICD-10-CM | POA: Diagnosis not present

## 2017-05-16 DIAGNOSIS — I358 Other nonrheumatic aortic valve disorders: Secondary | ICD-10-CM | POA: Insufficient documentation

## 2017-05-16 DIAGNOSIS — Z79899 Other long term (current) drug therapy: Secondary | ICD-10-CM | POA: Diagnosis not present

## 2017-05-16 DIAGNOSIS — I25110 Atherosclerotic heart disease of native coronary artery with unstable angina pectoris: Secondary | ICD-10-CM | POA: Diagnosis not present

## 2017-05-16 DIAGNOSIS — I6529 Occlusion and stenosis of unspecified carotid artery: Secondary | ICD-10-CM | POA: Insufficient documentation

## 2017-05-16 DIAGNOSIS — R001 Bradycardia, unspecified: Secondary | ICD-10-CM | POA: Diagnosis not present

## 2017-05-16 DIAGNOSIS — E785 Hyperlipidemia, unspecified: Secondary | ICD-10-CM | POA: Insufficient documentation

## 2017-05-16 DIAGNOSIS — I2584 Coronary atherosclerosis due to calcified coronary lesion: Secondary | ICD-10-CM | POA: Diagnosis not present

## 2017-05-16 DIAGNOSIS — S300XXA Contusion of lower back and pelvis, initial encounter: Secondary | ICD-10-CM | POA: Diagnosis not present

## 2017-05-16 HISTORY — PX: LEFT HEART CATH AND CORONARY ANGIOGRAPHY: CATH118249

## 2017-05-16 HISTORY — DX: Personal history of other diseases of the digestive system: Z87.19

## 2017-05-16 HISTORY — DX: Pain in right hip: M25.551

## 2017-05-16 HISTORY — DX: Unspecified osteoarthritis, unspecified site: M19.90

## 2017-05-16 HISTORY — PX: CORONARY ANGIOPLASTY WITH STENT PLACEMENT: SHX49

## 2017-05-16 HISTORY — DX: Other chronic pain: G89.29

## 2017-05-16 HISTORY — DX: Headache: R51

## 2017-05-16 HISTORY — DX: Other amnesia: R41.3

## 2017-05-16 HISTORY — DX: Unspecified chronic bronchitis: J42

## 2017-05-16 HISTORY — PX: CORONARY STENT INTERVENTION: CATH118234

## 2017-05-16 HISTORY — DX: Headache, unspecified: R51.9

## 2017-05-16 HISTORY — DX: Low back pain, unspecified: M54.50

## 2017-05-16 HISTORY — DX: Low back pain: M54.5

## 2017-05-16 LAB — POCT I-STAT 3, ART BLOOD GAS (G3+)
ACID-BASE DEFICIT: 1 mmol/L (ref 0.0–2.0)
BICARBONATE: 18.7 mmol/L — AB (ref 20.0–28.0)
O2 Saturation: 100 %
PH ART: 7.588 — AB (ref 7.350–7.450)
PO2 ART: 143 mmHg — AB (ref 83.0–108.0)
TCO2: 19 mmol/L (ref 0–100)
pCO2 arterial: 19.6 mmHg — CL (ref 32.0–48.0)

## 2017-05-16 LAB — PROTIME-INR
INR: 1.11
PROTHROMBIN TIME: 14.3 s (ref 11.4–15.2)

## 2017-05-16 LAB — POCT ACTIVATED CLOTTING TIME: Activated Clotting Time: 274 seconds

## 2017-05-16 SURGERY — LEFT HEART CATH AND CORONARY ANGIOGRAPHY
Anesthesia: LOCAL

## 2017-05-16 MED ORDER — CLOPIDOGREL BISULFATE 75 MG PO TABS
75.0000 mg | ORAL_TABLET | Freq: Every day | ORAL | Status: DC
  Administered 2017-05-17: 09:00:00 75 mg via ORAL
  Filled 2017-05-16: qty 1

## 2017-05-16 MED ORDER — NORTRIPTYLINE HCL 10 MG PO CAPS
10.0000 mg | ORAL_CAPSULE | Freq: Every day | ORAL | Status: DC
  Administered 2017-05-16: 10 mg via ORAL
  Filled 2017-05-16: qty 1

## 2017-05-16 MED ORDER — ACETAMINOPHEN 325 MG PO TABS
650.0000 mg | ORAL_TABLET | ORAL | Status: DC | PRN
  Administered 2017-05-16 – 2017-05-17 (×2): 650 mg via ORAL
  Filled 2017-05-16 (×2): qty 2

## 2017-05-16 MED ORDER — CLOPIDOGREL BISULFATE 300 MG PO TABS
ORAL_TABLET | ORAL | Status: DC | PRN
  Administered 2017-05-16: 600 mg via ORAL

## 2017-05-16 MED ORDER — AMLODIPINE BESYLATE 5 MG PO TABS
10.0000 mg | ORAL_TABLET | Freq: Every day | ORAL | Status: DC
  Administered 2017-05-17: 09:00:00 10 mg via ORAL
  Filled 2017-05-16: qty 2

## 2017-05-16 MED ORDER — ASPIRIN EC 81 MG PO TBEC
81.0000 mg | DELAYED_RELEASE_TABLET | Freq: Every day | ORAL | Status: DC
Start: 2017-05-17 — End: 2017-05-17
  Administered 2017-05-17: 81 mg via ORAL
  Filled 2017-05-16: qty 1

## 2017-05-16 MED ORDER — CLOPIDOGREL BISULFATE 300 MG PO TABS
ORAL_TABLET | ORAL | Status: AC
  Filled 2017-05-16: qty 2

## 2017-05-16 MED ORDER — SODIUM CHLORIDE 0.9 % IV SOLN: 250.0000 mL | INTRAVENOUS | Status: DC | PRN

## 2017-05-16 MED ORDER — IOPAMIDOL (ISOVUE-370) INJECTION 76%
INTRAVENOUS | Status: DC | PRN
Start: 2017-05-16 — End: 2017-05-16
  Administered 2017-05-16: 100 mL via INTRA_ARTERIAL
  Administered 2017-05-16: 55 mL via INTRA_ARTERIAL

## 2017-05-16 MED ORDER — ADULT MULTIVITAMIN W/MINERALS CH
1.0000 | ORAL_TABLET | Freq: Every day | ORAL | Status: DC
  Administered 2017-05-17: 09:00:00 1 via ORAL
  Filled 2017-05-16 (×2): qty 1

## 2017-05-16 MED ORDER — FENTANYL CITRATE (PF) 100 MCG/2ML IJ SOLN
INTRAMUSCULAR | Status: DC | PRN
  Administered 2017-05-16 (×3): 25 ug via INTRAVENOUS

## 2017-05-16 MED ORDER — SODIUM CHLORIDE 0.9 % IV SOLN
INTRAVENOUS | Status: AC
  Administered 2017-05-16: 16:00:00 via INTRAVENOUS

## 2017-05-16 MED ORDER — IRBESARTAN 150 MG PO TABS
150.0000 mg | ORAL_TABLET | Freq: Every day | ORAL | Status: DC
  Administered 2017-05-17: 09:00:00 150 mg via ORAL
  Filled 2017-05-16: qty 1

## 2017-05-16 MED ORDER — VERAPAMIL HCL 2.5 MG/ML IV SOLN
INTRAVENOUS | Status: DC | PRN
  Administered 2017-05-16: 10 mL via INTRA_ARTERIAL

## 2017-05-16 MED ORDER — VITAMIN B-12 1000 MCG PO TABS
1000.0000 ug | ORAL_TABLET | Freq: Every day | ORAL | Status: DC
  Administered 2017-05-16: 22:00:00 1000 ug via ORAL
  Filled 2017-05-16: qty 1

## 2017-05-16 MED ORDER — ONDANSETRON HCL 4 MG/2ML IJ SOLN: 4.0000 mg | Freq: Four times a day (QID) | INTRAMUSCULAR | Status: DC | PRN

## 2017-05-16 MED ORDER — HEPARIN (PORCINE) IN NACL 2-0.9 UNIT/ML-% IJ SOLN
INTRAMUSCULAR | Status: AC
  Filled 2017-05-16: qty 1000

## 2017-05-16 MED ORDER — VERAPAMIL HCL 2.5 MG/ML IV SOLN
INTRAVENOUS | Status: AC
  Filled 2017-05-16: qty 2

## 2017-05-16 MED ORDER — CEREFOLIN 6-1-50-5 MG PO TABS: 1.0000 | ORAL_TABLET | Freq: Every day | ORAL | Status: DC

## 2017-05-16 MED ORDER — LIDOCAINE HCL (PF) 1 % IJ SOLN
INTRAMUSCULAR | Status: DC | PRN
  Administered 2017-05-16: 2 mL via INTRADERMAL

## 2017-05-16 MED ORDER — HYDRALAZINE HCL 20 MG/ML IJ SOLN: 5.0000 mg | INTRAMUSCULAR | Status: AC | PRN

## 2017-05-16 MED ORDER — HEPARIN SODIUM (PORCINE) 1000 UNIT/ML IJ SOLN
INTRAMUSCULAR | Status: AC
  Filled 2017-05-16: qty 1

## 2017-05-16 MED ORDER — HEPARIN SODIUM (PORCINE) 1000 UNIT/ML IJ SOLN
INTRAMUSCULAR | Status: DC | PRN
  Administered 2017-05-16: 2500 [IU] via INTRAVENOUS
  Administered 2017-05-16: 6000 [IU] via INTRAVENOUS
  Administered 2017-05-16: 2000 [IU] via INTRAVENOUS

## 2017-05-16 MED ORDER — PANTOPRAZOLE SODIUM 40 MG PO TBEC
40.0000 mg | DELAYED_RELEASE_TABLET | Freq: Two times a day (BID) | ORAL | Status: DC
  Administered 2017-05-17: 40 mg via ORAL
  Filled 2017-05-16: qty 1

## 2017-05-16 MED ORDER — OMEGA-3-ACID ETHYL ESTERS 1 G PO CAPS
1.0000 g | ORAL_CAPSULE | Freq: Every day | ORAL | Status: DC
  Administered 2017-05-17: 09:00:00 1 g via ORAL
  Filled 2017-05-16: qty 1

## 2017-05-16 MED ORDER — LORATADINE 10 MG PO TABS: 10.0000 mg | ORAL_TABLET | Freq: Every day | ORAL | Status: DC | PRN

## 2017-05-16 MED ORDER — MIDAZOLAM HCL 2 MG/2ML IJ SOLN
INTRAMUSCULAR | Status: AC
  Filled 2017-05-16: qty 2

## 2017-05-16 MED ORDER — SODIUM CHLORIDE 0.9% FLUSH: 3.0000 mL | INTRAVENOUS | Status: DC | PRN

## 2017-05-16 MED ORDER — MIDAZOLAM HCL 2 MG/2ML IJ SOLN
INTRAMUSCULAR | Status: DC | PRN
  Administered 2017-05-16 (×4): 1 mg via INTRAVENOUS

## 2017-05-16 MED ORDER — NITROGLYCERIN 1 MG/10 ML FOR IR/CATH LAB
INTRA_ARTERIAL | Status: DC | PRN
  Administered 2017-05-16: 200 ug via INTRACORONARY

## 2017-05-16 MED ORDER — LABETALOL HCL 5 MG/ML IV SOLN: 10.0000 mg | INTRAVENOUS | Status: AC | PRN

## 2017-05-16 MED ORDER — VITAMIN D 1000 UNITS PO TABS
4000.0000 [IU] | ORAL_TABLET | Freq: Every day | ORAL | Status: DC
  Administered 2017-05-17: 4000 [IU] via ORAL
  Filled 2017-05-16: qty 4

## 2017-05-16 MED ORDER — MEMANTINE HCL 10 MG PO TABS
10.0000 mg | ORAL_TABLET | Freq: Two times a day (BID) | ORAL | Status: DC
  Administered 2017-05-16 – 2017-05-17 (×2): 10 mg via ORAL
  Filled 2017-05-16 (×2): qty 1

## 2017-05-16 MED ORDER — LEVOTHYROXINE SODIUM 75 MCG PO TABS
75.0000 ug | ORAL_TABLET | Freq: Every day | ORAL | Status: DC
  Administered 2017-05-17: 07:00:00 75 ug via ORAL
  Filled 2017-05-16: qty 1

## 2017-05-16 MED ORDER — PAROXETINE HCL 20 MG PO TABS
20.0000 mg | ORAL_TABLET | Freq: Every day | ORAL | Status: DC
  Administered 2017-05-16: 22:00:00 20 mg via ORAL
  Filled 2017-05-16: qty 1

## 2017-05-16 MED ORDER — SODIUM CHLORIDE 0.9% FLUSH
3.0000 mL | Freq: Two times a day (BID) | INTRAVENOUS | Status: DC
  Administered 2017-05-16 – 2017-05-17 (×2): 3 mL via INTRAVENOUS

## 2017-05-16 MED ORDER — CLOPIDOGREL BISULFATE 75 MG PO TABS: 600.0000 mg | ORAL_TABLET | Freq: Once | ORAL | Status: DC

## 2017-05-16 MED ORDER — PRIMIDONE 50 MG PO TABS
50.0000 mg | ORAL_TABLET | Freq: Every day | ORAL | Status: DC
  Administered 2017-05-16: 50 mg via ORAL
  Filled 2017-05-16: qty 1

## 2017-05-16 MED ORDER — SPIRONOLACTONE 12.5 MG HALF TABLET
12.5000 mg | ORAL_TABLET | Freq: Every day | ORAL | Status: DC
  Administered 2017-05-17: 09:00:00 12.5 mg via ORAL
  Filled 2017-05-16: qty 1

## 2017-05-16 MED ORDER — HEPARIN (PORCINE) IN NACL 2-0.9 UNIT/ML-% IJ SOLN
INTRAMUSCULAR | Status: AC | PRN
  Administered 2017-05-16: 1000 mL

## 2017-05-16 MED ORDER — RA KRILL OIL 500 MG PO CAPS: 500.0000 mg | ORAL_CAPSULE | Freq: Every day | ORAL | Status: DC

## 2017-05-16 MED ORDER — ENSURE ENLIVE PO LIQD
237.0000 mL | Freq: Two times a day (BID) | ORAL | Status: DC
  Administered 2017-05-17: 237 mL via ORAL
  Filled 2017-05-16 (×4): qty 237

## 2017-05-16 MED ORDER — NITROGLYCERIN 0.4 MG SL SUBL: 0.4000 mg | SUBLINGUAL_TABLET | SUBLINGUAL | Status: DC | PRN

## 2017-05-16 MED ORDER — FENTANYL CITRATE (PF) 100 MCG/2ML IJ SOLN
INTRAMUSCULAR | Status: AC
  Filled 2017-05-16: qty 2

## 2017-05-16 MED ORDER — PANTOPRAZOLE SODIUM 40 MG PO TBEC: 40.0000 mg | DELAYED_RELEASE_TABLET | Freq: Two times a day (BID) | ORAL | Status: DC

## 2017-05-16 MED ORDER — ASPIRIN 81 MG PO CHEW
CHEWABLE_TABLET | ORAL | Status: AC
  Administered 2017-05-16: 81 mg via ORAL
  Filled 2017-05-16: qty 1

## 2017-05-16 MED ORDER — LIDOCAINE HCL (PF) 1 % IJ SOLN
INTRAMUSCULAR | Status: AC
  Filled 2017-05-16: qty 30

## 2017-05-16 MED ORDER — ISOSORBIDE MONONITRATE ER 30 MG PO TB24
30.0000 mg | ORAL_TABLET | Freq: Every day | ORAL | Status: DC
  Administered 2017-05-17: 30 mg via ORAL
  Filled 2017-05-16: qty 1

## 2017-05-16 MED ORDER — SODIUM CHLORIDE 0.9 % IV SOLN
INTRAVENOUS | Status: DC
  Administered 2017-05-16: 13:00:00 via INTRAVENOUS

## 2017-05-16 MED ORDER — LORAZEPAM 0.5 MG PO TABS
0.5000 mg | ORAL_TABLET | Freq: Two times a day (BID) | ORAL | Status: DC | PRN
  Administered 2017-05-16 (×2): 0.5 mg via ORAL
  Filled 2017-05-16 (×2): qty 1

## 2017-05-16 MED ORDER — SODIUM CHLORIDE 0.9% FLUSH: 3.0000 mL | Freq: Two times a day (BID) | INTRAVENOUS | Status: DC

## 2017-05-16 MED ORDER — ROSUVASTATIN CALCIUM 20 MG PO TABS
20.0000 mg | ORAL_TABLET | Freq: Every day | ORAL | Status: DC
  Administered 2017-05-16: 20 mg via ORAL
  Filled 2017-05-16: qty 1

## 2017-05-16 MED ORDER — NITROGLYCERIN 1 MG/10 ML FOR IR/CATH LAB
INTRA_ARTERIAL | Status: AC
  Filled 2017-05-16: qty 10

## 2017-05-16 MED ORDER — FA-PYRIDOXINE-CYANOCOBALAMIN 2.5-25-2 MG PO TABS
1.0000 | ORAL_TABLET | Freq: Every day | ORAL | Status: DC
  Administered 2017-05-16: 22:00:00 1 via ORAL
  Filled 2017-05-16: qty 1

## 2017-05-16 MED ORDER — PRIMIDONE 50 MG PO TABS
100.0000 mg | ORAL_TABLET | Freq: Every day | ORAL | Status: DC
Start: 2017-05-17 — End: 2017-05-17
  Administered 2017-05-17: 09:00:00 100 mg via ORAL
  Filled 2017-05-16: qty 2

## 2017-05-16 MED ORDER — CELECOXIB 200 MG PO CAPS
200.0000 mg | ORAL_CAPSULE | Freq: Every day | ORAL | Status: DC
  Administered 2017-05-17: 200 mg via ORAL
  Filled 2017-05-16: qty 1

## 2017-05-16 MED ORDER — IOPAMIDOL (ISOVUE-370) INJECTION 76%
INTRAVENOUS | Status: AC
  Filled 2017-05-16: qty 100

## 2017-05-16 MED ORDER — LITHIUM CARBONATE 150 MG PO CAPS
150.0000 mg | ORAL_CAPSULE | Freq: Every day | ORAL | Status: DC
  Administered 2017-05-16: 150 mg via ORAL
  Filled 2017-05-16: qty 1

## 2017-05-16 MED ORDER — VITAMIN B-6 100 MG PO TABS: 500.0000 mg | ORAL_TABLET | Freq: Two times a day (BID) | ORAL | Status: DC

## 2017-05-16 MED ORDER — ASPIRIN 81 MG PO CHEW
81.0000 mg | CHEWABLE_TABLET | ORAL | Status: AC
  Administered 2017-05-16: 81 mg via ORAL

## 2017-05-16 MED ORDER — ANGIOPLASTY BOOK
Freq: Once | Status: DC
  Filled 2017-05-16: qty 1

## 2017-05-16 MED ORDER — FESOTERODINE FUMARATE ER 8 MG PO TB24
8.0000 mg | ORAL_TABLET | Freq: Every day | ORAL | Status: DC
  Administered 2017-05-17: 8 mg via ORAL
  Filled 2017-05-16: qty 1

## 2017-05-16 SURGICAL SUPPLY — 19 items
BALLN EMERGE MR 2.0X12 (BALLOONS) ×2
BALLN ~~LOC~~ EUPHORA RX 3.0X12 (BALLOONS) ×2
BALLOON EMERGE MR 2.0X12 (BALLOONS) IMPLANT
BALLOON ~~LOC~~ EUPHORA RX 3.0X12 (BALLOONS) IMPLANT
CATH 5FR JL3.5 JR4 ANG PIG MP (CATHETERS) ×1 IMPLANT
CATH LAUNCHER 6FR EBU3.5 (CATHETERS) ×1 IMPLANT
CATH VISTA GUIDE 6FR XBLAD3.5 (CATHETERS) ×1 IMPLANT
DEVICE RAD COMP TR BAND LRG (VASCULAR PRODUCTS) ×1 IMPLANT
GLIDESHEATH SLEND SS 6F .021 (SHEATH) ×1 IMPLANT
GUIDEWIRE INQWIRE 1.5J.035X260 (WIRE) IMPLANT
INQWIRE 1.5J .035X260CM (WIRE) ×2
KIT ENCORE 26 ADVANTAGE (KITS) ×1 IMPLANT
KIT HEART LEFT (KITS) ×2 IMPLANT
PACK CARDIAC CATHETERIZATION (CUSTOM PROCEDURE TRAY) ×2 IMPLANT
STENT RESOLUTE ONYX 3.0X18 (Permanent Stent) ×1 IMPLANT
TRANSDUCER W/STOPCOCK (MISCELLANEOUS) ×2 IMPLANT
TUBING CIL FLEX 10 FLL-RA (TUBING) ×2 IMPLANT
WIRE COUGAR XT STRL 190CM (WIRE) ×1 IMPLANT
WIRE HI TORQ VERSACORE-J 145CM (WIRE) ×1 IMPLANT

## 2017-05-16 NOTE — Interval H&P Note (Signed)
Cath Lab Visit (complete for each Cath Lab visit)  Clinical Evaluation Leading to the Procedure:   ACS: No.  Non-ACS:    Anginal Classification: CCS III  Anti-ischemic medical therapy: Minimal Therapy (1 class of medications)  Non-Invasive Test Results: Intermediate-risk stress test findings: cardiac mortality 1-3%/year  Prior CABG: No previous CABG   History and Physical Interval Note:  05/16/2017 1:57 PM  Meghan Oliver  has presented today for surgery, with the diagnosis of cp  The various methods of treatment have been discussed with the patient and family. After consideration of risks, benefits and other options for treatment, the patient has consented to  Procedure(s): LEFT HEART CATH AND CORONARY ANGIOGRAPHY (N/A) as a surgical intervention .  The patient's history has been reviewed, patient examined, no change in status, stable for surgery.  I have reviewed the patient's chart and labs.  Questions were answered to the patient's satisfaction.     [place] [employer]

## 2017-05-16 NOTE — Progress Notes (Signed)
TR BAND REMOVAL  LOCATION:    right radial  DEFLATED PER PROTOCOL:    Yes.    TIME BAND OFF / DRESSING APPLIED:    2000   SITE UPON ARRIVAL:    Level 0  SITE AFTER BAND REMOVAL:    Level 0  CIRCULATION SENSATION AND MOVEMENT:    Within Normal Limits   Yes.    COMMENTS:   Rechecked site at 2015 and 2030 with no change noted

## 2017-05-16 NOTE — H&P (View-Only) (Signed)
Patient ID: Meghan Oliver, female   DOB: Sep 01, 1941, 76 y.o.   MRN: [id number] PCP: Dr. Renold Genta  76 yo with history of bipolar disorder, carotid stenosis, HTN and hyperlipidemia presents for cardiology followup.  Last echo in 12/14 showed normal EF, mild AI and very mild AS.  Cardiolite in 7/16 showed EF 76% with no ischemia/infarction.  She has severe depression and gets periodic ECT at [facility].   For about 3 years, she has had episodes of burning in her lower neck , sometimes radiating up.  These episodes have no particular trigger (not meals or exercise).  In the past, her family had thought they represented GERD and she has been on a PPI and H-2 blocker as well as Mylanta prn.  She will have some relief but not always complete when she takes Mylanta.  Over the last few months, she has had more of the lower neck burning episodes.  They can last up to an hour at a time and sometimes occur daily.  Last Friday, she had an episode where the pain ran up to her jaw.  She was not doing anything in particular when it started. She went to the ER.  Troponin was negative and ECG unchanged.  She had a coronary CTA that showed extensive calcification in the proximal to mid LAD, no definite severe stenosis but cannot rule out.  The study quality was not good enough to interpret by FFR.    Since that time, she has not had a severe episode of neck burning, she has just had her old pattern of periodic episodes (that have been going on for 3 years now).    She has baseline dyspnea walking up a flight of steps.  Her depression has been worse recently and she will need ECT again.  She has had problems recently with her memory.   ECG (personally reviewed): NSR, septal Qs (same as prior ECGs)  Labs (4/16): K 3.3, creatinine 0.76 Labs (7/18): K 4.4, creatinine 1.02  PMH: 1. GERD 2. Bipolar disorder: On lithium. History of ECT.  3. HTN 4. Tremor 5. Hyperlipidemia 6. Carotid stenosis: Carotid dopplers (7/14) with 40-59%  bilateral ICA stenosis.  Carotid dopplers (7/15) with 40-59% BICA stenosis.  7. CAD: Coronary calcium seen on CT.   - ETT (7/13) with 5'45" exercise, 71% MPHR (wanted to stop), no ischemic ECG changes but did not reach target heart rate.  - Lexiscan Cardiolite (4/16) with EF 76%, no ischemia/infarction.  - Coronary CTA (7/18): Interpretation limited by artifact.  Extensive calcification in the proximal to mid LAD, possible 50% stenosis but difficult to quantify due to artifact.   8. Aortic valve disorder:  Echo (2/12) with EF 55-60%, mild AI and MR, PA systolic pressure 36 mmHg.  Echo (12/14) with EF 65-70%, mild LVH, mild AI, very mild AS, PA systolic pressure 44 mmHg.  9. Lung nodules: CT chest 12/14 stable, no followup recommended.  10. Bradycardia: asymptomatic  SH: Married to [name], quit smoking in 1972, 2 daughters.  FH: Father with CAD.  ROS: All systems reviewed and negative except as per HPI.   Current Outpatient Prescriptions  Medication Sig Dispense Refill  . acetaminophen (TYLENOL) 500 MG tablet Take 1,000 mg by mouth every 6 (six) hours as needed for mild pain.    Marland Kitchen amLODipine (NORVASC) 10 MG tablet TAKE 1 TABLET(10 MG) BY MOUTH DAILY 90 tablet 3  . aspirin 81 MG tablet Take 81 mg by mouth daily.      Marland Kitchen  Calcium Carbonate-Vitamin D (CALCIUM 600/VITAMIN D PO) Take 1 tablet by mouth 2 (two) times daily.     . celecoxib (CELEBREX) 200 MG capsule TAKE 1 CAPSULE BY MOUTH DAILY 90 capsule 3  . cetirizine (ZYRTEC) 10 MG tablet Take 10 mg by mouth 3 times/day as needed-between meals & bedtime.     . Cholecalciferol (VITAMIN D3) 2000 UNITS TABS Take 4,000 Units by mouth daily.     . irbesartan (AVAPRO) 150 MG tablet Take 150 mg by mouth daily.     Marland Kitchen L-Methylfolate-B12-B6-B2 (CEREFOLIN) 03-04-49-5 MG TABS Take 1 tablet by mouth at bedtime.    Marland Kitchen levothyroxine (SYNTHROID, LEVOTHROID) 75 MCG tablet TAKE 1 TABLET(75 MCG) BY MOUTH DAILY 90 tablet 1  . LINZESS 290 MCG CAPS capsule Take 1  capsule (290 mcg total) by mouth daily before breakfast. 90 capsule 3  . LITHIUM PO Take 150 mg by mouth at bedtime. Takes every three days at HS    . LORazepam (ATIVAN) 0.5 MG tablet Take 0.5 mg by mouth 3 times/day as needed-between meals & bedtime for anxiety.     . memantine (NAMENDA) 10 MG tablet Take 10 mg by mouth 2 (two) times daily.    . Multiple Vitamins-Minerals (MULTIVITAMIN WITH MINERALS) tablet Take 1 tablet by mouth daily.      . nortriptyline (PAMELOR) 10 MG capsule Take 10 mg by mouth at bedtime.    Marland Kitchen omeprazole-sodium bicarbonate (ZEGERID) 40-1100 MG capsule Take 1 capsule by mouth 2 (two) times daily. 180 capsule 3  . pantoprazole (PROTONIX) 40 MG tablet Take 1 tablet (40 mg total) by mouth 2 (two) times daily before a meal. 60 tablet 3  . PARoxetine (PAXIL) 20 MG tablet Take by mouth at bedtime.     . primidone (MYSOLINE) 50 MG tablet 2 tabs AM and 1 tab QHS 270 tablet 1  . RA KRILL OIL 500 MG CAPS Take 500 mg by mouth daily.     . ranitidine (ZANTAC) 150 MG tablet Take 1 tablet (150 mg total) by mouth at bedtime. 30 tablet 3  . rosuvastatin (CRESTOR) 20 MG tablet TAKE 1 TABLET(20 MG) BY MOUTH DAILY 90 tablet 3  . spironolactone (ALDACTONE) 25 MG tablet Take 0.5 tablets (12.5 mg total) by mouth daily. 45 tablet 3  . TOVIAZ 8 MG TB24 tablet TAKE 1 TABLET BY MOUTH DAILY 90 tablet 3  . vitamin B-12 (CYANOCOBALAMIN) 1000 MCG tablet Take 1,000 mcg by mouth at bedtime.    . isosorbide mononitrate (IMDUR) 30 MG 24 hr tablet Take 1 tablet (30 mg total) by mouth daily. 30 tablet 6  . nitroGLYCERIN (NITROSTAT) 0.4 MG SL tablet Place 1 tablet (0.4 mg total) under the tongue every 5 (five) minutes as needed for chest pain. 25 tablet 3   No current facility-administered medications for this encounter.     BP (!) 120/56   Pulse 67   Wt 110 lb 8 oz (50.1 kg)   LMP  (LMP Unknown)   SpO2 100%   BMI 22.70 kg/m  General: NAD Neck: No JVD, no thyromegaly or thyroid nodule.  Lungs:  Clear to auscultation bilaterally with normal respiratory effort. CV: Nondisplaced PMI.  Heart regular S1/S2, no S3/S4, 2/6 early SEM RUSB.  No peripheral edema.  No carotid bruit.  Normal pedal pulses.  Abdomen: Soft, nontender, no hepatosplenomegaly, no distention.  Skin: Intact without lesions or rashes.  Neurologic: Alert and oriented x 3.  Psych: Flat affect. Extremities: No clubbing or cyanosis.  HEENT: Normal.  Assessment/Plan: 1. Carotid stenosis: Will need repeat carotid doppler evaluation, has not had since 2015.  2. CAD: Atypical pain (neck burning, nonexertional) for about 3 years but worse recently.  However, she has a concerning coronary CTA with extensive calcified plaque in the proximal to mid LAD. Due to artifact, hard to tell degree of stenosis and the study was not good enough to get FFR from it.  I am concerned that her symptoms could represent angina given the amount of coronary plaque she has.  ECG has septal Qs but this has been seen in the past.  - I will start her on Imdur 30 mg daily and give her sublingual NTG.  - Continue ASA 81 and statin.  - We discussed coronary catheterization.  As symptoms are worsening, I think this is the next step.  I told her and her husband the risks/benefits of the procedure and they agree to proceed.   3. Aortic valve disorder: Mild AI, very mild AS on echo in 12/14.  Murmur seems somewhat louder and there was extensive aortic valve calcification on the coronary CT.   - I will arrange for repeat echo to reassess AS.  4. Bradycardia: Mild, asymptomatic.  Avoid nodal blocking agents.   5. HTN: BP controlled on irbesartan, amlodipine, and spironolactone. Recent BMET with stable K. 6. Hyperlipidemia: Check lipids this week, goal LDL < 70 given carotid stenosis and CAD.   Followup in 2 wks after cath.   Loralie Champagne 05/11/2017

## 2017-05-16 NOTE — Care Management Note (Signed)
Case Management Note  Patient Details  Name: Meghan Oliver MRN: [id number] Date of Birth: [birth year]  Subjective/Objective:  From home, s/p coronary stent intervention, will be on plavix.                  Action/Plan: NCM will follow for dc needs.   Expected Discharge Date:                  Expected Discharge Plan:     In-House Referral:     Discharge planning Services  CM Consult  Post Acute Care Choice:    Choice offered to:     DME Arranged:    DME Agency:     HH Arranged:    HH Agency:     Status of Service:  In process, will continue to follow  If discussed at Long Length of Stay Meetings, dates discussed:    Additional Comments:  Zenon Mayo, RN 05/16/2017, 3:54 PM

## 2017-05-17 ENCOUNTER — Telehealth (HOSPITAL_COMMUNITY): Payer: Self-pay | Admitting: *Deleted

## 2017-05-17 ENCOUNTER — Encounter (HOSPITAL_COMMUNITY): Payer: Self-pay | Admitting: Cardiology

## 2017-05-17 ENCOUNTER — Ambulatory Visit (HOSPITAL_COMMUNITY)
Admission: RE | Admit: 2017-05-17 | Discharge: 2017-05-17 | Disposition: A | Discharge status: Home / Self Care | Payer: Medicare Other | Source: Ambulatory Visit | Attending: Cardiology | Admitting: Cardiology

## 2017-05-17 VITALS — BP 102/50 | HR 67

## 2017-05-17 DIAGNOSIS — R55 Syncope and collapse: Secondary | ICD-10-CM | POA: Diagnosis not present

## 2017-05-17 DIAGNOSIS — F319 Bipolar disorder, unspecified: Secondary | ICD-10-CM | POA: Diagnosis not present

## 2017-05-17 DIAGNOSIS — R011 Cardiac murmur, unspecified: Secondary | ICD-10-CM | POA: Diagnosis not present

## 2017-05-17 DIAGNOSIS — R001 Bradycardia, unspecified: Secondary | ICD-10-CM | POA: Diagnosis not present

## 2017-05-17 DIAGNOSIS — T148XXA Other injury of unspecified body region, initial encounter: Secondary | ICD-10-CM

## 2017-05-17 DIAGNOSIS — E785 Hyperlipidemia, unspecified: Secondary | ICD-10-CM | POA: Diagnosis not present

## 2017-05-17 DIAGNOSIS — Z7982 Long term (current) use of aspirin: Secondary | ICD-10-CM | POA: Diagnosis not present

## 2017-05-17 DIAGNOSIS — I2584 Coronary atherosclerosis due to calcified coronary lesion: Secondary | ICD-10-CM | POA: Diagnosis not present

## 2017-05-17 DIAGNOSIS — I358 Other nonrheumatic aortic valve disorders: Secondary | ICD-10-CM | POA: Diagnosis not present

## 2017-05-17 DIAGNOSIS — Z79899 Other long term (current) drug therapy: Secondary | ICD-10-CM | POA: Diagnosis not present

## 2017-05-17 DIAGNOSIS — I10 Essential (primary) hypertension: Secondary | ICD-10-CM | POA: Diagnosis not present

## 2017-05-17 DIAGNOSIS — Z8249 Family history of ischemic heart disease and other diseases of the circulatory system: Secondary | ICD-10-CM | POA: Diagnosis not present

## 2017-05-17 DIAGNOSIS — I200 Unstable angina: Secondary | ICD-10-CM

## 2017-05-17 DIAGNOSIS — I25110 Atherosclerotic heart disease of native coronary artery with unstable angina pectoris: Secondary | ICD-10-CM | POA: Diagnosis not present

## 2017-05-17 DIAGNOSIS — I6529 Occlusion and stenosis of unspecified carotid artery: Secondary | ICD-10-CM | POA: Diagnosis not present

## 2017-05-17 DIAGNOSIS — D649 Anemia, unspecified: Secondary | ICD-10-CM | POA: Diagnosis not present

## 2017-05-17 LAB — BASIC METABOLIC PANEL
Anion gap: 5 (ref 5–15)
BUN: 17 mg/dL (ref 6–20)
CO2: 25 mmol/L (ref 22–32)
CREATININE: 0.94 mg/dL (ref 0.44–1.00)
Calcium: 9.3 mg/dL (ref 8.9–10.3)
Chloride: 106 mmol/L (ref 101–111)
GFR calc Af Amer: >60 mL/min (ref >60)
GFR, EST NON AFRICAN AMERICAN: 57 mL/min — AB (ref >60)
GLUCOSE: 92 mg/dL (ref 65–99)
POTASSIUM: 4.1 mmol/L (ref 3.5–5.1)
Sodium: 136 mmol/L (ref 135–145)

## 2017-05-17 LAB — CBC
HEMATOCRIT: 29.6 % — AB (ref 36.0–46.0)
HEMATOCRIT: 27.9 % — AB (ref 36.0–46.0)
HEMOGLOBIN: 9 g/dL — AB (ref 12.0–15.0)
Hemoglobin: 9.9 g/dL — ABNORMAL LOW (ref 12.0–15.0)
MCH: 30.5 pg (ref 26.0–34.0)
MCH: 29.6 pg (ref 26.0–34.0)
MCHC: 33.4 g/dL (ref 30.0–36.0)
MCHC: 32.3 g/dL (ref 30.0–36.0)
MCV: 91.1 fL (ref 78.0–100.0)
MCV: 91.8 fL (ref 78.0–100.0)
Platelets: 231 10*3/uL (ref 150–400)
Platelets: 219 10*3/uL (ref 150–400)
RBC: 3.25 MIL/uL — ABNORMAL LOW (ref 3.87–5.11)
RBC: 3.04 MIL/uL — AB (ref 3.87–5.11)
RDW: 14.2 % (ref 11.5–15.5)
RDW: 14.4 % (ref 11.5–15.5)
WBC: 6.6 10*3/uL (ref 4.0–10.5)
WBC: 7.6 10*3/uL (ref 4.0–10.5)

## 2017-05-17 MED ORDER — OMEGA-3-ACID ETHYL ESTERS 1 G PO CAPS: 1.0000 g | ORAL_CAPSULE | Freq: Every day | ORAL | 12 refills | Status: DC

## 2017-05-17 MED ORDER — ROSUVASTATIN CALCIUM 20 MG PO TABS: 20.0000 mg | ORAL_TABLET | Freq: Every day | ORAL | Status: DC

## 2017-05-17 MED ORDER — LORAZEPAM 0.5 MG PO TABS
1.0000 mg | ORAL_TABLET | Freq: Once | ORAL | Status: AC
  Administered 2017-05-17: 09:00:00 1 mg via ORAL
  Filled 2017-05-17: qty 2

## 2017-05-17 MED ORDER — CLOPIDOGREL BISULFATE 75 MG PO TABS
75.0000 mg | ORAL_TABLET | Freq: Every day | ORAL | 12 refills | Status: DC
Start: 2017-05-17 — End: 2018-03-18

## 2017-05-17 NOTE — Telephone Encounter (Signed)
Patient's husband called and stated that she was discharged from the hostpial earlier today and patient was standing at her sink and passed out.  BP 83/50.  Patient is now feeling fine but he wanted [name] to be aware.  I spoke with Dr. Aundra Dubin and he advises patient to come in for stat CBC and BP check.  Patient may need fluids and should hold spiro, irbesartan, and amlodipine. Patient is scheduled for nurse visit this afternoon.

## 2017-05-17 NOTE — Progress Notes (Signed)
CARDIAC REHAB PHASE I   PRE:  Rate/Rhythm: 58 SB    BP: sitting 145/56    SaO2:   MODE:  Ambulation: 200 ft   POST:  Rate/Rhythm: 71 SR    BP: sitting 120/53     SaO2:   Pt fairly steady with gait belt. C/o significant SOB and right hip pain.  She has not been very active so ? SOB due to deconditioning. Tired after walk and wanting to return to bed. Ed completed with [name], caretaker, and pt. Understand importance of Plavix/ASA. Encouraged increasing walking as tolerated at home. Gave diet sheet however pt has had 15 lb weight loss therefore needs nutritional support. Will refer to [place].  [id number]  [place], ACSM 05/17/2017 8:47 AM

## 2017-05-17 NOTE — Progress Notes (Signed)
Patient given discharge instructions to include follow up appointments, medication list with what meds were given today and need to be taken later, where to pick up new perscriptions, and education concerning right radial site care. Patient is discharging with a caretaker via car. Daneil Dolin, RN

## 2017-05-17 NOTE — Progress Notes (Signed)
P/atient ID: Meghan Oliver, female   DOB: 12-Dec-1940, 76 y.o.   MRN: [id number] PCP: Dr. Renold Genta  76 yo with history of bipolar disorder, carotid stenosis, HTN and hyperlipidemia presents for cardiology followup.  Last echo in 12/14 showed normal EF, mild AI and very mild AS.  Cardiolite in 7/16 showed EF 76% with no ischemia/infarction.  She has severe depression and gets periodic ECT at [facility].   For about 3 years, she has had episodes of burning in her lower neck , sometimes radiating up.  These episodes have no particular trigger (not meals or exercise).  In the past, her family had thought they represented GERD and she has been on a PPI and H-2 blocker as well as Mylanta prn.  She will have some relief but not always complete when she takes Mylanta.  Over the last few months, she has had more of the lower neck burning episodes.  They can last up to an hour at a time and sometimes occur daily.  Last Friday, she had an episode where the pain ran up to her jaw.  She was not doing anything in particular when it started. She went to the ER.  Troponin was negative and ECG unchanged.  She had a coronary CTA that showed extensive calcification in the proximal to mid LAD, no definite severe stenosis but cannot rule out.  The study quality was not good enough to interpret by FFR.    Had [facility] on 05/16/17, found to have a 90% proximal LAD lesion. DES placed and she was started on ASA and Plavix. She was discharged on 05/17/17.   Today, she presents as a work in visit. She was sent home from the hospital earlier today post stent placement. She was walking across the kitchen floor and became pale. Lost consciousness. Her caregiver caught her and she did not strike her head on anything. She says she feels ok now. Orthostatic vitals done lying - 102/, sitting 96/48, standing 80/44. She has a small hematoma on her left buttock, this is soft, about 6 cm in size. Hemoglobin was 9.9 discharge.    ECG (personally reviewed): NSR,  septal Qs (same as prior ECGs)  Labs (4/16): K 3.3, creatinine 0.76 Labs (7/18): K 4.4, creatinine 1.02  PMH: 1. GERD 2. Bipolar disorder: On lithium. History of ECT.  3. HTN 4. Tremor 5. Hyperlipidemia 6. Carotid stenosis: Carotid dopplers (7/14) with 40-59% bilateral ICA stenosis.  Carotid dopplers (7/15) with 40-59% BICA stenosis.  7. CAD: Coronary calcium seen on CT.   - ETT (7/13) with 5'45" exercise, 71% MPHR (wanted to stop), no ischemic ECG changes but did not reach target heart rate.  - Lexiscan Cardiolite (4/16) with EF 76%, no ischemia/infarction.  - Coronary CTA (7/18): Interpretation limited by artifact.  Extensive calcification in the proximal to mid LAD, possible 50% stenosis but difficult to quantify due to artifact.   8. Aortic valve disorder:  Echo (2/12) with EF 55-60%, mild AI and MR, PA systolic pressure 36 mmHg.  Echo (12/14) with EF 65-70%, mild LVH, mild AI, very mild AS, PA systolic pressure 44 mmHg.  9. Lung nodules: CT chest 12/14 stable, no followup recommended.  10. Bradycardia: asymptomatic  SH: Married to [name], quit smoking in 1972, 2 daughters.  FH: Father with CAD.  ROS: All systems reviewed and negative except as per HPI.   Current Outpatient Prescriptions  Medication Sig Dispense Refill  . acetaminophen (TYLENOL) 500 MG tablet Take 1,000 mg by mouth  every 6 (six) hours as needed for mild pain.    Marland Kitchen amLODipine (NORVASC) 10 MG tablet TAKE 1 TABLET(10 MG) BY MOUTH DAILY 90 tablet 3  . aspirin 81 MG tablet Take 81 mg by mouth daily.      . Calcium Carbonate-Vitamin D (CALCIUM 600/VITAMIN D PO) Take 1 tablet by mouth 2 (two) times daily.     . cetirizine (ZYRTEC) 10 MG tablet Take 10 mg by mouth 3 times/day as needed-between meals & bedtime.     . Cholecalciferol (VITAMIN D3) 2000 UNITS TABS Take 4,000 Units by mouth daily.     . clopidogrel (PLAVIX) 75 MG tablet Take 1 tablet (75 mg total) by mouth daily with breakfast. 30 tablet 12  .  irbesartan (AVAPRO) 150 MG tablet Take 150 mg by mouth daily.     Marland Kitchen L-Methylfolate-B12-B6-B2 (CEREFOLIN) 03-04-49-5 MG TABS Take 1 tablet by mouth at bedtime.    Marland Kitchen levothyroxine (SYNTHROID, LEVOTHROID) 75 MCG tablet TAKE 1 TABLET(75 MCG) BY MOUTH DAILY 90 tablet 1  . LINZESS 290 MCG CAPS capsule Take 1 capsule (290 mcg total) by mouth daily before breakfast. (Patient taking differently: Take 290 mcg by mouth at bedtime as needed (for constipation). ) 90 capsule 3  . lithium carbonate (LITHOBID) 300 MG CR tablet Take 150 mg by mouth at bedtime.     Marland Kitchen LORazepam (ATIVAN) 0.5 MG tablet Take 0.5 mg by mouth 3 times/day as needed-between meals & bedtime for anxiety.     . memantine (NAMENDA) 10 MG tablet Take 10 mg by mouth 2 (two) times daily.    . Multiple Vitamins-Minerals (MULTIVITAMIN WITH MINERALS) tablet Take 1 tablet by mouth daily.      . nitroGLYCERIN (NITROSTAT) 0.4 MG SL tablet Place 1 tablet (0.4 mg total) under the tongue every 5 (five) minutes as needed for chest pain. 25 tablet 3  . nortriptyline (PAMELOR) 10 MG capsule Take 10 mg by mouth at bedtime.    Marland Kitchen omega-3 acid ethyl esters (LOVAZA) 1 g capsule Take 1 capsule (1 g total) by mouth daily. 30 capsule 12  . pantoprazole (PROTONIX) 40 MG tablet Take 1 tablet (40 mg total) by mouth 2 (two) times daily before a meal. (Patient not taking: Reported on 05/13/2017) 60 tablet 3  . PARoxetine (PAXIL) 20 MG tablet Take 20 mg by mouth at bedtime.     . primidone (MYSOLINE) 50 MG tablet 2 tabs AM and 1 tab QHS (Patient taking differently: Take 50-100 mg by mouth 2 (two) times daily. TAKE 2 TABLETS (100 MG) IN THE MORNING & 1 TABLET (50 MG) AT BEDTIME) 270 tablet 1  . Pyridoxine HCl (VITAMIN B-6) 500 MG tablet Take 500 mg by mouth 2 (two) times daily.    . rosuvastatin (CRESTOR) 20 MG tablet TAKE 1 TABLET(20 MG) BY MOUTH DAILY (Patient taking differently: TAKE 1 TABLET(20 MG) BY MOUTH DAILY IN THE EVENING.) 90 tablet 3  . spironolactone (ALDACTONE) 25  MG tablet Take 0.5 tablets (12.5 mg total) by mouth daily. 45 tablet 3  . TOVIAZ 8 MG TB24 tablet TAKE 1 TABLET BY MOUTH DAILY 90 tablet 3  . vitamin B-12 (CYANOCOBALAMIN) 1000 MCG tablet Take 1,000 mcg by mouth at bedtime.     No current facility-administered medications for this encounter.     BP (!) 102/50   Pulse 67   LMP  (LMP Unknown)   SpO2 95%  General: Female, NAD.  Neck: Supple, No JVD. no thyromegaly or thyroid nodule.  Lungs: Clear  bilaterally, normal effort.  CV: Nondisplaced PMI. Regular rate and rhythm. 2/6 early SEM RUSB.  No peripheral edema.  No carotid bruit.  Normal pedal pulses.  Abdomen: Soft, non tender, non distended.  no hepatosplenomegaly.  Skin: Intact without lesions or rashes.  Neurologic: Alert and oriented x 3.  Psych: Flat affect. Extremities: No clubbing or cyanosis.  HEENT: Normal.    Assessment/Plan: 1. Syncope: Will place 30 day monitor. After talking with her husband and caregiver she has had 2-3 episodes of presyncope in the past month. Will check CBC today. Given 500 ml of fluid in the clinic today. Will hold her BP meds today, she has a home Therapist, sports. If her SBP > 130 tomorrow, ok for her to restart meds.   2. Carotid stenosis:  - Has carotid ultrasound ordered for 8/23.  3. CAD: s/p proximal LAD DES.   - Continue DAPT with ASA and Plavix.   3. Aortic valve disorder: Mild AI, very mild AS on echo in 12/14.  - Mild AS with moderate AI in 05/2017.   5. Orthostatic hypotension: Given IV fluid today in the clinic.   6. Hyperlipidemia:  - Continue Crestor 20 mg. Will need LFT's at next visit.   7. Small hematoma on Left buttock - Stable in size. Will check CBC.   Follow up with Dr. Aundra Dubin next week.   Arbutus Leas 05/17/2017

## 2017-05-17 NOTE — Discharge Summary (Signed)
Advanced Heart Failure Discharge Note  Discharge Summary   Patient ID: NEFERTITI MOHAMAD MRN: [id number], DOB/AGE: 1941/03/30 76 y.o. Admit date: 05/16/2017 D/C date:     05/17/2017   Primary Discharge Diagnoses:  1. CAD: History of angina 2. Anemia 3. Aortic valve disease 4. Carotid stenosis - Dopplers ordered  Hospital Course:   SIBBIE FLAMMIA is a 76 y.o. female with profound depression and mild aortic stenosis. Presented 05/16/17 for schedule cath due to abnormal coronary CT and chest pain.   S/p LHC and coronary angio 05/16/17 with focal severe proximal LAD stenosis near origin of D1 and nonobstructive disease in LCx and RCA systems. Underwent successful PTCA/DES x 1 proximal LAD.   Plan for DAPT with ASA and Plavix for at least one year. Statin also added.     Pt did note hematoma on her buttocks. She states she came down hard when getting down off cath table. ? If this is cause. This was marked in the hospital and will be followed by her home nurse.   Pt examined am of 05/17/17 and thought overall stable for discharge to home with close follow up as below.  Discharge Weight Range: 103 lbs Discharge Vitals: Blood pressure (!) 145/66, pulse (!) 54, temperature 98.3 F (36.8 C), temperature source Oral, resp. rate 14, height 5\' 2"  (1.575 m), weight 103 lb 9.9 oz (47 kg), SpO2 98 %.  Labs: Lab Results  Component Value Date   WBC 6.6 05/17/2017   HGB 9.9 (L) 05/17/2017   HCT 29.6 (L) 05/17/2017   MCV 91.1 05/17/2017   PLT 231 05/17/2017     Recent Labs Lab 05/17/17 0517  NA 136  K 4.1  CL 106  CO2 25  BUN 17  CREATININE 0.94  CALCIUM 9.3  GLUCOSE 92   Lab Results  Component Value Date   CHOL 239 (H) 07/23/2016   HDL 165 07/23/2016   LDLCALC 59 07/23/2016   TRIG 73 07/23/2016   BNP (last 3 results) No results for input(s): BNP in the last 8760 hours.  ProBNP (last 3 results) No results for input(s): PROBNP in the last 8760 hours.   Diagnostic  Studies/Procedures   No results found.  Discharge Medications   Allergies as of 05/17/2017      Reactions   Propranolol Other (See Comments)   Low blood pressure   Brexpiprazole Other (See Comments)   Aphasia and catatonia      Medication List    STOP taking these medications   celecoxib 200 MG capsule Commonly known as:  CELEBREX   isosorbide mononitrate 30 MG 24 hr tablet Commonly known as:  IMDUR   omeprazole-sodium bicarbonate 40-1100 MG capsule Commonly known as:  ZEGERID   RA KRILL OIL 500 MG Caps   ranitidine 150 MG tablet Commonly known as:  ZANTAC     TAKE these medications   acetaminophen 500 MG tablet Commonly known as:  TYLENOL Take 1,000 mg by mouth every 6 (six) hours as needed for mild pain.   amLODipine 10 MG tablet Commonly known as:  NORVASC TAKE 1 TABLET(10 MG) BY MOUTH DAILY   aspirin 81 MG tablet Take 81 mg by mouth daily.   CALCIUM 600/VITAMIN D PO Take 1 tablet by mouth 2 (two) times daily.   CEREFOLIN 03-04-49-5 MG Tabs Take 1 tablet by mouth at bedtime.   cetirizine 10 MG tablet Commonly known as:  ZYRTEC Take 10 mg by mouth 3 times/day as needed-between meals & bedtime.  clopidogrel 75 MG tablet Commonly known as:  PLAVIX Take 1 tablet (75 mg total) by mouth daily with breakfast.   irbesartan 150 MG tablet Commonly known as:  AVAPRO Take 150 mg by mouth daily.   levothyroxine 75 MCG tablet Commonly known as:  SYNTHROID, LEVOTHROID TAKE 1 TABLET(75 MCG) BY MOUTH DAILY   LINZESS 290 MCG Caps capsule Generic drug:  linaclotide Take 1 capsule (290 mcg total) by mouth daily before breakfast. What changed:  when to take this  reasons to take this   lithium carbonate 300 MG CR tablet Commonly known as:  LITHOBID Take 150 mg by mouth at bedtime.   LORazepam 0.5 MG tablet Commonly known as:  ATIVAN Take 0.5 mg by mouth 3 times/day as needed-between meals & bedtime for anxiety.   memantine 10 MG tablet Commonly known  as:  NAMENDA Take 10 mg by mouth 2 (two) times daily.   multivitamin with minerals tablet Take 1 tablet by mouth daily.   nitroGLYCERIN 0.4 MG SL tablet Commonly known as:  NITROSTAT Place 1 tablet (0.4 mg total) under the tongue every 5 (five) minutes as needed for chest pain.   nortriptyline 10 MG capsule Commonly known as:  PAMELOR Take 10 mg by mouth at bedtime.   omega-3 acid ethyl esters 1 g capsule Commonly known as:  LOVAZA Take 1 capsule (1 g total) by mouth daily.   pantoprazole 40 MG tablet Commonly known as:  PROTONIX Take 1 tablet (40 mg total) by mouth 2 (two) times daily before a meal.   PARoxetine 20 MG tablet Commonly known as:  PAXIL Take 20 mg by mouth at bedtime.   primidone 50 MG tablet Commonly known as:  MYSOLINE 2 tabs AM and 1 tab QHS What changed:  how much to take  how to take this  when to take this  additional instructions   rosuvastatin 20 MG tablet Commonly known as:  CRESTOR TAKE 1 TABLET(20 MG) BY MOUTH DAILY What changed:  See the new instructions.   spironolactone 25 MG tablet Commonly known as:  ALDACTONE Take 0.5 tablets (12.5 mg total) by mouth daily.   TOVIAZ 8 MG Tb24 tablet Generic drug:  fesoterodine TAKE 1 TABLET BY MOUTH DAILY   vitamin B-12 1000 MCG tablet Commonly known as:  CYANOCOBALAMIN Take 1,000 mcg by mouth at bedtime.   vitamin B-6 500 MG tablet Take 500 mg by mouth 2 (two) times daily.   Vitamin D3 2000 units Tabs Take 4,000 Units by mouth daily.       Disposition   The patient will be discharged in stable condition to home. Discharge Instructions    Amb Referral to Cardiac Rehabilitation    Complete by:  As directed    Diagnosis:   Coronary Stents PTCA     Diet - low sodium heart healthy    Complete by:  As directed    Increase activity slowly    Complete by:  As directed      Follow-up Information    Larey Dresser, MD Follow up on 05/30/2017.   Specialty:  Cardiology Why:  at  1030 am for hospital follow up with Dr. Aundra Dubin. Garage code [id number].  Contact information: [place] [address] [place] Follow up on 05/20/2017.   Specialty:  Cardiology Why:  at 0945 at Dr. Oleh Genin office for post hospital labwork.  Garage Code  [id number] Brine information: [address] [id number] [place] [phone]           Duration of Discharge Encounter: Greater than 35 minutes   Signed, Annamaria Helling 05/17/2017, 9:49 AM

## 2017-05-17 NOTE — Care Management Note (Signed)
Case Management Note  Patient Details  Name: Meghan Oliver MRN: [id number] Date of Birth: 03-Apr-1941  Subjective/Objective:     From home with spouse, she has a private caregiver, [name], who helps with her medications and takes her to [facility], s/p coronary stent intervention, will be on plavix. She is for dc today.                              Action/Plan: NCM will follow for dc needs.   Expected Discharge Date:  05/17/17               Expected Discharge Plan:  Home/Self Care  In-House Referral:     Discharge planning Services  CM Consult  Post Acute Care Choice:    Choice offered to:     DME Arranged:    DME Agency:     HH Arranged:    HH Agency:     Status of Service:  Completed, signed off  If discussed at [employer] of Stay Meetings, dates discussed:    Additional Comments:  Zenon Mayo, RN 05/17/2017, 9:59 AM

## 2017-05-17 NOTE — Progress Notes (Signed)
Patient ID: Meghan Oliver, female   DOB: [birth year], 76 y.o.   MRN: [id number]     Advanced Heart Failure Rounding Note  Primary Cardiologist: Aundra Dubin  Subjective:    Anxious this morning, did not get her usual Ativan.  No chest pain.  Has periods of dyspnea.  Hemoglobin a bit lower than prior at 9.9 this morning, has small hematoma on her buttocks, says that she came down "hard" on the cath table yesterday.   Coronary Findings (8/13)  Dominance: Right  Left Main  No significant disease.  Left Anterior Descending  80-90% focal calcified stenosis in the proximal LAD near the origin of a moderate 1st diagonal. D1 has mild (30%) ostial narrowing.  Ramus Intermedius  Small to moderate vessel, 25% proximal stenosis.  Left Circumflex  Moderate PLOM with small AV LCx after PLOM take-off. Mild luminal irregularities.  Right Coronary Artery  Luminal irregularities.  Patient had PCI to proximal LAD with DES.    Objective:   Weight Range: 103 lb 9.9 oz (47 kg) Body mass index is 18.95 kg/m.   Vital Signs:   Temp:  [97.7 F (36.5 C)-98.3 F (36.8 C)] 98.3 F (36.8 C) (08/14 0735) Pulse Rate:  [0-101] 54 (08/14 0735) Resp:  [0-35] 14 (08/14 0735) BP: (110-200)/(40-100) 145/66 (08/14 0735) SpO2:  [0 %-100 %] 98 % (08/14 0735) Weight:  [103 lb 9.9 oz (47 kg)-104 lb (47.2 kg)] 103 lb 9.9 oz (47 kg) (08/14 0535) Last BM Date: 05/14/17 (normal per caregiver)  Weight change: Filed Weights   05/16/17 1138 05/17/17 0535  Weight: 104 lb (47.2 kg) 103 lb 9.9 oz (47 kg)    Intake/Output:   Intake/Output Summary (Last 24 hours) at 05/17/17 0844 Last data filed at 05/17/17 0553  Gross per 24 hour  Intake              830 ml  Output              400 ml  Net              430 ml      Physical Exam    General:  Well appearing. No resp difficulty HEENT: Normal Neck: Supple. JVP not elevated. Carotids 2+ bilat; no bruits. No lymphadenopathy or thyromegaly appreciated. Cor: PMI  nondisplaced. Regular rate & rhythm. No rubs, gallops or murmurs. Lungs: Clear to auscultation bilaterally.  Abdomen: Soft, nontender, nondistended. No hepatosplenomegaly. No bruits or masses. Good bowel sounds. Extremities: No cyanosis, clubbing, rash, edema. Small ecchymosis around right radial cath area but looks overall ok with good pulse.  Neuro: Alert & orientedx3, cranial nerves grossly intact. moves all 4 extremities w/o difficulty. Affect pleasant Skin: Small buttocks hematoma   Telemetry   NSR, personally reviewed.   Labs    CBC  Recent Labs  05/17/17 0517  WBC 6.6  HGB 9.9*  HCT 29.6*  MCV 91.1  PLT 614   Basic Metabolic Panel  Recent Labs  05/17/17 0517  NA 136  K 4.1  CL 106  CO2 25  GLUCOSE 92  BUN 17  CREATININE 0.94  CALCIUM 9.3   Liver Function Tests No results for input(s): AST, ALT, ALKPHOS, BILITOT, PROT, ALBUMIN in the last 72 hours. No results for input(s): LIPASE, AMYLASE in the last 72 hours. Cardiac Enzymes No results for input(s): CKTOTAL, CKMB, CKMBINDEX, TROPONINI in the last 72 hours.  BNP: BNP (last 3 results) No results for input(s): BNP in the last 8760 hours.  ProBNP (  last 3 results) No results for input(s): PROBNP in the last 8760 hours.   D-Dimer No results for input(s): DDIMER in the last 72 hours. Hemoglobin A1C No results for input(s): HGBA1C in the last 72 hours. Fasting Lipid Panel No results for input(s): CHOL, HDL, LDLCALC, TRIG, CHOLHDL, LDLDIRECT in the last 72 hours. Thyroid Function Tests No results for input(s): TSH, T4TOTAL, T3FREE, THYROIDAB in the last 72 hours.  Invalid input(s): FREET3  Other results:   Imaging     No results found.   Medications:     Scheduled Medications: . amLODipine  10 mg Oral Daily  . angioplasty book   Does not apply Once  . aspirin EC  81 mg Oral Daily  . celecoxib  200 mg Oral Daily  . cholecalciferol  4,000 Units Oral Daily  . clopidogrel  600 mg Oral Once    . clopidogrel  75 mg Oral Q breakfast  . feeding supplement (ENSURE ENLIVE)  237 mL Oral BID BM  . fesoterodine  8 mg Oral Daily  . folic acid-pyridoxine-cyancobalamin  1 tablet Oral QHS  . irbesartan  150 mg Oral Daily  . isosorbide mononitrate  30 mg Oral Daily  . levothyroxine  75 mcg Oral QAC breakfast  . lithium carbonate  150 mg Oral QHS  . memantine  10 mg Oral BID  . multivitamin with minerals  1 tablet Oral Daily  . nortriptyline  10 mg Oral QHS  . omega-3 acid ethyl esters  1 g Oral Daily  . pantoprazole  40 mg Oral BID AC  . PARoxetine  20 mg Oral QHS  . primidone  100 mg Oral Daily  . primidone  50 mg Oral QHS  . vitamin B-6  500 mg Oral BID  . rosuvastatin  20 mg Oral q1800  . sodium chloride flush  3 mL Intravenous Q12H  . spironolactone  12.5 mg Oral Daily  . vitamin B-12  1,000 mcg Oral QHS     Infusions: . sodium chloride       PRN Medications:  sodium chloride, acetaminophen, loratadine, LORazepam, nitroGLYCERIN, ondansetron (ZOFRAN) IV, sodium chloride flush    Patient Profile   76 yo with history of profound depression and mild aortic stenosis had cath yesterday for abnormal coronary CTA and chest pain.    Assessment/Plan   1. CAD: History of angina.  Cath yesterday showed 80-90% proximal LAD stenosis, treated with DES.  No chest pain overnight.  - Continue ASA 81/Plavix.  - Continue Crestor 20 mg daily at home, will need to check fasting lipids (not done today).  2. Anemia: Hemoglobin 9.9.  She has a hematoma on her buttocks, says she came down hard when getting off cath table yesterday, may be cause.   - Keep hematoma marked, home nurse will reassess.  - Needs CBC Friday.  3. Aortic valve disease: Mild AS, moderate AI on recent echo.  4. Carotid stenosis: Needs carotid dopplers (ordered).  5. Disposition: Followup with me in 2 wks.  This Friday, needs CBC + fasting lipids drawn.  Send home on prior home meds including Crestor 20 mg daily but  will add Plavix 75 mg daily.  Can stop Imdur at this point.     Length of Stay: 0  Loralie Champagne, MD  05/17/2017, 8:44 AM  Advanced Heart Failure Team Pager [phone] (M-F; 7a - 4p)  Please contact [facility] for night-coverage after hours (4p -7a ) and weekends on [email or web address]

## 2017-05-17 NOTE — Progress Notes (Signed)
24g x 1 attempt started to [facility] per Dr. Aundra Dubin VO for 500 cc NS 0.9% bolus after reported syncope by patient family over the phone. Patient resting in CHF clinic room 2 on stretcher with side rails up x 2 and family members x 2 at bedside. Call bell in reach. Will continue to monitor closely.  Renee Pain, RN

## 2017-05-17 NOTE — Patient Instructions (Signed)
Hold amlodipine, irbesartan, and spironolactone until Systolic BP is greater than 130.     30 Day event monitor has been ordered for you.   Please keep your follow up appointment.

## 2017-05-18 ENCOUNTER — Telehealth (HOSPITAL_COMMUNITY): Payer: Self-pay | Admitting: *Deleted

## 2017-05-18 ENCOUNTER — Other Ambulatory Visit (HOSPITAL_COMMUNITY): Payer: Medicare Other

## 2017-05-18 ENCOUNTER — Ambulatory Visit (HOSPITAL_COMMUNITY)
Admission: RE | Admit: 2017-05-18 | Discharge: 2017-05-18 | Disposition: A | Discharge status: Home / Self Care | Payer: Medicare Other | Source: Ambulatory Visit | Attending: Internal Medicine | Admitting: Internal Medicine

## 2017-05-18 DIAGNOSIS — I10 Essential (primary) hypertension: Secondary | ICD-10-CM | POA: Insufficient documentation

## 2017-05-18 LAB — CBC
HEMATOCRIT: 28.7 % — AB (ref 36.0–46.0)
Hemoglobin: 9.7 g/dL — ABNORMAL LOW (ref 12.0–15.0)
MCH: 30.5 pg (ref 26.0–34.0)
MCHC: 33.8 g/dL (ref 30.0–36.0)
MCV: 90.3 fL (ref 78.0–100.0)
PLATELETS: 233 10*3/uL (ref 150–400)
RBC: 3.18 MIL/uL — ABNORMAL LOW (ref 3.87–5.11)
RDW: 14 % (ref 11.5–15.5)
WBC: 8.5 10*3/uL (ref 4.0–10.5)

## 2017-05-18 NOTE — Telephone Encounter (Signed)
CBC  Order: [id number]  Status:  Final result Visible to patient:  Yes (MyChart) Dx:  Essential hypertension; SYSTOLIC MURMUR  Notes recorded by Darron Doom, RN on 05/18/2017 at 8:22 AM EDT Results discussed yesterday and patient is scheduled to return for repeat CBC today per Dr. Aundra Dubin. ------  Notes recorded by Larey Dresser, MD on 05/18/2017 at 1:44 AM EDT Repeat CBC Thursday

## 2017-05-19 ENCOUNTER — Telehealth (HOSPITAL_COMMUNITY): Payer: Self-pay | Admitting: *Deleted

## 2017-05-19 ENCOUNTER — Telehealth (HOSPITAL_COMMUNITY): Payer: Self-pay

## 2017-05-19 NOTE — Telephone Encounter (Signed)
Received call from family concerning patient with continued chest pain radiating to neck. ADvised should come to ED for further evaluation. Family hesitant to do so and would rather come to CHF clinic to see Dr. Aundra Dubin. Advised Dr. Aundra Dubin would not be in clinic rest of the week and should report to ED for further medical management. Patient's family states will try NTG to see if it helps and if so will report to ED.  Family states she also has GERD and anxiety and wonder if this may be culprit. Continued to advise to go to ED if another episode occurs. Aware and agreeable.  Renee Pain, RN

## 2017-05-19 NOTE — Telephone Encounter (Signed)
Pt's caregiver called to report pt has been having neck pain off/on bilat since her cath last week.  She states they discussed this earlier in the week when she saw Dr Aundra Dubin and they were told it could be spasms.  She states pt's VS are stable, BP is good and pt has not had any syncope or presyncope.  Advised of lab results from yesterday and recommendations for labs to be rechecked next week, they are agreeable with this.  Discussed neck issues w/Erin Tamala Julian, NP she states could be from laying on cath table, can try Tylenol if needs to, recommend heat compresses as needed and rest.  Caregiver aware and also ask about moving up carotid appt.  Carotids rescheduled to tomorrow at 11 am, they are aware and agreeable.

## 2017-05-20 ENCOUNTER — Ambulatory Visit (HOSPITAL_COMMUNITY)
Admission: RE | Admit: 2017-05-20 | Discharge: 2017-05-20 | Disposition: A | Discharge status: Home / Self Care | Payer: Medicare Other | Source: Ambulatory Visit | Attending: Cardiovascular Disease | Admitting: Cardiovascular Disease

## 2017-05-20 ENCOUNTER — Telehealth (HOSPITAL_COMMUNITY): Payer: Self-pay

## 2017-05-20 ENCOUNTER — Inpatient Hospital Stay (HOSPITAL_COMMUNITY): Admit: 2017-05-20 | Payer: Medicare Other

## 2017-05-20 DIAGNOSIS — E785 Hyperlipidemia, unspecified: Secondary | ICD-10-CM | POA: Diagnosis not present

## 2017-05-20 DIAGNOSIS — I6523 Occlusion and stenosis of bilateral carotid arteries: Secondary | ICD-10-CM | POA: Insufficient documentation

## 2017-05-20 DIAGNOSIS — Z87891 Personal history of nicotine dependence: Secondary | ICD-10-CM | POA: Diagnosis not present

## 2017-05-20 DIAGNOSIS — I10 Essential (primary) hypertension: Secondary | ICD-10-CM | POA: Insufficient documentation

## 2017-05-20 NOTE — Telephone Encounter (Signed)
Patient insurances is active and benefits verified. Patient insurances are Medicare A/B and BCBS Medicare supplement. Medicare A/B - no co-payment, deductible $183.00/$ has been met, 20% co-insurance, no out of pocket and no pre-authorization. Passport/reference [phone]. BCBS Medicare Supplement - no co-payment, no deductible, no out of pocket, no co-insurance and no pre-authorization. Follows medicare guidelines. Passport/reference [phone].  Patient will be contacted and scheduled after their follow up appointment with the cardiologist on 05/30/17, upon review by [facility] RN navigator.

## 2017-05-23 ENCOUNTER — Other Ambulatory Visit (HOSPITAL_COMMUNITY): Payer: Medicare Other

## 2017-05-24 ENCOUNTER — Encounter (HOSPITAL_COMMUNITY): Payer: Medicare Other | Admitting: Cardiology

## 2017-05-25 ENCOUNTER — Other Ambulatory Visit (HOSPITAL_COMMUNITY): Payer: Self-pay | Admitting: *Deleted

## 2017-05-25 ENCOUNTER — Encounter (HOSPITAL_COMMUNITY): Payer: Medicare Other | Admitting: Cardiology

## 2017-05-25 ENCOUNTER — Ambulatory Visit (HOSPITAL_COMMUNITY)
Admission: RE | Admit: 2017-05-25 | Discharge: 2017-05-25 | Disposition: A | Discharge status: Home / Self Care | Payer: Medicare Other | Source: Ambulatory Visit | Attending: Cardiology | Admitting: Cardiology

## 2017-05-25 DIAGNOSIS — M546 Pain in thoracic spine: Secondary | ICD-10-CM | POA: Diagnosis not present

## 2017-05-25 DIAGNOSIS — I10 Essential (primary) hypertension: Secondary | ICD-10-CM | POA: Diagnosis not present

## 2017-05-25 DIAGNOSIS — M47816 Spondylosis without myelopathy or radiculopathy, lumbar region: Secondary | ICD-10-CM | POA: Diagnosis not present

## 2017-05-25 DIAGNOSIS — I779 Disorder of arteries and arterioles, unspecified: Secondary | ICD-10-CM

## 2017-05-25 DIAGNOSIS — M4316 Spondylolisthesis, lumbar region: Secondary | ICD-10-CM | POA: Diagnosis not present

## 2017-05-25 DIAGNOSIS — E782 Mixed hyperlipidemia: Secondary | ICD-10-CM | POA: Insufficient documentation

## 2017-05-25 DIAGNOSIS — M5416 Radiculopathy, lumbar region: Secondary | ICD-10-CM | POA: Diagnosis not present

## 2017-05-25 DIAGNOSIS — I739 Peripheral vascular disease, unspecified: Principal | ICD-10-CM

## 2017-05-25 DIAGNOSIS — M48062 Spinal stenosis, lumbar region with neurogenic claudication: Secondary | ICD-10-CM | POA: Diagnosis not present

## 2017-05-25 DIAGNOSIS — M5136 Other intervertebral disc degeneration, lumbar region: Secondary | ICD-10-CM | POA: Diagnosis not present

## 2017-05-25 LAB — CBC
HCT: 32.7 % — ABNORMAL LOW (ref 36.0–46.0)
HEMOGLOBIN: 10.8 g/dL — AB (ref 12.0–15.0)
MCH: 31 pg (ref 26.0–34.0)
MCHC: 33 g/dL (ref 30.0–36.0)
MCV: 94 fL (ref 78.0–100.0)
Platelets: 253 10*3/uL (ref 150–400)
RBC: 3.48 MIL/uL — ABNORMAL LOW (ref 3.87–5.11)
RDW: 14.5 % (ref 11.5–15.5)
WBC: 6.3 10*3/uL (ref 4.0–10.5)

## 2017-05-25 LAB — CHOLESTEROL, TOTAL: CHOLESTEROL: 211 mg/dL — AB (ref 0–200)

## 2017-05-25 LAB — LIPID PANEL
CHOLESTEROL: 213 mg/dL — AB (ref 0–200)
HDL: 119 mg/dL (ref >40)
LDL Cholesterol: 81 mg/dL (ref 0–99)
TRIGLYCERIDES: 63 mg/dL (ref <150)
Total CHOL/HDL Ratio: 1.8 RATIO
VLDL: 13 mg/dL (ref 0–40)

## 2017-05-25 NOTE — Addendum Note (Signed)
Encounter addended by: Kerry Dory, CMA on: 05/25/2017 10:45 AM<BR>    Actions taken: Visit diagnoses modified, Order list changed, Diagnosis association updated

## 2017-05-26 ENCOUNTER — Encounter (HOSPITAL_COMMUNITY): Payer: Medicare Other

## 2017-05-30 ENCOUNTER — Ambulatory Visit (HOSPITAL_COMMUNITY)
Admit: 2017-05-30 | Discharge: 2017-05-30 | Disposition: A | Discharge status: Home / Self Care | Payer: Medicare Other | Attending: Cardiology | Admitting: Cardiology

## 2017-05-30 ENCOUNTER — Encounter (HOSPITAL_COMMUNITY): Payer: Self-pay | Admitting: Cardiology

## 2017-05-30 ENCOUNTER — Encounter (HOSPITAL_COMMUNITY): Payer: Self-pay | Admitting: *Deleted

## 2017-05-30 ENCOUNTER — Ambulatory Visit: Payer: Medicare Other

## 2017-05-30 VITALS — BP 143/66 | HR 76 | Wt 108.2 lb

## 2017-05-30 DIAGNOSIS — K219 Gastro-esophageal reflux disease without esophagitis: Secondary | ICD-10-CM | POA: Diagnosis not present

## 2017-05-30 DIAGNOSIS — I10 Essential (primary) hypertension: Secondary | ICD-10-CM | POA: Insufficient documentation

## 2017-05-30 DIAGNOSIS — I6529 Occlusion and stenosis of unspecified carotid artery: Secondary | ICD-10-CM | POA: Insufficient documentation

## 2017-05-30 DIAGNOSIS — I2584 Coronary atherosclerosis due to calcified coronary lesion: Secondary | ICD-10-CM | POA: Insufficient documentation

## 2017-05-30 DIAGNOSIS — E785 Hyperlipidemia, unspecified: Secondary | ICD-10-CM | POA: Diagnosis not present

## 2017-05-30 DIAGNOSIS — I2510 Atherosclerotic heart disease of native coronary artery without angina pectoris: Secondary | ICD-10-CM | POA: Insufficient documentation

## 2017-05-30 DIAGNOSIS — E784 Other hyperlipidemia: Secondary | ICD-10-CM | POA: Diagnosis not present

## 2017-05-30 DIAGNOSIS — E7849 Other hyperlipidemia: Secondary | ICD-10-CM

## 2017-05-30 DIAGNOSIS — R011 Cardiac murmur, unspecified: Secondary | ICD-10-CM | POA: Diagnosis not present

## 2017-05-30 DIAGNOSIS — R55 Syncope and collapse: Secondary | ICD-10-CM

## 2017-05-30 DIAGNOSIS — G4733 Obstructive sleep apnea (adult) (pediatric): Secondary | ICD-10-CM | POA: Diagnosis not present

## 2017-05-30 DIAGNOSIS — R001 Bradycardia, unspecified: Secondary | ICD-10-CM | POA: Diagnosis not present

## 2017-05-30 DIAGNOSIS — E860 Dehydration: Secondary | ICD-10-CM | POA: Diagnosis not present

## 2017-05-30 DIAGNOSIS — R918 Other nonspecific abnormal finding of lung field: Secondary | ICD-10-CM | POA: Insufficient documentation

## 2017-05-30 DIAGNOSIS — R251 Tremor, unspecified: Secondary | ICD-10-CM | POA: Diagnosis not present

## 2017-05-30 DIAGNOSIS — E782 Mixed hyperlipidemia: Secondary | ICD-10-CM | POA: Diagnosis not present

## 2017-05-30 DIAGNOSIS — I208 Other forms of angina pectoris: Secondary | ICD-10-CM

## 2017-05-30 DIAGNOSIS — F319 Bipolar disorder, unspecified: Secondary | ICD-10-CM | POA: Diagnosis not present

## 2017-05-30 DIAGNOSIS — I351 Nonrheumatic aortic (valve) insufficiency: Secondary | ICD-10-CM | POA: Insufficient documentation

## 2017-05-30 DIAGNOSIS — Z7982 Long term (current) use of aspirin: Secondary | ICD-10-CM | POA: Insufficient documentation

## 2017-05-30 MED ORDER — AMLODIPINE BESYLATE 10 MG PO TABS: 5.0000 mg | ORAL_TABLET | Freq: Every day | ORAL | 3 refills | Status: DC

## 2017-05-30 MED ORDER — ROSUVASTATIN CALCIUM 40 MG PO TABS: 40.0000 mg | ORAL_TABLET | Freq: Every day | ORAL | 6 refills | Status: DC

## 2017-05-30 NOTE — Progress Notes (Signed)
Patient ID: GAE BIHL, female   DOB: April 18, 1941, 76 y.o.   MRN: [id number] PCP: Dr. Renold Genta Cardiology: Dr. Aundra Dubin  76 yo with history of bipolar disorder, carotid stenosis, HTN, CAD, and hyperlipidemia presents for cardiology followup.  Last echo in 8/18 showed normal EF, mild AS, moderate AI.  She had DES to pLAD in 8/18.  She has severe depression and gets periodic ECT at [facility].   For about 3 years, she has had episodes of burning in her lower neck , sometimes radiating up.  These episodes have no particular trigger (not meals or exercise).  In the past, her family had thought they represented GERD and she has been on a PPI and H-2 blocker as well as Mylanta prn.  She will have some relief but not always complete when she takes Mylanta.  Over the last few months, she has had more of the lower neck burning episodes.  They can last up to an hour at a time and sometimes occur daily.  She had a particularly bad episode in 7/18 and went to the ER.  She had a coronary CTA that showed extensive calcification in the proximal to mid LAD, no definite severe stenosis but cannot rule out.  The study quality was not good enough to interpret by FFR.  Therefore, I took her for coronary angiography in 8/18.  This showed a 90% proximal LAD stenosis that was treated with DES.  Unfortunately, coronary intervention has had no effect on her symptoms.  She continues to have the same periodic burning sensation in chest and lower neck, not exertional.   The day after her cath, she got up and was standing in the bathroom.  She passed out and fell to the ground.  She was unconscious only briefly.  She has had no repetition of this episode.    She has baseline dyspnea walking up a flight of steps. Marked daytime fatigue. Depression is worse recently, needs ECT.  She has had problems recently with her memory.   She is currently not getting her anti-hypertensives as BP at home has not been elevated.  Her home nurse says that she  only needs them when she goes for ECT (gets anxious and BP runs up).    Labs (4/16): K 3.3, creatinine 0.76 Labs (7/18): K 4.4, creatinine 1.02 Labs (8/18): LDL 81, HDL 119, creatinine 0.99, hgb 10.8  PMH: 1. GERD 2. Bipolar disorder: On lithium. History of ECT.  3. HTN 4. Tremor 5. Hyperlipidemia 6. Carotid stenosis: Carotid dopplers (7/14) with 40-59% bilateral ICA stenosis.  Carotid dopplers (7/15) with 40-59% BICA stenosis.  - Carotid dopplers (8/18): Mild BICA stenosis.  7. CAD: Coronary calcium seen on CT.   - ETT (7/13) with 5'45" exercise, 71% MPHR (wanted to stop), no ischemic ECG changes but did not reach target heart rate.  - Lexiscan Cardiolite (4/16) with EF 76%, no ischemia/infarction.  - Coronary CTA (7/18): Interpretation limited by artifact.  Extensive calcification in the proximal to mid LAD, possible 50% stenosis but difficult to quantify due to artifact.   - LHC (8/18): 90% proximal LAD stenosis => DES placed.  8. Aortic valve disorder:  Echo (2/12) with EF 55-60%, mild AI and MR, PA systolic pressure 36 mmHg.  Echo (12/14) with EF 65-70%, mild LVH, mild AI, very mild AS, PA systolic pressure 44 mmHg.  - Echo (8/18) with EF 65-70%, mild AS, moderate aortic insufficiency.  9. Lung nodules: CT chest 12/14 stable, no followup recommended.  10.  Bradycardia: asymptomatic 11. Syncope: ?orthostatic/dehydration, 8/18.   SH: Married to [name], quit smoking in 1972, 2 daughters.  FH: Father with CAD.  ROS: All systems reviewed and negative except as per HPI.   Current Outpatient Prescriptions  Medication Sig Dispense Refill  . acetaminophen (TYLENOL) 500 MG tablet Take 1,000 mg by mouth every 6 (six) hours as needed for mild pain.    Marland Kitchen aspirin 81 MG tablet Take 81 mg by mouth daily.      . Calcium Carbonate-Vitamin D (CALCIUM 600/VITAMIN D PO) Take 1 tablet by mouth 2 (two) times daily.     . cetirizine (ZYRTEC) 10 MG tablet Take 10 mg by mouth 3 times/day as  needed-between meals & bedtime.     . Cholecalciferol (VITAMIN D3) 2000 UNITS TABS Take 4,000 Units by mouth daily.     . clopidogrel (PLAVIX) 75 MG tablet Take 1 tablet (75 mg total) by mouth daily with breakfast. 30 tablet 12  . L-Methylfolate-B12-B6-B2 (CEREFOLIN) 03-04-49-5 MG TABS Take 1 tablet by mouth at bedtime.    Marland Kitchen lithium carbonate (LITHOBID) 300 MG CR tablet Take 150 mg by mouth at bedtime.     Marland Kitchen LORazepam (ATIVAN) 0.5 MG tablet Take 0.5 mg by mouth 3 times/day as needed-between meals & bedtime for anxiety.     . memantine (NAMENDA) 10 MG tablet Take 10 mg by mouth 2 (two) times daily.    . Multiple Vitamins-Minerals (MULTIVITAMIN WITH MINERALS) tablet Take 1 tablet by mouth daily.      . nitroGLYCERIN (NITROSTAT) 0.4 MG SL tablet Place 1 tablet (0.4 mg total) under the tongue every 5 (five) minutes as needed for chest pain. 25 tablet 3  . nortriptyline (PAMELOR) 10 MG capsule Take 10 mg by mouth at bedtime.    Marland Kitchen omega-3 acid ethyl esters (LOVAZA) 1 g capsule Take 1 capsule (1 g total) by mouth daily. 30 capsule 12  . pantoprazole (PROTONIX) 40 MG tablet Take 1 tablet (40 mg total) by mouth 2 (two) times daily before a meal. 60 tablet 3  . PARoxetine (PAXIL) 20 MG tablet Take 20 mg by mouth at bedtime.     . primidone (MYSOLINE) 50 MG tablet 2 tabs AM and 1 tab QHS 270 tablet 1  . Pyridoxine HCl (VITAMIN B-6) 500 MG tablet Take 500 mg by mouth 2 (two) times daily.    . rosuvastatin (CRESTOR) 40 MG tablet Take 1 tablet (40 mg total) by mouth daily. 30 tablet 6  . TOVIAZ 8 MG TB24 tablet TAKE 1 TABLET BY MOUTH DAILY 90 tablet 3  . vitamin B-12 (CYANOCOBALAMIN) 1000 MCG tablet Take 1,000 mcg by mouth at bedtime.    Marland Kitchen amLODipine (NORVASC) 10 MG tablet Take 0.5 tablets (5 mg total) by mouth daily. 90 tablet 3  . irbesartan (AVAPRO) 150 MG tablet Take 150 mg by mouth daily.     Marland Kitchen levothyroxine (SYNTHROID, LEVOTHROID) 75 MCG tablet TAKE 1 TABLET(75 MCG) BY MOUTH DAILY 90 tablet 1  .  spironolactone (ALDACTONE) 25 MG tablet Take 0.5 tablets (12.5 mg total) by mouth daily. (Patient not taking: Reported on 05/30/2017) 45 tablet 3   No current facility-administered medications for this encounter.     BP (!) 143/66   Pulse 76   Wt 108 lb 4 oz (49.1 kg)   LMP  (LMP Unknown)   SpO2 100%   BMI 19.80 kg/m  General: NAD Neck: No JVD, no thyromegaly or thyroid nodule.  Lungs: Clear to auscultation bilaterally with  normal respiratory effort. CV: Nondisplaced PMI.  Heart regular S1/S2, no S3/S4, 2/6 early SEM RUSB.  No peripheral edema.  No carotid bruit.  Normal pedal pulses.  Abdomen: Soft, nontender, no hepatosplenomegaly, no distention.  Skin: Intact without lesions or rashes.  Neurologic: Alert and oriented x 3.  Psych: Flat affect, family answers most of questions. Extremities: No clubbing or cyanosis.  HEENT: Normal.    Assessment/Plan: 1. Carotid stenosis: Mild disease only on recent carotid dopplers.   2. CAD: Atypical pain (neck burning, nonexertional) for about 3 years but worse recently.  However, she has a concerning coronary CTA with extensive calcified plaque in the proximal to mid LAD. Due to artifact, hard to tell degree of stenosis and the study was not good enough to get FFR from it.  I was concerned that her symptoms could represent angina given the amount of coronary plaque she has => coronary angiography was done, showing tight proximal LAD stenosis (8/18).  This was treated with DES.  PCI did not help her symptoms (neck and chest burning).  This makes me suspect that her symptoms are GERD.  - Continue ASA 81 and statin.  - Refer to cardiac rehab.  3. Aortic valve disorder: Mild AS, moderate AI on echo in 8/18.  Follow over time.   4. Bradycardia: Mild, asymptomatic.  Avoid nodal blocking agents.   5. HTN: Labile, fluctuates with anxiety level.  Currently, family is not giving her any antihypertensives.   - I think that she does need a standing  antihypertensive, I will have her restart amlodipine 5 mg daily. Her home nurse will continue to monitor her BP.  6. Hyperlipidemia: Goal LDL < 70, mildly above goal.  Increase Crestor to 40 mg daily with lipids/LFTs in 2 months.  7. Syncope: This occurred the day after cath in 8/18.  Hemoglobin is stable.  No further episodes now that she is off antihypertensives.  I suspect that this was orthostatic-related in setting of dehydration and antihypertensive meds.  We discussed event monitor, but family does not think she can manage this with her current level of anxiety.  8. GERD: Chest/neck symptoms did not change with PCI.  I suspect GERD is causing her symptoms.  She has appointment with GI on Wednesday.  9. Depression: Profound.  I think that she is stable to resume ECT.    Followup in 2 months.   Loralie Champagne 05/30/2017

## 2017-05-30 NOTE — Patient Instructions (Signed)
Restart Amlodipine at 5 mg daily  Increase Crestor to 40 mg daily  Your physician recommends that you return for a FASTING lipid profile: 2 months  Your physician has recommended that you have a sleep study. This test records several body functions during sleep, including: brain activity, eye movement, oxygen and carbon dioxide blood levels, heart rate and rhythm, breathing rate and rhythm, the flow of air through your mouth and nose, snoring, body muscle movements, and chest and belly movement.  You have been referred to Cardiac Rehab, they will call you to schedule  Your physician recommends that you schedule a follow-up appointment in: 2 months

## 2017-05-31 ENCOUNTER — Telehealth (HOSPITAL_COMMUNITY): Payer: Self-pay | Admitting: *Deleted

## 2017-05-31 ENCOUNTER — Other Ambulatory Visit (HOSPITAL_COMMUNITY): Payer: Self-pay | Admitting: Cardiology

## 2017-05-31 NOTE — Telephone Encounter (Signed)
Patient's caregiver called saying that patient has a dental cleaning this afternoon and asked if she needed an antibiotic prior.  Per Dr. Aundra Dubin no antibiotic is needed.  No further questions.

## 2017-06-01 ENCOUNTER — Encounter: Payer: Self-pay | Admitting: Gastroenterology

## 2017-06-01 ENCOUNTER — Ambulatory Visit (INDEPENDENT_AMBULATORY_CARE_PROVIDER_SITE_OTHER): Payer: Medicare Other | Admitting: Gastroenterology

## 2017-06-01 VITALS — BP 102/68 | HR 66 | Ht 58.5 in | Wt 103.5 lb

## 2017-06-01 DIAGNOSIS — K5909 Other constipation: Secondary | ICD-10-CM | POA: Diagnosis not present

## 2017-06-01 DIAGNOSIS — K219 Gastro-esophageal reflux disease without esophagitis: Secondary | ICD-10-CM | POA: Diagnosis not present

## 2017-06-01 DIAGNOSIS — R12 Heartburn: Secondary | ICD-10-CM

## 2017-06-01 DIAGNOSIS — I208 Other forms of angina pectoris: Secondary | ICD-10-CM

## 2017-06-01 MED ORDER — RANITIDINE HCL 150 MG PO TABS: 150.0000 mg | ORAL_TABLET | Freq: Two times a day (BID) | ORAL | 3 refills | Status: DC

## 2017-06-01 NOTE — Patient Instructions (Signed)
We have given you samples of Linzess 145 and Linzess 290 to try, let us know which one you need as a prescription  We will send Zantac to your pharmacy  Use FDGard 1-2 capsules three times a day as needed  Follow up in 3 months

## 2017-06-01 NOTE — Progress Notes (Signed)
Meghan Oliver    [id number]    12/02/1940  Primary Care Physician:Baxley, Cresenciano Lick, MD  Referring Physician: [name], MD [address] [place], [place] [address]  Chief complaint: Heartburn, Constipation  HPI:-76 year-old female with bipolar disorder, hiatal hernia, GERD, CAD status post DS 2 proximal LAD on August 18 here for follow-up visit accompanied by her husband Dr. Lyla Oliver and personal RN Meghan Oliver.  According to Meghan Oliver and Meghan Oliver, Meghan Oliver is having memory lapse and worsening depression. Plan to undergo ECT at [facility] next month. She has tried multiple PPI with no significant improvement. She has some improvement of heartburn with Zantac at bedtime. Chronic constipation, improves with Dulcolax and also Linzess patient is reluctant to take it on daily basis as she is worried about having episodes of diarrhea or fecal incontinence. No nausea, vomiting, abdominal pain, melena or blood per rectum.   Outpatient Encounter Prescriptions as of 06/01/2017  Medication Sig  . acetaminophen (TYLENOL) 500 MG tablet Take 1,000 mg by mouth every 6 (six) hours as needed for mild pain.  Marland Kitchen amLODipine (NORVASC) 10 MG tablet Take 0.5 tablets (5 mg total) by mouth daily.  Marland Kitchen aspirin 81 MG tablet Take 81 mg by mouth daily.    . Calcium Carbonate-Vitamin D (CALCIUM 600/VITAMIN D PO) Take 1 tablet by mouth 2 (two) times daily.   . cetirizine (ZYRTEC) 10 MG tablet Take 10 mg by mouth 3 times/day as needed-between meals & bedtime.   . Cholecalciferol (VITAMIN D3) 2000 UNITS TABS Take 4,000 Units by mouth daily.   . clopidogrel (PLAVIX) 75 MG tablet Take 1 tablet (75 mg total) by mouth daily with breakfast.  . L-Methylfolate-B12-B6-B2 (CEREFOLIN) 03-04-49-5 MG TABS Take 1 tablet by mouth at bedtime.  Marland Kitchen levothyroxine (SYNTHROID, LEVOTHROID) 75 MCG tablet TAKE 1 TABLET(75 MCG) BY MOUTH DAILY  . lithium carbonate (LITHOBID) 300 MG CR tablet Take 150 mg by mouth at bedtime.   Marland Kitchen LORazepam  (ATIVAN) 0.5 MG tablet Take 0.5 mg by mouth 3 times/day as needed-between meals & bedtime for anxiety.   . memantine (NAMENDA) 10 MG tablet Take 10 mg by mouth 2 (two) times daily.  . Multiple Vitamins-Minerals (MULTIVITAMIN WITH MINERALS) tablet Take 1 tablet by mouth daily.    . nitroGLYCERIN (NITROSTAT) 0.4 MG SL tablet Place 1 tablet (0.4 mg total) under the tongue every 5 (five) minutes as needed for chest pain.  . nortriptyline (PAMELOR) 10 MG capsule Take 10 mg by mouth at bedtime.  Marland Kitchen omega-3 acid ethyl esters (LOVAZA) 1 g capsule Take 1 capsule (1 g total) by mouth daily.  . pantoprazole (PROTONIX) 40 MG tablet Take 1 tablet (40 mg total) by mouth 2 (two) times daily before a meal.  . PARoxetine (PAXIL) 20 MG tablet Take 20 mg by mouth at bedtime.   . primidone (MYSOLINE) 50 MG tablet 2 tabs AM and 1 tab QHS  . Pyridoxine HCl (VITAMIN B-6) 500 MG tablet Take 500 mg by mouth 2 (two) times daily.  . ranitidine (ZANTAC) 150 MG capsule Take 150 mg by mouth at bedtime.  . rosuvastatin (CRESTOR) 40 MG tablet Take 1 tablet (40 mg total) by mouth daily.  . TOVIAZ 8 MG TB24 tablet TAKE 1 TABLET BY MOUTH DAILY  . vitamin B-12 (CYANOCOBALAMIN) 1000 MCG tablet Take 1,000 mcg by mouth at bedtime.  . irbesartan (AVAPRO) 150 MG tablet Take 150 mg by mouth daily.   Marland Kitchen spironolactone (  ALDACTONE) 25 MG tablet Take 0.5 tablets (12.5 mg total) by mouth daily. (Patient not taking: Reported on 06/01/2017)   No facility-administered encounter medications on file as of 06/01/2017.     Allergies as of 06/01/2017 - Review Complete 05/30/2017  Allergen Reaction Noted  . Propranolol Other (See Comments) 08/06/2013  . Brexpiprazole Other (See Comments) 11/27/2015    Past Medical History:  Diagnosis Date  . Anxiety   . Arthritis    "hips, spine" (05/16/2017)  . Bipolar II disorder ([place])   . Chronic bronchitis ([place])   . Chronic lower back pain   . Chronic right hip pain   . CKD (chronic kidney disease),  stage II   . Esophagitis, erosive   . GAD (generalized anxiety disorder)   . GERD (gastroesophageal reflux disease)   . Headache    "weekly" (05/16/2017)  . Heart murmur, systolic   . History of adenomatous polyp of colon    08-04-2016  tubular adenoma  . History of electroconvulsive therapy    at [facility]--  started 04-15-2015 to 11-17-2016  total greater than 40 times  . History of hiatal hernia   . Hyperlipidemia   . Hypertension   . Hypothyroidism   . Internal carotid artery stenosis, bilateral    per last duplex 05-01-2014  bilateral ICA 40-59%  . Major depression, chronic    ECT treatments extensive and multiple started 07/ 2016  . Memory loss    "both short and long-term; needs frequent reminders to follow instrucitons" (05/16/2017)  . Migraines    "none in years" (05/16/2017)  . OSA (obstructive sleep apnea)    per study 06/ 2012 moderate OSA    . Osteoporosis   . Pulmonary nodule    monitored by pcp    Past Surgical History:  Procedure Laterality Date  . APPENDECTOMY  1971  . AUGMENTATION MAMMAPLASTY Bilateral   . CARDIOVASCULAR STRESS TEST  01-30-2015  dr Aundra Dubin   Low risk nuclear study w/ no evidence ischemia or infarction/  normal LV funciton and wall motion , 76%  . COLONOSCOPY W/ BIOPSIES AND POLYPECTOMY  "multiple"  . COLONOSCOPY WITH ESOPHAGOGASTRODUODENOSCOPY (EGD)  last one 08-04-2016  . CORONARY ANGIOPLASTY WITH STENT PLACEMENT  05/16/2017   "LAD"  . CORONARY STENT INTERVENTION N/A 05/16/2017   Procedure: CORONARY STENT INTERVENTION;  Surgeon: Burnell Blanks, MD;  Location: [place] [facility];  Service: Cardiovascular;  Laterality: N/A;  . ESOPHAGOGASTRODUODENOSCOPY  02-26-04  . ESOPHAGOGASTRODUODENOSCOPY  "multiple"  . LEFT HEART CATH AND CORONARY ANGIOGRAPHY N/A 05/16/2017   Procedure: LEFT HEART CATH AND CORONARY ANGIOGRAPHY;  Surgeon: Larey Dresser, MD;  Location: [place] [facility];  Service: Cardiovascular;  Laterality: N/A;  . OVARIAN CYST  SURGERY  1970s   Laparotomy   . PORT-A-CATH PLACEMENT  05/31/2016   "@ [facility]; for ECT series"  . PORT-A-CATH REMOVAL N/A 03/11/2017   Procedure: REMOVAL PORT-A-CATH;  Surgeon: Jackolyn Confer, MD;  Location: [facility];  Service: General;  Laterality: N/A;  . TRANSTHORACIC ECHOCARDIOGRAM  09/05/2013  dr Aundra Dubin   mild LVH, ef 44-01%, grade 1 diastolic dysfunction/  very mild AV stenosis with mild AR/  trivial MR and PT/ mild to moderate LAE/ mild TR/ mild pulmonary hypertension with PA peak pressure 63mmHg  . TUBAL LIGATION    . VAGINAL HYSTERECTOMY     "partial"    Family History  Problem Relation Age of Onset  . Heart attack Father 61       deceased  .  Hypertension Father   . Heart disease Father   . Breast cancer Paternal Grandmother        Age unknown  . Breast cancer Paternal Aunt        Age 64's  . Colon cancer Neg Hx     Social History   Social History  . Marital status: Married    Spouse name: Dr. Lyla Oliver  . Number of children: 2  . Years of education: N/A   Occupational History  . housewife Unemployed   Social History Main Topics  . Smoking status: Former Smoker    Packs/day: 2.00    Years: 15.00    Types: Cigarettes    Quit date: 01/13/1971  . Smokeless tobacco: Never Used  . Alcohol use 0.0 oz/week     Comment: 05/16/2017 "nothing in years"  . Drug use: No  . Sexual activity: Not Currently     Comment: intercourse age 3, sexual partners less than 5   Other Topics Concern  . Not on file   Social History Narrative  . No narrative on file      Review of systems: Review of Systems  Constitutional: Negative for fever and chills.  HENT: Negative.   Eyes: Negative for blurred vision.  Respiratory: Negative for cough, shortness of breath and wheezing.   Cardiovascular: Negative for chest pain and palpitations.  Gastrointestinal: as per HPI Genitourinary: Negative for dysuria, urgency, frequency and hematuria.  Musculoskeletal:  Negative for myalgias, and joint pain.  positive for back pain Skin: Negative for itching and rash.  Neurological: Negative for dizziness, tremors, focal weakness, seizures and loss of consciousness.  Endo/Heme/Allergies: Positive for seasonal allergies.  Psychiatric/Behavioral: Negative for suicidal ideas and hallucinations.  positive for severe depression, short-term and long-term memory loss All other systems reviewed and are negative.   Physical Exam: Vitals:   06/01/17 1018  BP: 102/68  Pulse: 66   Body mass index is 21.26 kg/m. Gen:      No acute distress HEENT:  EOMI, sclera anicteric Neck:     No masses; no thyromegaly Lungs:    Clear to auscultation bilaterally; normal respiratory effort CV:         Regular rate and rhythm; no murmurs Abd:      + bowel sounds; soft, non-tender; no palpable masses, no distension Ext:    No edema; adequate peripheral perfusion Skin:      Warm and dry; no rash Neuro: alert and oriented x 3 Psych: normal mood and affect  Data Reviewed:  Reviewed labs, radiology imaging, old records and pertinent past GI work up   Assessment and Plan/Recommendations:  76 year old female with history of hypertension, bilateral carotid stenosis, CAD status post drug-eluting stent to proximal LAD 8/18 here with complaints of persistent heartburn Patient has hiatal hernia and GERD. Functional complement and severe depression likely playing a role in exacerbation of heartburn, given lack of response to high-dose PPI Switch to H2 blocker Zantac 150-300 mg twice daily FD Gard 1-2 capsules TID as needed  Constipation: start Linzess 161mcg daily, if no response increase in 242mcg daily in 1 week Reassured patient and encouraged her to increase fluid intake Small frequent meals  25 minutes was spent face-to-face with the patient. Greater than 50% of the time used for counseling as well as treatment plan and follow-up. She had multiple questions which were  answered to her satisfaction  K. Denzil Magnuson , MD [phone] Mon-Fri 8a-5p [phone] after 5p, weekends, holidays  CC: Tedra Senegal  J, MD   

## 2017-06-03 ENCOUNTER — Telehealth: Payer: Self-pay | Admitting: Cardiology

## 2017-06-03 DIAGNOSIS — I10 Essential (primary) hypertension: Secondary | ICD-10-CM | POA: Diagnosis not present

## 2017-06-03 DIAGNOSIS — K219 Gastro-esophageal reflux disease without esophagitis: Secondary | ICD-10-CM | POA: Diagnosis not present

## 2017-06-03 NOTE — Telephone Encounter (Signed)
talked with the patients husband and he advised that he talked with Dr Aundra Dubin and they cancelled the event monitor.

## 2017-06-06 ENCOUNTER — Other Ambulatory Visit: Payer: Self-pay | Admitting: Cardiology

## 2017-06-07 ENCOUNTER — Other Ambulatory Visit (HOSPITAL_COMMUNITY): Payer: Self-pay

## 2017-06-07 ENCOUNTER — Telehealth (HOSPITAL_COMMUNITY): Payer: Self-pay | Admitting: *Deleted

## 2017-06-07 DIAGNOSIS — R55 Syncope and collapse: Secondary | ICD-10-CM

## 2017-06-07 NOTE — Progress Notes (Signed)
Lexiscan stress test ordered at [facility] per Dr. Claris Gladden VO for patient to be able to resume ECT for depression. Patient's husband aware and agreeable to proceed.  Renee Pain, RN

## 2017-06-07 NOTE — Telephone Encounter (Signed)
Patient given detailed instructions per Myocardial Perfusion Study Information Sheet for the test on  06/08/17 Patient notified to arrive 15 minutes early and that it is imperative to arrive on time for appointment to keep from having the test rescheduled.  If you need to cancel or reschedule your appointment, please call the office within 24 hours of your appointment. . Patient verbalized understanding. Jeannia Tatro Jacqueline    

## 2017-06-07 NOTE — Telephone Encounter (Signed)
Yes event monitor was cancelled by Dr Aundra Dubin

## 2017-06-08 ENCOUNTER — Telehealth (HOSPITAL_COMMUNITY): Payer: Self-pay

## 2017-06-08 ENCOUNTER — Ambulatory Visit (HOSPITAL_COMMUNITY): Payer: Medicare Other | Attending: Cardiology

## 2017-06-08 DIAGNOSIS — R55 Syncope and collapse: Secondary | ICD-10-CM | POA: Insufficient documentation

## 2017-06-08 LAB — MYOCARDIAL PERFUSION IMAGING
CHL CUP NUCLEAR SDS: 11
CHL CUP RESTING HR STRESS: 60 {beats}/min
CSEPPHR: 71 {beats}/min
LVDIAVOL: 68 mL (ref 46–106)
LVSYSVOL: 17 mL
RATE: 0.27
SRS: 6
SSS: 17
TID: 0.99

## 2017-06-08 MED ORDER — TECHNETIUM TC 99M TETROFOSMIN IV KIT
10.2000 | PACK | Freq: Once | INTRAVENOUS | Status: AC | PRN
  Administered 2017-06-08: 10.2 via INTRAVENOUS
  Filled 2017-06-08: qty 11

## 2017-06-08 MED ORDER — TECHNETIUM TC 99M TETROFOSMIN IV KIT
31.6000 | PACK | Freq: Once | INTRAVENOUS | Status: AC | PRN
  Administered 2017-06-08: 31.6 via INTRAVENOUS
  Filled 2017-06-08: qty 32

## 2017-06-08 MED ORDER — REGADENOSON 0.4 MG/5ML IV SOLN
0.4000 mg | Freq: Once | INTRAVENOUS | Status: AC
  Administered 2017-06-08: 0.4 mg via INTRAVENOUS

## 2017-06-08 NOTE — Telephone Encounter (Signed)
Ok to refill per Dr Aundra Dubin

## 2017-06-08 NOTE — Telephone Encounter (Signed)
Should patient continue avapro? Seen it was held at one point but no order to restart

## 2017-06-08 NOTE — Telephone Encounter (Signed)
I called and spoke to patient husband Meghan Oliver about scheduling for cardiac rehab. At this time he would like to wait and see what the results are from testing. Meghan Oliver will contact me when ready to schedule.

## 2017-06-13 DIAGNOSIS — F333 Major depressive disorder, recurrent, severe with psychotic symptoms: Secondary | ICD-10-CM | POA: Diagnosis not present

## 2017-06-13 DIAGNOSIS — Z79899 Other long term (current) drug therapy: Secondary | ICD-10-CM | POA: Diagnosis not present

## 2017-06-17 DIAGNOSIS — F3341 Major depressive disorder, recurrent, in partial remission: Secondary | ICD-10-CM | POA: Diagnosis not present

## 2017-06-21 ENCOUNTER — Encounter (HOSPITAL_COMMUNITY): Admission: RE | Admit: 2017-06-21 | Payer: Medicare Other | Source: Ambulatory Visit

## 2017-06-22 DIAGNOSIS — I998 Other disorder of circulatory system: Secondary | ICD-10-CM | POA: Diagnosis not present

## 2017-06-22 DIAGNOSIS — F332 Major depressive disorder, recurrent severe without psychotic features: Secondary | ICD-10-CM | POA: Diagnosis not present

## 2017-06-22 DIAGNOSIS — Z87891 Personal history of nicotine dependence: Secondary | ICD-10-CM | POA: Diagnosis not present

## 2017-06-24 DIAGNOSIS — I2510 Atherosclerotic heart disease of native coronary artery without angina pectoris: Secondary | ICD-10-CM | POA: Diagnosis not present

## 2017-06-24 DIAGNOSIS — F418 Other specified anxiety disorders: Secondary | ICD-10-CM | POA: Diagnosis not present

## 2017-06-24 DIAGNOSIS — I10 Essential (primary) hypertension: Secondary | ICD-10-CM | POA: Diagnosis not present

## 2017-06-24 DIAGNOSIS — F332 Major depressive disorder, recurrent severe without psychotic features: Secondary | ICD-10-CM | POA: Diagnosis not present

## 2017-06-24 DIAGNOSIS — E039 Hypothyroidism, unspecified: Secondary | ICD-10-CM | POA: Diagnosis not present

## 2017-06-27 DIAGNOSIS — F319 Bipolar disorder, unspecified: Secondary | ICD-10-CM | POA: Diagnosis not present

## 2017-06-27 DIAGNOSIS — F314 Bipolar disorder, current episode depressed, severe, without psychotic features: Secondary | ICD-10-CM | POA: Diagnosis not present

## 2017-06-29 ENCOUNTER — Encounter (HOSPITAL_COMMUNITY): Payer: Self-pay

## 2017-06-29 DIAGNOSIS — Z87891 Personal history of nicotine dependence: Secondary | ICD-10-CM | POA: Diagnosis not present

## 2017-06-29 DIAGNOSIS — Z452 Encounter for adjustment and management of vascular access device: Secondary | ICD-10-CM | POA: Diagnosis not present

## 2017-06-29 DIAGNOSIS — Z7982 Long term (current) use of aspirin: Secondary | ICD-10-CM | POA: Diagnosis not present

## 2017-06-29 DIAGNOSIS — F314 Bipolar disorder, current episode depressed, severe, without psychotic features: Secondary | ICD-10-CM | POA: Diagnosis not present

## 2017-06-29 DIAGNOSIS — F332 Major depressive disorder, recurrent severe without psychotic features: Secondary | ICD-10-CM | POA: Diagnosis not present

## 2017-06-29 DIAGNOSIS — Z79899 Other long term (current) drug therapy: Secondary | ICD-10-CM | POA: Diagnosis not present

## 2017-07-01 DIAGNOSIS — F314 Bipolar disorder, current episode depressed, severe, without psychotic features: Secondary | ICD-10-CM | POA: Diagnosis not present

## 2017-07-04 DIAGNOSIS — F332 Major depressive disorder, recurrent severe without psychotic features: Secondary | ICD-10-CM | POA: Diagnosis not present

## 2017-07-04 DIAGNOSIS — F314 Bipolar disorder, current episode depressed, severe, without psychotic features: Secondary | ICD-10-CM | POA: Diagnosis not present

## 2017-07-04 DIAGNOSIS — F322 Major depressive disorder, single episode, severe without psychotic features: Secondary | ICD-10-CM | POA: Diagnosis not present

## 2017-07-06 ENCOUNTER — Ambulatory Visit: Payer: Medicare Other | Admitting: Neurology

## 2017-07-06 DIAGNOSIS — I2510 Atherosclerotic heart disease of native coronary artery without angina pectoris: Secondary | ICD-10-CM | POA: Diagnosis not present

## 2017-07-06 DIAGNOSIS — F314 Bipolar disorder, current episode depressed, severe, without psychotic features: Secondary | ICD-10-CM | POA: Diagnosis not present

## 2017-07-06 DIAGNOSIS — I10 Essential (primary) hypertension: Secondary | ICD-10-CM | POA: Diagnosis not present

## 2017-07-08 DIAGNOSIS — F332 Major depressive disorder, recurrent severe without psychotic features: Secondary | ICD-10-CM | POA: Diagnosis not present

## 2017-07-08 DIAGNOSIS — F314 Bipolar disorder, current episode depressed, severe, without psychotic features: Secondary | ICD-10-CM | POA: Diagnosis not present

## 2017-07-08 DIAGNOSIS — I10 Essential (primary) hypertension: Secondary | ICD-10-CM | POA: Diagnosis not present

## 2017-07-08 DIAGNOSIS — I2510 Atherosclerotic heart disease of native coronary artery without angina pectoris: Secondary | ICD-10-CM | POA: Diagnosis not present

## 2017-07-11 ENCOUNTER — Ambulatory Visit: Payer: Medicare Other | Admitting: Neurology

## 2017-07-11 DIAGNOSIS — F332 Major depressive disorder, recurrent severe without psychotic features: Secondary | ICD-10-CM | POA: Diagnosis not present

## 2017-07-11 DIAGNOSIS — F314 Bipolar disorder, current episode depressed, severe, without psychotic features: Secondary | ICD-10-CM | POA: Diagnosis not present

## 2017-07-13 ENCOUNTER — Emergency Department (HOSPITAL_COMMUNITY)
Admission: EM | Admit: 2017-07-13 | Discharge: 2017-07-13 | Disposition: A | Discharge status: Home / Self Care | Payer: Medicare Other | Attending: Emergency Medicine | Admitting: Emergency Medicine

## 2017-07-13 ENCOUNTER — Encounter (HOSPITAL_COMMUNITY): Payer: Self-pay | Admitting: Emergency Medicine

## 2017-07-13 DIAGNOSIS — I10 Essential (primary) hypertension: Secondary | ICD-10-CM | POA: Diagnosis not present

## 2017-07-13 DIAGNOSIS — T434X1A Poisoning by butyrophenone and thiothixene neuroleptics, accidental (unintentional), initial encounter: Secondary | ICD-10-CM | POA: Diagnosis not present

## 2017-07-13 DIAGNOSIS — E039 Hypothyroidism, unspecified: Secondary | ICD-10-CM | POA: Diagnosis not present

## 2017-07-13 DIAGNOSIS — T43591A Poisoning by other antipsychotics and neuroleptics, accidental (unintentional), initial encounter: Secondary | ICD-10-CM | POA: Insufficient documentation

## 2017-07-13 DIAGNOSIS — T43021A Poisoning by tetracyclic antidepressants, accidental (unintentional), initial encounter: Secondary | ICD-10-CM | POA: Diagnosis not present

## 2017-07-13 DIAGNOSIS — T43011A Poisoning by tricyclic antidepressants, accidental (unintentional), initial encounter: Secondary | ICD-10-CM | POA: Diagnosis not present

## 2017-07-13 DIAGNOSIS — I259 Chronic ischemic heart disease, unspecified: Secondary | ICD-10-CM | POA: Diagnosis not present

## 2017-07-13 DIAGNOSIS — Z79899 Other long term (current) drug therapy: Secondary | ICD-10-CM | POA: Diagnosis not present

## 2017-07-13 DIAGNOSIS — Z955 Presence of coronary angioplasty implant and graft: Secondary | ICD-10-CM | POA: Insufficient documentation

## 2017-07-13 DIAGNOSIS — R9431 Abnormal electrocardiogram [ECG] [EKG]: Secondary | ICD-10-CM | POA: Diagnosis not present

## 2017-07-13 DIAGNOSIS — Z87891 Personal history of nicotine dependence: Secondary | ICD-10-CM | POA: Diagnosis not present

## 2017-07-13 DIAGNOSIS — T50901A Poisoning by unspecified drugs, medicaments and biological substances, accidental (unintentional), initial encounter: Secondary | ICD-10-CM

## 2017-07-13 DIAGNOSIS — T56891A Toxic effect of other metals, accidental (unintentional), initial encounter: Secondary | ICD-10-CM | POA: Diagnosis not present

## 2017-07-13 LAB — RAPID URINE DRUG SCREEN, HOSP PERFORMED
AMPHETAMINES: NOT DETECTED
BENZODIAZEPINES: POSITIVE — AB
Barbiturates: POSITIVE — AB
Cocaine: NOT DETECTED
OPIATES: NOT DETECTED
TETRAHYDROCANNABINOL: NOT DETECTED

## 2017-07-13 LAB — BASIC METABOLIC PANEL
ANION GAP: 6 (ref 5–15)
BUN: 19 mg/dL (ref 6–20)
CHLORIDE: 108 mmol/L (ref 101–111)
CO2: 22 mmol/L (ref 22–32)
Calcium: 9.1 mg/dL (ref 8.9–10.3)
Creatinine, Ser: 0.77 mg/dL (ref 0.44–1.00)
GFR calc Af Amer: >60 mL/min (ref >60)
GFR calc non Af Amer: >60 mL/min (ref >60)
GLUCOSE: 88 mg/dL (ref 65–99)
Potassium: 3.6 mmol/L (ref 3.5–5.1)
Sodium: 136 mmol/L (ref 135–145)

## 2017-07-13 LAB — CBC
HCT: 32.4 % — ABNORMAL LOW (ref 36.0–46.0)
HEMOGLOBIN: 10.8 g/dL — AB (ref 12.0–15.0)
MCH: 31.7 pg (ref 26.0–34.0)
MCHC: 33.3 g/dL (ref 30.0–36.0)
MCV: 95 fL (ref 78.0–100.0)
PLATELETS: 215 10*3/uL (ref 150–400)
RBC: 3.41 MIL/uL — AB (ref 3.87–5.11)
RDW: 12.3 % (ref 11.5–15.5)
WBC: 5.9 10*3/uL (ref 4.0–10.5)

## 2017-07-13 LAB — COMPREHENSIVE METABOLIC PANEL
ALBUMIN: 3.6 g/dL (ref 3.5–5.0)
ALT: 59 U/L — ABNORMAL HIGH (ref 14–54)
AST: 32 U/L (ref 15–41)
Alkaline Phosphatase: 40 U/L (ref 38–126)
Anion gap: 10 (ref 5–15)
BUN: 24 mg/dL — AB (ref 6–20)
CHLORIDE: 104 mmol/L (ref 101–111)
CO2: 22 mmol/L (ref 22–32)
Calcium: 9.3 mg/dL (ref 8.9–10.3)
Creatinine, Ser: 0.75 mg/dL (ref 0.44–1.00)
GFR calc Af Amer: >60 mL/min (ref >60)
Glucose, Bld: 101 mg/dL — ABNORMAL HIGH (ref 65–99)
POTASSIUM: 4 mmol/L (ref 3.5–5.1)
SODIUM: 136 mmol/L (ref 135–145)
Total Bilirubin: 0.2 mg/dL — ABNORMAL LOW (ref 0.3–1.2)
Total Protein: 6.2 g/dL — ABNORMAL LOW (ref 6.5–8.1)

## 2017-07-13 LAB — SALICYLATE LEVEL

## 2017-07-13 LAB — CBG MONITORING, ED: Glucose-Capillary: 96 mg/dL (ref 65–99)

## 2017-07-13 LAB — LITHIUM LEVEL
Lithium Lvl: 0.59 mmol/L — ABNORMAL LOW (ref 0.60–1.20)
Lithium Lvl: 0.45 mmol/L — ABNORMAL LOW (ref 0.60–1.20)

## 2017-07-13 LAB — ACETAMINOPHEN LEVEL: Acetaminophen (Tylenol), Serum: <10 ug/mL — ABNORMAL LOW (ref 10–30)

## 2017-07-13 LAB — ETHANOL

## 2017-07-13 MED ORDER — SODIUM CHLORIDE 0.9 % IV BOLUS (SEPSIS)
1000.0000 mL | Freq: Once | INTRAVENOUS | Status: AC
  Administered 2017-07-13: 1000 mL via INTRAVENOUS

## 2017-07-13 MED ORDER — HEPARIN SOD (PORK) LOCK FLUSH 100 UNIT/ML IV SOLN
500.0000 [IU] | Freq: Once | INTRAVENOUS | Status: AC
  Administered 2017-07-13: 500 [IU]
  Filled 2017-07-13: qty 5

## 2017-07-13 NOTE — ED Triage Notes (Signed)
Pt brought from home with home care RN who reports patient had an unintentional overdose of all meds last night. RN reports that patient took all her day/ morning meds and three days of evening meds all in a 12 hour time period since sometime last evening. RN reports patient has moderate confusion at baseline but confusion was severe this morning when she arrived at 10am. Psychiatrist was notified and he advised RN to bring to ED dur to OD of Lithium 900mg , Paxil 60mg , Nortriptyline 225, Mirtazapine 12.5 and Lorazepam 5mg . RN also reports patient had difficulty ambulating and that she has become more alert since this morning. Pt has a port in R chest.

## 2017-07-13 NOTE — ED Notes (Signed)
ED Provider at bedside. 

## 2017-07-13 NOTE — ED Notes (Signed)
Pt offered fluids and actively drinking them. Tolerating well.

## 2017-07-13 NOTE — ED Provider Notes (Signed)
Patient signed out to me by Dr. Venora Maples and lithium level has decreased and much less are stable. Will be discharged home   Lacretia Leigh, MD 07/13/17 1945

## 2017-07-13 NOTE — ED Notes (Signed)
Bed: ID39 Expected date:  Expected time:  Means of arrival:  Comments: Hold triage 1

## 2017-07-13 NOTE — ED Provider Notes (Signed)
[facility] DEPT Provider Note   CSN: [id number] Arrival date & time: 07/13/17  1229     History   Chief Complaint Chief Complaint  Patient presents with  . unintentional overdose   Level V caveat: Mental status changes, memory loss   HPI Meghan Oliver is a 76 y.o. female.  HPI Patient is a 76 year old female who presents emergency department after accidental ingestion of her nighttime meds 3.  She took the below total medications.   Lithium 900mg  Paxil 60mg  Nortrip 225mg  Mirtazapine 12.5 Lorazepam 5mg   She was found altered and minimally responsive by her home nurse today and was brought to the ER for evaluation.  She's had improvement in her mental status since being found this morning approximately 9:30 AM altered.  The home nurse is most concerned about her extra lithium and TCA medications.  Patient has been known to intermittently drink.  She does not believe she is drinking alcohol at this time.  She is back to receiving intermittent ECT for severe depression.   Past Medical History:  Diagnosis Date  . Anxiety   . Arthritis    "hips, spine" (05/16/2017)  . Bipolar II disorder ([place])   . Chronic bronchitis ([place])   . Chronic lower back pain   . Chronic right hip pain   . CKD (chronic kidney disease), stage II   . Esophagitis, erosive   . GAD (generalized anxiety disorder)   . GERD (gastroesophageal reflux disease)   . Headache    "weekly" (05/16/2017)  . Heart murmur, systolic   . History of adenomatous polyp of colon    08-04-2016  tubular adenoma  . History of electroconvulsive therapy    at [facility]--  started 04-15-2015 to 11-17-2016  total greater than 40 times  . History of hiatal hernia   . Hyperlipidemia   . Hypertension   . Hypothyroidism   . Internal carotid artery stenosis, bilateral    per last duplex 05-01-2014  bilateral ICA 40-59%  . Major depression, chronic    ECT treatments extensive and multiple started 07/ 2016  . Memory loss    "both short and long-term; needs frequent reminders to follow instrucitons" (05/16/2017)  . Migraines    "none in years" (05/16/2017)  . OSA (obstructive sleep apnea)    per study 06/ 2012 moderate OSA    . Osteoporosis   . Pulmonary nodule    monitored by pcp    Patient Active Problem List   Diagnosis Date Noted  . Unstable angina ([place])   . Hypotension 04/20/2017  . CAD (coronary artery disease) 01/05/2016  . Coronary artery calcification seen on CAT scan 06/25/2012  . Lung nodule 06/25/2012  . Hypothyroidism 03/21/2012  . Chronic cough 08/12/2011  . OSA (obstructive sleep apnea) 04/05/2011  . Essential hypertension 11/06/2010  . EDEMA 11/06/2010  . Chest pain 11/06/2010  . Hyperlipidemia 08/22/2007  . Bipolar disorder ([place]) 08/22/2007  . CAROTID ARTERY DISEASE 08/22/2007  . GERD 08/22/2007  . LOW BACK PAIN 08/22/2007  . OSTEOPOROSIS 08/22/2007  . INSOMNIA 08/22/2007  . SYSTOLIC MURMUR 69/62/9528    Past Surgical History:  Procedure Laterality Date  . APPENDECTOMY  1971  . AUGMENTATION MAMMAPLASTY Bilateral   . CARDIOVASCULAR STRESS TEST  01-30-2015  dr Aundra Dubin   Low risk nuclear study w/ no evidence ischemia or infarction/  normal LV funciton and wall motion , 76%  . COLONOSCOPY W/ BIOPSIES AND POLYPECTOMY  "multiple"  . COLONOSCOPY WITH ESOPHAGOGASTRODUODENOSCOPY (EGD)  last one 08-04-2016  .  CORONARY ANGIOPLASTY WITH STENT PLACEMENT  05/16/2017   "LAD"  . CORONARY STENT INTERVENTION N/A 05/16/2017   Procedure: CORONARY STENT INTERVENTION;  Surgeon: Burnell Blanks, MD;  Location: [facility];  Service: Cardiovascular;  Laterality: N/A;  . ESOPHAGOGASTRODUODENOSCOPY  02-26-04  . ESOPHAGOGASTRODUODENOSCOPY  "multiple"  . LEFT HEART CATH AND CORONARY ANGIOGRAPHY N/A 05/16/2017   Procedure: LEFT HEART CATH AND CORONARY ANGIOGRAPHY;  Surgeon: Larey Dresser, MD;  Location: [facility];  Service: Cardiovascular;  Laterality: N/A;  . OVARIAN CYST  SURGERY  1970s   Laparotomy   . PORT-A-CATH PLACEMENT  05/31/2016   "@ [facility]; for ECT series"  . PORT-A-CATH REMOVAL N/A 03/11/2017   Procedure: REMOVAL PORT-A-CATH;  Surgeon: Jackolyn Confer, MD;  Location: [facility];  Service: General;  Laterality: N/A;  . TRANSTHORACIC ECHOCARDIOGRAM  09/05/2013  dr Aundra Dubin   mild LVH, ef 78-29%, grade 1 diastolic dysfunction/  very mild AV stenosis with mild AR/  trivial MR and PT/ mild to moderate LAE/ mild TR/ mild pulmonary hypertension with PA peak pressure 88mmHg  . TUBAL LIGATION    . VAGINAL HYSTERECTOMY     "partial"    OB History    Gravida Para Term Preterm AB Living   2 2 2     2    SAB TAB Ectopic Multiple Live Births                   Home Medications    Prior to Admission medications   Medication Sig Start Date End Date Taking? Authorizing Provider  acetaminophen (TYLENOL) 500 MG tablet Take 1,000 mg by mouth every 6 (six) hours as needed for mild pain.    [provider]  amLODipine (NORVASC) 10 MG tablet Take 0.5 tablets (5 mg total) by mouth daily. 05/30/17   Larey Dresser, MD  aspirin 81 MG tablet Take 81 mg by mouth daily.      [provider]  Calcium Carbonate-Vitamin D (CALCIUM 600/VITAMIN D PO) Take 1 tablet by mouth 2 (two) times daily.     [provider]  cetirizine (ZYRTEC) 10 MG tablet Take 10 mg by mouth 3 times/day as needed-between meals & bedtime.     [provider]  Cholecalciferol (VITAMIN D3) 2000 UNITS TABS Take 4,000 Units by mouth daily.     [provider]  clopidogrel (PLAVIX) 75 MG tablet Take 1 tablet (75 mg total) by mouth daily with breakfast. 05/17/17   Arbutus Leas, NP  irbesartan (AVAPRO) 150 MG tablet Take 150 mg by mouth daily.     [provider]  irbesartan (AVAPRO) 150 MG tablet TAKE 1 TABLET BY MOUTH DAILY 06/08/17   Larey Dresser, MD  L-Methylfolate-B12-B6-B2 (CEREFOLIN) 03-04-49-5 MG TABS Take 1 tablet by mouth at bedtime.     [provider]  levothyroxine (SYNTHROID, LEVOTHROID) 75 MCG tablet TAKE 1 TABLET(75 MCG) BY MOUTH DAILY 03/01/17   [name], MD  lithium carbonate (LITHOBID) 300 MG CR tablet Take 150 mg by mouth at bedtime.     [provider]  LORazepam (ATIVAN) 0.5 MG tablet Take 0.5 mg by mouth 3 times/day as needed-between meals & bedtime for anxiety.  03/31/14   [provider]  memantine (NAMENDA) 10 MG tablet Take 10 mg by mouth 2 (two) times daily.    [provider]  Multiple Vitamins-Minerals (MULTIVITAMIN WITH MINERALS) tablet Take 1 tablet by mouth daily.      [provider]  nitroGLYCERIN (NITROSTAT) 0.4 MG SL tablet Place 1 tablet (0.4 mg total) under the tongue every 5 (five) minutes as needed for chest pain. 05/10/17 08/08/17  Larey Dresser, MD  nortriptyline (PAMELOR) 10 MG capsule Take 10 mg by mouth at bedtime.    [provider]  omega-3 acid ethyl esters (LOVAZA) 1 g capsule Take 1 capsule (1 g total) by mouth daily. 05/17/17   Arbutus Leas, NP  pantoprazole (PROTONIX) 40 MG tablet Take 1 tablet (40 mg total) by mouth 2 (two) times daily before a meal. 03/23/17   Nandigam, Venia Minks, MD  PARoxetine (PAXIL) 20 MG tablet Take 20 mg by mouth at bedtime.     [provider]  primidone (MYSOLINE) 50 MG tablet 2 tabs AM and 1 tab QHS 03/25/17   [name], MD  Pyridoxine HCl (VITAMIN B-6) 500 MG tablet Take 500 mg by mouth 2 (two) times daily.    [provider]  ranitidine (ZANTAC) 150 MG capsule Take 150 mg by mouth at bedtime.    [provider]  ranitidine (ZANTAC) 150 MG tablet Take 1 tablet (150 mg total) by mouth 2 (two) times daily. 06/01/17   Mauri Pole, MD  rosuvastatin (CRESTOR) 40 MG tablet Take 1 tablet (40 mg total) by mouth daily. 05/30/17   Larey Dresser, MD  spironolactone (ALDACTONE) 25 MG tablet Take 0.5 tablets (12.5 mg total) by mouth daily. Patient not taking: Reported on  06/01/2017 12/02/16   Larey Dresser, MD  TOVIAZ 8 MG TB24 tablet TAKE 1 TABLET BY MOUTH DAILY 08/28/16   [name], MD  vitamin B-12 (CYANOCOBALAMIN) 1000 MCG tablet Take 1,000 mcg by mouth at bedtime.    [provider]    Family History Family History  Problem Relation Age of Onset  . Heart attack Father 94       deceased  . Hypertension Father   . Heart disease Father   . Breast cancer Paternal Grandmother        Age unknown  . Breast cancer Paternal Aunt        Age 32's  . Colon cancer Neg Hx     Social History Social History  Substance Use Topics  . Smoking status: Former Smoker    Packs/day: 2.00    Years: 15.00    Types: Cigarettes    Quit date: 01/13/1971  . Smokeless tobacco: Never Used  . Alcohol use 0.0 oz/week     Comment: 05/16/2017 "nothing in years"     Allergies   Propranolol and Brexpiprazole   Review of Systems Review of Systems  Unable to perform ROS: Other     Physical Exam Updated Vital Signs BP 135/68 (BP Location: Right Arm)   Pulse 64   Temp (!) 97.5 F (36.4 C) (Oral)   Resp 16   LMP  (LMP Unknown)   SpO2 100%   Physical Exam  Constitutional: She is oriented to person, place, and time. She appears well-developed and well-nourished. No distress.  HENT:  Head: Normocephalic and atraumatic.  Eyes: EOM are normal.  Neck: Normal range of motion.  Cardiovascular: Normal rate, regular rhythm and normal heart sounds.   Pulmonary/Chest: Effort normal and breath sounds normal.  Abdominal: Soft. She exhibits no distension. There is no tenderness.  Musculoskeletal: Normal range of motion.  Neurological: She is alert and oriented to person, place, and time.  Skin: Skin is warm and dry.  Psychiatric: She has  a normal mood and affect. Judgment normal.  Nursing note and vitals reviewed.    ED Treatments / Results  Labs (all labs ordered are listed, but only abnormal results are displayed) Labs Reviewed  COMPREHENSIVE  METABOLIC PANEL - Abnormal; Notable for the following:       Result Value   Glucose, Bld 101 (*)    BUN 24 (*)    Total Protein 6.2 (*)    ALT 59 (*)    Total Bilirubin 0.2 (*)    All other components within normal limits  ACETAMINOPHEN LEVEL - Abnormal; Notable for the following:    Acetaminophen (Tylenol), Serum <10 (*)    All other components within normal limits  CBC - Abnormal; Notable for the following:    RBC 3.41 (*)    Hemoglobin 10.8 (*)    HCT 32.4 (*)    All other components within normal limits  RAPID URINE DRUG SCREEN, HOSP PERFORMED - Abnormal; Notable for the following:    Benzodiazepines POSITIVE (*)    Barbiturates POSITIVE (*)    All other components within normal limits  LITHIUM LEVEL - Abnormal; Notable for the following:    Lithium Lvl 0.59 (*)    All other components within normal limits  ETHANOL  SALICYLATE LEVEL  CBG MONITORING, ED    EKG  EKG Interpretation  Date/Time:  Wednesday July 13 2017 13:41:27 EDT Ventricular Rate:  64 PR Interval:    QRS Duration: 102 QT Interval:  424 QTC Calculation: 438 R Axis:   -46 Text Interpretation:  Sinus rhythm LAD, consider left anterior fascicular block Anteroseptal infarct, age indeterminate No significant change was found Confirmed by Jola Schmidt [phone]) on 07/13/2017 2:06:07 PM       Radiology No results found.  Procedures Procedures (including critical care time)  Medications Ordered in ED Medications - No data to display   Initial Impression / Assessment and Plan / ED Course  I have reviewed the triage vital signs and the nursing notes.  Pertinent labs & imaging results that were available during my care of the patient were reviewed by me and considered in my medical decision making (see chart for details).   I spoke to poison control.  They would like a repeat set of electrolytes and a repeat lithium level at 6 PM.  The family would prefer to stay in emergency department for her lab  draws understanding that she could be in the ER for some time.     Care to Dr Zenia Resides  Final Clinical Impressions(s) / ED Diagnoses   Final diagnoses:  None    New Prescriptions New Prescriptions   No medications on file     Jola Schmidt, MD 07/13/17 [phone]

## 2017-07-13 NOTE — ED Notes (Signed)
[facility] called back stating that needs repeat EKG with QRS <100.

## 2017-07-14 ENCOUNTER — Telehealth: Payer: Self-pay | Admitting: Gastroenterology

## 2017-07-14 ENCOUNTER — Other Ambulatory Visit: Payer: Self-pay

## 2017-07-14 DIAGNOSIS — R1310 Dysphagia, unspecified: Secondary | ICD-10-CM

## 2017-07-14 NOTE — Telephone Encounter (Signed)
Dr Inocente Salles called. Meghan Oliver is complaining of retrosternal pain when she eats with severe burning sensation, some what improves when she drinks water. She dry heaves and gags with meats.  ?esophageal stricture /spasm She is scheduled for gastric emptying scan on Monday 07/18/17, will request  Barium esophagogram with 73mm tablet.   Damaris Hippo , MD [phone] Mon-Fri 8a-5p [phone] after 5p, weekends, holidays

## 2017-07-15 DIAGNOSIS — I1310 Hypertensive heart and chronic kidney disease without heart failure, with stage 1 through stage 4 chronic kidney disease, or unspecified chronic kidney disease: Secondary | ICD-10-CM | POA: Diagnosis not present

## 2017-07-15 DIAGNOSIS — Z79899 Other long term (current) drug therapy: Secondary | ICD-10-CM | POA: Diagnosis not present

## 2017-07-15 DIAGNOSIS — N182 Chronic kidney disease, stage 2 (mild): Secondary | ICD-10-CM | POA: Diagnosis not present

## 2017-07-15 DIAGNOSIS — Z87891 Personal history of nicotine dependence: Secondary | ICD-10-CM | POA: Diagnosis not present

## 2017-07-15 DIAGNOSIS — I10 Essential (primary) hypertension: Secondary | ICD-10-CM | POA: Diagnosis not present

## 2017-07-15 DIAGNOSIS — R9431 Abnormal electrocardiogram [ECG] [EKG]: Secondary | ICD-10-CM | POA: Diagnosis not present

## 2017-07-15 DIAGNOSIS — R51 Headache: Secondary | ICD-10-CM | POA: Diagnosis not present

## 2017-07-15 DIAGNOSIS — R001 Bradycardia, unspecified: Secondary | ICD-10-CM | POA: Diagnosis not present

## 2017-07-15 DIAGNOSIS — I442 Atrioventricular block, complete: Secondary | ICD-10-CM | POA: Diagnosis not present

## 2017-07-15 DIAGNOSIS — F314 Bipolar disorder, current episode depressed, severe, without psychotic features: Secondary | ICD-10-CM | POA: Diagnosis not present

## 2017-07-15 DIAGNOSIS — I2510 Atherosclerotic heart disease of native coronary artery without angina pectoris: Secondary | ICD-10-CM | POA: Diagnosis not present

## 2017-07-15 DIAGNOSIS — M1990 Unspecified osteoarthritis, unspecified site: Secondary | ICD-10-CM | POA: Diagnosis not present

## 2017-07-15 DIAGNOSIS — F319 Bipolar disorder, unspecified: Secondary | ICD-10-CM | POA: Diagnosis not present

## 2017-07-16 ENCOUNTER — Observation Stay (HOSPITAL_COMMUNITY)
Admission: AD | Admit: 2017-07-16 | Discharge: 2017-07-16 | Disposition: A | Discharge status: Home / Self Care | Payer: Medicare Other | Source: Other Acute Inpatient Hospital | Attending: Cardiology | Admitting: Cardiology

## 2017-07-16 DIAGNOSIS — F419 Anxiety disorder, unspecified: Secondary | ICD-10-CM | POA: Diagnosis not present

## 2017-07-16 DIAGNOSIS — E039 Hypothyroidism, unspecified: Secondary | ICD-10-CM | POA: Insufficient documentation

## 2017-07-16 DIAGNOSIS — Z7982 Long term (current) use of aspirin: Secondary | ICD-10-CM | POA: Diagnosis not present

## 2017-07-16 DIAGNOSIS — F319 Bipolar disorder, unspecified: Secondary | ICD-10-CM | POA: Diagnosis present

## 2017-07-16 DIAGNOSIS — Z888 Allergy status to other drugs, medicaments and biological substances status: Secondary | ICD-10-CM | POA: Diagnosis not present

## 2017-07-16 DIAGNOSIS — K219 Gastro-esophageal reflux disease without esophagitis: Secondary | ICD-10-CM | POA: Insufficient documentation

## 2017-07-16 DIAGNOSIS — R001 Bradycardia, unspecified: Principal | ICD-10-CM | POA: Diagnosis present

## 2017-07-16 DIAGNOSIS — N182 Chronic kidney disease, stage 2 (mild): Secondary | ICD-10-CM | POA: Insufficient documentation

## 2017-07-16 DIAGNOSIS — I10 Essential (primary) hypertension: Secondary | ICD-10-CM | POA: Diagnosis present

## 2017-07-16 DIAGNOSIS — I25810 Atherosclerosis of coronary artery bypass graft(s) without angina pectoris: Secondary | ICD-10-CM | POA: Diagnosis present

## 2017-07-16 DIAGNOSIS — I2510 Atherosclerotic heart disease of native coronary artery without angina pectoris: Secondary | ICD-10-CM | POA: Diagnosis not present

## 2017-07-16 DIAGNOSIS — E785 Hyperlipidemia, unspecified: Secondary | ICD-10-CM | POA: Diagnosis not present

## 2017-07-16 DIAGNOSIS — I129 Hypertensive chronic kidney disease with stage 1 through stage 4 chronic kidney disease, or unspecified chronic kidney disease: Secondary | ICD-10-CM | POA: Insufficient documentation

## 2017-07-16 DIAGNOSIS — Z791 Long term (current) use of non-steroidal anti-inflammatories (NSAID): Secondary | ICD-10-CM | POA: Diagnosis not present

## 2017-07-16 DIAGNOSIS — F329 Major depressive disorder, single episode, unspecified: Secondary | ICD-10-CM | POA: Diagnosis not present

## 2017-07-16 DIAGNOSIS — Z79899 Other long term (current) drug therapy: Secondary | ICD-10-CM | POA: Insufficient documentation

## 2017-07-16 DIAGNOSIS — I25709 Atherosclerosis of coronary artery bypass graft(s), unspecified, with unspecified angina pectoris: Secondary | ICD-10-CM | POA: Diagnosis not present

## 2017-07-16 DIAGNOSIS — Z955 Presence of coronary angioplasty implant and graft: Secondary | ICD-10-CM | POA: Diagnosis not present

## 2017-07-16 DIAGNOSIS — G8929 Other chronic pain: Secondary | ICD-10-CM | POA: Diagnosis not present

## 2017-07-16 DIAGNOSIS — M810 Age-related osteoporosis without current pathological fracture: Secondary | ICD-10-CM | POA: Diagnosis not present

## 2017-07-16 DIAGNOSIS — G4733 Obstructive sleep apnea (adult) (pediatric): Secondary | ICD-10-CM | POA: Diagnosis not present

## 2017-07-16 DIAGNOSIS — Z7902 Long term (current) use of antithrombotics/antiplatelets: Secondary | ICD-10-CM | POA: Insufficient documentation

## 2017-07-16 DIAGNOSIS — Z87891 Personal history of nicotine dependence: Secondary | ICD-10-CM | POA: Insufficient documentation

## 2017-07-16 LAB — CBC
HCT: 30.7 % — ABNORMAL LOW (ref 36.0–46.0)
Hemoglobin: 10 g/dL — ABNORMAL LOW (ref 12.0–15.0)
MCH: 30.7 pg (ref 26.0–34.0)
MCHC: 32.6 g/dL (ref 30.0–36.0)
MCV: 94.2 fL (ref 78.0–100.0)
PLATELETS: 201 10*3/uL (ref 150–400)
RBC: 3.26 MIL/uL — AB (ref 3.87–5.11)
RDW: 12.4 % (ref 11.5–15.5)
WBC: 6.9 10*3/uL (ref 4.0–10.5)

## 2017-07-16 LAB — CREATININE, SERUM: CREATININE: 0.87 mg/dL (ref 0.44–1.00)

## 2017-07-16 MED ORDER — VITAMIN D 1000 UNITS PO TABS
5000.0000 [IU] | ORAL_TABLET | Freq: Every day | ORAL | Status: DC
  Administered 2017-07-16: 5000 [IU] via ORAL
  Filled 2017-07-16: qty 5

## 2017-07-16 MED ORDER — ONDANSETRON HCL 4 MG/2ML IJ SOLN: 4.0000 mg | Freq: Four times a day (QID) | INTRAMUSCULAR | Status: DC | PRN

## 2017-07-16 MED ORDER — MEMANTINE HCL 10 MG PO TABS
10.0000 mg | ORAL_TABLET | Freq: Two times a day (BID) | ORAL | Status: DC
  Administered 2017-07-16: 10 mg via ORAL
  Filled 2017-07-16: qty 1

## 2017-07-16 MED ORDER — PAROXETINE HCL 20 MG PO TABS: 20.0000 mg | ORAL_TABLET | Freq: Every day | ORAL | Status: DC

## 2017-07-16 MED ORDER — SENNOSIDES-DOCUSATE SODIUM 8.6-50 MG PO TABS: 1.0000 | ORAL_TABLET | Freq: Every day | ORAL | Status: DC

## 2017-07-16 MED ORDER — HEPARIN SOD (PORK) LOCK FLUSH 100 UNIT/ML IV SOLN
500.0000 [IU] | INTRAVENOUS | Status: AC | PRN
  Administered 2017-07-16: 500 [IU]

## 2017-07-16 MED ORDER — LITHIUM CARBONATE ER 300 MG PO TBCR: 300.0000 mg | EXTENDED_RELEASE_TABLET | Freq: Every day | ORAL | Status: DC

## 2017-07-16 MED ORDER — ADULT MULTIVITAMIN W/MINERALS CH
1.0000 | ORAL_TABLET | Freq: Every day | ORAL | Status: DC
  Administered 2017-07-16: 1 via ORAL
  Filled 2017-07-16: qty 1

## 2017-07-16 MED ORDER — MIRTAZAPINE 7.5 MG PO TABS: 3.7500 mg | ORAL_TABLET | Freq: Every day | ORAL | Status: DC

## 2017-07-16 MED ORDER — NITROGLYCERIN 0.4 MG SL SUBL: 0.4000 mg | SUBLINGUAL_TABLET | SUBLINGUAL | Status: DC | PRN

## 2017-07-16 MED ORDER — LORAZEPAM 1 MG PO TABS
1.0000 mg | ORAL_TABLET | Freq: Two times a day (BID) | ORAL | Status: DC
  Administered 2017-07-16: 1 mg via ORAL
  Filled 2017-07-16: qty 1

## 2017-07-16 MED ORDER — PRIMIDONE 50 MG PO TABS: 50.0000 mg | ORAL_TABLET | Freq: Every day | ORAL | Status: DC

## 2017-07-16 MED ORDER — LINACLOTIDE 145 MCG PO CAPS: 145.0000 ug | ORAL_CAPSULE | Freq: Every day | ORAL | Status: DC

## 2017-07-16 MED ORDER — LORATADINE 10 MG PO TABS
10.0000 mg | ORAL_TABLET | Freq: Every day | ORAL | Status: DC
  Administered 2017-07-16: 10 mg via ORAL
  Filled 2017-07-16: qty 1

## 2017-07-16 MED ORDER — ACETAMINOPHEN 500 MG PO TABS: 1000.0000 mg | ORAL_TABLET | Freq: Four times a day (QID) | ORAL | Status: DC | PRN

## 2017-07-16 MED ORDER — ENOXAPARIN SODIUM 40 MG/0.4ML ~~LOC~~ SOLN: 40.0000 mg | SUBCUTANEOUS | Status: DC

## 2017-07-16 MED ORDER — SODIUM CHLORIDE 0.9% FLUSH: 3.0000 mL | Freq: Two times a day (BID) | INTRAVENOUS | Status: DC

## 2017-07-16 MED ORDER — ONDANSETRON HCL 4 MG PO TABS: 4.0000 mg | ORAL_TABLET | Freq: Four times a day (QID) | ORAL | Status: DC | PRN

## 2017-07-16 MED ORDER — CLOPIDOGREL BISULFATE 75 MG PO TABS
75.0000 mg | ORAL_TABLET | Freq: Every day | ORAL | Status: DC
  Administered 2017-07-16: 75 mg via ORAL
  Filled 2017-07-16: qty 1

## 2017-07-16 MED ORDER — NORTRIPTYLINE HCL 25 MG PO CAPS: 75.0000 mg | ORAL_CAPSULE | Freq: Every day | ORAL | Status: DC

## 2017-07-16 MED ORDER — SODIUM CHLORIDE 0.9% FLUSH: 10.0000 mL | INTRAVENOUS | Status: DC | PRN

## 2017-07-16 MED ORDER — ACETAMINOPHEN 650 MG RE SUPP: 650.0000 mg | Freq: Four times a day (QID) | RECTAL | Status: DC | PRN

## 2017-07-16 MED ORDER — CEREFOLIN 6-1-50-5 MG PO TABS: 1.0000 | ORAL_TABLET | Freq: Two times a day (BID) | ORAL | Status: DC

## 2017-07-16 MED ORDER — ACETAMINOPHEN 325 MG PO TABS: 650.0000 mg | ORAL_TABLET | Freq: Four times a day (QID) | ORAL | Status: DC | PRN

## 2017-07-16 MED ORDER — LORAZEPAM 0.5 MG PO TABS
0.5000 mg | ORAL_TABLET | Freq: Four times a day (QID) | ORAL | Status: DC | PRN
Start: 2017-07-16 — End: 2017-07-16

## 2017-07-16 MED ORDER — OMEGA-3-ACID ETHYL ESTERS 1 G PO CAPS
1.0000 g | ORAL_CAPSULE | Freq: Every day | ORAL | Status: DC
  Administered 2017-07-16: 1 g via ORAL
  Filled 2017-07-16: qty 1

## 2017-07-16 MED ORDER — ASPIRIN EC 81 MG PO TBEC: 81.0000 mg | DELAYED_RELEASE_TABLET | Freq: Every day | ORAL | Status: DC

## 2017-07-16 MED ORDER — FAMOTIDINE 20 MG PO TABS
20.0000 mg | ORAL_TABLET | Freq: Every day | ORAL | Status: DC
  Administered 2017-07-16: 20 mg via ORAL
  Filled 2017-07-16: qty 1

## 2017-07-16 MED ORDER — AMLODIPINE BESYLATE 5 MG PO TABS
5.0000 mg | ORAL_TABLET | Freq: Every day | ORAL | Status: DC
  Administered 2017-07-16: 5 mg via ORAL
  Filled 2017-07-16: qty 1

## 2017-07-16 MED ORDER — PANTOPRAZOLE SODIUM 40 MG PO TBEC
40.0000 mg | DELAYED_RELEASE_TABLET | Freq: Two times a day (BID) | ORAL | Status: DC
  Administered 2017-07-16: 40 mg via ORAL
  Filled 2017-07-16: qty 1

## 2017-07-16 MED ORDER — LEVOTHYROXINE SODIUM 75 MCG PO TABS
75.0000 ug | ORAL_TABLET | Freq: Every day | ORAL | Status: DC
  Administered 2017-07-16: 75 ug via ORAL
  Filled 2017-07-16: qty 1

## 2017-07-16 MED ORDER — LAVENDER OIL 80 MG PO CAPS: 160.0000 mg | ORAL_CAPSULE | Freq: Every day | ORAL | Status: DC

## 2017-07-16 MED ORDER — VITAMIN B-6 100 MG PO TABS
500.0000 mg | ORAL_TABLET | Freq: Two times a day (BID) | ORAL | Status: DC
  Administered 2017-07-16: 500 mg via ORAL
  Filled 2017-07-16: qty 5

## 2017-07-16 MED ORDER — ROSUVASTATIN CALCIUM 40 MG PO TABS
40.0000 mg | ORAL_TABLET | Freq: Every day | ORAL | Status: DC
  Administered 2017-07-16: 40 mg via ORAL
  Filled 2017-07-16: qty 1

## 2017-07-16 MED ORDER — VITAMIN B-12 1000 MCG PO TABS: 1000.0000 ug | ORAL_TABLET | Freq: Every day | ORAL | Status: DC

## 2017-07-16 MED ORDER — PRIMIDONE 50 MG PO TABS
100.0000 mg | ORAL_TABLET | Freq: Every morning | ORAL | Status: DC
  Administered 2017-07-16: 100 mg via ORAL
  Filled 2017-07-16: qty 2

## 2017-07-16 MED ORDER — FESOTERODINE FUMARATE ER 8 MG PO TB24
8.0000 mg | ORAL_TABLET | Freq: Every day | ORAL | Status: DC
  Administered 2017-07-16: 8 mg via ORAL
  Filled 2017-07-16: qty 1

## 2017-07-16 MED ORDER — INFLUENZA VAC SPLIT HIGH-DOSE 0.5 ML IM SUSY: 0.5000 mL | PREFILLED_SYRINGE | INTRAMUSCULAR | Status: DC

## 2017-07-16 NOTE — Care Management Note (Signed)
Case Management Note  Patient Details  Name: Meghan Oliver MRN: [id number] Date of Birth: Sep 02, 1941  Subjective/Objective: Pt presented for Bradycardia- IVF administered. PTA from home with husband. Plan to return home 07-16-17.                   Action/Plan: Husband had questions in regards to 24 hour care at home. CM did make them aware that they can look on Medicare.gov for assistance with personal care agencies. Pt and Husband aware that cost ranges from $14.00-$18.00.  No further needs from CM at this time.   Expected Discharge Date:  07/16/17               Expected Discharge Plan:  Home/Self Care  In-House Referral:  NA  Discharge planning Services  CM Consult  Post Acute Care Choice:  NA Choice offered to:  NA  DME Arranged:  N/A DME Agency:  NA  HH Arranged:  NA HH Agency:  NA  Status of Service:  Completed, signed off  If discussed at [place] of Stay Meetings, dates discussed:    Additional Comments:  Bethena Roys, RN 07/16/2017, 11:04 AM

## 2017-07-16 NOTE — H&P (Signed)
Patient ID: Meghan Oliver MRN: [id number], DOB/AGE: 1941/08/26  Admit date: 07/16/2017 Primary Physician: [name], MD Primary Cardiologist: Aundra Dubin  CARDIOLOGY ADMISSION History & Physical   PATIENT PROFILE   76 yo with history of bipolar disorder, carotid stenosis, HTN and hyperlipidemia. Discharged 05/17/2017 S/p LHC and coronary angio 05/16/17 with focal severe proximal LAD stenosis near origin of D1 and nonobstructive disease in LCx and RCA systems. Underwent successful PTCA/DES x 1 proximal LAD. Echo 05/11/2017 showed LV with mild LVH, EF 65-70%, mild AS, mild MR, mildly dilated LA.   HISTORY OF PRESENT ILLNESS   Patient was receiving ECT this afternoon at [facility]. She was hypertensive on arrival. She received 20 of labetalol, 20 dexa followed by hydralazine 5 mg IV. After the procedure she was bradycardic into the 40s in sinus rhythm, followed by 30s in junctional rhythm (per report). She was hypotensive with BP 80/30. With fluid resuscitation she slowly improved over the next hour and vitals stabilized. She was taken to [facility] ED and transferred to [facility]. Here she has no acute complaints. Her HR is in the 60s. She feels well. No CP, lightheadedness, SOB, or heart failure symptoms. She has had a few episodes of becoming suddenly weak, but no frank syncope over the past few months. She occasionally uses a cane for ambulation. No bleeding on DAPT.  Review of Systems All other systems reviewed and are otherwise negative except as noted above.   MEDICAL, FAMILY, AND SOCIAL HISTORY   Past Medical History:  Diagnosis Date  . Anxiety   . Arthritis    "hips, spine" (05/16/2017)  . Bipolar II disorder ([place])   . Chronic bronchitis ([place])   . Chronic lower back pain   . Chronic right hip pain   . CKD (chronic kidney disease), stage II   . Esophagitis, erosive   . GAD (generalized anxiety disorder)   . GERD (gastroesophageal reflux disease)   . Headache    "weekly" (05/16/2017)  . Heart  murmur, systolic   . History of adenomatous polyp of colon    08-04-2016  tubular adenoma  . History of electroconvulsive therapy    at [place]--  started 04-15-2015 to 11-17-2016  total greater than 40 times  . History of hiatal hernia   . Hyperlipidemia   . Hypertension   . Hypothyroidism   . Internal carotid artery stenosis, bilateral    per last duplex 05-01-2014  bilateral ICA 40-59%  . Major depression, chronic    ECT treatments extensive and multiple started 07/ 2016  . Memory loss    "both short and long-term; needs frequent reminders to follow instrucitons" (05/16/2017)  . Migraines    "none in years" (05/16/2017)  . OSA (obstructive sleep apnea)    per study 06/ 2012 moderate OSA    . Osteoporosis   . Pulmonary nodule    monitored by pcp    Past Surgical History:  Procedure Laterality Date  . APPENDECTOMY  1971  . AUGMENTATION MAMMAPLASTY Bilateral   . CARDIOVASCULAR STRESS TEST  01-30-2015  dr Aundra Dubin   Low risk nuclear study w/ no evidence ischemia or infarction/  normal LV funciton and wall motion , 76%  . COLONOSCOPY W/ BIOPSIES AND POLYPECTOMY  "multiple"  . COLONOSCOPY WITH ESOPHAGOGASTRODUODENOSCOPY (EGD)  last one 08-04-2016  . CORONARY ANGIOPLASTY WITH STENT PLACEMENT  05/16/2017   "LAD"  . CORONARY STENT INTERVENTION N/A 05/16/2017   Procedure: CORONARY STENT INTERVENTION;  Surgeon: Burnell Blanks, MD;  Location:  [place] [facility];  Service: Cardiovascular;  Laterality: N/A;  . ESOPHAGOGASTRODUODENOSCOPY  02-26-04  . ESOPHAGOGASTRODUODENOSCOPY  "multiple"  . LEFT HEART CATH AND CORONARY ANGIOGRAPHY N/A 05/16/2017   Procedure: LEFT HEART CATH AND CORONARY ANGIOGRAPHY;  Surgeon: Larey Dresser, MD;  Location: [place] CV [facility];  Service: Cardiovascular;  Laterality: N/A;  . OVARIAN CYST SURGERY  1970s   Laparotomy   . PORT-A-CATH PLACEMENT  05/31/2016   "@ [facility]; for ECT series"  . PORT-A-CATH REMOVAL N/A 03/11/2017   Procedure: REMOVAL PORT-A-CATH;   Surgeon: Jackolyn Confer, MD;  Location: [facility];  Service: General;  Laterality: N/A;  . TRANSTHORACIC ECHOCARDIOGRAM  09/05/2013  dr Aundra Dubin   mild LVH, ef 67-89%, grade 1 diastolic dysfunction/  very mild AV stenosis with mild AR/  trivial MR and PT/ mild to moderate LAE/ mild TR/ mild pulmonary hypertension with PA peak pressure 9mmHg  . TUBAL LIGATION    . VAGINAL HYSTERECTOMY     "partial"    Allergies  Allergen Reactions  . Propranolol Other (See Comments)    Low blood pressure  . Brexpiprazole Other (See Comments)    Aphasia and catatonia   Prior to Admission medications   Medication Sig Start Date End Date Taking? Authorizing Provider  acetaminophen (TYLENOL) 500 MG tablet Take 1,000 mg by mouth every 6 (six) hours as needed for mild pain.   Yes [provider]  amLODipine (NORVASC) 10 MG tablet Take 0.5 tablets (5 mg total) by mouth daily. 05/30/17  Yes Larey Dresser, MD  aspirin 81 MG tablet Take 81 mg by mouth at bedtime.    Yes [provider]  Calcium Carbonate-Vitamin D (CALCIUM 600/VITAMIN D PO) Take 1 tablet by mouth 2 (two) times daily.    Yes [provider]  Cholecalciferol (VITAMIN D-3) 5000 units TABS Take 1 tablet by mouth daily with breakfast.   Yes [provider]  clopidogrel (PLAVIX) 75 MG tablet Take 1 tablet (75 mg total) by mouth daily with breakfast. 05/17/17  Yes Jettie Booze E, NP  L-Methylfolate-B12-B6-B2 (CEREFOLIN) 03-04-49-5 MG TABS Take 1-2 tablets by mouth 2 (two) times daily. Takes 2 tablets in the morning and 1 tablet at bedtime   Yes [provider]  Lavender Oil 80 MG CAPS Take 160 mg by mouth at bedtime.   Yes [provider]  levothyroxine (SYNTHROID, LEVOTHROID) 75 MCG tablet TAKE 1 TABLET(75 MCG) BY MOUTH DAILY Patient taking differently: TAKE 1 TABLET(75 MCG) BY MOUTH every night at bedtime 03/01/17  Yes Baxley, Cresenciano Lick, MD  linaclotide [facility]) 145 MCG CAPS capsule Take  145 mcg by mouth at bedtime.   Yes [provider]  lithium carbonate (LITHOBID) 300 MG CR tablet Take 300 mg by mouth at bedtime.    Yes [provider]  LORazepam (ATIVAN) 0.5 MG tablet Take 0.5 mg by mouth every 6 (six) hours as needed for anxiety.  03/31/14  Yes [provider]  LORazepam (ATIVAN) 1 MG tablet Take 1 mg by mouth 2 (two) times daily.   Yes [provider]  memantine (NAMENDA) 10 MG tablet Take 10 mg by mouth 2 (two) times daily.   Yes [provider]  mirtazapine (REMERON) 7.5 MG tablet Take 3.25 mg by mouth at bedtime.   Yes [provider]  Multiple Vitamins-Minerals (MULTIVITAMIN WITH MINERALS) tablet Take 1 tablet by mouth daily.     Yes [provider]  nitroGLYCERIN (NITROSTAT) 0.4 MG SL tablet Place 1  tablet (0.4 mg total) under the tongue every 5 (five) minutes as needed for chest pain. 05/10/17 08/08/17 Yes Larey Dresser, MD  nortriptyline (PAMELOR) 25 MG capsule Take 75 mg by mouth at bedtime.   Yes [provider]  omega-3 acid ethyl esters (LOVAZA) 1 g capsule Take 1 capsule (1 g total) by mouth daily. 05/17/17  Yes Arbutus Leas, NP  Omega-3 Fatty Acids (OMEGA 3 PO) Take 1 capsule by mouth at bedtime.   Yes [provider]  OVER THE COUNTER MEDICATION Take 1 tablet by mouth 2 (two) times daily. *Cocavia*   Yes [provider]  PARoxetine (PAXIL) 20 MG tablet Take 20 mg by mouth at bedtime.    Yes [provider]  primidone (MYSOLINE) 50 MG tablet 2 tabs AM and 1 tab QHS 03/25/17  Yes Baxley, Cresenciano Lick, MD  Pyridoxine HCl (VITAMIN B-6) 500 MG tablet Take 500 mg by mouth 2 (two) times daily.   Yes [provider]  ranitidine (ZANTAC) 150 MG tablet Take 1 tablet (150 mg total) by mouth 2 (two) times daily. 06/01/17  Yes Nandigam, Venia Minks, MD  rosuvastatin (CRESTOR) 40 MG tablet Take 1 tablet (40 mg total) by mouth daily. 05/30/17  Yes Larey Dresser, MD    sennosides-docusate sodium (SENOKOT-S) 8.6-50 MG tablet Take 1 tablet by mouth at bedtime.   Yes [provider]  TOVIAZ 8 MG TB24 tablet TAKE 1 TABLET BY MOUTH DAILY 08/28/16  Yes Baxley, Cresenciano Lick, MD  vitamin B-12 (CYANOCOBALAMIN) 1000 MCG tablet Take 1,000 mcg by mouth at bedtime.   Yes [provider]  cetirizine (ZYRTEC) 10 MG tablet Take 10 mg by mouth daily as needed for allergies.     [provider]  irbesartan (AVAPRO) 150 MG tablet TAKE 1 TABLET BY MOUTH DAILY Patient taking differently: TAKE 1 TABLET BY MOUTH DAILY as needed for ECT treatment days 06/08/17   Larey Dresser, MD  pantoprazole (PROTONIX) 40 MG tablet Take 1 tablet (40 mg total) by mouth 2 (two) times daily before a meal. Patient not taking: Reported on 07/13/2017 03/23/17   Mauri Pole, MD  spironolactone (ALDACTONE) 25 MG tablet Take 0.5 tablets (12.5 mg total) by mouth daily. 12/02/16   Larey Dresser, MD   Family History  Problem Relation Age of Onset  . Heart attack Father 110       deceased  . Hypertension Father   . Heart disease Father   . Breast cancer Paternal Grandmother        Age unknown  . Breast cancer Paternal Aunt        Age 11's  . Colon cancer Neg Hx    Social History   Social History  . Marital status: Married    Spouse name: Dr. Lyla Son  . Number of children: 2  . Years of education: N/A   Occupational History  . housewife Unemployed   Social History Main Topics  . Smoking status: Former Smoker    Packs/day: 2.00    Years: 15.00    Types: Cigarettes    Quit date: 01/13/1971  . Smokeless tobacco: Never Used  . Alcohol use 0.0 oz/week     Comment: 05/16/2017 "nothing in years"  . Drug use: No  . Sexual activity: Not Currently     Comment: intercourse age 75, sexual partners less than 5   Other Topics Concern  . Not on file   Social History Narrative  . No  narrative on file      PHYSICAL EXAM  Blood pressure (!) 155/69, pulse (!) 56,  temperature 98.3 F (36.8 C), temperature source Oral, resp. rate 18, weight 49.9 kg (110 lb 0.2 oz), SpO2 100 %.  General: Pleasant, NAD Psych: Normal affect. Neuro: Mild confusion answering questions. Moves all extremities spontaneously. HEENT: Sclera anicteric, noninjected. MMM.  Lungs:  Clear, normal WOB Heart: RRR no m/r/g Abdomen: Soft, non-tender, non-distended, BS + x 4.  Extremities: No clubbing or cyanosis. Edema: none. DP/PT/Radials 2+ and equal bilaterally.   LABS and STUDIES  Troponin (Point of Care Test) No results for input(s): TROPIPOC in the last 72 hours. No results for input(s): CKTOTAL, CKMB, TROPONINI in the last 72 hours. Lab Results  Component Value Date   WBC 5.9 07/13/2017   HGB 10.8 (L) 07/13/2017   HCT 32.4 (L) 07/13/2017   MCV 95.0 07/13/2017   PLT 215 07/13/2017    Recent Labs Lab 07/13/17 1431 07/13/17 1823  NA 136 136  K 4.0 3.6  CL 104 108  CO2 22 22  BUN 24* 19  CREATININE 0.75 0.77  CALCIUM 9.3 9.1  PROT 6.2*  --   BILITOT 0.2*  --   ALKPHOS 40  --   ALT 59*  --   AST 32  --   GLUCOSE 101* 88   Lab Results  Component Value Date   CHOL 211 (H) 05/25/2017   CHOL 213 (H) 05/25/2017   HDL 119 05/25/2017   LDLCALC 81 05/25/2017   TRIG 63 05/25/2017   Lab Results  Component Value Date   DDIMER 0.26 03/20/2012     ECG was independently reviewed. Pending   ASSESSMENT and PLAN   76 yo with history of bipolar disorder, CAD, carotid stenosis, HTN and hyperlipidemia who experienced bradycardia and syncope around the time of ECT. Presumed to be related to sedation, she is now stable with normal vitals and no symptoms. Will observe overnight to ensure clinical stibiliaty - Monitor on tele - ECG pending  CAD s/p recent PCI to LAD - Aspirin + Clopidogel - Crestor  History of Hypertension Has had low Bps as outpatient requiring discontinuation of some of her medications - Amlodipine 5 mg daily - Holding Irbesartan and  Spiro  Depression Continue home medication regimen unchanged  IVF: None Diet: Regular DVT PPX: Lovenox Dispo: Admit to Obs, tele Code Status: Full  PAULEY, ERIC D, MD 07/16/2017, 6:04 AM

## 2017-07-16 NOTE — Discharge Summary (Signed)
Discharge Summary    Patient ID: Meghan Oliver,  MRN: [id number], DOB/AGE: 03/24/1941 76 y.o.  Admit date: 07/16/2017 Discharge date: 07/16/2017  Primary Care Provider: [name] Primary Cardiologist: Dr. Aundra Dubin  Discharge Diagnoses    Principal Problem:   Bradycardia Active Problems:   Hyperlipidemia   Bipolar disorder [facility])   Essential hypertension   History of Present Illness     Meghan Oliver is a 76 y.o. female with past medical history of CAD (s/p cath in 05/2017 with DES to LAD), carotid artery stenosis, bipolar disorder, HTN, and HLD who presented to [facility] on 07/16/2017 as a transfer from [facility].   She had been undergoing ECT at [facility] during the afternoon hours of 07/15/2017. She was hypertensive upon arrival and received 20mg  of Labetalol, 20mg  of Dexa, and 5mg  of IV Hydralazine. Following the procedure, she was bradycardiac into the 40's and then in the 30's with a junctional rhythm. She was given IVF for hypotension and taken to the [facility] ED but requested transfer to [facility].    Hospital Course     Consultants: None  Upon arrival, she was back in NSR and HR was appropriate in the 60's. She denied any associated chest pain, palpitations, dyspnea, orthopnea, PND, or lower extremity edema. Her junctional rhythm was thought to be secondary to sedation and the Labetalol she received.   She was monitored on telemetry for several hours and was maintaining NSR with HR in the 60's. BP was improved to 136/58 on most recent check. She was last examined by Dr. Rayann Heman and deemed stable for discharge with the recommendation to avoid the use of BB therapy (especially IV Labetalol) in the future. She will keep her previously scheduled follow-up appointment with Dr. Aundra Dubin in 2 weeks.   _____________  Discharge Vitals and Physical Examination Blood pressure (!) 136/58, pulse 61, temperature 98.3 F (36.8 C), temperature source Oral, resp. rate 18, weight 110 lb  0.2 oz (49.9 kg), SpO2 100 %.  Filed Weights   07/16/17 0341  Weight: 110 lb 0.2 oz (49.9 kg)   General: Pleasant Caucasian female appearing in NAD Psych: Normal affect. Neuro: Alert and oriented X 3. Moves all extremities spontaneously. HEENT: Normal  Neck: Supple without bruits or JVD. Lungs:  Resp regular and unlabored, CTA without wheezing or rales. Heart: RRR no s3, s4, or murmurs. Abdomen: Soft, non-tender, non-distended, BS + x 4.  Extremities: No clubbing, cyanosis or edema. DP/PT/Radials 2+ and equal bilaterally.   Labs & Radiologic Studies     CBC  Recent Labs  07/13/17 1431 07/16/17 0541  WBC 5.9 6.9  HGB 10.8* 10.0*  HCT 32.4* 30.7*  MCV 95.0 94.2  PLT 215 270   Basic Metabolic Panel  Recent Labs  07/13/17 1431 07/13/17 1823 07/16/17 0541  NA 136 136  --   K 4.0 3.6  --   CL 104 108  --   CO2 22 22  --   GLUCOSE 101* 88  --   BUN 24* 19  --   CREATININE 0.75 0.77 0.87  CALCIUM 9.3 9.1  --    Liver Function Tests  Recent Labs  07/13/17 1431  AST 32  ALT 59*  ALKPHOS 40  BILITOT 0.2*  PROT 6.2*  ALBUMIN 3.6   No results for input(s): LIPASE, AMYLASE in the last 72 hours. Cardiac Enzymes No results for input(s): CKTOTAL, CKMB, CKMBINDEX, TROPONINI in the last 72 hours. BNP Invalid input(s): POCBNP D-Dimer  No results for input(s): DDIMER in the last 72 hours. Hemoglobin A1C No results for input(s): HGBA1C in the last 72 hours. Fasting Lipid Panel No results for input(s): CHOL, HDL, LDLCALC, TRIG, CHOLHDL, LDLDIRECT in the last 72 hours. Thyroid Function Tests No results for input(s): TSH, T4TOTAL, T3FREE, THYROIDAB in the last 72 hours.  Invalid input(s): FREET3  No results found.   Diagnostic Studies/Procedures    None Performed.   Disposition   Pt is being discharged home today in good condition.  Follow-up Plans & Appointments    Follow-up Information    Larey Dresser, MD Follow up on 08/01/2017.   Specialty:   Cardiology Why:  Cardiology Follow-Up on 08/01/2017 at 9:00AM.  Contact information: [address] [place] [address] [phone]            Discharge Medications     Medication List    TAKE these medications   acetaminophen 500 MG tablet Commonly known as:  TYLENOL Take 1,000 mg by mouth every 6 (six) hours as needed for mild pain.   amLODipine 10 MG tablet Commonly known as:  NORVASC Take 0.5 tablets (5 mg total) by mouth daily.   aspirin 81 MG tablet Take 81 mg by mouth at bedtime.   CALCIUM 600/VITAMIN D PO Take 1 tablet by mouth 2 (two) times daily.   CEREFOLIN 03-04-49-5 MG Tabs Take 1-2 tablets by mouth 2 (two) times daily. Takes 2 tablets in the morning and 1 tablet at bedtime   cetirizine 10 MG tablet Commonly known as:  ZYRTEC Take 10 mg by mouth daily as needed for allergies.   clopidogrel 75 MG tablet Commonly known as:  PLAVIX Take 1 tablet (75 mg total) by mouth daily with breakfast.   irbesartan 150 MG tablet Commonly known as:  AVAPRO TAKE 1 TABLET BY MOUTH DAILY What changed:  See the new instructions.   Lavender Oil 80 MG Caps Take 160 mg by mouth at bedtime.   levothyroxine 75 MCG tablet Commonly known as:  SYNTHROID, LEVOTHROID TAKE 1 TABLET(75 MCG) BY MOUTH DAILY What changed:  See the new instructions.   LINZESS 145 MCG Caps capsule Generic drug:  linaclotide Take 145 mcg by mouth at bedtime.   lithium carbonate 300 MG CR tablet Commonly known as:  LITHOBID Take 300 mg by mouth at bedtime.   LORazepam 1 MG tablet Commonly known as:  ATIVAN Take 1 mg by mouth 2 (two) times daily.   LORazepam 0.5 MG tablet Commonly known as:  ATIVAN Take 0.5 mg by mouth every 6 (six) hours as needed for anxiety.   memantine 10 MG tablet Commonly known as:  NAMENDA Take 10 mg by mouth 2 (two) times daily.   mirtazapine 7.5 MG tablet Commonly known as:  REMERON Take 3.25 mg by mouth at bedtime.   multivitamin with  minerals tablet Take 1 tablet by mouth daily.   nitroGLYCERIN 0.4 MG SL tablet Commonly known as:  NITROSTAT Place 1 tablet (0.4 mg total) under the tongue every 5 (five) minutes as needed for chest pain.   nortriptyline 25 MG capsule Commonly known as:  PAMELOR Take 75 mg by mouth at bedtime.   OMEGA 3 PO Take 1 capsule by mouth at bedtime.   omega-3 acid ethyl esters 1 g capsule Commonly known as:  LOVAZA Take 1 capsule (1 g total) by mouth daily.   OVER THE COUNTER MEDICATION Take 1 tablet by mouth 2 (two) times daily. *Cocavia*  pantoprazole 40 MG tablet Commonly known as:  PROTONIX Take 1 tablet (40 mg total) by mouth 2 (two) times daily before a meal.   PARoxetine 20 MG tablet Commonly known as:  PAXIL Take 20 mg by mouth at bedtime.   primidone 50 MG tablet Commonly known as:  MYSOLINE 2 tabs AM and 1 tab QHS   ranitidine 150 MG tablet Commonly known as:  ZANTAC Take 1 tablet (150 mg total) by mouth 2 (two) times daily.   rosuvastatin 40 MG tablet Commonly known as:  CRESTOR Take 1 tablet (40 mg total) by mouth daily.   sennosides-docusate sodium 8.6-50 MG tablet Commonly known as:  SENOKOT-S Take 1 tablet by mouth at bedtime.   spironolactone 25 MG tablet Commonly known as:  ALDACTONE Take 0.5 tablets (12.5 mg total) by mouth daily.   TOVIAZ 8 MG Tb24 tablet Generic drug:  fesoterodine TAKE 1 TABLET BY MOUTH DAILY   vitamin B-12 1000 MCG tablet Commonly known as:  CYANOCOBALAMIN Take 1,000 mcg by mouth at bedtime.   vitamin B-6 500 MG tablet Take 500 mg by mouth 2 (two) times daily.   Vitamin D-3 5000 units Tabs Take 1 tablet by mouth daily with breakfast.          Allergies Allergies  Allergen Reactions  . Propranolol Other (See Comments)    Low blood pressure  . Brexpiprazole Other (See Comments)    Aphasia and catatonia     Outstanding Labs/Studies   None  Duration of Discharge Encounter   Greater than 30 minutes  including physician time.  Signed, Erma Heritage, PA-C 07/16/2017, 9:34 AM   I have seen, examined the patient, and reviewed the above assessment and plan.  Changes to above are made where necessary.  On exam, RRR.  Doing well without symptoms of bradycardia.  No indication for pacemaker or further monitoring. Avoid AV chronodepressant medicines in the future.  Co Sign: Thompson Grayer, MD 07/16/2017 8:43 PM

## 2017-07-16 NOTE — Consult Note (Signed)
ELECTROPHYSIOLOGY CONSULT NOTE    Primary Care Physician: [name], MD Referring Physician:  Dr Aundra Dubin  Admit Date: 07/16/2017  Reason for consultation:  Drs Aundra Dubin and [facility] request that I see the patient for sinus bradycardia  Meghan Oliver is a 76 y.o. female with a h/o HTN, CAD, and bipolar disorder.  She presented to [facility] for ECT therapy 07/15/17.  She developed post procedure bradycardia with sinus rates of 40s.  She was observed at [facility] and slowly improved. She was transfer to [facility] for further evaluation. Per patients spouse (Dr Velora Heckler), she received Labetalol 20mg  IV during her ECT therapy which is the likely cause for the episode.  She typically does not have issues with sinus bradyarrhythmias.  Her bradycardia has resolved. Currently, she is resting comfortably and is without complaint.  Today, she denies symptoms of palpitations, chest pain, shortness of breath, lower extremity edema, dizziness, presyncope, syncope, or neurologic sequela. The patient is tolerating medications without difficulties and is otherwise without complaint today.   Past Medical History:  Diagnosis Date  . Anxiety   . Arthritis    "hips, spine" (05/16/2017)  . Bipolar II disorder ([place])   . Chronic bronchitis ([place])   . Chronic lower back pain   . Chronic right hip pain   . CKD (chronic kidney disease), stage II   . Esophagitis, erosive   . GAD (generalized anxiety disorder)   . GERD (gastroesophageal reflux disease)   . Headache    "weekly" (05/16/2017)  . Heart murmur, systolic   . History of adenomatous polyp of colon    08-04-2016  tubular adenoma  . History of electroconvulsive therapy    at [place]--  started 04-15-2015 to 11-17-2016  total greater than 40 times  . History of hiatal hernia   . Hyperlipidemia   . Hypertension   . Hypothyroidism   . Internal carotid artery stenosis, bilateral    per last duplex 05-01-2014  bilateral ICA 40-59%  . Major depression, chronic    ECT treatments extensive and multiple started 07/ 2016  . Memory loss    "both short and long-term; needs frequent reminders to follow instrucitons" (05/16/2017)  . Migraines    "none in years" (05/16/2017)  . OSA (obstructive sleep apnea)    per study 06/ 2012 moderate OSA    . Osteoporosis   . Pulmonary nodule    monitored by pcp   Past Surgical History:  Procedure Laterality Date  . APPENDECTOMY  1971  . AUGMENTATION MAMMAPLASTY Bilateral   . CARDIOVASCULAR STRESS TEST  01-30-2015  dr Aundra Dubin   Low risk nuclear study w/ no evidence ischemia or infarction/  normal LV funciton and wall motion , 76%  . COLONOSCOPY W/ BIOPSIES AND POLYPECTOMY  "multiple"  . COLONOSCOPY WITH ESOPHAGOGASTRODUODENOSCOPY (EGD)  last one 08-04-2016  . CORONARY ANGIOPLASTY WITH STENT PLACEMENT  05/16/2017   "LAD"  . CORONARY STENT INTERVENTION N/A 05/16/2017   Procedure: CORONARY STENT INTERVENTION;  Surgeon: Burnell Blanks, MD;  Location: [facility];  Service: Cardiovascular;  Laterality: N/A;  . ESOPHAGOGASTRODUODENOSCOPY  02-26-04  . ESOPHAGOGASTRODUODENOSCOPY  "multiple"  . LEFT HEART CATH AND CORONARY ANGIOGRAPHY N/A 05/16/2017   Procedure: LEFT HEART CATH AND CORONARY ANGIOGRAPHY;  Surgeon: Larey Dresser, MD;  Location: [place];  Service: Cardiovascular;  Laterality: N/A;  . OVARIAN CYST SURGERY  1970s   Laparotomy   . PORT-A-CATH PLACEMENT  05/31/2016   "@ [facility]; for ECT series"  .  PORT-A-CATH REMOVAL N/A 03/11/2017   Procedure: REMOVAL PORT-A-CATH;  Surgeon: Jackolyn Confer, MD;  Location: [facility]  Service: General;  Laterality: N/A;  . TRANSTHORACIC ECHOCARDIOGRAM  09/05/2013  dr Aundra Dubin   mild LVH, ef 14-78%, grade 1 diastolic dysfunction/  very mild AV stenosis with mild AR/  trivial MR and PT/ mild to moderate LAE/ mild TR/ mild pulmonary hypertension with PA peak pressure 65mmHg  . TUBAL LIGATION    . VAGINAL HYSTERECTOMY     "partial"    .  amLODipine  5 mg Oral Daily  . aspirin EC  81 mg Oral QHS  . cholecalciferol  5,000 Units Oral Q breakfast  . clopidogrel  75 mg Oral Daily  . enoxaparin (LOVENOX) injection  40 mg Subcutaneous Q24H  . famotidine  20 mg Oral Daily  . fesoterodine  8 mg Oral Daily  . [START ON 07/17/2017] Influenza vac split quadrivalent PF  0.5 mL Intramuscular Tomorrow-1000  . levothyroxine  75 mcg Oral QAC breakfast  . linaclotide  145 mcg Oral QHS  . lithium carbonate  300 mg Oral QHS  . loratadine  10 mg Oral Daily  . LORazepam  1 mg Oral BID  . memantine  10 mg Oral BID  . mirtazapine  3.75 mg Oral QHS  . multivitamin with minerals  1 tablet Oral Daily  . nortriptyline  75 mg Oral QHS  . omega-3 acid ethyl esters  1 g Oral Daily  . pantoprazole  40 mg Oral BID AC  . PARoxetine  20 mg Oral QHS  . primidone  100 mg Oral q morning - 10a  . primidone  50 mg Oral QHS  . vitamin B-6  500 mg Oral BID  . rosuvastatin  40 mg Oral Daily  . senna-docusate  1 tablet Oral QHS  . sodium chloride flush  3 mL Intravenous Q12H  . vitamin B-12  1,000 mcg Oral QHS     Allergies  Allergen Reactions  . Propranolol Other (See Comments)    Low blood pressure  . Brexpiprazole Other (See Comments)    Aphasia and catatonia    Social History   Social History  . Marital status: Married    Spouse name: Dr. Lyla Son  . Number of children: 2  . Years of education: N/A   Occupational History  . housewife Unemployed   Social History Main Topics  . Smoking status: Former Smoker    Packs/day: 2.00    Years: 15.00    Types: Cigarettes    Quit date: 01/13/1971  . Smokeless tobacco: Never Used  . Alcohol use 0.0 oz/week     Comment: 05/16/2017 "nothing in years"  . Drug use: No  . Sexual activity: Not Currently     Comment: intercourse age 36, sexual partners less than 5   Other Topics Concern  . Not on file   Social History Narrative  . No narrative on file    Family History  Problem Relation  Age of Onset  . Heart attack Father 71       deceased  . Hypertension Father   . Heart disease Father   . Breast cancer Paternal Grandmother        Age unknown  . Breast cancer Paternal Aunt        Age 97's  . Colon cancer Neg Hx     ROS- All systems are reviewed and negative except as per the HPI above  Physical Exam: Telemetry: sinus rhythm  Vitals:   07/16/17 0341 07/16/17 0646 07/16/17 0648 07/16/17 0651  BP: (!) 155/69 (!) 155/56 (!) 142/68 (!) 136/58  Pulse: (!) 56 (!) 56 (!) 59 61  Resp: 18     Temp: 98.3 F (36.8 C)     TempSrc: Oral     SpO2: 100%     Weight: 110 lb 0.2 oz (49.9 kg)       GEN- The patient is well appearing, alert and oriented x 3 today.   Head- normocephalic, atraumatic Eyes-  Sclera clear, conjunctiva pink Ears- hearing intact Oropharynx- clear Neck- supple, no JVP Lymph- no cervical lymphadenopathy Lungs- Clear to ausculation bilaterally, normal work of breathing Heart- Regular rate and rhythm, no murmurs, rubs or gallops, PMI not laterally displaced GI- soft, NT, ND, + BS Extremities- no clubbing, cyanosis, or edema MS- no significant deformity or atrophy Skin- no rash or lesion Psych- euthymic mood, full affect Neuro- strength and sensation are intact  EKG reveals sinus rhythm at 60 bpm, LAD, otherwise normal ekg  Labs:   Lab Results  Component Value Date   WBC 6.9 07/16/2017   HGB 10.0 (L) 07/16/2017   HCT 30.7 (L) 07/16/2017   MCV 94.2 07/16/2017   PLT 201 07/16/2017    Recent Labs Lab 07/13/17 1431 07/13/17 1823 07/16/17 0541  NA 136 136  --   K 4.0 3.6  --   CL 104 108  --   CO2 22 22  --   BUN 24* 19  --   CREATININE 0.75 0.77 0.87  CALCIUM 9.3 9.1  --   PROT 6.2*  --   --   BILITOT 0.2*  --   --   ALKPHOS 40  --   --   ALT 59*  --   --   AST 32  --   --   GLUCOSE 101* 88  --    Lab Results  Component Value Date   CKTOTAL 47 03/21/2012   CKMB 2.5 03/21/2012   TROPONINI <0.30 03/21/2012    Lab Results    Component Value Date   CHOL 211 (H) 05/25/2017   CHOL 213 (H) 05/25/2017   CHOL 239 (H) 07/23/2016   Lab Results  Component Value Date   HDL 119 05/25/2017   HDL 165 07/23/2016   HDL 118 01/05/2016   Lab Results  Component Value Date   LDLCALC 81 05/25/2017   LDLCALC 59 07/23/2016   LDLCALC 65 01/05/2016   Lab Results  Component Value Date   TRIG 63 05/25/2017   TRIG 73 07/23/2016   TRIG 66 01/05/2016   Lab Results  Component Value Date   CHOLHDL 1.8 05/25/2017   CHOLHDL 1.4 07/23/2016   CHOLHDL 1.7 01/05/2016   Lab Results  Component Value Date   LDLDIRECT 98.2 04/10/2007   LDLDIRECT 87.5 01/30/2007     Echo and cath 05/2017 are reviewed  ASSESSMENT AND PLAN:   1. Sinus bradycardia Occurred in the setting of IV labetolol for ECT therapy.  Now resolved.  Denies other symptoms of bradycardia.  I have advised that we avoid labetolol/ chronodepressant medicines in the future. No indication for PPM at this time.  No indication for further cardiac monitoring.  2. CAD No ischemic symptoms No changes  Electrophysiology team to see as needed while here. Please call with questions.  Thompson Grayer, MD 07/16/2017  9:35 AM

## 2017-07-16 NOTE — Progress Notes (Signed)
Discharge instructions reviewed with patient and husband, Dr. Inocente Salles.  Questions answered, verbalized understanding.  Patient transported to main entrance to be taken home by husband.

## 2017-07-16 NOTE — Progress Notes (Signed)
Patient had pulled out prior access. Patient to be discharged. R PAC re accessed, flushed with normal saline, and Heparin 500 U instilled per protocol. Dressing applied and clean, dry, and intact.

## 2017-07-18 ENCOUNTER — Ambulatory Visit (HOSPITAL_BASED_OUTPATIENT_CLINIC_OR_DEPARTMENT_OTHER): Payer: Medicare Other

## 2017-07-18 ENCOUNTER — Ambulatory Visit (HOSPITAL_COMMUNITY)
Admission: RE | Admit: 2017-07-18 | Discharge: 2017-07-18 | Disposition: A | Discharge status: Home / Self Care | Payer: Medicare Other | Source: Ambulatory Visit | Attending: Gastroenterology | Admitting: Gastroenterology

## 2017-07-18 DIAGNOSIS — R1310 Dysphagia, unspecified: Secondary | ICD-10-CM | POA: Diagnosis not present

## 2017-07-18 DIAGNOSIS — K224 Dyskinesia of esophagus: Secondary | ICD-10-CM | POA: Insufficient documentation

## 2017-07-18 DIAGNOSIS — K449 Diaphragmatic hernia without obstruction or gangrene: Secondary | ICD-10-CM | POA: Insufficient documentation

## 2017-07-18 DIAGNOSIS — K219 Gastro-esophageal reflux disease without esophagitis: Secondary | ICD-10-CM | POA: Diagnosis not present

## 2017-07-18 DIAGNOSIS — R633 Feeding difficulties, unspecified: Secondary | ICD-10-CM

## 2017-07-18 MED ORDER — TECHNETIUM TC 99M SULFUR COLLOID
2.1500 | Freq: Once | INTRAVENOUS | Status: AC
  Administered 2017-07-18: 2.15 via INTRAVENOUS

## 2017-07-19 NOTE — Telephone Encounter (Signed)
PC to  Dr Inocente Salles, [name] had dental surgery, Recent labs done for Reclast normal  Per N [facility] schedule Reclast  , Scheduled on 07/28/17 at  1 pm . Results for Meghan Oliver, Meghan Oliver (MRN [id number]) as of 07/19/2017 15:07  Ref. Range 07/13/2017 18:23  BUN Latest Ref Range: 6 - 20 mg/dL 19  Creatinine Latest Ref Range: 0.44 - 1.00 mg/dL 0.77  Calcium Latest Ref Range: 8.9 - 10.3 mg/dL 9.1   Mailed instructions to patient and called pt healthcare provider Dr Inocente Salles and [place]

## 2017-07-20 ENCOUNTER — Other Ambulatory Visit: Payer: Self-pay

## 2017-07-20 DIAGNOSIS — I10 Essential (primary) hypertension: Secondary | ICD-10-CM | POA: Diagnosis not present

## 2017-07-20 DIAGNOSIS — F310 Bipolar disorder, current episode hypomanic: Secondary | ICD-10-CM | POA: Diagnosis not present

## 2017-07-20 DIAGNOSIS — F319 Bipolar disorder, unspecified: Secondary | ICD-10-CM | POA: Diagnosis not present

## 2017-07-20 DIAGNOSIS — I2510 Atherosclerotic heart disease of native coronary artery without angina pectoris: Secondary | ICD-10-CM | POA: Diagnosis not present

## 2017-07-20 NOTE — Progress Notes (Signed)
DES to LAD in 8/18.  Cannot stop Plavix yet.  Ideally 1 year, minimum 6 months.

## 2017-07-21 ENCOUNTER — Telehealth: Payer: Self-pay | Admitting: Gastroenterology

## 2017-07-21 ENCOUNTER — Ambulatory Visit (HOSPITAL_BASED_OUTPATIENT_CLINIC_OR_DEPARTMENT_OTHER): Payer: Medicare Other | Attending: Cardiology | Admitting: Cardiology

## 2017-07-21 VITALS — Ht 59.0 in | Wt 104.0 lb

## 2017-07-21 DIAGNOSIS — G4733 Obstructive sleep apnea (adult) (pediatric): Secondary | ICD-10-CM

## 2017-07-21 NOTE — Telephone Encounter (Signed)
Called [name] back and discussed her concerns. Sindia is having a decline in her mental status with poor memory and confusion , she had symptomatic bradycardia after last ECT at [facility].  She has follow up appointment with Dr Aundra Dubin next week  I called Dr Inocente Salles back, discussed the plan to hold off EGD until she is evaluated by Dr Aundra Dubin. Given recent DES stent she is at high risk for thrombosis and is usually not recommended to hold anti platelet therapy.  If I were to do EGD with empiric dilation of esophagus for intermittent dysphagia, will increase the risk for bleeding. Diagnostic EGD is less likely to be helpful. Based on history provided by Dr Inocente Salles, [name] may be having ? esophageal spasms often triggered during a meal. Advised to try 1 drop of peppermint oil diluted in 1 cup of warm water before starting a meal or SL nitro for severe episodes.

## 2017-07-21 NOTE — Telephone Encounter (Signed)
EGD cancelled per Dr Silverio Decamp.

## 2017-07-22 ENCOUNTER — Encounter: Payer: Medicare Other | Admitting: Gastroenterology

## 2017-07-22 NOTE — Procedures (Signed)
   Patient Name: Meghan Oliver, Meghan Oliver Date: 07/21/2017 Gender: Female D.O.B: 01/08/1941 Age (years): 76 Referring Provider: Loralie Champagne Height (inches): 17 Interpreting Physician: Fransico Him MD, ABSM Weight (lbs): 104 RPSGT: Jorge Ny BMI: 21 MRN: [id number] Neck Size:  CLINICAL INFORMATION Sleep Study Type: NPSG  Indication for sleep study: OSA  SLEEP STUDY TECHNIQUE As per the AASM Manual for the Scoring of Sleep and Associated Events v2.3 (April 2016) with a hypopnea requiring 4% desaturations.  The channels recorded and monitored were frontal, central and occipital EEG, electrooculogram (EOG), submentalis EMG (chin), nasal and oral airflow, thoracic and abdominal wall motion, anterior tibialis EMG, snore microphone, electrocardiogram, and pulse oximetry.  MEDICATIONS Medications self-administered by patient taken the night of the study : VITAMIN B6, ECASA, PRIMIDONE, VITAMIN B12/FOLIC ACID, NORTRIPTYLINE, MIRTAZAPINE, CRESTOR, CALTRATE W/VITAMIN D, SYNTHROID, OMEGA-3, LAVENDAR SILEXAN, RANITIDINE, PAXIL, COCOA VIA, LORAZEPAM, CEREFOLIN, LITHIUM, MEMANTINE  SLEEP ARCHITECTURE The study was initiated at 10:20:57 PM and ended at 5:15:40 AM.  Sleep onset time was 15.5 minutes and the sleep efficiency was 91.4%. The total sleep time was 379.0 minutes.  Stage REM latency was 216.5 minutes.  The patient spent 2.11% of the night in stage N1 sleep, 81.27% in stage N2 sleep, 0.00% in stage N3 and 16.62% in REM.  Alpha intrusion was absent.  Supine sleep was 24.80%.  RESPIRATORY PARAMETERS The overall apnea/hypopnea index (AHI) was 13.8 per hour. There were 31 total apneas, including 28 obstructive, 0 central and 3 mixed apneas. There were 56 hypopneas and 62 RERAs.  The AHI during Stage REM sleep was 34.3 per hour.  AHI while supine was 14.7 per hour.  The mean oxygen saturation was 95.02%. The minimum SpO2 during sleep was 82.00%.  moderate snoring was noted  during this study.  CARDIAC DATA The 2 lead EKG demonstrated sinus rhythm. The mean heart rate was 60.27 beats per minute. Other EKG findings include: PVCs.  LEG MOVEMENT DATA The total PLMS were 101 with a resulting PLMS index of 15.99. Associated arousal with leg movement index was 5.2 .  IMPRESSIONS - Mild to mdoerate obstructive sleep apnea occurred during this study (AHI = 13.8/h). - No significant central sleep apnea occurred during this study (CAI = 0.0/h). - Oxygen desaturation was noted during this study (Min O2 = 82.00%). - The patient snored with moderate snoring volume. - EKG findings include PVCs. - Mild periodic limb movements of sleep occurred during the study. Associated arousals were significant.  DIAGNOSIS - Obstructive Sleep Apnea (327.23 [G47.33 ICD-10])  RECOMMENDATIONS - Therapeutic CPAP titration to determine optimal pressure required to alleviate sleep disordered breathing. - Avoid alcohol, sedatives and other CNS depressants that may worsen sleep apnea and disrupt normal sleep architecture. - Sleep hygiene should be reviewed to assess factors that may improve sleep quality. - Weight management and regular exercise should be initiated or continued if appropriate.  Rankin, American Board of Sleep Medicine  ELECTRONICALLY SIGNED ON:  07/22/2017, 2:21 PM [place] PH: [phone]   FX: [phone] [place]

## 2017-07-25 DIAGNOSIS — F319 Bipolar disorder, unspecified: Secondary | ICD-10-CM | POA: Diagnosis not present

## 2017-07-25 DIAGNOSIS — F310 Bipolar disorder, current episode hypomanic: Secondary | ICD-10-CM | POA: Diagnosis not present

## 2017-07-25 DIAGNOSIS — F332 Major depressive disorder, recurrent severe without psychotic features: Secondary | ICD-10-CM | POA: Diagnosis not present

## 2017-07-26 ENCOUNTER — Telehealth: Payer: Self-pay | Admitting: *Deleted

## 2017-07-26 DIAGNOSIS — G4733 Obstructive sleep apnea (adult) (pediatric): Secondary | ICD-10-CM

## 2017-07-26 NOTE — Telephone Encounter (Signed)
Called and LMTCB. 

## 2017-07-26 NOTE — Telephone Encounter (Signed)
Informed patient of sleep study results and patient understanding was verbalized. Spoke to husband (Per DPR) Dr Wilkie Aye who understands the patient has sleep apnea and Dr Radford Pax recommends a CPAP Titration to treat patient's sleep apnea. Dr Beverely Low understands the patient will need to go back to the sleep lab for one more night for testing. Patient/Husband understands her sleep study will be done at [facility] sleep lab. Patient/Husband understands she will receive a sleep packet in a week or so. Patient/Husband understands to call if she does not receive the sleep packet in a timely manner. Patient/Husband agrees with treatment and thanked me for call.

## 2017-07-26 NOTE — Telephone Encounter (Signed)
-----   Message from Sueanne Margarita, MD sent at 07/22/2017  2:23 PM EDT ----- Please let patient know that they have sleep apnea and recommend CPAP titration. Please set up titration in the sleep lab.

## 2017-07-27 ENCOUNTER — Other Ambulatory Visit (HOSPITAL_COMMUNITY): Payer: Self-pay

## 2017-07-28 ENCOUNTER — Encounter (HOSPITAL_COMMUNITY): Payer: Medicare Other

## 2017-07-29 NOTE — Telephone Encounter (Signed)
Dr Velora Heckler called to ask if the patient's Titration could be moved up to December because they will be moving to [facility] for the winter months. I am awaiting a new date from the sleep lab.

## 2017-08-01 ENCOUNTER — Ambulatory Visit (HOSPITAL_COMMUNITY)
Admission: RE | Admit: 2017-08-01 | Discharge: 2017-08-01 | Disposition: A | Discharge status: Home / Self Care | Payer: Medicare Other | Source: Ambulatory Visit | Attending: Cardiology | Admitting: Cardiology

## 2017-08-01 ENCOUNTER — Other Ambulatory Visit: Payer: Self-pay | Admitting: Cardiology

## 2017-08-01 ENCOUNTER — Encounter (HOSPITAL_COMMUNITY): Payer: Self-pay | Admitting: Cardiology

## 2017-08-01 VITALS — BP 139/71 | HR 84 | Wt 109.2 lb

## 2017-08-01 DIAGNOSIS — K219 Gastro-esophageal reflux disease without esophagitis: Secondary | ICD-10-CM | POA: Diagnosis not present

## 2017-08-01 DIAGNOSIS — Z7982 Long term (current) use of aspirin: Secondary | ICD-10-CM | POA: Diagnosis not present

## 2017-08-01 DIAGNOSIS — E782 Mixed hyperlipidemia: Secondary | ICD-10-CM | POA: Diagnosis not present

## 2017-08-01 DIAGNOSIS — Z8249 Family history of ischemic heart disease and other diseases of the circulatory system: Secondary | ICD-10-CM | POA: Diagnosis not present

## 2017-08-01 DIAGNOSIS — F310 Bipolar disorder, current episode hypomanic: Secondary | ICD-10-CM | POA: Diagnosis not present

## 2017-08-01 DIAGNOSIS — E785 Hyperlipidemia, unspecified: Secondary | ICD-10-CM | POA: Diagnosis not present

## 2017-08-01 DIAGNOSIS — I069 Rheumatic aortic valve disease, unspecified: Secondary | ICD-10-CM | POA: Diagnosis not present

## 2017-08-01 DIAGNOSIS — F319 Bipolar disorder, unspecified: Secondary | ICD-10-CM | POA: Insufficient documentation

## 2017-08-01 DIAGNOSIS — Z79899 Other long term (current) drug therapy: Secondary | ICD-10-CM | POA: Insufficient documentation

## 2017-08-01 DIAGNOSIS — I10 Essential (primary) hypertension: Secondary | ICD-10-CM

## 2017-08-01 DIAGNOSIS — R001 Bradycardia, unspecified: Secondary | ICD-10-CM | POA: Diagnosis not present

## 2017-08-01 DIAGNOSIS — R918 Other nonspecific abnormal finding of lung field: Secondary | ICD-10-CM | POA: Diagnosis not present

## 2017-08-01 DIAGNOSIS — M542 Cervicalgia: Secondary | ICD-10-CM | POA: Insufficient documentation

## 2017-08-01 DIAGNOSIS — Z87891 Personal history of nicotine dependence: Secondary | ICD-10-CM | POA: Diagnosis not present

## 2017-08-01 DIAGNOSIS — I2510 Atherosclerotic heart disease of native coronary artery without angina pectoris: Secondary | ICD-10-CM

## 2017-08-01 DIAGNOSIS — F332 Major depressive disorder, recurrent severe without psychotic features: Secondary | ICD-10-CM | POA: Diagnosis not present

## 2017-08-01 DIAGNOSIS — Z9889 Other specified postprocedural states: Secondary | ICD-10-CM | POA: Diagnosis not present

## 2017-08-01 DIAGNOSIS — G4733 Obstructive sleep apnea (adult) (pediatric): Secondary | ICD-10-CM | POA: Insufficient documentation

## 2017-08-01 DIAGNOSIS — I6529 Occlusion and stenosis of unspecified carotid artery: Secondary | ICD-10-CM | POA: Insufficient documentation

## 2017-08-01 DIAGNOSIS — E7849 Other hyperlipidemia: Secondary | ICD-10-CM | POA: Diagnosis not present

## 2017-08-01 MED ORDER — AMLODIPINE BESYLATE 5 MG PO TABS: 5.0000 mg | ORAL_TABLET | Freq: Every day | ORAL | 0 refills | Status: DC

## 2017-08-01 NOTE — Progress Notes (Signed)
Excellent evaluation. Thanks

## 2017-08-01 NOTE — Telephone Encounter (Signed)
Appointment Reclast r/s 08/03/17

## 2017-08-01 NOTE — Progress Notes (Signed)
Patient ID: Meghan Oliver, female   DOB: 08/18/1941, 76 y.o.   MRN: [id number] PCP: Dr. Renold Genta Cardiology: Dr. Aundra Dubin  76 yo with history of bipolar disorder, carotid stenosis, HTN, CAD, and hyperlipidemia presents for followup of CAD.  Last echo in 8/18 showed normal EF, mild AS, moderate AI.  She had DES to pLAD in 8/18.  She has severe depression and gets periodic ECT at [facility].   For about 3 years, she has had episodes of burning in her lower neck , sometimes radiating up.  These episodes have no particular trigger (not meals or exercise).  In the past, her family had thought they represented GERD and she has been on a PPI and H-2 blocker as well as Mylanta prn.  She will have some relief but not always complete when she takes Mylanta.  Over the last few months, she has had more of the lower neck burning episodes.  They can last up to an hour at a time and sometimes occur daily.  She had a particularly bad episode in 7/18 and went to the ER.  She had a coronary CTA that showed extensive calcification in the proximal to mid LAD, no definite severe stenosis but cannot rule out.  The study quality was not good enough to interpret by FFR.  Therefore, I took her for coronary angiography in 8/18.  This showed a 90% proximal LAD stenosis that was treated with DES.  Unfortunately, coronary intervention did not stop her symptoms.  She continues to have the same periodic burning sensation in chest and lower neck, not exertional.  This is actually happening less frequently now.   The day after her cath, she got up and was standing in the bathroom.  She passed out and fell to the ground.  She was unconscious only briefly.  She has had no repetition of this episode.    In 10/18, she had ECT then developed post-procedure bradycardia (junctional rhythm).  She had received 20 mg IV labetalol with ECT.  She was monitored overnight, junctional rhythm resolved.  She was seen by EP, recommended avoiding labetalol with ECT.   Since then, she has had 3 ECT sessions with no further problems.   She has baseline dyspnea walking up a flight of steps. No orthopnea/PND.  She has had orthostatic symptoms and SBP is running in 100s at home.    She is currently not getting her anti-hypertensives as BP at home has not been elevated.  Her home nurse says that she only needs them when she goes for ECT (gets anxious and BP runs up).    Labs (4/16): K 3.3, creatinine 0.76 Labs (7/18): K 4.4, creatinine 1.02 Labs (8/18): LDL 81, HDL 119, creatinine 0.99, hgb 10.8 Labs (10/18): K 3.6, creatinine 0.77, hgb 10  PMH: 1. GERD 2. Bipolar disorder: On lithium. History of ECT.  3. HTN 4. Tremor 5. Hyperlipidemia 6. Carotid stenosis: Carotid dopplers (7/14) with 40-59% bilateral ICA stenosis.  Carotid dopplers (7/15) with 40-59% BICA stenosis.  - Carotid dopplers (8/18): Mild BICA stenosis.  7. CAD: Coronary calcium seen on CT.   - ETT (7/13) with 5'45" exercise, 71% MPHR (wanted to stop), no ischemic ECG changes but did not reach target heart rate.  - Lexiscan Cardiolite (4/16) with EF 76%, no ischemia/infarction.  - Coronary CTA (7/18): Interpretation limited by artifact.  Extensive calcification in the proximal to mid LAD, possible 50% stenosis but difficult to quantify due to artifact.   - LHC (8/18): 90%  proximal LAD stenosis => DES placed.  - Cardiolite (9/18): EF 75%, low risk study with soft tissue attenuation, no ischemia.  8. Aortic valve disorder:  Echo (2/12) with EF 55-60%, mild AI and MR, PA systolic pressure 36 mmHg.  Echo (12/14) with EF 65-70%, mild LVH, mild AI, very mild AS, PA systolic pressure 44 mmHg.  - Echo (8/18) with EF 65-70%, mild AS, moderate aortic insufficiency.  9. Lung nodules: CT chest 12/14 stable, no followup recommended.  10. Bradycardia: Had episode of junctional bradycardia associated with labetalol at time of ECT.  11. Syncope: ?orthostatic/dehydration, 8/18.  12. OSA: Mild to moderate on  10/18 sleep study.   SH: Married to [name], quit smoking in 1972, 2 daughters.  FH: Father with CAD.  ROS: All systems reviewed and negative except as per HPI.   Current Outpatient Prescriptions  Medication Sig Dispense Refill  . acetaminophen (TYLENOL) 500 MG tablet Take 1,000 mg by mouth every 6 (six) hours as needed for mild pain.    Marland Kitchen amLODipine (NORVASC) 5 MG tablet Take 1 tablet (5 mg total) by mouth daily. 15 tablet 0  . aspirin 81 MG tablet Take 81 mg by mouth at bedtime.     . Calcium Carbonate-Vitamin D (CALCIUM 600/VITAMIN D PO) Take 1 tablet by mouth 2 (two) times daily.     . cetirizine (ZYRTEC) 10 MG tablet Take 10 mg by mouth daily as needed for allergies.     . Cholecalciferol (VITAMIN D-3) 5000 units TABS Take 1 tablet by mouth daily with breakfast.    . clopidogrel (PLAVIX) 75 MG tablet Take 1 tablet (75 mg total) by mouth daily with breakfast. 30 tablet 12  . irbesartan (AVAPRO) 150 MG tablet TAKE 1 TABLET BY MOUTH DAILY 90 tablet 3  . L-Methylfolate-B12-B6-B2 (CEREFOLIN) 03-04-49-5 MG TABS Take 1-2 tablets by mouth 2 (two) times daily. Takes 2 tablets in the morning and 1 tablet at bedtime    . Lavender Oil 80 MG CAPS Take 160 mg by mouth at bedtime.    Marland Kitchen levothyroxine (SYNTHROID, LEVOTHROID) 75 MCG tablet TAKE 1 TABLET(75 MCG) BY MOUTH DAILY 90 tablet 1  . linaclotide (LINZESS) 145 MCG CAPS capsule Take 145 mcg by mouth at bedtime.    Marland Kitchen lithium carbonate (LITHOBID) 300 MG CR tablet Take 300 mg by mouth at bedtime.     Marland Kitchen LORazepam (ATIVAN) 0.5 MG tablet Take 0.5 mg by mouth every 6 (six) hours as needed for anxiety.     Marland Kitchen LORazepam (ATIVAN) 1 MG tablet Take 1 mg by mouth 2 (two) times daily.    . memantine (NAMENDA) 10 MG tablet Take 10 mg by mouth 2 (two) times daily.    . mirtazapine (REMERON) 7.5 MG tablet Take 3.25 mg by mouth at bedtime.    . Multiple Vitamins-Minerals (MULTIVITAMIN WITH MINERALS) tablet Take 1 tablet by mouth daily.      . nitroGLYCERIN  (NITROSTAT) 0.4 MG SL tablet Place 1 tablet (0.4 mg total) under the tongue every 5 (five) minutes as needed for chest pain. 25 tablet 3  . nortriptyline (PAMELOR) 25 MG capsule Take 75 mg by mouth at bedtime.    Marland Kitchen omega-3 acid ethyl esters (LOVAZA) 1 g capsule Take 1 capsule (1 g total) by mouth daily. 30 capsule 12  . Omega-3 Fatty Acids (OMEGA 3 PO) Take 1 capsule by mouth at bedtime.    Marland Kitchen OVER THE COUNTER MEDICATION Take 1 tablet by mouth 2 (two) times daily. *Cocavia*    .  pantoprazole (PROTONIX) 40 MG tablet Take 1 tablet (40 mg total) by mouth 2 (two) times daily before a meal. 60 tablet 3  . PARoxetine (PAXIL) 20 MG tablet Take 20 mg by mouth at bedtime.     . primidone (MYSOLINE) 50 MG tablet 2 tabs AM and 1 tab QHS 270 tablet 1  . Pyridoxine HCl (VITAMIN B-6) 500 MG tablet Take 500 mg by mouth 2 (two) times daily.    . ranitidine (ZANTAC) 150 MG tablet Take 1 tablet (150 mg total) by mouth 2 (two) times daily. 60 tablet 3  . rosuvastatin (CRESTOR) 40 MG tablet Take 1 tablet (40 mg total) by mouth daily. 30 tablet 6  . sennosides-docusate sodium (SENOKOT-S) 8.6-50 MG tablet Take 1 tablet by mouth at bedtime.    Marland Kitchen spironolactone (ALDACTONE) 25 MG tablet Take 0.5 tablets (12.5 mg total) by mouth daily. 45 tablet 3  . TOVIAZ 8 MG TB24 tablet TAKE 1 TABLET BY MOUTH DAILY 90 tablet 3  . vitamin B-12 (CYANOCOBALAMIN) 1000 MCG tablet Take 1,000 mcg by mouth at bedtime.     No current facility-administered medications for this encounter.     BP 139/71   Pulse 84   Wt 109 lb 4 oz (49.6 kg)   LMP  (LMP Unknown)   SpO2 100%   BMI 22.07 kg/m  General: NAD Neck: No JVD, no thyromegaly or thyroid nodule.  Lungs: Clear to auscultation bilaterally with normal respiratory effort. CV: Nondisplaced PMI.  Heart regular S1/S2, no S3/S4, 2/6 early SEM RUSB.  No peripheral edema.  No carotid bruit.  Normal pedal pulses.  Abdomen: Soft, nontender, no hepatosplenomegaly, no distention.  Skin: Intact  without lesions or rashes.  Neurologic: Alert and oriented x 3.  Psych: Normal affect. Extremities: No clubbing or cyanosis.  HEENT: Normal.   Assessment/Plan: 1. Carotid stenosis: Mild disease only on recent carotid dopplers.   2. CAD: Atypical pain (neck burning, nonexertional) for about 3 years. She had a concerning coronary CTA with extensive calcified plaque in the proximal to mid LAD. Due to artifact, hard to tell degree of stenosis and the study was not good enough to get FFR from it.  I was concerned that her symptoms could represent angina given the amount of coronary plaque she has => coronary angiography was done, showing tight proximal LAD stenosis (8/18).  This was treated with DES.  PCI did not help her symptoms (neck and chest burning).  This makes me suspect that her symptoms are GERD-related.  She had a Cardiolite post-PCI in 9/18 that showed no evidence for ischemia. - Continue ASA 81 and statin.  3. Aortic valve disorder: Mild AS, moderate AI on echo in 8/18.  Follow over time.   4. Bradycardia: Junctional bradycardia in 10/18 after getting a dose of IV labetalol.  Avoid nodal blocking agents.   5. HTN: Labile.  SBP now running in 100s and she has orthostatic symptoms.  - Decrease amlodipine to 2.5 mg daily. 6. Hyperlipidemia: Goal LDL < 70, Crestor recently increased.  Check lipids/LFTs today.  7. GERD: Chest/neck symptoms did not change with PCI.  I suspect GERD may be causing her symptoms.   8. Depression: Profound. She is getting ECT.  9. OSA: Needs CPAP titration.   Followup in 3 months.   Loralie Champagne 08/01/2017

## 2017-08-01 NOTE — Patient Instructions (Signed)
Decrease Amlodipine 2.5 mg (1/2 tab) daily  Your physician recommends that you return for lab work in: Rx given  Your physician recommends that you schedule a follow-up appointment in: 3 months (we will call you)

## 2017-08-03 ENCOUNTER — Ambulatory Visit (HOSPITAL_BASED_OUTPATIENT_CLINIC_OR_DEPARTMENT_OTHER): Payer: Medicare Other

## 2017-08-03 ENCOUNTER — Inpatient Hospital Stay (HOSPITAL_COMMUNITY): Admission: RE | Admit: 2017-08-03 | Payer: Medicare Other | Source: Ambulatory Visit

## 2017-08-03 DIAGNOSIS — Z23 Encounter for immunization: Secondary | ICD-10-CM | POA: Diagnosis not present

## 2017-08-03 NOTE — Telephone Encounter (Signed)
Informed patient/Husband of Titration study results and patient understanding was verbalized. Patient understands she has sleep apnea and Dr Radford Pax recommends a CPAP Titration in lab. Patient understands her Titration is scheduled for Wednesday 08/03/2017. Patient understands her titration will be done at [facility] sleep lab. Patient understands she will receive a sleep packet in a week or so. Patient understands to call if she does not receive the sleep packet in a timely manner. Patient agrees with treatment and thanked me for call.

## 2017-08-04 ENCOUNTER — Encounter: Payer: Self-pay | Admitting: Obstetrics & Gynecology

## 2017-08-04 ENCOUNTER — Ambulatory Visit (INDEPENDENT_AMBULATORY_CARE_PROVIDER_SITE_OTHER): Payer: Medicare Other | Admitting: Obstetrics & Gynecology

## 2017-08-04 ENCOUNTER — Other Ambulatory Visit: Payer: Self-pay | Admitting: Obstetrics & Gynecology

## 2017-08-04 ENCOUNTER — Ambulatory Visit (INDEPENDENT_AMBULATORY_CARE_PROVIDER_SITE_OTHER): Payer: Medicare Other

## 2017-08-04 DIAGNOSIS — R102 Pelvic and perineal pain: Secondary | ICD-10-CM

## 2017-08-04 DIAGNOSIS — Z780 Asymptomatic menopausal state: Secondary | ICD-10-CM

## 2017-08-04 DIAGNOSIS — N83202 Unspecified ovarian cyst, left side: Secondary | ICD-10-CM

## 2017-08-04 MED ORDER — SULFAMETHOXAZOLE-TRIMETHOPRIM 800-160 MG PO TABS: 1.0000 | ORAL_TABLET | Freq: Two times a day (BID) | ORAL | 0 refills | Status: AC

## 2017-08-04 NOTE — Telephone Encounter (Signed)
Accidental overdose of what?  Patient will need to have normal renal function before Reclast.

## 2017-08-04 NOTE — Telephone Encounter (Signed)
Rescheduled appointment RECLAST due to Accidental overdose with elevated BUN and Creatinine talked with Randol Kern, RN she is monitoring renal function . Will call back in December to reschedule RECLAST with Dr Zelphia Cairo approval.

## 2017-08-04 NOTE — Telephone Encounter (Signed)
ER visit on 07/13/17  Took three day doses of her medication in one night labs on these 07/13/17. [name] Card RN stated that [facility] are doing labs due to ECT treatments at [facility]. She will keep you and Elon Alas informed for re schedule . I told her that I would relay to you. N Young ordered Reclast months ago but delayed due to dental surgery

## 2017-08-04 NOTE — Progress Notes (Signed)
    Meghan Oliver 1940-12-18 [id number]        76 y.o.  [id number]  Married to Dr Lyla Son.  Accompanied by her Care Giver.  RP:  Midline pelvic pain which started last night  HPI:  Menopause, no HRT.  No PMB.  No loss of appetitie.  No vomiting.  Treating constipation.  Had a BM yesterday.  Pain is intermittent, worse when ambulating.  Some urinary frequency, but denies burning with miction.  No fever.  CV disease.  Bipolar disorder.  Impaired short term memory.  Past medical history,surgical history, problem list, medications, allergies, family history and social history were all reviewed and documented in the EPIC chart.  Directed ROS with pertinent positives and negatives documented in the history of present illness/assessment and plan.  Exam:  There were no vitals filed for this visit. General appearance:  Normal  Abdomen:  Soft, NT, no distention.  Gyn exam:  Vulva normal.  Bimanual exam:  Uterus Normal size/AV/NT/Mobile.  Adnexa: No mass felt, NT.  Hard stool present in rectum.  Pelvic US today:  T/V Anteverted uterus 4.85 x 4.05 x 2.45 cm, homogeneous.  Thin endometrial line at 1.0 mm.  Right ovary seen with a 2.0 x 0.6 cm tubular echo-free fluid filled cystic structure.  Left ovary rim of tissue seen with a thin-walled coma shaped echo-free cyst 2.9 x 2.4 x 2.2 cm.  Negative CFD.  Arterial blood flow present to left ovary.  No FF in CDS.  Excessive lumen loops of bowels seen in both adnexae.   U/A WBC 6-10, RBC 0-2, Few bacteria, Nit Negative.  Assessment/Plan:  76 y.o. G2P2002    1. Pelvic pain in female Probably secondary to constipation with extra gas in bowels.  U/A mildly perturbed, pending Urine Culture.  Will treat with Bactrim x 3 days.  Small benign appearing Lt Ovarian cyst unlikely the cause of her pain. - US Transvaginal Non-OB - Urinalysis with Culture Reflex  2. Cyst of left ovary Small Rt Hydrosalpinx.  Small simple benign appearing Lt Ovarian cyst.  Ca 125  wnl at 28. - CA 125  Counseling on above issues >50% x 25 minutes.  Princess Bruins MD, 2:40 PM 08/04/2017    z

## 2017-08-05 ENCOUNTER — Ambulatory Visit (HOSPITAL_BASED_OUTPATIENT_CLINIC_OR_DEPARTMENT_OTHER): Payer: Medicare Other | Attending: Cardiology | Admitting: Cardiology

## 2017-08-05 ENCOUNTER — Other Ambulatory Visit: Payer: Self-pay | Admitting: Internal Medicine

## 2017-08-05 VITALS — Ht 59.0 in | Wt 104.0 lb

## 2017-08-05 DIAGNOSIS — G4733 Obstructive sleep apnea (adult) (pediatric): Secondary | ICD-10-CM | POA: Diagnosis not present

## 2017-08-05 LAB — CA 125: CA 125: 28 U/mL (ref <35)

## 2017-08-06 NOTE — Procedures (Signed)
   Patient Name: Meghan Oliver, Meghan Oliver Date: 08/05/2017 Gender: Female D.O.B: 1941-03-28 Age (years): 34 Referring Provider: Fransico Him MD, ABSM Height (inches): 59 Interpreting Physician: Fransico Him MD, ABSM Weight (lbs): 104 RPSGT: Jorge Ny BMI: 21 MRN: [id number] Neck Size: 14.00  CLINICAL INFORMATION The patient is referred for a CPAP titration to treat sleep apnea.  SLEEP STUDY TECHNIQUE As per the AASM Manual for the Scoring of Sleep and Associated Events v2.3 (April 2016) with a hypopnea requiring 4% desaturations.  The channels recorded and monitored were frontal, central and occipital EEG, electrooculogram (EOG), submentalis EMG (chin), nasal and oral airflow, thoracic and abdominal wall motion, anterior tibialis EMG, snore microphone, electrocardiogram, and pulse oximetry. Continuous positive airway pressure (CPAP) was initiated at the beginning of the study and titrated to treat sleep-disordered breathing.  MEDICATIONS Medications self-administered by patient taken the night of the study : VITAMIN B6, ECASA, PRIMIDONE, VITAMIN B12/FOLIC ACID, NORTRIPTYLINE, MIRTAZAPINE, CRESTOR, CALTRATE W/VITAMIN D, SYNTHROID, OMEGA-3, LAVENDAR SILEXAN, RANITIDINE, PAXIL, COCOA VIA, LORAZEPAM, CEREFOLIN, LITHIUM, MEMANTINE  TECHNICIAN COMMENTS Comments added by technician: NONE  Comments added by scorer: N/A  RESPIRATORY PARAMETERS Optimal PAP Pressure (cm): 14  AHI at Optimal Pressure (/hr):6.4 Overall Minimal O2 (%):87.00  Supine % at Optimal Pressure (%):0 Minimal O2 at Optimal Pressure (%): 89.0    SLEEP ARCHITECTURE The study was initiated at 9:36:55 PM and ended at 5:49:58 AM.  Sleep onset time was 41.0 minutes and the sleep efficiency was 56.8%. The total sleep time was 280.0 minutes.  The patient spent 2.32% of the night in stage N1 sleep, 83.21% in stage N2 sleep, 0.00% in stage N3 and 14.46% in REM.Stage REM latency was 147.5 minutes  Wake after sleep  onset was 172.1. Alpha intrusion was absent. Supine sleep was 59.29%.  CARDIAC DATA The 2 lead EKG demonstrated sinus rhythm. The mean heart rate was 60.67 beats per minute. Other EKG findings include: none.  LEG MOVEMENT DATA The total Periodic Limb Movements of Sleep (PLMS) were 29. The PLMS index was 6.21. A PLMS index of <15 is considered normal in adults.  IMPRESSIONS - The optimal PAP pressure was 14 cm of water. - Central sleep apnea was not noted during this titration (CAI = 0.0/h). - Mild oxygen desaturations were observed during this titration (min O2 = 87.00%). - The patient snored with soft snoring volume during this titration study. - Mild periodic limb movements were observed during this study. Arousals associated with PLMs were rare.  DIAGNOSIS - Obstructive Sleep Apnea (327.23 [G47.33 ICD-10])  RECOMMENDATIONS - Trial of CPAP therapy on 14 cm H2O with a Small size Resmed Full Face Mask AirFit F20 mask and heated humidification. - Avoid alcohol, sedatives and other CNS depressants that may worsen sleep apnea and disrupt normal sleep architecture. - Sleep hygiene should be reviewed to assess factors that may improve sleep quality. - Weight management and regular exercise should be initiated or continued. - Return to Sleep Center for re-evaluation after 10 weeks of therapy  [place], [place] [facility]  ELECTRONICALLY SIGNED ON:  08/06/2017, 9:07 PM [place] PH: [phone]   FX: [phone] [place]

## 2017-08-06 NOTE — Patient Instructions (Signed)
1. Pelvic pain in female Probably secondary to constipation with extra gas in bowels.  U/A mildly perturbed, pending Urine Culture.  Will treat with Bactrim x 3 days.  Small benign appearing Lt Ovarian cyst unlikely the cause of her pain. - US Transvaginal Non-OB - Urinalysis with Culture Reflex  2. Cyst of left ovary Small Rt Hydrosalpinx.  Small simple benign appearing Lt Ovarian cyst.  Ca 125 wnl at 28. - CA 125  Meghan Oliver, it was a pleasure meeting you today!  I will inform you of your results as soon as available.

## 2017-08-07 LAB — URINE CULTURE
MICRO NUMBER:: 81232450
SPECIMEN QUALITY:: ADEQUATE

## 2017-08-07 LAB — URINALYSIS W MICROSCOPIC + REFLEX CULTURE
BILIRUBIN URINE: NEGATIVE
Glucose, UA: NEGATIVE
HYALINE CAST: NONE SEEN /LPF
Ketones, ur: NEGATIVE
NITRITES URINE, INITIAL: NEGATIVE
PH: 6 (ref 5.0–8.0)
Protein, ur: NEGATIVE
SPECIFIC GRAVITY, URINE: 1.012 (ref 1.001–1.03)

## 2017-08-07 LAB — CULTURE INDICATED

## 2017-08-08 ENCOUNTER — Other Ambulatory Visit (INDEPENDENT_AMBULATORY_CARE_PROVIDER_SITE_OTHER): Payer: Medicare Other

## 2017-08-08 ENCOUNTER — Ambulatory Visit (INDEPENDENT_AMBULATORY_CARE_PROVIDER_SITE_OTHER): Payer: Medicare Other | Admitting: Physician Assistant

## 2017-08-08 ENCOUNTER — Encounter: Payer: Self-pay | Admitting: Physician Assistant

## 2017-08-08 VITALS — BP 130/68 | HR 84 | Ht 59.0 in | Wt 109.6 lb

## 2017-08-08 DIAGNOSIS — R1084 Generalized abdominal pain: Secondary | ICD-10-CM

## 2017-08-08 DIAGNOSIS — I208 Other forms of angina pectoris: Secondary | ICD-10-CM | POA: Diagnosis not present

## 2017-08-08 DIAGNOSIS — R1110 Vomiting, unspecified: Secondary | ICD-10-CM | POA: Diagnosis not present

## 2017-08-08 DIAGNOSIS — R6881 Early satiety: Secondary | ICD-10-CM

## 2017-08-08 DIAGNOSIS — K5900 Constipation, unspecified: Secondary | ICD-10-CM | POA: Diagnosis not present

## 2017-08-08 DIAGNOSIS — K219 Gastro-esophageal reflux disease without esophagitis: Secondary | ICD-10-CM

## 2017-08-08 LAB — COMPREHENSIVE METABOLIC PANEL
ALBUMIN: 3.9 g/dL (ref 3.5–5.2)
ALT: 30 U/L (ref 0–35)
AST: 19 U/L (ref 0–37)
Alkaline Phosphatase: 46 U/L (ref 39–117)
BUN: 19 mg/dL (ref 6–23)
CALCIUM: 10.3 mg/dL (ref 8.4–10.5)
CHLORIDE: 96 meq/L (ref 96–112)
CO2: 26 mEq/L (ref 19–32)
CREATININE: 0.79 mg/dL (ref 0.40–1.20)
GFR: 74.89 mL/min (ref >60.00)
Glucose, Bld: 110 mg/dL — ABNORMAL HIGH (ref 70–99)
POTASSIUM: 4 meq/L (ref 3.5–5.1)
Sodium: 132 mEq/L — ABNORMAL LOW (ref 135–145)
Total Bilirubin: 0.4 mg/dL (ref 0.2–1.2)
Total Protein: 6.7 g/dL (ref 6.0–8.3)

## 2017-08-08 NOTE — Progress Notes (Signed)
Chief Complaint: Abdominal pain, Constipation, Emesis  HPI:    Meghan Oliver is a 76 year old female with a past medical history as listed below including bipolar disorder, hiatal hernia, GERD, CAD status post DES to the proximal LAD in August, who presents to clinic today with a complaint of abdominal pain, constipation and emesis.    Patient is accompanied by her husband Dr. Rachelle Hora and personal RN [name].  They do provide much of the history for her.  They explain that over the past 2-1/2-3 weeks the patient has had an increasing abdominal pain.  She has been to her GYN and had an ultrasound which was normal other than a colon full of stool.  They do note that the patient's constipation has seemed to worsen recently.  Initially, patient was using Linzess 290 mcg daily with relief of symptoms, but over the past month this has started not to help as much.  Patient has a bowel movement maybe once a week and this is with a daily Linzess 290 mcg and typically 2 Colace.  She did have a bowel movement Thursday of last week but this is the last time that anyone documented this.  Patient does describe a very small amount of gas in between bowel movements.  She describes her pain as bilateral and low on either side of her abdomen.  Patient denies any rectal bleeding.  Patient has woken up twice in the past 2-1/2 weeks and vomited.  Apparently this is "all of a sudden" and typically when the patient is asleep.  Her husband also describes that she is "writhing in pain" in the bed.    Patient and family also continues to describe dysphasgia which has been constant and reflux.  Apparently, warm fluids tend to help food go down.  It has been decided by the patient's various physicians that an EGD or further procedure will be delayed until March of next year when she would be able to come off of her blood thinner.  I am reminded that patient had a gastric emptying study which showed slow emptying.  Her husband tells  me she also has early satiety.  Patient has a decreased appetite and weight loss.    Of note per the nurse and husband, patient's depression is somewhat better after treatment at [facility].  Due to this they feel like the patient is more in touch with her body and is likely more aware of aches and pains.    Patient denies fever, chills or melena.  Past Medical History:  Diagnosis Date  . Anxiety   . Arthritis    "hips, spine" (05/16/2017)  . Bipolar II disorder ([place])   . Chronic bronchitis ([place])   . Chronic lower back pain   . Chronic right hip pain   . CKD (chronic kidney disease), stage II   . Esophagitis, erosive   . GAD (generalized anxiety disorder)   . GERD (gastroesophageal reflux disease)   . Headache    "weekly" (05/16/2017)  . Heart murmur, systolic   . History of adenomatous polyp of colon    08-04-2016  tubular adenoma  . History of electroconvulsive therapy    at [facility]--  started 04-15-2015 to 11-17-2016  total greater than 40 times  . History of hiatal hernia   . Hyperlipidemia   . Hypertension   . Hypothyroidism   . Internal carotid artery stenosis, bilateral    per last duplex 05-01-2014  bilateral ICA 40-59%  . Major depression, chronic  ECT treatments extensive and multiple started 07/ 2016  . Memory loss    "both short and long-term; needs frequent reminders to follow instrucitons" (05/16/2017)  . Migraines    "none in years" (05/16/2017)  . OSA (obstructive sleep apnea)    per study 06/ 2012 moderate OSA    . Osteoporosis   . Pulmonary nodule    monitored by pcp    Past Surgical History:  Procedure Laterality Date  . APPENDECTOMY  1971  . AUGMENTATION MAMMAPLASTY Bilateral   . CARDIOVASCULAR STRESS TEST  01-30-2015  dr Aundra Dubin   Low risk nuclear study w/ no evidence ischemia or infarction/  normal LV funciton and wall motion , 76%  . COLONOSCOPY W/ BIOPSIES AND POLYPECTOMY  "multiple"  . COLONOSCOPY WITH ESOPHAGOGASTRODUODENOSCOPY (EGD)  last one  08-04-2016  . CORONARY ANGIOPLASTY WITH STENT PLACEMENT  05/16/2017   "LAD"  . ESOPHAGOGASTRODUODENOSCOPY  02-26-04  . ESOPHAGOGASTRODUODENOSCOPY  "multiple"  . OVARIAN CYST SURGERY  1970s   Laparotomy   . PORT-A-CATH PLACEMENT  05/31/2016   "@ [facility]; for ECT series"  . TRANSTHORACIC ECHOCARDIOGRAM  09/05/2013  dr Aundra Dubin   mild LVH, ef 16-01%, grade 1 diastolic dysfunction/  very mild AV stenosis with mild AR/  trivial MR and PT/ mild to moderate LAE/ mild TR/ mild pulmonary hypertension with PA peak pressure 27mmHg  . TUBAL LIGATION    . VAGINAL HYSTERECTOMY     "partial"    Current Outpatient Medications  Medication Sig Dispense Refill  . acetaminophen (TYLENOL) 500 MG tablet Take 1,000 mg by mouth every 6 (six) hours as needed for mild pain.    Marland Kitchen amLODipine (NORVASC) 5 MG tablet Take 1 tablet (5 mg total) by mouth daily. 15 tablet 0  . aspirin 81 MG tablet Take 81 mg by mouth at bedtime.     . Calcium Carbonate-Vitamin D (CALCIUM 600/VITAMIN D PO) Take 1 tablet by mouth 2 (two) times daily.     . cetirizine (ZYRTEC) 10 MG tablet Take 10 mg by mouth daily as needed for allergies.     . Cholecalciferol (VITAMIN D-3) 5000 units TABS Take 1 tablet by mouth daily with breakfast.    . clopidogrel (PLAVIX) 75 MG tablet Take 1 tablet (75 mg total) by mouth daily with breakfast. 30 tablet 12  . irbesartan (AVAPRO) 150 MG tablet TAKE 1 TABLET BY MOUTH DAILY 90 tablet 3  . irbesartan (AVAPRO) 150 MG tablet TAKE 1 TABLET(150 MG) BY MOUTH DAILY 30 tablet 11  . L-Methylfolate-B12-B6-B2 (CEREFOLIN) 03-04-49-5 MG TABS Take 1-2 tablets by mouth 2 (two) times daily. Takes 2 tablets in the morning and 1 tablet at bedtime    . Lavender Oil 80 MG CAPS Take 160 mg by mouth at bedtime.    Marland Kitchen levothyroxine (SYNTHROID, LEVOTHROID) 75 MCG tablet TAKE 1 TABLET(75 MCG) BY MOUTH DAILY 90 tablet 1  . linaclotide (LINZESS) 145 MCG CAPS capsule Take 145 mcg by mouth at bedtime.    Marland Kitchen lithium carbonate (LITHOBID) 300 MG  CR tablet Take 300 mg by mouth at bedtime.     Marland Kitchen LORazepam (ATIVAN) 0.5 MG tablet Take 0.5 mg by mouth every 6 (six) hours as needed for anxiety.     Marland Kitchen LORazepam (ATIVAN) 1 MG tablet Take 1 mg by mouth 2 (two) times daily.    . memantine (NAMENDA) 10 MG tablet Take 10 mg by mouth 2 (two) times daily.    . mirtazapine (REMERON) 7.5 MG tablet Take 3.25 mg by mouth at  bedtime.    . Multiple Vitamins-Minerals (MULTIVITAMIN WITH MINERALS) tablet Take 1 tablet by mouth daily.      . nitroGLYCERIN (NITROSTAT) 0.4 MG SL tablet Place 1 tablet (0.4 mg total) under the tongue every 5 (five) minutes as needed for chest pain. 25 tablet 3  . nortriptyline (PAMELOR) 25 MG capsule Take 75 mg by mouth at bedtime.    Marland Kitchen omega-3 acid ethyl esters (LOVAZA) 1 g capsule Take 1 capsule (1 g total) by mouth daily. 30 capsule 12  . Omega-3 Fatty Acids (OMEGA 3 PO) Take 1 capsule by mouth at bedtime.    Marland Kitchen OVER THE COUNTER MEDICATION Take 1 tablet by mouth 2 (two) times daily. *Cocavia*    . pantoprazole (PROTONIX) 40 MG tablet Take 1 tablet (40 mg total) by mouth 2 (two) times daily before a meal. 60 tablet 3  . PARoxetine (PAXIL) 20 MG tablet Take 20 mg by mouth at bedtime.     . primidone (MYSOLINE) 50 MG tablet 2 tabs AM and 1 tab QHS 270 tablet 1  . Pyridoxine HCl (VITAMIN B-6) 500 MG tablet Take 500 mg by mouth 2 (two) times daily.    . ranitidine (ZANTAC) 150 MG tablet Take 1 tablet (150 mg total) by mouth 2 (two) times daily. 60 tablet 3  . rosuvastatin (CRESTOR) 40 MG tablet Take 1 tablet (40 mg total) by mouth daily. 30 tablet 6  . sennosides-docusate sodium (SENOKOT-S) 8.6-50 MG tablet Take 1 tablet by mouth at bedtime.    Marland Kitchen spironolactone (ALDACTONE) 25 MG tablet Take 0.5 tablets (12.5 mg total) by mouth daily. 45 tablet 3  . TOVIAZ 8 MG TB24 tablet TAKE 1 TABLET BY MOUTH DAILY 90 tablet 1  . vitamin B-12 (CYANOCOBALAMIN) 1000 MCG tablet Take 1,000 mcg by mouth at bedtime.     No current facility-administered  medications for this visit.     Allergies as of 08/08/2017 - Review Complete 08/08/2017  Allergen Reaction Noted  . Propranolol Other (See Comments) 08/06/2013  . Brexpiprazole Other (See Comments) 11/27/2015    Family History  Problem Relation Age of Onset  . Heart attack Father 37       deceased  . Hypertension Father   . Heart disease Father   . Breast cancer Paternal Grandmother        Age unknown  . Breast cancer Paternal Aunt        Age 63's  . Colon cancer Neg Hx     Social History   Socioeconomic History  . Marital status: Married    Spouse name: Dr. Lyla Son  . Number of children: 2  . Years of education: Not on file  . Highest education level: Not on file  Social Needs  . Financial resource strain: Not on file  . Food insecurity - worry: Not on file  . Food insecurity - inability: Not on file  . Transportation needs - medical: Not on file  . Transportation needs - non-medical: Not on file  Occupational History  . Occupation: housewife    Employer: UNEMPLOYED  Tobacco Use  . Smoking status: Former Smoker    Packs/day: 2.00    Years: 15.00    Pack years: 30.00    Types: Cigarettes    Last attempt to quit: 01/13/1971    Years since quitting: 46.6  . Smokeless tobacco: Never Used  Substance and Sexual Activity  . Alcohol use: Yes    Alcohol/week: 0.0 oz    Comment: 05/16/2017 "nothing  in years"  . Drug use: No  . Sexual activity: Not Currently    Comment: intercourse age 58, sexual partners less than 5  Other Topics Concern  . Not on file  Social History Narrative  . Not on file    Review of Systems:    Constitutional: No fever or chills Cardiovascular: No chest pain   Respiratory: No SOB  Gastrointestinal: See HPI and otherwise negative   Physical Exam:  Vital signs: BP 130/68   Pulse 84   Ht 4\' 11"  (1.499 m)   Wt 109 lb 9.6 oz (49.7 kg)   LMP  (LMP Unknown)   BMI 22.14 kg/m    Constitutional:   Pleasant Caucasian female appears to  be in NAD, Well developed, Well nourished, alert and cooperative Respiratory: Respirations even and unlabored. Lungs clear to auscultation bilaterally.   No wheezes, crackles, or rhonchi.  Cardiovascular: Normal S1, S2. No MRG. Regular rate and rhythm. No peripheral edema, cyanosis or pallor.  Gastrointestinal:  Soft, nondistended, marked ttp in b/l lower quadrants, mild ttp in epigastrum. No rebound or guarding. Normal bowel sounds. No appreciable masses or hepatomegaly. Rectal:  External exam: no hemorrhoids, fissure or tag; Internal exam: small amount of soft brown stool in rectal vault, decreased sphincter tone  Psychiatric:  Demonstrates good judgement and reason without abnormal affect or behaviors.  MOST RECENT LABS AND IMAGING: CBC    Component Value Date/Time   WBC 6.9 07/16/2017 0541   RBC 3.26 (L) 07/16/2017 0541   HGB 10.0 (L) 07/16/2017 0541   HGB 10.2 (L) 04/20/2017 1151   HCT 30.7 (L) 07/16/2017 0541   HCT 29.6 (L) 04/20/2017 1151   PLT 201 07/16/2017 0541   PLT 385 (H) 04/20/2017 1151   MCV 94.2 07/16/2017 0541   MCV 89 04/20/2017 1151   MCH 30.7 07/16/2017 0541   MCHC 32.6 07/16/2017 0541   RDW 12.4 07/16/2017 0541   RDW 13.9 04/20/2017 1151   LYMPHSABS 1.5 04/20/2017 1151   MONOABS 0.5 03/09/2017 1232   EOSABS 0.3 04/20/2017 1151   BASOSABS 0.0 04/20/2017 1151    CMP     Component Value Date/Time   NA 136 07/13/2017 1823   NA 136 04/20/2017 1151   K 3.6 07/13/2017 1823   CL 108 07/13/2017 1823   CO2 22 07/13/2017 1823   GLUCOSE 88 07/13/2017 1823   BUN 19 07/13/2017 1823   BUN 24 04/20/2017 1151   CREATININE 0.87 07/16/2017 0541   CREATININE 0.99 (H) 11/16/2016 1540   CALCIUM 9.1 07/13/2017 1823   PROT 6.2 (L) 07/13/2017 1431   PROT 6.2 04/20/2017 1151   ALBUMIN 3.6 07/13/2017 1431   ALBUMIN 3.9 04/20/2017 1151   AST 32 07/13/2017 1431   ALT 59 (H) 07/13/2017 1431   ALKPHOS 40 07/13/2017 1431   BILITOT 0.2 (L) 07/13/2017 1431   BILITOT <0.2  04/20/2017 1151   GFRNONAA >60 07/16/2017 0541   GFRNONAA 47 (L) 09/02/2016 1224   GFRAA >60 07/16/2017 0541   GFRAA 54 (L) 09/02/2016 1224    Assessment: 1. Constipation: Chronic for the patient, worse over the past month, currently unhelped by Linzess 290 mcg daily and Colace, has tried Amitiza high-strength twice daily in the past with no effect 2. Abdominal pain: Consider relation to above versus possible obstruction versus other 3.  Emesis: Consider relation to constipation above versus dysphagia versus a gastric emptying 4. Early satiety: consider relation to gastroparesis vs constipation vs other 5. GERD: continues  Plan: 1.  Ordered a CT abdomen pelvis with contrast for further evaluation of abdominal pain. 2.  Order CMP to check patient's kidney function prior to CT above. 3.  Patient to follow in clinic per recommendations after imaging above with Dr. Silverio Decamp.  Ellouise Newer, PA-C [facility] 08/08/2017, 3:23 PM  Cc: [name], MD

## 2017-08-08 NOTE — Patient Instructions (Signed)
Your physician has requested that you go to the basement for the following lab work before leaving today: [place] have been scheduled for a CT scan of the abdomen and pelvis at [facility] ([address]---this is in the same building as [facility]).   You are scheduled on 08-09-17 at 4:15 pm. You should arrive 15 minutes prior to your appointment time for registration. Please follow the written instructions below on the day of your exam:  WARNING: IF YOU ARE ALLERGIC TO IODINE/X-RAY DYE, PLEASE NOTIFY RADIOLOGY IMMEDIATELY AT [phone]! YOU WILL BE GIVEN A 13 HOUR PREMEDICATION PREP.  1) Do not eat anything after 12:15 pm (4 hours prior to your test) 2) You have been given 2 bottles of oral contrast to drink. The solution may taste better if refrigerated, but do NOT add ice or any other liquid to this solution. Shake well before drinking.    Drink 1 bottle of contrast @ 2:15 pm (2 hours prior to your exam)  Drink 1 bottle of contrast @ 3:15 pm (1 hour prior to your exam)  You may take any medications as prescribed with a small amount of water except for the following: Metformin, Glucophage, Glucovance, Avandamet, Riomet, Fortamet, Actoplus Met, Janumet, Glumetza or Metaglip. The above medications must be held the day of the exam AND 48 hours after the exam.  The purpose of you drinking the oral contrast is to aid in the visualization of your intestinal tract. The contrast solution may cause some diarrhea. Before your exam is started, you will be given a small amount of fluid to drink. Depending on your individual set of symptoms, you may also receive an intravenous injection of x-ray contrast/dye. Plan on being at [facility] for 30 minutes or longer, depending on the type of exam you are having performed.  This test typically takes 30-45 minutes to complete.  If you have any questions regarding your exam or if you need to reschedule, you may call the CT  department at [phone] between the hours of 8:00 am and 5:00 pm, Monday-Friday.  ________________________________________________________________________

## 2017-08-09 ENCOUNTER — Ambulatory Visit (INDEPENDENT_AMBULATORY_CARE_PROVIDER_SITE_OTHER)
Admission: RE | Admit: 2017-08-09 | Discharge: 2017-08-09 | Disposition: A | Discharge status: Home / Self Care | Payer: Medicare Other | Source: Ambulatory Visit | Attending: Physician Assistant | Admitting: Physician Assistant

## 2017-08-09 DIAGNOSIS — R1084 Generalized abdominal pain: Secondary | ICD-10-CM | POA: Diagnosis not present

## 2017-08-09 DIAGNOSIS — K5900 Constipation, unspecified: Secondary | ICD-10-CM

## 2017-08-09 DIAGNOSIS — K5792 Diverticulitis of intestine, part unspecified, without perforation or abscess without bleeding: Secondary | ICD-10-CM | POA: Diagnosis not present

## 2017-08-09 MED ORDER — IOPAMIDOL (ISOVUE-300) INJECTION 61%
100.0000 mL | Freq: Once | INTRAVENOUS | Status: AC | PRN
  Administered 2017-08-09: 100 mL via INTRAVENOUS

## 2017-08-10 ENCOUNTER — Encounter: Payer: Self-pay | Admitting: Gynecology

## 2017-08-11 ENCOUNTER — Telehealth: Payer: Self-pay | Admitting: Gastroenterology

## 2017-08-11 ENCOUNTER — Telehealth: Payer: Self-pay | Admitting: *Deleted

## 2017-08-11 DIAGNOSIS — F3341 Major depressive disorder, recurrent, in partial remission: Secondary | ICD-10-CM | POA: Diagnosis not present

## 2017-08-11 NOTE — Telephone Encounter (Signed)
LMTCB

## 2017-08-11 NOTE — Telephone Encounter (Signed)
-----   Message from Sueanne Margarita, MD sent at 08/06/2017  9:10 PM EDT ----- Please let patient know that they had a successful PAP titration and let DME know that orders are in EPIC.  Please set up 10 week OV with me.

## 2017-08-11 NOTE — Telephone Encounter (Signed)
Called Dr Lyla Son and discussed results of CT abd & pelvis. No abdominal pain currently. Symptoms resolved after she had multiple bowel movements and resolution of constipation. ?mild diverticulitis on CT but clinically no symptoms to suggest diverticulitis, will hold off antibiotics. Given she continues to have persistent constipation despite high dose Linzess, will switch to Trulance. Please send the samples for both 3mg  and 6mg  dose and try for 1 week to see which dose is more effective.     Damaris Hippo , MD [phone] Mon-Fri 8a-5p [phone] after 5p, weekends, holidays

## 2017-08-12 DIAGNOSIS — G4730 Sleep apnea, unspecified: Secondary | ICD-10-CM | POA: Diagnosis not present

## 2017-08-12 DIAGNOSIS — I2510 Atherosclerotic heart disease of native coronary artery without angina pectoris: Secondary | ICD-10-CM | POA: Diagnosis not present

## 2017-08-12 DIAGNOSIS — F332 Major depressive disorder, recurrent severe without psychotic features: Secondary | ICD-10-CM | POA: Diagnosis not present

## 2017-08-12 DIAGNOSIS — I10 Essential (primary) hypertension: Secondary | ICD-10-CM | POA: Diagnosis not present

## 2017-08-12 NOTE — Progress Notes (Signed)
Reviewed and agree with documentation and assessment and plan. K. Veena Locke Barrell , MD   

## 2017-08-15 NOTE — Telephone Encounter (Signed)
(  Per DPR) Informed patient/ Dr Velora Heckler of titration results and verbalized understanding was indicated. Patient/Dr Velora Heckler understands she had a successful CPAP Titration and Dr Radford Pax has ordered her a CPAP. Patient/Dr Velora Heckler understands she will be contacted by [facility] to set up her cpap. She/He understands to call if [facility] does not contact her with new setup in a timely manner. She/He understands she will be called once confirmation has been received from [facility] that she has received her new machine to schedule 10 week follow up appointment.  [place] notified of new cpap order  Please add to [facility] She/He was grateful for the call and thanked me

## 2017-08-18 ENCOUNTER — Encounter: Payer: Self-pay | Admitting: Internal Medicine

## 2017-08-18 DIAGNOSIS — M8589 Other specified disorders of bone density and structure, multiple sites: Secondary | ICD-10-CM | POA: Diagnosis not present

## 2017-08-18 DIAGNOSIS — Z1231 Encounter for screening mammogram for malignant neoplasm of breast: Secondary | ICD-10-CM | POA: Diagnosis not present

## 2017-08-18 LAB — HM MAMMOGRAPHY

## 2017-08-19 ENCOUNTER — Encounter: Payer: Self-pay | Admitting: Internal Medicine

## 2017-08-22 ENCOUNTER — Encounter (HOSPITAL_COMMUNITY): Payer: Medicare Other | Admitting: Cardiology

## 2017-08-22 DIAGNOSIS — G4730 Sleep apnea, unspecified: Secondary | ICD-10-CM | POA: Diagnosis not present

## 2017-08-22 DIAGNOSIS — I2510 Atherosclerotic heart disease of native coronary artery without angina pectoris: Secondary | ICD-10-CM | POA: Diagnosis not present

## 2017-08-22 DIAGNOSIS — F332 Major depressive disorder, recurrent severe without psychotic features: Secondary | ICD-10-CM | POA: Diagnosis not present

## 2017-08-22 DIAGNOSIS — I10 Essential (primary) hypertension: Secondary | ICD-10-CM | POA: Diagnosis not present

## 2017-08-27 ENCOUNTER — Other Ambulatory Visit: Payer: Self-pay | Admitting: Internal Medicine

## 2017-08-31 ENCOUNTER — Ambulatory Visit (INDEPENDENT_AMBULATORY_CARE_PROVIDER_SITE_OTHER): Payer: Medicare Other | Admitting: Neurology

## 2017-08-31 ENCOUNTER — Encounter: Payer: Self-pay | Admitting: Neurology

## 2017-08-31 VITALS — BP 145/60 | HR 73 | Ht <= 58 in | Wt 107.0 lb

## 2017-08-31 DIAGNOSIS — G4733 Obstructive sleep apnea (adult) (pediatric): Secondary | ICD-10-CM

## 2017-08-31 DIAGNOSIS — F5101 Primary insomnia: Secondary | ICD-10-CM

## 2017-08-31 DIAGNOSIS — F3113 Bipolar disorder, current episode manic without psychotic features, severe: Secondary | ICD-10-CM

## 2017-08-31 DIAGNOSIS — I208 Other forms of angina pectoris: Secondary | ICD-10-CM | POA: Diagnosis not present

## 2017-08-31 NOTE — Progress Notes (Signed)
SLEEP MEDICINE CLINIC   Provider:  Larey Seat, [place] D  Primary Care Physician:  [name], MD   Referring Provider: [name], MD    Chief Complaint  Patient presents with  . New Patient (Initial Visit)    pt is here with caregiver and nurse, rm 10. pt had a sleep study 07/2017. pt has had increased confusion and memory. sleeps 6-8 hrs but wakes up not feeling rested. pt has been told she snores and stops breathing in sleep    HPI:  Meghan Oliver is a 76 y.o. female , seen here as referral from Dr. Renold Genta for transfer of sleep care.   Meghan Oliver is seen here in the presence of 2 supportive caretakers who provide me with additional history.  Her husband, [name], has noted her to have apnea at night but these pauses sometimes seem to be longer than they used to be. She had undergone sleep testing under the guidance of Dr. Danton Sewer in the year 2000 or 2002, was diagnosed with a rather mild form of apnea and he recommended to try a dental device.  The dental device was actually made to measure but she did not compliantly use it. The interval of the last 4 years she has suffered more often and more severe from depression in the setting of bipolar 1 disease.  She has now begun electroconvulsive therapy as of spring of this year, which has greatly affected her short-term memory and immediate recall. It has lifted her mood.  Her primary care physician, Dr. Emeline General, referred her also for a new sleep evaluation this time to [facility].  I would like to quote the results of her sleep study.  The patient was initially hydrated and a baseline polysomnography dated 21 July 2017.  Her overall apnea index was 13.8/h AHI.  To have premature ventricular contractions andnot infrequent arousals from periodic limb movement. she had a surprisingly high REM apnea index of 34.3 events per hour while supine that her apnea index was  14.7 the lowest oxygen saturation during sleep was 82% and she was described as snoring moderately.  She was tested on Lorazepam- She would not be a dental candidate due to REM accentuated apnea. The patient returned for a CPAP titration as recommended by Dr. Radford Pax had a very low sleep efficiency of only 57% of the recorded time the total sleep time of only 280 minutes all night the optimal CPAP pressure was determined to be 14 cm of water, there were no central apneas noted, did have 2 periodic limb movements, this study does not tell me if the arousals associated with periodic limb movements.  Concerning is that she was awake total time of 172 minutes after initial sleep initiation.  She was recommended to start CPAP at 14 cmH2O with a small sized ResMed fullface mask the air fit F 20.   Chief complaint according to patient : " I can's remember well- I am not sure "   Sleep habits are as follows: Over the last year Meghan Oliver bedtime has advanced, she now goes to bed by about 8.30 to 9 PM. Her husband likes to run the news channel screen TV in the mutual bedroom, which has been a problem for her initiating sleep. She uses a tempurpedic bed with elevated head of bed, she prefers to sleep on her side, with 2 pillows.    It will easily  take her an hour to actually fall asleep after going to bed, and afterwards she does have nocturia breaks 2-3 times at night, after which she can initiate sleep easily again.  She rises in the morning at about 8:30 AM plus minus an hour.  It is her husband's at present she sleeps between 8 and 10 hours at night, the patient herself is not so sure. This would certainly not be reflected in her sleep studies. She does not feel rested in AM.   Sleep medical history and family sleep history: [place]-  Bipolar 1, one brother with major depression, no other  family members affected.  Patient never suffered from night terrors or sleepwalking in childhood.  Social history:  married, 2 daughters , adult, 5 grandchildren.  RetiredBiomedical scientist. Husband is a retired Sport and exercise psychologist. former smoker - quit when her girls were in middle school. 40 ears ago. , non drinker, non caffeine.    Review of Systems: Out of a complete 14 system review, the patient complains of only the following symptoms, and all other reviewed systems are negative.   I cannot longer remember, can not pay attention to detail.  Depression, ECT, patient reportedly suffers from weight loss, bruise and bleed easily, swallowing, constipation, joint pain confusion, tremor which has been related to lithium, depression and anxiety related to bipolar 2 disorder, change in appetite.  He also carries a diagnosis of hypertension, high cholesterol, coronary heart disease  Epworth score  8- not on CPAP yet  , Fatigue severity score 54  , depression score 6/12    Social History   Socioeconomic History  . Marital status: Married    Spouse name: Dr. Lyla Son  . Number of children: 2  . Years of education: Not on file  . Highest education level: Not on file  Social Needs  . Financial resource strain: Not on file  . Food insecurity - worry: Not on file  . Food insecurity - inability: Not on file  . Transportation needs - medical: Not on file  . Transportation needs - non-medical: Not on file  Occupational History  . Occupation: housewife    Employer: UNEMPLOYED  Tobacco Use  . Smoking status: Former Smoker    Packs/day: 2.00    Years: 15.00    Pack years: 30.00    Types: Cigarettes    Last attempt to quit: 01/13/1971    Years since quitting: 46.6  . Smokeless tobacco: Never Used  Substance and Sexual Activity  . Alcohol use: Yes    Alcohol/week: 0.0 oz    Comment: 05/16/2017 "nothing in years"  . Drug use: No  . Sexual activity: Not Currently    Comment: intercourse age 69, sexual partners less than 5  Other Topics Concern  . Not on file  Social History Narrative  . Not on file    Family  History  Problem Relation Age of Onset  . Heart attack Father 49       deceased  . Hypertension Father   . Heart disease Father   . Breast cancer Paternal Grandmother        Age unknown  . Breast cancer Paternal Aunt        Age 71's  . Colon cancer Neg Hx     Past Medical History:  Diagnosis Date  . Anxiety   . Arthritis    "hips, spine" (05/16/2017)  . Bipolar II disorder ([place])   . Chronic bronchitis ([place])   . Chronic lower back  pain   . Chronic right hip pain   . CKD (chronic kidney disease), stage II   . Esophagitis, erosive   . GAD (generalized anxiety disorder)   . GERD (gastroesophageal reflux disease)   . Headache    "weekly" (05/16/2017)  . Heart murmur, systolic   . History of adenomatous polyp of colon    08-04-2016  tubular adenoma  . History of electroconvulsive therapy    at [facility]--  started 04-15-2015 to 11-17-2016  total greater than 40 times  . History of hiatal hernia   . Hyperlipidemia   . Hypertension   . Hypothyroidism   . Internal carotid artery stenosis, bilateral    per last duplex 05-01-2014  bilateral ICA 40-59%  . Major depression, chronic    ECT treatments extensive and multiple started 07/ 2016  . Memory loss    "both short and long-term; needs frequent reminders to follow instrucitons" (05/16/2017)  . Migraines    "none in years" (05/16/2017)  . OSA (obstructive sleep apnea)    per study 06/ 2012 moderate OSA    . Osteoporosis   . Pulmonary nodule    monitored by pcp    Past Surgical History:  Procedure Laterality Date  . APPENDECTOMY  1971  . AUGMENTATION MAMMAPLASTY Bilateral   . CARDIOVASCULAR STRESS TEST  01-30-2015  dr Aundra Dubin   Low risk nuclear study w/ no evidence ischemia or infarction/  normal LV funciton and wall motion , 76%  . COLONOSCOPY W/ BIOPSIES AND POLYPECTOMY  "multiple"  . COLONOSCOPY WITH ESOPHAGOGASTRODUODENOSCOPY (EGD)  last one 08-04-2016  . CORONARY ANGIOPLASTY WITH STENT PLACEMENT  05/16/2017   "LAD"  .  CORONARY STENT INTERVENTION N/A 05/16/2017   Procedure: CORONARY STENT INTERVENTION;  Surgeon: Burnell Blanks, MD;  Location: [facility];  Service: Cardiovascular;  Laterality: N/A;  . ESOPHAGOGASTRODUODENOSCOPY  02-26-04  . ESOPHAGOGASTRODUODENOSCOPY  "multiple"  . LEFT HEART CATH AND CORONARY ANGIOGRAPHY N/A 05/16/2017   Procedure: LEFT HEART CATH AND CORONARY ANGIOGRAPHY;  Surgeon: Larey Dresser, MD;  Location: [facility];  Service: Cardiovascular;  Laterality: N/A;  . OVARIAN CYST SURGERY  1970s   Laparotomy   . PORT-A-CATH PLACEMENT  05/31/2016   "@ [facility]; for ECT series"  . PORT-A-CATH REMOVAL N/A 03/11/2017   Procedure: REMOVAL PORT-A-CATH;  Surgeon: Jackolyn Confer, MD;  [facility];  Service: General;  Laterality: N/A;  . TRANSTHORACIC ECHOCARDIOGRAM  09/05/2013  dr Aundra Dubin   mild LVH, ef 01-75%, grade 1 diastolic dysfunction/  very mild AV stenosis with mild AR/  trivial MR and PT/ mild to moderate LAE/ mild TR/ mild pulmonary hypertension with PA peak pressure 34mmHg  . TUBAL LIGATION    . VAGINAL HYSTERECTOMY     "partial"    Current Outpatient Medications  Medication Sig Dispense Refill  . acetaminophen (TYLENOL) 500 MG tablet Take 1,000 mg by mouth every 6 (six) hours as needed for mild pain.    Marland Kitchen amLODipine (NORVASC) 5 MG tablet Take 1 tablet (5 mg total) by mouth daily. (Patient taking differently: 2.5 mg. Take 2.5 mg daily) 15 tablet 0  . aspirin 81 MG tablet Take 81 mg by mouth at bedtime.     . Calcium Carbonate-Vitamin D (CALCIUM 600/VITAMIN D PO) Take 1 tablet by mouth 2 (two) times daily.     . cetirizine (ZYRTEC) 10 MG tablet Take 10 mg by mouth daily as needed for allergies.     . Cholecalciferol (VITAMIN D-3) 5000  units TABS Take 1 tablet by mouth daily with breakfast.    . clopidogrel (PLAVIX) 75 MG tablet Take 1 tablet (75 mg total) by mouth daily with breakfast. (Patient taking differently: Take 75 mg daily by  mouth. ) 30 tablet 12  . irbesartan (AVAPRO) 150 MG tablet TAKE 1 TABLET BY MOUTH DAILY (Patient taking differently: Take 1 tablet by mouth prn ECT days only) 90 tablet 3  . L-Methylfolate-B12-B6-B2 (CEREFOLIN) 03-04-49-5 MG TABS Take 1-2 tablets by mouth 2 (two) times daily. Takes 2 tablets in the morning and 1 tablet at bedtime    . Lavender Oil 80 MG CAPS Take 160 mg by mouth at bedtime.    Marland Kitchen levothyroxine (SYNTHROID, LEVOTHROID) 75 MCG tablet TAKE 1 TABLET(75 MCG) BY MOUTH DAILY 90 tablet 0  . linaclotide (LINZESS) 290 MCG CAPS capsule Take 290 mcg daily before breakfast by mouth.    . lithium carbonate (ESKALITH) 450 MG CR tablet Take 300 mg by mouth at bedtime.     Marland Kitchen LORazepam (ATIVAN) 0.5 MG tablet Take 0.5 mg by mouth every 6 (six) hours as needed for anxiety.     Marland Kitchen LORazepam (ATIVAN) 1 MG tablet Take 1 mg by mouth 2 (two) times daily.    . memantine (NAMENDA) 10 MG tablet Take 10 mg by mouth 2 (two) times daily.    . mirtazapine (REMERON) 7.5 MG tablet Take 3.25 mg by mouth at bedtime.    . Multiple Vitamins-Minerals (MULTIVITAMIN WITH MINERALS) tablet Take 1 tablet by mouth daily.      . nortriptyline (PAMELOR) 25 MG capsule Take 75 mg by mouth at bedtime.    Marland Kitchen omega-3 acid ethyl esters (LOVAZA) 1 g capsule Take 1 capsule (1 g total) by mouth daily. 30 capsule 12  . Omega-3 Fatty Acids (OMEGA 3 PO) Take 1 capsule by mouth at bedtime.    Marland Kitchen OVER THE COUNTER MEDICATION Take 1 tablet by mouth 2 (two) times daily. *Cocavia*    . PARoxetine (PAXIL) 20 MG tablet Take 20 mg by mouth at bedtime.     . primidone (MYSOLINE) 50 MG tablet 2 tabs AM and 1 tab QHS 270 tablet 1  . Pyridoxine HCl (VITAMIN B-6) 500 MG tablet Take 500 mg by mouth 2 (two) times daily.    . ranitidine (ZANTAC) 150 MG tablet Take 1 tablet (150 mg total) by mouth 2 (two) times daily. 60 tablet 3  . rosuvastatin (CRESTOR) 40 MG tablet Take 1 tablet (40 mg total) by mouth daily. 30 tablet 6  . sennosides-docusate sodium  (SENOKOT-S) 8.6-50 MG tablet Take 1 tablet by mouth at bedtime.    . TOVIAZ 8 MG TB24 tablet TAKE 1 TABLET BY MOUTH DAILY 90 tablet 1  . vitamin B-12 (CYANOCOBALAMIN) 1000 MCG tablet Take 1,000 mcg by mouth at bedtime.    . nitroGLYCERIN (NITROSTAT) 0.4 MG SL tablet Place 1 tablet (0.4 mg total) under the tongue every 5 (five) minutes as needed for chest pain. 25 tablet 3   No current facility-administered medications for this visit.    I have not changed any medication - CD Allergies as of 08/31/2017 - Review Complete 08/31/2017  Allergen Reaction Noted  . Propranolol Other (See Comments) 08/06/2013  . Brexpiprazole Other (See Comments) 11/27/2015    Vitals: BP (!) 145/60   Pulse 73   Ht 4\' 10"  (1.473 m)   Wt 107 lb (48.5 kg)   LMP  (LMP Unknown)   BMI 22.36 kg/m  Last Weight:  Wt  Readings from Last 1 Encounters:  08/31/17 107 lb (48.5 kg)   MWN:UUVO mass index is 22.36 kg/m.     Last Height:   Ht Readings from Last 1 Encounters:  08/31/17 4\' 10"  (1.473 m)    Physical exam:  General: The patient is awake, alert and appears not in acute distress. The patient is well groomed. Head: Normocephalic, atraumatic. Neck is supple. Mallampati 4 neck circumference:13.5 ,  Nasal airflow patent- Retrognathia is not seen.  Cardiovascular:  Regular rate and rhythm, some irregular beats , without  murmurs or carotid bruit, and without distended neck veins. Respiratory: Lungs are clear to auscultation. Skin:  Without evidence of edema, or rash Trunk: BMI is 22.3 . The patient's posture is erect, slightly stooped.   Neurologic exam : Speech is non- fluent, mild  dysphonia - not sure about  aphasia.  Mood and affect are aloof Cranial nerves: Pupils are equal and briskly reactive to light.  Extraocular movements  in vertical and horizontal planes intact and without nystagmus. Visual fields by finger perimetry are intact. Hearing to finger rub intact.  Facial sensation intact to fine touch.  Facial motor strength is symmetric and tongue and uvula move midline. Shoulder shrug was symmetrical.   Motor exam:   Normal tone, muscle bulk and symmetric strength in all extremities.Sensory:  Fine touch, pinprick and vibration were tested in all extremities. Proprioception tested in the upper extremities was normal. Coordination: Rapid alternating movements in the fingers/hands was normal. Finger-to-nose maneuver  normal without evidence of ataxia, dysmetria or tremor. Gait and station: Patient walks without assistive device . Strength within normal limits.Stance is stable and normal Turns with 4-5   Steps.  Deep tendon reflexes: in the  upper and lower extremities are symmetric and intact. Babinski maneuver response is downgoing.    Assessment:  After physical and neurologic examination, review of laboratory studies,  Personal review of imaging studies, reports of other /same  Imaging studies, results of polysomnography and / or neurophysiology testing and pre-existing records as far as provided in visit., my assessment is :  Dear Dr. Renold Genta, Thank you for referring Meghan Oliver to my sleep clinic,  As you know the patient is a 76 year old right-handed female with a history of obstructive sleep apnea that spans at least 7 years when she was first diagnosed.  At the time she was told that she had a moderate degree and I am not sure that CPAP was ever attempted before, it seems that Dr. Gwenette Greet settled on a dental device. The dental device was produced but never used. Her most recent sleep study performed at an outside lab again indicated a rather mild degree of apnea which was a strong accentuation during REM sleep this is usually an apnea form not amendable to dental device treatment.  Considering background conditions including anxiety and depression I was surprised to find this rather petite lady being treated using a fullface mask.  CPAP also was titrated to a rather high pressure of 14  cmH2O the patient was rather mild apnea obstructive nature only.  Please allow me to order out titration device but I will set for a low pressure range between 5 and 12 cmH2O, with 2 cm EPR, also specifically asked the respiratory tech of the medical equipment company to fit her with a nasal base.  He truly develops trouble with oral air leakage could always add a chinstrap.  Most patients get used a pillow or nasal mask easily for 30  days.  She will does not be the case weekend without cost to the patient change the interface.  Since the Oliver spend Winter in [place] and I will therfor refer to a multistate DME, such as [employer] - I will send a special order to [employer], [place] in [place].     The patient was advised of the nature of the diagnosed disorder , the treatment options and the  risks for general health and wellness arising from not treating the condition.   I spent more than 45  minutes of face to face time with the patient.  Greater than 50% of time was spent in counseling and coordination of care. We have discussed the diagnosis and differential and I answered the patient's questions.    Plan:  Treatment plan and additional workup :   [facility] to provide auto titration CPAP settings 5-12 cm water, 2 cm EPR-  nasal pillow or nasal CPAP mask to be fitted, and [place] office in [facility].     Larey Seat, MD 89/21/1942, 74:08 AM  Certified in Neurology by ABPN Certified in [place] by [facility] [address] [place], [place] [address]

## 2017-09-02 DIAGNOSIS — F332 Major depressive disorder, recurrent severe without psychotic features: Secondary | ICD-10-CM | POA: Diagnosis not present

## 2017-09-05 NOTE — Progress Notes (Signed)
Meghan Oliver was seen today in the movement disorders clinic for neurologic consultation at the request of Baxley, Cresenciano Lick, MD.  The consultation is for the evaluation of tremor.  She is accompanied by her husband who supplements the history.  She supplied numerous records I had the opportunity to review.  She was a previous patient of Dr. love.  It appears that she last saw him about one year ago, in November, 2013.  The pt reports tremor x 2 years but her husband states at least 3-4 years.  It is in both hands, R worse than L.  She is R hand dominant.  It bothers her most with drinking coffee.  She has to drink out of a straw.  She cannot eat soup.  She does not drink caffeine.  Alcohol does improve tremor.  She has trouble writing because of the tremor.    The patient does have a very long history of depression.  She is currently on lithium and Zyprexa, which she has been on for 5 years and she states that both of these help.  She is status post ECT treatments began in January, 2000 and continued for 50 treatments of December, 2010.  In the past, the patient has been on Topamax for tremor, Inderal, 10 mg twice a day (this caused decrease in heart rate) and is currently on clonazepam.  She does not even remember being on the Topamax, so cannot remember if she had side effects or if it just did not work.  02/18/14 update:  Patient is seen back in followup today, accompanied by her husband as well as her nurse [name]), both of whom supplement the history.  Tremor is better than it was 6 weeks ago.  Had unsteadiness while in [place] and had to use a cane.  Has to have a railing to go up and down stairs.  Saw a neurologist in [place] last November as was becoming very sedated.  It was recommended that she decrease the lithium and she did.  Thinks that is why tremor is improved but admits that it tends to wax and wane.  They bring up a different concern.  Her husband recently discovered that the patient drinks 8-12 oz of  vodka per day.  Apparently, her nurse has been aware of this for years.  Pt saw her psychiatrist, [name], recently but didn't talk to him about this but now would like me to contact him about this.  She would like to see him before she goes on a big family trip to [place] on June 3.    04/08/14 update:  Pt is seen in f/u earlier than expected.  She is accompanied by her nurse, who supplements the history.  She was supposed to have an MRI brain since last visit but there was an error and this was not done. She had labs done since last visit and she has seen her PCP.  I reviewed those notes.  Her lithium was increased since last visit.  She is now on 150 mg in the AM and then 300 mg at night (was just 300 at night) and is now on ativan bid prn.  Asks me how many she can take per day.  They haven't noted increased tremor.  She did just get an iphone and is having trouble using the letters but her granddaughter showed her how to use the voice activated system so she is doing well with that.  Cannot eat with a spoon though  or things spill.  Depression seems worse as there are so family "things" going on.  Has an upcoming appt with [name].  Enjoyed vacation in [place].  04/29/14 update:  The patient was seen as a work in appointment today.  She is accompanied by her husband  who supplements the history.  She had an MRI of her brain on 04/18/2014.  It was essentially unremarkable.  No significant cerebellar atrophy.  Last visit, I did increase her primidone to 50 mg twice a day.  Her tremor has been increased.  She had 2 episodes where tremor was so bad that she could barely walk and she needed help down then stairs.  Drinking wine spritzers 3 days per week but no other alcohol.   Lithium dose has not changed since last visit but level has increased (as dose did change visit before that).  She reports that she is now off of ativan.    05/21/15 update:  The patient returns today.  She is accompanied by her husband  and her nurse, both of whom supplement the history.  I have not seen her in over a year.  Last visit, tremor had increased some because she had just increased her lithium.  She was also on Zyprexa.  We ended up increasing her primidone, 50 mg, to compensate so that she is taking 2 tablets in the morning and 1 in the afternoon.  I have not seen her since.  They never did that because she was leaving for [place] and the same day I saw her,  [name] decided to drop the lithium.   The patient reports that she went to [facility] and began to struggle with increasing depression and she even had psychosis.  [name] increased the zyprexa in Feb and had to increase the lithium.  She has undergone further ECT treatments for her depression.  Lithium was put on hold for that in April.  They did 12 unilateral tx's but that did not help so June 26 they started bilateral treatments for a month and restarted the lithium on July 26.  The bilateral ECT did help.  She is on 450 mg of lithium, 10 mg of zyprexa and 30 mg of prozac.  She is still on 50 mg bid of primidone.  She is c/o significant tremor.  Her husband states that when she was off the lithium tremor was much better.   I reviewed records since her last visit with [name].  09/06/17 update: Patient is seen today for her tremor.  I have not seen her in 2-1/2 years.  I have reviewed numerous records and have spoken to her family in the meantime.  She is accompanied by her caregiver, [name], who supplements the history.  She brings and reviews with me a 2-year updated history.  Her depression has gotten worse with the course of time.  She has been undergoing ECT at [facility].  They feel that this has continued to worsen her short-term memory.  However, she really has good support around her.  She is not driving.  In regards to alcohol, she is not drinking anything.  Because of the depression, she is maintained on lithium, which is the primary source of her tremor.  When she has been off of  lithium in the past, her tremor has been much improved but mood was markedly worse (near catatonic).  She is now off of Zyprexa, which she was on last visit.  Rexulti was tried since last visit but  made her catatonic.  They see [name] tomorrow and will leave for [employer] on Sunday.  However, she will be coming back every 2-3 weeks for ECT.   She is on primidone, 100 mg in the morning and 50 mg at night.   Overall, the patients nurse states that while tremor comes and goes with fatigue and anxiety, it has really been better with the course of time.   She just recently saw Dr. Brett Fairy for a second opinion for her sleep apnea.  She is to have a titration study at home with AutoPap.  She saw cardiology in August.  She had a syncopal episode.  This was the day after her heart catheterization.  She is now off of antihypertensives.  It was felt that the syncopal episode was likely orthostatic.  She has had gait instability and has had falls.  She doesn't wish to do PT.  She does daily PT.  She doesn't like to use her walker (states that her husband doesn't like her to use it).     PREVIOUS MEDICATIONS: Inderal (bradycardia), Topamax, clonazepam  ALLERGIES:   Allergies  Allergen Reactions  . Propranolol Other (See Comments)    Low blood pressure  . Brexpiprazole Other (See Comments)    Aphasia and catatonia    CURRENT MEDICATIONS:  Current Outpatient Medications on File Prior to Visit  Medication Sig Dispense Refill  . acetaminophen (TYLENOL) 500 MG tablet Take 1,000 mg by mouth every 6 (six) hours as needed for mild pain.    Marland Kitchen amLODipine (NORVASC) 5 MG tablet Take 1 tablet (5 mg total) by mouth daily. (Patient taking differently: 2.5 mg. Take 2.5 mg daily) 15 tablet 0  . aspirin 81 MG tablet Take 81 mg by mouth at bedtime.     . Calcium Carbonate-Vitamin D (CALCIUM 600/VITAMIN D PO) Take 1 tablet by mouth 2 (two) times daily.     . cetirizine (ZYRTEC) 10 MG tablet Take 10 mg by mouth daily as needed for  allergies.     . Cholecalciferol (VITAMIN D-3) 5000 units TABS Take 1 tablet by mouth daily with breakfast.    . clopidogrel (PLAVIX) 75 MG tablet Take 1 tablet (75 mg total) by mouth daily with breakfast. (Patient taking differently: Take 75 mg daily by mouth. ) 30 tablet 12  . irbesartan (AVAPRO) 150 MG tablet TAKE 1 TABLET BY MOUTH DAILY (Patient taking differently: Take 1 tablet by mouth prn ECT days only) 90 tablet 3  . L-Methylfolate-B12-B6-B2 (CEREFOLIN) 03-04-49-5 MG TABS Take 1-2 tablets by mouth 2 (two) times daily. Takes 2 tablets in the morning and 1 tablet at bedtime    . Lavender Oil 80 MG CAPS Take 160 mg by mouth at bedtime.    Marland Kitchen levothyroxine (SYNTHROID, LEVOTHROID) 75 MCG tablet TAKE 1 TABLET(75 MCG) BY MOUTH DAILY 90 tablet 0  . linaclotide (LINZESS) 290 MCG CAPS capsule Take 290 mcg daily before breakfast by mouth.    . lithium carbonate 150 MG capsule 300 mg in the morning, 150 mg at night    . LORazepam (ATIVAN) 0.5 MG tablet Take 0.5 mg by mouth every 6 (six) hours as needed for anxiety.     Marland Kitchen LORazepam (ATIVAN) 1 MG tablet Take 1 mg by mouth 2 (two) times daily.    . memantine (NAMENDA) 10 MG tablet Take 10 mg by mouth 2 (two) times daily.    . mirtazapine (REMERON) 7.5 MG tablet Take 3.25 mg by mouth at bedtime.    Marland Kitchen  Multiple Vitamins-Minerals (MULTIVITAMIN WITH MINERALS) tablet Take 1 tablet by mouth daily.      . nortriptyline (PAMELOR) 25 MG capsule Take 75 mg by mouth at bedtime.    Marland Kitchen omega-3 acid ethyl esters (LOVAZA) 1 g capsule Take 1 capsule (1 g total) by mouth daily. 30 capsule 12  . Omega-3 Fatty Acids (OMEGA 3 PO) Take 1 capsule by mouth at bedtime.    Marland Kitchen OVER THE COUNTER MEDICATION Take 1 tablet by mouth 2 (two) times daily. *Cocavia*    . PARoxetine (PAXIL) 20 MG tablet Take 20 mg by mouth at bedtime.     . Pyridoxine HCl (VITAMIN B-6) 500 MG tablet Take 500 mg by mouth 2 (two) times daily.    . ranitidine (ZANTAC) 150 MG tablet Take 1 tablet (150 mg total) by  mouth 2 (two) times daily. 60 tablet 3  . rosuvastatin (CRESTOR) 40 MG tablet Take 1 tablet (40 mg total) by mouth daily. 30 tablet 6  . sennosides-docusate sodium (SENOKOT-S) 8.6-50 MG tablet Take 1 tablet by mouth at bedtime.    . TOVIAZ 8 MG TB24 tablet TAKE 1 TABLET BY MOUTH DAILY 90 tablet 1  . vitamin B-12 (CYANOCOBALAMIN) 1000 MCG tablet Take 1,000 mcg by mouth at bedtime.    . nitroGLYCERIN (NITROSTAT) 0.4 MG SL tablet Place 1 tablet (0.4 mg total) under the tongue every 5 (five) minutes as needed for chest pain. (Patient not taking: Reported on 09/06/2017) 25 tablet 3   No current facility-administered medications on file prior to visit.     PAST MEDICAL HISTORY:   Past Medical History:  Diagnosis Date  . Anxiety   . Arthritis    "hips, spine" (05/16/2017)  . Bipolar II disorder ([place])   . Chronic bronchitis ([place])   . Chronic lower back pain   . Chronic right hip pain   . CKD (chronic kidney disease), stage II   . Esophagitis, erosive   . GAD (generalized anxiety disorder)   . GERD (gastroesophageal reflux disease)   . Headache    "weekly" (05/16/2017)  . Heart murmur, systolic   . History of adenomatous polyp of colon    08-04-2016  tubular adenoma  . History of electroconvulsive therapy    at [facility]--  started 04-15-2015 to 11-17-2016  total greater than 40 times  . History of hiatal hernia   . Hyperlipidemia   . Hypertension   . Hypothyroidism   . Internal carotid artery stenosis, bilateral    per last duplex 05-01-2014  bilateral ICA 40-59%  . Major depression, chronic    ECT treatments extensive and multiple started 07/ 2016  . Memory loss    "both short and long-term; needs frequent reminders to follow instrucitons" (05/16/2017)  . Migraines    "none in years" (05/16/2017)  . OSA (obstructive sleep apnea)    per study 06/ 2012 moderate OSA    . Osteoporosis   . Pulmonary nodule    monitored by pcp    PAST SURGICAL HISTORY:   Past Surgical History:  Procedure  Laterality Date  . APPENDECTOMY  1971  . AUGMENTATION MAMMAPLASTY Bilateral   . CARDIOVASCULAR STRESS TEST  01-30-2015  dr Aundra Dubin   Low risk nuclear study w/ no evidence ischemia or infarction/  normal LV funciton and wall motion , 76%  . COLONOSCOPY W/ BIOPSIES AND POLYPECTOMY  "multiple"  . COLONOSCOPY WITH ESOPHAGOGASTRODUODENOSCOPY (EGD)  last one 08-04-2016  . CORONARY ANGIOPLASTY WITH STENT PLACEMENT  05/16/2017   "LAD"  .  CORONARY STENT INTERVENTION N/A 05/16/2017   Procedure: CORONARY STENT INTERVENTION;  Surgeon: Burnell Blanks, MD;  Location: [facility];  Service: Cardiovascular;  Laterality: N/A;  . ESOPHAGOGASTRODUODENOSCOPY  02-26-04  . ESOPHAGOGASTRODUODENOSCOPY  "multiple"  . LEFT HEART CATH AND CORONARY ANGIOGRAPHY N/A 05/16/2017   Procedure: LEFT HEART CATH AND CORONARY ANGIOGRAPHY;  Surgeon: Larey Dresser, MD;  Location: [facility];  Service: Cardiovascular;  Laterality: N/A;  . OVARIAN CYST SURGERY  1970s   Laparotomy   . PORT-A-CATH PLACEMENT  05/31/2016   "@ [facility]; for ECT series"  . PORT-A-CATH REMOVAL N/A 03/11/2017   Procedure: REMOVAL PORT-A-CATH;  Surgeon: Jackolyn Confer, MD;  Location: [facility];  Service: General;  Laterality: N/A;  . TRANSTHORACIC ECHOCARDIOGRAM  09/05/2013  dr Aundra Dubin   mild LVH, ef 91-47%, grade 1 diastolic dysfunction/  very mild AV stenosis with mild AR/  trivial MR and PT/ mild to moderate LAE/ mild TR/ mild pulmonary hypertension with PA peak pressure 46mmHg  . TUBAL LIGATION    . VAGINAL HYSTERECTOMY     "partial"    SOCIAL HISTORY:   Social History   Socioeconomic History  . Marital status: Married    Spouse name: Dr. Lyla Son  . Number of children: 2  . Years of education: Not on file  . Highest education level: Not on file  Social Needs  . Financial resource strain: Not on file  . Food insecurity - worry: Not on file  . Food insecurity - inability: Not on file  . Transportation  needs - medical: Not on file  . Transportation needs - non-medical: Not on file  Occupational History  . Occupation: housewife    Employer: UNEMPLOYED  Tobacco Use  . Smoking status: Former Smoker    Packs/day: 2.00    Years: 15.00    Pack years: 30.00    Types: Cigarettes    Last attempt to quit: 01/13/1971    Years since quitting: 46.6  . Smokeless tobacco: Never Used  Substance and Sexual Activity  . Alcohol use: Yes    Alcohol/week: 0.0 oz    Comment: 05/16/2017 "nothing in years"  . Drug use: No  . Sexual activity: Not Currently    Comment: intercourse age 80, sexual partners less than 5  Other Topics Concern  . Not on file  Social History Narrative  . Not on file    FAMILY HISTORY:   Family Status  Relation Name Status  . Father  Deceased       CAD  . Mother  Deceased       COPD  . Brother  Alive       skin CA (? type)  . Child  Alive       2, healthy  . PGM  (Not Specified)  . Ethlyn Daniels  (Not Specified)  . Neg Hx  (Not Specified)    ROS:    A complete 10 system review of systems was obtained and was unremarkable apart from what is mentioned above.  PHYSICAL EXAMINATION:    VITALS:   Vitals:   09/06/17 0838  BP: (!) 148/62  Pulse: 60  SpO2: 97%  Weight: 107 lb (48.5 kg)  Height: 4' 11.5" (1.511 m)   Wt Readings from Last 3 Encounters:  09/06/17 107 lb (48.5 kg)  08/31/17 107 lb (48.5 kg)  08/08/17 109 lb 9.6 oz (49.7 kg)     GEN:  The patient appears younger than stated age  and is in NAD.  Affect flat.   HEENT:  Normocephalic, atraumatic.  The mucous membranes are moist. The superficial temporal arteries are without ropiness or tenderness. CV:  RRR Lungs:  CTAB Neck/HEME:  There are no carotid bruits bilaterally.  Neurological examination:  Orientation: The patient is alert and oriented x3.  However, her nurse provides most of the history. Cranial nerves: There is good facial symmetry.  Her affect is not nearly as flat as in the past.  Her  speech is fluent and clear.  Hearing is intact conversational tone. Sensation: Sensation is intact light touch throughout. Motor: Strength is 5/5 in the upper and lower externally's.   Movement examination: Tone: There is normal tone in the upper and lower extremities. Abnormal movements: There is mild postural tremor.  This does not increase with intention.  Archimedes spirals are not drawn well, but there really is no tremor associated with the spirals.  There are better than last year. Coordination:  There is no significant decremation with RAM's, including alternating supination and pronation of the forearm, hand opening and closing, finger taps, heel taps and toe taps. Gait and Station: The patient has no difficulty arising out of a deep-seated chair without the use of the hands.  She leans to the right and slightly drags the right leg.   LABS  Lab Results  Component Value Date   WBC 6.9 07/16/2017   HGB 10.0 (L) 07/16/2017   HCT 30.7 (L) 07/16/2017   MCV 94.2 07/16/2017   PLT 201 07/16/2017     Chemistry      Component Value Date/Time   NA 132 (L) 08/08/2017 1615   NA 136 04/20/2017 1151   K 4.0 08/08/2017 1615   CL 96 08/08/2017 1615   CO2 26 08/08/2017 1615   BUN 19 08/08/2017 1615   BUN 24 04/20/2017 1151   CREATININE 0.79 08/08/2017 1615   CREATININE 0.99 (H) 11/16/2016 1540      Component Value Date/Time   CALCIUM 10.3 08/08/2017 1615   ALKPHOS 46 08/08/2017 1615   AST 19 08/08/2017 1615   ALT 30 08/08/2017 1615   BILITOT 0.4 08/08/2017 1615   BILITOT <0.2 04/20/2017 1151     Lab Results  Component Value Date   LITHIUM 0.45 (L) 07/13/2017   NA 132 (L) 08/08/2017   BUN 19 08/08/2017   CREATININE 0.79 08/08/2017   TSH 2.68 09/02/2016   WBC 6.9 07/16/2017     ASSESSMENT/PLAN:  1.  Tremor.   - I reminded her again today about the fact that she has lithium-induced tremor.  I also reminded her that when she was off of the lithium in the past for short  period of time, her tremor was improved.  That does not mean, of course, that she does not need the lithium.  However, lithium-induced tremor is very resistant to treatments with other medications.  Lithium induced tremor also does not imply lithium toxicity.  Her tremor really is not that bad and is overall mild.  She would like to stay on primidone.  She is on primidone, 100 mg in the morning and 50 mg at night.  Risks, benefits, side effects and alternative therapies were discussed.  The opportunity to ask questions was given and they were answered to the best of my ability.  The patient expressed understanding and willingness to follow the outlined treatment protocols. 2.  Memory changes.  I suspect that this is likely from multiple ECT treatments.   -She has  no longer drinking alcohol.  I congratulated her again.  -pts nurse states that [name] sent referral to Dr. Si Raider but I don't see a referral for that.  I don't see a referral for that.  I am going to check on that.  -discussed alarmed pill box, especially for when they go away and don't have their caregivers present.  Her nurse states that they had that years ago and the patient and her husband actually broke into it with a letter opener.  Her nurse states that they are going to consider getting one again. 3.  Syncope  -This occurred in August, 2018, the day after her heart catheterization.  She is now off of antihypertensives.  She is seeing cardiology.  This was felt orthostatic in nature. 4.  Major depressive disorder  -While she will be heading back to [place] on Sunday, she will be returning every 2-3 weeks for ECT treatments.  -She is maintained on lithium.  5.  Explained to the patient that in order for me to refill her medications, I need to see her at least on a yearly basis.  Patient and her caregiver expressed understanding.  Much greater than 50% of this visit was spent in counseling and coordinating care.  Total face to face  time:  25 min.

## 2017-09-06 ENCOUNTER — Ambulatory Visit (INDEPENDENT_AMBULATORY_CARE_PROVIDER_SITE_OTHER): Payer: Medicare Other | Admitting: Neurology

## 2017-09-06 ENCOUNTER — Encounter: Payer: Self-pay | Admitting: Neurology

## 2017-09-06 VITALS — BP 148/62 | HR 60 | Ht 59.5 in | Wt 107.0 lb

## 2017-09-06 DIAGNOSIS — I951 Orthostatic hypotension: Secondary | ICD-10-CM | POA: Diagnosis not present

## 2017-09-06 DIAGNOSIS — I208 Other forms of angina pectoris: Secondary | ICD-10-CM

## 2017-09-06 DIAGNOSIS — G251 Drug-induced tremor: Secondary | ICD-10-CM

## 2017-09-06 DIAGNOSIS — F331 Major depressive disorder, recurrent, moderate: Secondary | ICD-10-CM | POA: Diagnosis not present

## 2017-09-06 DIAGNOSIS — F3341 Major depressive disorder, recurrent, in partial remission: Secondary | ICD-10-CM | POA: Diagnosis not present

## 2017-09-06 MED ORDER — PRIMIDONE 50 MG PO TABS: ORAL_TABLET | ORAL | 3 refills | Status: DC

## 2017-09-06 NOTE — Patient Instructions (Signed)
I will check on referral for Dr. Si Raider from [name]  Let me know if you would like to get alarmed pill box

## 2017-09-07 ENCOUNTER — Other Ambulatory Visit: Payer: Self-pay | Admitting: Gastroenterology

## 2017-09-08 ENCOUNTER — Other Ambulatory Visit: Payer: Self-pay

## 2017-09-08 ENCOUNTER — Encounter: Payer: Self-pay | Admitting: Gastroenterology

## 2017-09-08 NOTE — Telephone Encounter (Signed)
error 

## 2017-09-09 DIAGNOSIS — F332 Major depressive disorder, recurrent severe without psychotic features: Secondary | ICD-10-CM | POA: Diagnosis not present

## 2017-09-20 ENCOUNTER — Other Ambulatory Visit (HOSPITAL_COMMUNITY): Payer: Self-pay | Admitting: *Deleted

## 2017-09-20 MED ORDER — AMLODIPINE BESYLATE 5 MG PO TABS: 5.0000 mg | ORAL_TABLET | Freq: Every day | ORAL | 3 refills | Status: DC

## 2017-09-23 DIAGNOSIS — G4730 Sleep apnea, unspecified: Secondary | ICD-10-CM | POA: Diagnosis not present

## 2017-09-23 DIAGNOSIS — I2510 Atherosclerotic heart disease of native coronary artery without angina pectoris: Secondary | ICD-10-CM | POA: Diagnosis not present

## 2017-09-23 DIAGNOSIS — F332 Major depressive disorder, recurrent severe without psychotic features: Secondary | ICD-10-CM | POA: Diagnosis not present

## 2017-09-23 DIAGNOSIS — I10 Essential (primary) hypertension: Secondary | ICD-10-CM | POA: Diagnosis not present

## 2017-09-30 DIAGNOSIS — M6289 Other specified disorders of muscle: Secondary | ICD-10-CM | POA: Diagnosis not present

## 2017-09-30 DIAGNOSIS — K5901 Slow transit constipation: Secondary | ICD-10-CM | POA: Diagnosis not present

## 2017-10-02 DIAGNOSIS — K2210 Ulcer of esophagus without bleeding: Secondary | ICD-10-CM | POA: Diagnosis not present

## 2017-10-02 DIAGNOSIS — K56609 Unspecified intestinal obstruction, unspecified as to partial versus complete obstruction: Secondary | ICD-10-CM | POA: Diagnosis not present

## 2017-10-02 DIAGNOSIS — K2950 Unspecified chronic gastritis without bleeding: Secondary | ICD-10-CM | POA: Diagnosis not present

## 2017-10-02 DIAGNOSIS — K5190 Ulcerative colitis, unspecified, without complications: Secondary | ICD-10-CM | POA: Diagnosis not present

## 2017-10-02 DIAGNOSIS — K5289 Other specified noninfective gastroenteritis and colitis: Secondary | ICD-10-CM | POA: Diagnosis present

## 2017-10-02 DIAGNOSIS — K55049 Acute infarction of large intestine, extent unspecified: Secondary | ICD-10-CM | POA: Diagnosis not present

## 2017-10-02 DIAGNOSIS — F0390 Unspecified dementia without behavioral disturbance: Secondary | ICD-10-CM | POA: Diagnosis present

## 2017-10-02 DIAGNOSIS — R9431 Abnormal electrocardiogram [ECG] [EKG]: Secondary | ICD-10-CM | POA: Diagnosis not present

## 2017-10-02 DIAGNOSIS — D123 Benign neoplasm of transverse colon: Secondary | ICD-10-CM | POA: Diagnosis present

## 2017-10-02 DIAGNOSIS — I10 Essential (primary) hypertension: Secondary | ICD-10-CM | POA: Diagnosis present

## 2017-10-02 DIAGNOSIS — K5900 Constipation, unspecified: Secondary | ICD-10-CM | POA: Diagnosis not present

## 2017-10-02 DIAGNOSIS — K219 Gastro-esophageal reflux disease without esophagitis: Secondary | ICD-10-CM | POA: Diagnosis present

## 2017-10-02 DIAGNOSIS — E871 Hypo-osmolality and hyponatremia: Secondary | ICD-10-CM | POA: Diagnosis not present

## 2017-10-02 DIAGNOSIS — K633 Ulcer of intestine: Secondary | ICD-10-CM | POA: Diagnosis not present

## 2017-10-02 DIAGNOSIS — K626 Ulcer of anus and rectum: Secondary | ICD-10-CM | POA: Diagnosis not present

## 2017-10-02 DIAGNOSIS — K2970 Gastritis, unspecified, without bleeding: Secondary | ICD-10-CM | POA: Diagnosis not present

## 2017-10-02 DIAGNOSIS — K449 Diaphragmatic hernia without obstruction or gangrene: Secondary | ICD-10-CM | POA: Diagnosis present

## 2017-10-02 DIAGNOSIS — R0789 Other chest pain: Secondary | ICD-10-CM | POA: Diagnosis not present

## 2017-10-02 DIAGNOSIS — K922 Gastrointestinal hemorrhage, unspecified: Secondary | ICD-10-CM | POA: Diagnosis present

## 2017-10-02 DIAGNOSIS — R1084 Generalized abdominal pain: Secondary | ICD-10-CM | POA: Diagnosis not present

## 2017-10-02 DIAGNOSIS — D500 Iron deficiency anemia secondary to blood loss (chronic): Secondary | ICD-10-CM | POA: Diagnosis present

## 2017-10-02 DIAGNOSIS — K2960 Other gastritis without bleeding: Secondary | ICD-10-CM | POA: Diagnosis present

## 2017-10-02 DIAGNOSIS — E039 Hypothyroidism, unspecified: Secondary | ICD-10-CM | POA: Diagnosis not present

## 2017-10-02 DIAGNOSIS — K5909 Other constipation: Secondary | ICD-10-CM | POA: Diagnosis not present

## 2017-10-02 DIAGNOSIS — D72829 Elevated white blood cell count, unspecified: Secondary | ICD-10-CM | POA: Diagnosis not present

## 2017-10-02 DIAGNOSIS — D649 Anemia, unspecified: Secondary | ICD-10-CM | POA: Diagnosis not present

## 2017-10-02 DIAGNOSIS — R05 Cough: Secondary | ICD-10-CM | POA: Diagnosis not present

## 2017-10-02 DIAGNOSIS — K5641 Fecal impaction: Secondary | ICD-10-CM | POA: Diagnosis not present

## 2017-10-02 DIAGNOSIS — R112 Nausea with vomiting, unspecified: Secondary | ICD-10-CM | POA: Diagnosis not present

## 2017-10-02 DIAGNOSIS — K56699 Other intestinal obstruction unspecified as to partial versus complete obstruction: Secondary | ICD-10-CM | POA: Diagnosis not present

## 2017-10-05 ENCOUNTER — Other Ambulatory Visit: Payer: Self-pay | Admitting: Gastroenterology

## 2017-10-10 ENCOUNTER — Telehealth: Payer: Self-pay | Admitting: Gastroenterology

## 2017-10-10 ENCOUNTER — Ambulatory Visit (INDEPENDENT_AMBULATORY_CARE_PROVIDER_SITE_OTHER): Payer: Medicare Other | Admitting: Psychology

## 2017-10-10 ENCOUNTER — Other Ambulatory Visit: Payer: Self-pay | Admitting: Gastroenterology

## 2017-10-10 DIAGNOSIS — F03B0 Unspecified dementia, moderate, without behavioral disturbance, psychotic disturbance, mood disturbance, and anxiety: Secondary | ICD-10-CM

## 2017-10-10 DIAGNOSIS — F0390 Unspecified dementia without behavioral disturbance: Secondary | ICD-10-CM

## 2017-10-10 MED ORDER — LACTULOSE 10 GM/15ML PO SOLN: 10.0000 g | Freq: Every day | ORAL | 1 refills | Status: DC | PRN

## 2017-10-10 NOTE — Telephone Encounter (Signed)
Ok to send the Rx for Lactulose 10 -15 cc daily prn X 360cc bottle . Thanks

## 2017-10-10 NOTE — Telephone Encounter (Signed)
Dr Silverio Decamp I dont see lactulose on her med list  Can I send this

## 2017-10-10 NOTE — Progress Notes (Signed)
NEUROPSYCHOLOGICAL EVALUATION  Name: Meghan Oliver Date of Birth: 06-Apr-1941 Date of Evaluation: 10/10/2017  Date of Report: 10/12/2017  Reason for Referral:  Meghan Oliver is a 77 y.o. female who is referred for neuropsychological evaluation by Dr. Lynder Parents of [facility] due to concerns about memory decline in the context of severe depression and ongoing ECT. This patient is accompanied in the office by her husband, Dr. Lyla Son, and her nurse/caregiver, [name], who provide most of the history.  History of Presenting Problem:  Meghan Oliver has a long history of bipolar II disorder and recurrent major depressive episodes beginning in her 66s. During depressive episodes she becomes catatonic, unresponsive and seemingly cognitively impaired. Multiple psychiatric medications have been tried over the years but unfortunately none have been very effective. She has been on lithium consistently and remains on this. About 10-12 years ago she had a particularly severe episode of catatonic depression and was treated with ECT at [facility]. Unilateral ECT was initially tried but this was not effective, and she eventually underwent bilateral ECT with positive effects. Over a period of months, she reportedly improved significantly both with regard to mood and cognition. It is unclear when her next relapse was, but she did remain stable for an extended period of time. She had ECT treatment on and off over the years. She then experienced a severe relapse with catatonic depression about a year and a half ago. Between April 2018 and August 2018 she had 25 bilateral ECT treatments, per her nurse. These were "scattered and not consistent" treatments, per her nurse. Since August 2018, she has had regular and consistent bilateral ECT treatments at [facility]. Per her nurse, she completed a total of 94 bilateral ECT treatments in 2018, with her most recent treatment on 09/23/2017.  There were  no significant cognitive concerns or signs of serious cognitive decline prior to the patient's severe depressive episode about 10-12 years ago. After the initial course of ECT 10-12 years ago, cognition reportedly eventually returned 100% to baseline. Per her nurse and husband, after each subsequent course of ECT, she would have the typical amnestic symptoms associated with the procedure afterwards and then improve, but never to baseline. Each time, she has improved less. At this point, her nurse and husband feel she is at about 50% of her baseline functional level. She has been on memantine since August 2017.   Meghan Oliver's husband is convinced that ECT treatments are affecting her cognition permanently and are directly responsible for the cognitive impairment she seems to be experiencing. However, they have recognized they must continue ECT because within 6 weeks of not having an ECT treatment, she goes into catatonic depression and is completely non-functional.   She has no known family history of dementia, but her 68yo brother is showing some signs of memory decline. Family psychiatric history is significant for major depressive disorder in her brother, and bipolar I disorder in her father.  The patient has a prior history of alcohol abuse, but she has been sober for the last 7 years.  Current Functioning: Current cognitive symptoms include forgetfulness for things she has been told and events that have happened in even the past few minutes/hours (this occurs on a daily basis), confusion and disorientation (which has reportedly increased in the last 2 months), and more difficulty processing information. Speech is occasionally "slurred or garbled", especially when she is fatigued. She has withdrawn socially over the past 10 years. She does  still go out to dinner with her husband and their friends but usually interacts very little.  She continues to demonstrate signs of depression including lack of  motivation/initiation, hypersomnia, and reduced appetite with weight loss, but she denies dysphoria or sadness. She denies suicidal ideation or intention. (She also has no history of suicidal intention or attempts, although more recently she has commented that she would be fine with "not waking up".)  She denies any physical complaints during the interview. Her nurse and husband report she has issues with GERD and some discomfort when she swallows. She also has arthritis and back issues.  She is not experiencing any psychosis, and she has no history of hallucinations or delusions except one time in the context of lithium toxicity.  She reports she enjoys spending time with her husband and her caregiver, going for walks, and spending time with her dog, Meghan Oliver.  She is independent for all basic ADLs, including bathing, eating, toileting, walking, dressing and other self hygiene (hair, make-up). She does not perform any instrumental ADLs. She has not driven in 2 years. Her husband has taken over administration of her medications due to significant errors she was making (e.g., took 4 days of medication in one day). She has not managed the finances, appointments or cooking for 10-12 years since her significant depressive episode and initiation of ECT.   Medical History: Past Medical History:  Diagnosis Date  . Anxiety   . Arthritis    "hips, spine" (05/16/2017)  . Bipolar II disorder ([place])   . Chronic bronchitis ([place])   . Chronic lower back pain   . Chronic right hip pain   . CKD (chronic kidney disease), stage II   . Esophagitis, erosive   . GAD (generalized anxiety disorder)   . GERD (gastroesophageal reflux disease)   . Headache    "weekly" (05/16/2017)  . Heart murmur, systolic   . History of adenomatous polyp of colon    08-04-2016  tubular adenoma  . History of electroconvulsive therapy    at [place]--  started 04-15-2015 to 11-17-2016  total greater than 40 times  . History of hiatal  hernia   . Hyperlipidemia   . Hypertension   . Hypothyroidism   . Internal carotid artery stenosis, bilateral    per last duplex 05-01-2014  bilateral ICA 40-59%  . Major depression, chronic    ECT treatments extensive and multiple started 07/ 2016  . Memory loss    "both short and long-term; needs frequent reminders to follow instrucitons" (05/16/2017)  . Migraines    "none in years" (05/16/2017)  . OSA (obstructive sleep apnea)    per study 06/ 2012 moderate OSA    . Osteoporosis   . Pulmonary nodule    monitored by pcp    Current Medications:  Outpatient Encounter Medications as of 10/10/2017  Medication Sig  . acetaminophen (TYLENOL) 500 MG tablet Take 1,000 mg by mouth every 6 (six) hours as needed for mild pain.  Marland Kitchen amLODipine (NORVASC) 5 MG tablet Take 1 tablet (5 mg total) by mouth daily.  Marland Kitchen aspirin 81 MG tablet Take 81 mg by mouth at bedtime.   . Calcium Carbonate-Vitamin D (CALCIUM 600/VITAMIN D PO) Take 1 tablet by mouth 2 (two) times daily.   . cetirizine (ZYRTEC) 10 MG tablet Take 10 mg by mouth daily as needed for allergies.   . Cholecalciferol (VITAMIN D-3) 5000 units TABS Take 1 tablet by mouth daily with breakfast.  . clopidogrel (PLAVIX) 75 MG  tablet Take 1 tablet (75 mg total) by mouth daily with breakfast. (Patient taking differently: Take 75 mg daily by mouth. )  . irbesartan (AVAPRO) 150 MG tablet TAKE 1 TABLET BY MOUTH DAILY (Patient taking differently: Take 1 tablet by mouth prn ECT days only)  . L-Methylfolate-B12-B6-B2 (CEREFOLIN) 03-04-49-5 MG TABS Take 1-2 tablets by mouth 2 (two) times daily. Takes 2 tablets in the morning and 1 tablet at bedtime  . Lavender Oil 80 MG CAPS Take 160 mg by mouth at bedtime.  Marland Kitchen levothyroxine (SYNTHROID, LEVOTHROID) 75 MCG tablet TAKE 1 TABLET(75 MCG) BY MOUTH DAILY  . linaclotide (LINZESS) 290 MCG CAPS capsule Take 290 mcg daily before breakfast by mouth.  . lithium carbonate 150 MG capsule 300 mg in the morning, 150 mg at night    . LORazepam (ATIVAN) 0.5 MG tablet Take 0.5 mg by mouth every 6 (six) hours as needed for anxiety.   Marland Kitchen LORazepam (ATIVAN) 1 MG tablet Take 1 mg by mouth 2 (two) times daily.  . memantine (NAMENDA) 10 MG tablet Take 10 mg by mouth 2 (two) times daily.  . mirtazapine (REMERON) 7.5 MG tablet Take 3.25 mg by mouth at bedtime.  . Multiple Vitamins-Minerals (MULTIVITAMIN WITH MINERALS) tablet Take 1 tablet by mouth daily.    . nitroGLYCERIN (NITROSTAT) 0.4 MG SL tablet Place 1 tablet (0.4 mg total) under the tongue every 5 (five) minutes as needed for chest pain. (Patient not taking: Reported on 09/06/2017)  . nortriptyline (PAMELOR) 25 MG capsule Take 75 mg by mouth at bedtime.  Marland Kitchen omega-3 acid ethyl esters (LOVAZA) 1 g capsule Take 1 capsule (1 g total) by mouth daily.  . Omega-3 Fatty Acids (OMEGA 3 PO) Take 1 capsule by mouth at bedtime.  Marland Kitchen OVER THE COUNTER MEDICATION Take 1 tablet by mouth 2 (two) times daily. *Cocavia*  . PARoxetine (PAXIL) 20 MG tablet Take 20 mg by mouth at bedtime.   . primidone (MYSOLINE) 50 MG tablet 2 tabs AM and 1 tab QHS  . Pyridoxine HCl (VITAMIN B-6) 500 MG tablet Take 500 mg by mouth 2 (two) times daily.  . ranitidine (ZANTAC) 150 MG tablet TAKE 1 TABLET(150 MG) BY MOUTH TWICE DAILY  . rosuvastatin (CRESTOR) 40 MG tablet Take 1 tablet (40 mg total) by mouth daily.  . sennosides-docusate sodium (SENOKOT-S) 8.6-50 MG tablet Take 1 tablet by mouth at bedtime.  . TOVIAZ 8 MG TB24 tablet TAKE 1 TABLET BY MOUTH DAILY  . vitamin B-12 (CYANOCOBALAMIN) 1000 MCG tablet Take 1,000 mcg by mouth at bedtime.   No facility-administered encounter medications on file as of 10/10/2017.     Social History: Born/Raised: [place], [place] Education: Dietitian from [employer] Marital history: Married with two adult daughters and five grandchildren Alcohol: Prior history of alcohol abuse, sober for the last 7 years Tobacco: Former cigarette smoker, quit >40 years ago   Medical  Necessity for Evaluation: There was medical necessity to proceed with neuropsychological assessment as the results will be used to aid in differential diagnosis and clinical decision-making and to inform specific treatment recommendations. Per the patient's family/caregivers and medical records reviewed, there has been a significant change in cognitive functioning and a reasonable suspicion of dementia (etiology unclear at this point).  Following the clinical interview/neurobehavioral status exam, neuropsychological tests were administered to the patient by the neuropsychologist.  Clinical Decision Making: In considering the patient's current level of functioning, level of presumed impairment, nature of symptoms, emotional and behavioral responses during the interview and  testing session, and observed level of motivation/effort, a battery of tests was selected and then altered as necessary.    Behavioral Observations:   Appearance: Very neatly and appropriately dressed and groomed Gait: Ambulated independently, no gross abnormalities observed Speech: Fluent; somewhat sparse; normal rate, rhythm and volume. Mild to moderate word finding difficulty. Thought process: Appears linear, somewhat distractible, no signs of psychosis Affect: Ranged from blunted to full, smiles brightly, anxious Interpersonal: Very pleasant, appropriate, did require encouragement to continue the testing session   Tests Administered: . Dementia Rating Scale-Second Edition (DRS-2) . [employer] Verbal Learning Test - 2nd Edition (CVLT-2) Short Form . Trail Making Test A and B . Clock drawing test  Test Results: Note: Standardized scores are presented only for use by appropriately trained professionals and to allow for any future test-retest comparison. These scores should not be interpreted without consideration of all the information that is contained in the rest of the report. The most recent standardization samples from  the test publisher or other sources were used whenever possible to derive standard scores; scores were corrected for age, gender, ethnicity and education when available.   Test Scores:  Test Name Raw Score Standardized Score Descriptor  DRS-2     Attention 35 ss= 10 Average  Initiation/Perseveration 26 ss= 3 Impaired  Construction 6 ss= 10 Average  Conceptualization 15 ss= 3 Impaired  Memory 13 ss= 2 Severely impaired  Total Score  105 ss= 2 Severely impaired  Total Score - Education Adjusted (AEMSS)  ss= 0 Severely impaired  CVLT-II Scores     Trial 1 1 Z= -3 Severely impaired  Trial 4 2 Z= -3 Severely impaired  Trials 1-4 total 8 T= 13 Severely impaired  SD Free Recall 0 Z= -2.5 Severely impaired  LD Free Recall 0 Z= -2 Impaired  LD Cued Recall 2 Z= -2.5 Severely impaired  Recognition Discriminability (6/9 hits, 7 false positives)  Z= -2 Impaired  Trail Making Test A 0 errors 88 sec T= 27 Impaired  Trail Making Test B Unable    Clock Drawing   Impaired      Description of Test Results:  Premorbid verbal intellectual abilities are estimated to have been within at least the average range based on educational history.   On measure of general cognitive status which is also used to assist diagnosis and staging of dementia (DRS-2), the patient's overall performance fell within the severely impaired range and was consistent with moderate stage dementia. On the DRS-2, basic attention and basic visual-spatial construction were intact, but there were severe deficits in memory and executive functioning. With regard to performance on the memory scale of the DRS-2, she demonstrated diminished orientation to time and place (disoriented to date, year, current President and name of our practice/building), as well as impaired delayed recall of new verbal information. With regard to executive functioning on DRS-2, she demonstrated severely impaired verbal abstract reasoning, reduced verbal fluency,  and some mild perseveration on a graphomotor task.    On a more robust measure of episodic auditory memory function (CVLT-II-SF), she demonstrated severely impaired encoding/consolidation. She was unable to learn and store any of the verbal/auditory information and was unable to differentiate target items from foils on a yes/no recognition task.  Trails A and B were also administered. She was able to complete Trails A without any errors but it took her significantly increased time to finish the task (severely impaired), suggesting significantly reduced psychomotor processing speed. Unfortunately, she was unable to complete even  a sample task for Trails B, suggesting impaired mental flexibility/set-shifting (another aspect of executive functioning). On a clock drawing task, she was able to draw the circular clock face and accurately place numbers 1-12, but she did not know how to indicate the time "ten past eleven".    Clinical Impressions: Moderate dementia, unspecified. Formal standardized testing reveals significant cognitive dysfunction, with prominent deficits in temporal orientation, encoding/consolidation of new information, and executive function. Additionally, there is evidence that her cognitive deficits are interfering with her ability to perform complex tasks (eg driving, managing medications and appointments). As such, diagnostic criteria for a dementia syndrome are met.   She is currently undergoing ECT (last treatment was over 2 weeks ago), and therefore it is impossible to determine with any level of certainty what cognitive effects are due to ECT versus alternative etiology, and what cognitive deficits may be permanent versus transient. There is clear evidence in the research literature that many individuals undergoing ECT experience memory deficits and confusion in the weeks after each treatment. What is much more controversial in the literature is whether and to what degree these  cognitive symptoms persist and are due to permanent brain damage. Furthermore, there is no research that I am aware of at the time of this writing that investigates the permanency of cognitive effects in older individuals who have had >50 ECT treatments in old age. As such, I am really only able to speak about the patient's current cognitive status, and am unable to comment definitively on prognosis with regard to cognitive functioning. (Complicating things even further is that severe depression causes cognitive symptoms of its own and is a risk factor for dementia in later life.)  What I can say is that the time course of cognitive symptoms (as indicated by the patient's family/caregivers) strongly suggests that the patient's cognitive impairment is directly related to ECT. Specifically, they report initial full cognitive recovery after first round of ECT treatment, with reducing degrees of recovery after each subsequent round of treatment. However, even if this is the case, I cannot definitively rule out a superimposed degenerative dementia, and I cannot comment on the permanency of cognitive symptoms if she were to stop ECT for a considerable amount of time.   I think it is important to note that I do not believe the patient's dementia diagnosis should affect clinical decision making and treatment planning with regard to her bipolar depression. All other treatment options aside from ECT have been tried and have yielded very limited benefit. ECT has clearly provided significant benefit in terms of mood and quality of life. If ECT were to be stopped, it is very likely she would decompensate and experience another bout of catatonic depression, which is even more disabling than her current level of dementia.   I explained to the patient's husband and nurse all the limitations of interpretation and determining prognosis based on the current examination. I explained that there has not been enough clinical studies  to determine whether ECT causes permanent or long term cognitive effects after treatment is stopped. I explained that there are no research studies of which I am aware that investigate cognitive deficits in individuals who are over age 35 and who have undergone anywhere near the number of treatments this patient has. I explained that ECT is likely contributing to her current state of cognitive dysfunction but I cannot say to what degree and if it will worsen over time. I explained that my recommendation is to continue ECT as  it appears to be the best option for treating her depression and allowing her to function at her highest possible level and have highest possible QOL.    Thank you for your referral of Mrs. Kloehn. Please feel free to contact me if you have any questions or concerns regarding this report.    Total time spent on this patient's case: 60 minutes of face-to-face time spent in neurobehavioral status exam with patient and family/caregiver [phone] unit); 45 minutes of face-to-face time spent administering neuropsychological tests to the patient, 20 minutes spent scoring test data [phone] unit, [id number] unit); 40 minutes spent consulting with referral source about referral question/patient background and reviewing medical records, 10 minutes spent in clinical decision making about test battery, 180 minutes spent integrating patient data, interpreting standardized test results and clinical data and preparing report, 30 minutes spent providing interactive feedback with review of results to patient's family/caregiver [phone] and [id number] units).

## 2017-10-10 NOTE — Telephone Encounter (Signed)
Lactulose sent in for patient

## 2017-10-12 ENCOUNTER — Encounter: Payer: Self-pay | Admitting: Psychology

## 2017-10-12 DIAGNOSIS — F332 Major depressive disorder, recurrent severe without psychotic features: Secondary | ICD-10-CM | POA: Diagnosis not present

## 2017-10-12 DIAGNOSIS — I10 Essential (primary) hypertension: Secondary | ICD-10-CM | POA: Diagnosis not present

## 2017-10-12 DIAGNOSIS — G4730 Sleep apnea, unspecified: Secondary | ICD-10-CM | POA: Diagnosis not present

## 2017-10-12 DIAGNOSIS — I2510 Atherosclerotic heart disease of native coronary artery without angina pectoris: Secondary | ICD-10-CM | POA: Diagnosis not present

## 2017-10-13 NOTE — Telephone Encounter (Signed)
Talked with Meghan Oliver regarding pt and she is in [place] for six months. Meghan Oliver asked that we recall pt when she returns home. I will also send this note to [place] for her ok.

## 2017-10-19 ENCOUNTER — Ambulatory Visit: Payer: Medicare Other | Admitting: Neurology

## 2017-10-31 DIAGNOSIS — S8002XA Contusion of left knee, initial encounter: Secondary | ICD-10-CM | POA: Diagnosis not present

## 2017-10-31 DIAGNOSIS — M25562 Pain in left knee: Secondary | ICD-10-CM | POA: Diagnosis not present

## 2017-10-31 DIAGNOSIS — M1712 Unilateral primary osteoarthritis, left knee: Secondary | ICD-10-CM | POA: Diagnosis not present

## 2017-11-01 DIAGNOSIS — F332 Major depressive disorder, recurrent severe without psychotic features: Secondary | ICD-10-CM | POA: Diagnosis not present

## 2017-11-07 ENCOUNTER — Other Ambulatory Visit: Payer: Self-pay | Admitting: Gastroenterology

## 2017-11-08 DIAGNOSIS — F3341 Major depressive disorder, recurrent, in partial remission: Secondary | ICD-10-CM | POA: Diagnosis not present

## 2017-11-09 DIAGNOSIS — F332 Major depressive disorder, recurrent severe without psychotic features: Secondary | ICD-10-CM | POA: Diagnosis not present

## 2017-11-09 DIAGNOSIS — I10 Essential (primary) hypertension: Secondary | ICD-10-CM | POA: Diagnosis not present

## 2017-11-09 DIAGNOSIS — F329 Major depressive disorder, single episode, unspecified: Secondary | ICD-10-CM | POA: Diagnosis not present

## 2017-11-16 NOTE — Telephone Encounter (Addendum)
Please follow up with this patient in May 2019 for Reclast, routed this message to Meghan Oliver> Regarding follow up in May 2019

## 2017-11-24 ENCOUNTER — Other Ambulatory Visit: Payer: Self-pay | Admitting: Internal Medicine

## 2017-12-02 DIAGNOSIS — Z9289 Personal history of other medical treatment: Secondary | ICD-10-CM

## 2017-12-02 HISTORY — DX: Personal history of other medical treatment: Z92.89

## 2017-12-05 ENCOUNTER — Other Ambulatory Visit (HOSPITAL_COMMUNITY): Payer: Self-pay | Admitting: *Deleted

## 2017-12-05 ENCOUNTER — Other Ambulatory Visit: Payer: Self-pay | Admitting: Gastroenterology

## 2017-12-05 MED ORDER — ROSUVASTATIN CALCIUM 40 MG PO TABS: 40.0000 mg | ORAL_TABLET | Freq: Every day | ORAL | 6 refills | Status: DC

## 2017-12-19 DIAGNOSIS — I2510 Atherosclerotic heart disease of native coronary artery without angina pectoris: Secondary | ICD-10-CM | POA: Diagnosis not present

## 2017-12-19 DIAGNOSIS — F332 Major depressive disorder, recurrent severe without psychotic features: Secondary | ICD-10-CM | POA: Diagnosis not present

## 2017-12-19 DIAGNOSIS — I10 Essential (primary) hypertension: Secondary | ICD-10-CM | POA: Diagnosis not present

## 2017-12-19 DIAGNOSIS — F310 Bipolar disorder, current episode hypomanic: Secondary | ICD-10-CM | POA: Diagnosis not present

## 2017-12-20 NOTE — Telephone Encounter (Signed)
Reached out to patient and per DPR spoke to Meghan Oliver to inquire if the patient was going to see Dr Radford Pax for her sleep therapy or Dr Brett Fairy and Meghan Oliver confirmed the patient will see Dr Asencion Partridge Dohmeier for her sleep therapy.

## 2017-12-21 ENCOUNTER — Ambulatory Visit: Payer: Medicare Other | Admitting: Neurology

## 2017-12-22 ENCOUNTER — Encounter: Payer: Self-pay | Admitting: Physician Assistant

## 2017-12-22 ENCOUNTER — Ambulatory Visit (INDEPENDENT_AMBULATORY_CARE_PROVIDER_SITE_OTHER): Payer: Medicare Other | Admitting: Physician Assistant

## 2017-12-22 VITALS — BP 118/60 | HR 64 | Ht 58.27 in | Wt 101.4 lb

## 2017-12-22 DIAGNOSIS — K5901 Slow transit constipation: Secondary | ICD-10-CM

## 2017-12-22 DIAGNOSIS — K219 Gastro-esophageal reflux disease without esophagitis: Secondary | ICD-10-CM

## 2017-12-22 MED ORDER — OMEPRAZOLE 40 MG PO CPDR: DELAYED_RELEASE_CAPSULE | ORAL | 11 refills | Status: DC

## 2017-12-22 NOTE — Patient Instructions (Addendum)
Normal BMI (Body Mass Index- based on height and weight) is between 23 and 30. Your BMI today is Body mass index is 20.99 kg/m. Meghan Oliver Please consider follow up  regarding your BMI with your Primary Care Provider.  We have sent the following medications to your pharmacy for you to pick up at your convenience:   Continue Ducolax 1-2 daily  Follow up with dr Nyoka Cowden as needed

## 2017-12-22 NOTE — Progress Notes (Signed)
Subjective:    Patient ID: Meghan Oliver, female    DOB: [birth year], 77 y.o.   MRN: [id number]  HPI Meghan Oliver is a pleasant 77 year old white female, known to Dr. Silverio Decamp, wife of Dr. Alyce Pagan, who comes back in today for follow-up.  They have returned to [facility] from [place] in order for patient to undergo an ECT treatment and are heading back to [place] later this week. Patient has history of hypertension, coronary artery disease, status post stent, she is maintained on chronic Plavix.  Also with obstructive sleep apnea, GERD, hypothyroidism and history of bipolar disorder with severe catatonic depression.  She has undergone numerous ECT treatments.  She also has developed a moderate dementia. She was last seen here in November 2018 at which time she was having abdominal pain and significant constipation.  Based on labs were done and unremarkable.  She had CT of the abdomen and pelvis done which showed a large stool burden in the colon and mild diverticulosis of the left colon with wall thickening in the distal sigmoid consistent with mild diverticulitis.  Patient felt better after purge of her bowel and she was not treated with antibiotics. She also had barium swallow done in October 2018 with finding of mild dysmotility.  Gastric emptying scan in October 2018-showed normal gastric emptying except at the fourth hour with slight delay. Patient has been on omeprazole 20 mg p.o. every morning, most recently has been using Dulcolax 1 or 2 daily with very good results and She did not respond well to Linzess even at high-dose.  They also use lactulose on a as needed basis but she has not been requiring that recently. Patient's husband relates that she had a severe episode of obstipation earlier this winter which required hospitalization for disimpaction in [place] and she also underwent colonoscopy with finding of stork oral ulcerations.  He brought a copy of this report with him. She also went  underwent EGD, which by report showed some white flecks in the esophagus which were biopsied to rule out Candida and an irregular Z line which was biopsied, mild antral gastritis and a medium sized hiatal hernia. I do not have copies of the path reports.  They do not plan to return there for care. Patient has been having intermittent complaints of chest pain which is being interpreted by her husband as reflux.  Patient is unable to give me any details about this chest pain.  Her husband says she has frequent nighttime reflux as well often wakes up coughing and sometimes will get up at night and actually vomit.  No complaints of dysphagia or odynophagia no current complaints of abdominal pain.  She is able to tell me that she is having regular bowel movements and feels the Dulcolax works well. She is unable to tell me any specifics about the chest pain as far as quality, associated weakness shortness of breath etc.  She does get some improvement with as needed Mylanta.  They do plan to have cardiology follow-up.  Review of Systems Pertinent positive and negative review of systems were noted in the above HPI section.  All other review of systems was otherwise negative.  Outpatient Encounter Medications as of 12/22/2017  Medication Sig  . acetaminophen (TYLENOL) 500 MG tablet Take 1,000 mg by mouth every 6 (six) hours as needed for mild pain.  Marland Kitchen amLODipine (NORVASC) 5 MG tablet Take 1 tablet (5 mg total) by mouth daily.  Marland Kitchen aspirin 81 MG tablet Take 81  mg by mouth at bedtime.   . Calcium Carbonate-Vitamin D (CALCIUM 600/VITAMIN D PO) Take 1 tablet by mouth 2 (two) times daily.   . cetirizine (ZYRTEC) 10 MG tablet Take 10 mg by mouth daily as needed for allergies.   . Cholecalciferol (VITAMIN D-3) 5000 units TABS Take 1 tablet by mouth daily with breakfast.  . clopidogrel (PLAVIX) 75 MG tablet Take 1 tablet (75 mg total) by mouth daily with breakfast. (Patient taking differently: Take 75 mg daily by mouth.  )  . irbesartan (AVAPRO) 150 MG tablet TAKE 1 TABLET BY MOUTH DAILY (Patient taking differently: Take 1 tablet by mouth prn ECT days only)  . L-Methylfolate-B12-B6-B2 (CEREFOLIN) 03-04-49-5 MG TABS Take 1-2 tablets by mouth 2 (two) times daily. Takes 2 tablets in the morning and 1 tablet at bedtime  . lactulose (CHRONULAC) 10 GM/15ML solution TAKE 15 ML BY MOUTH DAILY AS NEEDED FOR MILD CONSTIPATION  . Lavender Oil 80 MG CAPS Take 160 mg by mouth at bedtime.  Marland Kitchen levothyroxine (SYNTHROID, LEVOTHROID) 75 MCG tablet TAKE 1 TABLET(75 MCG) BY MOUTH DAILY  . linaclotide (LINZESS) 290 MCG CAPS capsule Take 290 mcg daily before breakfast by mouth.  . lithium carbonate 150 MG capsule 300 mg in the morning, 150 mg at night  . LORazepam (ATIVAN) 0.5 MG tablet Take 0.5 mg by mouth every 6 (six) hours as needed for anxiety.   Marland Kitchen LORazepam (ATIVAN) 1 MG tablet Take 1 mg by mouth 2 (two) times daily.  . memantine (NAMENDA) 10 MG tablet Take 10 mg by mouth 2 (two) times daily.  . mirtazapine (REMERON) 7.5 MG tablet Take 3.25 mg by mouth at bedtime.  . Multiple Vitamins-Minerals (MULTIVITAMIN WITH MINERALS) tablet Take 1 tablet by mouth daily.    . nortriptyline (PAMELOR) 25 MG capsule Take 75 mg by mouth at bedtime.  Marland Kitchen omega-3 acid ethyl esters (LOVAZA) 1 g capsule Take 1 capsule (1 g total) by mouth daily.  . Omega-3 Fatty Acids (OMEGA 3 PO) Take 1 capsule by mouth at bedtime.  Marland Kitchen OVER THE COUNTER MEDICATION Take 1 tablet by mouth 2 (two) times daily. *Cocavia*  . PARoxetine (PAXIL) 20 MG tablet Take 20 mg by mouth at bedtime.   . primidone (MYSOLINE) 50 MG tablet 2 tabs AM and 1 tab QHS  . Pyridoxine HCl (VITAMIN B-6) 500 MG tablet Take 500 mg by mouth 2 (two) times daily.  . ranitidine (ZANTAC) 150 MG tablet TAKE 1 TABLET(150 MG) BY MOUTH TWICE DAILY  . rosuvastatin (CRESTOR) 40 MG tablet Take 1 tablet (40 mg total) by mouth daily.  . sennosides-docusate sodium (SENOKOT-S) 8.6-50 MG tablet Take 1 tablet by mouth  at bedtime.  . TOVIAZ 8 MG TB24 tablet TAKE 1 TABLET BY MOUTH DAILY  . vitamin B-12 (CYANOCOBALAMIN) 1000 MCG tablet Take 1,000 mcg by mouth at bedtime.  . nitroGLYCERIN (NITROSTAT) 0.4 MG SL tablet Place 1 tablet (0.4 mg total) under the tongue every 5 (five) minutes as needed for chest pain. (Patient not taking: Reported on 09/06/2017)  . omeprazole (PRILOSEC) 40 MG capsule Take one tablet before dinner  . [DISCONTINUED] levothyroxine (SYNTHROID, LEVOTHROID) 75 MCG tablet TAKE 1 TABLET(75 MCG) BY MOUTH DAILY   No facility-administered encounter medications on file as of 12/22/2017.    Allergies  Allergen Reactions  . Propranolol Other (See Comments)    Low blood pressure  . Brexpiprazole Other (See Comments)    Aphasia and catatonia   Patient Active Problem List   Diagnosis Date  Noted  . Bradycardia 07/16/2017  . Unstable angina ([place])   . Hypotension 04/20/2017  . CAD (coronary artery disease) 01/05/2016  . Coronary artery calcification seen on CAT scan 06/25/2012  . Lung nodule 06/25/2012  . Hypothyroidism 03/21/2012  . Chronic cough 08/12/2011  . OSA (obstructive sleep apnea) 04/05/2011  . Essential hypertension 11/06/2010  . EDEMA 11/06/2010  . Chest pain 11/06/2010  . Hyperlipidemia 08/22/2007  . Bipolar disorder ([place]) 08/22/2007  . GERD 08/22/2007  . LOW BACK PAIN 08/22/2007  . OSTEOPOROSIS 08/22/2007  . INSOMNIA 08/22/2007  . SYSTOLIC MURMUR 11/94/1740   Social History   Socioeconomic History  . Marital status: Married    Spouse name: Dr. Lyla Son  . Number of children: 2  . Years of education: Not on file  . Highest education level: Not on file  Occupational History  . Occupation: housewife    Employer: UNEMPLOYED  Social Needs  . Financial resource strain: Not on file  . Food insecurity:    Worry: Not on file    Inability: Not on file  . Transportation needs:    Medical: Not on file    Non-medical: Not on file  Tobacco Use  . Smoking status: Former  Smoker    Packs/day: 2.00    Years: 15.00    Pack years: 30.00    Types: Cigarettes    Last attempt to quit: 01/13/1971    Years since quitting: 46.9  . Smokeless tobacco: Never Used  Substance and Sexual Activity  . Alcohol use: Yes    Alcohol/week: 0.0 oz    Comment: 05/16/2017 "nothing in years"  . Drug use: No  . Sexual activity: Not Currently    Comment: intercourse age 60, sexual partners less than 5  Lifestyle  . Physical activity:    Days per week: Not on file    Minutes per session: Not on file  . Stress: Not on file  Relationships  . Social connections:    Talks on phone: Not on file    Gets together: Not on file    Attends religious service: Not on file    Active member of club or organization: Not on file    Attends meetings of clubs or organizations: Not on file    Relationship status: Not on file  . Intimate partner violence:    Fear of current or ex partner: Not on file    Emotionally abused: Not on file    Physically abused: Not on file    Forced sexual activity: Not on file  Other Topics Concern  . Not on file  Social History Narrative  . Not on file    Meghan Oliver's family history includes Breast cancer in her paternal aunt and paternal grandmother; Heart attack (age of onset: 40) in her father; Heart disease in her father; Hypertension in her father.      Objective:    Vitals:   12/22/17 1104  BP: 118/60  Pulse: 64    Physical Exam; well-developed elderly white female, no acute distress, appears younger than stated age.  Defers to her husband to answer most questions.  Blood pressure 118/60 pulse 64, height 4 foot 10, weight 101, BMI 20.9.  HEENT; nontraumatic normocephalic EOMI PERRLA sclera anicteric, Cardiovascular ;regular rate and rhythm with S1-S2 no murmur rub or gallop, Pulmonary; clear bilaterally, Abdomen ;soft, nontender bowel sounds are active no palpable mass or hepatosplenomegaly, Rectal ;exam not done, Extremities ;no clubbing  cyanosis or edema skin warm and dry,  Neuro psych ;alert and cooperative, smiling but unable to answer any question in detail.       Assessment & Plan:   #70 77 year old white female with chronic GERD, intermittent complaints of nonspecific chest pain, and nocturnal reflux with coughing and sour brash, cannot rule out intermittent angina #2 chronic constipation #3 history of fecal impaction and stercoral ulceration December 2018-hospitalized in [facility] [place]-colonoscopy was done with finding of stercoral ulcerations #4 mild esophageal dysmotility #5 mildly delayed gastric emptying at 4 hours #6 coronary artery disease status post stent #7 chronic antiplatelet therapy-with Plavix #8 hypertension #9 bipolar disorder with severe depression-undergoing regular ECT treatments # 10 dementia  Plan; Strict  antireflux regimen was reviewed, including elevation of the head of the bed at least 45 degrees.  We discussed getting a Bed Cleda Mccreedy / Pillow Will increase omeprazole to 40 mg p.o. daily before meals dinner-prescription sent with several refills She will continue Dulcolax 1-2 on a daily basis, add lactulose as needed Dr. Rachelle Hora plans cardiology follow-up on return to [facility].     Glori Machnik Genia Harold PA-C 12/22/2017   Cc: [name], MD

## 2017-12-26 NOTE — Progress Notes (Signed)
Reviewed and agree with documentation and assessment and plan. K. Veena Jenisa Monty , MD   

## 2018-01-14 DIAGNOSIS — F419 Anxiety disorder, unspecified: Secondary | ICD-10-CM | POA: Diagnosis present

## 2018-01-14 DIAGNOSIS — K219 Gastro-esophageal reflux disease without esophagitis: Secondary | ICD-10-CM | POA: Diagnosis present

## 2018-01-14 DIAGNOSIS — R2241 Localized swelling, mass and lump, right lower limb: Secondary | ICD-10-CM | POA: Diagnosis not present

## 2018-01-14 DIAGNOSIS — Z87891 Personal history of nicotine dependence: Secondary | ICD-10-CM | POA: Diagnosis not present

## 2018-01-14 DIAGNOSIS — E871 Hypo-osmolality and hyponatremia: Secondary | ICD-10-CM | POA: Diagnosis present

## 2018-01-14 DIAGNOSIS — Z955 Presence of coronary angioplasty implant and graft: Secondary | ICD-10-CM | POA: Diagnosis not present

## 2018-01-14 DIAGNOSIS — S8011XA Contusion of right lower leg, initial encounter: Secondary | ICD-10-CM | POA: Diagnosis present

## 2018-01-14 DIAGNOSIS — F0390 Unspecified dementia without behavioral disturbance: Secondary | ICD-10-CM | POA: Diagnosis present

## 2018-01-14 DIAGNOSIS — D500 Iron deficiency anemia secondary to blood loss (chronic): Secondary | ICD-10-CM | POA: Diagnosis not present

## 2018-01-14 DIAGNOSIS — I10 Essential (primary) hypertension: Secondary | ICD-10-CM | POA: Diagnosis not present

## 2018-01-14 DIAGNOSIS — Z7902 Long term (current) use of antithrombotics/antiplatelets: Secondary | ICD-10-CM | POA: Diagnosis not present

## 2018-01-14 DIAGNOSIS — G4700 Insomnia, unspecified: Secondary | ICD-10-CM | POA: Diagnosis not present

## 2018-01-14 DIAGNOSIS — I119 Hypertensive heart disease without heart failure: Secondary | ICD-10-CM | POA: Diagnosis present

## 2018-01-14 DIAGNOSIS — M79661 Pain in right lower leg: Secondary | ICD-10-CM | POA: Diagnosis not present

## 2018-01-14 DIAGNOSIS — Z7901 Long term (current) use of anticoagulants: Secondary | ICD-10-CM | POA: Diagnosis not present

## 2018-01-14 DIAGNOSIS — E039 Hypothyroidism, unspecified: Secondary | ICD-10-CM | POA: Diagnosis present

## 2018-01-14 DIAGNOSIS — E785 Hyperlipidemia, unspecified: Secondary | ICD-10-CM | POA: Diagnosis present

## 2018-01-14 DIAGNOSIS — I2510 Atherosclerotic heart disease of native coronary artery without angina pectoris: Secondary | ICD-10-CM | POA: Diagnosis present

## 2018-01-14 DIAGNOSIS — F320 Major depressive disorder, single episode, mild: Secondary | ICD-10-CM | POA: Diagnosis not present

## 2018-01-14 DIAGNOSIS — M7981 Nontraumatic hematoma of soft tissue: Secondary | ICD-10-CM | POA: Diagnosis not present

## 2018-01-14 DIAGNOSIS — R600 Localized edema: Secondary | ICD-10-CM | POA: Diagnosis not present

## 2018-01-14 DIAGNOSIS — M7121 Synovial cyst of popliteal space [Baker], right knee: Secondary | ICD-10-CM | POA: Diagnosis not present

## 2018-01-14 DIAGNOSIS — R32 Unspecified urinary incontinence: Secondary | ICD-10-CM | POA: Diagnosis present

## 2018-01-14 DIAGNOSIS — D62 Acute posthemorrhagic anemia: Secondary | ICD-10-CM | POA: Diagnosis present

## 2018-01-14 DIAGNOSIS — Z888 Allergy status to other drugs, medicaments and biological substances status: Secondary | ICD-10-CM | POA: Diagnosis not present

## 2018-01-14 DIAGNOSIS — Z79890 Hormone replacement therapy (postmenopausal): Secondary | ICD-10-CM | POA: Diagnosis not present

## 2018-01-15 IMAGING — CT CT ABD-PELV W/ CM
2 of 5 series · 16 of 46 positions shown, 18 images · IV contrast (ISOVUE 300)
Comparison: 02/11/2004

CLINICAL DATA: Lower abdominal pain for several months. Irritable
bowel syndrome with constipation.

EXAM:
CT ABDOMEN AND PELVIS WITH CONTRAST
TECHNIQUE: Multidetector CT imaging of the abdomen and pelvis was performed
using the standard protocol following bolus administration of
intravenous contrast.
CONTRAST:  100mL [id number] IOPAMIDOL ([id number]) INJECTION 61%

[Series 2: abd/pel w · axial · 0.63mm/px · z∈[-368,-8]mm · 13 of 80 slices shown, 15 images]
[im 4/80  soft-tissue]
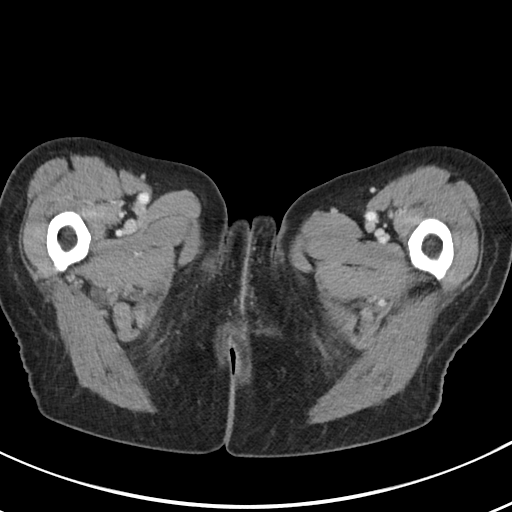
[im 4/80  bone]
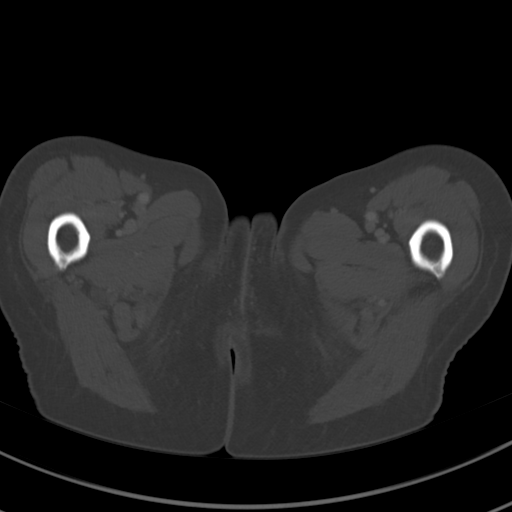
[im 12/80  soft-tissue]
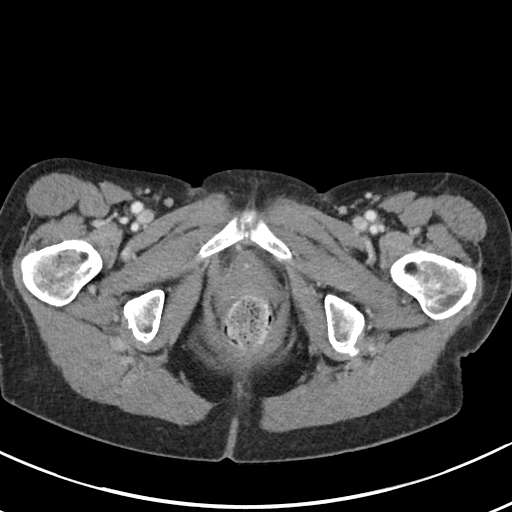
[im 16/80  soft-tissue]
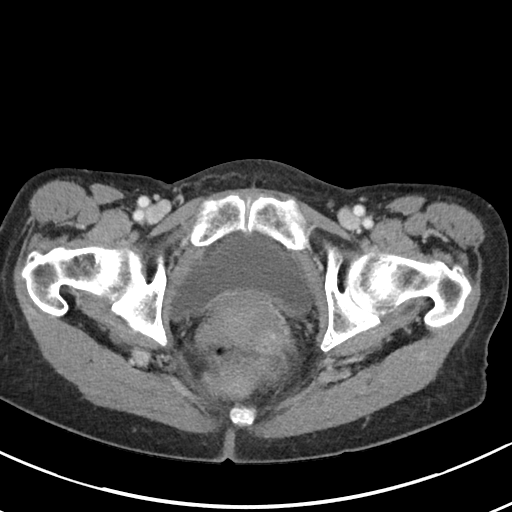
[im 24/80  soft-tissue]
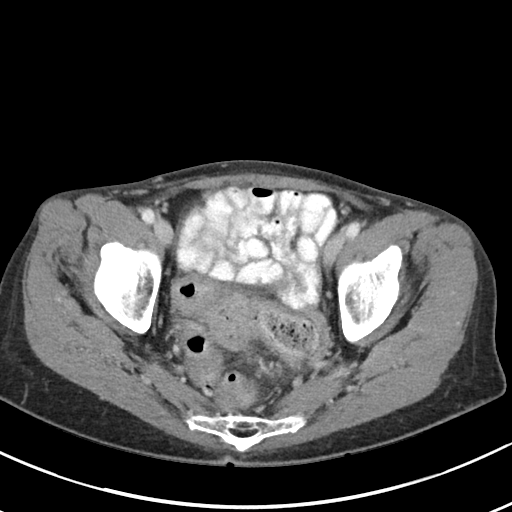
[im 28/80  soft-tissue]
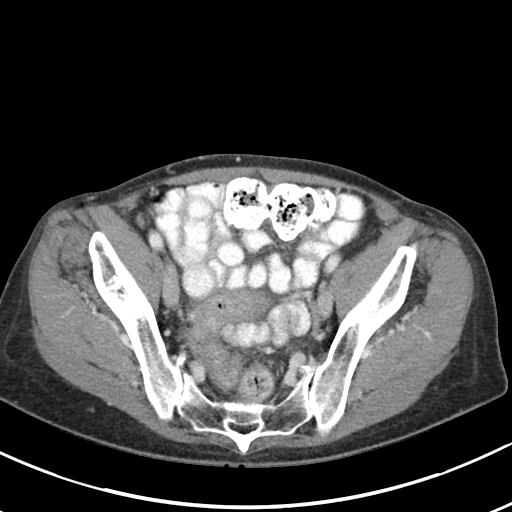
[im 36/80  soft-tissue]
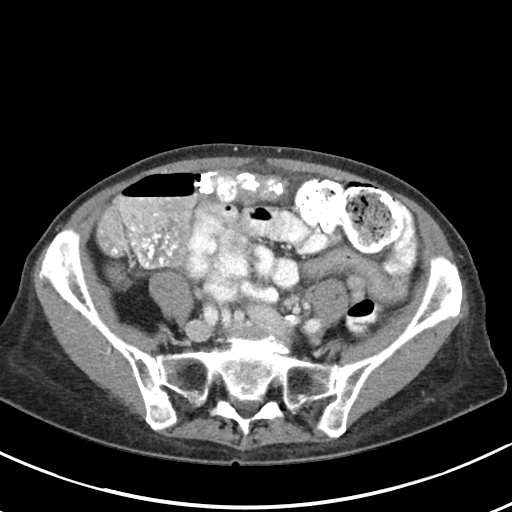
[im 40/80  soft-tissue]
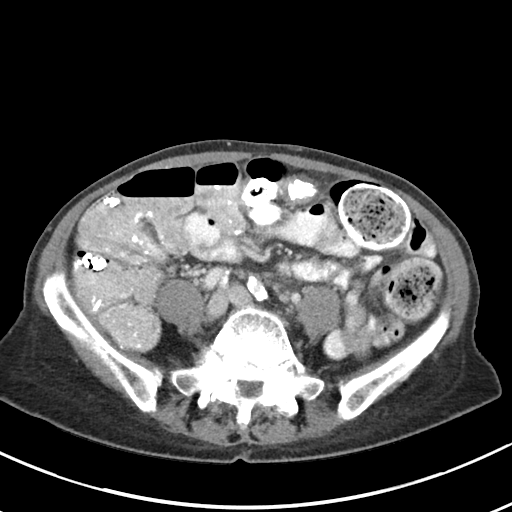
[im 44/80  soft-tissue]
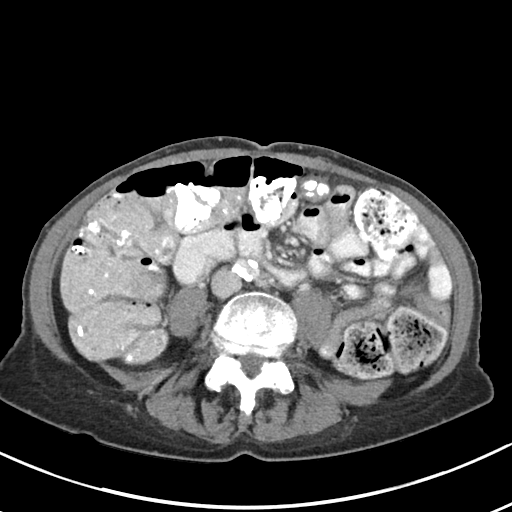
[im 52/80  soft-tissue]
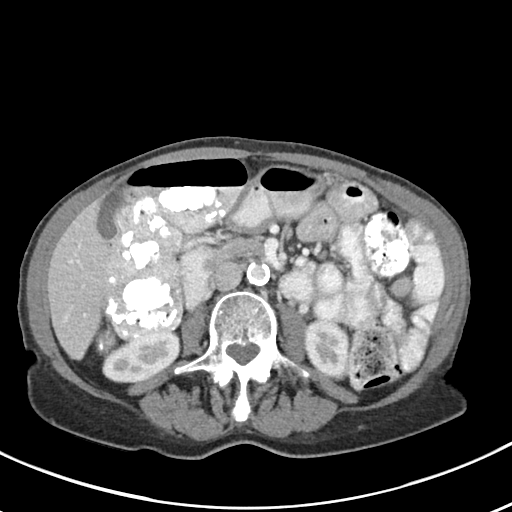
[im 52/80  bone]
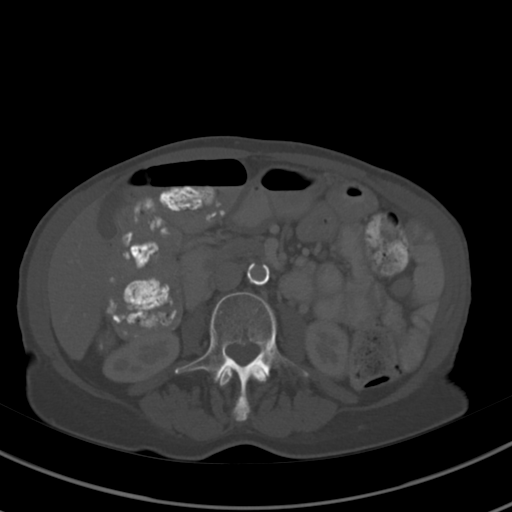
[im 56/80  soft-tissue]
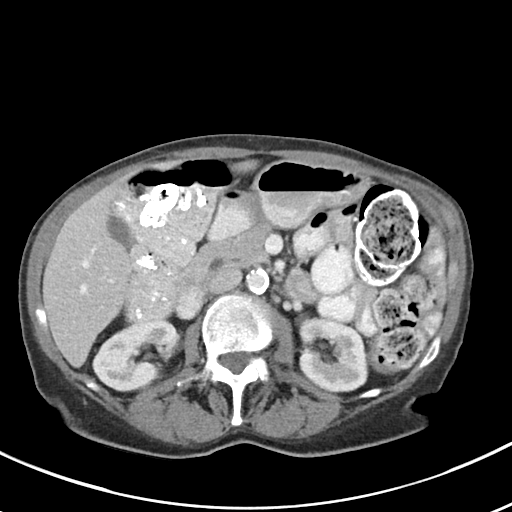
[im 64/80  soft-tissue]
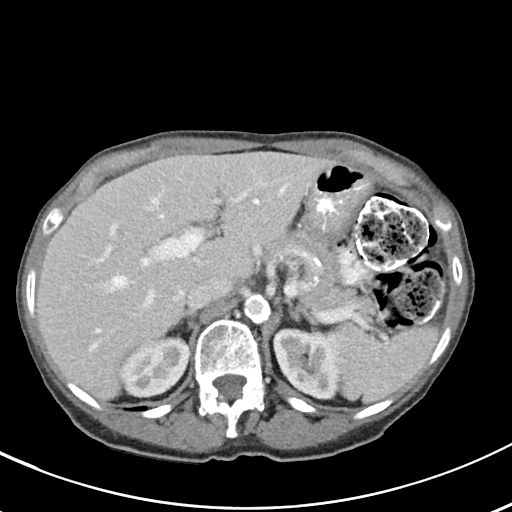
[im 68/80  soft-tissue]
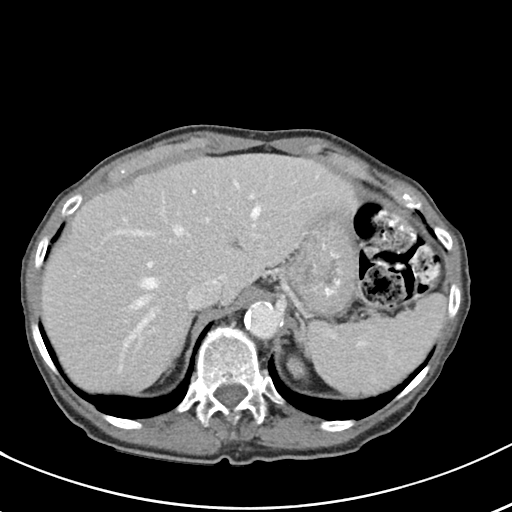
[im 76/80  soft-tissue]
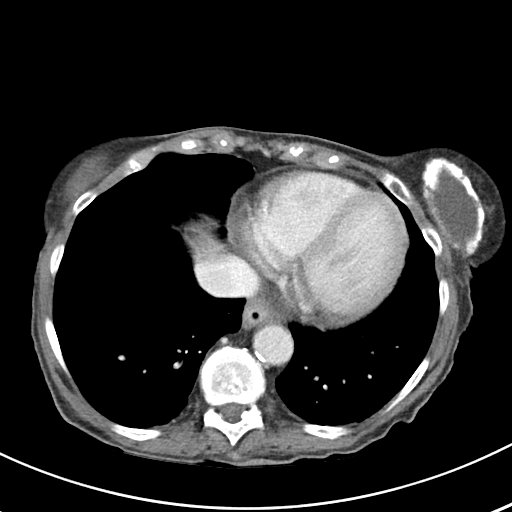

[Series 6: abd/pel w st · coronal · 0.63mm/px · 3 of 70 slices shown]
[im 24/70  soft-tissue]
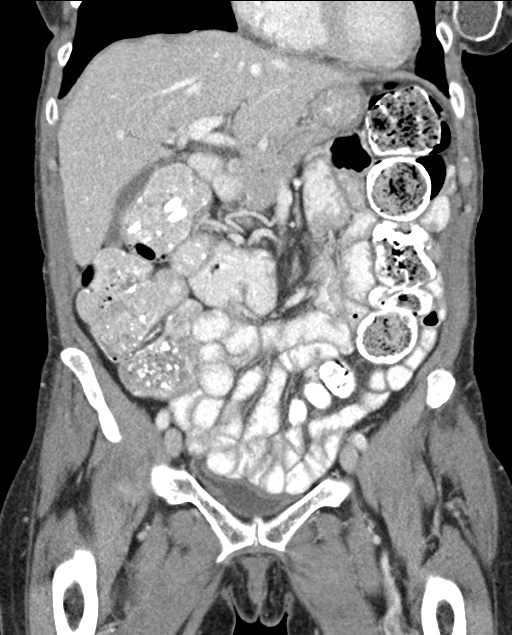
[im 31/70  soft-tissue]
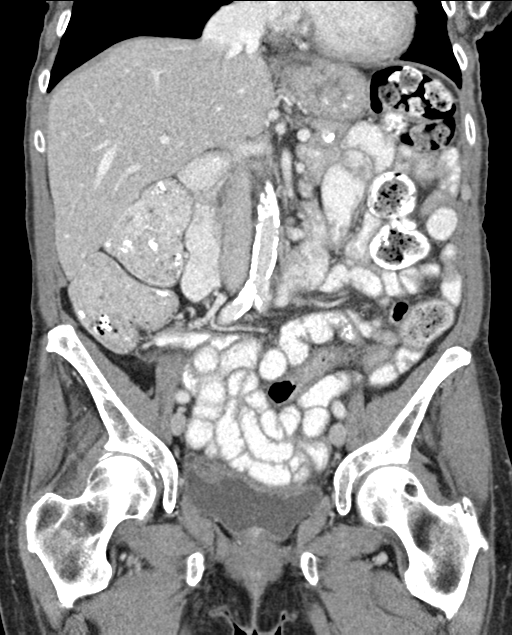
[im 39/70  soft-tissue]
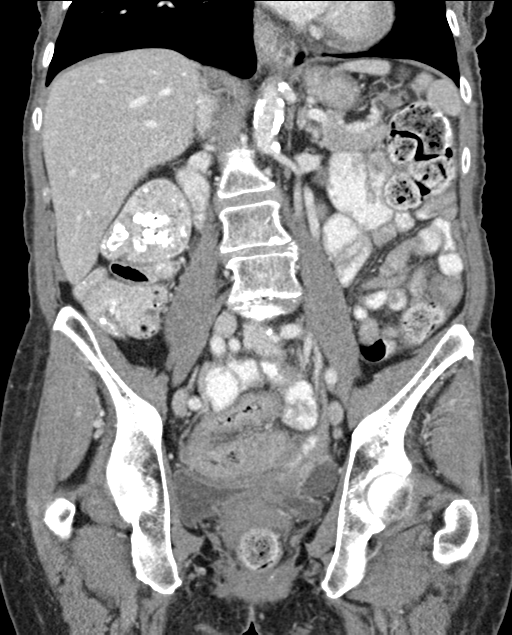

[16 of 46 positions shown; findings below may reference images not displayed]

FINDINGS: Lower Chest: No acute findings.

Hepatobiliary: No hepatic masses identified. Gallbladder is
unremarkable.

Pancreas:  No mass or inflammatory changes.

Spleen: Within normal limits in size and appearance.

Adrenals/Urinary Tract: No masses identified. 2.6 cm simple
appearing left renal cyst. No evidence of hydronephrosis.
Unremarkable unopacified urinary bladder.

Stomach/Bowel: Mild diverticulosis and colonic wall thickening is
seen involving the distal sigmoid colon, consistent with
diverticulitis. Large colonic stool burden noted. No evidence
abscess or free fluid.

Vascular/Lymphatic: No pathologically enlarged lymph nodes. No
abdominal aortic aneurysm. Aortic atherosclerosis.

Reproductive: Normal appearance of uterus. 2.5 cm benign-appearing
cyst noted in left adnexa.

Other:  None.

Musculoskeletal: No suspicious bone lesions identified. Severe
degenerative lumbar spondylosis with grade 2 anterolisthesis at
L5-S1.
IMPRESSION: Mild diverticulitis of distal sigmoid colon. No evidence of abscess
or other complication.

Large stool burden noted; recommend clinical correlation for
possible constipation.

## 2018-01-16 ENCOUNTER — Other Ambulatory Visit (HOSPITAL_COMMUNITY): Payer: Self-pay | Admitting: *Deleted

## 2018-01-16 MED ORDER — AMLODIPINE BESYLATE 5 MG PO TABS: 5.0000 mg | ORAL_TABLET | Freq: Every day | ORAL | 3 refills | Status: DC

## 2018-01-19 DIAGNOSIS — E039 Hypothyroidism, unspecified: Secondary | ICD-10-CM | POA: Diagnosis not present

## 2018-01-19 DIAGNOSIS — Z4789 Encounter for other orthopedic aftercare: Secondary | ICD-10-CM | POA: Diagnosis not present

## 2018-01-19 DIAGNOSIS — I10 Essential (primary) hypertension: Secondary | ICD-10-CM | POA: Diagnosis not present

## 2018-01-19 DIAGNOSIS — Z7982 Long term (current) use of aspirin: Secondary | ICD-10-CM | POA: Diagnosis not present

## 2018-01-19 DIAGNOSIS — R32 Unspecified urinary incontinence: Secondary | ICD-10-CM | POA: Diagnosis not present

## 2018-01-19 DIAGNOSIS — F411 Generalized anxiety disorder: Secondary | ICD-10-CM | POA: Diagnosis not present

## 2018-01-19 DIAGNOSIS — E785 Hyperlipidemia, unspecified: Secondary | ICD-10-CM | POA: Diagnosis not present

## 2018-01-19 DIAGNOSIS — G4700 Insomnia, unspecified: Secondary | ICD-10-CM | POA: Diagnosis not present

## 2018-01-19 DIAGNOSIS — Z79891 Long term (current) use of opiate analgesic: Secondary | ICD-10-CM | POA: Diagnosis not present

## 2018-01-19 DIAGNOSIS — F319 Bipolar disorder, unspecified: Secondary | ICD-10-CM | POA: Diagnosis not present

## 2018-01-19 DIAGNOSIS — K219 Gastro-esophageal reflux disease without esophagitis: Secondary | ICD-10-CM | POA: Diagnosis not present

## 2018-01-19 DIAGNOSIS — I2510 Atherosclerotic heart disease of native coronary artery without angina pectoris: Secondary | ICD-10-CM | POA: Diagnosis not present

## 2018-01-19 NOTE — Telephone Encounter (Addendum)
Will call the 1st of May pt out of town  Annual Exam: Up coming 03/06/18 [place]  Recast instructions filled out?  Called pt to verify insurance coverage / inform pt Reclast is in Process ?YES  Labs must be in 30 days window Serum Creatinine DATE? Same day as annual  Serum Calcium DATE? Same day as annual   PA needed? NO PER MEDICARE reference # [id number]  Cost for Pt? $0  pt has secondary insurance which covers the 20% co-insurance (info from [employer])  Order form filled out and faxed  w/MD sig?  Infusion will be done at ?  Pt aware?   Instructions mailed to pt?

## 2018-01-20 DIAGNOSIS — E039 Hypothyroidism, unspecified: Secondary | ICD-10-CM | POA: Diagnosis not present

## 2018-01-20 DIAGNOSIS — I2510 Atherosclerotic heart disease of native coronary artery without angina pectoris: Secondary | ICD-10-CM | POA: Diagnosis not present

## 2018-01-20 DIAGNOSIS — I10 Essential (primary) hypertension: Secondary | ICD-10-CM | POA: Diagnosis not present

## 2018-01-20 DIAGNOSIS — Z4789 Encounter for other orthopedic aftercare: Secondary | ICD-10-CM | POA: Diagnosis not present

## 2018-01-20 DIAGNOSIS — F319 Bipolar disorder, unspecified: Secondary | ICD-10-CM | POA: Diagnosis not present

## 2018-01-20 DIAGNOSIS — F411 Generalized anxiety disorder: Secondary | ICD-10-CM | POA: Diagnosis not present

## 2018-01-21 DIAGNOSIS — F319 Bipolar disorder, unspecified: Secondary | ICD-10-CM | POA: Diagnosis not present

## 2018-01-21 DIAGNOSIS — I10 Essential (primary) hypertension: Secondary | ICD-10-CM | POA: Diagnosis not present

## 2018-01-21 DIAGNOSIS — I2510 Atherosclerotic heart disease of native coronary artery without angina pectoris: Secondary | ICD-10-CM | POA: Diagnosis not present

## 2018-01-21 DIAGNOSIS — F411 Generalized anxiety disorder: Secondary | ICD-10-CM | POA: Diagnosis not present

## 2018-01-21 DIAGNOSIS — E039 Hypothyroidism, unspecified: Secondary | ICD-10-CM | POA: Diagnosis not present

## 2018-01-21 DIAGNOSIS — Z4789 Encounter for other orthopedic aftercare: Secondary | ICD-10-CM | POA: Diagnosis not present

## 2018-01-23 DIAGNOSIS — F319 Bipolar disorder, unspecified: Secondary | ICD-10-CM | POA: Diagnosis not present

## 2018-01-23 DIAGNOSIS — E039 Hypothyroidism, unspecified: Secondary | ICD-10-CM | POA: Diagnosis not present

## 2018-01-23 DIAGNOSIS — I10 Essential (primary) hypertension: Secondary | ICD-10-CM | POA: Diagnosis not present

## 2018-01-23 DIAGNOSIS — Z4789 Encounter for other orthopedic aftercare: Secondary | ICD-10-CM | POA: Diagnosis not present

## 2018-01-23 DIAGNOSIS — F411 Generalized anxiety disorder: Secondary | ICD-10-CM | POA: Diagnosis not present

## 2018-01-23 DIAGNOSIS — I2510 Atherosclerotic heart disease of native coronary artery without angina pectoris: Secondary | ICD-10-CM | POA: Diagnosis not present

## 2018-01-24 DIAGNOSIS — E039 Hypothyroidism, unspecified: Secondary | ICD-10-CM | POA: Diagnosis not present

## 2018-01-24 DIAGNOSIS — F411 Generalized anxiety disorder: Secondary | ICD-10-CM | POA: Diagnosis not present

## 2018-01-24 DIAGNOSIS — Z4789 Encounter for other orthopedic aftercare: Secondary | ICD-10-CM | POA: Diagnosis not present

## 2018-01-24 DIAGNOSIS — F319 Bipolar disorder, unspecified: Secondary | ICD-10-CM | POA: Diagnosis not present

## 2018-01-24 DIAGNOSIS — I10 Essential (primary) hypertension: Secondary | ICD-10-CM | POA: Diagnosis not present

## 2018-01-24 DIAGNOSIS — I2510 Atherosclerotic heart disease of native coronary artery without angina pectoris: Secondary | ICD-10-CM | POA: Diagnosis not present

## 2018-01-25 DIAGNOSIS — Z4789 Encounter for other orthopedic aftercare: Secondary | ICD-10-CM | POA: Diagnosis not present

## 2018-01-25 DIAGNOSIS — F411 Generalized anxiety disorder: Secondary | ICD-10-CM | POA: Diagnosis not present

## 2018-01-25 DIAGNOSIS — E039 Hypothyroidism, unspecified: Secondary | ICD-10-CM | POA: Diagnosis not present

## 2018-01-25 DIAGNOSIS — F319 Bipolar disorder, unspecified: Secondary | ICD-10-CM | POA: Diagnosis not present

## 2018-01-25 DIAGNOSIS — I2510 Atherosclerotic heart disease of native coronary artery without angina pectoris: Secondary | ICD-10-CM | POA: Diagnosis not present

## 2018-01-25 DIAGNOSIS — I10 Essential (primary) hypertension: Secondary | ICD-10-CM | POA: Diagnosis not present

## 2018-02-01 NOTE — Addendum Note (Signed)
Addended by: Lorine Bears on: 02/01/2018 03:01 PM   Modules accepted: Orders

## 2018-02-02 DIAGNOSIS — M4316 Spondylolisthesis, lumbar region: Secondary | ICD-10-CM | POA: Diagnosis not present

## 2018-02-03 DIAGNOSIS — F332 Major depressive disorder, recurrent severe without psychotic features: Secondary | ICD-10-CM | POA: Diagnosis not present

## 2018-02-03 DIAGNOSIS — Z87891 Personal history of nicotine dependence: Secondary | ICD-10-CM | POA: Diagnosis not present

## 2018-02-07 ENCOUNTER — Ambulatory Visit: Payer: BLUE CROSS/BLUE SHIELD | Admitting: Internal Medicine

## 2018-02-08 DIAGNOSIS — R269 Unspecified abnormalities of gait and mobility: Secondary | ICD-10-CM | POA: Diagnosis not present

## 2018-02-08 DIAGNOSIS — M6281 Muscle weakness (generalized): Secondary | ICD-10-CM | POA: Diagnosis not present

## 2018-02-08 DIAGNOSIS — M79661 Pain in right lower leg: Secondary | ICD-10-CM | POA: Diagnosis not present

## 2018-02-09 DIAGNOSIS — F3341 Major depressive disorder, recurrent, in partial remission: Secondary | ICD-10-CM | POA: Diagnosis not present

## 2018-02-13 DIAGNOSIS — M6281 Muscle weakness (generalized): Secondary | ICD-10-CM | POA: Diagnosis not present

## 2018-02-13 DIAGNOSIS — M79661 Pain in right lower leg: Secondary | ICD-10-CM | POA: Diagnosis not present

## 2018-02-13 DIAGNOSIS — R269 Unspecified abnormalities of gait and mobility: Secondary | ICD-10-CM | POA: Diagnosis not present

## 2018-02-15 ENCOUNTER — Ambulatory Visit: Payer: Medicare Other | Admitting: Internal Medicine

## 2018-02-15 DIAGNOSIS — M79661 Pain in right lower leg: Secondary | ICD-10-CM | POA: Diagnosis not present

## 2018-02-15 DIAGNOSIS — M6281 Muscle weakness (generalized): Secondary | ICD-10-CM | POA: Diagnosis not present

## 2018-02-15 DIAGNOSIS — R269 Unspecified abnormalities of gait and mobility: Secondary | ICD-10-CM | POA: Diagnosis not present

## 2018-02-20 DIAGNOSIS — R269 Unspecified abnormalities of gait and mobility: Secondary | ICD-10-CM | POA: Diagnosis not present

## 2018-02-20 DIAGNOSIS — M79661 Pain in right lower leg: Secondary | ICD-10-CM | POA: Diagnosis not present

## 2018-02-20 DIAGNOSIS — M6281 Muscle weakness (generalized): Secondary | ICD-10-CM | POA: Diagnosis not present

## 2018-02-21 DIAGNOSIS — H353131 Nonexudative age-related macular degeneration, bilateral, early dry stage: Secondary | ICD-10-CM | POA: Diagnosis not present

## 2018-02-23 ENCOUNTER — Other Ambulatory Visit: Payer: Medicare Other | Admitting: Internal Medicine

## 2018-02-23 DIAGNOSIS — S93504A Unspecified sprain of right lesser toe(s), initial encounter: Secondary | ICD-10-CM | POA: Diagnosis not present

## 2018-02-25 ENCOUNTER — Other Ambulatory Visit: Payer: Self-pay | Admitting: Internal Medicine

## 2018-03-02 DIAGNOSIS — M6281 Muscle weakness (generalized): Secondary | ICD-10-CM | POA: Diagnosis not present

## 2018-03-02 DIAGNOSIS — M79661 Pain in right lower leg: Secondary | ICD-10-CM | POA: Diagnosis not present

## 2018-03-02 DIAGNOSIS — R269 Unspecified abnormalities of gait and mobility: Secondary | ICD-10-CM | POA: Diagnosis not present

## 2018-03-06 ENCOUNTER — Encounter: Payer: Medicare Other | Admitting: Women's Health

## 2018-03-06 DIAGNOSIS — R269 Unspecified abnormalities of gait and mobility: Secondary | ICD-10-CM | POA: Diagnosis not present

## 2018-03-06 DIAGNOSIS — M6281 Muscle weakness (generalized): Secondary | ICD-10-CM | POA: Diagnosis not present

## 2018-03-06 DIAGNOSIS — M79661 Pain in right lower leg: Secondary | ICD-10-CM | POA: Diagnosis not present

## 2018-03-09 NOTE — Telephone Encounter (Signed)
Pt rescheduled annual exam to 05/02/18

## 2018-03-13 DIAGNOSIS — M6281 Muscle weakness (generalized): Secondary | ICD-10-CM | POA: Diagnosis not present

## 2018-03-13 DIAGNOSIS — M79661 Pain in right lower leg: Secondary | ICD-10-CM | POA: Diagnosis not present

## 2018-03-13 DIAGNOSIS — R269 Unspecified abnormalities of gait and mobility: Secondary | ICD-10-CM | POA: Diagnosis not present

## 2018-03-15 ENCOUNTER — Telehealth (HOSPITAL_COMMUNITY): Payer: Self-pay | Admitting: *Deleted

## 2018-03-15 DIAGNOSIS — Z5181 Encounter for therapeutic drug level monitoring: Secondary | ICD-10-CM | POA: Diagnosis not present

## 2018-03-15 DIAGNOSIS — F3170 Bipolar disorder, currently in remission, most recent episode unspecified: Secondary | ICD-10-CM | POA: Diagnosis not present

## 2018-03-15 DIAGNOSIS — F3175 Bipolar disorder, in partial remission, most recent episode depressed: Secondary | ICD-10-CM | POA: Diagnosis not present

## 2018-03-15 NOTE — Telephone Encounter (Signed)
Patients RN [name] left a VM on triage line stating patient has experienced intermittent low heart rate (40s-50s) and low bp reading. Patient had one near syncopal episode. She wants patient to be seen in office this week or early next week.   Message forwarded to Dr.McLean and his RN Katrina.

## 2018-03-15 NOTE — Telephone Encounter (Signed)
Have her get a nursing visit tomorrow morning for ECG and we will decide what to do, may need holter depending on what we find.  If she develops worsening symptoms such as lightheadedness, needs to go to ER.

## 2018-03-15 NOTE — Telephone Encounter (Signed)
RN visit schedule for 10am 03/16/18

## 2018-03-16 ENCOUNTER — Other Ambulatory Visit: Payer: Self-pay

## 2018-03-16 ENCOUNTER — Encounter (HOSPITAL_COMMUNITY): Payer: Self-pay

## 2018-03-16 ENCOUNTER — Ambulatory Visit (HOSPITAL_BASED_OUTPATIENT_CLINIC_OR_DEPARTMENT_OTHER): Payer: Medicare Other

## 2018-03-16 ENCOUNTER — Ambulatory Visit (HOSPITAL_COMMUNITY)
Admission: RE | Admit: 2018-03-16 | Discharge: 2018-03-16 | Disposition: A | Discharge status: Home / Self Care | Payer: Medicare Other | Source: Ambulatory Visit | Attending: Internal Medicine | Admitting: Internal Medicine

## 2018-03-16 ENCOUNTER — Telehealth: Payer: Self-pay

## 2018-03-16 VITALS — BP 102/82 | HR 47 | Wt 104.0 lb

## 2018-03-16 DIAGNOSIS — R918 Other nonspecific abnormal finding of lung field: Secondary | ICD-10-CM | POA: Insufficient documentation

## 2018-03-16 DIAGNOSIS — G4733 Obstructive sleep apnea (adult) (pediatric): Secondary | ICD-10-CM | POA: Diagnosis not present

## 2018-03-16 DIAGNOSIS — I6523 Occlusion and stenosis of bilateral carotid arteries: Secondary | ICD-10-CM | POA: Diagnosis not present

## 2018-03-16 DIAGNOSIS — I200 Unstable angina: Secondary | ICD-10-CM

## 2018-03-16 DIAGNOSIS — R42 Dizziness and giddiness: Secondary | ICD-10-CM | POA: Insufficient documentation

## 2018-03-16 DIAGNOSIS — I351 Nonrheumatic aortic (valve) insufficiency: Secondary | ICD-10-CM | POA: Diagnosis not present

## 2018-03-16 DIAGNOSIS — Z79899 Other long term (current) drug therapy: Secondary | ICD-10-CM | POA: Insufficient documentation

## 2018-03-16 DIAGNOSIS — F0390 Unspecified dementia without behavioral disturbance: Secondary | ICD-10-CM | POA: Insufficient documentation

## 2018-03-16 DIAGNOSIS — K219 Gastro-esophageal reflux disease without esophagitis: Secondary | ICD-10-CM | POA: Insufficient documentation

## 2018-03-16 DIAGNOSIS — I2510 Atherosclerotic heart disease of native coronary artery without angina pectoris: Secondary | ICD-10-CM

## 2018-03-16 DIAGNOSIS — Z8249 Family history of ischemic heart disease and other diseases of the circulatory system: Secondary | ICD-10-CM | POA: Insufficient documentation

## 2018-03-16 DIAGNOSIS — Z87891 Personal history of nicotine dependence: Secondary | ICD-10-CM | POA: Insufficient documentation

## 2018-03-16 DIAGNOSIS — I129 Hypertensive chronic kidney disease with stage 1 through stage 4 chronic kidney disease, or unspecified chronic kidney disease: Secondary | ICD-10-CM | POA: Insufficient documentation

## 2018-03-16 DIAGNOSIS — Z7902 Long term (current) use of antithrombotics/antiplatelets: Secondary | ICD-10-CM | POA: Insufficient documentation

## 2018-03-16 DIAGNOSIS — I10 Essential (primary) hypertension: Secondary | ICD-10-CM

## 2018-03-16 DIAGNOSIS — Z7982 Long term (current) use of aspirin: Secondary | ICD-10-CM | POA: Insufficient documentation

## 2018-03-16 DIAGNOSIS — F319 Bipolar disorder, unspecified: Secondary | ICD-10-CM | POA: Insufficient documentation

## 2018-03-16 DIAGNOSIS — R55 Syncope and collapse: Secondary | ICD-10-CM | POA: Diagnosis not present

## 2018-03-16 DIAGNOSIS — E785 Hyperlipidemia, unspecified: Secondary | ICD-10-CM | POA: Insufficient documentation

## 2018-03-16 DIAGNOSIS — I358 Other nonrheumatic aortic valve disorders: Secondary | ICD-10-CM | POA: Diagnosis not present

## 2018-03-16 DIAGNOSIS — R001 Bradycardia, unspecified: Secondary | ICD-10-CM | POA: Diagnosis not present

## 2018-03-16 LAB — ECHOCARDIOGRAM COMPLETE: Weight: 1664 oz

## 2018-03-16 LAB — BASIC METABOLIC PANEL
ANION GAP: 8 (ref 5–15)
BUN: 33 mg/dL — AB (ref 6–20)
CHLORIDE: 104 mmol/L (ref 101–111)
CO2: 22 mmol/L (ref 22–32)
Calcium: 9.4 mg/dL (ref 8.9–10.3)
Creatinine, Ser: 0.96 mg/dL (ref 0.44–1.00)
GFR calc Af Amer: >60 mL/min (ref >60)
GFR, EST NON AFRICAN AMERICAN: 55 mL/min — AB (ref >60)
GLUCOSE: 92 mg/dL (ref 65–99)
POTASSIUM: 5 mmol/L (ref 3.5–5.1)
Sodium: 134 mmol/L — ABNORMAL LOW (ref 135–145)

## 2018-03-16 LAB — CBC
HEMATOCRIT: 36 % (ref 36.0–46.0)
HEMOGLOBIN: 10.9 g/dL — AB (ref 12.0–15.0)
MCH: 27.5 pg (ref 26.0–34.0)
MCHC: 30.3 g/dL (ref 30.0–36.0)
MCV: 90.7 fL (ref 78.0–100.0)
Platelets: 206 10*3/uL (ref 150–400)
RBC: 3.97 MIL/uL (ref 3.87–5.11)
RDW: 15.3 % (ref 11.5–15.5)
WBC: 7.3 10*3/uL (ref 4.0–10.5)

## 2018-03-16 NOTE — Telephone Encounter (Signed)
Message left for nurse [name].  Advised opening for Pt tomorrow for pacemaker placement.  Advised to have Pt hold Plavix tonight.  Direct number given to confirm.

## 2018-03-16 NOTE — Telephone Encounter (Signed)
Call back received from nurse.  Confirmed procedure.  Pt had labs drawn today.  Soap and instruction sheet for soap left at front desk for pick up.  Verbal instruction given to nurse.  Hold Plavix/ASA tonight prior to procedure.  No further needs at this time.

## 2018-03-16 NOTE — Progress Notes (Signed)
Patient ID: Meghan Oliver, female   DOB: 1941-07-18, 77 y.o.   MRN: [id number] PCP: Dr. Renold Genta Cardiology: Dr. Aundra Dubin  77 y.o. with history of bipolar disorder, carotid stenosis, HTN, CAD, and hyperlipidemia presents for acute visit for evaluation of lightheadedness and presyncope.  Last echo in 8/18 showed normal EF, mild AS, moderate AI.  She had DES to pLAD in 8/18.  She has severe depression and gets periodic ECT at [facility].   For about 3 years, she had episodes of burning in her lower neck , sometimes radiating up.  These episodes had no particular trigger (not meals or exercise).  In the past, her family had thought they represented GERD and she had been on a PPI and H-2 blocker as well as Mylanta prn.  She had a particularly bad episode in 7/18 and went to the ER.  She had a coronary CTA that showed extensive calcification in the proximal to mid LAD, no definite severe stenosis but cannot rule out.  The study quality was not good enough to interpret by FFR.  Therefore, I took her for coronary angiography in 8/18.  This showed a 90% proximal LAD stenosis that was treated with DES.  Unfortunately, coronary intervention did not stop her symptoms.  She continued to have the same periodic burning sensation in chest and lower neck, not exertional.  This finally seems to have mostly stopped.   The day after her cath in 8/18, she got up and was standing in the bathroom.  She passed out and fell to the ground.  She was unconscious only briefly.  She has had no repetition of this episode.    In 10/18, she had ECT then developed post-procedure bradycardia (junctional rhythm).  She had received 20 mg IV labetalol with ECT.  She was monitored overnight, junctional rhythm resolved.  She was seen by EP, recommended avoiding labetalol with ECT.  Since then, she has had 3 ECT sessions with no further problems.   For the last 2 weeks, she has felt weak.  At times, she is unable to stand.  She has had episodes where she  feels very lightheaded when standing up.  Symptoms wax and wane.  When she feels weak and lightheaded, family has taken her pulse and found it to be in the 30s.  She has not fallen but has come close.  She has not passed out.  She has not had chest pain.  She is not very active but denies dyspnea.  Her memory is poor, she is on Namenda.  ECG done today in the office shows HR around 60 but there appears to be sinus node dysfunction with a junctional escape.  BP is elevated currently.  She is not taking her amlodipine due to the dizzy spells.   Labs (4/16): K 3.3, creatinine 0.76 Labs (7/18): K 4.4, creatinine 1.02 Labs (8/18): LDL 81, HDL 119, creatinine 0.99, hgb 10.8 Labs (10/18): K 3.6, creatinine 0.77, hgb 10  ECG (personally reviewed): Occasional P waves seen on ECG tracing but primary rhythm appears to be a junctional escape with some PACs.   PMH: 1. GERD 2. Bipolar disorder: On lithium. History of ECT.  3. HTN 4. Tremor 5. Hyperlipidemia 6. Carotid stenosis: Carotid dopplers (7/14) with 40-59% bilateral ICA stenosis.  Carotid dopplers (7/15) with 40-59% BICA stenosis.  - Carotid dopplers (8/18): Mild BICA stenosis.  7. CAD: Coronary calcium seen on CT.   - ETT (7/13) with 5'45" exercise, 71% MPHR (wanted to stop), no  ischemic ECG changes but did not reach target heart rate.  - Lexiscan Cardiolite (4/16) with EF 76%, no ischemia/infarction.  - Coronary CTA (7/18): Interpretation limited by artifact.  Extensive calcification in the proximal to mid LAD, possible 50% stenosis but difficult to quantify due to artifact.   - LHC (8/18): 90% proximal LAD stenosis => DES placed.  - Cardiolite (9/18): EF 75%, low risk study with soft tissue attenuation, no ischemia.  8. Aortic valve disorder:  Echo (2/12) with EF 55-60%, mild AI and MR, PA systolic pressure 36 mmHg.  Echo (12/14) with EF 65-70%, mild LVH, mild AI, very mild AS, PA systolic pressure 44 mmHg.  - Echo (8/18) with EF 65-70%, mild AS,  moderate aortic insufficiency.  9. Lung nodules: CT chest 12/14 stable, no followup recommended.  10. Bradycardia: Had episode of junctional bradycardia associated with labetalol at time of ECT in 2018. .  - Sinus node dysfunction with junctional escape noted in 6/19.  11. Syncope: ?orthostatic/dehydration, 8/18.  12. OSA: Mild to moderate on 10/18 sleep study.  13. Dementia: Mild, on Namenda.   SH: Married to [name], quit smoking in 1972, 2 daughters.  FH: Father with CAD.  ROS: All systems reviewed and negative except as per HPI.   Current Outpatient Medications  Medication Sig Dispense Refill  . acetaminophen (TYLENOL) 500 MG tablet Take 1,000 mg by mouth every 6 (six) hours as needed for mild pain or headache.     Marland Kitchen amLODipine (NORVASC) 5 MG tablet Take 1 tablet (5 mg total) by mouth daily. (Patient taking differently: Take 2.5 mg by mouth daily. ) 90 tablet 3  . aspirin 81 MG tablet Take 81 mg by mouth at bedtime.     . Calcium Carbonate-Vitamin D (CALCIUM 600/VITAMIN D PO) Take 1 tablet by mouth 2 (two) times daily.     . cetirizine (ZYRTEC) 10 MG tablet Take 10 mg by mouth daily as needed for allergies.     . Cholecalciferol (VITAMIN D-3) 5000 units TABS Take 5,000 Units by mouth daily with breakfast.     . clopidogrel (PLAVIX) 75 MG tablet Take 1 tablet (75 mg total) by mouth daily with breakfast. (Patient taking differently: Take 75 mg daily by mouth. ) 30 tablet 12  . irbesartan (AVAPRO) 150 MG tablet TAKE 1 TABLET BY MOUTH DAILY (Patient taking differently: Take 150 mg by mouth daily as needed on ECT days) 90 tablet 3  . lactulose (CHRONULAC) 10 GM/15ML solution TAKE 15 ML BY MOUTH DAILY AS NEEDED FOR MILD CONSTIPATION 1350 mL 1  . Lavender Oil 80 MG CAPS Take 160 mg by mouth at bedtime.    Marland Kitchen levothyroxine (SYNTHROID, LEVOTHROID) 75 MCG tablet TAKE 1 TABLET(75 MCG) BY MOUTH DAILY 90 tablet 0  . lithium carbonate 150 MG capsule Take 300-450 mg by mouth See admin  instructions. Take 300 mg by mouth every other night alternating with 450 mg by mouth every other night    . LORazepam (ATIVAN) 0.5 MG tablet Take 0.25-0.5 mg by mouth See admin instructions. Take 0.25 mg by mouth at bedtime. Take 0.25-0.5 mg by mouth every 6 hours as needed for anxiety    . memantine (NAMENDA) 10 MG tablet Take 10 mg by mouth 2 (two) times daily.    . mirtazapine (REMERON) 7.5 MG tablet Take 7.5 mg by mouth at bedtime.     . Multiple Vitamins-Minerals (MULTIVITAMIN WITH MINERALS) tablet Take 1 tablet by mouth daily.      . nortriptyline (  PAMELOR) 25 MG capsule Take 75 mg by mouth at bedtime.    . Omega-3 Fatty Acids (OMEGA 3 PO) Take 1 capsule by mouth at bedtime.    Marland Kitchen omeprazole (PRILOSEC) 40 MG capsule Take one tablet before dinner (Patient taking differently: Take 40 mg by mouth daily before supper. ) 30 capsule 11  . OVER THE COUNTER MEDICATION Take 1 tablet by mouth 2 (two) times daily. *Cocavia*    . PARoxetine (PAXIL) 20 MG tablet Take 20 mg by mouth at bedtime.     . primidone (MYSOLINE) 50 MG tablet 2 tabs AM and 1 tab QHS (Patient taking differently: Take 50-100 mg by mouth See admin instructions. Take 100 mg by mouth in the morning and take 50 mg by mouth at bedtime) 270 tablet 3  . Pyridoxine HCl (VITAMIN B-6) 500 MG tablet Take 500 mg by mouth 2 (two) times daily.    . ranitidine (ZANTAC) 150 MG tablet TAKE 1 TABLET(150 MG) BY MOUTH TWICE DAILY 60 tablet 0  . rosuvastatin (CRESTOR) 40 MG tablet Take 1 tablet (40 mg total) by mouth daily. 30 tablet 6  . sennosides-docusate sodium (SENOKOT-S) 8.6-50 MG tablet Take 1 tablet by mouth at bedtime.    . TOVIAZ 8 MG TB24 tablet TAKE 1 TABLET BY MOUTH DAILY 90 tablet 0  . vitamin B-12 (CYANOCOBALAMIN) 1000 MCG tablet Take 1,000 mcg by mouth at bedtime.    . nitroGLYCERIN (NITROSTAT) 0.4 MG SL tablet Place 1 tablet (0.4 mg total) under the tongue every 5 (five) minutes as needed for chest pain. 25 tablet 3   No current  facility-administered medications for this encounter.     BP 102/82   Pulse (!) 47   Wt 104 lb (47.2 kg)   LMP  (LMP Unknown)   SpO2 97%   BMI 21.54 kg/m  General: NAD Neck: No JVD, no thyromegaly or thyroid nodule.  Lungs: Clear to auscultation bilaterally with normal respiratory effort. CV: Nondisplaced PMI.  Heart with some irregularity of S1/S2, no S3/S4, 2/6 SEM RUSB with clear S2.  No peripheral edema.  No carotid bruit.  Normal pedal pulses.  Abdomen: Soft, nontender, no hepatosplenomegaly, no distention.  Skin: Intact without lesions or rashes.  Neurologic: Alert and oriented x 3.  Psych: Normal affect. Extremities: No clubbing or cyanosis.  HEENT: Normal.   Assessment/Plan: 1. Symptomatic bradycardia: Suspect sinus node dysfunction with a junctional escape rhythm.  Family has noted HR down to 30s at home, HR in 30s correlates with dizzy/weak spells.  I think that she needs a pacemaker, hopefully can a-pace to correct problem and avoid RV pacing.  - Discussed with Dr Lovena Le, tentative plan for PPM placement tomorrow.  If she becomes more symptomatic overnight, should come to ER.   - She has a port in her right chest for IVs/blood draws with ECT.  She is not having ECT as often and hopefully could have port removed.  This will be infection risk with PPM. I discussed this with patient's caregiver and they will discuss with Dr. Lovena Le tomorrow.  2. Carotid stenosis: Mild disease only on last carotid dopplers.   3. CAD:  She had a concerning coronary CTA with extensive calcified plaque in the proximal to mid LAD. Due to artifact, hard to tell degree of stenosis and the study was not good enough to get FFR from it.  I was concerned that her symptoms could represent angina given the amount of coronary plaque she has => coronary angiography was  done, showing tight proximal LAD stenosis (8/18).  This was treated with DES.  PCI did not immediately help her symptoms (neck and chest burning).   This makes me suspect that her symptoms were GERD-related.  She had a Cardiolite post-PCI in 9/18 that showed no evidence for ischemia. Chest burning has actually subsided in the last few months.  - Continue ASA 81 and statin.  - She is on Plavix. Can hold peri-PPM and then restart as needed as she has been on for > 6 months post-DES. Would plan to continue at least until 8/19. 4. Aortic valve disorder: Mild AS, moderate AI on echo in 8/18.  I will arrange for repeat echo today before her PPM.   5. HTN: Labile.  Amlodipine currently on hold with symptomatic bradycardia. Will likely resume after PPM.   6. Hyperlipidemia: Continue Crestor, needs to get lipids checked.  7. GERD: Chest/neck symptoms did not change with PCI.  I suspect GERD may have been causing her symptoms.   8. Depression: Profound. She is getting ECT though not as often as in the past.    Loralie Champagne 03/16/2018

## 2018-03-16 NOTE — Patient Instructions (Signed)
Dr. Lovena Le office will call you  Your physician has requested that you have an echocardiogram. Echocardiography is a painless test that uses sound waves to create images of your heart. It provides your doctor with information about the size and shape of your heart and how well your heart's chambers and valves are working. This procedure takes approximately one hour. There are no restrictions for this procedure.  (they will call you)   Labs drawn today (if we do not call you, then your lab work was stable) '   Your physician recommends that you schedule a follow-up appointment in: 3 months with Dr. Aundra Dubin

## 2018-03-17 ENCOUNTER — Encounter (HOSPITAL_COMMUNITY)
Admission: RE | Disposition: A | Discharge status: Home / Self Care | Payer: Self-pay | Source: Ambulatory Visit | Attending: Internal Medicine

## 2018-03-17 ENCOUNTER — Encounter (HOSPITAL_COMMUNITY): Payer: Self-pay | Admitting: General Practice

## 2018-03-17 ENCOUNTER — Ambulatory Visit (HOSPITAL_COMMUNITY)
Admission: RE | Admit: 2018-03-17 | Discharge: 2018-03-18 | Disposition: A | Discharge status: Home / Self Care | Payer: Medicare Other | Source: Ambulatory Visit | Attending: Internal Medicine | Admitting: Internal Medicine

## 2018-03-17 ENCOUNTER — Other Ambulatory Visit: Payer: Self-pay

## 2018-03-17 DIAGNOSIS — F0390 Unspecified dementia without behavioral disturbance: Secondary | ICD-10-CM | POA: Diagnosis not present

## 2018-03-17 DIAGNOSIS — Z87891 Personal history of nicotine dependence: Secondary | ICD-10-CM | POA: Diagnosis not present

## 2018-03-17 DIAGNOSIS — Z9889 Other specified postprocedural states: Secondary | ICD-10-CM | POA: Insufficient documentation

## 2018-03-17 DIAGNOSIS — I351 Nonrheumatic aortic (valve) insufficiency: Secondary | ICD-10-CM | POA: Diagnosis not present

## 2018-03-17 DIAGNOSIS — Z950 Presence of cardiac pacemaker: Secondary | ICD-10-CM

## 2018-03-17 DIAGNOSIS — Z7901 Long term (current) use of anticoagulants: Secondary | ICD-10-CM | POA: Insufficient documentation

## 2018-03-17 DIAGNOSIS — R918 Other nonspecific abnormal finding of lung field: Secondary | ICD-10-CM | POA: Insufficient documentation

## 2018-03-17 DIAGNOSIS — I700 Atherosclerosis of aorta: Secondary | ICD-10-CM | POA: Diagnosis not present

## 2018-03-17 DIAGNOSIS — I2510 Atherosclerotic heart disease of native coronary artery without angina pectoris: Secondary | ICD-10-CM | POA: Diagnosis present

## 2018-03-17 DIAGNOSIS — Z7982 Long term (current) use of aspirin: Secondary | ICD-10-CM | POA: Insufficient documentation

## 2018-03-17 DIAGNOSIS — Z452 Encounter for adjustment and management of vascular access device: Secondary | ICD-10-CM | POA: Diagnosis not present

## 2018-03-17 DIAGNOSIS — I495 Sick sinus syndrome: Secondary | ICD-10-CM | POA: Diagnosis present

## 2018-03-17 DIAGNOSIS — I10 Essential (primary) hypertension: Secondary | ICD-10-CM | POA: Insufficient documentation

## 2018-03-17 DIAGNOSIS — E785 Hyperlipidemia, unspecified: Secondary | ICD-10-CM | POA: Diagnosis not present

## 2018-03-17 DIAGNOSIS — Z8249 Family history of ischemic heart disease and other diseases of the circulatory system: Secondary | ICD-10-CM | POA: Diagnosis not present

## 2018-03-17 DIAGNOSIS — Z79890 Hormone replacement therapy (postmenopausal): Secondary | ICD-10-CM | POA: Diagnosis not present

## 2018-03-17 DIAGNOSIS — K219 Gastro-esophageal reflux disease without esophagitis: Secondary | ICD-10-CM | POA: Diagnosis not present

## 2018-03-17 DIAGNOSIS — Z79899 Other long term (current) drug therapy: Secondary | ICD-10-CM | POA: Diagnosis not present

## 2018-03-17 DIAGNOSIS — F319 Bipolar disorder, unspecified: Secondary | ICD-10-CM | POA: Diagnosis present

## 2018-03-17 DIAGNOSIS — I6529 Occlusion and stenosis of unspecified carotid artery: Secondary | ICD-10-CM | POA: Insufficient documentation

## 2018-03-17 DIAGNOSIS — R001 Bradycardia, unspecified: Secondary | ICD-10-CM | POA: Diagnosis present

## 2018-03-17 DIAGNOSIS — R011 Cardiac murmur, unspecified: Secondary | ICD-10-CM | POA: Diagnosis present

## 2018-03-17 HISTORY — DX: Personal history of other medical treatment: Z92.89

## 2018-03-17 HISTORY — PX: INSERT / REPLACE / REMOVE PACEMAKER: SUR710

## 2018-03-17 HISTORY — PX: PACEMAKER IMPLANT: EP1218

## 2018-03-17 HISTORY — DX: Presence of cardiac pacemaker: Z95.0

## 2018-03-17 HISTORY — PX: PORTA CATH REMOVAL: CATH118286

## 2018-03-17 LAB — SURGICAL PCR SCREEN
MRSA, PCR: NEGATIVE
Staphylococcus aureus: NEGATIVE

## 2018-03-17 SURGERY — PACEMAKER IMPLANT
Anesthesia: LOCAL

## 2018-03-17 MED ORDER — LIDOCAINE HCL (PF) 1 % IJ SOLN
INTRAMUSCULAR | Status: AC
  Filled 2018-03-17: qty 30

## 2018-03-17 MED ORDER — DIPHENHYDRAMINE HCL 25 MG PO CAPS
25.0000 mg | ORAL_CAPSULE | ORAL | Status: AC
  Administered 2018-03-17: 25 mg via ORAL
  Filled 2018-03-17: qty 1

## 2018-03-17 MED ORDER — MIDAZOLAM HCL 5 MG/5ML IJ SOLN
INTRAMUSCULAR | Status: AC
  Filled 2018-03-17: qty 5

## 2018-03-17 MED ORDER — VITAMIN B-12 1000 MCG PO TABS
1000.0000 ug | ORAL_TABLET | Freq: Every day | ORAL | Status: DC
  Administered 2018-03-17: 1000 ug via ORAL
  Filled 2018-03-17: qty 1

## 2018-03-17 MED ORDER — FENTANYL CITRATE (PF) 100 MCG/2ML IJ SOLN
INTRAMUSCULAR | Status: AC
  Filled 2018-03-17: qty 2

## 2018-03-17 MED ORDER — SENNOSIDES-DOCUSATE SODIUM 8.6-50 MG PO TABS
1.0000 | ORAL_TABLET | Freq: Every day | ORAL | Status: DC
  Administered 2018-03-17: 1 via ORAL
  Filled 2018-03-17: qty 1

## 2018-03-17 MED ORDER — ONDANSETRON HCL 4 MG/2ML IJ SOLN: 4.0000 mg | Freq: Four times a day (QID) | INTRAMUSCULAR | Status: DC | PRN

## 2018-03-17 MED ORDER — CEFAZOLIN SODIUM-DEXTROSE 2-4 GM/100ML-% IV SOLN
2.0000 g | INTRAVENOUS | Status: AC
  Administered 2018-03-17: 2 g via INTRAVENOUS

## 2018-03-17 MED ORDER — YOU HAVE A PACEMAKER BOOK
Freq: Once | Status: AC
  Administered 2018-03-17: 21:00:00 1
  Filled 2018-03-17: qty 1

## 2018-03-17 MED ORDER — ROSUVASTATIN CALCIUM 40 MG PO TABS
40.0000 mg | ORAL_TABLET | Freq: Every day | ORAL | Status: DC
  Administered 2018-03-17: 40 mg via ORAL
  Filled 2018-03-17: qty 1
  Filled 2018-03-17: qty 2

## 2018-03-17 MED ORDER — ACETAMINOPHEN 325 MG PO TABS
325.0000 mg | ORAL_TABLET | ORAL | Status: DC | PRN
  Administered 2018-03-17 – 2018-03-18 (×3): 650 mg via ORAL
  Filled 2018-03-17 (×3): qty 2

## 2018-03-17 MED ORDER — NITROGLYCERIN 0.4 MG SL SUBL: 0.4000 mg | SUBLINGUAL_TABLET | SUBLINGUAL | Status: DC | PRN

## 2018-03-17 MED ORDER — LIDOCAINE HCL (PF) 1 % IJ SOLN
INTRAMUSCULAR | Status: DC | PRN
  Administered 2018-03-17: 90 mL

## 2018-03-17 MED ORDER — SODIUM CHLORIDE 0.9 % IV SOLN
80.0000 mg | INTRAVENOUS | Status: AC
  Administered 2018-03-17: 80 mg

## 2018-03-17 MED ORDER — LITHIUM CARBONATE ER 300 MG PO TBCR
300.0000 mg | EXTENDED_RELEASE_TABLET | ORAL | Status: DC
  Administered 2018-03-17: 21:00:00 300 mg via ORAL
  Filled 2018-03-17: qty 1

## 2018-03-17 MED ORDER — PAROXETINE HCL 20 MG PO TABS
20.0000 mg | ORAL_TABLET | Freq: Every day | ORAL | Status: DC
  Administered 2018-03-17: 21:00:00 20 mg via ORAL
  Filled 2018-03-17: qty 1

## 2018-03-17 MED ORDER — SALINE SPRAY 0.65 % NA SOLN
1.0000 | NASAL | Status: DC | PRN
  Administered 2018-03-17: 21:00:00 1 via NASAL
  Filled 2018-03-17: qty 44

## 2018-03-17 MED ORDER — VITAMIN D3 25 MCG (1000 UNIT) PO TABS
5000.0000 [IU] | ORAL_TABLET | Freq: Every day | ORAL | Status: DC
  Filled 2018-03-17: qty 5

## 2018-03-17 MED ORDER — LORAZEPAM 0.5 MG PO TABS
0.2500 mg | ORAL_TABLET | Freq: Every day | ORAL | Status: DC
  Administered 2018-03-17: 0.25 mg via ORAL
  Filled 2018-03-17: qty 1

## 2018-03-17 MED ORDER — IRBESARTAN 150 MG PO TABS
150.0000 mg | ORAL_TABLET | Freq: Every day | ORAL | Status: DC
  Filled 2018-03-17: qty 1

## 2018-03-17 MED ORDER — CHLORHEXIDINE GLUCONATE 4 % EX LIQD: 60.0000 mL | Freq: Once | CUTANEOUS | Status: DC

## 2018-03-17 MED ORDER — FESOTERODINE FUMARATE ER 8 MG PO TB24
8.0000 mg | ORAL_TABLET | Freq: Every day | ORAL | Status: DC
  Filled 2018-03-17: qty 1

## 2018-03-17 MED ORDER — ACETAMINOPHEN 500 MG PO TABS: 1000.0000 mg | ORAL_TABLET | Freq: Four times a day (QID) | ORAL | Status: DC | PRN

## 2018-03-17 MED ORDER — LORAZEPAM 0.5 MG PO TABS: 0.2500 mg | ORAL_TABLET | Freq: Four times a day (QID) | ORAL | Status: DC | PRN

## 2018-03-17 MED ORDER — IOPAMIDOL (ISOVUE-370) INJECTION 76%
INTRAVENOUS | Status: DC | PRN
  Administered 2018-03-17: 10 mL via INTRAVENOUS

## 2018-03-17 MED ORDER — MUPIROCIN 2 % EX OINT
TOPICAL_OINTMENT | CUTANEOUS | Status: AC
  Administered 2018-03-17: 1
  Filled 2018-03-17: qty 22

## 2018-03-17 MED ORDER — IOPAMIDOL (ISOVUE-370) INJECTION 76%
INTRAVENOUS | Status: AC
  Filled 2018-03-17: qty 50

## 2018-03-17 MED ORDER — VITAMIN B-6 100 MG PO TABS
500.0000 mg | ORAL_TABLET | Freq: Two times a day (BID) | ORAL | Status: DC
  Administered 2018-03-17: 21:00:00 500 mg via ORAL
  Filled 2018-03-17 (×2): qty 5

## 2018-03-17 MED ORDER — LITHIUM CARBONATE ER 450 MG PO TBCR: 450.0000 mg | EXTENDED_RELEASE_TABLET | ORAL | Status: DC

## 2018-03-17 MED ORDER — CEFAZOLIN SODIUM-DEXTROSE 2-4 GM/100ML-% IV SOLN
INTRAVENOUS | Status: AC
  Filled 2018-03-17: qty 100

## 2018-03-17 MED ORDER — AMLODIPINE BESYLATE 5 MG PO TABS: 5.0000 mg | ORAL_TABLET | Freq: Every day | ORAL | Status: DC

## 2018-03-17 MED ORDER — SODIUM CHLORIDE 0.9 % IV SOLN: INTRAVENOUS | Status: DC

## 2018-03-17 MED ORDER — SODIUM CHLORIDE 0.9 % IV SOLN
INTRAVENOUS | Status: AC
  Filled 2018-03-17: qty 2

## 2018-03-17 MED ORDER — FENTANYL CITRATE (PF) 100 MCG/2ML IJ SOLN
INTRAMUSCULAR | Status: DC | PRN
  Administered 2018-03-17 (×10): 12.5 ug via INTRAVENOUS

## 2018-03-17 MED ORDER — MIRTAZAPINE 7.5 MG PO TABS
7.5000 mg | ORAL_TABLET | Freq: Every day | ORAL | Status: DC
  Administered 2018-03-17: 7.5 mg via ORAL
  Filled 2018-03-17: qty 1

## 2018-03-17 MED ORDER — MEMANTINE HCL 10 MG PO TABS
10.0000 mg | ORAL_TABLET | Freq: Two times a day (BID) | ORAL | Status: DC
  Administered 2018-03-17: 10 mg via ORAL
  Filled 2018-03-17 (×2): qty 1

## 2018-03-17 MED ORDER — MIDAZOLAM HCL 5 MG/5ML IJ SOLN
INTRAMUSCULAR | Status: DC | PRN
  Administered 2018-03-17 (×10): 1 mg via INTRAVENOUS

## 2018-03-17 MED ORDER — CEFAZOLIN SODIUM-DEXTROSE 1-4 GM/50ML-% IV SOLN
1.0000 g | Freq: Four times a day (QID) | INTRAVENOUS | Status: AC
  Administered 2018-03-17 – 2018-03-18 (×2): 1 g via INTRAVENOUS
  Filled 2018-03-17 (×2): qty 50

## 2018-03-17 MED ORDER — LITHIUM CARBONATE 300 MG PO CAPS: 300.0000 mg | ORAL_CAPSULE | ORAL | Status: DC

## 2018-03-17 MED ORDER — ENSURE ENLIVE PO LIQD
237.0000 mL | Freq: Two times a day (BID) | ORAL | Status: DC
  Filled 2018-03-17 (×4): qty 237

## 2018-03-17 MED ORDER — ADULT MULTIVITAMIN W/MINERALS CH: 1.0000 | ORAL_TABLET | Freq: Every day | ORAL | Status: DC

## 2018-03-17 MED ORDER — NORTRIPTYLINE HCL 25 MG PO CAPS
75.0000 mg | ORAL_CAPSULE | Freq: Every day | ORAL | Status: DC
  Administered 2018-03-17: 21:00:00 75 mg via ORAL
  Filled 2018-03-17: qty 3

## 2018-03-17 SURGICAL SUPPLY — 8 items
CABLE SURGICAL S-101-97-12 (CABLE) ×1 IMPLANT
LEAD TENDRIL MRI 46CM LPA1200M (Lead) ×1 IMPLANT
LEAD TENDRIL MRI 52CM LPA1200M (Lead) ×1 IMPLANT
PACEMAKER ASSURITY DR-RF (Pacemaker) ×1 IMPLANT
PAD DEFIB LIFELINK (PAD) ×1 IMPLANT
SHEATH CLASSIC 8F (SHEATH) ×2 IMPLANT
TRAY PACEMAKER INSERTION (PACKS) ×1 IMPLANT
WIRE HI TORQ VERSACORE-J 145CM (WIRE) ×1 IMPLANT

## 2018-03-17 NOTE — H&P (Signed)
Patient ID: Meghan Oliver, female   DOB: [birth year], 77 y.o.   MRN: [id number] PCP: Dr. Renold Genta Cardiology: Dr. Aundra Dubin  77 y.o. with history of bipolar disorder, carotid stenosis, HTN, CAD, and hyperlipidemia presents for acute visit for evaluation of lightheadedness and presyncope.  Last echo in 8/18 showed normal EF, mild AS, moderate AI.  She had DES to pLAD in 8/18.  She has severe depression and gets periodic ECT at [facility].   For about 3 years, she had episodes of burning in her lower neck , sometimes radiating up.  These episodes had no particular trigger (not meals or exercise).  In the past, her family had thought they represented GERD and she had been on a PPI and H-2 blocker as well as Mylanta prn.  She had a particularly bad episode in 7/18 and went to the ER.  She had a coronary CTA that showed extensive calcification in the proximal to mid LAD, no definite severe stenosis but cannot rule out.  The study quality was not good enough to interpret by FFR.  Therefore, I took her for coronary angiography in 8/18.  This showed a 90% proximal LAD stenosis that was treated with DES.  Unfortunately, coronary intervention did not stop her symptoms.  She continued to have the same periodic burning sensation in chest and lower neck, not exertional.  This finally seems to have mostly stopped.   The day after her cath in 8/18, she got up and was standing in the bathroom.  She passed out and fell to the ground.  She was unconscious only briefly.  She has had no repetition of this episode.    In 10/18, she had ECT then developed post-procedure bradycardia (junctional rhythm).  She had received 20 mg IV labetalol with ECT.  She was monitored overnight, junctional rhythm resolved.  She was seen by EP, recommended avoiding labetalol with ECT.  Since then, she has had 3 ECT sessions with no further problems.   For the last 2 weeks, she has felt weak.  At times, she is unable to stand.  She has had episodes where  she feels very lightheaded when standing up.  Symptoms wax and wane.  When she feels weak and lightheaded, family has taken her pulse and found it to be in the 30s.  She has not fallen but has come close.  She has not passed out.  She has not had chest pain.  She is not very active but denies dyspnea.  Her memory is poor, she is on Namenda.  ECG done today in the office shows HR around 60 but there appears to be sinus node dysfunction with a junctional escape.  BP is elevated currently.  She is not taking her amlodipine due to the dizzy spells.   Labs (4/16): K 3.3, creatinine 0.76 Labs (7/18): K 4.4, creatinine 1.02 Labs (8/18): LDL 81, HDL 119, creatinine 0.99, hgb 10.8 Labs (10/18): K 3.6, creatinine 0.77, hgb 10  ECG (personally reviewed): Occasional P waves seen on ECG tracing but primary rhythm appears to be a junctional escape with some PACs.   PMH: 1. GERD 2. Bipolar disorder: On lithium. History of ECT.  3. HTN 4. Tremor 5. Hyperlipidemia 6. Carotid stenosis: Carotid dopplers (7/14) with 40-59% bilateral ICA stenosis.  Carotid dopplers (7/15) with 40-59% BICA stenosis.  - Carotid dopplers (8/18): Mild BICA stenosis.  7. CAD: Coronary calcium seen on CT.   - ETT (7/13) with 5'45" exercise, 71% MPHR (wanted to stop), no  ischemic ECG changes but did not reach target heart rate.  - Lexiscan Cardiolite (4/16) with EF 76%, no ischemia/infarction.  - Coronary CTA (7/18): Interpretation limited by artifact.  Extensive calcification in the proximal to mid LAD, possible 50% stenosis but difficult to quantify due to artifact.   - LHC (8/18): 90% proximal LAD stenosis => DES placed.  - Cardiolite (9/18): EF 75%, low risk study with soft tissue attenuation, no ischemia.  8. Aortic valve disorder:  Echo (2/12) with EF 55-60%, mild AI and MR, PA systolic pressure 36 mmHg.  Echo (12/14) with EF 65-70%, mild LVH, mild AI, very mild AS, PA systolic pressure 44 mmHg.  - Echo (8/18) with EF 65-70%,  mild AS, moderate aortic insufficiency.  9. Lung nodules: CT chest 12/14 stable, no followup recommended.  10. Bradycardia: Had episode of junctional bradycardia associated with labetalol at time of ECT in 2018. .  - Sinus node dysfunction with junctional escape noted in 6/19.  11. Syncope: ?orthostatic/dehydration, 8/18.  12. OSA: Mild to moderate on 10/18 sleep study.  13. Dementia: Mild, on Namenda.   SH: Married to [name], quit smoking in 1972, 2 daughters.  FH: Father with CAD.  ROS: All systems reviewed and negative except as per HPI.         Current Outpatient Medications  Medication Sig Dispense Refill  . acetaminophen (TYLENOL) 500 MG tablet Take 1,000 mg by mouth every 6 (six) hours as needed for mild pain or headache.     Marland Kitchen amLODipine (NORVASC) 5 MG tablet Take 1 tablet (5 mg total) by mouth daily. (Patient taking differently: Take 2.5 mg by mouth daily. ) 90 tablet 3  . aspirin 81 MG tablet Take 81 mg by mouth at bedtime.     . Calcium Carbonate-Vitamin D (CALCIUM 600/VITAMIN D PO) Take 1 tablet by mouth 2 (two) times daily.     . cetirizine (ZYRTEC) 10 MG tablet Take 10 mg by mouth daily as needed for allergies.     . Cholecalciferol (VITAMIN D-3) 5000 units TABS Take 5,000 Units by mouth daily with breakfast.     . clopidogrel (PLAVIX) 75 MG tablet Take 1 tablet (75 mg total) by mouth daily with breakfast. (Patient taking differently: Take 75 mg daily by mouth. ) 30 tablet 12  . irbesartan (AVAPRO) 150 MG tablet TAKE 1 TABLET BY MOUTH DAILY (Patient taking differently: Take 150 mg by mouth daily as needed on ECT days) 90 tablet 3  . lactulose (CHRONULAC) 10 GM/15ML solution TAKE 15 ML BY MOUTH DAILY AS NEEDED FOR MILD CONSTIPATION 1350 mL 1  . Lavender Oil 80 MG CAPS Take 160 mg by mouth at bedtime.    Marland Kitchen levothyroxine (SYNTHROID, LEVOTHROID) 75 MCG tablet TAKE 1 TABLET(75 MCG) BY MOUTH DAILY 90 tablet 0  . lithium carbonate 150 MG capsule Take 300-450 mg  by mouth See admin instructions. Take 300 mg by mouth every other night alternating with 450 mg by mouth every other night    . LORazepam (ATIVAN) 0.5 MG tablet Take 0.25-0.5 mg by mouth See admin instructions. Take 0.25 mg by mouth at bedtime. Take 0.25-0.5 mg by mouth every 6 hours as needed for anxiety    . memantine (NAMENDA) 10 MG tablet Take 10 mg by mouth 2 (two) times daily.    . mirtazapine (REMERON) 7.5 MG tablet Take 7.5 mg by mouth at bedtime.     . Multiple Vitamins-Minerals (MULTIVITAMIN WITH MINERALS) tablet Take 1 tablet by mouth daily.      Marland Kitchen  nortriptyline (PAMELOR) 25 MG capsule Take 75 mg by mouth at bedtime.    . Omega-3 Fatty Acids (OMEGA 3 PO) Take 1 capsule by mouth at bedtime.    Marland Kitchen omeprazole (PRILOSEC) 40 MG capsule Take one tablet before dinner (Patient taking differently: Take 40 mg by mouth daily before supper. ) 30 capsule 11  . OVER THE COUNTER MEDICATION Take 1 tablet by mouth 2 (two) times daily. *Cocavia*    . PARoxetine (PAXIL) 20 MG tablet Take 20 mg by mouth at bedtime.     . primidone (MYSOLINE) 50 MG tablet 2 tabs AM and 1 tab QHS (Patient taking differently: Take 50-100 mg by mouth See admin instructions. Take 100 mg by mouth in the morning and take 50 mg by mouth at bedtime) 270 tablet 3  . Pyridoxine HCl (VITAMIN B-6) 500 MG tablet Take 500 mg by mouth 2 (two) times daily.    . ranitidine (ZANTAC) 150 MG tablet TAKE 1 TABLET(150 MG) BY MOUTH TWICE DAILY 60 tablet 0  . rosuvastatin (CRESTOR) 40 MG tablet Take 1 tablet (40 mg total) by mouth daily. 30 tablet 6  . sennosides-docusate sodium (SENOKOT-S) 8.6-50 MG tablet Take 1 tablet by mouth at bedtime.    . TOVIAZ 8 MG TB24 tablet TAKE 1 TABLET BY MOUTH DAILY 90 tablet 0  . vitamin B-12 (CYANOCOBALAMIN) 1000 MCG tablet Take 1,000 mcg by mouth at bedtime.    . nitroGLYCERIN (NITROSTAT) 0.4 MG SL tablet Place 1 tablet (0.4 mg total) under the tongue every 5 (five) minutes as needed for  chest pain. 25 tablet 3   No current facility-administered medications for this encounter.     BP 102/82   Pulse (!) 47   Wt 104 lb (47.2 kg)   LMP  (LMP Unknown)   SpO2 97%   BMI 21.54 kg/m  General: NAD Neck: No JVD, no thyromegaly or thyroid nodule.  Lungs: Clear to auscultation bilaterally with normal respiratory effort. CV: Nondisplaced PMI.  Heart with some irregularity of S1/S2, no S3/S4, 2/6 SEM RUSB with clear S2.  No peripheral edema.  No carotid bruit.  Normal pedal pulses.  Abdomen: Soft, nontender, no hepatosplenomegaly, no distention.  Skin: Intact without lesions or rashes.  Neurologic: Alert and oriented x 3.  Psych: Normal affect. Extremities: No clubbing or cyanosis.  HEENT: Normal.   Assessment/Plan: 1. Symptomatic bradycardia: Suspect sinus node dysfunction with a junctional escape rhythm.  Family has noted HR down to 30s at home, HR in 30s correlates with dizzy/weak spells.  I think that she needs a pacemaker, hopefully can a-pace to correct problem and avoid RV pacing.  - Discussed with Dr Lovena Le, tentative plan for PPM placement tomorrow.  If she becomes more symptomatic overnight, should come to ER.   - She has a port in her right chest for IVs/blood draws with ECT.  She is not having ECT as often and hopefully could have port removed.  This will be infection risk with PPM. I discussed this with patient's caregiver and they will discuss with Dr. Lovena Le tomorrow.  2. Carotid stenosis: Mild disease only on last carotid dopplers.   3. CAD:  She had a concerning coronary CTA with extensive calcified plaque in the proximal to mid LAD. Due to artifact, hard to tell degree of stenosis and the study was not good enough to get FFR from it.  I was concerned that her symptoms could represent angina given the amount of coronary plaque she has => coronary angiography  was done, showing tight proximal LAD stenosis (8/18).  This was treated with DES.  PCI did not immediately  help her symptoms (neck and chest burning).  This makes me suspect that her symptoms were GERD-related.  She had a Cardiolite post-PCI in 9/18 that showed no evidence for ischemia. Chest burning has actually subsided in the last few months.  - Continue ASA 81 and statin.  - She is on Plavix. Can hold peri-PPM and then restart as needed as she has been on for > 6 months post-DES. Would plan to continue at least until 8/19. 4. Aortic valve disorder: Mild AS, moderate AI on echo in 8/18.  I will arrange for repeat echo today before her PPM.   5. HTN: Labile.  Amlodipine currently on hold with symptomatic bradycardia. Will likely resume after PPM.   6. Hyperlipidemia: Continue Crestor, needs to get lipids checked.  7. GERD: Chest/neck symptoms did not change with PCI.  I suspect GERD may have been causing her symptoms.   8. Depression: Profound. She is getting ECT though not as often as in the past.    Loralie Champagne 03/16/2018  EP Attending  Patient seen and examined. Agree with above. The patient presents today with symptomatic sinus node dysfunction and HR's in the 30's at home. She has an indwelling port a cath in the right IJ. She is on plavix and this has been held. She had an LAD stent about 10 months ago. Her exam is notable for a malnourished appearing woman, NAD with clear lungs and a Reg bradycardia. No edema.  A/P 1. Sinus node dysfunction - I have ediscussed the treatment optons with the patient and her husband and the risks/benefits/goals/expectations of PPM insertion and removal of her port a cath have been reviewed and she wishes to proceed.  Mikle Bosworth.D.

## 2018-03-18 ENCOUNTER — Encounter (HOSPITAL_COMMUNITY): Payer: Self-pay | Admitting: Cardiology

## 2018-03-18 ENCOUNTER — Ambulatory Visit (HOSPITAL_COMMUNITY): Payer: Medicare Other

## 2018-03-18 DIAGNOSIS — Z950 Presence of cardiac pacemaker: Secondary | ICD-10-CM

## 2018-03-18 DIAGNOSIS — E785 Hyperlipidemia, unspecified: Secondary | ICD-10-CM | POA: Diagnosis not present

## 2018-03-18 DIAGNOSIS — Z87891 Personal history of nicotine dependence: Secondary | ICD-10-CM | POA: Diagnosis not present

## 2018-03-18 DIAGNOSIS — I2510 Atherosclerotic heart disease of native coronary artery without angina pectoris: Secondary | ICD-10-CM | POA: Diagnosis not present

## 2018-03-18 DIAGNOSIS — I495 Sick sinus syndrome: Secondary | ICD-10-CM | POA: Diagnosis not present

## 2018-03-18 DIAGNOSIS — F0390 Unspecified dementia without behavioral disturbance: Secondary | ICD-10-CM | POA: Diagnosis not present

## 2018-03-18 DIAGNOSIS — I700 Atherosclerosis of aorta: Secondary | ICD-10-CM | POA: Diagnosis not present

## 2018-03-18 DIAGNOSIS — Z8249 Family history of ischemic heart disease and other diseases of the circulatory system: Secondary | ICD-10-CM | POA: Diagnosis not present

## 2018-03-18 DIAGNOSIS — K219 Gastro-esophageal reflux disease without esophagitis: Secondary | ICD-10-CM | POA: Diagnosis not present

## 2018-03-18 DIAGNOSIS — I10 Essential (primary) hypertension: Secondary | ICD-10-CM | POA: Diagnosis not present

## 2018-03-18 DIAGNOSIS — I351 Nonrheumatic aortic (valve) insufficiency: Secondary | ICD-10-CM | POA: Diagnosis not present

## 2018-03-18 DIAGNOSIS — I6529 Occlusion and stenosis of unspecified carotid artery: Secondary | ICD-10-CM | POA: Diagnosis not present

## 2018-03-18 DIAGNOSIS — Z452 Encounter for adjustment and management of vascular access device: Secondary | ICD-10-CM | POA: Diagnosis not present

## 2018-03-18 HISTORY — DX: Presence of cardiac pacemaker: Z95.0

## 2018-03-18 MED ORDER — OMEPRAZOLE 40 MG PO CPDR: 40.0000 mg | DELAYED_RELEASE_CAPSULE | Freq: Every day | ORAL | Status: DC

## 2018-03-18 MED ORDER — PRIMIDONE 50 MG PO TABS: 50.0000 mg | ORAL_TABLET | ORAL | Status: DC

## 2018-03-18 MED ORDER — CLOPIDOGREL BISULFATE 75 MG PO TABS: 75.0000 mg | ORAL_TABLET | Freq: Every day | ORAL | 12 refills | Status: DC

## 2018-03-18 MED ORDER — AMLODIPINE BESYLATE 5 MG PO TABS: 2.5000 mg | ORAL_TABLET | Freq: Every day | ORAL | Status: DC

## 2018-03-18 NOTE — Discharge Instructions (Signed)
° ° °  Supplemental Discharge Instructions for  Pacemaker Patients  Activity No heavy lifting or vigorous activity with your left/right arm for 6 to 8 weeks.  Do not raise your left/right arm above your head for one week.  Gradually raise your affected arm as drawn below.              03/21/18                    03/22/18                     03/23/18                  03/24/18 __  NO DRIVING for 1 week  ; you may begin driving on   8/45/36  .  WOUND CARE (both sites) - Keep the wound area clean and dry.  Do not get this area wet for one week. No showers for one week; you may shower on 03/24/18 . - The tape/steri-strips on your wound will fall off; do not pull them off.  No bandage is needed on the site.  DO  NOT apply any creams, oils, or ointments to the wound area. - If you notice any drainage or discharge from the wound, any swelling or bruising at the site, or you develop a fever > 101? F after you are discharged home, call the office at once.  Special Instructions - You are still able to use cellular telephones; use the ear opposite the side where you have your pacemaker/defibrillator.  Avoid carrying your cellular phone near your device. - When traveling through airports, show security personnel your identification card to avoid being screened in the metal detectors.  Ask the security personnel to use the hand wand. - Avoid arc welding equipment, MRI testing (magnetic resonance imaging), TENS units (transcutaneous nerve stimulators).  Call the office for questions about other devices. - Avoid electrical appliances that are in poor condition or are not properly grounded. - Microwave ovens are safe to be near or to operate.  Heart Healthy Diet   NO PLAVIX UNTIL WOUND CHECK in the office.

## 2018-03-18 NOTE — Discharge Summary (Addendum)
Discharge Summary    Patient ID: Meghan Oliver,  MRN: [id number], DOB/AGE: 10-Jan-1941 77 y.o.  Admit date: 03/17/2018 Discharge date: 03/18/2018  Primary Care Provider: [name] Primary Cardiologist: Loralie Champagne, MD  Discharge Diagnoses    Principal Problem:   Sinus node dysfunction [facility]) Active Problems:   S/P placement of cardiac pacemaker 03/17/18 ST Jude    Bipolar disorder ([address])   SYSTOLIC MURMUR   CAD (coronary artery disease)   Bradycardia   Allergies Allergies  Allergen Reactions  . Propranolol Other (See Comments)    Low blood pressure  . Brexpiprazole Other (See Comments)    Aphasia and catatonia    Diagnostic Studies/Procedures    03/17/18 PPM Procedures   PACEMAKER IMPLANT  PORTA CATH REMOVAL  Indications   Sinus node dysfunction (HCC) [I49.5 (ICD-10-CM)]  Conclusion   CONCLUSIONS:   1. Successful implantation of a St. Jude dual-chamber pacemaker for symptomatic bradycardia due to sinus node dysfunction 2. Successful removal of a Port-o -cath from the right internal jugular vein.  3. No early apparent complications.           Cristopher Peru, MD   03/17/18  Procedures   PACEMAKER IMPLANT  PORTA CATH REMOVAL  Conclusion   CONCLUSIONS:   1. Successful implantation of a St. Jude dual-chamber pacemaker for symptomatic bradycardia due to sinus node dysfunction 2. Successful removal of a Port-o -cath from the right internal jugular vein.  3. No early apparent complications.        _____________   History of Present Illness     77 yof with hx Bipolar disorder, carotid stenosis, HTN, CAD, HLD, seen for lightheadedness and presyncope.  Hx of normal EF in 8/18 and mild AS moderate AI.  Hx of DES to pLAD 05/2017.  Severe depression with ECT at [facility].  Pt with syncope 8/18 aftoer cardiac cath.  No repetition of this episode.  After and episode of ECT she has junctional rhythm.  This was after IV labetalol.  For 2 weeks prior to admit she was  weak and unable to stand at times.  With these episodes her family would check her pulse and her HR was 30s. Has been close to falling at times.  Office EKG with sinus node dysfunction and junctional escape.  She stopped amlodipine due to dizzy spells.   Dr. Aundra Dubin discussed with Dr. Lovena Le and arranged for PPM placement on the 14th.  Plan will be to remove IV port as well to decrease infection.   Pt presented on the 14th for pacer insertion.    Hospital Course     Consultants: none   Pt had IV port removed without complications and PPM placed ST Jude Dual chamber device.  She did well with procedure.    Today 03/18/18 her device has been interrogated and numbers are good.  Her CXR is without pneumothorax.  Lungs clear.  Leads stable.  She has been seen and evaluated by Dr. Rayann Heman and found stable for discharge.    Pressure dressing removed and ecchymosis down Lt arm no pain at site.  No hematoma, IV port removal site with spot of old blood on dressing.  Her Plavix was held one day.  Will resume per Dr. Jackalyn Lombard recommendations- her last stent was 05/2017 so would like to resume at least until August,.  Her RN is with her.   She is mostly atrial pacing but does have AV pacing.    Will hold plavix until wound  check .  _____________  Discharge Vitals Blood pressure (!) 146/55, pulse 64, temperature 98.3 F (36.8 C), temperature source Oral, resp. rate 20, height 4\' 10"  (1.473 m), weight 121 lb 0.5 oz (54.9 kg), SpO2 100 %.  Filed Weights   03/17/18 1056 03/18/18 0350  Weight: 102 lb (46.3 kg) 121 lb 0.5 oz (54.9 kg)   General:Pleasant affect, NAD Skin:Warm and dry, brisk capillary refill HEENT:normocephalic, sclera clear, mucus membranes moist Neck:supple, no JVD Heart:S1S2 RRR with aortic murmur, no gallup, rub or click Lungs:clear without rales, rhonchi, or wheezes WNI:OEVO, non tender, + BS, do not palpate liver spleen or masses Ext:no lower ext edema, 2+ pedal pulses, 2+ radial  pulses Neuro:alert and oriented X 3, MAE, follows commands, + facial symmetry  Labs & Radiologic Studies    CBC Recent Labs    03/16/18 1229  WBC 7.3  HGB 10.9*  HCT 36.0  MCV 90.7  PLT 350   Basic Metabolic Panel Recent Labs    03/16/18 1229  NA 134*  K 5.0  CL 104  CO2 22  GLUCOSE 92  BUN 33*  CREATININE 0.96  CALCIUM 9.4   Liver Function Tests No results for input(s): AST, ALT, ALKPHOS, BILITOT, PROT, ALBUMIN in the last 72 hours. No results for input(s): LIPASE, AMYLASE in the last 72 hours. Cardiac Enzymes No results for input(s): CKTOTAL, CKMB, CKMBINDEX, TROPONINI in the last 72 hours. BNP Invalid input(s): POCBNP D-Dimer No results for input(s): DDIMER in the last 72 hours. Hemoglobin A1C No results for input(s): HGBA1C in the last 72 hours. Fasting Lipid Panel No results for input(s): CHOL, HDL, LDLCALC, TRIG, CHOLHDL, LDLDIRECT in the last 72 hours. Thyroid Function Tests No results for input(s): TSH, T4TOTAL, T3FREE, THYROIDAB in the last 72 hours.  Invalid input(s): FREET3 _____________  Dg Chest 2 View  Result Date: 03/18/2018 CLINICAL DATA:  Pacemaker placement. EXAM: CHEST - 2 VIEW COMPARISON:  CT chest and chest radiograph 05/06/2017. FINDINGS: Trachea is midline. Heart size normal. Thoracic aorta is calcified. Pacemaker lead tips are seen from a left subclavian approach with lead tips in the right atrium and right ventricle. Lungs are clear. No pleural fluid. No pneumothorax. IMPRESSION: 1. Pacemaker placement without complicating feature. 2.  Aortic atherosclerosis (ICD10-170.0). Electronically Signed   By: Lorin Picket M.D.   On: 03/18/2018 07:31   Disposition   Pt is being discharged home today in good condition.  Follow-up Plans & Appointments   Heart Healthy Diet  Pacer instructions.  Follow-up Information    [facility] Follow up on 03/28/2018.   Specialty:  Cardiology Why:  1:30PM, wound check visit Contact  information: [address], [address] [place]       Evans Lance, MD Follow up on 06/21/2018.   Specialty:  Cardiology Why:  9:00AM Contact information: [address] [place] [address] [phone]            Discharge Medications   Allergies as of 03/18/2018      Reactions   Propranolol Other (See Comments)   Low blood pressure   Brexpiprazole Other (See Comments)   Aphasia and catatonia      Medication List    STOP taking these medications   clopidogrel 75 MG tablet Commonly known as:  PLAVIX     TAKE these medications   acetaminophen 500 MG tablet Commonly known as:  TYLENOL Take 1,000 mg by mouth every 6 (six)  hours as needed for mild pain or headache.   amLODipine 5 MG tablet Commonly known as:  NORVASC Take 0.5 tablets (2.5 mg total) by mouth daily.   aspirin 81 MG tablet Take 81 mg by mouth at bedtime.   CALCIUM 600/VITAMIN D PO Take 1 tablet by mouth 2 (two) times daily.   cetirizine 10 MG tablet Commonly known as:  ZYRTEC Take 10 mg by mouth daily as needed for allergies.   irbesartan 150 MG tablet Commonly known as:  AVAPRO TAKE 1 TABLET BY MOUTH DAILY What changed:    how much to take  how to take this  when to take this   lactulose 10 GM/15ML solution Commonly known as:  CHRONULAC TAKE 15 ML BY MOUTH DAILY AS NEEDED FOR MILD CONSTIPATION   Lavender Oil 80 MG Caps Take 160 mg by mouth at bedtime.   levothyroxine 75 MCG tablet Commonly known as:  SYNTHROID, LEVOTHROID TAKE 1 TABLET(75 MCG) BY MOUTH DAILY   lithium carbonate 150 MG capsule Take 300-450 mg by mouth See admin instructions. Take 300 mg by mouth every other night alternating with 450 mg by mouth every other night   LORazepam 0.5 MG tablet Commonly known as:  ATIVAN Take 0.25-0.5 mg by mouth See admin instructions. Take 0.25 mg by mouth at bedtime. Take 0.25-0.5 mg by mouth every 6 hours as needed  for anxiety   memantine 10 MG tablet Commonly known as:  NAMENDA Take 10 mg by mouth 2 (two) times daily.   mirtazapine 7.5 MG tablet Commonly known as:  REMERON Take 7.5 mg by mouth at bedtime.   multivitamin with minerals tablet Take 1 tablet by mouth daily.   nitroGLYCERIN 0.4 MG SL tablet Commonly known as:  NITROSTAT Place 1 tablet (0.4 mg total) under the tongue every 5 (five) minutes as needed for chest pain.   nortriptyline 25 MG capsule Commonly known as:  PAMELOR Take 75 mg by mouth at bedtime.   OMEGA 3 PO Take 1 capsule by mouth at bedtime.   omeprazole 40 MG capsule Commonly known as:  PRILOSEC Take 1 capsule (40 mg total) by mouth daily before supper.   OVER THE COUNTER MEDICATION Take 1 tablet by mouth 2 (two) times daily. *Cocavia*   PARoxetine 20 MG tablet Commonly known as:  PAXIL Take 20 mg by mouth at bedtime.   primidone 50 MG tablet Commonly known as:  MYSOLINE Take 1-2 tablets (50-100 mg total) by mouth See admin instructions. Take 100 mg by mouth in the morning and take 50 mg by mouth at bedtime What changed:    how much to take  how to take this  when to take this  additional instructions   ranitidine 150 MG tablet Commonly known as:  ZANTAC TAKE 1 TABLET(150 MG) BY MOUTH TWICE DAILY   rosuvastatin 40 MG tablet Commonly known as:  CRESTOR Take 1 tablet (40 mg total) by mouth daily.   sennosides-docusate sodium 8.6-50 MG tablet Commonly known as:  SENOKOT-S Take 1 tablet by mouth at bedtime.   TOVIAZ 8 MG Tb24 tablet Generic drug:  fesoterodine TAKE 1 TABLET BY MOUTH DAILY   vitamin B-12 1000 MCG tablet Commonly known as:  CYANOCOBALAMIN Take 1,000 mcg by mouth at bedtime.   vitamin B-6 500 MG tablet Take 500 mg by mouth 2 (two) times daily.   Vitamin D-3 5000 units Tabs Take 5,000 Units by mouth daily with breakfast.        Acute coronary syndrome (  MI, NSTEMI, STEMI, etc) this admission?: No.    Outstanding  Labs/Studies   none  Duration of Discharge Encounter   Greater than 30 minutes including physician time.  Signed, Huel Coventry, NP 03/18/2018, 10:04 AM   I have seen, examined the patient, and reviewed the above assessment and plan.  Changes to above are made where necessary.  On exam, RRR.  CXR reveals stable leads, no ptx.  Device interrogation is personally reviewed and normal.  DC to home Routine wound care and follow-up Hold plavix until wound check per Dr Lovena Le  Co Sign: Thompson Grayer, MD 03/18/2018 10:07 AM ]

## 2018-03-20 ENCOUNTER — Telehealth: Payer: Self-pay | Admitting: Internal Medicine

## 2018-03-20 ENCOUNTER — Encounter (HOSPITAL_COMMUNITY): Payer: Self-pay | Admitting: Internal Medicine

## 2018-03-20 NOTE — Telephone Encounter (Signed)
Left message to call back  

## 2018-03-20 NOTE — Telephone Encounter (Signed)
New Message   [facility] patients caregiver is calling to request that orders to be faxed over to [facility]. Attention Pollie Friar fax number [phone]. The order is for her to be able to do leg and back exercises.  (Physical therapy). Please call to discuss.

## 2018-03-21 NOTE — Telephone Encounter (Signed)
Call returned to [facility].  Clarified what was needed to be sent to [facility] for Pt to be able to continue leg exercises. Letter created with Pt's current medical restrictions and faxed as requested.

## 2018-03-22 DIAGNOSIS — M6281 Muscle weakness (generalized): Secondary | ICD-10-CM | POA: Diagnosis not present

## 2018-03-22 DIAGNOSIS — M79661 Pain in right lower leg: Secondary | ICD-10-CM | POA: Diagnosis not present

## 2018-03-22 DIAGNOSIS — R269 Unspecified abnormalities of gait and mobility: Secondary | ICD-10-CM | POA: Diagnosis not present

## 2018-03-27 DIAGNOSIS — M6281 Muscle weakness (generalized): Secondary | ICD-10-CM | POA: Diagnosis not present

## 2018-03-27 DIAGNOSIS — M79661 Pain in right lower leg: Secondary | ICD-10-CM | POA: Diagnosis not present

## 2018-03-27 DIAGNOSIS — R269 Unspecified abnormalities of gait and mobility: Secondary | ICD-10-CM | POA: Diagnosis not present

## 2018-03-28 ENCOUNTER — Ambulatory Visit (INDEPENDENT_AMBULATORY_CARE_PROVIDER_SITE_OTHER): Payer: Medicare Other | Admitting: *Deleted

## 2018-03-28 DIAGNOSIS — Z950 Presence of cardiac pacemaker: Secondary | ICD-10-CM

## 2018-03-28 DIAGNOSIS — I495 Sick sinus syndrome: Secondary | ICD-10-CM

## 2018-03-28 LAB — CUP PACEART INCLINIC DEVICE CHECK
Brady Statistic RA Percent Paced: 89 %
Brady Statistic RV Percent Paced: 0.06 %
Date Time Interrogation Session: 20190625145936
Implantable Lead Implant Date: 20190614
Implantable Lead Location: 753860
Implantable Pulse Generator Implant Date: 20190614
Lead Channel Impedance Value: 550 Ohm
Lead Channel Pacing Threshold Amplitude: 0.5 V
Lead Channel Pacing Threshold Amplitude: 0.5 V
Lead Channel Pacing Threshold Pulse Width: 0.5 ms
Lead Channel Sensing Intrinsic Amplitude: 10.1 mV
MDC IDC LEAD IMPLANT DT: 20190614
MDC IDC LEAD LOCATION: 753859
MDC IDC MSMT BATTERY REMAINING LONGEVITY: 86 mo
MDC IDC MSMT BATTERY VOLTAGE: 3.08 V
MDC IDC MSMT LEADCHNL RA IMPEDANCE VALUE: 512.5 Ohm
MDC IDC MSMT LEADCHNL RA SENSING INTR AMPL: 2 mV
MDC IDC MSMT LEADCHNL RV PACING THRESHOLD PULSEWIDTH: 0.5 ms
MDC IDC PG SERIAL: 9031934
MDC IDC SET LEADCHNL RA PACING AMPLITUDE: 3.5 V
MDC IDC SET LEADCHNL RV PACING AMPLITUDE: 0.75 V
MDC IDC SET LEADCHNL RV PACING PULSEWIDTH: 0.5 ms
MDC IDC SET LEADCHNL RV SENSING SENSITIVITY: 2 mV
Pulse Gen Model: 2272

## 2018-03-28 NOTE — Progress Notes (Signed)
Wound check appointment. Steri-strips removed. Wound without redness or edema. Incision edges approximated, wound well healed. Healing ecchmosis noted at device site and extending down left arm, much improved since hospital d/c per [facility] RN accompanying patient. Normal device function. Thresholds, sensing, and impedances consistent with implant measurements. Device programmed at 3.5V (RA only) with auto capture programmed on (RV only) for extra safety margin until 3 month visit. Histogram distribution appropriate for patient and level of activity. No mode switches or high ventricular rates noted. Patient educated about wound care, arm mobility, lifting restrictions, and Merlin monitor. ROV with [facility] on 06/21/18.  Steri-strips also removed from right chest porta-cath removal site. Incision edges approximated, wound healing well. Patient's nurse is aware to call our office if any signs/symptoms of infection or bleeding are noted at either site.

## 2018-03-29 ENCOUNTER — Telehealth: Payer: Self-pay

## 2018-03-29 DIAGNOSIS — M6281 Muscle weakness (generalized): Secondary | ICD-10-CM | POA: Diagnosis not present

## 2018-03-29 DIAGNOSIS — M79661 Pain in right lower leg: Secondary | ICD-10-CM | POA: Diagnosis not present

## 2018-03-29 DIAGNOSIS — R269 Unspecified abnormalities of gait and mobility: Secondary | ICD-10-CM | POA: Diagnosis not present

## 2018-03-29 NOTE — Telephone Encounter (Signed)
-----   Message from Mechele Dawley, RN sent at 03/28/2018  3:53 PM EDT ----- Regarding: Plavix clarification Dr. Lovena Le--  Mrs. Leanora Cover nurse wanted to clarify if she should continue taking Plavix.  She has started back on it already, but they wanted to double-check with you.  Site is healing well, ecchymosis noted in various stages of healing at site and extending down left arm.  Thanks, Raquel Sarna

## 2018-03-29 NOTE — Telephone Encounter (Signed)
Advised per review of discharge summary Pt to restart Plavix at wound check.  Will confirm with Dr. Lovena Le and return call.

## 2018-03-30 NOTE — Telephone Encounter (Signed)
Per Dr. Dorathy Daft Plavix if wound looks good.  [name] notified. Plavix has been restarted.

## 2018-04-03 ENCOUNTER — Encounter (HOSPITAL_COMMUNITY): Payer: Self-pay

## 2018-04-03 ENCOUNTER — Telehealth (HOSPITAL_COMMUNITY): Payer: Self-pay

## 2018-04-03 NOTE — Telephone Encounter (Signed)
Notes recorded by Shirley Muscat, RN on 04/03/2018 at 12:31 PM EDT I have been unable to reach this patient by phone. A letter is being sent.  ------  Notes recorded by Shirley Muscat, RN on 03/23/2018 at 2:12 PM EDT Left message to call back   ------  Notes recorded by Shirley Muscat, RN on 03/21/2018 at 11:32 AM EDT Left message to call back   ------  Notes recorded by Larey Dresser, MD on 03/17/2018 at 8:57 AM EDT Normal EF, mild to moderate AI

## 2018-04-04 ENCOUNTER — Encounter (HOSPITAL_COMMUNITY): Payer: Self-pay | Admitting: Internal Medicine

## 2018-04-05 DIAGNOSIS — R269 Unspecified abnormalities of gait and mobility: Secondary | ICD-10-CM | POA: Diagnosis not present

## 2018-04-05 DIAGNOSIS — M6281 Muscle weakness (generalized): Secondary | ICD-10-CM | POA: Diagnosis not present

## 2018-04-05 DIAGNOSIS — M79661 Pain in right lower leg: Secondary | ICD-10-CM | POA: Diagnosis not present

## 2018-04-11 ENCOUNTER — Ambulatory Visit (INDEPENDENT_AMBULATORY_CARE_PROVIDER_SITE_OTHER): Payer: Medicare Other | Admitting: Internal Medicine

## 2018-04-11 ENCOUNTER — Encounter: Payer: Self-pay | Admitting: Internal Medicine

## 2018-04-11 VITALS — BP 140/60 | HR 63 | Ht 59.0 in | Wt 100.0 lb

## 2018-04-11 DIAGNOSIS — E875 Hyperkalemia: Secondary | ICD-10-CM

## 2018-04-11 DIAGNOSIS — G4730 Sleep apnea, unspecified: Secondary | ICD-10-CM

## 2018-04-11 DIAGNOSIS — Z0000 Encounter for general adult medical examination without abnormal findings: Secondary | ICD-10-CM | POA: Diagnosis not present

## 2018-04-11 DIAGNOSIS — R413 Other amnesia: Secondary | ICD-10-CM

## 2018-04-11 DIAGNOSIS — M6281 Muscle weakness (generalized): Secondary | ICD-10-CM | POA: Diagnosis not present

## 2018-04-11 DIAGNOSIS — R8290 Unspecified abnormal findings in urine: Secondary | ICD-10-CM

## 2018-04-11 DIAGNOSIS — E7800 Pure hypercholesterolemia, unspecified: Secondary | ICD-10-CM

## 2018-04-11 DIAGNOSIS — K5909 Other constipation: Secondary | ICD-10-CM

## 2018-04-11 DIAGNOSIS — I779 Disorder of arteries and arterioles, unspecified: Secondary | ICD-10-CM | POA: Diagnosis not present

## 2018-04-11 DIAGNOSIS — M79661 Pain in right lower leg: Secondary | ICD-10-CM | POA: Diagnosis not present

## 2018-04-11 DIAGNOSIS — I200 Unstable angina: Secondary | ICD-10-CM

## 2018-04-11 DIAGNOSIS — R269 Unspecified abnormalities of gait and mobility: Secondary | ICD-10-CM | POA: Diagnosis not present

## 2018-04-11 DIAGNOSIS — I10 Essential (primary) hypertension: Secondary | ICD-10-CM | POA: Diagnosis not present

## 2018-04-11 DIAGNOSIS — R7989 Other specified abnormal findings of blood chemistry: Secondary | ICD-10-CM

## 2018-04-11 DIAGNOSIS — M810 Age-related osteoporosis without current pathological fracture: Secondary | ICD-10-CM | POA: Diagnosis not present

## 2018-04-11 DIAGNOSIS — D649 Anemia, unspecified: Secondary | ICD-10-CM

## 2018-04-11 DIAGNOSIS — Z9882 Breast implant status: Secondary | ICD-10-CM | POA: Diagnosis not present

## 2018-04-11 DIAGNOSIS — F310 Bipolar disorder, current episode hypomanic: Secondary | ICD-10-CM | POA: Diagnosis not present

## 2018-04-11 DIAGNOSIS — Z950 Presence of cardiac pacemaker: Secondary | ICD-10-CM

## 2018-04-11 DIAGNOSIS — R251 Tremor, unspecified: Secondary | ICD-10-CM

## 2018-04-11 DIAGNOSIS — K219 Gastro-esophageal reflux disease without esophagitis: Secondary | ICD-10-CM

## 2018-04-11 DIAGNOSIS — I739 Peripheral vascular disease, unspecified: Secondary | ICD-10-CM

## 2018-04-11 LAB — POCT URINALYSIS DIPSTICK
Blood, UA: NEGATIVE
GLUCOSE UA: NEGATIVE
KETONES UA: NEGATIVE
NITRITE UA: NEGATIVE
PROTEIN UA: NEGATIVE
Spec Grav, UA: 1.015 (ref 1.010–1.025)
Urobilinogen, UA: 0.2 E.U./dL
pH, UA: 6 (ref 5.0–8.0)

## 2018-04-12 LAB — CBC WITH DIFFERENTIAL/PLATELET
BASOS ABS: 18 {cells}/uL (ref 0–200)
Basophils Relative: 0.3 %
EOS PCT: 1.5 %
Eosinophils Absolute: 89 cells/uL (ref 15–500)
HCT: 36.4 % (ref 35.0–45.0)
Hemoglobin: 11.9 g/dL (ref 11.7–15.5)
Lymphs Abs: 1475 cells/uL (ref 850–3900)
MCH: 29.2 pg (ref 27.0–33.0)
MCHC: 32.7 g/dL (ref 32.0–36.0)
MCV: 89.4 fL (ref 80.0–100.0)
MONOS PCT: 8.1 %
MPV: 10.1 fL (ref 7.5–12.5)
NEUTROS PCT: 65.1 %
Neutro Abs: 3841 cells/uL (ref 1500–7800)
PLATELETS: 229 10*3/uL (ref 140–400)
RBC: 4.07 10*6/uL (ref 3.80–5.10)
RDW: 14.8 % (ref 11.0–15.0)
TOTAL LYMPHOCYTE: 25 %
WBC mixed population: 478 cells/uL (ref 200–950)
WBC: 5.9 10*3/uL (ref 3.8–10.8)

## 2018-04-12 LAB — COMPLETE METABOLIC PANEL WITH GFR
AG Ratio: 1.7 (calc) (ref 1.0–2.5)
ALBUMIN MSPROF: 4.5 g/dL (ref 3.6–5.1)
ALKALINE PHOSPHATASE (APISO): 65 U/L (ref 33–130)
ALT: 75 U/L — AB (ref 6–29)
AST: 52 U/L — AB (ref 10–35)
BILIRUBIN TOTAL: 0.3 mg/dL (ref 0.2–1.2)
BUN / CREAT RATIO: 35 (calc) — AB (ref 6–22)
BUN: 34 mg/dL — AB (ref 7–25)
CHLORIDE: 101 mmol/L (ref 98–110)
CO2: 25 mmol/L (ref 20–32)
Calcium: 9.9 mg/dL (ref 8.6–10.4)
Creat: 0.98 mg/dL — ABNORMAL HIGH (ref 0.60–0.93)
GFR, Est African American: 64 mL/min/{1.73_m2} (ref >=60)
GFR, Est Non African American: 55 mL/min/{1.73_m2} — ABNORMAL LOW (ref >=60)
GLUCOSE: 88 mg/dL (ref 65–99)
Globulin: 2.6 g/dL (calc) (ref 1.9–3.7)
Potassium: 5 mmol/L (ref 3.5–5.3)
Sodium: 133 mmol/L — ABNORMAL LOW (ref 135–146)
Total Protein: 7.1 g/dL (ref 6.1–8.1)

## 2018-04-12 LAB — IRON,TIBC AND FERRITIN PANEL
%SAT: 9 % — AB (ref 16–45)
Ferritin: 23 ng/mL (ref 16–288)
IRON: 35 ug/dL — AB (ref 45–160)
TIBC: 390 mcg/dL (calc) (ref 250–450)

## 2018-04-12 LAB — LITHIUM LEVEL: Lithium Lvl: 0.6 mmol/L (ref 0.6–1.2)

## 2018-04-12 LAB — TSH: TSH: 1.91 m[IU]/L (ref 0.40–4.50)

## 2018-04-13 DIAGNOSIS — M6281 Muscle weakness (generalized): Secondary | ICD-10-CM | POA: Diagnosis not present

## 2018-04-13 DIAGNOSIS — M79661 Pain in right lower leg: Secondary | ICD-10-CM | POA: Diagnosis not present

## 2018-04-13 DIAGNOSIS — R269 Unspecified abnormalities of gait and mobility: Secondary | ICD-10-CM | POA: Diagnosis not present

## 2018-04-13 LAB — URINE CULTURE
MICRO NUMBER: 90811132
SPECIMEN QUALITY: ADEQUATE

## 2018-04-14 ENCOUNTER — Telehealth: Payer: Self-pay | Admitting: Internal Medicine

## 2018-04-14 NOTE — Telephone Encounter (Signed)
Faxed lab results to Dr Clovis Pu

## 2018-04-19 DIAGNOSIS — F3170 Bipolar disorder, currently in remission, most recent episode unspecified: Secondary | ICD-10-CM | POA: Diagnosis not present

## 2018-04-26 DIAGNOSIS — R269 Unspecified abnormalities of gait and mobility: Secondary | ICD-10-CM | POA: Diagnosis not present

## 2018-04-26 DIAGNOSIS — M79661 Pain in right lower leg: Secondary | ICD-10-CM | POA: Diagnosis not present

## 2018-04-26 DIAGNOSIS — M6281 Muscle weakness (generalized): Secondary | ICD-10-CM | POA: Diagnosis not present

## 2018-04-27 ENCOUNTER — Other Ambulatory Visit: Payer: Self-pay | Admitting: Cardiology

## 2018-04-27 DIAGNOSIS — I6523 Occlusion and stenosis of bilateral carotid arteries: Secondary | ICD-10-CM

## 2018-05-01 DIAGNOSIS — M6281 Muscle weakness (generalized): Secondary | ICD-10-CM | POA: Diagnosis not present

## 2018-05-01 DIAGNOSIS — R269 Unspecified abnormalities of gait and mobility: Secondary | ICD-10-CM | POA: Diagnosis not present

## 2018-05-01 DIAGNOSIS — M79661 Pain in right lower leg: Secondary | ICD-10-CM | POA: Diagnosis not present

## 2018-05-02 ENCOUNTER — Encounter: Payer: Self-pay | Admitting: Women's Health

## 2018-05-02 ENCOUNTER — Ambulatory Visit (INDEPENDENT_AMBULATORY_CARE_PROVIDER_SITE_OTHER): Payer: Medicare Other | Admitting: Women's Health

## 2018-05-02 VITALS — BP 142/82 | Ht 59.0 in | Wt 103.4 lb

## 2018-05-02 DIAGNOSIS — M810 Age-related osteoporosis without current pathological fracture: Secondary | ICD-10-CM | POA: Diagnosis not present

## 2018-05-02 DIAGNOSIS — I200 Unstable angina: Secondary | ICD-10-CM

## 2018-05-02 DIAGNOSIS — Z780 Asymptomatic menopausal state: Secondary | ICD-10-CM

## 2018-05-02 DIAGNOSIS — M8580 Other specified disorders of bone density and structure, unspecified site: Secondary | ICD-10-CM

## 2018-05-02 LAB — BUN: BUN: 32 mg/dL — ABNORMAL HIGH (ref 7–25)

## 2018-05-02 LAB — CALCIUM: CALCIUM: 9 mg/dL (ref 8.6–10.4)

## 2018-05-02 LAB — CREATININE, SERUM: CREATININE: 0.96 mg/dL — AB (ref 0.60–0.93)

## 2018-05-02 NOTE — Patient Instructions (Signed)
Osteoporosis Osteoporosis happens when your bones become thinner and weaker. Weak bones can break (fracture) more easily when you slip or fall. Bones most at risk of breaking are in the hip, wrist, and spine. Follow these instructions at home:  Get enough calcium and vitamin D. These nutrients are good for your bones.  Exercise as told by your doctor.  Do not use any tobacco products. This includes cigarettes, chewing tobacco, and electronic cigarettes. If you need help quitting, ask your doctor.  Limit the amount of alcohol you drink.  Take medicines only as told by your doctor.  Keep all follow-up visits as told by your doctor. This is important.  Take care at home to prevent falls. Some ways to do this are: ? Keep rooms well lit and tidy. ? Put safety rails on your stairs. ? Put a rubber mat in the bathroom and other places that are often wet or slippery. Get help right away if:  You fall.  You hurt yourself. This information is not intended to replace advice given to you by your health care provider. Make sure you discuss any questions you have with your health care provider. Document Released: 12/13/2011 Document Revised: 02/26/2016 Document Reviewed: 02/28/2014 Elsevier Interactive Patient Education  2018 Elsevier Inc.  

## 2018-05-02 NOTE — Progress Notes (Signed)
Meghan Oliver November 06, 1940 [id number]    History:    Presents for breast and pelvic exam.  Normal Pap and mammogram history.  Primary care manages hypertension, migraines, anxiety/ depression, coronary artery disease had a stent placed 05/2017.  03/2018 pacemaker placed.  2018 T score -2 hip femoral neck FRAX 25% / 16% received 2 doses of Prolia, has not been able to receive Reclast due to elevated creatinine.  Normal calcium level. Vaccines current.  Continues with ECT treatments at [facility].  Doing much better since pacemaker placed with energy, steady gait and enjoying life.  Doing cardiac rehab as well as personal training with yoga and weights at home most days of the week.  Not sexually active.  Past medical history, past surgical history, family history and social history were all reviewed and documented in the EPIC chart.  Lives half of the year in [place].  Has personal help at home.  2 daughters one lives in [place] one in [place].  ROS:  A ROS was performed and pertinent positives and negatives are included.  Exam:  Vitals:   05/02/18 1513  BP: (!) 142/82  Weight: 103 lb 6.4 oz (46.9 kg)   Body mass index is 20.88 kg/m.   General appearance:  Normal Thyroid:  Symmetrical, normal in size, without palpable masses or nodularity. Respiratory  Auscultation:  Clear without wheezing or rhonchi Cardiovascular  Auscultation:  Regular rate, without rubs, murmurs or gallops  Edema/varicosities:  Not grossly evident Abdominal  Soft,nontender, without masses, guarding or rebound.  Liver/spleen:  No organomegaly noted  Hernia:  None appreciated  Skin  Inspection:  Grossly normal   Breasts: Examined lying and sitting. Bilateral implants    Right: Without masses, retractions, discharge or axillary adenopathy.     Left: Without masses, retractions, discharge or axillary adenopathy. Gentitourinary   Inguinal/mons:  Normal without inguinal adenopathy  External genitalia:   Normal  BUS/Urethra/Skene's glands:  Normal  Vagina:  Normal  Cervix:  Normal  Uterus:   normal in size, shape and contour.  Midline and mobile  Adnexa/parametria:     Rt: Without masses or tenderness.   Lt: Without masses or tenderness.  Anus and perineum: Normal  Digital rectal exam: Normal sphincter tone without palpated masses or tenderness  Assessment/Plan:  77 y.o. MWF G2, P2 for breast and pelvic exam.  Postmenopausal no HRT with no bleeding Osteopenia with elevated FRAX Anxiety/depression managed at [facility] 03/2018 pacemaker doing much better since placed Hypertension, migraines, anxiety and depression primary care manages labs and meds  plan: Will recheck creatinine and calcium, if both normal will proceed with reclast.  Reviewed importance of continuing with exercise routine, yoga and weights.  Home safety, fall prevention discussed.  (gait  steady when walking).  SBEs, continue annual screening mammogram due in November.   [place], 3:28 PM 05/02/2018

## 2018-05-03 ENCOUNTER — Telehealth: Payer: Self-pay | Admitting: *Deleted

## 2018-05-03 ENCOUNTER — Encounter: Payer: Self-pay | Admitting: Internal Medicine

## 2018-05-03 MED ORDER — ALENDRONATE SODIUM 70 MG PO TABS: 70.0000 mg | ORAL_TABLET | ORAL | 0 refills | Status: DC

## 2018-05-03 NOTE — Telephone Encounter (Signed)
D/C reclast start fosamax

## 2018-05-03 NOTE — Telephone Encounter (Signed)
Huel Cote, NP sent to Enrigue Catena, RMA        please call and review creat still sl elevated, may be best to try fosamax monthly take 1 time per week with fulll glass of water and not lie down for atleast 30 min after taking. Creat is sl elevated but best not to take reclast plus gait is much better, continue exercise. Fall risk less this year. Mildly elevated which primary care aware.    Spoke with patient Nurse, pt aware of instructions and recommendations.   Called into pharmacy

## 2018-05-03 NOTE — Patient Instructions (Signed)
It was a pleasure to see you today.  Continue same medications.

## 2018-05-03 NOTE — Progress Notes (Signed)
Subjective:    Patient ID: Meghan Oliver, female    DOB: August 21, 1941, 77 y.o.   MRN: [id number]  HPI 77 year old Female in today for Medicare wellness, health maintenance exam and evaluation of medical issues.  She is accompanied by her caretakers, [name] [phone] and Bary Leriche.  Her graph [name] tells me that Clair Gulling is doing very well.  She continues to get ECT at [facility].  In April while they were [place] in [place] she had an intramuscular Bleed in over 800 cc had to be removed from her cath.  She is to see GYN, [place] practitioner in about a week.  She has memory issues.  She has had to have a pacemaker implanted.  She developed a partial small bowel obstruction in [place] and was severely constipated. Dr. Silverio Decamp is Gastroenterologist  Continues to see [name].  Elon Alas, NP see her for GYN care. She takes Fosamax for osteoporosis.  She takes omeprazole for GE reflux.  She is now on Plavix started August 2018 after an LAD stent.  It was restarted June 2019.  She remains on Synthroid.  She continues on lithium, mirtazapine, nortriptyline, Paxil.  She is also on Crestor and enteric-coated aspirin.  Dr. Barbie Banner, psychiatrist, in [place] has her on [place].  His phone number is [phone]. she also is on lorazepam for [name].  She takes lactulose for constipation.  Dr. Algernon Huxley has her on irbesartan and amlodipine.   She has been on Linzess per GI physician but that is being held.   Orthopedist is Dr. Noemi Chapel.  She has a history of bipolar 2 illness, hypertension, GE reflux, urinary frequency, hyperlipidemia and hypothyroidism.  History of left lower lobe pneumonia 2015.  Appendectomy 1971.  History of bilateral tubal ligation and left ovarian cystectomy.  History of bilateral breast implants.   long-standing history of depression starting in her 26s after the birth of her first child.  History of obstructive sleep apnea diagnosed  by Dr. Livingston Diones.  History of carotid disease which is monitored by cardiology.  History of lumbar disc disease.  Sees Dr. Carles Collet for tremor.  She has been treated with primidone  History of osteoporosis.  Family history: Apparently her only sibling, a brother has dementia.  Father with history of bipolar disorder died at age 58 of an MI.  Mother died with history of COPD.  Social history: She is married for over 75 years to a retired Copywriter, advertising.  Does not smoke.  Quit smoking in 1972.  2 adult daughters, one lives in [place] and one lives in [place].   Review of Systems no new complaints     Objective:   Physical Exam Her affect is bright.  She is pleasant and cooperative    Skin warm and dry.  Nodes none.  Neck is supple.  Chest clear.  Cardiac exam regular rate and rhythm breasts bilateral implants.  She has a 2/6 systolic ejection murmur transmitted to carotids bilaterally.  Abdomen no hepatosplenomegaly masses or tenderness.  GYN exam deferred to gynecologist.  No lower extremity edema.  She has hand tremor.  Memory deficits noted.  Affect is normal.  Depression is markedly improved.       Assessment & Plan:  Memory loss-being monitored by psychiatrists  Bilateral hand tremor-on primidone per Dr. Carles Collet  Obstructive sleep apnea  GE reflux treated with PPI  History of bilateral breast implants  Chronic constipation currently on lactulose  Osteoporosis  Lumbar  disc disease  Hyperlipidemia  Pacemaker  Essential hypertension stable on current regimen  Bilateral carotid disease being monitored by cardiology  Hypothyroidism stable on current regimen of thyroid replacement  Plan: She generally comes yearly unless something else is needed.  She has multiple physicians is stable at the present time.  Her iron level is low at 35 but her hemoglobin has improved from 10.9-11.9.  Lithium level will be faxed to [place].  Subjective:   Patient presents for  Medicare Annual/Subsequent preventive examination.  Review Past Medical/Family/Social:   Risk Factors  Current exercise habits:  Dietary issues discussed:   Cardiac risk factors: Coronary disease, hyperlipidemia, family history  Depression Screen  (Note: if answer to either of the following is "Yes", a more complete depression screening is indicated)   Over the past two weeks, have you felt down, depressed or hopeless? No  Over the past two weeks, have you felt little interest or pleasure in doing things? No Have you lost interest or pleasure in daily life? No Do you often feel hopeless? No Do you cry easily over simple problems? No   Activities of Daily Living  In your present state of health, do you have any difficulty performing the following activities?:   Driving?  does not drive because of memory issues Managing money?  does not handle finances Feeding yourself? No  Getting from bed to chair? No  Climbing a flight of stairs? No  Preparing food and eating?: No  Bathing or showering? No  Getting dressed: No  Getting to the toilet? No  Using the toilet:No  Moving around from place to place: No  In the past year have you fallen or had a near fall?:  Yes Are you sexually active?  not asked Do you have more than one partner? No   Hearing Difficulties: No  Do you often ask people to speak up or repeat themselves? No  Do you experience ringing or noises in your ears? No  Do you have difficulty understanding soft or whispered voices? No  Do you feel that you have a problem with memory? yes Do you often misplace items?  yes   Home Safety:  Do you have a smoke alarm at your residence? Yes Do you have grab bars in the bathroom?  Yes Do you have throw rugs in your house?  Yes   Cognitive Testing  Alert? Yes Normal Appearance?Yes  Oriented to person? Yes Place? Yes  Time? Yes  Recall of three objects? Yes  Can perform simple calculations? Yes  Displays appropriate  judgment?Yes  Can read the correct time from a watch face?Yes   List the Names of Other Physician/Practitioners you currently use:  See referral list for the physicians patient is currently seeing.     Review of Systems: See above   Objective:     General appearance: Appears  younger than stated age Head: Normocephalic, without obvious abnormality, atraumatic  Eyes: conj clear, EOMi PEERLA  Ears: normal TM's and external ear canals both ears  Nose: Nares normal. Septum midline. Mucosa normal. No drainage or sinus tenderness.  Throat: lips, mucosa, and tongue normal; teeth and gums normal  Neck: no adenopathy, no carotid bruit, no JVD, supple, symmetrical, trachea midline and thyroid not enlarged, symmetric, no tenderness/mass/nodules  No CVA tenderness.  Lungs: clear to auscultation bilaterally  Breasts: normal appearance, no masses or tenderness Heart: regular rate and rhythm, S1, S2 normal, no murmur, click, rub or gallop  Abdomen: soft, non-tender; bowel sounds  normal; no masses, no organomegaly  Musculoskeletal: ROM normal in all joints, no crepitus, no deformity, Normal muscle strengthen. Back  is symmetric, no curvature. Skin: Skin color, texture, turgor normal. No rashes or lesions  Lymph nodes: Cervical, supraclavicular, and axillary nodes normal.  Neurologic: CN 2 -12 Normal, Normal symmetric reflexes. Normal coordination and gait  Psych: Alert & Oriented x 3, Mood appear stable.    Assessment:    Annual wellness medicare exam   Plan:    During the course of the visit the patient was educated and counseled about appropriate screening and preventive services including:   Annual mammogram  Annual flu vaccine     Patient Instructions (the written plan) was given to the patient.  Medicare Attestation  I have personally reviewed:  The patient's medical and social history  Their use of alcohol, tobacco or illicit drugs  Their current medications and supplements   The patient's functional ability including ADLs,fall risks, home safety risks, cognitive, and hearing and visual impairment  Diet and physical activities  Evidence for depression or mood disorders  The patient's weight, height, BMI, and visual acuity have been recorded in the chart. I have made referrals, counseling, and provided education to the patient based on review of the above and I have provided the patient with a written personalized care plan for preventive services.

## 2018-05-17 ENCOUNTER — Ambulatory Visit (HOSPITAL_COMMUNITY)
Admission: RE | Admit: 2018-05-17 | Discharge: 2018-05-17 | Disposition: A | Discharge status: Home / Self Care | Payer: Medicare Other | Source: Ambulatory Visit | Attending: Cardiology | Admitting: Cardiology

## 2018-05-17 DIAGNOSIS — I6523 Occlusion and stenosis of bilateral carotid arteries: Secondary | ICD-10-CM | POA: Insufficient documentation

## 2018-05-17 DIAGNOSIS — M6281 Muscle weakness (generalized): Secondary | ICD-10-CM | POA: Diagnosis not present

## 2018-05-17 DIAGNOSIS — M79661 Pain in right lower leg: Secondary | ICD-10-CM | POA: Diagnosis not present

## 2018-05-17 DIAGNOSIS — R269 Unspecified abnormalities of gait and mobility: Secondary | ICD-10-CM | POA: Diagnosis not present

## 2018-05-22 DIAGNOSIS — M6281 Muscle weakness (generalized): Secondary | ICD-10-CM | POA: Diagnosis not present

## 2018-05-22 DIAGNOSIS — M79661 Pain in right lower leg: Secondary | ICD-10-CM | POA: Diagnosis not present

## 2018-05-22 DIAGNOSIS — R269 Unspecified abnormalities of gait and mobility: Secondary | ICD-10-CM | POA: Diagnosis not present

## 2018-05-24 ENCOUNTER — Other Ambulatory Visit: Payer: Self-pay | Admitting: Internal Medicine

## 2018-05-24 DIAGNOSIS — Z86018 Personal history of other benign neoplasm: Secondary | ICD-10-CM | POA: Diagnosis not present

## 2018-05-24 DIAGNOSIS — D225 Melanocytic nevi of trunk: Secondary | ICD-10-CM | POA: Diagnosis not present

## 2018-05-24 DIAGNOSIS — L821 Other seborrheic keratosis: Secondary | ICD-10-CM | POA: Diagnosis not present

## 2018-05-24 DIAGNOSIS — D1801 Hemangioma of skin and subcutaneous tissue: Secondary | ICD-10-CM | POA: Diagnosis not present

## 2018-05-24 DIAGNOSIS — L814 Other melanin hyperpigmentation: Secondary | ICD-10-CM | POA: Diagnosis not present

## 2018-05-24 DIAGNOSIS — L720 Epidermal cyst: Secondary | ICD-10-CM | POA: Diagnosis not present

## 2018-05-26 DIAGNOSIS — F3341 Major depressive disorder, recurrent, in partial remission: Secondary | ICD-10-CM | POA: Diagnosis not present

## 2018-05-29 ENCOUNTER — Encounter (HOSPITAL_COMMUNITY): Payer: Self-pay | Admitting: Cardiology

## 2018-05-29 ENCOUNTER — Ambulatory Visit (HOSPITAL_COMMUNITY)
Admission: RE | Admit: 2018-05-29 | Discharge: 2018-05-29 | Disposition: A | Discharge status: Home / Self Care | Payer: Medicare Other | Source: Ambulatory Visit | Attending: Cardiology | Admitting: Cardiology

## 2018-05-29 ENCOUNTER — Other Ambulatory Visit: Payer: Self-pay

## 2018-05-29 VITALS — BP 121/56 | HR 59 | Wt 105.0 lb

## 2018-05-29 DIAGNOSIS — G4733 Obstructive sleep apnea (adult) (pediatric): Secondary | ICD-10-CM | POA: Insufficient documentation

## 2018-05-29 DIAGNOSIS — R001 Bradycardia, unspecified: Secondary | ICD-10-CM | POA: Insufficient documentation

## 2018-05-29 DIAGNOSIS — E785 Hyperlipidemia, unspecified: Secondary | ICD-10-CM | POA: Insufficient documentation

## 2018-05-29 DIAGNOSIS — I2510 Atherosclerotic heart disease of native coronary artery without angina pectoris: Secondary | ICD-10-CM | POA: Diagnosis not present

## 2018-05-29 DIAGNOSIS — Z7982 Long term (current) use of aspirin: Secondary | ICD-10-CM | POA: Diagnosis not present

## 2018-05-29 DIAGNOSIS — E782 Mixed hyperlipidemia: Secondary | ICD-10-CM | POA: Diagnosis not present

## 2018-05-29 DIAGNOSIS — I5032 Chronic diastolic (congestive) heart failure: Secondary | ICD-10-CM

## 2018-05-29 DIAGNOSIS — Z7902 Long term (current) use of antithrombotics/antiplatelets: Secondary | ICD-10-CM | POA: Diagnosis not present

## 2018-05-29 DIAGNOSIS — F0390 Unspecified dementia without behavioral disturbance: Secondary | ICD-10-CM | POA: Diagnosis not present

## 2018-05-29 DIAGNOSIS — F319 Bipolar disorder, unspecified: Secondary | ICD-10-CM | POA: Diagnosis not present

## 2018-05-29 DIAGNOSIS — I495 Sick sinus syndrome: Secondary | ICD-10-CM | POA: Diagnosis not present

## 2018-05-29 DIAGNOSIS — I351 Nonrheumatic aortic (valve) insufficiency: Secondary | ICD-10-CM | POA: Diagnosis not present

## 2018-05-29 DIAGNOSIS — R918 Other nonspecific abnormal finding of lung field: Secondary | ICD-10-CM | POA: Diagnosis not present

## 2018-05-29 DIAGNOSIS — I6523 Occlusion and stenosis of bilateral carotid arteries: Secondary | ICD-10-CM | POA: Insufficient documentation

## 2018-05-29 DIAGNOSIS — K219 Gastro-esophageal reflux disease without esophagitis: Secondary | ICD-10-CM | POA: Diagnosis not present

## 2018-05-29 DIAGNOSIS — Z79890 Hormone replacement therapy (postmenopausal): Secondary | ICD-10-CM | POA: Insufficient documentation

## 2018-05-29 DIAGNOSIS — F3170 Bipolar disorder, currently in remission, most recent episode unspecified: Secondary | ICD-10-CM | POA: Diagnosis not present

## 2018-05-29 DIAGNOSIS — Z87891 Personal history of nicotine dependence: Secondary | ICD-10-CM | POA: Insufficient documentation

## 2018-05-29 DIAGNOSIS — I10 Essential (primary) hypertension: Secondary | ICD-10-CM | POA: Insufficient documentation

## 2018-05-29 DIAGNOSIS — Z79899 Other long term (current) drug therapy: Secondary | ICD-10-CM | POA: Insufficient documentation

## 2018-05-29 MED ORDER — ASPIRIN EC 81 MG PO TBEC: 81.0000 mg | DELAYED_RELEASE_TABLET | Freq: Every day | ORAL | 3 refills | Status: DC

## 2018-05-29 NOTE — Progress Notes (Signed)
Patient ID: Meghan Oliver, female   DOB: [birth year], 77 y.o.   MRN: [id number] PCP: Dr. Renold Genta Cardiology: Dr. Aundra Dubin  77 y.o. with history of bipolar disorder, carotid stenosis, HTN, CAD, and hyperlipidemia presents for followup of CAD. She had DES to pLAD in 8/18.  She has severe depression and gets periodic ECT at [facility]. Last echo in 6/19 showed EF 65-70% with mild-moderate AI and mild to moderate MR.   For several years, she had episodes of burning in her lower neck , sometimes radiating up.  These episodes had no particular trigger (not meals or exercise).  In the past, her family had thought they represented GERD and she had been on a PPI and H-2 blocker as well as Mylanta prn.  She had a particularly bad episode in 7/18 and went to the ER.  She had a coronary CTA that showed extensive calcification in the proximal to mid LAD, no definite severe stenosis but cannot rule out.  The study quality was not good enough to interpret by FFR.  Therefore, I took her for coronary angiography in 8/18.  This showed a 90% proximal LAD stenosis that was treated with DES.  Unfortunately, coronary intervention did not stop her symptoms.  She continued to have the same periodic burning sensation in chest and lower neck, not exertional.  This finally seems to have mostly stopped.   She developed symptomatic bradycardia with sinus node dysfunction with junctional escape rhythm.  St Jude PPM was placed in 6/19 with removal of her portacath.   Since PPM placement, she has felt much better.  She has more energy. No significant exertional dyspnea.  No chest pain.  No lightheadedness.  She can climb a flight of steps without problems.   Labs (4/16): K 3.3, creatinine 0.76 Labs (7/18): K 4.4, creatinine 1.02 Labs (8/18): LDL 81, HDL 119, creatinine 0.99, hgb 10.8 Labs (10/18): K 3.6, creatinine 0.77, hgb 10 Labs (7/19): creatinine 0.96  ECG (personally reviewed): a-paced, v-sensed (septal Qs)   PMH: 1. GERD 2.  Bipolar disorder: On lithium. History of ECT.  3. HTN 4. Tremor 5. Hyperlipidemia 6. Carotid stenosis: Carotid dopplers (7/14) with 40-59% bilateral ICA stenosis.  Carotid dopplers (7/15) with 40-59% BICA stenosis.  - Carotid dopplers (8/18): Mild BICA stenosis.  - Carotid dopplers (8/19): Mild BICA stenosis.  7. CAD: Coronary calcium seen on CT.   - ETT (7/13) with 5'45" exercise, 71% MPHR (wanted to stop), no ischemic ECG changes but did not reach target heart rate.  - Lexiscan Cardiolite (4/16) with EF 76%, no ischemia/infarction.  - Coronary CTA (7/18): Interpretation limited by artifact.  Extensive calcification in the proximal to mid LAD, possible 50% stenosis but difficult to quantify due to artifact.   - LHC (8/18): 90% proximal LAD stenosis => DES placed.  - Cardiolite (9/18): EF 75%, low risk study with soft tissue attenuation, no ischemia.  - Echo (6/19): EF 65-70%, mild LVH, mild-moderate AI, mild-moderate MR, severe LAE.  8. Aortic valve disorder:  Echo (2/12) with EF 55-60%, mild AI and MR, PA systolic pressure 36 mmHg.  Echo (12/14) with EF 65-70%, mild LVH, mild AI, very mild AS, PA systolic pressure 44 mmHg.  - Echo (8/18) with EF 65-70%, mild AS, moderate aortic insufficiency.  - Echo (6/19) with mild-moderate AI.  9. Lung nodules: CT chest 12/14 stable, no followup recommended.  10. Sinus node dysfunction with junctional escape noted in 6/19: She had St Jude PPM placed.   11. Syncope: ?orthostatic/dehydration,  8/18.  12. OSA: Mild to moderate on 10/18 sleep study.  13. Dementia: Mild, on Namenda.   SH: Married to [name], quit smoking in 1972, 2 daughters.  FH: Father with CAD. Brother with PPM.   ROS: All systems reviewed and negative except as per HPI.   Current Outpatient Medications  Medication Sig Dispense Refill  . acetaminophen (TYLENOL) 500 MG tablet Take 1,000 mg by mouth every 6 (six) hours as needed for mild pain or headache.     . alendronate  (FOSAMAX) 70 MG tablet Take 1 tablet (70 mg total) by mouth every 7 (seven) days. 1 time per week with fulll glass of water and not lie down for atleast 30 min after taking. 12 tablet 0  . Calcium Carbonate-Vitamin D (CALCIUM 600/VITAMIN D PO) Take 1 tablet by mouth 2 (two) times daily.     . cetirizine (ZYRTEC) 10 MG tablet Take 10 mg by mouth daily as needed for allergies.     . Cholecalciferol (VITAMIN D-3) 5000 units TABS Take 5,000 Units by mouth daily with breakfast.     . L-Methylfolate-B12-B6-B2 (CEREFOLIN PO) Take by mouth.    . lactulose (CHRONULAC) 10 GM/15ML solution TAKE 15 ML BY MOUTH DAILY AS NEEDED FOR MILD CONSTIPATION 1350 mL 1  . Lavender Oil 80 MG CAPS Take 160 mg by mouth at bedtime.    Marland Kitchen levothyroxine (SYNTHROID, LEVOTHROID) 75 MCG tablet TAKE 1 TABLET(75 MCG) BY MOUTH DAILY 90 tablet 3  . lithium carbonate 150 MG capsule Take 300 mg by mouth at bedtime.     Marland Kitchen LORazepam (ATIVAN) 0.5 MG tablet Take 0.25-0.5 mg by mouth See admin instructions. Take 0.25 mg by mouth at bedtime. Take 0.25-0.5 mg by mouth every 6 hours as needed for anxiety    . memantine (NAMENDA) 10 MG tablet Take 10 mg by mouth 2 (two) times daily.    . mirtazapine (REMERON) 7.5 MG tablet Take 7.5 mg by mouth at bedtime.     . Multiple Vitamins-Minerals (MULTIVITAMIN WITH MINERALS) tablet Take 1 tablet by mouth daily.      . nitroGLYCERIN (NITROSTAT) 0.4 MG SL tablet Place 1 tablet (0.4 mg total) under the tongue every 5 (five) minutes as needed for chest pain. 25 tablet 3  . nortriptyline (PAMELOR) 25 MG capsule Take 50 mg by mouth at bedtime.     . Omega-3 Fatty Acids (OMEGA 3 PO) Take 1 capsule by mouth at bedtime.    Marland Kitchen omeprazole (PRILOSEC) 40 MG capsule Take 1 capsule (40 mg total) by mouth daily before supper.    Marland Kitchen OVER THE COUNTER MEDICATION Take 1 tablet by mouth 2 (two) times daily. *Cocavia*    . PARoxetine (PAXIL) 20 MG tablet Take 20 mg by mouth at bedtime.     . primidone (MYSOLINE) 50 MG tablet  Take 1-2 tablets (50-100 mg total) by mouth See admin instructions. Take 100 mg by mouth in the morning and take 50 mg by mouth at bedtime    . Pyridoxine HCl (VITAMIN B-6) 500 MG tablet Take 500 mg by mouth 2 (two) times daily.    . ranitidine (ZANTAC) 150 MG tablet TAKE 1 TABLET(150 MG) BY MOUTH TWICE DAILY 60 tablet 0  . rosuvastatin (CRESTOR) 40 MG tablet Take 1 tablet (40 mg total) by mouth daily. 30 tablet 6  . sennosides-docusate sodium (SENOKOT-S) 8.6-50 MG tablet Take 1 tablet by mouth at bedtime.    . TOVIAZ 8 MG TB24 tablet TAKE 1 TABLET BY MOUTH  DAILY 90 tablet 0  . vitamin B-12 (CYANOCOBALAMIN) 1000 MCG tablet Take 1,000 mcg by mouth at bedtime.    Marland Kitchen aspirin EC 81 MG tablet Take 1 tablet (81 mg total) by mouth daily. 90 tablet 3   No current facility-administered medications for this encounter.     BP (!) 121/56   Pulse (!) 59   Wt 47.6 kg (105 lb)   LMP  (LMP Unknown)   SpO2 100%   BMI 21.21 kg/m  General: NAD Neck: No JVD, no thyromegaly or thyroid nodule.  Lungs: Clear to auscultation bilaterally with normal respiratory effort. CV: Nondisplaced PMI.  Heart regular S1/S2, no S3/S4, 2/6 SEM RUSB with clear S2.  No peripheral edema.  No carotid bruit.  Normal pedal pulses.  Abdomen: Soft, nontender, no hepatosplenomegaly, no distention.  Skin: Intact without lesions or rashes.  Neurologic: Alert and oriented x 3.  Psych: Normal affect. Extremities: No clubbing or cyanosis.  HEENT: Normal.   Assessment/Plan: 1. Symptomatic bradycardia:  Now s/p St Jude PPM.  Symptomatically much improved since then.  2. Carotid stenosis: Mild disease only on last carotid dopplers.   3. CAD:  She had a concerning coronary CTA with extensive calcified plaque in the proximal to mid LAD. Due to artifact, hard to tell degree of stenosis and the study was not good enough to get FFR from it.  I was concerned that her symptoms could represent angina given the amount of coronary plaque she has =>  coronary angiography was done, showing tight proximal LAD stenosis (8/18).  This was treated with DES.  PCI did not immediately help her symptoms (neck and chest burning).  This makes me suspect that her symptoms were GERD-related.  She had a Cardiolite post-PCI in 9/18 that showed no evidence for ischemia. Chest burning has actually subsided in the last few months.  - Continue ASA 81 and statin.  - She can stop Plavix at the end of the month.  4. Aortic valve disorder: Mild to moderate AI on last echo in 6/19.  5. HTN: Labile.  She is not on antihypertensives currently and BP is not elevated.   6. Hyperlipidemia: Continue Crestor, check lipids today.  7. GERD: Chest/neck symptoms did not change with PCI.  I suspect GERD may have been causing her symptoms.   8. Depression: Profound. She is getting ECT though not as often as in the past.    Followup 11/19 before she goes to [place] for several months.    Loralie Champagne 05/29/2018

## 2018-05-29 NOTE — Patient Instructions (Signed)
Labs today (will call for abnormal results, otherwise no news is good news)  STOP taking Plavix after June 03, 2018.  Follow up in November with Dr. Aundra Dubin.

## 2018-05-31 DIAGNOSIS — I440 Atrioventricular block, first degree: Secondary | ICD-10-CM | POA: Diagnosis not present

## 2018-05-31 DIAGNOSIS — N182 Chronic kidney disease, stage 2 (mild): Secondary | ICD-10-CM | POA: Diagnosis not present

## 2018-05-31 DIAGNOSIS — I10 Essential (primary) hypertension: Secondary | ICD-10-CM | POA: Diagnosis not present

## 2018-05-31 DIAGNOSIS — Z9114 Patient's other noncompliance with medication regimen: Secondary | ICD-10-CM | POA: Diagnosis not present

## 2018-05-31 DIAGNOSIS — F3170 Bipolar disorder, currently in remission, most recent episode unspecified: Secondary | ICD-10-CM | POA: Diagnosis not present

## 2018-05-31 DIAGNOSIS — I444 Left anterior fascicular block: Secondary | ICD-10-CM | POA: Diagnosis not present

## 2018-05-31 DIAGNOSIS — I129 Hypertensive chronic kidney disease with stage 1 through stage 4 chronic kidney disease, or unspecified chronic kidney disease: Secondary | ICD-10-CM | POA: Diagnosis not present

## 2018-06-01 DIAGNOSIS — M79661 Pain in right lower leg: Secondary | ICD-10-CM | POA: Diagnosis not present

## 2018-06-01 DIAGNOSIS — M6281 Muscle weakness (generalized): Secondary | ICD-10-CM | POA: Diagnosis not present

## 2018-06-01 DIAGNOSIS — R269 Unspecified abnormalities of gait and mobility: Secondary | ICD-10-CM | POA: Diagnosis not present

## 2018-06-04 ENCOUNTER — Encounter (HOSPITAL_COMMUNITY): Payer: Self-pay

## 2018-06-07 DIAGNOSIS — R269 Unspecified abnormalities of gait and mobility: Secondary | ICD-10-CM | POA: Diagnosis not present

## 2018-06-07 DIAGNOSIS — M79661 Pain in right lower leg: Secondary | ICD-10-CM | POA: Diagnosis not present

## 2018-06-07 DIAGNOSIS — M6281 Muscle weakness (generalized): Secondary | ICD-10-CM | POA: Diagnosis not present

## 2018-06-12 ENCOUNTER — Emergency Department (HOSPITAL_COMMUNITY)
Admission: EM | Admit: 2018-06-12 | Discharge: 2018-06-12 | Disposition: A | Discharge status: Home / Self Care | Payer: Medicare Other | Attending: Emergency Medicine | Admitting: Emergency Medicine

## 2018-06-12 ENCOUNTER — Other Ambulatory Visit: Payer: Self-pay

## 2018-06-12 ENCOUNTER — Emergency Department (HOSPITAL_COMMUNITY): Payer: Medicare Other

## 2018-06-12 ENCOUNTER — Encounter (HOSPITAL_COMMUNITY): Payer: Self-pay | Admitting: Emergency Medicine

## 2018-06-12 ENCOUNTER — Telehealth (HOSPITAL_COMMUNITY): Payer: Self-pay | Admitting: *Deleted

## 2018-06-12 DIAGNOSIS — E039 Hypothyroidism, unspecified: Secondary | ICD-10-CM | POA: Diagnosis not present

## 2018-06-12 DIAGNOSIS — I129 Hypertensive chronic kidney disease with stage 1 through stage 4 chronic kidney disease, or unspecified chronic kidney disease: Secondary | ICD-10-CM | POA: Insufficient documentation

## 2018-06-12 DIAGNOSIS — R079 Chest pain, unspecified: Secondary | ICD-10-CM | POA: Insufficient documentation

## 2018-06-12 DIAGNOSIS — N182 Chronic kidney disease, stage 2 (mild): Secondary | ICD-10-CM | POA: Insufficient documentation

## 2018-06-12 DIAGNOSIS — I200 Unstable angina: Secondary | ICD-10-CM | POA: Diagnosis not present

## 2018-06-12 DIAGNOSIS — E785 Hyperlipidemia, unspecified: Secondary | ICD-10-CM | POA: Diagnosis not present

## 2018-06-12 DIAGNOSIS — K30 Functional dyspepsia: Secondary | ICD-10-CM

## 2018-06-12 DIAGNOSIS — I2510 Atherosclerotic heart disease of native coronary artery without angina pectoris: Secondary | ICD-10-CM | POA: Insufficient documentation

## 2018-06-12 DIAGNOSIS — Z7982 Long term (current) use of aspirin: Secondary | ICD-10-CM | POA: Diagnosis not present

## 2018-06-12 DIAGNOSIS — R1013 Epigastric pain: Secondary | ICD-10-CM | POA: Diagnosis not present

## 2018-06-12 DIAGNOSIS — Z87891 Personal history of nicotine dependence: Secondary | ICD-10-CM | POA: Insufficient documentation

## 2018-06-12 DIAGNOSIS — Z79899 Other long term (current) drug therapy: Secondary | ICD-10-CM | POA: Diagnosis not present

## 2018-06-12 LAB — I-STAT TROPONIN, ED: Troponin i, poc: 0.03 ng/mL (ref 0.00–0.08)

## 2018-06-12 LAB — BASIC METABOLIC PANEL
ANION GAP: 10 (ref 5–15)
BUN: 39 mg/dL — ABNORMAL HIGH (ref 8–23)
CALCIUM: 9.6 mg/dL (ref 8.9–10.3)
CO2: 25 mmol/L (ref 22–32)
Chloride: 101 mmol/L (ref 98–111)
Creatinine, Ser: 0.89 mg/dL (ref 0.44–1.00)
GFR calc Af Amer: >60 mL/min (ref >60)
GFR calc non Af Amer: >60 mL/min (ref >60)
GLUCOSE: 84 mg/dL (ref 70–99)
POTASSIUM: 4.1 mmol/L (ref 3.5–5.1)
Sodium: 136 mmol/L (ref 135–145)

## 2018-06-12 LAB — CBC
HEMATOCRIT: 38.2 % (ref 36.0–46.0)
HEMOGLOBIN: 11.6 g/dL — AB (ref 12.0–15.0)
MCH: 29.5 pg (ref 26.0–34.0)
MCHC: 30.4 g/dL (ref 30.0–36.0)
MCV: 97.2 fL (ref 78.0–100.0)
Platelets: 215 10*3/uL (ref 150–400)
RBC: 3.93 MIL/uL (ref 3.87–5.11)
RDW: 13 % (ref 11.5–15.5)
WBC: 5.3 10*3/uL (ref 4.0–10.5)

## 2018-06-12 LAB — TROPONIN I: Troponin I: <0.03 ng/mL (ref <0.03)

## 2018-06-12 NOTE — Telephone Encounter (Signed)
Patient's husband called to report patient has started having burning sensation across chest that radiates to her neck.  Similar pain to when she had stent placement months prior.  Requesting for Dr. Aundra Dubin to see patient today.  Per Dr. Aundra Dubin patient will need to report to the ER for active pain.  Patient aware and no further questions.

## 2018-06-12 NOTE — Consult Note (Signed)
Primary Physician: [name], MD PCP-Cardiologist:  Loralie Champagne, MD  Reason for Consultation: Chest pain  HPI:    Meghan Oliver is seen today for evaluation of chest pain at the request of Okey Regal PA.   77 yo with history of CAD, profound depression, sinus node dysfunction with PPM presents to ER with chest pain.  She has extensive history of atypical chest pain.  In 8/18, she had DES to proximal LAD (90% stenosis).  This did not affect her symptoms.  Cardiolite in 9/18 showed no ischemia.  She continued to have atypical chest pain post-PCI, but it stopped earlier this year, no particular intervention was done.  She had PPM placed for sinus node dysfunction in 6/19.  She had been feeling very good since her PPM, noting more energy.    For about 10 days, she has noted relatively mild chest discomfort.  It migrates from middle of chest to right side to left side.  It comes and goes, no definite trigger though possibly noted more often after meals.  It is not related to exertion (did yoga recently with no problems).  Given very frequent and prolonged episodes, she came to the ER.  She has had chest pain most of the day today, but mild.  TnI negative initially and ECG unchanged from prior.    Review of Systems: All systems reviewed and negative except as per HPI.   Home Medications Prior to Admission medications   Medication Sig Start Date End Date Taking? Authorizing Provider  acetaminophen (TYLENOL) 500 MG tablet Take 1,000 mg by mouth every 6 (six) hours as needed for mild pain or headache.     [provider]  alendronate (FOSAMAX) 70 MG tablet Take 1 tablet (70 mg total) by mouth every 7 (seven) days. 1 time per week with fulll glass of water and not lie down for atleast 30 min after taking. 05/03/18   Huel Cote, NP  aspirin EC 81 MG tablet Take 1 tablet (81 mg total) by mouth daily. 05/29/18   Larey Dresser, MD  Calcium Carbonate-Vitamin D (CALCIUM  600/VITAMIN D PO) Take 1 tablet by mouth 2 (two) times daily.     [provider]  cetirizine (ZYRTEC) 10 MG tablet Take 10 mg by mouth daily as needed for allergies.     [provider]  Cholecalciferol (VITAMIN D-3) 5000 units TABS Take 5,000 Units by mouth daily with breakfast.     [provider]  L-Methylfolate-B12-B6-B2 (CEREFOLIN PO) Take by mouth.    [provider]  lactulose (CHRONULAC) 10 GM/15ML solution TAKE 15 ML BY MOUTH DAILY AS NEEDED FOR MILD CONSTIPATION 10/11/17   Nandigam, Venia Minks, MD  Lavender Oil 80 MG CAPS Take 160 mg by mouth at bedtime.    [provider]  levothyroxine (SYNTHROID, LEVOTHROID) 75 MCG tablet TAKE 1 TABLET(75 MCG) BY MOUTH DAILY 05/24/18   [name], MD  lithium carbonate 150 MG capsule Take 300 mg by mouth at bedtime.     [provider]  LORazepam (ATIVAN) 0.5 MG tablet Take 0.25-0.5 mg by mouth See admin instructions. Take 0.25 mg by mouth at bedtime. Take 0.25-0.5 mg by mouth every 6 hours as needed for anxiety 03/31/14   [provider]  memantine (NAMENDA) 10 MG tablet Take 10 mg by mouth 2 (two) times daily.    [provider]  mirtazapine (REMERON) 7.5 MG tablet Take 7.5 mg by mouth at  bedtime.     [provider]  Multiple Vitamins-Minerals (MULTIVITAMIN WITH MINERALS) tablet Take 1 tablet by mouth daily.      [provider]  nitroGLYCERIN (NITROSTAT) 0.4 MG SL tablet Place 1 tablet (0.4 mg total) under the tongue every 5 (five) minutes as needed for chest pain. 05/10/17 05/30/19  Larey Dresser, MD  nortriptyline (PAMELOR) 25 MG capsule Take 50 mg by mouth at bedtime.     [provider]  Omega-3 Fatty Acids (OMEGA 3 PO) Take 1 capsule by mouth at bedtime.    [provider]  omeprazole (PRILOSEC) 40 MG capsule Take 1 capsule (40 mg total) by mouth daily before supper. 03/18/18   Isaiah Serge, NP  OVER THE COUNTER MEDICATION Take 1 tablet  by mouth 2 (two) times daily. *Cocavia*    [provider]  PARoxetine (PAXIL) 20 MG tablet Take 20 mg by mouth at bedtime.     [provider]  primidone (MYSOLINE) 50 MG tablet Take 1-2 tablets (50-100 mg total) by mouth See admin instructions. Take 100 mg by mouth in the morning and take 50 mg by mouth at bedtime 03/18/18   Isaiah Serge, NP  Pyridoxine HCl (VITAMIN B-6) 500 MG tablet Take 500 mg by mouth 2 (two) times daily.    [provider]  ranitidine (ZANTAC) 150 MG tablet TAKE 1 TABLET(150 MG) BY MOUTH TWICE DAILY 12/05/17   Nandigam, Venia Minks, MD  rosuvastatin (CRESTOR) 40 MG tablet Take 1 tablet (40 mg total) by mouth daily. 12/05/17   Larey Dresser, MD  sennosides-docusate sodium (SENOKOT-S) 8.6-50 MG tablet Take 1 tablet by mouth at bedtime.    [provider]  TOVIAZ 8 MG TB24 tablet TAKE 1 TABLET BY MOUTH DAILY 05/24/18   [name], MD  vitamin B-12 (CYANOCOBALAMIN) 1000 MCG tablet Take 1,000 mcg by mouth at bedtime.    [provider]    Past Medical History: 1. GERD 2. Bipolar disorder: On lithium. History of ECT.  3. HTN 4. Tremor 5. Hyperlipidemia 6. Carotid stenosis: Carotid dopplers (7/14) with 40-59% bilateral ICA stenosis.  Carotid dopplers (7/15) with 40-59% BICA stenosis.  - Carotid dopplers (8/18): Mild BICA stenosis.  - Carotid dopplers (8/19): Mild BICA stenosis.  7. CAD: Coronary calcium seen on CT.   - ETT (7/13) with 5'45" exercise, 71% MPHR (wanted to stop), no ischemic ECG changes but did not reach target heart rate.  - Lexiscan Cardiolite (4/16) with EF 76%, no ischemia/infarction.  - Coronary CTA (7/18): Interpretation limited by artifact.  Extensive calcification in the proximal to mid LAD, possible 50% stenosis but difficult to quantify due to artifact.   - LHC (8/18): 90% proximal LAD stenosis => DES placed.  - Cardiolite (9/18): EF 75%, low risk study with soft tissue attenuation, no ischemia.  - Echo  (6/19): EF 65-70%, mild LVH, mild-moderate AI, mild-moderate MR, severe LAE.  8. Aortic valve disorder:  Echo (2/12) with EF 55-60%, mild AI and MR, PA systolic pressure 36 mmHg.  Echo (12/14) with EF 65-70%, mild LVH, mild AI, very mild AS, PA systolic pressure 44 mmHg.  - Echo (8/18) with EF 65-70%, mild AS, moderate aortic insufficiency.  - Echo (6/19) with mild-moderate AI.  9. Lung nodules: CT chest 12/14 stable, no followup recommended.  10. Sinus node dysfunction with junctional escape noted in 6/19: She had St Jude PPM placed.   11. Syncope: ?orthostatic/dehydration, 8/18.  12. OSA: Mild to moderate on  10/18 sleep study.  13. Dementia: Mild, on Namenda.   Past Surgical History: Past Surgical History:  Procedure Laterality Date  . APPENDECTOMY  1971  . AUGMENTATION MAMMAPLASTY Bilateral   . CARDIOVASCULAR STRESS TEST  01-30-2015  dr Aundra Dubin   Low risk nuclear study w/ no evidence ischemia or infarction/  normal LV funciton and wall motion , 76%  . COLONOSCOPY W/ BIOPSIES AND POLYPECTOMY  "multiple"  . COLONOSCOPY WITH ESOPHAGOGASTRODUODENOSCOPY (EGD)  last one 08-04-2016  . CORONARY ANGIOPLASTY WITH STENT PLACEMENT  05/16/2017   "LAD"  . CORONARY STENT INTERVENTION N/A 05/16/2017   Procedure: CORONARY STENT INTERVENTION;  Surgeon: Burnell Blanks, MD;  Location: [facility];  Service: Cardiovascular;  Laterality: N/A;  . ESOPHAGOGASTRODUODENOSCOPY  02-26-04  . ESOPHAGOGASTRODUODENOSCOPY  "multiple"  . INSERT / REPLACE / REMOVE PACEMAKER  03/17/2018  . LEFT HEART CATH AND CORONARY ANGIOGRAPHY N/A 05/16/2017   Procedure: LEFT HEART CATH AND CORONARY ANGIOGRAPHY;  Surgeon: Larey Dresser, MD;  Location: [facility];  Service: Cardiovascular;  Laterality: N/A;  . OVARIAN CYST SURGERY  1970s   Laparotomy   . PACEMAKER IMPLANT N/A 03/17/2018   Procedure: PACEMAKER IMPLANT;  Surgeon: Evans Lance, MD;  Location: [facility];  Service: Cardiovascular;   Laterality: N/A;  . PORT-A-CATH PLACEMENT  05/31/2016; 2018   "@ [facility]; for ECT series"; "@ [facility] also"  . PORT-A-CATH REMOVAL N/A 03/11/2017   Procedure: REMOVAL PORT-A-CATH;  Surgeon: Jackolyn Confer, MD;  Location: [facility];  Service: General;  Laterality: N/A;  . PORTA CATH REMOVAL  03/17/2018  . PORTA CATH REMOVAL  03/17/2018   Procedure: PORTA CATH REMOVAL;  Surgeon: Evans Lance, MD;  Location: [facility];  Service: Cardiovascular;;  . TRANSTHORACIC ECHOCARDIOGRAM  09/05/2013  dr Aundra Dubin   mild LVH, ef 64-33%, grade 1 diastolic dysfunction/  very mild AV stenosis with mild AR/  trivial MR and PT/ mild to moderate LAE/ mild TR/ mild pulmonary hypertension with PA peak pressure 53mmHg  . TUBAL LIGATION    . VAGINAL HYSTERECTOMY     "partial"    Family History: Family History  Problem Relation Age of Onset  . Heart attack Father 88       deceased  . Hypertension Father   . Heart disease Father   . Breast cancer Paternal Grandmother        Age unknown  . Breast cancer Paternal Aunt        Age 11's  . Colon cancer Neg Hx     Social History: Social History   Socioeconomic History  . Marital status: Married    Spouse name: Dr. Lyla Son  . Number of children: 2  . Years of education: Not on file  . Highest education level: Not on file  Occupational History  . Occupation: housewife    Employer: UNEMPLOYED  Social Needs  . Financial resource strain: Not on file  . Food insecurity:    Worry: Not on file    Inability: Not on file  . Transportation needs:    Medical: Not on file    Non-medical: Not on file  Tobacco Use  . Smoking status: Former Smoker    Packs/day: 2.00    Years: 15.00    Pack years: 30.00    Types: Cigarettes    Last attempt to quit: 01/13/1971    Years since quitting: 47.4  . Smokeless tobacco: Never Used  Substance and Sexual Activity  .  Alcohol use: Yes    Alcohol/week: 0.0 standard drinks    Comment: 03/17/2018  "nothing in years"  . Drug use: Never  . Sexual activity: Not Currently    Comment: intercourse age 60, sexual partners less than 5  Lifestyle  . Physical activity:    Days per week: Not on file    Minutes per session: Not on file  . Stress: Not on file  Relationships  . Social connections:    Talks on phone: Not on file    Gets together: Not on file    Attends religious service: Not on file    Active member of club or organization: Not on file    Attends meetings of clubs or organizations: Not on file    Relationship status: Not on file  Other Topics Concern  . Not on file  Social History Narrative  . Not on file    Allergies:  Allergies  Allergen Reactions  . Propranolol Other (See Comments)    Low blood pressure  . Brexpiprazole Other (See Comments)    Aphasia and catatonia    Objective:    Vital Signs:   Temp:  [97.8 F (36.6 C)] 97.8 F (36.6 C) (09/09 1454) Pulse Rate:  [59-62] 60 (09/09 1715) Resp:  [12-18] 16 (09/09 1715) BP: (123-133)/(54-67) 130/57 (09/09 1645) SpO2:  [98 %-100 %] 99 % (09/09 1715) Weight:  [45.4 kg] 45.4 kg (09/09 1459)    Weight change: Filed Weights   06/12/18 1459  Weight: 45.4 kg    Intake/Output:  No intake or output data in the 24 hours ending 06/12/18 1751    Physical Exam    General:  Well appearing. No resp difficulty HEENT: normal Neck: supple. JVP not elevated. Carotids 2+ bilat; no bruits. No lymphadenopathy or thyromegaly appreciated. Cor: PMI nondisplaced. Regular rate & rhythm. No rubs, gallops.  2/6 early SEM RUSB.  Lungs: clear Abdomen: soft, nontender, nondistended. No hepatosplenomegaly. No bruits or masses. Good bowel sounds. Extremities: no cyanosis, clubbing, rash, edema Neuro: alert & orientedx3, cranial nerves grossly intact. moves all 4 extremities w/o difficulty. Affect pleasant   Telemetry   A-paced in 70s (personally reviewed)  EKG    A-paced, anteroseptal Qs (no change from prior)  Labs    Basic Metabolic Panel: Recent Labs  Lab 06/12/18 1518  NA 136  K 4.1  CL 101  CO2 25  GLUCOSE 84  BUN 39*  CREATININE 0.89  CALCIUM 9.6    Liver Function Tests: No results for input(s): AST, ALT, ALKPHOS, BILITOT, PROT, ALBUMIN in the last 168 hours. No results for input(s): LIPASE, AMYLASE in the last 168 hours. No results for input(s): AMMONIA in the last 168 hours.  CBC: Recent Labs  Lab 06/12/18 1518  WBC 5.3  HGB 11.6*  HCT 38.2  MCV 97.2  PLT 215    Cardiac Enzymes: TnI 0.03  BNP: BNP (last 3 results) No results for input(s): BNP in the last 8760 hours.  ProBNP (last 3 results) No results for input(s): PROBNP in the last 8760 hours.   CBG: No results for input(s): GLUCAP in the last 168 hours.  Coagulation Studies: No results for input(s): LABPROT, INR in the last 72 hours.   Imaging   Dg Chest 2 View  Result Date: 06/12/2018 CLINICAL DATA:  Chest pain EXAM: CHEST - 2 VIEW COMPARISON:  03/18/2018 FINDINGS: Left-sided pacing device. No acute opacity or pleural effusion. Normal cardiomediastinal silhouette with aortic atherosclerosis. No pneumothorax. Calcified breast implants. IMPRESSION:  No active cardiopulmonary disease. Electronically Signed   By: Donavan Foil M.D.   On: 06/12/2018 15:47      Medications:     Current Medications:    Infusions:      Assessment/Plan   1. CAD/chest pain:  She had a concerning coronary CTA with extensive calcified plaque in the proximal to mid LAD in 7/18. Coronary angiography was done, showing tight proximal LAD stenosis (8/18).  This was treated with DES.  PCI did not help her symptoms (neck and chest burning).  This makes me suspect that her symptoms were GERD-related.  She had a Cardiolite post-PCI in 9/18 that showed no evidence for ischemia. Chest burning subsided earlier this year, but now has recurred and become very frequent/more severe.  It is atypical (not exertional, may be related to meals  at times).  ECG unchanged, initial troponin negative.  My suspicion is that symptoms are GI-related.  - Continue ASA 81 and statin.  - She is now off Plavix.  - Repeat troponin in 1 hour, if troponin remains negative, think she can be discharged with arrangement for outpatient stress test. If stress test negative, will need GI workup.  2. Symptomatic bradycardia:  Now s/p St Jude PPM.  Symptomatically much improved since then.  3. Carotid stenosis: Mild disease only on last carotid dopplers.   4. Aortic valve disorder: Mild to moderate AI on last echo in 6/19.  5. HTN: Labile.  She is not on antihypertensives currently.  I suspect she may need a BP agent (low dose amlodipine), but husband wants to check her readings a little longer at home as she is sometimes very low.  6. Hyperlipidemia: Continue Crestor.  7. GERD: This may be the cause of her current symptoms.  If cardiac w/u negative, needs to see GI.   8. Depression: Profound. She is getting ECT though not as often as in the past.    Length of Stay: 0  Loralie Champagne, MD  06/12/2018, 5:51 PM  Advanced Heart Failure Team Pager [phone] (M-F; 7a - 4p)  Please contact [facility] for night-coverage after hours (4p -7a ) and weekends on [email or web address]

## 2018-06-12 NOTE — ED Triage Notes (Signed)
Pt arrives via POV from home with REflux /CP for the last week. Pain radiaties to left upper chest and neck, pain has become more frequent and lasting longer each time. Denies nausea, vomiting, weakness, diaphoresis with the pain. Hx of stent and pacemaker. Denies recent cough/fever. VSS. NAD at present.

## 2018-06-12 NOTE — ED Provider Notes (Signed)
[facility] EMERGENCY DEPARTMENT Provider Note   CSN: [id number] Arrival date & time: 06/12/18  1450    History   Chief Complaint Chief Complaint  Patient presents with  . Chest Pain    HPI Meghan Oliver is a 77 y.o. female.  HPI    69 YOF with a past medical history of bipolar disorder, carotid stenosis, hypertension, CAD, hyperlipidemia status post DES to PDA LAD on 8/18 resents today with complaints of chest pain.  She notes over the last 10 days she has had burning sensation in her substernal chest with radiation up into her neck.  She notes this is been fairly consistent.  She notes attempting proton pump inhibitor and H2 blocker with no significant change in her symptoms.  Patient denies any significant shortness of breath, worsening symptoms with exertion, lower extremity swelling or edema.  She notes the symptoms are similar to previous that resulted in coronary drug-eluting stent after 90% he proximal LAD stenosis.  She notes after the stent she did have improvement in her symptoms.   Past Medical History:  Diagnosis Date  . Anxiety   . Arthritis    "hips, spine" (03/17/2018)  . Bipolar II disorder ([place])   . Chronic bronchitis ([place])   . Chronic lower back pain   . Chronic right hip pain   . CKD (chronic kidney disease), stage II   . Esophagitis, erosive   . GAD (generalized anxiety disorder)   . GERD (gastroesophageal reflux disease)   . Headache    "maybe monthly" (03/17/2018))  . Heart murmur, systolic   . History of adenomatous polyp of colon    08-04-2016  tubular adenoma  . History of blood transfusion 12/2017   "related to vascular hematoma"  . History of electroconvulsive therapy    at [facility]--  started 04-15-2015 to 11-17-2016  total greater than 40 times  . History of hiatal hernia   . Hyperlipidemia   . Hypertension   . Hypothyroidism   . Internal carotid artery stenosis, bilateral    per last duplex 05-01-2014  bilateral ICA 40-59%    . Major depression, chronic    ECT treatments extensive and multiple started 07/ 2016  . Memory loss    "both short and long-term; needs frequent reminders to follow instrucitons" (05/16/2017)  . Migraines    "none in years" (03/17/2018)  . OSA (obstructive sleep apnea)    per study 06/ 2012 moderate OSA  ; "refuses to wear masks" (03/17/2018)  . Osteoporosis   . Pulmonary nodule    monitored by pcp  . S/P placement of cardiac pacemaker 03/17/18 [facility]  03/18/2018    Patient Active Problem List   Diagnosis Date Noted  . S/P placement of cardiac pacemaker 03/17/18 [facility]  03/18/2018  . Sinus node dysfunction ([place]) 03/17/2018  . Bradycardia 07/16/2017  . Unstable angina ([place])   . Hypotension 04/20/2017  . CAD (coronary artery disease) 01/05/2016  . Coronary artery calcification seen on CAT scan 06/25/2012  . Lung nodule 06/25/2012  . Hypothyroidism 03/21/2012  . Chronic cough 08/12/2011  . OSA (obstructive sleep apnea) 04/05/2011  . Essential hypertension 11/06/2010  . EDEMA 11/06/2010  . Chest pain 11/06/2010  . Hyperlipidemia 08/22/2007  . Bipolar disorder ([place]) 08/22/2007  . GERD 08/22/2007  . LOW BACK PAIN 08/22/2007  . OSTEOPOROSIS 08/22/2007  . INSOMNIA 08/22/2007  . SYSTOLIC MURMUR 32/54/9826    Past Surgical History:  Procedure Laterality Date  . APPENDECTOMY  1971  .  AUGMENTATION MAMMAPLASTY Bilateral   . CARDIOVASCULAR STRESS TEST  01-30-2015  dr Aundra Dubin   Low risk nuclear study w/ no evidence ischemia or infarction/  normal LV funciton and wall motion , 76%  . COLONOSCOPY W/ BIOPSIES AND POLYPECTOMY  "multiple"  . COLONOSCOPY WITH ESOPHAGOGASTRODUODENOSCOPY (EGD)  last one 08-04-2016  . CORONARY ANGIOPLASTY WITH STENT PLACEMENT  05/16/2017   "LAD"  . CORONARY STENT INTERVENTION N/A 05/16/2017   Procedure: CORONARY STENT INTERVENTION;  Surgeon: Burnell Blanks, MD;  Location: [facility];  Service: Cardiovascular;  Laterality: N/A;  .  ESOPHAGOGASTRODUODENOSCOPY  02-26-04  . ESOPHAGOGASTRODUODENOSCOPY  "multiple"  . INSERT / REPLACE / REMOVE PACEMAKER  03/17/2018  . LEFT HEART CATH AND CORONARY ANGIOGRAPHY N/A 05/16/2017   Procedure: LEFT HEART CATH AND CORONARY ANGIOGRAPHY;  Surgeon: Larey Dresser, MD;  Location: [facility];  Service: Cardiovascular;  Laterality: N/A;  . OVARIAN CYST SURGERY  1970s   Laparotomy   . PACEMAKER IMPLANT N/A 03/17/2018   Procedure: PACEMAKER IMPLANT;  Surgeon: Evans Lance, MD;  Location: [facility];  Service: Cardiovascular;  Laterality: N/A;  . PORT-A-CATH PLACEMENT  05/31/2016; 2018   "@ [facility]; for ECT series"; "@ [facility] also"  . PORT-A-CATH REMOVAL N/A 03/11/2017   Procedure: REMOVAL PORT-A-CATH;  Surgeon: Jackolyn Confer, MD;  Location: [facility];  Service: General;  Laterality: N/A;  . PORTA CATH REMOVAL  03/17/2018  . PORTA CATH REMOVAL  03/17/2018   Procedure: PORTA CATH REMOVAL;  Surgeon: Evans Lance, MD;  Location: [facility];  Service: Cardiovascular;;  . TRANSTHORACIC ECHOCARDIOGRAM  09/05/2013  dr Aundra Dubin   mild LVH, ef 16-10%, grade 1 diastolic dysfunction/  very mild AV stenosis with mild AR/  trivial MR and PT/ mild to moderate LAE/ mild TR/ mild pulmonary hypertension with PA peak pressure 25mmHg  . TUBAL LIGATION    . VAGINAL HYSTERECTOMY     "partial"     OB History    Gravida  2   Para  2   Term  2   Preterm      AB      Living  2     SAB      TAB      Ectopic      Multiple      Live Births               Home Medications    Prior to Admission medications   Medication Sig Start Date End Date Taking? Authorizing Provider  acetaminophen (TYLENOL) 500 MG tablet Take 1,000 mg by mouth every 6 (six) hours as needed for mild pain or headache.     [provider]  alendronate (FOSAMAX) 70 MG tablet Take 1 tablet (70 mg total) by mouth every 7 (seven) days. 1 time per week with fulll glass of water  and not lie down for atleast 30 min after taking. 05/03/18   Huel Cote, NP  aspirin EC 81 MG tablet Take 1 tablet (81 mg total) by mouth daily. 05/29/18   Larey Dresser, MD  Calcium Carbonate-Vitamin D (CALCIUM 600/VITAMIN D PO) Take 1 tablet by mouth 2 (two) times daily.     [provider]  cetirizine (ZYRTEC) 10 MG tablet Take 10 mg by mouth daily as needed for allergies.     [provider]  Cholecalciferol (VITAMIN D-3) 5000 units TABS Take 5,000 Units by mouth daily with breakfast.  [provider]  L-Methylfolate-B12-B6-B2 (CEREFOLIN PO) Take by mouth.    [provider]  lactulose (CHRONULAC) 10 GM/15ML solution TAKE 15 ML BY MOUTH DAILY AS NEEDED FOR MILD CONSTIPATION 10/11/17   Nandigam, Venia Minks, MD  Lavender Oil 80 MG CAPS Take 160 mg by mouth at bedtime.    [provider]  levothyroxine (SYNTHROID, LEVOTHROID) 75 MCG tablet TAKE 1 TABLET(75 MCG) BY MOUTH DAILY 05/24/18   [name], MD  lithium carbonate 150 MG capsule Take 300 mg by mouth at bedtime.     [provider]  LORazepam (ATIVAN) 0.5 MG tablet Take 0.25-0.5 mg by mouth See admin instructions. Take 0.25 mg by mouth at bedtime. Take 0.25-0.5 mg by mouth every 6 hours as needed for anxiety 03/31/14   [provider]  memantine (NAMENDA) 10 MG tablet Take 10 mg by mouth 2 (two) times daily.    [provider]  mirtazapine (REMERON) 7.5 MG tablet Take 7.5 mg by mouth at bedtime.     [provider]  Multiple Vitamins-Minerals (MULTIVITAMIN WITH MINERALS) tablet Take 1 tablet by mouth daily.      [provider]  nitroGLYCERIN (NITROSTAT) 0.4 MG SL tablet Place 1 tablet (0.4 mg total) under the tongue every 5 (five) minutes as needed for chest pain. 05/10/17 05/30/19  Larey Dresser, MD  nortriptyline (PAMELOR) 25 MG capsule Take 50 mg by mouth at bedtime.     [provider]  Omega-3 Fatty Acids (OMEGA 3 PO) Take 1 capsule  by mouth at bedtime.    [provider]  omeprazole (PRILOSEC) 40 MG capsule Take 1 capsule (40 mg total) by mouth daily before supper. 03/18/18   Isaiah Serge, NP  OVER THE COUNTER MEDICATION Take 1 tablet by mouth 2 (two) times daily. *[place]*    [provider]  PARoxetine (PAXIL) 20 MG tablet Take 20 mg by mouth at bedtime.     [provider]  primidone (MYSOLINE) 50 MG tablet Take 1-2 tablets (50-100 mg total) by mouth See admin instructions. Take 100 mg by mouth in the morning and take 50 mg by mouth at bedtime 03/18/18   Isaiah Serge, NP  Pyridoxine HCl (VITAMIN B-6) 500 MG tablet Take 500 mg by mouth 2 (two) times daily.    [provider]  ranitidine (ZANTAC) 150 MG tablet TAKE 1 TABLET(150 MG) BY MOUTH TWICE DAILY 12/05/17   Nandigam, Venia Minks, MD  rosuvastatin (CRESTOR) 40 MG tablet Take 1 tablet (40 mg total) by mouth daily. 12/05/17   Larey Dresser, MD  sennosides-docusate sodium (SENOKOT-S) 8.6-50 MG tablet Take 1 tablet by mouth at bedtime.    [provider]  TOVIAZ 8 MG TB24 tablet TAKE 1 TABLET BY MOUTH DAILY 05/24/18   [name], MD  vitamin B-12 (CYANOCOBALAMIN) 1000 MCG tablet Take 1,000 mcg by mouth at bedtime.    [provider]    Family History Family History  Problem Relation Age of Onset  . Heart attack Father 22       deceased  . Hypertension Father   . Heart disease Father   . Breast cancer Paternal Grandmother        Age unknown  . Breast cancer Paternal Aunt        Age 20's  . Colon cancer Neg Hx     Social History Social History   Tobacco Use  . Smoking status: Former Smoker    Packs/day: 2.00  Years: 15.00    Pack years: 30.00    Types: Cigarettes    Last attempt to quit: 01/13/1971    Years since quitting: 47.4  . Smokeless tobacco: Never Used  Substance Use Topics  . Alcohol use: Yes    Alcohol/week: 0.0 standard drinks    Comment: 03/17/2018 "nothing in years"  . Drug use:  Never     Allergies   Propranolol and Brexpiprazole   Review of Systems Review of Systems  All other systems reviewed and are negative.    Physical Exam Updated Vital Signs BP (!) 154/59 (BP Location: Right Arm)   Pulse 61   Temp 97.8 F (36.6 C) (Oral)   Resp 16   Ht 4\' 11"  (1.499 m)   Wt 45.4 kg   LMP  (LMP Unknown)   SpO2 99%   BMI 20.20 kg/m   Physical Exam  Constitutional: She is oriented to person, place, and time. She appears well-developed and well-nourished.  HENT:  Head: Normocephalic and atraumatic.  Eyes: Pupils are equal, round, and reactive to light. Conjunctivae are normal. Right eye exhibits no discharge. Left eye exhibits no discharge. No scleral icterus.  Neck: Normal range of motion. No JVD present. No tracheal deviation present.  Cardiovascular: Normal rate and regular rhythm. Exam reveals no gallop and no friction rub.  Murmur heard. Soft systolic murmur  Pulmonary/Chest: Effort normal. No stridor. No respiratory distress. She has no wheezes. She has no rales. She exhibits no tenderness.  Abdominal: Soft. Bowel sounds are normal. She exhibits no distension and no mass. There is no tenderness. There is no rebound and no guarding. No hernia.  Musculoskeletal: She exhibits no edema.  Neurological: She is alert and oriented to person, place, and time. Coordination normal.  Skin: Skin is warm.  Psychiatric: She has a normal mood and affect. Her behavior is normal. Judgment and thought content normal.  Nursing note and vitals reviewed.    ED Treatments / Results  Labs (all labs ordered are listed, but only abnormal results are displayed) Labs Reviewed  BASIC METABOLIC PANEL - Abnormal; Notable for the following components:      Result Value   BUN 39 (*)    All other components within normal limits  CBC - Abnormal; Notable for the following components:   Hemoglobin 11.6 (*)    All other components within normal limits  TROPONIN I  I-STAT  TROPONIN, ED    EKG EKG Interpretation  Date/Time:  Monday June 12 2018 14:53:27 EDT Ventricular Rate:  61 PR Interval:  202 QRS Duration: 94 QT Interval:  434 QTC Calculation: 436 R Axis:   -27 Text Interpretation:  Atrial-paced rhythm Possible Anteroseptal infarct , age undetermined Abnormal ECG when compared to priorm similar paced rhyth,.  No STEMI Confirmed by Antony Blackbird [phone]) on 06/12/2018 4:18:34 PM   Radiology Dg Chest 2 View  Result Date: 06/12/2018 CLINICAL DATA:  Chest pain EXAM: CHEST - 2 VIEW COMPARISON:  03/18/2018 FINDINGS: Left-sided pacing device. No acute opacity or pleural effusion. Normal cardiomediastinal silhouette with aortic atherosclerosis. No pneumothorax. Calcified breast implants. IMPRESSION: No active cardiopulmonary disease. Electronically Signed   By: Donavan Foil M.D.   On: 06/12/2018 15:47    Procedures Procedures (including critical care time)  Medications Ordered in ED Medications - No data to display   Initial Impression / Assessment and Plan / ED Course  I have reviewed the triage vital signs and the nursing notes.  Pertinent labs & imaging results that  were available during my care of the patient were reviewed by me and considered in my medical decision making (see chart for details).     Labs: I-STAT troponin, BMP, CBC, DG chest 2 view  Imaging: ED EKG  Consults:  Therapeutics:  Discharge Meds:   Assessment/Plan: 77 year old female presents today with complaints of burning sensation in her chest.  Patient well-appearing in no acute distress, no pain at the time my evaluation.  Patient does have significant cardiac risk factors, but has initial negative troponin.  Cardiology consulted who evaluated patient at bedside with recommendation of repeat troponin and discharge home.  Patient well-appearing in no acute distress discharged outpatient follow-up and strict return precautions.  She verbalized understanding and agreement  to today's plan had no further questions or concerns at the time discharge.   Final Clinical Impressions(s) / ED Diagnoses   Final diagnoses:  Chest pain, unspecified type  Indigestion    ED Discharge Orders    None       Francee Gentile 06/12/18 2115    Tegeler, Gwenyth Allegra, MD 06/14/18 1555

## 2018-06-12 NOTE — Discharge Instructions (Addendum)
Please read attached information. If you experience any new or worsening signs or symptoms please return to the emergency room for evaluation. Please follow-up with your primary care provider or specialist as discussed.  °

## 2018-06-12 NOTE — ED Notes (Signed)
X-ray confirmed they will bring pt back to room after exam

## 2018-06-12 NOTE — ED Notes (Signed)
ED Provider at bedside. 

## 2018-06-12 NOTE — ED Provider Notes (Signed)
MSE was initiated and I personally evaluated the patient and placed orders (if any) at  3:20 PM on June 12, 2018.  The patient appears stable so that the remainder of the MSE may be completed by another provider.  Patient placed in Quick Look pathway, seen and evaluated   Chief Complaint: CP   HPI:   Patient here whith c/o reflux / cp x 1 week. HX of CAD  ROS: + cp - for sob, nause, diaphoresis (one)  Physical Exam:   Gen: No distress  Neuro: Awake and Alert  Skin: Warm    Focused Exam: Lungs CTAB, RRR   Initiation of care has begun. The patient has been counseled on the process, plan, and necessity for staying for the completion/evaluation, and the remainder of the medical screening examination    Margarita Mail, PA-C 06/12/18 1522    Long, Wonda Olds, MD 06/12/18 1950

## 2018-06-13 ENCOUNTER — Telehealth (HOSPITAL_COMMUNITY): Payer: Self-pay | Admitting: *Deleted

## 2018-06-13 DIAGNOSIS — R0789 Other chest pain: Secondary | ICD-10-CM

## 2018-06-13 NOTE — Telephone Encounter (Signed)
Per Dr. Aundra Dubin patient needs Cardiolite stress test this week.  Order placed and will forward to scheduling.

## 2018-06-14 ENCOUNTER — Other Ambulatory Visit: Payer: Self-pay | Admitting: Gastroenterology

## 2018-06-15 ENCOUNTER — Telehealth (HOSPITAL_COMMUNITY): Payer: Self-pay

## 2018-06-15 NOTE — Telephone Encounter (Signed)
Encounter complete. 

## 2018-06-20 ENCOUNTER — Ambulatory Visit (HOSPITAL_COMMUNITY)
Admission: RE | Admit: 2018-06-20 | Discharge: 2018-06-20 | Disposition: A | Discharge status: Home / Self Care | Payer: Medicare Other | Source: Ambulatory Visit | Attending: Cardiology | Admitting: Cardiology

## 2018-06-20 DIAGNOSIS — R0789 Other chest pain: Secondary | ICD-10-CM | POA: Diagnosis not present

## 2018-06-20 LAB — MYOCARDIAL PERFUSION IMAGING
CHL CUP NUCLEAR SDS: 1
CHL CUP NUCLEAR SRS: 4
CHL CUP NUCLEAR SSS: 5
LV dias vol: 79 mL (ref 46–106)
LVSYSVOL: 23 mL
NUC STRESS TID: 1.32
Peak HR: 66 {beats}/min
Rest HR: 60 {beats}/min

## 2018-06-20 MED ORDER — TECHNETIUM TC 99M TETROFOSMIN IV KIT
27.1000 | PACK | Freq: Once | INTRAVENOUS | Status: AC | PRN
  Administered 2018-06-20: 27.1 via INTRAVENOUS
  Filled 2018-06-20: qty 28

## 2018-06-20 MED ORDER — TECHNETIUM TC 99M TETROFOSMIN IV KIT
8.4000 | PACK | Freq: Once | INTRAVENOUS | Status: AC | PRN
  Administered 2018-06-20: 8.4 via INTRAVENOUS
  Filled 2018-06-20: qty 9

## 2018-06-20 MED ORDER — AMINOPHYLLINE 25 MG/ML IV SOLN
75.0000 mg | Freq: Once | INTRAVENOUS | Status: AC
  Administered 2018-06-20: 75 mg via INTRAVENOUS

## 2018-06-20 MED ORDER — REGADENOSON 0.4 MG/5ML IV SOLN
0.4000 mg | Freq: Once | INTRAVENOUS | Status: AC
  Administered 2018-06-20: 0.4 mg via INTRAVENOUS

## 2018-06-21 ENCOUNTER — Telehealth (HOSPITAL_COMMUNITY): Payer: Self-pay | Admitting: *Deleted

## 2018-06-21 ENCOUNTER — Encounter: Payer: Medicare Other | Admitting: Internal Medicine

## 2018-06-21 DIAGNOSIS — F320 Major depressive disorder, single episode, mild: Secondary | ICD-10-CM | POA: Diagnosis not present

## 2018-06-21 DIAGNOSIS — I2510 Atherosclerotic heart disease of native coronary artery without angina pectoris: Secondary | ICD-10-CM | POA: Diagnosis not present

## 2018-06-21 NOTE — Telephone Encounter (Signed)
Dr. Carver Fila at [facility] called asking for Dr. Claris Gladden approval to start patient on 0.1 mg of clonidine  Mornings of ECT treatments.  Dr. Aundra Dubin says that's fine and Dr. Carver Fila is aware and will order for patient.

## 2018-06-23 ENCOUNTER — Other Ambulatory Visit (HOSPITAL_COMMUNITY): Payer: Self-pay | Admitting: Cardiology

## 2018-06-23 ENCOUNTER — Other Ambulatory Visit (HOSPITAL_COMMUNITY): Payer: Self-pay

## 2018-06-23 MED ORDER — ROSUVASTATIN CALCIUM 40 MG PO TABS: 40.0000 mg | ORAL_TABLET | Freq: Every day | ORAL | 1 refills | Status: DC

## 2018-06-26 DIAGNOSIS — H353131 Nonexudative age-related macular degeneration, bilateral, early dry stage: Secondary | ICD-10-CM | POA: Diagnosis not present

## 2018-06-27 DIAGNOSIS — M25551 Pain in right hip: Secondary | ICD-10-CM | POA: Diagnosis not present

## 2018-06-27 DIAGNOSIS — M545 Low back pain: Secondary | ICD-10-CM | POA: Diagnosis not present

## 2018-07-01 ENCOUNTER — Encounter (HOSPITAL_COMMUNITY): Payer: Self-pay

## 2018-07-03 NOTE — Telephone Encounter (Signed)
Message received via mychart and routed to [place] for advice. (see message below)  On 06/21/18 [name] had ECT at [facility] and during that time experienced significant hypertension due to anxiety of having the ongoing procedure. At that time the anesthesiologist at [facility] Dr. Nanine Means and Dr. Everlene Other asked we switch from the Amlodipine 10mg  she received only prior to ECT treatments to Clonadine 0.1mg  x 1 on mornings prior to ECT. Dr. Aundra Dubin agreed by phone.  I would like to request a new RX be called to [facility] on [address] for this new med.  Clonadine 0.1mg  x1 on mornings of ECT #10 with 1 refill.  please feel free to call me with any questions or concerns.  [place] RN- primary caregiver of Jasmynn  [phone].   Thank you very much!

## 2018-07-04 ENCOUNTER — Other Ambulatory Visit: Payer: Self-pay | Admitting: Psychiatry

## 2018-07-04 ENCOUNTER — Other Ambulatory Visit (HOSPITAL_COMMUNITY): Payer: Self-pay | Admitting: Cardiology

## 2018-07-04 DIAGNOSIS — Z79899 Other long term (current) drug therapy: Secondary | ICD-10-CM | POA: Diagnosis not present

## 2018-07-04 DIAGNOSIS — F332 Major depressive disorder, recurrent severe without psychotic features: Secondary | ICD-10-CM | POA: Diagnosis not present

## 2018-07-04 MED ORDER — CLONIDINE HCL 0.1 MG PO TABS: 0.1000 mg | ORAL_TABLET | Freq: Every day | ORAL | 1 refills | Status: DC | PRN

## 2018-07-05 ENCOUNTER — Ambulatory Visit: Payer: Medicare Other | Admitting: Internal Medicine

## 2018-07-05 DIAGNOSIS — M48061 Spinal stenosis, lumbar region without neurogenic claudication: Secondary | ICD-10-CM | POA: Diagnosis not present

## 2018-07-05 DIAGNOSIS — M25551 Pain in right hip: Secondary | ICD-10-CM | POA: Diagnosis not present

## 2018-07-05 DIAGNOSIS — M6281 Muscle weakness (generalized): Secondary | ICD-10-CM | POA: Diagnosis not present

## 2018-07-07 LAB — LITHIUM LEVEL: Lithium Lvl: 0.5 mmol/L — ABNORMAL LOW (ref 0.6–1.2)

## 2018-07-07 LAB — NORTRIPTYLINE LEVEL: NORTRIPTYLINE LVL: 51 ug/L (ref 50–150)

## 2018-07-14 NOTE — Progress Notes (Signed)
Thanks for the lab results, all dosages are correct as you listed.  Meghan Oliver is doing well, but seems to be having some increased anxiety and obsessive thoughts. She gets caught up in meaningless details and then obsesses over them. One day she made Meghan Oliver take her to the bank twice in the same day to make sure her money was still there. Her short term memory loss seems to be one component of this and as always some days are better than others. Her anxiety was not markedly improved immediately following her last ECT treatment. She is taking the occasional PRN lorazepam for the anxiety along with her bedtime dose. Most of the time she is appropriate and initiates conversation and happy, denying depression at all.  They are planning on going to [facility] for the season before Thanksgiving, but I will be sure we come in a see you beforehand. If she seems to be slipping before that I will contact you immediately. We need to make sure that Meghan Oliver does NOT allow her to drive- he has been hinting at this and that she's fine now.  Please let me know your thoughts regarding the above and if there are any changes you recommend.  Thanks,  Meghan Quan RN  On Jul 10, 2018, at 6:48 PM, Meghan Oliver [email or web address]> wrote:  This message was sent securely by [facility].     Meghan Oliver's labs today  Li 0.5 (last 0.6) on 300mg  q Day Nortriptyline 51 on 50mg  q HS with paroxetine 20 mg/D  Please verify these dosages are correct.  If she's doing well then no changes are necessary.  Thanks,  Meghan Parents MD, [facility] [address] [place], [place] [address] [phone]  No change indicated.

## 2018-07-26 DIAGNOSIS — F320 Major depressive disorder, single episode, mild: Secondary | ICD-10-CM | POA: Diagnosis not present

## 2018-07-26 DIAGNOSIS — I130 Hypertensive heart and chronic kidney disease with heart failure and stage 1 through stage 4 chronic kidney disease, or unspecified chronic kidney disease: Secondary | ICD-10-CM | POA: Diagnosis not present

## 2018-07-26 DIAGNOSIS — I5032 Chronic diastolic (congestive) heart failure: Secondary | ICD-10-CM | POA: Diagnosis not present

## 2018-07-26 DIAGNOSIS — N182 Chronic kidney disease, stage 2 (mild): Secondary | ICD-10-CM | POA: Diagnosis not present

## 2018-07-26 DIAGNOSIS — D631 Anemia in chronic kidney disease: Secondary | ICD-10-CM | POA: Diagnosis not present

## 2018-07-31 DIAGNOSIS — M25551 Pain in right hip: Secondary | ICD-10-CM | POA: Diagnosis not present

## 2018-07-31 DIAGNOSIS — M6281 Muscle weakness (generalized): Secondary | ICD-10-CM | POA: Diagnosis not present

## 2018-07-31 DIAGNOSIS — M48061 Spinal stenosis, lumbar region without neurogenic claudication: Secondary | ICD-10-CM | POA: Diagnosis not present

## 2018-08-03 ENCOUNTER — Ambulatory Visit (INDEPENDENT_AMBULATORY_CARE_PROVIDER_SITE_OTHER): Payer: Medicare Other | Admitting: Internal Medicine

## 2018-08-03 DIAGNOSIS — I200 Unstable angina: Secondary | ICD-10-CM

## 2018-08-03 DIAGNOSIS — Z23 Encounter for immunization: Secondary | ICD-10-CM | POA: Diagnosis not present

## 2018-08-03 NOTE — Patient Instructions (Signed)
Patient received a flu vaccine IM L deltoid, AV, CMA  

## 2018-08-04 ENCOUNTER — Encounter: Payer: Self-pay | Admitting: Emergency Medicine

## 2018-08-10 NOTE — Progress Notes (Signed)
Meghan Oliver was seen today in the movement disorders clinic for neurologic consultation at the request of Baxley, Cresenciano Lick, MD.  The consultation is for the evaluation of tremor.  She is accompanied by her husband who supplements the history.  She supplied numerous records I had the opportunity to review.  She was a previous patient of Dr. love.  It appears that she last saw him about one year ago, in November, 2013.  The pt reports tremor x 2 years but her husband states at least 3-4 years.  It is in both hands, R worse than L.  She is R hand dominant.  It bothers her most with drinking coffee.  She has to drink out of a straw.  She cannot eat soup.  She does not drink caffeine.  Alcohol does improve tremor.  She has trouble writing because of the tremor.    The patient does have a very long history of depression.  She is currently on lithium and Zyprexa, which she has been on for 5 years and she states that both of these help.  She is status post ECT treatments began in January, 2000 and continued for 50 treatments of December, 2010.  In the past, the patient has been on Topamax for tremor, Inderal, 10 mg twice a day (this caused decrease in heart rate) and is currently on clonazepam.  She does not even remember being on the Topamax, so cannot remember if she had side effects or if it just did not work.  02/18/14 update:  Patient is seen back in followup today, accompanied by her husband as well as her nurse [name]), both of whom supplement the history.  Tremor is better than it was 6 weeks ago.  Had unsteadiness while in [place] and had to use a cane.  Has to have a railing to go up and down stairs.  Saw a neurologist in [place] last November as was becoming very sedated.  It was recommended that she decrease the lithium and she did.  Thinks that is why tremor is improved but admits that it tends to wax and wane.  They bring up a different concern.  Her husband recently discovered that the patient drinks 8-12 oz of  vodka per day.  Apparently, her nurse has been aware of this for years.  Pt saw her psychiatrist, [name], recently but didn't talk to him about this but now would like me to contact him about this.  She would like to see him before she goes on a big family trip to [place] on June 3.    04/08/14 update:  Pt is seen in f/u earlier than expected.  She is accompanied by her nurse, who supplements the history.  She was supposed to have an MRI brain since last visit but there was an error and this was not done. She had labs done since last visit and she has seen her PCP.  I reviewed those notes.  Her lithium was increased since last visit.  She is now on 150 mg in the AM and then 300 mg at night (was just 300 at night) and is now on ativan bid prn.  Asks me how many she can take per day.  They haven't noted increased tremor.  She did just get an iphone and is having trouble using the letters but her granddaughter showed her how to use the voice activated system so she is doing well with that.  Cannot eat with a spoon though  or things spill.  Depression seems worse as there are so family "things" going on.  Has an upcoming appt with [name].  Enjoyed vacation in [place].  04/29/14 update:  The patient was seen as a work in appointment today.  She is accompanied by her husband  who supplements the history.  She had an MRI of her brain on 04/18/2014.  It was essentially unremarkable.  No significant cerebellar atrophy.  Last visit, I did increase her primidone to 50 mg twice a day.  Her tremor has been increased.  She had 2 episodes where tremor was so bad that she could barely walk and she needed help down then stairs.  Drinking wine spritzers 3 days per week but no other alcohol.   Lithium dose has not changed since last visit but level has increased (as dose did change visit before that).  She reports that she is now off of ativan.    05/21/15 update:  The patient returns today.  She is accompanied by her husband  and her nurse, both of whom supplement the history.  I have not seen her in over a year.  Last visit, tremor had increased some because she had just increased her lithium.  She was also on Zyprexa.  We ended up increasing her primidone, 50 mg, to compensate so that she is taking 2 tablets in the morning and 1 in the afternoon.  I have not seen her since.  They never did that because she was leaving for [place] and the same day I saw her,  [name] decided to drop the lithium.   The patient reports that she went to [facility] and began to struggle with increasing depression and she even had psychosis.  [name] increased the zyprexa in Feb and had to increase the lithium.  She has undergone further ECT treatments for her depression.  Lithium was put on hold for that in April.  They did 12 unilateral tx's but that did not help so June 26 they started bilateral treatments for a month and restarted the lithium on July 26.  The bilateral ECT did help.  She is on 450 mg of lithium, 10 mg of zyprexa and 30 mg of prozac.  She is still on 50 mg bid of primidone.  She is c/o significant tremor.  Her husband states that when she was off the lithium tremor was much better.   I reviewed records since her last visit with [name].  09/06/17 update: Patient is seen today for her tremor.  I have not seen her in 2-1/2 years.  I have reviewed numerous records and have spoken to her family in the meantime.  She is accompanied by her caregiver, [name], who supplements the history.  She brings and reviews with me a 2-year updated history.  Her depression has gotten worse with the course of time.  She has been undergoing ECT at [facility].  They feel that this has continued to worsen her short-term memory.  However, she really has good support around her.  She is not driving.  In regards to alcohol, she is not drinking anything.  Because of the depression, she is maintained on lithium, which is the primary source of her tremor.  When she has been off of  lithium in the past, her tremor has been much improved but mood was markedly worse (near catatonic).  She is now off of Zyprexa, which she was on last visit.  Rexulti was tried since last visit but  made her catatonic.  They see [name] tomorrow and will leave for [facility] on Sunday.  However, she will be coming back every 2-3 weeks for ECT.   She is on primidone, 100 mg in the morning and 50 mg at night.   Overall, the patients nurse states that while tremor comes and goes with fatigue and anxiety, it has really been better with the course of time.   She just recently saw Dr. Brett Fairy for a second opinion for her sleep apnea.  She is to have a titration study at home with [employer].  She saw cardiology in August.  She had a syncopal episode.  This was the day after her heart catheterization.  She is now off of antihypertensives.  It was felt that the syncopal episode was likely orthostatic.  She has had gait instability and has had falls.  She doesn't wish to do PT.  She does daily PT.  She doesn't like to use her walker (states that her husband doesn't like her to use it).    08/14/18 update: Patient seen today for follow-up for tremor.  She is accompanied by a friend and also her nurse, both of who supplement the hx.   It has been about a year since I have seen her.  [facility] records are reviewed.  She has had multiple ECTs treatment since her last visit.  She is also in the hospital in June.  She had a pacemaker placed for symptomatic bradycardia.  Pt/family state that "she was like a new person overnight."  She has so much more energy.  Mood has been really good.  Danced at a wedding this weekend.  Remains on lithium 300 mg q hs.  On primidone 50 mg, 1 q am, 2 po q hs.   PREVIOUS MEDICATIONS: Inderal (bradycardia), Topamax, clonazepam  ALLERGIES:   Allergies  Allergen Reactions  . Propranolol Other (See Comments)    Low blood pressure  . Brexpiprazole Other (See Comments)    Aphasia and catatonia     CURRENT MEDICATIONS:  Current Outpatient Medications on File Prior to Visit  Medication Sig Dispense Refill  . acetaminophen (TYLENOL) 500 MG tablet Take 1,000 mg by mouth every 6 (six) hours as needed for mild pain or headache.     Marland Kitchen aspirin EC 81 MG tablet Take 1 tablet (81 mg total) by mouth daily. 90 tablet 3  . Calcium Carbonate-Vitamin D (CALCIUM 600/VITAMIN D PO) Take 1 tablet by mouth 2 (two) times daily.     . cetirizine (ZYRTEC) 10 MG tablet Take 10 mg by mouth daily as needed for allergies.     . Cholecalciferol (VITAMIN D-3) 5000 units TABS Take 5,000 Units by mouth daily with breakfast.     . cloNIDine (CATAPRES) 0.1 MG tablet Take 1 tablet (0.1 mg total) by mouth daily as needed (Take before ECT treatments.). 10 tablet 1  . L-Methylfolate-B12-B6-B2 (CEREFOLIN PO) Take by mouth.    . lactulose (CHRONULAC) 10 GM/15ML solution TAKE 15 ML BY MOUTH DAILY AS NEEDED FOR MILD CONSTIPATION 1350 mL 1  . Lavender Oil 80 MG CAPS Take 160 mg by mouth at bedtime.    Marland Kitchen levothyroxine (SYNTHROID, LEVOTHROID) 75 MCG tablet TAKE 1 TABLET(75 MCG) BY MOUTH DAILY 90 tablet 3  . lithium carbonate 300 MG capsule Take 300 mg by mouth at bedtime.    Marland Kitchen LORazepam (ATIVAN) 0.5 MG tablet Take 0.25 mg by mouth at bedtime.     . memantine (NAMENDA) 10 MG tablet  Take 10 mg by mouth 2 (two) times daily.    . mirtazapine (REMERON) 7.5 MG tablet Take 7.5 mg by mouth at bedtime.     . Multiple Vitamins-Minerals (MULTIVITAMIN WITH MINERALS) tablet Take 1 tablet by mouth daily.      . nortriptyline (PAMELOR) 25 MG capsule Take 50 mg by mouth at bedtime.     . Omega-3 Fatty Acids (OMEGA 3 PO) Take 1 capsule by mouth at bedtime.    Marland Kitchen omeprazole (PRILOSEC) 40 MG capsule Take 1 capsule (40 mg total) by mouth daily before supper.    Marland Kitchen OVER THE COUNTER MEDICATION Take 1 tablet by mouth 2 (two) times daily. *Cocavia*    . PARoxetine (PAXIL) 20 MG tablet Take 20 mg by mouth at bedtime.     . Pyridoxine HCl (VITAMIN B-6)  500 MG tablet Take 500 mg by mouth 2 (two) times daily.    . ranitidine (ZANTAC) 150 MG tablet TAKE 1 TABLET(150 MG) BY MOUTH AT BEDTIME (Patient taking differently: daily as needed. ) 30 tablet 0  . rosuvastatin (CRESTOR) 40 MG tablet TAKE 1 TABLET BY MOUTH DAILY 90 tablet 0  . sennosides-docusate sodium (SENOKOT-S) 8.6-50 MG tablet Take 1 tablet by mouth at bedtime.    . TOVIAZ 8 MG TB24 tablet TAKE 1 TABLET BY MOUTH DAILY 90 tablet 0  . vitamin B-12 (CYANOCOBALAMIN) 1000 MCG tablet Take 1,000 mcg by mouth at bedtime.    . nitroGLYCERIN (NITROSTAT) 0.4 MG SL tablet Place 1 tablet (0.4 mg total) under the tongue every 5 (five) minutes as needed for chest pain. (Patient not taking: Reported on 08/14/2018) 25 tablet 3   No current facility-administered medications on file prior to visit.     PAST MEDICAL HISTORY:   Past Medical History:  Diagnosis Date  . Anxiety   . Arthritis    "hips, spine" (03/17/2018)  . Bipolar II disorder ([place])   . Chronic bronchitis ([place])   . Chronic lower back pain   . Chronic right hip pain   . CKD (chronic kidney disease), stage II   . Esophagitis, erosive   . GAD (generalized anxiety disorder)   . GERD (gastroesophageal reflux disease)   . Headache    "maybe monthly" (03/17/2018))  . Heart murmur, systolic   . History of adenomatous polyp of colon    08-04-2016  tubular adenoma  . History of blood transfusion 12/2017   "related to vascular hematoma"  . History of electroconvulsive therapy    at [facility]--  started 04-15-2015 to 11-17-2016  total greater than 40 times  . History of hiatal hernia   . Hyperlipidemia   . Hypertension   . Hypothyroidism   . Internal carotid artery stenosis, bilateral    per last duplex 05-01-2014  bilateral ICA 40-59%  . Major depression, chronic    ECT treatments extensive and multiple started 07/ 2016  . Memory loss    "both short and long-term; needs frequent reminders to follow instrucitons" (05/16/2017)  . Migraines     "none in years" (03/17/2018)  . OSA (obstructive sleep apnea)    per study 06/ 2012 moderate OSA  ; "refuses to wear masks" (03/17/2018)  . Osteoporosis   . Pulmonary nodule    monitored by pcp  . S/P placement of cardiac pacemaker 03/17/18 [facility]  03/18/2018    PAST SURGICAL HISTORY:   Past Surgical History:  Procedure Laterality Date  . APPENDECTOMY  1971  . AUGMENTATION MAMMAPLASTY Bilateral   . CARDIOVASCULAR  STRESS TEST  01-30-2015  dr Aundra Dubin   Low risk nuclear study w/ no evidence ischemia or infarction/  normal LV funciton and wall motion , 76%  . COLONOSCOPY W/ BIOPSIES AND POLYPECTOMY  "multiple"  . COLONOSCOPY WITH ESOPHAGOGASTRODUODENOSCOPY (EGD)  last one 08-04-2016  . CORONARY ANGIOPLASTY WITH STENT PLACEMENT  05/16/2017   "LAD"  . CORONARY STENT INTERVENTION N/A 05/16/2017   Procedure: CORONARY STENT INTERVENTION;  Surgeon: Burnell Blanks, MD;  Location: [facility];  Service: Cardiovascular;  Laterality: N/A;  . ESOPHAGOGASTRODUODENOSCOPY  02-26-04  . ESOPHAGOGASTRODUODENOSCOPY  "multiple"  . INSERT / REPLACE / REMOVE PACEMAKER  03/17/2018  . LEFT HEART CATH AND CORONARY ANGIOGRAPHY N/A 05/16/2017   Procedure: LEFT HEART CATH AND CORONARY ANGIOGRAPHY;  Surgeon: Larey Dresser, MD;  Location: [facility] [facility];  Service: Cardiovascular;  Laterality: N/A;  . OVARIAN CYST SURGERY  1970s   Laparotomy   . PACEMAKER IMPLANT N/A 03/17/2018   Procedure: PACEMAKER IMPLANT;  Surgeon: Evans Lance, MD;  Location: [place] [facility];  Service: Cardiovascular;  Laterality: N/A;  . PORT-A-CATH PLACEMENT  05/31/2016; 2018   "@ [facility]; for ECT series"; "@ [facility] also"  . PORT-A-CATH REMOVAL N/A 03/11/2017   Procedure: REMOVAL PORT-A-CATH;  Surgeon: Jackolyn Confer, MD;  Location: [facility];  Service: General;  Laterality: N/A;  . PORTA CATH REMOVAL  03/17/2018  . PORTA CATH REMOVAL  03/17/2018   Procedure: PORTA CATH REMOVAL;  Surgeon: Evans Lance,  MD;  Location: [place] [facility];  Service: Cardiovascular;;  . TRANSTHORACIC ECHOCARDIOGRAM  09/05/2013  dr Aundra Dubin   mild LVH, ef 20-35%, grade 1 diastolic dysfunction/  very mild AV stenosis with mild AR/  trivial MR and PT/ mild to moderate LAE/ mild TR/ mild pulmonary hypertension with PA peak pressure 3mmHg  . TUBAL LIGATION    . VAGINAL HYSTERECTOMY     "partial"    SOCIAL HISTORY:   Social History   Socioeconomic History  . Marital status: Married    Spouse name: Dr. Lyla Son  . Number of children: 2  . Years of education: Not on file  . Highest education level: Not on file  Occupational History  . Occupation: housewife    Employer: UNEMPLOYED  Social Needs  . Financial resource strain: Not on file  . Food insecurity:    Worry: Not on file    Inability: Not on file  . Transportation needs:    Medical: Not on file    Non-medical: Not on file  Tobacco Use  . Smoking status: Former Smoker    Packs/day: 2.00    Years: 15.00    Pack years: 30.00    Types: Cigarettes    Last attempt to quit: 01/13/1971    Years since quitting: 47.6  . Smokeless tobacco: Never Used  Substance and Sexual Activity  . Alcohol use: Yes    Alcohol/week: 0.0 standard drinks    Comment: 03/17/2018 "nothing in years"  . Drug use: Never  . Sexual activity: Not Currently    Comment: intercourse age 28, sexual partners less than 5  Lifestyle  . Physical activity:    Days per week: Not on file    Minutes per session: Not on file  . Stress: Not on file  Relationships  . Social connections:    Talks on phone: Not on file    Gets together: Not on file    Attends religious service: Not on file  Active member of club or organization: Not on file    Attends meetings of clubs or organizations: Not on file    Relationship status: Not on file  . Intimate partner violence:    Fear of current or ex partner: Not on file    Emotionally abused: Not on file    Physically abused: Not on file     Forced sexual activity: Not on file  Other Topics Concern  . Not on file  Social History Narrative  . Not on file    FAMILY HISTORY:   Family Status  Relation Name Status  . Father  Deceased       CAD  . Mother  Deceased       COPD  . Brother  Alive       skin CA (? type)  . Child  Alive       2, healthy  . PGM  (Not Specified)  . Ethlyn Daniels  (Not Specified)  . Neg Hx  (Not Specified)    ROS:    Review of Systems  Constitutional: Negative.   HENT: Negative.   Eyes: Negative.   Respiratory: Negative.   Cardiovascular: Negative.   Gastrointestinal: Negative.   Genitourinary: Negative.   Musculoskeletal: Negative.   Skin: Negative.     PHYSICAL EXAMINATION:    VITALS:   Vitals:   08/14/18 1115  BP: 128/70  Pulse: 64  SpO2: 98%  Weight: 104 lb (47.2 kg)  Height: 4\' 10"  (1.473 m)   Wt Readings from Last 3 Encounters:  08/14/18 104 lb (47.2 kg)  06/20/18 100 lb (45.4 kg)  06/12/18 100 lb (45.4 kg)     GEN:  The patient appears stated age and is in NAD.  She is jovial and laughing HEENT:  Normocephalic, atraumatic.  The mucous membranes are moist. The superficial temporal arteries are without ropiness or tenderness. CV:  RRR Lungs:  CTAB Neck/HEME:  There are no carotid bruits bilaterally.  Neurological examination:  Orientation: The patient is alert and oriented x3.  She does look to her friends for details of the history. Cranial nerves: There is good facial symmetry. The speech is fluent and clear. Soft palate rises symmetrically and there is no tongue deviation. Hearing is intact to conversational tone. Sensation: Sensation is intact to light touch throughout Motor: Strength is 5/5 in the bilateral upper and lower extremities.   Shoulder shrug is equal and symmetric.  There is no pronator drift.  Movement examination: Tone: There is normal tone in the upper and lower cavities. Abnormal movements: There is minimal postural tremor.  This increases slightly  with intention.  Mild trouble with Archimedes spirals. Coordination:  There is no decremation with RAM's, with any form of RAMS, including alternating supination and pronation of the forearm, hand opening and closing, finger taps, heel taps and toe taps. Gait and Station: The patient just slightly drags the right leg.  LABS  Lab Results  Component Value Date   WBC 5.3 06/12/2018   HGB 11.6 (L) 06/12/2018   HCT 38.2 06/12/2018   MCV 97.2 06/12/2018   PLT 215 06/12/2018     Chemistry      Component Value Date/Time   NA 136 06/12/2018 1518   NA 136 04/20/2017 1151   K 4.1 06/12/2018 1518   CL 101 06/12/2018 1518   CO2 25 06/12/2018 1518   BUN 39 (H) 06/12/2018 1518   BUN 24 04/20/2017 1151   CREATININE 0.89 06/12/2018 1518  CREATININE 0.96 (H) 05/02/2018 1607      Component Value Date/Time   CALCIUM 9.6 06/12/2018 1518   ALKPHOS 46 08/08/2017 1615   AST 52 (H) 04/11/2018 1159   ALT 75 (H) 04/11/2018 1159   BILITOT 0.3 04/11/2018 1159   BILITOT <0.2 04/20/2017 1151     Lab Results  Component Value Date   LITHIUM 0.5 (L) 07/04/2018   NA 136 06/12/2018   BUN 39 (H) 06/12/2018   CREATININE 0.89 06/12/2018   TSH 1.91 04/11/2018   WBC 5.3 06/12/2018     ASSESSMENT/PLAN:  1.  Tremor.   -Due to lithium.  Went off lithium in the past, tremor went away.  Her tremor is actually not very bad.  She thinks that primidone helps.  She will remain on primidone, 50 mg in the morning and 100 mg at night.  This was refilled today. 2.  Memory changes.    -hx of excessive EtOH but no longer drinking  -ECT has affected her memory 3.  Syncope  -is now s/p PPM.  Doing much better in terms of energy. 4.  Major depressive disorder  -undergoing frequent ECT.  Feels much better.  -She is maintained on lithium.  5.  Follow-up 1 year.

## 2018-08-14 ENCOUNTER — Encounter: Payer: Self-pay | Admitting: Neurology

## 2018-08-14 ENCOUNTER — Ambulatory Visit (INDEPENDENT_AMBULATORY_CARE_PROVIDER_SITE_OTHER): Payer: Medicare Other | Admitting: Neurology

## 2018-08-14 VITALS — BP 128/70 | HR 64 | Ht <= 58 in | Wt 104.0 lb

## 2018-08-14 DIAGNOSIS — I200 Unstable angina: Secondary | ICD-10-CM

## 2018-08-14 DIAGNOSIS — G251 Drug-induced tremor: Secondary | ICD-10-CM | POA: Diagnosis not present

## 2018-08-14 DIAGNOSIS — F331 Major depressive disorder, recurrent, moderate: Secondary | ICD-10-CM

## 2018-08-14 MED ORDER — PRIMIDONE 50 MG PO TABS: ORAL_TABLET | ORAL | 3 refills | Status: DC

## 2018-08-14 NOTE — Patient Instructions (Signed)
You look great!!!

## 2018-08-21 ENCOUNTER — Ambulatory Visit (INDEPENDENT_AMBULATORY_CARE_PROVIDER_SITE_OTHER): Payer: Medicare Other | Admitting: Psychiatry

## 2018-08-21 ENCOUNTER — Ambulatory Visit (HOSPITAL_COMMUNITY)
Admission: RE | Admit: 2018-08-21 | Discharge: 2018-08-21 | Disposition: A | Discharge status: Home / Self Care | Payer: Medicare Other | Source: Ambulatory Visit | Attending: Cardiology | Admitting: Cardiology

## 2018-08-21 ENCOUNTER — Encounter: Payer: Self-pay | Admitting: Psychiatry

## 2018-08-21 ENCOUNTER — Other Ambulatory Visit: Payer: Self-pay | Admitting: Internal Medicine

## 2018-08-21 ENCOUNTER — Other Ambulatory Visit (HOSPITAL_COMMUNITY): Payer: Self-pay

## 2018-08-21 ENCOUNTER — Encounter (HOSPITAL_COMMUNITY): Payer: Self-pay | Admitting: Cardiology

## 2018-08-21 VITALS — HR 63 | Wt 104.6 lb

## 2018-08-21 DIAGNOSIS — R251 Tremor, unspecified: Secondary | ICD-10-CM | POA: Diagnosis not present

## 2018-08-21 DIAGNOSIS — F3181 Bipolar II disorder: Secondary | ICD-10-CM | POA: Diagnosis not present

## 2018-08-21 DIAGNOSIS — I2510 Atherosclerotic heart disease of native coronary artery without angina pectoris: Secondary | ICD-10-CM | POA: Insufficient documentation

## 2018-08-21 DIAGNOSIS — G251 Drug-induced tremor: Secondary | ICD-10-CM | POA: Diagnosis not present

## 2018-08-21 DIAGNOSIS — Z8249 Family history of ischemic heart disease and other diseases of the circulatory system: Secondary | ICD-10-CM | POA: Insufficient documentation

## 2018-08-21 DIAGNOSIS — I351 Nonrheumatic aortic (valve) insufficiency: Secondary | ICD-10-CM | POA: Insufficient documentation

## 2018-08-21 DIAGNOSIS — Z79899 Other long term (current) drug therapy: Secondary | ICD-10-CM | POA: Diagnosis not present

## 2018-08-21 DIAGNOSIS — Z79890 Hormone replacement therapy (postmenopausal): Secondary | ICD-10-CM | POA: Insufficient documentation

## 2018-08-21 DIAGNOSIS — Z87891 Personal history of nicotine dependence: Secondary | ICD-10-CM | POA: Diagnosis not present

## 2018-08-21 DIAGNOSIS — K219 Gastro-esophageal reflux disease without esophagitis: Secondary | ICD-10-CM | POA: Insufficient documentation

## 2018-08-21 DIAGNOSIS — G4733 Obstructive sleep apnea (adult) (pediatric): Secondary | ICD-10-CM | POA: Insufficient documentation

## 2018-08-21 DIAGNOSIS — F319 Bipolar disorder, unspecified: Secondary | ICD-10-CM | POA: Diagnosis not present

## 2018-08-21 DIAGNOSIS — Z950 Presence of cardiac pacemaker: Secondary | ICD-10-CM | POA: Insufficient documentation

## 2018-08-21 DIAGNOSIS — I200 Unstable angina: Secondary | ICD-10-CM | POA: Diagnosis not present

## 2018-08-21 DIAGNOSIS — Z7982 Long term (current) use of aspirin: Secondary | ICD-10-CM | POA: Diagnosis not present

## 2018-08-21 DIAGNOSIS — F339 Major depressive disorder, recurrent, unspecified: Secondary | ICD-10-CM

## 2018-08-21 DIAGNOSIS — F0390 Unspecified dementia without behavioral disturbance: Secondary | ICD-10-CM | POA: Diagnosis not present

## 2018-08-21 DIAGNOSIS — E785 Hyperlipidemia, unspecified: Secondary | ICD-10-CM | POA: Insufficient documentation

## 2018-08-21 DIAGNOSIS — R001 Bradycardia, unspecified: Secondary | ICD-10-CM | POA: Diagnosis not present

## 2018-08-21 DIAGNOSIS — I6523 Occlusion and stenosis of bilateral carotid arteries: Secondary | ICD-10-CM | POA: Diagnosis not present

## 2018-08-21 DIAGNOSIS — I10 Essential (primary) hypertension: Secondary | ICD-10-CM | POA: Diagnosis not present

## 2018-08-21 MED ORDER — MIRTAZAPINE 7.5 MG PO TABS: 7.5000 mg | ORAL_TABLET | Freq: Every day | ORAL | 3 refills | Status: DC

## 2018-08-21 MED ORDER — MEMANTINE HCL 10 MG PO TABS: 10.0000 mg | ORAL_TABLET | Freq: Two times a day (BID) | ORAL | 3 refills | Status: DC

## 2018-08-21 MED ORDER — NORTRIPTYLINE HCL 25 MG PO CAPS: 50.0000 mg | ORAL_CAPSULE | Freq: Every day | ORAL | 3 refills | Status: DC

## 2018-08-21 MED ORDER — LORAZEPAM 0.5 MG PO TABS: 0.2500 mg | ORAL_TABLET | Freq: Three times a day (TID) | ORAL | 1 refills | Status: DC | PRN

## 2018-08-21 MED ORDER — ROSUVASTATIN CALCIUM 40 MG PO TABS: 40.0000 mg | ORAL_TABLET | Freq: Every day | ORAL | 0 refills | Status: DC

## 2018-08-21 MED ORDER — PAROXETINE HCL 20 MG PO TABS: 20.0000 mg | ORAL_TABLET | Freq: Every day | ORAL | 3 refills | Status: DC

## 2018-08-21 MED ORDER — LITHIUM CARBONATE 300 MG PO CAPS: 300.0000 mg | ORAL_CAPSULE | Freq: Every day | ORAL | 3 refills | Status: DC

## 2018-08-21 NOTE — Patient Instructions (Addendum)
Return in about 6 months (around 02/19/2019). We will call to set up an appointment in in Feb, if do not hear anything from [name] please give our office a call

## 2018-08-21 NOTE — Progress Notes (Signed)
Meghan Oliver [id number] 1941-06-16 77 y.o.  Subjective:   Patient ID:  Meghan Oliver is a 77 y.o. (DOB Mar 13, 1941) female.  Chief Complaint:  Chief Complaint  Patient presents with  . Depression  . Medication Problem    tremor  . Memory Loss    HPI Meghan Oliver presents to the office today for follow-up of Severe TR bipolar depression with psychotic features including catatonia.  Going to [facility] for the winter next week.  No significant depression since here.  1 trend down per nurse and then she had ECT.  ECT frequency about 6 weeks.  Gets very anxious the day of the ECT.  ECT consistently for a year.  Best in mood in 7-8 years.  Also the pacemaker helped.  Friends notice the benefit.   Going on family cruise and expects some conflict between the kids.'s questions about how to handle that as she has some anxiety about it.  Patient reports stable mood and denies depressed or irritable moods.  Patient denies any recent difficulty with anxiety except as noted.  Patient denies difficulty with sleep initiation or maintenance. Denies appetite disturbance.  Patient reports that energy and motivation have been good.  Patient denies any difficulty with concentration.  Patient denies any suicidal ideation.  She has a past history of moderate drinking but which caused depression.  She is not drinking alcohol now.  She is failed multiple drugs in the past including Symbyax, Rexulti, lamotrigine, Abilify, pramipexole, Vraylar Latuda, and many many others.  Saw Dr. Carles Collet last week and no change in neuro meds.  Review of Systems:  Review of Systems  Neurological: Negative for tremors and weakness.  Psychiatric/Behavioral: Negative for agitation, behavioral problems, confusion, decreased concentration, dysphoric mood, hallucinations, self-injury, sleep disturbance and suicidal ideas. The patient is not nervous/anxious and is not hyperactive.     Medications: I have reviewed the patient's current  medications.  Current Outpatient Medications  Medication Sig Dispense Refill  . acetaminophen (TYLENOL) 500 MG tablet Take 1,000 mg by mouth every 6 (six) hours as needed for mild pain or headache.     . Calcium Carbonate-Vitamin D (CALCIUM 600/VITAMIN D PO) Take 1 tablet by mouth 2 (two) times daily.     . Cholecalciferol (VITAMIN D-3) 5000 units TABS Take 5,000 Units by mouth daily with breakfast.     . cloNIDine (CATAPRES) 0.1 MG tablet Take 1 tablet (0.1 mg total) by mouth daily as needed (Take before ECT treatments.). 10 tablet 1  . L-Methylfolate-B12-B6-B2 (CEREFOLIN PO) Take by mouth.    . lithium carbonate 300 MG capsule Take 1 capsule (300 mg total) by mouth at bedtime. 90 capsule 3  . LORazepam (ATIVAN) 0.5 MG tablet Take 0.5 tablets (0.25 mg total) by mouth every 8 (eight) hours as needed for anxiety. 90 tablet 1  . memantine (NAMENDA) 10 MG tablet Take 1 tablet (10 mg total) by mouth 2 (two) times daily. 180 tablet 3  . mirtazapine (REMERON) 7.5 MG tablet Take 1 tablet (7.5 mg total) by mouth at bedtime. 90 tablet 3  . Multiple Vitamins-Minerals (MULTIVITAMIN WITH MINERALS) tablet Take 1 tablet by mouth daily.      . nortriptyline (PAMELOR) 25 MG capsule Take 2 capsules (50 mg total) by mouth at bedtime. 180 capsule 3  . omeprazole (PRILOSEC) 40 MG capsule Take 1 capsule (40 mg total) by mouth daily before supper.    Marland Kitchen PARoxetine (PAXIL) 20 MG tablet Take 1 tablet (20 mg total) by mouth  at bedtime. 90 tablet 3  . primidone (MYSOLINE) 50 MG tablet Take 100 mg by mouth in the morning and take 50 mg by mouth at bedtime 270 tablet 3  . Pyridoxine HCl (VITAMIN B-6) 500 MG tablet Take 500 mg by mouth 2 (two) times daily.    . rosuvastatin (CRESTOR) 40 MG tablet Take 1 tablet (40 mg total) by mouth daily. 90 tablet 0  . sennosides-docusate sodium (SENOKOT-S) 8.6-50 MG tablet Take 1 tablet by mouth at bedtime.    . TOVIAZ 8 MG TB24 tablet TAKE 1 TABLET BY MOUTH DAILY 90 tablet 3  . aspirin  EC 81 MG tablet Take 1 tablet (81 mg total) by mouth daily. 90 tablet 3  . cetirizine (ZYRTEC) 10 MG tablet Take 10 mg by mouth daily as needed for allergies.     Marland Kitchen lactulose (CHRONULAC) 10 GM/15ML solution TAKE 15 ML BY MOUTH DAILY AS NEEDED FOR MILD CONSTIPATION 1350 mL 1  . Lavender Oil 80 MG CAPS Take 160 mg by mouth at bedtime.    Marland Kitchen levothyroxine (SYNTHROID, LEVOTHROID) 75 MCG tablet TAKE 1 TABLET(75 MCG) BY MOUTH DAILY 90 tablet 3  . nitroGLYCERIN (NITROSTAT) 0.4 MG SL tablet Place 1 tablet (0.4 mg total) under the tongue every 5 (five) minutes as needed for chest pain. 25 tablet 3  . Omega-3 Fatty Acids (OMEGA 3 PO) Take 1 capsule by mouth at bedtime.    Marland Kitchen OVER THE COUNTER MEDICATION Take 1 tablet by mouth 2 (two) times daily. *Cocavia*    . ranitidine (ZANTAC) 150 MG tablet TAKE 1 TABLET(150 MG) BY MOUTH AT BEDTIME (Patient taking differently: daily as needed. ) 30 tablet 0  . vitamin B-12 (CYANOCOBALAMIN) 1000 MCG tablet Take 1,000 mcg by mouth at bedtime.     No current facility-administered medications for this visit.     Medication Side Effects: Other: tremor  Allergies:  Allergies  Allergen Reactions  . Propranolol Other (See Comments)    Low blood pressure  . Brexpiprazole Other (See Comments)    Aphasia and catatonia    Past Medical History:  Diagnosis Date  . Anxiety   . Arthritis    "hips, spine" (03/17/2018)  . Bipolar II disorder ([place])   . Chronic bronchitis ([place])   . Chronic lower back pain   . Chronic right hip pain   . CKD (chronic kidney disease), stage II   . Esophagitis, erosive   . GAD (generalized anxiety disorder)   . GERD (gastroesophageal reflux disease)   . Headache    "maybe monthly" (03/17/2018))  . Heart murmur, systolic   . History of adenomatous polyp of colon    08-04-2016  tubular adenoma  . History of blood transfusion 12/2017   "related to vascular hematoma"  . History of electroconvulsive therapy    at [facility]--  started 04-15-2015 to  11-17-2016  total greater than 40 times  . History of hiatal hernia   . Hyperlipidemia   . Hypertension   . Hypothyroidism   . Internal carotid artery stenosis, bilateral    per last duplex 05-01-2014  bilateral ICA 40-59%  . Major depression, chronic    ECT treatments extensive and multiple started 07/ 2016  . Memory loss    "both short and long-term; needs frequent reminders to follow instrucitons" (05/16/2017)  . Migraines    "none in years" (03/17/2018)  . OSA (obstructive sleep apnea)    per study 06/ 2012 moderate OSA  ; "refuses to wear masks" (03/17/2018)  .  Osteoporosis   . Pulmonary nodule    monitored by pcp  . S/P placement of cardiac pacemaker 03/17/18 ST Jude  03/18/2018    Family History  Problem Relation Age of Onset  . Heart attack Father 75       deceased  . Hypertension Father   . Heart disease Father   . Breast cancer Paternal Grandmother        Age unknown  . Breast cancer Paternal Aunt        Age 31's  . Colon cancer Neg Hx     Social History   Socioeconomic History  . Marital status: Married    Spouse name: Dr. Lyla Son  . Number of children: 2  . Years of education: Not on file  . Highest education level: Not on file  Occupational History  . Occupation: housewife    Employer: UNEMPLOYED  Social Needs  . Financial resource strain: Not on file  . Food insecurity:    Worry: Not on file    Inability: Not on file  . Transportation needs:    Medical: Not on file    Non-medical: Not on file  Tobacco Use  . Smoking status: Former Smoker    Packs/day: 2.00    Years: 15.00    Pack years: 30.00    Types: Cigarettes    Last attempt to quit: 01/13/1971    Years since quitting: 47.6  . Smokeless tobacco: Never Used  Substance and Sexual Activity  . Alcohol use: Yes    Alcohol/week: 0.0 standard drinks    Comment: 03/17/2018 "nothing in years"  . Drug use: Never  . Sexual activity: Not Currently    Comment: intercourse age 25, sexual partners  less than 5  Lifestyle  . Physical activity:    Days per week: Not on file    Minutes per session: Not on file  . Stress: Not on file  Relationships  . Social connections:    Talks on phone: Not on file    Gets together: Not on file    Attends religious service: Not on file    Active member of club or organization: Not on file    Attends meetings of clubs or organizations: Not on file    Relationship status: Not on file  . Intimate partner violence:    Fear of current or ex partner: Not on file    Emotionally abused: Not on file    Physically abused: Not on file    Forced sexual activity: Not on file  Other Topics Concern  . Not on file  Social History Narrative  . Not on file    Past Medical History, Surgical history, Social history, and Family history were reviewed and updated as appropriate.   Please see review of systems for further details on the patient's review from today.   Objective:   Physical Exam:  LMP  (LMP Unknown)   Physical Exam  Constitutional: She is oriented to person, place, and time. She appears well-developed. No distress.  Musculoskeletal: She exhibits no deformity.  Neurological: She is alert and oriented to person, place, and time. She displays no tremor. Coordination abnormal.  Psychiatric: She has a normal mood and affect. Her behavior is normal. Judgment and thought content normal. Her mood appears not anxious. Her affect is not angry, not blunt, not labile and not inappropriate. Her speech is not slurred. She is not slowed and not withdrawn. She does not exhibit a depressed mood. She expresses no  homicidal and no suicidal ideation. She expresses no suicidal plans and no homicidal plans. She exhibits abnormal recent memory.  Insight intact. No auditory or visual hallucinations.  Still some STM problems but it's much better than in the past.  She is attentive.    Lab Review:     Component Value Date/Time   NA 136 06/12/2018 1518   NA 136  04/20/2017 1151   K 4.1 06/12/2018 1518   CL 101 06/12/2018 1518   CO2 25 06/12/2018 1518   GLUCOSE 84 06/12/2018 1518   BUN 39 (H) 06/12/2018 1518   BUN 24 04/20/2017 1151   CREATININE 0.89 06/12/2018 1518   CREATININE 0.96 (H) 05/02/2018 1607   CALCIUM 9.6 06/12/2018 1518   PROT 7.1 04/11/2018 1159   PROT 6.2 04/20/2017 1151   ALBUMIN 3.9 08/08/2017 1615   ALBUMIN 3.9 04/20/2017 1151   AST 52 (H) 04/11/2018 1159   ALT 75 (H) 04/11/2018 1159   ALKPHOS 46 08/08/2017 1615   BILITOT 0.3 04/11/2018 1159   BILITOT <0.2 04/20/2017 1151   GFRNONAA >60 06/12/2018 1518   GFRNONAA 55 (L) 04/11/2018 1159   GFRAA >60 06/12/2018 1518   GFRAA 64 04/11/2018 1159       Component Value Date/Time   WBC 5.3 06/12/2018 1518   RBC 3.93 06/12/2018 1518   HGB 11.6 (L) 06/12/2018 1518   HGB 10.2 (L) 04/20/2017 1151   HCT 38.2 06/12/2018 1518   HCT 29.6 (L) 04/20/2017 1151   PLT 215 06/12/2018 1518   PLT 385 (H) 04/20/2017 1151   MCV 97.2 06/12/2018 1518   MCV 89 04/20/2017 1151   MCH 29.5 06/12/2018 1518   MCHC 30.4 06/12/2018 1518   RDW 13.0 06/12/2018 1518   RDW 13.9 04/20/2017 1151   LYMPHSABS 1,475 04/11/2018 1159   LYMPHSABS 1.5 04/20/2017 1151   MONOABS 0.5 03/09/2017 1232   EOSABS 89 04/11/2018 1159   EOSABS 0.3 04/20/2017 1151   BASOSABS 18 04/11/2018 1159   BASOSABS 0.0 04/20/2017 1151    Lithium Lvl  Date Value Ref Range Status  07/04/2018 0.5 (L) 0.6 - 1.2 mmol/L Final  Nortriptyline level 51 on 50 mg nightly with paroxetine 20 mg daily  No results found for: PHENYTOIN, PHENOBARB, VALPROATE, CBMZ   .res Assessment: Plan:    Bipolar II disorder ([place])  Lithium-induced tremor  Recurrent major depression resistant to treatment ([place])   Greater than 50% of face to face time with patient was spent on counseling and coordination of care.  Her depression is currently in remission.  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   During the severe depression she has catatonia.  She is required ECT.  The last episode of depression approximately 10 years stayed in remission on Paxil nortriptyline and lithium for a period of years until the lithium level was decreased due to tremor.  This time we are maintaining an adequate lithium level.  We did discuss the drug interaction issues between fluoxetine and nortriptyline.  Her nortriptyline blood level was within the normal range.  We discussed the necessary polypharmacy.  She seems to be tolerating the meds well other than the lithium tremor which is being managed with primidone.  She is continuing under the care of her neurologist Dr. Carles Collet.  Disc the risk of the alcohol for her relapse of depression.   Keep up q 6 week ECT as it's helping.  Option increase in the nortriptyline if needed.  That should probably  be done if there   Supportive and problem solving oriented psychotherapy around dealing with conflict between the adult daughters.  This appointment was 40 minutes.  Follow-up 2 months  Lynder Parents MD, DFAPA Please see After Visit Summary for patient specific instructions.  Future Appointments  Date Time Provider [place]  08/25/2018  2:15 PM Evans Lance, MD [facility]  11/30/2018  1:00 PM Cottle, Billey Co., MD [facility] None  08/15/2019 11:15 AM Tat, Eustace Quail, DO [facility] None    No orders of the defined types were placed in this encounter.     -------------------------------

## 2018-08-22 ENCOUNTER — Other Ambulatory Visit: Payer: Self-pay | Admitting: Gastroenterology

## 2018-08-22 NOTE — Progress Notes (Signed)
Patient ID: Meghan Oliver, female   DOB: Jul 12, 1941, 77 y.o.   MRN: [id number] PCP: Dr. Renold Genta Cardiology: Dr. Aundra Dubin  77 y.o. with history of bipolar disorder, carotid stenosis, HTN, CAD, and hyperlipidemia presents for followup of CAD. She had DES to pLAD in 8/18.  She has severe depression and gets periodic ECT at [facility]. Last echo in 6/19 showed EF 65-70% with mild-moderate AI and mild to moderate MR.   For several years, she had episodes of burning in her lower neck , sometimes radiating up.  These episodes had no particular trigger (not meals or exercise).  In the past, her family had thought they represented GERD and she had been on a PPI and H-2 blocker as well as Mylanta prn.  She had a particularly bad episode in 7/18 and went to the ER.  She had a coronary CTA that showed extensive calcification in the proximal to mid LAD, no definite severe stenosis but cannot rule out.  The study quality was not good enough to interpret by FFR.  Therefore, I took her for coronary angiography in 8/18.  This showed a 90% proximal LAD stenosis that was treated with DES.  Unfortunately, coronary intervention did not stop her symptoms.  She continued to have the same periodic burning sensation in chest and lower neck, not exertional.  This finally seems to have mostly stopped.   She developed symptomatic bradycardia with sinus node dysfunction with junctional escape rhythm.  St Jude PPM was placed in 6/19 with removal of her portacath.   Atypical chest pain in 9/19, Cardiolite showed no ischemia.   She has been doing well. No further chest pain.  No lightheadedness.  No significant exertional dyspnea.  No orthopnea/PND.  She is going to be going to [place] for 4 months soon. Weight stable.  ECT has been cut back to every 6 wks.   Device interrogation: 84% a-paced.   Labs (4/16): K 3.3, creatinine 0.76 Labs (7/18): K 4.4, creatinine 1.02 Labs (8/18): LDL 81, HDL 119, creatinine 0.99, hgb 10.8 Labs (10/18): K  3.6, creatinine 0.77, hgb 10 Labs (7/19): creatinine 0.96  PMH: 1. GERD 2. Bipolar disorder: On lithium. History of ECT.  3. HTN 4. Tremor 5. Hyperlipidemia 6. Carotid stenosis: Carotid dopplers (7/14) with 40-59% bilateral ICA stenosis.  Carotid dopplers (7/15) with 40-59% BICA stenosis.  - Carotid dopplers (8/18): Mild BICA stenosis.  - Carotid dopplers (8/19): Mild BICA stenosis.  7. CAD: Coronary calcium seen on CT.   - ETT (7/13) with 5'45" exercise, 71% MPHR (wanted to stop), no ischemic ECG changes but did not reach target heart rate.  - Lexiscan Cardiolite (4/16) with EF 76%, no ischemia/infarction.  - Coronary CTA (7/18): Interpretation limited by artifact.  Extensive calcification in the proximal to mid LAD, possible 50% stenosis but difficult to quantify due to artifact.   - LHC (8/18): 90% proximal LAD stenosis => DES placed.  - Cardiolite (9/18): EF 75%, low risk study with soft tissue attenuation, no ischemia.  - Echo (6/19): EF 65-70%, mild LVH, mild-moderate AI, mild-moderate MR, severe LAE.  - Cardiolite (9/19): EF 70%, apical thinning, no ischemia.  8. Aortic valve disorder:  Echo (2/12) with EF 55-60%, mild AI and MR, PA systolic pressure 36 mmHg.  Echo (12/14) with EF 65-70%, mild LVH, mild AI, very mild AS, PA systolic pressure 44 mmHg.  - Echo (8/18) with EF 65-70%, mild AS, moderate aortic insufficiency.  - Echo (6/19) with mild-moderate AI.  9. Lung nodules:  CT chest 12/14 stable, no followup recommended.  10. Sinus node dysfunction with junctional escape noted in 6/19: She had St Jude PPM placed.   11. Syncope: ?orthostatic/dehydration, 8/18.  12. OSA: Mild to moderate on 10/18 sleep study.  13. Dementia: Mild, on Namenda.   SH: Married to [name], quit smoking in 1972, 2 daughters.  FH: Father with CAD. Brother with PPM.   ROS: All systems reviewed and negative except as per HPI.   Current Outpatient Medications  Medication Sig Dispense Refill  .  acetaminophen (TYLENOL) 500 MG tablet Take 1,000 mg by mouth every 6 (six) hours as needed for mild pain or headache.     Marland Kitchen aspirin EC 81 MG tablet Take 1 tablet (81 mg total) by mouth daily. 90 tablet 3  . Calcium Carbonate-Vitamin D (CALCIUM 600/VITAMIN D PO) Take 1 tablet by mouth 2 (two) times daily.     . cetirizine (ZYRTEC) 10 MG tablet Take 10 mg by mouth daily as needed for allergies.     . Cholecalciferol (VITAMIN D-3) 5000 units TABS Take 5,000 Units by mouth daily with breakfast.     . cloNIDine (CATAPRES) 0.1 MG tablet Take 1 tablet (0.1 mg total) by mouth daily as needed (Take before ECT treatments.). 10 tablet 1  . Lavender Oil 80 MG CAPS Take 160 mg by mouth at bedtime.    Marland Kitchen levothyroxine (SYNTHROID, LEVOTHROID) 75 MCG tablet TAKE 1 TABLET(75 MCG) BY MOUTH DAILY 90 tablet 3  . Multiple Vitamins-Minerals (MULTIVITAMIN WITH MINERALS) tablet Take 1 tablet by mouth daily.      . nitroGLYCERIN (NITROSTAT) 0.4 MG SL tablet Place 1 tablet (0.4 mg total) under the tongue every 5 (five) minutes as needed for chest pain. 25 tablet 3  . Omega-3 Fatty Acids (OMEGA 3 PO) Take 1 capsule by mouth at bedtime.    Marland Kitchen omeprazole (PRILOSEC) 40 MG capsule Take 1 capsule (40 mg total) by mouth daily before supper.    Marland Kitchen OVER THE COUNTER MEDICATION Take 1 tablet by mouth 2 (two) times daily. *Cocavia*    . primidone (MYSOLINE) 50 MG tablet Take 100 mg by mouth in the morning and take 50 mg by mouth at bedtime 270 tablet 3  . Pyridoxine HCl (VITAMIN B-6) 500 MG tablet Take 500 mg by mouth 2 (two) times daily.    . ranitidine (ZANTAC) 150 MG tablet TAKE 1 TABLET(150 MG) BY MOUTH AT BEDTIME (Patient taking differently: daily as needed. ) 30 tablet 0  . sennosides-docusate sodium (SENOKOT-S) 8.6-50 MG tablet Take 1 tablet by mouth at bedtime.    . TOVIAZ 8 MG TB24 tablet TAKE 1 TABLET BY MOUTH DAILY 90 tablet 3  . vitamin B-12 (CYANOCOBALAMIN) 1000 MCG tablet Take 1,000 mcg by mouth at bedtime.    Marland Kitchen  L-Methylfolate-B12-B6-B2 (CEREFOLIN PO) Take by mouth.    . lactulose (CHRONULAC) 10 GM/15ML solution TAKE 15 ML BY MOUTH DAILY AS NEEDED FOR MILD CONSTIPATION 1350 mL 1  . lithium carbonate 300 MG capsule Take 1 capsule (300 mg total) by mouth at bedtime. 90 capsule 3  . LORazepam (ATIVAN) 0.5 MG tablet Take 0.5 tablets (0.25 mg total) by mouth every 8 (eight) hours as needed for anxiety. 90 tablet 1  . memantine (NAMENDA) 10 MG tablet Take 1 tablet (10 mg total) by mouth 2 (two) times daily. 180 tablet 3  . mirtazapine (REMERON) 7.5 MG tablet Take 1 tablet (7.5 mg total) by mouth at bedtime. 90 tablet 3  . nortriptyline (  PAMELOR) 25 MG capsule Take 2 capsules (50 mg total) by mouth at bedtime. 180 capsule 3  . PARoxetine (PAXIL) 20 MG tablet Take 1 tablet (20 mg total) by mouth at bedtime. 90 tablet 3  . rosuvastatin (CRESTOR) 40 MG tablet Take 1 tablet (40 mg total) by mouth daily. 90 tablet 0   No current facility-administered medications for this encounter.     Pulse 63   Wt 47.4 kg (104 lb 9.6 oz)   LMP  (LMP Unknown)   SpO2 96%   BMI 21.86 kg/m  General: NAD Neck: No JVD, no thyromegaly or thyroid nodule.  Lungs: Clear to auscultation bilaterally with normal respiratory effort. CV: Nondisplaced PMI.  Heart regular S1/S2, no S3/S4, no murmur.  No peripheral edema.  No carotid bruit.  Normal pedal pulses.  Abdomen: Soft, nontender, no hepatosplenomegaly, no distention.  Skin: Intact without lesions or rashes.  Neurologic: Alert and oriented x 3.  Psych: Normal affect. Extremities: No clubbing or cyanosis.  HEENT: Normal.   Assessment/Plan: 1. Symptomatic bradycardia:  Now s/p St Jude PPM.  Symptomatically much improved since then.  2. Carotid stenosis: Mild disease only on last carotid dopplers.   3. CAD:  She had a concerning coronary CTA with extensive calcified plaque in the proximal to mid LAD. Due to artifact, hard to tell degree of stenosis and the study was not good  enough to get FFR from it.  I was concerned that her symptoms could represent angina given the amount of coronary plaque she has => coronary angiography was done, showing tight proximal LAD stenosis (8/18).  This was treated with DES.  PCI did not immediately help her symptoms (neck and chest burning).  This makes me suspect that her symptoms were GERD-related.  She had a Cardiolite post-PCI in 9/18 that showed no evidence for ischemia. Cardiolite in 9/19 also showed no ischemia.  No recent chest pain.  - Continue ASA 81 and statin.  4. Aortic valve disorder: Mild to moderate AI on last echo in 6/19.  5. HTN: Labile.  She is not on antihypertensives currently and BP is not elevated.   6. Hyperlipidemia: Continue Crestor, check lipids next appt. 7. GERD: Chest/neck symptoms did not change with PCI.  I suspect GERD may have been causing her symptoms.   8. Depression: Profound. She is getting ECT though not as often as in the past.    Followup in 6 months.   Loralie Champagne 08/22/2018

## 2018-08-23 DIAGNOSIS — I130 Hypertensive heart and chronic kidney disease with heart failure and stage 1 through stage 4 chronic kidney disease, or unspecified chronic kidney disease: Secondary | ICD-10-CM | POA: Diagnosis not present

## 2018-08-23 DIAGNOSIS — N182 Chronic kidney disease, stage 2 (mild): Secondary | ICD-10-CM | POA: Diagnosis not present

## 2018-08-23 DIAGNOSIS — G4730 Sleep apnea, unspecified: Secondary | ICD-10-CM | POA: Diagnosis not present

## 2018-08-23 DIAGNOSIS — F320 Major depressive disorder, single episode, mild: Secondary | ICD-10-CM | POA: Diagnosis not present

## 2018-08-23 DIAGNOSIS — Z950 Presence of cardiac pacemaker: Secondary | ICD-10-CM | POA: Diagnosis not present

## 2018-08-23 DIAGNOSIS — D631 Anemia in chronic kidney disease: Secondary | ICD-10-CM | POA: Diagnosis not present

## 2018-08-23 DIAGNOSIS — Z87891 Personal history of nicotine dependence: Secondary | ICD-10-CM | POA: Diagnosis not present

## 2018-08-23 DIAGNOSIS — I5032 Chronic diastolic (congestive) heart failure: Secondary | ICD-10-CM | POA: Diagnosis not present

## 2018-08-25 ENCOUNTER — Encounter: Payer: Self-pay | Admitting: Internal Medicine

## 2018-08-25 ENCOUNTER — Ambulatory Visit (INDEPENDENT_AMBULATORY_CARE_PROVIDER_SITE_OTHER): Payer: Medicare Other | Admitting: Internal Medicine

## 2018-08-25 VITALS — BP 124/58 | HR 63 | Ht <= 58 in | Wt 103.0 lb

## 2018-08-25 DIAGNOSIS — I200 Unstable angina: Secondary | ICD-10-CM

## 2018-08-25 DIAGNOSIS — I495 Sick sinus syndrome: Secondary | ICD-10-CM | POA: Diagnosis not present

## 2018-08-25 DIAGNOSIS — Z950 Presence of cardiac pacemaker: Secondary | ICD-10-CM

## 2018-08-25 NOTE — Patient Instructions (Signed)
Medication Instructions:  Your physician recommends that you continue on your current medications as directed. Please refer to the Current Medication list given to you today.  Labwork: None ordered.  Testing/Procedures: None ordered.  Follow-Up: Your physician wants you to follow-up in: 9 months with Dr. Lovena Le.   You will receive a reminder letter in the mail two months in advance. If you don't receive a letter, please call our office to schedule the follow-up appointment.  Remote monitoring is used to monitor your Pacemaker from home. This monitoring reduces the number of office visits required to check your device to one time per year. It allows [name] to keep an eye on the functioning of your device to ensure it is working properly. You are scheduled for a device check from home on 11/27/2017. You may send your transmission at any time that day. If you have a wireless device, the transmission will be sent automatically. After your physician reviews your transmission, you will receive a postcard with your next transmission date.  Any Other Special Instructions Will Be Listed Below (If Applicable).  If you need a refill on your cardiac medications before your next appointment, please call your pharmacy.

## 2018-08-25 NOTE — Progress Notes (Signed)
HPI Mrs. Radman returns today for followup. She is a pleasant 77 yo woman with a h/o sinus node dysfunction who developed prolonged pauses and underwent PPM insertion about 3 months ago. In the interim, she is improved. Her energy level is good. She denies syncope.  Allergies  Allergen Reactions  . Propranolol Other (See Comments)    Low blood pressure  . Brexpiprazole Other (See Comments)    Aphasia and catatonia     Current Outpatient Medications  Medication Sig Dispense Refill  . acetaminophen (TYLENOL) 500 MG tablet Take 1,000 mg by mouth every 6 (six) hours as needed for mild pain or headache.     Marland Kitchen aspirin EC 81 MG tablet Take 1 tablet (81 mg total) by mouth daily. 90 tablet 3  . Calcium Carbonate-Vitamin D (CALCIUM 600/VITAMIN D PO) Take 1 tablet by mouth 2 (two) times daily.     . cetirizine (ZYRTEC) 10 MG tablet Take 10 mg by mouth daily as needed for allergies.     . Cholecalciferol (VITAMIN D-3) 5000 units TABS Take 5,000 Units by mouth daily with breakfast.     . cloNIDine (CATAPRES) 0.1 MG tablet Take 1 tablet (0.1 mg total) by mouth daily as needed (Take before ECT treatments.). 10 tablet 1  . L-Methylfolate-B12-B6-B2 (CEREFOLIN PO) Take by mouth.    . lactulose (CHRONULAC) 10 GM/15ML solution TAKE 15 ML BY MOUTH DAILY AS NEEDED FOR MILD CONSTIPATION 1350 mL 1  . Lavender Oil 80 MG CAPS Take 160 mg by mouth at bedtime.    Marland Kitchen levothyroxine (SYNTHROID, LEVOTHROID) 75 MCG tablet TAKE 1 TABLET(75 MCG) BY MOUTH DAILY 90 tablet 3  . lithium carbonate 300 MG capsule Take 1 capsule (300 mg total) by mouth at bedtime. 90 capsule 3  . LORazepam (ATIVAN) 0.5 MG tablet Take 0.5 tablets (0.25 mg total) by mouth every 8 (eight) hours as needed for anxiety. 90 tablet 1  . memantine (NAMENDA) 10 MG tablet Take 1 tablet (10 mg total) by mouth 2 (two) times daily. 180 tablet 3  . mirtazapine (REMERON) 7.5 MG tablet Take 1 tablet (7.5 mg total) by mouth at bedtime. 90 tablet 3  .  Multiple Vitamins-Minerals (MULTIVITAMIN WITH MINERALS) tablet Take 1 tablet by mouth daily.      . nitroGLYCERIN (NITROSTAT) 0.4 MG SL tablet Place 1 tablet (0.4 mg total) under the tongue every 5 (five) minutes as needed for chest pain. 25 tablet 3  . nortriptyline (PAMELOR) 25 MG capsule Take 2 capsules (50 mg total) by mouth at bedtime. 180 capsule 3  . Omega-3 Fatty Acids (OMEGA 3 PO) Take 1 capsule by mouth at bedtime.    Marland Kitchen omeprazole (PRILOSEC) 40 MG capsule Take 1 capsule (40 mg total) by mouth daily before supper.    Marland Kitchen OVER THE COUNTER MEDICATION Take 1 tablet by mouth 2 (two) times daily. *[place]*    . PARoxetine (PAXIL) 20 MG tablet Take 1 tablet (20 mg total) by mouth at bedtime. 90 tablet 3  . primidone (MYSOLINE) 50 MG tablet Take 100 mg by mouth in the morning and take 50 mg by mouth at bedtime 270 tablet 3  . Pyridoxine HCl (VITAMIN B-6) 500 MG tablet Take 500 mg by mouth 2 (two) times daily.    . ranitidine (ZANTAC) 150 MG tablet TAKE 1 TABLET(150 MG) BY MOUTH AT BEDTIME 30 tablet 0  . rosuvastatin (CRESTOR) 40 MG tablet Take 1 tablet (40 mg total) by mouth daily. 90 tablet 0  .  sennosides-docusate sodium (SENOKOT-S) 8.6-50 MG tablet Take 1 tablet by mouth at bedtime.    . TOVIAZ 8 MG TB24 tablet TAKE 1 TABLET BY MOUTH DAILY 90 tablet 3  . vitamin B-12 (CYANOCOBALAMIN) 1000 MCG tablet Take 1,000 mcg by mouth at bedtime.     No current facility-administered medications for this visit.      Past Medical History:  Diagnosis Date  . Anxiety   . Arthritis    "hips, spine" (03/17/2018)  . Bipolar II disorder ([place])   . Chronic bronchitis ([place])   . Chronic lower back pain   . Chronic right hip pain   . CKD (chronic kidney disease), stage II   . Esophagitis, erosive   . GAD (generalized anxiety disorder)   . GERD (gastroesophageal reflux disease)   . Headache    "maybe monthly" (03/17/2018))  . Heart murmur, systolic   . History of adenomatous polyp of colon    08-04-2016   tubular adenoma  . History of blood transfusion 12/2017   "related to vascular hematoma"  . History of electroconvulsive therapy    at [place]--  started 04-15-2015 to 11-17-2016  total greater than 40 times  . History of hiatal hernia   . Hyperlipidemia   . Hypertension   . Hypothyroidism   . Internal carotid artery stenosis, bilateral    per last duplex 05-01-2014  bilateral ICA 40-59%  . Major depression, chronic    ECT treatments extensive and multiple started 07/ 2016  . Memory loss    "both short and long-term; needs frequent reminders to follow instrucitons" (05/16/2017)  . Migraines    "none in years" (03/17/2018)  . OSA (obstructive sleep apnea)    per study 06/ 2012 moderate OSA  ; "refuses to wear masks" (03/17/2018)  . Osteoporosis   . Pulmonary nodule    monitored by pcp  . S/P placement of cardiac pacemaker 03/17/18 [facility] [employer]  03/18/2018    ROS:   All systems reviewed and negative except as noted in the HPI.   Past Surgical History:  Procedure Laterality Date  . APPENDECTOMY  1971  . AUGMENTATION MAMMAPLASTY Bilateral   . CARDIOVASCULAR STRESS TEST  01-30-2015  dr Aundra Dubin   Low risk nuclear study w/ no evidence ischemia or infarction/  normal LV funciton and wall motion , 76%  . COLONOSCOPY W/ BIOPSIES AND POLYPECTOMY  "multiple"  . COLONOSCOPY WITH ESOPHAGOGASTRODUODENOSCOPY (EGD)  last one 08-04-2016  . CORONARY ANGIOPLASTY WITH STENT PLACEMENT  05/16/2017   "LAD"  . CORONARY STENT INTERVENTION N/A 05/16/2017   Procedure: CORONARY STENT INTERVENTION;  Surgeon: Burnell Blanks, MD;  Location: [facility];  Service: Cardiovascular;  Laterality: N/A;  . ESOPHAGOGASTRODUODENOSCOPY  02-26-04  . ESOPHAGOGASTRODUODENOSCOPY  "multiple"  . INSERT / REPLACE / REMOVE PACEMAKER  03/17/2018  . LEFT HEART CATH AND CORONARY ANGIOGRAPHY N/A 05/16/2017   Procedure: LEFT HEART CATH AND CORONARY ANGIOGRAPHY;  Surgeon: Larey Dresser, MD;  Location: [facility];   Service: Cardiovascular;  Laterality: N/A;  . OVARIAN CYST SURGERY  1970s   Laparotomy   . PACEMAKER IMPLANT N/A 03/17/2018   Procedure: PACEMAKER IMPLANT;  Surgeon: Evans Lance, MD;  Location: [facility];  Service: Cardiovascular;  Laterality: N/A;  . PORT-A-CATH PLACEMENT  05/31/2016; 2018   "@ [facility]; for ECT series"; "@ [facility] also"  . PORT-A-CATH REMOVAL N/A 03/11/2017   Procedure: REMOVAL PORT-A-CATH;  Surgeon: Jackolyn Confer, MD;  Location: [facility];  Service:  General;  Laterality: N/A;  . PORTA CATH REMOVAL  03/17/2018  . PORTA CATH REMOVAL  03/17/2018   Procedure: PORTA CATH REMOVAL;  Surgeon: Evans Lance, MD;  Location: [facility];  Service: Cardiovascular;;  . TRANSTHORACIC ECHOCARDIOGRAM  09/05/2013  dr Aundra Dubin   mild LVH, ef 28-31%, grade 1 diastolic dysfunction/  very mild AV stenosis with mild AR/  trivial MR and PT/ mild to moderate LAE/ mild TR/ mild pulmonary hypertension with PA peak pressure 23mmHg  . TUBAL LIGATION    . VAGINAL HYSTERECTOMY     "partial"     Family History  Problem Relation Age of Onset  . Heart attack Father 74       deceased  . Hypertension Father   . Heart disease Father   . Breast cancer Paternal Grandmother        Age unknown  . Breast cancer Paternal Aunt        Age 108's  . Colon cancer Neg Hx      Social History   Socioeconomic History  . Marital status: Married    Spouse name: Dr. Lyla Son  . Number of children: 2  . Years of education: Not on file  . Highest education level: Not on file  Occupational History  . Occupation: housewife    Employer: UNEMPLOYED  Social Needs  . Financial resource strain: Not on file  . Food insecurity:    Worry: Not on file    Inability: Not on file  . Transportation needs:    Medical: Not on file    Non-medical: Not on file  Tobacco Use  . Smoking status: Former Smoker    Packs/day: 2.00    Years: 15.00    Pack years: 30.00    Types: Cigarettes      Last attempt to quit: 01/13/1971    Years since quitting: 47.6  . Smokeless tobacco: Never Used  Substance and Sexual Activity  . Alcohol use: Yes    Alcohol/week: 0.0 standard drinks    Comment: 03/17/2018 "nothing in years"  . Drug use: Never  . Sexual activity: Not Currently    Comment: intercourse age 9, sexual partners less than 5  Lifestyle  . Physical activity:    Days per week: Not on file    Minutes per session: Not on file  . Stress: Not on file  Relationships  . Social connections:    Talks on phone: Not on file    Gets together: Not on file    Attends religious service: Not on file    Active member of club or organization: Not on file    Attends meetings of clubs or organizations: Not on file    Relationship status: Not on file  . Intimate partner violence:    Fear of current or ex partner: Not on file    Emotionally abused: Not on file    Physically abused: Not on file    Forced sexual activity: Not on file  Other Topics Concern  . Not on file  Social History Narrative  . Not on file     BP (!) 124/58   Pulse 63   Ht 4\' 10"  (1.473 m)   Wt 103 lb (46.7 kg)   LMP  (LMP Unknown)   BMI 21.53 kg/m   Physical Exam:  Well appearing 77 yo woman, NAD HEENT: Unremarkable Neck:  6 cm JVD, no thyromegally Lymphatics:  No adenopathy Back:  No CVA tenderness Lungs:  Clear  with no wheezes HEART:  Regular rate rhythm, no murmurs, no rubs, no clicks Abd:  soft, positive bowel sounds, no organomegally, no rebound, no guarding Ext:  2 plus pulses, no edema, no cyanosis, no clubbing Skin:  No rashes no nodules Neuro:  CN II through XII intact, motor grossly intact  EKG - nsr with atrial pacing  DEVICE  Normal device function.  See PaceArt for details.   Assess/Plan: 1. Sinus node dysfunction - she is s/p PPM insertion.  2. PPM - her [employer] DDD PM is working normally. No change. 3. CAD - she is s/p cath/PCI and has no anginal symptoms.  4. Diastolic  heart failure - her symptoms are class 2. She will continue her current meds. Her EF is 75%.

## 2018-09-17 ENCOUNTER — Other Ambulatory Visit: Payer: Self-pay | Admitting: Gastroenterology

## 2018-09-18 ENCOUNTER — Other Ambulatory Visit: Payer: Self-pay | Admitting: Gastroenterology

## 2018-09-18 ENCOUNTER — Other Ambulatory Visit: Payer: Self-pay | Admitting: *Deleted

## 2018-09-18 ENCOUNTER — Other Ambulatory Visit: Payer: Self-pay | Admitting: Neurology

## 2018-09-18 MED ORDER — FAMOTIDINE 20 MG PO TABS: 20.0000 mg | ORAL_TABLET | Freq: Every day | ORAL | 3 refills | Status: DC

## 2018-09-18 NOTE — Progress Notes (Signed)
pepcid

## 2018-09-22 ENCOUNTER — Other Ambulatory Visit: Payer: Self-pay | Admitting: Internal Medicine

## 2018-10-11 DIAGNOSIS — I2510 Atherosclerotic heart disease of native coronary artery without angina pectoris: Secondary | ICD-10-CM | POA: Diagnosis not present

## 2018-10-11 DIAGNOSIS — Z01818 Encounter for other preprocedural examination: Secondary | ICD-10-CM | POA: Diagnosis not present

## 2018-10-11 DIAGNOSIS — F320 Major depressive disorder, single episode, mild: Secondary | ICD-10-CM | POA: Diagnosis not present

## 2018-10-11 DIAGNOSIS — F329 Major depressive disorder, single episode, unspecified: Secondary | ICD-10-CM | POA: Diagnosis not present

## 2018-10-11 DIAGNOSIS — N182 Chronic kidney disease, stage 2 (mild): Secondary | ICD-10-CM | POA: Diagnosis not present

## 2018-10-11 DIAGNOSIS — I129 Hypertensive chronic kidney disease with stage 1 through stage 4 chronic kidney disease, or unspecified chronic kidney disease: Secondary | ICD-10-CM | POA: Diagnosis not present

## 2018-10-11 DIAGNOSIS — R9431 Abnormal electrocardiogram [ECG] [EKG]: Secondary | ICD-10-CM | POA: Diagnosis not present

## 2018-10-12 ENCOUNTER — Encounter: Payer: Self-pay | Admitting: Internal Medicine

## 2018-10-12 DIAGNOSIS — Z803 Family history of malignant neoplasm of breast: Secondary | ICD-10-CM | POA: Diagnosis not present

## 2018-10-12 DIAGNOSIS — Z1231 Encounter for screening mammogram for malignant neoplasm of breast: Secondary | ICD-10-CM | POA: Diagnosis not present

## 2018-11-18 ENCOUNTER — Other Ambulatory Visit (HOSPITAL_COMMUNITY): Payer: Self-pay | Admitting: Cardiology

## 2018-11-21 LAB — CUP PACEART INCLINIC DEVICE CHECK
Brady Statistic RV Percent Paced: 0.05 %
Date Time Interrogation Session: 20191122203601
Implantable Lead Implant Date: 20190614
Implantable Lead Location: 753860
Implantable Pulse Generator Implant Date: 20190614
Lead Channel Impedance Value: 562.5 Ohm
Lead Channel Pacing Threshold Amplitude: 0.5 V
Lead Channel Pacing Threshold Amplitude: 0.875 V
Lead Channel Pacing Threshold Pulse Width: 0.5 ms
Lead Channel Sensing Intrinsic Amplitude: 12 mV
Lead Channel Setting Sensing Sensitivity: 2 mV
MDC IDC LEAD IMPLANT DT: 20190614
MDC IDC LEAD LOCATION: 753859
MDC IDC MSMT BATTERY REMAINING LONGEVITY: 118 mo
MDC IDC MSMT BATTERY VOLTAGE: 3.01 V
MDC IDC MSMT LEADCHNL RA IMPEDANCE VALUE: 487.5 Ohm
MDC IDC MSMT LEADCHNL RA SENSING INTR AMPL: 1.5 mV
MDC IDC MSMT LEADCHNL RV PACING THRESHOLD PULSEWIDTH: 0.5 ms
MDC IDC SET LEADCHNL RA PACING AMPLITUDE: 2 V
MDC IDC SET LEADCHNL RV PACING AMPLITUDE: 1.125
MDC IDC SET LEADCHNL RV PACING PULSEWIDTH: 0.5 ms
MDC IDC STAT BRADY RA PERCENT PACED: 89 %
Pulse Gen Model: 2272
Pulse Gen Serial Number: 9031934

## 2018-11-27 ENCOUNTER — Encounter: Payer: Medicare Other | Admitting: *Deleted

## 2018-11-29 ENCOUNTER — Telehealth: Payer: Self-pay

## 2018-11-29 DIAGNOSIS — F3170 Bipolar disorder, currently in remission, most recent episode unspecified: Secondary | ICD-10-CM | POA: Diagnosis not present

## 2018-11-29 DIAGNOSIS — N182 Chronic kidney disease, stage 2 (mild): Secondary | ICD-10-CM | POA: Diagnosis not present

## 2018-11-29 DIAGNOSIS — I2510 Atherosclerotic heart disease of native coronary artery without angina pectoris: Secondary | ICD-10-CM | POA: Diagnosis not present

## 2018-11-29 NOTE — Telephone Encounter (Signed)
Left message for patient to remind of missed remote transmission.  

## 2018-11-30 ENCOUNTER — Ambulatory Visit (INDEPENDENT_AMBULATORY_CARE_PROVIDER_SITE_OTHER): Payer: Medicare Other | Admitting: Psychiatry

## 2018-11-30 ENCOUNTER — Encounter: Payer: Self-pay | Admitting: Psychiatry

## 2018-11-30 DIAGNOSIS — G251 Drug-induced tremor: Secondary | ICD-10-CM | POA: Diagnosis not present

## 2018-11-30 DIAGNOSIS — F3181 Bipolar II disorder: Secondary | ICD-10-CM | POA: Diagnosis not present

## 2018-11-30 DIAGNOSIS — F339 Major depressive disorder, recurrent, unspecified: Secondary | ICD-10-CM | POA: Diagnosis not present

## 2018-11-30 MED ORDER — NORTRIPTYLINE HCL 25 MG PO CAPS: 75.0000 mg | ORAL_CAPSULE | Freq: Every day | ORAL | 3 refills | Status: DC

## 2018-11-30 MED ORDER — MEMANTINE HCL 10 MG PO TABS: 10.0000 mg | ORAL_TABLET | Freq: Two times a day (BID) | ORAL | 3 refills | Status: DC

## 2018-11-30 NOTE — Progress Notes (Addendum)
Meghan Oliver [id number] 06/29/1941 78 y.o.  Subjective:   Patient ID:  Meghan Oliver is a 78 y.o. (DOB May 31, 1941) female. Patient was last seen August 21, 2018 Chief Complaint:  Chief Complaint  Patient presents with  . Follow-up    TRD med management  . Memory Loss   Meghan Oliver presents to the office today for follow-up of Severe TR bipolar depression with psychotic features including catatonia.  At the last visit there were no medication changes.  Had ECT yesterday at [name].  Last was Jan 8 and was doing well then.  Next scheduled April 15.  Had a lithium level from there which is pending.  Pretty good until the last 10 days to 2 weeks with less energy and motivation.  Bothered by old sick dog.  Worried about how that will affect her.  Has slept with the dog.  Enjoyed [place].  Going to gym and playing Garden.  Took the class and didn't feel she caught on quickly. Wonders if it is bc of the ECT memory effects.  Bridge is harder and not playing.  Planning to return to [facility] mid April.    I spent the winter in [place] per usual..  No significant depression since here. ECT frequency about 6 weeks.  Gets very anxious the day of the ECT.  ECT consistently for a year.  Best in mood in 7-8 years.  Also the pacemaker helped.  Friends notice the benefit.   family cruise had conflict between the 2 daughters.  46 family and 1 daughter was a problem.  Patient reports stable mood and denies depressed or irritable moods.  Patient denies any recent difficulty with anxiety except as noted.  Patient denies difficulty with sleep initiation or maintenance. Denies appetite disturbance.  Patient reports that energy and motivation have been good, except the last couple of weeks.  Doesn't like new things.  Patient has some difficulty with concentration.  Patient denies any suicidal ideation.  Usually takes Ativan at night and sleeps well 8 hours.    She has a past history of moderate drinking but which caused  depression.  She is not drinking alcohol now.  She is failed multiple drugs in the past including Symbyax, Rexulti, lamotrigine, Abilify, pramipexole, Vraylar Latuda, and many many others.  Saw Dr. Carles Collet last week and no change in neuro meds.  Past Psychiatric Medication Trials: Prozac with lithium and Zyprexa 2.5 to 5 mg, nortriptyline, paroxetine, Vraylar, pramipexole, mirtazapine, Namenda, lorazepam, Rexulti, Latuda 10 mg, this loss list is not exhaustive   Review of Systems:  Review of Systems  Neurological: Negative for tremors and weakness.  Psychiatric/Behavioral: Negative for agitation, behavioral problems, confusion, decreased concentration, dysphoric mood, hallucinations, self-injury, sleep disturbance and suicidal ideas. The patient is not nervous/anxious and is not hyperactive.     Medications: I have reviewed the patient's current medications.  Current Outpatient Medications  Medication Sig Dispense Refill  . acetaminophen (TYLENOL) 500 MG tablet Take 1,000 mg by mouth every 6 (six) hours as needed for mild pain or headache.     Marland Kitchen aspirin EC 81 MG tablet Take 1 tablet (81 mg total) by mouth daily. 90 tablet 3  . Calcium Carbonate-Vitamin D (CALCIUM 600/VITAMIN D PO) Take 1 tablet by mouth 2 (two) times daily.     . Cholecalciferol (VITAMIN D-3) 5000 units TABS Take 5,000 Units by mouth daily with breakfast.     . cloNIDine (CATAPRES) 0.1 MG tablet Take 1 tablet (0.1 mg total)  by mouth daily as needed (Take before ECT treatments.). 10 tablet 1  . L-Methylfolate-B12-B6-B2 (CEREFOLIN PO) Take by mouth.    . lactulose (CHRONULAC) 10 GM/15ML solution TAKE 15 ML BY MOUTH DAILY AS NEEDED FOR MILD CONSTIPATION 1350 mL 1  . Lavender Oil 80 MG CAPS Take 160 mg by mouth at bedtime.    Marland Kitchen levothyroxine (SYNTHROID, LEVOTHROID) 75 MCG tablet TAKE 1 TABLET(75 MCG) BY MOUTH DAILY 90 tablet 3  . lithium carbonate 300 MG capsule Take 1 capsule (300 mg total) by mouth at bedtime. 90 capsule 3  .  LORazepam (ATIVAN) 0.5 MG tablet Take 0.5 tablets (0.25 mg total) by mouth every 8 (eight) hours as needed for anxiety. 90 tablet 1  . memantine (NAMENDA) 10 MG tablet Take 1 tablet (10 mg total) by mouth 2 (two) times daily. 180 tablet 3  . mirtazapine (REMERON) 7.5 MG tablet Take 1 tablet (7.5 mg total) by mouth at bedtime. 90 tablet 3  . Multiple Vitamins-Minerals (MULTIVITAMIN WITH MINERALS) tablet Take 1 tablet by mouth daily.      . nitroGLYCERIN (NITROSTAT) 0.4 MG SL tablet Place 1 tablet (0.4 mg total) under the tongue every 5 (five) minutes as needed for chest pain. 25 tablet 3  . nortriptyline (PAMELOR) 25 MG capsule Take 2 capsules (50 mg total) by mouth at bedtime. 180 capsule 3  . Omega-3 Fatty Acids (OMEGA 3 PO) Take 1 capsule by mouth at bedtime.    Marland Kitchen omeprazole (PRILOSEC) 40 MG capsule Take 1 capsule (40 mg total) by mouth daily before supper.    Marland Kitchen OVER THE COUNTER MEDICATION Take 1 tablet by mouth 2 (two) times daily. *Cocavia*    . PARoxetine (PAXIL) 20 MG tablet Take 1 tablet (20 mg total) by mouth at bedtime. 90 tablet 3  . primidone (MYSOLINE) 50 MG tablet 1 tablet in the morning, 2 tablets in the evening 270 tablet 3  . Pyridoxine HCl (VITAMIN B-6) 500 MG tablet Take 500 mg by mouth 2 (two) times daily.    . rosuvastatin (CRESTOR) 40 MG tablet TAKE 1 TABLET BY MOUTH DAILY 90 tablet 0  . sennosides-docusate sodium (SENOKOT-S) 8.6-50 MG tablet Take 1 tablet by mouth at bedtime.    . TOVIAZ 8 MG TB24 tablet TAKE 1 TABLET BY MOUTH DAILY 90 tablet 3  . vitamin B-12 (CYANOCOBALAMIN) 1000 MCG tablet Take 1,000 mcg by mouth at bedtime.    . cetirizine (ZYRTEC) 10 MG tablet Take 10 mg by mouth daily as needed for allergies.     . famotidine (PEPCID) 20 MG tablet Take 1 tablet (20 mg total) by mouth at bedtime. (Patient not taking: Reported on 11/30/2018) 30 tablet 3  . ranitidine (ZANTAC) 150 MG tablet TAKE 1 TABLET(150 MG) BY MOUTH AT BEDTIME (Patient not taking: Reported on 11/30/2018)  30 tablet 0   No current facility-administered medications for this visit.     Medication Side Effects: Other: tremor stable.  No worse.  Allergies:  Allergies  Allergen Reactions  . Propranolol Other (See Comments)    Low blood pressure  . Brexpiprazole Other (See Comments)    Aphasia and catatonia    Past Medical History:  Diagnosis Date  . Anxiety   . Arthritis    "hips, spine" (03/17/2018)  . Bipolar II disorder ([place])   . Chronic bronchitis ([place])   . Chronic lower back pain   . Chronic right hip pain   . CKD (chronic kidney disease), stage II   . Esophagitis, erosive   .  GAD (generalized anxiety disorder)   . GERD (gastroesophageal reflux disease)   . Headache    "maybe monthly" (03/17/2018))  . Heart murmur, systolic   . History of adenomatous polyp of colon    08-04-2016  tubular adenoma  . History of blood transfusion 12/2017   "related to vascular hematoma"  . History of electroconvulsive therapy    at [place]--  started 04-15-2015 to 11-17-2016  total greater than 40 times  . History of hiatal hernia   . Hyperlipidemia   . Hypertension   . Hypothyroidism   . Internal carotid artery stenosis, bilateral    per last duplex 05-01-2014  bilateral ICA 40-59%  . Major depression, chronic    ECT treatments extensive and multiple started 07/ 2016  . Memory loss    "both short and long-term; needs frequent reminders to follow instrucitons" (05/16/2017)  . Migraines    "none in years" (03/17/2018)  . OSA (obstructive sleep apnea)    per study 06/ 2012 moderate OSA  ; "refuses to wear masks" (03/17/2018)  . Osteoporosis   . Pulmonary nodule    monitored by pcp  . S/P placement of cardiac pacemaker 03/17/18 [facility]  03/18/2018    Family History  Problem Relation Age of Onset  . Heart attack Father 10       deceased  . Hypertension Father   . Heart disease Father   . Breast cancer Paternal Grandmother        Age unknown  . Breast cancer Paternal Aunt        Age  29's  . Colon cancer Neg Hx     Social History   Socioeconomic History  . Marital status: Married    Spouse name: Dr. Lyla Son  . Number of children: 2  . Years of education: Not on file  . Highest education level: Not on file  Occupational History  . Occupation: housewife    Employer: UNEMPLOYED  Social Needs  . Financial resource strain: Not on file  . Food insecurity:    Worry: Not on file    Inability: Not on file  . Transportation needs:    Medical: Not on file    Non-medical: Not on file  Tobacco Use  . Smoking status: Former Smoker    Packs/day: 2.00    Years: 15.00    Pack years: 30.00    Types: Cigarettes    Last attempt to quit: 01/13/1971    Years since quitting: 47.9  . Smokeless tobacco: Never Used  Substance and Sexual Activity  . Alcohol use: Yes    Alcohol/week: 0.0 standard drinks    Comment: 03/17/2018 "nothing in years"  . Drug use: Never  . Sexual activity: Not Currently    Comment: intercourse age 41, sexual partners less than 5  Lifestyle  . Physical activity:    Days per week: Not on file    Minutes per session: Not on file  . Stress: Not on file  Relationships  . Social connections:    Talks on phone: Not on file    Gets together: Not on file    Attends religious service: Not on file    Active member of club or organization: Not on file    Attends meetings of clubs or organizations: Not on file    Relationship status: Not on file  . Intimate partner violence:    Fear of current or ex partner: Not on file    Emotionally abused: Not on file  Physically abused: Not on file    Forced sexual activity: Not on file  Other Topics Concern  . Not on file  Social History Narrative  . Not on file    Past Medical History, Surgical history, Social history, and Family history were reviewed and updated as appropriate.   Please see review of systems for further details on the patient's review from today.   Objective:   Physical Exam:   LMP  (LMP Unknown)   Physical Exam Constitutional:      General: She is not in acute distress.    Appearance: She is well-developed.  Musculoskeletal:        General: No deformity.  Neurological:     Mental Status: She is alert and oriented to person, place, and time.     Motor: No tremor.     Coordination: Coordination abnormal.  Psychiatric:        Attention and Perception: She is attentive.        Mood and Affect: Mood is not anxious or depressed. Affect is not labile, blunt, angry or inappropriate.        Speech: Speech is not slurred.        Behavior: Behavior normal. Behavior is not slowed or withdrawn.        Thought Content: Thought content normal. Thought content does not include homicidal or suicidal ideation. Thought content does not include homicidal or suicidal plan.        Cognition and Memory: She exhibits impaired recent memory.        Judgment: Judgment normal.     Comments: Insight intact. No auditory or visual hallucinations.  Still some STM problems but it's much better than in the past.      Lab Review:     Component Value Date/Time   NA 136 06/12/2018 1518   NA 136 04/20/2017 1151   K 4.1 06/12/2018 1518   CL 101 06/12/2018 1518   CO2 25 06/12/2018 1518   GLUCOSE 84 06/12/2018 1518   BUN 39 (H) 06/12/2018 1518   BUN 24 04/20/2017 1151   CREATININE 0.89 06/12/2018 1518   CREATININE 0.96 (H) 05/02/2018 1607   CALCIUM 9.6 06/12/2018 1518   PROT 7.1 04/11/2018 1159   PROT 6.2 04/20/2017 1151   ALBUMIN 3.9 08/08/2017 1615   ALBUMIN 3.9 04/20/2017 1151   AST 52 (H) 04/11/2018 1159   ALT 75 (H) 04/11/2018 1159   ALKPHOS 46 08/08/2017 1615   BILITOT 0.3 04/11/2018 1159   BILITOT <0.2 04/20/2017 1151   GFRNONAA >60 06/12/2018 1518   GFRNONAA 55 (L) 04/11/2018 1159   GFRAA >60 06/12/2018 1518   GFRAA 64 04/11/2018 1159       Component Value Date/Time   WBC 5.3 06/12/2018 1518   RBC 3.93 06/12/2018 1518   HGB 11.6 (L) 06/12/2018 1518   HGB 10.2  (L) 04/20/2017 1151   HCT 38.2 06/12/2018 1518   HCT 29.6 (L) 04/20/2017 1151   PLT 215 06/12/2018 1518   PLT 385 (H) 04/20/2017 1151   MCV 97.2 06/12/2018 1518   MCV 89 04/20/2017 1151   MCH 29.5 06/12/2018 1518   MCHC 30.4 06/12/2018 1518   RDW 13.0 06/12/2018 1518   RDW 13.9 04/20/2017 1151   LYMPHSABS 1,475 04/11/2018 1159   LYMPHSABS 1.5 04/20/2017 1151   MONOABS 0.5 03/09/2017 1232   EOSABS 89 04/11/2018 1159   EOSABS 0.3 04/20/2017 1151   BASOSABS 18 04/11/2018 1159   BASOSABS 0.0 04/20/2017 1151  Lithium Lvl  Date Value Ref Range Status  07/04/2018 0.5 (L) 0.6 - 1.2 mmol/L Final  Nortriptyline level 51 on 50 mg nightly with paroxetine 20 mg daily  No results found for: PHENYTOIN, PHENOBARB, VALPROATE, CBMZ   .res Assessment: Plan:    Recurrent major depression resistant to treatment ([place])  Bipolar II disorder ([place])  Lithium-induced tremor   History of lithium toxicity repeatedly.  Greater than 50% of face to face time with patient was spent on counseling and coordination of care.  Her severe treatment resistant bipolar depression is currently in remission.  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  During the severe depression she has catatonia.  She has required ECT.  The last episode of depression approximately 10 years stayed in remission on Paxil and nortriptyline and lithium for a period of years until the lithium level was decreased due to tremor.  This time we are maintaining an adequate lithium level.  We did discuss the drug interaction issues between paroxetine and nortriptyline.  Depression and anxiety much improved.   Her nortriptyline blood level was within the low normal range at 51.  We discussed the necessary polypharmacy.  She seems to be tolerating the meds well other than the lithium tremor which is being managed with primidone.  She is continuing under the care of her neurologist Dr. Carles Collet.  Awaiting  the lithium level from [facility].  Disc the risk of the alcohol for her relapse of depression.    Keep up q 6 week ECT as it's helping and this frequency seems adequate.  Still hates ECT but realizes it's helpful.  Option increase in the nortriptyline if needed.  Considered this.  They agree and would like to increase nortriptyline to 75 to try to reduce tendency to depression before the ECT.  Disc SE and call if there are any problems with it.  Check nortriptyline level before next appointment and repeat lithium level.  This is necessary because of repeated episodes of lithium toxicity in the past.  This is much improved after eliminating chlorthalidone  Supportive and problem solving oriented psychotherapy around dealing with conflict between the adult daughters.  This appointment was 40 minutes.  Follow-up 8weeks  Lynder Parents MD, DFAPA Please see After Visit Summary for patient specific instructions.  Future Appointments  Date Time Provider [place]  08/15/2019 11:15 AM Tat, Eustace Quail, DO [facility] None    No orders of the defined types were placed in this encounter.     -------------------------------

## 2018-12-06 ENCOUNTER — Encounter: Payer: Self-pay | Admitting: Cardiology

## 2019-01-16 ENCOUNTER — Encounter: Payer: Self-pay | Admitting: Gastroenterology

## 2019-01-16 ENCOUNTER — Telehealth: Payer: Self-pay | Admitting: Gastroenterology

## 2019-01-16 NOTE — Telephone Encounter (Signed)
Plenvu sample left up front for pickup.

## 2019-01-16 NOTE — Telephone Encounter (Signed)
Dr Inocente Salles called back, she had multiple bowel movements last night after dulcolax suppository. Will hold off bowel purge Continue Miralax 1/2 capful twice daily Increase dietary fiber and water intake.  Follow up telephone call in 1 week

## 2019-01-16 NOTE — Telephone Encounter (Signed)
Dr. Lyla Son called yesterday evening that Meghan Oliver has not had a bowel movement in 3 days, she is uncomfortable with bloating, nausea and worsening reflux symptoms.  She was hospitalized with severe constipation last year She has taken increasing amounts of MiraLAX, 5 capfuls so far throughout the day with no bowel movement. Advised to start Dulcolax or glycerin suppository twice daily. We will plan to do bowel purge, [name] (RN) to pick up bowel prep kit from our office this morning.

## 2019-01-22 ENCOUNTER — Other Ambulatory Visit: Payer: Self-pay

## 2019-01-22 MED ORDER — PAROXETINE HCL 20 MG PO TABS: 20.0000 mg | ORAL_TABLET | Freq: Every day | ORAL | 3 refills | Status: DC

## 2019-01-24 DIAGNOSIS — F3170 Bipolar disorder, currently in remission, most recent episode unspecified: Secondary | ICD-10-CM | POA: Diagnosis not present

## 2019-01-24 DIAGNOSIS — I10 Essential (primary) hypertension: Secondary | ICD-10-CM | POA: Diagnosis not present

## 2019-01-24 DIAGNOSIS — Z1159 Encounter for screening for other viral diseases: Secondary | ICD-10-CM | POA: Diagnosis not present

## 2019-01-24 DIAGNOSIS — Z01812 Encounter for preprocedural laboratory examination: Secondary | ICD-10-CM | POA: Diagnosis not present

## 2019-01-26 DIAGNOSIS — F332 Major depressive disorder, recurrent severe without psychotic features: Secondary | ICD-10-CM | POA: Diagnosis not present

## 2019-01-26 DIAGNOSIS — I130 Hypertensive heart and chronic kidney disease with heart failure and stage 1 through stage 4 chronic kidney disease, or unspecified chronic kidney disease: Secondary | ICD-10-CM | POA: Diagnosis not present

## 2019-01-26 DIAGNOSIS — Z79899 Other long term (current) drug therapy: Secondary | ICD-10-CM | POA: Diagnosis not present

## 2019-01-26 DIAGNOSIS — N182 Chronic kidney disease, stage 2 (mild): Secondary | ICD-10-CM | POA: Diagnosis not present

## 2019-01-26 DIAGNOSIS — I959 Hypotension, unspecified: Secondary | ICD-10-CM | POA: Diagnosis not present

## 2019-01-26 DIAGNOSIS — I5032 Chronic diastolic (congestive) heart failure: Secondary | ICD-10-CM | POA: Diagnosis not present

## 2019-01-26 DIAGNOSIS — I2510 Atherosclerotic heart disease of native coronary artery without angina pectoris: Secondary | ICD-10-CM | POA: Diagnosis not present

## 2019-01-26 DIAGNOSIS — F3170 Bipolar disorder, currently in remission, most recent episode unspecified: Secondary | ICD-10-CM | POA: Diagnosis not present

## 2019-01-26 DIAGNOSIS — F3176 Bipolar disorder, in full remission, most recent episode depressed: Secondary | ICD-10-CM | POA: Diagnosis not present

## 2019-02-01 ENCOUNTER — Telehealth: Payer: Self-pay | Admitting: Internal Medicine

## 2019-02-01 NOTE — Telephone Encounter (Signed)
Spoke with [name] to schedule CPE and Labs. While speaking with her she let me know that [facility] had preformed a COVID-19 test on Meghan Oliver 01/24/19 and it was negative.

## 2019-02-10 ENCOUNTER — Other Ambulatory Visit (HOSPITAL_COMMUNITY): Payer: Self-pay | Admitting: Cardiology

## 2019-02-12 ENCOUNTER — Other Ambulatory Visit: Payer: Self-pay | Admitting: Psychiatry

## 2019-02-20 ENCOUNTER — Telehealth: Payer: Self-pay | Admitting: Psychiatry

## 2019-02-20 NOTE — Telephone Encounter (Signed)
error 

## 2019-02-21 ENCOUNTER — Ambulatory Visit: Payer: Medicare Other | Admitting: Psychiatry

## 2019-03-01 ENCOUNTER — Telehealth: Payer: Self-pay | Admitting: Psychiatry

## 2019-03-01 DIAGNOSIS — T56891A Toxic effect of other metals, accidental (unintentional), initial encounter: Secondary | ICD-10-CM

## 2019-03-01 DIAGNOSIS — F3181 Bipolar II disorder: Secondary | ICD-10-CM

## 2019-03-01 NOTE — Telephone Encounter (Signed)
Nurse [name] left a voicemail stating Meghan Oliver is having increase anxiety, memory loss and a unsteady gait. Can we check her Lithium level? Did you receive the results of April's Lithium draw. Msg was not received that you obtained them. Please call.

## 2019-03-01 NOTE — Telephone Encounter (Signed)
Please call nurse [name].  I did get her labs from April 5 and they were all stable and unremarkable. As we know unfortunately lithium levels can change fairly quickly over a period of days to weeks depending on precipitating circumstances such as med changes, levels of hydration, diet, etc. so definitely we should get a lithium level if she is having more trouble with balance issues.  I will send a lithium level request to both [facility] and [facility] so she can go to either laboratory she wants to.  I generally prefer [facility]

## 2019-03-02 ENCOUNTER — Other Ambulatory Visit: Payer: Self-pay | Admitting: Psychiatry

## 2019-03-02 DIAGNOSIS — F3181 Bipolar II disorder: Secondary | ICD-10-CM | POA: Diagnosis not present

## 2019-03-02 NOTE — Telephone Encounter (Signed)
Spoke with Meghan Oliver and Dr. Velora Heckler is taking her this morning to [facility].

## 2019-03-03 LAB — LITHIUM LEVEL: Lithium Lvl: 0.4 mmol/L — ABNORMAL LOW (ref 0.6–1.2)

## 2019-03-05 NOTE — Telephone Encounter (Signed)
Serum lithium level from [facility] dated 03/02/2019 was 0.4.  This is in line with her previous levels and if anything is on the low normal side.  This should not be causing balance problems.  Hopefully she has not increased the lorazepam dosage as that could cause balance problems.  If there is more details that nurse [name] can provide or more questions that she has please let me know.  We will pass this message along to nurse [name]?  Thanks.

## 2019-03-05 NOTE — Telephone Encounter (Signed)
Called Meghan Oliver with Lithium level and she agreed about the same, said they will just keep an eye on her, nothing else to report. Will continue medications as ordered. Instructed to call if any changes.

## 2019-03-11 ENCOUNTER — Other Ambulatory Visit: Payer: Self-pay | Admitting: Physician Assistant

## 2019-03-12 NOTE — Telephone Encounter (Signed)
Nandigam patient.  Came up under Amy Lyman.

## 2019-03-19 ENCOUNTER — Other Ambulatory Visit: Payer: Self-pay | Admitting: Psychiatry

## 2019-03-19 ENCOUNTER — Other Ambulatory Visit: Payer: Self-pay

## 2019-03-19 MED ORDER — LITHIUM CARBONATE 300 MG PO CAPS: 300.0000 mg | ORAL_CAPSULE | Freq: Every day | ORAL | 3 refills | Status: DC

## 2019-03-19 NOTE — Telephone Encounter (Signed)
Addendum: Here the contents from an email from the nurse for [name]  Hello [name],   I hope you and your family are healthy and well. I took Meghan Oliver to [facility] for ECT and here are labs from 01/26/19. I am going to also forward them to [facility] for follow up with her LFT's. They are still higher than I would like.  Her lithium level was 0.51 on a daily dose of 300mg  qohs x 4 nights- alternating with 450mg  qohs x 3 nights. She is not demonstrating any signs of elevated levels, minimal hand tremors, less anxiety and restlessness since her ECT treatment.  Her Nortryptyline level was 140 on 75mg  qhs.  I would like to continue her current dosages of both the above meds and recheck her lithium level at her next scheduled ECT of 03/23/19.  Please let me know of any changes you would like to make.  Stay healthy and well- I am on day 8 of COVID in quarantine while [name] and Jaydi remain healthy, thankfully!Meghan Oliver did have a COVID nasal swab 4/22 that was NEG prior to treatment at [facility].  Take care, Meghan Quan RN  Lithium level was 0.4 on the 450mg  alt with 300 mg QOD.  No changes were made in meds.

## 2019-04-04 DIAGNOSIS — Z79899 Other long term (current) drug therapy: Secondary | ICD-10-CM | POA: Diagnosis not present

## 2019-04-04 DIAGNOSIS — I130 Hypertensive heart and chronic kidney disease with heart failure and stage 1 through stage 4 chronic kidney disease, or unspecified chronic kidney disease: Secondary | ICD-10-CM | POA: Diagnosis not present

## 2019-04-04 DIAGNOSIS — F3170 Bipolar disorder, currently in remission, most recent episode unspecified: Secondary | ICD-10-CM | POA: Diagnosis not present

## 2019-04-04 DIAGNOSIS — I5032 Chronic diastolic (congestive) heart failure: Secondary | ICD-10-CM | POA: Diagnosis not present

## 2019-04-04 DIAGNOSIS — Z1159 Encounter for screening for other viral diseases: Secondary | ICD-10-CM | POA: Diagnosis not present

## 2019-04-04 DIAGNOSIS — N182 Chronic kidney disease, stage 2 (mild): Secondary | ICD-10-CM | POA: Diagnosis not present

## 2019-04-04 DIAGNOSIS — R9431 Abnormal electrocardiogram [ECG] [EKG]: Secondary | ICD-10-CM | POA: Diagnosis not present

## 2019-04-09 ENCOUNTER — Encounter: Payer: Self-pay | Admitting: Internal Medicine

## 2019-04-09 ENCOUNTER — Telehealth: Payer: Self-pay | Admitting: Internal Medicine

## 2019-04-09 MED ORDER — NYSTATIN-TRIAMCINOLONE 100000-0.1 UNIT/GM-% EX CREA: 1.0000 "application " | TOPICAL_CREAM | Freq: Two times a day (BID) | CUTANEOUS | 0 refills | Status: DC

## 2019-04-09 NOTE — Telephone Encounter (Signed)
Picture Dr. Velora Heckler sent shows inflammation and scaling in corners of mouth. Call in Mycolog cream bid to corners of mouth for angular cheilitis

## 2019-04-09 NOTE — Telephone Encounter (Signed)
Called and spoke with Dr Velora Heckler, he verbalized understanding, will pick up medication

## 2019-04-09 NOTE — Telephone Encounter (Signed)
Meghan Oliver  [name] Borchard [phone]  [name] called to say she has fever blisters on both sides of her mouth, one of them started several weeks ago and the other one came up this morning. She did have some medication she was taking but is out of it now. She would like for you to call her husband to discuss.

## 2019-04-09 NOTE — Telephone Encounter (Signed)
Have called him and he sent a picture of wife's mouth. This is angular cheilitis. Will call in Mycolog cream

## 2019-04-12 NOTE — Telephone Encounter (Signed)
Spoke with Dr Velora Heckler

## 2019-04-13 ENCOUNTER — Telehealth: Payer: Self-pay

## 2019-04-13 NOTE — Telephone Encounter (Signed)
Called Dr Inocente Salles, didn't reach him, left a message. Called & spoke to [employer] banker, corporate), primary contact.  She is having breakthrough heartburn despite taking Nexium daily.  She is using antacids throughout the day. She is having soft semi-formed stool with half a capful of MiraLAX.  Was having diarrhea with 1 full capful of MiraLAX, improved with decreased dose. Please schedule virtual office visit next available. thanks

## 2019-04-13 NOTE — Telephone Encounter (Signed)
Dr. Inocente Salles called back.  Will add Benefiber 1 teaspoon twice daily Continue MiraLAX half capful daily in the morning and stool softener 1 tablet at bedtime daily  Continue Nexium daily  Follow-up telephone visit in 2 weeks.

## 2019-04-13 NOTE — Telephone Encounter (Signed)
Voicemail received. Patient's spouse has asked the caregiver to call for an appointment for the patient. She is continuing to have heartburn. Please advise. Thanks

## 2019-04-16 NOTE — Telephone Encounter (Signed)
Appointment scheduled. Called to care giver [name], RN No answer. Left the information on her voicemail.

## 2019-04-16 NOTE — Telephone Encounter (Signed)
Yes, thanks

## 2019-04-16 NOTE — Telephone Encounter (Signed)
Is it okay to create an 11:30 on 05/03/19?

## 2019-05-03 ENCOUNTER — Ambulatory Visit: Payer: Medicare Other | Admitting: Gastroenterology

## 2019-05-15 ENCOUNTER — Encounter: Payer: Self-pay | Admitting: Internal Medicine

## 2019-05-15 ENCOUNTER — Other Ambulatory Visit: Payer: Self-pay

## 2019-05-15 ENCOUNTER — Ambulatory Visit (INDEPENDENT_AMBULATORY_CARE_PROVIDER_SITE_OTHER): Payer: Medicare Other | Admitting: Internal Medicine

## 2019-05-15 ENCOUNTER — Other Ambulatory Visit: Payer: Self-pay | Admitting: Internal Medicine

## 2019-05-15 VITALS — BP 110/80 | HR 62 | Ht <= 58 in | Wt 106.0 lb

## 2019-05-15 DIAGNOSIS — Z950 Presence of cardiac pacemaker: Secondary | ICD-10-CM | POA: Diagnosis not present

## 2019-05-15 DIAGNOSIS — G4730 Sleep apnea, unspecified: Secondary | ICD-10-CM | POA: Diagnosis not present

## 2019-05-15 DIAGNOSIS — R413 Other amnesia: Secondary | ICD-10-CM | POA: Diagnosis not present

## 2019-05-15 DIAGNOSIS — Z9882 Breast implant status: Secondary | ICD-10-CM | POA: Diagnosis not present

## 2019-05-15 DIAGNOSIS — I739 Peripheral vascular disease, unspecified: Secondary | ICD-10-CM | POA: Diagnosis not present

## 2019-05-15 DIAGNOSIS — I10 Essential (primary) hypertension: Secondary | ICD-10-CM | POA: Diagnosis not present

## 2019-05-15 DIAGNOSIS — K5909 Other constipation: Secondary | ICD-10-CM | POA: Diagnosis not present

## 2019-05-15 DIAGNOSIS — F310 Bipolar disorder, current episode hypomanic: Secondary | ICD-10-CM | POA: Diagnosis not present

## 2019-05-15 DIAGNOSIS — E7800 Pure hypercholesterolemia, unspecified: Secondary | ICD-10-CM | POA: Diagnosis not present

## 2019-05-15 DIAGNOSIS — D649 Anemia, unspecified: Secondary | ICD-10-CM | POA: Diagnosis not present

## 2019-05-15 DIAGNOSIS — R251 Tremor, unspecified: Secondary | ICD-10-CM | POA: Diagnosis not present

## 2019-05-15 DIAGNOSIS — Z0000 Encounter for general adult medical examination without abnormal findings: Secondary | ICD-10-CM | POA: Diagnosis not present

## 2019-05-15 DIAGNOSIS — K219 Gastro-esophageal reflux disease without esophagitis: Secondary | ICD-10-CM | POA: Diagnosis not present

## 2019-05-15 DIAGNOSIS — M810 Age-related osteoporosis without current pathological fracture: Secondary | ICD-10-CM

## 2019-05-15 DIAGNOSIS — I779 Disorder of arteries and arterioles, unspecified: Secondary | ICD-10-CM

## 2019-05-15 LAB — POCT URINALYSIS DIPSTICK
Appearance: NEGATIVE
Bilirubin, UA: NEGATIVE
Blood, UA: NEGATIVE
Glucose, UA: NEGATIVE
Ketones, UA: NEGATIVE
Leukocytes, UA: NEGATIVE
Nitrite, UA: NEGATIVE
Odor: NEGATIVE
Protein, UA: NEGATIVE
Spec Grav, UA: 1.015 (ref 1.010–1.025)
Urobilinogen, UA: 0.2 E.U./dL
pH, UA: 6 (ref 5.0–8.0)

## 2019-05-15 NOTE — Progress Notes (Signed)
Subjective:    Patient ID: Meghan Oliver, female    DOB: 03-12-1941, 78 y.o.   MRN: [id number]  HPI 78 year old Female in today for health maintenance exam, Medicare wellness exam and evaluation of medical issues.  She is accompanied by her caretakers, Meghan Oliver [phone] and Meghan Oliver.  Meghan Oliver tells me that Meghan Oliver  is doing very well.  She continues to get ECT at [facility].  She is no longer driving due to short-term memory issues.  Labs drawn and are pending.  Meghan Oliver.NP is her GYN caregiver.  She has a pacemaker and is followed by Cardiology.  Meghan Oliver her Gastroenterologist.  Meghan Oliver to see Meghan Oliver.  Is also been seen by Meghan Oliver, psychiatrist in [place].  His phone number is [phone].  Orthopedist is Dr. Noemi Chapel.  She has a history of Bipolar 2 illness, hypertension, GE reflux, urinary frequency, hyperlipidemia and hypothyroidism.  History of left lower lobe pneumonia 2015.  Appendectomy 1971.  History of bilateral tubal ligation and left ovarian cystectomy.  History of bilateral breast implants.  Longstanding history of depression starting in her 34s after the birth of her first child.  History of obstructive sleep apnea.  History of carotid disease monitored by cardiology.  History of lumbar disc disease.  Is seeing Dr. Carles Collet for tremor.  Has been treated with primidone.  History of osteoporosis treated with Fosamax.  Social history: Has been married for over 55 years to a retired Copywriter, advertising.  Does not smoke.  Quit smoking in 1972.  2 adult daughters, one lives in [place] and one lives in [place].  Family history: Her only sibling, a brother, diagnosed with dementia.  Father with history of bipolar disorder died at age 34 of an MI.  Mother died with history of COPD.  She looks much more energetic and is more conversant than at last physical exam July 2019.  Review of Systems no new complaints     Objective:   Physical Exam Vital signs reviewed.  Skin warm and dry.  Nodes none.  Neck is supple.  Chest clear.  Cardiac exam regular rate and rhythm.  2/6 systolic ejection murmur transmitted to carotids bilaterally.  She has bilateral breast implants.  Abdomen soft nondistended without hepatosplenomegaly masses or tenderness.  No lower extremity edema.  She has bilateral hand tremor.  Memory deficits noted.  Affect is normal.  She is cooperative.  Labs drawn and pending.       Assessment & Plan:  History of Bipolar 2 disorder treated by psychiatrist and still receiving ECT at [facility] loss being monitored by psychiatrist  Bilateral hand tremor on primidone by Dr. Carles Collet  History of obstructive sleep apnea  GE reflux treated with PPI  Osteoporosis treated with Fosamax  History of bilateral breast implants  Chronic constipation-has been given lactulose  Lumbar disc disease  Hyperlipidemia  Pacemaker  Bilateral carotid disease monitored by cardiology  Hypothyroidism stable on current thyroid replacement medication  Plan: Labs drawn and pending and will be reviewed by myself and Trula Ore.  She generally comes yearly.  Subjective:   Patient presents for Medicare Annual/Subsequent preventive examination.  Review Past Medical/Family/Social: See above   Risk Factors  Current exercise habits: Not a lot of exercise does some yoga Dietary issues discussed: Low-fat low carbohydrate  Cardiac risk factors: Pacemaker, hyperlipidemia, family history  Depression Screen  (Note: if answer to either of the following is "Yes", a more complete depression  screening is indicated)   Over the past two weeks, have you felt down, depressed or hopeless? No  Over the past two weeks, have you felt little interest or pleasure in doing things? No Have you lost interest or pleasure in daily life? No Do you often feel hopeless? No Do you cry easily over simple problems? No   Activities of Daily  Living  In your present state of health, do you have any difficulty performing the following activities?:   Driving? No longer drives due to memory issues Managing money? No  Feeding yourself? No  Getting from bed to chair? No  Climbing a flight of stairs?  Yes go slowly Preparing food and eating?: No  Bathing or showering? No  Getting dressed: No  Getting to the toilet? No  Using the toilet:No  Moving around from place to place: No  In the past year have you fallen or had a near fall?:No  Are you sexually active? No  Do you have more than one partner? No   Hearing Difficulties: No  Do you often ask people to speak up or repeat themselves? No  Do you experience ringing or noises in your ears? No  Do you have difficulty understanding soft or whispered voices? No  Do you feel that you have a problem with memory?  Yes  Do you often misplace items?  Yes   Home Safety:  Do you have a smoke alarm at your residence? Yes Do you have grab bars in the bathroom?  Yes Do you have throw rugs in your house?  No   Cognitive Testing  Alert? Yes Normal Appearance?Yes  Oriented to person? Yes Place? Yes  Time?  No  Recall of three objects?  No Can perform simple calculations?  Not tested Displays appropriate judgment?  Did not inquire Can read the correct time from a watch face?Yes   List the Names of Other Physician/Practitioners you currently use:  See referral list for the physicians patient is currently seeing.     Review of Systems: See above   Objective:     General appearance: Appears younger than stated age Head: Normocephalic, without obvious abnormality, atraumatic  Eyes: conj clear, EOMi PEERLA  Ears: normal TM's and external ear canals both ears  Nose: Nares normal. Septum midline. Mucosa normal. No drainage or sinus tenderness.  Throat: lips, mucosa, and tongue normal; teeth and gums normal  Neck: no adenopathy, no carotid bruit, no JVD, supple, symmetrical,  trachea midline and thyroid not enlarged, symmetric, no tenderness/mass/nodules  No CVA tenderness.  Lungs: clear to auscultation bilaterally  Breasts: Bilateral breast implants Heart: regular rate and rhythm, S1, S2 normal, 2/6 systolic murmur,   Transmitted murmur heard into the carotids. Abdomen: soft, non-tender; bowel sounds normal; no masses, no organomegaly  Musculoskeletal: ROM normal in all joints, no crepitus, no deformity, Normal muscle strengthen. Back  is symmetric, no curvature. Skin: Skin color, texture, turgor normal. No rashes or lesions  Lymph nodes: Cervical, supraclavicular, and axillary nodes normal.  Neurologic: CN 2 -12 Normal, Normal symmetric reflexes. Normal coordination and gait  Psych: Alert , Mood appears stable.  Some memory deficits.   Assessment:    Annual wellness medicare exam   Plan:    During the course of the visit the patient was educated and counseled about appropriate screening and preventive services including:   Mammogram, annual flu vaccine     Patient Instructions (the written plan) was given to the patient.  Medicare Attestation  I have personally reviewed:  The patient's medical and social history  Their use of alcohol, tobacco or illicit drugs  Their current medications and supplements  The patient's functional ability including ADLs,fall risks, home safety risks, cognitive, and hearing and visual impairment  Diet and physical activities  Evidence for depression or mood disorders  The patient's weight, height, BMI, and visual acuity have been recorded in the chart. I have made referrals, counseling, and provided education to the patient based on review of the above and I have provided the patient with a written personalized care plan for preventive services.

## 2019-05-16 LAB — COMPLETE METABOLIC PANEL WITH GFR
AG Ratio: 1.7 (calc) (ref 1.0–2.5)
ALT: 82 U/L — ABNORMAL HIGH (ref 6–29)
AST: 55 U/L — ABNORMAL HIGH (ref 10–35)
Albumin: 4.6 g/dL (ref 3.6–5.1)
Alkaline phosphatase (APISO): 53 U/L (ref 37–153)
BUN/Creatinine Ratio: 25 (calc) — ABNORMAL HIGH (ref 6–22)
BUN: 25 mg/dL (ref 7–25)
CO2: 26 mmol/L (ref 20–32)
Calcium: 10.3 mg/dL (ref 8.6–10.4)
Chloride: 100 mmol/L (ref 98–110)
Creat: 1.01 mg/dL — ABNORMAL HIGH (ref 0.60–0.93)
GFR, Est African American: 61 mL/min/{1.73_m2} (ref >=60)
GFR, Est Non African American: 53 mL/min/{1.73_m2} — ABNORMAL LOW (ref >=60)
Globulin: 2.7 g/dL (calc) (ref 1.9–3.7)
Glucose, Bld: 74 mg/dL (ref 65–99)
Potassium: 4.5 mmol/L (ref 3.5–5.3)
Sodium: 136 mmol/L (ref 135–146)
Total Bilirubin: 0.5 mg/dL (ref 0.2–1.2)
Total Protein: 7.3 g/dL (ref 6.1–8.1)

## 2019-05-16 LAB — LIPID PANEL
Cholesterol: 230 mg/dL — ABNORMAL HIGH (ref <200)
HDL: 139 mg/dL (ref >=50)
LDL Cholesterol (Calc): 77 mg/dL (calc)
Non-HDL Cholesterol (Calc): 91 mg/dL (calc) (ref <130)
Total CHOL/HDL Ratio: 1.7 (calc) (ref <5.0)
Triglycerides: 65 mg/dL (ref <150)

## 2019-05-16 LAB — CBC WITH DIFFERENTIAL/PLATELET
Absolute Monocytes: 517 cells/uL (ref 200–950)
Basophils Absolute: 38 cells/uL (ref 0–200)
Basophils Relative: 0.6 %
Eosinophils Absolute: 82 cells/uL (ref 15–500)
Eosinophils Relative: 1.3 %
HCT: 40.8 % (ref 35.0–45.0)
Hemoglobin: 13.7 g/dL (ref 11.7–15.5)
Lymphs Abs: 1764 cells/uL (ref 850–3900)
MCH: 33 pg (ref 27.0–33.0)
MCHC: 33.6 g/dL (ref 32.0–36.0)
MCV: 98.3 fL (ref 80.0–100.0)
MPV: 10.2 fL (ref 7.5–12.5)
Monocytes Relative: 8.2 %
Neutro Abs: 3900 cells/uL (ref 1500–7800)
Neutrophils Relative %: 61.9 %
Platelets: 243 10*3/uL (ref 140–400)
RBC: 4.15 10*6/uL (ref 3.80–5.10)
RDW: 12 % (ref 11.0–15.0)
Total Lymphocyte: 28 %
WBC: 6.3 10*3/uL (ref 3.8–10.8)

## 2019-05-16 LAB — TSH: TSH: 2.14 mIU/L (ref 0.40–4.50)

## 2019-05-17 ENCOUNTER — Other Ambulatory Visit (HOSPITAL_COMMUNITY): Payer: Self-pay | Admitting: Cardiology

## 2019-05-23 DIAGNOSIS — F3170 Bipolar disorder, currently in remission, most recent episode unspecified: Secondary | ICD-10-CM | POA: Diagnosis not present

## 2019-05-23 DIAGNOSIS — I130 Hypertensive heart and chronic kidney disease with heart failure and stage 1 through stage 4 chronic kidney disease, or unspecified chronic kidney disease: Secondary | ICD-10-CM | POA: Diagnosis not present

## 2019-05-23 DIAGNOSIS — I509 Heart failure, unspecified: Secondary | ICD-10-CM | POA: Diagnosis not present

## 2019-05-23 DIAGNOSIS — N182 Chronic kidney disease, stage 2 (mild): Secondary | ICD-10-CM | POA: Diagnosis not present

## 2019-05-23 DIAGNOSIS — Z1159 Encounter for screening for other viral diseases: Secondary | ICD-10-CM | POA: Diagnosis not present

## 2019-05-30 DIAGNOSIS — L821 Other seborrheic keratosis: Secondary | ICD-10-CM | POA: Diagnosis not present

## 2019-05-30 DIAGNOSIS — L814 Other melanin hyperpigmentation: Secondary | ICD-10-CM | POA: Diagnosis not present

## 2019-05-30 DIAGNOSIS — Z86018 Personal history of other benign neoplasm: Secondary | ICD-10-CM | POA: Diagnosis not present

## 2019-05-30 DIAGNOSIS — D225 Melanocytic nevi of trunk: Secondary | ICD-10-CM | POA: Diagnosis not present

## 2019-05-30 DIAGNOSIS — Z411 Encounter for cosmetic surgery: Secondary | ICD-10-CM | POA: Diagnosis not present

## 2019-06-01 ENCOUNTER — Ambulatory Visit: Payer: Medicare Other | Admitting: Gastroenterology

## 2019-06-13 ENCOUNTER — Ambulatory Visit (INDEPENDENT_AMBULATORY_CARE_PROVIDER_SITE_OTHER): Payer: Medicare Other | Admitting: Internal Medicine

## 2019-06-13 ENCOUNTER — Other Ambulatory Visit: Payer: Self-pay

## 2019-06-13 DIAGNOSIS — Z23 Encounter for immunization: Secondary | ICD-10-CM

## 2019-06-13 NOTE — Patient Instructions (Signed)
Patient received a flu vaccine IM L deltoid, AV, CMA  

## 2019-06-13 NOTE — Progress Notes (Signed)
Flu vaccine given by CMA 

## 2019-06-14 ENCOUNTER — Telehealth: Payer: Self-pay | Admitting: Psychiatry

## 2019-06-14 NOTE — Telephone Encounter (Signed)
[name] card called with Meghan Oliver's lithium level was 0.55. She needs 4 new scripts. Lithium 150 mg quanitity 45 with 3 refills.Nortriptilyne 25 mg capsules 3x d quant 270 3 refills. Preimadine 50mg  2 am 1 qhs quant 270. Lorazapam .05mg  1/2 to 1 tab qhs and 1 8 prn do not fill before 07/24/2019. If you have any questions call kim [phone]

## 2019-06-15 ENCOUNTER — Other Ambulatory Visit: Payer: Self-pay

## 2019-06-15 MED ORDER — LORAZEPAM 0.5 MG PO TABS: ORAL_TABLET | ORAL | 0 refills | Status: DC

## 2019-06-15 MED ORDER — PRIMIDONE 50 MG PO TABS: ORAL_TABLET | ORAL | 3 refills | Status: DC

## 2019-06-15 MED ORDER — NORTRIPTYLINE HCL 25 MG PO CAPS: 75.0000 mg | ORAL_CAPSULE | Freq: Every day | ORAL | 3 refills | Status: DC

## 2019-06-15 MED ORDER — LITHIUM CARBONATE 150 MG PO CAPS: ORAL_CAPSULE | ORAL | 3 refills | Status: DC

## 2019-06-18 NOTE — Telephone Encounter (Signed)
Lithium level noted and is stable.  Refill sent.

## 2019-06-22 ENCOUNTER — Other Ambulatory Visit (HOSPITAL_COMMUNITY): Payer: Self-pay | Admitting: Cardiology

## 2019-06-24 ENCOUNTER — Encounter: Payer: Self-pay | Admitting: Internal Medicine

## 2019-06-24 NOTE — Patient Instructions (Signed)
It was a pleasure to see you today.  Continue current treatments and return in 1 year or as needed.  No change in medications.  Labs drawn and pending.

## 2019-06-27 ENCOUNTER — Other Ambulatory Visit: Payer: Self-pay | Admitting: Emergency Medicine

## 2019-06-27 DIAGNOSIS — R6889 Other general symptoms and signs: Secondary | ICD-10-CM | POA: Diagnosis not present

## 2019-06-27 DIAGNOSIS — Z20822 Contact with and (suspected) exposure to covid-19: Secondary | ICD-10-CM

## 2019-06-29 LAB — NOVEL CORONAVIRUS, NAA: SARS-CoV-2, NAA: NOT DETECTED

## 2019-07-12 ENCOUNTER — Encounter: Payer: Self-pay | Admitting: Internal Medicine

## 2019-07-12 ENCOUNTER — Other Ambulatory Visit: Payer: Self-pay

## 2019-07-12 ENCOUNTER — Ambulatory Visit (INDEPENDENT_AMBULATORY_CARE_PROVIDER_SITE_OTHER): Payer: Medicare Other | Admitting: Internal Medicine

## 2019-07-12 VITALS — BP 106/70 | HR 62 | Ht <= 58 in | Wt 103.0 lb

## 2019-07-12 DIAGNOSIS — Z950 Presence of cardiac pacemaker: Secondary | ICD-10-CM

## 2019-07-12 DIAGNOSIS — I2510 Atherosclerotic heart disease of native coronary artery without angina pectoris: Secondary | ICD-10-CM | POA: Diagnosis not present

## 2019-07-12 DIAGNOSIS — I495 Sick sinus syndrome: Secondary | ICD-10-CM

## 2019-07-12 NOTE — Progress Notes (Signed)
HPI Meghan Oliver returns today for followup. She is a pleasant 78 yo woman with a h/o sinus node dysfunction who developed prolonged pauses and underwent PPM insertion about 14 months ago. In the interim, she is improved. Her energy level is good. She denies syncope.  Allergies  Allergen Reactions  . Propranolol Other (See Comments)    Low blood pressure  . Brexpiprazole Other (See Comments)    Aphasia and catatonia     Current Outpatient Medications  Medication Sig Dispense Refill  . acetaminophen (TYLENOL) 500 MG tablet Take 1,000 mg by mouth every 6 (six) hours as needed for mild pain or headache.     Marland Kitchen aspirin EC 81 MG tablet Take 1 tablet (81 mg total) by mouth daily. 90 tablet 3  . Calcium Carbonate-Vitamin D (CALCIUM 600/VITAMIN D PO) Take 1 tablet by mouth 2 (two) times daily.     . cetirizine (ZYRTEC) 10 MG tablet Take 10 mg by mouth daily as needed for allergies.     . Cholecalciferol (VITAMIN D-3) 5000 units TABS Take 5,000 Units by mouth daily with breakfast.     . cloNIDine (CATAPRES) 0.1 MG tablet TAKE 1 TABLET BY MOUTH DAILY AS NEEDED.( TAKE BEFORE ECT TREATMENTS) 10 tablet 1  . famotidine (PEPCID) 20 MG tablet Take 1 tablet (20 mg total) by mouth at bedtime. 30 tablet 3  . L-Methylfolate-B12-B6-B2 (CEREFOLIN PO) Take by mouth.    . lactulose (CHRONULAC) 10 GM/15ML solution TAKE 15 ML BY MOUTH DAILY AS NEEDED FOR MILD CONSTIPATION 1350 mL 1  . Lavender Oil 80 MG CAPS Take 160 mg by mouth at bedtime.    Marland Kitchen levothyroxine (SYNTHROID) 75 MCG tablet TAKE 1 TABLET(75 MCG) BY MOUTH DAILY 90 tablet 3  . lithium carbonate 150 MG capsule Take as directed on M,W,F 45 capsule 3  . lithium carbonate 300 MG capsule Take 1 capsule (300 mg total) by mouth at bedtime. (Patient taking differently: Take 300 mg by mouth at bedtime. 300mg  Sunday, Tuesday, Thursday and Saturday, 450mg  Monday, Wednesday and Friday.) 90 capsule 3  . [START ON 07/24/2019] LORazepam (ATIVAN) 0.5 MG tablet  Take 1/2-1 tablet by mouth at bedtime, take 1 every 8 hours as needed for anxiety. 180 tablet 0  . memantine (NAMENDA) 10 MG tablet Take 1 tablet (10 mg total) by mouth 2 (two) times daily. 180 tablet 3  . Methylfol-Algae-B12-Acetylcyst (CEREFOLIN NAC) 6-90.314-2-600 MG TABS TAKE 1 CAPLET BY MOUTH THREE TIMES A DAY (Patient taking differently: 2 (two) times daily. ) 270 tablet 0  . mirtazapine (REMERON) 7.5 MG tablet Take 1 tablet (7.5 mg total) by mouth at bedtime. 90 tablet 3  . Multiple Vitamins-Minerals (MULTIVITAMIN WITH MINERALS) tablet Take 1 tablet by mouth daily.      . nortriptyline (PAMELOR) 25 MG capsule Take 3 capsules (75 mg total) by mouth at bedtime. 270 capsule 3  . nystatin-triamcinolone (MYCOLOG II) cream Apply 1 application topically 2 (two) times daily. (Patient taking differently: Apply 1 application topically as needed. ) 30 g 0  . Omega-3 Fatty Acids (OMEGA 3 PO) Take 1 capsule by mouth at bedtime.    Marland Kitchen omeprazole (PRILOSEC) 40 MG capsule TAKE 1 CAPSULE BY MOUTH BEFORE DINNER 30 capsule 6  . OVER THE COUNTER MEDICATION Take 1 tablet by mouth 2 (two) times daily. *[place]ocavia*    . PARoxetine (PAXIL) 20 MG tablet Take 1 tablet (20 mg total) by mouth at bedtime. 90 tablet 3  . polyethylene glycol (MIRALAX /  GLYCOLAX) 17 g packet Take 17 g by mouth as needed.    . primidone (MYSOLINE) 50 MG tablet 2 tablets in the morning, 1 tablet in the evening 270 tablet 3  . Pyridoxine HCl (VITAMIN B-6) 500 MG tablet Take 500 mg by mouth 2 (two) times daily.    . ranitidine (ZANTAC) 150 MG tablet TAKE 1 TABLET(150 MG) BY MOUTH AT BEDTIME 30 tablet 0  . rosuvastatin (CRESTOR) 40 MG tablet Take 1 tablet (40 mg total) by mouth daily. Needs appt 90 tablet 0  . sennosides-docusate sodium (SENOKOT-S) 8.6-50 MG tablet Take 2 tablets by mouth 2 (two) times daily.     . TOVIAZ 8 MG TB24 tablet TAKE 1 TABLET BY MOUTH DAILY 90 tablet 3  . vitamin B-12 (CYANOCOBALAMIN) 1000 MCG tablet Take 1,000 mcg by  mouth at bedtime.    . nitroGLYCERIN (NITROSTAT) 0.4 MG SL tablet Place 1 tablet (0.4 mg total) under the tongue every 5 (five) minutes as needed for chest pain. 25 tablet 3   No current facility-administered medications for this visit.      Past Medical History:  Diagnosis Date  . Anxiety   . Arthritis    "hips, spine" (03/17/2018)  . Bipolar II disorder ([place])   . Chronic bronchitis ([place])   . Chronic lower back pain   . Chronic right hip pain   . CKD (chronic kidney disease), stage II   . Esophagitis, erosive   . GAD (generalized anxiety disorder)   . GERD (gastroesophageal reflux disease)   . Headache    "maybe monthly" (03/17/2018))  . Heart murmur, systolic   . History of adenomatous polyp of colon    08-04-2016  tubular adenoma  . History of blood transfusion 12/2017   "related to vascular hematoma"  . History of electroconvulsive therapy    at [place]--  started 04-15-2015 to 11-17-2016  total greater than 40 times  . History of hiatal hernia   . Hyperlipidemia   . Hypertension   . Hypothyroidism   . Internal carotid artery stenosis, bilateral    per last duplex 05-01-2014  bilateral ICA 40-59%  . Major depression, chronic    ECT treatments extensive and multiple started 07/ 2016  . Memory loss    "both short and long-term; needs frequent reminders to follow instrucitons" (05/16/2017)  . Migraines    "none in years" (03/17/2018)  . OSA (obstructive sleep apnea)    per study 06/ 2012 moderate OSA  ; "refuses to wear masks" (03/17/2018)  . Osteoporosis   . Pulmonary nodule    monitored by pcp  . S/P placement of cardiac pacemaker 03/17/18 ST Jude  03/18/2018    ROS:   All systems reviewed and negative except as noted in the HPI.   Past Surgical History:  Procedure Laterality Date  . APPENDECTOMY  1971  . AUGMENTATION MAMMAPLASTY Bilateral   . CARDIOVASCULAR STRESS TEST  01-30-2015  dr Aundra Dubin   Low risk nuclear study w/ no evidence ischemia or infarction/  normal  LV funciton and wall motion , 76%  . COLONOSCOPY W/ BIOPSIES AND POLYPECTOMY  "multiple"  . COLONOSCOPY WITH ESOPHAGOGASTRODUODENOSCOPY (EGD)  last one 08-04-2016  . CORONARY ANGIOPLASTY WITH STENT PLACEMENT  05/16/2017   "LAD"  . CORONARY STENT INTERVENTION N/A 05/16/2017   Procedure: CORONARY STENT INTERVENTION;  Surgeon: Burnell Blanks, MD;  Location: [facility];  Service: Cardiovascular;  Laterality: N/A;  . ESOPHAGOGASTRODUODENOSCOPY  02-26-04  . ESOPHAGOGASTRODUODENOSCOPY  "multiple"  .  INSERT / REPLACE / REMOVE PACEMAKER  03/17/2018  . LEFT HEART CATH AND CORONARY ANGIOGRAPHY N/A 05/16/2017   Procedure: LEFT HEART CATH AND CORONARY ANGIOGRAPHY;  Surgeon: Larey Dresser, MD;  Location: [facility];  Service: Cardiovascular;  Laterality: N/A;  . OVARIAN CYST SURGERY  1970s   Laparotomy   . PACEMAKER IMPLANT N/A 03/17/2018   Procedure: PACEMAKER IMPLANT;  Surgeon: Evans Lance, MD;  Location: [facility];  Service: Cardiovascular;  Laterality: N/A;  . PORT-A-CATH PLACEMENT  05/31/2016; 2018   "@ [facility]; for ECT series"; "@ [facility] also"  . PORT-A-CATH REMOVAL N/A 03/11/2017   Procedure: REMOVAL PORT-A-CATH;  Surgeon: Jackolyn Confer, MD;  Location: [facility];  Service: General;  Laterality: N/A;  . PORTA CATH REMOVAL  03/17/2018  . PORTA CATH REMOVAL  03/17/2018   Procedure: PORTA CATH REMOVAL;  Surgeon: Evans Lance, MD;  Location: [facility];  Service: Cardiovascular;;  . TRANSTHORACIC ECHOCARDIOGRAM  09/05/2013  dr Aundra Dubin   mild LVH, ef [id number], grade 1 diastolic dysfunction/  very mild AV stenosis with mild AR/  trivial MR and PT/ mild to moderate LAE/ mild TR/ mild pulmonary hypertension with PA peak pressure 61mmHg  . TUBAL LIGATION    . VAGINAL HYSTERECTOMY     "partial"     Family History  Problem Relation Age of Onset  . Heart attack Father 11       deceased  . Hypertension Father   . Heart disease Father   . Breast  cancer Paternal Grandmother        Age unknown  . Breast cancer Paternal Aunt        Age 52's  . Colon cancer Neg Hx      Social History   Socioeconomic History  . Marital status: Married    Spouse name: Dr. Lyla Son  . Number of children: 2  . Years of education: Not on file  . Highest education level: Not on file  Occupational History  . Occupation: housewife    Employer: UNEMPLOYED  Social Needs  . Financial resource strain: Not on file  . Food insecurity    Worry: Not on file    Inability: Not on file  . Transportation needs    Medical: Not on file    Non-medical: Not on file  Tobacco Use  . Smoking status: Former Smoker    Packs/day: 2.00    Years: 15.00    Pack years: 30.00    Types: Cigarettes    Quit date: 01/13/1971    Years since quitting: 48.5  . Smokeless tobacco: Never Used  Substance and Sexual Activity  . Alcohol use: Yes    Alcohol/week: 0.0 standard drinks    Comment: 03/17/2018 "nothing in years"  . Drug use: Never  . Sexual activity: Not Currently    Comment: intercourse age 27, sexual partners less than 5  Lifestyle  . Physical activity    Days per week: Not on file    Minutes per session: Not on file  . Stress: Not on file  Relationships  . Social Herbalist on phone: Not on file    Gets together: Not on file    Attends religious service: Not on file    Active member of club or organization: Not on file    Attends meetings of clubs or organizations: Not on file    Relationship status: Not on file  . Intimate partner violence  Fear of current or ex partner: Not on file    Emotionally abused: Not on file    Physically abused: Not on file    Forced sexual activity: Not on file  Other Topics Concern  . Not on file  Social History Narrative  . Not on file     BP 106/70   Pulse 62   Ht 4\' 10"  (1.473 m)   Wt 103 lb (46.7 kg)   LMP  (LMP Unknown)   SpO2 98%   BMI 21.53 kg/m   Physical Exam:  Well appearing NAD  HEENT: Unremarkable Neck:  No JVD, no thyromegally Lymphatics:  No adenopathy Back:  No CVA tenderness Lungs:  Clear HEART:  Regular rate rhythm, no murmurs, no rubs, no clicks Abd:  soft, positive bowel sounds, no organomegally, no rebound, no guarding Ext:  2 plus pulses, no edema, no cyanosis, no clubbing Skin:  No rashes no nodules Neuro:  CN II through XII intact, motor grossly intact  EKG  DEVICE  Normal device function.  See PaceArt for details.   Assess/Plan: 1. Sinus node dysfunction - she is s/p PPM insertion.  2. PPM - her St. Jude DDD PM is working normally. No change. 3. CAD - she is s/p cath/PCI and has no anginal symptoms.  4. Diastolic heart failure - her symptoms are class 2. She will continue her current meds. Her EF is 75%.  Mikle Bosworth.D.

## 2019-07-12 NOTE — Patient Instructions (Signed)
Medication Instructions:  Your physician recommends that you continue on your current medications as directed. Please refer to the Current Medication list given to you today.  Labwork: None ordered.  Testing/Procedures: None ordered.  Follow-Up: Your physician wants you to follow-up in: one year with Dr. Lovena Le.   You will receive a reminder letter in the mail two months in advance. If you don't receive a letter, please call our office to schedule the follow-up appointment.  Remote monitoring is used to monitor your Pacemaker from home. This monitoring reduces the number of office visits required to check your device to one time per year. It allows [name] to keep an eye on the functioning of your device to ensure it is working properly. You are scheduled for a device check from home on 10/10/2018. You may send your transmission at any time that day. If you have a wireless device, the transmission will be sent automatically. After your physician reviews your transmission, you will receive a postcard with your next transmission date.  Any Other Special Instructions Will Be Listed Below (If Applicable).  If you need a refill on your cardiac medications before your next appointment, please call your pharmacy.

## 2019-07-16 ENCOUNTER — Other Ambulatory Visit: Payer: Self-pay

## 2019-07-16 MED ORDER — PAROXETINE HCL 20 MG PO TABS: 20.0000 mg | ORAL_TABLET | Freq: Every day | ORAL | 3 refills | Status: DC

## 2019-07-18 DIAGNOSIS — F3176 Bipolar disorder, in full remission, most recent episode depressed: Secondary | ICD-10-CM | POA: Diagnosis not present

## 2019-07-18 DIAGNOSIS — I130 Hypertensive heart and chronic kidney disease with heart failure and stage 1 through stage 4 chronic kidney disease, or unspecified chronic kidney disease: Secondary | ICD-10-CM | POA: Diagnosis not present

## 2019-07-18 DIAGNOSIS — N182 Chronic kidney disease, stage 2 (mild): Secondary | ICD-10-CM | POA: Diagnosis not present

## 2019-07-18 DIAGNOSIS — F339 Major depressive disorder, recurrent, unspecified: Secondary | ICD-10-CM | POA: Diagnosis not present

## 2019-07-18 DIAGNOSIS — F3170 Bipolar disorder, currently in remission, most recent episode unspecified: Secondary | ICD-10-CM | POA: Diagnosis not present

## 2019-07-18 DIAGNOSIS — I5032 Chronic diastolic (congestive) heart failure: Secondary | ICD-10-CM | POA: Diagnosis not present

## 2019-07-18 DIAGNOSIS — D631 Anemia in chronic kidney disease: Secondary | ICD-10-CM | POA: Diagnosis not present

## 2019-07-24 ENCOUNTER — Encounter: Payer: Medicare Other | Admitting: Women's Health

## 2019-08-06 ENCOUNTER — Encounter: Payer: Self-pay | Admitting: Neurology

## 2019-08-13 ENCOUNTER — Other Ambulatory Visit: Payer: Self-pay

## 2019-08-13 MED ORDER — MIRTAZAPINE 7.5 MG PO TABS: 7.5000 mg | ORAL_TABLET | Freq: Every day | ORAL | 3 refills | Status: DC

## 2019-08-13 MED ORDER — TOVIAZ 8 MG PO TB24: 8.0000 mg | ORAL_TABLET | Freq: Every day | ORAL | 3 refills | Status: DC

## 2019-08-14 ENCOUNTER — Other Ambulatory Visit (HOSPITAL_COMMUNITY): Payer: Self-pay

## 2019-08-14 MED ORDER — ROSUVASTATIN CALCIUM 40 MG PO TABS: 40.0000 mg | ORAL_TABLET | Freq: Every day | ORAL | 0 refills | Status: DC

## 2019-08-15 ENCOUNTER — Telehealth: Payer: Medicare Other | Admitting: Neurology

## 2019-08-20 ENCOUNTER — Other Ambulatory Visit: Payer: Self-pay

## 2019-08-20 DIAGNOSIS — Z20822 Contact with and (suspected) exposure to covid-19: Secondary | ICD-10-CM

## 2019-08-20 DIAGNOSIS — Z20828 Contact with and (suspected) exposure to other viral communicable diseases: Secondary | ICD-10-CM | POA: Diagnosis not present

## 2019-08-22 ENCOUNTER — Encounter: Payer: Medicare Other | Admitting: Women's Health

## 2019-08-22 LAB — NOVEL CORONAVIRUS, NAA: SARS-CoV-2, NAA: NOT DETECTED

## 2019-08-29 ENCOUNTER — Ambulatory Visit (INDEPENDENT_AMBULATORY_CARE_PROVIDER_SITE_OTHER): Payer: Medicare Other | Admitting: *Deleted

## 2019-08-29 DIAGNOSIS — R001 Bradycardia, unspecified: Secondary | ICD-10-CM

## 2019-09-01 LAB — CUP PACEART REMOTE DEVICE CHECK
Battery Remaining Longevity: 122 mo
Battery Remaining Percentage: 95.5 %
Battery Voltage: 3.01 V
Brady Statistic AP VP Percent: 1 %
Brady Statistic AP VS Percent: 86 %
Brady Statistic AS VP Percent: 1 %
Brady Statistic AS VS Percent: 14 %
Brady Statistic RA Percent Paced: 86 %
Brady Statistic RV Percent Paced: 1 %
Date Time Interrogation Session: 20201123032826
Implantable Lead Implant Date: 20190614
Implantable Lead Implant Date: 20190614
Implantable Lead Location: 753859
Implantable Lead Location: 753860
Implantable Pulse Generator Implant Date: 20190614
Lead Channel Impedance Value: 530 Ohm
Lead Channel Impedance Value: 550 Ohm
Lead Channel Pacing Threshold Amplitude: 0.5 V
Lead Channel Pacing Threshold Amplitude: 0.875 V
Lead Channel Pacing Threshold Pulse Width: 0.5 ms
Lead Channel Pacing Threshold Pulse Width: 0.5 ms
Lead Channel Sensing Intrinsic Amplitude: 12 mV
Lead Channel Sensing Intrinsic Amplitude: 2.6 mV
Lead Channel Setting Pacing Amplitude: 1.125
Lead Channel Setting Pacing Amplitude: 2 V
Lead Channel Setting Pacing Pulse Width: 0.5 ms
Lead Channel Setting Sensing Sensitivity: 2 mV
Pulse Gen Model: 2272
Pulse Gen Serial Number: 9031934

## 2019-09-12 DIAGNOSIS — N182 Chronic kidney disease, stage 2 (mild): Secondary | ICD-10-CM | POA: Diagnosis not present

## 2019-09-12 DIAGNOSIS — I5032 Chronic diastolic (congestive) heart failure: Secondary | ICD-10-CM | POA: Diagnosis not present

## 2019-09-12 DIAGNOSIS — Z20828 Contact with and (suspected) exposure to other viral communicable diseases: Secondary | ICD-10-CM | POA: Diagnosis not present

## 2019-09-12 DIAGNOSIS — I130 Hypertensive heart and chronic kidney disease with heart failure and stage 1 through stage 4 chronic kidney disease, or unspecified chronic kidney disease: Secondary | ICD-10-CM | POA: Diagnosis not present

## 2019-09-12 DIAGNOSIS — I129 Hypertensive chronic kidney disease with stage 1 through stage 4 chronic kidney disease, or unspecified chronic kidney disease: Secondary | ICD-10-CM | POA: Diagnosis not present

## 2019-09-12 DIAGNOSIS — Z87891 Personal history of nicotine dependence: Secondary | ICD-10-CM | POA: Diagnosis not present

## 2019-09-12 DIAGNOSIS — K219 Gastro-esophageal reflux disease without esophagitis: Secondary | ICD-10-CM | POA: Diagnosis not present

## 2019-09-12 DIAGNOSIS — F3170 Bipolar disorder, currently in remission, most recent episode unspecified: Secondary | ICD-10-CM | POA: Diagnosis not present

## 2019-09-12 DIAGNOSIS — E039 Hypothyroidism, unspecified: Secondary | ICD-10-CM | POA: Diagnosis not present

## 2019-09-12 DIAGNOSIS — Z79899 Other long term (current) drug therapy: Secondary | ICD-10-CM | POA: Diagnosis not present

## 2019-09-13 DIAGNOSIS — H353132 Nonexudative age-related macular degeneration, bilateral, intermediate dry stage: Secondary | ICD-10-CM | POA: Diagnosis not present

## 2019-09-13 DIAGNOSIS — H52203 Unspecified astigmatism, bilateral: Secondary | ICD-10-CM | POA: Diagnosis not present

## 2019-09-13 DIAGNOSIS — H04123 Dry eye syndrome of bilateral lacrimal glands: Secondary | ICD-10-CM | POA: Diagnosis not present

## 2019-09-13 DIAGNOSIS — H43813 Vitreous degeneration, bilateral: Secondary | ICD-10-CM | POA: Diagnosis not present

## 2019-10-01 ENCOUNTER — Other Ambulatory Visit: Payer: Self-pay

## 2019-10-02 ENCOUNTER — Encounter: Payer: Medicare Other | Admitting: Women's Health

## 2019-10-08 ENCOUNTER — Other Ambulatory Visit: Payer: Self-pay | Admitting: Gastroenterology

## 2019-10-12 DIAGNOSIS — H35453 Secondary pigmentary degeneration, bilateral: Secondary | ICD-10-CM | POA: Diagnosis not present

## 2019-10-12 DIAGNOSIS — Z961 Presence of intraocular lens: Secondary | ICD-10-CM | POA: Diagnosis not present

## 2019-10-12 DIAGNOSIS — H353132 Nonexudative age-related macular degeneration, bilateral, intermediate dry stage: Secondary | ICD-10-CM | POA: Diagnosis not present

## 2019-10-12 DIAGNOSIS — H35721 Serous detachment of retinal pigment epithelium, right eye: Secondary | ICD-10-CM | POA: Diagnosis not present

## 2019-10-12 DIAGNOSIS — H35363 Drusen (degenerative) of macula, bilateral: Secondary | ICD-10-CM | POA: Diagnosis not present

## 2019-10-15 ENCOUNTER — Ambulatory Visit: Payer: Medicare Other | Attending: Internal Medicine

## 2019-10-15 DIAGNOSIS — Z23 Encounter for immunization: Secondary | ICD-10-CM | POA: Diagnosis not present

## 2019-10-15 LAB — CUP PACEART INCLINIC DEVICE CHECK
Date Time Interrogation Session: 20210111134916
Implantable Lead Implant Date: 20190614
Implantable Lead Implant Date: 20190614
Implantable Lead Location: 753859
Implantable Lead Location: 753860
Implantable Pulse Generator Implant Date: 20190614
Pulse Gen Model: 2272
Pulse Gen Serial Number: 9031934

## 2019-10-15 NOTE — Progress Notes (Signed)
   Covid-19 Vaccination Clinic  Name:  Meghan Oliver    MRN: [id number] DOB: 1940/10/09  10/15/2019  [name] was observed post Covid-19 immunization for 30 minutes based on pre-vaccination screening without incidence. She was provided with Vaccine Information Sheet and instruction to access the V-Safe system.   [name] was instructed to call 911 with any severe reactions post vaccine: Marland Kitchen Difficulty breathing  . Swelling of your face and throat  . A fast heartbeat  . A bad rash all over your body  . Dizziness and weakness

## 2019-10-16 LAB — HM MAMMOGRAPHY

## 2019-10-17 ENCOUNTER — Encounter: Payer: Self-pay | Admitting: Internal Medicine

## 2019-11-05 ENCOUNTER — Ambulatory Visit: Payer: Medicare Other | Attending: Internal Medicine

## 2019-11-05 ENCOUNTER — Ambulatory Visit: Payer: Medicare Other

## 2019-11-05 DIAGNOSIS — Z23 Encounter for immunization: Secondary | ICD-10-CM | POA: Insufficient documentation

## 2019-11-05 NOTE — Progress Notes (Signed)
   Covid-19 Vaccination Clinic  Name:  CHARMEL AHOLA    MRN: [id number] DOB: 10-Sep-1941  11/05/2019  Ms. Kram was observed post Covid-19 immunization for 15 minutes without incidence. She was provided with Vaccine Information Sheet and instruction to access the V-Safe system.   Ms. Durst was instructed to call 911 with any severe reactions post vaccine: Marland Kitchen Difficulty breathing  . Swelling of your face and throat  . A fast heartbeat  . A bad rash all over your body  . Dizziness and weakness    Immunizations Administered    Name Date Dose VIS Date Route   Pfizer COVID-19 Vaccine 11/05/2019 10:40 AM 0.3 mL 09/14/2019 Intramuscular   Manufacturer: [place]   Lot: [id number]   [place]: [id number]

## 2019-11-08 DIAGNOSIS — L821 Other seborrheic keratosis: Secondary | ICD-10-CM | POA: Diagnosis not present

## 2019-11-08 DIAGNOSIS — Z23 Encounter for immunization: Secondary | ICD-10-CM | POA: Diagnosis not present

## 2019-11-08 DIAGNOSIS — L720 Epidermal cyst: Secondary | ICD-10-CM | POA: Diagnosis not present

## 2019-11-12 ENCOUNTER — Telehealth: Payer: Self-pay | Admitting: Psychiatry

## 2019-11-12 NOTE — Telephone Encounter (Signed)
Dr. Lyla Son called and would like to to talk to [name] about Bee. Please give him a call at [phone]

## 2019-11-13 ENCOUNTER — Encounter: Payer: Self-pay | Admitting: Women's Health

## 2019-11-13 ENCOUNTER — Ambulatory Visit (INDEPENDENT_AMBULATORY_CARE_PROVIDER_SITE_OTHER): Payer: Medicare Other | Admitting: Women's Health

## 2019-11-13 ENCOUNTER — Telehealth: Payer: Self-pay | Admitting: Psychiatry

## 2019-11-13 ENCOUNTER — Other Ambulatory Visit: Payer: Self-pay

## 2019-11-13 VITALS — BP 110/70 | Ht <= 58 in | Wt 104.0 lb

## 2019-11-13 DIAGNOSIS — Z01419 Encounter for gynecological examination (general) (routine) without abnormal findings: Secondary | ICD-10-CM | POA: Diagnosis not present

## 2019-11-13 DIAGNOSIS — M8589 Other specified disorders of bone density and structure, multiple sites: Secondary | ICD-10-CM

## 2019-11-13 DIAGNOSIS — M85851 Other specified disorders of bone density and structure, right thigh: Secondary | ICD-10-CM

## 2019-11-13 NOTE — Progress Notes (Addendum)
Meghan Oliver 03-Oct-1941 [id number]    History:    Presents for breast and pelvic exam with no complaints.  Postmenopausal on no HRT with no bleeding.  Normal Pap and mammogram history.  08/2017 DEXA at [facility] was somewhat improved -2.1 at femoral neck FRAX 25% / 16%.  Has had Zostavax but not Shingrix.  Has had Pneumovax.  Primary care manages hypercholesteremia, hypertension, anxiety/depression and CAD.  History of electroshock therapy.  Has caregivers Stanton Kidney and Lonie Peak is a nurse who monitors medications, Stanton Kidney does restorative yoga and accompanies her to appointments.  Chronic constipation.  Not sexually active.  Had the Covid vaccine.  Past medical history, past surgical history, family history and social history were all reviewed and documented in the EPIC chart.  2 daughters.  Has a small dog Molly.  ROS:  A ROS was performed and pertinent positives and negatives are included.  Exam:  Vitals:   11/13/19 1402  BP: 110/70  Weight: 104 lb (47.2 kg)  Height: 4\' 10"  (1.473 m)   Body mass index is 21.74 kg/m.   General appearance:  Normal Thyroid:  Symmetrical, normal in size, without palpable masses or nodularity. Respiratory  Auscultation:  Clear without wheezing or rhonchi Cardiovascular  Auscultation:  Regular rate, without rubs, murmurs or gallops  Edema/varicosities:  Not grossly evident Abdominal  Soft,nontender, without masses, guarding or rebound.  Liver/spleen:  No organomegaly noted  Hernia:  None appreciated  Skin  Inspection:  Grossly normal   Breasts: Examined lying and sitting. Bilateral implants    Right: Without masses, retractions, discharge or axillary adenopathy.     Left: Without masses, retractions, discharge or axillary adenopathy. Gentitourinary   Inguinal/mons:  Normal without inguinal adenopathy  External genitalia:  Normal  BUS/Urethra/Skene's glands:  Normal  Vagina:  Normal  Cervix:  Normal  Uterus:  normal in size, shape and contour.  Midline  and mobile  Adnexa/parametria:     Rt: Without masses or tenderness.   Lt: Without masses or tenderness.  Anus and perineum: Normal  Digital rectal exam: Normal sphincter tone without palpated masses or tenderness  Assessment/Plan:  79 y.o. MWF for breast and pelvic exam with no GYN complaints of discharge, problem with chronic constipation.  Postmenopausal/no HRT/no bleeding Chronic constipation currently on Colace Hypertension, CAD, anxiety/depression, hypercholesteremia, hypothyroidism, GERD, -primary care             manages labs and meds  Osteopenia with elevated FRAX History of bradycardia pacemaker placed 03/2018 cardiologist manages  Plan: Current on vaccines but has not had Shingrix only Zostavax, encouraged to get.  Repeat DEXA at [facility] it is due, instructed to schedule.  Aware of importance of weightbearing and balance type exercise, does walk her dog daily and is doing restorative yoga.  Encouraged vitamin D 2000 IUs daily and vitamin K which may help absorption of calcium, calcium rich foods encouraged.  SBEs, continue annual 3D screening mammogram.  Home safety, fall prevention discussed.  Encouraged to try MiraLAX daily and gradually decrease when stools become softer.Huel Cote [facility], 2:33 PM 11/13/2019

## 2019-11-13 NOTE — Telephone Encounter (Signed)
RTC  Went straight to VM again today  LM to RTC to Dr. Casimiro Needle cell.  Lynder Parents, MD, DFAPA

## 2019-11-13 NOTE — Patient Instructions (Addendum)
It was good to see you shingrex vaccine  2 shots for shingles Vit D 2000 iu daily, Vit K  Health Maintenance After Age 80 After age 33, you are at a higher risk for certain long-term diseases and infections as well as injuries from falls. Falls are a major cause of broken bones and head injuries in people who are older than age 30. Getting regular preventive care can help to keep you healthy and well. Preventive care includes getting regular testing and making lifestyle changes as recommended by your health care provider. Talk with your health care provider about:  Which screenings and tests you should have. A screening is a test that checks for a disease when you have no symptoms.  A diet and exercise plan that is right for you. What should I know about screenings and tests to prevent falls? Screening and testing are the best ways to find a health problem early. Early diagnosis and treatment give you the best chance of managing medical conditions that are common after age 59. Certain conditions and lifestyle choices may make you more likely to have a fall. Your health care provider may recommend:  Regular vision checks. Poor vision and conditions such as cataracts can make you more likely to have a fall. If you wear glasses, make sure to get your prescription updated if your vision changes.  Medicine review. Work with your health care provider to regularly review all of the medicines you are taking, including over-the-counter medicines. Ask your health care provider about any side effects that may make you more likely to have a fall. Tell your health care provider if any medicines that you take make you feel dizzy or sleepy.  Osteoporosis screening. Osteoporosis is a condition that causes the bones to get weaker. This can make the bones weak and cause them to break more easily.  Blood pressure screening. Blood pressure changes and medicines to control blood pressure can make you feel  dizzy.  Strength and balance checks. Your health care provider may recommend certain tests to check your strength and balance while standing, walking, or changing positions.  Foot health exam. Foot pain and numbness, as well as not wearing proper footwear, can make you more likely to have a fall.  Depression screening. You may be more likely to have a fall if you have a fear of falling, feel emotionally low, or feel unable to do activities that you used to do.  Alcohol use screening. Using too much alcohol can affect your balance and may make you more likely to have a fall. What actions can I take to lower my risk of falls? General instructions  Talk with your health care provider about your risks for falling. Tell your health care provider if: ? You fall. Be sure to tell your health care provider about all falls, even ones that seem minor. ? You feel dizzy, sleepy, or off-balance.  Take over-the-counter and prescription medicines only as told by your health care provider. These include any supplements.  Eat a healthy diet and maintain a healthy weight. A healthy diet includes low-fat dairy products, low-fat (lean) meats, and fiber from whole grains, beans, and lots of fruits and vegetables. Home safety  Remove any tripping hazards, such as rugs, cords, and clutter.  Install safety equipment such as grab bars in bathrooms and safety rails on stairs.  Keep rooms and walkways well-lit. Activity   Follow a regular exercise program to stay fit. This will help you maintain your  balance. Ask your health care provider what types of exercise are appropriate for you.  If you need a cane or walker, use it as recommended by your health care provider.  Wear supportive shoes that have nonskid soles. Lifestyle  Do not drink alcohol if your health care provider tells you not to drink.  If you drink alcohol, limit how much you have: ? 0-1 drink a day for women. ? 0-2 drinks a day for  men.  Be aware of how much alcohol is in your drink. In the [place] one drink equals one typical bottle of beer (12 oz), one-half glass of wine (5 oz), or one shot of hard liquor (1 oz).  Do not use any products that contain nicotine or tobacco, such as cigarettes and e-cigarettes. If you need help quitting, ask your health care provider. Summary  Having a healthy lifestyle and getting preventive care can help to protect your health and wellness after age 18.  Screening and testing are the best way to find a health problem early and help you avoid having a fall. Early diagnosis and treatment give you the best chance for managing medical conditions that are more common for people who are older than age 33.  Falls are a major cause of broken bones and head injuries in people who are older than age 54. Take precautions to prevent a fall at home.  Work with your health care provider to learn what changes you can make to improve your health and wellness and to prevent falls. This information is not intended to replace advice given to you by your health care provider. Make sure you discuss any questions you have with your health care provider. Document Revised: 01/11/2019 Document Reviewed: 08/03/2017 Elsevier Patient Education  2020 [employer].

## 2019-11-15 ENCOUNTER — Telehealth: Payer: Self-pay | Admitting: Psychiatry

## 2019-11-15 NOTE — Telephone Encounter (Signed)
Telephone call from patient's husband [name] on 11/14/2019  Patient is scheduled for ECT soon for maintenance ECT.  He thinks it is been 2-1/2 months since the last ECT but he is going to check the date for sure.  It is still be done being done at [facility].  He is wondering about skipping this treatment because the patient has continued to be free of depression and seems cognitively clearer as these ECT treatments have been spread out further from each other.  Patient remains on medications as prescribed.  If it is truly been over 2 months since the last ECT then it is reasonable to consider discontinuing ECT.  It is unlikely that ECT with the frequency of greater than 2 months is significantly helpful at preventing her relapse.  If however the ECT frequency is less than every 2 months it could still be helping to prevent recurrence.  He indicated he would consider this information and discuss it with her ECT team and make a decision.  They are heading to [place] in the next few weeks.  Needs to schedule an appointment with me for follow-up because it is been many months.  He agrees.  Lynder Parents MD, DFAPA

## 2019-12-18 ENCOUNTER — Other Ambulatory Visit: Payer: Self-pay | Admitting: Psychiatry

## 2020-01-01 ENCOUNTER — Ambulatory Visit: Payer: Medicare Other | Admitting: Neurology

## 2020-01-10 ENCOUNTER — Encounter: Payer: Self-pay | Admitting: Cardiovascular Disease

## 2020-01-21 ENCOUNTER — Telehealth: Payer: Self-pay

## 2020-01-21 NOTE — Telephone Encounter (Signed)
Received documents from [place] to be filled out I called Joelene Millin the nurse to let know that patient needs appt in order for [name] to fill out paperwork she told me to call patient's husband called with no answer left detailed message.

## 2020-01-24 NOTE — Progress Notes (Signed)
Assessment/Plan:    1.  Tremor, lithium-induced  -Has been off of lithium in the past and tremor did abate.  Tremor overall mild today  -Patient thinks primidone has been helpful.  We will continue primidone, 50 mg in the morning and 100 mg at night.  Looks like primidone being refilled by [name].  -pt asked me a few times for RF of the ativan.  States that she is using it for tremor control.  Told her that I don't want her using that for tremor.  Would not want to sacrifice cognition/balance for tremor control. 2.  Memory change  -Last neurocognitive testing in January, 2019 with evidence of dementia  -ECT has affected her memory.  -History of alcohol use.  Had d/c EtOH last time but husband states drinking some socially now.  He asks me about that.  I would like to see total abstinence and we discussed reasoning and husband agrees but pt somewhat frustrated 3.  Syncope  -Resolved once patient had PPM placed 4.  Major depressive disorder  -Doing well and has held the ECT for now.  -Following with psychiatry. 5.  F/u prn since primidone being refilled by psychiatry  Subjective:   Meghan Oliver was seen today in follow up for tremor.  My previous records were reviewed prior to todays visit.  Pt accompanied by her husband, [name],  who supplements the hx. I have not seen the patient since November, 2019.  She is under the care of [name].  She has actually not seen him in a while and when they contacted him in February, he did note that it was time to make an appointment.  She is no longer doing ECT because she has been doing well, and because it affected her memory. She actually doesn't remember ever being at this office.  Doesn't remember taking multiple trips with husband but he does state that her memory is better than it used to be.  Current prescribed movement disorder medications: Primidone, 50 mg, 1 tablet in the morning and 2 tablets at night   ALLERGIES:   Allergies    Allergen Reactions  . Propranolol Other (See Comments)    Low blood pressure  . Brexpiprazole Other (See Comments)    Aphasia and catatonia    CURRENT MEDICATIONS:  Outpatient Encounter Medications as of 01/28/2020  Medication Sig  . acetaminophen (TYLENOL) 500 MG tablet Take 1,000 mg by mouth every 6 (six) hours as needed for mild pain or headache.   . Calcium Carbonate-Vitamin D (CALCIUM 600/VITAMIN D PO) Take 1 tablet by mouth 2 (two) times daily.   . cetirizine (ZYRTEC) 10 MG tablet Take 10 mg by mouth daily as needed for allergies.   . Cholecalciferol (VITAMIN D-3) 5000 units TABS Take 5,000 Units by mouth daily with breakfast.   . cloNIDine (CATAPRES) 0.1 MG tablet TAKE 1 TABLET BY MOUTH DAILY AS NEEDED.( TAKE BEFORE ECT TREATMENTS)  . famotidine (PEPCID) 20 MG tablet Take 1 tablet (20 mg total) by mouth at bedtime.  . fesoterodine (TOVIAZ) 8 MG TB24 tablet Take 1 tablet (8 mg total) by mouth daily.  . irbesartan (AVAPRO) 150 MG tablet Take 150 mg by mouth daily.  Marland Kitchen L-Methylfolate-B12-B6-B2 (CEREFOLIN PO) Take by mouth.  . lactulose (CHRONULAC) 10 GM/15ML solution TAKE 15 ML BY MOUTH DAILY AS NEEDED FOR MILD CONSTIPATION  . Lavender Oil 80 MG CAPS Take 160 mg by mouth at bedtime.  Marland Kitchen levothyroxine (SYNTHROID) 75 MCG tablet TAKE  1 TABLET(75 MCG) BY MOUTH DAILY  . lithium carbonate 150 MG capsule Take as directed on M,W,F  . lithium carbonate 300 MG capsule Take 1 capsule (300 mg total) by mouth at bedtime. (Patient taking differently: Take 300 mg by mouth at bedtime. 300mg  Sunday, Tuesday, Thursday and Saturday, 450mg  Monday, Wednesday and Friday.)  . LORazepam (ATIVAN) 0.5 MG tablet Take 1/2-1 tablet by mouth at bedtime, take 1 every 8 hours as needed for anxiety.  . memantine (NAMENDA) 10 MG tablet TAKE ONE TABLET BY MOUTH TWICE DAILY   . Methylfol-Algae-B12-Acetylcyst (CEREFOLIN NAC) 6-90.314-2-600 MG TABS TAKE 1 CAPLET BY MOUTH THREE TIMES A DAY (Patient taking differently: 2 (two)  times daily. )  . mirtazapine (REMERON) 7.5 MG tablet Take 1 tablet (7.5 mg total) by mouth at bedtime.  . Multiple Vitamin (MULTIVITAMIN WITH MINERALS) TABS tablet Take 1 tablet by mouth daily.  . Multiple Vitamins-Minerals (MULTIVITAMIN WITH MINERALS) tablet Take 1 tablet by mouth daily.    . nortriptyline (PAMELOR) 25 MG capsule Take 3 capsules (75 mg total) by mouth at bedtime.  Marland Kitchen nystatin-triamcinolone (MYCOLOG II) cream Apply 1 application topically 2 (two) times daily. (Patient taking differently: Apply 1 application topically as needed. )  . Omega-3 Fatty Acids (OMEGA 3 PO) Take 1 capsule by mouth at bedtime.  Marland Kitchen omeprazole (PRILOSEC) 40 MG capsule TAKE 1 CAPSULE BY MOUTH BEFORE DINNER  . OVER THE COUNTER MEDICATION Take 1 tablet by mouth 2 (two) times daily. *Cocavia*  . PARoxetine (PAXIL) 20 MG tablet Take 1 tablet (20 mg total) by mouth at bedtime.  . polyethylene glycol (MIRALAX / GLYCOLAX) 17 g packet Take 17 g by mouth as needed.  . primidone (MYSOLINE) 50 MG tablet 2 tablets in the morning, 1 tablet in the evening  . Pyridoxine HCl (VITAMIN B-6) 500 MG tablet Take 500 mg by mouth 2 (two) times daily.  . ranitidine (ZANTAC) 150 MG tablet TAKE 1 TABLET(150 MG) BY MOUTH AT BEDTIME  . rosuvastatin (CRESTOR) 40 MG tablet Take 1 tablet (40 mg total) by mouth daily. Needs appt  . sennosides-docusate sodium (SENOKOT-S) 8.6-50 MG tablet Take 2 tablets by mouth 2 (two) times daily.   . vitamin B-12 (CYANOCOBALAMIN) 1000 MCG tablet Take 1,000 mcg by mouth at bedtime.  . nitroGLYCERIN (NITROSTAT) 0.4 MG SL tablet Place 1 tablet (0.4 mg total) under the tongue every 5 (five) minutes as needed for chest pain.  . [DISCONTINUED] aspirin EC 81 MG tablet Take 1 tablet (81 mg total) by mouth daily. (Patient not taking: Reported on 01/28/2020)   No facility-administered encounter medications on file as of 01/28/2020.     Objective:    PHYSICAL EXAMINATION:    VITALS:   Vitals:   01/28/20 1137    BP: 133/73  Pulse: 60  SpO2: 98%  Weight: 104 lb (47.2 kg)  Height: 4\' 10"  (1.473 m)   Wt Readings from Last 3 Encounters:  01/28/20 104 lb (47.2 kg)  11/13/19 104 lb (47.2 kg)  07/12/19 103 lb (46.7 kg)     GEN:  The patient appears stated age and is in NAD. HEENT:  Normocephalic, atraumatic.  The mucous membranes are moist. The superficial temporal arteries are without ropiness or tenderness. CV:  RRR Lungs:  CTAB Neck/HEME:  There are no carotid bruits bilaterally.  Neurological examination:  Orientation: The patient is alert and oriented to person and place Cranial nerves: There is good facial symmetry. The speech is fluent and clear. Soft palate rises symmetrically and  there is no tongue deviation. Hearing is intact to conversational tone. Sensation: Sensation is intact to light touch throughout Motor: Strength is at least antigravity x4.  Movement examination: Tone: There is normal tone in the UE/LE Abnormal movements: very mild tremor of outstretched hands.  No worse when given a weight. Coordination:  There is no decremation with RAM's, with any form of RAMS, including alternating supination and pronation of the forearm, hand opening and closing, finger taps, heel taps and toe taps. Gait and Station: The patient has no difficulty arising out of a deep-seated chair without the use of the hands. The patient's stride length is wide based but she does well in the hall.    Total time spent on today's visit was 30 minutes, including both face-to-face time and nonface-to-face time.  Time included that spent on review of records (prior notes available to me/labs/imaging if pertinent), discussing treatment and goals, answering patient's questions and coordinating care.  Cc:  [name], MD

## 2020-01-28 ENCOUNTER — Encounter: Payer: Self-pay | Admitting: Neurology

## 2020-01-28 ENCOUNTER — Other Ambulatory Visit: Payer: Self-pay

## 2020-01-28 ENCOUNTER — Ambulatory Visit (INDEPENDENT_AMBULATORY_CARE_PROVIDER_SITE_OTHER): Payer: Medicare Other | Admitting: Neurology

## 2020-01-28 VITALS — BP 133/73 | HR 60 | Ht <= 58 in | Wt 104.0 lb

## 2020-01-28 DIAGNOSIS — F331 Major depressive disorder, recurrent, moderate: Secondary | ICD-10-CM | POA: Diagnosis not present

## 2020-01-28 DIAGNOSIS — G251 Drug-induced tremor: Secondary | ICD-10-CM

## 2020-01-28 DIAGNOSIS — R413 Other amnesia: Secondary | ICD-10-CM

## 2020-01-28 NOTE — Patient Instructions (Addendum)
1.  No changes in your primidone 2.  I would recommend no alcohol  The physicians and staff at [facility] are committed to providing excellent care. You may receive a survey requesting feedback about your experience at our office. We strive to receive "very good" responses to the survey questions. If you feel that your experience would prevent you from giving the office a "very good " response, please contact our office to try to remedy the situation. We may be reached at [phone]. Thank you for taking the time out of your busy day to complete the survey.

## 2020-01-30 ENCOUNTER — Other Ambulatory Visit: Payer: Self-pay

## 2020-01-30 ENCOUNTER — Ambulatory Visit (INDEPENDENT_AMBULATORY_CARE_PROVIDER_SITE_OTHER): Payer: Medicare Other | Admitting: Internal Medicine

## 2020-01-30 ENCOUNTER — Encounter: Payer: Self-pay | Admitting: Internal Medicine

## 2020-01-30 VITALS — BP 90/60 | HR 73 | Temp 98.8°F | Ht <= 58 in | Wt 102.0 lb

## 2020-01-30 DIAGNOSIS — F3181 Bipolar II disorder: Secondary | ICD-10-CM

## 2020-01-30 DIAGNOSIS — F319 Bipolar disorder, unspecified: Secondary | ICD-10-CM

## 2020-01-30 DIAGNOSIS — R413 Other amnesia: Secondary | ICD-10-CM

## 2020-01-30 DIAGNOSIS — F331 Major depressive disorder, recurrent, moderate: Secondary | ICD-10-CM

## 2020-01-30 DIAGNOSIS — Z029 Encounter for administrative examinations, unspecified: Secondary | ICD-10-CM

## 2020-01-30 LAB — POCT URINALYSIS DIPSTICK
Appearance: NEGATIVE
Bilirubin, UA: NEGATIVE
Blood, UA: NEGATIVE
Glucose, UA: NEGATIVE
Ketones, UA: NEGATIVE
Leukocytes, UA: NEGATIVE
Nitrite, UA: NEGATIVE
Odor: NEGATIVE
Protein, UA: NEGATIVE
Spec Grav, UA: 1.02 (ref 1.010–1.025)
Urobilinogen, UA: 0.2 E.U./dL
pH, UA: 6 (ref 5.0–8.0)

## 2020-01-30 NOTE — Addendum Note (Signed)
Addended by: Mady Haagensen on: 01/30/2020 02:42 PM   Modules accepted: Orders

## 2020-01-30 NOTE — Patient Instructions (Signed)
It was a pleasure to see you today.  Levels drawn for lithium and nortriptyline with results pending.  Physical exam is due in late August 2021.

## 2020-01-30 NOTE — Progress Notes (Signed)
   Subjective:    Patient ID: Meghan Oliver, female    DOB: April 25, 1941, 79 y.o.   MRN: [id number]  HPI 79 year old Female with History of HTN, carotid disease followed by Cardiology, Bipolar 2 disorder seen by [name], history of major depression, irritable bowel syndrome with constipation, GE reflux, urinary frequency, hyperlipidemia and hypothyroidism.  History of left lower lobe pneumonia 2015.  Appendectomy 1971.  History of bilateral tubal ligation and left ovarian cystectomy.  History of bilateral breast implants.  History of obstructive sleep apnea.  Longstanding history of depression starting in her 41s after the birth of her first child.  She has a pacemaker and is followed by cardiology.  Has GYN care through Meghan Oliver, Nurse Practitioner Social history: Married.  Husband is retired Copywriter, advertising.  Does not smoke.  Quit smoking in 1972.  2 adult daughters, 1 lives in [place] and one lives in [place].  Family history: She only has 1 sibling, a brother who has been diagnosed with dementia.  Father with history of bipolar disorder died at age 61 of an MI.  Mother died with history of COPD.  She looks great.  She is pleasant and cooperative.  Sees Dr. Carles Oliver for mild tremor which is thought to be lithium induced.  She has had 2 COVID-19 vaccines.  Have not repeated tetanus immunization.  If she has an injury, we will repeat Tdap.  Generally, Tdap not covered by Medicare unless there is an injury.   Review of Systems no new complaints  Husband indicates patient needs nortriptyline and lithium levels drawn today     Objective:   Physical Exam Blood pressure 90/60.  Pulse 73.  Temperature 98.8 degrees orally pulse oximetry 97% weight 102 pounds BMI 21.32 height 4 feet 10 inches  Skin warm and dry.  Mild bilateral hand tremor.  Nodes none.  Neck is supple without JVD thyromegaly or carotid bruits.  Chest clear to auscultation.  Breasts not examined.  Cardiac exam regular  rate and rhythm normal S1 and S2 without murmurs or gallops.  Abdomen soft nondistended without hepatosplenomegaly masses or tenderness.  No lower extremity edema.  Affect is normal.  Pleasant and cooperative.       Assessment & Plan:  Essential hypertension-stable  Hyperlipidemia-treated with statin  History of bilateral carotid disease followed by cardiology  History of pacemaker  History of Bipolar 2 disorder followed by [name].  ECT at [facility] has been discontinued.  She has done well thus far.  History of bilateral breast implants  History of sleep apnea  GE reflux treated with PPI  Irritable bowel syndrome with constipation  Hypothyroidism-treated with thyroid replacement therapy  Plan: Medicare wellness visit and health maintenance due late August.  Form completed for [employer] EF 25 fax pring.  Nortriptyline and lithium levels drawn.

## 2020-02-02 LAB — LITHIUM LEVEL: Lithium Lvl: 0.7 mmol/L (ref 0.6–1.2)

## 2020-02-02 LAB — NORTRIPTYLINE LEVEL: Nortriptyline Lvl: 70 mcg/L (ref 50–150)

## 2020-02-04 ENCOUNTER — Other Ambulatory Visit (HOSPITAL_COMMUNITY): Payer: Self-pay

## 2020-02-18 DIAGNOSIS — H353132 Nonexudative age-related macular degeneration, bilateral, intermediate dry stage: Secondary | ICD-10-CM | POA: Diagnosis not present

## 2020-02-18 DIAGNOSIS — H35721 Serous detachment of retinal pigment epithelium, right eye: Secondary | ICD-10-CM | POA: Diagnosis not present

## 2020-02-18 DIAGNOSIS — H5315 Visual distortions of shape and size: Secondary | ICD-10-CM | POA: Diagnosis not present

## 2020-02-18 DIAGNOSIS — Z961 Presence of intraocular lens: Secondary | ICD-10-CM | POA: Diagnosis not present

## 2020-02-18 DIAGNOSIS — H35453 Secondary pigmentary degeneration, bilateral: Secondary | ICD-10-CM | POA: Diagnosis not present

## 2020-02-18 DIAGNOSIS — H35363 Drusen (degenerative) of macula, bilateral: Secondary | ICD-10-CM | POA: Diagnosis not present

## 2020-02-19 ENCOUNTER — Other Ambulatory Visit: Payer: Self-pay

## 2020-02-19 ENCOUNTER — Ambulatory Visit (INDEPENDENT_AMBULATORY_CARE_PROVIDER_SITE_OTHER): Payer: Medicare Other | Admitting: Psychiatry

## 2020-02-19 DIAGNOSIS — G250 Essential tremor: Secondary | ICD-10-CM | POA: Diagnosis not present

## 2020-02-19 DIAGNOSIS — G251 Drug-induced tremor: Secondary | ICD-10-CM

## 2020-02-19 DIAGNOSIS — F3181 Bipolar II disorder: Secondary | ICD-10-CM | POA: Diagnosis not present

## 2020-02-19 NOTE — Progress Notes (Signed)
Meghan Oliver [id number] [birth year] 79 y.o.  Subjective:   Patient ID:  Meghan Oliver is a 79 y.o. (DOB [birth year]) female. Patient was last seen August 21, 2018 Chief Complaint:  Chief Complaint  Patient presents with  . Follow-up    severe depression with catatonia  . Tremors   Meghan Oliver presents to the office today for follow-up of Severe TR bipolar depression with psychotic features including catatonia.  11/30/2018 was the last visit and the following was noted: Had ECT yesterday at [facility].  Last was Jan 8 and was doing well then.  Next scheduled April 15.  Had a lithium level from there which is pending. Pretty good until the last 10 days to 2 weeks with less energy and motivation.  Bothered by old sick dog.  Worried about how that will affect her.  Has slept with the dog.  Enjoyed [place].  Going to gym and playing [place].  Took the class and didn't feel she caught on quickly. Wonders if it is bc of the ECT memory effects.  Bridge is harder and not playing.  Planning to return to [facility] mid April.   spent the winter in [place] per usual..  No significant depression since here. ECT frequency about 6 weeks.  Gets very anxious the day of the ECT.  ECT consistently for a year.  Best in mood in 7-8 years.  Also the pacemaker helped.  Friends notice the benefit.  Plan:  Option increase in the nortriptyline if needed.  Considered this.  They agree and would like to increase nortriptyline to 75 to try to reduce tendency to depression before the ECT.   03/19/19  Addendum: Here the contents from an email from the nurse for [name]  Hello [name],  I hope you and your family are healthy and well. I took [facility] to [facility] for ECT and here are labs from 01/26/19. I am going to also forward them to [facility] for follow up with her LFT's. They are still higher than I would like. Her lithium level was 0.51 on a daily dose of 300mg  qohs x 4 nights- alternating with 450mg  qohs x 3 nights. She is  not demonstrating any signs of elevated levels, minimal hand tremors, less anxiety and restlessness since her ECT treatment. Her Nortryptyline level was 140 on 75mg  qhs. I would like to continue her current dosages of both the above meds and recheck her lithium level at her next scheduled ECT of 03/23/19. Please let me know of any changes you would like to make. Take care, [name] RN Lithium level was 0.4 on the 450mg  alt with 300 mg QOD. No changes were made in meds.  11/15/2019 phone call: Telephone call from patient's husband [name] on 11/14/2019 Patient is scheduled for ECT soon for maintenance ECT.  He thinks it is been 2-1/2 months since the last ECT but he is going to check the date for sure.  It is still be done being done [facility]  He is wondering about skipping this treatment because the patient has continued to be free of depression and seems cognitively clearer as these ECT treatments have been spread out further from each other.  Patient remains on medications as prescribed. If it is truly been over 2 months since the last ECT then it is reasonable to consider discontinuing ECT.  It is unlikely that ECT with the frequency of greater than 2 months is significantly helpful at preventing her  relapse.  If however the ECT frequency is less than every 2 months it could still be helping to prevent recurrence.  He indicated he would consider this information and discuss it with her ECT team and make a decision.  They are heading to [place] in the next few weeks. Needs to schedule an appointment with me for follow-up because it is been many months.  He agrees.  02/19/2020 appt, the following noted: Moving to [place] in a few months.  Ready to downsize.   Stopped ECT as discussed and has not been more deprressed.   Walks dog 4 times daily.   Lithium 0.7 on  01/30/20 on lithium 300 mg plus 150 mg on M, W, F Nortriptyline 70 on 75 mg HS. No SE except tremor.  Balance is not  great.  Very happy.   No depression since here.  No mood swings.  Sleep and appetite is OK.    Patient reports stable mood and denies depressed or irritable moods.  Patient denies any recent difficulty with anxiety except as noted.  Patient denies difficulty with sleep initiation or maintenance. Denies appetite disturbance.  Patient reports that energy and motivation have been good, except the last couple of weeks.    Patient has some difficulty with concentration.  Patient denies any suicidal ideation.  Usually takes Ativan at night and sleeps well 8 hours.    No current alcohol problems  Saw Dr. Carles Collet last week and no change in neuro meds.  Past Psychiatric Medication Trials: Prozac with lithium and Zyprexa 2.5 to 5 mg, nortriptyline, paroxetine, ,  pramipexole, mirtazapine, Namenda, lorazepam,  Rexulti, Abilify, Vraylar, Latuda 10 mg, lamotrigine.   this loss list is not exhaustive   Review of Systems:  Review of Systems  Neurological: Negative for tremors and weakness.  Psychiatric/Behavioral: Negative for agitation, behavioral problems, confusion, decreased concentration, dysphoric mood, hallucinations, self-injury, sleep disturbance and suicidal ideas. The patient is not nervous/anxious and is not hyperactive.     Medications: I have reviewed the patient's current medications.  Current Outpatient Medications  Medication Sig Dispense Refill  . Calcium Carbonate-Vitamin D (CALCIUM 600/VITAMIN D PO) Take 1 tablet by mouth 2 (two) times daily.     . cetirizine (ZYRTEC) 10 MG tablet Take 10 mg by mouth daily as needed for allergies.     . Cholecalciferol (VITAMIN D-3) 5000 units TABS Take 5,000 Units by mouth daily with breakfast.     . cloNIDine (CATAPRES) 0.1 MG tablet TAKE 1 TABLET BY MOUTH DAILY AS NEEDED.( TAKE BEFORE ECT TREATMENTS) 10 tablet 1  . fesoterodine (TOVIAZ) 8 MG TB24 tablet Take 1 tablet (8 mg total) by mouth daily. 90 tablet 3  . irbesartan (AVAPRO) 150 MG tablet Take  150 mg by mouth daily.    Marland Kitchen L-Methylfolate-B12-B6-B2 (CEREFOLIN PO) Take by mouth.    . lactulose (CHRONULAC) 10 GM/15ML solution TAKE 15 ML BY MOUTH DAILY AS NEEDED FOR MILD CONSTIPATION 1350 mL 1  . Lavender Oil 80 MG CAPS Take 160 mg by mouth at bedtime.    Marland Kitchen levothyroxine (SYNTHROID) 75 MCG tablet TAKE 1 TABLET(75 MCG) BY MOUTH DAILY 90 tablet 3  . lithium carbonate 150 MG capsule Take as directed on M,W,F 45 capsule 3  . lithium carbonate 300 MG capsule Take 1 capsule (300 mg total) by mouth at bedtime. (Patient taking differently: Take 300 mg by mouth at bedtime. 300mg  Sunday, Tuesday, Thursday and Saturday, 450mg  Monday, Wednesday and Friday.) 90 capsule 3  . LORazepam (ATIVAN)  0.5 MG tablet Take 1/2-1 tablet by mouth at bedtime, take 1 every 8 hours as needed for anxiety. 180 tablet 0  . memantine (NAMENDA) 10 MG tablet TAKE ONE TABLET BY MOUTH TWICE DAILY  180 tablet 0  . Methylfol-Algae-B12-Acetylcyst (CEREFOLIN NAC) 6-90.314-2-600 MG TABS TAKE 1 CAPLET BY MOUTH THREE TIMES A DAY (Patient taking differently: 2 (two) times daily. ) 270 tablet 0  . mirtazapine (REMERON) 7.5 MG tablet Take 1 tablet (7.5 mg total) by mouth at bedtime. 90 tablet 3  . Multiple Vitamin (MULTIVITAMIN WITH MINERALS) TABS tablet Take 1 tablet by mouth daily.    . Multiple Vitamins-Minerals (MULTIVITAMIN WITH MINERALS) tablet Take 1 tablet by mouth daily.      . nortriptyline (PAMELOR) 25 MG capsule Take 3 capsules (75 mg total) by mouth at bedtime. 270 capsule 3  . nystatin-triamcinolone (MYCOLOG II) cream Apply 1 application topically 2 (two) times daily. (Patient taking differently: Apply 1 application topically as needed. ) 30 g 0  . Omega-3 Fatty Acids (OMEGA 3 PO) Take 1 capsule by mouth at bedtime.    Marland Kitchen omeprazole (PRILOSEC) 40 MG capsule TAKE 1 CAPSULE BY MOUTH BEFORE DINNER 30 capsule 6  . OVER THE COUNTER MEDICATION Take 1 tablet by mouth 2 (two) times daily. *Cocavia*    . PARoxetine (PAXIL) 20 MG tablet  Take 1 tablet (20 mg total) by mouth at bedtime. 90 tablet 3  . polyethylene glycol (MIRALAX / GLYCOLAX) 17 g packet Take 17 g by mouth as needed.    . primidone (MYSOLINE) 50 MG tablet 2 tablets in the morning, 1 tablet in the evening 270 tablet 3  . Pyridoxine HCl (VITAMIN B-6) 500 MG tablet Take 500 mg by mouth 2 (two) times daily.    . rosuvastatin (CRESTOR) 40 MG tablet Take 1 tablet (40 mg total) by mouth daily. Needs appt 90 tablet 0  . vitamin B-12 (CYANOCOBALAMIN) 1000 MCG tablet Take 1,000 mcg by mouth at bedtime.     No current facility-administered medications for this visit.    Medication Side Effects: Other: tremor stable.  No worse.  Allergies:  Allergies  Allergen Reactions  . Propranolol Other (See Comments)    Low blood pressure  . Brexpiprazole Other (See Comments)    Aphasia and catatonia    Past Medical History:  Diagnosis Date  . Anxiety   . Arthritis    "hips, spine" (03/17/2018)  . Bipolar II disorder ([place])   . Chronic bronchitis ([place])   . Chronic lower back pain   . Chronic right hip pain   . CKD (chronic kidney disease), stage II   . Esophagitis, erosive   . GAD (generalized anxiety disorder)   . GERD (gastroesophageal reflux disease)   . Headache    "maybe monthly" (03/17/2018))  . Heart murmur, systolic   . History of adenomatous polyp of colon    08-04-2016  tubular adenoma  . History of blood transfusion 12/2017   "related to vascular hematoma"  . History of electroconvulsive therapy    at [place]--  started 04-15-2015 to 11-17-2016  total greater than 40 times  . History of hiatal hernia   . Hyperlipidemia   . Hypertension   . Hypothyroidism   . Internal carotid artery stenosis, bilateral    per last duplex 05-01-2014  bilateral ICA 40-59%  . Major depression, chronic    ECT treatments extensive and multiple started 07/ 2016  . Memory loss    "both short and long-term; needs frequent  reminders to follow instrucitons" (05/16/2017)  .  Migraines    "none in years" (03/17/2018)  . OSA (obstructive sleep apnea)    per study 06/ 2012 moderate OSA  ; "refuses to wear masks" (03/17/2018)  . Osteoporosis   . Pulmonary nodule    monitored by pcp  . S/P placement of cardiac pacemaker 03/17/18 [facility]  03/18/2018    Family History  Problem Relation Age of Onset  . Heart attack Father 79       deceased  . Hypertension Father   . Heart disease Father   . Breast cancer Paternal Grandmother        Age unknown  . Breast cancer Paternal Aunt        Age 17's  . Colon cancer Neg Hx     Social History   Socioeconomic History  . Marital status: Married    Spouse name: Dr. Lyla Son  . Number of children: 2  . Years of education: Not on file  . Highest education level: Not on file  Occupational History  . Occupation: housewife    Employer: UNEMPLOYED  Tobacco Use  . Smoking status: Former Smoker    Packs/day: 2.00    Years: 15.00    Pack years: 30.00    Types: Cigarettes    Quit date: 01/13/1971    Years since quitting: 49.1  . Smokeless tobacco: Never Used  Substance and Sexual Activity  . Alcohol use: Yes    Alcohol/week: 0.0 standard drinks    Comment: occassional  . Drug use: Never  . Sexual activity: Not Currently    Comment: intercourse age 3, sexual partners less than 5  Other Topics Concern  . Not on file  Social History Narrative  . Not on file   Social Determinants of Health   Financial Resource Strain:   . Difficulty of Paying Living Expenses:   Food Insecurity:   . Worried About Charity fundraiser in the Last Year:   . Arboriculturist in the Last Year:   Transportation Needs:   . Film/video editor (Medical):   Marland Kitchen Lack of Transportation (Non-Medical):   Physical Activity:   . Days of Exercise per Week:   . Minutes of Exercise per Session:   Stress:   . Feeling of Stress :   Social Connections:   . Frequency of Communication with Friends and Family:   . Frequency of Social Gatherings  with Friends and Family:   . Attends Religious Services:   . Active Member of Clubs or Organizations:   . Attends Archivist Meetings:   Marland Kitchen Marital Status:   Intimate Partner Violence:   . Fear of Current or Ex-Partner:   . Emotionally Abused:   Marland Kitchen Physically Abused:   . Sexually Abused:     Past Medical History, Surgical history, Social history, and Family history were reviewed and updated as appropriate.   Please see review of systems for further details on the patient's review from today.   Objective:   Physical Exam:  LMP  (LMP Unknown)   Physical Exam Constitutional:      General: She is not in acute distress.    Appearance: She is well-developed.  Musculoskeletal:        General: No deformity.  Neurological:     Mental Status: She is alert and oriented to person, place, and time.     Motor: No tremor.     Coordination: Coordination normal.  Psychiatric:  Attention and Perception: She is attentive.        Mood and Affect: Mood is not anxious or depressed. Affect is not labile, blunt, angry or inappropriate.        Speech: Speech is not slurred.        Behavior: Behavior normal. Behavior is not slowed or withdrawn.        Thought Content: Thought content normal. Thought content does not include homicidal or suicidal ideation. Thought content does not include homicidal or suicidal plan.        Cognition and Memory: She exhibits impaired recent memory.        Judgment: Judgment normal.     Comments: Insight intact. No auditory or visual hallucinations.  meticulous appearance bright affect     Lab Review:     Component Value Date/Time   NA 136 05/15/2019 1215   NA 136 04/20/2017 1151   K 4.5 05/15/2019 1215   CL 100 05/15/2019 1215   CO2 26 05/15/2019 1215   GLUCOSE 74 05/15/2019 1215   BUN 25 05/15/2019 1215   BUN 24 04/20/2017 1151   CREATININE 1.01 (H) 05/15/2019 1215   CALCIUM 10.3 05/15/2019 1215   PROT 7.3 05/15/2019 1215   PROT 6.2  04/20/2017 1151   ALBUMIN 3.9 08/08/2017 1615   ALBUMIN 3.9 04/20/2017 1151   AST 55 (H) 05/15/2019 1215   ALT 82 (H) 05/15/2019 1215   ALKPHOS 46 08/08/2017 1615   BILITOT 0.5 05/15/2019 1215   BILITOT <0.2 04/20/2017 1151   GFRNONAA 53 (L) 05/15/2019 1215   GFRAA 61 05/15/2019 1215       Component Value Date/Time   WBC 6.3 05/15/2019 1215   RBC 4.15 05/15/2019 1215   HGB 13.7 05/15/2019 1215   HGB 10.2 (L) 04/20/2017 1151   HCT 40.8 05/15/2019 1215   HCT 29.6 (L) 04/20/2017 1151   PLT 243 05/15/2019 1215   PLT 385 (H) 04/20/2017 1151   MCV 98.3 05/15/2019 1215   MCV 89 04/20/2017 1151   MCH 33.0 05/15/2019 1215   MCHC 33.6 05/15/2019 1215   RDW 12.0 05/15/2019 1215   RDW 13.9 04/20/2017 1151   LYMPHSABS 1,764 05/15/2019 1215   LYMPHSABS 1.5 04/20/2017 1151   MONOABS 0.5 03/09/2017 1232   EOSABS 82 05/15/2019 1215   EOSABS 0.3 04/20/2017 1151   BASOSABS 38 05/15/2019 1215   BASOSABS 0.0 04/20/2017 1151    Lithium Lvl  Date Value Ref Range Status  01/30/2020 0.7 0.6 - 1.2 mmol/L Final  Nortriptyline level 51 on 50 mg nightly with paroxetine 20 mg daily  No results found for: PHENYTOIN, PHENOBARB, VALPROATE, CBMZ   .res Assessment: Plan:    Bipolar II disorder ([place]) - Plan: Lithium level  Lithium-induced tremor  Essential tremor   History of lithium toxicity repeatedly.  Greater than 50% of face to face time with patient was spent on counseling and coordination of care.  Her severe treatment resistant bipolar depression is currently in remission.  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  During the severe depression she has catatonia.  She has required longterm ECT.  The last episode of depression approximately 10 years stayed in remission on Paxil and nortriptyline and lithium for a period of years until the lithium level was decreased due to tremor.  This time we are maintaining an adequate lithium level.  .   Depression and anxiety much improved.     She seems to be tolerating the  meds well other than the lithium tremor which is being fairly  managed with primidone.  She is continuing under the care of her neurologist Dr. Carles Collet.  As noted Dr. Exie Parody and I discussed her EC T and agreed that she could discontinue ECT.  From records it appears her last ECT was in September 2020.  Her depression has remained in remission since that time.  The nortriptyline was increased to 75 to try to reduce tendency to depression in February 2020.  Levels are noted.  Lithium 0.7 on  01/30/20 on lithium 300 mg plus 150 mg on M, W, F Nortriptyline 70 on 75 mg HS.  Lithium level goals are between 0.5 and 0.7.  She has a history of lithium toxicity when she was taking chlorthalidone with the lithium.   No med changes are indicated today  This appointment was 40 minutes.  Follow-up 3 months  Lynder Parents MD, DFAPA Please see After Visit Summary for patient specific instructions.  Future Appointments  Date Time Provider [place]  02/27/2020  8:40 AM [facility] DEVICE REMOTES [facility]  05/14/2020  3:00 PM Cottle, Billey Co., MD [facility] None  05/28/2020  8:40 AM [facility] DEVICE REMOTES [facility]  08/27/2020  8:40 AM [facility] DEVICE REMOTES [facility]    Orders Placed This Encounter  Procedures  . Lithium level      -------------------------------

## 2020-02-20 ENCOUNTER — Encounter: Payer: Self-pay | Admitting: Psychiatry

## 2020-02-28 ENCOUNTER — Telehealth: Payer: Self-pay

## 2020-02-28 NOTE — Telephone Encounter (Signed)
Left message for patient to remind of missed remote transmission.  

## 2020-03-11 ENCOUNTER — Other Ambulatory Visit: Payer: Self-pay | Admitting: Psychiatry

## 2020-03-11 NOTE — Telephone Encounter (Signed)
Next apt 08/11

## 2020-03-13 ENCOUNTER — Other Ambulatory Visit (HOSPITAL_COMMUNITY): Payer: Self-pay | Admitting: Cardiology

## 2020-03-13 MED ORDER — ROSUVASTATIN CALCIUM 40 MG PO TABS: 40.0000 mg | ORAL_TABLET | Freq: Every day | ORAL | 0 refills | Status: DC

## 2020-03-21 ENCOUNTER — Other Ambulatory Visit: Payer: Self-pay

## 2020-03-21 ENCOUNTER — Telehealth: Payer: Self-pay | Admitting: Internal Medicine

## 2020-03-21 MED ORDER — CLOTRIMAZOLE-BETAMETHASONE 1-0.05 % EX CREA: 1.0000 "application " | TOPICAL_CREAM | Freq: Two times a day (BID) | CUTANEOUS | 0 refills | Status: DC

## 2020-03-21 MED ORDER — MEMANTINE HCL 10 MG PO TABS: 10.0000 mg | ORAL_TABLET | Freq: Two times a day (BID) | ORAL | 0 refills | Status: DC

## 2020-03-21 NOTE — Telephone Encounter (Signed)
She said yes it is in the corner of her mouth, lotrisone cream was sent.

## 2020-03-21 NOTE — Telephone Encounter (Signed)
Is this in the corner of her mouth? If so, it is usually a yeast infection that will respond to Lotrisone cream twice daily. We may have prescribed that for her before. If she needs new tube go ahead and re-prescribe.

## 2020-03-21 NOTE — Telephone Encounter (Signed)
Meghan Oliver [phone]  [name] called to say she has a sore on the right side of her mouth.

## 2020-04-03 ENCOUNTER — Other Ambulatory Visit: Payer: Self-pay | Admitting: Psychiatry

## 2020-04-03 DIAGNOSIS — Z79899 Other long term (current) drug therapy: Secondary | ICD-10-CM

## 2020-04-03 DIAGNOSIS — F3181 Bipolar II disorder: Secondary | ICD-10-CM

## 2020-04-03 NOTE — Progress Notes (Signed)
Patient's husband called stating that her tremor was a little worse and she was having some stutter. No evidence of stroke otherwise. First thing would be to check serum lithium level and BMP.  Order written.  Have asked her husband to let us know about the lab to which it should be sent.

## 2020-04-09 ENCOUNTER — Other Ambulatory Visit: Payer: Self-pay | Admitting: Surgery

## 2020-04-09 DIAGNOSIS — I2510 Atherosclerotic heart disease of native coronary artery without angina pectoris: Secondary | ICD-10-CM

## 2020-04-10 ENCOUNTER — Telehealth: Payer: Self-pay

## 2020-04-10 NOTE — Telephone Encounter (Signed)
Called and spoke with [name]. Informed her of information below. Patient scheduled for Monday 04/14/2020 for labs.

## 2020-04-10 NOTE — Telephone Encounter (Signed)
Any day next week except Wednesday

## 2020-04-10 NOTE — Telephone Encounter (Signed)
[name], care giver for patient, calling and states that Dr Clovis Pu would like labs drawn but they do not draw labs at his office. States that she believes it is a Lithium level, BMP, and CMP. Would like to know if they can be drawn at Dr Verlene Mayer? Please advise. CB#: [phone]

## 2020-04-14 ENCOUNTER — Other Ambulatory Visit: Payer: Medicare Other | Admitting: Internal Medicine

## 2020-04-14 ENCOUNTER — Other Ambulatory Visit: Payer: Self-pay

## 2020-04-14 DIAGNOSIS — Z79899 Other long term (current) drug therapy: Secondary | ICD-10-CM

## 2020-04-14 DIAGNOSIS — F3181 Bipolar II disorder: Secondary | ICD-10-CM | POA: Diagnosis not present

## 2020-04-14 NOTE — Addendum Note (Signed)
Addended by: Mady Haagensen on: 04/14/2020 11:29 AM   Modules accepted: Orders

## 2020-04-15 LAB — COMPLETE METABOLIC PANEL WITH GFR
AG Ratio: 1.8 (calc) (ref 1.0–2.5)
ALT: 106 U/L — ABNORMAL HIGH (ref 6–29)
AST: 64 U/L — ABNORMAL HIGH (ref 10–35)
Albumin: 4.1 g/dL (ref 3.6–5.1)
Alkaline phosphatase (APISO): 61 U/L (ref 37–153)
BUN/Creatinine Ratio: 21 (calc) (ref 6–22)
BUN: 19 mg/dL (ref 7–25)
CO2: 27 mmol/L (ref 20–32)
Calcium: 9.4 mg/dL (ref 8.6–10.4)
Chloride: 108 mmol/L (ref 98–110)
Creat: 0.89 mg/dL — ABNORMAL HIGH (ref 0.60–0.88)
GFR, Est African American: 71 mL/min/{1.73_m2} (ref >=60)
GFR, Est Non African American: 61 mL/min/{1.73_m2} (ref >=60)
Globulin: 2.3 g/dL (calc) (ref 1.9–3.7)
Glucose, Bld: 90 mg/dL (ref 65–99)
Potassium: 4.7 mmol/L (ref 3.5–5.3)
Sodium: 139 mmol/L (ref 135–146)
Total Bilirubin: 0.5 mg/dL (ref 0.2–1.2)
Total Protein: 6.4 g/dL (ref 6.1–8.1)

## 2020-04-15 LAB — LITHIUM LEVEL: Lithium Lvl: 1 mmol/L (ref 0.6–1.2)

## 2020-04-22 ENCOUNTER — Telehealth: Payer: Self-pay | Admitting: Psychiatry

## 2020-04-22 NOTE — Telephone Encounter (Signed)
Lithium level is not dangerously high but it is has crept up to 1.0 which is higher than desired for her.  Our goal for her is 0.5-0.7.  At the current level it is likely causing side effects so we should reduce the dosage from lithium 300 mg plus 150 mg on M, W, F  TO 300 mg daily.  Meaning drop off the 150 mg capsule.  It will take about 2 weeks for the level to gradually come down to the desired level.

## 2020-04-22 NOTE — Telephone Encounter (Signed)
[name] called and said that Meghan Oliver had her labs done yesterday so he should be able to see the results. If he cabn't see them please call kim and she will fax them over

## 2020-04-22 NOTE — Telephone Encounter (Signed)
LM for [name] to call back with instructions for decrease.

## 2020-04-23 NOTE — Telephone Encounter (Signed)
Put her on the cancellation list for 30 minutes 

## 2020-05-08 ENCOUNTER — Encounter: Payer: Self-pay | Admitting: Psychiatry

## 2020-05-08 ENCOUNTER — Other Ambulatory Visit: Payer: Self-pay

## 2020-05-08 ENCOUNTER — Ambulatory Visit (INDEPENDENT_AMBULATORY_CARE_PROVIDER_SITE_OTHER): Payer: Medicare Other | Admitting: Psychiatry

## 2020-05-08 DIAGNOSIS — F3181 Bipolar II disorder: Secondary | ICD-10-CM | POA: Diagnosis not present

## 2020-05-08 DIAGNOSIS — I2510 Atherosclerotic heart disease of native coronary artery without angina pectoris: Secondary | ICD-10-CM | POA: Diagnosis not present

## 2020-05-08 DIAGNOSIS — G3184 Mild cognitive impairment, so stated: Secondary | ICD-10-CM

## 2020-05-08 DIAGNOSIS — G251 Drug-induced tremor: Secondary | ICD-10-CM | POA: Diagnosis not present

## 2020-05-08 DIAGNOSIS — Z79899 Other long term (current) drug therapy: Secondary | ICD-10-CM

## 2020-05-08 DIAGNOSIS — G250 Essential tremor: Secondary | ICD-10-CM

## 2020-05-08 MED ORDER — MEMANTINE HCL 10 MG PO TABS: 10.0000 mg | ORAL_TABLET | Freq: Two times a day (BID) | ORAL | 3 refills | Status: DC

## 2020-05-08 NOTE — Progress Notes (Signed)
Meghan Oliver [id number] 1941-07-16 79 y.o.  Subjective:   Patient ID:  Meghan Oliver is a 79 y.o. (DOB [birth year]) female. Patient was last seen August 21, 2018 Chief Complaint:  Chief Complaint  Patient presents with  . Follow-up    TRD  . Medication Problem   [facility] presents to the office today for follow-up of Severe TR bipolar depression with psychotic features including catatonia.  11/30/2018 was the last visit and the following was noted: Had ECT yesterday at [facility].  Last was Jan 8 and was doing well then.  Next scheduled April 15.  Had a lithium level from there which is pending. Pretty good until the last 10 days to 2 weeks with less energy and motivation.  Bothered by old sick dog.  Worried about how that will affect her.  Has slept with the dog.  Enjoyed [place].  Going to gym and playing [place].  Took the class and didn't feel she caught on quickly. Wonders if it is bc of the ECT memory effects.  Bridge is harder and not playing.  Planning to return to [place] mid April.   spent the winter in [place] per usual..  No significant depression since here. ECT frequency about 6 weeks.  Gets very anxious the day of the ECT.  ECT consistently for a year.  Best in mood in 7-8 years.  Also the pacemaker helped.  Friends notice the benefit.  Plan:  Option increase in the nortriptyline if needed.  Considered this.  They agree and would like to increase nortriptyline to 75 to try to reduce tendency to depression before the ECT.   03/19/19  Addendum: Here the contents from an email from the nurse for [facility]  Hello [facility] Oliver,  I hope you and your family are healthy and well. I took [facility] to [facility] for ECT and here are labs from 01/26/19. I am going to also forward them to [facility] for follow up with her LFT's. They are still higher than I would like. Her lithium level was 0.51 on a daily dose of 300mg  qohs x 4 nights- alternating with 450mg  qohs x 3 nights. She is not demonstrating  any signs of elevated levels, minimal hand tremors, less anxiety and restlessness since her ECT treatment. Her Nortryptyline level was 140 on 75mg  qhs. I would like to continue her current dosages of both the above meds and recheck her lithium level at her next scheduled ECT of 03/23/19. Please let me know of any changes you would like to make. Take care, Meghan Quan RN Lithium level was 0.4 on the 450mg  alt with 300 mg QOD. No changes were made in meds.  11/15/2019 phone call: Telephone call from patient's husband [facility] on 11/14/2019 Patient is scheduled for ECT soon for maintenance ECT.  He thinks it is been 2-1/2 months since the last ECT but he is going to check the date for sure.  It is still be done being done at [facility].  He is wondering about skipping this treatment because the patient has continued to be free of depression and seems cognitively clearer as these ECT treatments have been spread out further from each other.  Patient remains on medications as prescribed. If it is truly been over 2 months since the last ECT then it is reasonable to consider discontinuing ECT.  It is unlikely that ECT with the frequency of greater than 2 months is significantly helpful at preventing her relapse.  If however the ECT frequency is less than every 2 months it could still be helping to prevent recurrence.  He indicated he would consider this information and discuss it with her ECT team and make a decision.  They are heading to [place] in the next few weeks. Needs to schedule an appointment with me for follow-up because it is been many months.  He agrees.  02/19/2020 appt, the following noted: Moving to [place] in a few months.  Ready to downsize.   Stopped ECT as discussed and has not been more deprressed.   Walks dog 4 times daily.   Lithium 0.7 on  01/30/20 on lithium 300 mg plus 150 mg on M, W, F Nortriptyline 70 on 75 mg HS. No SE except tremor.  Balance is not Meg[facility].  Very happy.    No depression since here.  No mood swings.  Sleep and appetite is OK.  No med changes:  04/03/2020 TC with the following noted: Patient's husband called stating that her tremor was a little worse and she was having some stutter. No evidence of stroke otherwise. First thing would be to check serum lithium level and BMP.  Order written.  Have asked her husband to let us know about the lab to which it should be sent.  04/22/20 TC witht he following noted: Lithium level is not dangerously high but it is has crept up to 1.0 which is higher than desired for her.  Our goal for her is 0.5-0.7.  At the current level it is likely causing side effects so we should reduce the dosage from lithium 300 mg plus 150 mg on M, W, F  TO 300 mg daily.  Meaning drop off the 150 mg capsule.  It will take about 2 weeks for the level to gradually come down to the desired level.  05/08/2020 appt with the following noted: Seen with H and [facility] nurse. Moving to [place] and feels OK about it.  I think I'll be fine.   Pretty good with balance.  Doing yoga and it helps. [facility] Oliver has cancer. Old dog 20 & 1/2 yo still living. [facility]'s concerns when went to [facility] for D's BD she had difficulty time with tremor lethargy, more confusion.  Better when go back home. [facility] notes using more lorazepam. At times gait is unsteady. Not significantly depressed.  Can be anxious at times.  Eating okay.  Sleeping okay.  Is easily confused under stress.   Patient reports stable mood and denies depressed or irritable moods.  Patient denies any recent difficulty with anxiety except as noted.  Patient denies difficulty with sleep initiation or maintenance. Denies appetite disturbance.    Patient has some difficulty with concentration.  Patient denies any suicidal ideation.  Usually takes Ativan at night and sleeps well 8 hours.    No current alcohol problems  Saw Dr. Carles Collet last week and no change in neuro meds.  Past Psychiatric Medication  Trials: Prozac with lithium and Zyprexa 2.5 to 5 mg, nortriptyline, paroxetine, ,  pramipexole, mirtazapine, Namenda, lorazepam,  Rexulti, Abilify, Vraylar, Latuda 10 mg, lamotrigine.   this loss list is not exhaustive  Review of Systems:  Review of Systems  Constitutional: Negative for unexpected weight change.  Neurological: Positive for tremors. Negative for weakness.  Psychiatric/Behavioral: Negative for agitation, behavioral problems, confusion, decreased concentration, dysphoric mood, hallucinations, self-injury, sleep disturbance and suicidal ideas. The patient is not nervous/anxious and is not hyperactive.     Medications: I have reviewed  the patient's current medications.  Current Outpatient Medications  Medication Sig Dispense Refill  . Calcium Carbonate-Vitamin D (CALCIUM 600/VITAMIN D PO) Take 1 tablet by mouth 2 (two) times daily.     . cetirizine (ZYRTEC) 10 MG tablet Take 10 mg by mouth daily as needed for allergies.     . Cholecalciferol (VITAMIN D-3) 5000 units TABS Take 5,000 Units by mouth daily with breakfast.     . cloNIDine (CATAPRES) 0.1 MG tablet TAKE 1 TABLET BY MOUTH DAILY AS NEEDED.( TAKE BEFORE ECT TREATMENTS) 10 tablet 1  . clotrimazole-betamethasone (LOTRISONE) cream Apply 1 application topically 2 (two) times daily. 30 g 0  . fesoterodine (TOVIAZ) 8 MG TB24 tablet Take 1 tablet (8 mg total) by mouth daily. 90 tablet 3  . irbesartan (AVAPRO) 150 MG tablet Take 150 mg by mouth daily.    Marland Kitchen L-Methylfolate-B12-B6-B2 (CEREFOLIN PO) Take by mouth.    . lactulose (CHRONULAC) 10 GM/15ML solution TAKE 15 ML BY MOUTH DAILY AS NEEDED FOR MILD CONSTIPATION 1350 mL 1  . Lavender Oil 80 MG CAPS Take 160 mg by mouth at bedtime.    Marland Kitchen levothyroxine (SYNTHROID) 75 MCG tablet TAKE 1 TABLET(75 MCG) BY MOUTH DAILY 90 tablet 3  . lithium carbonate 300 MG capsule Take 1 capsule (300 mg total) by mouth at bedtime. (Patient taking differently: Take 300 mg by mouth at bedtime. 300mg   Sunday, Tuesday, Thursday and Saturday, 450mg  Monday, Wednesday and Friday.) 90 capsule 3  . LORazepam (ATIVAN) 0.5 MG tablet TAKE 1/2 TO 1 TABLET BY MOUTH AT BEDTIME AND 1 TABLET EVERY 8 HOURS AS NEEDED FOR ANXIETY 180 tablet 0  . memantine (NAMENDA) 10 MG tablet Take 1 tablet (10 mg total) by mouth 2 (two) times daily. 180 tablet 3  . Methylfol-Algae-B12-Acetylcyst (CEREFOLIN NAC) 6-90.314-2-600 MG TABS TAKE 1 CAPLET BY MOUTH THREE TIMES A DAY (Patient taking differently: 2 (two) times daily. ) 270 tablet 0  . mirtazapine (REMERON) 7.5 MG tablet Take 1 tablet (7.5 mg total) by mouth at bedtime. 90 tablet 3  . Multiple Vitamin (MULTIVITAMIN WITH MINERALS) TABS tablet Take 1 tablet by mouth daily.    . Multiple Vitamins-Minerals (MULTIVITAMIN WITH MINERALS) tablet Take 1 tablet by mouth daily.      . nortriptyline (PAMELOR) 25 MG capsule Take 3 capsules (75 mg total) by mouth at bedtime. 270 capsule 3  . nystatin-triamcinolone (MYCOLOG II) cream Apply 1 application topically 2 (two) times daily. (Patient taking differently: Apply 1 application topically as needed. ) 30 g 0  . Omega-3 Fatty Acids (OMEGA 3 PO) Take 1 capsule by mouth at bedtime.    Marland Kitchen omeprazole (PRILOSEC) 40 MG capsule TAKE 1 CAPSULE BY MOUTH BEFORE DINNER 30 capsule 6  . OVER THE COUNTER MEDICATION Take 1 tablet by mouth 2 (two) times daily. *[place]*    . PARoxetine (PAXIL) 20 MG tablet Take 1 tablet (20 mg total) by mouth at bedtime. 90 tablet 3  . polyethylene glycol (MIRALAX / GLYCOLAX) 17 g packet Take 17 g by mouth as needed.    . primidone (MYSOLINE) 50 MG tablet 2 tablets in the morning, 1 tablet in the evening 270 tablet 3  . Pyridoxine HCl (VITAMIN B-6) 500 MG tablet Take 500 mg by mouth 2 (two) times daily.    . rosuvastatin (CRESTOR) 40 MG tablet Take 1 tablet (40 mg total) by mouth daily. Please call for an office visit [phone] 90 tablet 0  . vitamin B-12 (CYANOCOBALAMIN) 1000 MCG tablet Take 1,000  mcg by mouth at  bedtime.    . donepezil (ARICEPT) 5 MG tablet Take 1 tablet (5 mg total) by mouth at bedtime. 30 tablet 0   No current facility-administered medications for this visit.    Medication Side Effects: Other: tremor stable.  No worse.  Allergies:  Allergies  Allergen Reactions  . Propranolol Other (See Comments)    Low blood pressure  . Brexpiprazole Other (See Comments)    Aphasia and catatonia    Past Medical History:  Diagnosis Date  . Anxiety   . Arthritis    "hips, spine" (03/17/2018)  . Bipolar II disorder ([place])   . Chronic bronchitis ([place])   . Chronic lower back pain   . Chronic right hip pain   . CKD (chronic kidney disease), stage II   . Esophagitis, erosive   . GAD (generalized anxiety disorder)   . GERD (gastroesophageal reflux disease)   . Headache    "maybe monthly" (03/17/2018))  . Heart murmur, systolic   . History of adenomatous polyp of colon    08-04-2016  tubular adenoma  . History of blood transfusion 12/2017   "related to vascular hematoma"  . History of electroconvulsive therapy    at [place] [facility]--  started 04-15-2015 to 11-17-2016  total greater than 40 times  . History of hiatal hernia   . Hyperlipidemia   . Hypertension   . Hypothyroidism   . Internal carotid artery stenosis, bilateral    per last duplex 05-01-2014  bilateral ICA 40-59%  . Major depression, chronic    ECT treatments extensive and multiple started 07/ 2016  . Memory loss    "both short and long-term; needs frequent reminders to follow instrucitons" (05/16/2017)  . Migraines    "none in years" (03/17/2018)  . OSA (obstructive sleep apnea)    per study 06/ 2012 moderate OSA  ; "refuses to wear masks" (03/17/2018)  . Osteoporosis   . Pulmonary nodule    monitored by pcp  . S/P placement of cardiac pacemaker 03/17/18 [facility]  03/18/2018    Family History  Problem Relation Age of Onset  . Heart attack Father 36       deceased  . Hypertension Father   . Heart disease Father   . Breast  cancer Paternal Grandmother        Age unknown  . Breast cancer Paternal Aunt        Age 41's  . Colon cancer Neg Hx     Social History   Socioeconomic History  . Marital status: Married    Spouse name: Dr. Lyla Son  . Number of children: 2  . Years of education: Not on file  . Highest education level: Not on file  Occupational History  . Occupation: housewife    Employer: UNEMPLOYED  Tobacco Use  . Smoking status: Former Smoker    Packs/day: 2.00    Years: 15.00    Pack years: 30.00    Types: Cigarettes    Quit date: 01/13/1971    Years since quitting: 49.3  . Smokeless tobacco: Never Used  Vaping Use  . Vaping Use: Never used  Substance and Sexual Activity  . Alcohol use: Yes    Alcohol/week: 0.0 standard drinks    Comment: occassional  . Drug use: Never  . Sexual activity: Not Currently    Comment: intercourse age 66, sexual partners less than 5  Other Topics Concern  . Not on file  Social History Narrative  . Not on  file   Social Determinants of Health   Financial Resource Strain:   . Difficulty of Paying Living Expenses:   Food Insecurity:   . Worried About Charity fundraiser in the Last Year:   . Arboriculturist in the Last Year:   Transportation Needs:   . Film/video editor (Medical):   Marland Kitchen Lack of Transportation (Non-Medical):   Physical Activity:   . Days of Exercise per Week:   . Minutes of Exercise per Session:   Stress:   . Feeling of Stress :   Social Connections:   . Frequency of Communication with Friends and Family:   . Frequency of Social Gatherings with Friends and Family:   . Attends Religious Services:   . Active Member of Clubs or Organizations:   . Attends Archivist Meetings:   Marland Kitchen Marital Status:   Intimate Partner Violence:   . Fear of Current or Ex-Partner:   . Emotionally Abused:   Marland Kitchen Physically Abused:   . Sexually Abused:     Past Medical History, Surgical history, Social history, and Family history were  reviewed and updated as appropriate.   Please see review of systems for further details on the patient's review from today.   Objective:   Physical Exam:  LMP  (LMP Unknown)   Physical Exam Constitutional:      General: She is not in acute distress.    Appearance: She is well-developed.  Musculoskeletal:        General: No deformity.  Neurological:     Mental Status: She is alert and oriented to person, place, and time.     Motor: No tremor.     Coordination: Coordination normal.  Psychiatric:        Attention and Perception: She is attentive.        Mood and Affect: Mood is anxious. Mood is not depressed. Affect is not labile, blunt, angry or inappropriate.        Speech: Speech is not slurred.        Behavior: Behavior normal. Behavior is not slowed or withdrawn.        Thought Content: Thought content normal. Thought content does not include homicidal or suicidal ideation. Thought content does not include homicidal or suicidal plan.        Cognition and Memory: She exhibits impaired recent memory.        Judgment: Judgment normal.     Comments: Insight intact. No auditory or visual hallucinations.  meticulous appearance bright affect     Lab Review:     Component Value Date/Time   NA 139 04/14/2020 1140   NA 136 04/20/2017 1151   K 4.7 04/14/2020 1140   CL 108 04/14/2020 1140   CO2 27 04/14/2020 1140   GLUCOSE 90 04/14/2020 1140   BUN 19 04/14/2020 1140   BUN 24 04/20/2017 1151   CREATININE 0.89 (H) 04/14/2020 1140   CALCIUM 9.4 04/14/2020 1140   PROT 6.4 04/14/2020 1140   PROT 6.2 04/20/2017 1151   ALBUMIN 3.9 08/08/2017 1615   ALBUMIN 3.9 04/20/2017 1151   AST 64 (H) 04/14/2020 1140   ALT 106 (H) 04/14/2020 1140   ALKPHOS 46 08/08/2017 1615   BILITOT 0.5 04/14/2020 1140   BILITOT <0.2 04/20/2017 1151   GFRNONAA 61 04/14/2020 1140   GFRAA 71 04/14/2020 1140       Component Value Date/Time   WBC 6.3 05/15/2019 1215   RBC 4.15 05/15/2019 1215   HGB  13.7 05/15/2019 1215   HGB 10.2 (L) 04/20/2017 1151   HCT 40.8 05/15/2019 1215   HCT 29.6 (L) 04/20/2017 1151   PLT 243 05/15/2019 1215   PLT 385 (H) 04/20/2017 1151   MCV 98.3 05/15/2019 1215   MCV 89 04/20/2017 1151   MCH 33.0 05/15/2019 1215   MCHC 33.6 05/15/2019 1215   RDW 12.0 05/15/2019 1215   RDW 13.9 04/20/2017 1151   LYMPHSABS 1,764 05/15/2019 1215   LYMPHSABS 1.5 04/20/2017 1151   MONOABS 0.5 03/09/2017 1232   EOSABS 82 05/15/2019 1215   EOSABS 0.3 04/20/2017 1151   BASOSABS 38 05/15/2019 1215   BASOSABS 0.0 04/20/2017 1151    Lithium Lvl  Date Value Ref Range Status  04/14/2020 1.0 0.6 - 1.2 mmol/L Final  Nortriptyline level 51 on 50 mg nightly with paroxetine 20 mg daily  No results found for: PHENYTOIN, PHENOBARB, VALPROATE, CBMZ   .res Assessment: Plan:    Bipolar II disorder ([place]) - Plan: Lithium level, Nortriptyline level [[facility]], Comprehensive metabolic panel  Lithium use - Plan: Lithium level, Nortriptyline level [[facility]], Comprehensive metabolic panel  Lithium-induced tremor - Plan: Lithium level, Nortriptyline level [[facility]]  Mild cognitive impairment  Essential tremor    History of lithium toxicity repeatedly.  Greater than 50% of face to face time with patient was spent on counseling and coordination of care.  Her severe treatment resistant bipolar depression is currently in remission.  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  During the severe depression she has catatonia.  She has required longterm ECT.  The last episode of depression approximately 10 years stayed in remission on Paxil and nortriptyline and lithium for a period of years until the lithium level was decreased due to tremor.  This time we are maintaining an adequate lithium level.  .  Depression and anxiety much improved.     She seems to be tolerating the meds well other than the lithium tremor which is being fairly  managed with  primidone.  She is continuing under the care of her neurologist Dr. Carles Collet.  As noted Dr. Exie Parody and I discussed her EC T and agreed that she could discontinue ECT.  From records it appears her last ECT was in September 2020.  Her depression has remained in remission since that time.  The nortriptyline was increased to 75 to try to reduce tendency to depression in February 2020.  Levels are noted.  Lithium 0.7 on  01/30/20 on lithium 300 mg plus 150 mg on M, W, F Nortriptyline 70 on 75 mg HS.  Lithium level goals are between 0.5 and 0.7.  She has a history of lithium toxicity when she was taking chlorthalidone with the lithium.   As noted:Lithium level is not dangerously high but it is has crept up to 1.0 which is higher than desired for her.  Our goal for her is 0.5-0.7.  At the current level it is likely causing side effects so we should reduce the dosage from lithium 300 mg plus 150 mg on M, W, F  TO 300 mg daily.  Meaning drop off the 150 mg capsule.  It will take about 2 weeks for the level to gradually come down to the desired level.  This change was just made in the last week or so. Discussed signs and symptoms of lithium toxicity.  Discussed the risk of increasing depression if the lithium level gets too low.  There remains some concern about drug interactions  between paroxetine and nortriptyline and given her age we do not want the nortriptyline levels higher than necessary so we will repeat nortriptyline level.  Discussed the difficulties they have with travel.  She can get confused with new environments and excessive stimulation.  She does better with routine.  Repeat lithium level and labs after reduction in lithium and nortriptyline level to monitor for SE.  No med changes other than the reduction in lithium to 300 mg daily are indicated today  This appointment was 50 minutes.   Follow-up 3 months  Lynder Parents MD, DFAPA Please see After Visit Summary for patient specific  instructions.  Future Appointments  Date Time Provider [place]  05/28/2020  8:40 AM [facility] DEVICE REMOTES [facility]  07/14/2020  2:00 PM Cottle, Billey Co., MD [facility] None  08/27/2020  8:40 AM [facility] DEVICE REMOTES [facility]    Orders Placed This Encounter  Procedures  . Lithium level  . Nortriptyline level [[facility]]  . Comprehensive metabolic panel      -------------------------------

## 2020-05-09 ENCOUNTER — Other Ambulatory Visit: Payer: Self-pay | Admitting: Psychiatry

## 2020-05-09 MED ORDER — DONEPEZIL HCL 5 MG PO TABS: 5.0000 mg | ORAL_TABLET | Freq: Every day | ORAL | 0 refills | Status: DC

## 2020-05-09 NOTE — Progress Notes (Signed)
Patient restarting donepezil.  We will start at 5 mg daily.  1 would not refill is due will increase to 10 mg.

## 2020-05-11 ENCOUNTER — Other Ambulatory Visit: Payer: Self-pay | Admitting: Psychiatry

## 2020-05-12 NOTE — Telephone Encounter (Signed)
Okay to submit like this? I know she has a different schedule.

## 2020-05-13 ENCOUNTER — Other Ambulatory Visit: Payer: Self-pay

## 2020-05-13 MED ORDER — LEVOTHYROXINE SODIUM 75 MCG PO TABS: ORAL_TABLET | ORAL | 0 refills | Status: DC

## 2020-05-14 ENCOUNTER — Ambulatory Visit: Payer: Medicare Other | Admitting: Psychiatry

## 2020-05-15 ENCOUNTER — Other Ambulatory Visit: Payer: Self-pay | Admitting: Psychiatry

## 2020-05-20 ENCOUNTER — Other Ambulatory Visit: Payer: Self-pay | Admitting: Gastroenterology

## 2020-05-26 DIAGNOSIS — Z23 Encounter for immunization: Secondary | ICD-10-CM | POA: Diagnosis not present

## 2020-06-02 DIAGNOSIS — L821 Other seborrheic keratosis: Secondary | ICD-10-CM | POA: Diagnosis not present

## 2020-06-02 DIAGNOSIS — L578 Other skin changes due to chronic exposure to nonionizing radiation: Secondary | ICD-10-CM | POA: Diagnosis not present

## 2020-06-02 DIAGNOSIS — D225 Melanocytic nevi of trunk: Secondary | ICD-10-CM | POA: Diagnosis not present

## 2020-06-02 DIAGNOSIS — L814 Other melanin hyperpigmentation: Secondary | ICD-10-CM | POA: Diagnosis not present

## 2020-06-02 DIAGNOSIS — Z86018 Personal history of other benign neoplasm: Secondary | ICD-10-CM | POA: Diagnosis not present

## 2020-06-03 ENCOUNTER — Other Ambulatory Visit (HOSPITAL_COMMUNITY): Payer: Self-pay | Admitting: Cardiology

## 2020-06-09 ENCOUNTER — Other Ambulatory Visit: Payer: Self-pay | Admitting: Psychiatry

## 2020-06-12 ENCOUNTER — Other Ambulatory Visit (HOSPITAL_COMMUNITY): Payer: Self-pay | Admitting: Cardiology

## 2020-06-12 ENCOUNTER — Other Ambulatory Visit: Payer: Self-pay | Admitting: Psychiatry

## 2020-06-13 ENCOUNTER — Telehealth: Payer: Self-pay

## 2020-06-13 NOTE — Telephone Encounter (Signed)
Patient called in stating she received a letter that she missed transmission but she states that she doesn't do that she comes in which I do see, my question is where is it documented that she only does in office checks

## 2020-06-23 DIAGNOSIS — H5315 Visual distortions of shape and size: Secondary | ICD-10-CM | POA: Diagnosis not present

## 2020-06-23 DIAGNOSIS — Z961 Presence of intraocular lens: Secondary | ICD-10-CM | POA: Diagnosis not present

## 2020-06-23 DIAGNOSIS — H353132 Nonexudative age-related macular degeneration, bilateral, intermediate dry stage: Secondary | ICD-10-CM | POA: Diagnosis not present

## 2020-06-23 DIAGNOSIS — H35363 Drusen (degenerative) of macula, bilateral: Secondary | ICD-10-CM | POA: Diagnosis not present

## 2020-06-23 DIAGNOSIS — H35453 Secondary pigmentary degeneration, bilateral: Secondary | ICD-10-CM | POA: Diagnosis not present

## 2020-06-23 DIAGNOSIS — H35721 Serous detachment of retinal pigment epithelium, right eye: Secondary | ICD-10-CM | POA: Diagnosis not present

## 2020-06-24 ENCOUNTER — Other Ambulatory Visit: Payer: Self-pay | Admitting: Psychiatry

## 2020-07-08 ENCOUNTER — Telehealth: Payer: Self-pay | Admitting: Internal Medicine

## 2020-07-08 NOTE — Telephone Encounter (Signed)
Yes on a day Reedy is here

## 2020-07-08 NOTE — Telephone Encounter (Signed)
LVM for [facility] to call back and schedule on a morning that [name] is here

## 2020-07-08 NOTE — Telephone Encounter (Signed)
[place]  [name] called to see if she could bring [name] by this week to get some labs that DR Clovis Pu needs, they are listed below. She has his orders and codes.  Lithium level Nortriptyline Level

## 2020-07-09 NOTE — Telephone Encounter (Signed)
[name] called back and scheduled appointment

## 2020-07-10 ENCOUNTER — Other Ambulatory Visit (INDEPENDENT_AMBULATORY_CARE_PROVIDER_SITE_OTHER): Payer: Medicare Other | Admitting: Internal Medicine

## 2020-07-10 ENCOUNTER — Other Ambulatory Visit: Payer: Self-pay

## 2020-07-10 DIAGNOSIS — G251 Drug-induced tremor: Secondary | ICD-10-CM | POA: Diagnosis not present

## 2020-07-10 DIAGNOSIS — F3181 Bipolar II disorder: Secondary | ICD-10-CM

## 2020-07-10 DIAGNOSIS — Z79899 Other long term (current) drug therapy: Secondary | ICD-10-CM

## 2020-07-10 DIAGNOSIS — Z23 Encounter for immunization: Secondary | ICD-10-CM | POA: Diagnosis not present

## 2020-07-10 NOTE — Patient Instructions (Signed)
Flu vaccine per CMA. Labs drawn as requested by [name].

## 2020-07-10 NOTE — Progress Notes (Signed)
Flu vaccine per CMA. Labs drawn as requested by [name].

## 2020-07-12 LAB — COMPLETE METABOLIC PANEL WITH GFR
AG Ratio: 1.9 (calc) (ref 1.0–2.5)
ALT: 48 U/L — ABNORMAL HIGH (ref 6–29)
AST: 40 U/L — ABNORMAL HIGH (ref 10–35)
Albumin: 4.1 g/dL (ref 3.6–5.1)
Alkaline phosphatase (APISO): 57 U/L (ref 37–153)
BUN/Creatinine Ratio: 24 (calc) — ABNORMAL HIGH (ref 6–22)
BUN: 25 mg/dL (ref 7–25)
CO2: 24 mmol/L (ref 20–32)
Calcium: 9.6 mg/dL (ref 8.6–10.4)
Chloride: 104 mmol/L (ref 98–110)
Creat: 1.05 mg/dL — ABNORMAL HIGH (ref 0.60–0.88)
GFR, Est African American: 58 mL/min/{1.73_m2} — ABNORMAL LOW (ref >=60)
GFR, Est Non African American: 50 mL/min/{1.73_m2} — ABNORMAL LOW (ref >=60)
Globulin: 2.2 g/dL (calc) (ref 1.9–3.7)
Glucose, Bld: 123 mg/dL — ABNORMAL HIGH (ref 65–99)
Potassium: 4.5 mmol/L (ref 3.5–5.3)
Sodium: 138 mmol/L (ref 135–146)
Total Bilirubin: 0.3 mg/dL (ref 0.2–1.2)
Total Protein: 6.3 g/dL (ref 6.1–8.1)

## 2020-07-12 LAB — LITHIUM LEVEL: Lithium Lvl: 0.6 mmol/L (ref 0.6–1.2)

## 2020-07-12 LAB — NORTRIPTYLINE LEVEL: Nortriptyline Lvl: 130 mcg/L (ref 50–150)

## 2020-07-14 ENCOUNTER — Ambulatory Visit (INDEPENDENT_AMBULATORY_CARE_PROVIDER_SITE_OTHER): Payer: Medicare Other | Admitting: Psychiatry

## 2020-07-14 ENCOUNTER — Other Ambulatory Visit: Payer: Self-pay | Admitting: Psychiatry

## 2020-07-14 ENCOUNTER — Encounter: Payer: Self-pay | Admitting: Psychiatry

## 2020-07-14 ENCOUNTER — Other Ambulatory Visit: Payer: Self-pay

## 2020-07-14 DIAGNOSIS — F3181 Bipolar II disorder: Secondary | ICD-10-CM | POA: Diagnosis not present

## 2020-07-14 DIAGNOSIS — Z79899 Other long term (current) drug therapy: Secondary | ICD-10-CM

## 2020-07-14 DIAGNOSIS — I2510 Atherosclerotic heart disease of native coronary artery without angina pectoris: Secondary | ICD-10-CM

## 2020-07-14 DIAGNOSIS — G251 Drug-induced tremor: Secondary | ICD-10-CM

## 2020-07-14 DIAGNOSIS — G3184 Mild cognitive impairment, so stated: Secondary | ICD-10-CM

## 2020-07-14 NOTE — Progress Notes (Signed)
Meghan Oliver [id number] 1940-11-14 79 y.o.  Subjective:   Patient ID:  Meghan Oliver is a 79 y.o. (DOB [birth year]) female. Patient was last seen August 21, 2018 Chief Complaint:  Chief Complaint  Patient presents with  . Follow-up    mood and meds  . Depression  . Anxiety  . Memory Loss   Meghan Oliver presents to the office today for follow-up of Severe TR bipolar depression with psychotic features including catatonia.  11/30/2018 was the last visit and the following was noted: Had ECT yesterday at [facility].  Last was Jan 8 and was doing well then.  Next scheduled April 15.  Had a lithium level from there which is pending. Pretty good until the last 10 days to 2 weeks with less energy and motivation.  Bothered by old sick dog.  Worried about how that will affect her.  Has slept with the dog.  Enjoyed [place].  Going to gym and playing [place].  Took the class and didn't feel she caught on quickly. Wonders if it is bc of the ECT memory effects.  Bridge is harder and not playing.  Planning to return to [facility] mid April.   spent the winter in [place] per usual..  No significant depression since here. ECT frequency about 6 weeks.  Gets very anxious the day of the ECT.  ECT consistently for a year.  Best in mood in 7-8 years.  Also the pacemaker helped.  Friends notice the benefit.  Plan:  Option increase in the nortriptyline if needed.  Considered this.  They agree and would like to increase nortriptyline to 75 to try to reduce tendency to depression before the ECT.   03/19/19  Addendum: Here the contents from an email from the nurse for Meghan Oliver  Hello [name],  I hope you and your family are healthy and well. I took Meghan Oliver to [facility] for ECT and here are labs from 01/26/19. I am going to also forward them to [facility] for follow up with her LFT's. They are still higher than I would like. Her lithium level was 0.51 on a daily dose of 300mg  qohs x 4 nights- alternating with 450mg  qohs x 3  nights. She is not demonstrating any signs of elevated levels, minimal hand tremors, less anxiety and restlessness since her ECT treatment. Her Nortryptyline level was 140 on 75mg  qhs. I would like to continue her current dosages of both the above meds and recheck her lithium level at her next scheduled ECT of 03/23/19. Please let me know of any changes you would like to make. Take care, Meghan Quan RN Lithium level was 0.4 on the 450mg  alt with 300 mg QOD. No changes were made in meds.  11/15/2019 phone call: Telephone call from patient's husband Dr. Marga Oliver on 11/14/2019 Patient is scheduled for ECT soon for maintenance ECT.  He thinks it is been 2-1/2 months since the last ECT but he is going to check the date for sure.  It is still be done being done at [facility].  He is wondering about skipping this treatment because the patient has continued to be free of depression and seems cognitively clearer as these ECT treatments have been spread out further from each other.  Patient remains on medications as prescribed. If it is truly been over 2 months since the last ECT then it is reasonable to consider discontinuing ECT.  It is unlikely that ECT with the frequency of greater than 2 months  is significantly helpful at preventing her relapse.  If however the ECT frequency is less than every 2 months it could still be helping to prevent recurrence.  He indicated he would consider this information and discuss it with her ECT team and make a decision.  They are heading to [place] in the next few weeks. Needs to schedule an appointment with me for follow-up because it is been many months.  He agrees.  02/19/2020 appt, the following noted: Moving to [place] in a few months.  Ready to downsize.   Stopped ECT as discussed and has not been more deprressed.   Walks dog 4 times daily.   Lithium 0.7 on  01/30/20 on lithium 300 mg plus 150 mg on M, W, F Nortriptyline 70 on 75 mg HS. No SE except tremor.   Balance is not great.  Very happy.   No depression since here.  No mood swings.  Sleep and appetite is OK.  No med changes:  04/03/2020 TC with the following noted: Patient's husband called stating that her tremor was a little worse and she was having some stutter. No evidence of stroke otherwise. First thing would be to check serum lithium level and BMP.  Order written.  Have asked her husband to let us know about the lab to which it should be sent.  04/22/20 TC witht he following noted: Lithium level is not dangerously high but it is has crept up to 1.0 which is higher than desired for her.  Our goal for her is 0.5-0.7.  At the current level it is likely causing side effects so we should reduce the dosage from lithium 300 mg plus 150 mg on M, W, F  TO 300 mg daily.  Meaning drop off the 150 mg capsule.  It will take about 2 weeks for the level to gradually come down to the desired level.  05/08/2020 appt with the following noted: Seen with H and Meghan Oliver nurse. Moving to [facility] and feels OK about it.  I think I'll be fine.   Pretty good with balance.  Doing yoga and it helps. Meghan Oliver has cancer. Old dog 20 & 1/2 yo still living. Meghan Oliver's concerns when went to [place] for Meghan Oliver BD she had difficulty time with tremor lethargy, more confusion.  Better when go back home. Meghan Oliver notes using more lorazepam. At times gait is unsteady. Not significantly depressed.  Can be anxious at times.  Eating okay.  Sleeping okay.  Is easily confused under stress. Plan: reduce lithium to 300 mg nightly.  07/14/20 appt with the following noted: Seen with her husband as well as her family nurse. Feeling fine and pleased with that.   Some anxiety over the move to smaller place. Meghan Oliver's twin also having heart problems Meghan Oliver. Meghan Oliver's kids came and visited.     Patient reports stable mood and denies depressed or irritable moods.  Patient denies any recent difficulty with anxiety except as noted.  Patient denies difficulty  with sleep initiation or maintenance. Denies appetite disturbance.    Patient has some difficulty with concentration.  Patient denies any suicidal ideation.  Usually takes Ativan at night and sleeps well 8 hours.    No current alcohol problems  Saw Dr. Carles Collet last week and no change in neuro meds.  Past Psychiatric Medication Trials: Prozac with lithium and Zyprexa 2.5 to 5 mg, nortriptyline, paroxetine, ,  pramipexole, mirtazapine, Namenda, lorazepam,  Rexulti, Abilify, Vraylar, Latuda 10 mg, lamotrigine.   this loss list  is not exhaustive  Review of Systems:  Review of Systems  Constitutional: Negative for fatigue and unexpected weight change.  Neurological: Positive for tremors. Negative for weakness.  Psychiatric/Behavioral: Negative for agitation, behavioral problems, confusion, decreased concentration, dysphoric mood, hallucinations, self-injury, sleep disturbance and suicidal ideas. The patient is not nervous/anxious and is not hyperactive.     Medications: I have reviewed the patient's current medications.  Current Outpatient Medications  Medication Sig Dispense Refill  . Calcium Carbonate-Vitamin Meghan Oliver (CALCIUM 600/VITAMIN Meghan Oliver PO) Take 1 tablet by mouth 2 (two) times daily.     . cetirizine (ZYRTEC) 10 MG tablet Take 10 mg by mouth daily as needed for allergies.     . Cholecalciferol (VITAMIN Meghan Oliver-3) 5000 units TABS Take 5,000 Units by mouth daily with breakfast.     . cloNIDine (CATAPRES) 0.1 MG tablet TAKE 1 TABLET BY MOUTH DAILY AS NEEDED.( TAKE BEFORE ECT TREATMENTS) 10 tablet 1  . clotrimazole-betamethasone (LOTRISONE) cream Apply 1 application topically 2 (two) times daily. 30 g 0  . fesoterodine (TOVIAZ) 8 MG TB24 tablet Take 1 tablet (8 mg total) by mouth daily. 90 tablet 3  . irbesartan (AVAPRO) 150 MG tablet Take 150 mg by mouth daily.    Marland Kitchen Meghan Oliver-Methylfolate-B12-B6-B2 (CEREFOLIN PO) Take by mouth.    . lactulose (CHRONULAC) 10 GM/15ML solution TAKE 15 ML BY MOUTH DAILY AS NEEDED FOR  MILD CONSTIPATION 1350 mL 1  . Lavender Oil 80 MG CAPS Take 160 mg by mouth at bedtime.    Marland Kitchen levothyroxine (SYNTHROID) 75 MCG tablet TAKE 1 TABLET(75 MCG) BY MOUTH DAILY 90 tablet 0  . lithium carbonate 300 MG capsule TAKE 1 CAPSULE(300 MG) BY MOUTH AT BEDTIME 90 capsule 3  . memantine (NAMENDA) 10 MG tablet Take 1 tablet (10 mg total) by mouth 2 (two) times daily. 180 tablet 3  . Methylfol-Algae-B12-Acetylcyst (CEREFOLIN NAC) 6-90.314-2-600 MG TABS TAKE 1 CAPLET BY MOUTH THREE TIMES A DAY (Patient taking differently: 2 (two) times daily. ) 270 tablet 0  . mirtazapine (REMERON) 7.5 MG tablet Take 1 tablet (7.5 mg total) by mouth at bedtime. 90 tablet 3  . Multiple Vitamin (MULTIVITAMIN WITH MINERALS) TABS tablet Take 1 tablet by mouth daily.    . Multiple Vitamins-Minerals (MULTIVITAMIN WITH MINERALS) tablet Take 1 tablet by mouth daily.      . nortriptyline (PAMELOR) 25 MG capsule TAKE 3 CAPSULES(75 MG) BY MOUTH AT BEDTIME 270 capsule 3  . nystatin-triamcinolone (MYCOLOG II) cream Apply 1 application topically 2 (two) times daily. (Patient taking differently: Apply 1 application topically as needed. ) 30 g 0  . Omega-3 Fatty Acids (OMEGA 3 PO) Take 1 capsule by mouth at bedtime.    Marland Kitchen omeprazole (PRILOSEC) 40 MG capsule TAKE 1 CAPSULE BY MOUTH BEFORE DINNER 30 capsule 6  . OVER THE COUNTER MEDICATION Take 1 tablet by mouth 2 (two) times daily. *Cocavia*    . PARoxetine (PAXIL) 20 MG tablet TAKE 1 TABLET(20 MG) BY MOUTH AT BEDTIME 90 tablet 3  . polyethylene glycol (MIRALAX / GLYCOLAX) 17 g packet Take 17 g by mouth as needed.    . primidone (MYSOLINE) 50 MG tablet TAKE 2 TABLETS EVERY MORNING AND 1 TABLET EVERY EVENING 270 tablet 3  . Pyridoxine HCl (VITAMIN B-6) 500 MG tablet Take 500 mg by mouth 2 (two) times daily.    . rosuvastatin (CRESTOR) 40 MG tablet Take 1 tablet (40 mg total) by mouth daily. MUST BE SEEN IN OFFICE FOR FURTHER REFILLS 90 tablet 0  .  vitamin B-12 (CYANOCOBALAMIN) 1000 MCG  tablet Take 1,000 mcg by mouth at bedtime.    Marland Kitchen LORazepam (ATIVAN) 0.5 MG tablet TAKE 1/2 TO 1 TABLET BY MOUTH EVERY NIGHT AT BEDTIME AND 1 TABLET EVERY 8 HOURS AS NEEDED FOR ANXIETY 180 tablet 0   No current facility-administered medications for this visit.    Medication Side Effects: Other: tremor stable.  No worse.  Allergies:  Allergies  Allergen Reactions  . Propranolol Other (See Comments)    Low blood pressure  . Brexpiprazole Other (See Comments)    Aphasia and catatonia    Past Medical History:  Diagnosis Date  . Anxiety   . Arthritis    "hips, spine" (03/17/2018)  . Bipolar II disorder ([place])   . Chronic bronchitis ([place])   . Chronic lower back pain   . Chronic right hip pain   . CKD (chronic kidney disease), stage II   . Esophagitis, erosive   . GAD (generalized anxiety disorder)   . GERD (gastroesophageal reflux disease)   . Headache    "maybe monthly" (03/17/2018))  . Heart murmur, systolic   . History of adenomatous polyp of colon    08-04-2016  tubular adenoma  . History of blood transfusion 12/2017   "related to vascular hematoma"  . History of electroconvulsive therapy    at [place]--  started 04-15-2015 to 11-17-2016  total greater than 40 times  . History of hiatal hernia   . Hyperlipidemia   . Hypertension   . Hypothyroidism   . Internal carotid artery stenosis, bilateral    per last duplex 05-01-2014  bilateral ICA 40-59%  . Major depression, chronic    ECT treatments extensive and multiple started 07/ 2016  . Memory loss    "both short and long-term; needs frequent reminders to follow instrucitons" (05/16/2017)  . Migraines    "none in years" (03/17/2018)  . OSA (obstructive sleep apnea)    per study 06/ 2012 moderate OSA  ; "refuses to wear masks" (03/17/2018)  . Osteoporosis   . Pulmonary nodule    monitored by pcp  . S/P placement of cardiac pacemaker 03/17/18 [facility]  03/18/2018    Family History  Problem Relation Age of Onset  . Heart attack  Father 63       deceased  . Hypertension Father   . Heart disease Father   . Breast cancer Paternal Grandmother        Age unknown  . Breast cancer Paternal Aunt        Age 29's  . Colon cancer Neg Hx     Social History   Socioeconomic History  . Marital status: Married    Spouse name: Dr. Lyla Son  . Number of children: 2  . Years of education: Not on file  . Highest education level: Not on file  Occupational History  . Occupation: housewife    Employer: UNEMPLOYED  Tobacco Use  . Smoking status: Former Smoker    Packs/day: 2.00    Years: 15.00    Pack years: 30.00    Types: Cigarettes    Quit date: 01/13/1971    Years since quitting: 49.5  . Smokeless tobacco: Never Used  Vaping Use  . Vaping Use: Never used  Substance and Sexual Activity  . Alcohol use: Yes    Alcohol/week: 0.0 standard drinks    Comment: occassional  . Drug use: Never  . Sexual activity: Not Currently    Comment: intercourse age 9, sexual partners less  than 5  Other Topics Concern  . Not on file  Social History Narrative  . Not on file   Social Determinants of Health   Financial Resource Strain:   . Difficulty of Paying Living Expenses: Not on file  Food Insecurity:   . Worried About Charity fundraiser in the Last Year: Not on file  . Ran Out of Food in the Last Year: Not on file  Transportation Needs:   . Lack of Transportation (Medical): Not on file  . Lack of Transportation (Non-Medical): Not on file  Physical Activity:   . Days of Exercise per Week: Not on file  . Minutes of Exercise per Session: Not on file  Stress:   . Feeling of Stress : Not on file  Social Connections:   . Frequency of Communication with Friends and Family: Not on file  . Frequency of Social Gatherings with Friends and Family: Not on file  . Attends Religious Services: Not on file  . Active Member of Clubs or Organizations: Not on file  . Attends Archivist Meetings: Not on file  . Marital  Status: Not on file  Intimate Partner Violence:   . Fear of Current or Ex-Partner: Not on file  . Emotionally Abused: Not on file  . Physically Abused: Not on file  . Sexually Abused: Not on file    Past Medical History, Surgical history, Social history, and Family history were reviewed and updated as appropriate.   Please see review of systems for further details on the patient's review from today.   Objective:   Physical Exam:  LMP  (LMP Unknown)   Physical Exam Constitutional:      General: She is not in acute distress.    Appearance: She is well-developed.  Musculoskeletal:        General: No deformity.  Neurological:     Mental Status: She is alert and oriented to person, place, and time.     Motor: No tremor.     Coordination: Coordination normal.  Psychiatric:        Attention and Perception: She is attentive.        Mood and Affect: Mood is anxious. Mood is not depressed. Affect is not labile, blunt, angry, tearful or inappropriate.        Speech: Speech is not slurred.        Behavior: Behavior normal. Behavior is not slowed or withdrawn.        Thought Content: Thought content normal. Thought content does not include homicidal or suicidal ideation. Thought content does not include homicidal or suicidal plan.        Cognition and Memory: She exhibits impaired recent memory.        Judgment: Judgment normal.     Comments: Insight intact. No auditory or visual hallucinations.  meticulous appearance bright affect     Lab Review:     Component Value Date/Time   NA 138 07/10/2020 1021   NA 136 04/20/2017 1151   K 4.5 07/10/2020 1021   CL 104 07/10/2020 1021   CO2 24 07/10/2020 1021   GLUCOSE 123 (H) 07/10/2020 1021   BUN 25 07/10/2020 1021   BUN 24 04/20/2017 1151   CREATININE 1.05 (H) 07/10/2020 1021   CALCIUM 9.6 07/10/2020 1021   PROT 6.3 07/10/2020 1021   PROT 6.2 04/20/2017 1151   ALBUMIN 3.9 08/08/2017 1615   ALBUMIN 3.9 04/20/2017 1151   AST 40 (H)  07/10/2020 1021   ALT  48 (H) 07/10/2020 1021   ALKPHOS 46 08/08/2017 1615   BILITOT 0.3 07/10/2020 1021   BILITOT <0.2 04/20/2017 1151   GFRNONAA 50 (Meghan Oliver) 07/10/2020 1021   GFRAA 58 (Meghan Oliver) 07/10/2020 1021       Component Value Date/Time   WBC 6.3 05/15/2019 1215   RBC 4.15 05/15/2019 1215   HGB 13.7 05/15/2019 1215   HGB 10.2 (Meghan Oliver) 04/20/2017 1151   HCT 40.8 05/15/2019 1215   HCT 29.6 (Meghan Oliver) 04/20/2017 1151   PLT 243 05/15/2019 1215   PLT 385 (H) 04/20/2017 1151   MCV 98.3 05/15/2019 1215   MCV 89 04/20/2017 1151   MCH 33.0 05/15/2019 1215   MCHC 33.6 05/15/2019 1215   RDW 12.0 05/15/2019 1215   RDW 13.9 04/20/2017 1151   LYMPHSABS 1,764 05/15/2019 1215   LYMPHSABS 1.5 04/20/2017 1151   MONOABS 0.5 03/09/2017 1232   EOSABS 82 05/15/2019 1215   EOSABS 0.3 04/20/2017 1151   BASOSABS 38 05/15/2019 1215   BASOSABS 0.0 04/20/2017 1151    Lithium Lvl  Date Value Ref Range Status  07/10/2020 0.6 0.6 - 1.2 mmol/Meghan Oliver Final  Nortriptyline level 51 on 50 mg nightly with paroxetine 20 mg daily  07/10/2020 lithium level 0.6 at [facility] office and nortriptyline level 130 on the current dosages of lithium 300 mg nightly and nortriptyline 75 mg nightly  No results found for: PHENYTOIN, PHENOBARB, VALPROATE, CBMZ   .res Assessment: Plan:    Bipolar II disorder ([place])  Mild cognitive impairment  Lithium-induced tremor  Lithium use    History of lithium toxicity repeatedly.  Greater than 50% of 50 min face to face time with patient was spent on counseling and coordination of care.  Her severe treatment resistant bipolar depression is currently in remission.  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  During the severe depression she has catatonia.  She had required longterm ECT.  The last episode of depression approximately 10 years ago stayed in remission on Paxil and nortriptyline and lithium for a period of years until the lithium level was  decreased due to tremor.  This time we are maintaining an adequate lithium level.  .  Depression and anxiety much improved.     She seems to be tolerating the meds well other than the lithium tremor which is being fairly  managed with primidone.  She is continuing under the care of her neurologist Dr. Carles Collet.  As noted Dr. Exie Parody and I discussed her EC T and agreed that she could discontinue ECT.  From records it appears her last ECT was in September 2020.  Her depression has remained in remission since that time.  The nortriptyline was increased to 75 to try to reduce tendency to depression in February 2020.  Levels are noted.  Lithium 0.7 on  01/30/20 on lithium 300 mg plus 150 mg on M, W, F After reducing lithium to 300 mg nightly the level was 0.6 as noted above.  Lithium level goals are between 0.5 and 0.7.  She has a history of lithium toxicity when she was taking chlorthalidone with the lithium.   Discussed signs and symptoms of lithium toxicity.  Discussed the risk of increasing depression if the lithium level gets too low.  Lithium tremor is overall little better with the reduction in lithium.  There remains some concern about drug interactions between paroxetine and nortriptyline and given her age we do not want the nortriptyline levels higher than necessary .  However the nortriptyline level was not high.  Discussed the difficulties they have with travel.  She can get confused with new environments and excessive stimulation.  She does better with routine. Overall memory appears to be stable and she has reasonable recall for recent family events.  Repeat lithium level and labs after reduction in lithium and nortriptyline level to monitor for SE.  Continue the reduction in lithium to 300 mg daily are indicated today  This appointment was 50 minutes.   Follow-up 3 months  Lynder Parents MD, DFAPA Please see After Visit Summary for patient specific instructions.  Future Appointments   Date Time Provider [place]  07/25/2020  3:15 PM Evans Lance, MD [facility]  08/27/2020  8:40 AM [facility] DEVICE REMOTES [facility]  10/13/2020  1:00 PM Cottle, Billey Co., MD [facility] None    No orders of the defined types were placed in this encounter.     -------------------------------

## 2020-07-14 NOTE — Telephone Encounter (Signed)
Please send

## 2020-07-16 ENCOUNTER — Encounter: Payer: Self-pay | Admitting: Psychiatry

## 2020-07-21 DIAGNOSIS — H25813 Combined forms of age-related cataract, bilateral: Secondary | ICD-10-CM | POA: Diagnosis not present

## 2020-07-21 DIAGNOSIS — H353112 Nonexudative age-related macular degeneration, right eye, intermediate dry stage: Secondary | ICD-10-CM | POA: Diagnosis not present

## 2020-07-25 ENCOUNTER — Encounter: Payer: Medicare Other | Admitting: Internal Medicine

## 2020-08-13 ENCOUNTER — Telehealth: Payer: Self-pay | Admitting: Internal Medicine

## 2020-08-13 NOTE — Telephone Encounter (Signed)
LVM for [name] to [facility] and schedule a phone visit for Auriana's AMW on a Wednesday morning sometime between now and the end of the year.

## 2020-08-19 ENCOUNTER — Encounter: Payer: Self-pay | Admitting: Internal Medicine

## 2020-08-19 ENCOUNTER — Ambulatory Visit (INDEPENDENT_AMBULATORY_CARE_PROVIDER_SITE_OTHER): Payer: Medicare Other | Admitting: Internal Medicine

## 2020-08-19 ENCOUNTER — Other Ambulatory Visit: Payer: Self-pay

## 2020-08-19 VITALS — BP 144/68 | HR 60 | Ht <= 58 in | Wt 101.0 lb

## 2020-08-19 DIAGNOSIS — I495 Sick sinus syndrome: Secondary | ICD-10-CM

## 2020-08-19 DIAGNOSIS — I2510 Atherosclerotic heart disease of native coronary artery without angina pectoris: Secondary | ICD-10-CM | POA: Diagnosis not present

## 2020-08-19 NOTE — Patient Instructions (Signed)
Medication Instructions:  Your physician recommends that you continue on your current medications as directed. Please refer to the Current Medication list given to you today.  *If you need a refill on your cardiac medications before your next appointment, please call your pharmacy*   Lab Work: None ordered.  If you have labs (blood work) drawn today and your tests are completely normal, you will receive your results only by: MyChart Message (if you have MyChart) OR A paper copy in the mail If you have any lab test that is abnormal or we need to change your treatment, we will call you to review the results.   Testing/Procedures: None ordered.    Follow-Up: At [facility], you and your health needs are our priority.  As part of our continuing mission to provide you with exceptional heart care, we have created designated Provider Care Teams.  These Care Teams include your primary Cardiologist (physician) and Advanced Practice Providers (APPs -  Physician Assistants and Nurse Practitioners) who all work together to provide you with the care you need, when you need it.  We recommend signing up for the patient portal called "MyChart".  Sign up information is provided on this After Visit Summary.  MyChart is used to connect with patients for Virtual Visits (Telemedicine).  Patients are able to view lab/test results, encounter notes, upcoming appointments, etc.  Non-urgent messages can be sent to your provider as well.   To learn more about what you can do with MyChart, go to [email or web address].    Your next appointment:   12 month(s)  The format for your next appointment:   In Person  Provider:   Gregg Taylor, MD    

## 2020-08-19 NOTE — Progress Notes (Signed)
HPI Mrs. Pautler returns today for followup. She is a pleasant 79 yo woman with a h/o sinus node dysfunction who developed prolonged pauses and underwent PPM insertion about 2.5 years ago. In the interim, she has improved. Her energy level is good. She denies syncope. Allergies  Allergen Reactions   Propranolol Other (See Comments)    Low blood pressure   Brexpiprazole Other (See Comments)    Aphasia and catatonia     Current Outpatient Medications  Medication Sig Dispense Refill   Calcium Carbonate-Vitamin D (CALCIUM 600/VITAMIN D PO) Take 1 tablet by mouth 2 (two) times daily.      cetirizine (ZYRTEC) 10 MG tablet Take 10 mg by mouth daily as needed for allergies.      Cholecalciferol (VITAMIN D-3) 5000 units TABS Take 5,000 Units by mouth daily with breakfast.      cloNIDine (CATAPRES) 0.1 MG tablet TAKE 1 TABLET BY MOUTH DAILY AS NEEDED.( TAKE BEFORE ECT TREATMENTS) 10 tablet 1   clotrimazole-betamethasone (LOTRISONE) cream Apply 1 application topically 2 (two) times daily. 30 g 0   fesoterodine (TOVIAZ) 8 MG TB24 tablet Take 1 tablet (8 mg total) by mouth daily. 90 tablet 3   irbesartan (AVAPRO) 150 MG tablet Take 150 mg by mouth daily.     L-Methylfolate-B12-B6-B2 (CEREFOLIN PO) Take by mouth.     lactulose (CHRONULAC) 10 GM/15ML solution TAKE 15 ML BY MOUTH DAILY AS NEEDED FOR MILD CONSTIPATION 1350 mL 1   Lavender Oil 80 MG CAPS Take 160 mg by mouth at bedtime.     levothyroxine (SYNTHROID) 75 MCG tablet TAKE 1 TABLET(75 MCG) BY MOUTH DAILY 90 tablet 0   lithium carbonate 300 MG capsule TAKE 1 CAPSULE(300 MG) BY MOUTH AT BEDTIME 90 capsule 3   LORazepam (ATIVAN) 0.5 MG tablet TAKE 1/2 TO 1 TABLET BY MOUTH EVERY NIGHT AT BEDTIME AND 1 TABLET EVERY 8 HOURS AS NEEDED FOR ANXIETY 180 tablet 0   memantine (NAMENDA) 10 MG tablet Take 1 tablet (10 mg total) by mouth 2 (two) times daily. 180 tablet 3   Methylfol-Algae-B12-Acetylcyst (CEREFOLIN NAC)  6-90.314-2-600 MG TABS TAKE 1 CAPLET BY MOUTH THREE TIMES A DAY (Patient taking differently: 2 (two) times daily. ) 270 tablet 0   mirtazapine (REMERON) 7.5 MG tablet Take 1 tablet (7.5 mg total) by mouth at bedtime. 90 tablet 3   Multiple Vitamin (MULTIVITAMIN WITH MINERALS) TABS tablet Take 1 tablet by mouth daily.     Multiple Vitamins-Minerals (MULTIVITAMIN WITH MINERALS) tablet Take 1 tablet by mouth daily.       nortriptyline (PAMELOR) 25 MG capsule TAKE 3 CAPSULES(75 MG) BY MOUTH AT BEDTIME 270 capsule 3   nystatin-triamcinolone (MYCOLOG II) cream Apply 1 application topically 2 (two) times daily. (Patient taking differently: Apply 1 application topically as needed. ) 30 g 0   Omega-3 Fatty Acids (OMEGA 3 PO) Take 1 capsule by mouth at bedtime.     omeprazole (PRILOSEC) 40 MG capsule TAKE 1 CAPSULE BY MOUTH BEFORE DINNER 30 capsule 6   OVER THE COUNTER MEDICATION Take 1 tablet by mouth 2 (two) times daily. *Cocavia*     PARoxetine (PAXIL) 20 MG tablet TAKE 1 TABLET(20 MG) BY MOUTH AT BEDTIME 90 tablet 3   polyethylene glycol (MIRALAX / GLYCOLAX) 17 g packet Take 17 g by mouth as needed.     primidone (MYSOLINE) 50 MG tablet TAKE 2 TABLETS EVERY MORNING AND 1 TABLET EVERY EVENING 270 tablet 3   Pyridoxine HCl (  VITAMIN B-6) 500 MG tablet Take 500 mg by mouth 2 (two) times daily.     rosuvastatin (CRESTOR) 40 MG tablet Take 1 tablet (40 mg total) by mouth daily. MUST BE SEEN IN OFFICE FOR FURTHER REFILLS 90 tablet 0   valACYclovir (VALTREX) 1000 MG tablet Take 1 tablet by mouth as needed. Use as directed     vitamin B-12 (CYANOCOBALAMIN) 1000 MCG tablet Take 1,000 mcg by mouth at bedtime.     No current facility-administered medications for this visit.     Past Medical History:  Diagnosis Date   Anxiety    Arthritis    "hips, spine" (03/17/2018)   Bipolar II disorder (HCC)    Chronic bronchitis (HCC)    Chronic lower back pain    Chronic right hip pain    CKD  (chronic kidney disease), stage II    Esophagitis, erosive    GAD (generalized anxiety disorder)    GERD (gastroesophageal reflux disease)    Headache    "maybe monthly" (03/17/2018))   Heart murmur, systolic    History of adenomatous polyp of colon    08-04-2016  tubular adenoma   History of blood transfusion 12/2017   "related to vascular hematoma"   History of electroconvulsive therapy    at [place]--  started 04-15-2015 to 11-17-2016  total greater than 40 times   History of hiatal hernia    Hyperlipidemia    Hypertension    Hypothyroidism    Internal carotid artery stenosis, bilateral    per last duplex 05-01-2014  bilateral ICA 40-59%   Major depression, chronic    ECT treatments extensive and multiple started 07/ 2016   Memory loss    "both short and long-term; needs frequent reminders to follow instrucitons" (05/16/2017)   Migraines    "none in years" (03/17/2018)   OSA (obstructive sleep apnea)    per study 06/ 2012 moderate OSA  ; "refuses to wear masks" (03/17/2018)   Osteoporosis    Pulmonary nodule    monitored by pcp   S/P placement of cardiac pacemaker 03/17/18 [employer] Jude  03/18/2018    ROS:   All systems reviewed and negative except as noted in the HPI.   Past Surgical History:  Procedure Laterality Date   APPENDECTOMY  1971   AUGMENTATION MAMMAPLASTY Bilateral    CARDIOVASCULAR STRESS TEST  01-30-2015  dr Aundra Dubin   Low risk nuclear study w/ no evidence ischemia or infarction/  normal LV funciton and wall motion , 76%   COLONOSCOPY W/ BIOPSIES AND POLYPECTOMY  "multiple"   COLONOSCOPY WITH ESOPHAGOGASTRODUODENOSCOPY (EGD)  last one 08-04-2016   CORONARY ANGIOPLASTY WITH STENT PLACEMENT  05/16/2017   "LAD"   CORONARY STENT INTERVENTION N/A 05/16/2017   Procedure: CORONARY STENT INTERVENTION;  Surgeon: Burnell Blanks, MD;  Location: [facility];  Service: Cardiovascular;  Laterality: N/A;   ESOPHAGOGASTRODUODENOSCOPY   02-26-04   ESOPHAGOGASTRODUODENOSCOPY  "multiple"   INSERT / REPLACE / REMOVE PACEMAKER  03/17/2018   LEFT HEART CATH AND CORONARY ANGIOGRAPHY N/A 05/16/2017   Procedure: LEFT HEART CATH AND CORONARY ANGIOGRAPHY;  Surgeon: Larey Dresser, MD;  Location: [facility];  Service: Cardiovascular;  Laterality: N/A;   OVARIAN CYST SURGERY  1970s   Laparotomy    PACEMAKER IMPLANT N/A 03/17/2018   Procedure: PACEMAKER IMPLANT;  Surgeon: Evans Lance, MD;  Location: [place] [facility] [facility];  Service: Cardiovascular;  Laterality: N/A;   PORT-A-CATH PLACEMENT  05/31/2016; 2018   "@ [facility];  for ECT series"; "@ [facility] also"   PORT-A-CATH REMOVAL N/A 03/11/2017   Procedure: REMOVAL PORT-A-CATH;  Surgeon: Jackolyn Confer, MD;  [facility];  Service: General;  Laterality: N/A;   PORTA CATH REMOVAL  03/17/2018   PORTA CATH REMOVAL  03/17/2018   Procedure: PORTA CATH REMOVAL;  Surgeon: Evans Lance, MD;  Location: [place] [facility];  Service: Cardiovascular;;   TRANSTHORACIC ECHOCARDIOGRAM  09/05/2013  dr Aundra Dubin   mild LVH, ef 29-52%, grade 1 diastolic dysfunction/  very mild AV stenosis with mild AR/  trivial MR and PT/ mild to moderate LAE/ mild TR/ mild pulmonary hypertension with PA peak pressure 79mmHg   TUBAL LIGATION       Family History  Problem Relation Age of Onset   Heart attack Father 57       deceased   Hypertension Father    Heart disease Father    Breast cancer Paternal Grandmother        Age unknown   Breast cancer Paternal Aunt        Age 50's   Colon cancer Neg Hx      Social History   Socioeconomic History   Marital status: Married    Spouse name: Dr. Lyla Son   Number of children: 2   Years of education: Not on file   Highest education level: Not on file  Occupational History   Occupation: housewife    Employer: UNEMPLOYED  Tobacco Use   Smoking status: Former Smoker    Packs/day: 2.00    Years: 15.00    Pack  years: 30.00    Types: Cigarettes    Quit date: 01/13/1971    Years since quitting: 49.6   Smokeless tobacco: Never Used  Vaping Use   Vaping Use: Never used  Substance and Sexual Activity   Alcohol use: Yes    Alcohol/week: 0.0 standard drinks    Comment: occassional   Drug use: Never   Sexual activity: Not Currently    Comment: intercourse age 28, sexual partners less than 5  Other Topics Concern   Not on file  Social History Narrative   Not on file   Social Determinants of Health   Financial Resource Strain:    Difficulty of Paying Living Expenses: Not on file  Food Insecurity:    Worried About [place] in the Last Year: Not on file   [employer] of Food in the Last Year: Not on file  Transportation Needs:    Lack of Transportation (Medical): Not on file   Lack of Transportation (Non-Medical): Not on file  Physical Activity:    Days of Exercise per Week: Not on file   Minutes of Exercise per Session: Not on file  Stress:    Feeling of Stress : Not on file  Social Connections:    Frequency of Communication with Friends and Family: Not on file   Frequency of Social Gatherings with Friends and Family: Not on file   Attends Religious Services: Not on file   Active Member of Clubs or Organizations: Not on file   Attends Archivist Meetings: Not on file   Marital Status: Not on file  Intimate Partner Violence:    Fear of Current or Ex-Partner: Not on file   Emotionally Abused: Not on file   Physically Abused: Not on file   Sexually Abused: Not on file     BP (!) 144/68    Pulse 60  Ht 4\' 10"  (1.473 m)    Wt 101 lb (45.8 kg)    LMP  (LMP Unknown)    SpO2 96%    BMI 21.11 kg/m   Physical Exam:  Well appearing NAD HEENT: Unremarkable Neck:  No JVD, no thyromegally Lymphatics:  No adenopathy Back:  No CVA tenderness Lungs:  Clear HEART:  Regular rate rhythm, no murmurs, no rubs, no clicks Abd:  soft, positive bowel  sounds, no organomegally, no rebound, no guarding Ext:  2 plus pulses, no edema, no cyanosis, no clubbing Skin:  No rashes no nodules Neuro:  CN II through XII intact, motor grossly intact  EKG - nsr with atrial pacing  DEVICE  Normal device function.  See PaceArt for details.   Assess/Plan: 1. Sinus node dysfunction - she has no p waves today and is atrially pacing. 2. PPM -her [employer] DDD PM is working normally 3. CAD - she denies anginal symptoms. No change in meds.  4. Depression - she appears to be much improved. She notes that she and her husband are moving to [place]. Excited.   Carleene Overlie Birtie Fellman,MD

## 2020-08-20 ENCOUNTER — Encounter: Payer: Self-pay | Admitting: Internal Medicine

## 2020-08-20 ENCOUNTER — Ambulatory Visit (INDEPENDENT_AMBULATORY_CARE_PROVIDER_SITE_OTHER): Payer: Medicare Other | Admitting: Internal Medicine

## 2020-08-20 VITALS — Ht <= 58 in

## 2020-08-20 DIAGNOSIS — Z0000 Encounter for general adult medical examination without abnormal findings: Secondary | ICD-10-CM | POA: Diagnosis not present

## 2020-08-21 ENCOUNTER — Other Ambulatory Visit: Payer: Self-pay | Admitting: Psychiatry

## 2020-08-21 NOTE — Progress Notes (Signed)
Subjective:    Patient ID: Meghan Oliver, female    DOB: [birth year], 79 y.o.   MRN: [id number]  HPI 79 year old Female interviewed by phone today for Annual Medicare Wellness visit.  Patient is at home and I am in my office.  She is agreeable to visit in this format today.    She is getting her home ready to be sold so that she and her husband can move to [place]  She has a lot going on at the present time.  Is finding it a bit overwhelming.  They also have a time in [place].  They hope to travel they are after the holidays.  Husband enjoys playing golf there.  Daughter lives there as well.  She is identified using 2 identifiers as Meghan Oliver, a patient in this practice.  Patient has history of bipolar 2 disorder followed by [name], psychiatrist.  Su Grand had ECT therapy at [facility].  Patient reports that she is doing well from a psychiatric standpoint.  She has a history of pacemaker and was seen recently by Dr. Crissie Sickles, electrophysiologist.  She has a history of sinus node dysfunction and developed prolonged pauses.  Underwent permanent pacemaker insertion approximately 2.5 years ago.  She has improved and her energy level is good and has had no syncope.  She has a history of hypertension followed at [facility].  Is on irbesartan, clonidine tablet as needed cardiologist has her on Crestor 40 mg daily.  History of hypothyroidism treated with thyroid replacement medication.  This is some issues with memory and [name] has her on Namenda 10 mg twice daily.  Takes Prilosec for GE reflux   Subjective:   Patient presents for Medicare Annual/Subsequent preventive examination.  Review Past Medical/Family/Social: See previous dictation   Risk Factors  Current exercise habits: Light exercise Dietary issues discussed: Follows low-fat low-carb diet  Cardiac risk factors: Hypertension hyperlipidemia pacemaker  Depression Screen  (Note: if answer to either of the  following is "Yes", a more complete depression screening is indicated)   Over the past two weeks, have you felt down, depressed or hopeless? No just  anxious about downsizing my home Over the past two weeks, have you felt little interest or pleasure in doing things? No Have you lost interest or pleasure in daily life? No Do you often feel hopeless? No Do you cry easily over simple problems? No   Activities of Daily Living  In your present state of health, do you have any difficulty performing the following activities?:   Driving? No longer drives Managing money? No  Feeding yourself? No  Getting from bed to chair? No  Climbing a flight of stairs?  Yes has to hold to stair rail Preparing food and eating?: No  Bathing or showering? No  Getting dressed: No  Getting to the toilet? No  Using the toilet:No  Moving around from place to place: No  In the past year have you fallen or had a near fall?:  Yes twice Are you sexually active? No  Do you have more than one partner? No   Hearing Difficulties: No  Do you often ask people to speak up or repeat themselves? No  Do you experience ringing or noises in your ears? No  Do you have difficulty understanding soft or whispered voices? No  Do you feel that you have a problem with memory?  Yes Do you often misplace items?  Yes   Home Safety:  Do  you have a smoke alarm at your residence? Yes Do you have grab bars in the bathroom?  Yes Do you have throw rugs in your house?  Yes   Cognitive Testing  Alert? Yes   Recall of three objects? Not tested Can perform simple calculations? Not tested Displays appropriate judgment?Yes  Can read the correct time from a watch face?Yes   List the Names of Other Physician/Practitioners you currently use:  See referral list for the physicians patient is currently seeing.     Review of Systems:see above   Objective:     Phone visit: Pleasant and cooperative on phone Psych: Alert & Oriented x  3, Mood appear stable.    Assessment:    Annual wellness medicare exam   Plan:    During the course of the visit the patient was educated and counseled about appropriate screening and preventive services including:   Has had 3 Covid-19 vaccines and Influenza vaccine     Patient Instructions (the written plan) was given to the patient.  Medicare Attestation  I have personally reviewed:  The patient's medical and social history  Their use of alcohol, tobacco or illicit drugs  Their current medications and supplements  The patient's functional ability including ADLs,fall risks, home safety risks, cognitive, and hearing and visual impairment  Diet and physical activities  Evidence for depression or mood disorders                     Assessment & Plan:  Medical issues appear to be stable at this point in time.  She is planning to move to [place] in the near future.  After the first of the year will be going to [place] to their home and also to see their daughter.

## 2020-08-21 NOTE — Addendum Note (Signed)
Addended by: Rose Phi on: 08/21/2020 08:32 AM   Modules accepted: Orders

## 2020-08-22 ENCOUNTER — Other Ambulatory Visit: Payer: Self-pay | Admitting: Gastroenterology

## 2020-08-31 NOTE — Patient Instructions (Signed)
It was a pleasure speaking with you by phone today.  Medicare wellness visit has been completed by phone.  Best of luck with your move to [place].  We will plan to follow-up after the first of the year when you return from [place].

## 2020-09-25 DIAGNOSIS — B353 Tinea pedis: Secondary | ICD-10-CM | POA: Diagnosis not present

## 2020-09-25 DIAGNOSIS — M2040 Other hammer toe(s) (acquired), unspecified foot: Secondary | ICD-10-CM | POA: Diagnosis not present

## 2020-09-25 DIAGNOSIS — B351 Tinea unguium: Secondary | ICD-10-CM | POA: Diagnosis not present

## 2020-09-25 DIAGNOSIS — S93509A Unspecified sprain of unspecified toe(s), initial encounter: Secondary | ICD-10-CM | POA: Diagnosis not present

## 2020-09-25 DIAGNOSIS — L602 Onychogryphosis: Secondary | ICD-10-CM | POA: Diagnosis not present

## 2020-09-29 ENCOUNTER — Other Ambulatory Visit: Payer: Self-pay

## 2020-09-29 MED ORDER — TOVIAZ 8 MG PO TB24
8.0000 mg | ORAL_TABLET | Freq: Every day | ORAL | 3 refills | Status: DC
Start: 2020-09-29 — End: 2021-01-12

## 2020-10-13 ENCOUNTER — Ambulatory Visit: Payer: Medicare Other | Admitting: Psychiatry

## 2020-10-13 DIAGNOSIS — B353 Tinea pedis: Secondary | ICD-10-CM | POA: Diagnosis not present

## 2020-10-13 DIAGNOSIS — M79672 Pain in left foot: Secondary | ICD-10-CM | POA: Diagnosis not present

## 2020-10-13 DIAGNOSIS — M79671 Pain in right foot: Secondary | ICD-10-CM | POA: Diagnosis not present

## 2020-10-13 DIAGNOSIS — B351 Tinea unguium: Secondary | ICD-10-CM | POA: Diagnosis not present

## 2020-10-16 ENCOUNTER — Other Ambulatory Visit: Payer: Self-pay | Admitting: Internal Medicine

## 2020-11-04 ENCOUNTER — Ambulatory Visit (INDEPENDENT_AMBULATORY_CARE_PROVIDER_SITE_OTHER): Payer: Medicare Other | Admitting: Gastroenterology

## 2020-11-04 ENCOUNTER — Encounter: Payer: Self-pay | Admitting: Gastroenterology

## 2020-11-04 VITALS — BP 126/60 | HR 86 | Ht <= 58 in | Wt 100.2 lb

## 2020-11-04 DIAGNOSIS — K219 Gastro-esophageal reflux disease without esophagitis: Secondary | ICD-10-CM

## 2020-11-04 DIAGNOSIS — K449 Diaphragmatic hernia without obstruction or gangrene: Secondary | ICD-10-CM

## 2020-11-04 DIAGNOSIS — R1310 Dysphagia, unspecified: Secondary | ICD-10-CM

## 2020-11-04 MED ORDER — SUCRALFATE 1 GM/10ML PO SUSP: 1.0000 g | Freq: Three times a day (TID) | ORAL | 1 refills | Status: DC

## 2020-11-04 MED ORDER — DEXLANSOPRAZOLE 60 MG PO CPDR: 60.0000 mg | DELAYED_RELEASE_CAPSULE | Freq: Every day | ORAL | 3 refills | Status: DC

## 2020-11-04 NOTE — Progress Notes (Signed)
Meghan Oliver    YD:[id number]    1940/10/11  Primary Care Physician:Baxley, Cresenciano Lick, MD  Referring Physician: [name], MD [address] [place],  [place] [address]   Chief complaint:  GERD  HPI:  Meghan Oliver is a very pleasant 80 yr old female here for follow up visit for worsening GERD symptoms.  She is currently taking Omeprazole 40mg  twice daily with persistent reflux symptoms, heartburn , regurgitation and also having intermittent dysphagia. She is accompanied by Dr Lyla Son  EGD 10/05/2017 in [place]: White flecks in esophagus, biopsies to r/o candida Mild antral gastritis, medium sized hernia otherwise normal. Normal duodenum.  CT abdomen & pelvis 08/09/2017:  Mild diverticulitis of distal sigmoid colon. Large stool burden.  EGD 08/04/2016: Erosive esophagitis with esophageal ulcers, hiatal hernia, gastropathy   Outpatient Encounter Medications as of 11/04/2020  Medication Sig  . ASPIRIN 81 PO Take by mouth.  . Calcium Carbonate-Vitamin D (CALCIUM 600/VITAMIN D PO) Take 1 tablet by mouth 2 (two) times daily.  . cetirizine (ZYRTEC) 10 MG tablet Take 10 mg by mouth daily as needed for allergies.   . Cholecalciferol (VITAMIN D-3) 5000 units TABS Take 5,000 Units by mouth daily with breakfast.   . cloNIDine (CATAPRES) 0.1 MG tablet TAKE 1 TABLET BY MOUTH DAILY AS NEEDED.( TAKE BEFORE ECT TREATMENTS)  . clotrimazole-betamethasone (LOTRISONE) cream Apply 1 application topically 2 (two) times daily.  . fesoterodine (TOVIAZ) 8 MG TB24 tablet Take 1 tablet (8 mg total) by mouth daily.  . Multiple Vitamin (MULTIVITAMIN WITH MINERALS) TABS tablet Take 1 tablet by mouth daily.  . Multiple Vitamins-Minerals (MULTIVITAMIN WITH MINERALS) tablet Take 1 tablet by mouth daily.  . vitamin B-12 (CYANOCOBALAMIN) 1000 MCG tablet Take 1,000 mcg by mouth at bedtime.  . irbesartan (AVAPRO) 150 MG tablet Take 150 mg by mouth daily.  Marland Kitchen L-Methylfolate-B12-B6-B2 (CEREFOLIN PO)  Take by mouth. (Patient not taking: Reported on 08/20/2020)  . lactulose (CHRONULAC) 10 GM/15ML solution TAKE 15 ML BY MOUTH DAILY AS NEEDED FOR MILD CONSTIPATION  . Lavender Oil 80 MG CAPS Take 160 mg by mouth at bedtime.  Marland Kitchen levothyroxine (SYNTHROID) 75 MCG tablet TAKE 1 TABLET(75 MCG) BY MOUTH DAILY  . lithium carbonate 300 MG capsule TAKE 1 CAPSULE(300 MG) BY MOUTH AT BEDTIME  . LORazepam (ATIVAN) 0.5 MG tablet TAKE 1/2 TO 1 TABLET BY MOUTH EVERY NIGHT AT BEDTIME AND 1 TABLET EVERY 8 HOURS AS NEEDED FOR ANXIETY  . memantine (NAMENDA) 10 MG tablet Take 1 tablet (10 mg total) by mouth 2 (two) times daily.  . Methylfol-Algae-B12-Acetylcyst (CEREFOLIN NAC) 6-90.314-2-600 MG TABS TAKE 1 CAPLET BY MOUTH THREE TIMES A DAY (Patient taking differently: 2 (two) times daily. )  . mirtazapine (REMERON) 7.5 MG tablet TAKE 1 TABLET(7.5 MG) BY MOUTH AT BEDTIME  . nortriptyline (PAMELOR) 25 MG capsule TAKE 3 CAPSULES(75 MG) BY MOUTH AT BEDTIME  . nystatin-triamcinolone (MYCOLOG II) cream Apply 1 application topically 2 (two) times daily. (Patient taking differently: Apply 1 application topically as needed. )  . Omega-3 Fatty Acids (OMEGA 3 PO) Take 1 capsule by mouth at bedtime.  Marland Kitchen omeprazole (PRILOSEC) 40 MG capsule TAKE 1 CAPSULE BY MOUTH BEFORE DINNER  . OVER THE COUNTER MEDICATION Take 1 tablet by mouth 2 (two) times daily. *Cocavia*  . PARoxetine (PAXIL) 20 MG tablet TAKE 1 TABLET(20 MG) BY MOUTH AT BEDTIME  . polyethylene glycol (MIRALAX / GLYCOLAX) 17 g packet Take 17 g  by mouth as needed.  . primidone (MYSOLINE) 50 MG tablet TAKE 2 TABLETS EVERY MORNING AND 1 TABLET EVERY EVENING  . Pyridoxine HCl (VITAMIN B-6) 500 MG tablet Take 500 mg by mouth 2 (two) times daily.  . rosuvastatin (CRESTOR) 40 MG tablet Take 1 tablet (40 mg total) by mouth daily. MUST BE SEEN IN OFFICE FOR FURTHER REFILLS  . valACYclovir (VALTREX) 1000 MG tablet Take 1 tablet by mouth as needed. Use as directed   No  facility-administered encounter medications on file as of 11/04/2020.    Allergies as of 11/04/2020 - Review Complete 11/04/2020  Allergen Reaction Noted  . Propranolol Other (See Comments) 08/06/2013  . Brexpiprazole Other (See Comments) 11/27/2015    Past Medical History:  Diagnosis Date  . Anxiety   . Arthritis    "hips, spine" (03/17/2018)  . Bipolar II disorder ([place])   . Chronic bronchitis ([place])   . Chronic lower back pain   . Chronic right hip pain   . CKD (chronic kidney disease), stage II   . Esophagitis, erosive   . GAD (generalized anxiety disorder)   . GERD (gastroesophageal reflux disease)   . Headache    "maybe monthly" (03/17/2018))  . Heart murmur, systolic   . History of adenomatous polyp of colon    08-04-2016  tubular adenoma  . History of blood transfusion 12/2017   "related to vascular hematoma"  . History of electroconvulsive therapy    at [facility]--  started 04-15-2015 to 11-17-2016  total greater than 40 times  . History of hiatal hernia   . Hyperlipidemia   . Hypertension   . Hypothyroidism   . Internal carotid artery stenosis, bilateral    per last duplex 05-01-2014  bilateral ICA 40-59%  . Major depression, chronic    ECT treatments extensive and multiple started 07/ 2016  . Memory loss    "both short and long-term; needs frequent reminders to follow instrucitons" (05/16/2017)  . Migraines    "none in years" (03/17/2018)  . OSA (obstructive sleep apnea)    per study 06/ 2012 moderate OSA  ; "refuses to wear masks" (03/17/2018)  . Osteoporosis   . Pulmonary nodule    monitored by pcp  . S/P placement of cardiac pacemaker 03/17/18 [facility]  03/18/2018    Past Surgical History:  Procedure Laterality Date  . APPENDECTOMY  1971  . AUGMENTATION MAMMAPLASTY Bilateral   . CARDIOVASCULAR STRESS TEST  01-30-2015  dr Aundra Dubin   Low risk nuclear study w/ no evidence ischemia or infarction/  normal LV funciton and wall motion , 76%  . COLONOSCOPY W/ BIOPSIES AND  POLYPECTOMY  "multiple"  . COLONOSCOPY WITH ESOPHAGOGASTRODUODENOSCOPY (EGD)  last one 08-04-2016  . CORONARY ANGIOPLASTY WITH STENT PLACEMENT  05/16/2017   "LAD"  . CORONARY STENT INTERVENTION N/A 05/16/2017   Procedure: CORONARY STENT INTERVENTION;  Surgeon: Burnell Blanks, MD;  Location: [facility];  Service: Cardiovascular;  Laterality: N/A;  . ESOPHAGOGASTRODUODENOSCOPY  02-26-04  . ESOPHAGOGASTRODUODENOSCOPY  "multiple"  . INSERT / REPLACE / REMOVE PACEMAKER  03/17/2018  . LEFT HEART CATH AND CORONARY ANGIOGRAPHY N/A 05/16/2017   Procedure: LEFT HEART CATH AND CORONARY ANGIOGRAPHY;  Surgeon: Larey Dresser, MD;  Location: [facility];  Service: Cardiovascular;  Laterality: N/A;  . OVARIAN CYST SURGERY  1970s   Laparotomy   . PACEMAKER IMPLANT N/A 03/17/2018   Procedure: PACEMAKER IMPLANT;  Surgeon: Evans Lance, MD;  Location: [facility];  Service:  Cardiovascular;  Laterality: N/A;  . PORT-A-CATH PLACEMENT  05/31/2016; 2018   "@ [facility]; for ECT series"; "@ [facility] also"  . PORT-A-CATH REMOVAL N/A 03/11/2017   Procedure: REMOVAL PORT-A-CATH;  Surgeon: Jackolyn Confer, MD;  Location[facility];  Service: General;  Laterality: N/A;  . PORTA CATH REMOVAL  03/17/2018  . PORTA CATH REMOVAL  03/17/2018   Procedure: PORTA CATH REMOVAL;  Surgeon: Evans Lance, MD;  Location: [facility];  Service: Cardiovascular;;  . TRANSTHORACIC ECHOCARDIOGRAM  09/05/2013  dr Aundra Dubin   mild LVH, ef 67-61%, grade 1 diastolic dysfunction/  very mild AV stenosis with mild AR/  trivial MR and PT/ mild to moderate LAE/ mild TR/ mild pulmonary hypertension with PA peak pressure 68mmHg  . TUBAL LIGATION      Family History  Problem Relation Age of Onset  . Heart attack Father 102       deceased  . Hypertension Father   . Heart disease Father   . Breast cancer Paternal Grandmother        Age unknown  . Breast cancer Paternal Aunt        Age 25's  . Colon  cancer Neg Hx     Social History   Socioeconomic History  . Marital status: Married    Spouse name: Dr. Lyla Son  . Number of children: 2  . Years of education: Not on file  . Highest education level: Not on file  Occupational History  . Occupation: housewife    Employer: UNEMPLOYED  Tobacco Use  . Smoking status: Former Smoker    Packs/day: 2.00    Years: 15.00    Pack years: 30.00    Types: Cigarettes    Quit date: 01/13/1971    Years since quitting: 49.8  . Smokeless tobacco: Never Used  Vaping Use  . Vaping Use: Never used  Substance and Sexual Activity  . Alcohol use: Yes    Alcohol/week: 0.0 standard drinks    Comment: occassional  . Drug use: Never  . Sexual activity: Not Currently    Comment: intercourse age 65, sexual partners less than 5  Other Topics Concern  . Not on file  Social History Narrative  . Not on file   Social Determinants of Health   Financial Resource Strain: Not on file  Food Insecurity: Not on file  Transportation Needs: Not on file  Physical Activity: Not on file  Stress: Not on file  Social Connections: Not on file  Intimate Partner Violence: Not on file      Review of systems: All other review of systems negative except as mentioned in the HPI.   Physical Exam: Vitals:   11/04/20 0833  BP: 126/60  Pulse: 86   Body mass index is 20.94 kg/m. Gen:      No acute distress Neuro: alert and oriented x 3 Psych: normal mood and affect  Data Reviewed:  Reviewed labs, radiology imaging, old records and pertinent past GI work up   Assessment and Plan/Recommendations:  80 yr old F with h/o hiatal hernia, erosive esophagitis, candida with c/o worsening reflux symptoms, odynophagia and dysphagia despite high dose PPI  Will plan to proceed with EGD with esophageal biopsies, and dilation if needed. Will need to exclude candida esophagitis, recurrent esophageal ulcers or neoplasia (less likely given recent evaluation in less  than 3 years) Switch to Dexilant 60mg  daily Anti reflux measures Carafate suspension before meals and at bedtime as needed  Further recommendations  based on EGD findings   The patient was provided an opportunity to ask questions and all were answered. The patient agreed with the plan and demonstrated an understanding of the instructions.  Damaris Hippo , MD    CC: [name], MD

## 2020-11-04 NOTE — Patient Instructions (Signed)
You have been scheduled for an endoscopy. Please follow written instructions given to you at your visit today. If you use inhalers (even only as needed), please bring them with you on the day of your procedure.   Due to recent changes in healthcare laws, you may see the results of your imaging and laboratory studies on MyChart before your provider has had a chance to review them.  We understand that in some cases there may be results that are confusing or concerning to you. Not all laboratory results come back in the same time frame and the provider may be waiting for multiple results in order to interpret others.  Please give [name] 48 hours in order for your provider to thoroughly review all the results before contacting the office for clarification of your results.   We have sent Dexilant and carafate  to your pharmacy  STOP Omeprazole   Conn's Current Therapy 2021 (pp. 213-216). [place], [place]: Elsevier.">  Gastroesophageal Reflux Disease, Adult Gastroesophageal reflux (GER) happens when acid from the stomach flows up into the tube that connects the mouth and the stomach (esophagus). Normally, food travels down the esophagus and stays in the stomach to be digested. However, when a person has GER, food and stomach acid sometimes move back up into the esophagus. If this becomes a more serious problem, the person may be diagnosed with a disease called gastroesophageal reflux disease (GERD). GERD occurs when the reflux:  Happens often.  Causes frequent or severe symptoms.  Causes problems such as damage to the esophagus. When stomach acid comes in contact with the esophagus, the acid may cause inflammation in the esophagus. Over time, GERD may create small holes (ulcers) in the lining of the esophagus. What are the causes? This condition is caused by a problem with the muscle between the esophagus and the stomach (lower esophageal sphincter, or LES). Normally, the LES muscle closes after food  passes through the esophagus to the stomach. When the LES is weakened or abnormal, it does not close properly, and that allows food and stomach acid to go back up into the esophagus. The LES can be weakened by certain dietary substances, medicines, and medical conditions, including:  Tobacco use.  Pregnancy.  Having a hiatal hernia.  Alcohol use.  Certain foods and beverages, such as coffee, chocolate, onions, and peppermint. What increases the risk? You are more likely to develop this condition if you:  Have an increased body weight.  Have a connective tissue disorder.  Take NSAIDs, such as ibuprofen. What are the signs or symptoms? Symptoms of this condition include:  Heartburn.  Difficult or painful swallowing and the feeling of having a lump in the throat.  A bitter taste in the mouth.  Bad breath and having a large amount of saliva.  Having an upset or bloated stomach and belching.  Chest pain. Different conditions can cause chest pain. Make sure you see your health care provider if you experience chest pain.  Shortness of breath or wheezing.  Ongoing (chronic) cough or a nighttime cough.  Wearing away of tooth enamel.  Weight loss. How is this diagnosed? This condition may be diagnosed based on a medical history and a physical exam. To determine if you have mild or severe GERD, your health care provider may also monitor how you respond to treatment. You may also have tests, including:  A test to examine your stomach and esophagus with a small camera (endoscopy).  A test that measures the acidity level in  your esophagus.  A test that measures how much pressure is on your esophagus.  A barium swallow or modified barium swallow test to show the shape, size, and functioning of your esophagus. How is this treated? Treatment for this condition may vary depending on how severe your symptoms are. Your health care provider may recommend:  Changes to your  diet.  Medicine.  Surgery. The goal of treatment is to help relieve your symptoms and to prevent complications. Follow these instructions at home: Eating and drinking  Follow a diet as recommended by your health care provider. This may involve avoiding foods and drinks such as: ? Coffee and tea, with or without caffeine. ? Drinks that contain alcohol. ? Energy drinks and sports drinks. ? Carbonated drinks or sodas. ? Chocolate and cocoa. ? Peppermint and mint flavorings. ? Garlic and onions. ? Horseradish. ? Spicy and acidic foods, including peppers, chili powder, curry powder, vinegar, hot sauces, and barbecue sauce. ? Citrus fruit juices and citrus fruits, such as oranges, lemons, and limes. ? Tomato-based foods, such as red sauce, chili, salsa, and pizza with red sauce. ? Fried and fatty foods, such as donuts, french fries, potato chips, and high-fat dressings. ? High-fat meats, such as hot dogs and fatty cuts of red and white meats, such as rib eye steak, sausage, ham, and bacon. ? High-fat dairy items, such as whole milk, butter, and cream cheese.  Eat small, frequent meals instead of large meals.  Avoid drinking large amounts of liquid with your meals.  Avoid eating meals during the 2-3 hours before bedtime.  Avoid lying down right after you eat.  Do not exercise right after you eat.   Lifestyle  Do not use any products that contain nicotine or tobacco. These products include cigarettes, chewing tobacco, and vaping devices, such as e-cigarettes. If you need help quitting, ask your health care provider.  Try to reduce your stress by using methods such as yoga or meditation. If you need help reducing stress, ask your health care provider.  If you are overweight, reduce your weight to an amount that is healthy for you. Ask your health care provider for guidance about a safe weight loss goal.   General instructions  Pay attention to any changes in your symptoms.  Take  over-the-counter and prescription medicines only as told by your health care provider. Do not take aspirin, ibuprofen, or other NSAIDs unless your health care provider told you to take these medicines.  Wear loose-fitting clothing. Do not wear anything tight around your waist that causes pressure on your abdomen.  Raise (elevate) the head of your bed about 6 inches (15 cm). You can use a wedge to do this.  Avoid bending over if this makes your symptoms worse.  Keep all follow-up visits. This is important. Contact a health care provider if:  You have: ? New symptoms. ? Unexplained weight loss. ? Difficulty swallowing or it hurts to swallow. ? Wheezing or a persistent cough. ? A hoarse voice.  Your symptoms do not improve with treatment. Get help right away if:  You have sudden pain in your arms, neck, jaw, teeth, or back.  You suddenly feel sweaty, dizzy, or light-headed.  You have chest pain or shortness of breath.  You vomit and the vomit is green, yellow, or black, or it looks like blood or coffee grounds.  You faint.  You have stool that is red, bloody, or black.  You cannot swallow, drink, or eat. These symptoms may  represent a serious problem that is an emergency. Do not wait to see if the symptoms will go away. Get medical help right away. Call your local emergency services (911 in the [place] Do not drive yourself to the hospital. Summary  Gastroesophageal reflux happens when acid from the stomach flows up into the esophagus. GERD is a disease in which the reflux happens often, causes frequent or severe symptoms, or causes problems such as damage to the esophagus.  Treatment for this condition may vary depending on how severe your symptoms are. Your health care provider may recommend diet and lifestyle changes, medicine, or surgery.  Contact a health care provider if you have new or worsening symptoms.  Take over-the-counter and prescription medicines only as told by  your health care provider. Do not take aspirin, ibuprofen, or other NSAIDs unless your health care provider told you to do so.  Keep all follow-up visits as told by your health care provider. This is important. This information is not intended to replace advice given to you by your health care provider. Make sure you discuss any questions you have with your health care provider. Document Revised: 03/31/2020 Document Reviewed: 03/31/2020 Elsevier Patient Education  [place].   I appreciate the  opportunity to care for you  Thank You   Harl Bowie , MD

## 2020-11-07 ENCOUNTER — Ambulatory Visit (AMBULATORY_SURGERY_CENTER): Payer: Medicare Other | Admitting: Internal Medicine

## 2020-11-07 ENCOUNTER — Encounter: Payer: Self-pay | Admitting: Gastroenterology

## 2020-11-07 ENCOUNTER — Encounter: Payer: Medicare Other | Admitting: Internal Medicine

## 2020-11-07 ENCOUNTER — Encounter: Payer: Self-pay | Admitting: Internal Medicine

## 2020-11-07 ENCOUNTER — Other Ambulatory Visit: Payer: Self-pay

## 2020-11-07 VITALS — BP 151/66 | HR 62 | Temp 97.3°F | Resp 10 | Ht <= 58 in | Wt 100.0 lb

## 2020-11-07 DIAGNOSIS — K449 Diaphragmatic hernia without obstruction or gangrene: Secondary | ICD-10-CM | POA: Diagnosis not present

## 2020-11-07 DIAGNOSIS — K219 Gastro-esophageal reflux disease without esophagitis: Secondary | ICD-10-CM

## 2020-11-07 DIAGNOSIS — K222 Esophageal obstruction: Secondary | ICD-10-CM | POA: Diagnosis not present

## 2020-11-07 DIAGNOSIS — R1310 Dysphagia, unspecified: Secondary | ICD-10-CM | POA: Diagnosis not present

## 2020-11-07 MED ORDER — SODIUM CHLORIDE 0.9 % IV SOLN: 500.0000 mL | Freq: Once | INTRAVENOUS | Status: DC

## 2020-11-07 NOTE — Progress Notes (Signed)
Called to room to assist during endoscopic procedure.  Patient ID and intended procedure confirmed with present staff. Received instructions for my participation in the procedure from the performing physician.  

## 2020-11-07 NOTE — Progress Notes (Signed)
Medical history reviewed with no changes noted. VS assessed by S.M 

## 2020-11-07 NOTE — Op Note (Addendum)
[facility] Patient Name: Meghan Oliver Procedure Date: 11/07/2020 10:37 AM MRN: [id number] Endoscopist: Gatha Mayer , MD Age: 80 Referring MD:  Date of Birth: 1941/09/07 Gender: Female Account #: [id number] Procedure:                Upper GI endoscopy Indications:              Dysphagia, Esophageal reflux symptoms that persist                            despite appropriate therapy Medicines:                Propofol per Anesthesia, Monitored Anesthesia Care Procedure:                Pre-Anesthesia Assessment:                           - Prior to the procedure, a History and Physical                            was performed, and patient medications and                            allergies were reviewed. The patient's tolerance of                            previous anesthesia was also reviewed. The risks                            and benefits of the procedure and the sedation                            options and risks were discussed with the patient.                            All questions were answered, and informed consent                            was obtained. Prior Anticoagulants: The patient has                            taken no previous anticoagulant or antiplatelet                            agents. ASA Grade Assessment: III - A patient with                            severe systemic disease. After reviewing the risks                            and benefits, the patient was deemed in                            satisfactory condition to undergo the procedure.  After obtaining informed consent, the endoscope was                            passed under direct vision. Throughout the                            procedure, the patient's blood pressure, pulse, and                            oxygen saturations were monitored continuously. The                            Endoscope was introduced through the mouth, and                             advanced to the second part of duodenum. The upper                            GI endoscopy was accomplished without difficulty.                            The patient tolerated the procedure well. Lyla Son MD present for procedure with patient                            permission. Scope In: Scope Out: Findings:                 One benign-appearing, intrinsic moderate stenosis                            was found at the cricopharyngeus. The stenosis was                            traversed. The scope was withdrawn. Dilation was                            performed with a Maloney dilator with mild                            resistance at 50 Fr. The dilation site was examined                            following endoscope reinsertion and showed moderate                            mucosal disruption. Estimated blood loss was                            minimal.                           The Z-line was irregular and was found at  the                            gastroesophageal junction.                           Minimal esophagitis with no bleeding was found at                            the gastroesophageal junction.                           A 3 cm hiatal hernia was present.                           The gastroesophageal flap valve was visualized                            endoscopically and classified as Hill Grade IV (no                            fold, wide open lumen, hiatal hernia present).                           Patchy mildly erythematous mucosa was found in the                            gastric antrum.                           The exam was otherwise without abnormality.                           The cardia and gastric fundus were normal on                            retroflexion. Complications:            No immediate complications. Estimated Blood Loss:     Estimated blood loss was minimal. Impression:               - Benign-appearing esophageal  stenosis at                            cricopharyngeus. Dilated.                           - Z-line irregular, at the gastroesophageal                            junction.                           - Minimal reflux esophagitis with no bleeding.                           - 3 cm hiatal hernia.                           -  Gastroesophageal flap valve classified as Hill                            Grade IV (no fold, wide open lumen, hiatal hernia                            present).                           - Erythematous mucosa in the antrum.                           - The examination was otherwise normal.                           - No specimens collected. Recommendation:           - Patient has a contact number available for                            emergencies. The signs and symptoms of potential                            delayed complications were discussed with the                            patient. Return to normal activities tomorrow.                            Written discharge instructions were provided to the                            patient.                           - Clear liquids x 1 hour then soft foods rest of                            day. Start prior diet tomorrow.                           - Continue present medications. Dexilant 60 mg and                            prn carafate Gatha Mayer, MD 11/07/2020 11:16:43 AM This report has been signed electronically.

## 2020-11-07 NOTE — Patient Instructions (Addendum)
I dilated the esophagus and opened up a stricture at the top of the esophagus. You should swallow better now.  There was slight inflammation in the esophagus and a hiatal hernia as we knew.  I hope this makes a big difference.  I will let Dr. Silverio Decamp know.  I appreciate the opportunity to care for you. Gatha Mayer, MD, FACG  YOU HAD AN ENDOSCOPIC PROCEDURE TODAY AT [facility]:   Refer to the procedure report that was given to you for any specific questions about what was found during the examination.  If the procedure report does not answer your questions, please call your gastroenterologist to clarify.  If you requested that your care partner not be given the details of your procedure findings, then the procedure report has been included in a sealed envelope for you to review at your convenience later.  YOU SHOULD EXPECT: Some feelings of bloating in the abdomen. Passage of more gas than usual.  Walking can help get rid of the air that was put into your GI tract during the procedure and reduce the bloating. If you had a lower endoscopy (such as a colonoscopy or flexible sigmoidoscopy) you may notice spotting of blood in your stool or on the toilet paper. If you underwent a bowel prep for your procedure, you may not have a normal bowel movement for a few days.  Please Note:  You might notice some irritation and congestion in your nose or some drainage.  This is from the oxygen used during your procedure.  There is no need for concern and it should clear up in a day or so.  SYMPTOMS TO REPORT IMMEDIATELY:   Following upper endoscopy (EGD)  Vomiting of blood or coffee ground material  New chest pain or pain under the shoulder blades  Painful or persistently difficult swallowing  New shortness of breath  Fever of 100F or higher  Black, tarry-looking stools  For urgent or emergent issues, a gastroenterologist can be reached at any hour by calling [phone]. Do  not use MyChart messaging for urgent concerns.    DIET:  We do recommend a small meal at first, but then you may proceed to your regular diet.  Drink plenty of fluids but you should avoid alcoholic beverages for 24 hours.  ACTIVITY:  You should plan to take it easy for the rest of today and you should NOT DRIVE or use heavy machinery until tomorrow (because of the sedation medicines used during the test).    FOLLOW UP: Our staff will call the number listed on your records 48-72 hours following your procedure to check on you and address any questions or concerns that you may have regarding the information given to you following your procedure. If we do not reach you, we will leave a message.  We will attempt to reach you two times.  During this call, we will ask if you have developed any symptoms of COVID 19. If you develop any symptoms (ie: fever, flu-like symptoms, shortness of breath, cough etc.) before then, please call [phone].  If you test positive for Covid 19 in the 2 weeks post procedure, please call and report this information to [name].    If any biopsies were taken you will be contacted by phone or by letter within the next 1-3 weeks.  Please call us at [phone] if you have not heard about the biopsies in 3 weeks.    SIGNATURES/CONFIDENTIALITY: You and/or your care partner  have signed paperwork which will be entered into your electronic medical record.  These signatures attest to the fact that that the information above on your After Visit Summary has been reviewed and is understood.  Full responsibility of the confidentiality of this discharge information lies with you and/or your care-partner.

## 2020-11-11 ENCOUNTER — Telehealth: Payer: Self-pay | Admitting: *Deleted

## 2020-11-11 NOTE — Telephone Encounter (Signed)
Follow up call made. 

## 2020-11-11 NOTE — Telephone Encounter (Signed)
Attempted 2nd f/u phone call. No answer. Left message.  °

## 2020-12-01 ENCOUNTER — Ambulatory Visit: Payer: Medicare Other | Admitting: Psychiatry

## 2020-12-26 ENCOUNTER — Other Ambulatory Visit: Payer: Self-pay

## 2020-12-26 DIAGNOSIS — M810 Age-related osteoporosis without current pathological fracture: Secondary | ICD-10-CM

## 2021-01-12 ENCOUNTER — Telehealth: Payer: Self-pay | Admitting: Internal Medicine

## 2021-01-12 MED ORDER — TOVIAZ 8 MG PO TB24: 8.0000 mg | ORAL_TABLET | Freq: Every day | ORAL | 3 refills | Status: DC

## 2021-01-12 NOTE — Telephone Encounter (Signed)
Received Fax RX request from  [facility] [place], [place] [facility] [place] Phone:  [phone]  Fax:  [phone]       Medication - Toviaz 8 mg  Last Refill - 10/06/2020  Last OV - 01/30/20  Last CPE - 08/20/2020 by phone [facility]  Next Appointment -

## 2021-01-12 NOTE — Telephone Encounter (Signed)
Refill x one year °

## 2021-02-04 ENCOUNTER — Other Ambulatory Visit: Payer: Self-pay | Admitting: Psychiatry

## 2021-02-16 DIAGNOSIS — Z961 Presence of intraocular lens: Secondary | ICD-10-CM | POA: Diagnosis not present

## 2021-02-16 DIAGNOSIS — H35363 Drusen (degenerative) of macula, bilateral: Secondary | ICD-10-CM | POA: Diagnosis not present

## 2021-02-16 DIAGNOSIS — H35721 Serous detachment of retinal pigment epithelium, right eye: Secondary | ICD-10-CM | POA: Diagnosis not present

## 2021-02-16 DIAGNOSIS — H353132 Nonexudative age-related macular degeneration, bilateral, intermediate dry stage: Secondary | ICD-10-CM | POA: Diagnosis not present

## 2021-02-16 DIAGNOSIS — H35453 Secondary pigmentary degeneration, bilateral: Secondary | ICD-10-CM | POA: Diagnosis not present

## 2021-02-20 ENCOUNTER — Other Ambulatory Visit: Payer: Self-pay

## 2021-02-20 ENCOUNTER — Other Ambulatory Visit: Payer: Medicare Other | Admitting: Internal Medicine

## 2021-02-20 DIAGNOSIS — E7800 Pure hypercholesterolemia, unspecified: Secondary | ICD-10-CM | POA: Diagnosis not present

## 2021-02-20 DIAGNOSIS — F310 Bipolar disorder, current episode hypomanic: Secondary | ICD-10-CM | POA: Diagnosis not present

## 2021-02-23 ENCOUNTER — Other Ambulatory Visit: Payer: Self-pay

## 2021-02-23 ENCOUNTER — Telehealth: Payer: Self-pay | Admitting: Internal Medicine

## 2021-02-23 ENCOUNTER — Ambulatory Visit (INDEPENDENT_AMBULATORY_CARE_PROVIDER_SITE_OTHER): Payer: Medicare Other | Admitting: Internal Medicine

## 2021-02-23 VITALS — BP 110/60 | HR 61 | Ht <= 58 in | Wt 97.0 lb

## 2021-02-23 DIAGNOSIS — R413 Other amnesia: Secondary | ICD-10-CM

## 2021-02-23 DIAGNOSIS — I10 Essential (primary) hypertension: Secondary | ICD-10-CM

## 2021-02-23 DIAGNOSIS — M810 Age-related osteoporosis without current pathological fracture: Secondary | ICD-10-CM

## 2021-02-23 DIAGNOSIS — Z950 Presence of cardiac pacemaker: Secondary | ICD-10-CM | POA: Diagnosis not present

## 2021-02-23 DIAGNOSIS — E7800 Pure hypercholesterolemia, unspecified: Secondary | ICD-10-CM

## 2021-02-23 DIAGNOSIS — F3181 Bipolar II disorder: Secondary | ICD-10-CM | POA: Diagnosis not present

## 2021-02-23 DIAGNOSIS — Z9882 Breast implant status: Secondary | ICD-10-CM

## 2021-02-23 DIAGNOSIS — R350 Frequency of micturition: Secondary | ICD-10-CM | POA: Diagnosis not present

## 2021-02-23 DIAGNOSIS — B351 Tinea unguium: Secondary | ICD-10-CM

## 2021-02-23 DIAGNOSIS — E039 Hypothyroidism, unspecified: Secondary | ICD-10-CM

## 2021-02-23 LAB — HEPATIC FUNCTION PANEL
AG Ratio: 1.8 (calc) (ref 1.0–2.5)
ALT: 98 U/L — ABNORMAL HIGH (ref 6–29)
AST: 65 U/L — ABNORMAL HIGH (ref 10–35)
Albumin: 4.2 g/dL (ref 3.6–5.1)
Alkaline phosphatase (APISO): 73 U/L (ref 37–153)
Bilirubin, Direct: 0.1 mg/dL (ref 0.0–0.2)
Globulin: 2.4 g/dL (calc) (ref 1.9–3.7)
Indirect Bilirubin: 0.2 mg/dL (calc) (ref 0.2–1.2)
Total Bilirubin: 0.3 mg/dL (ref 0.2–1.2)
Total Protein: 6.6 g/dL (ref 6.1–8.1)

## 2021-02-23 LAB — LIPID PANEL
Cholesterol: 195 mg/dL (ref <200)
HDL: 107 mg/dL (ref >=50)
LDL Cholesterol (Calc): 75 mg/dL (calc)
Non-HDL Cholesterol (Calc): 88 mg/dL (calc) (ref <130)
Total CHOL/HDL Ratio: 1.8 (calc) (ref <5.0)
Triglycerides: 53 mg/dL (ref <150)

## 2021-02-23 LAB — LITHIUM LEVEL: Lithium Lvl: 0.6 mmol/L (ref 0.6–1.2)

## 2021-02-23 LAB — NORTRIPTYLINE LEVEL: Nortriptyline Lvl: 49 mcg/L — ABNORMAL LOW (ref 50–150)

## 2021-02-23 NOTE — Progress Notes (Signed)
   Subjective:    Patient ID: Meghan Oliver, female    DOB: 28-Apr-1941, 80 y.o.   MRN: [id number]  HPI 80 year old Female seen for 6 month recheck.  Has had some issues with urinary frequency and has been prescribed Toviaz per Urologist.  Spent some time in [place].  Has been exercising.  Dr. 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  She is on Dexilant 60 mg daily.  History of hypertension treated with Avapro  Continues close follow-up with Dr.Cottle.  Has not needed ECT in some time.  Adjusted well with moved to [place].  Elderly dog not doing well.  Nortriptyline and lithium levels forwarded to [name].  Continues with mild elevation of liver functions.  These are slightly higher in October 2021 at which time SGOT was 48 and is now 63.  SGPT was 48 and is now 98.  Nortriptyline level slightly low at 49.  Some issues with onychomycosis and suggested Dr. Paulla Dolly see her.  Also has bunion.  Lipid panel normal on Crestor 40 mg daily.  Continue to monitor liver functions.  On multiple medications for bipolar disorder and depression.  These were reviewed.  Also on Namenda per [name].  Review of Systems has lost 5 pounds since April 2021.     Objective:   Physical Exam Blood pressure excellent at 110/60 pulse 61 regular pulse oximetry 97% weight is 97 pounds.  BMI 20.27  Skin: Warm and dry.  No thyromegaly or carotid bruits.  Chest clear to auscultation.  Cardiac exam: Regular rate and rhythm.  Affect is pleasant and cooperative.  No lower extremity edema.       Assessment & Plan:  Urinary frequency-recently started Toviaz per Urologist  Onychomycosis-May be able to have some debridement of toenails by podiatrist and have bunion addressed  Esophageal dilatation by Dr. Carlean Purl and is on PPI (Dexilant)  Hypothyroidism treated with thyroid replacement medication  History of bipolar disorder followed  closely by [name].  Is on multiple medications but is stable and has not had recent ECT.  Essential hypertension-stable on current regimen and well-controlled  Hyperlipidemia stable on Crestor 40 mg daily and lipid panel within normal limits.   Elevated liver functions- monitoring liver functions with multiple medications that can raise liver functions.  Now on Namenda per [name] 10 mg daily since August 2021.  Reports some mild memory issues.  Coronary artery disease followed closely by [facility]ology  History of pacemaker 2019 followed by Dr. Lovena Le with history of symptomatic bradycardia due to sinus node dysfunction  Plan: Return in 6 months for Medicare wellness and health maintenance exam as well as fasting labs.  Had faxed recent labs done through this office to [name].

## 2021-02-23 NOTE — Patient Instructions (Addendum)
Covid Booster recommended.  Bone density and mammogram ordered at [facility].  Labs faxed to [name].  Return in 6 months for Medicare wellness, fasting labs and health maintenance exam.

## 2021-02-23 NOTE — Progress Notes (Signed)
We will stable at 0.6.  Nortriptyline is borderline at 49 which should be acceptable.  But we will follow clinically.  Liver enzymes slightly elevated

## 2021-02-23 NOTE — Progress Notes (Signed)
Creatinine is stable.

## 2021-02-23 NOTE — Telephone Encounter (Signed)
Faxed lab results to Dr Lynder Parents [phone]

## 2021-02-24 ENCOUNTER — Other Ambulatory Visit: Payer: Self-pay | Admitting: Psychiatry

## 2021-02-27 ENCOUNTER — Telehealth: Payer: Self-pay | Admitting: Internal Medicine

## 2021-02-27 ENCOUNTER — Other Ambulatory Visit: Payer: Self-pay

## 2021-02-27 ENCOUNTER — Ambulatory Visit (INDEPENDENT_AMBULATORY_CARE_PROVIDER_SITE_OTHER): Payer: Medicare Other | Admitting: Psychiatry

## 2021-02-27 DIAGNOSIS — G3184 Mild cognitive impairment, so stated: Secondary | ICD-10-CM

## 2021-02-27 DIAGNOSIS — F411 Generalized anxiety disorder: Secondary | ICD-10-CM

## 2021-02-27 DIAGNOSIS — Z79899 Other long term (current) drug therapy: Secondary | ICD-10-CM | POA: Diagnosis not present

## 2021-02-27 DIAGNOSIS — G251 Drug-induced tremor: Secondary | ICD-10-CM | POA: Diagnosis not present

## 2021-02-27 DIAGNOSIS — F3175 Bipolar disorder, in partial remission, most recent episode depressed: Secondary | ICD-10-CM | POA: Diagnosis not present

## 2021-02-27 DIAGNOSIS — F061 Catatonic disorder due to known physiological condition: Secondary | ICD-10-CM | POA: Diagnosis not present

## 2021-02-27 MED ORDER — ROSUVASTATIN CALCIUM 40 MG PO TABS: 40.0000 mg | ORAL_TABLET | Freq: Every day | ORAL | 3 refills | Status: DC

## 2021-02-27 NOTE — Telephone Encounter (Signed)
Send in generic Crestor 40 mg #90 with 3 refills

## 2021-02-27 NOTE — Progress Notes (Signed)
LEILY CAPEK [id number] 05-09-1941 80 y.o.  Subjective:   Patient ID:  KADELYN DIMASCIO is a 80 y.o. (DOB 07-Jul-1941) female. Patient was last seen August 21, 2018 Chief Complaint:  Chief Complaint  Patient presents with  . Follow-up  . Depression  . Memory Loss  . Anxiety   Kathaleen Bury presents to the office today for follow-up of Severe TR bipolar depression with psychotic features including catatonia.  11/30/2018 was the last visit and the following was noted: Had ECT yesterday at [facility].  Last was Jan 8 and was doing well then.  Next scheduled April 15.  Had a lithium level from there which is pending. Pretty good until the last 10 days to 2 weeks with less energy and motivation.  Bothered by old sick dog.  Worried about how that will affect her.  Has slept with the dog.  Enjoyed [place].  Going to gym and playing [place].  Took the class and didn't feel she caught on quickly. Wonders if it is bc of the ECT memory effects.  Bridge is harder and not playing.  Planning to return to [facility] mid April.   spent the winter in [place] per usual..  No significant depression since here. ECT frequency about 6 weeks.  Gets very anxious the day of the ECT.  ECT consistently for a year.  Best in mood in 7-8 years.  Also the pacemaker helped.  Friends notice the benefit.  Plan:  Option increase in the nortriptyline if needed.  Considered this.  They agree and would like to increase nortriptyline to 75 to try to reduce tendency to depression before the ECT.   03/19/19  Addendum: Here the contents from an email from the nurse for Ms. Buchholz  Hello [name],  I hope you and your family are healthy and well. I took Rayel to [facility] for ECT and here are labs from 01/26/19. I am going to also forward them to [facility] for follow up with her LFT's. They are still higher than I would like. Her lithium level was 0.51 on a daily dose of 300mg  qohs x 4 nights- alternating with 450mg  qohs x 3 nights. She is not  demonstrating any signs of elevated levels, minimal hand tremors, less anxiety and restlessness since her ECT treatment. Her Nortryptyline level was 140 on 75mg  qhs. I would like to continue her current dosages of both the above meds and recheck her lithium level at her next scheduled ECT of 03/23/19. Please let me know of any changes you would like to make. Take care, [name] RN Lithium level was 0.4 on the 450mg  alt with 300 mg QOD. No changes were made in meds.  11/15/2019 phone call: Telephone call from patient's husband [name] on 11/14/2019 Patient is scheduled for ECT soon for maintenance ECT.  He thinks it is been 2-1/2 months since the last ECT but he is going to check the date for sure.  It is still be done being done at [facility].  He is wondering about skipping this treatment because the patient has continued to be free of depression and seems cognitively clearer as these ECT treatments have been spread out further from each other.  Patient remains on medications as prescribed. If it is truly been over 2 months since the last ECT then it is reasonable to consider discontinuing ECT.  It is unlikely that ECT with the frequency of greater than 2 months is significantly helpful at preventing her  relapse.  If however the ECT frequency is less than every 2 months it could still be helping to prevent recurrence.  He indicated he would consider this information and discuss it with her ECT team and make a decision.  They are heading to [place] in the next few weeks. Needs to schedule an appointment with me for follow-up because it is been many months.  He agrees.  02/19/2020 appt, the following noted: Moving to [place] in a few months.  Ready to downsize.   Stopped ECT as discussed and has not been more deprressed.   Walks dog 4 times daily.   Lithium 0.7 on  01/30/20 on lithium 300 mg plus 150 mg on M, W, F Nortriptyline 70 on 75 mg HS. No SE except tremor.  Balance is not  great.  Very happy.   No depression since here.  No mood swings.  Sleep and appetite is OK.  No med changes:  04/03/2020 TC with the following noted: Patient's husband called stating that her tremor was a little worse and she was having some stutter. No evidence of stroke otherwise. First thing would be to check serum lithium level and BMP.  Order written.  Have asked her husband to let us know about the lab to which it should be sent.  04/22/20 TC witht he following noted: Lithium level is not dangerously high but it is has crept up to 1.0 which is higher than desired for her.  Our goal for her is 0.5-0.7.  At the current level it is likely causing side effects so we should reduce the dosage from lithium 300 mg plus 150 mg on M, W, F  TO 300 mg daily.  Meaning drop off the 150 mg capsule.  It will take about 2 weeks for the level to gradually come down to the desired level.  05/08/2020 appt with the following noted: Seen with H and [name] nurse. Moving to [facility] and feels OK about it.  I think I'll be fine.   Pretty good with balance.  Doing yoga and it helps. [name] has cancer. Old dog 100 & 1/2 yo still living. [name]'s concerns when went to [place] for D's BD she had difficulty time with tremor lethargy, more confusion.  Better when go back home. [name] notes using more lorazepam. At times gait is unsteady. Not significantly depressed.  Can be anxious at times.  Eating okay.  Sleeping okay.  Is easily confused under stress. Plan: reduce lithium to 300 mg nightly.  07/14/20 appt with the following noted: Seen with her husband as well as her family nurse. Feeling fine and pleased with that.   Some anxiety over the move to smaller place. [name]'s twin also having heart problems Gene. [name]'s kids came and visited.    Plan: Continue nortriptyline 75 mg, paroxetine 20 mg, lithium 300 mg, Namenda 10 mg twice daily, lorazepam 0.5 mg 1/2-1 3 times daily as needed, mirtazapine 7.5 mg  nightly  02/27/2021 appointment with the following noted:  Seen with H and nurse Heber email from nurse [name] on 02/24/2021 indicated that he had moved into [facility] in May.  Sheretta has gradually become more depressed and more anxious using lorazepam as prescribed 3 times daily.  More memory issues.  She has remained engaged and initiating appropriate conversation.  She is very worried about her 49 year old dog who is in poor health and fears she will become more depressed when the dog dies.  She and  her husband do not wish to prefer to pursue further ECT unless absolutely necessary.  Likes [facility] so far.   No depression by her report.  Sold big house in Feb.  It worked out well.   Worries about her dog 6 yo will die soon. H agrees she's done well with depression.   Enjoys things and has interests.   Patient reports stable mood and denies depressed or irritable moods.  Patient denies any recent difficulty with anxiety except as noted.  Patient denies difficulty with sleep initiation or maintenance. Denies appetite disturbance.   I eat if I like what is served.  Lost 5 # in last year.  Patient has some difficulty with concentration.  Patient denies any suicidal ideation.  Usually takes Ativan at night and sleeps well 8 hours.    No current alcohol problems  Saw Dr. Carles Collet last week and no change in neuro meds.  Past Psychiatric Medication Trials: Prozac with lithium and Zyprexa 2.5 to 5 mg, nortriptyline, paroxetine, ,  pramipexole, mirtazapine, Namenda, lorazepam,  Rexulti, Abilify, Vraylar, Latuda 10 mg, lamotrigine.   this loss list is not exhaustive  Review of Systems:  Review of Systems  Constitutional: Negative for fatigue and unexpected weight change.  Neurological: Positive for tremors. Negative for weakness.       Some balance issues  Psychiatric/Behavioral: Negative for agitation, behavioral problems, confusion, decreased concentration, dysphoric  mood, hallucinations, self-injury, sleep disturbance and suicidal ideas. The patient is not nervous/anxious and is not hyperactive.     Medications: I have reviewed the patient's current medications.  Current Outpatient Medications  Medication Sig Dispense Refill  . ASPIRIN 81 PO Take by mouth.    . Calcium Carbonate-Vitamin D (CALCIUM 600/VITAMIN D PO) Take 1 tablet by mouth 2 (two) times daily.    . cetirizine (ZYRTEC) 10 MG tablet Take 10 mg by mouth daily as needed for allergies.    . Cholecalciferol (VITAMIN D-3) 5000 units TABS Take 5,000 Units by mouth daily with breakfast.     . cloNIDine (CATAPRES) 0.1 MG tablet TAKE 1 TABLET BY MOUTH DAILY AS NEEDED.( TAKE BEFORE ECT TREATMENTS) 10 tablet 1  . clotrimazole-betamethasone (LOTRISONE) cream Apply 1 application topically 2 (two) times daily. 30 g 0  . dexlansoprazole (DEXILANT) 60 MG capsule Take 1 capsule (60 mg total) by mouth daily. 90 capsule 3  . fesoterodine (TOVIAZ) 8 MG TB24 tablet Take 1 tablet (8 mg total) by mouth daily. 90 tablet 3  . irbesartan (AVAPRO) 150 MG tablet Take 150 mg by mouth daily.    Javier Docker Oil 500 MG CAPS Take 1 capsule by mouth daily.    Marland Kitchen lactulose (CHRONULAC) 10 GM/15ML solution TAKE 15 ML BY MOUTH DAILY AS NEEDED FOR MILD CONSTIPATION 1350 mL 1  . Lavender Oil 80 MG CAPS Take 160 mg by mouth at bedtime.    Marland Kitchen levothyroxine (SYNTHROID) 75 MCG tablet TAKE 1 TABLET(75 MCG) BY MOUTH DAILY 90 tablet 0  . lithium carbonate 300 MG capsule TAKE 1 CAPSULE(300 MG) BY MOUTH AT BEDTIME 90 capsule 1  . LORazepam (ATIVAN) 0.5 MG tablet TAKE 1/2 TO 1 TABLET BY MOUTH EVERY NIGHT AT BEDTIME AND 1 TABLET EVERY 8 HOURS AS NEEDED FOR ANXIETY. 180 tablet 0  . memantine (NAMENDA) 10 MG tablet Take 1 tablet (10 mg total) by mouth 2 (two) times daily. 180 tablet 3  . Methylfol-Algae-B12-Acetylcyst (CEREFOLIN NAC) 6-90.314-2-600 MG TABS TAKE 1 CAPLET BY MOUTH THREE TIMES A DAY (Patient taking differently: 2 (  two) times daily.) 270  tablet 0  . mirtazapine (REMERON) 7.5 MG tablet TAKE 1 TABLET(7.5 MG) BY MOUTH AT BEDTIME 90 tablet 3  . Multiple Vitamins-Minerals (MULTIVITAMIN WITH MINERALS) tablet Take 1 tablet by mouth daily.    . Multiple Vitamins-Minerals (PRESERVISION AREDS 2 PO) Take 1 capsule by mouth in the morning and at bedtime.    . nortriptyline (PAMELOR) 25 MG capsule TAKE 3 CAPSULES(75 MG) BY MOUTH AT BEDTIME 270 capsule 3  . nystatin-triamcinolone (MYCOLOG II) cream Apply 1 application topically 2 (two) times daily. (Patient taking differently: Apply 1 application topically as needed.) 30 g 0  . Omega-3 Fatty Acids (OMEGA 3 PO) Take 1 capsule by mouth at bedtime.    Marland Kitchen OVER THE COUNTER MEDICATION Take 1 tablet by mouth 2 (two) times daily. *Cocavia*    . PARoxetine (PAXIL) 20 MG tablet TAKE 1 TABLET(20 MG) BY MOUTH AT BEDTIME 90 tablet 3  . polyethylene glycol (MIRALAX / GLYCOLAX) 17 g packet Take 17 g by mouth as needed.    . primidone (MYSOLINE) 50 MG tablet TAKE 2 TABLETS EVERY MORNING AND 1 TABLET EVERY EVENING 270 tablet 0  . Pyridoxine HCl (VITAMIN B-6) 500 MG tablet Take 500 mg by mouth 2 (two) times daily.    . sennosides-docusate sodium (SENOKOT-S) 8.6-50 MG tablet Take 2 tablets by mouth at bedtime.    . sucralfate (CARAFATE) 1 GM/10ML suspension Take 10 mLs (1 g total) by mouth 4 (four) times daily -  with meals and at bedtime. 420 mL 1  . valACYclovir (VALTREX) 1000 MG tablet Take 1 tablet by mouth as needed. Use as directed    . vitamin B-12 (CYANOCOBALAMIN) 1000 MCG tablet Take 1,000 mcg by mouth at bedtime.    . rosuvastatin (CRESTOR) 40 MG tablet Take 1 tablet (40 mg total) by mouth daily. MUST BE SEEN IN OFFICE FOR FURTHER REFILLS 90 tablet 3   No current facility-administered medications for this visit.    Medication Side Effects: Other: tremor stable.  No worse.  Allergies:  Allergies  Allergen Reactions  . Propranolol Other (See Comments)    Low blood pressure  . Brexpiprazole Other  (See Comments)    Aphasia and catatonia    Past Medical History:  Diagnosis Date  . Anxiety   . Arthritis    "hips, spine" (03/17/2018)  . Bipolar II disorder ([place])   . Chronic bronchitis ([place])   . Chronic lower back pain   . Chronic right hip pain   . CKD (chronic kidney disease), stage II   . Esophagitis, erosive   . GAD (generalized anxiety disorder)   . GERD (gastroesophageal reflux disease)   . Headache    "maybe monthly" (03/17/2018))  . Heart murmur, systolic   . History of adenomatous polyp of colon    08-04-2016  tubular adenoma  . History of blood transfusion 12/2017   "related to vascular hematoma"  . History of electroconvulsive therapy    at [facility]--  started 04-15-2015 to 11-17-2016  total greater than 40 times  . History of hiatal hernia   . Hyperlipidemia   . Hypertension   . Hypothyroidism   . Internal carotid artery stenosis, bilateral    per last duplex 05-01-2014  bilateral ICA 40-59%  . Major depression, chronic    ECT treatments extensive and multiple started 07/ 2016  . Memory loss    "both short and long-term; needs frequent reminders to follow instrucitons" (05/16/2017)  . Migraines    "none  in years" (03/17/2018)  . OSA (obstructive sleep apnea)    per study 06/ 2012 moderate OSA  ; "refuses to wear masks" (03/17/2018)  . Osteoporosis   . Pulmonary nodule    monitored by pcp  . S/P placement of cardiac pacemaker 03/17/18 [facility]  03/18/2018    Family History  Problem Relation Age of Onset  . Heart attack Father 89       deceased  . Hypertension Father   . Heart disease Father   . Breast cancer Paternal Grandmother        Age unknown  . Breast cancer Paternal Aunt        Age 15's  . Colon cancer Neg Hx     Social History   Socioeconomic History  . Marital status: Married    Spouse name: Dr. Lyla Son  . Number of children: 2  . Years of education: Not on file  . Highest education level: Not on file  Occupational History  .  Occupation: housewife    Employer: UNEMPLOYED  Tobacco Use  . Smoking status: Former Smoker    Packs/day: 2.00    Years: 15.00    Pack years: 30.00    Types: Cigarettes    Quit date: 01/13/1971    Years since quitting: 50.1  . Smokeless tobacco: Never Used  Vaping Use  . Vaping Use: Never used  Substance and Sexual Activity  . Alcohol use: Yes    Alcohol/week: 0.0 standard drinks    Comment: occassional  . Drug use: Never  . Sexual activity: Not Currently    Comment: intercourse age 17, sexual partners less than 5  Other Topics Concern  . Not on file  Social History Narrative  . Not on file   Social Determinants of Health   Financial Resource Strain: Not on file  Food Insecurity: Not on file  Transportation Needs: Not on file  Physical Activity: Not on file  Stress: Not on file  Social Connections: Not on file  Intimate Partner Violence: Not on file    Past Medical History, Surgical history, Social history, and Family history were reviewed and updated as appropriate.   Please see review of systems for further details on the patient's review from today.   Objective:   Physical Exam:  LMP  (LMP Unknown)   Physical Exam Constitutional:      General: She is not in acute distress.    Appearance: She is well-developed.  Musculoskeletal:        General: No deformity.  Neurological:     Mental Status: She is alert and oriented to person, place, and time.     Motor: No tremor.     Coordination: Coordination abnormal.  Psychiatric:        Attention and Perception: She is attentive.        Mood and Affect: Mood is anxious. Mood is not depressed. Affect is not labile, blunt, angry, tearful or inappropriate.        Speech: Speech is not rapid and pressured or slurred.        Behavior: Behavior normal. Behavior is not slowed or withdrawn.        Thought Content: Thought content normal. Thought content does not include homicidal or suicidal ideation. Thought content does  not include homicidal or suicidal plan.        Cognition and Memory: She exhibits impaired recent memory.        Judgment: Judgment normal.     Comments:  Insight intact. No auditory or visual hallucinations.  meticulous appearance bright affect     Lab Review:     Component Value Date/Time   NA 138 07/10/2020 1021   NA 136 04/20/2017 1151   K 4.5 07/10/2020 1021   CL 104 07/10/2020 1021   CO2 24 07/10/2020 1021   GLUCOSE 123 (H) 07/10/2020 1021   BUN 25 07/10/2020 1021   BUN 24 04/20/2017 1151   CREATININE 1.05 (H) 07/10/2020 1021   CALCIUM 9.6 07/10/2020 1021   PROT 6.6 02/20/2021 1100   PROT 6.2 04/20/2017 1151   ALBUMIN 3.9 08/08/2017 1615   ALBUMIN 3.9 04/20/2017 1151   AST 65 (H) 02/20/2021 1100   ALT 98 (H) 02/20/2021 1100   ALKPHOS 46 08/08/2017 1615   BILITOT 0.3 02/20/2021 1100   BILITOT <0.2 04/20/2017 1151   GFRNONAA 50 (L) 07/10/2020 1021   GFRAA 58 (L) 07/10/2020 1021       Component Value Date/Time   WBC 6.3 05/15/2019 1215   RBC 4.15 05/15/2019 1215   HGB 13.7 05/15/2019 1215   HGB 10.2 (L) 04/20/2017 1151   HCT 40.8 05/15/2019 1215   HCT 29.6 (L) 04/20/2017 1151   PLT 243 05/15/2019 1215   PLT 385 (H) 04/20/2017 1151   MCV 98.3 05/15/2019 1215   MCV 89 04/20/2017 1151   MCH 33.0 05/15/2019 1215   MCHC 33.6 05/15/2019 1215   RDW 12.0 05/15/2019 1215   RDW 13.9 04/20/2017 1151   LYMPHSABS 1,764 05/15/2019 1215   LYMPHSABS 1.5 04/20/2017 1151   MONOABS 0.5 03/09/2017 1232   EOSABS 82 05/15/2019 1215   EOSABS 0.3 04/20/2017 1151   BASOSABS 38 05/15/2019 1215   BASOSABS 0.0 04/20/2017 1151    Lithium Lvl  Date Value Ref Range Status  02/20/2021 0.6 0.6 - 1.2 mmol/L Final  Nortriptyline level 51 on 50 mg nightly with paroxetine 20 mg daily  07/10/2020 lithium level 0.6 at [facility] office and nortriptyline level 130 on the current dosages of lithium 300 mg nightly and nortriptyline 75 mg nightly  02/20/2021 labs lithium 0.6 on 300 mg  nightly.  Nortriptyline level 49 which is lower than expected on 75 mg nightly along with paroxetine 20 mg daily.  No results found for: PHENYTOIN, PHENOBARB, VALPROATE, CBMZ   .res Assessment: Plan:    Bipolar I disorder, mild, current or most recent episode depressed, in partial remission, with catatonia ([place])  Mild cognitive impairment  Lithium-induced tremor  Lithium use  Generalized anxiety disorder    History of lithium toxicity repeatedly.  Greater than 50% of 50 min face to face time with patient her nurse and her husband was spent on counseling and coordination of care.  Her severe treatment resistant bipolar depression is currently in remission.  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  During the severe depression she has catatonia.  She had required longterm ECT.  A previous episode of depression approximately 10 years ago stayed in remission on Paxil and nortriptyline and lithium for a period of years until the lithium level was decreased due to tremor.  Now we are maintaining an adequate lithium level.  .  Depression and anxiety much improved.     She seems to be tolerating the meds well other than the lithium tremor which is being fairly  managed with primidone.  She is continuing under the care of her neurologist Dr. Carles Collet.   From records it appears her last ECT was in  September 2020.  Her depression has remained in remission since that time.  The nortriptyline was increased to 75 to try to reduce tendency to depression in February 2020.  Levels are noted.  02/20/2021 labs lithium 0.6 on 300 mg nightly.  Nortriptyline level 49 which is lower than expected on 75 mg nightly along with paroxetine 20 mg daily.  Lithium level goals are between 0.5 and 0.7.  She has a history of lithium toxicity when she was taking chlorthalidone with the lithium.   Discussed signs and symptoms of lithium toxicity.  Discussed the risk of increasing  depression if the lithium level gets too low.  Lithium tremor is overall little better with the reduction in lithium. She cannot tolerate higher doses of lithium.  She is concerned that her memory has been affected by long-term ECT.  There remains some concern about drug interactions between paroxetine and nortriptyline and given her age we do not want the nortriptyline levels higher than necessary .  However the nortriptyline level was not high.  Discussed the difficulties they have with travel.  She can get confused with new environments and excessive stimulation.  She does better with routine. Overall memory appears to be stable and she has reasonable recall for recent family events.  Continue Lithium 300 mg daily  Continue nortriptyline 75 mg daily Continue paroxetine 20 mg daily Continue lorazepam 0.5 mg 3 times daily for both anxiety and catatonia Continue vitamin B6 and primidone for tremor  This appointment was 50 minutes.   Follow-up 3 months  Lynder Parents MD, DFAPA Please see After Visit Summary for patient specific instructions.  Future Appointments  Date Time Provider [place]  05/13/2021  4:00 PM Cottle, Billey Co., MD CP-CP None    No orders of the defined types were placed in this encounter.     -------------------------------

## 2021-03-03 DIAGNOSIS — H353112 Nonexudative age-related macular degeneration, right eye, intermediate dry stage: Secondary | ICD-10-CM | POA: Diagnosis not present

## 2021-03-05 ENCOUNTER — Encounter: Payer: Self-pay | Admitting: Psychiatry

## 2021-03-06 DIAGNOSIS — U071 COVID-19: Secondary | ICD-10-CM | POA: Diagnosis not present

## 2021-03-06 DIAGNOSIS — Z23 Encounter for immunization: Secondary | ICD-10-CM | POA: Diagnosis not present

## 2021-03-09 DIAGNOSIS — Z1231 Encounter for screening mammogram for malignant neoplasm of breast: Secondary | ICD-10-CM | POA: Diagnosis not present

## 2021-03-09 DIAGNOSIS — M810 Age-related osteoporosis without current pathological fracture: Secondary | ICD-10-CM | POA: Diagnosis not present

## 2021-03-09 DIAGNOSIS — Z780 Asymptomatic menopausal state: Secondary | ICD-10-CM | POA: Diagnosis not present

## 2021-03-09 DIAGNOSIS — M8589 Other specified disorders of bone density and structure, multiple sites: Secondary | ICD-10-CM | POA: Diagnosis not present

## 2021-03-09 LAB — HM MAMMOGRAPHY

## 2021-03-12 ENCOUNTER — Encounter: Payer: Self-pay | Admitting: Internal Medicine

## 2021-03-16 ENCOUNTER — Encounter: Payer: Self-pay | Admitting: Internal Medicine

## 2021-03-18 ENCOUNTER — Encounter: Payer: Self-pay | Admitting: Podiatry

## 2021-03-18 ENCOUNTER — Ambulatory Visit (INDEPENDENT_AMBULATORY_CARE_PROVIDER_SITE_OTHER): Payer: Medicare Other | Admitting: Podiatry

## 2021-03-18 ENCOUNTER — Other Ambulatory Visit: Payer: Self-pay

## 2021-03-18 ENCOUNTER — Ambulatory Visit: Payer: Medicare Other

## 2021-03-18 DIAGNOSIS — M2041 Other hammer toe(s) (acquired), right foot: Secondary | ICD-10-CM | POA: Diagnosis not present

## 2021-03-18 DIAGNOSIS — M21619 Bunion of unspecified foot: Secondary | ICD-10-CM

## 2021-03-18 DIAGNOSIS — B351 Tinea unguium: Secondary | ICD-10-CM | POA: Diagnosis not present

## 2021-03-20 NOTE — Progress Notes (Signed)
Subjective:   Patient ID: Meghan Oliver, female   DOB: 80 y.o.   MRN: [id number]   HPI Patient feels like she is developed more nail issues with discoloration over the last few years.  She does not have pain with these but does have some thickness and difficulty cutting and does get pedicures.  Patient also has moderate structural deformity with digital deformities moderate bunion deformity and does not smoke likes to be active   Review of Systems  All other systems reviewed and are negative.      Objective:  Physical Exam Vitals and nursing note reviewed.  Constitutional:      Appearance: She is well-developed.  Pulmonary:     Effort: Pulmonary effort is normal.  Musculoskeletal:        General: Normal range of motion.  Skin:    General: Skin is warm.  Neurological:     Mental Status: She is alert.    Neurovascular status intact muscle strength was found to be adequate with patient noted to have thick nail disease with yellow discoloration and abnormal thickness of the fourth right along with structural deformity and elevated digital deformities that have occurred over the years with sensible shoe gear being worn.  Good digital perfusion     Assessment:  Several different problems with 1 being mycotic nail infection along with probable trauma to the nailbeds digital deformity bunion deformity     Plan:  H&P reviewed all conditions and discussed structural deformity and nail deformity and due to lack of pain and sensible shoe gear do not recommend further treatment.  She is okay to get pedicures and can paint her nails and I did discuss again conservative care at this and will be seen back if either she should develop infection or other pathology which causes pain  X-rays indicate that there is structural deformity with bunion deformity and moderate rigid contracture lesser digits     Patient concerned about

## 2021-04-03 ENCOUNTER — Other Ambulatory Visit: Payer: Self-pay | Admitting: Internal Medicine

## 2021-04-11 ENCOUNTER — Encounter: Payer: Self-pay | Admitting: Internal Medicine

## 2021-04-19 ENCOUNTER — Other Ambulatory Visit: Payer: Self-pay | Admitting: Psychiatry

## 2021-05-13 ENCOUNTER — Encounter: Payer: Self-pay | Admitting: Psychiatry

## 2021-05-13 ENCOUNTER — Ambulatory Visit (INDEPENDENT_AMBULATORY_CARE_PROVIDER_SITE_OTHER): Payer: Medicare Other | Admitting: Psychiatry

## 2021-05-13 ENCOUNTER — Other Ambulatory Visit: Payer: Self-pay

## 2021-05-13 DIAGNOSIS — F3175 Bipolar disorder, in partial remission, most recent episode depressed: Secondary | ICD-10-CM

## 2021-05-13 DIAGNOSIS — Z79899 Other long term (current) drug therapy: Secondary | ICD-10-CM

## 2021-05-13 DIAGNOSIS — G3184 Mild cognitive impairment, so stated: Secondary | ICD-10-CM | POA: Diagnosis not present

## 2021-05-13 DIAGNOSIS — F411 Generalized anxiety disorder: Secondary | ICD-10-CM

## 2021-05-13 DIAGNOSIS — F061 Catatonic disorder due to known physiological condition: Secondary | ICD-10-CM | POA: Diagnosis not present

## 2021-05-13 DIAGNOSIS — G251 Drug-induced tremor: Secondary | ICD-10-CM | POA: Diagnosis not present

## 2021-05-13 NOTE — Progress Notes (Signed)
Meghan Oliver [id number] [birth year] 80 y.o.  Subjective:   Patient ID:  Meghan Oliver is a 80 y.o. (DOB [birth year]) female. Patient was last seen August 21, 2018 Chief Complaint:  Chief Complaint  Patient presents with   Follow-up   Depression   Anxiety   Medication Reaction    Meghan Oliver presents to the office today for follow-up of Severe TR bipolar depression with psychotic features including catatonia.  11/30/2018 was the last visit and the following was noted: Had ECT yesterday at [facility].  Last was Jan 8 and was doing well then.  Next scheduled April 15.  Had a lithium level from there which is pending. Pretty good until the last 10 days to 2 weeks with less energy and motivation.  Bothered by old sick dog.  Worried about how that will affect her.  Has slept with the dog.  Enjoyed [place].  Going to gym and playing [place].  Took the class and didn't feel she caught on quickly. Wonders if it is bc of the ECT memory effects.  Bridge is harder and not playing.  Planning to return to [facility] mid April.   spent the winter in [place] per usual..  No significant depression since here. ECT frequency about 6 weeks.  Gets very anxious the day of the ECT.  ECT consistently for a year.  Best in mood in 7-8 years.  Also the pacemaker helped.  Friends notice the benefit.  Plan:  Option increase in the nortriptyline if needed.  Considered this.  They agree and would like to increase nortriptyline to 75 to try to reduce tendency to depression before the ECT.   03/19/19  Addendum: Here the contents from an email from the nurse for [name]  Hello [name],   I hope you and your family are healthy and well. I took [name] to [facility] for ECT and here are labs from 01/26/19. I am going to also forward them to [facility] for follow up with her LFT's. They are still higher than I would like.  Her lithium level was 0.51 on a daily dose of '300mg'$  qohs x 4 nights- alternating with '450mg'$  qohs x 3 nights. She is  not demonstrating any signs of elevated levels, minimal hand tremors, less anxiety and restlessness since her ECT treatment.  Her Nortryptyline level was 140 on '75mg'$  qhs.  I would like to continue her current dosages of both the above meds and recheck her lithium level at her next scheduled ECT of 03/23/19.  Please let me know of any changes you would like to make. Take care, [name] RN Lithium level was 0.4 on the '450mg'$  alt with 300 mg QOD. No changes were made in meds.  11/15/2019 phone call: Telephone call from patient's husband [name] on 11/14/2019  Patient is scheduled for ECT soon for maintenance ECT.  He thinks it is been 2-1/2 months since the last ECT but he is going to check the date for sure.  It is still be done being done at [facility].  He is wondering about skipping this treatment because the patient has continued to be free of depression and seems cognitively clearer as these ECT treatments have been spread out further from each other.  Patient remains on medications as prescribed.  If it is truly been over 2 months since the last ECT then it is reasonable to consider discontinuing ECT.  It is unlikely that ECT with the frequency of greater than  2 months is significantly helpful at preventing her relapse.  If however the ECT frequency is less than every 2 months it could still be helping to prevent recurrence.  He indicated he would consider this information and discuss it with her ECT team and make a decision.  They are heading to [place] in the next few weeks.  Needs to schedule an appointment with me for follow-up because it is been many months.  He agrees.   02/19/2020 appt, the following noted: Moving to [place] in a few months.  Ready to downsize.   Stopped ECT as discussed and has not been more deprressed.   Walks dog 4 times daily.   Lithium 0.7 on  01/30/20 on lithium 300 mg plus 150 mg on M, W, F Nortriptyline 70 on 75 mg HS. No SE except tremor.  Balance is not  great.  Very happy.   No depression since here.  No mood swings.  Sleep and appetite is OK.  No med changes:  04/03/2020 TC with the following noted: Patient's husband called stating that her tremor was a little worse and she was having some stutter. No evidence of stroke otherwise. First thing would be to check serum lithium level and BMP.  Order written.  Have asked her husband to let us know about the lab to which it should be sent.  04/22/20 TC witht he following noted: Lithium level is not dangerously high but it is has crept up to 1.0 which is higher than desired for her.  Our goal for her is 0.5-0.7.  At the current level it is likely causing side effects so we should reduce the dosage from lithium 300 mg plus 150 mg on M, W, F  TO 300 mg daily.  Meaning drop off the 150 mg capsule.  It will take about 2 weeks for the level to gradually come down to the desired level.  05/08/2020 appt with the following noted: Seen with H and [name] nurse. Moving to [facility] and feels OK about it.  I think I'll be fine.   Pretty good with balance.  Doing yoga and it helps. [name] has cancer. Old dog 71 & 1/2 yo still living. [name]'s concerns when went to [place] for D's BD she had difficulty time with tremor lethargy, more confusion.  Better when go back home. [name] notes using more lorazepam. At times gait is unsteady. Not significantly depressed.  Can be anxious at times.  Eating okay.  Sleeping okay.  Is easily confused under stress. Plan: reduce lithium to 300 mg nightly.  07/14/20 appt with the following noted: Seen with her husband as well as her family nurse. Feeling fine and pleased with that.   Some anxiety over the move to smaller place. [name]'s twin also having heart problems Gene. [name]'s kids came and visited.    Plan: Continue nortriptyline 75 mg, paroxetine 20 mg, lithium 300 mg, Namenda 10 mg twice daily, lorazepam 0.5 mg 1/2-1 3 times daily as needed, mirtazapine 7.5 mg  nightly  02/27/2021 appointment with the following noted:  Seen with H and nurse [place] email from nurse [name] on 02/24/2021 indicated that he had moved into [facility] in May.  Riese has gradually become more depressed and more anxious using lorazepam as prescribed 3 times daily.  More memory issues.  She has remained engaged and initiating appropriate conversation.  She is very worried about her 26 year old dog who is in poor health and fears she will  become more depressed when the dog dies.  She and her husband do not wish to prefer to pursue further ECT unless absolutely necessary. Likes [facility] so far.   No depression by her report.  Sold big house in Feb.  It worked out well.   Worries about her dog 70 yo will die soon. H agrees she's done well with depression.   Enjoys things and has interests. Plan: Continue Lithium 300 mg daily  Continue nortriptyline 75 mg daily Continue paroxetine 20 mg daily Continue lorazepam 0.5 mg 3 times daily for both anxiety and catatonia Continue vitamin B6 and primidone for tremor Cerefolin NAC 2 daily.  05/13/2021 appointment with the following noted: Happy with [place].  H and she agree is doing well with depression.Kermit Balo socialization. She complains of memory.   Family visited and getting along with Baker Janus better.  H says Baker Janus is acting better.  Goes to dinner and activities together now.  Better relationship with Gene.. Tremor has been OK.   Ativan usually 0.25 mg TID and tolerated. Anxiety managed with this generally. Molly dog health more stable. To [place] before T'giving until April.     Patient reports stable mood and denies depressed or irritable moods.  Patient denies any recent difficulty with anxiety except as noted.  Patient denies difficulty with sleep initiation or maintenance. Denies appetite disturbance.    Patient has some difficulty with concentration.  Patient denies any suicidal ideation.  Usually takes  Ativan at night and sleeps well 8 hours.    No current alcohol problems  Saw Dr. Carles Collet last week and no change in neuro meds.  Past Psychiatric Medication Trials: Prozac with lithium and Zyprexa 2.5 to 5 mg, nortriptyline, paroxetine, ,  pramipexole, mirtazapine, Namenda, lorazepam,  Rexulti, Abilify, Vraylar, Latuda 10 mg, lamotrigine.   this loss list is not exhaustive  Review of Systems:  Review of Systems  Constitutional:  Negative for fatigue and unexpected weight change.  Neurological:  Negative for tremors and weakness.       Some balance issues  Psychiatric/Behavioral:  Positive for decreased concentration. Negative for agitation, behavioral problems, confusion, dysphoric mood, hallucinations, self-injury, sleep disturbance and suicidal ideas. The patient is nervous/anxious. The patient is not hyperactive.    Medications: I have reviewed the patient's current medications.  Current Outpatient Medications  Medication Sig Dispense Refill   ASPIRIN 81 PO Take by mouth.     Calcium Carbonate-Vitamin D (CALCIUM 600/VITAMIN D PO) Take 1 tablet by mouth 2 (two) times daily.     cetirizine (ZYRTEC) 10 MG tablet Take 10 mg by mouth daily as needed for allergies.     Cholecalciferol (VITAMIN D-3) 5000 units TABS Take 5,000 Units by mouth daily with breakfast.      cloNIDine (CATAPRES) 0.1 MG tablet TAKE 1 TABLET BY MOUTH DAILY AS NEEDED.( TAKE BEFORE ECT TREATMENTS) 10 tablet 1   clotrimazole-betamethasone (LOTRISONE) cream Apply 1 application topically 2 (two) times daily. 30 g 0   dexlansoprazole (DEXILANT) 60 MG capsule Take 1 capsule (60 mg total) by mouth daily. 90 capsule 3   fesoterodine (TOVIAZ) 8 MG TB24 tablet Take 1 tablet (8 mg total) by mouth daily. 90 tablet 3   irbesartan (AVAPRO) 150 MG tablet Take 150 mg by mouth daily.     Krill Oil 500 MG CAPS Take 1 capsule by mouth daily.     lactulose (CHRONULAC) 10 GM/15ML solution TAKE 15 ML BY MOUTH DAILY AS NEEDED FOR MILD  CONSTIPATION 1350 mL  1   Lavender Oil 80 MG CAPS Take 160 mg by mouth at bedtime.     levothyroxine (SYNTHROID) 75 MCG tablet TAKE 1 TABLET(75 MCG) BY MOUTH DAILY 90 tablet 0   lithium carbonate 300 MG capsule TAKE 1 CAPSULE(300 MG) BY MOUTH AT BEDTIME 90 capsule 1   LORazepam (ATIVAN) 0.5 MG tablet TAKE 1/2 TO 1 TABLET BY MOUTH EVERY NIGHT AT BEDTIME AND 1 TABLET EVERY 8 HOURS AS NEEDED FOR ANXIETY 180 tablet 1   memantine (NAMENDA) 10 MG tablet Take 1 tablet (10 mg total) by mouth 2 (two) times daily. 180 tablet 3   Methylfol-Algae-B12-Acetylcyst (CEREFOLIN NAC) 6-90.314-2-600 MG TABS TAKE 1 CAPLET BY MOUTH THREE TIMES A DAY (Patient taking differently: 2 (two) times daily.) 270 tablet 0   mirtazapine (REMERON) 7.5 MG tablet TAKE 1 TABLET(7.5 MG) BY MOUTH AT BEDTIME 90 tablet 3   Multiple Vitamins-Minerals (MULTIVITAMIN WITH MINERALS) tablet Take 1 tablet by mouth daily.     Multiple Vitamins-Minerals (PRESERVISION AREDS 2 PO) Take 1 capsule by mouth in the morning and at bedtime.     nortriptyline (PAMELOR) 25 MG capsule TAKE 3 CAPSULES(75 MG) BY MOUTH AT BEDTIME 270 capsule 3   nystatin-triamcinolone (MYCOLOG II) cream Apply 1 application topically 2 (two) times daily. (Patient taking differently: Apply 1 application topically as needed.) 30 g 0   Omega-3 Fatty Acids (OMEGA 3 PO) Take 1 capsule by mouth at bedtime.     OVER THE COUNTER MEDICATION Take 1 tablet by mouth 2 (two) times daily. *Cocavia*     PARoxetine (PAXIL) 20 MG tablet TAKE 1 TABLET(20 MG) BY MOUTH AT BEDTIME 90 tablet 3   polyethylene glycol (MIRALAX / GLYCOLAX) 17 g packet Take 17 g by mouth as needed.     primidone (MYSOLINE) 50 MG tablet TAKE 2 TABLETS EVERY MORNING AND 1 TABLET EVERY EVENING 270 tablet 0   Pyridoxine HCl (VITAMIN B-6) 500 MG tablet Take 500 mg by mouth 2 (two) times daily.     rosuvastatin (CRESTOR) 40 MG tablet Take 1 tablet (40 mg total) by mouth daily. MUST BE SEEN IN OFFICE FOR FURTHER REFILLS 90 tablet  3   sennosides-docusate sodium (SENOKOT-S) 8.6-50 MG tablet Take 2 tablets by mouth at bedtime.     sucralfate (CARAFATE) 1 GM/10ML suspension Take 10 mLs (1 g total) by mouth 4 (four) times daily -  with meals and at bedtime. 420 mL 1   valACYclovir (VALTREX) 1000 MG tablet Take 1 tablet by mouth as needed. Use as directed     vitamin B-12 (CYANOCOBALAMIN) 1000 MCG tablet Take 1,000 mcg by mouth at bedtime.     No current facility-administered medications for this visit.    Medication Side Effects: Other: tremor  stable.  No worse.  Allergies:  Allergies  Allergen Reactions   Propranolol Other (See Comments)    Low blood pressure   Brexpiprazole Other (See Comments)    Aphasia and catatonia    Past Medical History:  Diagnosis Date   Anxiety    Arthritis    "hips, spine" (03/17/2018)   Bipolar II disorder (HCC)    Chronic bronchitis (HCC)    Chronic lower back pain    Chronic right hip pain    CKD (chronic kidney disease), stage II    Esophagitis, erosive    GAD (generalized anxiety disorder)    GERD (gastroesophageal reflux disease)    Headache    "maybe monthly" (03/17/2018))   Heart murmur, systolic  History of adenomatous polyp of colon    08-04-2016  tubular adenoma   History of blood transfusion 12/2017   "related to vascular hematoma"   History of electroconvulsive therapy    at [place]--  started 04-15-2015 to 11-17-2016  total greater than 40 times   History of hiatal hernia    Hyperlipidemia    Hypertension    Hypothyroidism    Internal carotid artery stenosis, bilateral    per last duplex 05-01-2014  bilateral ICA 40-59%   Major depression, chronic    ECT treatments extensive and multiple started 07/ 2016   Memory loss    "both short and long-term; needs frequent reminders to follow instrucitons" (05/16/2017)   Migraines    "none in years" (03/17/2018)   OSA (obstructive sleep apnea)    per study 06/ 2012 moderate OSA  ; "refuses to wear masks" (03/17/2018)    Osteoporosis    Pulmonary nodule    monitored by pcp   S/P placement of cardiac pacemaker 03/17/18 [facility]  03/18/2018    Family History  Problem Relation Age of Onset   Heart attack Father 71       deceased   Hypertension Father    Heart disease Father    Breast cancer Paternal Grandmother        Age unknown   Breast cancer Paternal Aunt        Age 80's   Colon cancer Neg Hx     Social History   Socioeconomic History   Marital status: Married    Spouse name: Dr. Lyla Son   Number of children: 2   Years of education: Not on file   Highest education level: Not on file  Occupational History   Occupation: housewife    Employer: UNEMPLOYED  Tobacco Use   Smoking status: Former    Packs/day: 2.00    Years: 15.00    Pack years: 30.00    Types: Cigarettes    Quit date: 01/13/1971    Years since quitting: 50.3   Smokeless tobacco: Never  Vaping Use   Vaping Use: Never used  Substance and Sexual Activity   Alcohol use: Yes    Alcohol/week: 0.0 standard drinks    Comment: occassional   Drug use: Never   Sexual activity: Not Currently    Comment: intercourse age 56, sexual partners less than 5  Other Topics Concern   Not on file  Social History Narrative   Not on file   Social Determinants of Health   Financial Resource Strain: Not on file  Food Insecurity: Not on file  Transportation Needs: Not on file  Physical Activity: Not on file  Stress: Not on file  Social Connections: Not on file  Intimate Partner Violence: Not on file    Past Medical History, Surgical history, Social history, and Family history were reviewed and updated as appropriate.   Please see review of systems for further details on the patient's review from today.   Objective:   Physical Exam:  LMP  (LMP Unknown)   Physical Exam Constitutional:      General: She is not in acute distress.    Appearance: She is well-developed.  Musculoskeletal:        General: No deformity.   Neurological:     Mental Status: She is alert and oriented to person, place, and time.     Motor: No tremor.     Coordination: Coordination abnormal.  Psychiatric:  Attention and Perception: She is attentive.        Mood and Affect: Mood is anxious. Mood is not depressed. Affect is not labile, blunt, angry or inappropriate.        Speech: Speech is not rapid and pressured or slurred.        Behavior: Behavior normal. Behavior is not slowed or withdrawn.        Thought Content: Thought content normal. Thought content does not include homicidal or suicidal ideation. Thought content does not include homicidal or suicidal plan.        Cognition and Memory: She exhibits impaired recent memory.        Judgment: Judgment normal.     Comments: Insight intact. No auditory or visual hallucinations.  meticulous appearance bright affect    Lab Review:     Component Value Date/Time   NA 138 07/10/2020 1021   NA 136 04/20/2017 1151   K 4.5 07/10/2020 1021   CL 104 07/10/2020 1021   CO2 24 07/10/2020 1021   GLUCOSE 123 (H) 07/10/2020 1021   BUN 25 07/10/2020 1021   BUN 24 04/20/2017 1151   CREATININE 1.05 (H) 07/10/2020 1021   CALCIUM 9.6 07/10/2020 1021   PROT 6.6 02/20/2021 1100   PROT 6.2 04/20/2017 1151   ALBUMIN 3.9 08/08/2017 1615   ALBUMIN 3.9 04/20/2017 1151   AST 65 (H) 02/20/2021 1100   ALT 98 (H) 02/20/2021 1100   ALKPHOS 46 08/08/2017 1615   BILITOT 0.3 02/20/2021 1100   BILITOT <0.2 04/20/2017 1151   GFRNONAA 50 (L) 07/10/2020 1021   GFRAA 58 (L) 07/10/2020 1021       Component Value Date/Time   WBC 6.3 05/15/2019 1215   RBC 4.15 05/15/2019 1215   HGB 13.7 05/15/2019 1215   HGB 10.2 (L) 04/20/2017 1151   HCT 40.8 05/15/2019 1215   HCT 29.6 (L) 04/20/2017 1151   PLT 243 05/15/2019 1215   PLT 385 (H) 04/20/2017 1151   MCV 98.3 05/15/2019 1215   MCV 89 04/20/2017 1151   MCH 33.0 05/15/2019 1215   MCHC 33.6 05/15/2019 1215   RDW 12.0 05/15/2019 1215   RDW  13.9 04/20/2017 1151   LYMPHSABS 1,764 05/15/2019 1215   LYMPHSABS 1.5 04/20/2017 1151   MONOABS 0.5 03/09/2017 1232   EOSABS 82 05/15/2019 1215   EOSABS 0.3 04/20/2017 1151   BASOSABS 38 05/15/2019 1215   BASOSABS 0.0 04/20/2017 1151    Lithium Lvl  Date Value Ref Range Status  02/20/2021 0.6 0.6 - 1.2 mmol/L Final  Nortriptyline level 51 on 50 mg nightly with paroxetine 20 mg daily  07/10/2020 lithium level 0.6 at [facility] office and nortriptyline level 130 on the current dosages of lithium 300 mg nightly and nortriptyline 75 mg nightly  02/20/2021 labs lithium 0.6 on 300 mg nightly.  Nortriptyline level 49 which is lower than expected on 75 mg nightly along with paroxetine 20 mg daily.  No results found for: PHENYTOIN, PHENOBARB, VALPROATE, CBMZ   .res Assessment: Plan:    Bipolar I disorder, mild, current or most recent episode depressed, in partial remission, with catatonia ([place])  Generalized anxiety disorder  Catatonia  Mild cognitive impairment  Lithium-induced tremor  Lithium use    History of lithium toxicity repeatedly.  Greater than 50% of 50 min face to face time with patient her nurse and her husband was spent on counseling and coordination of care.  Her case has been highly complex and treatment resistant at times.  Her severe treatment resistant bipolar depression is currently in remission.  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  During the severe depression she has catatonia.  She had required longterm ECT.  A previous episode of depression approximately 10 years ago stayed in remission on Paxil and nortriptyline and lithium for a period of years until the lithium level was decreased due to tremor.  Now we are maintaining an adequate lithium level.  She has had several episodes of lithium toxicity with hospitalization.    From records it appears her last ECT was in September 2020.  Her depression has remained  in remission since that time.  Depression and anxiety much improved.     She seems to be tolerating the meds well other than the lithium tremor which is being fairly  managed with primidone.  She is continuing under the care of her neurologist Dr. Carles Collet.  The nortriptyline was increased to 75 to try to reduce tendency to depression in February 2020.  Levels are noted.  02/20/2021 labs lithium 0.6 on 300 mg nightly.   Nortriptyline level 49 which is lower than expected on 75 mg nightly along with paroxetine 20 mg daily. No med changes as result of these labs. Check labs before trip to [facility]  Disc the frequency of lithium labs needs to be at   Lithium level goals are between 0.5 and 0.7.  She has a history of lithium toxicity when she was taking chlorthalidone with the lithium.   Discussed signs and symptoms of lithium toxicity.  Discussed the risk of increasing depression if the lithium level gets too low.  Lithium tremor is overall little better with the reduction in lithium. She cannot tolerate higher doses of lithium.  She is concerned that her memory has been affected by long-term ECT.  There remains some concern about drug interactions between paroxetine and nortriptyline and given her age we do not want the nortriptyline levels higher than necessary .  However the nortriptyline level was not high.  Discussed the difficulties they have with travel.  She can get confused with new environments and excessive stimulation.  She does better with routine. Overall memory appears to be stable and she has reasonable recall for recent family events.  Continue Lithium 300 mg daily  Continue nortriptyline 75 mg daily Continue paroxetine 20 mg daily Continue lorazepam 0.5 mg 3 times daily for both anxiety and catatonia Continue vitamin B6 and primidone for tremor  This appointment was 50 minutes.   Follow-up 3 months  Lynder Parents MD, DFAPA Please see After Visit Summary for patient specific  instructions.  Future Appointments  Date Time Provider [place]  08/13/2021  2:30 PM Cottle, Billey Co., MD CP-CP None     No orders of the defined types were placed in this encounter.     -------------------------------

## 2021-05-16 ENCOUNTER — Other Ambulatory Visit: Payer: Self-pay | Admitting: Psychiatry

## 2021-05-27 ENCOUNTER — Other Ambulatory Visit: Payer: Self-pay | Admitting: Psychiatry

## 2021-06-03 DIAGNOSIS — I872 Venous insufficiency (chronic) (peripheral): Secondary | ICD-10-CM | POA: Diagnosis not present

## 2021-06-03 DIAGNOSIS — L821 Other seborrheic keratosis: Secondary | ICD-10-CM | POA: Diagnosis not present

## 2021-06-03 DIAGNOSIS — L720 Epidermal cyst: Secondary | ICD-10-CM | POA: Diagnosis not present

## 2021-06-03 DIAGNOSIS — Z86018 Personal history of other benign neoplasm: Secondary | ICD-10-CM | POA: Diagnosis not present

## 2021-06-03 DIAGNOSIS — L578 Other skin changes due to chronic exposure to nonionizing radiation: Secondary | ICD-10-CM | POA: Diagnosis not present

## 2021-06-03 DIAGNOSIS — S40862A Insect bite (nonvenomous) of left upper arm, initial encounter: Secondary | ICD-10-CM | POA: Diagnosis not present

## 2021-06-03 DIAGNOSIS — L814 Other melanin hyperpigmentation: Secondary | ICD-10-CM | POA: Diagnosis not present

## 2021-06-03 DIAGNOSIS — D225 Melanocytic nevi of trunk: Secondary | ICD-10-CM | POA: Diagnosis not present

## 2021-06-03 DIAGNOSIS — W57XXXA Bitten or stung by nonvenomous insect and other nonvenomous arthropods, initial encounter: Secondary | ICD-10-CM | POA: Diagnosis not present

## 2021-06-10 ENCOUNTER — Other Ambulatory Visit: Payer: Self-pay | Admitting: Psychiatry

## 2021-06-11 NOTE — Telephone Encounter (Signed)
Last filled 5/25

## 2021-06-26 ENCOUNTER — Other Ambulatory Visit: Payer: Self-pay | Admitting: Internal Medicine

## 2021-07-09 ENCOUNTER — Other Ambulatory Visit: Payer: Self-pay | Admitting: Psychiatry

## 2021-07-23 ENCOUNTER — Other Ambulatory Visit: Payer: Self-pay | Admitting: Psychiatry

## 2021-08-03 DIAGNOSIS — Z961 Presence of intraocular lens: Secondary | ICD-10-CM | POA: Diagnosis not present

## 2021-08-03 DIAGNOSIS — H35721 Serous detachment of retinal pigment epithelium, right eye: Secondary | ICD-10-CM | POA: Diagnosis not present

## 2021-08-03 DIAGNOSIS — H353132 Nonexudative age-related macular degeneration, bilateral, intermediate dry stage: Secondary | ICD-10-CM | POA: Diagnosis not present

## 2021-08-03 DIAGNOSIS — H31092 Other chorioretinal scars, left eye: Secondary | ICD-10-CM | POA: Diagnosis not present

## 2021-08-03 DIAGNOSIS — H35363 Drusen (degenerative) of macula, bilateral: Secondary | ICD-10-CM | POA: Diagnosis not present

## 2021-08-03 DIAGNOSIS — H35453 Secondary pigmentary degeneration, bilateral: Secondary | ICD-10-CM | POA: Diagnosis not present

## 2021-08-10 ENCOUNTER — Other Ambulatory Visit (HOSPITAL_BASED_OUTPATIENT_CLINIC_OR_DEPARTMENT_OTHER): Payer: Self-pay

## 2021-08-10 MED ORDER — AMOXICILLIN 875 MG PO TABS
ORAL_TABLET | ORAL | 1 refills | Status: DC
  Filled 2021-08-10: qty 20, 10d supply, fill #0
  Filled 2021-08-17: qty 20, 10d supply, fill #1

## 2021-08-13 ENCOUNTER — Other Ambulatory Visit (HOSPITAL_BASED_OUTPATIENT_CLINIC_OR_DEPARTMENT_OTHER): Payer: Self-pay

## 2021-08-13 ENCOUNTER — Ambulatory Visit (INDEPENDENT_AMBULATORY_CARE_PROVIDER_SITE_OTHER): Payer: Medicare Other | Admitting: Psychiatry

## 2021-08-13 ENCOUNTER — Other Ambulatory Visit: Payer: Self-pay

## 2021-08-13 DIAGNOSIS — R7989 Other specified abnormal findings of blood chemistry: Secondary | ICD-10-CM | POA: Diagnosis not present

## 2021-08-13 DIAGNOSIS — F3181 Bipolar II disorder: Secondary | ICD-10-CM

## 2021-08-13 DIAGNOSIS — F061 Catatonic disorder due to known physiological condition: Secondary | ICD-10-CM

## 2021-08-13 DIAGNOSIS — Z79899 Other long term (current) drug therapy: Secondary | ICD-10-CM | POA: Diagnosis not present

## 2021-08-13 DIAGNOSIS — F3175 Bipolar disorder, in partial remission, most recent episode depressed: Secondary | ICD-10-CM | POA: Diagnosis not present

## 2021-08-13 DIAGNOSIS — F411 Generalized anxiety disorder: Secondary | ICD-10-CM

## 2021-08-13 MED ORDER — TRAMADOL HCL 50 MG PO TABS
ORAL_TABLET | ORAL | 0 refills | Status: DC
  Filled 2021-08-13: qty 15, 4d supply, fill #0

## 2021-08-13 NOTE — Progress Notes (Signed)
Meghan Oliver [id number] 06/28/1941 80 y.o.  Subjective:   Patient ID:  Meghan Oliver is a 80 y.o. (DOB 10-06-1940) female. Patient was last seen August 21, 2018 Chief Complaint:  Chief Complaint  Patient presents with   Follow-up   Depression   Anxiety   Memory Loss     Meghan Oliver presents to the office today for follow-up of Severe TR bipolar depression with psychotic features including catatonia.  11/30/2018 was the last visit and the following was noted: Had ECT yesterday at [facility].  Last was Jan 8 and was doing well then.  Next scheduled April 15.  Had a lithium level from there which is pending. Pretty good until the last 10 days to 2 weeks with less energy and motivation.  Bothered by old sick dog.  Worried about how that will affect her.  Has slept with the dog.  Enjoyed [place].  Going to gym and playing [facility].  Took the class and didn't feel she caught on quickly. Wonders if it is bc of the ECT memory effects.  Bridge is harder and not playing.  Planning to return to [facility] mid April.   spent the winter in [place] per usual..  No significant depression since here. ECT frequency about 6 weeks.  Gets very anxious the day of the ECT.  ECT consistently for a year.  Best in mood in 7-8 years.  Also the pacemaker helped.  Friends notice the benefit.  Plan:  Option increase in the nortriptyline if needed.  Considered this.  They agree and would like to increase nortriptyline to 75 to try to reduce tendency to depression before the ECT.   03/19/19  Addendum: Here the contents from an email from the nurse for [facility]  Hello Dr. Clovis Oliver,   I hope you and your family are healthy and well. I took [facility] to [facility] for ECT and here are labs from 01/26/19. I am going to also forward them to [facility] for follow up with her LFT's. They are still higher than I would like.  Her lithium level was 0.51 on a daily dose of 300mg  qohs x 4 nights- alternating with 450mg  qohs x 3 nights. She is not  demonstrating any signs of elevated levels, minimal hand tremors, less anxiety and restlessness since her ECT treatment.  Her Nortryptyline level was 140 on 75mg  qhs.  I would like to continue her current dosages of both the above meds and recheck her lithium level at her next scheduled ECT of 03/23/19.  Please let me know of any changes you would like to make. Take care, Meghan Quan RN Lithium level was 0.4 on the 450mg  alt with 300 mg QOD. No changes were made in meds.  11/15/2019 phone call: Telephone call from patient's husband [facility] on 11/14/2019  Patient is scheduled for ECT soon for maintenance ECT.  He thinks it is been 2-1/2 months since the last ECT but he is going to check the date for sure.  It is still be done being done at [facility].  He is wondering about skipping this treatment because the patient has continued to be free of depression and seems cognitively clearer as these ECT treatments have been spread out further from each other.  Patient remains on medications as prescribed.  If it is truly been over 2 months since the last ECT then it is reasonable to consider discontinuing ECT.  It is unlikely that ECT with the frequency of greater  than 2 months is significantly helpful at preventing her relapse.  If however the ECT frequency is less than every 2 months it could still be helping to prevent recurrence.  He indicated he would consider this information and discuss it with her ECT team and make a decision.  They are heading to [place] in the next few weeks.  Needs to schedule an appointment with me for follow-up because it is been many months.  He agrees.   02/19/2020 appt, the following noted: Moving to [place] in a few months.  Ready to downsize.   Stopped ECT as discussed and has not been more deprressed.   Walks dog 4 times daily.   Lithium 0.7 on  01/30/20 on lithium 300 mg plus 150 mg on M, W, F Nortriptyline 70 on 75 mg HS. No SE except tremor.  Balance is not  great.  Very happy.   No depression since here.  No mood swings.  Sleep and appetite is OK.  No med changes:  04/03/2020 TC with the following noted: Patient's husband called stating that her tremor was a little worse and she was having some stutter. No evidence of stroke otherwise. First thing would be to check serum lithium level and BMP.  Order written.  Have asked her husband to let us know about the lab to which it should be sent.  04/22/20 TC witht he following noted: Lithium level is not dangerously high but it is has crept up to 1.0 which is higher than desired for her.  Our goal for her is 0.5-0.7.  At the current level it is likely causing side effects so we should reduce the dosage from lithium 300 mg plus 150 mg on M, W, F  TO 300 mg daily.  Meaning drop off the 150 mg capsule.  It will take about 2 weeks for the level to gradually come down to the desired level.  05/08/2020 appt with the following noted: Seen with H and [facility] nurse. Moving to [facility] and feels OK about it.  I think I'll be fine.   Pretty good with balance.  Doing yoga and it helps. [facility] has cancer. Old dog 35 & 1/2 yo still living. [facility]'s concerns when went to [place] for D's BD she had difficulty time with tremor lethargy, more confusion.  Better when go back home. [facility] notes using more lorazepam. At times gait is unsteady. Not significantly depressed.  Can be anxious at times.  Eating okay.  Sleeping okay.  Is easily confused under stress. Plan: reduce lithium to 300 mg nightly.  07/14/20 appt with the following noted: Seen with her husband as well as her family nurse. Feeling fine and pleased with that.   Some anxiety over the move to smaller place. Meghan Oliver's twin also having heart problems [facility]. [facility] Oliver's kids came and visited.    Plan: Continue nortriptyline 75 mg, paroxetine 20 mg, lithium 300 mg, Namenda 10 mg twice daily, lorazepam 0.5 mg 1/2-1 3 times daily as needed, mirtazapine 7.5 mg  nightly  02/27/2021 appointment with the following noted:  Seen with H and nurse [place] email from nurse [facility] on 02/24/2021 indicated that he had moved into [facility] in May.  Ashana has gradually become more depressed and more anxious using lorazepam as prescribed 3 times daily.  More memory issues.  She has remained engaged and initiating appropriate conversation.  She is very worried about her 65 year old dog who is in poor health and fears she  will become more depressed when the dog dies.  She and her husband do not wish to prefer to pursue further ECT unless absolutely necessary. Likes [facility] so far.   No depression by her report.  Sold big house in Feb.  It worked out well.   Worries about her dog 53 yo will die soon. H agrees she's done well with depression.   Enjoys things and has interests. Plan: Continue Lithium 300 mg daily  Continue nortriptyline 75 mg daily Continue paroxetine 20 mg daily Continue lorazepam 0.5 mg 3 times daily for both anxiety and catatonia Continue vitamin B6 and primidone for tremor Cerefolin NAC 2 daily.  05/13/2021 appointment with the following noted: Happy with [place].  H and she agree is doing well with depression.Kermit Balo socialization. She complains of memory.   Family visited and getting along with Baker Janus better.  H says Baker Janus is acting better.  Goes to dinner and activities together now.  Better relationship with [facility].. Tremor has been OK.   Ativan usually 0.25 mg TID and tolerated. Anxiety managed with this generally. Molly dog health more stable. To [place] before T'giving until April.   Plan: Continue Lithium 300 mg daily  Continue nortriptyline 75 mg daily Continue paroxetine 20 mg daily Continue lorazepam 0.5 mg 3 times daily for both anxiety and catatonia Continue vitamin B6 and primidone for tremor   08/14/2021 appointment with the following noted: Seen with her husband [facility] and nurse [facility]. Everyone reports that  her mood has been stable.  Her anxiety is manageable with as needed lorazepam.  She is still happy with the transition to [facility].  They are preparing to go to [place] for the winter per usual but may sell their house father there. Tolerating meds without any unusual side effects. Is dealing with grief over the loss of her dog Molly.  Patient reports stable mood and denies depressed or irritable moods.  Patient denies any recent difficulty with anxiety except as noted.  Patient denies difficulty with sleep initiation or maintenance. Denies appetite disturbance.    Patient has some difficulty with concentration.  Patient denies any suicidal ideation.  Usually takes Ativan at night and sleeps well 8 hours.    No current alcohol problems  Saw Dr. Carles Collet last week and no change in neuro meds.  Past Psychiatric Medication Trials: Prozac with lithium and Zyprexa 2.5 to 5 mg, nortriptyline, paroxetine, ,  pramipexole, mirtazapine, Namenda, lorazepam,  Rexulti, Abilify, Vraylar, Latuda 10 mg, lamotrigine.   this loss list is not exhaustive  Review of Systems:  Review of Systems  Constitutional:  Negative for fatigue and unexpected weight change.  Cardiovascular:  Negative for palpitations.  Neurological:  Negative for tremors and weakness.       Some balance issues  Psychiatric/Behavioral:  Positive for decreased concentration. Negative for agitation, behavioral problems, confusion, dysphoric mood, hallucinations, self-injury, sleep disturbance and suicidal ideas. The patient is nervous/anxious. The patient is not hyperactive.    Medications: I have reviewed the patient's current medications.  Current Outpatient Medications  Medication Sig Dispense Refill   amoxicillin (AMOXIL) 875 MG tablet Take 1 tablet by mouth every 12 hours. 20 tablet 1   ASPIRIN 81 PO Take by mouth.     Calcium Carbonate-Vitamin D (CALCIUM 600/VITAMIN D PO) Take 1 tablet by mouth 2 (two) times daily.     cetirizine  (ZYRTEC) 10 MG tablet Take 10 mg by mouth daily as needed for allergies.     Cholecalciferol (VITAMIN D-3)  5000 units TABS Take 5,000 Units by mouth daily with breakfast.      cloNIDine (CATAPRES) 0.1 MG tablet TAKE 1 TABLET BY MOUTH DAILY AS NEEDED.( TAKE BEFORE ECT TREATMENTS) 10 tablet 1   clotrimazole-betamethasone (LOTRISONE) cream Apply 1 application topically 2 (two) times daily. 30 g 0   dexlansoprazole (DEXILANT) 60 MG capsule Take 1 capsule (60 mg total) by mouth daily. 90 capsule 3   fesoterodine (TOVIAZ) 8 MG TB24 tablet Take 1 tablet (8 mg total) by mouth daily. 90 tablet 3   irbesartan (AVAPRO) 150 MG tablet Take 150 mg by mouth daily.     Krill Oil 500 MG CAPS Take 1 capsule by mouth daily.     lactulose (CHRONULAC) 10 GM/15ML solution TAKE 15 ML BY MOUTH DAILY AS NEEDED FOR MILD CONSTIPATION 1350 mL 1   Lavender Oil 80 MG CAPS Take 160 mg by mouth at bedtime.     levothyroxine (SYNTHROID) 75 MCG tablet TAKE 1 TABLET(75 MCG) BY MOUTH DAILY 90 tablet 2   lithium carbonate 300 MG capsule TAKE 1 CAPSULE(300 MG) BY MOUTH AT BEDTIME 90 capsule 0   LORazepam (ATIVAN) 0.5 MG tablet TAKE 1/2 TO 1 TABLET BY MOUTH EVERY NIGHT AT BEDTIME AND 1 TABLET EVERY 8 HOURS AS NEEDED FOR ANXIETY 180 tablet 1   memantine (NAMENDA) 10 MG tablet TAKE ONE TABLET BY MOUTH TWICE DAILY 180 tablet 0   Methylfol-Algae-B12-Acetylcyst (CEREFOLIN NAC) 6-90.314-2-600 MG TABS TAKE 1 CAPLET BY MOUTH THREE TIMES A DAY (Patient taking differently: 2 (two) times daily.) 270 tablet 0   mirtazapine (REMERON) 7.5 MG tablet TAKE 1 TABLET(7.5 MG) BY MOUTH AT BEDTIME 90 tablet 3   Multiple Vitamins-Minerals (MULTIVITAMIN WITH MINERALS) tablet Take 1 tablet by mouth daily.     Multiple Vitamins-Minerals (PRESERVISION AREDS 2 PO) Take 1 capsule by mouth in the morning and at bedtime.     nortriptyline (PAMELOR) 25 MG capsule TAKE 3 CAPSULES(75 MG) BY MOUTH AT BEDTIME 270 capsule 0   nystatin-triamcinolone (MYCOLOG II) cream  Apply 1 application topically 2 (two) times daily. (Patient taking differently: Apply 1 application topically as needed.) 30 g 0   Omega-3 Fatty Acids (OMEGA 3 PO) Take 1 capsule by mouth at bedtime.     OVER THE COUNTER MEDICATION Take 1 tablet by mouth 2 (two) times daily. *Cocavia*     PARoxetine (PAXIL) 20 MG tablet TAKE 1 TABLET(20 MG) BY MOUTH AT BEDTIME 90 tablet 1   polyethylene glycol (MIRALAX / GLYCOLAX) 17 g packet Take 17 g by mouth as needed.     primidone (MYSOLINE) 50 MG tablet TAKE 2 TABLETS BY MOUTH EVERY MORNING, AND 1 TABLET EVERY EVENING 270 tablet 0   Pyridoxine HCl (VITAMIN B-6) 500 MG tablet Take 500 mg by mouth 2 (two) times daily.     rosuvastatin (CRESTOR) 40 MG tablet Take 1 tablet (40 mg total) by mouth daily. MUST BE SEEN IN OFFICE FOR FURTHER REFILLS 90 tablet 3   sennosides-docusate sodium (SENOKOT-S) 8.6-50 MG tablet Take 2 tablets by mouth at bedtime.     sucralfate (CARAFATE) 1 GM/10ML suspension Take 10 mLs (1 g total) by mouth 4 (four) times daily -  with meals and at bedtime. 420 mL 1   traMADol (ULTRAM) 50 MG tablet TAKE 1 TABLET EVERY 4 TO 6 HOURS AS NEEDED FOR PAIN. 15 tablet 0   valACYclovir (VALTREX) 1000 MG tablet Take 1 tablet by mouth as needed. Use as directed     vitamin B-12 (  CYANOCOBALAMIN) 1000 MCG tablet Take 1,000 mcg by mouth at bedtime.     No current facility-administered medications for this visit.    Medication Side Effects: Other: tremor  stable.  No worse.  Allergies:  Allergies  Allergen Reactions   Propranolol Other (See Comments)    Low blood pressure   Brexpiprazole Other (See Comments)    Aphasia and catatonia    Past Medical History:  Diagnosis Date   Anxiety    Arthritis    "hips, spine" (03/17/2018)   Bipolar II disorder (HCC)    Chronic bronchitis (HCC)    Chronic lower back pain    Chronic right hip pain    CKD (chronic kidney disease), stage II    Esophagitis, erosive    GAD (generalized anxiety disorder)     GERD (gastroesophageal reflux disease)    Headache    "maybe monthly" (03/17/2018))   Heart murmur, systolic    History of adenomatous polyp of colon    08-04-2016  tubular adenoma   History of blood transfusion 12/2017   "related to vascular hematoma"   History of electroconvulsive therapy    at [facility]--  started 04-15-2015 to 11-17-2016  total greater than 40 times   History of hiatal hernia    Hyperlipidemia    Hypertension    Hypothyroidism    Internal carotid artery stenosis, bilateral    per last duplex 05-01-2014  bilateral ICA 40-59%   Major depression, chronic    ECT treatments extensive and multiple started 07/ 2016   Memory loss    "both short and long-term; needs frequent reminders to follow instrucitons" (05/16/2017)   Migraines    "none in years" (03/17/2018)   OSA (obstructive sleep apnea)    per study 06/ 2012 moderate OSA  ; "refuses to wear masks" (03/17/2018)   Osteoporosis    Pulmonary nodule    monitored by pcp   S/P placement of cardiac pacemaker 03/17/18 [facility]  03/18/2018    Family History  Problem Relation Age of Onset   Heart attack Father 33       deceased   Hypertension Father    Heart disease Father    Breast cancer Paternal Grandmother        Age unknown   Breast cancer Paternal Aunt        Age 36's   Colon cancer Neg Hx     Social History   Socioeconomic History   Marital status: Married    Spouse name: Dr. Lyla Son   Number of children: 2   Years of education: Not on file   Highest education level: Not on file  Occupational History   Occupation: housewife    Employer: UNEMPLOYED  Tobacco Use   Smoking status: Former    Packs/day: 2.00    Years: 15.00    Pack years: 30.00    Types: Cigarettes    Quit date: 01/13/1971    Years since quitting: 50.6   Smokeless tobacco: Never  Vaping Use   Vaping Use: Never used  Substance and Sexual Activity   Alcohol use: Yes    Alcohol/week: 0.0 standard drinks    Comment: occassional    Drug use: Never   Sexual activity: Not Currently    Comment: intercourse age 41, sexual partners less than 5  Other Topics Concern   Not on file  Social History Narrative   Not on file   Social Determinants of Health   Financial Resource Strain: Not on file  Food Insecurity: Not on file  Transportation Needs: Not on file  Physical Activity: Not on file  Stress: Not on file  Social Connections: Not on file  Intimate Partner Violence: Not on file    Past Medical History, Surgical history, Social history, and Family history were reviewed and updated as appropriate.   Please see review of systems for further details on the patient's review from today.   Objective:   Physical Exam:  LMP  (LMP Unknown)   Physical Exam Constitutional:      General: She is not in acute distress.    Appearance: She is well-developed.  Musculoskeletal:        General: No deformity.  Neurological:     Mental Status: She is alert and oriented to person, place, and time.     Motor: No tremor.     Coordination: Coordination abnormal.  Psychiatric:        Attention and Perception: She is attentive.        Mood and Affect: Mood is anxious. Mood is not depressed. Affect is not labile, blunt, angry or inappropriate.        Speech: Speech is not rapid and pressured or slurred.        Behavior: Behavior normal. Behavior is not slowed or withdrawn.        Thought Content: Thought content normal. Thought content does not include homicidal or suicidal ideation. Thought content does not include homicidal or suicidal plan.        Cognition and Memory: She exhibits impaired recent memory.        Judgment: Judgment normal.     Comments: Insight intact. No auditory or visual hallucinations.  meticulous appearance bright affect Mood and cognitive status are stable.    Lab Review:     Component Value Date/Time   NA 138 07/10/2020 1021   NA 136 04/20/2017 1151   K 4.5 07/10/2020 1021   CL 104 07/10/2020  1021   CO2 24 07/10/2020 1021   GLUCOSE 123 (H) 07/10/2020 1021   BUN 25 07/10/2020 1021   BUN 24 04/20/2017 1151   CREATININE 1.05 (H) 07/10/2020 1021   CALCIUM 9.6 07/10/2020 1021   PROT 6.6 02/20/2021 1100   PROT 6.2 04/20/2017 1151   ALBUMIN 3.9 08/08/2017 1615   ALBUMIN 3.9 04/20/2017 1151   AST 65 (H) 02/20/2021 1100   ALT 98 (H) 02/20/2021 1100   ALKPHOS 46 08/08/2017 1615   BILITOT 0.3 02/20/2021 1100   BILITOT <0.2 04/20/2017 1151   GFRNONAA 50 (L) 07/10/2020 1021   GFRAA 58 (L) 07/10/2020 1021       Component Value Date/Time   WBC 6.3 05/15/2019 1215   RBC 4.15 05/15/2019 1215   HGB 13.7 05/15/2019 1215   HGB 10.2 (L) 04/20/2017 1151   HCT 40.8 05/15/2019 1215   HCT 29.6 (L) 04/20/2017 1151   PLT 243 05/15/2019 1215   PLT 385 (H) 04/20/2017 1151   MCV 98.3 05/15/2019 1215   MCV 89 04/20/2017 1151   MCH 33.0 05/15/2019 1215   MCHC 33.6 05/15/2019 1215   RDW 12.0 05/15/2019 1215   RDW 13.9 04/20/2017 1151   LYMPHSABS 1,764 05/15/2019 1215   LYMPHSABS 1.5 04/20/2017 1151   MONOABS 0.5 03/09/2017 1232   EOSABS 82 05/15/2019 1215   EOSABS 0.3 04/20/2017 1151   BASOSABS 38 05/15/2019 1215   BASOSABS 0.0 04/20/2017 1151    Lithium Lvl  Date Value Ref Range Status  02/20/2021 0.6 0.6 - 1.2  mmol/L Final  Nortriptyline level 51 on 50 mg nightly with paroxetine 20 mg daily  07/10/2020 lithium level 0.6 at [facility] office and nortriptyline level 130 on the current dosages of lithium 300 mg nightly and nortriptyline 75 mg nightly  02/20/2021 labs lithium 0.6 on 300 mg nightly.  Nortriptyline level 49 which is lower than expected on 75 mg nightly along with paroxetine 20 mg daily.  No results found for: PHENYTOIN, PHENOBARB, VALPROATE, CBMZ   .res Assessment: Plan:    Bipolar II disorder ([place]) - Plan: Lithium level, TSH, Basic metabolic panel, Nortriptyline level [[facility]]  Bipolar I disorder, mild, current or most recent episode depressed, in partial  remission, with catatonia ([place])  Generalized anxiety disorder - Plan: Nortriptyline level [[facility]]  Lithium use - Plan: Lithium level, TSH, Basic metabolic panel  Low vitamin D level    History of lithium toxicity repeatedly.  Greater than 50% of 50 min face to face time with patient her nurse and her husband was spent on counseling and coordination of care.  Her case has been highly complex and treatment resistant at times.  Her severe treatment resistant bipolar depression is currently in remission.  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  During the severe depression she has catatonia.  She had required longterm ECT.  A previous episode of depression approximately 10 years ago stayed in remission on Paxil and nortriptyline and lithium for a period of years until the lithium level was decreased due to tremor.  Now we are maintaining an adequate lithium level.  She has had several episodes of lithium toxicity with hospitalization.    From records it appears her last ECT was in September 2020.  Her depression has remained in remission since that time.  Depression and anxiety much improved.     She seems to be tolerating the meds well other than the lithium tremor which is being fairly  managed with primidone.  She is continuing under the care of her neurologist Dr. Carles Collet.  The nortriptyline was increased to 75 to try to reduce tendency to depression in February 2020.  Levels are noted.  02/20/2021 labs lithium 0.6 on 300 mg nightly.  Nortriptyline level 49 which is lower than expected on 75 mg nightly along with paroxetine 20 mg daily.   Check labs before trip to [facility]  Disc the frequency of lithium labs. Labs include lithium level, nortriptyline level, serum BMP, TSH, and recommend vitamin D level.  Low vitamin D can be associated with cognitive problems and more risk of depression.  Lithium level goals are between 0.5 and 0.7.  She has a history of  lithium toxicity when she was taking chlorthalidone with the lithium.   Discussed signs and symptoms of lithium toxicity.  Discussed the risk of increasing depression if the lithium level gets too low.  Lithium tremor is overall little better with the reduction in lithium. She cannot tolerate higher doses of lithium.  Memory is stable compared with prior appointments.  No new concerns expressed by her family or her nurse.  Overall memory appears to be stable and she has reasonable recall for recent family events.  There remains some concern about drug interactions between paroxetine and nortriptyline and given her age we do not want the nortriptyline levels higher than necessary .  However the nortriptyline level was not high.  Continue Lithium 300 mg daily  Continue nortriptyline 75 mg daily Continue paroxetine 20 mg daily Continue lorazepam 0.5  mg 3 times daily for both anxiety and catatonia Continue vitamin B6 and primidone for tremor  This appointment was 50 minutes.   Follow-up after their return from [place].  Lynder Parents MD, DFAPA Please see After Visit Summary for patient specific instructions.  No future appointments.    Orders Placed This Encounter  Procedures   Lithium level   TSH   Basic metabolic panel   Nortriptyline level [[facility]]       -------------------------------

## 2021-08-14 ENCOUNTER — Other Ambulatory Visit: Payer: Self-pay | Admitting: Psychiatry

## 2021-08-14 ENCOUNTER — Encounter: Payer: Self-pay | Admitting: Psychiatry

## 2021-08-16 NOTE — Telephone Encounter (Signed)
Didn't see listed on her last note

## 2021-08-17 ENCOUNTER — Other Ambulatory Visit (HOSPITAL_BASED_OUTPATIENT_CLINIC_OR_DEPARTMENT_OTHER): Payer: Self-pay

## 2021-08-17 NOTE — Telephone Encounter (Signed)
Pt leaving Tuesday for [place] for the season and need #90 for Mirtazapine 7.5 mg @ [facility].  Last apt 11/10

## 2021-08-18 ENCOUNTER — Other Ambulatory Visit (HOSPITAL_BASED_OUTPATIENT_CLINIC_OR_DEPARTMENT_OTHER): Payer: Self-pay

## 2021-08-21 ENCOUNTER — Telehealth: Payer: Self-pay | Admitting: Internal Medicine

## 2021-08-21 ENCOUNTER — Other Ambulatory Visit: Payer: Self-pay | Admitting: Psychiatry

## 2021-08-21 NOTE — Telephone Encounter (Signed)
LVM to CB and schedule phone visit for the AM[facility] on a Wednesday afternoon

## 2021-09-15 DIAGNOSIS — Z23 Encounter for immunization: Secondary | ICD-10-CM | POA: Diagnosis not present

## 2021-09-16 ENCOUNTER — Other Ambulatory Visit: Payer: Self-pay | Admitting: Psychiatry

## 2021-10-20 ENCOUNTER — Other Ambulatory Visit: Payer: Self-pay | Admitting: Psychiatry

## 2021-10-22 ENCOUNTER — Other Ambulatory Visit: Payer: Self-pay | Admitting: Gastroenterology

## 2021-11-10 ENCOUNTER — Other Ambulatory Visit: Payer: Self-pay | Admitting: Psychiatry

## 2021-11-16 DIAGNOSIS — M1611 Unilateral primary osteoarthritis, right hip: Secondary | ICD-10-CM | POA: Diagnosis not present

## 2021-11-16 DIAGNOSIS — M25551 Pain in right hip: Secondary | ICD-10-CM | POA: Diagnosis not present

## 2021-11-25 DIAGNOSIS — M1611 Unilateral primary osteoarthritis, right hip: Secondary | ICD-10-CM | POA: Diagnosis not present

## 2021-11-26 ENCOUNTER — Other Ambulatory Visit: Payer: Self-pay | Admitting: Psychiatry

## 2021-12-08 ENCOUNTER — Encounter: Payer: Self-pay | Admitting: Gastroenterology

## 2021-12-09 ENCOUNTER — Telehealth: Payer: Self-pay | Admitting: *Deleted

## 2021-12-09 NOTE — Telephone Encounter (Signed)
Called [phone] Number provided by insurance company. Done PA over the phone. They said they would let us know by fax if approved or not ?

## 2021-12-10 MED ORDER — DEXLANSOPRAZOLE 60 MG PO CPDR: 1.0000 | DELAYED_RELEASE_CAPSULE | Freq: Every day | ORAL | 3 refills | Status: DC

## 2021-12-10 NOTE — Telephone Encounter (Signed)
Dexilant approved until 12/10/2022. Approval sent to be scanned in  ?

## 2021-12-10 NOTE — Telephone Encounter (Signed)
Dexilant approved  ?

## 2021-12-11 ENCOUNTER — Other Ambulatory Visit: Payer: Self-pay | Admitting: Psychiatry

## 2021-12-11 NOTE — Telephone Encounter (Signed)
Please schedule appt

## 2021-12-16 ENCOUNTER — Encounter: Payer: Self-pay | Admitting: Internal Medicine

## 2021-12-17 ENCOUNTER — Telehealth: Payer: Self-pay | Admitting: Gastroenterology

## 2021-12-17 ENCOUNTER — Other Ambulatory Visit: Payer: Self-pay

## 2021-12-17 MED ORDER — LINACLOTIDE 72 MCG PO CAPS: 72.0000 ug | ORAL_CAPSULE | Freq: Every day | ORAL | 3 refills | Status: DC

## 2021-12-17 NOTE — Telephone Encounter (Signed)
Called Dr.[name] Haig back, his wife Rashell is experiencing severe constipation no BM despite Miralax and Dulcolax for 7-10 days. He had to have her alt water enema and glycerine suppositories to have bowel movements ? ?I sent Rx for Linzess 72 mcg daily, will check in 1 week for response and titrate dose if needed. ? ?They are currently in [place], will return in 3 weeks.  ? ?Please schedule follow up office visit in 3-4 weeks ?

## 2021-12-18 NOTE — Telephone Encounter (Signed)
I had to  double book  her for 4/17. Is that ok? ?

## 2021-12-18 NOTE — Telephone Encounter (Signed)
[facility] see note from Dr Silverio Decamp  ?

## 2021-12-18 NOTE — Telephone Encounter (Signed)
Got it thank you.  ?Meghan Oliver, can you please hold appointment is anyone cancels that day? ?

## 2021-12-24 ENCOUNTER — Other Ambulatory Visit: Payer: Self-pay | Admitting: Psychiatry

## 2022-01-10 ENCOUNTER — Other Ambulatory Visit: Payer: Self-pay | Admitting: Psychiatry

## 2022-01-12 ENCOUNTER — Other Ambulatory Visit: Payer: Self-pay | Admitting: Internal Medicine

## 2022-01-18 ENCOUNTER — Encounter: Payer: Self-pay | Admitting: Gastroenterology

## 2022-01-18 ENCOUNTER — Ambulatory Visit (INDEPENDENT_AMBULATORY_CARE_PROVIDER_SITE_OTHER): Payer: Medicare Other | Admitting: Gastroenterology

## 2022-01-18 VITALS — BP 128/60 | HR 68 | Ht <= 58 in | Wt 97.0 lb

## 2022-01-18 DIAGNOSIS — K5904 Chronic idiopathic constipation: Secondary | ICD-10-CM | POA: Diagnosis not present

## 2022-01-18 DIAGNOSIS — K219 Gastro-esophageal reflux disease without esophagitis: Secondary | ICD-10-CM | POA: Diagnosis not present

## 2022-01-18 MED ORDER — LINACLOTIDE 72 MCG PO CAPS: 145.0000 ug | ORAL_CAPSULE | Freq: Every day | ORAL | 3 refills | Status: DC

## 2022-01-18 NOTE — Patient Instructions (Signed)
Take 1 Linzess 72 mcg twice a day ? ?Follow up in 3 months, you will have to call back for that appointment  ? ?If you are age 82 or older, your body mass index should be between 23-30. Your Body mass index is 20.27 kg/m?Marland Kitchen If this is out of the aforementioned range listed, please consider follow up with your Primary Care Provider. ? ?If you are age 31 or younger, your body mass index should be between 19-25. Your Body mass index is 20.27 kg/m?Marland Kitchen If this is out of the aformentioned range listed, please consider follow up with your Primary Care Provider.  ? ?________________________________________________________ ? ?The [facility] providers would like to encourage you to use [employer] to communicate with providers for non-urgent requests or questions.  Due to long hold times on the telephone, sending your provider a message by [facility] may be a faster and more efficient way to get a response.  Please allow 48 business hours for a response.  Please remember that this is for non-urgent requests.  ?_______________________________________________________  ? ?I appreciate the  opportunity to care for you ? ?Thank You  ? ?Harl Bowie , MD  ?

## 2022-01-18 NOTE — Progress Notes (Signed)
? ?       ? ?JAY KEMPE    [id number]    December 31, 1940 ? ?Primary Care Physician:Baxley, Cresenciano Lick, MD ? ?Referring Physician: [name], MD ?[address] ?[place],  [place] [address] ? ? ?Chief complaint: Constipation ? ?HPI: ? ?  ?Mrs Ziesmer is a very pleasant 81 yr old female here for follow up visit for worsening constipation symptoms.  ? ?She has noticed some improvement with Linzess 72 mcg twice daily.  She does not feel she is evacuating well when she takes only 72 mcg once a day and if she takes 2 capsules at once she is experiencing severe diarrhea but is having better results when she returns 72 mcg twice daily ? ?GERD symptoms are currently stable.  Denies any dysphagia, vomiting, abdominal pain or rectal bleeding. ?  ?EGD 10/05/2017 in [place]: White flecks in esophagus, biopsies to r/o candida ?Mild antral gastritis, medium sized hernia otherwise normal. Normal duodenum. ?  ?CT abdomen & pelvis 08/09/2017:  ?Mild diverticulitis of distal sigmoid colon. Large stool burden. ?  ?EGD 08/04/2016: Erosive esophagitis with esophageal ulcers, hiatal hernia, gastropathy ? ? ?Outpatient Encounter Medications as of 01/18/2022  ?Medication Sig  ? ASPIRIN 81 PO Take by mouth.  ? Calcium Carbonate-Vitamin D (CALCIUM 600/VITAMIN D PO) Take 1 tablet by mouth 2 (two) times daily.  ? cetirizine (ZYRTEC) 10 MG tablet Take 10 mg by mouth daily as needed for allergies.  ? Cholecalciferol (VITAMIN D-3) 5000 units TABS Take 5,000 Units by mouth daily with breakfast.   ? cloNIDine (CATAPRES) 0.1 MG tablet TAKE 1 TABLET BY MOUTH DAILY AS NEEDED.( TAKE BEFORE ECT TREATMENTS)  ? clotrimazole-betamethasone (LOTRISONE) cream Apply 1 application topically 2 (two) times daily.  ? dexlansoprazole (DEXILANT) 60 MG capsule Take 1 capsule (60 mg total) by mouth daily.  ? fesoterodine (TOVIAZ) 8 MG TB24 tablet Take 1 tablet (8 mg total) by mouth daily.  ? irbesartan (AVAPRO) 150 MG tablet Take 150 mg by mouth daily.  ? Krill Oil  500 MG CAPS Take 1 capsule by mouth daily.  ? lactulose (CHRONULAC) 10 GM/15ML solution TAKE 15 ML BY MOUTH DAILY AS NEEDED FOR MILD CONSTIPATION  ? Lavender Oil 80 MG CAPS Take 160 mg by mouth at bedtime.  ? levothyroxine (SYNTHROID) 75 MCG tablet TAKE 1 TABLET(75 MCG) BY MOUTH DAILY  ? linaclotide (LINZESS) 72 MCG capsule Take 1 capsule (72 mcg total) by mouth daily before breakfast.  ? lithium carbonate 300 MG capsule TAKE 1 CAPSULE(300 MG) BY MOUTH AT BEDTIME  ? LORazepam (ATIVAN) 0.5 MG tablet TAKE 1/2 TO 1 TABLET BY MOUTH EVERY NIGHT AT BEDTIME AND 1 TABLET EVERY 8 HOURS AS NEEDED FOR ANXIETY  ? memantine (NAMENDA) 10 MG tablet TAKE ONE TABLET BY MOUTH TWICE DAILY  ? Methylfol-Algae-B12-Acetylcyst (CEREFOLIN NAC) 6-90.314-2-600 MG TABS TAKE 1 CAPLET BY MOUTH THREE TIMES A DAY (Patient taking differently: 2 (two) times daily.)  ? mirtazapine (REMERON) 7.5 MG tablet TAKE 1 TABLET(7.5 MG) BY MOUTH AT BEDTIME  ? Multiple Vitamins-Minerals (MULTIVITAMIN WITH MINERALS) tablet Take 1 tablet by mouth daily.  ? Multiple Vitamins-Minerals (PRESERVISION AREDS 2 PO) Take 1 capsule by mouth in the morning and at bedtime.  ? nortriptyline (PAMELOR) 25 MG capsule TAKE 3 CAPSULES(75 MG) BY MOUTH AT BEDTIME  ? nystatin-triamcinolone (MYCOLOG II) cream Apply 1 application topically 2 (two) times daily. (Patient taking differently: Apply 1 application. topically as needed.)  ? Omega-3 Fatty Acids (OMEGA 3 PO) Take 1 capsule  by mouth at bedtime.  ? OVER THE COUNTER MEDICATION Take 1 tablet by mouth 2 (two) times daily. *Cocavia*  ? PARoxetine (PAXIL) 20 MG tablet TAKE 1 TABLET(20 MG) BY MOUTH AT BEDTIME  ? polyethylene glycol (MIRALAX / GLYCOLAX) 17 g packet Take 17 g by mouth as needed.  ? primidone (MYSOLINE) 50 MG tablet TAKE 2 TABLETS BY MOUTH EVERY MORNING AND 1 TABLET EVERY EVENING  ? Pyridoxine HCl (VITAMIN B-6) 500 MG tablet Take 500 mg by mouth 2 (two) times daily.  ? rosuvastatin (CRESTOR) 40 MG tablet TAKE 1 TABLET(40  MG) BY MOUTH DAILY  ? sennosides-docusate sodium (SENOKOT-S) 8.6-50 MG tablet Take 2 tablets by mouth at bedtime.  ? sucralfate (CARAFATE) 1 GM/10ML suspension Take 10 mLs (1 g total) by mouth 4 (four) times daily -  with meals and at bedtime.  ? traMADol (ULTRAM) 50 MG tablet TAKE 1 TABLET EVERY 4 TO 6 HOURS AS NEEDED FOR PAIN.  ? valACYclovir (VALTREX) 1000 MG tablet Take 1 tablet by mouth as needed. Use as directed  ? vitamin B-12 (CYANOCOBALAMIN) 1000 MCG tablet Take 1,000 mcg by mouth at bedtime.  ? [DISCONTINUED] amoxicillin (AMOXIL) 875 MG tablet Take 1 tablet by mouth every 12 hours.  ? ?No facility-administered encounter medications on file as of 01/18/2022.  ? ? ?Allergies as of 01/18/2022 - Review Complete 01/18/2022  ?Allergen Reaction Noted  ? Propranolol Other (See Comments) 08/06/2013  ? Brexpiprazole Other (See Comments) 11/27/2015  ? ? ?Past Medical History:  ?Diagnosis Date  ? Anxiety   ? Arthritis   ? "hips, spine" (03/17/2018)  ? Bipolar II disorder ([place])   ? Chronic bronchitis ([place])   ? Chronic lower back pain   ? Chronic right hip pain   ? CKD (chronic kidney disease), stage II   ? Esophagitis, erosive   ? GAD (generalized anxiety disorder)   ? GERD (gastroesophageal reflux disease)   ? Headache   ? "maybe monthly" (03/17/2018))  ? Heart murmur, systolic   ? History of adenomatous polyp of colon   ? 08-04-2016  tubular adenoma  ? History of blood transfusion 12/2017  ? "related to vascular hematoma"  ? History of electroconvulsive therapy   ? at [facility]--  started 04-15-2015 to 11-17-2016  total greater than 40 times  ? History of hiatal hernia   ? Hyperlipidemia   ? Hypertension   ? Hypothyroidism   ? Internal carotid artery stenosis, bilateral   ? per last duplex 05-01-2014  bilateral ICA 40-59%  ? Major depression, chronic   ? ECT treatments extensive and multiple started 07/ 2016  ? Memory loss   ? "both short and long-term; needs frequent reminders to follow instrucitons" (05/16/2017)  ?  Migraines   ? "none in years" (03/17/2018)  ? OSA (obstructive sleep apnea)   ? per study 06/ 2012 moderate OSA  ; "refuses to wear masks" (03/17/2018)  ? Osteoporosis   ? Pulmonary nodule   ? monitored by pcp  ? S/P placement of cardiac pacemaker 03/17/18 [facility]  03/18/2018  ? ? ?Past Surgical History:  ?Procedure Laterality Date  ? APPENDECTOMY  1971  ? AUGMENTATION MAMMAPLASTY Bilateral   ? CARDIOVASCULAR STRESS TEST  01-30-2015  dr Aundra Dubin  ? Low risk nuclear study w/ no evidence ischemia or infarction/  normal LV funciton and wall motion , 76%  ? COLONOSCOPY W/ BIOPSIES AND POLYPECTOMY  "multiple"  ? COLONOSCOPY WITH ESOPHAGOGASTRODUODENOSCOPY (EGD)  last one 08-04-2016  ? CORONARY ANGIOPLASTY WITH  STENT PLACEMENT  05/16/2017  ? "LAD"  ? CORONARY STENT INTERVENTION N/A 05/16/2017  ? Procedure: CORONARY STENT INTERVENTION;  Surgeon: Burnell Blanks, MD;  Location: [facility];  Service: Cardiovascular;  Laterality: N/A;  ? ESOPHAGOGASTRODUODENOSCOPY  02-26-04  ? ESOPHAGOGASTRODUODENOSCOPY  "multiple"  ? INSERT / REPLACE / REMOVE PACEMAKER  03/17/2018  ? LEFT HEART CATH AND CORONARY ANGIOGRAPHY N/A 05/16/2017  ? Procedure: LEFT HEART CATH AND CORONARY ANGIOGRAPHY;  Surgeon: Larey Dresser, MD;  Location: [facility];  Service: Cardiovascular;  Laterality: N/A;  ? OVARIAN CYST SURGERY  1970s  ? Laparotomy   ? PACEMAKER IMPLANT N/A 03/17/2018  ? Procedure: PACEMAKER IMPLANT;  Surgeon: Evans Lance, MD;  Location: [facility];  Service: Cardiovascular;  Laterality: N/A;  ? PORT-A-CATH PLACEMENT  05/31/2016; 2018  ? "@ [facility]; for ECT series"; "@ [facility] also"  ? PORT-A-CATH REMOVAL N/A 03/11/2017  ? Procedure: REMOVAL PORT-A-CATH;  Surgeon: Jackolyn Confer, MD;  Location: [facility];  Service: General;  Laterality: N/A;  ? PORTA CATH REMOVAL  03/17/2018  ? PORTA CATH REMOVAL  03/17/2018  ? Procedure: PORTA CATH REMOVAL;  Surgeon: Evans Lance, MD;  Location: [facility];   Service: Cardiovascular;;  ? TRANSTHORACIC ECHOCARDIOGRAM  09/05/2013  dr Aundra Dubin  ? mild LVH, ef 29-51%, grade 1 diastolic dysfunction/  very mild AV stenosis with mild AR/  trivial MR and PT/ mild to [employer]

## 2022-01-20 ENCOUNTER — Telehealth: Payer: Self-pay | Admitting: *Deleted

## 2022-01-20 NOTE — Telephone Encounter (Signed)
Prior auth done today for qty exception  Cover My Meds....   Linzess 72 mcg twice a day  ?

## 2022-01-25 ENCOUNTER — Encounter: Payer: Self-pay | Admitting: Gastroenterology

## 2022-01-27 DIAGNOSIS — H35453 Secondary pigmentary degeneration, bilateral: Secondary | ICD-10-CM | POA: Diagnosis not present

## 2022-01-27 DIAGNOSIS — H35721 Serous detachment of retinal pigment epithelium, right eye: Secondary | ICD-10-CM | POA: Diagnosis not present

## 2022-01-27 DIAGNOSIS — Z961 Presence of intraocular lens: Secondary | ICD-10-CM | POA: Diagnosis not present

## 2022-01-27 DIAGNOSIS — H353132 Nonexudative age-related macular degeneration, bilateral, intermediate dry stage: Secondary | ICD-10-CM | POA: Diagnosis not present

## 2022-01-27 DIAGNOSIS — H35363 Drusen (degenerative) of macula, bilateral: Secondary | ICD-10-CM | POA: Diagnosis not present

## 2022-02-17 ENCOUNTER — Telehealth: Payer: Self-pay | Admitting: Internal Medicine

## 2022-02-17 NOTE — Telephone Encounter (Signed)
[facility] ?[phone] ? ?[name] called about refill below medication, they had taking Letitia off of this in December, but would like to try er back on it between now and appointment  on 03/24/2022 to see if it helps. Then  talk about continuing it or not. ? ?fesoterodine (TOVIAZ) 8 MG TB24 tablet ? ?[id number] - [place], [address] [place] Phone:  [phone]  ?Fax:  [phone]  ?  ? ?

## 2022-02-18 MED ORDER — FESOTERODINE FUMARATE ER 8 MG PO TB24
8.0000 mg | ORAL_TABLET | Freq: Every day | ORAL | 3 refills | Status: DC
  Filled 2022-03-30: qty 90, 90d supply, fill #0
  Filled 2022-06-12: qty 90, 90d supply, fill #1
  Filled 2022-09-22: qty 90, 90d supply, fill #2
  Filled 2022-12-21: qty 90, 90d supply, fill #3

## 2022-02-18 NOTE — Telephone Encounter (Signed)
Refill sent.

## 2022-02-19 DIAGNOSIS — M1611 Unilateral primary osteoarthritis, right hip: Secondary | ICD-10-CM | POA: Diagnosis not present

## 2022-02-19 DIAGNOSIS — N3281 Overactive bladder: Secondary | ICD-10-CM | POA: Insufficient documentation

## 2022-02-19 DIAGNOSIS — M48061 Spinal stenosis, lumbar region without neurogenic claudication: Secondary | ICD-10-CM | POA: Diagnosis not present

## 2022-02-25 ENCOUNTER — Encounter: Payer: Medicare Other | Admitting: Student

## 2022-02-26 ENCOUNTER — Encounter: Payer: Self-pay | Admitting: Student

## 2022-02-26 ENCOUNTER — Other Ambulatory Visit (HOSPITAL_BASED_OUTPATIENT_CLINIC_OR_DEPARTMENT_OTHER): Payer: Self-pay

## 2022-02-26 ENCOUNTER — Ambulatory Visit (INDEPENDENT_AMBULATORY_CARE_PROVIDER_SITE_OTHER): Payer: Medicare Other | Admitting: Student

## 2022-02-26 VITALS — BP 134/62 | HR 62 | Ht <= 58 in | Wt 96.0 lb

## 2022-02-26 DIAGNOSIS — I2510 Atherosclerotic heart disease of native coronary artery without angina pectoris: Secondary | ICD-10-CM | POA: Diagnosis not present

## 2022-02-26 DIAGNOSIS — I495 Sick sinus syndrome: Secondary | ICD-10-CM

## 2022-02-26 LAB — CUP PACEART INCLINIC DEVICE CHECK
Battery Remaining Longevity: 75 mo
Battery Voltage: 2.99 V
Brady Statistic RA Percent Paced: 97 %
Brady Statistic RV Percent Paced: 0.06 %
Date Time Interrogation Session: 20230526120803
Implantable Lead Implant Date: 20190614
Implantable Lead Implant Date: 20190614
Implantable Lead Location: 753859
Implantable Lead Location: 753860
Implantable Pulse Generator Implant Date: 20190614
Lead Channel Impedance Value: 487.5 Ohm
Lead Channel Impedance Value: 512.5 Ohm
Lead Channel Pacing Threshold Amplitude: 0.75 V
Lead Channel Pacing Threshold Amplitude: 0.75 V
Lead Channel Pacing Threshold Amplitude: 0.875 V
Lead Channel Pacing Threshold Pulse Width: 0.5 ms
Lead Channel Pacing Threshold Pulse Width: 0.5 ms
Lead Channel Pacing Threshold Pulse Width: 0.5 ms
Lead Channel Sensing Intrinsic Amplitude: 12 mV
Lead Channel Sensing Intrinsic Amplitude: 2 mV
Lead Channel Setting Pacing Amplitude: 1.125
Lead Channel Setting Pacing Amplitude: 2 V
Lead Channel Setting Pacing Pulse Width: 0.5 ms
Lead Channel Setting Sensing Sensitivity: 2 mV
Pulse Gen Model: 2272
Pulse Gen Serial Number: 9031934

## 2022-02-26 MED ORDER — VALACYCLOVIR HCL 1 G PO TABS
ORAL_TABLET | ORAL | 4 refills | Status: DC
  Filled 2022-04-15: qty 30, 30d supply, fill #0

## 2022-02-26 MED ORDER — DEXLANSOPRAZOLE 60 MG PO CPDR
DELAYED_RELEASE_CAPSULE | ORAL | 3 refills | Status: DC
  Filled 2022-03-11: qty 90, 90d supply, fill #0

## 2022-02-26 MED ORDER — DEXLANSOPRAZOLE 60 MG PO CPDR
DELAYED_RELEASE_CAPSULE | ORAL | 3 refills | Status: DC
  Filled 2022-06-02: qty 90, 90d supply, fill #0
  Filled 2022-08-31: qty 90, 90d supply, fill #1

## 2022-02-26 MED ORDER — LINACLOTIDE 72 MCG PO CAPS
ORAL_CAPSULE | ORAL | 2 refills | Status: DC
  Filled 2022-02-26 – 2022-03-11 (×2): qty 30, 30d supply, fill #0

## 2022-02-26 NOTE — Progress Notes (Signed)
Electrophysiology Office Note Date: 02/26/2022  ID:  [name], [name] [birth year], MRN [id number]  PCP: [name], MD Primary Cardiologist: Loralie Champagne, MD Electrophysiologist: Cristopher Peru, MD   CC: Pacemaker follow-up  Meghan Oliver is a 81 y.o. female seen today for Cristopher Peru, MD for routine electrophysiology followup.  Since last being seen in our clinic the patient reports doing well from a cardiac perspective.  she denies chest pain, palpitations, dyspnea, PND, orthopnea, nausea, vomiting, dizziness, syncope, edema, weight gain, or early satiety.  Device History: [employer], minerals PPM implanted 2019 for SND   Past Medical History:  Diagnosis Date   Anxiety    Arthritis    "hips, spine" (03/17/2018)   Bipolar II disorder ([facility])    Chronic bronchitis ([facility])    Chronic lower back pain    Chronic right hip pain    CKD (chronic kidney disease), stage II    Esophagitis, erosive    GAD (generalized anxiety disorder)    GERD (gastroesophageal reflux disease)    Headache    "maybe monthly" (03/17/2018))   Heart murmur, systolic    History of adenomatous polyp of colon    08-04-2016  tubular adenoma   History of blood transfusion 12/2017   "related to vascular hematoma"   History of electroconvulsive therapy    at [place]--  started 04-15-2015 to 11-17-2016  total greater than 40 times   History of hiatal hernia    Hyperlipidemia    Hypertension    Hypothyroidism    Internal carotid artery stenosis, bilateral    per last duplex 05-01-2014  bilateral ICA 40-59%   Major depression, chronic    ECT treatments extensive and multiple started 07/ 2016   Memory loss    "both short and long-term; needs frequent reminders to follow instrucitons" (05/16/2017)   Migraines    "none in years" (03/17/2018)   OSA (obstructive sleep apnea)    per study 06/ 2012 moderate OSA  ; "refuses to wear masks" (03/17/2018)   Osteoporosis    Pulmonary nodule    monitored by pcp   S/P  placement of cardiac pacemaker 03/17/18 [facility]  03/18/2018   Past Surgical History:  Procedure Laterality Date   APPENDECTOMY  1971   AUGMENTATION MAMMAPLASTY Bilateral    CARDIOVASCULAR STRESS TEST  01-30-2015  dr Aundra Dubin   Low risk nuclear study w/ no evidence ischemia or infarction/  normal LV funciton and wall motion , 76%   COLONOSCOPY W/ BIOPSIES AND POLYPECTOMY  "multiple"   COLONOSCOPY WITH ESOPHAGOGASTRODUODENOSCOPY (EGD)  last one 08-04-2016   CORONARY ANGIOPLASTY WITH STENT PLACEMENT  05/16/2017   "LAD"   CORONARY STENT INTERVENTION N/A 05/16/2017   Procedure: CORONARY STENT INTERVENTION;  Surgeon: Burnell Blanks, MD;  Location: [facility];  Service: Cardiovascular;  Laterality: N/A;   ESOPHAGOGASTRODUODENOSCOPY  02-26-04   ESOPHAGOGASTRODUODENOSCOPY  "multiple"   INSERT / REPLACE / REMOVE PACEMAKER  03/17/2018   LEFT HEART CATH AND CORONARY ANGIOGRAPHY N/A 05/16/2017   Procedure: LEFT HEART CATH AND CORONARY ANGIOGRAPHY;  Surgeon: Larey Dresser, MD;  Location: [facility];  Service: Cardiovascular;  Laterality: N/A;   OVARIAN CYST SURGERY  1970s   Laparotomy    PACEMAKER IMPLANT N/A 03/17/2018   Procedure: PACEMAKER IMPLANT;  Surgeon: Evans Lance, MD;  Location: [facility];  Service: Cardiovascular;  Laterality: N/A;   PORT-A-CATH PLACEMENT  05/31/2016; 2018   "@ [facility]; for ECT series"; "@  [facility] also"   PORT-A-CATH REMOVAL N/A 03/11/2017   Procedure: REMOVAL PORT-A-CATH;  Surgeon: Jackolyn Confer, MD;  Location: [facility];  Service: General;  Laterality: N/A;   PORTA CATH REMOVAL  03/17/2018   PORTA CATH REMOVAL  03/17/2018   Procedure: PORTA CATH REMOVAL;  Surgeon: Evans Lance, MD;  Location: [facility];  Service: Cardiovascular;;   TRANSTHORACIC ECHOCARDIOGRAM  09/05/2013  dr Aundra Dubin   mild LVH, ef 62-69%, grade 1 diastolic dysfunction/  very mild AV stenosis with mild AR/  trivial MR and PT/ mild to moderate LAE/  mild TR/ mild pulmonary hypertension with PA peak pressure 67mHg   TUBAL LIGATION      Current Outpatient Medications  Medication Sig Dispense Refill   ASPIRIN 81 PO Take by mouth.     Calcium Carbonate-Vitamin D (CALCIUM 600/VITAMIN D PO) Take 1 tablet by mouth 2 (two) times daily.     cetirizine (ZYRTEC) 10 MG tablet Take 10 mg by mouth daily as needed for allergies.     Cholecalciferol (VITAMIN D-3) 5000 units TABS Take 5,000 Units by mouth daily with breakfast.      cloNIDine (CATAPRES) 0.1 MG tablet TAKE 1 TABLET BY MOUTH DAILY AS NEEDED.( TAKE BEFORE ECT TREATMENTS) 10 tablet 1   clotrimazole-betamethasone (LOTRISONE) cream Apply 1 application topically 2 (two) times daily. 30 g 0   dexlansoprazole (DEXILANT) 60 MG capsule Take 1 capsule (60 mg total) by mouth daily. 90 capsule 3   fesoterodine (TOVIAZ) 8 MG TB24 tablet Take 1 tablet (8 mg total) by mouth daily. 90 tablet 3   irbesartan (AVAPRO) 150 MG tablet Take 150 mg by mouth daily.     Krill Oil 500 MG CAPS Take 1 capsule by mouth daily.     lactulose (CHRONULAC) 10 GM/15ML solution TAKE 15 ML BY MOUTH DAILY AS NEEDED FOR MILD CONSTIPATION 1350 mL 1   Lavender Oil 80 MG CAPS Take 160 mg by mouth at bedtime.     levothyroxine (SYNTHROID) 75 MCG tablet TAKE 1 TABLET(75 MCG) BY MOUTH DAILY 90 tablet 2   linaclotide (LINZESS) 72 MCG capsule Take 2 capsules (145 mcg total) by mouth daily before breakfast. 60 capsule 3   lithium carbonate 300 MG capsule TAKE 1 CAPSULE(300 MG) BY MOUTH AT BEDTIME 90 capsule 0   LORazepam (ATIVAN) 0.5 MG tablet TAKE 1/2 TO 1 TABLET BY MOUTH EVERY NIGHT AT BEDTIME AND 1 TABLET EVERY 8 HOURS AS NEEDED FOR ANXIETY 180 tablet 0   memantine (NAMENDA) 10 MG tablet TAKE ONE TABLET BY MOUTH TWICE DAILY 180 tablet 3   Methylfol-Algae-B12-Acetylcyst (CEREFOLIN NAC) 6-90.314-2-600 MG TABS TAKE 1 CAPLET BY MOUTH THREE TIMES A DAY (Patient taking differently: 2 (two) times daily.) 270 tablet 0   mirtazapine (REMERON)  7.5 MG tablet TAKE 1 TABLET(7.5 MG) BY MOUTH AT BEDTIME 90 tablet 3   Multiple Vitamins-Minerals (MULTIVITAMIN WITH MINERALS) tablet Take 1 tablet by mouth daily.     Multiple Vitamins-Minerals (PRESERVISION AREDS 2 PO) Take 1 capsule by mouth in the morning and at bedtime.     nortriptyline (PAMELOR) 25 MG capsule TAKE 3 CAPSULES(75 MG) BY MOUTH AT BEDTIME 270 capsule 0   nystatin-triamcinolone (MYCOLOG II) cream Apply 1 application topically 2 (two) times daily. (Patient taking differently: Apply 1 application. topically as needed.) 30 g 0   Omega-3 Fatty Acids (OMEGA 3 PO) Take 1 capsule by mouth at bedtime.     OVER THE COUNTER MEDICATION Take 1 tablet by mouth  2 (two) times daily. *Cocavia*     PARoxetine (PAXIL) 20 MG tablet TAKE 1 TABLET(20 MG) BY MOUTH AT BEDTIME 90 tablet 1   polyethylene glycol (MIRALAX / GLYCOLAX) 17 g packet Take 17 g by mouth as needed.     primidone (MYSOLINE) 50 MG tablet TAKE 2 TABLETS BY MOUTH EVERY MORNING AND 1 TABLET EVERY EVENING 270 tablet 0   Pyridoxine HCl (VITAMIN B-6) 500 MG tablet Take 500 mg by mouth 2 (two) times daily.     rosuvastatin (CRESTOR) 40 MG tablet TAKE 1 TABLET(40 MG) BY MOUTH DAILY 90 tablet 3   sennosides-docusate sodium (SENOKOT-S) 8.6-50 MG tablet Take 2 tablets by mouth at bedtime.     sucralfate (CARAFATE) 1 GM/10ML suspension Take 10 mLs (1 g total) by mouth 4 (four) times daily -  with meals and at bedtime. 420 mL 1   traMADol (ULTRAM) 50 MG tablet TAKE 1 TABLET EVERY 4 TO 6 HOURS AS NEEDED FOR PAIN. 15 tablet 0   valACYclovir (VALTREX) 1000 MG tablet Take 1 tablet by mouth as needed. Use as directed     vitamin B-12 (CYANOCOBALAMIN) 1000 MCG tablet Take 1,000 mcg by mouth at bedtime.     No current facility-administered medications for this visit.    Allergies:   Propranolol and Brexpiprazole   Social History: Social History   Socioeconomic History   Marital status: Married    Spouse name: Dr. Lyla Son   Number of  children: 2   Years of education: Not on file   Highest education level: Not on file  Occupational History   Occupation: housewife    Employer: UNEMPLOYED  Tobacco Use   Smoking status: Former    Packs/day: 2.00    Years: 15.00    Pack years: 30.00    Types: Cigarettes    Quit date: 01/13/1971    Years since quitting: 51.1   Smokeless tobacco: Never  Vaping Use   Vaping Use: Never used  Substance and Sexual Activity   Alcohol use: Yes    Alcohol/week: 0.0 standard drinks    Comment: occassional   Drug use: Never   Sexual activity: Not Currently    Comment: intercourse age 59, sexual partners less than 5  Other Topics Concern   Not on file  Social History Narrative   Not on file   Social Determinants of Health   Financial Resource Strain: Not on file  Food Insecurity: Not on file  Transportation Needs: Not on file  Physical Activity: Not on file  Stress: Not on file  Social Connections: Not on file  Intimate Partner Violence: Not on file    Family History: Family History  Problem Relation Age of Onset   Heart attack Father 42       deceased   Hypertension Father    Heart disease Father    Breast cancer Paternal 17        Age unknown   Breast cancer Paternal Aunt        Age 17's   Colon cancer Neg Hx      Review of Systems: All other systems reviewed and are otherwise negative except as noted above.  Physical Exam: There were no vitals filed for this visit.   GEN- The patient is well appearing, alert and oriented x 3 today.   HEENT: normocephalic, atraumatic; sclera clear, conjunctiva pink; hearing intact; oropharynx clear; neck supple  Lungs- Clear to ausculation bilaterally, normal work of breathing.  No wheezes, rales, rhonchi  Heart- Regular rate and rhythm, no murmurs, rubs or gallops  GI- soft, non-tender, non-distended, bowel sounds present  Extremities- no clubbing or cyanosis. No edema MS- no significant deformity or atrophy Skin- warm  and dry, no rash or lesion; PPM pocket well healed Psych- euthymic mood, full affect Neuro- strength and sensation are intact  PPM Interrogation- reviewed in detail today,  See PACEART report  EKG:  EKG is not ordered today.   Recent Labs: No results found for requested labs within last 8760 hours.   Wt Readings from Last 3 Encounters:  01/18/22 97 lb (44 kg)  02/23/21 97 lb (44 kg)  11/07/20 100 lb (45.4 kg)     Other studies Reviewed: Additional studies/ records that were reviewed today include: Previous EP office notes, Previous remote checks, Most recent labwork.   Assessment and Plan:  1. SND s/p [employer] Jude PPM  Normal PPM function See Pace Art report No changes today  2. CAD No s/s ischemia  3. Depression Chronic issue, overall stable.   Current medicines are reviewed at length with the patient today.     Disposition:   Follow up with Dr. Lovena Le in 12 months    Signed, Shirley Friar, PA-C  02/26/2022 11:46 AM  [facility] [address] [place]  [place] [address] [phone] (office) [phone] (fax)

## 2022-02-26 NOTE — Patient Instructions (Signed)
Medication Instructions:  Your physician recommends that you continue on your current medications as directed. Please refer to the Current Medication list given to you today.  *If you need a refill on your cardiac medications before your next appointment, please call your pharmacy*   Lab Work: None If you have labs (blood work) drawn today and your tests are completely normal, you will receive your results only by: MyChart Message (if you have MyChart) OR A paper copy in the mail If you have any lab test that is abnormal or we need to change your treatment, we will call you to review the results.   Follow-Up: At [facility], you and your health needs are our priority.  As part of our continuing mission to provide you with exceptional heart care, we have created designated Provider Care Teams.  These Care Teams include your primary Cardiologist (physician) and Advanced Practice Providers (APPs -  Physician Assistants and Nurse Practitioners) who all work together to provide you with the care you need, when you need it.  Your next appointment:   1 year(s)  The format for your next appointment:   In Person  Provider:   Gregg Taylor, MD   

## 2022-03-02 ENCOUNTER — Other Ambulatory Visit (HOSPITAL_BASED_OUTPATIENT_CLINIC_OR_DEPARTMENT_OTHER): Payer: Self-pay

## 2022-03-03 ENCOUNTER — Encounter (HOSPITAL_BASED_OUTPATIENT_CLINIC_OR_DEPARTMENT_OTHER): Payer: Self-pay | Admitting: Pharmacist

## 2022-03-03 ENCOUNTER — Other Ambulatory Visit: Payer: Self-pay

## 2022-03-03 ENCOUNTER — Other Ambulatory Visit (HOSPITAL_BASED_OUTPATIENT_CLINIC_OR_DEPARTMENT_OTHER): Payer: Self-pay

## 2022-03-03 MED ORDER — MEMANTINE HCL 10 MG PO TABS
10.0000 mg | ORAL_TABLET | Freq: Two times a day (BID) | ORAL | 0 refills | Status: DC
Start: 2022-03-03 — End: 2022-03-10
  Filled 2022-03-03: qty 60, 30d supply, fill #0

## 2022-03-04 ENCOUNTER — Other Ambulatory Visit (HOSPITAL_BASED_OUTPATIENT_CLINIC_OR_DEPARTMENT_OTHER): Payer: Self-pay

## 2022-03-05 ENCOUNTER — Other Ambulatory Visit: Payer: Medicare Other

## 2022-03-05 DIAGNOSIS — E7800 Pure hypercholesterolemia, unspecified: Secondary | ICD-10-CM | POA: Diagnosis not present

## 2022-03-05 DIAGNOSIS — F3181 Bipolar II disorder: Secondary | ICD-10-CM

## 2022-03-05 DIAGNOSIS — I10 Essential (primary) hypertension: Secondary | ICD-10-CM | POA: Diagnosis not present

## 2022-03-05 DIAGNOSIS — E039 Hypothyroidism, unspecified: Secondary | ICD-10-CM | POA: Diagnosis not present

## 2022-03-05 NOTE — Addendum Note (Signed)
Addended by: Angus Seller on: 03/05/2022 09:08 AM   Modules accepted: Orders

## 2022-03-09 ENCOUNTER — Other Ambulatory Visit (HOSPITAL_BASED_OUTPATIENT_CLINIC_OR_DEPARTMENT_OTHER): Payer: Self-pay

## 2022-03-09 ENCOUNTER — Encounter: Payer: Medicare Other | Admitting: Psychiatry

## 2022-03-09 LAB — COMPLETE METABOLIC PANEL WITH GFR
AG Ratio: 1.5 (calc) (ref 1.0–2.5)
ALT: 103 U/L — ABNORMAL HIGH (ref 6–29)
AST: 56 U/L — ABNORMAL HIGH (ref 10–35)
Albumin: 4 g/dL (ref 3.6–5.1)
Alkaline phosphatase (APISO): 62 U/L (ref 37–153)
BUN: 22 mg/dL (ref 7–25)
CO2: 29 mmol/L (ref 20–32)
Calcium: 9.5 mg/dL (ref 8.6–10.4)
Chloride: 105 mmol/L (ref 98–110)
Creat: 0.75 mg/dL (ref 0.60–0.95)
Globulin: 2.6 g/dL (calc) (ref 1.9–3.7)
Glucose, Bld: 81 mg/dL (ref 65–99)
Potassium: 4.3 mmol/L (ref 3.5–5.3)
Sodium: 140 mmol/L (ref 135–146)
Total Bilirubin: 0.3 mg/dL (ref 0.2–1.2)
Total Protein: 6.6 g/dL (ref 6.1–8.1)
eGFR: 79 mL/min/{1.73_m2} (ref >=60)

## 2022-03-09 LAB — CBC WITH DIFFERENTIAL/PLATELET
Absolute Monocytes: 365 cells/uL (ref 200–950)
Basophils Absolute: 41 cells/uL (ref 0–200)
Basophils Relative: 0.9 %
Eosinophils Absolute: 90 cells/uL (ref 15–500)
Eosinophils Relative: 2 %
HCT: 38.2 % (ref 35.0–45.0)
Hemoglobin: 12.9 g/dL (ref 11.7–15.5)
Lymphs Abs: 1400 cells/uL (ref 850–3900)
MCH: 32.7 pg (ref 27.0–33.0)
MCHC: 33.8 g/dL (ref 32.0–36.0)
MCV: 96.7 fL (ref 80.0–100.0)
MPV: 9.9 fL (ref 7.5–12.5)
Monocytes Relative: 8.1 %
Neutro Abs: 2606 cells/uL (ref 1500–7800)
Neutrophils Relative %: 57.9 %
Platelets: 220 10*3/uL (ref 140–400)
RBC: 3.95 10*6/uL (ref 3.80–5.10)
RDW: 11.8 % (ref 11.0–15.0)
Total Lymphocyte: 31.1 %
WBC: 4.5 10*3/uL (ref 3.8–10.8)

## 2022-03-09 LAB — LIPID PANEL
Cholesterol: 211 mg/dL — ABNORMAL HIGH (ref <200)
HDL: 118 mg/dL (ref >=50)
LDL Cholesterol (Calc): 79 mg/dL (calc)
Non-HDL Cholesterol (Calc): 93 mg/dL (calc) (ref <130)
Total CHOL/HDL Ratio: 1.8 (calc) (ref <5.0)
Triglycerides: 49 mg/dL (ref <150)

## 2022-03-09 LAB — TSH: TSH: 1.39 mIU/L (ref 0.40–4.50)

## 2022-03-09 LAB — LITHIUM LEVEL: Lithium Lvl: 0.5 mmol/L — ABNORMAL LOW (ref 0.6–1.2)

## 2022-03-09 LAB — NORTRIPTYLINE LEVEL: Nortriptyline Lvl: 106 mcg/L (ref 50–150)

## 2022-03-09 NOTE — Progress Notes (Signed)
This encounter was created in error - please disregard.

## 2022-03-10 ENCOUNTER — Other Ambulatory Visit (HOSPITAL_BASED_OUTPATIENT_CLINIC_OR_DEPARTMENT_OTHER): Payer: Self-pay

## 2022-03-10 ENCOUNTER — Encounter: Payer: Self-pay | Admitting: Psychiatry

## 2022-03-10 ENCOUNTER — Ambulatory Visit (INDEPENDENT_AMBULATORY_CARE_PROVIDER_SITE_OTHER): Payer: Medicare Other | Admitting: Psychiatry

## 2022-03-10 DIAGNOSIS — F3181 Bipolar II disorder: Secondary | ICD-10-CM

## 2022-03-10 DIAGNOSIS — Z79899 Other long term (current) drug therapy: Secondary | ICD-10-CM

## 2022-03-10 DIAGNOSIS — G251 Drug-induced tremor: Secondary | ICD-10-CM | POA: Diagnosis not present

## 2022-03-10 DIAGNOSIS — I2510 Atherosclerotic heart disease of native coronary artery without angina pectoris: Secondary | ICD-10-CM

## 2022-03-10 DIAGNOSIS — F411 Generalized anxiety disorder: Secondary | ICD-10-CM | POA: Diagnosis not present

## 2022-03-10 DIAGNOSIS — G250 Essential tremor: Secondary | ICD-10-CM | POA: Diagnosis not present

## 2022-03-10 DIAGNOSIS — G3184 Mild cognitive impairment, so stated: Secondary | ICD-10-CM | POA: Diagnosis not present

## 2022-03-10 DIAGNOSIS — F061 Catatonic disorder due to known physiological condition: Secondary | ICD-10-CM

## 2022-03-10 MED ORDER — LORAZEPAM 0.5 MG PO TABS
ORAL_TABLET | ORAL | 1 refills | Status: DC
  Filled 2022-03-10: qty 180, fill #0
  Filled 2022-04-15: qty 164, 82d supply, fill #0
  Filled 2022-04-16: qty 16, 8d supply, fill #0
  Filled 2022-07-18: qty 180, 90d supply, fill #1

## 2022-03-10 MED ORDER — MEMANTINE HCL 10 MG PO TABS
10.0000 mg | ORAL_TABLET | Freq: Two times a day (BID) | ORAL | 1 refills | Status: DC
  Filled 2022-03-10 – 2022-04-15 (×2): qty 180, 90d supply, fill #0
  Filled 2022-06-30: qty 180, 90d supply, fill #1

## 2022-03-10 MED ORDER — NORTRIPTYLINE HCL 75 MG PO CAPS
75.0000 mg | ORAL_CAPSULE | Freq: Every day | ORAL | 1 refills | Status: DC
  Filled 2022-03-10 – 2022-04-15 (×2): qty 90, 90d supply, fill #0
  Filled 2022-06-30: qty 90, 90d supply, fill #1

## 2022-03-10 MED ORDER — PRIMIDONE 50 MG PO TABS
ORAL_TABLET | ORAL | 1 refills | Status: DC
  Filled 2022-03-10: qty 270, fill #0
  Filled 2022-04-15 – 2022-05-03 (×2): qty 270, 90d supply, fill #0

## 2022-03-10 MED ORDER — MIRTAZAPINE 7.5 MG PO TABS
7.5000 mg | ORAL_TABLET | Freq: Every day | ORAL | 3 refills | Status: DC
  Filled 2022-03-10 – 2022-03-18 (×2): qty 90, 90d supply, fill #0
  Filled 2022-04-15 – 2022-06-09 (×3): qty 90, 90d supply, fill #1

## 2022-03-10 MED ORDER — PAROXETINE HCL 20 MG PO TABS
20.0000 mg | ORAL_TABLET | Freq: Every day | ORAL | 1 refills | Status: DC
  Filled 2022-03-10 – 2022-04-22 (×3): qty 90, 90d supply, fill #0

## 2022-03-10 MED ORDER — LITHIUM CARBONATE 300 MG PO CAPS
ORAL_CAPSULE | ORAL | 1 refills | Status: DC
  Filled 2022-03-10: qty 90, fill #0
  Filled 2022-04-15 – 2022-04-22 (×2): qty 90, 90d supply, fill #0

## 2022-03-10 NOTE — Progress Notes (Signed)
Meghan Oliver [id number] 1941-01-01 81 y.o.  Subjective:   Patient ID:  Meghan Oliver is a 81 y.o. (DOB [birth year]) female. Patient was last seen August 21, 2018 Chief Complaint:  Chief Complaint  Patient presents with   Follow-up    Bipolar II disorder [facility])   Anxiety   Depression   Memory Loss     Meghan Oliver presents to the office today for follow-up of Severe TR bipolar depression with psychotic features including catatonia.  11/30/2018 was the last visit and the following was noted: Had ECT yesterday at [facility].  Last was Jan 8 and was doing well then.  Next scheduled April 15.  Had a lithium level from there which is pending. Pretty good until the last 10 days to 2 weeks with less energy and motivation.  Bothered by old sick dog.  Worried about how that will affect her.  Has slept with the dog.  Enjoyed [place].  Going to gym and playing [place].  Took the class and didn't feel she caught on quickly. Wonders if it is bc of the ECT memory effects.  Bridge is harder and not playing.  Planning to return to [facility] mid April.   spent the winter in [place] per usual..  No significant depression since here. ECT frequency about 6 weeks.  Gets very anxious the day of the ECT.  ECT consistently for a year.  Best in mood in 7-8 years.  Also the pacemaker helped.  Friends notice the benefit.  Plan:  Option increase in the nortriptyline if needed.  Considered this.  They agree and would like to increase nortriptyline to 75 to try to reduce tendency to depression before the ECT.   03/19/19  Addendum: Here the contents from an email from the nurse for [name]  Hello [name],   I hope you and your family are healthy and well. I took [name] to [facility] for ECT and here are labs from 01/26/19. I am going to also forward them to [facility] for follow up with her LFT's. They are still higher than I would like.  Her lithium level was 0.51 on a daily dose of '300mg'$  qohs x 4 nights- alternating with '450mg'$   qohs x 3 nights. She is not demonstrating any signs of elevated levels, minimal hand tremors, less anxiety and restlessness since her ECT treatment.  Her Nortryptyline level was 140 on '75mg'$  qhs.  I would like to continue her current dosages of both the above meds and recheck her lithium level at her next scheduled ECT of 03/23/19.  Please let me know of any changes you would like to make. Take care, [name] RN Lithium level was 0.4 on the '450mg'$  alt with 300 mg QOD. No changes were made in meds.  11/15/2019 phone call: Telephone call from patient's husband [name] on 11/14/2019  Patient is scheduled for ECT soon for maintenance ECT.  He thinks it is been 2-1/2 months since the last ECT but he is going to check the date for sure.  It is still be done being done at [facility].  He is wondering about skipping this treatment because the patient has continued to be free of depression and seems cognitively clearer as these ECT treatments have been spread out further from each other.  Patient remains on medications as prescribed.  If it is truly been over 2 months since the last ECT then it is reasonable to consider discontinuing ECT.  It is unlikely  that ECT with the frequency of greater than 2 months is significantly helpful at preventing her relapse.  If however the ECT frequency is less than every 2 months it could still be helping to prevent recurrence.  He indicated he would consider this information and discuss it with her ECT team and make a decision.  They are heading to [place] in the next few weeks.  Needs to schedule an appointment with me for follow-up because it is been many months.  He agrees.   02/19/2020 appt, the following noted: Moving to [place] in a few months.  Ready to downsize.   Stopped ECT as discussed and has not been more deprressed.   Walks dog 4 times daily.   Lithium 0.7 on  01/30/20 on lithium 300 mg plus 150 mg on M, W, F Nortriptyline 70 on 75 mg HS. No SE except  tremor.  Balance is not great.  Very happy.   No depression since here.  No mood swings.  Sleep and appetite is OK.  No med changes:  04/03/2020 TC with the following noted: Patient's husband called stating that her tremor was a little worse and she was having some stutter. No evidence of stroke otherwise. First thing would be to check serum lithium level and BMP.  Order written.  Have asked her husband to let us know about the lab to which it should be sent.  04/22/20 TC witht he following noted: Lithium level is not dangerously high but it is has crept up to 1.0 which is higher than desired for her.  Our goal for her is 0.5-0.7.  At the current level it is likely causing side effects so we should reduce the dosage from lithium 300 mg plus 150 mg on M, W, F  TO 300 mg daily.  Meaning drop off the 150 mg capsule.  It will take about 2 weeks for the level to gradually come down to the desired level.  05/08/2020 appt with the following noted: Seen with H and [name] nurse. Moving to [facility] and feels OK about it.  I think I'll be fine.   Pretty good with balance.  Doing yoga and it helps. [name] has cancer. Old dog 51 & 1/2 yo still living. [name]'s concerns when went to [place] for D's BD she had difficulty time with tremor lethargy, more confusion.  Better when go back home. [name] notes using more lorazepam. At times gait is unsteady. Not significantly depressed.  Can be anxious at times.  Eating okay.  Sleeping okay.  Is easily confused under stress. Plan: reduce lithium to 300 mg nightly.  07/14/20 appt with the following noted: Seen with her husband as well as her family nurse. Feeling fine and pleased with that.   Some anxiety over the move to smaller place. [name]'s twin also having heart problems Gene. [name]'s kids came and visited.    Plan: Continue nortriptyline 75 mg, paroxetine 20 mg, lithium 300 mg, Namenda 10 mg twice daily, lorazepam 0.5 mg 1/2-1 3 times daily as needed, mirtazapine  7.5 mg nightly  02/27/2021 appointment with the following noted:  Seen with H and nurse [employer] email from nurse [name] on 02/24/2021 indicated that he had moved into [facility] in May.  Maddie has gradually become more depressed and more anxious using lorazepam as prescribed 3 times daily.  More memory issues.  She has remained engaged and initiating appropriate conversation.  She is very worried about her 82 year old dog who  is in poor health and fears she will become more depressed when the dog dies.  She and her husband do not wish to prefer to pursue further ECT unless absolutely necessary. Likes [facility] so far.   No depression by her report.  Sold big house in Feb.  It worked out well.   Worries about her dog 53 yo will die soon. H agrees she's done well with depression.   Enjoys things and has interests. Plan: Continue Lithium 300 mg daily  Continue nortriptyline 75 mg daily Continue paroxetine 20 mg daily Continue lorazepam 0.5 mg 3 times daily for both anxiety and catatonia Continue vitamin B6 and primidone for tremor Cerefolin NAC 2 daily.  05/13/2021 appointment with the following noted: Happy with [place].  H and she agree is doing well with depression.Kermit Balo socialization. She complains of memory.   Family visited and getting along with Baker Janus better.  H says Baker Janus is acting better.  Goes to dinner and activities together now.  Better relationship with Gene.. Tremor has been OK.   Ativan usually 0.25 mg TID and tolerated. Anxiety managed with this generally. Molly dog health more stable. To [place] before T'giving until April.   Plan: Continue Lithium 300 mg daily  Continue nortriptyline 75 mg daily Continue paroxetine 20 mg daily Continue lorazepam 0.5 mg 3 times daily for both anxiety and catatonia Continue vitamin B6 and primidone for tremor   08/14/2021 appointment with the following noted: Seen with her husband [name] and nurse [name]. Everyone  reports that her mood has been stable.  Her anxiety is manageable with as needed lorazepam.  She is still happy with the transition to [facility].  They are preparing to go to [place] for the winter per usual but may sell their house father there. Tolerating meds without any unusual side effects. Is dealing with grief over the loss of her dog Molly.  03/10/2022 appt noted:  seen with H and nurse Vicie Mutters [name] house. [name] not good phsyically hard to play golf.  Been really hard. He'll be 85 in July.  Some stress scheduling with D's.   22 th wedding necessary this month.   Mood up and down.  Sister in law across the street can be difficult and demeaning.  She feels unsettled.  Some worry. Some back pain limiting activity.   Sleep is ok except with pain awakening.   No SE, except tremor some worse about 3-5 and helped by lorazepam 0.25 mg then No greater than 1 mg lorazepam daily  Patient denies any recent difficulty with anxiety except as noted.  Patient denies difficulty with sleep initiation or maintenance. Denies appetite disturbance.    Patient has some difficulty with concentration.  Patient denies any suicidal ideation.  Usually takes Ativan at night and sleeps well 8 hours.    No current alcohol problems  Saw Dr. Carles Collet last week and no change in neuro meds.  Past Psychiatric Medication Trials: Prozac with lithium and Zyprexa 2.5 to 5 mg, nortriptyline, paroxetine, ,  pramipexole, mirtazapine, Namenda, lorazepam,  Rexulti, Abilify, Vraylar, Latuda 10 mg, lamotrigine.   this loss list is not exhaustive  Review of Systems:  Review of Systems  Constitutional:  Negative for fatigue and unexpected weight change.  Cardiovascular:  Negative for palpitations.  Musculoskeletal:  Positive for gait problem.  Neurological:  Negative for tremors and weakness.       Some balance issues  Psychiatric/Behavioral:  Positive for decreased concentration. Negative for agitation, behavioral problems,  confusion, dysphoric mood, hallucinations, self-injury, sleep disturbance and suicidal ideas. The patient is nervous/anxious. The patient is not hyperactive.    Medications: I have reviewed the patient's current medications.  Current Outpatient Medications  Medication Sig Dispense Refill   ASPIRIN 81 PO Take by mouth.     Calcium Carbonate-Vitamin D (CALCIUM 600/VITAMIN D PO) Take 1 tablet by mouth 2 (two) times daily.     cetirizine (ZYRTEC) 10 MG tablet Take 10 mg by mouth daily as needed for allergies.     Cholecalciferol (VITAMIN D-3) 5000 units TABS Take 5,000 Units by mouth daily with breakfast.      cloNIDine (CATAPRES) 0.1 MG tablet TAKE 1 TABLET BY MOUTH DAILY AS NEEDED.( TAKE BEFORE ECT TREATMENTS) 10 tablet 1   clotrimazole-betamethasone (LOTRISONE) cream Apply 1 application topically 2 (two) times daily. 30 g 0   dexlansoprazole (DEXILANT) 60 MG capsule Take 1 capsule (60 mg total) by mouth daily. 90 capsule 3   dexlansoprazole (DEXILANT) 60 MG capsule Take 1 capsule by mouth daily 90 capsule 3   dexlansoprazole (DEXILANT) 60 MG capsule Take 1 capsule by mouth daily 90 capsule 3   fesoterodine (TOVIAZ) 8 MG TB24 tablet Take 1 tablet (8 mg total) by mouth daily. 90 tablet 3   irbesartan (AVAPRO) 150 MG tablet Take 150 mg by mouth daily.     Krill Oil 500 MG CAPS Take 1 capsule by mouth daily.     lactulose (CHRONULAC) 10 GM/15ML solution TAKE 15 ML BY MOUTH DAILY AS NEEDED FOR MILD CONSTIPATION 1350 mL 1   Lavender Oil 80 MG CAPS Take 160 mg by mouth at bedtime.     levothyroxine (SYNTHROID) 75 MCG tablet TAKE 1 TABLET(75 MCG) BY MOUTH DAILY 90 tablet 2   linaclotide (LINZESS) 72 MCG capsule Take 2 capsules (145 mcg total) by mouth daily before breakfast. 60 capsule 3   linaclotide (LINZESS) 72 MCG capsule Take 1 capsule by mouth daily before breakfast 30 capsule 2   Methylfol-Algae-B12-Acetylcyst (CEREFOLIN NAC) 6-90.314-2-600 MG TABS TAKE 1 CAPLET BY MOUTH THREE TIMES A DAY  (Patient taking differently: 2 (two) times daily.) 270 tablet 0   Multiple Vitamins-Minerals (MULTIVITAMIN WITH MINERALS) tablet Take 1 tablet by mouth daily.     Multiple Vitamins-Minerals (PRESERVISION AREDS 2 PO) Take 1 capsule by mouth in the morning and at bedtime.     nystatin-triamcinolone (MYCOLOG II) cream Apply 1 application topically 2 (two) times daily. (Patient taking differently: Apply 1 application. topically as needed.) 30 g 0   Omega-3 Fatty Acids (OMEGA 3 PO) Take 1 capsule by mouth at bedtime.     OVER THE COUNTER MEDICATION Take 1 tablet by mouth 2 (two) times daily. *Cocavia*     polyethylene glycol (MIRALAX / GLYCOLAX) 17 g packet Take 17 g by mouth as needed.     Pyridoxine HCl (VITAMIN B-6) 500 MG tablet Take 500 mg by mouth 2 (two) times daily.     rosuvastatin (CRESTOR) 40 MG tablet TAKE 1 TABLET (40 MG) BY MOUTH DAILY 90 tablet 3   sennosides-docusate sodium (SENOKOT-S) 8.6-50 MG tablet Take 2 tablets by mouth at bedtime.     sucralfate (CARAFATE) 1 GM/10ML suspension Take 10 mLs (1 g total) by mouth 4 (four) times daily -  with meals and at bedtime. 420 mL 1   traMADol (ULTRAM) 50 MG tablet TAKE 1 TABLET EVERY 4 TO 6 HOURS AS NEEDED FOR PAIN. 15 tablet 0   valACYclovir (VALTREX) 1000 MG tablet Take 1 tablet  by mouth as needed. Use as directed     valACYclovir (VALTREX) 1000 MG tablet Take 2 tablets by mouth at onset, then 2 tablets 12 hours later 30 tablet 4   vitamin B-12 (CYANOCOBALAMIN) 1000 MCG tablet Take 1,000 mcg by mouth at bedtime.     lithium carbonate 300 MG capsule TAKE 1 CAPSULE(300 MG) BY MOUTH AT BEDTIME 90 capsule 1   LORazepam (ATIVAN) 0.5 MG tablet TAKE 1/2 TO 1 TABLET BY MOUTH EVERY NIGHT AT BEDTIME AND 1 TABLET EVERY 8 HOURS AS NEEDED FOR ANXIETY 180 tablet 1   memantine (NAMENDA) 10 MG tablet Take 1 tablet (10 mg total) by mouth 2 (two) times daily. 180 tablet 1   mirtazapine (REMERON) 7.5 MG tablet Take 1 tablet (7.5 mg total) by mouth at bedtime.  90 tablet 3   nortriptyline (PAMELOR) 75 MG capsule Take 1 capsule (75 mg total) by mouth at bedtime. 90 capsule 1   PARoxetine (PAXIL) 20 MG tablet Take 1 tablet (20 mg total) by mouth daily. 90 tablet 1   primidone (MYSOLINE) 50 MG tablet TAKE 2 TABLETS BY MOUTH EVERY MORNING AND 1 TABLET EVERY EVENING 270 tablet 1   No current facility-administered medications for this visit.    Medication Side Effects: Other: tremor  stable.  No worse.  Allergies:  Allergies  Allergen Reactions   Propranolol Other (See Comments)    Low blood pressure   Brexpiprazole Other (See Comments)    Aphasia and catatonia    Past Medical History:  Diagnosis Date   Anxiety    Arthritis    "hips, spine" (03/17/2018)   Bipolar II disorder (HCC)    Chronic bronchitis (HCC)    Chronic lower back pain    Chronic right hip pain    CKD (chronic kidney disease), stage II    Esophagitis, erosive    GAD (generalized anxiety disorder)    GERD (gastroesophageal reflux disease)    Headache    "maybe monthly" (03/17/2018))   Heart murmur, systolic    History of adenomatous polyp of colon    08-04-2016  tubular adenoma   History of blood transfusion 12/2017   "related to vascular hematoma"   History of electroconvulsive therapy    at [facility]--  started 04-15-2015 to 11-17-2016  total greater than 40 times   History of hiatal hernia    Hyperlipidemia    Hypertension    Hypothyroidism    Internal carotid artery stenosis, bilateral    per last duplex 05-01-2014  bilateral ICA 40-59%   Major depression, chronic    ECT treatments extensive and multiple started 07/ 2016   Memory loss    "both short and long-term; needs frequent reminders to follow instrucitons" (05/16/2017)   Migraines    "none in years" (03/17/2018)   OSA (obstructive sleep apnea)    per study 06/ 2012 moderate OSA  ; "refuses to wear masks" (03/17/2018)   Osteoporosis    Pulmonary nodule    monitored by pcp   S/P placement of cardiac pacemaker  03/17/18 [facility]  03/18/2018    Family History  Problem Relation Age of Onset   Heart attack Father 20       deceased   Hypertension Father    Heart disease Father    Breast cancer Paternal 68        Age unknown   Breast cancer Paternal Aunt        Age 103's   Colon cancer Neg Hx  Social History   Socioeconomic History   Marital status: Married    Spouse name: Dr. Lyla Son   Number of children: 2   Years of education: Not on file   Highest education level: Not on file  Occupational History   Occupation: housewife    Employer: UNEMPLOYED  Tobacco Use   Smoking status: Former    Packs/day: 2.00    Years: 15.00    Pack years: 30.00    Types: Cigarettes    Quit date: 01/13/1971    Years since quitting: 51.1   Smokeless tobacco: Never  Vaping Use   Vaping Use: Never used  Substance and Sexual Activity   Alcohol use: Yes    Alcohol/week: 0.0 standard drinks    Comment: occassional   Drug use: Never   Sexual activity: Not Currently    Comment: intercourse age 44, sexual partners less than 5  Other Topics Concern   Not on file  Social History Narrative   Not on file   Social Determinants of Health   Financial Resource Strain: Not on file  Food Insecurity: Not on file  Transportation Needs: Not on file  Physical Activity: Not on file  Stress: Not on file  Social Connections: Not on file  Intimate Partner Violence: Not on file    Past Medical History, Surgical history, Social history, and Family history were reviewed and updated as appropriate.   Please see review of systems for further details on the patient's review from today.   Objective:   Physical Exam:  LMP  (LMP Unknown)   Physical Exam Constitutional:      General: She is not in acute distress.    Appearance: She is well-developed.  Musculoskeletal:        General: No deformity.  Neurological:     Mental Status: She is alert and oriented to person, place, and time.     Motor: No  tremor.     Coordination: Coordination abnormal.     Comments: Cane use  Psychiatric:        Attention and Perception: She is attentive.        Mood and Affect: Mood is anxious. Mood is not depressed. Affect is not labile, blunt, angry or inappropriate.        Speech: Speech is not rapid and pressured or slurred.        Behavior: Behavior normal. Behavior is not slowed or withdrawn.        Thought Content: Thought content normal. Thought content is not delusional. Thought content does not include homicidal or suicidal ideation. Thought content does not include suicidal plan.        Cognition and Memory: She does not exhibit impaired recent memory.        Judgment: Judgment normal.     Comments: Insight intact. No auditory or visual hallucinations.  meticulous appearance bright affect Mood and cognitive status are stable. Good fund of knowledge and memory seems better Gait    Lab Review:     Component Value Date/Time   NA 140 03/05/2022 1139   NA 136 04/20/2017 1151   K 4.3 03/05/2022 1139   CL 105 03/05/2022 1139   CO2 29 03/05/2022 1139   GLUCOSE 81 03/05/2022 1139   BUN 22 03/05/2022 1139   BUN 24 04/20/2017 1151   CREATININE 0.75 03/05/2022 1139   CALCIUM 9.5 03/05/2022 1139   PROT 6.6 03/05/2022 1139   PROT 6.2 04/20/2017 1151   ALBUMIN 3.9 08/08/2017 1615  ALBUMIN 3.9 04/20/2017 1151   AST 56 (H) 03/05/2022 1139   ALT 103 (H) 03/05/2022 1139   ALKPHOS 46 08/08/2017 1615   BILITOT 0.3 03/05/2022 1139   BILITOT <0.2 04/20/2017 1151   GFRNONAA 50 (L) 07/10/2020 1021   GFRAA 58 (L) 07/10/2020 1021       Component Value Date/Time   WBC 4.5 03/05/2022 1139   RBC 3.95 03/05/2022 1139   HGB 12.9 03/05/2022 1139   HGB 10.2 (L) 04/20/2017 1151   HCT 38.2 03/05/2022 1139   HCT 29.6 (L) 04/20/2017 1151   PLT 220 03/05/2022 1139   PLT 385 (H) 04/20/2017 1151   MCV 96.7 03/05/2022 1139   MCV 89 04/20/2017 1151   MCH 32.7 03/05/2022 1139   MCHC 33.8 03/05/2022 1139    RDW 11.8 03/05/2022 1139   RDW 13.9 04/20/2017 1151   LYMPHSABS 1,400 03/05/2022 1139   LYMPHSABS 1.5 04/20/2017 1151   MONOABS 0.5 03/09/2017 1232   EOSABS 90 03/05/2022 1139   EOSABS 0.3 04/20/2017 1151   BASOSABS 41 03/05/2022 1139   BASOSABS 0.0 04/20/2017 1151    Lithium Lvl  Date Value Ref Range Status  03/05/2022 0.5 (L) 0.6 - 1.2 mmol/L Final  Nortriptyline level 51 on 50 mg nightly with paroxetine 20 mg daily  07/10/2020 lithium level 0.6 at [facility] office and nortriptyline level 130 on the current dosages of lithium 300 mg nightly and nortriptyline 75 mg nightly  02/20/2021 labs lithium 0.6 on 300 mg nightly.  Nortriptyline level 49 which is lower than expected on 75 mg nightly along with paroxetine 20 mg daily.  No results found for: PHENYTOIN, PHENOBARB, VALPROATE, CBMZ   .res Assessment: Plan:    Bipolar II disorder ([place]) - Plan: lithium carbonate 300 MG capsule, mirtazapine (REMERON) 7.5 MG tablet, nortriptyline (PAMELOR) 75 MG capsule, PARoxetine (PAXIL) 20 MG tablet  Generalized anxiety disorder - Plan: LORazepam (ATIVAN) 0.5 MG tablet  Catatonia - Plan: LORazepam (ATIVAN) 0.5 MG tablet  Mild cognitive impairment - Plan: memantine (NAMENDA) 10 MG tablet  Lithium-induced tremor  Essential tremor - Plan: primidone (MYSOLINE) 50 MG tablet  Lithium use    History of lithium toxicity repeatedly.  Greater than 50% of 50 min face to face time with patient her nurse and her husband was spent on counseling and coordination of care.  Her case has been highly complex and treatment resistant at times.  Her severe treatment resistant bipolar depression is currently in remission.  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  During the severe depression she has catatonia.  She had required longterm ECT.  A previous episode of depression approximately 10 years ago stayed in remission on Paxil and nortriptyline and lithium for a  period of years until the lithium level was decreased due to tremor.  Now we are maintaining an adequate lithium level.  She has had several episodes of lithium toxicity with hospitalization.    From records it appears her last ECT was in September 2020.  Her depression has remained in remission since that time. Catatonia is resolved.  Depression and anxiety much improved.  Memory seems to be stable if not better.    She seems to be tolerating the meds well other than the lithium tremor which is being fairly  managed with primidone.  She is continuing under the care of her neurologist Dr. Carles Collet.  The nortriptyline was increased to 75 to try to reduce tendency to depression in February 2020.  Levels are noted.  02/20/2021 labs lithium 0.6 on 300 mg nightly.  Nortriptyline level 49 which is lower than expected on 75 mg nightly along with paroxetine 20 mg daily.  Check labs every 4-6 mos.  Lithium level 0.5 on 03/05/22 on 300 mg HS is ideal with little risk toxicity bc low normal dose.  Disc the frequency of lithium labs. Labs include lithium level, nortriptyline level, serum BMP, TSH, from June 2023 are WNL except mild elevations in liver enzymes.  Low vitamin D can be associated with cognitive problems and more risk of depression.  Lithium level goals are between 0.5 and 0.7.  She has a history of lithium toxicity when she was taking chlorthalidone with the lithium.   Discussed signs and symptoms of lithium toxicity.  Discussed the risk of increasing depression if the lithium level gets too low.  Lithium tremor is overall little better with the reduction in lithium. She cannot tolerate higher doses of lithium.  Memory is stable compared with prior appointments.  No new concerns expressed by her family or her nurse.  Overall memory appears to be stable and she has reasonable recall for recent family events.  There remains some concern about drug interactions between paroxetine and nortriptyline and given  her age we do not want the nortriptyline levels higher than necessary .  However the nortriptyline level was not high. Disc risk polypharmacy  Continue Lithium 300 mg daily  Continue nortriptyline 75 mg daily Continue paroxetine 20 mg daily Continue lorazepam 0.5 mg 3 times daily for both anxiety and catatonia Continue vitamin B6 and primidone for tremor Continue mirtazapine 7.5 mg HS for depression and sleep  This appointment was 50 minutes.   Follow-up 4 mos  Lynder Parents MD, DFAPA Please see After Visit Summary for patient specific instructions.  Future Appointments  Date Time Provider [place]  03/24/2022 10:00 AM [facility] NURSE MJB-MJB MJB  07/12/2022  1:00 PM Cottle, Billey Co., MD CP-CP None      No orders of the defined types were placed in this encounter.      -------------------------------

## 2022-03-11 ENCOUNTER — Other Ambulatory Visit (HOSPITAL_BASED_OUTPATIENT_CLINIC_OR_DEPARTMENT_OTHER): Payer: Self-pay

## 2022-03-12 DIAGNOSIS — M1611 Unilateral primary osteoarthritis, right hip: Secondary | ICD-10-CM | POA: Diagnosis not present

## 2022-03-12 DIAGNOSIS — M25551 Pain in right hip: Secondary | ICD-10-CM | POA: Diagnosis not present

## 2022-03-17 ENCOUNTER — Other Ambulatory Visit (HOSPITAL_BASED_OUTPATIENT_CLINIC_OR_DEPARTMENT_OTHER): Payer: Self-pay

## 2022-03-17 ENCOUNTER — Other Ambulatory Visit: Payer: Self-pay | Admitting: Gastroenterology

## 2022-03-18 ENCOUNTER — Other Ambulatory Visit: Payer: Self-pay | Admitting: Gastroenterology

## 2022-03-18 ENCOUNTER — Other Ambulatory Visit (HOSPITAL_BASED_OUTPATIENT_CLINIC_OR_DEPARTMENT_OTHER): Payer: Self-pay

## 2022-03-18 MED ORDER — LINACLOTIDE 72 MCG PO CAPS
72.0000 ug | ORAL_CAPSULE | Freq: Two times a day (BID) | ORAL | 3 refills | Status: DC
  Filled 2022-03-18 – 2022-03-22 (×2): qty 60, 30d supply, fill #0
  Filled 2022-04-15: qty 60, 30d supply, fill #1
  Filled 2022-05-14: qty 60, 30d supply, fill #2
  Filled 2022-06-12: qty 60, 30d supply, fill #3

## 2022-03-18 MED ORDER — LINACLOTIDE 72 MCG PO CAPS
ORAL_CAPSULE | ORAL | 2 refills | Status: DC
Start: 2022-03-18 — End: 2022-03-25
  Filled 2022-03-18: qty 30, fill #0

## 2022-03-22 ENCOUNTER — Other Ambulatory Visit (HOSPITAL_BASED_OUTPATIENT_CLINIC_OR_DEPARTMENT_OTHER): Payer: Self-pay

## 2022-03-23 ENCOUNTER — Other Ambulatory Visit: Payer: Self-pay | Admitting: Internal Medicine

## 2022-03-24 ENCOUNTER — Ambulatory Visit (INDEPENDENT_AMBULATORY_CARE_PROVIDER_SITE_OTHER): Payer: Medicare Other | Admitting: Internal Medicine

## 2022-03-24 DIAGNOSIS — M1612 Unilateral primary osteoarthritis, left hip: Secondary | ICD-10-CM | POA: Diagnosis not present

## 2022-03-24 DIAGNOSIS — R413 Other amnesia: Secondary | ICD-10-CM

## 2022-03-24 DIAGNOSIS — E7800 Pure hypercholesterolemia, unspecified: Secondary | ICD-10-CM | POA: Diagnosis not present

## 2022-03-24 DIAGNOSIS — Z0000 Encounter for general adult medical examination without abnormal findings: Secondary | ICD-10-CM | POA: Diagnosis not present

## 2022-03-24 DIAGNOSIS — Z9882 Breast implant status: Secondary | ICD-10-CM

## 2022-03-24 DIAGNOSIS — Z8719 Personal history of other diseases of the digestive system: Secondary | ICD-10-CM

## 2022-03-24 DIAGNOSIS — E039 Hypothyroidism, unspecified: Secondary | ICD-10-CM

## 2022-03-24 DIAGNOSIS — K5909 Other constipation: Secondary | ICD-10-CM | POA: Diagnosis not present

## 2022-03-24 DIAGNOSIS — M810 Age-related osteoporosis without current pathological fracture: Secondary | ICD-10-CM | POA: Diagnosis not present

## 2022-03-24 DIAGNOSIS — N3281 Overactive bladder: Secondary | ICD-10-CM

## 2022-03-24 DIAGNOSIS — Z8659 Personal history of other mental and behavioral disorders: Secondary | ICD-10-CM | POA: Diagnosis not present

## 2022-03-24 DIAGNOSIS — I2510 Atherosclerotic heart disease of native coronary artery without angina pectoris: Secondary | ICD-10-CM | POA: Diagnosis not present

## 2022-03-24 DIAGNOSIS — Z950 Presence of cardiac pacemaker: Secondary | ICD-10-CM | POA: Diagnosis not present

## 2022-03-24 DIAGNOSIS — I10 Essential (primary) hypertension: Secondary | ICD-10-CM

## 2022-03-25 NOTE — Progress Notes (Signed)
Annual Wellness Visit     Patient: Meghan Oliver, Female    DOB: June 15, 1941, 81 y.o.   MRN: [id number] Visit Date: 03/24/2022   Subjective    Meghan Oliver is a 81 y.o. Female who presents today for her Annual Wellness Visit.  HPI She also presents for health maintenance exam and evaluation of medical issues.  She has a history of hypertension, carotid disease followed by cardiology, history of major depression, irritable bowel syndrome with constipation, GE reflux, hyperlipidemia and hypothyroidism.  Past medical history: History of bilateral breast implants.  History of obstructive sleep apnea.  Left lower lobe pneumonia 2015.  Appendectomy 1971.  History of bilateral tubal ligation and left ovarian cystectomy.  Longstanding history of depression starting in her 44s after the birth of her first child.  She has a pacemaker and is followed by cardiology.  GYN care is done through nurse practitioner.  Family history: She only has 1 sibling, a brother who has been diagnosed with dementia.  Father with history of bipolar disorder who died at age 79 of an MI.  Mother died with history of COPD.  Social history: She is married.  Husband is retired Copywriter, advertising.  Patient quit smoking in 1972.  She has 2 adult daughters 1 who lives in [place] and 1 who lives in [place].   Lithium and nortriptyline levels requested by [name] reviewed and will be faxed to him.  Her lithium level is slightly low at 0.5 and her nortriptyline level is 106 and within normal limits.  She has a very high HDL of 118 making her total cholesterol slightly high at 211 but her LDL is excellent at 79 and triglycerides are normal at 49.  TSH is normal.  BUN and creatinine are normal as is glucose.  Electrolytes are normal.  She has chronically elevated liver functions.  SGOT is 56 and SGPT is 103.  History of osteopenia.  Has osteoporosis of left hip.  Lowest T score on bone density in left hip -2.6 in  2022.   She is scheduled for right hip arthroplasty May 18, 2022.  She is accompanied today by Randol Kern, RN who does an excellent job navigating patient's complex medical history.  Patient and her husband are now residing at [facility]  She has adjusted fairly well.  Unfortunately her dog passed away recently.  Dog had been in the family for many years.  Patient has history of Bipolar I  disorder and is followed by [name].  She is doing well.  Has seen Dr. Paulla Dolly regarding bunion and mycotic nail infection of 1 nail.  Conservative care recommended.  No surgery was recommended for bunion.  No treatment needed for mycotic infection of 1 nail.  History of age-related macular degeneration bilaterally seen by Dr. Posey Pronto.  Had colonoscopy in 2018 in [place] when she was having severe constipation.  1 noncancerous polyp found.  Had upper endoscopy by Dr. Carlean Purl in February 2022.  Was found to have hiatal hernia and esophageal stricture that was dilated.      Patient Care Team: [name], MD as PCP - General (Internal Medicine) Larey Dresser, MD as PCP - Cardiology (Cardiology) Evans Lance, MD as PCP - Electrophysiology (Cardiology) Hillary Bow, MD (Cardiology) Cottle, Billey Co., MD (Psychiatry) Bennetta Laos, MD (Inactive) (Obstetrics and Gynecology)  Review of Systems see above-has daily hip pain due to end-stage osteoarthritis   Objective    Vitals: Reviewed  Physical Exam Blood pressure 130/70, pulse  pulse oximetry 96% on room air Skin: Warm and dry.  No cervical adenopathy or carotid bruits.  Chest clear to auscultation.  Cardiac exam: Regular rate and rhythm.  No ectopy appreciated.  Abdomen soft nondistended without hepatosplenomegaly masses or tenderness.  Pelvic exam deferred.  No lower extremity pitting edema.  Patient is alert and cooperative.  No gross focal deficits on brief neurological exam.   Most recent functional status  assessment:    03/25/2022    9:27 AM  In your present state of health, do you have any difficulty performing the following activities:  Hearing? 0  Vision? 0  Difficulty concentrating or making decisions? 1  Walking or climbing stairs? 1  Comment Hip issues  Dressing or bathing? 0  Doing errands, shopping? 0  Preparing Food and eating ? N  Using the Toilet? N  Managing your Medications? N  Managing your Finances? N   Most recent fall risk assessment:    02/23/2021    2:50 PM  [place] in the past year? 1  Number falls in past yr: 1  Injury with Fall? 1  Risk for fall due to : Impaired balance/gait  Follow up Falls evaluation completed    Most recent depression screenings:    03/25/2022    9:29 AM 02/23/2021    2:50 PM  PHQ 2/9 Scores  PHQ - 2 Score 1 2  PHQ- 9 Score  2   Most recent cognitive screening:     No data to display             Assessment & Plan   End-stage osteoarthritis left hip-scheduled for left hip arthroplasty August 15.  History of osteoporosis left hip based on bone density study  History of bipolar  disorder followed by [name] and doing well  Longstanding history of depression currently on Pamelor and Paxil  History of esophageal stricture dilated by Dr. Carlean Purl in 2022 and treated with Dexilant  History of chronic constipation treated with MiraLAX  Hyperlipidemia treated with Crestor 40 mg daily  Hypothyroidism treated with thyroid replacement medication and stable  Mild memory loss treated with Namenda per [name]  History of overactive bladder treated with Toviaz  History of atrial paced rhythm with permanent pacemaker insertion due to sinus node dysfunction in 2019.  Followed closely by cardiology.    Plan: Her medical issues are stable at this time and her psychiatric issues are also stable at this time.  Labs reviewed.  She has excellent specialist looking after her.  I think she is medically stable for  having left hip arthroplasty in August.  She usually is seen here once a year.        Annual wellness visit done today including the all of the following: Reviewed patient's Family Medical History Reviewed and updated list of patient's medical providers Assessment of cognitive impairment was done Assessed patient's functional ability Established a written schedule for health screening [place] Completed and Reviewed  Discussed health benefits of physical activity, and encouraged her to engage in regular exercise appropriate for her age and condition.       IElby Showers, MD, have reviewed all documentation for this visit. The documentation on 04/18/22 for the exam, diagnosis, procedures, and orders are all accurate and complete.   Angus Seller, CMA

## 2022-03-30 ENCOUNTER — Telehealth: Payer: Self-pay | Admitting: *Deleted

## 2022-03-30 ENCOUNTER — Other Ambulatory Visit (HOSPITAL_BASED_OUTPATIENT_CLINIC_OR_DEPARTMENT_OTHER): Payer: Self-pay

## 2022-03-30 ENCOUNTER — Encounter (HOSPITAL_BASED_OUTPATIENT_CLINIC_OR_DEPARTMENT_OTHER): Payer: Self-pay | Admitting: Pharmacist

## 2022-03-30 ENCOUNTER — Encounter: Payer: Self-pay | Admitting: Internal Medicine

## 2022-03-30 NOTE — Telephone Encounter (Signed)
I have sent a message to Dr. Shirlee Latch office to reach out to the pt with an appt for pre op clearance, see notes from Ronie Spies, [facility]

## 2022-03-30 NOTE — Telephone Encounter (Signed)
I s/w DPR Dr. Victorino Dike, pt's husband and informed that pt will need an appt for pre op clearance. Pt agreeable to plan of care for in office tomorrow 03/31/22 @ 11:40 with Dr. Shirlee Latch. Once the pt has been cleared we will fax over clearance notes. I assured the Irby's that I will update Dr. Charlann Boxer that the pt has appt tomorrow with Dr. Shirlee Latch.

## 2022-03-31 ENCOUNTER — Other Ambulatory Visit (HOSPITAL_BASED_OUTPATIENT_CLINIC_OR_DEPARTMENT_OTHER): Payer: Self-pay

## 2022-03-31 ENCOUNTER — Encounter (HOSPITAL_COMMUNITY): Payer: Self-pay | Admitting: Cardiology

## 2022-03-31 ENCOUNTER — Ambulatory Visit (HOSPITAL_COMMUNITY)
Admission: RE | Admit: 2022-03-31 | Discharge: 2022-03-31 | Disposition: A | Discharge status: Home / Self Care | Payer: Medicare Other | Source: Ambulatory Visit | Attending: Cardiology | Admitting: Cardiology

## 2022-03-31 VITALS — BP 140/70 | HR 61 | Wt 98.6 lb

## 2022-03-31 DIAGNOSIS — F319 Bipolar disorder, unspecified: Secondary | ICD-10-CM | POA: Insufficient documentation

## 2022-03-31 DIAGNOSIS — F0393 Unspecified dementia, unspecified severity, with mood disturbance: Secondary | ICD-10-CM | POA: Diagnosis not present

## 2022-03-31 DIAGNOSIS — R011 Cardiac murmur, unspecified: Secondary | ICD-10-CM | POA: Diagnosis not present

## 2022-03-31 DIAGNOSIS — R001 Bradycardia, unspecified: Secondary | ICD-10-CM | POA: Insufficient documentation

## 2022-03-31 DIAGNOSIS — I10 Essential (primary) hypertension: Secondary | ICD-10-CM | POA: Diagnosis not present

## 2022-03-31 DIAGNOSIS — R0989 Other specified symptoms and signs involving the circulatory and respiratory systems: Secondary | ICD-10-CM | POA: Diagnosis not present

## 2022-03-31 DIAGNOSIS — K219 Gastro-esophageal reflux disease without esophagitis: Secondary | ICD-10-CM | POA: Insufficient documentation

## 2022-03-31 DIAGNOSIS — E785 Hyperlipidemia, unspecified: Secondary | ICD-10-CM | POA: Diagnosis not present

## 2022-03-31 DIAGNOSIS — I2510 Atherosclerotic heart disease of native coronary artery without angina pectoris: Secondary | ICD-10-CM | POA: Insufficient documentation

## 2022-03-31 DIAGNOSIS — Z01818 Encounter for other preprocedural examination: Secondary | ICD-10-CM | POA: Insufficient documentation

## 2022-03-31 DIAGNOSIS — I5022 Chronic systolic (congestive) heart failure: Secondary | ICD-10-CM

## 2022-03-31 DIAGNOSIS — I6529 Occlusion and stenosis of unspecified carotid artery: Secondary | ICD-10-CM | POA: Insufficient documentation

## 2022-03-31 NOTE — Progress Notes (Signed)
Patient ID: ANGENI Oliver, female   DOB: 11-24-1940, 81 y.o.   MRN: [id number] PCP: Dr. Renold Genta Cardiology: Dr. Aundra Dubin  81 y.o. with history of bipolar disorder, carotid stenosis, HTN, CAD, and hyperlipidemia presents for followup of CAD. She had DES to pLAD in 8/18.  She has severe depression and has had multiple ECT treatments at [facility]. Echo in 6/19 showed EF 65-70% with mild-moderate AI and mild to moderate MR.   For several years, she had episodes of burning in her lower neck , sometimes radiating up.  These episodes had no particular trigger (not meals or exercise).  In the past, her family had thought they represented GERD and she had been on a PPI and H-2 blocker as well as Mylanta prn.  She had a particularly bad episode in 7/18 and went to the ER.  She had a coronary CTA that showed extensive calcification in the proximal to mid LAD, no definite severe stenosis but cannot rule out.  The study quality was not good enough to interpret by FFR.  Therefore, I took her for coronary angiography in 8/18.  This showed a 90% proximal LAD stenosis that was treated with DES.  Unfortunately, coronary intervention did not stop her symptoms.  She continued to have the same periodic burning sensation in chest and lower neck, not exertional.  This finally seems to have mostly stopped.   She developed symptomatic bradycardia with sinus node dysfunction with junctional escape rhythm.  St Jude PPM was placed in 6/19 with removal of her portacath.   Atypical chest pain in 9/19, Cardiolite showed no ischemia.   Patient returns for followup of CAD.  She has been doing well from a cardiac standpoint.  No chest pain. No significant exertional dyspnea.  She is mainly limited by severe right hip OA with pain when she walks. She is able to make it up a flight of stairs though she is in pain doing it.  She cannot walk long distances due to hip pain. She uses a cane. She is contemplating left THR.   ECG (personally reviewed):  a-paced, poor RWP.   Labs (4/16): K 3.3, creatinine 0.76 Labs (7/18): K 4.4, creatinine 1.02 Labs (8/18): LDL 81, HDL 119, creatinine 0.99, hgb 10.8 Labs (10/18): K 3.6, creatinine 0.77, hgb 10 Labs (7/19): creatinine 0.96 Labs (6/23): creatinine 0.75, LDL 79, HDL 118  PMH: 1. GERD 2. Bipolar disorder: On lithium. History of ECT.  3. HTN 4. Tremor 5. Hyperlipidemia 6. Carotid stenosis: Carotid dopplers (7/14) with 40-59% bilateral ICA stenosis.  Carotid dopplers (7/15) with 40-59% BICA stenosis.  - Carotid dopplers (8/18): Mild BICA stenosis.  - Carotid dopplers (8/19): Mild BICA stenosis.  7. CAD: Coronary calcium seen on CT.   - ETT (7/13) with 5'45" exercise, 71% MPHR (wanted to stop), no ischemic ECG changes but did not reach target heart rate.  - Lexiscan Cardiolite (4/16) with EF 76%, no ischemia/infarction.  - Coronary CTA (7/18): Interpretation limited by artifact.  Extensive calcification in the proximal to mid LAD, possible 50% stenosis but difficult to quantify due to artifact.   - LHC (8/18): 90% proximal LAD stenosis => DES placed.  - Cardiolite (9/18): EF 75%, low risk study with soft tissue attenuation, no ischemia.  - Echo (6/19): EF 65-70%, mild LVH, mild-moderate AI, mild-moderate MR, severe LAE.  - Cardiolite (9/19): EF 70%, apical thinning, no ischemia.  8. Aortic valve disorder:  Echo (2/12) with EF 55-60%, mild AI and MR, PA systolic pressure 36  mmHg.  Echo (12/14) with EF 65-70%, mild LVH, mild AI, very mild AS, PA systolic pressure 44 mmHg.  - Echo (8/18) with EF 65-70%, mild AS, moderate aortic insufficiency.  - Echo (6/19) with mild-moderate AI.  9. Lung nodules: CT chest 12/14 stable, no followup recommended.  10. Sinus node dysfunction with junctional escape noted in 6/19: She had St Jude PPM placed.   11. Syncope: ?orthostatic/dehydration, 8/18.  12. OSA: Mild to moderate on 10/18 sleep study.  13. Dementia: On Namenda.  14. osteoarthritis  SH: Married  to Meghan Oliver, quit smoking in 1972, 2 daughters.  FH: Father with CAD. Brother with PPM.   ROS: All systems reviewed and negative except as per HPI.   Current Outpatient Medications  Medication Sig Dispense Refill   ASPIRIN 81 PO Take by mouth.     Calcium Carbonate-Vitamin D (CALCIUM 600/VITAMIN D PO) Take 1 tablet by mouth 2 (two) times daily.     cetirizine (ZYRTEC) 10 MG tablet Take 10 mg by mouth daily as needed for allergies.     Cholecalciferol (VITAMIN D3) 50 MCG (2000 UT) TABS Take 2,000 Units by mouth in the morning.     dexlansoprazole (DEXILANT) 60 MG capsule Take 1 capsule by mouth daily 90 capsule 3   fesoterodine (TOVIAZ) 8 MG TB24 tablet Take 1 tablet (8 mg total) by mouth daily. 90 tablet 3   Krill Oil 500 MG CAPS Take 1 capsule by mouth daily.     Lavender Oil 80 MG CAPS Take 160 mg by mouth at bedtime.     levothyroxine (SYNTHROID) 75 MCG tablet TAKE 1 TABLET(75 MCG) BY MOUTH DAILY 90 tablet 2   linaclotide (LINZESS) 72 MCG capsule Take 1 capsule (72 mcg total) by mouth in the morning and at bedtime. 60 capsule 3   lithium carbonate 300 MG capsule TAKE 1 CAPSULE(300 MG) BY MOUTH AT BEDTIME 90 capsule 1   LORazepam (ATIVAN) 0.5 MG tablet TAKE 1/2 TO 1 TABLET BY MOUTH EVERY NIGHT AT BEDTIME AND 1 TABLET EVERY 8 HOURS AS NEEDED FOR ANXIETY 180 tablet 1   memantine (NAMENDA) 10 MG tablet Take 1 tablet (10 mg total) by mouth 2 (two) times daily. 180 tablet 1   mirtazapine (REMERON) 7.5 MG tablet Take 1 tablet (7.5 mg total) by mouth at bedtime. 90 tablet 3   Multiple Vitamins-Minerals (MULTIVITAMIN WITH MINERALS) tablet Take 1 tablet by mouth daily.     Multiple Vitamins-Minerals (PRESERVISION AREDS 2 PO) Take 1 capsule by mouth in the morning and at bedtime.     nortriptyline (PAMELOR) 75 MG capsule Take 1 capsule (75 mg total) by mouth at bedtime. 90 capsule 1   PARoxetine (PAXIL) 20 MG tablet Take 1 tablet (20 mg total) by mouth daily. 90 tablet 1   polyethylene glycol  (MIRALAX / GLYCOLAX) 17 g packet Take 17 g by mouth as needed.     primidone (MYSOLINE) 50 MG tablet TAKE 2 TABLETS BY MOUTH EVERY MORNING AND 1 TABLET EVERY EVENING 270 tablet 1   Pyridoxine HCl (VITAMIN B-6) 500 MG tablet Take 500 mg by mouth 2 (two) times daily.     rosuvastatin (CRESTOR) 40 MG tablet TAKE 1 TABLET (40 MG) BY MOUTH DAILY 90 tablet 3   sennosides-docusate sodium (SENOKOT-S) 8.6-50 MG tablet Take 2 tablets by mouth at bedtime.     traMADol (ULTRAM) 50 MG tablet TAKE 1 TABLET EVERY 4 TO 6 HOURS AS NEEDED FOR PAIN. 15 tablet 0   valACYclovir (VALTREX)  1000 MG tablet Take 2 tablets by mouth at onset, then 2 tablets 12 hours later 30 tablet 4   vitamin B-12 (CYANOCOBALAMIN) 1000 MCG tablet Take 1,000 mcg by mouth at bedtime.     No current facility-administered medications for this encounter.    BP 140/70   Pulse 61   Wt 44.7 kg (98 lb 9.6 oz)   LMP  (LMP Unknown)   SpO2 96%   BMI 20.61 kg/m  General: NAD Neck: No JVD, no thyromegaly or thyroid nodule.  Lungs: Clear to auscultation bilaterally with normal respiratory effort. CV: Nondisplaced PMI.  Heart regular S1/S2, no S3/S4, 2/6 early SEM RUSB.  No peripheral edema.  Bilateral carotid bruits.  Normal pedal pulses.  Abdomen: Soft, nontender, no hepatosplenomegaly, no distention.  Skin: Intact without lesions or rashes.  Neurologic: Alert and oriented x 3.  Psych: Normal affect. Extremities: No clubbing or cyanosis.  HEENT: Normal.   Assessment/Plan: 1. Symptomatic bradycardia:  Now s/p St Jude PPM.    2. Carotid stenosis: Mild disease only on last carotid dopplers.  Bruits on exam.  - Will arrange for carotid dopplers.  3. CAD:  She had a concerning coronary CTA with extensive calcified plaque in the proximal to mid LAD. Due to artifact, hard to tell degree of stenosis and the study was not good enough to get FFR from it.  I was concerned that her symptoms could represent angina given the amount of coronary plaque  she has => coronary angiography was done, showing tight proximal LAD stenosis (8/18).  This was treated with DES.  PCI did not immediately help her symptoms (neck and chest burning).  This makes me suspect that her symptoms were GERD-related.  She had a Cardiolite post-PCI in 9/18 that showed no evidence for ischemia. Cardiolite in 9/19 also showed no ischemia.  No recent chest pain.  - Continue ASA 81 and statin.  4. Aortic valve disorder: Mild to moderate AI on last echo in 6/19. She has an aortic area murmur.  - I will arrange for echo to follow aortic valve.  5. HTN: BP generally controlled off meds now.   6. Hyperlipidemia: Continue Crestor, acceptable lipids in 6/23.  7. Bipolar disorder: Under better control. 8. Dementia: On Namenda.  9. Pre-operative evaluation: As long as echo is stable, I suspect that patient is of reasonable risk to undergo left THR.  She is able to get up a flight of stairs with not cardiac symptoms. I am concerned that there is likely some risk of worsening memory after general anesthesia (has mild-moderate dementia).  I asked her to make sure that she feels like the surgery is absolutely necessary for her quality of life before going forward with it.   Followup in 6 months.   Loralie Champagne 03/31/2022

## 2022-03-31 NOTE — Patient Instructions (Signed)
Your physician has requested that you have an echocardiogram. Echocardiography is a painless test that uses sound waves to create images of your heart. It provides your doctor with information about the size and shape of your heart and how well your heart's chambers and valves are working. This procedure takes approximately one hour. There are no restrictions for this procedure.  Your physician has requested that you have a carotid duplex. This test is an ultrasound of the carotid arteries in your neck. It looks at blood flow through these arteries that supply the brain with blood. Allow one hour for this exam. There are no restrictions or special instructions.  Your physician recommends that you schedule a follow-up appointment in: 6 months, **PLEASE CALL OUR OFFICE IN SEPTEMBER TO SCHEDULE THIS APPOINTMENT  If you have any questions or concerns before your next appointment please send [name] a message through [place] or call our office at [phone].    TO LEAVE A MESSAGE FOR THE NURSE SELECT OPTION 2, PLEASE LEAVE A MESSAGE INCLUDING: YOUR NAME DATE OF BIRTH CALL BACK NUMBER REASON FOR CALL**this is important as we prioritize the call backs  YOU WILL RECEIVE A CALL BACK THE SAME DAY AS LONG AS YOU CALL BEFORE 4:00 PM

## 2022-04-05 NOTE — Progress Notes (Signed)
[facility] DEVICE PROGRAMMING  Patient Information: Name:  Meghan Oliver  DOB:  03-08-1941  MRN:  [id number]    R. Total Hip Arthoplasty Dr. Paralee Cancel  Device Information:  Clinic EP Physician:  Cristopher Peru, MD   Device Type:  Pacemaker Manufacturer and Phone #:  St. Jude/[employer]ott: [phone] Pacemaker Dependent?:  Yes.   Date of Last Device Check:  02/26/2022 Normal Device Function?:  Yes.    Electrophysiologist's Recommendations:  Have magnet available. Provide continuous ECG monitoring when magnet is used or reprogramming is to be performed.  Procedure may interfere with device function.  Magnet should be placed over device during procedure.  Per Device Clinic Standing Orders, Wanda Plump, RN  4:50 PM 04/05/2022

## 2022-04-12 LAB — POCT URINALYSIS DIPSTICK
Bilirubin, UA: NEGATIVE
Blood, UA: NEGATIVE
Glucose, UA: NEGATIVE
Ketones, UA: NEGATIVE
Leukocytes, UA: NEGATIVE
Nitrite, UA: NEGATIVE
Protein, UA: NEGATIVE
Spec Grav, UA: <=1.005 — AB (ref 1.010–1.025)
Urobilinogen, UA: 0.2 E.U./dL
pH, UA: 5 (ref 5.0–8.0)

## 2022-04-13 ENCOUNTER — Other Ambulatory Visit (INDEPENDENT_AMBULATORY_CARE_PROVIDER_SITE_OTHER): Payer: Medicare Other

## 2022-04-13 DIAGNOSIS — Z0000 Encounter for general adult medical examination without abnormal findings: Secondary | ICD-10-CM

## 2022-04-15 ENCOUNTER — Other Ambulatory Visit (HOSPITAL_BASED_OUTPATIENT_CLINIC_OR_DEPARTMENT_OTHER): Payer: Self-pay

## 2022-04-16 ENCOUNTER — Other Ambulatory Visit (HOSPITAL_BASED_OUTPATIENT_CLINIC_OR_DEPARTMENT_OTHER): Payer: Self-pay

## 2022-04-16 MED FILL — Rosuvastatin Calcium Tab 40 MG: ORAL | 90 days supply | Qty: 90 | Fill #0 | Status: AC

## 2022-04-18 ENCOUNTER — Encounter: Payer: Self-pay | Admitting: Internal Medicine

## 2022-04-18 NOTE — Patient Instructions (Signed)
It was a pleasure to see you today.  Medical issues are stable and you are cleared for surgery from a medical standpoint.  Return in 1 year or as needed and continue current medications.

## 2022-04-20 ENCOUNTER — Ambulatory Visit (HOSPITAL_COMMUNITY)
Admission: RE | Admit: 2022-04-20 | Discharge: 2022-04-20 | Disposition: A | Discharge status: Home / Self Care | Payer: Medicare Other | Source: Ambulatory Visit | Attending: Cardiology | Admitting: Cardiology

## 2022-04-20 ENCOUNTER — Ambulatory Visit (HOSPITAL_BASED_OUTPATIENT_CLINIC_OR_DEPARTMENT_OTHER)
Admission: RE | Admit: 2022-04-20 | Discharge: 2022-04-20 | Disposition: A | Discharge status: Home / Self Care | Payer: Medicare Other | Source: Ambulatory Visit

## 2022-04-20 DIAGNOSIS — I2510 Atherosclerotic heart disease of native coronary artery without angina pectoris: Secondary | ICD-10-CM | POA: Insufficient documentation

## 2022-04-20 DIAGNOSIS — I5022 Chronic systolic (congestive) heart failure: Secondary | ICD-10-CM | POA: Diagnosis not present

## 2022-04-20 DIAGNOSIS — Z950 Presence of cardiac pacemaker: Secondary | ICD-10-CM | POA: Diagnosis not present

## 2022-04-20 DIAGNOSIS — I080 Rheumatic disorders of both mitral and aortic valves: Secondary | ICD-10-CM | POA: Insufficient documentation

## 2022-04-20 DIAGNOSIS — R609 Edema, unspecified: Secondary | ICD-10-CM | POA: Diagnosis not present

## 2022-04-20 DIAGNOSIS — Z01818 Encounter for other preprocedural examination: Secondary | ICD-10-CM | POA: Diagnosis not present

## 2022-04-20 DIAGNOSIS — I959 Hypotension, unspecified: Secondary | ICD-10-CM | POA: Diagnosis not present

## 2022-04-20 DIAGNOSIS — I110 Hypertensive heart disease with heart failure: Secondary | ICD-10-CM | POA: Insufficient documentation

## 2022-04-20 DIAGNOSIS — R079 Chest pain, unspecified: Secondary | ICD-10-CM | POA: Diagnosis not present

## 2022-04-20 DIAGNOSIS — I5040 Unspecified combined systolic (congestive) and diastolic (congestive) heart failure: Secondary | ICD-10-CM | POA: Diagnosis not present

## 2022-04-20 DIAGNOSIS — E785 Hyperlipidemia, unspecified: Secondary | ICD-10-CM | POA: Diagnosis not present

## 2022-04-20 DIAGNOSIS — G4730 Sleep apnea, unspecified: Secondary | ICD-10-CM | POA: Insufficient documentation

## 2022-04-20 DIAGNOSIS — R9431 Abnormal electrocardiogram [ECG] [EKG]: Secondary | ICD-10-CM | POA: Diagnosis not present

## 2022-04-20 DIAGNOSIS — R0989 Other specified symptoms and signs involving the circulatory and respiratory systems: Secondary | ICD-10-CM | POA: Diagnosis not present

## 2022-04-20 NOTE — Progress Notes (Signed)
Carotid artery duplex completed. Refer to "CV Proc" under chart review to view preliminary results.  04/20/2022 3:26 PM Kelby Aline., MHA, RVT, RDCS, RDMS

## 2022-04-20 NOTE — Progress Notes (Signed)
  Echocardiogram 2D Echocardiogram has been performed.  Meghan Oliver 04/20/2022, 3:16 PM

## 2022-04-21 LAB — ECHOCARDIOGRAM COMPLETE
AR max vel: 2.12 cm2
AV Area VTI: 2.05 cm2
AV Area mean vel: 2.03 cm2
AV Mean grad: 8 mmHg
AV Peak grad: 13.2 mmHg
Ao pk vel: 1.82 m/s
Area-P 1/2: 3.5 cm2
Calc EF: 71.8 %
MV M vel: 6.02 m/s
MV Peak grad: 145 mmHg
P 1/2 time: 450 msec
Radius: 0.6 cm
Single Plane A2C EF: 79.9 %
Single Plane A4C EF: 60.5 %

## 2022-04-22 ENCOUNTER — Other Ambulatory Visit (HOSPITAL_BASED_OUTPATIENT_CLINIC_OR_DEPARTMENT_OTHER): Payer: Self-pay

## 2022-04-22 ENCOUNTER — Telehealth (HOSPITAL_COMMUNITY): Payer: Self-pay

## 2022-04-22 MED FILL — Levothyroxine Sodium Tab 75 MCG: ORAL | 90 days supply | Qty: 90 | Fill #0 | Status: AC

## 2022-04-22 NOTE — Telephone Encounter (Addendum)
  Pt aware, agreeable, and verbalized understanding   ----- Message from Larey Dresser, MD sent at 04/21/2022 10:02 PM EDT ----- EF normal with mild-moderate MR and mild-moderate AI, similar to prior study.

## 2022-05-03 ENCOUNTER — Other Ambulatory Visit (HOSPITAL_BASED_OUTPATIENT_CLINIC_OR_DEPARTMENT_OTHER): Payer: Self-pay

## 2022-05-05 NOTE — Patient Instructions (Addendum)
SURGICAL WAITING ROOM VISITATION Patients having surgery or a procedure may have no more than 2 support people in the waiting area - these visitors may rotate.   Children under the age of 36 must have an adult with them who is not the patient. If the patient needs to stay at the hospital during part of their recovery, the visitor guidelines for inpatient rooms apply. Pre-op nurse will coordinate an appropriate time for 1 support person to accompany patient in pre-op.  This support person may not rotate.    Please refer to the [facility] website for the visitor guidelines for Inpatients (after your surgery is over and you are in a regular room).       Your procedure is scheduled on:  05/18/2022    Report to [facility] Main Entrance    Report to admitting at  [place] AM   Call this number if you have problems the morning of surgery [phone]   Do not eat food :After Midnight.   After Midnight you may have the following liquids until ___ 1115___ AM  DAY OF SURGERY  Water Non-Citrus Juices (without pulp, NO RED) Carbonated Beverages Black Coffee (NO MILK/CREAM OR CREAMERS, sugar ok)  Clear Tea (NO MILK/CREAM OR CREAMERS, sugar ok) regular and decaf                             Plain Jell-O (NO RED)                                           Fruit ices (not with fruit pulp, NO RED)                                     Popsicles (NO RED)                                                               Sports drinks like Gatorade (NO RED)                    The day of surgery:  Drink ONE (1) Pre-Surgery Clear Ensure or G2 at 1115 AM  ( have completed by ) the morning of surgery. Drink in one sitting. Do not sip.  This drink was given to you during your hospital  pre-op appointment visit. Nothing else to drink after completing the  Pre-Surgery Clear Ensure or G2.      .        Oral Hygiene is also important to reduce your risk of infection.                                     Remember - BRUSH YOUR TEETH THE MORNING OF SURGERY WITH YOUR REGULAR TOOTHPASTE   Do NOT smoke after Midnight   Take these medicines the morning of surgery with A SIP OF WATER:  dexilant, toviaz, synthroid, namenda, paxil, primidone   DO NOT TAKE ANY ORAL DIABETIC MEDICATIONS DAY OF  YOUR SURGERY  Bring CPAP mask and tubing day of surgery.                              You may not have any metal on your body including hair pins, jewelry, and body piercing             Do not wear make-up, lotions, powders, perfumes/cologne, or deodorant  Do not wear nail polish including gel and S&S, artificial/acrylic nails, or any other type of covering on natural nails including finger and toenails. If you have artificial nails, gel coating, etc. that needs to be removed by a nail salon please have this removed prior to surgery or surgery may need to be canceled/ delayed if the surgeon/ anesthesia feels like they are unable to be safely monitored.   Do not shave  48 hours prior to surgery.               Men may shave face and neck.   Do not bring valuables to the hospital. [place].   Contacts, dentures or bridgework may not be worn into surgery.   Bring small overnight bag day of surgery.   DO NOT [place]. PHARMACY WILL DISPENSE MEDICATIONS LISTED ON YOUR MEDICATION LIST TO YOU DURING YOUR ADMISSION [place]!    Patients discharged on the day of surgery will not be allowed to drive home.  Someone NEEDS to stay with you for the first 24 hours after anesthesia.   Special Instructions: Bring a copy of your healthcare power of attorney and living will documents         the day of surgery if you haven't scanned them before.              Please read over the following fact sheets you were given: IF YOU HAVE QUESTIONS ABOUT YOUR PRE-OP INSTRUCTIONS PLEASE CALL [phone]     [facility] - Preparing for  Surgery Before surgery, you can play an important role.  Because skin is not sterile, your skin needs to be as free of germs as possible.  You can reduce the number of germs on your skin by washing with CHG (chlorahexidine gluconate) soap before surgery.  CHG is an antiseptic cleaner which kills germs and bonds with the skin to continue killing germs even after washing. Please DO NOT use if you have an allergy to CHG or antibacterial soaps.  If your skin becomes reddened/irritated stop using the CHG and inform your nurse when you arrive at Short Stay. Do not shave (including legs and underarms) for at least 48 hours prior to the first CHG shower.  You may shave your face/neck. Please follow these instructions carefully:  1.  Shower with CHG Soap the night before surgery and the  morning of Surgery.  2.  If you choose to wash your hair, wash your hair first as usual with your  normal  shampoo.  3.  After you shampoo, rinse your hair and body thoroughly to remove the  shampoo.                           4.  Use CHG as you would any other liquid soap.  You can apply chg directly  to the skin  and wash                       Gently with a scrungie or clean washcloth.  5.  Apply the CHG Soap to your body ONLY FROM THE NECK DOWN.   Do not use on face/ open                           Wound or open sores. Avoid contact with eyes, ears mouth and genitals (private parts).                       Wash face,  Genitals (private parts) with your normal soap.             6.  Wash thoroughly, paying special attention to the area where your surgery  will be performed.  7.  Thoroughly rinse your body with warm water from the neck down.  8.  DO NOT shower/wash with your normal soap after using and rinsing off  the CHG Soap.                9.  Pat yourself dry with a clean towel.            10.  Wear clean pajamas.            11.  Place clean sheets on your bed the night of your first shower and do not  sleep with pets. Day  of Surgery : Do not apply any lotions/deodorants the morning of surgery.  Please wear clean clothes to the hospital/surgery center.  FAILURE TO FOLLOW THESE INSTRUCTIONS MAY RESULT IN THE CANCELLATION OF YOUR SURGERY PATIENT SIGNATURE_________________________________  NURSE SIGNATURE__________________________________  ________________________________________________________________________ __________________________________________________________________

## 2022-05-05 NOTE — Progress Notes (Signed)
Anesthesia Review:  PCP: DR Emeline General  Cardiologist : Loralie Champagne- LOV 03/31/22  Chest x-ray : EKG : 03/31/22  Echo : 04/21/2022  Device check  Stress test: 2019  Carotids- 04/30/22  Cardiac Cath :  Activity level:  Sleep Study/ CPAP : Fasting Blood Sugar :      / Checks Blood Sugar -- times a day:   Blood Thinner/ Instructions /Last Dose: ASA / Instructions/ Last Dose :  81 mg Aspirin

## 2022-05-09 ENCOUNTER — Other Ambulatory Visit: Payer: Self-pay | Admitting: Psychiatry

## 2022-05-09 DIAGNOSIS — F3181 Bipolar II disorder: Secondary | ICD-10-CM

## 2022-05-10 ENCOUNTER — Other Ambulatory Visit: Payer: Self-pay

## 2022-05-10 ENCOUNTER — Encounter (HOSPITAL_COMMUNITY): Payer: Self-pay

## 2022-05-10 ENCOUNTER — Encounter (HOSPITAL_COMMUNITY)
Admission: RE | Admit: 2022-05-10 | Discharge: 2022-05-10 | Disposition: A | Discharge status: Home / Self Care | Payer: Medicare Other | Source: Ambulatory Visit | Attending: Orthopedic Surgery | Admitting: Orthopedic Surgery

## 2022-05-10 VITALS — BP 156/66 | HR 61 | Temp 98.3°F | Resp 16

## 2022-05-10 DIAGNOSIS — I130 Hypertensive heart and chronic kidney disease with heart failure and stage 1 through stage 4 chronic kidney disease, or unspecified chronic kidney disease: Secondary | ICD-10-CM | POA: Diagnosis not present

## 2022-05-10 DIAGNOSIS — N182 Chronic kidney disease, stage 2 (mild): Secondary | ICD-10-CM | POA: Insufficient documentation

## 2022-05-10 DIAGNOSIS — G4733 Obstructive sleep apnea (adult) (pediatric): Secondary | ICD-10-CM | POA: Diagnosis not present

## 2022-05-10 DIAGNOSIS — Z950 Presence of cardiac pacemaker: Secondary | ICD-10-CM | POA: Insufficient documentation

## 2022-05-10 DIAGNOSIS — I2510 Atherosclerotic heart disease of native coronary artery without angina pectoris: Secondary | ICD-10-CM | POA: Diagnosis not present

## 2022-05-10 DIAGNOSIS — I495 Sick sinus syndrome: Secondary | ICD-10-CM | POA: Insufficient documentation

## 2022-05-10 DIAGNOSIS — M1611 Unilateral primary osteoarthritis, right hip: Secondary | ICD-10-CM

## 2022-05-10 DIAGNOSIS — Z01812 Encounter for preprocedural laboratory examination: Secondary | ICD-10-CM | POA: Insufficient documentation

## 2022-05-10 DIAGNOSIS — Z01818 Encounter for other preprocedural examination: Secondary | ICD-10-CM

## 2022-05-10 DIAGNOSIS — I509 Heart failure, unspecified: Secondary | ICD-10-CM | POA: Diagnosis not present

## 2022-05-10 HISTORY — DX: Atherosclerotic heart disease of native coronary artery without angina pectoris: I25.10

## 2022-05-10 HISTORY — DX: Other amnesia: R41.3

## 2022-05-10 HISTORY — DX: Heart failure, unspecified: I50.9

## 2022-05-10 HISTORY — DX: Sick sinus syndrome: I49.5

## 2022-05-10 HISTORY — DX: Other specified health status: Z78.9

## 2022-05-10 LAB — SURGICAL PCR SCREEN
MRSA, PCR: NEGATIVE
Staphylococcus aureus: NEGATIVE

## 2022-05-10 LAB — BASIC METABOLIC PANEL
Anion gap: 5 (ref 5–15)
BUN: 29 mg/dL — ABNORMAL HIGH (ref 8–23)
CO2: 24 mmol/L (ref 22–32)
Calcium: 8.8 mg/dL — ABNORMAL LOW (ref 8.9–10.3)
Chloride: 107 mmol/L (ref 98–111)
Creatinine, Ser: 0.7 mg/dL (ref 0.44–1.00)
GFR, Estimated: >60 mL/min (ref >60)
Glucose, Bld: 87 mg/dL (ref 70–99)
Potassium: 4.2 mmol/L (ref 3.5–5.1)
Sodium: 136 mmol/L (ref 135–145)

## 2022-05-10 LAB — CBC
HCT: 38.4 % (ref 36.0–46.0)
Hemoglobin: 12 g/dL (ref 12.0–15.0)
MCH: 33 pg (ref 26.0–34.0)
MCHC: 31.3 g/dL (ref 30.0–36.0)
MCV: 105.5 fL — ABNORMAL HIGH (ref 80.0–100.0)
Platelets: 199 10*3/uL (ref 150–400)
RBC: 3.64 MIL/uL — ABNORMAL LOW (ref 3.87–5.11)
RDW: 13.2 % (ref 11.5–15.5)
WBC: 6.9 10*3/uL (ref 4.0–10.5)
nRBC: 0 % (ref 0.0–0.2)

## 2022-05-10 NOTE — Progress Notes (Addendum)
Anesthesia Review:pacemaker, CAD stent x1, OSA no cpap   PCP: DR Tommie Ard Baxley  Cardiologist : Loralie Champagne- LOV 03/31/22  Chest x-ray : EKG : 03/31/22  Echo : 04/21/2022  Device check  Stress test: 2019  Carotids- 04/30/22  Cardiac Cath :  Activity level: Able to complete ADL's without SOB  Sleep Study/ CPAP : Fasting Blood Sugar :      / Checks Blood Sugar -- times a day:   Blood Thinner/ Instructions /Last Dose: ASA / Instructions/ Last Dose :  81 mg Aspirin  stop 1 week prior

## 2022-05-11 NOTE — Anesthesia Preprocedure Evaluation (Addendum)
Anesthesia Evaluation  Patient identified by MRN, date of birth, ID band Patient awake    Reviewed: Allergy & Precautions, NPO status , Patient's Chart, lab work & pertinent test results  Airway Mallampati: II  TM Distance: >3 FB Neck ROM: Full    Dental  (+) Dental Advisory Given, Teeth Intact, Caps   Pulmonary sleep apnea , former smoker,    Pulmonary exam normal breath sounds clear to auscultation       Cardiovascular hypertension, pulmonary hypertension+ angina + CAD and +CHF  + pacemaker + Valvular Problems/Murmurs AI and MR  Rhythm:Regular Rate:Normal + Systolic murmurs Echo 01/5996 1. Left ventricular ejection fraction, by estimation, is 65 to 70%. The left ventricle has normal function. The left ventricle has no regional wall motion abnormalities. Left ventricular diastolic parameters are indeterminate. The average left ventricular global longitudinal strain is -21.6%. The global longitudinal strain is normal.  2. Right ventricular systolic function is normal. The right ventricular size is normal. There is mildly elevated pulmonary artery systolic pressure. The estimated right ventricular systolic pressure is 74.1 mmHg.  3. Left atrial size was moderately dilated.  4. The mitral valve is normal in structure. Mild to moderate mitral valve regurgitation. No evidence of mitral stenosis.  5. The aortic valve is tricuspid. There is moderate calcification of the aortic valve. There is moderate thickening of the aortic valve. Aortic valve regurgitation is mild to moderate. Aortic valve sclerosis/calcification is present, without any evidence of aortic stenosis.  6. The inferior vena cava is normal in size with greater than 50% respiratory variability, suggesting right atrial pressure of 3 mmHg.    Pacer interrogation 02/2022 97% AP-VS   Neuro/Psych  Headaches, PSYCHIATRIC DISORDERS Anxiety Depression Bipolar Disorder     GI/Hepatic Neg liver ROS, hiatal hernia, PUD, GERD  Medicated,  Endo/Other  Hypothyroidism   Renal/GU Renal disease     Musculoskeletal  (+) Arthritis ,   Abdominal   Peds  Hematology negative hematology ROS (+)   Anesthesia Other Findings   Reproductive/Obstetrics                                                           Anesthesia Evaluation  Patient identified by MRN, date of birth, ID band Patient awake    Reviewed: Allergy & Precautions, NPO status , Patient's Chart, lab work & pertinent test results  Airway Mallampati: II  TM Distance: >3 FB Neck ROM: Full    Dental  (+) Teeth Intact, Dental Advisory Given,    Pulmonary sleep apnea , former smoker,    breath sounds clear to auscultation       Cardiovascular hypertension, + CAD and + Peripheral Vascular Disease  + Valvular Problems/Murmurs  Rhythm:Regular Rate:Normal     Neuro/Psych  Headaches, Depression Bipolar Disorder    GI/Hepatic PUD, GERD  Medicated,  Endo/Other  Hypothyroidism   Renal/GU      Musculoskeletal   Abdominal   Peds  Hematology   Anesthesia Other Findings   Reproductive/Obstetrics                           Anesthesia Physical Anesthesia Plan  ASA: III  Anesthesia Plan: MAC   Post-op Pain Management:    Induction: Intravenous  PONV Risk Score and Plan:  Ondansetron  Airway Management Planned: Natural Airway and Simple Face Mask  Additional Equipment:   Intra-op Plan:   Post-operative Plan:   Informed Consent: I have reviewed the patients History and Physical, chart, labs and discussed the procedure including the risks, benefits and alternatives for the proposed anesthesia with the patient or authorized representative who has indicated his/her understanding and acceptance.     Plan Discussed with: CRNA and Anesthesiologist  Anesthesia Plan Comments:         Anesthesia Quick  Evaluation  Anesthesia Physical Anesthesia Plan  ASA: 3  Anesthesia Plan: Spinal   Post-op Pain Management: Ofirmev IV (intra-op)*   Induction: Intravenous  PONV Risk Score and Plan: 3 and Ondansetron, Treatment may vary due to age or medical condition, Propofol infusion and TIVA  Airway Management Planned: Natural Airway  Additional Equipment:   Intra-op Plan:   Post-operative Plan:   Informed Consent: I have reviewed the patients History and Physical, chart, labs and discussed the procedure including the risks, benefits and alternatives for the proposed anesthesia with the patient or authorized representative who has indicated his/her understanding and acceptance.     Dental advisory given  Plan Discussed with: CRNA  Anesthesia Plan Comments: (See APP note by Durel Salts, FNP )    Anesthesia Quick Evaluation

## 2022-05-11 NOTE — Progress Notes (Signed)
Anesthesia Chart Review:   Case: [id number] Date/Time: 05/18/22 1405   Procedure: TOTAL HIP ARTHROPLASTY ANTERIOR APPROACH (Right: Hip)   Anesthesia type: Spinal   Pre-op diagnosis: Right hip osteoarthritis   Location: [facility] ROOM 10 / [facility]   Surgeons: Paralee Cancel, MD       DISCUSSION: Pt is 81 years old with hx CAD (DES to LAD 2018), sick sinus syndrome, pacemaker (St. Jude implanted 03/17/18), carotid stenosis, HTN, OSA, dementia  Perioperative prescription for pacemaker 03/30/22:  Have magnet available. Provide continuous ECG monitoring when magnet is used or reprogramming is to be performed.  Procedure should not interfere with device function.  No device programming or magnet placement needed.   VS: BP (!) 156/66   Pulse 61   Temp 36.8 C (Oral)   Resp 16   LMP  (LMP Unknown)   SpO2 100%   PROVIDERS: - PCP is Baxley, Cresenciano Lick, MD - Cardiologist is Loralie Champagne, MD who cleared pt for surgery at last office visit 03/31/22 pending echo results. Echo stable, see results below.  - EP cardiologist is Cristopher Peru, MD. Last office visit 02/26/22 with Noel Gerold, PA   LABS: Labs reviewed: Acceptable for surgery. (all labs ordered are listed, but only abnormal results are displayed)  Labs Reviewed  BASIC METABOLIC PANEL - Abnormal; Notable for the following components:      Result Value   BUN 29 (*)    Calcium 8.8 (*)    All other components within normal limits  CBC - Abnormal; Notable for the following components:   RBC 3.64 (*)    MCV 105.5 (*)    All other components within normal limits  SURGICAL PCR SCREEN  TYPE AND SCREEN    EKG 03/31/22: a-paced, poor RWP   CV: Echo 04/20/22:  1. Left ventricular ejection fraction, by estimation, is 65 to 70%. The left ventricle has normal function. The left ventricle has no regional wall motion abnormalities. Left ventricular diastolic parameters are indeterminate. The average left ventricular global longitudinal strain  is -21.6 %. The global longitudinal strain is normal.  2. Right ventricular systolic function is normal. The right ventricular size is normal. There is mildly elevated pulmonary artery systolic pressure. The estimated right ventricular systolic pressure is 69.6 mmHg. 3. Left atrial size was moderately dilated.  4. The mitral valve is normal in structure. Mild to moderate mitral valve regurgitation. No evidence of mitral stenosis.  5. The aortic valve is tricuspid. There is moderate calcification of the aortic valve. There is moderate thickening of the aortic valve. Aortic valve regurgitation is mild to moderate. Aortic valve sclerosis/calcification is present, without any evidence of aortic stenosis.  6. The inferior vena cava is normal in size with greater than 50% respiratory variability, suggesting right atrial pressure of 3 mmHg.  - Comparison(s): No significant change from prior study.   Carotid US 04/20/22:  - Right Carotid: Velocities in the right ICA are consistent with a 1-39% stenosis.  - Left Carotid: Velocities in the left ICA are consistent with a 1-39% stenosis.  - Vertebrals:  Bilateral vertebral arteries demonstrate antegrade flow.  - Subclavians: Normal flow hemodynamics were seen in bilateral subclavian arteries.  Nuclear stress test 06/20/18:  The left ventricular ejection fraction is hyperdynamic (>65%). Nuclear stress EF: 70%. There was no ST segment deviation noted during stress. Defect 1: There is a small defect of moderate severity present in the apex location. This is a low risk study.  Cardiac cath 05/16/17:  1. Focal severe proximal 80-90% LAD stenosis near origin of D1.  (D1 ostial 30%) 2. Luminal irregularities in LCx and RCA systems.   3. Ramus 25%   Past Medical History:  Diagnosis Date   Anxiety    Arthritis    "hips, spine" (03/17/2018)   Bipolar II disorder (HCC)    CHF (congestive heart failure) (HCC)    Chronic bronchitis (HCC)    Chronic lower back  pain    Chronic right hip pain    CKD (chronic kidney disease), stage II    Coronary artery disease    stent x1   Esophagitis, erosive    GAD (generalized anxiety disorder)    GERD (gastroesophageal reflux disease)    Headache    "maybe monthly" (03/17/2018))   Heart murmur, systolic    History of adenomatous polyp of colon    08-04-2016  tubular adenoma   History of blood transfusion 12/2017   "related to vascular hematoma"   History of electroconvulsive therapy    at [place]--  started 04-15-2015 to 11-17-2016  total greater than 40 times   History of hiatal hernia    Hyperlipidemia    Hypertension    Hypothyroidism    Internal carotid artery stenosis, bilateral    per last duplex 05-01-2014  bilateral ICA 40-59%   Major depression, chronic    ECT treatments extensive and multiple started 07/ 2016   Memory loss    "both short and long-term; needs frequent reminders to follow instrucitons" (05/16/2017)   Migraines    "none in years" (03/17/2018)   OSA (obstructive sleep apnea)    per study 06/ 2012 moderate OSA  ; "refuses to wear masks" (03/17/2018)   Osteoporosis    Poor historian    due to short term memory loss   Presence of permanent cardiac pacemaker 03/17/2018   Pulmonary nodule    monitored by pcp   S/P placement of cardiac pacemaker 03/17/18 ST Jude  03/18/2018   Short-term memory loss    Sick sinus syndrome ([place])     Past Surgical History:  Procedure Laterality Date   APPENDECTOMY  1971   AUGMENTATION MAMMAPLASTY Bilateral    CARDIOVASCULAR STRESS TEST  01-30-2015  dr Aundra Dubin   Low risk nuclear study w/ no evidence ischemia or infarction/  normal LV funciton and wall motion , 76%   COLONOSCOPY W/ BIOPSIES AND POLYPECTOMY  "multiple"   COLONOSCOPY WITH ESOPHAGOGASTRODUODENOSCOPY (EGD)  last one 08-04-2016   CORONARY ANGIOPLASTY WITH STENT PLACEMENT  05/16/2017   "LAD"   CORONARY STENT INTERVENTION N/A 05/16/2017   Procedure: CORONARY STENT INTERVENTION;  Surgeon:  Burnell Blanks, MD;  Location: [place] [facility];  Service: Cardiovascular;  Laterality: N/A;   ESOPHAGOGASTRODUODENOSCOPY  02-26-04   ESOPHAGOGASTRODUODENOSCOPY  "multiple"   INSERT / REPLACE / REMOVE PACEMAKER  03/17/2018   LEFT HEART CATH AND CORONARY ANGIOGRAPHY N/A 05/16/2017   Procedure: LEFT HEART CATH AND CORONARY ANGIOGRAPHY;  Surgeon: Larey Dresser, MD;  Location: [place] [facility];  Service: Cardiovascular;  Laterality: N/A;   OVARIAN CYST SURGERY  1970s   Laparotomy    PACEMAKER IMPLANT N/A 03/17/2018   Procedure: PACEMAKER IMPLANT;  Surgeon: Evans Lance, MD;  Location: [place] [facility];  Service: Cardiovascular;  Laterality: N/A;   PORT-A-CATH PLACEMENT  05/31/2016; 2018   "@ [facility]; for ECT series"; "@ [facility] also"   PORT-A-CATH REMOVAL N/A 03/11/2017   Procedure: REMOVAL PORT-A-CATH;  Surgeon: Jackolyn Confer, MD;  Location: [place]  [facility];  Service: General;  Laterality: N/A;   PORTA CATH REMOVAL  03/17/2018   PORTA CATH REMOVAL  03/17/2018   Procedure: PORTA CATH REMOVAL;  Surgeon: Evans Lance, MD;  Location: [place] [facility];  Service: Cardiovascular;;   TRANSTHORACIC ECHOCARDIOGRAM  09/05/2013  dr Aundra Dubin   mild LVH, ef 18-56%, grade 1 diastolic dysfunction/  very mild AV stenosis with mild AR/  trivial MR and PT/ mild to moderate LAE/ mild TR/ mild pulmonary hypertension with PA peak pressure 73mHg   TUBAL LIGATION      MEDICATIONS:  ASPIRIN 81 PO   Calcium Carbonate-Vitamin D (CALCIUM 600/VITAMIN D PO)   cetirizine (ZYRTEC) 10 MG tablet   Cholecalciferol (VITAMIN D3) 50 MCG (2000 UT) TABS   dexlansoprazole (DEXILANT) 60 MG capsule   fesoterodine (TOVIAZ) 8 MG TB24 tablet   Krill Oil 500 MG CAPS   Lavender Oil 80 MG CAPS   levothyroxine (SYNTHROID) 75 MCG tablet   linaclotide (LINZESS) 72 MCG capsule   lithium carbonate 300 MG capsule   LORazepam (ATIVAN) 0.5 MG tablet   memantine (NAMENDA) 10 MG tablet   mirtazapine (REMERON) 7.5  MG tablet   Multiple Vitamins-Minerals (MULTIVITAMIN WITH MINERALS) tablet   Multiple Vitamins-Minerals (PRESERVISION AREDS 2 PO)   nortriptyline (PAMELOR) 75 MG capsule   PARoxetine (PAXIL) 20 MG tablet   polyethylene glycol (MIRALAX / GLYCOLAX) 17 g packet   primidone (MYSOLINE) 50 MG tablet   Pyridoxine HCl (VITAMIN B-6) 500 MG tablet   rosuvastatin (CRESTOR) 40 MG tablet   sennosides-docusate sodium (SENOKOT-S) 8.6-50 MG tablet   traMADol (ULTRAM) 50 MG tablet   valACYclovir (VALTREX) 1000 MG tablet   vitamin B-12 (CYANOCOBALAMIN) 1000 MCG tablet   No current facility-administered medications for this encounter.    If no changes, I anticipate pt can proceed with surgery as scheduled.   AWilleen Cass PhD, FNP-BC [facility]Anesthesiology Phone: ([phone]/05/2022 1:02 PM

## 2022-05-12 ENCOUNTER — Other Ambulatory Visit: Payer: Self-pay | Admitting: Internal Medicine

## 2022-05-12 ENCOUNTER — Other Ambulatory Visit: Payer: Self-pay | Admitting: Psychiatry

## 2022-05-12 DIAGNOSIS — F3181 Bipolar II disorder: Secondary | ICD-10-CM

## 2022-05-12 DIAGNOSIS — G250 Essential tremor: Secondary | ICD-10-CM

## 2022-05-13 ENCOUNTER — Other Ambulatory Visit (HOSPITAL_BASED_OUTPATIENT_CLINIC_OR_DEPARTMENT_OTHER): Payer: Self-pay

## 2022-05-14 ENCOUNTER — Other Ambulatory Visit (HOSPITAL_BASED_OUTPATIENT_CLINIC_OR_DEPARTMENT_OTHER): Payer: Self-pay

## 2022-05-17 NOTE — H&P (Signed)
TOTAL HIP ADMISSION H&P  Patient is admitted for right total hip arthroplasty.  Subjective:  Chief Complaint: right hip pain  HPI: Meghan Oliver, 81 y.o. female, has a history of pain and functional disability in the right hip(s) due to arthritis and patient has failed non-surgical conservative treatments for greater than 12 weeks to include NSAID's and/or analgesics and activity modification.  Onset of symptoms was gradual starting 2 years ago with gradually worsening course since that time.The patient noted no past surgery on the right hip(s).  Patient currently rates pain in the right hip at 7 out of 10 with activity. Patient has worsening of pain with activity and weight bearing, pain that interfers with activities of daily living, and pain with passive range of motion. Patient has evidence of joint space narrowing by imaging studies. This condition presents safety issues increasing the risk of falls. There is no current active infection.  Patient Active Problem List   Diagnosis Date Noted   Overactive bladder 02/19/2022   S/P placement of cardiac pacemaker 03/17/18 [facility]  03/18/2018   Sinus node dysfunction (HCC) 03/17/2018   Bradycardia 07/16/2017   Unstable angina (HCC)    Hypotension 04/20/2017   CAD (coronary artery disease) 01/05/2016   Coronary artery calcification seen on CAT scan 06/25/2012   Lung nodule 06/25/2012   Hypothyroidism 03/21/2012   Chronic cough 08/12/2011   OSA (obstructive sleep apnea) 04/05/2011   Essential hypertension 11/06/2010   EDEMA 11/06/2010   Chest pain 11/06/2010   Hyperlipidemia 08/22/2007   Bipolar disorder ([place]) 08/22/2007   GERD 08/22/2007   LOW BACK PAIN 08/22/2007   OSTEOPOROSIS 08/22/2007   INSOMNIA 57/32/2025   SYSTOLIC MURMUR 42/70/6237   Past Medical History:  Diagnosis Date   Anxiety    Arthritis    "hips, spine" (03/17/2018)   Bipolar II disorder (HCC)    CHF (congestive heart failure) (HCC)    Chronic bronchitis (HCC)     Chronic lower back pain    Chronic right hip pain    CKD (chronic kidney disease), stage II    Coronary artery disease    stent x1   Esophagitis, erosive    GAD (generalized anxiety disorder)    GERD (gastroesophageal reflux disease)    Headache    "maybe monthly" (03/17/2018))   Heart murmur, systolic    History of adenomatous polyp of colon    08-04-2016  tubular adenoma   History of blood transfusion 12/2017   "related to vascular hematoma"   History of electroconvulsive therapy    at [place]--  started 04-15-2015 to 11-17-2016  total greater than 40 times   History of hiatal hernia    Hyperlipidemia    Hypertension    Hypothyroidism    Internal carotid artery stenosis, bilateral    per last duplex 05-01-2014  bilateral ICA 40-59%   Major depression, chronic    ECT treatments extensive and multiple started 07/ 2016   Memory loss    "both short and long-term; needs frequent reminders to follow instrucitons" (05/16/2017)   Migraines    "none in years" (03/17/2018)   OSA (obstructive sleep apnea)    per study 06/ 2012 moderate OSA  ; "refuses to wear masks" (03/17/2018)   Osteoporosis    Poor historian    due to short term memory loss   Presence of permanent cardiac pacemaker 03/17/2018   Pulmonary nodule    monitored by pcp   S/P placement of cardiac pacemaker 03/17/18 [facility]  03/18/2018  Short-term memory loss    Sick sinus syndrome [facility])     Past Surgical History:  Procedure Laterality Date   APPENDECTOMY  1971   AUGMENTATION MAMMAPLASTY Bilateral    CARDIOVASCULAR STRESS TEST  01-30-2015  dr Aundra Dubin   Low risk nuclear study w/ no evidence ischemia or infarction/  normal LV funciton and wall motion , 76%   COLONOSCOPY W/ BIOPSIES AND POLYPECTOMY  "multiple"   COLONOSCOPY WITH ESOPHAGOGASTRODUODENOSCOPY (EGD)  last one 08-04-2016   CORONARY ANGIOPLASTY WITH STENT PLACEMENT  05/16/2017   "LAD"   CORONARY STENT INTERVENTION N/A 05/16/2017   Procedure: CORONARY STENT  INTERVENTION;  Surgeon: Burnell Blanks, MD;  Location: [place] [facility];  Service: Cardiovascular;  Laterality: N/A;   ESOPHAGOGASTRODUODENOSCOPY  02-26-04   ESOPHAGOGASTRODUODENOSCOPY  "multiple"   INSERT / REPLACE / REMOVE PACEMAKER  03/17/2018   LEFT HEART CATH AND CORONARY ANGIOGRAPHY N/A 05/16/2017   Procedure: LEFT HEART CATH AND CORONARY ANGIOGRAPHY;  Surgeon: Larey Dresser, MD;  Location: [place] [facility];  Service: Cardiovascular;  Laterality: N/A;   OVARIAN CYST SURGERY  1970s   Laparotomy    PACEMAKER IMPLANT N/A 03/17/2018   Procedure: PACEMAKER IMPLANT;  Surgeon: Evans Lance, MD;  Location: [place] [facility];  Service: Cardiovascular;  Laterality: N/A;   PORT-A-CATH PLACEMENT  05/31/2016; 2018   "@ [facility]; for ECT series"; "@ [facility] also"   PORT-A-CATH REMOVAL N/A 03/11/2017   Procedure: REMOVAL PORT-A-CATH;  Surgeon: Jackolyn Confer, MD;  Location: [facility];  Service: General;  Laterality: N/A;   PORTA CATH REMOVAL  03/17/2018   PORTA CATH REMOVAL  03/17/2018   Procedure: PORTA CATH REMOVAL;  Surgeon: Evans Lance, MD;  Location: [place] [facility];  Service: Cardiovascular;;   TRANSTHORACIC ECHOCARDIOGRAM  09/05/2013  dr Aundra Dubin   mild LVH, ef 45-80%, grade 1 diastolic dysfunction/  very mild AV stenosis with mild AR/  trivial MR and PT/ mild to moderate LAE/ mild TR/ mild pulmonary hypertension with PA peak pressure 18mHg   TUBAL LIGATION      No current facility-administered medications for this encounter.   Current Outpatient Medications  Medication Sig Dispense Refill Last Dose   ASPIRIN 81 PO Take 81 mg by mouth daily.      Calcium Carbonate-Vitamin D (CALCIUM 600/VITAMIN D PO) Take 1 tablet by mouth 2 (two) times daily.      cetirizine (ZYRTEC) 10 MG tablet Take 10 mg by mouth daily as needed for allergies.      Cholecalciferol (VITAMIN D3) 50 MCG (2000 UT) TABS Take 2,000 Units by mouth in the morning.      dexlansoprazole (DEXILANT)  60 MG capsule Take 1 capsule by mouth daily (Patient taking differently: Take 60 mg by mouth daily.) 90 capsule 3    fesoterodine (TOVIAZ) 8 MG TB24 tablet Take 1 tablet (8 mg total) by mouth daily. 90 tablet 3    Krill Oil 500 MG CAPS Take 500 mg by mouth daily.      Lavender Oil 80 MG CAPS Take 160 mg by mouth at bedtime.      linaclotide (LINZESS) 72 MCG capsule Take 1 capsule (72 mcg total) by mouth in the morning and at bedtime. 60 capsule 3    LORazepam (ATIVAN) 0.5 MG tablet TAKE 1/2 TO 1 TABLET BY MOUTH EVERY NIGHT AT BEDTIME AND 1 TABLET EVERY 8 HOURS AS NEEDED FOR ANXIETY (Patient taking differently: Take 0.25-0.5 mg by mouth See admin instructions. TAKE 1/2  TO 1 TABLET BY MOUTH EVERY NIGHT AT BEDTIME AND 1 TABLET EVERY 8 HOURS AS NEEDED FOR ANXIETY) 180 tablet 1    memantine (NAMENDA) 10 MG tablet Take 1 tablet (10 mg total) by mouth 2 (two) times daily. 180 tablet 1    mirtazapine (REMERON) 7.5 MG tablet Take 1 tablet (7.5 mg total) by mouth at bedtime. 90 tablet 3    Multiple Vitamins-Minerals (MULTIVITAMIN WITH MINERALS) tablet Take 1 tablet by mouth daily.      Multiple Vitamins-Minerals (PRESERVISION AREDS 2 PO) Take 1 capsule by mouth in the morning and at bedtime.      nortriptyline (PAMELOR) 75 MG capsule Take 1 capsule (75 mg total) by mouth at bedtime. 90 capsule 1    polyethylene glycol (MIRALAX / GLYCOLAX) 17 g packet Take 17 g by mouth daily as needed for mild constipation.      Pyridoxine HCl (VITAMIN B-6) 500 MG tablet Take 500 mg by mouth 2 (two) times daily.      rosuvastatin (CRESTOR) 40 MG tablet TAKE 1 TABLET (40 MG) BY MOUTH DAILY (Patient taking differently: Take 40 mg by mouth daily.) 90 tablet 3    sennosides-docusate sodium (SENOKOT-S) 8.6-50 MG tablet Take 2 tablets by mouth at bedtime.      traMADol (ULTRAM) 50 MG tablet TAKE 1 TABLET EVERY 4 TO 6 HOURS AS NEEDED FOR PAIN. (Patient taking differently: Take 50 mg by mouth every 6 (six) hours as needed for moderate  pain.) 15 tablet 0    valACYclovir (VALTREX) 1000 MG tablet Take 2 tablets by mouth at onset, then 2 tablets 12 hours later (Patient taking differently: Take 2,000 mg by mouth as directed.) 30 tablet 4    vitamin B-12 (CYANOCOBALAMIN) 1000 MCG tablet Take 1,000 mcg by mouth at bedtime.      levothyroxine (SYNTHROID) 75 MCG tablet TAKE 1 TABLET(75 MCG) BY MOUTH DAILY 90 tablet 2    lithium carbonate 300 MG capsule TAKE 1 CAPSULE(300 MG) BY MOUTH AT BEDTIME 90 capsule 0    PARoxetine (PAXIL) 20 MG tablet TAKE 1 TABLET(20 MG) BY MOUTH AT BEDTIME 90 tablet 0    primidone (MYSOLINE) 50 MG tablet TAKE 2 TABLETS BY MOUTH EVERY MORNING AND 1 TABLET EVERY EVENING 270 tablet 0    Allergies  Allergen Reactions   Propranolol Other (See Comments)    Low blood pressure   Brexpiprazole Other (See Comments)    Aphasia and catatonia    Social History   Tobacco Use   Smoking status: Former    Packs/day: 2.00    Years: 15.00    Total pack years: 30.00    Types: Cigarettes    Quit date: 01/13/1971    Years since quitting: 51.3   Smokeless tobacco: Never  Substance Use Topics   Alcohol use: Yes    Comment: occassional 1 x a week    Family History  Problem Relation Age of Onset   Heart attack Father 34       deceased   Hypertension Father    Heart disease Father    Breast cancer Paternal 38        Age unknown   Breast cancer Paternal 19        Age 85's   Colon cancer Neg Hx      Review of Systems  Constitutional:  Negative for chills and fever.  Respiratory:  Negative for cough and shortness of breath.   Cardiovascular:  Negative for chest pain.  Gastrointestinal:  Negative for nausea and vomiting.  Musculoskeletal:  Positive for arthralgias.     Objective:  Physical Exam Well nourished and well developed. General: Alert and oriented x3, cooperative and pleasant, no acute distress. Head: normocephalic, atraumatic, neck supple. Eyes: EOMI.  Musculoskeletal: Right hip  exam: Today based on her radiographs I did not stress assessing her range of motion but we did note with hip flexion internal rotation but there was pain over 5 degrees in neutral rotation of 20 degrees. Her left hip is otherwise normal No significant lower extremity edema, erythema or calf tenderness  Calves soft and nontender. Motor function intact in LE. Strength 5/5 LE bilaterally. Neuro: Distal pulses 2+. Sensation to light touch intact in LE.  Vital signs in last 24 hours:    Labs:   Estimated body mass index is 20.61 kg/m as calculated from the following:   Height as of 02/26/22: '4\' 10"'$  (1.473 m).   Weight as of 03/31/22: 44.7 kg.   Imaging Review Plain radiographs demonstrate severe degenerative joint disease of the right hip(s). The bone quality appears to be adequate for age and reported activity level.      Assessment/Plan:  End stage arthritis, right hip(s)  The patient history, physical examination, clinical judgement of the provider and imaging studies are consistent with end stage degenerative joint disease of the right hip(s) and total hip arthroplasty is deemed medically necessary. The treatment options including medical management, injection therapy, arthroscopy and arthroplasty were discussed at length. The risks and benefits of total hip arthroplasty were presented and reviewed. The risks due to aseptic loosening, infection, stiffness, dislocation/subluxation,  thromboembolic complications and other imponderables were discussed.  The patient acknowledged the explanation, agreed to proceed with the plan and consent was signed. Patient is being admitted for inpatient treatment for surgery, pain control, PT, OT, prophylactic antibiotics, VTE prophylaxis, progressive ambulation and ADL's and discharge planning.The patient is planning to be discharged  home.   Therapy Plans: HEP Disposition: Home with husband Planned DVT Prophylaxis: aspirin '81mg'$  BID DME needed:  youth walker PCP: Dr. Renold Genta, clearance received Cardiologist: Dr. Aundra Dubin - cleared pending echo results - had this done on 7/18 TXA: IV Allergies: rexulti - aphagia, propranolol - bradycardia Anesthesia Concerns: none BMI: 94.5 Last HgbA1c: Not diabetic   Other: - HEIGHT OKAY FOR HANA??  - Husband is a retired GI physician - hx of cardiac stent & pacemaker - [place], robaxin, tylenol   Costella Hatcher, PA-C Orthopedic Surgery [facility] [place] [phone]

## 2022-05-18 ENCOUNTER — Observation Stay (HOSPITAL_COMMUNITY): Payer: Medicare Other

## 2022-05-18 ENCOUNTER — Encounter (HOSPITAL_COMMUNITY): Payer: Self-pay | Admitting: Orthopedic Surgery

## 2022-05-18 ENCOUNTER — Ambulatory Visit (HOSPITAL_COMMUNITY): Payer: Medicare Other

## 2022-05-18 ENCOUNTER — Observation Stay (HOSPITAL_COMMUNITY)
Admission: RE | Admit: 2022-05-18 | Discharge: 2022-05-19 | Disposition: A | Discharge status: Home / Self Care | Payer: Medicare Other | Source: Ambulatory Visit | Attending: Orthopedic Surgery | Admitting: Orthopedic Surgery

## 2022-05-18 ENCOUNTER — Encounter (HOSPITAL_COMMUNITY)
Admission: RE | Disposition: A | Discharge status: Home / Self Care | Payer: Self-pay | Source: Ambulatory Visit | Attending: Orthopedic Surgery

## 2022-05-18 ENCOUNTER — Other Ambulatory Visit: Payer: Self-pay

## 2022-05-18 ENCOUNTER — Ambulatory Visit (HOSPITAL_BASED_OUTPATIENT_CLINIC_OR_DEPARTMENT_OTHER): Payer: Medicare Other | Admitting: Certified Registered Nurse Anesthetist

## 2022-05-18 ENCOUNTER — Ambulatory Visit (HOSPITAL_COMMUNITY): Payer: Medicare Other | Admitting: Emergency Medicine

## 2022-05-18 DIAGNOSIS — I110 Hypertensive heart disease with heart failure: Secondary | ICD-10-CM | POA: Diagnosis not present

## 2022-05-18 DIAGNOSIS — E039 Hypothyroidism, unspecified: Secondary | ICD-10-CM | POA: Insufficient documentation

## 2022-05-18 DIAGNOSIS — Z7982 Long term (current) use of aspirin: Secondary | ICD-10-CM | POA: Insufficient documentation

## 2022-05-18 DIAGNOSIS — I2510 Atherosclerotic heart disease of native coronary artery without angina pectoris: Secondary | ICD-10-CM | POA: Insufficient documentation

## 2022-05-18 DIAGNOSIS — I509 Heart failure, unspecified: Secondary | ICD-10-CM | POA: Diagnosis not present

## 2022-05-18 DIAGNOSIS — Z79899 Other long term (current) drug therapy: Secondary | ICD-10-CM | POA: Insufficient documentation

## 2022-05-18 DIAGNOSIS — N182 Chronic kidney disease, stage 2 (mild): Secondary | ICD-10-CM | POA: Insufficient documentation

## 2022-05-18 DIAGNOSIS — Z87891 Personal history of nicotine dependence: Secondary | ICD-10-CM | POA: Diagnosis not present

## 2022-05-18 DIAGNOSIS — Z955 Presence of coronary angioplasty implant and graft: Secondary | ICD-10-CM | POA: Diagnosis not present

## 2022-05-18 DIAGNOSIS — I5022 Chronic systolic (congestive) heart failure: Secondary | ICD-10-CM

## 2022-05-18 DIAGNOSIS — Z01818 Encounter for other preprocedural examination: Secondary | ICD-10-CM

## 2022-05-18 DIAGNOSIS — Z471 Aftercare following joint replacement surgery: Secondary | ICD-10-CM | POA: Diagnosis not present

## 2022-05-18 DIAGNOSIS — M1611 Unilateral primary osteoarthritis, right hip: Secondary | ICD-10-CM

## 2022-05-18 DIAGNOSIS — Z950 Presence of cardiac pacemaker: Secondary | ICD-10-CM | POA: Insufficient documentation

## 2022-05-18 DIAGNOSIS — I129 Hypertensive chronic kidney disease with stage 1 through stage 4 chronic kidney disease, or unspecified chronic kidney disease: Secondary | ICD-10-CM | POA: Diagnosis not present

## 2022-05-18 DIAGNOSIS — I25119 Atherosclerotic heart disease of native coronary artery with unspecified angina pectoris: Secondary | ICD-10-CM | POA: Diagnosis not present

## 2022-05-18 DIAGNOSIS — Z96641 Presence of right artificial hip joint: Secondary | ICD-10-CM

## 2022-05-18 HISTORY — PX: TOTAL HIP ARTHROPLASTY: SHX124

## 2022-05-18 LAB — TYPE AND SCREEN
ABO/RH(D): B POS
Antibody Screen: NEGATIVE

## 2022-05-18 LAB — ABO/RH: ABO/RH(D): B POS

## 2022-05-18 SURGERY — ARTHROPLASTY, HIP, TOTAL, ANTERIOR APPROACH
Anesthesia: Spinal | Site: Hip | Laterality: Right

## 2022-05-18 MED ORDER — ACETAMINOPHEN 500 MG PO TABS: 1000.0000 mg | ORAL_TABLET | Freq: Once | ORAL | Status: DC

## 2022-05-18 MED ORDER — METOCLOPRAMIDE HCL 5 MG/ML IJ SOLN: 5.0000 mg | Freq: Three times a day (TID) | INTRAMUSCULAR | Status: DC | PRN

## 2022-05-18 MED ORDER — PHENOL 1.4 % MT LIQD: 1.0000 | OROMUCOSAL | Status: DC | PRN

## 2022-05-18 MED ORDER — POLYETHYLENE GLYCOL 3350 17 G PO PACK
17.0000 g | PACK | Freq: Every day | ORAL | Status: DC | PRN
  Administered 2022-05-18: 17 g via ORAL
  Filled 2022-05-18: qty 1

## 2022-05-18 MED ORDER — POVIDONE-IODINE 10 % EX SWAB
2.0000 | Freq: Once | CUTANEOUS | Status: AC
Start: 2022-05-18 — End: 2022-05-18
  Administered 2022-05-18: 2 via TOPICAL

## 2022-05-18 MED ORDER — DEXAMETHASONE SODIUM PHOSPHATE 10 MG/ML IJ SOLN
8.0000 mg | Freq: Once | INTRAMUSCULAR | Status: AC
  Administered 2022-05-18: 8 mg via INTRAVENOUS

## 2022-05-18 MED ORDER — MEMANTINE HCL 10 MG PO TABS
10.0000 mg | ORAL_TABLET | Freq: Two times a day (BID) | ORAL | Status: DC
  Administered 2022-05-18 – 2022-05-19 (×2): 10 mg via ORAL
  Filled 2022-05-18 (×2): qty 1

## 2022-05-18 MED ORDER — NORTRIPTYLINE HCL 25 MG PO CAPS
75.0000 mg | ORAL_CAPSULE | Freq: Every day | ORAL | Status: DC
  Administered 2022-05-18: 75 mg via ORAL
  Filled 2022-05-18: qty 3

## 2022-05-18 MED ORDER — BUPIVACAINE IN DEXTROSE 0.75-8.25 % IT SOLN
INTRATHECAL | Status: DC | PRN
  Administered 2022-05-18: 1.4 mL via INTRATHECAL

## 2022-05-18 MED ORDER — DEXAMETHASONE SODIUM PHOSPHATE 10 MG/ML IJ SOLN
10.0000 mg | Freq: Once | INTRAMUSCULAR | Status: AC
  Administered 2022-05-19: 10 mg via INTRAVENOUS
  Filled 2022-05-18: qty 1

## 2022-05-18 MED ORDER — ASPIRIN 81 MG PO CHEW
81.0000 mg | CHEWABLE_TABLET | Freq: Two times a day (BID) | ORAL | Status: DC
  Administered 2022-05-19: 81 mg via ORAL
  Filled 2022-05-18: qty 1

## 2022-05-18 MED ORDER — PROPOFOL 10 MG/ML IV BOLUS
INTRAVENOUS | Status: AC
  Filled 2022-05-18: qty 20

## 2022-05-18 MED ORDER — ORAL CARE MOUTH RINSE: 15.0000 mL | Freq: Once | OROMUCOSAL | Status: AC

## 2022-05-18 MED ORDER — PAROXETINE HCL 20 MG PO TABS
20.0000 mg | ORAL_TABLET | Freq: Every day | ORAL | Status: DC
  Administered 2022-05-18: 20 mg via ORAL
  Filled 2022-05-18: qty 1

## 2022-05-18 MED ORDER — DIPHENHYDRAMINE HCL 12.5 MG/5ML PO ELIX: 12.5000 mg | ORAL_SOLUTION | ORAL | Status: DC | PRN

## 2022-05-18 MED ORDER — LORAZEPAM 0.5 MG PO TABS: 0.5000 mg | ORAL_TABLET | Freq: Three times a day (TID) | ORAL | Status: DC | PRN

## 2022-05-18 MED ORDER — CEFAZOLIN SODIUM-DEXTROSE 2-4 GM/100ML-% IV SOLN
2.0000 g | INTRAVENOUS | Status: AC
  Administered 2022-05-18: 2 g via INTRAVENOUS
  Filled 2022-05-18: qty 100

## 2022-05-18 MED ORDER — LACTATED RINGERS IV SOLN: INTRAVENOUS | Status: DC

## 2022-05-18 MED ORDER — ONDANSETRON HCL 4 MG PO TABS: 4.0000 mg | ORAL_TABLET | Freq: Four times a day (QID) | ORAL | Status: DC | PRN

## 2022-05-18 MED ORDER — LINACLOTIDE 72 MCG PO CAPS
72.0000 ug | ORAL_CAPSULE | Freq: Two times a day (BID) | ORAL | Status: DC
  Administered 2022-05-18 – 2022-05-19 (×2): 72 ug via ORAL
  Filled 2022-05-18 (×2): qty 1

## 2022-05-18 MED ORDER — LORAZEPAM 0.5 MG PO TABS
0.2500 mg | ORAL_TABLET | Freq: Every day | ORAL | Status: DC
  Administered 2022-05-18: 0.5 mg via ORAL
  Filled 2022-05-18: qty 1

## 2022-05-18 MED ORDER — TRANEXAMIC ACID-NACL 1000-0.7 MG/100ML-% IV SOLN
1000.0000 mg | Freq: Once | INTRAVENOUS | Status: AC
  Administered 2022-05-18: 1000 mg via INTRAVENOUS
  Filled 2022-05-18: qty 100

## 2022-05-18 MED ORDER — SODIUM CHLORIDE 0.9 % IV SOLN
INTRAVENOUS | Status: DC
Start: 2022-05-18 — End: 2022-05-19

## 2022-05-18 MED ORDER — SODIUM CHLORIDE 0.9 % IR SOLN
Status: DC | PRN
  Administered 2022-05-18: 1000 mL

## 2022-05-18 MED ORDER — VITAMIN B-6 100 MG PO TABS
500.0000 mg | ORAL_TABLET | Freq: Two times a day (BID) | ORAL | Status: DC
  Administered 2022-05-18: 500 mg via ORAL
  Filled 2022-05-18 (×2): qty 5

## 2022-05-18 MED ORDER — SENNOSIDES-DOCUSATE SODIUM 8.6-50 MG PO TABS
2.0000 | ORAL_TABLET | Freq: Every day | ORAL | Status: DC
  Administered 2022-05-18: 2 via ORAL
  Filled 2022-05-18: qty 2

## 2022-05-18 MED ORDER — TRAMADOL HCL 50 MG PO TABS: 50.0000 mg | ORAL_TABLET | Freq: Four times a day (QID) | ORAL | Status: AC

## 2022-05-18 MED ORDER — DROPERIDOL 2.5 MG/ML IJ SOLN: 0.6250 mg | Freq: Once | INTRAMUSCULAR | Status: DC | PRN

## 2022-05-18 MED ORDER — FERROUS SULFATE 325 (65 FE) MG PO TABS
325.0000 mg | ORAL_TABLET | Freq: Three times a day (TID) | ORAL | Status: DC
  Administered 2022-05-19: 325 mg via ORAL
  Filled 2022-05-18: qty 1

## 2022-05-18 MED ORDER — MIRTAZAPINE 7.5 MG PO TABS
7.5000 mg | ORAL_TABLET | Freq: Every day | ORAL | Status: DC
  Administered 2022-05-18: 7.5 mg via ORAL
  Filled 2022-05-18: qty 1

## 2022-05-18 MED ORDER — METOCLOPRAMIDE HCL 5 MG PO TABS: 5.0000 mg | ORAL_TABLET | Freq: Three times a day (TID) | ORAL | Status: DC | PRN

## 2022-05-18 MED ORDER — LEVOTHYROXINE SODIUM 75 MCG PO TABS
75.0000 ug | ORAL_TABLET | Freq: Every day | ORAL | Status: DC
  Administered 2022-05-19: 75 ug via ORAL
  Filled 2022-05-18: qty 1

## 2022-05-18 MED ORDER — LORATADINE 10 MG PO TABS
10.0000 mg | ORAL_TABLET | Freq: Every day | ORAL | Status: DC
  Administered 2022-05-19: 10 mg via ORAL
  Filled 2022-05-18: qty 1

## 2022-05-18 MED ORDER — METHOCARBAMOL 500 MG PO TABS
500.0000 mg | ORAL_TABLET | Freq: Four times a day (QID) | ORAL | Status: DC | PRN
  Administered 2022-05-19 (×2): 500 mg via ORAL
  Filled 2022-05-18 (×2): qty 1

## 2022-05-18 MED ORDER — TRAMADOL HCL 50 MG PO TABS
50.0000 mg | ORAL_TABLET | Freq: Four times a day (QID) | ORAL | Status: DC | PRN
  Administered 2022-05-18: 50 mg via ORAL
  Administered 2022-05-19 (×3): 100 mg via ORAL
  Filled 2022-05-18 (×2): qty 2
  Filled 2022-05-18: qty 1
  Filled 2022-05-18: qty 2

## 2022-05-18 MED ORDER — PANTOPRAZOLE SODIUM 40 MG PO TBEC
40.0000 mg | DELAYED_RELEASE_TABLET | Freq: Every day | ORAL | Status: DC
  Administered 2022-05-19: 40 mg via ORAL
  Filled 2022-05-18: qty 1

## 2022-05-18 MED ORDER — VALACYCLOVIR HCL 500 MG PO TABS: 2000.0000 mg | ORAL_TABLET | ORAL | Status: DC

## 2022-05-18 MED ORDER — MENTHOL 3 MG MT LOZG: 1.0000 | LOZENGE | OROMUCOSAL | Status: DC | PRN

## 2022-05-18 MED ORDER — BISACODYL 10 MG RE SUPP: 10.0000 mg | Freq: Every day | RECTAL | Status: DC | PRN

## 2022-05-18 MED ORDER — PRIMIDONE 50 MG PO TABS
50.0000 mg | ORAL_TABLET | Freq: Two times a day (BID) | ORAL | Status: DC
  Administered 2022-05-18 – 2022-05-19 (×2): 50 mg via ORAL
  Filled 2022-05-18 (×2): qty 1

## 2022-05-18 MED ORDER — ROSUVASTATIN CALCIUM 20 MG PO TABS
40.0000 mg | ORAL_TABLET | Freq: Every day | ORAL | Status: DC
  Filled 2022-05-18: qty 2

## 2022-05-18 MED ORDER — ACETAMINOPHEN 500 MG PO TABS
1000.0000 mg | ORAL_TABLET | Freq: Four times a day (QID) | ORAL | Status: DC
  Administered 2022-05-18 – 2022-05-19 (×4): 1000 mg via ORAL
  Filled 2022-05-18 (×4): qty 2

## 2022-05-18 MED ORDER — CHLORHEXIDINE GLUCONATE 0.12 % MT SOLN
15.0000 mL | Freq: Once | OROMUCOSAL | Status: AC
  Administered 2022-05-18: 15 mL via OROMUCOSAL

## 2022-05-18 MED ORDER — PROPOFOL 10 MG/ML IV BOLUS
INTRAVENOUS | Status: DC | PRN
  Administered 2022-05-18: 20 mg via INTRAVENOUS

## 2022-05-18 MED ORDER — TRAMADOL HCL 50 MG PO TABS
ORAL_TABLET | ORAL | Status: AC
  Administered 2022-05-18: 50 mg via ORAL
  Filled 2022-05-18: qty 1

## 2022-05-18 MED ORDER — PROPOFOL 500 MG/50ML IV EMUL
INTRAVENOUS | Status: DC | PRN
  Administered 2022-05-18: 75 ug/kg/min via INTRAVENOUS

## 2022-05-18 MED ORDER — FENTANYL CITRATE (PF) 250 MCG/5ML IJ SOLN
INTRAMUSCULAR | Status: AC
  Filled 2022-05-18: qty 5

## 2022-05-18 MED ORDER — FENTANYL CITRATE (PF) 250 MCG/5ML IJ SOLN
INTRAMUSCULAR | Status: DC | PRN
  Administered 2022-05-18 (×2): 25 ug via INTRAVENOUS

## 2022-05-18 MED ORDER — DOCUSATE SODIUM 100 MG PO CAPS
100.0000 mg | ORAL_CAPSULE | Freq: Two times a day (BID) | ORAL | Status: DC
  Administered 2022-05-18 – 2022-05-19 (×2): 100 mg via ORAL
  Filled 2022-05-18 (×2): qty 1

## 2022-05-18 MED ORDER — HYDROMORPHONE HCL 1 MG/ML IJ SOLN: 0.2500 mg | INTRAMUSCULAR | Status: DC | PRN

## 2022-05-18 MED ORDER — ONDANSETRON HCL 4 MG/2ML IJ SOLN
4.0000 mg | Freq: Four times a day (QID) | INTRAMUSCULAR | Status: DC | PRN
  Administered 2022-05-19: 4 mg via INTRAVENOUS
  Filled 2022-05-18: qty 2

## 2022-05-18 MED ORDER — PHENYLEPHRINE HCL-NACL 20-0.9 MG/250ML-% IV SOLN
INTRAVENOUS | Status: DC | PRN
  Administered 2022-05-18: 25 ug/min via INTRAVENOUS

## 2022-05-18 MED ORDER — FESOTERODINE FUMARATE ER 8 MG PO TB24
8.0000 mg | ORAL_TABLET | Freq: Every day | ORAL | Status: DC
  Administered 2022-05-19: 8 mg via ORAL
  Filled 2022-05-18: qty 1

## 2022-05-18 MED ORDER — STERILE WATER FOR IRRIGATION IR SOLN
Status: DC | PRN
  Administered 2022-05-18: 2000 mL

## 2022-05-18 MED ORDER — LITHIUM CARBONATE 300 MG PO CAPS
300.0000 mg | ORAL_CAPSULE | Freq: Every day | ORAL | Status: DC
  Administered 2022-05-18: 300 mg via ORAL
  Filled 2022-05-18: qty 1

## 2022-05-18 MED ORDER — CEFAZOLIN SODIUM-DEXTROSE 2-4 GM/100ML-% IV SOLN
2.0000 g | Freq: Four times a day (QID) | INTRAVENOUS | Status: AC
  Administered 2022-05-18 – 2022-05-19 (×2): 2 g via INTRAVENOUS
  Filled 2022-05-18 (×2): qty 100

## 2022-05-18 MED ORDER — TRANEXAMIC ACID-NACL 1000-0.7 MG/100ML-% IV SOLN
1000.0000 mg | INTRAVENOUS | Status: AC
  Administered 2022-05-18: 1000 mg via INTRAVENOUS
  Filled 2022-05-18: qty 100

## 2022-05-18 MED ORDER — MORPHINE SULFATE (PF) 2 MG/ML IV SOLN: 0.5000 mg | INTRAVENOUS | Status: DC | PRN

## 2022-05-18 MED ORDER — METHOCARBAMOL 500 MG IVPB - SIMPLE MED: 500.0000 mg | Freq: Four times a day (QID) | INTRAVENOUS | Status: DC | PRN

## 2022-05-18 MED ORDER — HYDROMORPHONE HCL 1 MG/ML IJ SOLN
INTRAMUSCULAR | Status: AC
  Administered 2022-05-18: 0.5 mg via INTRAVENOUS
  Filled 2022-05-18: qty 1

## 2022-05-18 SURGICAL SUPPLY — 48 items
ADH SKN CLS APL DERMABOND .7 (GAUZE/BANDAGES/DRESSINGS) ×1
BAG COUNTER SPONGE SURGICOUNT (BAG) ×1 IMPLANT
BAG DECANTER FOR FLEXI CONT (MISCELLANEOUS) IMPLANT
BAG SPEC THK2 15X12 ZIP CLS (MISCELLANEOUS)
BAG SPNG CNTER NS LX DISP (BAG) ×1
BAG ZIPLOCK 12X15 (MISCELLANEOUS) IMPLANT
BLADE SAG 18X100X1.27 (BLADE) ×3 IMPLANT
COVER PERINEAL POST (MISCELLANEOUS) ×2 IMPLANT
COVER SURGICAL LIGHT HANDLE (MISCELLANEOUS) ×2 IMPLANT
CUP ACET PINNACLE SECTR 50MM (Hips) IMPLANT
DERMABOND ADVANCED (GAUZE/BANDAGES/DRESSINGS) ×1
DERMABOND ADVANCED .7 DNX12 (GAUZE/BANDAGES/DRESSINGS) ×1 IMPLANT
DRAPE FOOT SWITCH (DRAPES) ×3 IMPLANT
DRAPE STERI IOBAN 125X83 (DRAPES) ×3 IMPLANT
DRAPE U-SHAPE 47X51 STRL (DRAPES) ×4 IMPLANT
DRESSING AQUACEL AG SP 3.5X10 (GAUZE/BANDAGES/DRESSINGS) ×2 IMPLANT
DRSG AQUACEL AG SP 3.5X10 (GAUZE/BANDAGES/DRESSINGS) ×2
DURAPREP 26ML APPLICATOR (WOUND CARE) ×2 IMPLANT
ELECT REM PT RETURN 15FT ADLT (MISCELLANEOUS) ×3 IMPLANT
ELIMINATOR HOLE APEX DEPUY (Hips) ×1 IMPLANT
GLOVE BIO SURGEON STRL SZ 6 (GLOVE) ×2 IMPLANT
GLOVE BIOGEL PI IND STRL 6.5 (GLOVE) ×1 IMPLANT
GLOVE BIOGEL PI IND STRL 7.5 (GLOVE) ×1 IMPLANT
GLOVE BIOGEL PI INDICATOR 6.5 (GLOVE) ×1
GLOVE BIOGEL PI INDICATOR 7.5 (GLOVE) ×1
GLOVE ORTHO TXT STRL SZ7.5 (GLOVE) ×4 IMPLANT
GOWN STRL REUS W/ TWL LRG LVL3 (GOWN DISPOSABLE) ×6 IMPLANT
GOWN STRL REUS W/TWL LRG LVL3 (GOWN DISPOSABLE) ×6
HEAD FEM STD 32X+1 STRL (Hips) ×1 IMPLANT
HOLDER FOLEY CATH W/STRAP (MISCELLANEOUS) ×3 IMPLANT
KIT TURNOVER KIT A (KITS) ×1 IMPLANT
LINER ACET PNNCL PLUS4 NEUTRAL (Hips) IMPLANT
PACK ANTERIOR HIP CUSTOM (KITS) ×2 IMPLANT
PINNACLE PLUS 4 NEUTRAL (Hips) ×2 IMPLANT
PINNACLE SECTOR CUP 50MM (Hips) ×2 IMPLANT
SCREW 6.5MMX25MM (Screw) ×1 IMPLANT
STEM FEM ACTIS HIGH SZ3 (Stem) ×1 IMPLANT
SUT MNCRL AB 4-0 PS2 18 (SUTURE) ×2 IMPLANT
SUT STRATAFIX 0 PDS 27 VIOLET (SUTURE) ×2
SUT VIC AB 1 CT1 36 (SUTURE) ×6 IMPLANT
SUT VIC AB 2-0 CT1 27 (SUTURE) ×4
SUT VIC AB 2-0 CT1 TAPERPNT 27 (SUTURE) ×4 IMPLANT
SUT VIC AB 3-0 CT1 27 (SUTURE) ×2
SUT VIC AB 3-0 CT1 TAPERPNT 27 (SUTURE) IMPLANT
SUTURE STRATFX 0 PDS 27 VIOLET (SUTURE) ×2 IMPLANT
TRAY FOLEY MTR SLVR 16FR STAT (SET/KITS/TRAYS/PACK) ×1 IMPLANT
TUBE SUCTION HIGH CAP CLEAR NV (SUCTIONS) ×2 IMPLANT
WATER STERILE IRR 1000ML POUR (IV SOLUTION) ×2 IMPLANT

## 2022-05-18 NOTE — Anesthesia Procedure Notes (Addendum)
Spinal  Patient location during procedure: OR Start time: 05/18/2022 11:46 AM End time: 05/18/2022 11:54 AM Reason for block: surgical anesthesia Staffing Performed: anesthesiologist  Anesthesiologist: Nolon Nations, MD Performed by: Nolon Nations, MD Authorized by: Nolon Nations, MD   Preanesthetic Checklist Completed: patient identified, IV checked, site marked, risks and benefits discussed, surgical consent, monitors and equipment checked, pre-op evaluation and timeout performed Spinal Block Patient position: sitting Prep: DuraPrep and site prepped and draped Patient monitoring: heart rate, continuous pulse ox and blood pressure Approach: right paramedian Location: L3-4 Injection technique: single-shot Needle Needle type: Spinocan  Needle gauge: 25 G Needle length: 9 cm Additional Notes Expiration date of kit checked and confirmed. Patient tolerated procedure well, without complications.

## 2022-05-18 NOTE — Anesthesia Postprocedure Evaluation (Signed)
Anesthesia Post Note  Patient: Meghan Oliver  Procedure(s) Performed: TOTAL HIP ARTHROPLASTY ANTERIOR APPROACH (Right: Hip)     Patient location during evaluation: PACU Anesthesia Type: Spinal Level of consciousness: sedated and patient cooperative Pain management: pain level controlled Vital Signs Assessment: post-procedure vital signs reviewed and stable Respiratory status: spontaneous breathing Cardiovascular status: stable Anesthetic complications: no   No notable events documented.  Last Vitals:  Vitals:   05/18/22 1515 05/18/22 1547  BP: (!) 119/56 (!) 126/59  Pulse: (!) 56 61  Resp: 18 17  Temp:    SpO2: 100% 97%    Last Pain:  Vitals:   05/18/22 1547  TempSrc:   PainSc: 2                  Nolon Nations

## 2022-05-18 NOTE — Plan of Care (Signed)

## 2022-05-18 NOTE — Discharge Instructions (Signed)
INSTRUCTIONS AFTER JOINT REPLACEMENT   Tramadol is a medication that can affect serotonin levels.  As a precaution to anyone starting new medications that affect their serotonin, I just want to make sure that you are aware of a rare, but serious side effect of starting another serotonin medication. Serotonin syndrome can cause: mental status changes (confusion/agitation), high blood pressure, increased heart rate, vomiting, diarrhea, seizures, and other symptoms. If you feel like this is happening, stop all meds and proceed to the ER. Again, this is rare, but can be serious if untreated.   Remove items at home which could result in a fall. This includes throw rugs or furniture in walking pathways ICE to the affected joint every three hours while awake for 30 minutes at a time, for at least the first 3-5 days, and then as needed for pain and swelling.  Continue to use ice for pain and swelling. You may notice swelling that will progress down to the foot and ankle.  This is normal after surgery.  Elevate your leg when you are not up walking on it.   Continue to use the breathing machine you got in the hospital (incentive spirometer) which will help keep your temperature down.  It is common for your temperature to cycle up and down following surgery, especially at night when you are not up moving around and exerting yourself.  The breathing machine keeps your lungs expanded and your temperature down.   DIET:  As you were doing prior to hospitalization, we recommend a well-balanced diet.  DRESSING / WOUND CARE / SHOWERING  Keep the surgical dressing until follow up.  The dressing is water proof, so you can shower without any extra covering.  IF THE DRESSING FALLS OFF or the wound gets wet inside, change the dressing with sterile gauze.  Please use good hand washing techniques before changing the dressing.  Do not use any lotions or creams on the incision until instructed by your surgeon.     ACTIVITY  Increase activity slowly as tolerated, but follow the weight bearing instructions below.   No driving for 6 weeks or until further direction given by your physician.  You cannot drive while taking narcotics.  No lifting or carrying greater than 10 lbs. until further directed by your surgeon. Avoid periods of inactivity such as sitting longer than an hour when not asleep. This helps prevent blood clots.  You may return to work once you are authorized by your doctor.     WEIGHT BEARING   Weight bearing as tolerated with assist device (walker, cane, etc) as directed, use it as long as suggested by your surgeon or therapist, typically at least 4-6 weeks.   EXERCISES  Results after joint replacement surgery are often greatly improved when you follow the exercise, range of motion and muscle strengthening exercises prescribed by your doctor. Safety measures are also important to protect the joint from further injury. Any time any of these exercises cause you to have increased pain or swelling, decrease what you are doing until you are comfortable again and then slowly increase them. If you have problems or questions, call your caregiver or physical therapist for advice.   Rehabilitation is important following a joint replacement. After just a few days of immobilization, the muscles of the leg can become weakened and shrink (atrophy).  These exercises are designed to build up the tone and strength of the thigh and leg muscles and to improve motion. Often times heat used for twenty  to thirty minutes before working out will loosen up your tissues and help with improving the range of motion but do not use heat for the first two weeks following surgery (sometimes heat can increase post-operative swelling).   These exercises can be done on a training (exercise) mat, on the floor, on a table or on a bed. Use whatever works the best and is most comfortable for you.    Use music or television  while you are exercising so that the exercises are a pleasant break in your day. This will make your life better with the exercises acting as a break in your routine that you can look forward to.   Perform all exercises about fifteen times, three times per day or as directed.  You should exercise both the operative leg and the other leg as well.  Exercises include:   Quad Sets - Tighten up the muscle on the front of the thigh (Quad) and hold for 5-10 seconds.   Straight Leg Raises - With your knee straight (if you were given a brace, keep it on), lift the leg to 60 degrees, hold for 3 seconds, and slowly lower the leg.  Perform this exercise against resistance later as your leg gets stronger.  Leg Slides: Lying on your back, slowly slide your foot toward your buttocks, bending your knee up off the floor (only go as far as is comfortable). Then slowly slide your foot back down until your leg is flat on the floor again.  Angel Wings: Lying on your back spread your legs to the side as far apart as you can without causing discomfort.  Hamstring Strength:  Lying on your back, push your heel against the floor with your leg straight by tightening up the muscles of your buttocks.  Repeat, but this time bend your knee to a comfortable angle, and push your heel against the floor.  You may put a pillow under the heel to make it more comfortable if necessary.   A rehabilitation program following joint replacement surgery can speed recovery and prevent re-injury in the future due to weakened muscles. Contact your doctor or a physical therapist for more information on knee rehabilitation.    CONSTIPATION  Constipation is defined medically as fewer than three stools per week and severe constipation as less than one stool per week.  Even if you have a regular bowel pattern at home, your normal regimen is likely to be disrupted due to multiple reasons following surgery.  Combination of anesthesia, postoperative  narcotics, change in appetite and fluid intake all can affect your bowels.   YOU MUST use at least one of the following options; they are listed in order of increasing strength to get the job done.  They are all available over the counter, and you may need to use some, POSSIBLY even all of these options:    Drink plenty of fluids (prune juice may be helpful) and high fiber foods Colace 100 mg by mouth twice a day  Senokot for constipation as directed and as needed Dulcolax (bisacodyl), take with full glass of water  Miralax (polyethylene glycol) once or twice a day as needed.  If you have tried all these things and are unable to have a bowel movement in the first 3-4 days after surgery call either your surgeon or your primary doctor.    If you experience loose stools or diarrhea, hold the medications until you stool forms back up.  If your symptoms do not get  better within 1 week or if they get worse, check with your doctor.  If you experience "the worst abdominal pain ever" or develop nausea or vomiting, please contact the office immediately for further recommendations for treatment.   ITCHING:  If you experience itching with your medications, try taking only a single pain pill, or even half a pain pill at a time.  You can also use Benadryl over the counter for itching or also to help with sleep.   TED HOSE STOCKINGS:  Use stockings on both legs until for at least 2 weeks or as directed by physician office. They may be removed at night for sleeping.  MEDICATIONS:  See your medication summary on the "After Visit Summary" that nursing will review with you.  You may have some home medications which will be placed on hold until you complete the course of blood thinner medication.  It is important for you to complete the blood thinner medication as prescribed.  PRECAUTIONS:  If you experience chest pain or shortness of breath - call 911 immediately for transfer to the hospital emergency department.    If you develop a fever greater that 101 F, purulent drainage from wound, increased redness or drainage from wound, foul odor from the wound/dressing, or calf pain - CONTACT YOUR SURGEON.                                                   FOLLOW-UP APPOINTMENTS:  If you do not already have a post-op appointment, please call the office for an appointment to be seen by your surgeon.  Guidelines for how soon to be seen are listed in your "After Visit Summary", but are typically between 1-4 weeks after surgery.  OTHER INSTRUCTIONS:   Knee Replacement:  Do not place pillow under knee, focus on keeping the knee straight while resting. CPM instructions: 0-90 degrees, 2 hours in the morning, 2 hours in the afternoon, and 2 hours in the evening. Place foam block, curve side up under heel at all times except when in CPM or when walking.  DO NOT modify, tear, cut, or change the foam block in any way.  POST-OPERATIVE OPIOID TAPER INSTRUCTIONS: It is important to wean off of your opioid medication as soon as possible. If you do not need pain medication after your surgery it is ok to stop day one. Opioids include: Codeine, Hydrocodone(Norco, Vicodin), Oxycodone(Percocet, oxycontin) and hydromorphone amongst others.  Long term and even short term use of opiods can cause: Increased pain response Dependence Constipation Depression Respiratory depression And more.  Withdrawal symptoms can include Flu like symptoms Nausea, vomiting And more Techniques to manage these symptoms Hydrate well Eat regular healthy meals Stay active Use relaxation techniques(deep breathing, meditating, yoga) Do Not substitute Alcohol to help with tapering If you have been on opioids for less than two weeks and do not have pain than it is ok to stop all together.  Plan to wean off of opioids This plan should start within one week post op of your joint replacement. Maintain the same interval or time between taking each dose  and first decrease the dose.  Cut the total daily intake of opioids by one tablet each day Next start to increase the time between doses. The last dose that should be eliminated is the evening dose.   MAKE SURE YOU:  Understand these instructions.  Get help right away if you are not doing well or get worse.    Thank you for letting us be a part of your medical care team.  It is a privilege we respect greatly.  We hope these instructions will help you stay on track for a fast and full recovery!

## 2022-05-18 NOTE — Anesthesia Procedure Notes (Signed)
Procedure Name: MAC Date/Time: 05/18/2022 11:55 AM  Performed by: Colin Benton, CRNAPre-anesthesia Checklist: Patient identified, Emergency Drugs available, Suction available and Patient being monitored Patient Re-evaluated:Patient Re-evaluated prior to induction Oxygen Delivery Method: Simple face mask Induction Type: IV induction Placement Confirmation: positive ETCO2 Dental Injury: Teeth and Oropharynx as per pre-operative assessment

## 2022-05-18 NOTE — Interval H&P Note (Signed)
History and Physical Interval Note:  05/18/2022 10:54 AM  Meghan Oliver  has presented today for surgery, with the diagnosis of Right hip osteoarthritis.  The various methods of treatment have been discussed with the patient and family. After consideration of risks, benefits and other options for treatment, the patient has consented to  Procedure(s): TOTAL HIP ARTHROPLASTY ANTERIOR APPROACH (Right) as a surgical intervention.  The patient's history has been reviewed, patient examined, no change in status, stable for surgery.  I have reviewed the patient's chart and labs.  Questions were answered to the patient's satisfaction.     Mauri Pole

## 2022-05-18 NOTE — Care Plan (Signed)
Ortho Bundle Case Management Note  Patient Details  Name: Meghan Oliver MRN: [id number] Date of Birth: 04/03/1941                  R THA on 05-18-22 DCP: Home with husband DME: RW ordered through [facility] PT: HEP   DME Arranged:  Gilford Rile rolling DME Agency:  [employer]  HH Arranged:    [place] Agency:     Additional Comments: Please contact me with any questions of if this plan should need to change.  Marianne Sofia, RN,CCM [facility]  [phone] 05/18/2022, 2:44 PM

## 2022-05-18 NOTE — Transfer of Care (Signed)
Immediate Anesthesia Transfer of Care Note  Patient: Meghan Oliver  Procedure(s) Performed: TOTAL HIP ARTHROPLASTY ANTERIOR APPROACH (Right: Hip)  Patient Location: PACU  Anesthesia Type:Spinal  Level of Consciousness: awake, alert , oriented and patient cooperative  Airway & Oxygen Therapy: Patient Spontanous Breathing  Post-op Assessment: Report given to RN and Post -op Vital signs reviewed and stable  Post vital signs: Reviewed and stable  Last Vitals:  Vitals Value Taken Time  BP 115/53 05/18/22 1315  Temp    Pulse 59 05/18/22 1317  Resp 9 05/18/22 1317  SpO2 100 % 05/18/22 1317  Vitals shown include unvalidated device data.  Last Pain:  Vitals:   05/18/22 1104  TempSrc:   PainSc: 0-No pain      Patients Stated Pain Goal: 4 (92/95/74 7340)  Complications: No notable events documented.

## 2022-05-18 NOTE — Op Note (Signed)
NAME:  Meghan Oliver                ACCOUNT NO.: [id number]      MEDICAL RECORD NO.: [id number]      FACILITY:  [facility]      PHYSICIAN:  Mauri Pole  DATE OF BIRTH:  [birth year]     DATE OF PROCEDURE:  05/18/2022                                 OPERATIVE REPORT         PREOPERATIVE DIAGNOSIS: Right  hip osteoarthritis.      POSTOPERATIVE DIAGNOSIS:  Right hip osteoarthritis.      PROCEDURE:  Right total hip replacement through an anterior approach   utilizing DePuy THR system, component size 50 mm pinnacle cup, a size 32+4 neutral   Altrex liner, a size 3 Hi Actis stem with a 32+1 Articuleze metal ball.      SURGEON:  Pietro Cassis. Alvan Dame, M.D.      ASSISTANT:  Costella Hatcher, PA-C     ANESTHESIA:  Spinal.      SPECIMENS:  None.      COMPLICATIONS:  None.      BLOOD LOSS:  250 cc     DRAINS:  None.      INDICATION OF THE PROCEDURE:  Meghan Oliver is a 81 y.o. female who had   presented to office for evaluation of right hip pain.  Radiographs revealed   progressive degenerative changes with bone-on-bone   articulation of the  hip joint, including subchondral cystic changes and osteophytes.  The patient had painful limited range of   motion significantly affecting their overall quality of life and function.  The patient was failing to    respond to conservative measures including medications and/or injections and activity modification and at this point was ready   to proceed with more definitive measures.  Consent was obtained for   benefit of pain relief.  Specific risks of infection, DVT, component   failure, dislocation, neurovascular injury, and need for revision surgery were reviewed in the office.     PROCEDURE IN DETAIL:  The patient was brought to operative theater.   Once adequate anesthesia, preoperative antibiotics, 2 gm of Ancef, 1 gm of Tranexamic Acid, and 10 mg of Decadron were administered, the patient was positioned supine on the [employer] table.  Once the patient was safely positioned with adequate padding of boney prominences we predraped out the hip, and used fluoroscopy to confirm orientation of the pelvis.      The right hip was then prepped and draped from proximal iliac crest to   mid thigh with a shower curtain technique.      Time-out was performed identifying the patient, planned procedure, and the appropriate extremity.     An incision was then made 2 cm lateral to the   anterior superior iliac spine extending over the orientation of the   tensor fascia lata muscle and sharp dissection was carried down to the   fascia of the muscle.      The fascia was then incised.  The muscle belly was identified and swept   laterally and retractor placed along the superior neck.  Following   cauterization of the circumflex vessels and removing some pericapsular   fat, a second cobra retractor was placed on the inferior neck.  A T-capsulotomy was made along the line of the   superior neck to the trochanteric fossa, then extended proximally and   distally.  Tag sutures were placed and the retractors were then placed   intracapsular.  We then identified the trochanteric fossa and   orientation of my neck cut and then made a neck osteotomy with the femur on traction.  The femoral   head was removed without difficulty or complication.  Traction was let   off and retractors were placed posterior and anterior around the   acetabulum.      The labrum and foveal tissue were debrided.  I began reaming with a 45 mm   reamer and reamed up to 49 mm reamer with good bony bed preparation and a 50 mm  cup was chosen.  The final 50 mm Pinnacle cup was then impacted under fluoroscopy to confirm the depth of penetration and orientation with respect to   Abduction and forward flexion.  A screw was placed into the ilium followed by the hole eliminator.  The final   32+4 neutral Altrex liner was impacted with good visualized rim fit.  The cup  was positioned anatomically within the acetabular portion of the pelvis.      At this point, the femur was rolled to 100 degrees.  Further capsule was   released off the inferior aspect of the femoral neck.  I then   released the superior capsule proximally.  With the leg in a neutral position the hook was placed laterally   along the femur under the vastus lateralis origin and elevated manually and then held in position using the hook attachment on the bed.  The leg was then extended and adducted with the leg rolled to 100   degrees of external rotation.  Retractors were placed along the medial calcar and posteriorly over the greater trochanter.  Once the proximal femur was fully   exposed, I used a box osteotome to set orientation.  I then began   broaching with the starting chili pepper broach and passed this by hand and then broached up to 3.  With the 3 broach in place I chose a high offset neck and did several trial reductions.  The offset was appropriate, leg lengths   appeared to be equal best matched with the +1 head ball trial confirmed radiographically.   Given these findings, I went ahead and dislocated the hip, repositioned all   retractors and positioned the right hip in the extended and abducted position.  The final 3 Hi Actis stem was   chosen and it was impacted down to the level of neck cut.  Based on this   and the trial reductions, a final 32+1 Articuleze metal head ball was chosen and   impacted onto a clean and dry trunnion, and the hip was reduced.  The   hip had been irrigated throughout the case again at this point.  I did   reapproximate the superior capsular leaflet to the anterior leaflet   using #1 Vicryl.  The fascia of the   tensor fascia lata muscle was then reapproximated using #1 Vicryl and #0 Stratafix sutures.  The   remaining wound was closed with 2-0 Vicryl and running 4-0 Monocryl.   The hip was cleaned, dried, and dressed sterilely using Dermabond and    Aquacel dressing.  The patient was then brought   to recovery room in stable condition tolerating the procedure well.    Costella Hatcher,  PA-C was present for the entirety of the case involved from   preoperative positioning, perioperative retractor management, general   facilitation of the case, as well as primary wound closure as assistant.            Pietro Cassis Alvan Dame, M.D.        05/18/2022 11:55 AM

## 2022-05-19 ENCOUNTER — Other Ambulatory Visit (HOSPITAL_BASED_OUTPATIENT_CLINIC_OR_DEPARTMENT_OTHER): Payer: Self-pay

## 2022-05-19 ENCOUNTER — Encounter (HOSPITAL_COMMUNITY): Payer: Self-pay | Admitting: Orthopedic Surgery

## 2022-05-19 DIAGNOSIS — M1611 Unilateral primary osteoarthritis, right hip: Secondary | ICD-10-CM | POA: Diagnosis not present

## 2022-05-19 DIAGNOSIS — Z950 Presence of cardiac pacemaker: Secondary | ICD-10-CM | POA: Diagnosis not present

## 2022-05-19 DIAGNOSIS — I129 Hypertensive chronic kidney disease with stage 1 through stage 4 chronic kidney disease, or unspecified chronic kidney disease: Secondary | ICD-10-CM | POA: Diagnosis not present

## 2022-05-19 DIAGNOSIS — Z955 Presence of coronary angioplasty implant and graft: Secondary | ICD-10-CM | POA: Diagnosis not present

## 2022-05-19 DIAGNOSIS — N182 Chronic kidney disease, stage 2 (mild): Secondary | ICD-10-CM | POA: Diagnosis not present

## 2022-05-19 DIAGNOSIS — I2510 Atherosclerotic heart disease of native coronary artery without angina pectoris: Secondary | ICD-10-CM | POA: Diagnosis not present

## 2022-05-19 LAB — BASIC METABOLIC PANEL
Anion gap: 4 — ABNORMAL LOW (ref 5–15)
BUN: 17 mg/dL (ref 8–23)
CO2: 24 mmol/L (ref 22–32)
Calcium: 7.9 mg/dL — ABNORMAL LOW (ref 8.9–10.3)
Chloride: 108 mmol/L (ref 98–111)
Creatinine, Ser: 0.68 mg/dL (ref 0.44–1.00)
GFR, Estimated: >60 mL/min (ref >60)
Glucose, Bld: 96 mg/dL (ref 70–99)
Potassium: 3.8 mmol/L (ref 3.5–5.1)
Sodium: 136 mmol/L (ref 135–145)

## 2022-05-19 LAB — CBC
HCT: 30 % — ABNORMAL LOW (ref 36.0–46.0)
Hemoglobin: 9.7 g/dL — ABNORMAL LOW (ref 12.0–15.0)
MCH: 32.9 pg (ref 26.0–34.0)
MCHC: 32.3 g/dL (ref 30.0–36.0)
MCV: 101.7 fL — ABNORMAL HIGH (ref 80.0–100.0)
Platelets: 179 10*3/uL (ref 150–400)
RBC: 2.95 MIL/uL — ABNORMAL LOW (ref 3.87–5.11)
RDW: 12.8 % (ref 11.5–15.5)
WBC: 7.5 10*3/uL (ref 4.0–10.5)
nRBC: 0 % (ref 0.0–0.2)

## 2022-05-19 MED ORDER — METHOCARBAMOL 500 MG PO TABS
500.0000 mg | ORAL_TABLET | Freq: Four times a day (QID) | ORAL | 0 refills | Status: DC | PRN
  Filled 2022-05-19: qty 40, 10d supply, fill #0

## 2022-05-19 MED ORDER — ACETAMINOPHEN 500 MG PO TABS: 1000.0000 mg | ORAL_TABLET | Freq: Four times a day (QID) | ORAL | 0 refills | Status: DC

## 2022-05-19 MED ORDER — TRAMADOL HCL 50 MG PO TABS
50.0000 mg | ORAL_TABLET | Freq: Four times a day (QID) | ORAL | 0 refills | Status: DC | PRN
  Filled 2022-05-19: qty 42, 6d supply, fill #0

## 2022-05-19 MED ORDER — ASPIRIN 81 MG PO CHEW
81.0000 mg | CHEWABLE_TABLET | Freq: Two times a day (BID) | ORAL | 0 refills | Status: DC
  Filled 2022-05-19: qty 90, 45d supply, fill #0

## 2022-05-19 NOTE — Plan of Care (Signed)

## 2022-05-19 NOTE — Progress Notes (Signed)
Physical Therapy Treatment Patient Details Name: Meghan Oliver MRN: [id number] DOB: October 06, 1940 Today's Date: 05/19/2022   History of Present Illness Pt is an 81 year old female s/p Right THA on 05/18/22.  PMHx significant for but not limited to: memory loss, bipolar II, PPM, CHF, chronic low back pain, HTN    PT Comments    Pt ambulated in hallway and able to improve distance.  Pt also performed a few LE exercises and provided with HEP.  Reviewed HEP with pt's home RN and explained how to progress and not to start standing exercises for a couple days.  Pt to take gait belt home.  Pt's spouse and daughter also present during session.  Pt and family/caregiver had no further questions, and pt feels ready for d/c home today.    Recommendations for follow up therapy are one component of a multi-disciplinary discharge planning process, led by the attending physician.  Recommendations may be updated based on patient status, additional functional criteria and insurance authorization.  Follow Up Recommendations  Follow physician's recommendations for discharge plan and follow up therapies     Assistance Recommended at Discharge Frequent or constant Supervision/Assistance  Patient can return home with the following A little help with walking and/or transfers;A little help with bathing/dressing/bathroom;Assist for transportation;Help with stairs or ramp for entrance   Equipment Recommendations  None recommended by PT    Recommendations for Other Services       Precautions / Restrictions Precautions Precautions: Fall Restrictions RLE Weight Bearing: Weight bearing as tolerated     Mobility  Bed Mobility Overal bed mobility: Needs Assistance Bed Mobility: Supine to Sit     Supine to sit: Mod assist     General bed mobility comments: pt in recliner    Transfers Overall transfer level: Needs assistance Equipment used: Rolling walker (2 wheels) Transfers: Sit to/from Stand Sit to  Stand: Min guard           General transfer comment: verbal cues for UE and LE positioning    Ambulation/Gait Ambulation/Gait assistance: Min guard Gait Distance (Feet): 100 Feet Assistive device: Rolling walker (2 wheels) Gait Pattern/deviations: Step-to pattern, Decreased stance time - right, Antalgic       General Gait Details: verbal cues for sequence, RW positioning, step length, posture   Stairs             Wheelchair Mobility    Modified Rankin (Stroke Patients Only)       Balance                                            Cognition Arousal/Alertness: Awake/alert Behavior During Therapy: WFL for tasks assessed/performed Overall Cognitive Status: Within Functional Limits for tasks assessed                                          Exercises Total Joint Exercises Ankle Circles/Pumps: AROM, Both, 10 reps Quad Sets: AROM, Right, 10 reps Heel Slides: AAROM, Right, 5 reps Hip ABduction/ADduction: AAROM, Right, 5 reps Long Arc Quad: AROM, Seated, Right, 10 reps    General Comments        Pertinent Vitals/Pain Pain Assessment Pain Assessment: 0-10 Pain Score: 5  Pain Location: right hip Pain Descriptors / Indicators: Grimacing, Sore, Aching Pain Intervention(s):  Repositioned, Monitored during session    Home Living                          Prior Function            PT Goals (current goals can now be found in the care plan section) Progress towards PT goals: Progressing toward goals    Frequency    7X/week      PT Plan Current plan remains appropriate    Co-evaluation              AM-PAC PT "6 Clicks" Mobility   Outcome Measure  Help needed turning from your back to your side while in a flat bed without using bedrails?: A Little Help needed moving from lying on your back to sitting on the side of a flat bed without using bedrails?: A Little Help needed moving to and from a bed  to a chair (including a wheelchair)?: A Little Help needed standing up from a chair using your arms (e.g., wheelchair or bedside chair)?: A Little Help needed to walk in hospital room?: A Little Help needed climbing 3-5 steps with a railing? : A Lot 6 Click Score: 17    End of Session   Activity Tolerance: Patient tolerated treatment well Patient left: in chair;with call bell/phone within reach;with family/visitor present   PT Visit Diagnosis: Difficulty in walking, not elsewhere classified (R26.2)     Time: 1916-6060 PT Time Calculation (min) (ACUTE ONLY): 26 min  Charges:  $Gait Training: 8-22 mins $Therapeutic Exercise: 8-22 mins                    Meghan Oliver PT, DPT Physical Therapist Acute Rehabilitation Services Preferred contact method: Secure Chat Weekend Pager Only: [phone] Office: [phone]    Meghan Oliver Meghan Oliver 05/19/2022, 3:42 PM

## 2022-05-19 NOTE — Evaluation (Signed)
Physical Therapy Evaluation Patient Details Name: Meghan Oliver MRN: [id number] DOB: November 17, 1940 Today's Date: 05/19/2022  History of Present Illness  Pt is an 81 year old female s/p Right THA on 05/18/22.  PMHx significant for but not limited to: memory loss, bipolar II, PPM, CHF, chronic low back pain, HTN  Clinical Impression  Pt is s/p THA resulting in the deficits listed below (see PT Problem List).  Pt will benefit from skilled PT to increase their independence and safety with mobility to allow discharge to the venue listed below.  Pt assisted with ambulating short distance limited by fatigue and nausea.   Pt plans to d/c home with private RN assist.      Recommendations for follow up therapy are one component of a multi-disciplinary discharge planning process, led by the attending physician.  Recommendations may be updated based on patient status, additional functional criteria and insurance authorization.  Follow Up Recommendations Follow physician's recommendations for discharge plan and follow up therapies      Assistance Recommended at Discharge Frequent or constant Supervision/Assistance  Patient can return home with the following  A little help with walking and/or transfers;A little help with bathing/dressing/bathroom;Assist for transportation;Help with stairs or ramp for entrance    Equipment Recommendations None recommended by PT  Recommendations for Other Services       Functional Status Assessment Patient has had a recent decline in their functional status and demonstrates the ability to make significant improvements in function in a reasonable and predictable amount of time.     Precautions / Restrictions Precautions Precautions: Fall Restrictions Weight Bearing Restrictions: No RLE Weight Bearing: Weight bearing as tolerated      Mobility  Bed Mobility Overal bed mobility: Needs Assistance Bed Mobility: Supine to Sit     Supine to sit: Mod assist      General bed mobility comments: assist for LEs over EOB and trunk upright    Transfers Overall transfer level: Needs assistance Equipment used: Rolling walker (2 wheels) Transfers: Sit to/from Stand Sit to Stand: Min assist           General transfer comment: verbal cues for UE and LE positioning, assist to rise and steady    Ambulation/Gait Ambulation/Gait assistance: Min assist, Min guard Gait Distance (Feet): 20 Feet Assistive device: Rolling walker (2 wheels) Gait Pattern/deviations: Step-to pattern, Decreased stance time - right, Antalgic       General Gait Details: verbal cues for sequence, RW positioning, step length, posture; distance limited by fatigue and nausea  Stairs            Wheelchair Mobility    Modified Rankin (Stroke Patients Only)       Balance                                             Pertinent Vitals/Pain Pain Assessment Pain Assessment: 0-10 Pain Score: 7  Pain Location: right hip Pain Descriptors / Indicators: Grimacing, Sore, Aching Pain Intervention(s): Monitored during session, Repositioned, Premedicated before session    Home Living Family/patient expects to be discharged to:: Private residence Living Arrangements: Spouse/significant other Available Help at Discharge: Other (Comment) (private duty RN) Type of Home: House Home Access: Level entry       Home Layout: One [place]: Conservation officer, nature (2 wheels)      Prior Function Prior Level of Function :  Independent/Modified Independent                     Hand Dominance        Extremity/Trunk Assessment        Lower Extremity Assessment Lower Extremity Assessment: RLE deficits/detail RLE Deficits / Details: assist required for right LE due to pain, able to perform ankle pumps       Communication   Communication: No difficulties  Cognition Arousal/Alertness: Awake/alert Behavior During Therapy: Flat affect, WFL for  tasks assessed/performed Overall Cognitive Status: Within Functional Limits for tasks assessed                                          General Comments      Exercises     Assessment/Plan    PT Assessment Patient needs continued PT services  PT Problem List Decreased strength;Decreased activity tolerance;Decreased balance;Decreased mobility;Decreased knowledge of use of DME;Pain       PT Treatment Interventions Stair training;DME instruction;Therapeutic exercise;Gait training;Functional mobility training;Therapeutic activities;Patient/family education    PT Goals (Current goals can be found in the Care Plan section)  Acute Rehab PT Goals PT Goal Formulation: With patient Time For Goal Achievement: 05/23/22 Potential to Achieve Goals: Good    Frequency 7X/week     Co-evaluation               AM-PAC PT "6 Clicks" Mobility  Outcome Measure Help needed turning from your back to your side while in a flat bed without using bedrails?: A Lot Help needed moving from lying on your back to sitting on the side of a flat bed without using bedrails?: A Lot Help needed moving to and from a bed to a chair (including a wheelchair)?: A Little Help needed standing up from a chair using your arms (e.g., wheelchair or bedside chair)?: A Little Help needed to walk in hospital room?: A Little Help needed climbing 3-5 steps with a railing? : A Lot 6 Click Score: 15    End of Session Equipment Utilized During Treatment: Gait belt Activity Tolerance: Patient tolerated treatment well Patient left: in chair;with call bell/phone within reach;with family/visitor present;with chair alarm set   PT Visit Diagnosis: Difficulty in walking, not elsewhere classified (R26.2)    Time: 1245-8099 PT Time Calculation (min) (ACUTE ONLY): 16 min   Charges:   PT Evaluation $PT Eval Low Complexity: 1 Low         Kati PT, DPT Physical Therapist Acute Rehabilitation  Services Preferred contact method: Secure Chat Weekend Pager Only: [phone] Office: [place] 05/19/2022, 11:34 AM

## 2022-05-19 NOTE — TOC Transition Note (Signed)
Transition of Care [facility]) - CM/SW Discharge Note  Patient Details  Name: Meghan Oliver MRN: [id number] Date of Birth: [birth year]  [facility] of [facility]) CM/SW Contact:  Sherie Don, LCSW Phone Number: 05/19/2022, 10:11 AM  Clinical Narrative: Patient is expected to discharge home today after working with PT. CSW met with patient to confirm discharge plan. Patient will go home with a home exercise program (HEP). Patient has a rolling walker at home, so there are no DME needs at this time. TOC signing off.  Final next level of care: Home/Self Care Barriers to Discharge: No Barriers Identified  Patient Goals and CMS Choice Patient states their goals for this hospitalization and ongoing recovery are:: Discharge home with HEP Choice offered to / list presented to : NA  Discharge Plan and Services         DME Arranged: N/A DME Agency: NA  Readmission Risk Interventions     No data to display

## 2022-05-19 NOTE — Progress Notes (Signed)
Subjective: 1 Day Post-Op Procedure(s) (LRB): TOTAL HIP ARTHROPLASTY ANTERIOR APPROACH (Right) Patient reports pain as mild.   Patient seen in rounds with Dr. Alvan Dame. Patient is resting in bed on exam this morning. No acute events overnight. Foley catheter removed. Her personal RN at the bedside.  We will start therapy today.   Objective: Vital signs in last 24 hours: Temp:  [97.3 F (36.3 C)-98.2 F (36.8 C)] 98.2 F (36.8 C) (08/16 0518) Pulse Rate:  [56-94] 61 (08/16 0518) Resp:  [11-18] 17 (08/16 0518) BP: (96-190)/(43-77) 126/43 (08/16 0518) SpO2:  [96 %-100 %] 99 % (08/16 0518) Weight:  [42.6 kg] 42.6 kg (08/15 1052)  Intake/Output from previous day:  Intake/Output Summary (Last 24 hours) at 05/19/2022 0744 Last data filed at 05/19/2022 0600 Gross per 24 hour  Intake 3638.91 ml  Output 3050 ml  Net 588.91 ml     Intake/Output this shift: No intake/output data recorded.  Labs: Recent Labs    05/19/22 0432  HGB 9.7*   Recent Labs    05/19/22 0432  WBC 7.5  RBC 2.95*  HCT 30.0*  PLT 179   Recent Labs    05/19/22 0432  NA 136  K 3.8  CL 108  CO2 24  BUN 17  CREATININE 0.68  GLUCOSE 96  CALCIUM 7.9*   No results for input(s): "LABPT", "INR" in the last 72 hours.  Exam: General - Patient is Alert and Oriented Extremity - Neurologically intact Sensation intact distally Intact pulses distally Dorsiflexion/Plantar flexion intact Dressing - dressing C/D/I Motor Function - intact, moving foot and toes well on exam.   Past Medical History:  Diagnosis Date   Anxiety    Arthritis    "hips, spine" (03/17/2018)   Bipolar II disorder (HCC)    CHF (congestive heart failure) (HCC)    Chronic bronchitis (HCC)    Chronic lower back pain    Chronic right hip pain    CKD (chronic kidney disease), stage II    Coronary artery disease    stent x1   Esophagitis, erosive    GAD (generalized anxiety disorder)    GERD (gastroesophageal reflux disease)     Headache    "maybe monthly" (03/17/2018))   Heart murmur, systolic    History of adenomatous polyp of colon    08-04-2016  tubular adenoma   History of blood transfusion 12/2017   "related to vascular hematoma"   History of electroconvulsive therapy    at [place]--  started 04-15-2015 to 11-17-2016  total greater than 40 times   History of hiatal hernia    Hyperlipidemia    Hypertension    Hypothyroidism    Internal carotid artery stenosis, bilateral    per last duplex 05-01-2014  bilateral ICA 40-59%   Major depression, chronic    ECT treatments extensive and multiple started 07/ 2016   Memory loss    "both short and long-term; needs frequent reminders to follow instrucitons" (05/16/2017)   Migraines    "none in years" (03/17/2018)   OSA (obstructive sleep apnea)    per study 06/ 2012 moderate OSA  ; "refuses to wear masks" (03/17/2018)   Osteoporosis    Poor historian    due to short term memory loss   Presence of permanent cardiac pacemaker 03/17/2018   Pulmonary nodule    monitored by pcp   S/P placement of cardiac pacemaker 03/17/18 [facility]  03/18/2018   Short-term memory loss    Sick sinus syndrome ([place])  Assessment/Plan: 1 Day Post-Op Procedure(s) (LRB): TOTAL HIP ARTHROPLASTY ANTERIOR APPROACH (Right) Principal Problem:   S/P total right hip arthroplasty  Estimated body mass index is 19.65 kg/m as calculated from the following:   Height as of this encounter: '4\' 10"'$  (1.473 m).   Weight as of this encounter: 42.6 kg. Advance diet Up with therapy D/C IV fluids  DVT Prophylaxis - Aspirin Weight bearing as tolerated.  Hgb stable at 9.7 this AM.  Plan is to go Home after hospital stay. Plan for discharge today after meeting goals with therapy. Follow up in the office in 2 weeks.   Griffith Citron, PA-C Orthopedic Surgery [phone] 05/19/2022, 7:44 AM

## 2022-05-19 NOTE — Progress Notes (Signed)
Provided discharge education/instructions to Pt's RN, [name], all questions and concerns addressed. Pt not in acute distress, discharged home with belongings accompanied by family.

## 2022-05-20 ENCOUNTER — Other Ambulatory Visit (HOSPITAL_BASED_OUTPATIENT_CLINIC_OR_DEPARTMENT_OTHER): Payer: Self-pay

## 2022-05-20 MED ORDER — ONDANSETRON 4 MG PO TBDP
ORAL_TABLET | ORAL | 0 refills | Status: AC
  Filled 2022-05-20: qty 20, 5d supply, fill #0

## 2022-05-24 ENCOUNTER — Encounter: Payer: Self-pay | Admitting: Gastroenterology

## 2022-05-24 DIAGNOSIS — K5904 Chronic idiopathic constipation: Secondary | ICD-10-CM

## 2022-05-24 DIAGNOSIS — T40601A Poisoning by unspecified narcotics, accidental (unintentional), initial encounter: Secondary | ICD-10-CM

## 2022-05-24 DIAGNOSIS — K5901 Slow transit constipation: Secondary | ICD-10-CM

## 2022-05-24 MED ORDER — NALOXEGOL OXALATE 12.5 MG PO TABS
25.0000 mg | ORAL_TABLET | Freq: Every day | ORAL | 3 refills | Status: DC
  Filled 2022-05-24 – 2022-07-18 (×3): qty 60, 30d supply, fill #0

## 2022-05-25 ENCOUNTER — Other Ambulatory Visit (HOSPITAL_BASED_OUTPATIENT_CLINIC_OR_DEPARTMENT_OTHER): Payer: Self-pay

## 2022-05-25 ENCOUNTER — Encounter (HOSPITAL_BASED_OUTPATIENT_CLINIC_OR_DEPARTMENT_OTHER): Payer: Self-pay | Admitting: Pharmacist

## 2022-05-26 ENCOUNTER — Other Ambulatory Visit (HOSPITAL_BASED_OUTPATIENT_CLINIC_OR_DEPARTMENT_OTHER): Payer: Self-pay

## 2022-05-27 NOTE — Discharge Summary (Signed)
Patient ID: Meghan Oliver MRN: [id number] DOB/AGE: 1941/08/21 81 y.o.  Admit date: 05/18/2022 Discharge date: 05/19/2022  Admission Diagnoses:  Right hip osteoarthritis  Discharge Diagnoses:  Principal Problem:   S/P total right hip arthroplasty   Past Medical History:  Diagnosis Date   Anxiety    Arthritis    "hips, spine" (03/17/2018)   Bipolar II disorder (HCC)    CHF (congestive heart failure) (HCC)    Chronic bronchitis (HCC)    Chronic lower back pain    Chronic right hip pain    CKD (chronic kidney disease), stage II    Coronary artery disease    stent x1   Esophagitis, erosive    GAD (generalized anxiety disorder)    GERD (gastroesophageal reflux disease)    Headache    "maybe monthly" (03/17/2018))   Heart murmur, systolic    History of adenomatous polyp of colon    08-04-2016  tubular adenoma   History of blood transfusion 12/2017   "related to vascular hematoma"   History of electroconvulsive therapy    at [facility]--  started 04-15-2015 to 11-17-2016  total greater than 40 times   History of hiatal hernia    Hyperlipidemia    Hypertension    Hypothyroidism    Internal carotid artery stenosis, bilateral    per last duplex 05-01-2014  bilateral ICA 40-59%   Major depression, chronic    ECT treatments extensive and multiple started 07/ 2016   Memory loss    "both short and long-term; needs frequent reminders to follow instrucitons" (05/16/2017)   Migraines    "none in years" (03/17/2018)   OSA (obstructive sleep apnea)    per study 06/ 2012 moderate OSA  ; "refuses to wear masks" (03/17/2018)   Osteoporosis    Poor historian    due to short term memory loss   Presence of permanent cardiac pacemaker 03/17/2018   Pulmonary nodule    monitored by pcp   S/P placement of cardiac pacemaker 03/17/18 [facility]  03/18/2018   Short-term memory loss    Sick sinus syndrome ([place])     Surgeries: Procedure(s): TOTAL HIP ARTHROPLASTY ANTERIOR APPROACH on 05/18/2022    Consultants:   Discharged Condition: Improved  Hospital Course: Meghan Oliver is an 81 y.o. female who was admitted 05/18/2022 for operative treatment ofS/P total right hip arthroplasty. Patient has severe unremitting pain that affects sleep, daily activities, and work/hobbies. After pre-op clearance the patient was taken to the operating room on 05/18/2022 and underwent  Procedure(s): TOTAL HIP ARTHROPLASTY ANTERIOR APPROACH.    Patient was given perioperative antibiotics:  Anti-infectives (From admission, onward)    Start     Dose/Rate Route Frequency Ordered Stop   05/18/22 1800  ceFAZolin (ANCEF) IVPB 2g/100 mL premix        2 g 200 mL/hr over 30 Minutes Intravenous Every 6 hours 05/18/22 1534 05/19/22 1326   05/18/22 1534  valACYclovir (VALTREX) tablet 2,000 mg  Status:  Discontinued        2,000 mg Oral As directed 05/18/22 1534 05/18/22 1547   05/18/22 1100  ceFAZolin (ANCEF) IVPB 2g/100 mL premix        2 g 200 mL/hr over 30 Minutes Intravenous On call to O.R. 05/18/22 1045 05/18/22 1214        Patient was given sequential compression devices, early ambulation, and chemoprophylaxis to prevent DVT. Patient worked with PT and was meeting their goals regarding safe ambulation and transfers.  Patient benefited maximally from  hospital stay and there were no complications.    Recent vital signs: No data found.   Recent laboratory studies: No results for input(s): "WBC", "HGB", "HCT", "PLT", "NA", "K", "CL", "CO2", "BUN", "CREATININE", "GLUCOSE", "INR", "CALCIUM" in the last 72 hours.  Invalid input(s): "PT", "2"   Discharge Medications:   Allergies as of 05/19/2022       Reactions   Propranolol Other (See Comments)   Low blood pressure   Brexpiprazole Other (See Comments)   Aphasia and catatonia        Medication List     TAKE these medications    acetaminophen 500 MG tablet Commonly known as: TYLENOL Take 2 tablets (1,000 mg total) by mouth every 6 (six)  hours. Notes to patient: Last dose given 08/16 11:25am   Aspirin Low Dose 81 MG chewable tablet Generic drug: aspirin Chew 1 tablet (81 mg total) by mouth 2 (two) times daily for 28 days. Then resume normal dose of aspirin 81 mg daily. What changed:  medication strength when to take this additional instructions   CALCIUM 600/VITAMIN D PO Take 1 tablet by mouth 2 (two) times daily. Notes to patient: Resume home regimen   cetirizine 10 MG tablet Commonly known as: ZYRTEC Take 10 mg by mouth daily as needed for allergies. Notes to patient: Resume home regimen   cyanocobalamin 1000 MCG tablet Commonly known as: VITAMIN B12 Take 1,000 mcg by mouth at bedtime. Notes to patient: Resume home regimen   dexlansoprazole 60 MG capsule Commonly known as: DEXILANT Take 1 capsule by mouth daily What changed:  how much to take how to take this when to take this Notes to patient: Resume home regimen   fesoterodine 8 MG Tb24 tablet Commonly known as: TOVIAZ Take 1 tablet (8 mg total) by mouth daily.   Krill Oil 500 MG Caps Take 500 mg by mouth daily. Notes to patient: Resume home regimen   Lavender Oil 80 MG Caps Take 160 mg by mouth at bedtime. Notes to patient: Resume home regimen   levothyroxine 75 MCG tablet Commonly known as: SYNTHROID TAKE 1 TABLET(75 MCG) BY MOUTH DAILY   Linzess 72 MCG capsule Generic drug: linaclotide Take 1 capsule (72 mcg total) by mouth in the morning and at bedtime.   lithium carbonate 300 MG capsule TAKE 1 CAPSULE(300 MG) BY MOUTH AT BEDTIME   LORazepam 0.5 MG tablet Commonly known as: ATIVAN TAKE 1/2 TO 1 TABLET BY MOUTH EVERY NIGHT AT BEDTIME AND 1 TABLET EVERY 8 HOURS AS NEEDED FOR ANXIETY What changed:  how much to take how to take this when to take this Notes to patient: Last dose given 08/15 09:13pm   memantine 10 MG tablet Commonly known as: NAMENDA Take 1 tablet (10 mg total) by mouth 2 (two) times daily.   methocarbamol 500  MG tablet Commonly known as: ROBAXIN Take 1 tablet (500 mg total) by mouth every 6 (six) hours as needed for muscle spasms. Notes to patient: Last dose given 08/16 11:25am   mirtazapine 7.5 MG tablet Commonly known as: REMERON Take 1 tablet (7.5 mg total) by mouth at bedtime.   PRESERVISION AREDS 2 PO Take 1 capsule by mouth in the morning and at bedtime. Notes to patient: Resume home regimen   multivitamin with minerals tablet Take 1 tablet by mouth daily. Notes to patient: Resume home regimen   nortriptyline 75 MG capsule Commonly known as: PAMELOR Take 1 capsule (75 mg total) by mouth at bedtime.   PARoxetine  20 MG tablet Commonly known as: PAXIL TAKE 1 TABLET(20 MG) BY MOUTH AT BEDTIME   polyethylene glycol 17 g packet Commonly known as: MIRALAX / GLYCOLAX Take 17 g by mouth daily as needed for mild constipation. Notes to patient: Last dose given 09:16pm   primidone 50 MG tablet Commonly known as: MYSOLINE TAKE 2 TABLETS BY MOUTH EVERY MORNING AND 1 TABLET EVERY EVENING   rosuvastatin 40 MG tablet Commonly known as: CRESTOR TAKE 1 TABLET (40 MG) BY MOUTH DAILY What changed:  how much to take how to take this when to take this Notes to patient: Resume home regimen   sennosides-docusate sodium 8.6-50 MG tablet Commonly known as: SENOKOT-S Take 2 tablets by mouth at bedtime.   traMADol 50 MG tablet Commonly known as: ULTRAM Take 1-2 tablets (50-100 mg total) by mouth every 6 (six) hours as needed for moderate pain or severe pain. What changed:  how much to take how to take this when to take this reasons to take this Notes to patient: Last dose given 08/16 02:13pm   valACYclovir 1000 MG tablet Commonly known as: VALTREX Take 2 tablets by mouth at onset, then 2 tablets 12 hours later What changed:  how much to take how to take this when to take this Notes to patient: Resume home regimen   vitamin B-6 500 MG tablet Take 500 mg by mouth 2 (two) times  daily.   Vitamin D3 50 MCG (2000 UT) Tabs Take 2,000 Units by mouth in the morning. Notes to patient: Resume home regimen               Discharge Care Instructions  (From admission, onward)           Start     Ordered   05/19/22 0000  Change dressing       Comments: Maintain surgical dressing until follow up in the clinic. If the edges start to pull up, may reinforce with tape. If the dressing is no longer working, may remove and cover with gauze and tape, but must keep the area dry and clean.  Call with any questions or concerns.   05/19/22 0751            Diagnostic Studies: DG Pelvis Portable  Result Date: 05/18/2022 CLINICAL DATA:  Status post right total hip arthroplasty. EXAM: PORTABLE PELVIS 1 VIEWS COMPARISON:  None Available. FINDINGS: Interval postsurgical changes from right total hip arthroplasty. Arthroplasty components appear in their expected alignment. No periprosthetic fracture is identified. Expected postoperative changes within the overlying soft tissues. IMPRESSION: Postsurgical changes from right total hip arthroplasty. Electronically Signed   By: Yetta Glassman M.D.   On: 05/18/2022 17:11   DG C-Arm 1-60 Min-No Report  Result Date: 05/18/2022 CLINICAL DATA:  Right hip arthroplasty EXAM: DG HIP (WITH OR WITHOUT PELVIS) 1V RIGHT; DG C-ARM 1-60 MIN-NO REPORT COMPARISON:  None Available. FLUOROSCOPY: Air kerma 0.58 mGy FINDINGS: Intraoperative fluoroscopic image of the right hip demonstrate right hip total arthroplasty. No obvious perihardware fracture or component malpositioning. IMPRESSION: Intraoperative fluoroscopic image of the right hip demonstrate right hip total arthroplasty. No obvious perihardware fracture or component malpositioning. Electronically Signed   By: Delanna Ahmadi M.D.   On: 05/18/2022 13:43   DG HIP UNILAT WITH PELVIS 1V RIGHT  Result Date: 05/18/2022 CLINICAL DATA:  Right hip arthroplasty EXAM: DG HIP (WITH OR WITHOUT PELVIS) 1V  RIGHT; DG C-ARM 1-60 MIN-NO REPORT COMPARISON:  None Available. FLUOROSCOPY: Air kerma 0.58 mGy FINDINGS: Intraoperative fluoroscopic  image of the right hip demonstrate right hip total arthroplasty. No obvious perihardware fracture or component malpositioning. IMPRESSION: Intraoperative fluoroscopic image of the right hip demonstrate right hip total arthroplasty. No obvious perihardware fracture or component malpositioning. Electronically Signed   By: Delanna Ahmadi M.D.   On: 05/18/2022 13:43    Disposition: Discharge disposition: 01-Home or Self Care       Discharge Instructions     Call MD / Call 911   Complete by: As directed    If you experience chest pain or shortness of breath, CALL 911 and be transported to the hospital emergency room.  If you develope a fever above 101 F, pus (white drainage) or increased drainage or redness at the wound, or calf pain, call your surgeon's office.   Change dressing   Complete by: As directed    Maintain surgical dressing until follow up in the clinic. If the edges start to pull up, may reinforce with tape. If the dressing is no longer working, may remove and cover with gauze and tape, but must keep the area dry and clean.  Call with any questions or concerns.   Constipation Prevention   Complete by: As directed    Drink plenty of fluids.  Prune juice may be helpful.  You may use a stool softener, such as Colace (over the counter) 100 mg twice a day.  Use MiraLax (over the counter) for constipation as needed.   Diet - low sodium heart healthy   Complete by: As directed    Increase activity slowly as tolerated   Complete by: As directed    Weight bearing as tolerated with assist device (walker, cane, etc) as directed, use it as long as suggested by your surgeon or therapist, typically at least 4-6 weeks.   Post-operative opioid taper instructions:   Complete by: As directed    POST-OPERATIVE OPIOID TAPER INSTRUCTIONS: It is important to wean off of  your opioid medication as soon as possible. If you do not need pain medication after your surgery it is ok to stop day one. Opioids include: Codeine, Hydrocodone(Norco, Vicodin), Oxycodone(Percocet, oxycontin) and hydromorphone amongst others.  Long term and even short term use of opiods can cause: Increased pain response Dependence Constipation Depression Respiratory depression And more.  Withdrawal symptoms can include Flu like symptoms Nausea, vomiting And more Techniques to manage these symptoms Hydrate well Eat regular healthy meals Stay active Use relaxation techniques(deep breathing, meditating, yoga) Do Not substitute Alcohol to help with tapering If you have been on opioids for less than two weeks and do not have pain than it is ok to stop all together.  Plan to wean off of opioids This plan should start within one week post op of your joint replacement. Maintain the same interval or time between taking each dose and first decrease the dose.  Cut the total daily intake of opioids by one tablet each day Next start to increase the time between doses. The last dose that should be eliminated is the evening dose.      TED hose   Complete by: As directed    Use stockings (TED hose) for 2 weeks on both leg(s).  You may remove them at night for sleeping.        Follow-up Information     Paralee Cancel, MD. Schedule an appointment as soon as possible for a visit in 2 week(s).   Specialty: Orthopedic Surgery Contact information: [address] [place] [address] [phone]  Signed: Irving Copas 05/27/2022, 10:54 AM

## 2022-06-02 ENCOUNTER — Other Ambulatory Visit (HOSPITAL_BASED_OUTPATIENT_CLINIC_OR_DEPARTMENT_OTHER): Payer: Self-pay

## 2022-06-03 ENCOUNTER — Telehealth: Payer: Self-pay | Admitting: Gastroenterology

## 2022-06-03 NOTE — Telephone Encounter (Signed)
[name] card, nurse of the pateint called, requesting a call back on [phone]. Please call to advise

## 2022-06-03 NOTE — Telephone Encounter (Signed)
Spoke with [name]. Reports the patient is beginning to have loose bowel movement. Abdomen is less distended now. Patient still does not have good appetite but does not feel it is a concern at this time. She is taking 2 to 4 doses of Miralalx daily, Peri-colace , Movantik and Linzess.  [name] asks if the patient needs an office visit to discuss medication and constipation. Will she be on Movantik and Linzess going forward?

## 2022-06-09 ENCOUNTER — Other Ambulatory Visit (HOSPITAL_BASED_OUTPATIENT_CLINIC_OR_DEPARTMENT_OTHER): Payer: Self-pay

## 2022-06-10 ENCOUNTER — Other Ambulatory Visit (HOSPITAL_BASED_OUTPATIENT_CLINIC_OR_DEPARTMENT_OTHER): Payer: Self-pay

## 2022-06-11 ENCOUNTER — Other Ambulatory Visit (HOSPITAL_BASED_OUTPATIENT_CLINIC_OR_DEPARTMENT_OTHER): Payer: Self-pay

## 2022-06-14 ENCOUNTER — Other Ambulatory Visit (HOSPITAL_BASED_OUTPATIENT_CLINIC_OR_DEPARTMENT_OTHER): Payer: Self-pay

## 2022-06-16 ENCOUNTER — Other Ambulatory Visit (HOSPITAL_BASED_OUTPATIENT_CLINIC_OR_DEPARTMENT_OTHER): Payer: Self-pay

## 2022-06-16 ENCOUNTER — Other Ambulatory Visit: Payer: Self-pay | Admitting: Gastroenterology

## 2022-06-16 MED ORDER — LINACLOTIDE 72 MCG PO CAPS
72.0000 ug | ORAL_CAPSULE | Freq: Two times a day (BID) | ORAL | 1 refills | Status: DC
  Filled 2022-06-17: qty 180, 90d supply, fill #0

## 2022-06-16 MED ORDER — PRIMIDONE 50 MG PO TABS
ORAL_TABLET | ORAL | 0 refills | Status: DC
  Filled 2022-06-16: qty 270, 90d supply, fill #0

## 2022-06-16 MED FILL — Lithium Carbonate Cap 300 MG: ORAL | 90 days supply | Qty: 90 | Fill #0 | Status: CN

## 2022-06-16 MED FILL — Levothyroxine Sodium Tab 75 MCG: ORAL | 90 days supply | Qty: 90 | Fill #0 | Status: CN

## 2022-06-16 MED FILL — Paroxetine HCl Tab 20 MG: ORAL | 90 days supply | Qty: 90 | Fill #0 | Status: CN

## 2022-06-16 NOTE — Telephone Encounter (Signed)
She will stay on both Movantik and Linzess, will follow up in office visit as scheduled in Oct. Thanks

## 2022-06-16 NOTE — Telephone Encounter (Signed)
[name] advised. Appointment for follow up 08/10/22 at 1:30 pm. Call sooner if there are questions or concerns.

## 2022-06-17 ENCOUNTER — Other Ambulatory Visit (HOSPITAL_BASED_OUTPATIENT_CLINIC_OR_DEPARTMENT_OTHER): Payer: Self-pay

## 2022-06-18 ENCOUNTER — Other Ambulatory Visit (HOSPITAL_BASED_OUTPATIENT_CLINIC_OR_DEPARTMENT_OTHER): Payer: Self-pay

## 2022-06-30 ENCOUNTER — Other Ambulatory Visit (HOSPITAL_BASED_OUTPATIENT_CLINIC_OR_DEPARTMENT_OTHER): Payer: Self-pay

## 2022-06-30 MED FILL — Levothyroxine Sodium Tab 75 MCG: ORAL | 90 days supply | Qty: 90 | Fill #0 | Status: CN

## 2022-06-30 MED FILL — Rosuvastatin Calcium Tab 40 MG: ORAL | 90 days supply | Qty: 90 | Fill #1 | Status: AC

## 2022-06-30 MED FILL — Paroxetine HCl Tab 20 MG: ORAL | 90 days supply | Qty: 90 | Fill #0 | Status: CN

## 2022-06-30 MED FILL — Lithium Carbonate Cap 300 MG: ORAL | 90 days supply | Qty: 90 | Fill #0 | Status: CN

## 2022-07-01 ENCOUNTER — Emergency Department (HOSPITAL_BASED_OUTPATIENT_CLINIC_OR_DEPARTMENT_OTHER): Payer: Medicare Other | Admitting: Radiology

## 2022-07-01 ENCOUNTER — Other Ambulatory Visit (HOSPITAL_BASED_OUTPATIENT_CLINIC_OR_DEPARTMENT_OTHER): Payer: Self-pay

## 2022-07-01 ENCOUNTER — Emergency Department (HOSPITAL_BASED_OUTPATIENT_CLINIC_OR_DEPARTMENT_OTHER): Payer: Medicare Other

## 2022-07-01 ENCOUNTER — Inpatient Hospital Stay (HOSPITAL_BASED_OUTPATIENT_CLINIC_OR_DEPARTMENT_OTHER)
Admission: EM | Admit: 2022-07-01 | Discharge: 2022-07-08 | DRG: 167 | Disposition: A | Discharge status: Home Health | Payer: Medicare Other | Attending: Internal Medicine | Admitting: Internal Medicine

## 2022-07-01 ENCOUNTER — Other Ambulatory Visit: Payer: Self-pay

## 2022-07-01 ENCOUNTER — Encounter (HOSPITAL_BASED_OUTPATIENT_CLINIC_OR_DEPARTMENT_OTHER): Payer: Self-pay | Admitting: Emergency Medicine

## 2022-07-01 DIAGNOSIS — R7982 Elevated C-reactive protein (CRP): Secondary | ICD-10-CM | POA: Diagnosis not present

## 2022-07-01 DIAGNOSIS — F411 Generalized anxiety disorder: Secondary | ICD-10-CM | POA: Diagnosis present

## 2022-07-01 DIAGNOSIS — I495 Sick sinus syndrome: Secondary | ICD-10-CM | POA: Diagnosis present

## 2022-07-01 DIAGNOSIS — I130 Hypertensive heart and chronic kidney disease with heart failure and stage 1 through stage 4 chronic kidney disease, or unspecified chronic kidney disease: Secondary | ICD-10-CM | POA: Diagnosis not present

## 2022-07-01 DIAGNOSIS — E039 Hypothyroidism, unspecified: Secondary | ICD-10-CM | POA: Diagnosis present

## 2022-07-01 DIAGNOSIS — E785 Hyperlipidemia, unspecified: Secondary | ICD-10-CM | POA: Diagnosis present

## 2022-07-01 DIAGNOSIS — Z87891 Personal history of nicotine dependence: Secondary | ICD-10-CM

## 2022-07-01 DIAGNOSIS — G43909 Migraine, unspecified, not intractable, without status migrainosus: Secondary | ICD-10-CM | POA: Diagnosis present

## 2022-07-01 DIAGNOSIS — G4733 Obstructive sleep apnea (adult) (pediatric): Secondary | ICD-10-CM | POA: Diagnosis present

## 2022-07-01 DIAGNOSIS — J90 Pleural effusion, not elsewhere classified: Secondary | ICD-10-CM | POA: Diagnosis present

## 2022-07-01 DIAGNOSIS — Z888 Allergy status to other drugs, medicaments and biological substances status: Secondary | ICD-10-CM

## 2022-07-01 DIAGNOSIS — R5381 Other malaise: Secondary | ICD-10-CM | POA: Diagnosis not present

## 2022-07-01 DIAGNOSIS — F0393 Unspecified dementia, unspecified severity, with mood disturbance: Secondary | ICD-10-CM | POA: Diagnosis present

## 2022-07-01 DIAGNOSIS — I272 Pulmonary hypertension, unspecified: Secondary | ICD-10-CM | POA: Diagnosis present

## 2022-07-01 DIAGNOSIS — N182 Chronic kidney disease, stage 2 (mild): Secondary | ICD-10-CM | POA: Diagnosis present

## 2022-07-01 DIAGNOSIS — I10 Essential (primary) hypertension: Secondary | ICD-10-CM | POA: Diagnosis present

## 2022-07-01 DIAGNOSIS — E222 Syndrome of inappropriate secretion of antidiuretic hormone: Secondary | ICD-10-CM | POA: Diagnosis not present

## 2022-07-01 DIAGNOSIS — I2510 Atherosclerotic heart disease of native coronary artery without angina pectoris: Secondary | ICD-10-CM | POA: Diagnosis not present

## 2022-07-01 DIAGNOSIS — K219 Gastro-esophageal reflux disease without esophagitis: Secondary | ICD-10-CM | POA: Diagnosis present

## 2022-07-01 DIAGNOSIS — D539 Nutritional anemia, unspecified: Secondary | ICD-10-CM | POA: Diagnosis not present

## 2022-07-01 DIAGNOSIS — I5022 Chronic systolic (congestive) heart failure: Secondary | ICD-10-CM | POA: Diagnosis not present

## 2022-07-01 DIAGNOSIS — R748 Abnormal levels of other serum enzymes: Secondary | ICD-10-CM | POA: Diagnosis not present

## 2022-07-01 DIAGNOSIS — F3181 Bipolar II disorder: Secondary | ICD-10-CM | POA: Diagnosis present

## 2022-07-01 DIAGNOSIS — I509 Heart failure, unspecified: Secondary | ICD-10-CM | POA: Diagnosis not present

## 2022-07-01 DIAGNOSIS — Z1152 Encounter for screening for COVID-19: Secondary | ICD-10-CM | POA: Diagnosis not present

## 2022-07-01 DIAGNOSIS — R413 Other amnesia: Secondary | ICD-10-CM

## 2022-07-01 DIAGNOSIS — I700 Atherosclerosis of aorta: Secondary | ICD-10-CM | POA: Diagnosis not present

## 2022-07-01 DIAGNOSIS — K581 Irritable bowel syndrome with constipation: Secondary | ICD-10-CM | POA: Diagnosis present

## 2022-07-01 DIAGNOSIS — F05 Delirium due to known physiological condition: Secondary | ICD-10-CM | POA: Diagnosis not present

## 2022-07-01 DIAGNOSIS — K5909 Other constipation: Secondary | ICD-10-CM | POA: Diagnosis not present

## 2022-07-01 DIAGNOSIS — I5032 Chronic diastolic (congestive) heart failure: Secondary | ICD-10-CM | POA: Diagnosis present

## 2022-07-01 DIAGNOSIS — R519 Headache, unspecified: Secondary | ICD-10-CM | POA: Diagnosis not present

## 2022-07-01 DIAGNOSIS — R700 Elevated erythrocyte sedimentation rate: Secondary | ICD-10-CM | POA: Diagnosis present

## 2022-07-01 DIAGNOSIS — Z803 Family history of malignant neoplasm of breast: Secondary | ICD-10-CM

## 2022-07-01 DIAGNOSIS — Z86010 Personal history of colonic polyps: Secondary | ICD-10-CM

## 2022-07-01 DIAGNOSIS — Z950 Presence of cardiac pacemaker: Secondary | ICD-10-CM

## 2022-07-01 DIAGNOSIS — Z955 Presence of coronary angioplasty implant and graft: Secondary | ICD-10-CM

## 2022-07-01 DIAGNOSIS — R7989 Other specified abnormal findings of blood chemistry: Secondary | ICD-10-CM | POA: Diagnosis not present

## 2022-07-01 DIAGNOSIS — G8929 Other chronic pain: Secondary | ICD-10-CM | POA: Diagnosis present

## 2022-07-01 DIAGNOSIS — R918 Other nonspecific abnormal finding of lung field: Secondary | ICD-10-CM | POA: Diagnosis not present

## 2022-07-01 DIAGNOSIS — I110 Hypertensive heart disease with heart failure: Secondary | ICD-10-CM | POA: Diagnosis not present

## 2022-07-01 DIAGNOSIS — J180 Bronchopneumonia, unspecified organism: Secondary | ICD-10-CM | POA: Diagnosis not present

## 2022-07-01 DIAGNOSIS — J189 Pneumonia, unspecified organism: Secondary | ICD-10-CM

## 2022-07-01 DIAGNOSIS — I6523 Occlusion and stenosis of bilateral carotid arteries: Secondary | ICD-10-CM | POA: Diagnosis present

## 2022-07-01 DIAGNOSIS — F0394 Unspecified dementia, unspecified severity, with anxiety: Secondary | ICD-10-CM | POA: Diagnosis present

## 2022-07-01 DIAGNOSIS — J42 Unspecified chronic bronchitis: Secondary | ICD-10-CM | POA: Diagnosis present

## 2022-07-01 DIAGNOSIS — Z9889 Other specified postprocedural states: Secondary | ICD-10-CM | POA: Diagnosis not present

## 2022-07-01 DIAGNOSIS — K449 Diaphragmatic hernia without obstruction or gangrene: Secondary | ICD-10-CM | POA: Diagnosis not present

## 2022-07-01 DIAGNOSIS — Z825 Family history of asthma and other chronic lower respiratory diseases: Secondary | ICD-10-CM

## 2022-07-01 DIAGNOSIS — Z96643 Presence of artificial hip joint, bilateral: Secondary | ICD-10-CM | POA: Diagnosis present

## 2022-07-01 DIAGNOSIS — Z79890 Hormone replacement therapy (postmenopausal): Secondary | ICD-10-CM

## 2022-07-01 DIAGNOSIS — J948 Other specified pleural conditions: Secondary | ICD-10-CM | POA: Diagnosis not present

## 2022-07-01 DIAGNOSIS — M810 Age-related osteoporosis without current pathological fracture: Secondary | ICD-10-CM | POA: Diagnosis present

## 2022-07-01 DIAGNOSIS — Z8249 Family history of ischemic heart disease and other diseases of the circulatory system: Secondary | ICD-10-CM

## 2022-07-01 DIAGNOSIS — R778 Other specified abnormalities of plasma proteins: Secondary | ICD-10-CM

## 2022-07-01 DIAGNOSIS — Z79899 Other long term (current) drug therapy: Secondary | ICD-10-CM

## 2022-07-01 DIAGNOSIS — M316 Other giant cell arteritis: Secondary | ICD-10-CM | POA: Diagnosis not present

## 2022-07-01 DIAGNOSIS — G4489 Other headache syndrome: Secondary | ICD-10-CM | POA: Diagnosis not present

## 2022-07-01 DIAGNOSIS — F32A Depression, unspecified: Secondary | ICD-10-CM

## 2022-07-01 DIAGNOSIS — Z91199 Patient's noncompliance with other medical treatment and regimen due to unspecified reason: Secondary | ICD-10-CM

## 2022-07-01 DIAGNOSIS — R059 Cough, unspecified: Secondary | ICD-10-CM | POA: Diagnosis not present

## 2022-07-01 LAB — RESPIRATORY PANEL BY PCR

## 2022-07-01 LAB — IRON AND TIBC
Iron: 13 ug/dL — ABNORMAL LOW (ref 28–170)
Saturation Ratios: 4 % — ABNORMAL LOW (ref 10.4–31.8)
TIBC: 293 ug/dL (ref 250–450)
UIBC: 280 ug/dL

## 2022-07-01 LAB — CBC WITH DIFFERENTIAL/PLATELET
Abs Immature Granulocytes: 0.22 10*3/uL — ABNORMAL HIGH (ref 0.00–0.07)
Basophils Absolute: 0 10*3/uL (ref 0.0–0.1)
Basophils Relative: 0 %
Eosinophils Absolute: 0.1 10*3/uL (ref 0.0–0.5)
Eosinophils Relative: 1 %
HCT: 32.3 % — ABNORMAL LOW (ref 36.0–46.0)
Hemoglobin: 10.1 g/dL — ABNORMAL LOW (ref 12.0–15.0)
Immature Granulocytes: 2 %
Lymphocytes Relative: 7 %
Lymphs Abs: 0.8 10*3/uL (ref 0.7–4.0)
MCH: 32.3 pg (ref 26.0–34.0)
MCHC: 31.3 g/dL (ref 30.0–36.0)
MCV: 103.2 fL — ABNORMAL HIGH (ref 80.0–100.0)
Monocytes Absolute: 0.6 10*3/uL (ref 0.1–1.0)
Monocytes Relative: 6 %
Neutro Abs: 9.1 10*3/uL — ABNORMAL HIGH (ref 1.7–7.7)
Neutrophils Relative %: 84 %
Platelets: 179 10*3/uL (ref 150–400)
RBC: 3.13 MIL/uL — ABNORMAL LOW (ref 3.87–5.11)
RDW: 13.4 % (ref 11.5–15.5)
WBC: 10.8 10*3/uL — ABNORMAL HIGH (ref 4.0–10.5)
nRBC: 0 % (ref 0.0–0.2)

## 2022-07-01 LAB — RESP PANEL BY RT-PCR (FLU A&B, COVID) ARPGX2
Influenza A by PCR: NEGATIVE
Influenza B by PCR: NEGATIVE
SARS Coronavirus 2 by RT PCR: NEGATIVE

## 2022-07-01 LAB — VITAMIN B12: Vitamin B-12: 5371 pg/mL — ABNORMAL HIGH (ref 180–914)

## 2022-07-01 LAB — BASIC METABOLIC PANEL
Anion gap: 9 (ref 5–15)
BUN: 24 mg/dL — ABNORMAL HIGH (ref 8–23)
CO2: 22 mmol/L (ref 22–32)
Calcium: 9 mg/dL (ref 8.9–10.3)
Chloride: 105 mmol/L (ref 98–111)
Creatinine, Ser: 0.74 mg/dL (ref 0.44–1.00)
GFR, Estimated: >60 mL/min (ref >60)
Glucose, Bld: 86 mg/dL (ref 70–99)
Potassium: 4.2 mmol/L (ref 3.5–5.1)
Sodium: 136 mmol/L (ref 135–145)

## 2022-07-01 LAB — BRAIN NATRIURETIC PEPTIDE: B Natriuretic Peptide: 206.2 pg/mL — ABNORMAL HIGH (ref 0.0–100.0)

## 2022-07-01 LAB — TROPONIN I (HIGH SENSITIVITY)
Troponin I (High Sensitivity): 44 ng/L — ABNORMAL HIGH
Troponin I (High Sensitivity): 34 ng/L — ABNORMAL HIGH

## 2022-07-01 LAB — FOLATE: Folate: 11.5 ng/mL (ref >5.9)

## 2022-07-01 LAB — FERRITIN: Ferritin: 130 ng/mL (ref 11–307)

## 2022-07-01 LAB — SEDIMENTATION RATE: Sed Rate: 70 mm/hr — ABNORMAL HIGH (ref 0–22)

## 2022-07-01 LAB — C-REACTIVE PROTEIN: CRP: 18.3 mg/dL — ABNORMAL HIGH

## 2022-07-01 LAB — PROCALCITONIN: Procalcitonin: 2.05 ng/mL

## 2022-07-01 LAB — LITHIUM LEVEL: Lithium Lvl: 0.58 mmol/L — ABNORMAL LOW (ref 0.60–1.20)

## 2022-07-01 LAB — STREP PNEUMONIAE URINARY ANTIGEN: Strep Pneumo Urinary Antigen: NEGATIVE

## 2022-07-01 MED ORDER — MEMANTINE HCL 10 MG PO TABS
10.0000 mg | ORAL_TABLET | Freq: Two times a day (BID) | ORAL | Status: DC
  Administered 2022-07-01 – 2022-07-08 (×14): 10 mg via ORAL
  Filled 2022-07-01 (×14): qty 1

## 2022-07-01 MED ORDER — LORAZEPAM 0.5 MG PO TABS
0.5000 mg | ORAL_TABLET | Freq: Three times a day (TID) | ORAL | Status: DC | PRN
  Administered 2022-07-02: 0.5 mg via ORAL
  Filled 2022-07-01: qty 1

## 2022-07-01 MED ORDER — ACETAMINOPHEN 650 MG RE SUPP: 650.0000 mg | Freq: Four times a day (QID) | RECTAL | Status: DC | PRN

## 2022-07-01 MED ORDER — PANTOPRAZOLE SODIUM 40 MG PO TBEC
40.0000 mg | DELAYED_RELEASE_TABLET | Freq: Two times a day (BID) | ORAL | Status: DC
  Administered 2022-07-02 – 2022-07-08 (×12): 40 mg via ORAL
  Filled 2022-07-01 (×12): qty 1

## 2022-07-01 MED ORDER — NORTRIPTYLINE HCL 25 MG PO CAPS
75.0000 mg | ORAL_CAPSULE | Freq: Every day | ORAL | Status: DC
  Administered 2022-07-01 – 2022-07-07 (×7): 75 mg via ORAL
  Filled 2022-07-01 (×8): qty 3

## 2022-07-01 MED ORDER — LORAZEPAM 0.5 MG PO TABS: 0.2500 mg | ORAL_TABLET | ORAL | Status: DC

## 2022-07-01 MED ORDER — FESOTERODINE FUMARATE ER 8 MG PO TB24
8.0000 mg | ORAL_TABLET | Freq: Every day | ORAL | Status: DC
  Administered 2022-07-02 – 2022-07-08 (×7): 8 mg via ORAL
  Filled 2022-07-01 (×7): qty 1

## 2022-07-01 MED ORDER — LINACLOTIDE 72 MCG PO CAPS
72.0000 ug | ORAL_CAPSULE | Freq: Two times a day (BID) | ORAL | Status: DC
  Administered 2022-07-01 – 2022-07-08 (×14): 72 ug via ORAL
  Filled 2022-07-01 (×15): qty 1

## 2022-07-01 MED ORDER — PRIMIDONE 50 MG PO TABS
50.0000 mg | ORAL_TABLET | Freq: Every day | ORAL | Status: DC
  Administered 2022-07-01 – 2022-07-07 (×7): 50 mg via ORAL
  Filled 2022-07-01 (×8): qty 1

## 2022-07-01 MED ORDER — GUAIFENESIN-DM 100-10 MG/5ML PO SYRP
5.0000 mL | ORAL_SOLUTION | ORAL | Status: DC | PRN
  Administered 2022-07-02 (×2): 5 mL via ORAL
  Filled 2022-07-01 (×2): qty 5

## 2022-07-01 MED ORDER — SODIUM CHLORIDE 0.9 % IV SOLN
1.0000 g | Freq: Once | INTRAVENOUS | Status: AC
  Administered 2022-07-01: 1 g via INTRAVENOUS
  Filled 2022-07-01: qty 10

## 2022-07-01 MED ORDER — NALOXEGOL OXALATE 12.5 MG PO TABS: 12.5000 mg | ORAL_TABLET | Freq: Every day | ORAL | Status: DC | PRN

## 2022-07-01 MED ORDER — KETOROLAC TROMETHAMINE 30 MG/ML IJ SOLN
30.0000 mg | Freq: Once | INTRAMUSCULAR | Status: AC
  Administered 2022-07-01: 30 mg via INTRAVENOUS
  Filled 2022-07-01: qty 1

## 2022-07-01 MED ORDER — SODIUM CHLORIDE 0.9 % IV SOLN
INTRAVENOUS | Status: DC
  Administered 2022-07-01: 75 mL/h via INTRAVENOUS

## 2022-07-01 MED ORDER — ACETAMINOPHEN 325 MG PO TABS
650.0000 mg | ORAL_TABLET | Freq: Four times a day (QID) | ORAL | Status: DC | PRN
  Administered 2022-07-02 – 2022-07-07 (×5): 650 mg via ORAL
  Filled 2022-07-01 (×5): qty 2

## 2022-07-01 MED ORDER — ROSUVASTATIN CALCIUM 20 MG PO TABS
40.0000 mg | ORAL_TABLET | Freq: Every day | ORAL | Status: DC
  Administered 2022-07-01 – 2022-07-03 (×3): 40 mg via ORAL
  Filled 2022-07-01 (×4): qty 2

## 2022-07-01 MED ORDER — SENNOSIDES-DOCUSATE SODIUM 8.6-50 MG PO TABS
2.0000 | ORAL_TABLET | Freq: Two times a day (BID) | ORAL | Status: DC
  Administered 2022-07-01 – 2022-07-08 (×13): 2 via ORAL
  Filled 2022-07-01 (×14): qty 2

## 2022-07-01 MED ORDER — ASPIRIN 81 MG PO CHEW
81.0000 mg | CHEWABLE_TABLET | Freq: Every day | ORAL | Status: DC
  Administered 2022-07-02 – 2022-07-08 (×7): 81 mg via ORAL
  Filled 2022-07-01 (×7): qty 1

## 2022-07-01 MED ORDER — PRIMIDONE 50 MG PO TABS
100.0000 mg | ORAL_TABLET | Freq: Every morning | ORAL | Status: DC
  Administered 2022-07-02 – 2022-07-08 (×6): 100 mg via ORAL
  Filled 2022-07-01 (×7): qty 2

## 2022-07-01 MED ORDER — POLYETHYLENE GLYCOL 3350 17 G PO PACK
17.0000 g | PACK | Freq: Every day | ORAL | Status: DC | PRN
  Administered 2022-07-07: 17 g via ORAL
  Filled 2022-07-01 (×2): qty 1

## 2022-07-01 MED ORDER — ENOXAPARIN SODIUM 40 MG/0.4ML IJ SOSY: 40.0000 mg | PREFILLED_SYRINGE | INTRAMUSCULAR | Status: DC

## 2022-07-01 MED ORDER — TRAMADOL HCL 50 MG PO TABS
50.0000 mg | ORAL_TABLET | Freq: Four times a day (QID) | ORAL | Status: DC | PRN
  Administered 2022-07-01: 100 mg via ORAL
  Filled 2022-07-01: qty 2

## 2022-07-01 MED ORDER — SODIUM CHLORIDE 0.9 % IV SOLN
500.0000 mg | Freq: Once | INTRAVENOUS | Status: AC
  Administered 2022-07-01: 500 mg via INTRAVENOUS
  Filled 2022-07-01: qty 5

## 2022-07-01 MED ORDER — SODIUM CHLORIDE 0.9 % IV SOLN
1.0000 g | Freq: Every day | INTRAVENOUS | Status: AC
  Administered 2022-07-02 – 2022-07-07 (×6): 1 g via INTRAVENOUS
  Filled 2022-07-01 (×7): qty 10

## 2022-07-01 MED ORDER — PAROXETINE HCL 20 MG PO TABS
20.0000 mg | ORAL_TABLET | Freq: Every day | ORAL | Status: DC
  Administered 2022-07-01 – 2022-07-07 (×7): 20 mg via ORAL
  Filled 2022-07-01 (×8): qty 1

## 2022-07-01 MED ORDER — LORAZEPAM 0.5 MG PO TABS: 0.2500 mg | ORAL_TABLET | Freq: Every day | ORAL | Status: DC

## 2022-07-01 MED ORDER — IOHEXOL 350 MG/ML SOLN
100.0000 mL | Freq: Once | INTRAVENOUS | Status: AC | PRN
  Administered 2022-07-01: 72 mL via INTRAVENOUS

## 2022-07-01 MED ORDER — OXYCODONE-ACETAMINOPHEN 5-325 MG PO TABS
1.0000 | ORAL_TABLET | Freq: Once | ORAL | Status: AC
  Administered 2022-07-01: 1 via ORAL
  Filled 2022-07-01: qty 1

## 2022-07-01 MED ORDER — LITHIUM CARBONATE 300 MG PO CAPS
300.0000 mg | ORAL_CAPSULE | Freq: Every day | ORAL | Status: DC
  Administered 2022-07-01 – 2022-07-07 (×7): 300 mg via ORAL
  Filled 2022-07-01 (×8): qty 1

## 2022-07-01 MED ORDER — ENOXAPARIN SODIUM 30 MG/0.3ML IJ SOSY
30.0000 mg | PREFILLED_SYRINGE | INTRAMUSCULAR | Status: DC
  Administered 2022-07-01 – 2022-07-07 (×7): 30 mg via SUBCUTANEOUS
  Filled 2022-07-01 (×7): qty 0.3

## 2022-07-01 MED ORDER — ALBUTEROL SULFATE (2.5 MG/3ML) 0.083% IN NEBU: 2.5000 mg | INHALATION_SOLUTION | Freq: Four times a day (QID) | RESPIRATORY_TRACT | Status: DC | PRN

## 2022-07-01 MED ORDER — SODIUM CHLORIDE 0.9 % IV SOLN
500.0000 mg | Freq: Every day | INTRAVENOUS | Status: DC
  Administered 2022-07-02 – 2022-07-06 (×5): 500 mg via INTRAVENOUS
  Filled 2022-07-01 (×7): qty 5

## 2022-07-01 MED ORDER — LEVOTHYROXINE SODIUM 75 MCG PO TABS
75.0000 ug | ORAL_TABLET | Freq: Every day | ORAL | Status: DC
  Administered 2022-07-02 – 2022-07-08 (×7): 75 ug via ORAL
  Filled 2022-07-01 (×7): qty 1

## 2022-07-01 MED ORDER — ONDANSETRON HCL 4 MG/2ML IJ SOLN: 4.0000 mg | Freq: Three times a day (TID) | INTRAMUSCULAR | Status: DC | PRN

## 2022-07-01 MED ORDER — MIRTAZAPINE 15 MG PO TABS
7.5000 mg | ORAL_TABLET | Freq: Every day | ORAL | Status: DC
  Administered 2022-07-01 – 2022-07-07 (×7): 7.5 mg via ORAL
  Filled 2022-07-01 (×7): qty 1

## 2022-07-01 NOTE — Assessment & Plan Note (Signed)
Continue PPI ?

## 2022-07-01 NOTE — Assessment & Plan Note (Addendum)
History of ECT in past Continue home medication of paxil, pamelor, remeron and ativan PRN and lithium Check lithium level

## 2022-07-01 NOTE — Assessment & Plan Note (Addendum)
TSH wnl in 03/2022 Continue home synthroid

## 2022-07-01 NOTE — ED Triage Notes (Signed)
Pt arrives to ED with c/o headache and cough. She reports the headache is left sided located on the temple and intermittent. She denies CP, fevers, Sob. Associated symptoms include malaise.

## 2022-07-01 NOTE — Assessment & Plan Note (Signed)
She has no chest pain or ekg changes Initial troponin 44, delta pending Check BNP Likely due to possible infection vs. Hypoperfusion as her blood pressures are soft.  Continue to monitor on tele, f/u on delta

## 2022-07-01 NOTE — Assessment & Plan Note (Signed)
Not on cpap, non compliant

## 2022-07-01 NOTE — H&P (Signed)
History and Physical    Patient: ZULEIKA GALLUS [id number]:[id number] DOB: Jan 27, 1941 DOA: 07/01/2022 DOS: the patient was seen and examined on 07/01/2022 PCP: [name], MD  Patient coming from: Home - lives with her husband. Uses a cane.    Chief Complaint: severe headache, cough  HPI: NOVIE MAGGIO is a 81 y.o. female with medical history significant of HTN, HLD, hypothyroidism, OSA, GERD, sinus node dysfunction s/p PPM in 03/2018, bipolar and depression who presented to ED with 3 day history of cough, chills and a severe headache. New headache on left temple x 3 days. Her husband is a physician and heard crackles in her lungs and reports she was warm to touch. She has no shortness of breath and no sick contacts. She has not taken anything over the counter. She had similar headache about 1-2 months ago and it lasted a day. No phonophobia or photophobia. No pain with chewing and no tenderness to touch over left temple. Nothing seems to help. Tylenol has not helped. Tramadol has not helped. Pain 10/10 and stabbing, intermittent. Pain doesn't wake her up from sleep. No N/V.   Denies any fever/chills,, chest pain or palpitations, shortness of breath or abdominal pain, N/V/D, dysuria or leg swelling.   She had a total hip replacement 6 weeks ago.   She does not smoke or drink alcohol.   ER Course:  vitals: afebrile, bp: 131/54, HR: 72, RR: 24, oxygen: 95%RA Pertinent labs: wbc: 10.8, hgb: 10.1, covid negative, ESR: 70,  CXR: right upper lobe pneumonia CT head: no acute finding  CT venogram/CTA head: no LVO or stenosis no dural vein thrombosis  In ED: started on rocephin and azithromycin. Neurology consulted. [facility] asked to admit.    Review of Systems: As mentioned in the history of present illness. All other systems reviewed and are negative. Past Medical History:  Diagnosis Date   Anxiety    Arthritis    "hips, spine" (03/17/2018)   Bipolar II disorder (HCC)    CHF (congestive heart  failure) (HCC)    Chronic bronchitis (HCC)    Chronic lower back pain    Chronic right hip pain    CKD (chronic kidney disease), stage II    Coronary artery disease    stent x1   Esophagitis, erosive    GAD (generalized anxiety disorder)    GERD (gastroesophageal reflux disease)    Headache    "maybe monthly" (03/17/2018))   Heart murmur, systolic    History of adenomatous polyp of colon    08-04-2016  tubular adenoma   History of blood transfusion 12/2017   "related to vascular hematoma"   History of electroconvulsive therapy    at [place]--  started 04-15-2015 to 11-17-2016  total greater than 40 times   History of hiatal hernia    Hyperlipidemia    Hypertension    Hypothyroidism    Internal carotid artery stenosis, bilateral    per last duplex 05-01-2014  bilateral ICA 40-59%   Major depression, chronic    ECT treatments extensive and multiple started 07/ 2016   Memory loss    "both short and long-term; needs frequent reminders to follow instrucitons" (05/16/2017)   Migraines    "none in years" (03/17/2018)   OSA (obstructive sleep apnea)    per study 06/ 2012 moderate OSA  ; "refuses to wear masks" (03/17/2018)   Osteoporosis    Poor historian    due to short term memory loss   Presence of permanent  cardiac pacemaker 03/17/2018   Pulmonary nodule    monitored by pcp   S/P placement of cardiac pacemaker 03/17/18 [facility]  03/18/2018   Short-term memory loss    Sick sinus syndrome [facility])    Past Surgical History:  Procedure Laterality Date   APPENDECTOMY  1971   AUGMENTATION MAMMAPLASTY Bilateral    CARDIOVASCULAR STRESS TEST  01-30-2015  dr Aundra Dubin   Low risk nuclear study w/ no evidence ischemia or infarction/  normal LV funciton and wall motion , 76%   COLONOSCOPY W/ BIOPSIES AND POLYPECTOMY  "multiple"   COLONOSCOPY WITH ESOPHAGOGASTRODUODENOSCOPY (EGD)  last one 08-04-2016   CORONARY ANGIOPLASTY WITH STENT PLACEMENT  05/16/2017   "LAD"   CORONARY STENT INTERVENTION  N/A 05/16/2017   Procedure: CORONARY STENT INTERVENTION;  Surgeon: Burnell Blanks, MD;  Location: [place] [facility];  Service: Cardiovascular;  Laterality: N/A;   ESOPHAGOGASTRODUODENOSCOPY  02-26-04   ESOPHAGOGASTRODUODENOSCOPY  "multiple"   INSERT / REPLACE / REMOVE PACEMAKER  03/17/2018   LEFT HEART CATH AND CORONARY ANGIOGRAPHY N/A 05/16/2017   Procedure: LEFT HEART CATH AND CORONARY ANGIOGRAPHY;  Surgeon: Larey Dresser, MD;  Location: [place] [facility];  Service: Cardiovascular;  Laterality: N/A;   OVARIAN CYST SURGERY  1970s   Laparotomy    PACEMAKER IMPLANT N/A 03/17/2018   Procedure: PACEMAKER IMPLANT;  Surgeon: Evans Lance, MD;  Location: [place] [facility];  Service: Cardiovascular;  Laterality: N/A;   PORT-A-CATH PLACEMENT  05/31/2016; 2018   "@ [facility]; for ECT series"; "@ [facility] also"   PORT-A-CATH REMOVAL N/A 03/11/2017   Procedure: REMOVAL PORT-A-CATH;  Surgeon: Jackolyn Confer, MD;  Location[facility];  Service: General;  Laterality: N/A;   PORTA CATH REMOVAL  03/17/2018   PORTA CATH REMOVAL  03/17/2018   Procedure: PORTA CATH REMOVAL;  Surgeon: Evans Lance, MD;  Location: [place] [facility];  Service: Cardiovascular;;   TOTAL HIP ARTHROPLASTY Right 05/18/2022   Procedure: TOTAL HIP ARTHROPLASTY ANTERIOR APPROACH;  Surgeon: Paralee Cancel, MD;  Location: [facility];  Service: Orthopedics;  Laterality: Right;   TRANSTHORACIC ECHOCARDIOGRAM  09/05/2013  dr Aundra Dubin   mild LVH, ef 09-73%, grade 1 diastolic dysfunction/  very mild AV stenosis with mild AR/  trivial MR and PT/ mild to moderate LAE/ mild TR/ mild pulmonary hypertension with PA peak pressure 29mHg   TUBAL LIGATION     Social History:  reports that she quit smoking about 51 years ago. Her smoking use included cigarettes. She has a 30.00 pack-year smoking history. She has never used smokeless tobacco. She reports current alcohol use. She reports that she does not use drugs.  Allergies  Allergen  Reactions   Propranolol Other (See Comments)    Low blood pressure   Brexpiprazole Other (See Comments)    Aphasia and catatonia    Family History  Problem Relation Age of Onset   Heart attack Father 577      deceased   Hypertension Father    Heart disease Father    Breast cancer Paternal Grandmother        Age unknown   Breast cancer Paternal Aunt        Age 82's  Colon cancer Neg Hx     Prior to Admission medications   Medication Sig Start Date End Date Taking? Authorizing Provider  acetaminophen (TYLENOL) 500 MG tablet Take 2 tablets (1,000 mg total) by mouth every 6 (six) hours. 05/19/22  Yes DIrving Copas  PA-C  aspirin 81 MG chewable tablet Chew 1 tablet (81 mg total) by mouth 2 (two) times daily for 28 days. Then resume normal dose of aspirin 81 mg daily. 05/19/22 07/03/22 Yes Irving Copas, PA-C  Calcium Carbonate-Vitamin D (CALCIUM 600/VITAMIN D PO) Take 1 tablet by mouth 2 (two) times daily.   Yes [provider]  cetirizine (ZYRTEC) 10 MG tablet Take 10 mg by mouth daily as needed for allergies.   Yes [provider]  Cholecalciferol (VITAMIN D3) 50 MCG (2000 UT) TABS Take 2,000 Units by mouth in the morning.   Yes [provider]  dexlansoprazole (DEXILANT) 60 MG capsule Take 1 capsule by mouth daily Patient taking differently: Take 60 mg by mouth daily. 10/22/21  Yes Nandigam, Venia Minks, MD  fesoterodine (TOVIAZ) 8 MG TB24 tablet Take 1 tablet (8 mg total) by mouth daily. 02/18/22  Yes Baxley, Cresenciano Lick, MD  Krill Oil 500 MG CAPS Take 500 mg by mouth daily.   Yes [provider]  Lavender Oil 80 MG CAPS Take 160 mg by mouth at bedtime.   Yes [provider]  levothyroxine (SYNTHROID) 75 MCG tablet Take 1 tablet (75 mcg total) by mouth daily. 05/12/22  Yes Baxley, Cresenciano Lick, MD  linaclotide Rolan Lipa) 72 MCG capsule Take 1 capsule (72 mcg total) by mouth in the morning and at bedtime. 06/16/22  Yes Nandigam, Venia Minks, MD  lithium  carbonate 300 MG capsule Take 1 capsule (300 mg total) by mouth at bedtime. 05/12/22  Yes Cottle, Billey Co., MD  LORazepam (ATIVAN) 0.5 MG tablet TAKE 1/2 TO 1 TABLET BY MOUTH EVERY NIGHT AT BEDTIME AND 1 TABLET EVERY 8 HOURS AS NEEDED FOR ANXIETY Patient taking differently: Take 0.25-0.5 mg by mouth See admin instructions. TAKE 1/2 TO 1 TABLET BY MOUTH EVERY NIGHT AT BEDTIME AND 1 TABLET EVERY 8 HOURS AS NEEDED FOR ANXIETY 03/10/22  Yes Cottle, Billey Co., MD  memantine (NAMENDA) 10 MG tablet Take 1 tablet (10 mg total) by mouth 2 (two) times daily. 03/10/22  Yes Cottle, Billey Co., MD  methocarbamol (ROBAXIN) 500 MG tablet Take 1 tablet (500 mg total) by mouth every 6 (six) hours as needed for muscle spasms. 05/19/22  Yes Irving Copas, PA-C  mirtazapine (REMERON) 7.5 MG tablet Take 1 tablet (7.5 mg total) by mouth at bedtime. 03/10/22  Yes Cottle, Billey Co., MD  Multiple Vitamins-Minerals (MULTIVITAMIN WITH MINERALS) tablet Take 1 tablet by mouth daily.   Yes [provider]  Multiple Vitamins-Minerals (PRESERVISION AREDS 2 PO) Take 1 capsule by mouth in the morning and at bedtime.   Yes [provider]  naloxegol oxalate (MOVANTIK) 12.5 MG TABS tablet Take 2 tablets (25 mg total) by mouth daily. Start with 1 tablet daily and if tolerating well for a week increase to 2 tablets daily 05/24/22  Yes Nandigam, Venia Minks, MD  nortriptyline (PAMELOR) 75 MG capsule Take 1 capsule (75 mg total) by mouth at bedtime. 03/10/22  Yes Cottle, Billey Co., MD  ondansetron (ZOFRAN-ODT) 4 MG disintegrating tablet Place 1 tablet every 6 hours by translingual route as needed. 05/20/22  Yes   PARoxetine (PAXIL) 20 MG tablet Take 1 tablet (20 mg total) by mouth at bedtime. 05/10/22  Yes Cottle, Billey Co., MD  polyethylene glycol (MIRALAX / GLYCOLAX) 17 g packet Take 17 g by mouth daily as needed for mild constipation.   Yes [provider]  primidone (MYSOLINE) 50 MG  tablet Take 2 tablets by mouth  every morning and take 1 tablet by mouth every evening 05/14/22  Yes Cottle, Billey Co., MD  Pyridoxine HCl (VITAMIN B-6) 500 MG tablet Take 500 mg by mouth 2 (two) times daily.   Yes [provider]  rosuvastatin (CRESTOR) 40 MG tablet TAKE 1 TABLET (40 MG) BY MOUTH DAILY Patient taking differently: Take 40 mg by mouth daily. 01/12/22  Yes Baxley, Cresenciano Lick, MD  sennosides-docusate sodium (SENOKOT-S) 8.6-50 MG tablet Take 2 tablets by mouth at bedtime.   Yes [provider]  traMADol (ULTRAM) 50 MG tablet Take 1-2 tablets (50-100 mg total) by mouth every 6 (six) hours as needed for moderate pain or severe pain. 05/19/22  Yes Irving Copas, PA-C  valACYclovir (VALTREX) 1000 MG tablet Take 2 tablets by mouth at onset, then 2 tablets 12 hours later Patient taking differently: Take 2,000 mg by mouth as directed. 07/15/21  Yes   vitamin B-12 (CYANOCOBALAMIN) 1000 MCG tablet Take 1,000 mcg by mouth at bedtime.   Yes [provider]  primidone (MYSOLINE) 50 MG tablet TAKE 2 TABLETS BY MOUTH EVERY MORNING AND 1 TABLET EVERY EVENING Patient not taking: Reported on 07/01/2022 05/12/22   Purnell Shoemaker., MD    Physical Exam: Vitals:   07/01/22 1300 07/01/22 1400 07/01/22 1421 07/01/22 1500  BP: (!) 123/50 (!) 116/49  (!) 116/53  Pulse: 68 (!) 49  85  Resp: 18 17  16   Temp:   98 F (36.7 C)   TempSrc:   Oral   SpO2: 95% 97%  96%  Weight:      Height:       General:  Appears calm and comfortable and is in NAD Eyes:  PERRL, EOMI, normal lids, iris ENT:  grossly normal hearing, lips & tongue, mmm; appropriate dentition Neck:  no LAD, masses or thyromegaly; no carotid bruits Cardiovascular:  RRR, +systolic murmur. No LE edema.  Respiratory:   crackles in right mid to upper lobe.   Normal respiratory effort. Abdomen:  soft, NT, ND, NABS Back:   normal alignment, no CVAT Skin:  no rash or induration seen on limited exam Musculoskeletal:  grossly normal tone BUE/BLE, good  ROM, no bony abnormality Lower extremity:  No LE edema.  Limited foot exam with no ulcerations.  2+ distal pulses. Psychiatric:  grossly normal mood and affect, speech fluent and appropriate, AOx3 Neurologic:  CN 2-12 grossly intact, moves all extremities in coordinated fashion, sensation intact   Radiological Exams on Admission: Independently reviewed - see discussion in A/P where applicable  CT VENOGRAM HEAD  Result Date: 07/01/2022 CLINICAL DATA:  Headache, classic migraine headache; Headache, tension-type EXAM: CT ANGIOGRAPHY HEAD CT VENOGRAM HEAD TECHNIQUE: Multidetector CT imaging of the head was performed using the standard protocol during bolus administration of intravenous contrast. Venographic phase images of the brain were obtained following the administration of intravenous contrast. Multiplanar reformats and maximum intensity projections were generated. RADIATION DOSE REDUCTION: This exam was performed according to the departmental dose-optimization program which includes automated exposure control, adjustment of the mA and/or kV according to patient size and/or use of iterative reconstruction technique. CONTRAST:  16m OMNIPAQUE IOHEXOL 350 MG/ML SOLN COMPARISON:  Same day CT head. FINDINGS: CTA: Anterior circulation: Bilateral intracranial ICAs, MCAs, and ACAs are patent without proximal hemodynamically significant stenosis. No aneurysm identified. Posterior circulation: Left intradural vertebral artery is non dominant and terminates as PICA, anatomic variant. Right intradural vertebral artery, basilar artery and bilateral  posterior cerebral arteries are patent without proximal hemodynamically significant stenosis. No aneurysm identified CTV: No evidence of dural venous sinus thrombosis. The superior sagittal, transverse and sigmoid sinuses are patent. Narrowing of the left transverse sinus. The straight sinus and visualized deep cerebral veins are patent IMPRESSION: 1. No evidence of large  vessel occlusion or proximal hemodynamically significant stenosis. No aneurysm identified. 2. No evidence of dural venous sinus thrombosis. Electronically Signed   By: Margaretha Sheffield M.D.   On: 07/01/2022 15:04   CT Angio Head W or Wo Contrast  Result Date: 07/01/2022 CLINICAL DATA:  Headache, classic migraine headache; Headache, tension-type EXAM: CT ANGIOGRAPHY HEAD CT VENOGRAM HEAD TECHNIQUE: Multidetector CT imaging of the head was performed using the standard protocol during bolus administration of intravenous contrast. Venographic phase images of the brain were obtained following the administration of intravenous contrast. Multiplanar reformats and maximum intensity projections were generated. RADIATION DOSE REDUCTION: This exam was performed according to the departmental dose-optimization program which includes automated exposure control, adjustment of the mA and/or kV according to patient size and/or use of iterative reconstruction technique. CONTRAST:  17m OMNIPAQUE IOHEXOL 350 MG/ML SOLN COMPARISON:  Same day CT head. FINDINGS: CTA: Anterior circulation: Bilateral intracranial ICAs, MCAs, and ACAs are patent without proximal hemodynamically significant stenosis. No aneurysm identified. Posterior circulation: Left intradural vertebral artery is non dominant and terminates as PICA, anatomic variant. Right intradural vertebral artery, basilar artery and bilateral posterior cerebral arteries are patent without proximal hemodynamically significant stenosis. No aneurysm identified CTV: No evidence of dural venous sinus thrombosis. The superior sagittal, transverse and sigmoid sinuses are patent. Narrowing of the left transverse sinus. The straight sinus and visualized deep cerebral veins are patent IMPRESSION: 1. No evidence of large vessel occlusion or proximal hemodynamically significant stenosis. No aneurysm identified. 2. No evidence of dural venous sinus thrombosis. Electronically Signed   By:  FMargaretha SheffieldM.D.   On: 07/01/2022 15:04   CT Head Wo Contrast  Result Date: 07/01/2022 CLINICAL DATA:  Left-sided headache.  Cough. EXAM: CT HEAD WITHOUT CONTRAST TECHNIQUE: Contiguous axial images were obtained from the base of the skull through the vertex without intravenous contrast. RADIATION DOSE REDUCTION: This exam was performed according to the departmental dose-optimization program which includes automated exposure control, adjustment of the mA and/or kV according to patient size and/or use of iterative reconstruction technique. COMPARISON:  05/07/2016. FINDINGS: Brain: No evidence of acute infarction, hemorrhage, hydrocephalus, extra-axial collection or mass lesion/mass effect. Vascular: No hyperdense vessel or unexpected calcification. Skull: Normal. Negative for fracture or focal lesion. Sinuses/Orbits: Visualized globes and orbits are unremarkable. The visualized sinuses are clear. Other: None. IMPRESSION: Normal unenhanced CT scan of the brain. Electronically Signed   By: DLajean ManesM.D.   On: 07/01/2022 12:28   DG Chest 2 View  Result Date: 07/01/2022 CLINICAL DATA:  Headache, cough, malaise, hypertension EXAM: CHEST - 2 VIEW COMPARISON:  06/12/2018 FINDINGS: Normal heart size, mediastinal contours, and pulmonary vascularity. Atherosclerotic calcification aorta. LEFT subclavian sequential transvenous pacemaker leads project at RIGHT atrium and RIGHT ventricle. Patchy infiltrates identified greatest RIGHT upper lobe consistent with pneumonia. No pleural effusion or pneumothorax. BILATERAL breast prostheses. Bones demineralized. IMPRESSION: RIGHT upper lobe infiltrate consistent with pneumonia. Aortic Atherosclerosis (ICD10-I70.0). Electronically Signed   By: MLavonia DanaM.D.   On: 07/01/2022 10:09    EKG: Independently reviewed.  NSR with rate 69; nonspecific ST changes with no evidence of acute ischemia   Labs on Admission: I have personally reviewed the available  labs and  imaging studies at the time of the admission.  Pertinent labs:   wbc: 10.8,  hgb: 10.1,  covid negative,  ESR: 70,  Assessment and Plan: Principal Problem:   Community acquired pneumonia Active Problems:   Temporal headache   Elevated troponin   Macrocytic anemia   bipolar/depression   Sinus node dysfunction (HCC)   Memory loss   Hyperlipidemia   Hypothyroidism   Chronic constipation   GERD   OSA (obstructive sleep apnea)    Assessment and Plan: * Community acquired pneumonia 81 year old female with 3 day history of chills, dry cough, leukocytosis and CXR findings consistent for RUL pneumonia with a CURB 65 score of 2 -obs to telemetry -check urinary studies -check PCT and RVP  -continue rocephin and azithromycin for CAP -albuterol PRN -robitussin PRN -IS to bedside -gentle IVF   Temporal headache 3 day history of left sided temporal headache with some radiation to right side. Non tender on exam, no pain with chewing, no phonophobia/photophobia.  -CT head/venogram and CTA with no acute findings -ESR to 70, CRP pending -x1 dose of toradol 31m -gentle IVF -neurology consulted for concerns for TA -MRI ordered, but with PPM may not be able to do until tomorrow -if concerns for TA will need biopsy, but will wait for neurology to see   Elevated troponin She has no chest pain or ekg changes Initial troponin 44, delta pending Check BNP Likely due to possible infection vs. Hypoperfusion as her blood pressures are soft.  Continue to monitor on tele, f/u on delta   Macrocytic anemia hgb nadir of 9.7 after hip surgery in August Will check iron studies, B12 and folate  bipolar/depression History of ECT in past Continue home medication of paxil, pamelor, remeron and ativan PRN and lithium Check lithium level   [place] ([place] S/p [facility] PPM in 03/2018   Memory loss Continue namenda Care giver to stay overnight with her Delirium  precautions  Hyperlipidemia Continue crestor   Hypothyroidism TSH wnl in 03/2022 Continue home synthroid   Chronic constipation Continue home senokot, linzess, miralaz prn Also has movik prn  Gentle IVF  GERD Continue PPI   OSA (obstructive sleep apnea) Not on cpap, non compliant     Advance Care Planning:   Code Status: Full Code   Consults: neurology   DVT Prophylaxis: lovenox   Family Communication: caregiver and husband at bedside.   Severity of Illness: The appropriate patient status for this patient is OBSERVATION. Observation status is judged to be reasonable and necessary in order to provide the required intensity of service to ensure the patient's safety. The patient's presenting symptoms, physical exam findings, and initial radiographic and laboratory data in the context of their medical condition is felt to place them at decreased risk for further clinical deterioration. Furthermore, it is anticipated that the patient will be medically stable for discharge from the hospital within 2 midnights of admission.   Author: AOrma Flaming MD 07/01/2022 7:19 PM  For on call review [email or web address]

## 2022-07-01 NOTE — Assessment & Plan Note (Signed)
S/p [facility] PPM in 03/2018

## 2022-07-01 NOTE — Plan of Care (Signed)
Neurology plan of care  Neurology was contacted by Dr. Truett Mainland [facility] EDP about this patient who presented with sx PNA and also reported new L temporal headache x3 days. She had a similar headache 3 weeks ago that resolved, but no headache hx before that. Denies visual sx. No jaw claudication. Does have malaise although in the context of PNA. ESR elevated at 70, CRP pending.  New onset headache in patient >50 is considered a red flag sx. Ddx includes GCA, increased intracranial pressure 2/2 mass lesion or other, venous sinus thrombosis.  I recommended ED to ED transfer to [facility] for the following: - MRI brain wwo contrast r/o mass lesion - CTA head r/o aneurysm - CTV r/o DVST - F/u CRP - Consult neurology from [facility] ED for further evaluation and guidance.  D/w Dr. Truett Mainland by phone  Su Monks, MD [facility] [phone]  If [place], please page neurology on call as listed in [place].

## 2022-07-01 NOTE — Assessment & Plan Note (Signed)
hgb nadir of 9.7 after hip surgery in August Will check iron studies, B12 and folate

## 2022-07-01 NOTE — Assessment & Plan Note (Signed)
81 year old female with 3 day history of chills, dry cough, leukocytosis and CXR findings consistent for RUL pneumonia with a CURB 65 score of 2 -obs to telemetry -check urinary studies -check PCT and RVP  -continue rocephin and azithromycin for CAP -albuterol PRN -robitussin PRN -IS to bedside -gentle IVF

## 2022-07-01 NOTE — ED Provider Notes (Signed)
Patient transferred from outside facility for MRI brain.  She presented to ED with 3-day history of cough and headache.  Diagnosed with pneumonia.  Denies shortness of breath or chest pain.  She had left-sided temporal headache and neurology recommended transfer for neurology evaluation for consideration of temporal arteritis.  CT head, CTA and CTV were all negative.  She does have a pacemaker, uncertain if it is MRI compatible.  MRI tech is investigating whether pacemaker is MRI compatible.  Discussed with Dr. Lorrin Goodell neurology.  He will evaluate patient.  Does not recommend empiric steroids until he sees patient.  From a pneumonia standpoint she has no hypoxia or increased work of breathing.  Plan admission for neurology evaluation and MRI when available.  Discussed with Dr. Rogers Blocker.   Ezequiel Essex, MD 07/01/22 [phone]

## 2022-07-01 NOTE — Assessment & Plan Note (Addendum)
3 day history of left sided temporal headache with some radiation to right side. Non tender on exam, no pain with chewing, no phonophobia/photophobia.  -CT head/venogram and CTA with no acute findings -ESR to 70, CRP pending -x1 dose of toradol 80m -gentle IVF -neurology consulted for concerns for TA -MRI ordered, but with PPM may not be able to do until tomorrow -if concerns for TA will need biopsy, but will wait for neurology to see

## 2022-07-01 NOTE — ED Provider Notes (Signed)
[facility] EMERGENCY DEPT Provider Note  CSN: [id number] Arrival date & time: 07/01/22 4680  Chief Complaint(s) Headache  HPI Meghan Oliver is a 81 y.o. female with history of CHF, CKD, hyperlipidemia, hypertension, coronary artery disease presenting to the emergency department with cough.  Patient reports 2 to 3 days of cough, cough is nonproductive, no associated fevers or chills, no shortness of breath.  No chest pain.  Reports generalized weakness.  Also reports 3 days of left-sided headache, located around the temple.  She reports the pain is intermittent, severe.  Denies associated visual changes, pain with chewing, nausea or vomiting, head trauma.  She had a similar episode around a month ago which resolved on its own.   Past Medical History Past Medical History:  Diagnosis Date   Anxiety    Arthritis    "hips, spine" (03/17/2018)   Bipolar II disorder (HCC)    CHF (congestive heart failure) (HCC)    Chronic bronchitis (HCC)    Chronic lower back pain    Chronic right hip pain    CKD (chronic kidney disease), stage II    Coronary artery disease    stent x1   Esophagitis, erosive    GAD (generalized anxiety disorder)    GERD (gastroesophageal reflux disease)    Headache    "maybe monthly" (03/17/2018))   Heart murmur, systolic    History of adenomatous polyp of colon    08-04-2016  tubular adenoma   History of blood transfusion 12/2017   "related to vascular hematoma"   History of electroconvulsive therapy    at [facility]--  started 04-15-2015 to 11-17-2016  total greater than 40 times   History of hiatal hernia    Hyperlipidemia    Hypertension    Hypothyroidism    Internal carotid artery stenosis, bilateral    per last duplex 05-01-2014  bilateral ICA 40-59%   Major depression, chronic    ECT treatments extensive and multiple started 07/ 2016   Memory loss    "both short and long-term; needs frequent reminders to follow instrucitons" (05/16/2017)    Migraines    "none in years" (03/17/2018)   OSA (obstructive sleep apnea)    per study 06/ 2012 moderate OSA  ; "refuses to wear masks" (03/17/2018)   Osteoporosis    Poor historian    due to short term memory loss   Presence of permanent cardiac pacemaker 03/17/2018   Pulmonary nodule    monitored by pcp   S/P placement of cardiac pacemaker 03/17/18 [facility]  03/18/2018   Short-term memory loss    Sick sinus syndrome [facility])    Patient Active Problem List   Diagnosis Date Noted   S/P total right hip arthroplasty 05/18/2022   Overactive bladder 02/19/2022   S/P placement of cardiac pacemaker 03/17/18 [facility]  03/18/2018   Sinus node dysfunction (HCC) 03/17/2018   Bradycardia 07/16/2017   Unstable angina (HCC)    Hypotension 04/20/2017   CAD (coronary artery disease) 01/05/2016   Coronary artery calcification seen on CAT scan 06/25/2012   Lung nodule 06/25/2012   Hypothyroidism 03/21/2012   Chronic cough 08/12/2011   OSA (obstructive sleep apnea) 04/05/2011   Essential hypertension 11/06/2010   EDEMA 11/06/2010   Chest pain 11/06/2010   Hyperlipidemia 08/22/2007   Bipolar disorder ([place]) 08/22/2007   GERD 08/22/2007   LOW BACK PAIN 08/22/2007   OSTEOPOROSIS 08/22/2007   INSOMNIA 32/09/2481   SYSTOLIC MURMUR 50/12/7046   Home Medication(s) Prior to Admission medications  Medication Sig Start Date End Date Taking? Authorizing Provider  acetaminophen (TYLENOL) 500 MG tablet Take 2 tablets (1,000 mg total) by mouth every 6 (six) hours. 05/19/22  Yes Irving Copas, PA-C  aspirin 81 MG chewable tablet Chew 1 tablet (81 mg total) by mouth 2 (two) times daily for 28 days. Then resume normal dose of aspirin 81 mg daily. 05/19/22 07/03/22 Yes Irving Copas, PA-C  Calcium Carbonate-Vitamin D (CALCIUM 600/VITAMIN D PO) Take 1 tablet by mouth 2 (two) times daily.   Yes [provider]  cetirizine (ZYRTEC) 10 MG tablet Take 10 mg by mouth daily as needed for allergies.   Yes  [provider]  Cholecalciferol (VITAMIN D3) 50 MCG (2000 UT) TABS Take 2,000 Units by mouth in the morning.   Yes [provider]  dexlansoprazole (DEXILANT) 60 MG capsule Take 1 capsule by mouth daily Patient taking differently: Take 60 mg by mouth daily. 10/22/21  Yes Nandigam, Venia Minks, MD  fesoterodine (TOVIAZ) 8 MG TB24 tablet Take 1 tablet (8 mg total) by mouth daily. 02/18/22  Yes Baxley, Cresenciano Lick, MD  Krill Oil 500 MG CAPS Take 500 mg by mouth daily.   Yes [provider]  Lavender Oil 80 MG CAPS Take 160 mg by mouth at bedtime.   Yes [provider]  levothyroxine (SYNTHROID) 75 MCG tablet Take 1 tablet (75 mcg total) by mouth daily. 05/12/22  Yes Baxley, Cresenciano Lick, MD  linaclotide Rolan Lipa) 72 MCG capsule Take 1 capsule (72 mcg total) by mouth in the morning and at bedtime. 06/16/22  Yes Nandigam, Venia Minks, MD  lithium carbonate 300 MG capsule Take 1 capsule (300 mg total) by mouth at bedtime. 05/12/22  Yes Cottle, Billey Co., MD  LORazepam (ATIVAN) 0.5 MG tablet TAKE 1/2 TO 1 TABLET BY MOUTH EVERY NIGHT AT BEDTIME AND 1 TABLET EVERY 8 HOURS AS NEEDED FOR ANXIETY Patient taking differently: Take 0.25-0.5 mg by mouth See admin instructions. TAKE 1/2 TO 1 TABLET BY MOUTH EVERY NIGHT AT BEDTIME AND 1 TABLET EVERY 8 HOURS AS NEEDED FOR ANXIETY 03/10/22  Yes Cottle, Billey Co., MD  memantine (NAMENDA) 10 MG tablet Take 1 tablet (10 mg total) by mouth 2 (two) times daily. 03/10/22  Yes Cottle, Billey Co., MD  methocarbamol (ROBAXIN) 500 MG tablet Take 1 tablet (500 mg total) by mouth every 6 (six) hours as needed for muscle spasms. 05/19/22  Yes Irving Copas, PA-C  mirtazapine (REMERON) 7.5 MG tablet Take 1 tablet (7.5 mg total) by mouth at bedtime. 03/10/22  Yes Cottle, Billey Co., MD  Multiple Vitamins-Minerals (MULTIVITAMIN WITH MINERALS) tablet Take 1 tablet by mouth daily.   Yes [provider]  Multiple Vitamins-Minerals (PRESERVISION AREDS 2 PO) Take  1 capsule by mouth in the morning and at bedtime.   Yes [provider]  naloxegol oxalate (MOVANTIK) 12.5 MG TABS tablet Take 2 tablets (25 mg total) by mouth daily. Start with 1 tablet daily and if tolerating well for a week increase to 2 tablets daily 05/24/22  Yes Nandigam, Venia Minks, MD  nortriptyline (PAMELOR) 75 MG capsule Take 1 capsule (75 mg total) by mouth at bedtime. 03/10/22  Yes Cottle, Billey Co., MD  ondansetron (ZOFRAN-ODT) 4 MG disintegrating tablet Place 1 tablet every 6 hours by translingual route as needed. 05/20/22  Yes   PARoxetine (PAXIL) 20 MG tablet Take 1 tablet (20 mg total) by mouth at bedtime. 05/10/22  Yes Cottle, Hiram Comber  Fredirick Maudlin., MD  polyethylene glycol (MIRALAX / GLYCOLAX) 17 g packet Take 17 g by mouth daily as needed for mild constipation.   Yes [provider]  primidone (MYSOLINE) 50 MG tablet Take 2 tablets by mouth every morning and take 1 tablet by mouth every evening 05/14/22  Yes Cottle, Billey Co., MD  Pyridoxine HCl (VITAMIN B-6) 500 MG tablet Take 500 mg by mouth 2 (two) times daily.   Yes [provider]  rosuvastatin (CRESTOR) 40 MG tablet TAKE 1 TABLET (40 MG) BY MOUTH DAILY Patient taking differently: Take 40 mg by mouth daily. 01/12/22  Yes Baxley, Cresenciano Lick, MD  sennosides-docusate sodium (SENOKOT-S) 8.6-50 MG tablet Take 2 tablets by mouth at bedtime.   Yes [provider]  traMADol (ULTRAM) 50 MG tablet Take 1-2 tablets (50-100 mg total) by mouth every 6 (six) hours as needed for moderate pain or severe pain. 05/19/22  Yes Irving Copas, PA-C  valACYclovir (VALTREX) 1000 MG tablet Take 2 tablets by mouth at onset, then 2 tablets 12 hours later Patient taking differently: Take 2,000 mg by mouth as directed. 07/15/21  Yes   vitamin B-12 (CYANOCOBALAMIN) 1000 MCG tablet Take 1,000 mcg by mouth at bedtime.   Yes [provider]  primidone (MYSOLINE) 50 MG tablet TAKE 2 TABLETS BY MOUTH EVERY MORNING AND 1 TABLET EVERY  EVENING Patient not taking: Reported on 07/01/2022 05/12/22   Cottle, Billey Co., MD                                                                                                                                    Past Surgical History Past Surgical History:  Procedure Laterality Date   APPENDECTOMY  1971   AUGMENTATION MAMMAPLASTY Bilateral    CARDIOVASCULAR STRESS TEST  01-30-2015  dr Aundra Dubin   Low risk nuclear study w/ no evidence ischemia or infarction/  normal LV funciton and wall motion , 76%   COLONOSCOPY W/ BIOPSIES AND POLYPECTOMY  "multiple"   COLONOSCOPY WITH ESOPHAGOGASTRODUODENOSCOPY (EGD)  last one 08-04-2016   CORONARY ANGIOPLASTY WITH STENT PLACEMENT  05/16/2017   "LAD"   CORONARY STENT INTERVENTION N/A 05/16/2017   Procedure: CORONARY STENT INTERVENTION;  Surgeon: Burnell Blanks, MD;  Location: [facility];  Service: Cardiovascular;  Laterality: N/A;   ESOPHAGOGASTRODUODENOSCOPY  02-26-04   ESOPHAGOGASTRODUODENOSCOPY  "multiple"   INSERT / REPLACE / REMOVE PACEMAKER  03/17/2018   LEFT HEART CATH AND CORONARY ANGIOGRAPHY N/A 05/16/2017   Procedure: LEFT HEART CATH AND CORONARY ANGIOGRAPHY;  Surgeon: Larey Dresser, MD;  Location: [facility];  Service: Cardiovascular;  Laterality: N/A;   OVARIAN CYST SURGERY  1970s   Laparotomy    PACEMAKER IMPLANT N/A 03/17/2018   Procedure: PACEMAKER IMPLANT;  Surgeon: Evans Lance, MD;  Location: [facility];  Service: Cardiovascular;  Laterality: N/A;   PORT-A-CATH PLACEMENT  05/31/2016; 2018   "@  [facility]; for ECT series"; "@ [facility] also"   PORT-A-CATH REMOVAL N/A 03/11/2017   Procedure: REMOVAL PORT-A-CATH;  Surgeon: Jackolyn Confer, MD;  Location[facility];  Service: General;  Laterality: N/A;   PORTA CATH REMOVAL  03/17/2018   PORTA CATH REMOVAL  03/17/2018   Procedure: PORTA CATH REMOVAL;  Surgeon: Evans Lance, MD;  Location: [facility];  Service: Cardiovascular;;   TOTAL HIP  ARTHROPLASTY Right 05/18/2022   Procedure: TOTAL HIP ARTHROPLASTY ANTERIOR APPROACH;  Surgeon: Paralee Cancel, MD;  Location: [facility];  Service: Orthopedics;  Laterality: Right;   TRANSTHORACIC ECHOCARDIOGRAM  09/05/2013  dr Aundra Dubin   mild LVH, ef 63-89%, grade 1 diastolic dysfunction/  very mild AV stenosis with mild AR/  trivial MR and PT/ mild to moderate LAE/ mild TR/ mild pulmonary hypertension with PA peak pressure 76mHg   TUBAL LIGATION     Family History Family History  Problem Relation Age of Onset   Heart attack Father 531      deceased   Hypertension Father    Heart disease Father    Breast cancer Paternal G37       Age unknown   Breast cancer Paternal Aunt        Age 82's  Colon cancer Neg Hx     Social History Social History   Tobacco Use   Smoking status: Former    Packs/day: 2.00    Years: 15.00    Total pack years: 30.00    Types: Cigarettes    Quit date: 01/13/1971    Years since quitting: 51.4   Smokeless tobacco: Never  Vaping Use   Vaping Use: Never used  Substance Use Topics   Alcohol use: Yes    Comment: occassional 1 x a week   Drug use: Never   Allergies Propranolol and Brexpiprazole  Review of Systems Review of Systems  All other systems reviewed and are negative.   Physical Exam Vital Signs  I have reviewed the triage vital signs BP (!) 116/49   Pulse (!) 49   Temp 98 F (36.7 C) (Oral)   Resp 17   Ht 4' 10"  (1.473 m)   Wt 42.6 kg   LMP  (LMP Unknown)   SpO2 97%   BMI 19.65 kg/m  Physical Exam Vitals and nursing note reviewed.  Constitutional:      General: She is not in acute distress.    Appearance: She is well-developed.  HENT:     Head: Normocephalic and atraumatic.     Comments: No temporal tenderness on exam    Mouth/Throat:     Mouth: Mucous membranes are moist.  Eyes:     Pupils: Pupils are equal, round, and reactive to light.  Cardiovascular:     Rate and Rhythm: Normal rate and regular rhythm.      Heart sounds: No murmur heard. Pulmonary:     Effort: Pulmonary effort is normal. No respiratory distress.     Breath sounds: Rales (right middle lobe) present.  Abdominal:     General: Abdomen is flat.     Palpations: Abdomen is soft.     Tenderness: There is no abdominal tenderness.  Musculoskeletal:        General: No tenderness.     Right lower leg: No edema.     Left lower leg: No edema.  Skin:    General: Skin is warm and dry.  Neurological:     General: No focal deficit  present.     Mental Status: She is alert. Mental status is at baseline.     Comments: Cranial nerves II through XII intact, strength 5 out of 5 in the bilateral upper and lower extremities, no sensory deficit to light touch, no dysmetria on finger-nose-finger testing  Psychiatric:        Mood and Affect: Mood normal.        Behavior: Behavior normal.     ED Results and Treatments Labs (all labs ordered are listed, but only abnormal results are displayed) Labs Reviewed  BASIC METABOLIC PANEL - Abnormal; Notable for the following components:      Result Value   BUN 24 (*)    All other components within normal limits  CBC WITH DIFFERENTIAL/PLATELET - Abnormal; Notable for the following components:   WBC 10.8 (*)    RBC 3.13 (*)    Hemoglobin 10.1 (*)    HCT 32.3 (*)    MCV 103.2 (*)    Neutro Abs 9.1 (*)    Abs Immature Granulocytes 0.22 (*)    All other components within normal limits  SEDIMENTATION RATE - Abnormal; Notable for the following components:   Sed Rate 70 (*)    All other components within normal limits  RESP PANEL BY RT-PCR (FLU A&B, COVID) ARPGX2                                                                                                                          Radiology CT Head Wo Contrast  Result Date: 07/01/2022 CLINICAL DATA:  Left-sided headache.  Cough. EXAM: CT HEAD WITHOUT CONTRAST TECHNIQUE: Contiguous axial images were obtained from the base of the skull through the  vertex without intravenous contrast. RADIATION DOSE REDUCTION: This exam was performed according to the departmental dose-optimization program which includes automated exposure control, adjustment of the mA and/or kV according to patient size and/or use of iterative reconstruction technique. COMPARISON:  05/07/2016. FINDINGS: Brain: No evidence of acute infarction, hemorrhage, hydrocephalus, extra-axial collection or mass lesion/mass effect. Vascular: No hyperdense vessel or unexpected calcification. Skull: Normal. Negative for fracture or focal lesion. Sinuses/Orbits: Visualized globes and orbits are unremarkable. The visualized sinuses are clear. Other: None. IMPRESSION: Normal unenhanced CT scan of the brain. Electronically Signed   By: Lajean Manes M.D.   On: 07/01/2022 12:28   DG Chest 2 View  Result Date: 07/01/2022 CLINICAL DATA:  Headache, cough, malaise, hypertension EXAM: CHEST - 2 VIEW COMPARISON:  06/12/2018 FINDINGS: Normal heart size, mediastinal contours, and pulmonary vascularity. Atherosclerotic calcification aorta. LEFT subclavian sequential transvenous pacemaker leads project at RIGHT atrium and RIGHT ventricle. Patchy infiltrates identified greatest RIGHT upper lobe consistent with pneumonia. No pleural effusion or pneumothorax. BILATERAL breast prostheses. Bones demineralized. IMPRESSION: RIGHT upper lobe infiltrate consistent with pneumonia. Aortic Atherosclerosis (ICD10-I70.0). Electronically Signed   By: Lavonia Dana M.D.   On: 07/01/2022 10:09    Pertinent labs & imaging results that were available during my care of the  patient were reviewed by me and considered in my medical decision making (see MDM for details).  Medications Ordered in ED Medications  cefTRIAXone (ROCEPHIN) 1 g in sodium chloride 0.9 % 100 mL IVPB (0 g Intravenous Stopped 07/01/22 1348)  azithromycin (ZITHROMAX) 500 mg in sodium chloride 0.9 % 250 mL IVPB (0 mg Intravenous Stopped 07/01/22 1348)  iohexol  (OMNIPAQUE) 350 MG/ML injection 100 mL (72 mLs Intravenous Contrast Given 07/01/22 1451)  oxyCODONE-acetaminophen (PERCOCET/ROXICET) 5-325 MG per tablet 1 tablet (1 tablet Oral Given 07/01/22 1425)                                                                                                                                     Procedures Procedures  (including critical care time)  Medical Decision Making / ED Course   MDM:  81 year old female presenting to the emergency department with cough and headache.  Patient overall well-appearing, vital signs reassuring.  Exam notable for right-sided crackles.  Chest x-ray demonstrates right-sided pneumonia which is likely the cause of the patient's focal breath sounds and cough.  Will obtain some basic labs to evaluate for need for admission.  No sign of pneumothorax, COVID test negative, no chest pain to suggest cardiovascular pathology.  No sign of volume overload on exam.  Patient also with headache, given age will obtain CT scan.  No temporal artery tenderness to suggest temporal arteritis but location is concerning.  We will also obtain inflammatory markers.  If inflammatory markers are negative, likely would recommend outpatient follow-up.  If laboratory markers are elevated will likely discuss with neurology.  Clinical Course as of 07/01/22 1455  Thu Jul 01, 2022  1327 Discussed with Dr. Quinn Axe. She recommends CTA and CTV brain, recommends MRI W+WO brain given elevated ESR. Recommends [facility] transfer to [facility] so patient can have an in person evaluation.  [WS]  [facility] Dr. Philip Aspen has accepted patient for ER to ER transfer for [facility].  Have ordered recommended CTA, CTV, MRI.  Patient also with pneumonia.  Is received 1 dose of ceftriaxone and azithromycin. [WS]    Clinical Course User Index [WS] Cristie Hem, MD     Additional history obtained: -Additional history obtained from family -External records from outside source obtained and  reviewed including: Chart review including previous notes, labs, imaging, consultation notes   Lab Tests: -I ordered, reviewed, and interpreted labs.   The pertinent results include:   Labs Reviewed  BASIC METABOLIC PANEL - Abnormal; Notable for the following components:      Result Value   BUN 24 (*)    All other components within normal limits  CBC WITH DIFFERENTIAL/PLATELET - Abnormal; Notable for the following components:   WBC 10.8 (*)    RBC 3.13 (*)    Hemoglobin 10.1 (*)    HCT 32.3 (*)    MCV 103.2 (*)    Neutro Abs 9.1 (*)    Abs  Immature Granulocytes 0.22 (*)    All other components within normal limits  SEDIMENTATION RATE - Abnormal; Notable for the following components:   Sed Rate 70 (*)    All other components within normal limits  RESP PANEL BY RT-PCR (FLU A&B, COVID) ARPGX2      EKG   EKG Interpretation  Date/Time:    Ventricular Rate:    PR Interval:    QRS Duration:   QT Interval:    QTC Calculation:   R Axis:     Text Interpretation:           Imaging Studies ordered: I ordered imaging studies including CT head, CXR On my interpretation imaging demonstrates pneumonia, CT head negative I independently visualized and interpreted imaging. I agree with the radiologist interpretation   Medicines ordered and prescription drug management: Meds ordered this encounter  Medications   cefTRIAXone (ROCEPHIN) 1 g in sodium chloride 0.9 % 100 mL IVPB    Order Specific Question:   Antibiotic Indication:    Answer:   CAP   azithromycin (ZITHROMAX) 500 mg in sodium chloride 0.9 % 250 mL IVPB   iohexol (OMNIPAQUE) 350 MG/ML injection 100 mL   oxyCODONE-acetaminophen (PERCOCET/ROXICET) 5-325 MG per tablet 1 tablet    -I have reviewed the patients home medicines and have made adjustments as needed   Consultations Obtained: I requested consultation with the neurologist,  and discussed lab and imaging findings as well as pertinent plan - they recommend:  MRI, CTH, CTV, transfer to [facility]   Cardiac Monitoring: The patient was maintained on a cardiac monitor.  I personally viewed and interpreted the cardiac monitored which showed an underlying rhythm of: NSR  Social Determinants of Health:  Factors impacting patients care include: former smoker   Reevaluation: After the interventions noted above, I reevaluated the patient and found that they have improved  Co morbidities that complicate the patient evaluation  Past Medical History:  Diagnosis Date   Anxiety    Arthritis    "hips, spine" (03/17/2018)   Bipolar II disorder (HCC)    CHF (congestive heart failure) (HCC)    Chronic bronchitis (HCC)    Chronic lower back pain    Chronic right hip pain    CKD (chronic kidney disease), stage II    Coronary artery disease    stent x1   Esophagitis, erosive    GAD (generalized anxiety disorder)    GERD (gastroesophageal reflux disease)    Headache    "maybe monthly" (03/17/2018))   Heart murmur, systolic    History of adenomatous polyp of colon    08-04-2016  tubular adenoma   History of blood transfusion 12/2017   "related to vascular hematoma"   History of electroconvulsive therapy    at [facility]--  started 04-15-2015 to 11-17-2016  total greater than 40 times   History of hiatal hernia    Hyperlipidemia    Hypertension    Hypothyroidism    Internal carotid artery stenosis, bilateral    per last duplex 05-01-2014  bilateral ICA 40-59%   Major depression, chronic    ECT treatments extensive and multiple started 07/ 2016   Memory loss    "both short and long-term; needs frequent reminders to follow instrucitons" (05/16/2017)   Migraines    "none in years" (03/17/2018)   OSA (obstructive sleep apnea)    per study 06/ 2012 moderate OSA  ; "refuses to wear masks" (03/17/2018)   Osteoporosis    Poor historian  due to short term memory loss   Presence of permanent cardiac pacemaker 03/17/2018   Pulmonary nodule    monitored by pcp    S/P placement of cardiac pacemaker 03/17/18 [facility]  03/18/2018   Short-term memory loss    Sick sinus syndrome (HCC)       Dispostion: Transfer    Final Clinical Impression(s) / ED Diagnoses Final diagnoses:  Community acquired pneumonia of right middle lobe of lung  Left temporal headache  Elevated erythrocyte sedimentation rate     This chart was dictated using voice recognition software.  Despite best efforts to proofread,  errors can occur which can change the documentation meaning.    Cristie Hem, MD 07/01/22 [phone]

## 2022-07-01 NOTE — Assessment & Plan Note (Signed)
Continue crestor 

## 2022-07-01 NOTE — Assessment & Plan Note (Signed)
Continue home senokot, linzess, miralaz prn Also has movik prn  Gentle IVF

## 2022-07-01 NOTE — ED Notes (Signed)
RT note: Pt. seen in triage/assessed, RT to monitor.

## 2022-07-01 NOTE — Assessment & Plan Note (Addendum)
Continue namenda Care giver to stay overnight with her Delirium precautions

## 2022-07-02 ENCOUNTER — Inpatient Hospital Stay (HOSPITAL_COMMUNITY): Payer: Medicare Other

## 2022-07-02 ENCOUNTER — Other Ambulatory Visit (HOSPITAL_BASED_OUTPATIENT_CLINIC_OR_DEPARTMENT_OTHER): Payer: Self-pay

## 2022-07-02 ENCOUNTER — Observation Stay (HOSPITAL_COMMUNITY): Payer: Medicare Other

## 2022-07-02 DIAGNOSIS — G4733 Obstructive sleep apnea (adult) (pediatric): Secondary | ICD-10-CM | POA: Diagnosis present

## 2022-07-02 DIAGNOSIS — K449 Diaphragmatic hernia without obstruction or gangrene: Secondary | ICD-10-CM | POA: Diagnosis not present

## 2022-07-02 DIAGNOSIS — I10 Essential (primary) hypertension: Secondary | ICD-10-CM

## 2022-07-02 DIAGNOSIS — R7989 Other specified abnormal findings of blood chemistry: Secondary | ICD-10-CM | POA: Diagnosis not present

## 2022-07-02 DIAGNOSIS — E785 Hyperlipidemia, unspecified: Secondary | ICD-10-CM | POA: Diagnosis present

## 2022-07-02 DIAGNOSIS — I700 Atherosclerosis of aorta: Secondary | ICD-10-CM | POA: Diagnosis not present

## 2022-07-02 DIAGNOSIS — G4489 Other headache syndrome: Secondary | ICD-10-CM | POA: Diagnosis not present

## 2022-07-02 DIAGNOSIS — I5022 Chronic systolic (congestive) heart failure: Secondary | ICD-10-CM | POA: Diagnosis not present

## 2022-07-02 DIAGNOSIS — R059 Cough, unspecified: Secondary | ICD-10-CM | POA: Diagnosis not present

## 2022-07-02 DIAGNOSIS — I110 Hypertensive heart disease with heart failure: Secondary | ICD-10-CM | POA: Diagnosis not present

## 2022-07-02 DIAGNOSIS — J180 Bronchopneumonia, unspecified organism: Secondary | ICD-10-CM | POA: Diagnosis present

## 2022-07-02 DIAGNOSIS — J189 Pneumonia, unspecified organism: Secondary | ICD-10-CM | POA: Diagnosis not present

## 2022-07-02 DIAGNOSIS — R7982 Elevated C-reactive protein (CRP): Secondary | ICD-10-CM | POA: Diagnosis not present

## 2022-07-02 DIAGNOSIS — Z87891 Personal history of nicotine dependence: Secondary | ICD-10-CM | POA: Diagnosis not present

## 2022-07-02 DIAGNOSIS — Z1152 Encounter for screening for COVID-19: Secondary | ICD-10-CM | POA: Diagnosis not present

## 2022-07-02 DIAGNOSIS — J42 Unspecified chronic bronchitis: Secondary | ICD-10-CM | POA: Diagnosis present

## 2022-07-02 DIAGNOSIS — I509 Heart failure, unspecified: Secondary | ICD-10-CM | POA: Diagnosis not present

## 2022-07-02 DIAGNOSIS — D539 Nutritional anemia, unspecified: Secondary | ICD-10-CM | POA: Diagnosis present

## 2022-07-02 DIAGNOSIS — R918 Other nonspecific abnormal finding of lung field: Secondary | ICD-10-CM | POA: Diagnosis not present

## 2022-07-02 DIAGNOSIS — N182 Chronic kidney disease, stage 2 (mild): Secondary | ICD-10-CM | POA: Diagnosis present

## 2022-07-02 DIAGNOSIS — I130 Hypertensive heart and chronic kidney disease with heart failure and stage 1 through stage 4 chronic kidney disease, or unspecified chronic kidney disease: Secondary | ICD-10-CM | POA: Diagnosis present

## 2022-07-02 DIAGNOSIS — F3181 Bipolar II disorder: Secondary | ICD-10-CM | POA: Diagnosis present

## 2022-07-02 DIAGNOSIS — I272 Pulmonary hypertension, unspecified: Secondary | ICD-10-CM | POA: Diagnosis present

## 2022-07-02 DIAGNOSIS — R519 Headache, unspecified: Secondary | ICD-10-CM

## 2022-07-02 DIAGNOSIS — R700 Elevated erythrocyte sedimentation rate: Secondary | ICD-10-CM | POA: Diagnosis present

## 2022-07-02 DIAGNOSIS — I5032 Chronic diastolic (congestive) heart failure: Secondary | ICD-10-CM | POA: Diagnosis present

## 2022-07-02 DIAGNOSIS — F0393 Unspecified dementia, unspecified severity, with mood disturbance: Secondary | ICD-10-CM | POA: Diagnosis present

## 2022-07-02 DIAGNOSIS — I6523 Occlusion and stenosis of bilateral carotid arteries: Secondary | ICD-10-CM | POA: Diagnosis present

## 2022-07-02 DIAGNOSIS — I2510 Atherosclerotic heart disease of native coronary artery without angina pectoris: Secondary | ICD-10-CM | POA: Diagnosis not present

## 2022-07-02 DIAGNOSIS — K5909 Other constipation: Secondary | ICD-10-CM | POA: Diagnosis not present

## 2022-07-02 DIAGNOSIS — J90 Pleural effusion, not elsewhere classified: Secondary | ICD-10-CM | POA: Diagnosis present

## 2022-07-02 DIAGNOSIS — E222 Syndrome of inappropriate secretion of antidiuretic hormone: Secondary | ICD-10-CM | POA: Diagnosis present

## 2022-07-02 DIAGNOSIS — E039 Hypothyroidism, unspecified: Secondary | ICD-10-CM | POA: Diagnosis present

## 2022-07-02 DIAGNOSIS — K219 Gastro-esophageal reflux disease without esophagitis: Secondary | ICD-10-CM | POA: Diagnosis present

## 2022-07-02 DIAGNOSIS — J948 Other specified pleural conditions: Secondary | ICD-10-CM | POA: Diagnosis not present

## 2022-07-02 DIAGNOSIS — F05 Delirium due to known physiological condition: Secondary | ICD-10-CM | POA: Diagnosis not present

## 2022-07-02 DIAGNOSIS — F0394 Unspecified dementia, unspecified severity, with anxiety: Secondary | ICD-10-CM | POA: Diagnosis present

## 2022-07-02 DIAGNOSIS — M316 Other giant cell arteritis: Secondary | ICD-10-CM | POA: Diagnosis not present

## 2022-07-02 DIAGNOSIS — Z79899 Other long term (current) drug therapy: Secondary | ICD-10-CM | POA: Diagnosis not present

## 2022-07-02 DIAGNOSIS — R748 Abnormal levels of other serum enzymes: Secondary | ICD-10-CM | POA: Diagnosis not present

## 2022-07-02 DIAGNOSIS — Z9889 Other specified postprocedural states: Secondary | ICD-10-CM | POA: Diagnosis not present

## 2022-07-02 DIAGNOSIS — I495 Sick sinus syndrome: Secondary | ICD-10-CM | POA: Diagnosis present

## 2022-07-02 LAB — CBC
HCT: 29 % — ABNORMAL LOW (ref 36.0–46.0)
Hemoglobin: 9.1 g/dL — ABNORMAL LOW (ref 12.0–15.0)
MCH: 32.5 pg (ref 26.0–34.0)
MCHC: 31.4 g/dL (ref 30.0–36.0)
MCV: 103.6 fL — ABNORMAL HIGH (ref 80.0–100.0)
Platelets: 174 10*3/uL (ref 150–400)
RBC: 2.8 MIL/uL — ABNORMAL LOW (ref 3.87–5.11)
RDW: 13.4 % (ref 11.5–15.5)
WBC: 8.9 10*3/uL (ref 4.0–10.5)
nRBC: 0 % (ref 0.0–0.2)

## 2022-07-02 LAB — BASIC METABOLIC PANEL
Anion gap: 4 — ABNORMAL LOW (ref 5–15)
BUN: 20 mg/dL (ref 8–23)
CO2: 24 mmol/L (ref 22–32)
Calcium: 8 mg/dL — ABNORMAL LOW (ref 8.9–10.3)
Chloride: 107 mmol/L (ref 98–111)
Creatinine, Ser: 0.74 mg/dL (ref 0.44–1.00)
GFR, Estimated: >60 mL/min (ref >60)
Glucose, Bld: 102 mg/dL — ABNORMAL HIGH (ref 70–99)
Potassium: 3.9 mmol/L (ref 3.5–5.1)
Sodium: 135 mmol/L (ref 135–145)

## 2022-07-02 LAB — MAGNESIUM: Magnesium: 1.6 mg/dL — ABNORMAL LOW (ref 1.7–2.4)

## 2022-07-02 LAB — PROCALCITONIN: Procalcitonin: 1.51 ng/mL

## 2022-07-02 LAB — MRSA NEXT GEN BY PCR, NASAL: MRSA by PCR Next Gen: NOT DETECTED

## 2022-07-02 LAB — C-REACTIVE PROTEIN: CRP: 18.1 mg/dL — ABNORMAL HIGH (ref <1.0)

## 2022-07-02 MED ORDER — TRAMADOL HCL 50 MG PO TABS
50.0000 mg | ORAL_TABLET | Freq: Four times a day (QID) | ORAL | Status: DC | PRN
  Administered 2022-07-02 – 2022-07-06 (×5): 50 mg via ORAL
  Filled 2022-07-02 (×5): qty 1

## 2022-07-02 MED ORDER — VITAMIN B-6 100 MG PO TABS
500.0000 mg | ORAL_TABLET | Freq: Two times a day (BID) | ORAL | Status: DC
  Administered 2022-07-02 – 2022-07-06 (×7): 500 mg via ORAL
  Filled 2022-07-02 (×14): qty 5

## 2022-07-02 MED ORDER — VITAMIN B-12 1000 MCG PO TABS
1000.0000 ug | ORAL_TABLET | Freq: Every day | ORAL | Status: DC
  Administered 2022-07-02 – 2022-07-07 (×6): 1000 ug via ORAL
  Filled 2022-07-02 (×7): qty 1

## 2022-07-02 MED ORDER — GADOPICLENOL 0.5 MMOL/ML IV SOLN
7.5000 mL | Freq: Once | INTRAVENOUS | Status: AC | PRN
  Administered 2022-07-02: 5 mL via INTRAVENOUS

## 2022-07-02 MED ORDER — BENZONATATE 100 MG PO CAPS: 100.0000 mg | ORAL_CAPSULE | Freq: Three times a day (TID) | ORAL | Status: DC | PRN

## 2022-07-02 MED ORDER — METOCLOPRAMIDE HCL 5 MG/ML IJ SOLN
10.0000 mg | Freq: Once | INTRAMUSCULAR | Status: AC
  Administered 2022-07-02: 10 mg via INTRAVENOUS
  Filled 2022-07-02: qty 2

## 2022-07-02 MED ORDER — GABAPENTIN 300 MG PO CAPS
300.0000 mg | ORAL_CAPSULE | Freq: Two times a day (BID) | ORAL | Status: DC
  Administered 2022-07-02 – 2022-07-06 (×9): 300 mg via ORAL
  Filled 2022-07-02 (×10): qty 1

## 2022-07-02 MED ORDER — BENZONATATE 100 MG PO CAPS
100.0000 mg | ORAL_CAPSULE | Freq: Three times a day (TID) | ORAL | Status: DC
  Administered 2022-07-02 – 2022-07-08 (×17): 100 mg via ORAL
  Filled 2022-07-02 (×19): qty 1

## 2022-07-02 MED ORDER — GUAIFENESIN-DM 100-10 MG/5ML PO SYRP
10.0000 mL | ORAL_SOLUTION | ORAL | Status: DC | PRN
  Administered 2022-07-02 – 2022-07-08 (×6): 10 mL via ORAL
  Filled 2022-07-02 (×6): qty 10

## 2022-07-02 MED ORDER — MAGNESIUM SULFATE 4 GM/100ML IV SOLN
4.0000 g | Freq: Once | INTRAVENOUS | Status: AC
  Administered 2022-07-02: 4 g via INTRAVENOUS
  Filled 2022-07-02 (×2): qty 100

## 2022-07-02 MED ORDER — LORAZEPAM 2 MG/ML IJ SOLN: 1.0000 mg | Freq: Once | INTRAMUSCULAR | Status: DC | PRN

## 2022-07-02 NOTE — ED Notes (Signed)
This RN called [facility] and they stated that patient's pacemaker is MRI compatible. MRI called with information

## 2022-07-02 NOTE — Progress Notes (Signed)
PT Cancellation Note  Patient Details Name: Meghan Oliver MRN: [id number] DOB: 03/09/1941   Cancelled Treatment:    Reason Eval/Treat Not Completed: Patient at procedure or test/unavailable.  Two attempts to see, initially was having IV replaced then just left for MRI.  Retry as time and pt allow.   Ramond Dial 07/02/2022, 11:54 AM  Mee Hives, PT PhD Acute Rehab Dept. Number: [place] [facility] [place]

## 2022-07-02 NOTE — Progress Notes (Signed)
NEUROLOGY CONSULTATION PROGRESS NOTE   Date of service: July 02, 2022 Patient Name: Meghan Oliver MRN:  [id number] DOB:  04/06/1941  Brief HPI  Meghan Oliver is a 81 y.o. female with hx of HTN, HLD, hypothyroidism, OSA, GERD, sinus node dysfunction s/p PPM in 03/2018, bipolar and depression, migrainous headache when young but none in decades who presents with acute onset short lasting stabbing headaches. Atypical for temporal arteritis with no jaw claudication, no fever.  Interval history: Delirious and encephalopathic after meds for headaches. Unable to provide mush history. Seems comfortable.   Vitals:   07/02/22 0500 07/02/22 0600 07/02/22 0628 07/02/22 1018  BP: (!) 101/45 (!) 114/48    Pulse: 64 64    Resp: 20 18    Temp:   98.8 F (37.1 C) 98.3 F (36.8 C)  TempSrc:   Oral Oral  SpO2: 92% 93%    Weight:      Height:         Body mass index is 19.65 kg/m.  Physical Exam   General: reclining comfortably in bed; in no acute distress.  HENT: Normal oropharynx and mucosa. Normal external appearance of ears and nose.  Neck: Supple, no pain or tenderness  CV: No JVD. No peripheral edema.  Pulmonary: Symmetric Chest rise. Normal respiratory effort.  Abdomen: Soft to touch, non-tender.  Ext: No cyanosis, edema, or deformity  Skin: No rash. Normal palpation of skin.   Musculoskeletal: Normal digits and nails by inspection. No clubbing.   Neurologic Examination  Mental status/Cognition: somnolent, opens eye to gentle tactile stimulation and to her name. Falls back asleep in a few secs. Speech/language: mumbled her name Cranial nerves:   CN II Made eye contact on the right.   CN III,IV,VI No obvious gaze preference noted.   CN V    CN VII no asymmetry, no nasolabial fold flattening   CN VIII Turns head towards speech   CN IX & X    CN XI Head midline   CN XII    Motor:  Muscle bulk: poor, tone normal Moving all extremities spontaneously in bed.  Labs   Basic  Metabolic Panel:  Lab Results  Component Value Date   NA 135 07/02/2022   K 3.9 07/02/2022   CO2 24 07/02/2022   GLUCOSE 102 (H) 07/02/2022   BUN 20 07/02/2022   CREATININE 0.74 07/02/2022   CALCIUM 8.0 (L) 07/02/2022   GFRNONAA >60 07/02/2022   GFRAA 58 (L) 07/10/2020   HbA1c:  Lab Results  Component Value Date   HGBA1C 5.1 02/15/2011   LDL:  Lab Results  Component Value Date   LDLCALC 79 03/05/2022   Urine Drug Screen:     Component Value Date/Time   LABOPIA NONE DETECTED 07/13/2017 1610   COCAINSCRNUR NONE DETECTED 07/13/2017 1610   LABBENZ POSITIVE (A) 07/13/2017 1610   AMPHETMU NONE DETECTED 07/13/2017 1610   THCU NONE DETECTED 07/13/2017 1610   LABBARB POSITIVE (A) 07/13/2017 1610    Alcohol Level     Component Value Date/Time   ETH <10 07/13/2017 1431   No results found for: "PHENYTOIN", "ZONISAMIDE", "LAMOTRIGINE", "LEVETIRACETA" No results found for: "PHENYTOIN", "PHENOBARB", "VALPROATE", "CBMZ"  Imaging and Diagnostic studies  Results for orders placed during the hospital encounter of 07/01/22  CT Angio Head W or Wo Contrast 1. No evidence of large vessel occlusion or proximal hemodynamically significant stenosis. No aneurysm identified. 2. No evidence of dural venous sinus thrombosis.  MR Brain with and without contrast:  1. No acute intracranial abnormality. 2. Mild chronic small vessel ischemic disease.  CT venogram head: No dural sinus thrombosis.   Impression   Meghan Oliver is a 81 y.o. female with short lasting stabbing headaches which appear to most consistent with neuralgiform headaches. Atypical for temporal arteritis given no jaw claudication, no fever, no obvious temple tenderness, vision loss or vision blurring. However, ESR and CRP elevated(could be explained by Pneumonia?), asymmetric temporal pulses. Recommendations  - Temporal artery [name] pending - repeat ESR and CRP in AM. - If temporal artery [name] is non revealing and ESR and CRP  is not trending down despite Abx, will need to consult Vasc surgery for Temporal Artery Biopsy. - If she reports visual symptoms, recommend one time dose of IV solumedrol 1052m once - ANCA ordered and pending to evaluate for PMR. ______________________________________________________________________   Thank you for the opportunity to take part in the care of this patient. If you have any further questions, please contact the neurology consultation attending.  Signed,  SSpring ValleyPager Number [phone]

## 2022-07-02 NOTE — Progress Notes (Signed)
PROGRESS NOTE                                                                                                                                                                                                             Patient Demographics:    Meghan Oliver, is a 81 y.o. female, DOB - May 12, 1941, VHQ:[id number]  Outpatient Primary MD for the patient is Baxley, Cresenciano Lick, MD    LOS - 0  Admit date - 07/01/2022    Chief Complaint  Patient presents with   Headache       Brief Narrative (HPI from H&P)   81 y.o. female with medical history significant of HTN, HLD, hypothyroidism, OSA, GERD, sinus node dysfunction s/p PPM in 03/2018, bipolar and depression who presented to ED with 3 day history of cough, chills and a severe headache. New intermittent episodes of headache on left temple x 3 days prior to hospitalization of note she had similar headaches about 2 months ago which lasted for a few days.  No photophobia, no jaw claudication, no problems chewing food.  No temporal tenderness.  In the hospital she was diagnosed with pneumonia and admitted to the hospital.  Neurology was consulted for headaches.   Subjective:    Meghan Oliver today has, No headache, No chest pain, No abdominal pain - No Nausea, No new weakness tingling or numbness, +ve cough and some SOB.   Assessment  & Plan :    Community acquired pneumonia - she has been started on appropriate antibiotics, has intermittent delirium and a very weak cough reflex increasing the chances of aspiration, will involve speech therapy, continue supportive care with oxygen, I-S and flutter valve for pulmonary toiletry.  Supportive care for cough.  Follow cultures closely.  Pneumonia could explain her elevated ESR and CRP.  Intermittent episodes of left temporal headache - 3 day history of intermittent left sided temporal headache with some radiation to right side. Non tender on exam, no  pain with chewing, no phonophobia/photophobia.  This happened 2 months ago and lasted for a few days, ESR is high, CT scan head, CT angiogram head and neck and CT venogram nonacute.  Temporal artery ultrasound and MRI brain if pacemaker is compatible ordered.  Case discussed with neurologist Dr. Leonel Ramsay, withhold steroids, monitor.  Not typical features of giant cell arteritis per neurology.  They will follow closely.  Elevated troponin - She has no chest pain or ekg changes, troponin trend flat and not an ACS pattern, continue aspirin and statin for secondary prevention Case discussed with her cardiologist Dr. Loralie Champagne, no further work-up for now.  Macrocytic anemia - Hb nadir of 9.7 after hip surgery in August, iron studies inconclusive B12 stable, continue to monitor no signs of infection.  Some drop due to hemodilution.  Continue PPI twice daily.  Bipolar/depression - History of ECT x 30 in past, Continue home medication of paxil, pamelor, remeron and ativan PRN and lithium, stable lithium level.  Sinus node dysfunction (HCC) - S/p [employer] PPM in 03/2018.   Memory loss - Continue Namenda, at risk for delirium and aspiration.  Discussed with her primary nurse and husband on 07/02/2022.  Hyperlipidemia - Continue Crestor.   Hypothyroidism - TSH wnl in 03/2022, Continue home synthroid.   Chronic constipation - Continue home senokot, linzess, miralaz prn, also has movik prn.   GERD - Continue PPI.   OSA (obstructive sleep apnea) - Not on cpap, non compliant    Hypomagnesemia.  Replaced.       Condition - Extremely Guarded  Family Communication  :    Updated personal nurse at bedside in detail on 07/02/2022.  Updated spouse Meghan Oliver [phone] on 07/02/2022  Code Status :  Full  Consults  :  Neuro  PUD Prophylaxis : PPI   Procedures  :     MRI  [name] Temp Artery   CT scan head, CT angiogram head and neck and CT venogram nonacute      Disposition Plan  :     Status is: Observation  DVT Prophylaxis  :    enoxaparin (LOVENOX) injection 30 mg Start: 07/01/22 1915   Lab Results  Component Value Date   PLT 174 07/02/2022    Diet :  Diet Order             Diet regular Room service appropriate? Yes; Fluid consistency: Thin  Diet effective now                    Inpatient Medications  Scheduled Meds:  aspirin  81 mg Oral Daily   benzonatate  100 mg Oral TID   cyanocobalamin  1,000 mcg Oral QHS   enoxaparin (LOVENOX) injection  30 mg Subcutaneous Q24H   fesoterodine  8 mg Oral Daily   gabapentin  300 mg Oral BID   levothyroxine  75 mcg Oral Q0600   linaclotide  72 mcg Oral BID   lithium carbonate  300 mg Oral QHS   memantine  10 mg Oral BID   mirtazapine  7.5 mg Oral QHS   nortriptyline  75 mg Oral QHS   pantoprazole  40 mg Oral BID AC   PARoxetine  20 mg Oral QHS   primidone  100 mg Oral q AM   primidone  50 mg Oral QHS   vitamin B-6  500 mg Oral BID   rosuvastatin  40 mg Oral Daily   senna-docusate  2 tablet Oral BID   Continuous Infusions:  sodium chloride 75 mL/hr at 07/02/22 0832   azithromycin (ZITHROMAX) 500 mg in sodium chloride 0.9 % 250 mL IVPB     cefTRIAXone (ROCEPHIN)  IV     magnesium sulfate bolus IVPB     PRN Meds:.acetaminophen **OR** acetaminophen, albuterol, guaiFENesin-dextromethorphan, LORazepam, LORazepam, naloxegol oxalate, ondansetron (ZOFRAN) IV, polyethylene glycol, traMADol  Antibiotics  :  Anti-infectives (From admission, onward)    Start     Dose/Rate Route Frequency Ordered Stop   07/02/22 1000  azithromycin (ZITHROMAX) 500 mg in sodium chloride 0.9 % 250 mL IVPB        500 mg 250 mL/hr over 60 Minutes Intravenous Daily 07/01/22 2352     07/02/22 1000  cefTRIAXone (ROCEPHIN) 1 g in sodium chloride 0.9 % 100 mL IVPB        1 g 200 mL/hr over 30 Minutes Intravenous Daily 07/01/22 2352     07/01/22 1130  cefTRIAXone (ROCEPHIN) 1 g in sodium chloride 0.9 % 100 mL IVPB        1  g 200 mL/hr over 30 Minutes Intravenous  Once 07/01/22 1121 07/01/22 1348   07/01/22 1130  azithromycin (ZITHROMAX) 500 mg in sodium chloride 0.9 % 250 mL IVPB        500 mg 250 mL/hr over 60 Minutes Intravenous  Once 07/01/22 1121 07/01/22 1348        Time Spent in minutes  30   Lala Lund M.D on 07/02/2022 at 8:52 AM  To page go to [email or web address]   [facility] -  Office  [phone]  See all Orders from today for further details    Objective:   Vitals:   07/02/22 0400 07/02/22 0500 07/02/22 0600 07/02/22 0628  BP: (!) 106/48 (!) 101/45 (!) 114/48   Pulse: 64 64 64   Resp: _0 Temp:    98.8 F (37.1 C)  TempSrc:    Oral  SpO2: 93% 92% 93%   Weight:      Height:        Wt Readings from Last 3 Encounters:  07/01/22 42.6 kg  05/18/22 42.6 kg  03/31/22 44.7 kg     Intake/Output Summary (Last 24 hours) at 07/02/2022 0852 Last data filed at 07/01/2022 1348 Gross per 24 hour  Intake 350.51 ml  Output --  Net 350.51 ml     Physical Exam  Awake pleasantly confused, no focal deficits, [place].AT,PERRAL Supple Neck, No JVD,   Symmetrical Chest wall movement, Good air movement bilaterally, coarse bilateral breath sounds RRR,No Gallops,Rubs or new Murmurs,  +ve B.Sounds, Abd Soft, No tenderness,   No Cyanosis, Clubbing or edema      Data Review:    CBC Recent Labs  Lab 07/01/22 1140 07/02/22 0737  WBC 10.8* 8.9  HGB 10.1* 9.1*  HCT 32.3* 29.0*  PLT 179 174  MCV 103.2* 103.6*  MCH 32.3 32.5  MCHC 31.3 31.4  RDW 13.4 13.4  LYMPHSABS 0.8  --   MONOABS 0.6  --   EOSABS 0.1  --   BASOSABS 0.0  --     Electrolytes Recent Labs  Lab 07/01/22 1140 07/01/22 1618 07/01/22 2052 07/02/22 0737  NA 136  --   --  135  K 4.2  --   --  3.9  CL 105  --   --  107  CO2 22  --   --  24  GLUCOSE 86  --   --  102*  BUN 24*  --   --  20  CREATININE 0.74  --   --  0.74  CALCIUM 9.0  --   --  8.0*  MG  --   --   --  1.6*  CRP  --  18.3*  --   --    PROCALCITON  --   --  2.05  --  BNP  --   --  206.2*  --     ------------------------------------------------------------------------------------------------------------------ No results for input(s): "CHOL", "HDL", "LDLCALC", "TRIG", "CHOLHDL", "LDLDIRECT" in the last 72 hours.  Lab Results  Component Value Date   HGBA1C 5.1 02/15/2011    No results for input(s): "TSH", "T4TOTAL", "T3FREE", "THYROIDAB" in the last 72 hours.  Invalid input(s): "FREET3" ------------------------------------------------------------------------------------------------------------------ ID Labs Recent Labs  Lab 07/01/22 1140 07/01/22 1618 07/01/22 2052 07/02/22 0737  WBC 10.8*  --   --  8.9  PLT 179  --   --  174  CRP  --  18.3*  --   --   PROCALCITON  --   --  2.05  --   CREATININE 0.74  --   --  0.74   Radiology Reports DG Chest Port 1 View  Result Date: 07/02/2022 CLINICAL DATA:  Headache, cough, pneumonia. EXAM: PORTABLE CHEST 1 VIEW COMPARISON:  07/01/2022 FINDINGS: There is a left chest wall pacer device with lead in the right atrial appendage and right ventricle. Normal heart size. Aortic atherosclerotic calcifications. Persistent airspace disease within the right upper lobe is not significantly changed from previous exam. IMPRESSION: Persistent right upper lobe airspace disease compatible with pneumonia. Electronically Signed   By: Kerby Moors M.D.   On: 07/02/2022 06:28   CT VENOGRAM HEAD  Result Date: 07/01/2022 CLINICAL DATA:  Headache, classic migraine headache; Headache, tension-type EXAM: CT ANGIOGRAPHY HEAD CT VENOGRAM HEAD TECHNIQUE: Multidetector CT imaging of the head was performed using the standard protocol during bolus administration of intravenous contrast. Venographic phase images of the brain were obtained following the administration of intravenous contrast. Multiplanar reformats and maximum intensity projections were generated. RADIATION DOSE REDUCTION: This exam was  performed according to the departmental dose-optimization program which includes automated exposure control, adjustment of the mA and/or kV according to patient size and/or use of iterative reconstruction technique. CONTRAST:  58m OMNIPAQUE IOHEXOL 350 MG/ML SOLN COMPARISON:  Same day CT head. FINDINGS: CTA: Anterior circulation: Bilateral intracranial ICAs, MCAs, and ACAs are patent without proximal hemodynamically significant stenosis. No aneurysm identified. Posterior circulation: Left intradural vertebral artery is non dominant and terminates as PICA, anatomic variant. Right intradural vertebral artery, basilar artery and bilateral posterior cerebral arteries are patent without proximal hemodynamically significant stenosis. No aneurysm identified CTV: No evidence of dural venous sinus thrombosis. The superior sagittal, transverse and sigmoid sinuses are patent. Narrowing of the left transverse sinus. The straight sinus and visualized deep cerebral veins are patent IMPRESSION: 1. No evidence of large vessel occlusion or proximal hemodynamically significant stenosis. No aneurysm identified. 2. No evidence of dural venous sinus thrombosis. Electronically Signed   By: FMargaretha SheffieldM.D.   On: 07/01/2022 15:04   CT Angio Head W or Wo Contrast  Result Date: 07/01/2022 CLINICAL DATA:  Headache, classic migraine headache; Headache, tension-type EXAM: CT ANGIOGRAPHY HEAD CT VENOGRAM HEAD TECHNIQUE: Multidetector CT imaging of the head was performed using the standard protocol during bolus administration of intravenous contrast. Venographic phase images of the brain were obtained following the administration of intravenous contrast. Multiplanar reformats and maximum intensity projections were generated. RADIATION DOSE REDUCTION: This exam was performed according to the departmental dose-optimization program which includes automated exposure control, adjustment of the mA and/or kV according to patient size and/or  use of iterative reconstruction technique. CONTRAST:  755mOMNIPAQUE IOHEXOL 350 MG/ML SOLN COMPARISON:  Same day CT head. FINDINGS: CTA: Anterior circulation: Bilateral intracranial ICAs, MCAs, and ACAs are patent without proximal hemodynamically significant stenosis.  No aneurysm identified. Posterior circulation: Left intradural vertebral artery is non dominant and terminates as PICA, anatomic variant. Right intradural vertebral artery, basilar artery and bilateral posterior cerebral arteries are patent without proximal hemodynamically significant stenosis. No aneurysm identified CTV: No evidence of dural venous sinus thrombosis. The superior sagittal, transverse and sigmoid sinuses are patent. Narrowing of the left transverse sinus. The straight sinus and visualized deep cerebral veins are patent IMPRESSION: 1. No evidence of large vessel occlusion or proximal hemodynamically significant stenosis. No aneurysm identified. 2. No evidence of dural venous sinus thrombosis. Electronically Signed   By: Margaretha Sheffield M.D.   On: 07/01/2022 15:04   CT Head Wo Contrast  Result Date: 07/01/2022 CLINICAL DATA:  Left-sided headache.  Cough. EXAM: CT HEAD WITHOUT CONTRAST TECHNIQUE: Contiguous axial images were obtained from the base of the skull through the vertex without intravenous contrast. RADIATION DOSE REDUCTION: This exam was performed according to the departmental dose-optimization program which includes automated exposure control, adjustment of the mA and/or kV according to patient size and/or use of iterative reconstruction technique. COMPARISON:  05/07/2016. FINDINGS: Brain: No evidence of acute infarction, hemorrhage, hydrocephalus, extra-axial collection or mass lesion/mass effect. Vascular: No hyperdense vessel or unexpected calcification. Skull: Normal. Negative for fracture or focal lesion. Sinuses/Orbits: Visualized globes and orbits are unremarkable. The visualized sinuses are clear. Other: None.  IMPRESSION: Normal unenhanced CT scan of the brain. Electronically Signed   By: Lajean Manes M.D.   On: 07/01/2022 12:28   DG Chest 2 View  Result Date: 07/01/2022 CLINICAL DATA:  Headache, cough, malaise, hypertension EXAM: CHEST - 2 VIEW COMPARISON:  06/12/2018 FINDINGS: Normal heart size, mediastinal contours, and pulmonary vascularity. Atherosclerotic calcification aorta. LEFT subclavian sequential transvenous pacemaker leads project at RIGHT atrium and RIGHT ventricle. Patchy infiltrates identified greatest RIGHT upper lobe consistent with pneumonia. No pleural effusion or pneumothorax. BILATERAL breast prostheses. Bones demineralized. IMPRESSION: RIGHT upper lobe infiltrate consistent with pneumonia. Aortic Atherosclerosis (ICD10-I70.0). Electronically Signed   By: Lavonia Dana M.D.   On: 07/01/2022 10:09

## 2022-07-02 NOTE — Evaluation (Signed)
Clinical/Bedside Swallow Evaluation Patient Details  Name: Meghan Oliver MRN: [id number] Date of Birth: December 30, 1940  Today's Date: 07/02/2022 Time: SLP Start Time (ACUTE ONLY): 63 SLP Stop Time (ACUTE ONLY): 1104 SLP Time Calculation (min) (ACUTE ONLY): 9 min  Past Medical History:  Past Medical History:  Diagnosis Date   Anxiety    Arthritis    "hips, spine" (03/17/2018)   Bipolar II disorder (HCC)    CHF (congestive heart failure) (HCC)    Chronic bronchitis (HCC)    Chronic lower back pain    Chronic right hip pain    CKD (chronic kidney disease), stage II    Coronary artery disease    stent x1   Esophagitis, erosive    GAD (generalized anxiety disorder)    GERD (gastroesophageal reflux disease)    Headache    "maybe monthly" (03/17/2018))   Heart murmur, systolic    History of adenomatous polyp of colon    08-04-2016  tubular adenoma   History of blood transfusion 12/2017   "related to vascular hematoma"   History of electroconvulsive therapy    at [place]--  started 04-15-2015 to 11-17-2016  total greater than 40 times   History of hiatal hernia    Hyperlipidemia    Hypertension    Hypothyroidism    Internal carotid artery stenosis, bilateral    per last duplex 05-01-2014  bilateral ICA 40-59%   Major depression, chronic    ECT treatments extensive and multiple started 07/ 2016   Memory loss    "both short and long-term; needs frequent reminders to follow instrucitons" (05/16/2017)   Migraines    "none in years" (03/17/2018)   OSA (obstructive sleep apnea)    per study 06/ 2012 moderate OSA  ; "refuses to wear masks" (03/17/2018)   Osteoporosis    Poor historian    due to short term memory loss   Presence of permanent cardiac pacemaker 03/17/2018   Pulmonary nodule    monitored by pcp   S/P placement of cardiac pacemaker 03/17/18 [facility]  03/18/2018   Short-term memory loss    Sick sinus syndrome ([place])    Past Surgical History:  Past Surgical History:   Procedure Laterality Date   APPENDECTOMY  1971   AUGMENTATION MAMMAPLASTY Bilateral    CARDIOVASCULAR STRESS TEST  01-30-2015  dr Aundra Dubin   Low risk nuclear study w/ no evidence ischemia or infarction/  normal LV funciton and wall motion , 76%   COLONOSCOPY W/ BIOPSIES AND POLYPECTOMY  "multiple"   COLONOSCOPY WITH ESOPHAGOGASTRODUODENOSCOPY (EGD)  last one 08-04-2016   CORONARY ANGIOPLASTY WITH STENT PLACEMENT  05/16/2017   "LAD"   CORONARY STENT INTERVENTION N/A 05/16/2017   Procedure: CORONARY STENT INTERVENTION;  Surgeon: Burnell Blanks, MD;  Location: [facility];  Service: Cardiovascular;  Laterality: N/A;   ESOPHAGOGASTRODUODENOSCOPY  02-26-04   ESOPHAGOGASTRODUODENOSCOPY  "multiple"   INSERT / REPLACE / REMOVE PACEMAKER  03/17/2018   LEFT HEART CATH AND CORONARY ANGIOGRAPHY N/A 05/16/2017   Procedure: LEFT HEART CATH AND CORONARY ANGIOGRAPHY;  Surgeon: Larey Dresser, MD;  Location: [facility];  Service: Cardiovascular;  Laterality: N/A;   OVARIAN CYST SURGERY  1970s   Laparotomy    PACEMAKER IMPLANT N/A 03/17/2018   Procedure: PACEMAKER IMPLANT;  Surgeon: Evans Lance, MD;  Location: [facility];  Service: Cardiovascular;  Laterality: N/A;   PORT-A-CATH PLACEMENT  05/31/2016; 2018   "@ [facility]; for ECT series"; "@ [facility] also"  PORT-A-CATH REMOVAL N/A 03/11/2017   Procedure: REMOVAL PORT-A-CATH;  Surgeon: Jackolyn Confer, MD;  Location: [facility];  Service: General;  Laterality: N/A;   PORTA CATH REMOVAL  03/17/2018   PORTA CATH REMOVAL  03/17/2018   Procedure: PORTA CATH REMOVAL;  Surgeon: Evans Lance, MD;  Location: [place] [facility];  Service: Cardiovascular;;   TOTAL HIP ARTHROPLASTY Right 05/18/2022   Procedure: TOTAL HIP ARTHROPLASTY ANTERIOR APPROACH;  Surgeon: Paralee Cancel, MD;  Location: [facility];  Service: Orthopedics;  Laterality: Right;   TRANSTHORACIC ECHOCARDIOGRAM  09/05/2013  dr Aundra Dubin   mild LVH, ef 65-70%, grade 1  diastolic dysfunction/  very mild AV stenosis with mild AR/  trivial MR and PT/ mild to moderate LAE/ mild TR/ mild pulmonary hypertension with PA peak pressure 64mHg   TUBAL LIGATION     HPI:  81y.o. female who presented to ED with 3 day history of cough, chills and a severe headache. New intermittent episodes of headache on left temple x 3 days prior to hospitalization of note she had similar headaches about 2 months ago which lasted for a few days.  In the hospital she was diagnosed with pneumonia and admitted to the hospital.  Neurology was consulted for headaches. Pt with medical history significant of HTN, HLD, hypothyroidism, OSA, GERD, sinus node dysfunction s/p PPM in 03/2018, bipolar and depression. Caregiver reports hx esophageal dilation x2.    Assessment / Plan / Recommendation  Clinical Impression  Pt presents with concerns for prandial aspiration given recent pneumonia.  Pt with frequent baseline cough in presence and absence of PO trials.  Cough was dry in nature.  Pt exhibited good oral clearance of solids.  Recommend MBSS to rule out aspiration as contributing factor to pna. Caregiver present and reports that episode of pill dysphagia overnight may have been 2/2 adminstration of tramadol which made pt lethargic, but indicates that pt is behaving more like herself now.  Pt has past hx of esophageal dilation x2 (husband is retired GI MD), but no prior symptoms of pharyngeal dysphagia.   Recommend continuing regular texture diet with thin liquid pending results of instrumental assessment.  SLP Visit Diagnosis: Dysphagia, unspecified (R13.10)    Aspiration Risk  Mild aspiration risk    Diet Recommendation Regular;Thin liquid   Liquid Administration via: Cup;Straw Medication Administration: Whole meds with liquid Supervision: Patient able to self feed Compensations: Slow rate;Small sips/bites Postural Changes: Seated upright at 90 degrees;Remain upright for at least 30 minutes  after po intake    Other  Recommendations Oral Care Recommendations: Oral care BID    Recommendations for follow up therapy are one component of a multi-disciplinary discharge planning process, led by the attending physician.  Recommendations may be updated based on patient status, additional functional criteria and insurance authorization.  Follow up Recommendations  (pending results of MBSS)      Assistance Recommended at Discharge    Functional Status Assessment  (pending results of MBSS)  Frequency and Duration  (pending results of MBSS)          Prognosis Prognosis for Safe Diet Advancement:  (pending results of MBSS)      Swallow Study   General Date of Onset: 07/01/22 HPI: 81y.o. female who presented to ED with 3 day history of cough, chills and a severe headache. New intermittent episodes of headache on left temple x 3 days prior to hospitalization of note she had similar headaches about 2 months ago which lasted for  a few days.  In the hospital she was diagnosed with pneumonia and admitted to the hospital.  Neurology was consulted for headaches. Pt with medical history significant of HTN, HLD, hypothyroidism, OSA, GERD, sinus node dysfunction s/p PPM in 03/2018, bipolar and depression. Caregiver reports hx esophageal dilation x2. Type of Study: Bedside Swallow Evaluation Previous Swallow Assessment: none Diet Prior to this Study: Regular;Thin liquids Temperature Spikes Noted: No History of Recent Intubation: No Behavior/Cognition: Alert;Cooperative;Pleasant mood Oral Cavity Assessment: Within Functional Limits Oral Care Completed by SLP: No Oral Cavity - Dentition: Adequate natural dentition Vision: Functional for self-feeding Self-Feeding Abilities: Able to feed self Patient Positioning: Upright in bed Baseline Vocal Quality: Normal Volitional Cough: Strong Volitional Swallow: Able to elicit    Oral/Motor/Sensory Function Overall Oral Motor/Sensory Function: Within  functional limits Facial ROM: Within Functional Limits Facial Symmetry: Within Functional Limits Lingual ROM: Within Functional Limits Lingual Symmetry: Within Functional Limits Lingual Strength: Reduced Velum: Within Functional Limits Mandible: Within Functional Limits   Ice Chips Ice chips: Not tested   Thin Liquid Thin Liquid: Not tested    Nectar Thick Nectar Thick Liquid: Not tested   Honey Thick Honey Thick Liquid: Not tested   Puree Puree: Within functional limits   Solid     Solid: Within functional limits      Celedonio Savage, [place], [place] Office: [phone] 07/02/2022,11:57 AM

## 2022-07-02 NOTE — ED Notes (Signed)
Patient being transported to MRI

## 2022-07-02 NOTE — Consult Note (Signed)
Neurology Consultation Reason for Consult: Temporal headache Referring Physician: Eliberto Ivory, a  CC: Headache  History is obtained from: Patient  HPI: Meghan Oliver is a 81 y.o. female who has been complaining of cough and headache going on since Monday.  She states that both symptoms started about the same time.  She describes the headache as intermittent, stabbing in character, lasting for only a few seconds at a time and then going away.  Due to these headaches she sought care at [facility] [place] where she was also found to have a pneumonia.  ESR was 70 and CRP was 18.3.  She denies any symptoms of polymyalgia rheumatica, including neck pain or stiffness.  She denies any jaw claudication.  She denies any visual change.   She had a similar episode about 3 weeks ago, but this was not as long-lasting.  She does not have a recent history of headaches, though when she was younger woman she did have migraines.  Past Medical History:  Diagnosis Date   Anxiety    Arthritis    "hips, spine" (03/17/2018)   Bipolar II disorder (HCC)    CHF (congestive heart failure) (HCC)    Chronic bronchitis (HCC)    Chronic lower back pain    Chronic right hip pain    CKD (chronic kidney disease), stage II    Coronary artery disease    stent x1   Esophagitis, erosive    GAD (generalized anxiety disorder)    GERD (gastroesophageal reflux disease)    Headache    "maybe monthly" (03/17/2018))   Heart murmur, systolic    History of adenomatous polyp of colon    08-04-2016  tubular adenoma   History of blood transfusion 12/2017   "related to vascular hematoma"   History of electroconvulsive therapy    at [place]--  started 04-15-2015 to 11-17-2016  total greater than 40 times   History of hiatal hernia    Hyperlipidemia    Hypertension    Hypothyroidism    Internal carotid artery stenosis, bilateral    per last duplex 05-01-2014  bilateral ICA 40-59%   Major depression, chronic    ECT treatments  extensive and multiple started 07/ 2016   Memory loss    "both short and long-term; needs frequent reminders to follow instrucitons" (05/16/2017)   Migraines    "none in years" (03/17/2018)   OSA (obstructive sleep apnea)    per study 06/ 2012 moderate OSA  ; "refuses to wear masks" (03/17/2018)   Osteoporosis    Poor historian    due to short term memory loss   Presence of permanent cardiac pacemaker 03/17/2018   Pulmonary nodule    monitored by pcp   S/P placement of cardiac pacemaker 03/17/18 [facility]  03/18/2018   Short-term memory loss    Sick sinus syndrome ([place])      Family History  Problem Relation Age of Onset   Heart attack Father 72       deceased   Hypertension Father    Heart disease Father    Breast cancer Paternal Grandmother        Age unknown   Breast cancer Paternal Aunt        Age 59's   Colon cancer Neg Hx      Social History:  reports that she quit smoking about 51 years ago. Her smoking use included cigarettes. She has a 30.00 pack-year smoking history. She has never used smokeless tobacco. She reports current  alcohol use. She reports that she does not use drugs.   Exam: Current vital signs: BP (!) 103/45   Pulse 63   Temp 99.2 F (37.3 C) (Oral)   Resp (!) 21   Ht 4' 10" (1.473 m)   Wt 42.6 kg   LMP  (LMP Unknown)   SpO2 99%   BMI 19.65 kg/m  Vital signs in last 24 hours: Temp:  [97.6 F (36.4 C)-99.2 F (37.3 C)] 99.2 F (37.3 C) (09/29 0245) Pulse Rate:  [49-85] 63 (09/29 0300) Resp:  [16-25] 21 (09/29 0300) BP: (96-131)/(42-94) 103/45 (09/29 0300) SpO2:  [88 %-99 %] 99 % (09/29 0300) Weight:  [42.6 kg] 42.6 kg (09/28 0934)   Physical Exam  Constitutional: Appears well-developed and well-nourished.   Of note, she has asymmetric temporal pulses with left being less prominent.  Neuro: Mental Status: Patient is awake, alert, oriented to person, month, year, and situation.  She is able to tell me she is in a hospital but not which  one. Patient is able to give a clear and coherent history. No signs of aphasia or neglect Cranial Nerves: II: Visual Fields are full. Pupils are equal, round, and reactive to light.   III,IV, VI: EOMI without ptosis or diploplia.  V: Facial sensation is symmetric to temperature VII: Facial movement is symmetric.  VIII: hearing is intact to voice X: Uvula elevates symmetrically XI: Shoulder shrug is symmetric. XII: tongue is midline without atrophy or fasciculations.  Motor: Tone is normal. Bulk is normal. 5/5 strength was present in all four extremities.  Sensory: Sensation is symmetric to light touch and temperature in the arms and legs.Cerebellar: FNF intact bilaterally      I have reviewed labs in epic and the results pertinent to this consultation are: ESR 70 CRP 18 Mild leukocytosis at 10.8 Procalcitonin 2.0  I have reviewed the images obtained: CT head-NAIC, CTA/V-negative   impression: 81 year old female with new onset short lasting stabbing headaches.  She is relatively pain-free interictally.  This character of headache would be very unusual for temporal arteritis, but given the asymmetric temporal pulses, I do think further evaluation is needed.  The ESR and CRP are of markedly unclear significance given she has an active pneumonia.  I am hesitant to start immunosuppression based on her current presentation, but I do think a temporal artery ultrasound looking for halo sign would be prudent.  Though the character is also atypical for migraine, given her history I do think a dose of Reglan to see if it improves things would be beneficial.  If negative, could consider treatment such as gabapentin or Lyrica given the neuralgiform character  Recommendations: 1) temporal artery ultrasound 2) Reglan 10 mg x 1 3) neurology will follow   Roland Rack, MD [facility] [phone]  If 7pm- 7am, please page neurology on call as listed in [place].

## 2022-07-02 NOTE — Progress Notes (Signed)
Temporal artery duplex has been completed.   Preliminary results in CV Proc.   Yeva Bissette Emmilia Sowder 07/02/2022 10:01 AM

## 2022-07-02 NOTE — ED Notes (Signed)
Speech consulted noted. Patient having difficulty swallowing pills at this time.

## 2022-07-02 NOTE — Evaluation (Signed)
Occupational Therapy Evaluation Patient Details Name: Meghan Oliver MRN: [id number] DOB: 08-16-1941 Today's Date: 07/02/2022   History of Present Illness Pt is an 81 year old female presented to [facility] with 3 day history of cough, chills and a severe headache.  Right THA on 05/18/22.  PMHx significant for but not limited to: memory loss, bipolar II, PPM, CHF, chronic low back pain, HTN   Clinical Impression   Patient admitted for the diagnosis above.  PTA she lives at home with her spouse, a retired GI MD.  She had an Anterior R THA about 6 weeks ago, and recently transitioned from a RW to a [place].  The patient did not need any assist with ADL at home, and has all the needed DME.  Currently she is expressing intermittent headaches, and presents with confusion and is disoriented.  She does have baseline ST Memory deficits.  OT will follow in the acute setting, but no post acute OT is anticipated.         Recommendations for follow up therapy are one component of a multi-disciplinary discharge planning process, led by the attending physician.  Recommendations may be updated based on patient status, additional functional criteria and insurance authorization.   Follow Up Recommendations  No OT follow up    Assistance Recommended at Discharge Intermittent Supervision/Assistance  Patient can return home with the following Assist for transportation;Direct supervision/assist for medications management;A little help with walking and/or transfers    Functional Status Assessment  Patient has had a recent decline in their functional status and demonstrates the ability to make significant improvements in function in a reasonable and predictable amount of time.  Equipment Recommendations  None recommended by OT    Recommendations for Other Services       Precautions / Restrictions Precautions Precautions: Fall Restrictions Weight Bearing Restrictions: No      Mobility Bed Mobility Overal bed  mobility: Needs Assistance Bed Mobility: Supine to Sit, Sit to Supine     Supine to sit: Supervision Sit to supine: Supervision        Transfers Overall transfer level: Needs assistance Equipment used: 1 person hand held assist Transfers: Sit to/from Stand Sit to Stand: Supervision, Min guard                  Balance Overall balance assessment: Needs assistance Sitting-balance support: Feet unsupported, Bilateral upper extremity supported Sitting balance-Leahy Scale: Fair     Standing balance support: Single extremity supported Standing balance-Leahy Scale: Poor                             ADL either performed or assessed with clinical judgement   ADL       Grooming: Wash/dry hands;Wash/dry face;Min guard;Standing               Lower Body Dressing: Min guard;Minimal assistance;Sit to/from stand   Toilet Transfer: Min Dentist;Ambulation                   Vision Patient Visual Report: No change from baseline       Perception Perception Perception: Within Functional Limits   Praxis Praxis Praxis: Intact    Pertinent Vitals/Pain Pain Assessment Faces Pain Scale: Hurts even more Pain Location: headache, intermittently Pain Descriptors / Indicators: Headache Pain Intervention(s): Monitored during session     Hand Dominance Right   Extremity/Trunk Assessment Upper Extremity Assessment Upper Extremity Assessment: Overall WFL for  tasks assessed   Lower Extremity Assessment Lower Extremity Assessment: Defer to PT evaluation   Cervical / Trunk Assessment Cervical / Trunk Assessment: Normal   Communication Communication Communication: No difficulties   Cognition Arousal/Alertness: Awake/alert Behavior During Therapy: Restless, Anxious, Impulsive Overall Cognitive Status: Impaired/Different from baseline Area of Impairment: Orientation, Attention, Memory, Following commands, Awareness, Problem  solving                 Orientation Level: Person, Time Current Attention Level: Sustained Memory: Decreased short-term memory Following Commands: Follows one step commands with increased time   Awareness: Intellectual Problem Solving: Slow processing, Difficulty sequencing, Requires verbal cues       General Comments   VSS    Exercises     Shoulder Instructions      Home Living Family/patient expects to be discharged to:: Private residence Living Arrangements: Spouse/significant other Available Help at Discharge: Other (Comment) Social worker that assists with HEP) Type of Home: House Home Access: Level entry     Home Layout: One level     Bathroom Shower/Tub: Occupational psychologist: Standard Bathroom Accessibility: Yes How Accessible: Accessible via walker Home Equipment: [place] (2 wheels);Cane - single point;Hand held shower head;Grab bars - tub/shower;Shower seat          Prior Functioning/Environment Prior Level of Function : Independent/Modified Independent             Mobility Comments: had recently progressed from [place] to [facility] ADLs Comments: No assist with ADL.  Spouse handles community mobility, finances.  They have someone for home management.        OT Problem List: Impaired balance (sitting and/or standing);Pain;Decreased cognition;Decreased safety awareness      OT Treatment/Interventions: Self-care/ADL training;Therapeutic activities;Cognitive remediation/compensation;Patient/family education;Balance training    OT Goals(Current goals can be found in the care plan section) Acute Rehab OT Goals Patient Stated Goal: Go home OT Goal Formulation: With patient Time For Goal Achievement: 07/16/22 Potential to Achieve Goals: Good ADL Goals Pt Will Perform Grooming: Independently;standing Pt Will Perform Lower Body Dressing: Independently;sit to/from stand Pt Will Transfer to Toilet: Independently;ambulating;regular height  toilet  OT Frequency: Min 2X/week    Co-evaluation              AM-PAC OT "6 Clicks" Daily Activity     Outcome Measure Help from another person eating meals?: None Help from another person taking care of personal grooming?: A Little Help from another person toileting, which includes using toliet, bedpan, or urinal?: A Little Help from another person bathing (including washing, rinsing, drying)?: A Little Help from another person to put on and taking off regular upper body clothing?: None Help from another person to put on and taking off regular lower body clothing?: A Little 6 Click Score: 20   End of Session Equipment Utilized During Treatment: Gait belt Nurse Communication: Mobility status  Activity Tolerance: Patient tolerated treatment well Patient left: in bed;with call bell/phone within reach;with family/visitor present  OT Visit Diagnosis: Unsteadiness on feet (R26.81);Other symptoms and signs involving cognitive function                Time: 8841-6606 OT Time Calculation (min): 21 min Charges:  OT General Charges $OT Visit: 1 Visit OT Evaluation $OT Eval Moderate Complexity: 1 Mod  07/02/2022  RP, OTR/L  Acute Rehabilitation Services  Office:  [phone]   Metta Clines 07/02/2022, 4:40 PM

## 2022-07-02 NOTE — Progress Notes (Signed)
Modified Barium Swallow Progress Note  Patient Details  Name: Meghan Oliver MRN: [id number] Date of Birth: 1940-11-18  Today's Date: 07/02/2022  Modified Barium Swallow completed.  Full report located under Chart Review in the Imaging Section.  Brief recommendations include the following:  Clinical Impression  Pt has generally functional oropharyngeal swallowing, but she does appear to have a prominent CP that might partially interfere with bolus flow but leaving only trace residue within the UES or just above. No aspiration occurs and coughing throughout MBS is not related to airway protection. Of note, the barium tablet stayed briefly in the distal esophagus but did appear to clear with an additional liquid wash (MD not present to confirm). Recommend continuing regular solids and thin liquids as tolerated.   Swallow Evaluation Recommendations       SLP Diet Recommendations: Regular solids;Thin liquid   Liquid Administration via: Cup;Straw   Medication Administration: Whole meds with liquid   Supervision: Patient able to self feed;Intermittent supervision to cue for compensatory strategies   Compensations: Slow rate;Small sips/bites;Minimize environmental distractions   Postural Changes: Seated upright at 90 degrees;Remain semi-upright after after feeds/meals (Comment)   Oral Care Recommendations: Oral care BID        Osie Bond., M.A. [place] Office [phone]  Secure chat preferred  07/02/2022,3:12 PM

## 2022-07-02 NOTE — ED Notes (Signed)
Patient being transported to Vascular US

## 2022-07-02 NOTE — Plan of Care (Signed)
  Problem: Activity: Goal: Ability to tolerate increased activity will improve Outcome: Progressing   Problem: Respiratory: Goal: Ability to maintain adequate ventilation will improve Outcome: Progressing Goal: Ability to maintain a clear airway will improve Outcome: Progressing   Problem: Clinical Measurements: Goal: Ability to maintain a body temperature in the normal range will improve Outcome: Progressing   Problem: Education: Goal: Knowledge of General Education information will improve Description: Including pain rating scale, medication(s)/side effects and non-pharmacologic comfort measures Outcome: Progressing   Problem: Health Behavior/Discharge Planning: Goal: Ability to manage health-related needs will improve Outcome: Progressing   Problem: Clinical Measurements: Goal: Ability to maintain clinical measurements within normal limits will improve Outcome: Progressing Goal: Will remain free from infection Outcome: Progressing Goal: Diagnostic test results will improve Outcome: Progressing Goal: Respiratory complications will improve Outcome: Progressing Goal: Cardiovascular complication will be avoided Outcome: Progressing   Problem: Activity: Goal: Risk for activity intolerance will decrease Outcome: Progressing

## 2022-07-03 DIAGNOSIS — I10 Essential (primary) hypertension: Secondary | ICD-10-CM | POA: Diagnosis not present

## 2022-07-03 DIAGNOSIS — J189 Pneumonia, unspecified organism: Secondary | ICD-10-CM | POA: Diagnosis not present

## 2022-07-03 DIAGNOSIS — R519 Headache, unspecified: Secondary | ICD-10-CM | POA: Diagnosis not present

## 2022-07-03 DIAGNOSIS — M316 Other giant cell arteritis: Secondary | ICD-10-CM | POA: Diagnosis not present

## 2022-07-03 DIAGNOSIS — R700 Elevated erythrocyte sedimentation rate: Secondary | ICD-10-CM | POA: Diagnosis not present

## 2022-07-03 LAB — MAGNESIUM: Magnesium: 2.5 mg/dL — ABNORMAL HIGH (ref 1.7–2.4)

## 2022-07-03 LAB — CBC WITH DIFFERENTIAL/PLATELET
Abs Immature Granulocytes: 0.02 10*3/uL (ref 0.00–0.07)
Basophils Absolute: 0 10*3/uL (ref 0.0–0.1)
Basophils Relative: 0 %
Eosinophils Absolute: 0.1 10*3/uL (ref 0.0–0.5)
Eosinophils Relative: 1 %
HCT: 26.9 % — ABNORMAL LOW (ref 36.0–46.0)
Hemoglobin: 8.9 g/dL — ABNORMAL LOW (ref 12.0–15.0)
Immature Granulocytes: 0 %
Lymphocytes Relative: 13 %
Lymphs Abs: 1 10*3/uL (ref 0.7–4.0)
MCH: 33.1 pg (ref 26.0–34.0)
MCHC: 33.1 g/dL (ref 30.0–36.0)
MCV: 100 fL (ref 80.0–100.0)
Monocytes Absolute: 1 10*3/uL (ref 0.1–1.0)
Monocytes Relative: 13 %
Neutro Abs: 5.5 10*3/uL (ref 1.7–7.7)
Neutrophils Relative %: 73 %
Platelets: 183 10*3/uL (ref 150–400)
RBC: 2.69 MIL/uL — ABNORMAL LOW (ref 3.87–5.11)
RDW: 13.7 % (ref 11.5–15.5)
WBC: 7.5 10*3/uL (ref 4.0–10.5)
nRBC: 0 % (ref 0.0–0.2)

## 2022-07-03 LAB — COMPREHENSIVE METABOLIC PANEL
ALT: 170 U/L — ABNORMAL HIGH (ref 0–44)
AST: 133 U/L — ABNORMAL HIGH (ref 15–41)
Albumin: 2.4 g/dL — ABNORMAL LOW (ref 3.5–5.0)
Alkaline Phosphatase: 62 U/L (ref 38–126)
Anion gap: 7 (ref 5–15)
BUN: 11 mg/dL (ref 8–23)
CO2: 19 mmol/L — ABNORMAL LOW (ref 22–32)
Calcium: 8.1 mg/dL — ABNORMAL LOW (ref 8.9–10.3)
Chloride: 109 mmol/L (ref 98–111)
Creatinine, Ser: 0.7 mg/dL (ref 0.44–1.00)
GFR, Estimated: >60 mL/min (ref >60)
Glucose, Bld: 101 mg/dL — ABNORMAL HIGH (ref 70–99)
Potassium: 4 mmol/L (ref 3.5–5.1)
Sodium: 135 mmol/L (ref 135–145)
Total Bilirubin: 0.4 mg/dL (ref 0.3–1.2)
Total Protein: 5.5 g/dL — ABNORMAL LOW (ref 6.5–8.1)

## 2022-07-03 LAB — SEDIMENTATION RATE: Sed Rate: 81 mm/hr — ABNORMAL HIGH (ref 0–22)

## 2022-07-03 LAB — C-REACTIVE PROTEIN: CRP: 16.7 mg/dL — ABNORMAL HIGH (ref <1.0)

## 2022-07-03 LAB — PROCALCITONIN: Procalcitonin: 0.94 ng/mL

## 2022-07-03 LAB — BRAIN NATRIURETIC PEPTIDE: B Natriuretic Peptide: 510.5 pg/mL — ABNORMAL HIGH (ref 0.0–100.0)

## 2022-07-03 MED ORDER — FLUTICASONE PROPIONATE 50 MCG/ACT NA SUSP
2.0000 | Freq: Every day | NASAL | Status: DC
  Administered 2022-07-04 – 2022-07-08 (×5): 2 via NASAL
  Filled 2022-07-03: qty 16

## 2022-07-03 MED ORDER — LORATADINE 10 MG PO TABS
10.0000 mg | ORAL_TABLET | Freq: Every day | ORAL | Status: DC
  Administered 2022-07-03 – 2022-07-08 (×6): 10 mg via ORAL
  Filled 2022-07-03 (×6): qty 1

## 2022-07-03 MED ORDER — TRAMADOL HCL 50 MG PO TABS
50.0000 mg | ORAL_TABLET | Freq: Once | ORAL | Status: AC
  Administered 2022-07-03: 50 mg via ORAL
  Filled 2022-07-03: qty 1

## 2022-07-03 NOTE — H&P (Signed)
ASSESSMENT & PLAN   POSSIBLE TEMPORAL ARTERITIS: This patient has been having left temporal headaches and has an elevated sed rate.  We have been asked to perform a left temporal artery biopsy.  She has an easily palpable left temporal pulse.  I have discussed the indications for the procedure and the potential complications with the patient, her nurse who is with her continually, and her husband Dr. Velora Heckler.  She is scheduled for surgery on Monday.  REASON FOR CONSULT:    For temporal artery biopsy.  The consult is requested by Dr. Candiss Norse.  HPI:   Meghan Oliver is a 81 y.o. female who was admitted on 07/01/2022 with a severe headache and cough.  She had been having a left temporal headache for 3 days.  She had an elevated sed rate and for this reason temporal arteritis was entertained.  We were asked to perform a left temporal artery biopsy.  The patient has a 24-hour nurses who is at the bedside.  She is very helpful.  Patient has been having some left temporal headaches which are a little better today.  She has had no visual disturbances.  Past Medical History:  Diagnosis Date   Anxiety    Arthritis    "hips, spine" (03/17/2018)   Bipolar II disorder (HCC)    CHF (congestive heart failure) (HCC)    Chronic bronchitis (HCC)    Chronic lower back pain    Chronic right hip pain    CKD (chronic kidney disease), stage II    Coronary artery disease    stent x1   Esophagitis, erosive    GAD (generalized anxiety disorder)    GERD (gastroesophageal reflux disease)    Headache    "maybe monthly" (03/17/2018))   Heart murmur, systolic    History of adenomatous polyp of colon    08-04-2016  tubular adenoma   History of blood transfusion 12/2017   "related to vascular hematoma"   History of electroconvulsive therapy    at [place]--  started 04-15-2015 to 11-17-2016  total greater than 40 times   History of hiatal hernia    Hyperlipidemia    Hypertension    Hypothyroidism    Internal  carotid artery stenosis, bilateral    per last duplex 05-01-2014  bilateral ICA 40-59%   Major depression, chronic    ECT treatments extensive and multiple started 07/ 2016   Memory loss    "both short and long-term; needs frequent reminders to follow instrucitons" (05/16/2017)   Migraines    "none in years" (03/17/2018)   OSA (obstructive sleep apnea)    per study 06/ 2012 moderate OSA  ; "refuses to wear masks" (03/17/2018)   Osteoporosis    Poor historian    due to short term memory loss   Presence of permanent cardiac pacemaker 03/17/2018   Pulmonary nodule    monitored by pcp   S/P placement of cardiac pacemaker 03/17/18 [facility]  03/18/2018   Short-term memory loss    Sick sinus syndrome ([place])     Family History  Problem Relation Age of Onset   Heart attack Father 70       deceased   Hypertension Father    Heart disease Father    Breast cancer Paternal 5        Age unknown   Breast cancer Paternal 16        Age 84's   Colon cancer Neg Hx     SOCIAL HISTORY: Social History  Tobacco Use   Smoking status: Former    Packs/day: 2.00    Years: 15.00    Total pack years: 30.00    Types: Cigarettes    Quit date: 01/13/1971    Years since quitting: 51.5   Smokeless tobacco: Never  Substance Use Topics   Alcohol use: Yes    Comment: occassional 1 x a week    Allergies  Allergen Reactions   Propranolol Other (See Comments)    Low blood pressure   Brexpiprazole Other (See Comments)    Aphasia and catatonia    Current Facility-Administered Medications  Medication Dose Route Frequency Provider Last Rate Last Admin   0.9 %  sodium chloride infusion   Intravenous Continuous Orma Flaming, MD 75 mL/hr at 07/03/22 0317 Infusion Verify at 07/03/22 0317   acetaminophen (TYLENOL) tablet 650 mg  650 mg Oral Q6H PRN Orma Flaming, MD   650 mg at 07/02/22 2108   Or   acetaminophen (TYLENOL) suppository 650 mg  650 mg Rectal Q6H PRN Orma Flaming, MD        albuterol (PROVENTIL) (2.5 MG/3ML) 0.083% nebulizer solution 2.5 mg  2.5 mg Nebulization Q6H PRN Orma Flaming, MD       aspirin chewable tablet 81 mg  81 mg Oral Daily Orma Flaming, MD   81 mg at 07/03/22 0913   azithromycin (ZITHROMAX) 500 mg in sodium chloride 0.9 % 250 mL IVPB  500 mg Intravenous Daily Orma Flaming, MD 250 mL/hr at 07/03/22 1000 500 mg at 07/03/22 1000   benzonatate (TESSALON) capsule 100 mg  100 mg Oral TID Thurnell Lose, MD   100 mg at 07/03/22 0913   cefTRIAXone (ROCEPHIN) 1 g in sodium chloride 0.9 % 100 mL IVPB  1 g Intravenous Daily Orma Flaming, MD 200 mL/hr at 07/03/22 0929 1 g at 07/03/22 0929   cyanocobalamin (VITAMIN B12) tablet 1,000 mcg  1,000 mcg Oral QHS Lala Lund K, MD   1,000 mcg at 07/02/22 2109   enoxaparin (LOVENOX) injection 30 mg  30 mg Subcutaneous Q24H Reome, Earle J, RPH   30 mg at 07/02/22 2109   fesoterodine (TOVIAZ) tablet 8 mg  8 mg Oral Daily Orma Flaming, MD   8 mg at 07/03/22 0913   fluticasone (FLONASE) 50 MCG/ACT nasal spray 2 spray  2 spray Each Nare Daily Thurnell Lose, MD       gabapentin (NEURONTIN) capsule 300 mg  300 mg Oral BID Thurnell Lose, MD   300 mg at 07/03/22 0913   guaiFENesin-dextromethorphan (ROBITUSSIN DM) 100-10 MG/5ML syrup 10 mL  10 mL Oral Q4H PRN Thurnell Lose, MD   10 mL at 07/03/22 1884   levothyroxine (SYNTHROID) tablet 75 mcg  75 mcg Oral Q0600 Orma Flaming, MD   75 mcg at 07/03/22 0516   linaclotide (LINZESS) capsule 72 mcg  72 mcg Oral BID Orma Flaming, MD   72 mcg at 07/03/22 0912   lithium carbonate capsule 300 mg  300 mg Oral QHS Orma Flaming, MD   300 mg at 07/02/22 2119   loratadine (CLARITIN) tablet 10 mg  10 mg Oral Daily Thurnell Lose, MD   10 mg at 07/03/22 0911   LORazepam (ATIVAN) injection 1 mg  1 mg Intravenous Once PRN Thurnell Lose, MD       LORazepam (ATIVAN) tablet 0.5 mg  0.5 mg Oral Q8H PRN Reome, Earle J, RPH   0.5 mg at 07/02/22 0244   memantine  (NAMENDA) tablet  10 mg  10 mg Oral BID Orma Flaming, MD   10 mg at 07/03/22 0913   mirtazapine (REMERON) tablet 7.5 mg  7.5 mg Oral QHS Orma Flaming, MD   7.5 mg at 07/02/22 2109   naloxegol oxalate (MOVANTIK) tablet 12.5 mg  12.5 mg Oral Daily PRN Orma Flaming, MD       nortriptyline (PAMELOR) capsule 75 mg  75 mg Oral QHS Orma Flaming, MD   75 mg at 07/02/22 2107   ondansetron (ZOFRAN) injection 4 mg  4 mg Intravenous Q8H PRN Orma Flaming, MD       pantoprazole (PROTONIX) EC tablet 40 mg  40 mg Oral BID AC Orma Flaming, MD   40 mg at 07/03/22 0913   PARoxetine (PAXIL) tablet 20 mg  20 mg Oral QHS Orma Flaming, MD   20 mg at 07/02/22 2108   polyethylene glycol (MIRALAX / GLYCOLAX) packet 17 g  17 g Oral Daily PRN Orma Flaming, MD       primidone (MYSOLINE) tablet 100 mg  100 mg Oral q AM Orma Flaming, MD   100 mg at 07/03/22 0911   primidone (MYSOLINE) tablet 50 mg  50 mg Oral QHS Orma Flaming, MD   50 mg at 07/02/22 2108   pyridOXINE (VITAMIN B6) tablet 500 mg  500 mg Oral BID Thurnell Lose, MD   500 mg at 07/03/22 0913   rosuvastatin (CRESTOR) tablet 40 mg  40 mg Oral Daily Orma Flaming, MD   40 mg at 07/03/22 0913   senna-docusate (Senokot-S) tablet 2 tablet  2 tablet Oral BID Orma Flaming, MD   2 tablet at 07/03/22 0911   traMADol (ULTRAM) tablet 50 mg  50 mg Oral Q6H PRN Thurnell Lose, MD   50 mg at 07/02/22 2107    REVIEW OF SYSTEMS:  '[X]'$  denotes positive finding, '[ ]'$  denotes negative finding Cardiac  Comments:  Chest pain or chest pressure:    Shortness of breath upon exertion:    Short of breath when lying flat:    Irregular heart rhythm:        Vascular    Pain in calf, thigh, or hip brought on by ambulation:    Pain in feet at night that wakes you up from your sleep:     Blood clot in your veins:    Leg swelling:         Pulmonary    Oxygen at home:    Cough:  x   Wheezing:         Neurologic    Sudden weakness in arms or legs:      Sudden numbness in arms or legs:     Sudden onset of difficulty speaking or slurred speech:    Temporary loss of vision in one eye:     Problems with dizziness:         Gastrointestinal    Blood in stool:     Vomited blood:         Genitourinary    Burning when urinating:     Blood in urine:        Psychiatric    Major depression:         Hematologic    Bleeding problems:    Problems with blood clotting too easily:        Skin    Rashes or ulcers:        Constitutional    Fever or chills:    -  PHYSICAL EXAM:   Vitals:   07/02/22 2049 07/02/22 2302 07/03/22 0304 07/03/22 0800  BP: 111/62 (!) 114/48 (!) 118/54 (!) 117/48  Pulse: 62 69 71 73  Resp: '18 20 20 19  '$ Temp: 98.2 F (36.8 C) 98.2 F (36.8 C) 98.2 F (36.8 C) 98.8 F (37.1 C)  TempSrc: Oral Oral Oral Oral  SpO2: 94% 91% 93% 98%  Weight:      Height:       Body mass index is 19.65 kg/m. GENERAL: The patient is a well-nourished female, in no acute distress. The vital signs are documented above. CARDIAC: There is a regular rate and rhythm.  VASCULAR: I do not detect carotid bruits. She has palpable temporal pulses. He has palpable pedal pulses. She has no significant lower extremity swelling. PULMONARY: There is good air exchange bilaterally without wheezing or rales. MUSCULOSKELETAL: There are no major deformities.  DATA:    LABS: Her sed rate on 07/01/2022 was 70.  This was repeated today and it was 81.  Meghan Oliver [facility]

## 2022-07-03 NOTE — Progress Notes (Addendum)
NEUROLOGY CONSULTATION PROGRESS NOTE   Date of service: July 03, 2022 Patient Name: Meghan Oliver MRN:  [id number] DOB:  Jun 09, 1941  Brief HPI  Meghan Oliver is a 81 y.o. female with hx of HTN, HLD, hypothyroidism, OSA, GERD, sinus node dysfunction s/p PPM in 03/2018, bipolar and depression, migrainous headache when young but none in decades who presents with acute onset short lasting stabbing headaches. Atypical for temporal arteritis with no jaw claudication, no fever.  Interval history: Patient is asleep in the bed. Her husband, Dr. Velora Heckler is at the bedside. Headaches are better, still occurring at less frequency. [name] bilateral temporal arteries negative for Halo sign. Vascular surgery has seen patient and she is scheduled for temporal artery biopsy on Monday.    Vitals:   07/02/22 2302 07/03/22 0304 07/03/22 0800 07/03/22 1202  BP: (!) 114/48 (!) 118/54 (!) 117/48 (!) 106/48  Pulse: 69 71 73 65  Resp: _0 Temp: 98.2 F (36.8 C) 98.2 F (36.8 C) 98.8 F (37.1 C) 98.5 F (36.9 C)  TempSrc: Oral Oral Oral Oral  SpO2: 91% 93% 98% 94%  Weight:      Height:         Body mass index is 19.65 kg/m.  Physical Exam   General: asleep in bed; in no acute distress.  HENT: Normal oropharynx and mucosa. Normal external appearance of ears and nose.  Neck: Supple, no pain or tenderness  CV: No JVD. No peripheral edema.  Pulmonary: Symmetric Chest rise. Normal respiratory effort.  Abdomen: Soft to touch, non-tender.  Ext: No cyanosis, edema, or deformity  Skin: No rash. Normal palpation of skin.   Musculoskeletal: Normal digits and nails by inspection. No clubbing.   Neurologic Examination  Mental status/Cognition: somnolent, opens eye to gentle tactile stimulation and to her name. Falls back asleep in a few secs. Speech/language: mumbled her name Cranial nerves:   CN II Made eye contact on the right.   CN III,IV,VI No obvious gaze preference noted.   CN V    CN VII  no asymmetry, no nasolabial fold flattening   CN VIII Turns head towards speech   CN IX & X    CN XI Head midline   CN XII    Motor:  Muscle bulk: poor, tone normal Moving all extremities spontaneously in bed.  Labs   Basic Metabolic Panel:  Lab Results  Component Value Date   NA 135 07/03/2022   K 4.0 07/03/2022   CO2 19 (L) 07/03/2022   GLUCOSE 101 (H) 07/03/2022   BUN 11 07/03/2022   CREATININE 0.70 07/03/2022   CALCIUM 8.1 (L) 07/03/2022   GFRNONAA >60 07/03/2022   GFRAA 58 (L) 07/10/2020   HbA1c:  Lab Results  Component Value Date   HGBA1C 5.1 02/15/2011   LDL:  Lab Results  Component Value Date   LDLCALC 79 03/05/2022   Urine Drug Screen:     Component Value Date/Time   LABOPIA NONE DETECTED 07/13/2017 1610   COCAINSCRNUR NONE DETECTED 07/13/2017 1610   LABBENZ POSITIVE (A) 07/13/2017 1610   AMPHETMU NONE DETECTED 07/13/2017 1610   THCU NONE DETECTED 07/13/2017 1610   LABBARB POSITIVE (A) 07/13/2017 1610    Alcohol Level     Component Value Date/Time   ETH <10 07/13/2017 1431   ESR 81 CRP 16.7 ANCA titers pending   Imaging and Diagnostic studies  Results for orders placed during the hospital encounter of 07/01/22  CT Angio Head W or  Wo Contrast 1. No evidence of large vessel occlusion or proximal hemodynamically significant stenosis. No aneurysm identified. 2. No evidence of dural venous sinus thrombosis.  MR Brain with and without contrast: 1. No acute intracranial abnormality. 2. Mild chronic small vessel ischemic disease.  CT venogram head: No dural sinus thrombosis.   [name] temporal arteries:  Negative for Halo sign   Impression   Meghan Oliver is a 81 y.o. female with short lasting stabbing headaches which appear to most consistent with neuralgiform headaches. Atypical for temporal arteritis given no jaw claudication, no fever, no obvious temple tenderness, vision loss or vision blurring. However, ESR and CRP elevated(could be  explained by Pneumonia?), asymmetric temporal pulses.  Recommendations   -scheduled for temporal artery biopsy on Monday  - If she reports visual symptoms, recommend one time dose of IV solumedrol 1055m once - neurology will continue to follow   DBeulah GandyDNP, ACNPC-AG  ______________________________________________________________________    NEUROHOSPITALIST ADDENDUM Performed a face to face diagnostic evaluation.   I have reviewed the contents of history and physical exam as documented by PA/ARNP/Resident and agree with above documentation.  I have discussed and formulated the above plan as documented. Edits to the note have been made as needed.  Impression/Key exam findings/Plan: pleasantly delirious, still reporting Bitemporal sharp stabbing pain. Primary headache are rare in this age group and thus despite atypical presentation, temporal arteritis is still on the differential.  Vasc surg consulted for temporal artery biopsy.  I gave her an additional dose of Tramadol. She feels it really helped with her pain.   Discussed plan in detail with Dr. LVelora Hecklerat bedside.  She would benefit [facility]   SDonnetta Simpers MD [facility] [phone]  If 7pm to 7am, please call on call as listed on AMION.

## 2022-07-03 NOTE — Progress Notes (Signed)
PROGRESS NOTE                                                                                                                                                                                                             Patient Demographics:    Meghan Oliver, is a 81 y.o. female, DOB - 16-Mar-1941, GPQ:[id number]  Outpatient Primary MD for the patient is Baxley, Cresenciano Lick, MD    LOS - 1  Admit date - 07/01/2022    Chief Complaint  Patient presents with   Headache       Brief Narrative (HPI from H&P)   81 y.o. female with medical history significant of HTN, HLD, hypothyroidism, OSA, GERD, sinus node dysfunction s/p PPM in 03/2018, bipolar and depression who presented to ED with 3 day history of cough, chills and a severe headache. New intermittent episodes of headache on left temple x 3 days prior to hospitalization of note she had similar headaches about 2 months ago which lasted for a few days.  No photophobia, no jaw claudication, no problems chewing food.  No temporal tenderness.  In the hospital she was diagnosed with pneumonia and admitted to the hospital.  Neurology was consulted for headaches.   Subjective:   In bed sleeping in no distress, no headache at this time, no chest pain.  No vision changes   Assessment  & Plan :    Community acquired pneumonia - she has been started on appropriate antibiotics, has intermittent delirium and a very weak cough reflex increasing the chances of aspiration, will involve speech therapy, continue supportive care with oxygen, I-S and flutter valve for pulmonary toiletry.  Supportive care for cough.  Follow cultures closely.  Pneumonia could explain her elevated ESR and CRP.  However if her inflammatory markers remain significantly high despite appropriate treatment.  Intermittent episodes of left temporal headache - 3 day history of intermittent left sided temporal headache with some radiation  to right side. Non tender on exam, no pain with chewing, no phonophobia/photophobia.  This happened 2 months ago and lasted for a few days, ESR is still significantly high, CT scan head, CT angiogram head and neck and CT venogram nonacute.  Temporal artery ultrasound and MRI brain stable.  Case discussed with neurologist Dr. Leonel Ramsay, Dr. Alferd Patee, withhold steroids, we will proceed with temporal artery biopsy to  definitely rule out or rule in giant cell arteritis, for any steroid use to neurology, vascular surgery consulted for biopsy.  Elevated troponin - She has no chest pain or ekg changes, troponin trend flat and not an ACS pattern, continue aspirin and statin for secondary prevention Case discussed with her cardiologist Dr. Loralie Champagne, no further work-up for now.  Macrocytic anemia - Hb nadir of 9.7 after hip surgery in August, iron studies inconclusive B12 stable, continue to monitor no signs of infection.  Some drop due to hemodilution.  Continue PPI twice daily.  Bipolar/depression - History of ECT x 30 in past, Continue home medication of paxil, pamelor, remeron and ativan PRN and lithium, stable lithium level.  Sinus node dysfunction (HCC) - S/p St. Jude PPM in 03/2018.   Memory loss - Continue Namenda, at risk for delirium and aspiration.  Discussed with her primary nurse and husband on 07/02/2022.  Hyperlipidemia - Continue Crestor.   Hypothyroidism - TSH wnl in 03/2022, Continue home synthroid.   Chronic constipation - Continue home senokot, linzess, miralaz prn, also has movik prn.   GERD - Continue PPI.   OSA (obstructive sleep apnea) - Not on cpap, non compliant    Hypomagnesemia.  Replaced.       Condition - Extremely Guarded  Family Communication  :    Updated personal nurse at bedside in detail on 07/02/2022, 07/03/22.  Updated spouse Cherry Turlington [phone] on 07/02/2022, 07/03/22 message left at 9:43 AM.  Code Status :  Full  Consults  :  Neuro  PUD  Prophylaxis : PPI   Procedures  :     MRI - 1. No acute intracranial abnormality. 2. Mild chronic small vessel ischemic disease  [name] Temp Artery - Stable  CT scan head, CT angiogram head and neck and CT venogram nonacute      Disposition Plan  :    Status is: Observation  DVT Prophylaxis  :    enoxaparin (LOVENOX) injection 30 mg Start: 07/01/22 1915   Lab Results  Component Value Date   PLT 183 07/03/2022    Diet :  Diet Order             Diet regular Room service appropriate? Yes; Fluid consistency: Thin  Diet effective now                    Inpatient Medications  Scheduled Meds:  aspirin  81 mg Oral Daily   benzonatate  100 mg Oral TID   cyanocobalamin  1,000 mcg Oral QHS   enoxaparin (LOVENOX) injection  30 mg Subcutaneous Q24H   fesoterodine  8 mg Oral Daily   fluticasone  2 spray Each Nare Daily   gabapentin  300 mg Oral BID   levothyroxine  75 mcg Oral Q0600   linaclotide  72 mcg Oral BID   lithium carbonate  300 mg Oral QHS   loratadine  10 mg Oral Daily   memantine  10 mg Oral BID   mirtazapine  7.5 mg Oral QHS   nortriptyline  75 mg Oral QHS   pantoprazole  40 mg Oral BID AC   PARoxetine  20 mg Oral QHS   primidone  100 mg Oral q AM   primidone  50 mg Oral QHS   vitamin B-6  500 mg Oral BID   rosuvastatin  40 mg Oral Daily   senna-docusate  2 tablet Oral BID   Continuous Infusions:  sodium chloride 75 mL/hr at 07/03/22 6237  azithromycin (ZITHROMAX) 500 mg in sodium chloride 0.9 % 250 mL IVPB Stopped (07/02/22 1401)   cefTRIAXone (ROCEPHIN)  IV 1 g (07/03/22 0929)   PRN Meds:.acetaminophen **OR** acetaminophen, albuterol, guaiFENesin-dextromethorphan, LORazepam, LORazepam, naloxegol oxalate, ondansetron (ZOFRAN) IV, polyethylene glycol, traMADol  Antibiotics  :    Anti-infectives (From admission, onward)    Start     Dose/Rate Route Frequency Ordered Stop   07/02/22 1000  azithromycin (ZITHROMAX) 500 mg in sodium chloride 0.9 %  250 mL IVPB        500 mg 250 mL/hr over 60 Minutes Intravenous Daily 07/01/22 2352     07/02/22 1000  cefTRIAXone (ROCEPHIN) 1 g in sodium chloride 0.9 % 100 mL IVPB        1 g 200 mL/hr over 30 Minutes Intravenous Daily 07/01/22 2352     07/01/22 1130  cefTRIAXone (ROCEPHIN) 1 g in sodium chloride 0.9 % 100 mL IVPB        1 g 200 mL/hr over 30 Minutes Intravenous  Once 07/01/22 1121 07/01/22 1348   07/01/22 1130  azithromycin (ZITHROMAX) 500 mg in sodium chloride 0.9 % 250 mL IVPB        500 mg 250 mL/hr over 60 Minutes Intravenous  Once 07/01/22 1121 07/01/22 1348        Time Spent in minutes  30   Lala Lund M.D on 07/03/2022 at 9:40 AM  To page go to [email or web address]   [facility] -  Office  [phone]  See all Orders from today for further details    Objective:   Vitals:   07/02/22 2049 07/02/22 2302 07/03/22 0304 07/03/22 0800  BP: 111/62 (!) 114/48 (!) 118/54 (!) 117/48  Pulse: 62 69 71 73  Resp: 18 20 20 19   Temp: 98.2 F (36.8 C) 98.2 F (36.8 C) 98.2 F (36.8 C) 98.8 F (37.1 C)  TempSrc: Oral Oral Oral Oral  SpO2: 94% 91% 93% 98%  Weight:      Height:        Wt Readings from Last 3 Encounters:  07/01/22 42.6 kg  05/18/22 42.6 kg  03/31/22 44.7 kg     Intake/Output Summary (Last 24 hours) at 07/03/2022 0940 Last data filed at 07/03/2022 8241 Gross per 24 hour  Intake 2379.14 ml  Output --  Net 2379.14 ml     Physical Exam  Sleeping, No new F.N deficits,   [place].AT,PERRAL Supple Neck, No JVD,   Symmetrical Chest wall movement, Good air movement bilaterally, CTAB RRR,No Gallops, Rubs or new Murmurs,  +ve B.Sounds, Abd Soft, No tenderness,   No Cyanosis, Clubbing or edema     Data Review:    CBC Recent Labs  Lab 07/01/22 1140 07/02/22 0737 07/03/22 0652  WBC 10.8* 8.9 7.5  HGB 10.1* 9.1* 8.9*  HCT 32.3* 29.0* 26.9*  PLT 179 174 183  MCV 103.2* 103.6* 100.0  MCH 32.3 32.5 33.1  MCHC 31.3 31.4 33.1  RDW 13.4 13.4 13.7   LYMPHSABS 0.8  --  1.0  MONOABS 0.6  --  1.0  EOSABS 0.1  --  0.1  BASOSABS 0.0  --  0.0    Electrolytes Recent Labs  Lab 07/01/22 1140 07/01/22 1618 07/01/22 1818 07/01/22 2052 07/02/22 0737 07/03/22 0652  NA 136  --   --   --  135 135  K 4.2  --   --   --  3.9 4.0  CL 105  --   --   --  107 109  CO2 22  --   --   --  24 19*  GLUCOSE 86  --   --   --  102* 101*  BUN 24*  --   --   --  20 11  CREATININE 0.74  --   --   --  0.74 0.70  CALCIUM 9.0  --   --   --  8.0* 8.1*  AST  --   --   --   --   --  133*  ALT  --   --   --   --   --  170*  ALKPHOS  --   --   --   --   --  62  BILITOT  --   --   --   --   --  0.4  ALBUMIN  --   --   --   --   --  2.4*  MG  --   --   --   --  1.6* 2.5*  CRP  --  18.3* 18.1*  --   --  16.7*  PROCALCITON  --   --   --  2.05 1.51 0.94  BNP  --   --   --  206.2*  --  510.5*    ------------------------------------------------------------------------------------------------------------------ No results for input(s): "CHOL", "HDL", "LDLCALC", "TRIG", "CHOLHDL", "LDLDIRECT" in the last 72 hours.  Lab Results  Component Value Date   HGBA1C 5.1 02/15/2011    No results for input(s): "TSH", "T4TOTAL", "T3FREE", "THYROIDAB" in the last 72 hours.  Invalid input(s): "FREET3" ------------------------------------------------------------------------------------------------------------------ ID Labs Recent Labs  Lab 07/01/22 1140 07/01/22 1618 07/01/22 1818 07/01/22 2052 07/02/22 0737 07/03/22 0652  WBC 10.8*  --   --   --  8.9 7.5  PLT 179  --   --   --  174 183  CRP  --  18.3* 18.1*  --   --  16.7*  PROCALCITON  --   --   --  2.05 1.51 0.94  CREATININE 0.74  --   --   --  0.74 0.70   Radiology Reports DG Swallowing Func-Speech Pathology  Result Date: 07/02/2022 Table formatting from the original result was not included. Objective Swallowing Evaluation: Type of Study: MBS-Modified Barium Swallow Study  Patient Details Name: STEPHANIA MACFARLANE MRN: [id number] Date of Birth: 11/04/1940 Today's Date: 07/02/2022 Time: SLP Start Time (ACUTE ONLY): 1791 -SLP Stop Time (ACUTE ONLY): 5056 SLP Time Calculation (min) (ACUTE ONLY): 17 min Past Medical History: Past Medical History: Diagnosis Date  Anxiety   Arthritis   "hips, spine" (03/17/2018)  Bipolar II disorder (HCC)   CHF (congestive heart failure) (HCC)   Chronic bronchitis (HCC)   Chronic lower back pain   Chronic right hip pain   CKD (chronic kidney disease), stage II   Coronary artery disease   stent x1  Esophagitis, erosive   GAD (generalized anxiety disorder)   GERD (gastroesophageal reflux disease)   Headache   "maybe monthly" (03/17/2018))  Heart murmur, systolic   History of adenomatous polyp of colon   08-04-2016  tubular adenoma  History of blood transfusion 12/2017  "related to vascular hematoma"  History of electroconvulsive therapy   at [place]--  started 04-15-2015 to 11-17-2016  total greater than 40 times  History of hiatal hernia   Hyperlipidemia   Hypertension   Hypothyroidism   Internal carotid artery stenosis, bilateral   per last duplex 05-01-2014  bilateral ICA 40-59%  Major depression, chronic  ECT treatments extensive and multiple started 07/ 2016  Memory loss   "both short and long-term; needs frequent reminders to follow instrucitons" (05/16/2017)  Migraines   "none in years" (03/17/2018)  OSA (obstructive sleep apnea)   per study 06/ 2012 moderate OSA  ; "refuses to wear masks" (03/17/2018)  Osteoporosis   Poor historian   due to short term memory loss  Presence of permanent cardiac pacemaker 03/17/2018  Pulmonary nodule   monitored by pcp  S/P placement of cardiac pacemaker 03/17/18 [facility]  03/18/2018  Short-term memory loss   Sick sinus syndrome ([place])  Past Surgical History: Past Surgical History: Procedure Laterality Date  APPENDECTOMY  1971  AUGMENTATION MAMMAPLASTY Bilateral   CARDIOVASCULAR STRESS TEST  01-30-2015  dr Aundra Dubin  Low risk nuclear study w/ no evidence ischemia or  infarction/  normal LV funciton and wall motion , 76%  COLONOSCOPY W/ BIOPSIES AND POLYPECTOMY  "multiple"  COLONOSCOPY WITH ESOPHAGOGASTRODUODENOSCOPY (EGD)  last one 08-04-2016  CORONARY ANGIOPLASTY WITH STENT PLACEMENT  05/16/2017  "LAD"  CORONARY STENT INTERVENTION N/A 05/16/2017  Procedure: CORONARY STENT INTERVENTION;  Surgeon: Burnell Blanks, MD;  Location: [place] [facility];  Service: Cardiovascular;  Laterality: N/A;  ESOPHAGOGASTRODUODENOSCOPY  02-26-04  ESOPHAGOGASTRODUODENOSCOPY  "multiple"  INSERT / REPLACE / REMOVE PACEMAKER  03/17/2018  LEFT HEART CATH AND CORONARY ANGIOGRAPHY N/A 05/16/2017  Procedure: LEFT HEART CATH AND CORONARY ANGIOGRAPHY;  Surgeon: Larey Dresser, MD;  Location: [place] [facility];  Service: Cardiovascular;  Laterality: N/A;  OVARIAN CYST SURGERY  1970s  Laparotomy   PACEMAKER IMPLANT N/A 03/17/2018  Procedure: PACEMAKER IMPLANT;  Surgeon: Evans Lance, MD;  Location: [place] [facility];  Service: Cardiovascular;  Laterality: N/A;  PORT-A-CATH PLACEMENT  05/31/2016; 2018  "@ [facility]; for ECT series"; "@ [facility] also"  PORT-A-CATH REMOVAL N/A 03/11/2017  Procedure: REMOVAL PORT-A-CATH;  Surgeon: Jackolyn Confer, MD;  Location: [facility];  Service: General;  Laterality: N/A;  PORTA CATH REMOVAL  03/17/2018  PORTA CATH REMOVAL  03/17/2018  Procedure: PORTA CATH REMOVAL;  Surgeon: Evans Lance, MD;  Location: [place] [facility];  Service: Cardiovascular;;  TOTAL HIP ARTHROPLASTY Right 05/18/2022  Procedure: TOTAL HIP ARTHROPLASTY ANTERIOR APPROACH;  Surgeon: Paralee Cancel, MD;  Location: [facility];  Service: Orthopedics;  Laterality: Right;  TRANSTHORACIC ECHOCARDIOGRAM  09/05/2013  dr Aundra Dubin  mild LVH, ef 40-10%, grade 1 diastolic dysfunction/  very mild AV stenosis with mild AR/  trivial MR and PT/ mild to moderate LAE/ mild TR/ mild pulmonary hypertension with PA peak pressure 28mHg  TUBAL LIGATION   HPI: 81y.o. female who presented to ED with 3 day history  of cough, chills and a severe headache. New intermittent episodes of headache on left temple x 3 days prior to hospitalization of note she had similar headaches about 2 months ago which lasted for a few days.  In the hospital she was diagnosed with pneumonia and admitted to the hospital.  Neurology was consulted for headaches. Pt with medical history significant of HTN, HLD, hypothyroidism, OSA, GERD, sinus node dysfunction s/p PPM in 03/2018, bipolar and depression. Caregiver reports hx esophageal dilation x2.  Subjective: awake, cooperative but also confused, asking some repetitive questions  Recommendations for follow up therapy are one component of a multi-disciplinary discharge planning process, led by the attending physician.  Recommendations may be updated based on patient status, additional functional criteria and insurance authorization. Assessment / Plan / Recommendation   07/02/2022   2:00 PM  Clinical Impressions Clinical Impression Pt has generally functional oropharyngeal swallowing, but she does appear to have a prominent CP that might partially interfere with bolus flow but leaving only trace residue within the UES or just above. No aspiration occurs and coughing throughout MBS is not related to airway protection. Of note, the barium tablet stayed briefly in the distal esophagus but did appear to clear with an additional liquid wash (MD not present to confirm). Recommend continuing regular solids and thin liquids as tolerated. SLP Visit Diagnosis Dysphagia, unspecified (R13.10) Impact on safety and function Mild aspiration risk     07/02/2022   2:00 PM Treatment Recommendations Treatment Recommendations No treatment recommended at this time     07/02/2022   2:00 PM Prognosis Prognosis for Safe Diet Advancement Good   07/02/2022   2:00 PM Diet Recommendations SLP Diet Recommendations Regular solids;Thin liquid Liquid Administration via Cup;Straw Medication Administration Whole meds with liquid Compensations  Slow rate;Small sips/bites;Minimize environmental distractions Postural Changes Seated upright at 90 degrees;Remain semi-upright after after feeds/meals (Comment)     07/02/2022   2:00 PM Other Recommendations Oral Care Recommendations Oral care BID Follow Up Recommendations No SLP follow up Assistance recommended at discharge PRN Functional Status Assessment Patient has not had a recent decline in their functional status   07/02/2022  11:50 AM Frequency and Duration  Speech Therapy Frequency (ACUTE ONLY) --     07/02/2022   2:00 PM Oral Phase Oral Phase [facility]    07/02/2022   2:00 PM Pharyngeal Phase Pharyngeal Phase [facility]    07/02/2022   2:00 PM Cervical Esophageal Phase  Cervical Esophageal Phase Impaired Thin Cup Reduced cricopharyngeal relaxation;Prominent cricopharyngeal segment Thin Straw Reduced cricopharyngeal relaxation;Prominent cricopharyngeal segment Puree Reduced cricopharyngeal relaxation;Prominent cricopharyngeal segment Regular Reduced cricopharyngeal relaxation;Prominent cricopharyngeal segment Pill Reduced cricopharyngeal relaxation;Prominent cricopharyngeal segment Osie Bond., M.A. [place] Office [phone] Secure chat preferred 07/02/2022, 3:12 PM                     VAS [name] TEMPORAL ARTERY BILATERAL  Result Date: 07/02/2022  TEMPORAL ARTERY REPORT Patient Name:  CYENNA REBELLO  Date of Exam:   07/02/2022 Medical Rec #: [id number]       Accession #:    [id number] Date of Birth: 06-May-1941        Patient Gender: F Patient Age:   7 years Exam Location:  [facility] Procedure:      VAS [name] BILATERAL Referring Phys: MCNEILL KIRKPATRICK --------------------------------------------------------------------------------  Indications: Left sided temporal headache x3 days. High Risk Factors: Age > 62 yrs and female.  Performing Technologist: Archie Patten RVS  Examination Guidelines: Patient in reclined position. 2D, color and spectral doppler sampling in the  temporal artery along the hairline and temple in the longitudinal plane. 2D images along the hairline and temple in the transverse plane. Exam is bilateral.  Summary: Absence of a "halo" sign in the bilateral temporal artery, although not definitive, makes a diagnosis of temporal arteritis unlikely.  *See table(s) above for measurements and observations.  Electronically signed by Antony Contras MD on 07/02/2022 at 2:26:10 PM.   Final    MR Brain W and Wo Contrast  Result Date: 07/02/2022 CLINICAL DATA:  Headache. EXAM: MRI HEAD WITHOUT AND WITH CONTRAST TECHNIQUE: Multiplanar, multiecho pulse sequences of the brain and surrounding structures were obtained without and with intravenous contrast. CONTRAST:  6 mL Vueway COMPARISON:  Head CT, CTA, and CTP 07/01/2022.  Head MRI 11/12/2015. FINDINGS:  Postcontrast sequences are mildly motion degraded. Brain: There is no evidence of an acute infarct, intracranial hemorrhage, mass, midline shift, or extra-axial fluid collection. The ventricles and sulci are within normal limits for age. T2 hyperintensities in the cerebral white matter and pons have progressed from 2017 and are nonspecific but compatible with mild chronic small vessel ischemic disease. No abnormal enhancement is identified. Vascular: Major intracranial vascular flow voids are preserved. Skull and upper cervical spine: Unremarkable bone marrow signal. Sinuses/Orbits: Bilateral cataract extraction. Mucosal thickening and small volume fluid in the right maxillary sinus. Trace left mastoid fluid. Other: None. IMPRESSION: 1. No acute intracranial abnormality. 2. Mild chronic small vessel ischemic disease. Electronically Signed   By: Logan Bores M.D.   On: 07/02/2022 13:37   DG Chest Port 1 View  Result Date: 07/02/2022 CLINICAL DATA:  Headache, cough, pneumonia. EXAM: PORTABLE CHEST 1 VIEW COMPARISON:  07/01/2022 FINDINGS: There is a left chest wall pacer device with lead in the right atrial appendage and  right ventricle. Normal heart size. Aortic atherosclerotic calcifications. Persistent airspace disease within the right upper lobe is not significantly changed from previous exam. IMPRESSION: Persistent right upper lobe airspace disease compatible with pneumonia. Electronically Signed   By: Kerby Moors M.D.   On: 07/02/2022 06:28   CT VENOGRAM HEAD  Result Date: 07/01/2022 CLINICAL DATA:  Headache, classic migraine headache; Headache, tension-type EXAM: CT ANGIOGRAPHY HEAD CT VENOGRAM HEAD TECHNIQUE: Multidetector CT imaging of the head was performed using the standard protocol during bolus administration of intravenous contrast. Venographic phase images of the brain were obtained following the administration of intravenous contrast. Multiplanar reformats and maximum intensity projections were generated. RADIATION DOSE REDUCTION: This exam was performed according to the departmental dose-optimization program which includes automated exposure control, adjustment of the mA and/or kV according to patient size and/or use of iterative reconstruction technique. CONTRAST:  56m OMNIPAQUE IOHEXOL 350 MG/ML SOLN COMPARISON:  Same day CT head. FINDINGS: CTA: Anterior circulation: Bilateral intracranial ICAs, MCAs, and ACAs are patent without proximal hemodynamically significant stenosis. No aneurysm identified. Posterior circulation: Left intradural vertebral artery is non dominant and terminates as PICA, anatomic variant. Right intradural vertebral artery, basilar artery and bilateral posterior cerebral arteries are patent without proximal hemodynamically significant stenosis. No aneurysm identified CTV: No evidence of dural venous sinus thrombosis. The superior sagittal, transverse and sigmoid sinuses are patent. Narrowing of the left transverse sinus. The straight sinus and visualized deep cerebral veins are patent IMPRESSION: 1. No evidence of large vessel occlusion or proximal hemodynamically significant stenosis.  No aneurysm identified. 2. No evidence of dural venous sinus thrombosis. Electronically Signed   By: FMargaretha SheffieldM.D.   On: 07/01/2022 15:04   CT Angio Head W or Wo Contrast  Result Date: 07/01/2022 CLINICAL DATA:  Headache, classic migraine headache; Headache, tension-type EXAM: CT ANGIOGRAPHY HEAD CT VENOGRAM HEAD TECHNIQUE: Multidetector CT imaging of the head was performed using the standard protocol during bolus administration of intravenous contrast. Venographic phase images of the brain were obtained following the administration of intravenous contrast. Multiplanar reformats and maximum intensity projections were generated. RADIATION DOSE REDUCTION: This exam was performed according to the departmental dose-optimization program which includes automated exposure control, adjustment of the mA and/or kV according to patient size and/or use of iterative reconstruction technique. CONTRAST:  774mOMNIPAQUE IOHEXOL 350 MG/ML SOLN COMPARISON:  Same day CT head. FINDINGS: CTA: Anterior circulation: Bilateral intracranial ICAs, MCAs, and ACAs are patent without proximal hemodynamically significant stenosis. No aneurysm identified. Posterior  circulation: Left intradural vertebral artery is non dominant and terminates as PICA, anatomic variant. Right intradural vertebral artery, basilar artery and bilateral posterior cerebral arteries are patent without proximal hemodynamically significant stenosis. No aneurysm identified CTV: No evidence of dural venous sinus thrombosis. The superior sagittal, transverse and sigmoid sinuses are patent. Narrowing of the left transverse sinus. The straight sinus and visualized deep cerebral veins are patent IMPRESSION: 1. No evidence of large vessel occlusion or proximal hemodynamically significant stenosis. No aneurysm identified. 2. No evidence of dural venous sinus thrombosis. Electronically Signed   By: Margaretha Sheffield M.D.   On: 07/01/2022 15:04   CT Head Wo  Contrast  Result Date: 07/01/2022 CLINICAL DATA:  Left-sided headache.  Cough. EXAM: CT HEAD WITHOUT CONTRAST TECHNIQUE: Contiguous axial images were obtained from the base of the skull through the vertex without intravenous contrast. RADIATION DOSE REDUCTION: This exam was performed according to the departmental dose-optimization program which includes automated exposure control, adjustment of the mA and/or kV according to patient size and/or use of iterative reconstruction technique. COMPARISON:  05/07/2016. FINDINGS: Brain: No evidence of acute infarction, hemorrhage, hydrocephalus, extra-axial collection or mass lesion/mass effect. Vascular: No hyperdense vessel or unexpected calcification. Skull: Normal. Negative for fracture or focal lesion. Sinuses/Orbits: Visualized globes and orbits are unremarkable. The visualized sinuses are clear. Other: None. IMPRESSION: Normal unenhanced CT scan of the brain. Electronically Signed   By: Lajean Manes M.D.   On: 07/01/2022 12:28   DG Chest 2 View  Result Date: 07/01/2022 CLINICAL DATA:  Headache, cough, malaise, hypertension EXAM: CHEST - 2 VIEW COMPARISON:  06/12/2018 FINDINGS: Normal heart size, mediastinal contours, and pulmonary vascularity. Atherosclerotic calcification aorta. LEFT subclavian sequential transvenous pacemaker leads project at RIGHT atrium and RIGHT ventricle. Patchy infiltrates identified greatest RIGHT upper lobe consistent with pneumonia. No pleural effusion or pneumothorax. BILATERAL breast prostheses. Bones demineralized. IMPRESSION: RIGHT upper lobe infiltrate consistent with pneumonia. Aortic Atherosclerosis (ICD10-I70.0). Electronically Signed   By: Lavonia Dana M.D.   On: 07/01/2022 10:09

## 2022-07-03 NOTE — Plan of Care (Signed)
?  Problem: Clinical Measurements: ?Goal: Ability to maintain a body temperature in the normal range will improve ?Outcome: Progressing ?  ?Problem: Respiratory: ?Goal: Ability to maintain adequate ventilation will improve ?Outcome: Progressing ?  ?Problem: Respiratory: ?Goal: Ability to maintain a clear airway will improve ?Outcome: Progressing ?  ?

## 2022-07-03 NOTE — Progress Notes (Signed)
PT Cancellation Note  Patient Details Name: Meghan Oliver MRN: [id number] DOB: 28-Aug-1941   Cancelled Treatment:    Reason Eval/Treat Not Completed: Other (comment)  Patient just returning to bed with nursing assist. Will return later in pm   [place]  Office [phone]   Rexanne Mano 07/03/2022, 12:58 PM

## 2022-07-03 NOTE — Evaluation (Signed)
Physical Therapy Evaluation Patient Details Name: DEMARI KROPP MRN: [id number] DOB: 1940-12-02 Today's Date: 07/03/2022  History of Present Illness  Pt is an 81 year old female presented to [facility] with 3 day history of cough, chills and a severe headache. +pna;  Right THA on 05/18/22.  PMHx significant for but not limited to: memory loss, bipolar II, PPM, CHF, chronic low back pain, HTN  Clinical Impression  Pt admitted with/for dx/events above.  Pt scheduled for temporal artery bx 10/2.  Pt presently needing supervision for bed mobility, but minimal to mod assist for buckling with no warning during ambulation.  Pt currently limited functionally due to the problems listed below.  (see problems list.)  Pt will benefit from PT to maximize function and safety to be able to get home safely with available assist .        Recommendations for follow up therapy are one component of a multi-disciplinary discharge planning process, led by the attending physician.  Recommendations may be updated based on patient status, additional functional criteria and insurance authorization.  Follow Up Recommendations Home health PT      Assistance Recommended at Discharge Intermittent Supervision/Assistance  Patient can return home with the following  A little help with walking and/or transfers;A little help with bathing/dressing/bathroom;Assistance with cooking/housework;Direct supervision/assist for medications management;Direct supervision/assist for financial management;Assist for transportation;Help with stairs or ramp for entrance    Equipment Recommendations None recommended by PT  Recommendations for Other Services       Functional Status Assessment Patient has had a recent decline in their functional status and demonstrates the ability to make significant improvements in function in a reasonable and predictable amount of time.     Precautions / Restrictions Precautions Precautions: Fall       Mobility  Bed Mobility Overal bed mobility: Needs Assistance Bed Mobility: Sit to Supine     Supine to sit: Supervision          Transfers Overall transfer level: Needs assistance Equipment used: 1 person hand held assist Transfers: Sit to/from Stand Sit to Stand: Min guard (min assist from the lower surface toilet)           General transfer comment: cues for hand placement    Ambulation/Gait Ambulation/Gait assistance: Min assist Gait Distance (Feet): 15 Feet Assistive device: Rolling walker (2 wheels) Gait Pattern/deviations: Step-through pattern   Gait velocity interpretation: <1.8 ft/sec, indicate of risk for recurrent falls   General Gait Details: pt buckled severe x2 without warning on return to the bed from the bathroom, but otherwise generally min assist  Max to recover from buckling.  Stairs            Wheelchair Mobility    Modified Rankin (Stroke Patients Only)       Balance     Sitting balance-Leahy Scale: Fair       Standing balance-Leahy Scale: Poor                               Pertinent Vitals/Pain Pain Assessment Pain Assessment: Faces Pain Score: 0-No pain Pain Intervention(s): Monitored during session, Limited activity within patient's tolerance    Home Living Family/patient expects to be discharged to:: Private residence Living Arrangements: Spouse/significant other Available Help at Discharge: Other (Comment) (24 hour nursing) Type of Home: House Home Access: Level entry       Home Layout: One level Home Equipment: Conservation officer, nature (2 wheels);Cane - single  point;Hand held shower head;Grab bars - tub/shower;Shower seat      Prior Function Prior Level of Function : Independent/Modified Independent             Mobility Comments: had recently progressed from [facility] to [facility] ADLs Comments: No assist with ADL.  Spouse handles community mobility, finances.  They have someone for home management.     Hand  Dominance   Dominant Hand: Right    Extremity/Trunk Assessment   Upper Extremity Assessment Upper Extremity Assessment: Overall WFL for tasks assessed    Lower Extremity Assessment Lower Extremity Assessment: Generalized weakness    Cervical / Trunk Assessment Cervical / Trunk Assessment: Normal  Communication   Communication: No difficulties  Cognition Arousal/Alertness: Awake/alert Behavior During Therapy: Restless Overall Cognitive Status: Impaired/Different from baseline                   Orientation Level: Person, Time Current Attention Level: Sustained Memory: Decreased short-term memory Following Commands: Follows one step commands with increased time   Awareness: Intellectual Problem Solving: Slow processing, Difficulty sequencing, Requires verbal cues          General Comments General comments (skin integrity, edema, etc.): vss,  no report of HA when asked this session    Exercises     Assessment/Plan    PT Assessment Patient needs continued PT services  PT Problem List Decreased strength;Decreased activity tolerance;Decreased balance;Decreased mobility;Decreased knowledge of use of DME;Decreased safety awareness       PT Treatment Interventions DME instruction;Gait training;Functional mobility training;Therapeutic activities;Balance training;Patient/family education    PT Goals (Current goals can be found in the Care Plan section)  Acute Rehab PT Goals Patient Stated Goal: pt unable PT Goal Formulation: Patient unable to participate in goal setting Time For Goal Achievement: 07/17/22 Potential to Achieve Goals: Good    Frequency Min 3X/week     Co-evaluation               AM-PAC PT "6 Clicks" Mobility  Outcome Measure Help needed turning from your back to your side while in a flat bed without using bedrails?: A Little Help needed moving from lying on your back to sitting on the side of a flat bed without using bedrails?: A  Little Help needed moving to and from a bed to a chair (including a wheelchair)?: A Little Help needed standing up from a chair using your arms (e.g., wheelchair or bedside chair)?: A Little Help needed to walk in hospital room?: A Lot Help needed climbing 3-5 steps with a railing? : A Lot 6 Click Score: 16    End of Session   Activity Tolerance: Patient tolerated treatment well;Patient limited by fatigue Patient left: in bed;with call bell/phone within reach;with nursing/sitter in room Nurse Communication: Mobility status PT Visit Diagnosis: Unsteadiness on feet (R26.81);Other abnormalities of gait and mobility (R26.89);Muscle weakness (generalized) (M62.81)    Time: 1740-8144 PT Time Calculation (min) (ACUTE ONLY): 19 min   Charges:   PT Evaluation $PT Eval Moderate Complexity: 1 Mod          07/03/2022  Ginger Carne., PT Acute Rehabilitation Services [phone]  (office)  Tessie Fass Ilean Spradlin 07/03/2022, 6:19 PM

## 2022-07-04 ENCOUNTER — Inpatient Hospital Stay (HOSPITAL_COMMUNITY): Payer: Medicare Other

## 2022-07-04 DIAGNOSIS — R519 Headache, unspecified: Secondary | ICD-10-CM | POA: Diagnosis not present

## 2022-07-04 DIAGNOSIS — J189 Pneumonia, unspecified organism: Secondary | ICD-10-CM | POA: Diagnosis not present

## 2022-07-04 DIAGNOSIS — I10 Essential (primary) hypertension: Secondary | ICD-10-CM | POA: Diagnosis not present

## 2022-07-04 DIAGNOSIS — R700 Elevated erythrocyte sedimentation rate: Secondary | ICD-10-CM | POA: Diagnosis not present

## 2022-07-04 LAB — CBC WITH DIFFERENTIAL/PLATELET
Abs Immature Granulocytes: 0.02 10*3/uL (ref 0.00–0.07)
Basophils Absolute: 0 10*3/uL (ref 0.0–0.1)
Basophils Relative: 0 %
Eosinophils Absolute: 0.2 10*3/uL (ref 0.0–0.5)
Eosinophils Relative: 2 %
HCT: 26.7 % — ABNORMAL LOW (ref 36.0–46.0)
Hemoglobin: 8.8 g/dL — ABNORMAL LOW (ref 12.0–15.0)
Immature Granulocytes: 0 %
Lymphocytes Relative: 14 %
Lymphs Abs: 0.9 10*3/uL (ref 0.7–4.0)
MCH: 33.2 pg (ref 26.0–34.0)
MCHC: 33 g/dL (ref 30.0–36.0)
MCV: 100.8 fL — ABNORMAL HIGH (ref 80.0–100.0)
Monocytes Absolute: 1 10*3/uL (ref 0.1–1.0)
Monocytes Relative: 16 %
Neutro Abs: 4.4 10*3/uL (ref 1.7–7.7)
Neutrophils Relative %: 68 %
Platelets: 172 10*3/uL (ref 150–400)
RBC: 2.65 MIL/uL — ABNORMAL LOW (ref 3.87–5.11)
RDW: 14.2 % (ref 11.5–15.5)
WBC: 6.6 10*3/uL (ref 4.0–10.5)
nRBC: 0 % (ref 0.0–0.2)

## 2022-07-04 LAB — BRAIN NATRIURETIC PEPTIDE: B Natriuretic Peptide: 443.2 pg/mL — ABNORMAL HIGH (ref 0.0–100.0)

## 2022-07-04 LAB — COMPREHENSIVE METABOLIC PANEL
ALT: 214 U/L — ABNORMAL HIGH (ref 0–44)
AST: 200 U/L — ABNORMAL HIGH (ref 15–41)
Albumin: 2.2 g/dL — ABNORMAL LOW (ref 3.5–5.0)
Alkaline Phosphatase: 72 U/L (ref 38–126)
Anion gap: 4 — ABNORMAL LOW (ref 5–15)
BUN: 14 mg/dL (ref 8–23)
CO2: 18 mmol/L — ABNORMAL LOW (ref 22–32)
Calcium: 8 mg/dL — ABNORMAL LOW (ref 8.9–10.3)
Chloride: 111 mmol/L (ref 98–111)
Creatinine, Ser: 0.7 mg/dL (ref 0.44–1.00)
GFR, Estimated: >60 mL/min (ref >60)
Glucose, Bld: 88 mg/dL (ref 70–99)
Potassium: 3.9 mmol/L (ref 3.5–5.1)
Sodium: 133 mmol/L — ABNORMAL LOW (ref 135–145)
Total Bilirubin: 0.4 mg/dL (ref 0.3–1.2)
Total Protein: 5.5 g/dL — ABNORMAL LOW (ref 6.5–8.1)

## 2022-07-04 LAB — SODIUM, URINE, RANDOM: Sodium, Ur: 16 mmol/L

## 2022-07-04 LAB — MAGNESIUM: Magnesium: 2.1 mg/dL (ref 1.7–2.4)

## 2022-07-04 LAB — PROTIME-INR
INR: 1.2 (ref 0.8–1.2)
Prothrombin Time: 15.1 seconds (ref 11.4–15.2)

## 2022-07-04 LAB — CREATININE, URINE, RANDOM: Creatinine, Urine: 77 mg/dL

## 2022-07-04 LAB — URIC ACID: Uric Acid, Serum: 2.9 mg/dL (ref 2.5–7.1)

## 2022-07-04 LAB — SEDIMENTATION RATE: Sed Rate: 96 mm/hr — ABNORMAL HIGH (ref 0–22)

## 2022-07-04 LAB — PROCALCITONIN: Procalcitonin: 0.59 ng/mL

## 2022-07-04 MED ORDER — GUAIFENESIN-CODEINE 100-10 MG/5ML PO SOLN
10.0000 mL | Freq: Four times a day (QID) | ORAL | Status: DC | PRN
  Administered 2022-07-04 – 2022-07-07 (×8): 10 mL via ORAL
  Filled 2022-07-04 (×10): qty 10

## 2022-07-04 MED ORDER — NALOXEGOL OXALATE 25 MG PO TABS
25.0000 mg | ORAL_TABLET | Freq: Every day | ORAL | Status: DC | PRN
  Administered 2022-07-04 – 2022-07-05 (×3): 25 mg via ORAL
  Filled 2022-07-04 (×5): qty 1

## 2022-07-04 MED ORDER — ROSUVASTATIN CALCIUM 20 MG PO TABS
20.0000 mg | ORAL_TABLET | Freq: Every day | ORAL | Status: DC
  Administered 2022-07-04 – 2022-07-08 (×5): 20 mg via ORAL
  Filled 2022-07-04 (×4): qty 1

## 2022-07-04 MED ORDER — CEFAZOLIN SODIUM-DEXTROSE 2-4 GM/100ML-% IV SOLN: 2.0000 g | INTRAVENOUS | Status: DC

## 2022-07-04 NOTE — Progress Notes (Signed)
VASCULAR SURGERY:  She is scheduled for a left temporal artery biopsy tomorrow.  I have written preop orders.  Gae Gallop, MD 7:03 AM

## 2022-07-04 NOTE — Progress Notes (Addendum)
PROGRESS NOTE                                                                                                                                                                                                             Patient Demographics:    Meghan Oliver, is a 81 y.o. female, DOB - [birth year], PYP:[id number]  Outpatient Primary MD for the patient is Baxley, Cresenciano Lick, MD    LOS - 2  Admit date - 07/01/2022    Chief Complaint  Patient presents with   Headache       Brief Narrative (HPI from H&P)   81 y.o. female with medical history significant of HTN, HLD, hypothyroidism, OSA, GERD, sinus node dysfunction s/p PPM in 03/2018, bipolar and depression who presented to ED with 3 day history of cough, chills and a severe headache. New intermittent episodes of headache on left temple x 3 days prior to hospitalization of note she had similar headaches about 2 months ago which lasted for a few days.  No photophobia, no jaw claudication, no problems chewing food.  No temporal tenderness.  In the hospital she was diagnosed with pneumonia and admitted to the hospital.  Neurology was consulted for headaches.   Subjective:   Patient in bed, appears comfortable, mild intermittent headaches, no fever, no chest pain or pressure, no shortness of breath , no abdominal pain. No new focal weakness. No vision changes   Assessment  & Plan :    Community acquired pneumonia - she has been started on appropriate antibiotics, has intermittent delirium and a very weak cough reflex increasing the chances of aspiration, will involve speech therapy, continue supportive care with oxygen, I-S and flutter valve for pulmonary toiletry.  Supportive care for cough.  Follow cultures closely.  Pneumonia could explain her elevated ESR and CRP.  However if her inflammatory markers remain significantly high despite appropriate treatment.  Intermittent episodes of left  temporal headache - 3 day history of intermittent left sided temporal headache with some radiation to right side. Non tender on exam, no pain with chewing, no phonophobia/photophobia.  This happened 2 months ago and lasted for a few days, ESR is still significantly high, CT scan head, CT angiogram head and neck and CT venogram nonacute.  Temporal artery ultrasound and MRI brain stable.  Case discussed with neurologist Dr. Leonel Ramsay,  Dr. Alferd Patee, withhold steroids, we will proceed with temporal artery biopsy to definitely rule out or rule in giant cell arteritis, for any steroid use to neurology, Vascular surgery consulted for temporal artery biopsy - likely 07/05/22.  Elevated troponin - She has no chest pain or ekg changes, troponin trend flat and not an ACS pattern, continue aspirin and statin for secondary prevention Case discussed with her cardiologist Dr. Loralie Champagne, no further work-up for now.  Macrocytic anemia - Hb nadir of 9.7 after hip surgery in August, iron studies inconclusive, B12 stable, continue to monitor no signs of infection.  Some drop due to hemodilution.  Continue PPI twice daily.  Some fall due to hemodilution from IV fluids.  Continue to monitor.  Bipolar/depression - History of ECT x 30 in past, Continue home medication of paxil, pamelor, remeron and ativan PRN and lithium, stable lithium level.  Sinus node dysfunction (HCC) - S/p [facility] Jude PPM in 03/2018.   Memory loss - Continue Namenda, at risk for delirium and aspiration.  Discussed with her primary nurse and husband on 07/02/2022.  Hyperlipidemia - Continue Crestor.   Hypothyroidism - TSH wnl in 03/2022, Continue home synthroid.   Hyponatremia - likely SIADH, hold IVF, check urine electrolytes.  Chronic constipation - Continue home senokot, linzess, miralaz prn, also has movik prn, giving a dose on 07/04/2022.   GERD - Continue PPI.   OSA (obstructive sleep apnea) - Not on cpap, non compliant    Hypomagnesemia.   Replaced.  Asymptomatic transaminitis.  Have been elevated for a few months, could have been higher due to some hepatic congestion from IV fluids, is on Crestor, will also review her psych medications, will check INR to check synthetic function, total bilirubin stable, checking right upper quadrant ultrasound.        Condition - Extremely Guarded  Family Communication  :    Updated personal nurse at bedside in detail on 07/02/2022, 07/03/22.  Updated spouse Adilene Areola [phone] on 07/02/2022, 07/03/22 message left at 9:43 AM, 07/04/22  Code Status :  Full  Consults  :  Neuro  PUD Prophylaxis : PPI   Procedures  :     MRI - 1. No acute intracranial abnormality. 2. Mild chronic small vessel ischemic disease  [name] Temp Artery - Stable  CT scan head, CT angiogram head and neck and CT venogram nonacute      Disposition Plan  :    Status is: Observation  DVT Prophylaxis  :    enoxaparin (LOVENOX) injection 30 mg Start: 07/01/22 1915   Lab Results  Component Value Date   PLT 172 07/04/2022    Diet :  Diet Order             Diet NPO time specified Except for: Sips with Meds  Diet effective midnight           Diet regular Room service appropriate? Yes; Fluid consistency: Thin  Diet effective now                    Inpatient Medications  Scheduled Meds:  aspirin  81 mg Oral Daily   benzonatate  100 mg Oral TID   cyanocobalamin  1,000 mcg Oral QHS   enoxaparin (LOVENOX) injection  30 mg Subcutaneous Q24H   fesoterodine  8 mg Oral Daily   fluticasone  2 spray Each Nare Daily   gabapentin  300 mg Oral BID   levothyroxine  75 mcg Oral Q0600   linaclotide  72 mcg Oral BID   lithium carbonate  300 mg Oral QHS   loratadine  10 mg Oral Daily   memantine  10 mg Oral BID   mirtazapine  7.5 mg Oral QHS   nortriptyline  75 mg Oral QHS   pantoprazole  40 mg Oral BID AC   PARoxetine  20 mg Oral QHS   primidone  100 mg Oral q AM   primidone  50 mg Oral QHS    vitamin B-6  500 mg Oral BID   rosuvastatin  40 mg Oral Daily   senna-docusate  2 tablet Oral BID   Continuous Infusions:  sodium chloride 75 mL/hr at 07/03/22 0317   azithromycin (ZITHROMAX) 500 mg in sodium chloride 0.9 % 250 mL IVPB 500 mg (07/03/22 1000)   cefTRIAXone (ROCEPHIN)  IV 1 g (07/03/22 0929)   PRN Meds:.acetaminophen **OR** acetaminophen, albuterol, guaiFENesin-dextromethorphan, LORazepam, LORazepam, naloxegol oxalate, ondansetron (ZOFRAN) IV, polyethylene glycol, traMADol  Antibiotics  :    Anti-infectives (From admission, onward)    Start     Dose/Rate Route Frequency Ordered Stop   07/05/22 0900  ceFAZolin (ANCEF) IVPB 2g/100 mL premix  Status:  Discontinued       Note to Pharmacy: Send with pt to OR   2 g 200 mL/hr over 30 Minutes Intravenous On call 07/04/22 0704 07/04/22 0850   07/02/22 1000  azithromycin (ZITHROMAX) 500 mg in sodium chloride 0.9 % 250 mL IVPB        500 mg 250 mL/hr over 60 Minutes Intravenous Daily 07/01/22 2352     07/02/22 1000  cefTRIAXone (ROCEPHIN) 1 g in sodium chloride 0.9 % 100 mL IVPB        1 g 200 mL/hr over 30 Minutes Intravenous Daily 07/01/22 2352     07/01/22 1130  cefTRIAXone (ROCEPHIN) 1 g in sodium chloride 0.9 % 100 mL IVPB        1 g 200 mL/hr over 30 Minutes Intravenous  Once 07/01/22 1121 07/01/22 1348   07/01/22 1130  azithromycin (ZITHROMAX) 500 mg in sodium chloride 0.9 % 250 mL IVPB        500 mg 250 mL/hr over 60 Minutes Intravenous  Once 07/01/22 1121 07/01/22 1348        Time Spent in minutes  30   Lala Lund M.D on 07/04/2022 at 8:51 AM  To page go to [email or web address]   [facility] -  Office  [phone]  See all Orders from today for further details    Objective:   Vitals:   07/03/22 2002 07/03/22 2331 07/04/22 0333 07/04/22 0800  BP: (!) 124/57 (!) 112/54 (!) 107/53 (!) 126/56  Pulse: 67 66 65 71  Resp: _0 Temp: 99.7 F (37.6 C) 98.7 F (37.1 C) 98.7 F (37.1 C) 98.3 F  (36.8 C)  TempSrc: Oral Axillary Axillary Axillary  SpO2: 91% 94% 94% 99%  Weight:      Height:        Wt Readings from Last 3 Encounters:  07/01/22 42.6 kg  05/18/22 42.6 kg  03/31/22 44.7 kg     Intake/Output Summary (Last 24 hours) at 07/04/2022 0851 Last data filed at 07/03/2022 1316 Gross per 24 hour  Intake --  Output 200 ml  Net -200 ml     Physical Exam  Awake Alert, No new F.N deficits, Normal affect [place].AT,PERRAL Supple Neck, No JVD,   Symmetrical Chest wall movement, Good air movement bilaterally, CTAB RRR,No Gallops, Rubs or  new Murmurs,  +ve B.Sounds, Abd Soft, No tenderness,   No Cyanosis, Clubbing or edema     Data Review:    CBC Recent Labs  Lab 07/01/22 1140 07/02/22 0737 07/03/22 0652 07/04/22 0625  WBC 10.8* 8.9 7.5 6.6  HGB 10.1* 9.1* 8.9* 8.8*  HCT 32.3* 29.0* 26.9* 26.7*  PLT 179 174 183 172  MCV 103.2* 103.6* 100.0 100.8*  MCH 32.3 32.5 33.1 33.2  MCHC 31.3 31.4 33.1 33.0  RDW 13.4 13.4 13.7 14.2  LYMPHSABS 0.8  --  1.0 0.9  MONOABS 0.6  --  1.0 1.0  EOSABS 0.1  --  0.1 0.2  BASOSABS 0.0  --  0.0 0.0    Electrolytes Recent Labs  Lab 07/01/22 1140 07/01/22 1618 07/01/22 1818 07/01/22 2052 07/02/22 0737 07/03/22 0652 07/04/22 0625  NA 136  --   --   --  135 135 133*  K 4.2  --   --   --  3.9 4.0 3.9  CL 105  --   --   --  107 109 111  CO2 22  --   --   --  24 19* 18*  GLUCOSE 86  --   --   --  102* 101* 88  BUN 24*  --   --   --  _0 CREATININE 0.74  --   --   --  0.74 0.70 0.70  CALCIUM 9.0  --   --   --  8.0* 8.1* 8.0*  AST  --   --   --   --   --  133* 200*  ALT  --   --   --   --   --  170* 214*  ALKPHOS  --   --   --   --   --  62 72  BILITOT  --   --   --   --   --  0.4 0.4  ALBUMIN  --   --   --   --   --  2.4* 2.2*  MG  --   --   --   --  1.6* 2.5* 2.1  CRP  --  18.3* 18.1*  --   --  16.7*  --   PROCALCITON  --   --   --  2.05 1.51 0.94 0.59  BNP  --   --   --  206.2*  --  510.5* 443.2*     ------------------------------------------------------------------------------------------------------------------ No results for input(s): "CHOL", "HDL", "LDLCALC", "TRIG", "CHOLHDL", "LDLDIRECT" in the last 72 hours.  Lab Results  Component Value Date   HGBA1C 5.1 02/15/2011    No results for input(s): "TSH", "T4TOTAL", "T3FREE", "THYROIDAB" in the last 72 hours.  Invalid input(s): "FREET3" ------------------------------------------------------------------------------------------------------------------ ID Labs Recent Labs  Lab 07/01/22 1140 07/01/22 1618 07/01/22 1818 07/01/22 2052 07/02/22 0737 07/03/22 0652 07/04/22 0625  WBC 10.8*  --   --   --  8.9 7.5 6.6  PLT 179  --   --   --  174 183 172  CRP  --  18.3* 18.1*  --   --  16.7*  --   PROCALCITON  --   --   --  2.05 1.51 0.94 0.59  CREATININE 0.74  --   --   --  0.74 0.70 0.70   Radiology Reports DG Swallowing Func-Speech Pathology  Result Date: 07/02/2022 Table formatting from the original result was not included. Objective Swallowing Evaluation: Type of Study: MBS-Modified Barium  Swallow Study  Patient Details Name: AMEENAH PROSSER MRN: [id number] Date of Birth: [birth year] Today's Date: 07/02/2022 Time: SLP Start Time (ACUTE ONLY): 0737 -SLP Stop Time (ACUTE ONLY): 1062 SLP Time Calculation (min) (ACUTE ONLY): 17 min Past Medical History: Past Medical History: Diagnosis Date  Anxiety   Arthritis   "hips, spine" (03/17/2018)  Bipolar II disorder (HCC)   CHF (congestive heart failure) (HCC)   Chronic bronchitis (HCC)   Chronic lower back pain   Chronic right hip pain   CKD (chronic kidney disease), stage II   Coronary artery disease   stent x1  Esophagitis, erosive   GAD (generalized anxiety disorder)   GERD (gastroesophageal reflux disease)   Headache   "maybe monthly" (03/17/2018))  Heart murmur, systolic   History of adenomatous polyp of colon   08-04-2016  tubular adenoma  History of blood transfusion 12/2017  "related to  vascular hematoma"  History of electroconvulsive therapy   at [facility]--  started 04-15-2015 to 11-17-2016  total greater than 40 times  History of hiatal hernia   Hyperlipidemia   Hypertension   Hypothyroidism   Internal carotid artery stenosis, bilateral   per last duplex 05-01-2014  bilateral ICA 40-59%  Major depression, chronic   ECT treatments extensive and multiple started 07/ 2016  Memory loss   "both short and long-term; needs frequent reminders to follow instrucitons" (05/16/2017)  Migraines   "none in years" (03/17/2018)  OSA (obstructive sleep apnea)   per study 06/ 2012 moderate OSA  ; "refuses to wear masks" (03/17/2018)  Osteoporosis   Poor historian   due to short term memory loss  Presence of permanent cardiac pacemaker 03/17/2018  Pulmonary nodule   monitored by pcp  S/P placement of cardiac pacemaker 03/17/18 [facility]  03/18/2018  Short-term memory loss   Sick sinus syndrome ([place])  Past Surgical History: Past Surgical History: Procedure Laterality Date  APPENDECTOMY  1971  AUGMENTATION MAMMAPLASTY Bilateral   CARDIOVASCULAR STRESS TEST  01-30-2015  dr Aundra Dubin  Low risk nuclear study w/ no evidence ischemia or infarction/  normal LV funciton and wall motion , 76%  COLONOSCOPY W/ BIOPSIES AND POLYPECTOMY  "multiple"  COLONOSCOPY WITH ESOPHAGOGASTRODUODENOSCOPY (EGD)  last one 08-04-2016  CORONARY ANGIOPLASTY WITH STENT PLACEMENT  05/16/2017  "LAD"  CORONARY STENT INTERVENTION N/A 05/16/2017  Procedure: CORONARY STENT INTERVENTION;  Surgeon: Burnell Blanks, MD;  Location: [facility];  Service: Cardiovascular;  Laterality: N/A;  ESOPHAGOGASTRODUODENOSCOPY  02-26-04  ESOPHAGOGASTRODUODENOSCOPY  "multiple"  INSERT / REPLACE / REMOVE PACEMAKER  03/17/2018  LEFT HEART CATH AND CORONARY ANGIOGRAPHY N/A 05/16/2017  Procedure: LEFT HEART CATH AND CORONARY ANGIOGRAPHY;  Surgeon: Larey Dresser, MD;  Location: [place] [facility];  Service: Cardiovascular;  Laterality: N/A;  OVARIAN CYST SURGERY  1970s   Laparotomy   PACEMAKER IMPLANT N/A 03/17/2018  Procedure: PACEMAKER IMPLANT;  Surgeon: Evans Lance, MD;  Location: [facility];  Service: Cardiovascular;  Laterality: N/A;  PORT-A-CATH PLACEMENT  05/31/2016; 2018  "@ [facility]; for ECT series"; "@ [facility] also"  PORT-A-CATH REMOVAL N/A 03/11/2017  Procedure: REMOVAL PORT-A-CATH;  Surgeon: Jackolyn Confer, MD;  Location: [facility];  Service: General;  Laterality: N/A;  PORTA CATH REMOVAL  03/17/2018  PORTA CATH REMOVAL  03/17/2018  Procedure: PORTA CATH REMOVAL;  Surgeon: Evans Lance, MD;  Location: [place] [facility];  Service: Cardiovascular;;  TOTAL HIP ARTHROPLASTY Right 05/18/2022  Procedure: TOTAL HIP ARTHROPLASTY ANTERIOR APPROACH;  Surgeon: Paralee Cancel, MD;  Location: [facility];  Service: Orthopedics;  Laterality: Right;  TRANSTHORACIC ECHOCARDIOGRAM  09/05/2013  dr Aundra Dubin  mild LVH, ef 15-37%, grade 1 diastolic dysfunction/  very mild AV stenosis with mild AR/  trivial MR and PT/ mild to moderate LAE/ mild TR/ mild pulmonary hypertension with PA peak pressure 41mHg  TUBAL LIGATION   HPI: 81y.o. female who presented to ED with 3 day history of cough, chills and a severe headache. New intermittent episodes of headache on left temple x 3 days prior to hospitalization of note she had similar headaches about 2 months ago which lasted for a few days.  In the hospital she was diagnosed with pneumonia and admitted to the hospital.  Neurology was consulted for headaches. Pt with medical history significant of HTN, HLD, hypothyroidism, OSA, GERD, sinus node dysfunction s/p PPM in 03/2018, bipolar and depression. Caregiver reports hx esophageal dilation x2.  Subjective: awake, cooperative but also confused, asking some repetitive questions  Recommendations for follow up therapy are one component of a multi-disciplinary discharge planning process, led by the attending physician.  Recommendations may be updated based on patient status, additional  functional criteria and insurance authorization. Assessment / Plan / Recommendation   07/02/2022   2:00 PM Clinical Impressions Clinical Impression Pt has generally functional oropharyngeal swallowing, but she does appear to have a prominent CP that might partially interfere with bolus flow but leaving only trace residue within the UES or just above. No aspiration occurs and coughing throughout MBS is not related to airway protection. Of note, the barium tablet stayed briefly in the distal esophagus but did appear to clear with an additional liquid wash (MD not present to confirm). Recommend continuing regular solids and thin liquids as tolerated. SLP Visit Diagnosis Dysphagia, unspecified (R13.10) Impact on safety and function Mild aspiration risk     07/02/2022   2:00 PM Treatment Recommendations Treatment Recommendations No treatment recommended at this time     07/02/2022   2:00 PM Prognosis Prognosis for Safe Diet Advancement Good   07/02/2022   2:00 PM Diet Recommendations SLP Diet Recommendations Regular solids;Thin liquid Liquid Administration via Cup;Straw Medication Administration Whole meds with liquid Compensations Slow rate;Small sips/bites;Minimize environmental distractions Postural Changes Seated upright at 90 degrees;Remain semi-upright after after feeds/meals (Comment)     07/02/2022   2:00 PM Other Recommendations Oral Care Recommendations Oral care BID Follow Up Recommendations No SLP follow up Assistance recommended at discharge PRN Functional Status Assessment Patient has not had a recent decline in their functional status   07/02/2022  11:50 AM Frequency and Duration  Speech Therapy Frequency (ACUTE ONLY) --     07/02/2022   2:00 PM [facility]   07/02/2022   2:00 PM Pharyngeal [facility]   07/02/2022   2:00 PM Cervical Esophageal Phase  Cervical Esophageal Phase Impaired Thin Cup Reduced cricopharyngeal relaxation;Prominent cricopharyngeal segment Thin Straw Reduced  cricopharyngeal relaxation;Prominent cricopharyngeal segment Puree Reduced cricopharyngeal relaxation;Prominent cricopharyngeal segment Regular Reduced cricopharyngeal relaxation;Prominent cricopharyngeal segment Pill Reduced cricopharyngeal relaxation;Prominent cricopharyngeal segment LOsie Bond, M.A. CHaskellOffice ([phone] chat preferred 07/02/2022, 3:12 PM                     VAS [place] ART[place] BILATERAL  Result Date: 07/02/2022  TEMPORAL ARTERY REPORT Patient Name:  JBRYN SALINE Date of Exam:   07/02/2022 Medical Rec #: [id number]      Accession #:  [id number] Date of Birth: [birth year]        Patient Gender: F Patient Age:   44 years Exam [facility]  [facility] Procedure:      VAS [name] ARTERY BILATERAL Referring Phys: MCNEILL KIRKPATRICK --------------------------------------------------------------------------------  Indications: Left sided temporal headache x3 days. High Risk Factors: Age > 12 yrs and female.  Performing Technologist: Archie Patten RVS  Examination Guidelines: Patient in reclined position. 2D, color and spectral doppler sampling in the temporal artery along the hairline and temple in the longitudinal plane. 2D images along the hairline and temple in the transverse plane. Exam is bilateral.  Summary: Absence of a "halo" sign in the bilateral temporal artery, although not definitive, makes a diagnosis of temporal arteritis unlikely.  *See table(s) above for measurements and observations.  Electronically signed by Antony Contras MD on 07/02/2022 at 2:26:10 PM.   Final    MR Brain W and Wo Contrast  Result Date: 07/02/2022 CLINICAL DATA:  Headache. EXAM: MRI HEAD WITHOUT AND WITH CONTRAST TECHNIQUE: Multiplanar, multiecho pulse sequences of the brain and surrounding structures were obtained without and with intravenous contrast. CONTRAST:  6 mL Vueway COMPARISON:  Head CT, CTA, and CTP 07/01/2022.  Head MRI 11/12/2015. FINDINGS:  Postcontrast sequences are mildly motion degraded. Brain: There is no evidence of an acute infarct, intracranial hemorrhage, mass, midline shift, or extra-axial fluid collection. The ventricles and sulci are within normal limits for age. T2 hyperintensities in the cerebral white matter and pons have progressed from 2017 and are nonspecific but compatible with mild chronic small vessel ischemic disease. No abnormal enhancement is identified. Vascular: Major intracranial vascular flow voids are preserved. Skull and upper cervical spine: Unremarkable bone marrow signal. Sinuses/Orbits: Bilateral cataract extraction. Mucosal thickening and small volume fluid in the right maxillary sinus. Trace left mastoid fluid. Other: None. IMPRESSION: 1. No acute intracranial abnormality. 2. Mild chronic small vessel ischemic disease. Electronically Signed   By: Logan Bores M.D.   On: 07/02/2022 13:37   DG Chest Port 1 View  Result Date: 07/02/2022 CLINICAL DATA:  Headache, cough, pneumonia. EXAM: PORTABLE CHEST 1 VIEW COMPARISON:  07/01/2022 FINDINGS: There is a left chest wall pacer device with lead in the right atrial appendage and right ventricle. Normal heart size. Aortic atherosclerotic calcifications. Persistent airspace disease within the right upper lobe is not significantly changed from previous exam. IMPRESSION: Persistent right upper lobe airspace disease compatible with pneumonia. Electronically Signed   By: Kerby Moors M.D.   On: 07/02/2022 06:28   CT VENOGRAM HEAD  Result Date: 07/01/2022 CLINICAL DATA:  Headache, classic migraine headache; Headache, tension-type EXAM: CT ANGIOGRAPHY HEAD CT VENOGRAM HEAD TECHNIQUE: Multidetector CT imaging of the head was performed using the standard protocol during bolus administration of intravenous contrast. Venographic phase images of the brain were obtained following the administration of intravenous contrast. Multiplanar reformats and maximum intensity projections  were generated. RADIATION DOSE REDUCTION: This exam was performed according to the departmental dose-optimization program which includes automated exposure control, adjustment of the mA and/or kV according to patient size and/or use of iterative reconstruction technique. CONTRAST:  31m OMNIPAQUE IOHEXOL 350 MG/ML SOLN COMPARISON:  Same day CT head. FINDINGS: CTA: Anterior circulation: Bilateral intracranial ICAs, MCAs, and ACAs are patent without proximal hemodynamically significant stenosis. No aneurysm identified. Posterior circulation: Left intradural vertebral artery is non dominant and terminates as PICA, anatomic variant. Right intradural vertebral artery, basilar artery and bilateral posterior cerebral arteries are patent without proximal hemodynamically significant stenosis. No aneurysm  identified CTV: No evidence of dural venous sinus thrombosis. The superior sagittal, transverse and sigmoid sinuses are patent. Narrowing of the left transverse sinus. The straight sinus and visualized deep cerebral veins are patent IMPRESSION: 1. No evidence of large vessel occlusion or proximal hemodynamically significant stenosis. No aneurysm identified. 2. No evidence of dural venous sinus thrombosis. Electronically Signed   By: Margaretha Sheffield M.D.   On: 07/01/2022 15:04   CT Angio Head W or Wo Contrast  Result Date: 07/01/2022 CLINICAL DATA:  Headache, classic migraine headache; Headache, tension-type EXAM: CT ANGIOGRAPHY HEAD CT VENOGRAM HEAD TECHNIQUE: Multidetector CT imaging of the head was performed using the standard protocol during bolus administration of intravenous contrast. Venographic phase images of the brain were obtained following the administration of intravenous contrast. Multiplanar reformats and maximum intensity projections were generated. RADIATION DOSE REDUCTION: This exam was performed according to the departmental dose-optimization program which includes automated exposure control,  adjustment of the mA and/or kV according to patient size and/or use of iterative reconstruction technique. CONTRAST:  60m OMNIPAQUE IOHEXOL 350 MG/ML SOLN COMPARISON:  Same day CT head. FINDINGS: CTA: Anterior circulation: Bilateral intracranial ICAs, MCAs, and ACAs are patent without proximal hemodynamically significant stenosis. No aneurysm identified. Posterior circulation: Left intradural vertebral artery is non dominant and terminates as PICA, anatomic variant. Right intradural vertebral artery, basilar artery and bilateral posterior cerebral arteries are patent without proximal hemodynamically significant stenosis. No aneurysm identified CTV: No evidence of dural venous sinus thrombosis. The superior sagittal, transverse and sigmoid sinuses are patent. Narrowing of the left transverse sinus. The straight sinus and visualized deep cerebral veins are patent IMPRESSION: 1. No evidence of large vessel occlusion or proximal hemodynamically significant stenosis. No aneurysm identified. 2. No evidence of dural venous sinus thrombosis. Electronically Signed   By: FMargaretha SheffieldM.D.   On: 07/01/2022 15:04   CT Head Wo Contrast  Result Date: 07/01/2022 CLINICAL DATA:  Left-sided headache.  Cough. EXAM: CT HEAD WITHOUT CONTRAST TECHNIQUE: Contiguous axial images were obtained from the base of the skull through the vertex without intravenous contrast. RADIATION DOSE REDUCTION: This exam was performed according to the departmental dose-optimization program which includes automated exposure control, adjustment of the mA and/or kV according to patient size and/or use of iterative reconstruction technique. COMPARISON:  05/07/2016. FINDINGS: Brain: No evidence of acute infarction, hemorrhage, hydrocephalus, extra-axial collection or mass lesion/mass effect. Vascular: No hyperdense vessel or unexpected calcification. Skull: Normal. Negative for fracture or focal lesion. Sinuses/Orbits: Visualized globes and orbits are  unremarkable. The visualized sinuses are clear. Other: None. IMPRESSION: Normal unenhanced CT scan of the brain. Electronically Signed   By: DLajean ManesM.D.   On: 07/01/2022 12:28   DG Chest 2 View  Result Date: 07/01/2022 CLINICAL DATA:  Headache, cough, malaise, hypertension EXAM: CHEST - 2 VIEW COMPARISON:  06/12/2018 FINDINGS: Normal heart size, mediastinal contours, and pulmonary vascularity. Atherosclerotic calcification aorta. LEFT subclavian sequential transvenous pacemaker leads project at RIGHT atrium and RIGHT ventricle. Patchy infiltrates identified greatest RIGHT upper lobe consistent with pneumonia. No pleural effusion or pneumothorax. BILATERAL breast prostheses. Bones demineralized. IMPRESSION: RIGHT upper lobe infiltrate consistent with pneumonia. Aortic Atherosclerosis (ICD10-I70.0). Electronically Signed   By: MLavonia DanaM.D.   On: 07/01/2022 10:09

## 2022-07-04 NOTE — Plan of Care (Signed)
  Problem: Activity: Goal: Ability to tolerate increased activity will improve Outcome: Progressing   Problem: Clinical Measurements: Goal: Ability to maintain a body temperature in the normal range will improve Outcome: Progressing   Problem: Respiratory: Goal: Ability to maintain adequate ventilation will improve Outcome: Progressing   Problem: Respiratory: Goal: Ability to maintain a clear airway will improve Outcome: Progressing   

## 2022-07-05 ENCOUNTER — Ambulatory Visit (INDEPENDENT_AMBULATORY_CARE_PROVIDER_SITE_OTHER): Payer: Medicare Other

## 2022-07-05 ENCOUNTER — Inpatient Hospital Stay (HOSPITAL_COMMUNITY): Payer: Medicare Other | Admitting: Anesthesiology

## 2022-07-05 ENCOUNTER — Other Ambulatory Visit: Payer: Self-pay

## 2022-07-05 ENCOUNTER — Inpatient Hospital Stay (HOSPITAL_COMMUNITY): Payer: Medicare Other

## 2022-07-05 ENCOUNTER — Encounter (HOSPITAL_COMMUNITY): Payer: Self-pay | Admitting: Family Medicine

## 2022-07-05 ENCOUNTER — Encounter (HOSPITAL_COMMUNITY)
Admission: EM | Disposition: A | Discharge status: Home Health | Payer: Self-pay | Source: Home / Self Care | Attending: Internal Medicine

## 2022-07-05 DIAGNOSIS — I10 Essential (primary) hypertension: Secondary | ICD-10-CM | POA: Diagnosis not present

## 2022-07-05 DIAGNOSIS — J189 Pneumonia, unspecified organism: Secondary | ICD-10-CM | POA: Diagnosis not present

## 2022-07-05 DIAGNOSIS — I5022 Chronic systolic (congestive) heart failure: Secondary | ICD-10-CM

## 2022-07-05 DIAGNOSIS — M316 Other giant cell arteritis: Secondary | ICD-10-CM

## 2022-07-05 DIAGNOSIS — R7982 Elevated C-reactive protein (CRP): Secondary | ICD-10-CM

## 2022-07-05 DIAGNOSIS — I2510 Atherosclerotic heart disease of native coronary artery without angina pectoris: Secondary | ICD-10-CM

## 2022-07-05 DIAGNOSIS — Z87891 Personal history of nicotine dependence: Secondary | ICD-10-CM

## 2022-07-05 DIAGNOSIS — R700 Elevated erythrocyte sedimentation rate: Secondary | ICD-10-CM | POA: Diagnosis not present

## 2022-07-05 DIAGNOSIS — I110 Hypertensive heart disease with heart failure: Secondary | ICD-10-CM

## 2022-07-05 DIAGNOSIS — R519 Headache, unspecified: Secondary | ICD-10-CM | POA: Diagnosis not present

## 2022-07-05 HISTORY — PX: ARTERY BIOPSY: SHX891

## 2022-07-05 LAB — CBC WITH DIFFERENTIAL/PLATELET
Abs Immature Granulocytes: 0.05 10*3/uL (ref 0.00–0.07)
Basophils Absolute: 0 10*3/uL (ref 0.0–0.1)
Basophils Relative: 0 %
Eosinophils Absolute: 0.2 10*3/uL (ref 0.0–0.5)
Eosinophils Relative: 2 %
HCT: 25 % — ABNORMAL LOW (ref 36.0–46.0)
Hemoglobin: 8.2 g/dL — ABNORMAL LOW (ref 12.0–15.0)
Immature Granulocytes: 1 %
Lymphocytes Relative: 15 %
Lymphs Abs: 1 10*3/uL (ref 0.7–4.0)
MCH: 32.4 pg (ref 26.0–34.0)
MCHC: 32.8 g/dL (ref 30.0–36.0)
MCV: 98.8 fL (ref 80.0–100.0)
Monocytes Absolute: 0.9 10*3/uL (ref 0.1–1.0)
Monocytes Relative: 14 %
Neutro Abs: 4.5 10*3/uL (ref 1.7–7.7)
Neutrophils Relative %: 68 %
Platelets: 209 10*3/uL (ref 150–400)
RBC: 2.53 MIL/uL — ABNORMAL LOW (ref 3.87–5.11)
RDW: 14.2 % (ref 11.5–15.5)
WBC: 6.6 10*3/uL (ref 4.0–10.5)
nRBC: 0 % (ref 0.0–0.2)

## 2022-07-05 LAB — COMPREHENSIVE METABOLIC PANEL
ALT: 199 U/L — ABNORMAL HIGH (ref 0–44)
AST: 166 U/L — ABNORMAL HIGH (ref 15–41)
Albumin: 2.2 g/dL — ABNORMAL LOW (ref 3.5–5.0)
Alkaline Phosphatase: 74 U/L (ref 38–126)
Anion gap: 6 (ref 5–15)
BUN: 19 mg/dL (ref 8–23)
CO2: 18 mmol/L — ABNORMAL LOW (ref 22–32)
Calcium: 8.5 mg/dL — ABNORMAL LOW (ref 8.9–10.3)
Chloride: 110 mmol/L (ref 98–111)
Creatinine, Ser: 0.64 mg/dL (ref 0.44–1.00)
GFR, Estimated: >60 mL/min (ref >60)
Glucose, Bld: 120 mg/dL — ABNORMAL HIGH (ref 70–99)
Potassium: 3.7 mmol/L (ref 3.5–5.1)
Sodium: 134 mmol/L — ABNORMAL LOW (ref 135–145)
Total Bilirubin: 0.4 mg/dL (ref 0.3–1.2)
Total Protein: 5.6 g/dL — ABNORMAL LOW (ref 6.5–8.1)

## 2022-07-05 LAB — SEDIMENTATION RATE: Sed Rate: 93 mm/hr — ABNORMAL HIGH (ref 0–22)

## 2022-07-05 LAB — CUP PACEART REMOTE DEVICE CHECK
Battery Remaining Longevity: 76 mo
Battery Remaining Percentage: 61 %
Battery Voltage: 3.01 V
Brady Statistic AP VP Percent: 9.6 %
Brady Statistic AP VS Percent: 2.6 %
Brady Statistic AS VP Percent: 12 %
Brady Statistic AS VS Percent: 76 %
Brady Statistic RA Percent Paced: 11 %
Brady Statistic RV Percent Paced: 22 %
Date Time Interrogation Session: 20230929131255
Implantable Lead Implant Date: 20190614
Implantable Lead Implant Date: 20190614
Implantable Lead Location: 753859
Implantable Lead Location: 753860
Implantable Pulse Generator Implant Date: 20190614
Lead Channel Impedance Value: 450 Ohm
Lead Channel Impedance Value: 480 Ohm
Lead Channel Pacing Threshold Amplitude: 0.75 V
Lead Channel Pacing Threshold Amplitude: 1 V
Lead Channel Pacing Threshold Pulse Width: 0.5 ms
Lead Channel Pacing Threshold Pulse Width: 0.5 ms
Lead Channel Sensing Intrinsic Amplitude: 12 mV
Lead Channel Sensing Intrinsic Amplitude: 2.1 mV
Lead Channel Setting Pacing Amplitude: 1.25 V
Lead Channel Setting Pacing Amplitude: 2 V
Lead Channel Setting Pacing Pulse Width: 0.5 ms
Lead Channel Setting Sensing Sensitivity: 2 mV
Pulse Gen Model: 2272
Pulse Gen Serial Number: 9031934

## 2022-07-05 LAB — LEGIONELLA PNEUMOPHILA SEROGP 1 UR AG: L. pneumophila Serogp 1 Ur Ag: NEGATIVE

## 2022-07-05 LAB — OSMOLALITY, URINE: Osmolality, Ur: 423 mOsm/kg (ref 300–900)

## 2022-07-05 LAB — BRAIN NATRIURETIC PEPTIDE: B Natriuretic Peptide: 311.3 pg/mL — ABNORMAL HIGH (ref 0.0–100.0)

## 2022-07-05 LAB — OSMOLALITY: Osmolality: 279 mOsm/kg (ref 275–295)

## 2022-07-05 LAB — MAGNESIUM: Magnesium: 1.9 mg/dL (ref 1.7–2.4)

## 2022-07-05 SURGERY — BIOPSY TEMPORAL ARTERY
Anesthesia: General | Laterality: Left

## 2022-07-05 MED ORDER — PHENYLEPHRINE 80 MCG/ML (10ML) SYRINGE FOR IV PUSH (FOR BLOOD PRESSURE SUPPORT)
PREFILLED_SYRINGE | INTRAVENOUS | Status: AC
  Filled 2022-07-05: qty 10

## 2022-07-05 MED ORDER — DEXAMETHASONE SODIUM PHOSPHATE 10 MG/ML IJ SOLN
INTRAMUSCULAR | Status: AC
  Filled 2022-07-05: qty 1

## 2022-07-05 MED ORDER — CEFAZOLIN SODIUM 1 G IJ SOLR
INTRAMUSCULAR | Status: AC
  Filled 2022-07-05: qty 20

## 2022-07-05 MED ORDER — CEFAZOLIN SODIUM-DEXTROSE 2-3 GM-%(50ML) IV SOLR
INTRAVENOUS | Status: DC | PRN
  Administered 2022-07-05: 2 g via INTRAVENOUS

## 2022-07-05 MED ORDER — ONDANSETRON HCL 4 MG/2ML IJ SOLN
INTRAMUSCULAR | Status: AC
  Filled 2022-07-05: qty 2

## 2022-07-05 MED ORDER — CHLORHEXIDINE GLUCONATE 0.12 % MT SOLN: 15.0000 mL | Freq: Once | OROMUCOSAL | Status: DC

## 2022-07-05 MED ORDER — FENTANYL CITRATE (PF) 250 MCG/5ML IJ SOLN
INTRAMUSCULAR | Status: AC
  Filled 2022-07-05: qty 5

## 2022-07-05 MED ORDER — DEXAMETHASONE SODIUM PHOSPHATE 10 MG/ML IJ SOLN
INTRAMUSCULAR | Status: DC | PRN
  Administered 2022-07-05: 10 mg via INTRAVENOUS

## 2022-07-05 MED ORDER — ONDANSETRON HCL 4 MG/2ML IJ SOLN
INTRAMUSCULAR | Status: DC | PRN
  Administered 2022-07-05: 4 mg via INTRAVENOUS

## 2022-07-05 MED ORDER — CHLORHEXIDINE GLUCONATE 0.12 % MT SOLN
OROMUCOSAL | Status: AC
  Filled 2022-07-05: qty 15

## 2022-07-05 MED ORDER — NALOXEGOL OXALATE 25 MG PO TABS
25.0000 mg | ORAL_TABLET | Freq: Two times a day (BID) | ORAL | Status: DC | PRN
  Administered 2022-07-05: 25 mg via ORAL
  Filled 2022-07-05: qty 1

## 2022-07-05 MED ORDER — PROPOFOL 10 MG/ML IV BOLUS
INTRAVENOUS | Status: AC
  Filled 2022-07-05: qty 20

## 2022-07-05 MED ORDER — POTASSIUM CHLORIDE 20 MEQ PO PACK
40.0000 meq | PACK | Freq: Once | ORAL | Status: AC
  Administered 2022-07-05: 40 meq via ORAL
  Filled 2022-07-05 (×2): qty 2

## 2022-07-05 MED ORDER — METHYLPREDNISOLONE SODIUM SUCC 125 MG IJ SOLR
100.0000 mg | Freq: Once | INTRAMUSCULAR | Status: AC
  Administered 2022-07-05: 100 mg via INTRAVENOUS
  Filled 2022-07-05: qty 1.6

## 2022-07-05 MED ORDER — LIDOCAINE HCL (PF) 1 % IJ SOLN
INTRAMUSCULAR | Status: DC | PRN
  Administered 2022-07-05: 2 mL via SUBCUTANEOUS

## 2022-07-05 MED ORDER — LACTATED RINGERS IV SOLN: INTRAVENOUS | Status: DC | PRN

## 2022-07-05 MED ORDER — FUROSEMIDE 20 MG PO TABS
20.0000 mg | ORAL_TABLET | Freq: Once | ORAL | Status: AC
  Administered 2022-07-05: 20 mg via ORAL
  Filled 2022-07-05: qty 1

## 2022-07-05 MED ORDER — METHYLPREDNISOLONE SODIUM SUCC 125 MG IJ SOLR: 125.0000 mg | INTRAMUSCULAR | Status: DC

## 2022-07-05 MED ORDER — PROPOFOL 10 MG/ML IV BOLUS
INTRAVENOUS | Status: DC | PRN
  Administered 2022-07-05: 70 mg via INTRAVENOUS

## 2022-07-05 MED ORDER — PHENYLEPHRINE HCL (PRESSORS) 10 MG/ML IV SOLN
INTRAVENOUS | Status: DC | PRN
  Administered 2022-07-05: 80 ug via INTRAVENOUS

## 2022-07-05 MED ORDER — LACTATED RINGERS IV SOLN: INTRAVENOUS | Status: DC

## 2022-07-05 MED ORDER — LIDOCAINE HCL (PF) 1 % IJ SOLN
INTRAMUSCULAR | Status: AC
  Filled 2022-07-05: qty 30

## 2022-07-05 MED ORDER — LIDOCAINE HCL (CARDIAC) PF 100 MG/5ML IV SOSY
PREFILLED_SYRINGE | INTRAVENOUS | Status: DC | PRN
  Administered 2022-07-05: 40 mg via INTRAVENOUS

## 2022-07-05 MED ORDER — LIDOCAINE 2% (20 MG/ML) 5 ML SYRINGE
INTRAMUSCULAR | Status: AC
  Filled 2022-07-05: qty 5

## 2022-07-05 MED ORDER — ORAL CARE MOUTH RINSE: 15.0000 mL | Freq: Once | OROMUCOSAL | Status: DC

## 2022-07-05 SURGICAL SUPPLY — 42 items
ADH SKN CLS APL DERMABOND .7 (GAUZE/BANDAGES/DRESSINGS) ×1
ADH SKN CLS LQ APL DERMABOND (GAUZE/BANDAGES/DRESSINGS) ×1
BAG COUNTER SPONGE SURGICOUNT (BAG) ×2 IMPLANT
BAG SPNG CNTER NS LX DISP (BAG) ×1
BALL CTTN LRG ABS STRL LF (GAUZE/BANDAGES/DRESSINGS) ×1
CANISTER SUCT 3000ML PPV (MISCELLANEOUS) ×1 IMPLANT
CLIP TI WIDE RED SMALL 6 (CLIP) IMPLANT
CNTNR URN SCR LID CUP LEK RST (MISCELLANEOUS) ×1 IMPLANT
CONT SPEC 4OZ STRL OR WHT (MISCELLANEOUS) ×1
COTTONBALL LRG STERILE PKG (GAUZE/BANDAGES/DRESSINGS) ×1 IMPLANT
COVER SURGICAL LIGHT HANDLE (MISCELLANEOUS) ×1 IMPLANT
DERMABOND ADVANCED .7 DNX12 (GAUZE/BANDAGES/DRESSINGS) ×1 IMPLANT
DERMABOND ADVANCED .7 DNX6 (GAUZE/BANDAGES/DRESSINGS) IMPLANT
DRAPE OPHTHALMIC 77X100 STRL (CUSTOM PROCEDURE TRAY) ×2 IMPLANT
ELECT NDL TIP 2.8 STRL (NEEDLE) ×2 IMPLANT
ELECT NEEDLE TIP 2.8 STRL (NEEDLE) ×1 IMPLANT
ELECT REM PT RETURN 9FT ADLT (ELECTROSURGICAL) ×1
ELECTRODE REM PT RTRN 9FT ADLT (ELECTROSURGICAL) ×2 IMPLANT
GAUZE SPONGE 2X2 8PLY STRL LF (GAUZE/BANDAGES/DRESSINGS) ×2 IMPLANT
GLOVE BIO SURGEON STRL SZ7.5 (GLOVE) ×1 IMPLANT
GLOVE BIOGEL PI IND STRL 8 (GLOVE) ×1 IMPLANT
GLOVE SURG POLY ORTHO LF SZ7.5 (GLOVE) IMPLANT
GLOVE SURG UNDER LTX SZ8 (GLOVE) ×2 IMPLANT
GOWN STRL REUS W/ TWL LRG LVL3 (GOWN DISPOSABLE) ×6 IMPLANT
GOWN STRL REUS W/TWL LRG LVL3 (GOWN DISPOSABLE) ×3
KIT BASIN OR (CUSTOM PROCEDURE TRAY) ×1 IMPLANT
KIT TURNOVER KIT B (KITS) ×2 IMPLANT
NDL HYPO 25GX1X1/2 BEV (NEEDLE) ×1 IMPLANT
NEEDLE HYPO 25GX1X1/2 BEV (NEEDLE) ×1 IMPLANT
NS IRRIG 1000ML POUR BTL (IV SOLUTION) ×2 IMPLANT
PACK GENERAL/GYN (CUSTOM PROCEDURE TRAY) ×1 IMPLANT
PAD ARMBOARD 7.5X6 YLW CONV (MISCELLANEOUS) ×2 IMPLANT
SPIKE FLUID TRANSFER (MISCELLANEOUS) ×2 IMPLANT
SUT MNCRL AB 4-0 PS2 18 (SUTURE) ×2 IMPLANT
SUT PROLENE 6 0 BV (SUTURE) IMPLANT
SUT SILK 3 0 (SUTURE) ×1
SUT SILK 3-0 18XBRD TIE 12 (SUTURE) ×1 IMPLANT
SUT VIC AB 3-0 SH 27 (SUTURE) ×1
SUT VIC AB 3-0 SH 27X BRD (SUTURE) ×1 IMPLANT
SYR CONTROL 10ML LL (SYRINGE) ×2 IMPLANT
TOWEL GREEN STERILE (TOWEL DISPOSABLE) ×2 IMPLANT
WATER STERILE IRR 1000ML POUR (IV SOLUTION) ×1 IMPLANT

## 2022-07-05 NOTE — Progress Notes (Signed)
  Transition of Care [facility]) Screening Note   Patient Details  Name: Meghan Oliver Date of Birth: 04-21-1941   Transition of Care [facility]) CM/SW Contact:    Cyndi Bender, RN Phone Number: 07/05/2022, 9:41 AM    Transition of Care Department [facility]) has reviewed patient and no TOC needs have been identified at this time. We will continue to monitor patient advancement through interdisciplinary progression rounds. If new patient transition needs arise, please place a TOC consult.

## 2022-07-05 NOTE — Progress Notes (Addendum)
PROGRESS NOTE                                                                                                                                                                                                             Patient Demographics:    Meghan Oliver, is a 81 y.o. female, DOB - 16-Dec-1940, YFR:[id number]  Outpatient Primary MD for the patient is Baxley, Cresenciano Lick, MD    LOS - 3  Admit date - 07/01/2022    Chief Complaint  Patient presents with   Headache       Brief Narrative (HPI from H&P)   81 y.o. female with medical history significant of HTN, HLD, hypothyroidism, OSA, GERD, sinus node dysfunction s/p PPM in 03/2018, bipolar and depression who presented to ED with 3 day history of cough, chills and a severe headache. New intermittent episodes of headache on left temple x 3 days prior to hospitalization of note she had similar headaches about 2 months ago which lasted for a few days.  No photophobia, no jaw claudication, no problems chewing food.  No temporal tenderness.  In the hospital she was diagnosed with pneumonia and admitted to the hospital.  Neurology was consulted for headaches.   Subjective:   Patient in bed, appears comfortable, denies any headache, no fever, no chest pain or pressure, no shortness of breath , no abdominal pain. No new focal weakness. No vision changes   Assessment  & Plan :    Community acquired pneumonia - she has been started on appropriate antibiotics, has intermittent delirium and a very weak cough reflex increasing the chances of aspiration, will involve speech therapy, continue supportive care with oxygen, I-S and flutter valve for pulmonary toiletry.  Supportive care for cough.  Follow cultures closely.  Pneumonia could explain her elevated ESR and CRP.  However if her inflammatory markers remain significantly high despite appropriate treatment.  Since ESR remains +++ high, have added  Rheum workup as well, ANA, ANCA, RF, CCP, DS DNA.   Intermittent episodes of left temporal headache - 3 day history of intermittent left sided temporal headache with some radiation to right side. Non tender on exam, no pain with chewing, no phonophobia/photophobia.  This happened 2 months ago and lasted for a few days, ESR is still significantly high, CT scan head, CT angiogram head and neck  and CT venogram nonacute.  Temporal artery ultrasound and MRI brain stable.  Case discussed with neurologist Dr. Leonel Ramsay, Dr. Alferd Patee, and Dr. Malen Gauze on 07/05/2022, post temporal artery biopsy may give a therapeutic trial of steroids.Vascular surgery consulted for temporal artery biopsy -will be done on 07/05/22.  Elevated troponin - She has no chest pain or ekg changes, troponin trend flat and not an ACS pattern, continue aspirin and statin for secondary prevention Case discussed with her cardiologist Dr. Loralie Champagne, no further work-up for now.  Macrocytic anemia - Hb nadir of 9.7 after hip surgery in August, iron studies inconclusive, B12 stable, continue to monitor no signs of infection.  Some drop due to hemodilution.  Continue PPI twice daily.  Some fall due to hemodilution from IV fluids.  Continue to monitor.  Bipolar/depression - History of ECT x 30 in past, Continue home medication of paxil, pamelor, remeron and ativan PRN and lithium, stable lithium level.  Sinus node dysfunction (HCC) - S/p St. Jude PPM in 03/2018.   Memory loss - Continue Namenda, at risk for delirium and aspiration.  Discussed with her primary nurse and husband on 07/02/2022.  Hyperlipidemia - Continue Crestor.   Hypothyroidism - TSH wnl in 03/2022, Continue home synthroid.   Hyponatremia - likely SIADH, hold IVF, monitor.  Chronic constipation - Continue home senokot, linzess, miralaz prn, also has movik prn, giving a dose on 07/04/2022.   GERD - Continue PPI.   OSA (obstructive sleep apnea) - Not on cpap, non compliant     Hypomagnesemia.  Replaced.  Asymptomatic transaminitis.  Have been elevated for a few months, could have been higher due to some hepatic congestion from IV fluids, is on Crestor, will also review her psych medications, will check INR to check synthetic function, total bilirubin stable, stable right upper quadrant ultrasound.       Condition - Extremely Guarded  Family Communication  :    Updated personal nurse at bedside in detail on 07/02/2022, 07/03/22.  Updated spouse Meghan Oliver [phone] on 07/02/2022, 07/03/22 message left at 9:43 AM, 07/04/22, 07/05/22  Code Status :  Full  Consults  :  Neuro  PUD Prophylaxis : PPI   Procedures  :     Left temporal artery biopsy done by vascular surgeon Dr. Quentin Cornwall on 07/05/2022.    MRI - 1. No acute intracranial abnormality. 2. Mild chronic small vessel ischemic disease  [name] Temp Artery - Stable  CT scan head, CT angiogram head and neck and CT venogram nonacute  RUQ [name] - 1. No acute findings. 2. Dense material layers in the gallbladder consistent with dense sludge or small stones. 3. Normal appearance of the liver.  No bile duct dilation.       Disposition Plan  :    Status is: Observation  DVT Prophylaxis  :    enoxaparin (LOVENOX) injection 30 mg Start: 07/01/22 1915   Lab Results  Component Value Date   PLT 209 07/05/2022    Diet :  Diet Order             DIET SOFT Room service appropriate? Yes; Fluid consistency: Thin  Diet effective now                    Inpatient Medications  Scheduled Meds:  [MAR Hold] aspirin  81 mg Oral Daily   [MAR Hold] benzonatate  100 mg Oral TID   chlorhexidine  15 mL Mouth/Throat Once   Or   mouth rinse  15 mL Mouth Rinse Once   chlorhexidine       [MAR Hold] cyanocobalamin  1,000 mcg Oral QHS   [MAR Hold] enoxaparin (LOVENOX) injection  30 mg Subcutaneous Q24H   [MAR Hold] fesoterodine  8 mg Oral Daily   [MAR Hold] fluticasone  2 spray Each Nare Daily   [MAR Hold]  gabapentin  300 mg Oral BID   [MAR Hold] levothyroxine  75 mcg Oral Q0600   [MAR Hold] linaclotide  72 mcg Oral BID   [MAR Hold] lithium carbonate  300 mg Oral QHS   [MAR Hold] loratadine  10 mg Oral Daily   [MAR Hold] memantine  10 mg Oral BID   [MAR Hold] mirtazapine  7.5 mg Oral QHS   [MAR Hold] nortriptyline  75 mg Oral QHS   [MAR Hold] pantoprazole  40 mg Oral BID AC   [MAR Hold] PARoxetine  20 mg Oral QHS   [MAR Hold] primidone  100 mg Oral q AM   [MAR Hold] primidone  50 mg Oral QHS   [MAR Hold] vitamin B-6  500 mg Oral BID   [MAR Hold] rosuvastatin  20 mg Oral Daily   [MAR Hold] senna-docusate  2 tablet Oral BID   Continuous Infusions:  [MAR Hold] azithromycin (ZITHROMAX) 500 mg in sodium chloride 0.9 % 250 mL IVPB 500 mg (07/04/22 1037)   [MAR Hold] cefTRIAXone (ROCEPHIN)  IV 1 g (07/04/22 0907)   lactated ringers     PRN Meds:.[MAR Hold] acetaminophen **OR** [MAR Hold] acetaminophen, [MAR Hold] albuterol, chlorhexidine, [MAR Hold] guaiFENesin-codeine, [MAR Hold] guaiFENesin-dextromethorphan, [MAR Hold] LORazepam, [MAR Hold] LORazepam, [MAR Hold] naloxegol oxalate, [MAR Hold] ondansetron (ZOFRAN) IV, [MAR Hold] polyethylene glycol, [MAR Hold] traMADol  Time Spent in minutes  30   Lala Lund M.D on 07/05/2022 at 9:59 AM  To page go to [email or web address]   [facility] -  Office  [phone]  See all Orders from today for further details    Objective:   Vitals:   07/05/22 0200 07/05/22 0600 07/05/22 0743 07/05/22 0828  BP: 131/60 (!) 126/59 (!) 139/58 133/60  Pulse: 75  74 71  Resp: (!) 21 19 (!) 21 17  Temp: 98.6 F (37 C)  99.3 F (37.4 C) 98.9 F (37.2 C)  TempSrc: Axillary  Oral Oral  SpO2: 95%  97% 95%  Weight:    40.8 kg  Height:    4' 10"  (1.473 m)    Wt Readings from Last 3 Encounters:  07/05/22 40.8 kg  05/18/22 42.6 kg  03/31/22 44.7 kg     Intake/Output Summary (Last 24 hours) at 07/05/2022 0959 Last data filed at 07/05/2022 0931 Gross  per 24 hour  Intake 20 ml  Output 405 ml  Net -385 ml     Physical Exam  Awake Alert, No new F.N deficits, Normal affect [place].AT,PERRAL Supple Neck, No JVD,   Symmetrical Chest wall movement, Good air movement bilaterally, few rales RRR,No Gallops, Rubs or new Murmurs,  +ve B.Sounds, Abd Soft, No tenderness,   No Cyanosis, Clubbing or edema    Data Review:    CBC Recent Labs  Lab 07/01/22 1140 07/02/22 0737 07/03/22 0652 07/04/22 0625 07/05/22 0100  WBC 10.8* 8.9 7.5 6.6 6.6  HGB 10.1* 9.1* 8.9* 8.8* 8.2*  HCT 32.3* 29.0* 26.9* 26.7* 25.0*  PLT 179 174 183 172 209  MCV 103.2* 103.6* 100.0 100.8* 98.8  MCH 32.3 32.5 33.1 33.2 32.4  MCHC 31.3 31.4 33.1 33.0 32.8  RDW 13.4 13.4  13.7 14.2 14.2  LYMPHSABS 0.8  --  1.0 0.9 1.0  MONOABS 0.6  --  1.0 1.0 0.9  EOSABS 0.1  --  0.1 0.2 0.2  BASOSABS 0.0  --  0.0 0.0 0.0    Electrolytes Recent Labs  Lab 07/01/22 1140 07/01/22 1618 07/01/22 1818 07/01/22 2052 07/02/22 0737 07/03/22 0652 07/04/22 0625 07/04/22 1045 07/05/22 0100  NA 136  --   --   --  135 135 133*  --  134*  K 4.2  --   --   --  3.9 4.0 3.9  --  3.7  CL 105  --   --   --  107 109 111  --  110  CO2 22  --   --   --  24 19* 18*  --  18*  GLUCOSE 86  --   --   --  102* 101* 88  --  120*  BUN 24*  --   --   --  20 11 14   --  19  CREATININE 0.74  --   --   --  0.74 0.70 0.70  --  0.64  CALCIUM 9.0  --   --   --  8.0* 8.1* 8.0*  --  8.5*  AST  --   --   --   --   --  133* 200*  --  166*  ALT  --   --   --   --   --  170* 214*  --  199*  ALKPHOS  --   --   --   --   --  62 72  --  74  BILITOT  --   --   --   --   --  0.4 0.4  --  0.4  ALBUMIN  --   --   --   --   --  2.4* 2.2*  --  2.2*  MG  --   --   --   --  1.6* 2.5* 2.1  --  1.9  CRP  --  18.3* 18.1*  --   --  16.7*  --   --   --   PROCALCITON  --   --   --  2.05 1.51 0.94 0.59  --   --   INR  --   --   --   --   --   --   --  1.2  --   BNP  --   --   --  206.2*  --  510.5* 443.2*  --  311.3*     ------------------------------------------------------------------------------------------------------------------ No results for input(s): "CHOL", "HDL", "LDLCALC", "TRIG", "CHOLHDL", "LDLDIRECT" in the last 72 hours.  Lab Results  Component Value Date   HGBA1C 5.1 02/15/2011    No results for input(s): "TSH", "T4TOTAL", "T3FREE", "THYROIDAB" in the last 72 hours.  Invalid input(s): "FREET3" ------------------------------------------------------------------------------------------------------------------ ID Labs Recent Labs  Lab 07/01/22 1140 07/01/22 1618 07/01/22 1818 07/01/22 2052 07/02/22 0737 07/03/22 0652 07/04/22 0625 07/05/22 0100  WBC 10.8*  --   --   --  8.9 7.5 6.6 6.6  PLT 179  --   --   --  174 183 172 209  CRP  --  18.3* 18.1*  --   --  16.7*  --   --   PROCALCITON  --   --   --  2.05 1.51 0.94 0.59  --   CREATININE 0.74  --   --   --  0.74 0.70 0.70 0.64   Radiology Reports DG Chest Port 1 View  Result Date: 07/04/2022 CLINICAL DATA:  Headache.  Follow-up study. EXAM: PORTABLE CHEST 1 VIEW COMPARISON:  07/02/2022 and older exams. FINDINGS: Interval worsening of lung aeration. Right upper lobe airspace opacity has mildly increased. There is increased opacity in the right perihilar to lower lung. Left lung remains clear. No convincing pleural effusion.  No pneumothorax. Normal size cardiac silhouette and stable left anterior chest wall pacemaker. IMPRESSION: 1. Worsened right lung aeration compared to the most recent prior study. Right lung airspace opacities have increased as detailed consistent with multifocal pneumonia. Electronically Signed   By: Lajean Manes M.D.   On: 07/04/2022 10:14   US Abdomen Limited RUQ (LIVER/GB)  Result Date: 07/04/2022 CLINICAL DATA:  Transaminitis. EXAM: ULTRASOUND ABDOMEN LIMITED RIGHT UPPER QUADRANT COMPARISON:  CT abdomen pelvis, 08/09/2017. FINDINGS: Gallbladder: Dense layering material consistent with small stones or dense  sludge. No wall thickening. No pericholecystic fluid. No sonographic Murphy's sign. Common bile duct: Diameter: 5 mm Liver: No focal lesion identified. Within normal limits in parenchymal echogenicity. Portal vein is patent on color Doppler imaging with normal direction of blood flow towards the liver. Other: Right pleural effusion. IMPRESSION: 1. No acute findings. 2. Dense material layers in the gallbladder consistent with dense sludge or small stones. 3. Normal appearance of the liver.  No bile duct dilation. Electronically Signed   By: Lajean Manes M.D.   On: 07/04/2022 10:12   DG Swallowing Func-Speech Pathology  Result Date: 07/02/2022 Table formatting from the original result was not included. Objective Swallowing Evaluation: Type of Study: MBS-Modified Barium Swallow Study  Patient Details Name: ANNALEI FRIESZ MRN: [id number] Date of Birth: 1941/03/19 Today's Date: 07/02/2022 Time: SLP Start Time (ACUTE ONLY): 0240 -SLP Stop Time (ACUTE ONLY): 9735 SLP Time Calculation (min) (ACUTE ONLY): 17 min Past Medical History: Past Medical History: Diagnosis Date  Anxiety   Arthritis   "hips, spine" (03/17/2018)  Bipolar II disorder (HCC)   CHF (congestive heart failure) (HCC)   Chronic bronchitis (HCC)   Chronic lower back pain   Chronic right hip pain   CKD (chronic kidney disease), stage II   Coronary artery disease   stent x1  Esophagitis, erosive   GAD (generalized anxiety disorder)   GERD (gastroesophageal reflux disease)   Headache   "maybe monthly" (03/17/2018))  Heart murmur, systolic   History of adenomatous polyp of colon   08-04-2016  tubular adenoma  History of blood transfusion 12/2017  "related to vascular hematoma"  History of electroconvulsive therapy   at [place]--  started 04-15-2015 to 11-17-2016  total greater than 40 times  History of hiatal hernia   Hyperlipidemia   Hypertension   Hypothyroidism   Internal carotid artery stenosis, bilateral   per last duplex 05-01-2014  bilateral ICA 40-59%  Major  depression, chronic   ECT treatments extensive and multiple started 07/ 2016  Memory loss   "both short and long-term; needs frequent reminders to follow instrucitons" (05/16/2017)  Migraines   "none in years" (03/17/2018)  OSA (obstructive sleep apnea)   per study 06/ 2012 moderate OSA  ; "refuses to wear masks" (03/17/2018)  Osteoporosis   Poor historian   due to short term memory loss  Presence of permanent cardiac pacemaker 03/17/2018  Pulmonary nodule   monitored by pcp  S/P placement of cardiac pacemaker 03/17/18 [facility]  03/18/2018  Short-term memory loss   Sick sinus syndrome ([place])  Past Surgical History:  Past Surgical History: Procedure Laterality Date  APPENDECTOMY  1971  AUGMENTATION MAMMAPLASTY Bilateral   CARDIOVASCULAR STRESS TEST  01-30-2015  dr Aundra Dubin  Low risk nuclear study w/ no evidence ischemia or infarction/  normal LV funciton and wall motion , 76%  COLONOSCOPY W/ BIOPSIES AND POLYPECTOMY  "multiple"  COLONOSCOPY WITH ESOPHAGOGASTRODUODENOSCOPY (EGD)  last one 08-04-2016  CORONARY ANGIOPLASTY WITH STENT PLACEMENT  05/16/2017  "LAD"  CORONARY STENT INTERVENTION N/A 05/16/2017  Procedure: CORONARY STENT INTERVENTION;  Surgeon: Burnell Blanks, MD;  Location: [facility];  Service: Cardiovascular;  Laterality: N/A;  ESOPHAGOGASTRODUODENOSCOPY  02-26-04  ESOPHAGOGASTRODUODENOSCOPY  "multiple"  INSERT / REPLACE / REMOVE PACEMAKER  03/17/2018  LEFT HEART CATH AND CORONARY ANGIOGRAPHY N/A 05/16/2017  Procedure: LEFT HEART CATH AND CORONARY ANGIOGRAPHY;  Surgeon: Larey Dresser, MD;  Location: [facility];  Service: Cardiovascular;  Laterality: N/A;  OVARIAN CYST SURGERY  1970s  Laparotomy   PACEMAKER IMPLANT N/A 03/17/2018  Procedure: PACEMAKER IMPLANT;  Surgeon: Evans Lance, MD;  Location: [facility];  Service: Cardiovascular;  Laterality: N/A;  PORT-A-CATH PLACEMENT  05/31/2016; 2018  "@ [facility]; for ECT series"; "@ [facility] also"  PORT-A-CATH REMOVAL N/A 03/11/2017  Procedure:  REMOVAL PORT-A-CATH;  Surgeon: Jackolyn Confer, MD;  Location: [facility];  Service: General;  Laterality: N/A;  PORTA CATH REMOVAL  03/17/2018  PORTA CATH REMOVAL  03/17/2018  Procedure: PORTA CATH REMOVAL;  Surgeon: Evans Lance, MD;  Location: [facility];  Service: Cardiovascular;;  TOTAL HIP ARTHROPLASTY Right 05/18/2022  Procedure: TOTAL HIP ARTHROPLASTY ANTERIOR APPROACH;  Surgeon: Paralee Cancel, MD;  Location: [facility];  Service: Orthopedics;  Laterality: Right;  TRANSTHORACIC ECHOCARDIOGRAM  09/05/2013  dr Aundra Dubin  mild LVH, ef 17-00%, grade 1 diastolic dysfunction/  very mild AV stenosis with mild AR/  trivial MR and PT/ mild to moderate LAE/ mild TR/ mild pulmonary hypertension with PA peak pressure 49mHg  TUBAL LIGATION   HPI: 81y.o. female who presented to ED with 3 day history of cough, chills and a severe headache. New intermittent episodes of headache on left temple x 3 days prior to hospitalization of note she had similar headaches about 2 months ago which lasted for a few days.  In the hospital she was diagnosed with pneumonia and admitted to the hospital.  Neurology was consulted for headaches. Pt with medical history significant of HTN, HLD, hypothyroidism, OSA, GERD, sinus node dysfunction s/p PPM in 03/2018, bipolar and depression. Caregiver reports hx esophageal dilation x2.  Subjective: awake, cooperative but also confused, asking some repetitive questions  Recommendations for follow up therapy are one component of a multi-disciplinary discharge planning process, led by the attending physician.  Recommendations may be updated based on patient status, additional functional criteria and insurance authorization. Assessment / Plan / Recommendation   07/02/2022   2:00 PM Clinical Impressions Clinical Impression Pt has generally functional oropharyngeal swallowing, but she does appear to have a prominent CP that might partially interfere with bolus flow but leaving only trace  residue within the UES or just above. No aspiration occurs and coughing throughout MBS is not related to airway protection. Of note, the barium tablet stayed briefly in the distal esophagus but did appear to clear with an additional liquid wash (MD not present to confirm). Recommend continuing regular solids and thin liquids as tolerated. SLP Visit Diagnosis Dysphagia, unspecified (R13.10) Impact on safety and function Mild aspiration risk     07/02/2022  2:00 PM Treatment Recommendations Treatment Recommendations No treatment recommended at this time     07/02/2022   2:00 PM Prognosis Prognosis for Safe Diet Advancement Good   07/02/2022   2:00 PM Diet Recommendations SLP Diet Recommendations Regular solids;Thin liquid Liquid Administration via Cup;Straw Medication Administration Whole meds with liquid Compensations Slow rate;Small sips/bites;Minimize environmental distractions Postural Changes Seated upright at 90 degrees;Remain semi-upright after after feeds/meals (Comment)     07/02/2022   2:00 PM Other Recommendations Oral Care Recommendations Oral care BID Follow Up Recommendations No SLP follow up Assistance recommended at discharge PRN Functional Status Assessment Patient has not had a recent decline in their functional status   07/02/2022  11:50 AM Frequency and Duration  Speech Therapy Frequency (ACUTE ONLY) --     07/02/2022   2:00 PM Oral Phase Oral Phase [facility]    07/02/2022   2:00 PM Pharyngeal Phase Pharyngeal Phase [facility]    07/02/2022   2:00 PM Cervical Esophageal Phase  Cervical Esophageal Phase Impaired Thin Cup Reduced cricopharyngeal relaxation;Prominent cricopharyngeal segment Thin Straw Reduced cricopharyngeal relaxation;Prominent cricopharyngeal segment Puree Reduced cricopharyngeal relaxation;Prominent cricopharyngeal segment Regular Reduced cricopharyngeal relaxation;Prominent cricopharyngeal segment Pill Reduced cricopharyngeal relaxation;Prominent cricopharyngeal segment Osie Bond., M.A. [place] Office [phone] Secure chat preferred 07/02/2022, 3:12 PM                     VAS [name] TEMPORAL ARTERY BILATERAL  Result Date: 07/02/2022  TEMPORAL ARTERY REPORT Patient Name:  Meghan Oliver  Date of Exam:   07/02/2022 Medical Rec #: [id number]       Accession #:    [id number] Date of Birth: 04-08-1941        Patient Gender: F Patient Age:   46 years Exam Location:  [facility] Procedure:      VAS [name] BILATERAL Referring Phys: MCNEILL KIRKPATRICK --------------------------------------------------------------------------------  Indications: Left sided temporal headache x3 days. High Risk Factors: Age > 82 yrs and female.  Performing Technologist: Archie Patten RVS  Examination Guidelines: Patient in reclined position. 2D, color and spectral doppler sampling in the temporal artery along the hairline and temple in the longitudinal plane. 2D images along the hairline and temple in the transverse plane. Exam is bilateral.  Summary: Absence of a "halo" sign in the bilateral temporal artery, although not definitive, makes a diagnosis of temporal arteritis unlikely.  *See table(s) above for measurements and observations.  Electronically signed by Antony Contras MD on 07/02/2022 at 2:26:10 PM.   Final    MR Brain W and Wo Contrast  Result Date: 07/02/2022 CLINICAL DATA:  Headache. EXAM: MRI HEAD WITHOUT AND WITH CONTRAST TECHNIQUE: Multiplanar, multiecho pulse sequences of the brain and surrounding structures were obtained without and with intravenous contrast. CONTRAST:  6 mL Vueway COMPARISON:  Head CT, CTA, and CTP 07/01/2022.  Head MRI 11/12/2015. FINDINGS: Postcontrast sequences are mildly motion degraded. Brain: There is no evidence of an acute infarct, intracranial hemorrhage, mass, midline shift, or extra-axial fluid collection. The ventricles and sulci are within normal limits for age. T2 hyperintensities in the cerebral white matter and pons have progressed from  2017 and are nonspecific but compatible with mild chronic small vessel ischemic disease. No abnormal enhancement is identified. Vascular: Major intracranial vascular flow voids are preserved. Skull and upper cervical spine: Unremarkable bone marrow signal. Sinuses/Orbits: Bilateral cataract extraction. Mucosal thickening and small volume fluid in the right maxillary sinus. Trace left mastoid fluid. Other: None. IMPRESSION: 1. No acute  intracranial abnormality. 2. Mild chronic small vessel ischemic disease. Electronically Signed   By: Logan Bores M.D.   On: 07/02/2022 13:37   DG Chest Port 1 View  Result Date: 07/02/2022 CLINICAL DATA:  Headache, cough, pneumonia. EXAM: PORTABLE CHEST 1 VIEW COMPARISON:  07/01/2022 FINDINGS: There is a left chest wall pacer device with lead in the right atrial appendage and right ventricle. Normal heart size. Aortic atherosclerotic calcifications. Persistent airspace disease within the right upper lobe is not significantly changed from previous exam. IMPRESSION: Persistent right upper lobe airspace disease compatible with pneumonia. Electronically Signed   By: Kerby Moors M.D.   On: 07/02/2022 06:28   CT VENOGRAM HEAD  Result Date: 07/01/2022 CLINICAL DATA:  Headache, classic migraine headache; Headache, tension-type EXAM: CT ANGIOGRAPHY HEAD CT VENOGRAM HEAD TECHNIQUE: Multidetector CT imaging of the head was performed using the standard protocol during bolus administration of intravenous contrast. Venographic phase images of the brain were obtained following the administration of intravenous contrast. Multiplanar reformats and maximum intensity projections were generated. RADIATION DOSE REDUCTION: This exam was performed according to the departmental dose-optimization program which includes automated exposure control, adjustment of the mA and/or kV according to patient size and/or use of iterative reconstruction technique. CONTRAST:  52m OMNIPAQUE IOHEXOL 350 MG/ML  SOLN COMPARISON:  Same day CT head. FINDINGS: CTA: Anterior circulation: Bilateral intracranial ICAs, MCAs, and ACAs are patent without proximal hemodynamically significant stenosis. No aneurysm identified. Posterior circulation: Left intradural vertebral artery is non dominant and terminates as PICA, anatomic variant. Right intradural vertebral artery, basilar artery and bilateral posterior cerebral arteries are patent without proximal hemodynamically significant stenosis. No aneurysm identified CTV: No evidence of dural venous sinus thrombosis. The superior sagittal, transverse and sigmoid sinuses are patent. Narrowing of the left transverse sinus. The straight sinus and visualized deep cerebral veins are patent IMPRESSION: 1. No evidence of large vessel occlusion or proximal hemodynamically significant stenosis. No aneurysm identified. 2. No evidence of dural venous sinus thrombosis. Electronically Signed   By: FMargaretha SheffieldM.D.   On: 07/01/2022 15:04   CT Angio Head W or Wo Contrast  Result Date: 07/01/2022 CLINICAL DATA:  Headache, classic migraine headache; Headache, tension-type EXAM: CT ANGIOGRAPHY HEAD CT VENOGRAM HEAD TECHNIQUE: Multidetector CT imaging of the head was performed using the standard protocol during bolus administration of intravenous contrast. Venographic phase images of the brain were obtained following the administration of intravenous contrast. Multiplanar reformats and maximum intensity projections were generated. RADIATION DOSE REDUCTION: This exam was performed according to the departmental dose-optimization program which includes automated exposure control, adjustment of the mA and/or kV according to patient size and/or use of iterative reconstruction technique. CONTRAST:  771mOMNIPAQUE IOHEXOL 350 MG/ML SOLN COMPARISON:  Same day CT head. FINDINGS: CTA: Anterior circulation: Bilateral intracranial ICAs, MCAs, and ACAs are patent without proximal hemodynamically significant  stenosis. No aneurysm identified. Posterior circulation: Left intradural vertebral artery is non dominant and terminates as PICA, anatomic variant. Right intradural vertebral artery, basilar artery and bilateral posterior cerebral arteries are patent without proximal hemodynamically significant stenosis. No aneurysm identified CTV: No evidence of dural venous sinus thrombosis. The superior sagittal, transverse and sigmoid sinuses are patent. Narrowing of the left transverse sinus. The straight sinus and visualized deep cerebral veins are patent IMPRESSION: 1. No evidence of large vessel occlusion or proximal hemodynamically significant stenosis. No aneurysm identified. 2. No evidence of dural venous sinus thrombosis. Electronically Signed   By: FrMargaretha Sheffield.D.   On: 07/01/2022 15:04  CT Head Wo Contrast  Result Date: 07/01/2022 CLINICAL DATA:  Left-sided headache.  Cough. EXAM: CT HEAD WITHOUT CONTRAST TECHNIQUE: Contiguous axial images were obtained from the base of the skull through the vertex without intravenous contrast. RADIATION DOSE REDUCTION: This exam was performed according to the departmental dose-optimization program which includes automated exposure control, adjustment of the mA and/or kV according to patient size and/or use of iterative reconstruction technique. COMPARISON:  05/07/2016. FINDINGS: Brain: No evidence of acute infarction, hemorrhage, hydrocephalus, extra-axial collection or mass lesion/mass effect. Vascular: No hyperdense vessel or unexpected calcification. Skull: Normal. Negative for fracture or focal lesion. Sinuses/Orbits: Visualized globes and orbits are unremarkable. The visualized sinuses are clear. Other: None. IMPRESSION: Normal unenhanced CT scan of the brain. Electronically Signed   By: Lajean Manes M.D.   On: 07/01/2022 12:28   DG Chest 2 View  Result Date: 07/01/2022 CLINICAL DATA:  Headache, cough, malaise, hypertension EXAM: CHEST - 2 VIEW COMPARISON:   06/12/2018 FINDINGS: Normal heart size, mediastinal contours, and pulmonary vascularity. Atherosclerotic calcification aorta. LEFT subclavian sequential transvenous pacemaker leads project at RIGHT atrium and RIGHT ventricle. Patchy infiltrates identified greatest RIGHT upper lobe consistent with pneumonia. No pleural effusion or pneumothorax. BILATERAL breast prostheses. Bones demineralized. IMPRESSION: RIGHT upper lobe infiltrate consistent with pneumonia. Aortic Atherosclerosis (ICD10-I70.0). Electronically Signed   By: Lavonia Dana M.D.   On: 07/01/2022 10:09

## 2022-07-05 NOTE — Progress Notes (Signed)
Neurology Progress Note   S:// Seen and examined S/P Temporal artery biopsy  O:// Current vital signs: BP 108/64 (BP Location: Right Arm)   Pulse 68   Temp 97.7 F (36.5 C)   Resp 17   Ht '4\' 10"'$  (1.473 m)   Wt 40.8 kg   LMP  (LMP Unknown)   SpO2 92%   BMI 18.81 kg/m  Vital signs in last 24 hours: Temp:  [97.7 F (36.5 C)-99.3 F (37.4 C)] 97.7 F (36.5 C) (10/02 1000) Pulse Rate:  [68-78] 68 (10/02 1030) Resp:  [16-27] 17 (10/02 1030) BP: (108-139)/(52-64) 108/64 (10/02 1030) SpO2:  [92 %-99 %] 92 % (10/02 1030) Weight:  [40.8 kg] 40.8 kg (10/02 0828) General: Comfortably sitting in bed HEENT: Normocephalic atraumatic CVS: Regular rate rhythm Abdomen nondistended nontender Neurologic exam Awake alert oriented x1 Dysarthric Poor attention concentration Difficult to test for aphasia Cranial nerves: Pupils equal round react light, extraocular movements intact, visual fields full, face appears symmetric. Motor examination with normal tone and generalized weakness with antigravity strength in all fours Sensation intact    Medications  Current Facility-Administered Medications:    [MAR Hold] acetaminophen (TYLENOL) tablet 650 mg, 650 mg, Oral, Q6H PRN, 650 mg at 07/03/22 2134 **OR** [MAR Hold] acetaminophen (TYLENOL) suppository 650 mg, 650 mg, Rectal, Q6H PRN, Orma Flaming, MD   [MAR Hold] albuterol (PROVENTIL) (2.5 MG/3ML) 0.083% nebulizer solution 2.5 mg, 2.5 mg, Nebulization, Q6H PRN, Orma Flaming, MD   [facility] Hold] aspirin chewable tablet 81 mg, 81 mg, Oral, Daily, Orma Flaming, MD, 81 mg at 07/04/22 0906   [MAR Hold] azithromycin (ZITHROMAX) 500 mg in sodium chloride 0.9 % 250 mL IVPB, 500 mg, Intravenous, Daily, Orma Flaming, MD, Last Rate: 250 mL/hr at 07/04/22 1037, 500 mg at 07/04/22 1037   [MAR Hold] benzonatate (TESSALON) capsule 100 mg, 100 mg, Oral, TID, Thurnell Lose, MD, 100 mg at 07/04/22 2150   [MAR Hold] cefTRIAXone (ROCEPHIN) 1 g in sodium  chloride 0.9 % 100 mL IVPB, 1 g, Intravenous, Daily, Orma Flaming, MD, Last Rate: 200 mL/hr at 07/04/22 0907, 1 g at 07/04/22 0907   chlorhexidine (PERIDEX) 0.12 % solution 15 mL, 15 mL, Mouth/Throat, Once **OR** Oral care mouth rinse, 15 mL, Mouth Rinse, Once, Howze, Leanord Hawking, MD   chlorhexidine (PERIDEX) 0.12 % solution, , , ,    [MAR Hold] cyanocobalamin (VITAMIN B12) tablet 1,000 mcg, 1,000 mcg, Oral, QHS, Lala Lund K, MD, 1,000 mcg at 07/04/22 2150   [facility] Hold] enoxaparin (LOVENOX) injection 30 mg, 30 mg, Subcutaneous, Q24H, Reome, Earle J, RPH, 30 mg at 07/04/22 2024   [MAR Hold] fesoterodine (TOVIAZ) tablet 8 mg, 8 mg, Oral, Daily, Orma Flaming, MD, 8 mg at 07/04/22 0905   [MAR Hold] fluticasone (FLONASE) 50 MCG/ACT nasal spray 2 spray, 2 spray, Each Nare, Daily, Thurnell Lose, MD, 2 spray at 07/04/22 0909   furosemide (LASIX) tablet 20 mg, 20 mg, Oral, Once, Thurnell Lose, MD   [facility] Hold] gabapentin (NEURONTIN) capsule 300 mg, 300 mg, Oral, BID, Lala Lund K, MD, 300 mg at 07/04/22 2148   [MAR Hold] guaiFENesin-codeine 100-10 MG/5ML solution 10 mL, 10 mL, Oral, Q6H PRN, Thurnell Lose, MD, 10 mL at 07/04/22 1547   [MAR Hold] guaiFENesin-dextromethorphan (ROBITUSSIN DM) 100-10 MG/5ML syrup 10 mL, 10 mL, Oral, Q4H PRN, Thurnell Lose, MD, 10 mL at 07/04/22 1348   lactated ringers infusion, , Intravenous, Continuous, Howze, Leanord Hawking, MD   [MAR Hold] levothyroxine (SYNTHROID) tablet  75 mcg, 75 mcg, Oral, Q0600, Orma Flaming, MD, 75 mcg at 07/05/22 0630   [MAR Hold] linaclotide (LINZESS) capsule 72 mcg, 72 mcg, Oral, BID, Orma Flaming, MD, 72 mcg at 07/04/22 2148   [facility] Hold] lithium carbonate capsule 300 mg, 300 mg, Oral, QHS, Orma Flaming, MD, 300 mg at 07/04/22 2151   [MAR Hold] loratadine (CLARITIN) tablet 10 mg, 10 mg, Oral, Daily, Lala Lund K, MD, 10 mg at 07/04/22 0905   [MAR Hold] LORazepam (ATIVAN) injection 1 mg, 1 mg, Intravenous, Once PRN,  Thurnell Lose, MD   [facility] Hold] LORazepam (ATIVAN) tablet 0.5 mg, 0.5 mg, Oral, Q8H PRN, Reome, Earle J, RPH, 0.5 mg at 07/02/22 0244   [MAR Hold] memantine (NAMENDA) tablet 10 mg, 10 mg, Oral, BID, Orma Flaming, MD, 10 mg at 07/04/22 2149   methylPREDNISolone sodium succinate (SOLU-MEDROL) 125 mg/2 mL injection 100 mg, 100 mg, Intravenous, Once, Thurnell Lose, MD   [facility] Hold] mirtazapine (REMERON) tablet 7.5 mg, 7.5 mg, Oral, QHS, Orma Flaming, MD, 7.5 mg at 07/04/22 2147   [facility] Hold] naloxegol oxalate (MOVANTIK) tablet 25 mg, 25 mg, Oral, Daily PRN, Thurnell Lose, MD, 25 mg at 07/04/22 2223   [MAR Hold] nortriptyline (PAMELOR) capsule 75 mg, 75 mg, Oral, QHS, Orma Flaming, MD, 75 mg at 07/04/22 2146   [facility] Hold] ondansetron (ZOFRAN) injection 4 mg, 4 mg, Intravenous, Q8H PRN, Orma Flaming, MD   [facility] Hold] pantoprazole (PROTONIX) EC tablet 40 mg, 40 mg, Oral, BID Clance Boll, MD, 40 mg at 07/04/22 1547   [MAR Hold] PARoxetine (PAXIL) tablet 20 mg, 20 mg, Oral, QHS, Orma Flaming, MD, 20 mg at 07/04/22 2149   [MAR Hold] polyethylene glycol (MIRALAX / GLYCOLAX) packet 17 g, 17 g, Oral, Daily PRN, Orma Flaming, MD   potassium chloride (KLOR-CON) packet 40 mEq, 40 mEq, Oral, Once, Thurnell Lose, MD   [MAR Hold] primidone (MYSOLINE) tablet 100 mg, 100 mg, Oral, q AM, Orma Flaming, MD, 100 mg at 07/04/22 0907   [MAR Hold] primidone (MYSOLINE) tablet 50 mg, 50 mg, Oral, QHS, Orma Flaming, MD, 50 mg at 07/04/22 2151   [MAR Hold] pyridOXINE (VITAMIN B6) tablet 500 mg, 500 mg, Oral, BID, Lala Lund K, MD, 500 mg at 07/04/22 2149   [MAR Hold] rosuvastatin (CRESTOR) tablet 20 mg, 20 mg, Oral, Daily, Lala Lund K, MD, 20 mg at 07/04/22 0905   [MAR Hold] senna-docusate (Senokot-S) tablet 2 tablet, 2 tablet, Oral, BID, Orma Flaming, MD, 2 tablet at 07/04/22 2146   [MAR Hold] traMADol (ULTRAM) tablet 50 mg, 50 mg, Oral, Q6H PRN, Thurnell Lose, MD, 50 mg at  07/03/22 1200 Labs CBC    Component Value Date/Time   WBC 6.6 07/05/2022 0100   RBC 2.53 (L) 07/05/2022 0100   HGB 8.2 (L) 07/05/2022 0100   HGB 10.2 (L) 04/20/2017 1151   HCT 25.0 (L) 07/05/2022 0100   HCT 29.6 (L) 04/20/2017 1151   PLT 209 07/05/2022 0100   PLT 385 (H) 04/20/2017 1151   MCV 98.8 07/05/2022 0100   MCV 89 04/20/2017 1151   MCH 32.4 07/05/2022 0100   MCHC 32.8 07/05/2022 0100   RDW 14.2 07/05/2022 0100   RDW 13.9 04/20/2017 1151   LYMPHSABS 1.0 07/05/2022 0100   LYMPHSABS 1.5 04/20/2017 1151   MONOABS 0.9 07/05/2022 0100   EOSABS 0.2 07/05/2022 0100   EOSABS 0.3 04/20/2017 1151   BASOSABS 0.0 07/05/2022 0100   BASOSABS 0.0 04/20/2017 1151  CMP     Component Value Date/Time   NA 134 (L) 07/05/2022 0100   NA 136 04/20/2017 1151   K 3.7 07/05/2022 0100   CL 110 07/05/2022 0100   CO2 18 (L) 07/05/2022 0100   GLUCOSE 120 (H) 07/05/2022 0100   BUN 19 07/05/2022 0100   BUN 24 04/20/2017 1151   CREATININE 0.64 07/05/2022 0100   CREATININE 0.75 03/05/2022 1139   CALCIUM 8.5 (L) 07/05/2022 0100   PROT 5.6 (L) 07/05/2022 0100   PROT 6.2 04/20/2017 1151   ALBUMIN 2.2 (L) 07/05/2022 0100   ALBUMIN 3.9 04/20/2017 1151   AST 166 (H) 07/05/2022 0100   ALT 199 (H) 07/05/2022 0100   ALKPHOS 74 07/05/2022 0100   BILITOT 0.4 07/05/2022 0100   BILITOT <0.2 04/20/2017 1151   GFRNONAA >60 07/05/2022 0100   GFRNONAA 50 (L) 07/10/2020 1021   GFRAA 58 (L) 07/10/2020 1021     Imaging I have reviewed images in epic and the results pertinent to this consultation are: CT brain without contrast, CT angio head and neck, MRI brain without contrast-all without any acute abnormality.  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 She got a temporal artery biopsy today. It would be worthwhile giving her a trial of steroids while results are pending No visual symptoms reported  Recommendations: Can start her on prednisone 40 daily after a small IV load of Solu-Medrol. Await temporal artery biopsy Family requesting pulmonology consult for persistent cough. Plan relayed to Dr. Candiss Norse and discussed with husband Dr. Lyla Son at bedside  -- Amie Portland, MD Neurologist [facility] Pager: [phone]

## 2022-07-05 NOTE — Consult Note (Signed)
 NAME:  Meghan Oliver, MRN:  [id number], DOB:  03/25/1941, LOS: 3 ADMISSION DATE:  07/01/2022, CONSULTATION DATE:  07/05/22 REFERRING MD:  Singh, CHIEF COMPLAINT:  pulmonary infiltrate    History of Present Illness:  Meghan Oliver is a 81 y.o. F with PMH significant for HFpEF, HTN, OSA, sinus node dysfunction with PPM, bipolar, depression who presented to the hospital with L temporal headaches and, dry cough and chills for three days.  She had one similar headache that lasted a day 1-2 months ago.  She denied photophobia or phonophobia, no vision changes.  She felt warm to the touch, but did not have fever prior to admission.  No nausea or vomiting.  CXR showed RUL pneumonia and she was admitted to [facility].  She was treated with Azithromycin, Ceftriaxone and steroids.  She had an elevated ESR and vascular surgery was consulted for temporal artery biopsy the results of which are pending.    Her procalcitonin was elevated at 2.05, this down-trended to 0.94, however a repeat CXR on 10/1 showed worsening R lung opacities.  She required 2-4L intermittent [place] oxygen. ESR continued to rise and   PCCM consulted for possible vasculitic infiltrate   Pertinent  Medical History   has a past medical history of Anxiety, Arthritis, Bipolar II disorder (HCC), CHF (congestive heart failure) (HCC), Chronic bronchitis (HCC), Chronic lower back pain, Chronic right hip pain, CKD (chronic kidney disease), stage II, Coronary artery disease, Esophagitis, erosive, GAD (generalized anxiety disorder), GERD (gastroesophageal reflux disease), Headache, Heart murmur, systolic, History of adenomatous polyp of colon, History of blood transfusion (12/2017), History of electroconvulsive therapy, History of hiatal hernia, Hyperlipidemia, Hypertension, Hypothyroidism, Internal carotid artery stenosis, bilateral, Major depression, chronic, Memory loss, Migraines, OSA (obstructive sleep apnea), Osteoporosis, Poor historian, Presence of permanent  cardiac pacemaker (03/17/2018), Pulmonary nodule, S/P placement of cardiac pacemaker 03/17/18 [facility] Jude  (03/18/2018), Short-term memory loss, and Sick sinus syndrome (HCC).   Significant Hospital Events: Including procedures, antibiotic start and stop dates in addition to other pertinent events   9/28 admitted to [facility] 10/2 temporal artery bx, PCCM consult  Interim History / Subjective:  Pt hemodynamically stable   Objective   Blood pressure 132/68, pulse 72, temperature 98.2 F (36.8 C), temperature source Oral, resp. rate 20, height 4' 10" (1.473 m), weight 40.8 kg, SpO2 96 %.        Intake/Output Summary (Last 24 hours) at 07/05/2022 1617 Last data filed at 07/05/2022 1535 Gross per 24 hour  Intake 1070 ml  Output 5 ml  Net 1065 ml   Filed Weights   07/01/22 0934 07/05/22 0828  Weight: 42.6 kg 40.8 kg   General:   HEENT: MM pink/moist Neuro: alert following commands, no deficit  CV: s1s2 RRR, no m/r/g PULM:  right rhonchi worse than left  GI: soft, bsx4 active  Extremities: warm/dry, no edema  Skin: no rashes or lesions  Resolved Hospital Problem list     Assessment & Plan:   Elevated ESR, CRP  Headaches s/p temporal artery biopsy, concern for GCA Worsening right sided infiltrate on CXR  Dry cough, no hemoptysis  P: Possible vasculitis?  With the worsening right sided infiltrate I think she needs an HRCT  Rheum labs pending  Empiric steroids are currently started  Pulm will follow up on the results of the CT   Labs   CBC: Recent Labs  Lab 07/01/22 1140 07/02/22 0737 07/03/22 0652 07/04/22 0625 07/05/22 0100  WBC 10.8* 8.9 7.5 6.6 6.6    NEUTROABS 9.1*  --  5.5 4.4 4.5  HGB 10.1* 9.1* 8.9* 8.8* 8.2*  HCT 32.3* 29.0* 26.9* 26.7* 25.0*  MCV 103.2* 103.6* 100.0 100.8* 98.8  PLT 179 174 183 172 583    Basic Metabolic Panel: Recent Labs  Lab 07/01/22 1140 07/02/22 0737 07/03/22 0652 07/04/22 0625 07/05/22 0100  NA 136 135 135 133* 134*  K 4.2 3.9  4.0 3.9 3.7  CL 105 107 109 111 110  CO2 22 24 19* 18* 18*  GLUCOSE 86 102* 101* 88 120*  BUN 24* _0 CREATININE 0.74 0.74 0.70 0.70 0.64  CALCIUM 9.0 8.0* 8.1* 8.0* 8.5*  MG  --  1.6* 2.5* 2.1 1.9   GFR: Estimated Creatinine Clearance: 34.9 mL/min (by C-G formula based on SCr of 0.64 mg/dL). Recent Labs  Lab 07/01/22 2052 07/02/22 0737 07/03/22 0652 07/04/22 0625 07/05/22 0100  PROCALCITON 2.05 1.51 0.94 0.59  --   WBC  --  8.9 7.5 6.6 6.6    Liver Function Tests: Recent Labs  Lab 07/03/22 0652 07/04/22 0625 07/05/22 0100  AST 133* 200* 166*  ALT 170* 214* 199*  ALKPHOS 62 72 74  BILITOT 0.4 0.4 0.4  PROT 5.5* 5.5* 5.6*  ALBUMIN 2.4* 2.2* 2.2*   No results for input(s): "LIPASE", "AMYLASE" in the last 168 hours. No results for input(s): "AMMONIA" in the last 168 hours.  ABG    Component Value Date/Time   PHART 7.588 (H) 05/16/2017 1441   PCO2ART 19.6 (LL) 05/16/2017 1441   PO2ART 143.0 (H) 05/16/2017 1441   HCO3 18.7 (L) 05/16/2017 1441   TCO2 19 05/16/2017 1441   ACIDBASEDEF 1.0 05/16/2017 1441   O2SAT 100.0 05/16/2017 1441     Coagulation Profile: Recent Labs  Lab 07/04/22 1045  INR 1.2    Cardiac Enzymes: No results for input(s): "CKTOTAL", "CKMB", "CKMBINDEX", "TROPONINI" in the last 168 hours.  HbA1C: Hgb A1c MFr Bld  Date/Time Value Ref Range Status  02/15/2011 03:45 PM 5.1 <5.7 % Final    Comment:                                                                           According to the ADA Clinical Practice Recommendations for 2011, when HbA1c is used as a screening test:     >=6.5%   Diagnostic of Diabetes Mellitus            (if abnormal result is confirmed)   5.7-6.4%   Increased risk of developing Diabetes Mellitus   References:Diagnosis and Classification of Diabetes Mellitus,Diabetes ENMM,7680,88(PJSRP 1):S62-S69 and Standards of Medical Care in         Diabetes - 2011,Diabetes RXYV,8592,92 (Suppl 1):S11-S61.     CBG: No results for input(s): "GLUCAP" in the last 168 hours.  Review of Systems:   Review of Systems  Constitutional:  Positive for malaise/fatigue. Negative for chills, fever and weight loss.  HENT:  Negative for hearing loss, sore throat and tinnitus.   Eyes:  Negative for blurred vision and double vision.  Respiratory:  Positive for hemoptysis and shortness of breath. Negative for cough, sputum production, wheezing and stridor.   Cardiovascular:  Negative for chest pain, palpitations, orthopnea, leg swelling and  PND.  Gastrointestinal:  Negative for abdominal pain, constipation, diarrhea, heartburn, nausea and vomiting.  Genitourinary:  Negative for dysuria, hematuria and urgency.  Musculoskeletal:  Negative for joint pain and myalgias.  Skin:  Negative for itching and rash.  Neurological:  Negative for dizziness, tingling, weakness and headaches.  Endo/Heme/Allergies:  Negative for environmental allergies. Does not bruise/bleed easily.  Psychiatric/Behavioral:  Negative for depression. The patient is not nervous/anxious and does not have insomnia.   All other systems reviewed and are negative.    Past Medical History:  She,  has a past medical history of Anxiety, Arthritis, Bipolar II disorder ([place]), CHF (congestive heart failure) ([place]), Chronic bronchitis ([place]), Chronic lower back pain, Chronic right hip pain, CKD (chronic kidney disease), stage II, Coronary artery disease, Esophagitis, erosive, GAD (generalized anxiety disorder), GERD (gastroesophageal reflux disease), Headache, Heart murmur, systolic, History of adenomatous polyp of colon, History of blood transfusion (12/2017), History of electroconvulsive therapy, History of hiatal hernia, Hyperlipidemia, Hypertension, Hypothyroidism, Internal carotid artery stenosis, bilateral, Major depression, chronic, Memory loss, Migraines, OSA (obstructive sleep apnea), Osteoporosis, Poor historian, Presence of permanent cardiac pacemaker  (03/17/2018), Pulmonary nodule, S/P placement of cardiac pacemaker 03/17/18 ST Jude  (03/18/2018), Short-term memory loss, and Sick sinus syndrome ([place]).   Surgical History:   Past Surgical History:  Procedure Laterality Date   APPENDECTOMY  1971   AUGMENTATION MAMMAPLASTY Bilateral    CARDIOVASCULAR STRESS TEST  01-30-2015  dr Aundra Dubin   Low risk nuclear study w/ no evidence ischemia or infarction/  normal LV funciton and wall motion , 76%   COLONOSCOPY W/ BIOPSIES AND POLYPECTOMY  "multiple"   COLONOSCOPY WITH ESOPHAGOGASTRODUODENOSCOPY (EGD)  last one 08-04-2016   CORONARY ANGIOPLASTY WITH STENT PLACEMENT  05/16/2017   "LAD"   CORONARY STENT INTERVENTION N/A 05/16/2017   Procedure: CORONARY STENT INTERVENTION;  Surgeon: Burnell Blanks, MD;  Location: [facility];  Service: Cardiovascular;  Laterality: N/A;   ESOPHAGOGASTRODUODENOSCOPY  02-26-04   ESOPHAGOGASTRODUODENOSCOPY  "multiple"   INSERT / REPLACE / REMOVE PACEMAKER  03/17/2018   LEFT HEART CATH AND CORONARY ANGIOGRAPHY N/A 05/16/2017   Procedure: LEFT HEART CATH AND CORONARY ANGIOGRAPHY;  Surgeon: Larey Dresser, MD;  Location: [facility];  Service: Cardiovascular;  Laterality: N/A;   OVARIAN CYST SURGERY  1970s   Laparotomy    PACEMAKER IMPLANT N/A 03/17/2018   Procedure: PACEMAKER IMPLANT;  Surgeon: Evans Lance, MD;  Location: [facility];  Service: Cardiovascular;  Laterality: N/A;   PORT-A-CATH PLACEMENT  05/31/2016; 2018   "@ [facility]; for ECT series"; "@ [facility] also"   PORT-A-CATH REMOVAL N/A 03/11/2017   Procedure: REMOVAL PORT-A-CATH;  Surgeon: Jackolyn Confer, MD;  Location: [facility];  Service: General;  Laterality: N/A;   PORTA CATH REMOVAL  03/17/2018   PORTA CATH REMOVAL  03/17/2018   Procedure: PORTA CATH REMOVAL;  Surgeon: Evans Lance, MD;  Location: [facility];  Service: Cardiovascular;;   TOTAL HIP ARTHROPLASTY Right 05/18/2022   Procedure: TOTAL HIP  ARTHROPLASTY ANTERIOR APPROACH;  Surgeon: Paralee Cancel, MD;  Location: [facility];  Service: Orthopedics;  Laterality: Right;   TRANSTHORACIC ECHOCARDIOGRAM  09/05/2013  dr Aundra Dubin   mild LVH, ef 62-26%, grade 1 diastolic dysfunction/  very mild AV stenosis with mild AR/  trivial MR and PT/ mild to moderate LAE/ mild TR/ mild pulmonary hypertension with PA peak pressure 16mHg   TUBAL LIGATION       Social History:   reports that  she quit smoking about 51 years ago. Her smoking use included cigarettes. She has a 30.00 pack-year smoking history. She has never used smokeless tobacco. She reports current alcohol use. She reports that she does not use drugs.   Family History:  Her family history includes Breast cancer in her paternal aunt and paternal grandmother; Heart attack (age of onset: 50) in her father; Heart disease in her father; Hypertension in her father. There is no history of Colon cancer.   Allergies Allergies  Allergen Reactions   Propranolol Other (See Comments)    Low blood pressure   Brexpiprazole Other (See Comments)    Aphasia and catatonia     Home Medications  Prior to Admission medications   Medication Sig Start Date End Date Taking? Authorizing Provider  acetaminophen (TYLENOL) 500 MG tablet Take 2 tablets (1,000 mg total) by mouth every 6 (six) hours. 05/19/22  Yes Donovan, Ashley R, PA-C  Calcium Carbonate-Vitamin D (CALCIUM 600/VITAMIN D PO) Take 1 tablet by mouth 2 (two) times daily.   Yes [provider]  cetirizine (ZYRTEC) 10 MG tablet Take 10 mg by mouth daily as needed for allergies.   Yes [provider]  Cholecalciferol (VITAMIN D3) 50 MCG (2000 UT) TABS Take 2,000 Units by mouth in the morning.   Yes [provider]  dexlansoprazole (DEXILANT) 60 MG capsule Take 1 capsule by mouth daily Patient taking differently: Take 60 mg by mouth daily. 10/22/21  Yes Nandigam, Kavitha V, MD  fesoterodine (TOVIAZ) 8 MG TB24 tablet Take 1 tablet (8  mg total) by mouth daily. 02/18/22  Yes Baxley, Mary J, MD  Krill Oil 500 MG CAPS Take 500 mg by mouth daily.   Yes [provider]  Lavender Oil 80 MG CAPS Take 160 mg by mouth at bedtime.   Yes [provider]  levothyroxine (SYNTHROID) 75 MCG tablet Take 1 tablet (75 mcg total) by mouth daily. 05/12/22  Yes Baxley, Mary J, MD  linaclotide (LINZESS) 72 MCG capsule Take 1 capsule (72 mcg total) by mouth in the morning and at bedtime. 06/16/22  Yes Nandigam, Kavitha V, MD  lithium carbonate 300 MG capsule Take 1 capsule (300 mg total) by mouth at bedtime. 05/12/22  Yes Cottle, Carey G Jr., MD  LORazepam (ATIVAN) 0.5 MG tablet TAKE 1/2 TO 1 TABLET BY MOUTH EVERY NIGHT AT BEDTIME AND 1 TABLET EVERY 8 HOURS AS NEEDED FOR ANXIETY Patient taking differently: Take 0.25-0.5 mg by mouth See admin instructions. TAKE 1/2 TO 1 TABLET BY MOUTH EVERY NIGHT AT BEDTIME AND 1 TABLET EVERY 8 HOURS AS NEEDED FOR ANXIETY 03/10/22  Yes Cottle, Carey G Jr., MD  memantine (NAMENDA) 10 MG tablet Take 1 tablet (10 mg total) by mouth 2 (two) times daily. 03/10/22  Yes Cottle, Carey G Jr., MD  methocarbamol (ROBAXIN) 500 MG tablet Take 1 tablet (500 mg total) by mouth every 6 (six) hours as needed for muscle spasms. 05/19/22  Yes Donovan, Ashley R, PA-C  mirtazapine (REMERON) 7.5 MG tablet Take 1 tablet (7.5 mg total) by mouth at bedtime. 03/10/22  Yes Cottle, Carey G Jr., MD  Multiple Vitamins-Minerals (MULTIVITAMIN WITH MINERALS) tablet Take 1 tablet by mouth daily.   Yes [provider]  Multiple Vitamins-Minerals (PRESERVISION AREDS 2 PO) Take 1 capsule by mouth in the morning and at bedtime.   Yes [provider]  naloxegol oxalate (MOVANTIK) 12.5 MG TABS tablet Take 2 tablets (25 mg total) by mouth daily. Start with 1 tablet   daily and if tolerating well for a week increase to 2 tablets daily 05/24/22  Yes Nandigam, Venia Minks, MD  nortriptyline (PAMELOR) 75 MG capsule Take 1 capsule (75 mg total) by  mouth at bedtime. 03/10/22  Yes Cottle, Billey Co., MD  ondansetron (ZOFRAN-ODT) 4 MG disintegrating tablet Place 1 tablet every 6 hours by translingual route as needed. 05/20/22  Yes   PARoxetine (PAXIL) 20 MG tablet Take 1 tablet (20 mg total) by mouth at bedtime. 05/10/22  Yes Cottle, Billey Co., MD  polyethylene glycol (MIRALAX / GLYCOLAX) 17 g packet Take 17 g by mouth daily as needed for mild constipation.   Yes [provider]  primidone (MYSOLINE) 50 MG tablet Take 2 tablets by mouth every morning and take 1 tablet by mouth every evening 05/14/22  Yes Cottle, Billey Co., MD  Pyridoxine HCl (VITAMIN B-6) 500 MG tablet Take 500 mg by mouth 2 (two) times daily.   Yes [provider]  rosuvastatin (CRESTOR) 40 MG tablet TAKE 1 TABLET (40 MG) BY MOUTH DAILY Patient taking differently: Take 40 mg by mouth daily. 01/12/22  Yes Baxley, Cresenciano Lick, MD  sennosides-docusate sodium (SENOKOT-S) 8.6-50 MG tablet Take 2 tablets by mouth at bedtime.   Yes [provider]  traMADol (ULTRAM) 50 MG tablet Take 1-2 tablets (50-100 mg total) by mouth every 6 (six) hours as needed for moderate pain or severe pain. 05/19/22  Yes Irving Copas, PA-C  valACYclovir (VALTREX) 1000 MG tablet Take 2 tablets by mouth at onset, then 2 tablets 12 hours later Patient taking differently: Take 2,000 mg by mouth as directed. 07/15/21  Yes   vitamin B-12 (CYANOCOBALAMIN) 1000 MCG tablet Take 1,000 mcg by mouth at bedtime.   Yes [provider]  primidone (MYSOLINE) 50 MG tablet TAKE 2 TABLETS BY MOUTH EVERY MORNING AND 1 TABLET EVERY EVENING Patient not taking: Reported on 07/01/2022 05/12/22   Cottle, Billey Co., MD    Garner Nash, DO [facility] 07/05/2022 6:38 PM

## 2022-07-05 NOTE — Progress Notes (Signed)
  Daily Progress Note   Subjective: Resting comfortably. Not alert this morning, sundowning overnight    Objective: Vitals:   07/05/22 0743 07/05/22 0828  BP: (!) 139/58 133/60  Pulse: 74 71  Resp: (!) 21 17  Temp: 99.3 F (37.4 C) 98.9 F (37.2 C)  SpO2: 97% 95%    Physical Examination Resting comfortably Regular rate Non-labored breathing Mental status - somnolent, not oriented, tired   ASSESSMENT/PLAN:  Appreciate help from her full-time nurse this morning.  Plan for LEFT temporal artery biopsy today Discussed risks and benefits with her nurse as well as her Husband who elected to proceed.    Cassandria Santee MD MS [facility] [phone] 07/05/2022  8:45 AM

## 2022-07-05 NOTE — Anesthesia Postprocedure Evaluation (Signed)
Anesthesia Post Note  Patient: Meghan Oliver  Procedure(s) Performed: BIOPSY TEMPORAL ARTERY (Left)     Patient location during evaluation: PACU Anesthesia Type: General Level of consciousness: awake and alert Pain management: pain level controlled Vital Signs Assessment: post-procedure vital signs reviewed and stable Respiratory status: spontaneous breathing, nonlabored ventilation and respiratory function stable Cardiovascular status: blood pressure returned to baseline Postop Assessment: no apparent nausea or vomiting Anesthetic complications: no   No notable events documented.  Last Vitals:  Vitals:   07/05/22 1057 07/05/22 1200  BP: (!) 130/57 132/68  Pulse: 71 72  Resp: 19 20  Temp: 36.8 C   SpO2: 97% 96%    Last Pain:  Vitals:   07/05/22 1057  TempSrc: Oral  PainSc: 0-No pain                 Marthenia Rolling

## 2022-07-05 NOTE — Progress Notes (Signed)
Pt taken to OR for procedure.

## 2022-07-05 NOTE — Progress Notes (Signed)
PT Cancellation Note  Patient Details Name: Meghan Oliver MRN: [id number] DOB: 10-03-1941   Cancelled Treatment:    Reason Eval/Treat Not Completed: (P) Fatigue/lethargy limiting ability to participate (pt currently lethargic post-procedure per RN.) Will continue efforts per PT plan of care as schedule permits.   [name] M Treysean Petruzzi 07/05/2022, 11:25 AM

## 2022-07-05 NOTE — Transfer of Care (Signed)
Immediate Anesthesia Transfer of Care Note  Patient: Meghan Oliver  Procedure(s) Performed: BIOPSY TEMPORAL ARTERY (Left)  Patient Location: PACU  Anesthesia Type:General  Level of Consciousness: drowsy, patient cooperative and responds to stimulation  Airway & Oxygen Therapy: Patient Spontanous Breathing and Patient connected to nasal cannula oxygen  Post-op Assessment: Report given to RN, Post -op Vital signs reviewed and stable and Patient moving all extremities X 4  Post vital signs: Reviewed and stable  Last Vitals:  Vitals Value Taken Time  BP 120/58 07/05/22 1000  Temp 36.5 C 07/05/22 1000  Pulse 68 07/05/22 1005  Resp 17 07/05/22 1005  SpO2 91 % 07/05/22 1005  Vitals shown include unvalidated device data.  Last Pain:  Vitals:   07/05/22 1000  TempSrc:   PainSc: Asleep         Complications: No notable events documented.

## 2022-07-05 NOTE — Op Note (Signed)
    NAME: [name]    MRN: [id number] DOB: 1941-05-25    DATE OF OPERATION: 07/05/2022  PREOP DIAGNOSIS:    Temporal arteritis  POSTOP DIAGNOSIS:    Same  PROCEDURE:    Left temporal artery biopsy  SURGEON: Broadus John  ASSIST: None  ANESTHESIA:   EBL: 10 mL  INDICATIONS:   [name] is a 81 y.o. female who was admitted on 07/01/2022 with a severe headache and cough.  She had been having a left temporal headache for 3 days.  She had an elevated sed rate and for this reason temporal arteritis was entertained.  We were asked to perform a left temporal artery biopsy.  FINDINGS:   3 cm of left-sided temporal artery resected  TECHNIQUE:   Patient is brought to the OR laid in supine position.  General esthesia was induced and the patient was prepped draped in standard fashion.  The case began with manual palpation of the left temporal artery.  Its path was marked and a 4 cm longitudinal incision was made immediately anterior superior to the tragus.  This was carried down to the temporal artery and vein.  The vein was excised, exposing the temporal artery.  This was controlled using small clips and 3-0 silk suture.  3 cm of temporal artery was resected, and sent to pathology.  The wound was irrigated with saline and closed in layers using Vicryl, with Monocryl and Dermabond at the level of the skin.  Macie Burows, MD [facility] DATE OF DICTATION:   07/05/2022

## 2022-07-05 NOTE — Anesthesia Preprocedure Evaluation (Addendum)
Anesthesia Evaluation  Patient identified by MRN, date of birth, ID band Patient awake and Patient confused    Reviewed: Allergy & Precautions, NPO status , Patient's Chart, lab work & pertinent test results  History of Anesthesia Complications Negative for: history of anesthetic complications  Airway Mallampati: II  TM Distance: >3 FB Neck ROM: Full    Dental no notable dental hx.    Pulmonary sleep apnea , pneumonia, former smoker,    Pulmonary exam normal        Cardiovascular hypertension, + CAD and +CHF  Normal cardiovascular exam+ pacemaker (for SSS)   TTE 2023: EF 65-70%, mildly elevated PASP 37.81mHg, moderate LAE, mild to moderate MR, mild to moderate AR    Neuro/Psych  Headaches, Anxiety Depression Bipolar Disorder    GI/Hepatic Neg liver ROS, PUD, GERD  Medicated and Controlled,  Endo/Other  Hypothyroidism   Renal/GU negative Renal ROS  negative genitourinary   Musculoskeletal  (+) Arthritis ,   Abdominal   Peds  Hematology  (+) Blood dyscrasia (Hgb 8.2), anemia ,   Anesthesia Other Findings Day of surgery medications reviewed with patient.  Reproductive/Obstetrics negative OB ROS                            Anesthesia Physical Anesthesia Plan  ASA: 3  Anesthesia Plan: General   Post-op Pain Management:    Induction: Intravenous  PONV Risk Score and Plan: 3 and Treatment may vary due to age or medical condition, Ondansetron and Propofol infusion  Airway Management Planned: LMA  Additional Equipment: None  Intra-op Plan:   Post-operative Plan: Extubation in OR  Informed Consent: I have reviewed the patients History and Physical, chart, labs and discussed the procedure including the risks, benefits and alternatives for the proposed anesthesia with the patient or authorized representative who has indicated his/her understanding and acceptance.     Dental advisory  given and Consent reviewed with POA  Plan Discussed with: CRNA  Anesthesia Plan Comments: (Consent obtained from patient's husband via telephone. -Daiva Huge MD)        Anesthesia Quick Evaluation

## 2022-07-05 NOTE — Anesthesia Procedure Notes (Signed)
Procedure Name: LMA Insertion Date/Time: 07/05/2022 9:03 AM  Performed by: Claris Che, CRNAPre-anesthesia Checklist: Patient identified, Emergency Drugs available, Suction available, Patient being monitored and Timeout performed Patient Re-evaluated:Patient Re-evaluated prior to induction Oxygen Delivery Method: Circle system utilized Preoxygenation: Pre-oxygenation with 100% oxygen Induction Type: IV induction Ventilation: Mask ventilation with difficulty LMA: LMA inserted LMA Size: 4.0 Number of attempts: 1 Placement Confirmation: positive ETCO2 and breath sounds checked- equal and bilateral Tube secured with: Tape Dental Injury: Teeth and Oropharynx as per pre-operative assessment

## 2022-07-06 ENCOUNTER — Encounter (HOSPITAL_COMMUNITY): Payer: Self-pay | Admitting: Vascular Surgery

## 2022-07-06 DIAGNOSIS — J90 Pleural effusion, not elsewhere classified: Secondary | ICD-10-CM | POA: Diagnosis not present

## 2022-07-06 DIAGNOSIS — R700 Elevated erythrocyte sedimentation rate: Secondary | ICD-10-CM

## 2022-07-06 DIAGNOSIS — R519 Headache, unspecified: Secondary | ICD-10-CM | POA: Diagnosis not present

## 2022-07-06 DIAGNOSIS — R7989 Other specified abnormal findings of blood chemistry: Secondary | ICD-10-CM

## 2022-07-06 DIAGNOSIS — J189 Pneumonia, unspecified organism: Secondary | ICD-10-CM | POA: Diagnosis not present

## 2022-07-06 LAB — COMPREHENSIVE METABOLIC PANEL WITH GFR
ALT: 245 U/L — ABNORMAL HIGH (ref 0–44)
AST: 272 U/L — ABNORMAL HIGH (ref 15–41)
Albumin: 2.2 g/dL — ABNORMAL LOW (ref 3.5–5.0)
Alkaline Phosphatase: 78 U/L (ref 38–126)
Anion gap: 7 (ref 5–15)
BUN: 21 mg/dL (ref 8–23)
CO2: 19 mmol/L — ABNORMAL LOW (ref 22–32)
Calcium: 8.8 mg/dL — ABNORMAL LOW (ref 8.9–10.3)
Chloride: 111 mmol/L (ref 98–111)
Creatinine, Ser: 0.73 mg/dL (ref 0.44–1.00)
GFR, Estimated: >60 mL/min
Glucose, Bld: 125 mg/dL — ABNORMAL HIGH (ref 70–99)
Potassium: 4.3 mmol/L (ref 3.5–5.1)
Sodium: 137 mmol/L (ref 135–145)
Total Bilirubin: 0.1 mg/dL — ABNORMAL LOW (ref 0.3–1.2)
Total Protein: 5.8 g/dL — ABNORMAL LOW (ref 6.5–8.1)

## 2022-07-06 LAB — CBC WITH DIFFERENTIAL/PLATELET
Abs Immature Granulocytes: 0.03 K/uL (ref 0.00–0.07)
Basophils Absolute: 0 K/uL (ref 0.0–0.1)
Basophils Relative: 0 %
Eosinophils Absolute: 0.1 K/uL (ref 0.0–0.5)
Eosinophils Relative: 2 %
HCT: 28.6 % — ABNORMAL LOW (ref 36.0–46.0)
Hemoglobin: 9.3 g/dL — ABNORMAL LOW (ref 12.0–15.0)
Immature Granulocytes: 0 %
Lymphocytes Relative: 20 %
Lymphs Abs: 1.4 K/uL (ref 0.7–4.0)
MCH: 32.4 pg (ref 26.0–34.0)
MCHC: 32.5 g/dL (ref 30.0–36.0)
MCV: 99.7 fL (ref 80.0–100.0)
Monocytes Absolute: 0.8 K/uL (ref 0.1–1.0)
Monocytes Relative: 12 %
Neutro Abs: 4.8 K/uL (ref 1.7–7.7)
Neutrophils Relative %: 66 %
Platelets: 259 K/uL (ref 150–400)
RBC: 2.87 MIL/uL — ABNORMAL LOW (ref 3.87–5.11)
RDW: 14.6 % (ref 11.5–15.5)
WBC: 7.2 K/uL (ref 4.0–10.5)
nRBC: 0 % (ref 0.0–0.2)

## 2022-07-06 LAB — ANCA TITERS
Atypical P-ANCA titer: <1:20 {titer}
C-ANCA: <1:20 {titer}
P-ANCA: <1:20 {titer}

## 2022-07-06 LAB — SEDIMENTATION RATE: Sed Rate: 86 mm/h — ABNORMAL HIGH (ref 0–22)

## 2022-07-06 LAB — CYCLIC CITRUL PEPTIDE ANTIBODY, IGG/IGA: CCP Antibodies IgG/IgA: 2 units (ref 0–19)

## 2022-07-06 LAB — BRAIN NATRIURETIC PEPTIDE: B Natriuretic Peptide: 287.2 pg/mL — ABNORMAL HIGH (ref 0.0–100.0)

## 2022-07-06 LAB — ANA W/REFLEX IF POSITIVE: Anti Nuclear Antibody (ANA): NEGATIVE

## 2022-07-06 LAB — RHEUMATOID FACTOR: Rheumatoid fact SerPl-aCnc: 18.2 IU/mL — ABNORMAL HIGH (ref <14.0)

## 2022-07-06 LAB — MAGNESIUM: Magnesium: 2 mg/dL (ref 1.7–2.4)

## 2022-07-06 LAB — SURGICAL PATHOLOGY

## 2022-07-06 LAB — ANTI-DNA ANTIBODY, DOUBLE-STRANDED: ds DNA Ab: 1 IU/mL (ref 0–9)

## 2022-07-06 MED ORDER — PREDNISONE 20 MG PO TABS
60.0000 mg | ORAL_TABLET | Freq: Every day | ORAL | Status: DC
  Administered 2022-07-06 – 2022-07-07 (×2): 60 mg via ORAL
  Filled 2022-07-06 (×2): qty 3

## 2022-07-06 NOTE — Progress Notes (Signed)
Mobility Specialist Progress Note:   07/06/22 1400  Mobility  Activity  (bed level ex + mobility)  Activity Response Tolerated fair  $Mobility charge 1 Mobility  Level of Assistance +2 (takes two people)  Assistive Device None  HOB Elevated/Bed Position HOB 90   Pt asleep upon arrival this am, easily aroused to knock on door. Pt politely declining any OOB mobility d/t not feeling well. Pt displaying jerk-like movement throughout session. Sat up to eat breakfast however unable to feed herself d/t jerking (NT notified as caregiver not present at time of session). Performed multiple bed level exercises with no complaints. Left with all needs met, bed alarm on.   Nelta Numbers [facility] Secure Chat or Office Phone: [phone]

## 2022-07-06 NOTE — Care Management Important Message (Signed)
Important Message  Patient Details  Name: Meghan Oliver MRN: [id number] Date of Birth: 03/17/1941   Medicare Important Message Given:  Yes     [name] 07/06/2022, 2:25 PM

## 2022-07-06 NOTE — Progress Notes (Addendum)
NAME:  Meghan Oliver, MRN:  [id number], DOB:  [birth year], LOS: 4 ADMISSION DATE:  07/01/2022, CONSULTATION DATE:  07/06/22 REFERRING MD:  Candiss Norse, CHIEF COMPLAINT:  pulmonary infiltrate    History of Present Illness:  Meghan Oliver is a 81 y.o. F with PMH significant for HFpEF, HTN, OSA, sinus node dysfunction with PPM, bipolar, depression who presented to the hospital with L temporal headaches and, dry cough and chills for three days.  She had one similar headache that lasted a day 1-2 months ago.  She denied photophobia or phonophobia, no vision changes.  She felt warm to the touch, but did not have fever prior to admission.  No nausea or vomiting.  CXR showed RUL pneumonia and she was admitted to [facility].  She was treated with Azithromycin, Ceftriaxone and steroids.  She had an elevated ESR and vascular surgery was consulted for temporal artery biopsy the results of which are pending.    Her procalcitonin was elevated at 2.05, this down-trended to 0.94, however a repeat CXR on 10/1 showed worsening R lung opacities.  She required 2-4L intermittent [place] oxygen. ESR continued to rise and   PCCM consulted for possible vasculitic infiltrate  Pertinent  Medical History   has a past medical history of Anxiety, Arthritis, Bipolar II disorder ([place]), CHF (congestive heart failure) (HCC), Chronic bronchitis ([place]), Chronic lower back pain, Chronic right hip pain, CKD (chronic kidney disease), stage II, Coronary artery disease, Esophagitis, erosive, GAD (generalized anxiety disorder), GERD (gastroesophageal reflux disease), Headache, Heart murmur, systolic, History of adenomatous polyp of colon, History of blood transfusion (12/2017), History of electroconvulsive therapy, History of hiatal hernia, Hyperlipidemia, Hypertension, Hypothyroidism, Internal carotid artery stenosis, bilateral, Major depression, chronic, Memory loss, Migraines, OSA (obstructive sleep apnea), Osteoporosis, Poor historian, Presence of permanent  cardiac pacemaker (03/17/2018), Pulmonary nodule, S/P placement of cardiac pacemaker 03/17/18 [employer]  (03/18/2018), Short-term memory loss, and Sick sinus syndrome ([place]).   Significant Hospital Events: Including procedures, antibiotic start and stop dates in addition to other pertinent events   9/28 admitted to [facility] 10/2 temporal artery bx, PCCM consult  Interim History / Subjective:   Remains on room air.  Started on steroids High-resolution CT done and reviewed images  Objective   Blood pressure (!) 119/52, pulse 63, temperature 98.1 F (36.7 C), temperature source Oral, resp. rate 15, height _0  (1.473 m), weight 40.8 kg, SpO2 94 %.        Intake/Output Summary (Last 24 hours) at 07/06/2022 1156 Last data filed at 07/05/2022 1700 Gross per 24 hour  Intake 350 ml  Output 800 ml  Net -450 ml    Filed Weights   07/01/22 0934 07/05/22 0828  Weight: 42.6 kg 40.8 kg   Gen:      No acute distress, sleeping comfortably HEENT:  EOMI, sclera anicteric Neck:     No masses; no thyromegaly Lungs:    Clear to auscultation bilaterally; normal respiratory effort CV:         Regular rate and rhythm; no murmurs Abd:      + bowel sounds; soft, non-tender; no palpable masses, no distension Ext:    No edema; adequate peripheral perfusion Skin:      Warm and dry; no rash Neuro: Sleeping  High-resolution CT 07/05/2022-moderate effusion left greater than right, patchy airspace consolidation most severe in the right upper and lower lobes.  Temporal artery biopsy 07/05/2022-no evidence of arteritis or granuloma  ANA 07/05/2022-negative, double-stranded DNA 1  Resolved Hospital Problem list  Assessment & Plan:  Community-acquired pneumonia on the right There is concern for vasculitis though review of high-res CT shows current findings more consistent with pneumonia ANA is negative, ANCA, CCP and rheumatoid factors are pending Temporal artery biopsy results noted with no evidence of  arteritis or granuloma  On steroids for now which will also benefit with community-acquired pneumonia.   Continue antibiotics Will examine pleural effusion to see if there is enough fluid for drainage We will arrange for CT follow-up to ensure that there is no underlying interstitial changes.  Signature:   Marshell Garfinkel MD [facility] See Amion for pager  If no response to pager , please call [phone] until 7pm After 7:00 pm call Elink  [phone] 07/06/2022, 12:14 PM

## 2022-07-06 NOTE — Progress Notes (Cosign Needed)
Physical Therapy Treatment Patient Details Name: Meghan Oliver MRN: [id number] DOB: 12-26-1940 Today's Date: 07/06/2022   History of Present Illness Pt is an 81 year old female presented to [facility] with 3 day history of cough, chills and a severe headache. +pna;  Right THA on 05/18/22. s/p temporal artery bx on 10/2.  PMHx significant for but not limited to: memory loss, bipolar II, PPM, CHF, chronic low back pain, HTN    PT Comments    Pt received seated on BSC, caregiver present, pt lethargic and agreeable to therapy session. Emphasis on transfer training, use of RW, seated LE exercises for strengthening and seated/standing balance. Pt remained lethargic throughout with frequent full-body UE/LE hypnic jerk-like movements while seated and standing, limiting mobility progression to short pivotal transfers. Pt performed transfers and pre-gait tasks with +2 maxA and occasional totalA +2 due to many episodes of buckling and pt unable to maintain grasp on RW. Per caregiver, pt/family not agreeable to post-acute therapies and prefer HHPT once pt more alert and able to follow commands consistently without buckling. If pt does DC home today, would need wheelchair and ambulance transport due to continued lethargy, discussed with supervising PT Ginger Carne and pt/caregiver.   Recommendations for follow up therapy are one component of a multi-disciplinary discharge planning process, led by the attending physician.  Recommendations may be updated based on patient status, additional functional criteria and insurance authorization.  Follow Up Recommendations  Home health PT     Assistance Recommended at Discharge Intermittent Supervision/Assistance  Patient can return home with the following A little help with walking and/or transfers;A little help with bathing/dressing/bathroom;Assistance with cooking/housework;Direct supervision/assist for medications management;Direct supervision/assist for financial management;Assist  for transportation;Help with stairs or ramp for entrance   Equipment Recommendations  Wheelchair (measurements PT);Wheelchair cushion (measurements PT) (pending progress; pt currently too lethargic to safely ambulate in room)    Recommendations for Other Services       Precautions / Restrictions Precautions Precautions: Fall Precaution Comments: lethargy/buckling Restrictions Weight Bearing Restrictions: No     Mobility   Transfers Overall transfer level: Needs assistance Equipment used: Rolling walker (2 wheels) Transfers: Sit to/from Stand Sit to Stand: Max assist, +2 physical assistance           General transfer comment: cues for hand placement and weight shifting, pt with good effort but frequently buckles knees and impulsively lets go of RW handles with full-body jerking intermittently    Ambulation/Gait Ambulation/Gait assistance: Max assist, +2 physical assistance Gait Distance (Feet): 5 Feet Assistive device: Rolling walker (2 wheels) Gait Pattern/deviations: Step-to pattern, Decreased dorsiflexion - right, Decreased dorsiflexion - left, Knees buckling, Trunk flexed       General Gait Details: Pt with frequent episodes of buckling with BUE grasp release (like a hypnic jerk) and remains lethargic throughout. Pt needing constant +2 mod to maxA with gait belt and RW and had difficulty maintaining proximity to RW. Distance limited due to lethargy/buckling, pt also performed another pivotal transfer from EOB>chair but still buckling so defer additional gait trials for pt safety.      Balance Overall balance assessment: Needs assistance Sitting-balance support: Feet supported, Bilateral upper extremity supported, No upper extremity supported Sitting balance-Leahy Scale: Poor Sitting balance - Comments: varying min to maxA due to lethargy     Standing balance-Leahy Scale: Zero Standing balance comment: frequent +2 maxA with RW due to buckling  Cognition Arousal/Alertness: Lethargic, Suspect due to medications Behavior During Therapy: Flat affect Overall Cognitive Status: Impaired/Different from baseline Area of Impairment: Problem solving, Awareness, Memory, Following commands, Attention                   Current Attention Level: Focused Memory: Decreased short-term memory Following Commands: Follows one step commands inconsistently   Awareness: Intellectual Problem Solving: Slow processing, Difficulty sequencing, Requires verbal cues, Requires tactile cues General Comments: Pt very lethargic with frequent hypnic jerks while attempting transfers and gait (very limited distance at bedside). Altered plan to pivot transfers rather than gait due to pt lethargy and difficulty maintaining grip on RW handles with frequent hypnic appearing jerks. Pt unable to maintain eyes open unless cued. Pt RN caregiver present and reports this is not her baseline.        Exercises Other Exercises Other Exercises: seated BLE A/AAROM: hip flexion, LAQ x10 reps ea (mod cues for technique)    General Comments General comments (skin integrity, edema, etc.): BP stable supine/seated postures, SpO2 WFL, HR WFL.      Pertinent Vitals/Pain Pain Assessment Pain Assessment: No/denies pain Pain Intervention(s): Monitored during session, Repositioned     PT Goals (current goals can now be found in the care plan section) Acute Rehab PT Goals Patient Stated Goal: per caregiver, to go home with spouse once more physically stable PT Goal Formulation: Patient unable to participate in goal setting Time For Goal Achievement: 07/17/22 Progress towards PT goals: Not progressing toward goals - comment (more lethargic this date)    Frequency    Min 3X/week      PT Plan Current plan remains appropriate;Equipment recommendations need to be updated    Co-evaluation PT/OT/SLP Co-Evaluation/Treatment: Yes Reason for  Co-Treatment: For patient/therapist safety;To address functional/ADL transfers PT goals addressed during session: Mobility/safety with mobility;Balance;Proper use of DME;Strengthening/ROM        AM-PAC PT "6 Clicks" Mobility   Outcome Measure  Help needed turning from your back to your side while in a flat bed without using bedrails?: A Lot Help needed moving from lying on your back to sitting on the side of a flat bed without using bedrails?: A Lot Help needed moving to and from a bed to a chair (including a wheelchair)?: Total Help needed standing up from a chair using your arms (e.g., wheelchair or bedside chair)?: Total Help needed to walk in hospital room?: Total Help needed climbing 3-5 steps with a railing? : Total 6 Click Score: 8    End of Session Equipment Utilized During Treatment: Gait belt Activity Tolerance: Patient limited by lethargy Patient left: with call bell/phone within reach;with nursing/sitter in room;in chair;with family/visitor present (private caregiver RN [name] present, reports she will remain present in room so no chair alarm needed for now) Nurse Communication: Mobility status;Other (comment) (frequent buckling/lethargy not safe to DC unless she has ambulance transport and WC today) PT Visit Diagnosis: Unsteadiness on feet (R26.81);Other abnormalities of gait and mobility (R26.89);Muscle weakness (generalized) (M62.81)     Time: 3007-6226 PT Time Calculation (min) (ACUTE ONLY): 29 min  Charges:  $Therapeutic Activity: 8-22 mins                     Arline Ketter P., PTA Acute Rehabilitation Services Secure Chat Preferred 9a-5:30pm Office: [place] 07/06/2022, 12:13 PM

## 2022-07-06 NOTE — Progress Notes (Signed)
Occupational Therapy Treatment Patient Details Name: Meghan Oliver MRN: [id number] DOB: [birth year] Today's Date: 07/06/2022   History of present illness Pt is an 81 year old female presented to [facility] with 3 day history of cough, chills and a severe headache. +pna;  Right THA on 05/18/22. s/p temporal artery bx on 10/2.  PMHx significant for but not limited to: memory loss, bipolar II, PPM, CHF, chronic low back pain, HTN   OT comments  Patient on [facility] upon entry with caregiver present. Patient stood to [facility] for toilet hygiene requiring assistance of 2 for balance due to knee buckling and jerk-like movements. Patient required assistance to donn brief while seated and standing and hand over hand assist to brush teeth while seated. Patient's discharge recommendations have changed from no OT follow-up to [place] OT due to requiring more assistance with self care and functional transfers. Patients current plan is to return to ALF and receive therapy.  Acute OT to continue to follow.    Recommendations for follow up therapy are one component of a multi-disciplinary discharge planning process, led by the attending physician.  Recommendations may be updated based on patient status, additional functional criteria and insurance authorization.    Follow Up Recommendations  Home health OT    Assistance Recommended at Discharge Intermittent Supervision/Assistance  Patient can return home with the following  Assist for transportation;Direct supervision/assist for medications management;A little help with walking and/or transfers   Equipment Recommendations  None recommended by OT    Recommendations for Other Services      Precautions / Restrictions Precautions Precautions: Fall Precaution Comments: lethargy/buckling Restrictions Weight Bearing Restrictions: No       Mobility Bed Mobility Overal bed mobility: Needs Assistance             General bed mobility comments: OOB upon entry     Transfers Overall transfer level: Needs assistance Equipment used: Rolling walker (2 wheels) Transfers: Sit to/from Stand Sit to Stand: Max assist, +2 physical assistance           General transfer comment: cues for hand placement and weight shifting, pt with good effort but frequently buckles knees and impulsively lets go of RW handles with full-body jerking intermittently     Balance Overall balance assessment: Needs assistance Sitting-balance support: Feet supported, Bilateral upper extremity supported, No upper extremity supported Sitting balance-Leahy Scale: Poor Sitting balance - Comments: varying min to maxA due to lethargy     Standing balance-Leahy Scale: Zero Standing balance comment: frequent +2 maxA with RW due to buckling                           ADL either performed or assessed with clinical judgement   ADL Overall ADL's : Needs assistance/impaired     Grooming: Wash/dry hands;Wash/dry face;Oral care;Brushing hair;Moderate assistance;Sitting Grooming Details (indicate cue type and reason): performed seated in recliner with assistance to hold toothbrush and brush back of hair                 Toilet Transfer: Maximal assistance;+2 for physical assistance Toilet Transfer Details (indicate cue type and reason): patient on BSC upon arrival and transferred from [facility] to EOB with max assist +2 due to knee buckling Toileting- Clothing Manipulation and Hygiene: Maximal assistance;+2 for physical assistance;Sit to/from stand Toileting - Clothing Manipulation Details (indicate cue type and reason): stood with RW requiring max assist +2 to stand and hygiene performed by another  General ADL Comments: rerquired increased assistance for self care and functional transfers on this session    Extremity/Trunk Assessment              Vision       Perception     Praxis      Cognition Arousal/Alertness: Lethargic, Suspect due to  medications Behavior During Therapy: Flat affect Overall Cognitive Status: Impaired/Different from baseline Area of Impairment: Problem solving, Awareness, Memory, Following commands, Attention                   Current Attention Level: Focused Memory: Decreased short-term memory Following Commands: Follows one step commands inconsistently   Awareness: Intellectual Problem Solving: Slow processing, Difficulty sequencing, Requires verbal cues, Requires tactile cues General Comments: lethargic during session with frequent jerky movements with UEs and LEs.        Exercises      Shoulder Instructions       General Comments BP stable supine/seated postures, SpO2 WFL, HR WFL.    Pertinent Vitals/ Pain       Pain Assessment Pain Assessment: No/denies pain Pain Intervention(s): Monitored during session  Home Living                                          Prior Functioning/Environment              Frequency  Min 2X/week        Progress Toward Goals  OT Goals(current goals can now be found in the care plan section)  Progress towards OT goals: Not progressing toward goals - comment (patient currently requiring more assistance with self care and functional transfers)  Acute Rehab OT Goals Patient Stated Goal: get better OT Goal Formulation: With patient/family Time For Goal Achievement: 07/16/22 Potential to Achieve Goals: Good ADL Goals Pt Will Perform Grooming: Independently;standing Pt Will Perform Lower Body Dressing: Independently;sit to/from stand Pt Will Transfer to Toilet: Independently;ambulating;regular height toilet  Plan Discharge plan remains appropriate    Co-evaluation    PT/OT/SLP Co-Evaluation/Treatment: Yes Reason for Co-Treatment: For patient/therapist safety;To address functional/ADL transfers PT goals addressed during session: Mobility/safety with mobility;Balance;Proper use of DME;Strengthening/ROM OT goals  addressed during session: ADL's and self-care      AM-PAC OT "6 Clicks" Daily Activity     Outcome Measure   Help from another person eating meals?: None Help from another person taking care of personal grooming?: A Lot Help from another person toileting, which includes using toliet, bedpan, or urinal?: A Lot Help from another person bathing (including washing, rinsing, drying)?: A Lot Help from another person to put on and taking off regular upper body clothing?: A Lot Help from another person to put on and taking off regular lower body clothing?: A Lot 6 Click Score: 14    End of Session Equipment Utilized During Treatment: Gait belt;Rolling walker (2 wheels)  OT Visit Diagnosis: Unsteadiness on feet (R26.81);Other symptoms and signs involving cognitive function   Activity Tolerance Patient limited by lethargy   Patient Left in chair;with call bell/phone within reach;with family/visitor present   Nurse Communication Mobility status        Time: 1191-4782 OT Time Calculation (min): 28 min  Charges: OT General Charges $OT Visit: 1 Visit OT Treatments $Self Care/Home Management : 8-22 mins  Lodema Hong, [place]  Office [phone]   Trixie Dredge 07/06/2022, 1:25  PM

## 2022-07-06 NOTE — Progress Notes (Addendum)
  Progress Note  VASCULAR SURGERY ASSESSMENT & PLAN:   S/P TEMPORAL ARTERY BIOPSY: Incision is healing fine.  Her biopsy was negative for temporal arteritis.  Vascular surgery will be available as needed.  Gae Gallop, MD 6:19 PM   07/06/2022 7:58 AM 1 Day Post-Op  Subjective:  sleeping comfortably    Vitals:   07/06/22 0000 07/06/22 0333  BP: 136/63 (!) 122/55  Pulse: 70 66  Resp: (!) 22 20  Temp: 97.6 F (36.4 C) 98.1 F (36.7 C)  SpO2: 98% 96%    Physical Exam: Lungs:  nonlabored Incisions:  L temporal incision c/d/l Extremities:  palpable radial pulses bilaterally  CBC    Component Value Date/Time   WBC 7.2 07/06/2022 0409   RBC 2.87 (L) 07/06/2022 0409   HGB 9.3 (L) 07/06/2022 0409   HGB 10.2 (L) 04/20/2017 1151   HCT 28.6 (L) 07/06/2022 0409   HCT 29.6 (L) 04/20/2017 1151   PLT 259 07/06/2022 0409   PLT 385 (H) 04/20/2017 1151   MCV 99.7 07/06/2022 0409   MCV 89 04/20/2017 1151   MCH 32.4 07/06/2022 0409   MCHC 32.5 07/06/2022 0409   RDW 14.6 07/06/2022 0409   RDW 13.9 04/20/2017 1151   LYMPHSABS 1.4 07/06/2022 0409   LYMPHSABS 1.5 04/20/2017 1151   MONOABS 0.8 07/06/2022 0409   EOSABS 0.1 07/06/2022 0409   EOSABS 0.3 04/20/2017 1151   BASOSABS 0.0 07/06/2022 0409   BASOSABS 0.0 04/20/2017 1151    BMET    Component Value Date/Time   NA 137 07/06/2022 0409   NA 136 04/20/2017 1151   K 4.3 07/06/2022 0409   CL 111 07/06/2022 0409   CO2 19 (L) 07/06/2022 0409   GLUCOSE 125 (H) 07/06/2022 0409   BUN 21 07/06/2022 0409   BUN 24 04/20/2017 1151   CREATININE 0.73 07/06/2022 0409   CREATININE 0.75 03/05/2022 1139   CALCIUM 8.8 (L) 07/06/2022 0409   GFRNONAA >60 07/06/2022 0409   GFRNONAA 50 (L) 07/10/2020 1021   GFRAA 58 (L) 07/10/2020 1021    INR    Component Value Date/Time   INR 1.2 07/04/2022 1045     Intake/Output Summary (Last 24 hours) at 07/06/2022 0758 Last data filed at 07/05/2022 1700 Gross per 24 hour  Intake 1170 ml   Output 805 ml  Net 365 ml    Assessment/Plan:  81 y.o. female is 1 day post op, s/p: L temporal artery biopsy  -Incision is c/d/l without bruising or hematoma -No follow up needed  Vicente Serene, PA-C [facility] [phone] 07/06/2022 7:58 AM  I have interviewed the patient and examined the patient. I agree with the findings by the PA.   Gae Gallop, MD

## 2022-07-06 NOTE — TOC Initial Note (Signed)
Transition of Care [facility]) - Initial/Assessment Note    Patient Details  Name: Meghan Oliver MRN: [id number] Date of Birth: December 02, 1940  Transition of Care [facility]) CM/SW Contact:    Cyndi Bender, RN Phone Number: 07/06/2022, 11:38 AM  Clinical Narrative:                 Damaris Schooner to [name], Nurse,  and Mikeal Hawthorne, husband, regarding transition needs. [name] states patient lives in [place] at [place]. [name] is agreeable for patient to use the physical therapist that [facility] uses. Spoke to [place] at [employer] who state their PT is with [facility]. Patient does have transportation.  Address, Phone number and PCP verified.   TOC will continue to follow for needs.   Fax Home health orders to [phone] attention Georgina Peer ([facility])   Expected Discharge Plan: [place] Barriers to Discharge: Continued Medical Work up   Patient Goals and CMS Choice Patient states their goals for this hospitalization and ongoing recovery are:: return home CMS Medicare.gov Compare Post Acute Care list provided to:: Patient Represenative (must comment)    Expected Discharge Plan and Services Expected Discharge Plan: [place]   Discharge Planning Services: CM Consult Post Acute Care Choice: [place] arrangements for the past 2 months: [place])                                      Prior Living Arrangements/Services Living arrangements for the past 2 months: [place]) Lives with:: Spouse Patient language and need for interpreter reviewed:: Yes Do you feel safe going back to the place where you live?: Yes      Need for Family Participation in Patient Care: Yes (Comment) Care giver support system in place?: Yes (comment) Current home services: DME Criminal Activity/Legal Involvement Pertinent to Current Situation/Hospitalization: No - Comment as needed  Activities of Daily  Living Home Assistive Devices/Equipment: Cane (specify quad or straight) ADL Screening (condition at time of admission) Patient's cognitive ability adequate to safely complete daily activities?: No Is the patient deaf or have difficulty hearing?: No Does the patient have difficulty seeing, even when wearing glasses/contacts?: No Does the patient have difficulty concentrating, remembering, or making decisions?: Yes Patient able to express need for assistance with ADLs?: Yes Does the patient have difficulty dressing or bathing?: No Independently performs ADLs?: No Does the patient have difficulty walking or climbing stairs?: Yes Weakness of Legs: None Weakness of Arms/Hands: None  Permission Sought/Granted Permission sought to share information with : Case Manager       Permission granted to share info w AGENCY: HH        Emotional Assessment       Orientation: : Oriented to Self Alcohol / Substance Use: Not Applicable Psych Involvement: No (comment)  Admission diagnosis:  Elevated erythrocyte sedimentation rate [R70.0] Community acquired pneumonia [J18.9] Left temporal headache [R51.9] Community acquired pneumonia of right middle lobe of lung [J18.9] Patient Active Problem List   Diagnosis Date Noted   Community acquired pneumonia 07/01/2022   Memory loss 07/01/2022   Temporal headache 07/01/2022   Macrocytic anemia 07/01/2022   Elevated troponin 07/01/2022   Chronic constipation 07/01/2022   S/P total right hip arthroplasty 05/18/2022   Overactive bladder 02/19/2022   S/P placement of cardiac pacemaker 03/17/18 [facility]  03/18/2018   Sinus node dysfunction ([place]) 03/17/2018  Bradycardia 07/16/2017   Unstable angina (HCC)    Hypotension 04/20/2017   Coronary artery calcification seen on CAT scan 06/25/2012   Lung nodule 06/25/2012   Hypothyroidism 03/21/2012   bipolar/depression    Chronic cough 08/12/2011   OSA (obstructive sleep apnea) 04/05/2011   EDEMA  11/06/2010   Chest pain 11/06/2010   Hyperlipidemia 08/22/2007   Bipolar disorder ([place]) 08/22/2007   GERD 08/22/2007   LOW BACK PAIN 08/22/2007   OSTEOPOROSIS 08/22/2007   INSOMNIA 74/82/7078   SYSTOLIC MURMUR 67/54/4920   PCP:  [name], MD Pharmacy:   [facility] [address], [place] [address] Phone: [phone] Fax: [place] [address] Phone: [phone] Fax: [phone]  [facility] [place] [facility], [place] [address] [place] [address] Phone: [phone] Fax: [phone]  [facility] [place], [place] [address] Phone: [phone] Fax: [phone]     Social Determinants of Health (SDOH) Interventions    Readmission Risk Interventions     No data to display

## 2022-07-06 NOTE — Consult Note (Addendum)
[facility] Consult: 1:18 PM 07/06/2022  LOS: 4 days    Referring Provider: Dr Ronnie Derby  Primary Care Physician:  [name], MD Primary Gastroenterologist:  Dr. Silverio Decamp.       Reason for Consultation:  elevated transaminases.       HPI: Meghan Oliver is a 81 y.o. female.  PMH headaches.  Anxiety. Arthritis.  Bipolar II disorder.  S/p electroconvulsive therapy.  Chronic bronchitis. Chronic lower back/R hip pain.  CKD 2.  CHF.  CAD.  SSS.  2019 [place] pacemaker.  GERD, HH, erosive esophagitis. Adenomatous colon polyp at sigmoid 08/2016.  Distal sigmoid diverticulitis on CT 08/2017.  Constipation.  Stercoral ulcerations at 2018 colonoscopy.  HLD.  HTN.  Hypothyroidism. Internal carotid artery stenosis, bilateral,  chronic, Memory loss.  OSA. Osteoporosis. Pulmonary nodule S/p 05/18/22 R hip replacement for end-stage degeneration and unrelenting pain. 04/2022 echocardiogram.  LVEF 65 to 70%.  Indeterminate left ventricular diastolic parameters.  Mild elevated pulmonary artery pressures, normal right ventricular systolic function.  Moderate left atrial dilation.  Mild to moderate MV regurgitation but no stenosis.  Moderate calcification and thickening of tricuspid aortic valve w mild to moderate regurgitation and sclerosis/calcification but no stenosis.  Latest EGD 11/07/2020 for evaluation of reflux despite PPI, dysphagia.  Findings included benign esophageal stenosis at cricopharyngeus and she underwent dilatation.  Irregular Z-line at GE junction.  Minor changes of reflux esophagitis.  3 cm hiatal hernia.  GE flap valve Hill grade 4.  Antral erythema.  No biopsies obtained.  Prior biopsies did not confirm Barrett's esophagus Latest colonoscopy 09/2017.  [facility].   For eval of severe  constipation, sigmoid narrowing on CT scan.  Procedure terminated at ascending colon due to poor prep and fixed colon with ulcerations.  A 7 mm, sessile polyp was resected from the transverse colon.  Several large ulcers at the sigmoid were biopsied.  Small rectal ulcer noted.  Ulcers appeared consistent with stercoral ulcerations from her constipation. 07/2017 nuc med gastric emptying study with delayed gastric emptying. 07/2017 esophagram with mild esophageal dysmotility likely indicating presbyesophagus.  Small HH.  Delayed passage of barium tablet at distal esophagus likely related to dysmotility.  No strictures. Home medications include Dexilant 60 mg daily, Linzess 72 mcg bid, MiraLAX prn, po B12 daily.    Admitted last week w headache for 3 days.  Similar left temporal headache 1 to 2 months ago.  CXR confirmed pneumonia RUL.  Has received treatment with azithromycin, ceftriaxone, steroids.  However follow-up CXR 10/1 showed progression right lung opacities.  Requiring 2 to 4 L of nasal cannula oxygen intermittently.  ESR elevated and vascular surgery has performed temporal artery biopsy with no evidence for granuloma or arteritis..  Pulmonary consulted for possible vasculitic infiltrate.  ANA, ANCA, CCP, rheumatoid factor is pending.  Dr Vaughan Browner reviewed imaging and feels the CT is more consistent with pneumonia.  Since admission she has had progressive elevations of her transaminases.  Alkaline phosphatase, T. bili consistently normal.  07/04/22 abd ultrasound: CBD 5 mm.  Small stones and/or dense sludge in gallbladder.  No gallbladder wall thickening or changes consistent with cholecystitis.  Normal liver parenchyma.  Portal vein Dopplers normal.  Reviewing prior LFTs it was in 2018/2019 when she first displayed elevation of transaminases.  AST 50s to mid 60s until current admission when AST rising from 133 to 272.  ALT was 38 to 106 in 2018 through 03/2022,  currently rising from 170-245.  Has not  had hepatitis serologies, find no hepatitis viral testing in epic. INR normal 1.2. Immunization reviewed.  Vaccinated for hepatitis a 09/2012.  Did not find hepatitis B vaccination recorded. Other lab derangements include hyponatremia of 133 which has resolved. Anemia with Hgb nadir 8.2, 9.3 today.  MCV as high as 103. Review of several years labs shows anemia with hgb as low as 9 in 2018, 9.7 five weeks PTA.  Stool hemoccults not obtained.   ANA negative.  GI ROS positive for chronic anorexia.  Takes mirtazapine to help stimulate her appetite.  Because of current shortness of breath and cough, and increased upper extremity tremor, it has become harder for her to eat.  She has not had choking, aspiration or dysphagia but again with her respiratory issues it is a little harder for her to swallow than her norm. No nausea or vomiting. She has had a significant cough with spells of coughing lasting up to 45 minutes, mostly nonproductive.  This is caused pain in her chest, abdomen, throat.   Constipation is an ever present concern.  It was really bad after her hip surgery in August when she went 16 days without a bowel movement.  Treatments included Movantik, enemas, suppositories in addition to her usual Linzess and Peri-Colace but improved.  As of arrival to the hospital last week she was having stable bowel movements on Linzess and Peri-Colace.  However during the hospitalization its been 6 days without a bowel movement.  She and her caregiver avoid MiraLAX to prevent fecal accidents/incontinence. Weight at baseline is around 94 # and currently 89 #..  BMI is 18.8   She has had decreased level of consciousness with significant sundowning.  At home prior to admission she was independent in all of her ADLs but does suffer from dementia treated with Namenda.  Patient lives at [place] with her husband, retired [facility] physician Dr. Lyla Son.  No alcohol.  No cigarettes/tobacco. Family history  significant for COPD in her mother.  MI, heart disease, hypertension in her father who died at 25.  Paternal aunt and paternal grandmother had breast cancer.  Past Medical History:  Diagnosis Date   Anxiety    Arthritis    "hips, spine" (03/17/2018)   Bipolar II disorder (HCC)    CHF (congestive heart failure) (HCC)    Chronic bronchitis (HCC)    Chronic lower back pain    Chronic right hip pain    CKD (chronic kidney disease), stage II    Coronary artery disease    stent x1   Esophagitis, erosive    GAD (generalized anxiety disorder)    GERD (gastroesophageal reflux disease)    Headache    "maybe monthly" (03/17/2018))   Heart murmur, systolic    History of adenomatous polyp of colon    08-04-2016  tubular adenoma   History of blood transfusion 12/2017   "related to vascular hematoma"   History of electroconvulsive therapy    at [facility]--  started 04-15-2015 to 11-17-2016  total greater than 40 times   History of  hiatal hernia    Hyperlipidemia    Hypertension    Hypothyroidism    Internal carotid artery stenosis, bilateral    per last duplex 05-01-2014  bilateral ICA 40-59%   Major depression, chronic    ECT treatments extensive and multiple started 07/ 2016   Memory loss    "both short and long-term; needs frequent reminders to follow instrucitons" (05/16/2017)   Migraines    "none in years" (03/17/2018)   OSA (obstructive sleep apnea)    per study 06/ 2012 moderate OSA  ; "refuses to wear masks" (03/17/2018)   Osteoporosis    Poor historian    due to short term memory loss   Presence of permanent cardiac pacemaker 03/17/2018   Pulmonary nodule    monitored by pcp   S/P placement of cardiac pacemaker 03/17/18 [facility]  03/18/2018   Short-term memory loss    Sick sinus syndrome [facility])     Past Surgical History:  Procedure Laterality Date   APPENDECTOMY  1971   ARTERY BIOPSY Left 07/05/2022   Procedure: BIOPSY TEMPORAL ARTERY;  Surgeon: Broadus John, MD;  Location: [facility]  OR;  Service: Vascular;  Laterality: Left;   AUGMENTATION MAMMAPLASTY Bilateral    CARDIOVASCULAR STRESS TEST  01-30-2015  dr Aundra Dubin   Low risk nuclear study w/ no evidence ischemia or infarction/  normal LV funciton and wall motion , 76%   COLONOSCOPY W/ BIOPSIES AND POLYPECTOMY  "multiple"   COLONOSCOPY WITH ESOPHAGOGASTRODUODENOSCOPY (EGD)  last one 08-04-2016   CORONARY ANGIOPLASTY WITH STENT PLACEMENT  05/16/2017   "LAD"   CORONARY STENT INTERVENTION N/A 05/16/2017   Procedure: CORONARY STENT INTERVENTION;  Surgeon: Burnell Blanks, MD;  Location: [place] [facility];  Service: Cardiovascular;  Laterality: N/A;   ESOPHAGOGASTRODUODENOSCOPY  02-26-04   ESOPHAGOGASTRODUODENOSCOPY  "multiple"   INSERT / REPLACE / REMOVE PACEMAKER  03/17/2018   LEFT HEART CATH AND CORONARY ANGIOGRAPHY N/A 05/16/2017   Procedure: LEFT HEART CATH AND CORONARY ANGIOGRAPHY;  Surgeon: Larey Dresser, MD;  Location: [facility];  Service: Cardiovascular;  Laterality: N/A;   OVARIAN CYST SURGERY  1970s   Laparotomy    PACEMAKER IMPLANT N/A 03/17/2018   Procedure: PACEMAKER IMPLANT;  Surgeon: Evans Lance, MD;  Location: [place] [facility];  Service: Cardiovascular;  Laterality: N/A;   PORT-A-CATH PLACEMENT  05/31/2016; 2018   "@ [facility]; for ECT series"; "@ [facility] also"   PORT-A-CATH REMOVAL N/A 03/11/2017   Procedure: REMOVAL PORT-A-CATH;  Surgeon: Jackolyn Confer, MD;  Location: [facility];  Service: General;  Laterality: N/A;   PORTA CATH REMOVAL  03/17/2018   PORTA CATH REMOVAL  03/17/2018   Procedure: PORTA CATH REMOVAL;  Surgeon: Evans Lance, MD;  Location: [facility];  Service: Cardiovascular;;   TOTAL HIP ARTHROPLASTY Right 05/18/2022   Procedure: TOTAL HIP ARTHROPLASTY ANTERIOR APPROACH;  Surgeon: Paralee Cancel, MD;  Location: [facility] ORS;  Service: Orthopedics;  Laterality: Right;   TRANSTHORACIC ECHOCARDIOGRAM  09/05/2013  dr Aundra Dubin   mild LVH, ef 65-70%, grade 1  diastolic dysfunction/  very mild AV stenosis with mild AR/  trivial MR and PT/ mild to moderate LAE/ mild TR/ mild pulmonary hypertension with PA peak pressure 69mHg   TUBAL LIGATION      Prior to Admission medications   Medication Sig Start Date End Date Taking? Authorizing Provider  acetaminophen (TYLENOL) 500 MG tablet Take 2 tablets (1,000 mg total) by mouth every 6 (six) hours. 05/19/22  Yes Irving Copas, PA-C  Calcium Carbonate-Vitamin D (CALCIUM 600/VITAMIN D PO) Take 1 tablet by mouth 2 (two) times daily.   Yes [provider]  cetirizine (ZYRTEC) 10 MG tablet Take 10 mg by mouth daily as needed for allergies.   Yes [provider]  Cholecalciferol (VITAMIN D3) 50 MCG (2000 UT) TABS Take 2,000 Units by mouth in the morning.   Yes [provider]  dexlansoprazole (DEXILANT) 60 MG capsule Take 1 capsule by mouth daily Patient taking differently: Take 60 mg by mouth daily. 10/22/21  Yes Nandigam, Venia Minks, MD  fesoterodine (TOVIAZ) 8 MG TB24 tablet Take 1 tablet (8 mg total) by mouth daily. 02/18/22  Yes Baxley, Cresenciano Lick, MD  Krill Oil 500 MG CAPS Take 500 mg by mouth daily.   Yes [provider]  Lavender Oil 80 MG CAPS Take 160 mg by mouth at bedtime.   Yes [provider]  levothyroxine (SYNTHROID) 75 MCG tablet Take 1 tablet (75 mcg total) by mouth daily. 05/12/22  Yes Baxley, Cresenciano Lick, MD  linaclotide Rolan Lipa) 72 MCG capsule Take 1 capsule (72 mcg total) by mouth in the morning and at bedtime. 06/16/22  Yes Nandigam, Venia Minks, MD  lithium carbonate 300 MG capsule Take 1 capsule (300 mg total) by mouth at bedtime. 05/12/22  Yes Cottle, Billey Co., MD  LORazepam (ATIVAN) 0.5 MG tablet TAKE 1/2 TO 1 TABLET BY MOUTH EVERY NIGHT AT BEDTIME AND 1 TABLET EVERY 8 HOURS AS NEEDED FOR ANXIETY Patient taking differently: Take 0.25-0.5 mg by mouth See admin instructions. TAKE 1/2 TO 1 TABLET BY MOUTH EVERY NIGHT AT BEDTIME AND 1 TABLET EVERY 8 HOURS AS  NEEDED FOR ANXIETY 03/10/22  Yes Cottle, Billey Co., MD  memantine (NAMENDA) 10 MG tablet Take 1 tablet (10 mg total) by mouth 2 (two) times daily. 03/10/22  Yes Cottle, Billey Co., MD  methocarbamol (ROBAXIN) 500 MG tablet Take 1 tablet (500 mg total) by mouth every 6 (six) hours as needed for muscle spasms. 05/19/22  Yes Irving Copas, PA-C  mirtazapine (REMERON) 7.5 MG tablet Take 1 tablet (7.5 mg total) by mouth at bedtime. 03/10/22  Yes Cottle, Billey Co., MD  Multiple Vitamins-Minerals (MULTIVITAMIN WITH MINERALS) tablet Take 1 tablet by mouth daily.   Yes [provider]  Multiple Vitamins-Minerals (PRESERVISION AREDS 2 PO) Take 1 capsule by mouth in the morning and at bedtime.   Yes [provider]  naloxegol oxalate (MOVANTIK) 12.5 MG TABS tablet Take 2 tablets (25 mg total) by mouth daily. Start with 1 tablet daily and if tolerating well for a week increase to 2 tablets daily 05/24/22  Yes Nandigam, Venia Minks, MD  nortriptyline (PAMELOR) 75 MG capsule Take 1 capsule (75 mg total) by mouth at bedtime. 03/10/22  Yes Cottle, Billey Co., MD  ondansetron (ZOFRAN-ODT) 4 MG disintegrating tablet Place 1 tablet every 6 hours by translingual route as needed. 05/20/22  Yes   PARoxetine (PAXIL) 20 MG tablet Take 1 tablet (20 mg total) by mouth at bedtime. 05/10/22  Yes Cottle, Billey Co., MD  polyethylene glycol (MIRALAX / GLYCOLAX) 17 g packet Take 17 g by mouth daily as needed for mild constipation.   Yes [provider]  primidone (MYSOLINE) 50 MG tablet Take 2 tablets by mouth every morning and take 1 tablet by mouth every evening 05/14/22  Yes Cottle, Billey Co., MD  Pyridoxine HCl (VITAMIN B-6) 500 MG tablet Take  500 mg by mouth 2 (two) times daily.   Yes [provider]  rosuvastatin (CRESTOR) 40 MG tablet TAKE 1 TABLET (40 MG) BY MOUTH DAILY Patient taking differently: Take 40 mg by mouth daily. 01/12/22  Yes Baxley, Cresenciano Lick, MD  sennosides-docusate sodium  (SENOKOT-S) 8.6-50 MG tablet Take 2 tablets by mouth at bedtime.   Yes [provider]  traMADol (ULTRAM) 50 MG tablet Take 1-2 tablets (50-100 mg total) by mouth every 6 (six) hours as needed for moderate pain or severe pain. 05/19/22  Yes Irving Copas, PA-C  valACYclovir (VALTREX) 1000 MG tablet Take 2 tablets by mouth at onset, then 2 tablets 12 hours later Patient taking differently: Take 2,000 mg by mouth as directed. 07/15/21  Yes   vitamin B-12 (CYANOCOBALAMIN) 1000 MCG tablet Take 1,000 mcg by mouth at bedtime.   Yes [provider]  primidone (MYSOLINE) 50 MG tablet TAKE 2 TABLETS BY MOUTH EVERY MORNING AND 1 TABLET EVERY EVENING Patient not taking: Reported on 07/01/2022 05/12/22   Cottle, Billey Co., MD    Scheduled Meds:  aspirin  81 mg Oral Daily   benzonatate  100 mg Oral TID   cyanocobalamin  1,000 mcg Oral QHS   enoxaparin (LOVENOX) injection  30 mg Subcutaneous Q24H   fesoterodine  8 mg Oral Daily   fluticasone  2 spray Each Nare Daily   gabapentin  300 mg Oral BID   levothyroxine  75 mcg Oral Q0600   linaclotide  72 mcg Oral BID   lithium carbonate  300 mg Oral QHS   loratadine  10 mg Oral Daily   memantine  10 mg Oral BID   mirtazapine  7.5 mg Oral QHS   nortriptyline  75 mg Oral QHS   pantoprazole  40 mg Oral BID AC   PARoxetine  20 mg Oral QHS   predniSONE  60 mg Oral Q breakfast   primidone  100 mg Oral q AM   primidone  50 mg Oral QHS   vitamin B-6  500 mg Oral BID   rosuvastatin  20 mg Oral Daily   senna-docusate  2 tablet Oral BID   Infusions:  azithromycin (ZITHROMAX) 500 mg in sodium chloride 0.9 % 250 mL IVPB 500 mg (07/06/22 0935)   cefTRIAXone (ROCEPHIN)  IV Stopped (07/06/22 0933)   PRN Meds: acetaminophen **OR** acetaminophen, albuterol, guaiFENesin-codeine, guaiFENesin-dextromethorphan, LORazepam, LORazepam, naloxegol oxalate, ondansetron (ZOFRAN) IV, polyethylene glycol, traMADol   Allergies as of 07/01/2022 - Review  Complete 07/01/2022  Allergen Reaction Noted   Propranolol Other (See Comments) 08/06/2013   Brexpiprazole Other (See Comments) 11/27/2015    Family History  Problem Relation Age of Onset   Heart attack Father 68       deceased   Hypertension Father    Heart disease Father    Breast cancer Paternal 21        Age unknown   Breast cancer Paternal Aunt        Age 94's   Colon cancer Neg Hx     Social History   Socioeconomic History   Marital status: Married    Spouse name: Dr. Lyla Son   Number of children: 2   Years of education: Not on file   Highest education level: Not on file  Occupational History   Occupation: housewife    Employer: UNEMPLOYED  Tobacco Use   Smoking status: Former    Packs/day: 2.00    Years: 15.00    Total pack  years: 30.00    Types: Cigarettes    Quit date: 01/13/1971    Years since quitting: 51.5   Smokeless tobacco: Never  Vaping Use   Vaping Use: Never used  Substance and Sexual Activity   Alcohol use: Yes    Comment: occassional 1 x a week   Drug use: Never   Sexual activity: Not Currently    Comment: intercourse age 6, sexual partners less than 5  Other Topics Concern   Not on file  Social History Narrative   Not on file   Social Determinants of Health   Financial Resource Strain: Not on file  Food Insecurity: No Food Insecurity (07/06/2022)   Hunger Vital Sign    Worried About Running Out of Food in the Last Year: Never true    Ran Out of Food in the Last Year: Never true  Transportation Needs: No Transportation Needs (07/06/2022)   PRAPARE - Hydrologist (Medical): No    Lack of Transportation (Non-Medical): No  Physical Activity: Not on file  Stress: Not on file  Social Connections: Not on file  Intimate Partner Violence: Not At Risk (07/06/2022)   Humiliation, Afraid, Rape, and Kick questionnaire    Fear of Current or Ex-Partner: No    Emotionally Abused: No    Physically Abused:  No    Sexually Abused: No    REVIEW OF SYSTEMS: Constitutional: Generally weak.  No profound fatigue. ENT:  No nose bleeds Pulm: Active paroxysmal cough.  Sometimes this is loose but she is not clearing a lot of sputum when she coughs. CV:  No palpitations, no LE edema.  Chest pain is not anginal and more musculoskeletal related to the cough. GU:  No hematuria, no frequency GI: See HPI. Heme: No unusual bleeding or bruising. Transfusions:  None Neuro: Headache as per HPI.  These have improved.  Chronic tremor. Derm:  No itching, no rash or sores.  Endocrine:  No sweats or chills.  No polyuria or dysuria Immunization: Reviewed. Travel: Not queried but has not traveled outside of [place] since her hip surgery in August.   PHYSICAL EXAM: Vital signs in last 24 hours: Vitals:   07/06/22 1100 07/06/22 1241  BP: (!) 119/52 (!) 122/54  Pulse:  62  Resp:  18  Temp:  97.8 F (36.6 C)  SpO2:  96%   Wt Readings from Last 3 Encounters:  07/05/22 40.8 kg  05/18/22 42.6 kg  03/31/22 44.7 kg    General: Frail, thin, somewhat ill-appearing but nontoxic.  Alert, comfortable Head: No facial asymmetry or swelling.  No signs of head trauma. Eyes: No conjunctival pallor.  No scleral icterus. Ears: No hearing deficit Nose: No congestion or discharge. Mouth: Oral Koza is moist, pink, clear.  Pharynx clear.  Tongue midline.  Good dentition. Neck: No JVD, no masses, no thyromegaly. Lungs: Diminished breath sounds more diminished on the right.  Some dry rales in the bases.  Cough elicited by deep inspiration during lung auscultation. Heart: RRR with soft murmur.  No MRG.  S1, S2 present. Abdomen: Soft, nontender.  No HSM, masses, bruits, hernias..   Rectal: Did not perform rectal exam.  Patient was in bedside lounge chair during the encounter/exam. Musc/Skeltl: No joint redness, swelling.  Some mild arthritic changes in her fingers. Extremities: No CCE. Neurologic: Oriented to self.  Not  oriented to place or time.  Oriented to her husband, [name] her Systems developer.  Acknowledges my presence.   Skin: No  rash, no sores, no significant bruising or lesions Nodes: No cervical adenopathy Psych: Calm, cooperative.  Speech clear but halting.  Intake/Output from previous day: 10/02 0701 - 10/03 0700 In: 1170 [I.V.:800; IV Piggyback:370] Out: 805 [Urine:800; Blood:5] Intake/Output this shift: Total I/O In: -  Out: 700 [Urine:700]  LAB RESULTS: Recent Labs    07/04/22 0625 07/05/22 0100 07/06/22 0409  WBC 6.6 6.6 7.2  HGB 8.8* 8.2* 9.3*  HCT 26.7* 25.0* 28.6*  PLT 172 209 259   BMET Lab Results  Component Value Date   NA 137 07/06/2022   NA 134 (L) 07/05/2022   NA 133 (L) 07/04/2022   K 4.3 07/06/2022   K 3.7 07/05/2022   K 3.9 07/04/2022   CL 111 07/06/2022   CL 110 07/05/2022   CL 111 07/04/2022   CO2 19 (L) 07/06/2022   CO2 18 (L) 07/05/2022   CO2 18 (L) 07/04/2022   GLUCOSE 125 (H) 07/06/2022   GLUCOSE 120 (H) 07/05/2022   GLUCOSE 88 07/04/2022   BUN 21 07/06/2022   BUN 19 07/05/2022   BUN 14 07/04/2022   CREATININE 0.73 07/06/2022   CREATININE 0.64 07/05/2022   CREATININE 0.70 07/04/2022   CALCIUM 8.8 (L) 07/06/2022   CALCIUM 8.5 (L) 07/05/2022   CALCIUM 8.0 (L) 07/04/2022   LFT Recent Labs    07/04/22 0625 07/05/22 0100 07/06/22 0409  PROT 5.5* 5.6* 5.8*  ALBUMIN 2.2* 2.2* 2.2*  AST 200* 166* 272*  ALT 214* 199* 245*  ALKPHOS 72 74 78  BILITOT 0.4 0.4 0.1*   PT/INR Lab Results  Component Value Date   INR 1.2 07/04/2022   INR 1.11 05/16/2017   Hepatitis Panel No results for input(s): "HEPBSAG", "HCVAB", "HEPAIGM", "HEPBIGM" in the last 72 hours. C-Diff No components found for: "CDIFF" Lipase  No results found for: "LIPASE"  Drugs of Abuse     Component Value Date/Time   LABOPIA NONE DETECTED 07/13/2017 1610   COCAINSCRNUR NONE DETECTED 07/13/2017 1610   LABBENZ POSITIVE (A) 07/13/2017 1610   AMPHETMU NONE DETECTED  07/13/2017 1610   THCU NONE DETECTED 07/13/2017 1610   LABBARB POSITIVE (A) 07/13/2017 1610     RADIOLOGY STUDIES: CT Chest High Resolution  Result Date: 07/06/2022 CLINICAL DATA:  81 year old female with history of complicated pneumonia. Evaluate for interstitial lung disease. EXAM: CT CHEST WITHOUT CONTRAST TECHNIQUE: Multidetector CT imaging of the chest was performed following the standard protocol without intravenous contrast. High resolution imaging of the lungs, as well as inspiratory and expiratory imaging, was performed. RADIATION DOSE REDUCTION: This exam was performed according to the departmental dose-optimization program which includes automated exposure control, adjustment of the mA and/or kV according to patient size and/or use of iterative reconstruction technique. COMPARISON:  Cardiac CT 05/06/2017.  Chest CT 09/05/2013. FINDINGS: Cardiovascular: Heart size is borderline enlarged. There is no significant pericardial fluid, thickening or pericardial calcification. There is aortic atherosclerosis, as well as atherosclerosis of the great vessels of the mediastinum and the coronary arteries, including calcified atherosclerotic plaque in the left main, left anterior descending, left circumflex and right coronary arteries. Calcifications of the aortic valve. Left-sided pacemaker/AICD with lead tips terminating in the right atrium and right ventricle. Mediastinum/Nodes: No pathologically enlarged mediastinal or hilar lymph nodes. Small hiatal hernia. No axillary lymphadenopathy. Lungs/Pleura: Moderate right and small left pleural effusions lying dependently. There are patchy randomly distributed areas of airspace consolidation, ground-glass attenuation, inter and intra lobular septal thickening and some mild architectural distortion scattered throughout the lungs (  right greater than left), most severe in the right upper and right lower lobes, likely to reflect severe multilobar bilateral  bronchopneumonia. No definite suspicious appearing pulmonary nodules or masses are noted. Inspiratory and expiratory imaging is unremarkable. Upper Abdomen: Aortic atherosclerosis. Incompletely imaged low-attenuation lesion in the left kidney measuring at least 2.9 cm in diameter, incompletely characterized on today's noncontrast CT examination, but statistically likely a cyst (no imaging follow-up is recommended). Musculoskeletal: There are no aggressive appearing lytic or blastic lesions noted in the visualized portions of the skeleton. Bilateral breast implants are incidentally noted. IMPRESSION: 1. The appearance of the chest is most compatible with acute multilobar bilateral bronchopneumonia (right greater than left) with moderate right and small left parapneumonic pleural effusions. If there is clinical concern for interstitial lung disease after resolution of the patient's acute illness, repeat high-resolution chest CT could be obtained in 6-12 months to assess for temporal changes in the appearance of the lung parenchyma. 2. Aortic atherosclerosis, in addition to left main and three-vessel coronary artery disease. 3. There are calcifications of the aortic valve. Echocardiographic correlation for evaluation of potential valvular dysfunction may be warranted if clinically indicated. Aortic Atherosclerosis (ICD10-I70.0). Electronically Signed   By: Vinnie Langton M.D.   On: 07/06/2022 06:42      IMPRESSION:   Transaminitis.  Present dating back to 2017/2018 but more marked elevation during this past week's admission.  Ultrasound of abdomen demonstrates sludge and/or small stones in gallbladder but not cholecystitis.  Liver parenchyma and portal vein Dopplers normal.  She is on multiple medications which could be culprit.    Acute, multi lobar bronchopneumonia right greater than left.  Moderate bilateral parapneumonic pleural effusions.  Elevated ESR, elevated CRP with rheumatology lab work-up in  progress including ANA (which is negative), ANCA, RF, CCP, dsDNA.  Steroid Rx day 2.  Pulmonary following and will follow up CT to see if target for thoracentesis by IR.    Headache.  Temporal artery biopsy negative for giant cell.  Yield for these biopsies are about 60% and 40% false negatives.  Patient on 60 mg of prednisone and will remain so until she sees Dr Estanislado Pandy, endocrinologist  Macrocytic anemia.  Iron studies inconclusive (low iron, low sats, normal TIBC and elevated ferritin), B12 stable.  Chronic constipation.  Stercoral ulcers at latest attempted, incomplete colonoscopy 2018.  Current meds include Linzess, Senokot; Movantik and Miralax prn    PLAN:       Check acute hepatitis panel.  Will d/w Dr Lorenso Courier as to other labs to obtain that could include AMA, smooth muscle Ab, ceruloplasmin, Alpha 1 AT, IgG.  Ferritin is normal.      ? MRI/MRCP?  Her pacemaker is not a contraindication, she just had MRI of her head less than a week ago.  Her husband would agree with any advanced imaging but would like to avoid liver biopsy.   Azucena Freed  07/06/2022, 1:18 PM Phone [phone]

## 2022-07-06 NOTE — Progress Notes (Addendum)
PROGRESS NOTE                                                                                                                                                                                                             Patient Demographics:    Meghan Oliver, is a 81 y.o. female, DOB - 06-10-1941, TAE:[id number]  Outpatient Primary MD for the patient is Baxley, Cresenciano Lick, MD    LOS - 4  Admit date - 07/01/2022    Chief Complaint  Patient presents with   Headache       Brief Narrative (HPI from H&P)   81 y.o. female with medical history significant of HTN, HLD, hypothyroidism, OSA, GERD, sinus node dysfunction s/p PPM in 03/2018, bipolar and depression who presented to ED with 3 day history of cough, chills and a severe headache. New intermittent episodes of headache on left Meghan x 3 days prior to hospitalization of note she had similar headaches about 2 months ago which lasted for a few days.  No photophobia, no jaw claudication, no problems chewing food.  No temporal tenderness.  In the hospital she was diagnosed with pneumonia and admitted to the hospital.  Neurology was consulted for headaches.   Subjective:   Patient in bed denies any fever, no headache this morning, no chest pain but does have intermittent cough, no shortness of breath or abdominal pain, No vision changes.   Assessment  & Plan :    Community acquired pneumonia - she has been started on appropriate antibiotics, cynically improving except for persistent intermittent cough, getting supportive care for cough, has been cleared by speech therapy, continue supportive care with oxygen, I-S and flutter valve for pulmonary toiletry.  High-resolution CT scan also confirms pneumonia along with a small pleural effusion, she has been seen seen by pulmonary as well.  Her ESR was quite elevated which raised possibility of vasculitis however ESR could have been elevated due to  pneumonia as well.  Since ESR remains +++ high, have added Rheum workup as well, ANA, ANCA, RF, CCP, DS DNA.  Addendum temporal artery biopsy has come back negative for giant cell however the yield of temporal artery biopsy is about 60%.  40% can be falsely negative, I discussed the plan with patient's husband and we will continue 60 mg of prednisone upon discharge till she  sees endocrinologist Dr. Estanislado Pandy.   Intermittent episodes of left temporal headache - this has been ongoing on a intermittent basis for a few days, 1 such episode happened a few months ago but resolved by itself, she had MRI brain, CTA head, vascular ultrasound of the temporal artery which were all unremarkable.Vascular surgery consulted for temporal artery biopsy was done on 07/05/22.  After discussions with pulmonary and neurology therapeutic trial of steroids was started on 07/05/2022.  Wait biopsy results.    Asymptomatic transaminitis.  Have been elevated for a few years but significantly higher now, she is on Crestor, asymptomatic with stable right upper quadrant ultrasound, will request GI to evaluate on family request.  Elevated troponin - She has no chest pain or ekg changes, troponin trend flat and not an ACS pattern, continue aspirin and statin for secondary prevention Case discussed with her cardiologist Dr. Loralie Champagne, no further work-up for now.  Macrocytic anemia - Hb nadir of 9.7 after hip surgery in August, iron studies inconclusive, B12 stable, continue to monitor no signs of infection.  Some drop due to hemodilution.  Continue PPI twice daily.  Some fall due to hemodilution from IV fluids.  Continue to monitor.  Bipolar/depression - History of ECT x 30 in past, Continue home medication of paxil, pamelor, remeron and ativan PRN and lithium, stable lithium level.  Sinus node dysfunction (HCC) - S/p St. Jude PPM in 03/2018.   Memory loss - Continue Namenda, at risk for delirium and aspiration.  Discussed  with her primary nurse and husband on 07/02/2022.  Hyperlipidemia - Continue Crestor.   Hypothyroidism - TSH wnl in 03/2022, Continue home synthroid.   Hyponatremia - likely SIADH, hold IVF, much improved, monitor.  Chronic constipation - Continue home senokot, linzess, miralaz prn, also has movik prn, giving a dose on 07/04/2022.   GERD - Continue PPI.   OSA (obstructive sleep apnea) - Not on cpap, non compliant    Hypomagnesemia.  Replaced.       Condition - Extremely Guarded  Family Communication  :    Updated personal nurse at bedside in detail on 07/02/2022, 07/03/22.  Updated spouse Oneika Simonian [phone] on 07/02/2022, 07/03/22 message left at 9:43 AM, 07/04/22, 07/05/22, 07/06/2022  Code Status :  Full  Consults  :  Neuro, vascular surgery, GI, pulmonary  PUD Prophylaxis : PPI   Procedures  :     Left temporal artery biopsy done by vascular surgeon Dr. Quentin Cornwall on 07/05/2022.    MRI - 1. No acute intracranial abnormality. 2. Mild chronic small vessel ischemic disease  [name] Temp Artery - Stable  CT scan head, CT angiogram head and neck and CT venogram nonacute  RUQ [name] - 1. No acute findings. 2. Dense material layers in the gallbladder consistent with dense sludge or small stones. 3. Normal appearance of the liver.  No bile duct dilation.       Disposition Plan  :    Status is: Observation  DVT Prophylaxis  :    enoxaparin (LOVENOX) injection 30 mg Start: 07/01/22 1915   Lab Results  Component Value Date   PLT 259 07/06/2022    Diet :  Diet Order             DIET SOFT Room service appropriate? No; Fluid consistency: Thin  Diet effective now                    Inpatient Medications  Scheduled Meds:  aspirin  81  mg Oral Daily   benzonatate  100 mg Oral TID   cyanocobalamin  1,000 mcg Oral QHS   enoxaparin (LOVENOX) injection  30 mg Subcutaneous Q24H   fesoterodine  8 mg Oral Daily   fluticasone  2 spray Each Nare Daily   gabapentin  300  mg Oral BID   levothyroxine  75 mcg Oral Q0600   linaclotide  72 mcg Oral BID   lithium carbonate  300 mg Oral QHS   loratadine  10 mg Oral Daily   memantine  10 mg Oral BID   mirtazapine  7.5 mg Oral QHS   nortriptyline  75 mg Oral QHS   pantoprazole  40 mg Oral BID AC   PARoxetine  20 mg Oral QHS   predniSONE  60 mg Oral Q breakfast   primidone  100 mg Oral q AM   primidone  50 mg Oral QHS   vitamin B-6  500 mg Oral BID   rosuvastatin  20 mg Oral Daily   senna-docusate  2 tablet Oral BID   Continuous Infusions:  azithromycin (ZITHROMAX) 500 mg in sodium chloride 0.9 % 250 mL IVPB 500 mg (07/06/22 0935)   cefTRIAXone (ROCEPHIN)  IV Stopped (07/06/22 0933)   PRN Meds:.acetaminophen **OR** acetaminophen, albuterol, guaiFENesin-codeine, guaiFENesin-dextromethorphan, LORazepam, LORazepam, naloxegol oxalate, ondansetron (ZOFRAN) IV, polyethylene glycol, traMADol  Time Spent in minutes  30   Lala Lund M.D on 07/06/2022 at 10:47 AM  To page go to [email or web address]   [facility] -  Office  [phone]  See all Orders from today for further details    Objective:   Vitals:   07/05/22 2000 07/06/22 0000 07/06/22 0333 07/06/22 0800  BP: (!) 115/55 136/63 (!) 122/55 (!) 121/52  Pulse: 65 70 66 63  Resp: (!) 24 (!) 22 20 15   Temp: (!) 97.5 F (36.4 C) 97.6 F (36.4 C) 98.1 F (36.7 C)   TempSrc: Oral Oral Oral   SpO2: 100% 98% 96% 94%  Weight:      Height:        Wt Readings from Last 3 Encounters:  07/05/22 40.8 kg  05/18/22 42.6 kg  03/31/22 44.7 kg     Intake/Output Summary (Last 24 hours) at 07/06/2022 1047 Last data filed at 07/05/2022 1700 Gross per 24 hour  Intake 350 ml  Output 800 ml  Net -450 ml     Physical Exam  Awake Alert, No new F.N deficits, Normal affect .AT,PERRAL, left Meghan biopsy site stable Supple Neck, No JVD,   Symmetrical Chest wall movement, Good air movement bilaterally, CTAB RRR,No Gallops, Rubs or new Murmurs,  +ve  B.Sounds, Abd Soft, No tenderness,   No Cyanosis, Clubbing or edema     Data Review:    CBC Recent Labs  Lab 07/01/22 1140 07/02/22 0737 07/03/22 0652 07/04/22 0625 07/05/22 0100 07/06/22 0409  WBC 10.8* 8.9 7.5 6.6 6.6 7.2  HGB 10.1* 9.1* 8.9* 8.8* 8.2* 9.3*  HCT 32.3* 29.0* 26.9* 26.7* 25.0* 28.6*  PLT 179 174 183 172 209 259  MCV 103.2* 103.6* 100.0 100.8* 98.8 99.7  MCH 32.3 32.5 33.1 33.2 32.4 32.4  MCHC 31.3 31.4 33.1 33.0 32.8 32.5  RDW 13.4 13.4 13.7 14.2 14.2 14.6  LYMPHSABS 0.8  --  1.0 0.9 1.0 1.4  MONOABS 0.6  --  1.0 1.0 0.9 0.8  EOSABS 0.1  --  0.1 0.2 0.2 0.1  BASOSABS 0.0  --  0.0 0.0 0.0 0.0    Electrolytes Recent Labs  Lab  07/01/22 1618 07/01/22 1818 07/01/22 2052 07/02/22 0737 07/03/22 0652 07/04/22 0625 07/04/22 1045 07/05/22 0100 07/06/22 0409  NA  --   --   --  135 135 133*  --  134* 137  K  --   --   --  3.9 4.0 3.9  --  3.7 4.3  CL  --   --   --  107 109 111  --  110 111  CO2  --   --   --  24 19* 18*  --  18* 19*  GLUCOSE  --   --   --  102* 101* 88  --  120* 125*  BUN  --   --   --  20 11 14   --  19 21  CREATININE  --   --   --  0.74 0.70 0.70  --  0.64 0.73  CALCIUM  --   --   --  8.0* 8.1* 8.0*  --  8.5* 8.8*  AST  --   --   --   --  133* 200*  --  166* 272*  ALT  --   --   --   --  170* 214*  --  199* 245*  ALKPHOS  --   --   --   --  62 72  --  74 78  BILITOT  --   --   --   --  0.4 0.4  --  0.4 0.1*  ALBUMIN  --   --   --   --  2.4* 2.2*  --  2.2* 2.2*  MG  --   --   --  1.6* 2.5* 2.1  --  1.9 2.0  CRP 18.3* 18.1*  --   --  16.7*  --   --   --   --   PROCALCITON  --   --  2.05 1.51 0.94 0.59  --   --   --   INR  --   --   --   --   --   --  1.2  --   --   BNP  --   --  206.2*  --  510.5* 443.2*  --  311.3* 287.2*    ------------------------------------------------------------------------------------------------------------------ No results for input(s): "CHOL", "HDL", "LDLCALC", "TRIG", "CHOLHDL", "LDLDIRECT" in the last 72  hours.  Lab Results  Component Value Date   HGBA1C 5.1 02/15/2011    No results for input(s): "TSH", "T4TOTAL", "T3FREE", "THYROIDAB" in the last 72 hours.  Invalid input(s): "FREET3" ------------------------------------------------------------------------------------------------------------------ ID Labs Recent Labs  Lab 07/01/22 1618 07/01/22 1818 07/01/22 2052 07/02/22 0737 07/03/22 0652 07/04/22 0625 07/05/22 0100 07/06/22 0409  WBC  --   --   --  8.9 7.5 6.6 6.6 7.2  PLT  --   --   --  174 183 172 209 259  CRP 18.3* 18.1*  --   --  16.7*  --   --   --   PROCALCITON  --   --  2.05 1.51 0.94 0.59  --   --   CREATININE  --   --   --  0.74 0.70 0.70 0.64 0.73   Radiology Reports CT Chest High Resolution  Result Date: 07/06/2022 CLINICAL DATA:  81 year old female with history of complicated pneumonia. Evaluate for interstitial lung disease. EXAM: CT CHEST WITHOUT CONTRAST TECHNIQUE: Multidetector CT imaging of the chest was performed following the standard protocol without intravenous contrast. High resolution imaging of the lungs, as  well as inspiratory and expiratory imaging, was performed. RADIATION DOSE REDUCTION: This exam was performed according to the departmental dose-optimization program which includes automated exposure control, adjustment of the mA and/or kV according to patient size and/or use of iterative reconstruction technique. COMPARISON:  Cardiac CT 05/06/2017.  Chest CT 09/05/2013. FINDINGS: Cardiovascular: Heart size is borderline enlarged. There is no significant pericardial fluid, thickening or pericardial calcification. There is aortic atherosclerosis, as well as atherosclerosis of the great vessels of the mediastinum and the coronary arteries, including calcified atherosclerotic plaque in the left main, left anterior descending, left circumflex and right coronary arteries. Calcifications of the aortic valve. Left-sided pacemaker/AICD with lead tips terminating  in the right atrium and right ventricle. Mediastinum/Nodes: No pathologically enlarged mediastinal or hilar lymph nodes. Small hiatal hernia. No axillary lymphadenopathy. Lungs/Pleura: Moderate right and small left pleural effusions lying dependently. There are patchy randomly distributed areas of airspace consolidation, ground-glass attenuation, inter and intra lobular septal thickening and some mild architectural distortion scattered throughout the lungs (right greater than left), most severe in the right upper and right lower lobes, likely to reflect severe multilobar bilateral bronchopneumonia. No definite suspicious appearing pulmonary nodules or masses are noted. Inspiratory and expiratory imaging is unremarkable. Upper Abdomen: Aortic atherosclerosis. Incompletely imaged low-attenuation lesion in the left kidney measuring at least 2.9 cm in diameter, incompletely characterized on today's noncontrast CT examination, but statistically likely a cyst (no imaging follow-up is recommended). Musculoskeletal: There are no aggressive appearing lytic or blastic lesions noted in the visualized portions of the skeleton. Bilateral breast implants are incidentally noted. IMPRESSION: 1. The appearance of the chest is most compatible with acute multilobar bilateral bronchopneumonia (right greater than left) with moderate right and small left parapneumonic pleural effusions. If there is clinical concern for interstitial lung disease after resolution of the patient's acute illness, repeat high-resolution chest CT could be obtained in 6-12 months to assess for temporal changes in the appearance of the lung parenchyma. 2. Aortic atherosclerosis, in addition to left main and three-vessel coronary artery disease. 3. There are calcifications of the aortic valve. Echocardiographic correlation for evaluation of potential valvular dysfunction may be warranted if clinically indicated. Aortic Atherosclerosis (ICD10-I70.0).  Electronically Signed   By: Vinnie Langton M.D.   On: 07/06/2022 06:42   CUP PACEART REMOTE DEVICE CHECK  Result Date: 07/05/2022 Scheduled remote reviewed. Normal device function.  Next remote 91 days. LA  DG Chest Port 1 View  Result Date: 07/04/2022 CLINICAL DATA:  Headache.  Follow-up study. EXAM: PORTABLE CHEST 1 VIEW COMPARISON:  07/02/2022 and older exams. FINDINGS: Interval worsening of lung aeration. Right upper lobe airspace opacity has mildly increased. There is increased opacity in the right perihilar to lower lung. Left lung remains clear. No convincing pleural effusion.  No pneumothorax. Normal size cardiac silhouette and stable left anterior chest wall pacemaker. IMPRESSION: 1. Worsened right lung aeration compared to the most recent prior study. Right lung airspace opacities have increased as detailed consistent with multifocal pneumonia. Electronically Signed   By: Lajean Manes M.D.   On: 07/04/2022 10:14   US Abdomen Limited RUQ (LIVER/GB)  Result Date: 07/04/2022 CLINICAL DATA:  Transaminitis. EXAM: ULTRASOUND ABDOMEN LIMITED RIGHT UPPER QUADRANT COMPARISON:  CT abdomen pelvis, 08/09/2017. FINDINGS: Gallbladder: Dense layering material consistent with small stones or dense sludge. No wall thickening. No pericholecystic fluid. No sonographic Murphy's sign. Common bile duct: Diameter: 5 mm Liver: No focal lesion identified. Within normal limits in parenchymal echogenicity. Portal vein is patent on color  Doppler imaging with normal direction of blood flow towards the liver. Other: Right pleural effusion. IMPRESSION: 1. No acute findings. 2. Dense material layers in the gallbladder consistent with dense sludge or small stones. 3. Normal appearance of the liver.  No bile duct dilation. Electronically Signed   By: Lajean Manes M.D.   On: 07/04/2022 10:12   DG Swallowing Func-Speech Pathology  Result Date: 07/02/2022 Table formatting from the original result was not included.  Objective Swallowing Evaluation: Type of Study: MBS-Modified Barium Swallow Study  Patient Details Name: MARRION FINAN MRN: [id number] Date of Birth: 1941/08/22 Today's Date: 07/02/2022 Time: SLP Start Time (ACUTE ONLY): 4098 -SLP Stop Time (ACUTE ONLY): 1191 SLP Time Calculation (min) (ACUTE ONLY): 17 min Past Medical History: Past Medical History: Diagnosis Date  Anxiety   Arthritis   "hips, spine" (03/17/2018)  Bipolar II disorder (HCC)   CHF (congestive heart failure) (HCC)   Chronic bronchitis (HCC)   Chronic lower back pain   Chronic right hip pain   CKD (chronic kidney disease), stage II   Coronary artery disease   stent x1  Esophagitis, erosive   GAD (generalized anxiety disorder)   GERD (gastroesophageal reflux disease)   Headache   "maybe monthly" (03/17/2018))  Heart murmur, systolic   History of adenomatous polyp of colon   08-04-2016  tubular adenoma  History of blood transfusion 12/2017  "related to vascular hematoma"  History of electroconvulsive therapy   at [facility]--  started 04-15-2015 to 11-17-2016  total greater than 40 times  History of hiatal hernia   Hyperlipidemia   Hypertension   Hypothyroidism   Internal carotid artery stenosis, bilateral   per last duplex 05-01-2014  bilateral ICA 40-59%  Major depression, chronic   ECT treatments extensive and multiple started 07/ 2016  Memory loss   "both short and long-term; needs frequent reminders to follow instrucitons" (05/16/2017)  Migraines   "none in years" (03/17/2018)  OSA (obstructive sleep apnea)   per study 06/ 2012 moderate OSA  ; "refuses to wear masks" (03/17/2018)  Osteoporosis   Poor historian   due to short term memory loss  Presence of permanent cardiac pacemaker 03/17/2018  Pulmonary nodule   monitored by pcp  S/P placement of cardiac pacemaker 03/17/18 [facility] Jude  03/18/2018  Short-term memory loss   Sick sinus syndrome ([place])  Past Surgical History: Past Surgical History: Procedure Laterality Date  APPENDECTOMY  1971  AUGMENTATION MAMMAPLASTY  Bilateral   CARDIOVASCULAR STRESS TEST  01-30-2015  dr Aundra Dubin  Low risk nuclear study w/ no evidence ischemia or infarction/  normal LV funciton and wall motion , 76%  COLONOSCOPY W/ BIOPSIES AND POLYPECTOMY  "multiple"  COLONOSCOPY WITH ESOPHAGOGASTRODUODENOSCOPY (EGD)  last one 08-04-2016  CORONARY ANGIOPLASTY WITH STENT PLACEMENT  05/16/2017  "LAD"  CORONARY STENT INTERVENTION N/A 05/16/2017  Procedure: CORONARY STENT INTERVENTION;  Surgeon: Burnell Blanks, MD;  Location: [facility];  Service: Cardiovascular;  Laterality: N/A;  ESOPHAGOGASTRODUODENOSCOPY  02-26-04  ESOPHAGOGASTRODUODENOSCOPY  "multiple"  INSERT / REPLACE / REMOVE PACEMAKER  03/17/2018  LEFT HEART CATH AND CORONARY ANGIOGRAPHY N/A 05/16/2017  Procedure: LEFT HEART CATH AND CORONARY ANGIOGRAPHY;  Surgeon: Larey Dresser, MD;  Location: [facility];  Service: Cardiovascular;  Laterality: N/A;  OVARIAN CYST SURGERY  1970s  Laparotomy   PACEMAKER IMPLANT N/A 03/17/2018  Procedure: PACEMAKER IMPLANT;  Surgeon: Evans Lance, MD;  Location: [facility];  Service: Cardiovascular;  Laterality: N/A;  PORT-A-CATH PLACEMENT  05/31/2016; 2018  "@  [facility]; for ECT series"; "@ [facility] also"  PORT-A-CATH REMOVAL N/A 03/11/2017  Procedure: REMOVAL PORT-A-CATH;  Surgeon: Jackolyn Confer, MD;  Location: [facility];  Service: General;  Laterality: N/A;  PORTA CATH REMOVAL  03/17/2018  PORTA CATH REMOVAL  03/17/2018  Procedure: PORTA CATH REMOVAL;  Surgeon: Evans Lance, MD;  Location: [facility];  Service: Cardiovascular;;  TOTAL HIP ARTHROPLASTY Right 05/18/2022  Procedure: TOTAL HIP ARTHROPLASTY ANTERIOR APPROACH;  Surgeon: Paralee Cancel, MD;  Location: [facility];  Service: Orthopedics;  Laterality: Right;  TRANSTHORACIC ECHOCARDIOGRAM  09/05/2013  dr Aundra Dubin  mild LVH, ef 52-77%, grade 1 diastolic dysfunction/  very mild AV stenosis with mild AR/  trivial MR and PT/ mild to moderate LAE/ mild TR/ mild pulmonary  hypertension with PA peak pressure 61mHg  TUBAL LIGATION   HPI: 81y.o. female who presented to ED with 3 day history of cough, chills and a severe headache. New intermittent episodes of headache on left Meghan x 3 days prior to hospitalization of note she had similar headaches about 2 months ago which lasted for a few days.  In the hospital she was diagnosed with pneumonia and admitted to the hospital.  Neurology was consulted for headaches. Pt with medical history significant of HTN, HLD, hypothyroidism, OSA, GERD, sinus node dysfunction s/p PPM in 03/2018, bipolar and depression. Caregiver reports hx esophageal dilation x2.  Subjective: awake, cooperative but also confused, asking some repetitive questions  Recommendations for follow up therapy are one component of a multi-disciplinary discharge planning process, led by the attending physician.  Recommendations may be updated based on patient status, additional functional criteria and insurance authorization. Assessment / Plan / Recommendation   07/02/2022   2:00 PM Clinical Impressions Clinical Impression Pt has generally functional oropharyngeal swallowing, but she does appear to have a prominent CP that might partially interfere with bolus flow but leaving only trace residue within the UES or just above. No aspiration occurs and coughing throughout MBS is not related to airway protection. Of note, the barium tablet stayed briefly in the distal esophagus but did appear to clear with an additional liquid wash (MD not present to confirm). Recommend continuing regular solids and thin liquids as tolerated. SLP Visit Diagnosis Dysphagia, unspecified (R13.10) Impact on safety and function Mild aspiration risk     07/02/2022   2:00 PM Treatment Recommendations Treatment Recommendations No treatment recommended at this time     07/02/2022   2:00 PM Prognosis Prognosis for Safe Diet Advancement Good   07/02/2022   2:00 PM Diet Recommendations SLP Diet Recommendations  Regular solids;Thin liquid Liquid Administration via Cup;Straw Medication Administration Whole meds with liquid Compensations Slow rate;Small sips/bites;Minimize environmental distractions Postural Changes Seated upright at 90 degrees;Remain semi-upright after after feeds/meals (Comment)     07/02/2022   2:00 PM Other Recommendations Oral Care Recommendations Oral care BID Follow Up Recommendations No SLP follow up Assistance recommended at discharge PRN Functional Status Assessment Patient has not had a recent decline in their functional status   07/02/2022  11:50 AM Frequency and Duration  Speech Therapy Frequency (ACUTE ONLY) --     07/02/2022   2:00 PM Oral Phase [facility]   07/02/2022   2:00 PM Pharyngeal Phase [facility]   07/02/2022   2:00 PM Cervical Esophageal Phase  Cervical Esophageal Phase Impaired Thin Cup Reduced cricopharyngeal relaxation;Prominent cricopharyngeal segment Thin Straw Reduced cricopharyngeal relaxation;Prominent cricopharyngeal segment Puree Reduced cricopharyngeal relaxation;Prominent cricopharyngeal segment Regular Reduced cricopharyngeal  relaxation;Prominent cricopharyngeal segment Pill Reduced cricopharyngeal relaxation;Prominent cricopharyngeal segment Osie Bond., M.A. CCC-SLP Acute Rehabilitation Services Office [phone] Secure chat preferred 07/02/2022, 3:12 PM                     MR Brain W and Wo Contrast  Result Date: 07/02/2022 CLINICAL DATA:  Headache. EXAM: MRI HEAD WITHOUT AND WITH CONTRAST TECHNIQUE: Multiplanar, multiecho pulse sequences of the brain and surrounding structures were obtained without and with intravenous contrast. CONTRAST:  6 mL Vueway COMPARISON:  Head CT, CTA, and CTP 07/01/2022.  Head MRI 11/12/2015. FINDINGS: Postcontrast sequences are mildly motion degraded. Brain: There is no evidence of an acute infarct, intracranial hemorrhage, mass, midline shift, or extra-axial fluid collection. The ventricles and sulci are within normal limits  for age. T2 hyperintensities in the cerebral white matter and pons have progressed from 2017 and are nonspecific but compatible with mild chronic small vessel ischemic disease. No abnormal enhancement is identified. Vascular: Major intracranial vascular flow voids are preserved. Skull and upper cervical spine: Unremarkable bone marrow signal. Sinuses/Orbits: Bilateral cataract extraction. Mucosal thickening and small volume fluid in the right maxillary sinus. Trace left mastoid fluid. Other: None. IMPRESSION: 1. No acute intracranial abnormality. 2. Mild chronic small vessel ischemic disease. Electronically Signed   By: Logan Bores M.D.   On: 07/02/2022 13:37

## 2022-07-07 ENCOUNTER — Inpatient Hospital Stay (HOSPITAL_COMMUNITY): Payer: Medicare Other

## 2022-07-07 DIAGNOSIS — J90 Pleural effusion, not elsewhere classified: Secondary | ICD-10-CM | POA: Diagnosis not present

## 2022-07-07 DIAGNOSIS — R7989 Other specified abnormal findings of blood chemistry: Secondary | ICD-10-CM | POA: Diagnosis not present

## 2022-07-07 DIAGNOSIS — J189 Pneumonia, unspecified organism: Secondary | ICD-10-CM | POA: Diagnosis not present

## 2022-07-07 DIAGNOSIS — R700 Elevated erythrocyte sedimentation rate: Secondary | ICD-10-CM | POA: Diagnosis not present

## 2022-07-07 HISTORY — PX: IR THORACENTESIS ASP PLEURAL SPACE W/IMG GUIDE: IMG5380

## 2022-07-07 LAB — CBC WITH DIFFERENTIAL/PLATELET
Abs Immature Granulocytes: 0.03 10*3/uL (ref 0.00–0.07)
Basophils Absolute: 0 10*3/uL (ref 0.0–0.1)
Basophils Relative: 0 %
Eosinophils Absolute: 0.2 10*3/uL (ref 0.0–0.5)
Eosinophils Relative: 2 %
HCT: 28 % — ABNORMAL LOW (ref 36.0–46.0)
Hemoglobin: 9.1 g/dL — ABNORMAL LOW (ref 12.0–15.0)
Immature Granulocytes: 0 %
Lymphocytes Relative: 25 %
Lymphs Abs: 1.9 10*3/uL (ref 0.7–4.0)
MCH: 32.2 pg (ref 26.0–34.0)
MCHC: 32.5 g/dL (ref 30.0–36.0)
MCV: 98.9 fL (ref 80.0–100.0)
Monocytes Absolute: 0.7 10*3/uL (ref 0.1–1.0)
Monocytes Relative: 9 %
Neutro Abs: 4.7 10*3/uL (ref 1.7–7.7)
Neutrophils Relative %: 64 %
Platelets: 310 10*3/uL (ref 150–400)
RBC: 2.83 MIL/uL — ABNORMAL LOW (ref 3.87–5.11)
RDW: 14.6 % (ref 11.5–15.5)
WBC: 7.6 10*3/uL (ref 4.0–10.5)
nRBC: 0 % (ref 0.0–0.2)

## 2022-07-07 LAB — COMPREHENSIVE METABOLIC PANEL
ALT: 276 U/L — ABNORMAL HIGH (ref 0–44)
AST: 285 U/L — ABNORMAL HIGH (ref 15–41)
Albumin: 2 g/dL — ABNORMAL LOW (ref 3.5–5.0)
Alkaline Phosphatase: 73 U/L (ref 38–126)
Anion gap: 7 (ref 5–15)
BUN: 21 mg/dL (ref 8–23)
CO2: 22 mmol/L (ref 22–32)
Calcium: 9 mg/dL (ref 8.9–10.3)
Chloride: 110 mmol/L (ref 98–111)
Creatinine, Ser: 0.77 mg/dL (ref 0.44–1.00)
GFR, Estimated: >60 mL/min (ref >60)
Glucose, Bld: 100 mg/dL — ABNORMAL HIGH (ref 70–99)
Potassium: 4.2 mmol/L (ref 3.5–5.1)
Sodium: 139 mmol/L (ref 135–145)
Total Bilirubin: 0.2 mg/dL — ABNORMAL LOW (ref 0.3–1.2)
Total Protein: 5.4 g/dL — ABNORMAL LOW (ref 6.5–8.1)

## 2022-07-07 LAB — BODY FLUID CELL COUNT WITH DIFFERENTIAL
Eos, Fluid: 0 %
Lymphs, Fluid: 25 %
Monocyte-Macrophage-Serous Fluid: 17 % — ABNORMAL LOW (ref 50–90)
Neutrophil Count, Fluid: 58 % — ABNORMAL HIGH (ref 0–25)
Total Nucleated Cell Count, Fluid: 118 cu mm (ref 0–1000)

## 2022-07-07 LAB — HEPATITIS PANEL, ACUTE
HCV Ab: NONREACTIVE
Hep A IgM: NONREACTIVE
Hep B C IgM: NONREACTIVE
Hepatitis B Surface Ag: NONREACTIVE

## 2022-07-07 LAB — PROTEIN, PLEURAL OR PERITONEAL FLUID: Total protein, fluid: <3 g/dL

## 2022-07-07 LAB — LACTATE DEHYDROGENASE: LDH: 203 U/L — ABNORMAL HIGH (ref 98–192)

## 2022-07-07 LAB — GLUCOSE, PLEURAL OR PERITONEAL FLUID: Glucose, Fluid: 96 mg/dL

## 2022-07-07 LAB — SEDIMENTATION RATE: Sed Rate: 80 mm/hr — ABNORMAL HIGH (ref 0–22)

## 2022-07-07 LAB — ANCA PROFILE
Anti-MPO Antibodies: <0.2 units (ref 0.0–0.9)
Anti-PR3 Antibodies: <0.2 units (ref 0.0–0.9)
Atypical P-ANCA titer: <1:20 {titer}
C-ANCA: <1:20 {titer}
P-ANCA: <1:20 {titer}

## 2022-07-07 LAB — GRAM STAIN

## 2022-07-07 LAB — PROTEIN, TOTAL: Total Protein: 5 g/dL — ABNORMAL LOW (ref 6.5–8.1)

## 2022-07-07 LAB — AMMONIA: Ammonia: 29 umol/L (ref 9–35)

## 2022-07-07 LAB — LACTATE DEHYDROGENASE, PLEURAL OR PERITONEAL FLUID: LD, Fluid: 129 U/L — ABNORMAL HIGH (ref 3–23)

## 2022-07-07 LAB — ANTI-SMOOTH MUSCLE ANTIBODY, IGG: F-Actin IgG: 6 Units (ref 0–19)

## 2022-07-07 LAB — IGG: IgG (Immunoglobin G), Serum: 738 mg/dL (ref 586–1602)

## 2022-07-07 MED ORDER — BISACODYL 10 MG RE SUPP
10.0000 mg | Freq: Once | RECTAL | Status: AC
  Administered 2022-07-07: 10 mg via RECTAL
  Filled 2022-07-07: qty 1

## 2022-07-07 MED ORDER — NALOXEGOL OXALATE 25 MG PO TABS
25.0000 mg | ORAL_TABLET | Freq: Two times a day (BID) | ORAL | Status: DC
  Filled 2022-07-07: qty 1

## 2022-07-07 MED ORDER — LIDOCAINE HCL 1 % IJ SOLN
INTRAMUSCULAR | Status: AC
  Filled 2022-07-07: qty 20

## 2022-07-07 MED ORDER — NALOXEGOL OXALATE 25 MG PO TABS
25.0000 mg | ORAL_TABLET | Freq: Every day | ORAL | Status: DC
  Administered 2022-07-07 – 2022-07-08 (×2): 25 mg via ORAL
  Filled 2022-07-07 (×2): qty 1

## 2022-07-07 MED ORDER — LIDOCAINE HCL (PF) 1 % IJ SOLN
INTRAMUSCULAR | Status: AC | PRN
  Administered 2022-07-07: 5 mL

## 2022-07-07 MED ORDER — PREDNISONE 20 MG PO TABS
20.0000 mg | ORAL_TABLET | Freq: Every day | ORAL | Status: DC
  Administered 2022-07-08: 20 mg via ORAL
  Filled 2022-07-07: qty 1

## 2022-07-07 MED ORDER — LACTULOSE 10 GM/15ML PO SOLN
20.0000 g | Freq: Three times a day (TID) | ORAL | Status: DC
  Administered 2022-07-07 – 2022-07-08 (×2): 20 g via ORAL
  Filled 2022-07-07 (×3): qty 30

## 2022-07-07 MED ORDER — AZITHROMYCIN 500 MG PO TABS
500.0000 mg | ORAL_TABLET | Freq: Once | ORAL | Status: AC
  Administered 2022-07-07: 500 mg via ORAL
  Filled 2022-07-07: qty 1

## 2022-07-07 MED ORDER — PREDNISONE 20 MG PO TABS: 40.0000 mg | ORAL_TABLET | Freq: Every day | ORAL | Status: DC

## 2022-07-07 MED ORDER — LACTULOSE 10 GM/15ML PO SOLN
20.0000 g | Freq: Two times a day (BID) | ORAL | Status: DC
  Administered 2022-07-07: 20 g via ORAL
  Filled 2022-07-07: qty 30

## 2022-07-07 NOTE — Consult Note (Signed)
   [facility] Inpatient Consult   07/07/2022  Meghan Oliver 06-17-1941 [id number]  [facility] [ACO] Patient: Medicare ACO REACH  Primary Care Provider:  [name], MD is a primary care provider that is listed to provide the Transition of Care follow up.   Patient screened for hospitalization o assess for potential [place] Management service needs for post hospital transition for readmission prevention.  Currently, patient is being recommended for home with home health. No transitional needs assess from progress notes. Rounding note: family at the bedside.  Plan:  Continue to follow progress and disposition to assess for post hospital care management needs.    For questions contact:   Natividad Brood, RN BSN [facility] Liaison  [phone] business mobile phone Toll free office [phone]  Fax number: [phone] [email or web address]

## 2022-07-07 NOTE — Progress Notes (Signed)
PT Cancellation Note  Patient Details Name: Meghan Oliver MRN: [id number] DOB: [birth year]   Cancelled Treatment:    Reason Eval/Treat Not Completed: (P) Patient at procedure or test/unavailable (pt off unit at [facility]) Will continue efforts per PT plan of care as schedule permits.   Kara Pacer Navie Lamoreaux 07/07/2022, 10:33 AM

## 2022-07-07 NOTE — Procedures (Signed)
PROCEDURE SUMMARY:  Successful US guided right thoracentesis. Yielded 200 ml of clear yellow fluid. Pt tolerated procedure well. No immediate complications.  Specimen sent for labs. CXR ordered; no post-procedure pneumothorax identified  EBL < 2 mL  Theresa Duty, NP 07/07/2022 10:42 AM

## 2022-07-07 NOTE — Progress Notes (Signed)
This patient is receiving the antibiotic azithromycin by the intravenous route.   Based on criteria approved by the Pharmacy and Therapeutics Committee, and the  Infectious Disease Division, the antibiotic(s) is / are being converted to equivalent oral dose form(s). These criteria include:  Patient being treated for a respiratory tract infection, urinary tract infection, cellulitis, or Clostridium Difficile associated diarrhea  The patient is not neutropenic and does not exhibit a GI malabsorption state  The patient is eating (either orally or per tube) and/or has been taking other orally administered medications for at least 24 hours.  The patient is improving clinically (physician assessment and a 24-hour Tmax of 100.5 F).   If you have questions about this conversion, please contact the pharmacy department.   Thank you for allowing pharmacy to be a part of this patient's care.  Shalom Mcguiness, PharmD Clinical Pharmacist 

## 2022-07-07 NOTE — Progress Notes (Signed)
Daily Rounding Note  07/07/2022, 10:07 AM  LOS: 5 days   SUBJECTIVE:   Chief complaint:   Elevated transaminases   Slept better last night.  Gapapentin stopped today as felt to be causing worsening of chronic tremors.   Still no BM after 1 week despite Movantik, Linzess, Miralax,   OBJECTIVE:         Vital signs in last 24 hours:    Temp:  [97.8 F (36.6 C)-98.4 F (36.9 C)] 98.2 F (36.8 C) (10/04 0400) Pulse Rate:  [62-88] 64 (10/04 0800) Resp:  [11-24] 17 (10/04 0800) BP: (89-138)/(51-86) 129/57 (10/04 0800) SpO2:  [94 %-100 %] 94 % (10/04 0800) Last BM Date : 06/30/22 Filed Weights   07/01/22 0934 07/05/22 0828  Weight: 42.6 kg 40.8 kg   General: somnolent.  Awakens to voice.  Acknowledges everyone's presence but quickly drifts back off to rest.  Comfortable. Heart: RRR. Chest: Moderate cough sounds a bit loose.  No dyspnea at rest. Abdomen: Soft.  Nontender.  Active bowel sounds.  No distention. Extremities: Mild left upper extremity edema. Neuro/Psych: Acknowledges presence of myself and patient's nurse.  Follows commands.  Moves all 4 limbs.  No tremor of limbs at rest.  Intake/Output from previous day: 10/03 0701 - 10/04 0700 In: 250 [IV Piggyback:250] Out: 1610 [Urine:1850]  Intake/Output this shift: No intake/output data recorded.  Lab Results: Recent Labs    07/05/22 0100 07/06/22 0409 07/07/22 0458  WBC 6.6 7.2 7.6  HGB 8.2* 9.3* 9.1*  HCT 25.0* 28.6* 28.0*  PLT 209 259 310   BMET Recent Labs    07/05/22 0100 07/06/22 0409 07/07/22 0458  NA 134* 137 139  K 3.7 4.3 4.2  CL 110 111 110  CO2 18* 19* 22  GLUCOSE 120* 125* 100*  BUN 19 21 21   CREATININE 0.64 0.73 0.77  CALCIUM 8.5* 8.8* 9.0   LFT Recent Labs    07/05/22 0100 07/06/22 0409 07/07/22 0458  PROT 5.6* 5.8* 5.4*  ALBUMIN 2.2* 2.2* 2.0*  AST 166* 272* 285*  ALT 199* 245* 276*  ALKPHOS 74 78 73  BILITOT 0.4 0.1*  0.2*   PT/INR Recent Labs    07/04/22 1045  LABPROT 15.1  INR 1.2   Hepatitis Panel Recent Labs    07/07/22 0458  HEPBSAG NON REACTIVE  HCVAB PENDING  HEPAIGM PENDING  HEPBIGM PENDING    Studies/Results: CT Chest High Resolution  Result Date: 07/06/2022 CLINICAL DATA:  81 year old female with history of complicated pneumonia. Evaluate for interstitial lung disease. EXAM: CT CHEST WITHOUT CONTRAST TECHNIQUE: Multidetector CT imaging of the chest was performed following the standard protocol without intravenous contrast. High resolution imaging of the lungs, as well as inspiratory and expiratory imaging, was performed. RADIATION DOSE REDUCTION: This exam was performed according to the departmental dose-optimization program which includes automated exposure control, adjustment of the mA and/or kV according to patient size and/or use of iterative reconstruction technique. COMPARISON:  Cardiac CT 05/06/2017.  Chest CT 09/05/2013. FINDINGS: Cardiovascular: Heart size is borderline enlarged. There is no significant pericardial fluid, thickening or pericardial calcification. There is aortic atherosclerosis, as well as atherosclerosis of the great vessels of the mediastinum and the coronary arteries, including calcified atherosclerotic plaque in the left main, left anterior descending, left circumflex and right coronary arteries. Calcifications of the aortic valve. Left-sided pacemaker/AICD with lead tips terminating in the right atrium and right ventricle. Mediastinum/Nodes: No pathologically enlarged mediastinal or hilar lymph  nodes. Small hiatal hernia. No axillary lymphadenopathy. Lungs/Pleura: Moderate right and small left pleural effusions lying dependently. There are patchy randomly distributed areas of airspace consolidation, ground-glass attenuation, inter and intra lobular septal thickening and some mild architectural distortion scattered throughout the lungs (right greater than left), most  severe in the right upper and right lower lobes, likely to reflect severe multilobar bilateral bronchopneumonia. No definite suspicious appearing pulmonary nodules or masses are noted. Inspiratory and expiratory imaging is unremarkable. Upper Abdomen: Aortic atherosclerosis. Incompletely imaged low-attenuation lesion in the left kidney measuring at least 2.9 cm in diameter, incompletely characterized on today's noncontrast CT examination, but statistically likely a cyst (no imaging follow-up is recommended). Musculoskeletal: There are no aggressive appearing lytic or blastic lesions noted in the visualized portions of the skeleton. Bilateral breast implants are incidentally noted. IMPRESSION: 1. The appearance of the chest is most compatible with acute multilobar bilateral bronchopneumonia (right greater than left) with moderate right and small left parapneumonic pleural effusions. If there is clinical concern for interstitial lung disease after resolution of the patient's acute illness, repeat high-resolution chest CT could be obtained in 6-12 months to assess for temporal changes in the appearance of the lung parenchyma. 2. Aortic atherosclerosis, in addition to left main and three-vessel coronary artery disease. 3. There are calcifications of the aortic valve. Echocardiographic correlation for evaluation of potential valvular dysfunction may be warranted if clinically indicated. Aortic Atherosclerosis (ICD10-I70.0). Electronically Signed   By: Vinnie Langton M.D.   On: 07/06/2022 06:42     Scheduled Meds:  aspirin  81 mg Oral Daily   azithromycin  500 mg Oral Once   benzonatate  100 mg Oral TID   cyanocobalamin  1,000 mcg Oral QHS   enoxaparin (LOVENOX) injection  30 mg Subcutaneous Q24H   fesoterodine  8 mg Oral Daily   fluticasone  2 spray Each Nare Daily   lactulose  20 g Oral BID   levothyroxine  75 mcg Oral Q0600   lidocaine       linaclotide  72 mcg Oral BID   lithium carbonate  300 mg Oral  QHS   loratadine  10 mg Oral Daily   memantine  10 mg Oral BID   mirtazapine  7.5 mg Oral QHS   naloxegol oxalate  25 mg Oral Daily   nortriptyline  75 mg Oral QHS   pantoprazole  40 mg Oral BID AC   PARoxetine  20 mg Oral QHS   [START ON 07/08/2022] predniSONE  40 mg Oral Q breakfast   primidone  100 mg Oral q AM   primidone  50 mg Oral QHS   vitamin B-6  500 mg Oral BID   rosuvastatin  20 mg Oral Daily   senna-docusate  2 tablet Oral BID   Continuous Infusions:  cefTRIAXone (ROCEPHIN)  IV 1 g (07/07/22 1048)   PRN Meds:.acetaminophen **OR** acetaminophen, albuterol, guaiFENesin-codeine, guaiFENesin-dextromethorphan, lidocaine, LORazepam, LORazepam, ondansetron (ZOFRAN) IV, polyethylene glycol, traMADol  ASSESMENT:     Acute on chronic elevation of transaminases.  Steady rise continues.  T. bili, alk phos not elevated.  INR normal.  Normal ultrasound.  Acute hepatitis panel, mitochondrial antibodies, alpha 1 antitrypsin, ceruloplasmin pndg.  Note attending MD ordered Ammonia level as well.  Dr Lorenso Courier supsects rise is due to sepsis/infection and possible DILI but no current indication to stop any of her many meds.    CAP.  Progressive multi lobar bilateral bronchopneumonia.  100 mL diagnostic thoracentesis this AM.  Has never had  SOB.  Cough improved but persists and a bit more productive.  Elevated CRP, ESR.  Finished 6 d Rocephin today.  Azithromycin and Prednisone continue.   Anemia.  Fluctuating hemoglobins between 8.2 -10.1.  9.1 today.  Was 12 four weeks ago.  Initially macrocytic, now normocytic.  IBS-C.  Chronic, lifelong and worse in setting of limited mobility, acute illness, polypharmacy.   Limited tramadol but no Narcotics in use.      05/18/22 L hip replacement, elective.     PLAN     Await pndg labs.  At present, no plans for additional imaging or liver bz.  Continue efforts to combat constipation.      Azucena Freed  07/07/2022, 10:07 AM Phone [phone]

## 2022-07-07 NOTE — Progress Notes (Signed)
Physical Therapy Treatment Patient Details Name: Meghan Oliver MRN: [id number] DOB: Aug 08, 1941 Today's Date: 07/07/2022   History of Present Illness Pt is an 81 year old female presented to [facility] with 3 day history of cough, chills and a severe headache. +pna;  Right THA on 05/18/22. s/p temporal artery bx on 10/2.  PMHx significant for but not limited to: memory loss, bipolar II, PPM, CHF, chronic low back pain, HTN    PT Comments    Pt received in recliner, agreeable to therapy session and with good participation and tolerance for gait and transfer training this date. Pt making good progress toward goals, able to progress to +1 assist for household distance ambulation task using RW and initiated instruction on use of incentive spirometer and flutter valve. Pt needing frequent safety cues but only up to min guard for stability using RW. Pt continues to benefit from PT services to progress toward functional mobility goals.    Recommendations for follow up therapy are one component of a multi-disciplinary discharge planning process, led by the attending physician.  Recommendations may be updated based on patient status, additional functional criteria and insurance authorization.  Follow Up Recommendations  Home health PT     Assistance Recommended at Discharge Intermittent Supervision/Assistance  Patient can return home with the following A little help with walking and/or transfers;A little help with bathing/dressing/bathroom;Assistance with cooking/housework;Direct supervision/assist for medications management;Direct supervision/assist for financial management;Assist for transportation;Help with stairs or ramp for entrance   Equipment Recommendations  Wheelchair (measurements PT);Wheelchair cushion (measurements PT);Other (comment) (pending progress; may benefit due to current deconditioning for longer household distances or community distance ambulation tasks)    Recommendations for Other  Services       Precautions / Restrictions Precautions Precautions: Fall Precaution Comments: BLE buckling on 10/3 Restrictions Weight Bearing Restrictions: No     Mobility  Bed Mobility Overal bed mobility: Needs Assistance             General bed mobility comments: OOB upon entry    Transfers Overall transfer level: Needs assistance Equipment used: Rolling walker (2 wheels) Transfers: Sit to/from Stand Sit to Stand: Min guard           General transfer comment: min guard for safety and cues for safe UE placement needed each rep; x6 total reps from chair to RW    Ambulation/Gait Ambulation/Gait assistance: Min guard Gait Distance (Feet): 40 Feet (3f, seated break, 442f Assistive device: Rolling walker (2 wheels) Gait Pattern/deviations: Step-to pattern, Trunk flexed, Step-through pattern       General Gait Details: pt keeping RW too far advanced and needs mod cues for posture and RW placement, no buckling this date; HR/Sp[facility]2 [facility]      Balance Overall balance assessment: Needs assistance Sitting-balance support: Feet supported, Bilateral upper extremity supported, No upper extremity supported Sitting balance-Leahy Scale: Fair Sitting balance - Comments: pt able to adjust both socks seated forward in chair without LOB   Standing balance support: Bilateral upper extremity supported, During functional activity Standing balance-Leahy Scale: Fair Standing balance comment: light UE reliance on RW, min guard for safety due to instability on previous date                            Cognition Arousal/Alertness: Awake/alert Behavior During Therapy: Flat affect Overall Cognitive Status: Impaired/Different from baseline Area of Impairment: Problem solving, Awareness, Memory, Following commands, Attention  Current Attention Level: Focused Memory: Decreased short-term memory Following Commands: Follows one step commands  inconsistently   Awareness: Intellectual Problem Solving: Slow processing, Difficulty sequencing, Requires verbal cues, Requires tactile cues General Comments: calm and cooperative, decreased carryover of safety cues noted within session        Exercises Other Exercises Other Exercises: IS x 5 reps, flutter valve x 5 reps, handout to reinforce safe technique Other Exercises: STS x 3 reps reciprocal with cues for safe UE placement each time Other Exercises: standing hip flexion x 5 reps    General Comments General comments (skin integrity, edema, etc.): VSS on RA; frequent coughing      Pertinent Vitals/Pain Pain Assessment Pain Assessment: No/denies pain Pain Location: frequent coughing but no c/o pain Pain Intervention(s): Monitored during session     PT Goals (current goals can now be found in the care plan section) Acute Rehab PT Goals Patient Stated Goal: per caregiver, to go home with spouse once more physically stable PT Goal Formulation: Patient unable to participate in goal setting Time For Goal Achievement: 07/17/22 Progress towards PT goals: Progressing toward goals    Frequency    Min 3X/week      PT Plan Current plan remains appropriate;Equipment recommendations need to be updated       AM-PAC PT "6 Clicks" Mobility   Outcome Measure  Help needed turning from your back to your side while in a flat bed without using bedrails?: A Little Help needed moving from lying on your back to sitting on the side of a flat bed without using bedrails?: A Little Help needed moving to and from a bed to a chair (including a wheelchair)?: A Little Help needed standing up from a chair using your arms (e.g., wheelchair or bedside chair)?: A Little Help needed to walk in hospital room?: A Little Help needed climbing 3-5 steps with a railing? : A Lot 6 Click Score: 17    End of Session Equipment Utilized During Treatment: Gait belt Activity Tolerance: Patient tolerated  treatment well;Other (comment) (ice cream arriving during session, pt eager to eat ice cream) Patient left: in chair;with call bell/phone within reach;with family/visitor present (spouse in room with her) Nurse Communication: Mobility status PT Visit Diagnosis: Unsteadiness on feet (R26.81);Other abnormalities of gait and mobility (R26.89);Muscle weakness (generalized) (M62.81)     Time: 1431-1500 PT Time Calculation (min) (ACUTE ONLY): 29 min  Charges:  $Gait Training: 8-22 mins $Therapeutic Exercise: 8-22 mins                     Kourtland Coopman P., PTA Acute Rehabilitation Services Secure Chat Preferred 9a-5:30pm Office: [place] 07/07/2022, 3:38 PM

## 2022-07-07 NOTE — TOC Progression Note (Signed)
Transition of Care [facility]) - Progression Note    Patient Details  Name: Meghan Oliver MRN: [id number] Date of Birth: [birth year]  Transition of Care [facility]) CM/SW [place], RN Phone Number: 07/07/2022, 2:31 PM  Clinical Narrative:     Home health orders. Spoke to Husband, Dr. Velora Heckler, regarding home health. Husband would like to use [facility] due to [facility] would charge for [facility] aide.  Cory wit [facility] accepted referral. Patient will have a caregiver for support at home. Husband doesn't want patient to go to SNF. Butch Penny at [facility] is aware of patient's admission.   Address, Phone number and PCP verified.  TOC will continue to follow for needs.      Expected Discharge Plan: [place] Barriers to Discharge: Continued Medical Work up  Expected Discharge Plan and Services Expected Discharge Plan: [place]   Discharge Planning Services: CM Consult Post Acute Care Choice: [place] arrangements for the past 2 months: [place])                           [place] Arranged: RN, OT, PT, Nurse's Aide, Speech Therapy   Date HH Agency Contacted: 07/07/22 Time [place]: 87 Representative spoke with at [place]: [place] ([place]) Interventions    Readmission Risk Interventions     No data to display

## 2022-07-07 NOTE — Progress Notes (Signed)
PROGRESS NOTE                                                                                                                                                                                                             Patient Demographics:    Meghan Oliver, is a 81 y.o. female, DOB - 11/10/1940, XTG:[id number]  Outpatient Primary MD for the patient is Baxley, Cresenciano Lick, MD    LOS - 5  Admit date - 07/01/2022    Chief Complaint  Patient presents with   Headache       Brief Narrative (HPI from H&P)    81 y.o. female with medical history significant of HTN, HLD, hypothyroidism, OSA, GERD, sinus node dysfunction s/p PPM in 03/2018, bipolar and depression who presented to ED with 3 day history of cough, chills and a severe headache. New intermittent episodes of headache on left temple x 3 days prior to hospitalization of note she had similar headaches about 2 months ago which lasted for a few days.  No photophobia, no jaw claudication, no problems chewing food.  No temporal tenderness.  In the hospital she was diagnosed with pneumonia and admitted to the hospital.  Neurology was consulted for headaches.   Subjective:   No significant events overnight, patient, her caregiver at bedside, reports she had a good night sleep, she is status post right lung thoracentesis, she denies any chest pain, reports her main issue is cough, her appetite remains poor.  She denies any headache or visual changes today  Assessment  & Plan :    Community acquired pneumonia  Right pleural effusion -Pulmonary  input greatly appreciated, CT findings more suspicious for infectious etiology . -There is concern for vasculitis, follow-up on autoimmune work-up, so far ANA is negative . -She is on steroids, which should help in case she has vasculitis  -Status post thoracentesis for pleural effusion today, work-up is pending   Intermittent episodes of left  temporal headache  - this has been ongoing on a intermittent basis for a few days, 1 such episode happened a few months ago but resolved by itself, she had MRI brain, CTA head, vascular ultrasound of the temporal artery which were all unremarkable. -Peripheral artery biopsy by vascular surgery 10/2 is negative . however the yield of temporal artery biopsy is about 60%.  40% can  be falsely negative, previous MD discussed the plan with patient's husband and we will continue  prednisone upon discharge till she sees otologist Dr. Estanislado Pandy, meanwhile she can be lowered to prednisone 40 mg oral daily as discussed with neurology. - After discussions with pulmonary and neurology therapeutic trial of steroids was started on 07/05/2022.   Asymptomatic transaminitis.   -GI input greatly appreciated, work-up is pending.  Elevated troponin - She has no chest pain or ekg changes, troponin trend flat and not an ACS pattern, continue aspirin and statin for secondary prevention Case discussed with her cardiologist Dr. Loralie Champagne, no further work-up for now.  Macrocytic anemia - Hb nadir of 9.7 after hip surgery in August, iron studies inconclusive, B12 stable, continue to monitor no signs of infection.  Some drop due to hemodilution.  Continue PPI twice daily.  Some fall due to hemodilution from IV fluids.  Continue to monitor.  Bipolar/depression - History of ECT x 30 in past, Continue home medication of paxil, pamelor, remeron and ativan PRN and lithium, stable lithium level.  Sinus node dysfunction (HCC) - S/p St. Jude PPM in 03/2018.   Memory loss - Continue Namenda, at risk for delirium and aspiration.  Discussed with her primary nurse and husband on 07/02/2022.  Hyperlipidemia - Continue Crestor.   Hypothyroidism - TSH wnl in 03/2022, Continue home synthroid.   Hyponatremia - likely SIADH, hold IVF, much improved, monitor.  Chronic constipation - Continue home senokot, linzess, miralaz prn, also has  movik prn, giving a dose on 07/04/2022.   GERD - Continue PPI.   OSA (obstructive sleep apnea) - Not on cpap, non compliant    Hypomagnesemia.  Replaced.       Condition - Extremely Guarded  Family Communication  :    Did her caregiver/personal nurse at bedside today, will update husband later today as he is anticipated to be here early afternoon.  Code Status :  Full  Consults  :  Neuro, vascular surgery, GI, pulmonary  PUD Prophylaxis : PPI   Procedures  :     Left temporal artery biopsy done by vascular surgeon Dr. Quentin Cornwall on 07/05/2022.    MRI - 1. No acute intracranial abnormality. 2. Mild chronic small vessel ischemic disease  [name] Temp Artery - Stable  CT scan head, CT angiogram head and neck and CT venogram nonacute  RUQ [name] - 1. No acute findings. 2. Dense material layers in the gallbladder consistent with dense sludge or small stones. 3. Normal appearance of the liver.  No bile duct dilation.       Disposition Plan  :    Status is: Observation  DVT Prophylaxis  :    enoxaparin (LOVENOX) injection 30 mg Start: 07/01/22 1915   Lab Results  Component Value Date   PLT 310 07/07/2022    Diet :  Diet Order             DIET SOFT Room service appropriate? No; Fluid consistency: Thin  Diet effective now                    Inpatient Medications  Scheduled Meds:  aspirin  81 mg Oral Daily   azithromycin  500 mg Oral Once   benzonatate  100 mg Oral TID   cyanocobalamin  1,000 mcg Oral QHS   enoxaparin (LOVENOX) injection  30 mg Subcutaneous Q24H   fesoterodine  8 mg Oral Daily   fluticasone  2 spray Each Nare Daily  lactulose  20 g Oral BID   levothyroxine  75 mcg Oral Q0600   lidocaine       linaclotide  72 mcg Oral BID   lithium carbonate  300 mg Oral QHS   loratadine  10 mg Oral Daily   memantine  10 mg Oral BID   mirtazapine  7.5 mg Oral QHS   naloxegol oxalate  25 mg Oral Daily   nortriptyline  75 mg Oral QHS   pantoprazole  40 mg  Oral BID AC   PARoxetine  20 mg Oral QHS   predniSONE  60 mg Oral Q breakfast   primidone  100 mg Oral q AM   primidone  50 mg Oral QHS   vitamin B-6  500 mg Oral BID   rosuvastatin  20 mg Oral Daily   senna-docusate  2 tablet Oral BID   Continuous Infusions:  cefTRIAXone (ROCEPHIN)  IV 1 g (07/07/22 1048)   PRN Meds:.acetaminophen **OR** acetaminophen, albuterol, guaiFENesin-codeine, guaiFENesin-dextromethorphan, lidocaine, LORazepam, LORazepam, ondansetron (ZOFRAN) IV, polyethylene glycol, traMADol  Time Spent in minutes  30   Phillips Climes M.D on 07/07/2022 at 11:09 AM  To page go to [email or web address]   [facility] -  Office  [phone]  See all Orders from today for further details    Objective:   Vitals:   07/07/22 0200 07/07/22 0300 07/07/22 0400 07/07/22 0800  BP:   (!) 138/51 (!) 129/57  Pulse: 69 67 66 64  Resp: '17 17 17 17  '$ Temp:   98.2 F (36.8 C)   TempSrc:   Oral   SpO2: 94% 95% 95% 94%  Weight:      Height:        Wt Readings from Last 3 Encounters:  07/05/22 40.8 kg  05/18/22 42.6 kg  03/31/22 44.7 kg     Intake/Output Summary (Last 24 hours) at 07/07/2022 1109 Last data filed at 07/07/2022 0600 Gross per 24 hour  Intake --  Output 1850 ml  Net -1850 ml     Physical Exam  Awake Alert, Oriented X 3, No new F.N deficits, frail Right of left temporal artery biopsy healing nicely Symmetrical Chest wall movement, lung Rales at the bases RRR,No Gallops,Rubs or new Murmurs, No Parasternal Heave +ve B.Sounds, Abd Soft, No tenderness, No rebound - guarding or rigidity. No Cyanosis, Clubbing or edema, No new Rash or bruise      Data Review:    CBC Recent Labs  Lab 07/03/22 0652 07/04/22 0625 07/05/22 0100 07/06/22 0409 07/07/22 0458  WBC 7.5 6.6 6.6 7.2 7.6  HGB 8.9* 8.8* 8.2* 9.3* 9.1*  HCT 26.9* 26.7* 25.0* 28.6* 28.0*  PLT 183 172 209 259 310  MCV 100.0 100.8* 98.8 99.7 98.9  MCH 33.1 33.2 32.4 32.4 32.2  MCHC 33.1 33.0  32.8 32.5 32.5  RDW 13.7 14.2 14.2 14.6 14.6  LYMPHSABS 1.0 0.9 1.0 1.4 1.9  MONOABS 1.0 1.0 0.9 0.8 0.7  EOSABS 0.1 0.2 0.2 0.1 0.2  BASOSABS 0.0 0.0 0.0 0.0 0.0    Electrolytes Recent Labs  Lab 07/01/22 1618 07/01/22 1818 07/01/22 2052 07/02/22 0737 07/03/22 0652 07/04/22 0625 07/04/22 1045 07/05/22 0100 07/06/22 0409 07/07/22 0458  NA  --   --   --  135 135 133*  --  134* 137 139  K  --   --   --  3.9 4.0 3.9  --  3.7 4.3 4.2  CL  --   --   --  107 109 111  --  110 111 110  CO2  --   --   --  24 19* 18*  --  18* 19* 22  GLUCOSE  --   --   --  102* 101* 88  --  120* 125* 100*  BUN  --   --   --  '20 11 14  '$ --  '19 21 21  '$ CREATININE  --   --   --  0.74 0.70 0.70  --  0.64 0.73 0.77  CALCIUM  --   --   --  8.0* 8.1* 8.0*  --  8.5* 8.8* 9.0  AST  --   --   --   --  133* 200*  --  166* 272* 285*  ALT  --   --   --   --  170* 214*  --  199* 245* 276*  ALKPHOS  --   --   --   --  62 72  --  74 78 73  BILITOT  --   --   --   --  0.4 0.4  --  0.4 0.1* 0.2*  ALBUMIN  --   --   --   --  2.4* 2.2*  --  2.2* 2.2* 2.0*  MG  --   --   --  1.6* 2.5* 2.1  --  1.9 2.0  --   CRP 18.3* 18.1*  --   --  16.7*  --   --   --   --   --   PROCALCITON  --   --  2.05 1.51 0.94 0.59  --   --   --   --   INR  --   --   --   --   --   --  1.2  --   --   --   BNP  --   --  206.2*  --  510.5* 443.2*  --  311.3* 287.2*  --     ------------------------------------------------------------------------------------------------------------------ No results for input(s): "CHOL", "HDL", "LDLCALC", "TRIG", "CHOLHDL", "LDLDIRECT" in the last 72 hours.  Lab Results  Component Value Date   HGBA1C 5.1 02/15/2011    No results for input(s): "TSH", "T4TOTAL", "T3FREE", "THYROIDAB" in the last 72 hours.  Invalid input(s): "FREET3" ------------------------------------------------------------------------------------------------------------------ ID Labs Recent Labs  Lab 07/01/22 1618 07/01/22 1818  07/01/22 2052 07/02/22 0737 07/03/22 2426 07/04/22 0625 07/05/22 0100 07/06/22 0409 07/07/22 0458  WBC  --   --   --  8.9 7.5 6.6 6.6 7.2 7.6  PLT  --   --   --  174 183 172 209 259 310  CRP 18.3* 18.1*  --   --  16.7*  --   --   --   --   PROCALCITON  --   --  2.05 1.51 0.94 0.59  --   --   --   CREATININE  --   --   --  0.74 0.70 0.70 0.64 0.73 0.77   Radiology Reports IR THORACENTESIS ASP PLEURAL SPACE W/IMG GUIDE  Result Date: 07/07/2022 INDICATION: Patient with a history of heart failure presents to the hospital with temporal headaches, dry cough and chills. Image findings showed right upper lobe pneumonia and pleural effusions. Interventional radiology asked to perform a diagnostic and therapeutic thoracentesis. EXAM: ULTRASOUND GUIDED THORACENTESIS MEDICATIONS: 1% lidocaine 10 mL COMPLICATIONS: None immediate. PROCEDURE: An ultrasound guided thoracentesis was thoroughly discussed with the patient and questions answered. The benefits, risks, alternatives and complications were also discussed. The patient  understands and wishes to proceed with the procedure. Written consent was obtained. Ultrasound was performed to localize and mark an adequate pocket of fluid in the right chest. The area was then prepped and draped in the normal sterile fashion. 1% Lidocaine was used for local anesthesia. Under ultrasound guidance a 6 Fr Safe-T-Centesis catheter was introduced. Thoracentesis was performed. The catheter was removed and a dressing applied. FINDINGS: A total of approximately 200 mL of clear yellow fluid was removed. Samples were sent to the laboratory as requested by the clinical team. IMPRESSION: Successful ultrasound guided right thoracentesis yielding 200 mL of pleural fluid. Read by: Soyla Dryer, NP Electronically Signed   By: Sandi Mariscal M.D.   On: 07/07/2022 10:48   DG Chest 1 View  Result Date: 07/07/2022 CLINICAL DATA:  Status post right thoracentesis. EXAM: CHEST  1 VIEW  COMPARISON:  Chest x-ray 07/04/2022 FINDINGS: The cardiac silhouette, mediastinal and hilar contours are within normal limits and stable. Persistent airspace process in the right lung. No residual pleural effusion and no evidence of postprocedural pneumothorax. IMPRESSION: 1. No residual pleural effusion or postprocedural pneumothorax. 2. Persistent right lung airspace process. Electronically Signed   By: Marijo Sanes M.D.   On: 07/07/2022 10:33   CT Chest High Resolution  Result Date: 07/06/2022 CLINICAL DATA:  81 year old female with history of complicated pneumonia. Evaluate for interstitial lung disease. EXAM: CT CHEST WITHOUT CONTRAST TECHNIQUE: Multidetector CT imaging of the chest was performed following the standard protocol without intravenous contrast. High resolution imaging of the lungs, as well as inspiratory and expiratory imaging, was performed. RADIATION DOSE REDUCTION: This exam was performed according to the departmental dose-optimization program which includes automated exposure control, adjustment of the mA and/or kV according to patient size and/or use of iterative reconstruction technique. COMPARISON:  Cardiac CT 05/06/2017.  Chest CT 09/05/2013. FINDINGS: Cardiovascular: Heart size is borderline enlarged. There is no significant pericardial fluid, thickening or pericardial calcification. There is aortic atherosclerosis, as well as atherosclerosis of the great vessels of the mediastinum and the coronary arteries, including calcified atherosclerotic plaque in the left main, left anterior descending, left circumflex and right coronary arteries. Calcifications of the aortic valve. Left-sided pacemaker/AICD with lead tips terminating in the right atrium and right ventricle. Mediastinum/Nodes: No pathologically enlarged mediastinal or hilar lymph nodes. Small hiatal hernia. No axillary lymphadenopathy. Lungs/Pleura: Moderate right and small left pleural effusions lying dependently. There are  patchy randomly distributed areas of airspace consolidation, ground-glass attenuation, inter and intra lobular septal thickening and some mild architectural distortion scattered throughout the lungs (right greater than left), most severe in the right upper and right lower lobes, likely to reflect severe multilobar bilateral bronchopneumonia. No definite suspicious appearing pulmonary nodules or masses are noted. Inspiratory and expiratory imaging is unremarkable. Upper Abdomen: Aortic atherosclerosis. Incompletely imaged low-attenuation lesion in the left kidney measuring at least 2.9 cm in diameter, incompletely characterized on today's noncontrast CT examination, but statistically likely a cyst (no imaging follow-up is recommended). Musculoskeletal: There are no aggressive appearing lytic or blastic lesions noted in the visualized portions of the skeleton. Bilateral breast implants are incidentally noted. IMPRESSION: 1. The appearance of the chest is most compatible with acute multilobar bilateral bronchopneumonia (right greater than left) with moderate right and small left parapneumonic pleural effusions. If there is clinical concern for interstitial lung disease after resolution of the patient's acute illness, repeat high-resolution chest CT could be obtained in 6-12 months to assess for temporal changes in the appearance of the  lung parenchyma. 2. Aortic atherosclerosis, in addition to left main and three-vessel coronary artery disease. 3. There are calcifications of the aortic valve. Echocardiographic correlation for evaluation of potential valvular dysfunction may be warranted if clinically indicated. Aortic Atherosclerosis (ICD10-I70.0). Electronically Signed   By: Vinnie Langton M.D.   On: 07/06/2022 06:42   DG Chest Port 1 View  Result Date: 07/04/2022 CLINICAL DATA:  Headache.  Follow-up study. EXAM: PORTABLE CHEST 1 VIEW COMPARISON:  07/02/2022 and older exams. FINDINGS: Interval worsening of lung  aeration. Right upper lobe airspace opacity has mildly increased. There is increased opacity in the right perihilar to lower lung. Left lung remains clear. No convincing pleural effusion.  No pneumothorax. Normal size cardiac silhouette and stable left anterior chest wall pacemaker. IMPRESSION: 1. Worsened right lung aeration compared to the most recent prior study. Right lung airspace opacities have increased as detailed consistent with multifocal pneumonia. Electronically Signed   By: Lajean Manes M.D.   On: 07/04/2022 10:14   US Abdomen Limited RUQ (LIVER/GB)  Result Date: 07/04/2022 CLINICAL DATA:  Transaminitis. EXAM: ULTRASOUND ABDOMEN LIMITED RIGHT UPPER QUADRANT COMPARISON:  CT abdomen pelvis, 08/09/2017. FINDINGS: Gallbladder: Dense layering material consistent with small stones or dense sludge. No wall thickening. No pericholecystic fluid. No sonographic Murphy's sign. Common bile duct: Diameter: 5 mm Liver: No focal lesion identified. Within normal limits in parenchymal echogenicity. Portal vein is patent on color Doppler imaging with normal direction of blood flow towards the liver. Other: Right pleural effusion. IMPRESSION: 1. No acute findings. 2. Dense material layers in the gallbladder consistent with dense sludge or small stones. 3. Normal appearance of the liver.  No bile duct dilation. Electronically Signed   By: Lajean Manes M.D.   On: 07/04/2022 10:12

## 2022-07-07 NOTE — Progress Notes (Signed)
NAME:  Meghan Oliver, MRN:  [id number], DOB:  1941/06/06, LOS: 5 ADMISSION DATE:  07/01/2022, CONSULTATION DATE:  07/07/22 REFERRING MD:  Candiss Norse, CHIEF COMPLAINT:  pulmonary infiltrate    History of Present Illness:  Meghan Oliver is a 81 y.o. F with PMH significant for HFpEF, HTN, OSA, sinus node dysfunction with PPM, bipolar, depression who presented to the hospital with L temporal headaches and, dry cough and chills for three days.  She had one similar headache that lasted a day 1-2 months ago.  She denied photophobia or phonophobia, no vision changes.  She felt warm to the touch, but did not have fever prior to admission.  No nausea or vomiting.  CXR showed RUL pneumonia and she was admitted to [facility].  She was treated with Azithromycin, Ceftriaxone and steroids.  She had an elevated ESR and vascular surgery was consulted for temporal artery biopsy the results of which are pending.    Her procalcitonin was elevated at 2.05, this down-trended to 0.94, however a repeat CXR on 10/1 showed worsening R lung opacities.  She required 2-4L intermittent [place] oxygen. ESR continued to rise and   PCCM consulted for possible vasculitic infiltrate  Pertinent  Medical History   has a past medical history of Anxiety, Arthritis, Bipolar II disorder ([place]), CHF (congestive heart failure) (HCC), Chronic bronchitis ([place]), Chronic lower back pain, Chronic right hip pain, CKD (chronic kidney disease), stage II, Coronary artery disease, Esophagitis, erosive, GAD (generalized anxiety disorder), GERD (gastroesophageal reflux disease), Headache, Heart murmur, systolic, History of adenomatous polyp of colon, History of blood transfusion (12/2017), History of electroconvulsive therapy, History of hiatal hernia, Hyperlipidemia, Hypertension, Hypothyroidism, Internal carotid artery stenosis, bilateral, Major depression, chronic, Memory loss, Migraines, OSA (obstructive sleep apnea), Osteoporosis, Poor historian, Presence of permanent  cardiac pacemaker (03/17/2018), Pulmonary nodule, S/P placement of cardiac pacemaker 03/17/18 [employer] Jude  (03/18/2018), Short-term memory loss, and Sick sinus syndrome ([place]).   Significant Hospital Events: Including procedures, antibiotic start and stop dates in addition to other pertinent events   9/28 admitted to [facility] 10/2 temporal artery bx, PCCM consult 10/4 R thoracentesis performed w/ 200 ml of clear/yellow fluid removed  Interim History / Subjective:  R thoracentesis performed this am w/ 200 ml of clear/yellow fluid removed.  CXR w/ persistent R lung opacities Remains on room air  Objective   Blood pressure (!) 129/57, pulse 64, temperature 98.2 F (36.8 C), temperature source Oral, resp. rate 17, height _0  (1.473 m), weight 40.8 kg, SpO2 94 %.        Intake/Output Summary (Last 24 hours) at 07/07/2022 1140 Last data filed at 07/07/2022 0600 Gross per 24 hour  Intake --  Output 1850 ml  Net -1850 ml    Filed Weights   07/01/22 0934 07/05/22 0828  Weight: 42.6 kg 40.8 kg   General:  NAD HEENT: MM pink/moist Neuro: Aox3; MAE CV: s1s2, no m/r/g PULM:  dim clear BS bilaterally; on room air GI: soft, bsx4 active  Extremities: warm/dry, no edema  Skin: no rashes or lesions appreciated  High-resolution CT 07/05/2022-moderate effusion left greater than right, patchy airspace consolidation most severe in the right upper and lower lobes.  Temporal artery biopsy 07/05/2022-no evidence of arteritis or granuloma  ANA 07/05/2022-negative, double-stranded DNA 1  Resolved Hospital Problem list     Assessment & Plan:  Community-acquired pneumonia on the right There is concern for vasculitis though review of high-res CT shows current findings more consistent with pneumonia ANA is negative, ANCA,  CCP and rheumatoid factors are pending Temporal artery biopsy results noted with no evidence of arteritis or granuloma Small R pleural effusion: 10/4 thora performed by IR with 200 cc  clear/yellow fluid removed P: -on room air -continue abx per primary -continue steroids -f/u rheum labs -pulm toiletry: IS/flutter -oob as tolerated; PT/OT -f/u results from thoracentesis 10/4 -trend CXR -will need repeat CT in 6-12 months to ensure no underlying interstitial changes    Signature:    JD Geryl [facility] 07/07/2022, 11:43 AM  Please see [email or web address] for pager details.  From 7A-7P if no response, please call [phone]. After hours, please call ELink [phone].

## 2022-07-07 NOTE — Progress Notes (Addendum)
Patient seen and examined at bedside.  Husband and brother-in-law at bedside as well. Temporal artery biopsy negative.  Patient's headache is better. She did receive a small IV load of steroids and has been on prednisone for a couple of days.  Symptoms not very convincing for giant cell arteritis.  Temporal artery biopsy also negative.  Low suspicion for GCA at this time.  Vasculitic work-up including ANA [place] is negative.  ANCA, CCP and rheumatoid factors are pending.  Given the significant side effects including psychiatric side effects of prednisone, I would not be in favor of continuing it long-term unless there is a compelling reason. Pulmonology also in favor of quick taper of steroids which I am in agreement with.  Plan was discussed with Dr. Lamonte Sakai and Dr. Waldron Labs.  Neurology will be available as needed  Outpatient follow-up with neurology-preferably headache specialists at [facility] neurology-Dr. Lavell Anchors or Dr. Billey Gosling in 6 to 8 weeks.  -- Meghan Portland, MD Neurologist [facility] Pager: [phone]  No charge note

## 2022-07-08 ENCOUNTER — Other Ambulatory Visit (HOSPITAL_COMMUNITY): Payer: Self-pay

## 2022-07-08 ENCOUNTER — Other Ambulatory Visit (HOSPITAL_BASED_OUTPATIENT_CLINIC_OR_DEPARTMENT_OTHER): Payer: Self-pay

## 2022-07-08 DIAGNOSIS — J189 Pneumonia, unspecified organism: Secondary | ICD-10-CM | POA: Diagnosis not present

## 2022-07-08 DIAGNOSIS — K5909 Other constipation: Secondary | ICD-10-CM

## 2022-07-08 DIAGNOSIS — R519 Headache, unspecified: Secondary | ICD-10-CM | POA: Diagnosis not present

## 2022-07-08 LAB — CBC
HCT: 26.8 % — ABNORMAL LOW (ref 36.0–46.0)
Hemoglobin: 8.6 g/dL — ABNORMAL LOW (ref 12.0–15.0)
MCH: 32.2 pg (ref 26.0–34.0)
MCHC: 32.1 g/dL (ref 30.0–36.0)
MCV: 100.4 fL — ABNORMAL HIGH (ref 80.0–100.0)
Platelets: 280 10*3/uL (ref 150–400)
RBC: 2.67 MIL/uL — ABNORMAL LOW (ref 3.87–5.11)
RDW: 14.6 % (ref 11.5–15.5)
WBC: 8.7 10*3/uL (ref 4.0–10.5)
nRBC: 0 % (ref 0.0–0.2)

## 2022-07-08 LAB — ALPHA-1-ANTITRYPSIN: A-1 Antitrypsin, Ser: 254 mg/dL — ABNORMAL HIGH (ref 101–187)

## 2022-07-08 LAB — COMPREHENSIVE METABOLIC PANEL
ALT: 308 U/L — ABNORMAL HIGH (ref 0–44)
AST: 257 U/L — ABNORMAL HIGH (ref 15–41)
Albumin: 2 g/dL — ABNORMAL LOW (ref 3.5–5.0)
Alkaline Phosphatase: 69 U/L (ref 38–126)
Anion gap: 8 (ref 5–15)
BUN: 20 mg/dL (ref 8–23)
CO2: 20 mmol/L — ABNORMAL LOW (ref 22–32)
Calcium: 8.5 mg/dL — ABNORMAL LOW (ref 8.9–10.3)
Chloride: 109 mmol/L (ref 98–111)
Creatinine, Ser: 0.59 mg/dL (ref 0.44–1.00)
GFR, Estimated: >60 mL/min (ref >60)
Glucose, Bld: 91 mg/dL (ref 70–99)
Potassium: 3.8 mmol/L (ref 3.5–5.1)
Sodium: 137 mmol/L (ref 135–145)
Total Bilirubin: 0.2 mg/dL — ABNORMAL LOW (ref 0.3–1.2)
Total Protein: 5.3 g/dL — ABNORMAL LOW (ref 6.5–8.1)

## 2022-07-08 LAB — MITOCHONDRIAL ANTIBODIES: Mitochondrial M2 Ab, IgG: <20 Units (ref 0.0–20.0)

## 2022-07-08 LAB — CERULOPLASMIN: Ceruloplasmin: 28.1 mg/dL (ref 19.0–39.0)

## 2022-07-08 MED ORDER — PREDNISONE 20 MG PO TABS
20.0000 mg | ORAL_TABLET | Freq: Every day | ORAL | 0 refills | Status: DC
  Filled 2022-07-08: qty 2, 2d supply, fill #0

## 2022-07-08 MED ORDER — ASPIRIN 81 MG PO CHEW: 81.0000 mg | CHEWABLE_TABLET | Freq: Every day | ORAL | Status: AC

## 2022-07-08 MED ORDER — BENZONATATE 100 MG PO CAPS
100.0000 mg | ORAL_CAPSULE | Freq: Three times a day (TID) | ORAL | 0 refills | Status: DC | PRN
  Filled 2022-07-08: qty 20, 7d supply, fill #0

## 2022-07-08 MED ORDER — MAGIC MOUTHWASH W/LIDOCAINE
5.0000 mL | Freq: Three times a day (TID) | ORAL | 0 refills | Status: AC | PRN
  Filled 2022-07-08: qty 120, 8d supply, fill #0

## 2022-07-08 MED ORDER — GUAIFENESIN-CODEINE 100-10 MG/5ML PO SOLN
10.0000 mL | Freq: Two times a day (BID) | ORAL | 0 refills | Status: DC | PRN
  Filled 2022-07-08: qty 120, 6d supply, fill #0

## 2022-07-08 MED ORDER — MAGIC MOUTHWASH W/LIDOCAINE
5.0000 mL | Freq: Three times a day (TID) | ORAL | Status: DC | PRN
  Filled 2022-07-08: qty 5

## 2022-07-08 MED FILL — Levothyroxine Sodium Tab 75 MCG: ORAL | 90 days supply | Qty: 90 | Fill #0 | Status: AC

## 2022-07-08 MED FILL — Lithium Carbonate Cap 300 MG: ORAL | 90 days supply | Qty: 90 | Fill #0 | Status: AC

## 2022-07-08 NOTE — Discharge Instructions (Signed)
Follow with Primary MD [name], MD in 7 days   Get CBC, CMP, 2 view Chest X ray checked  by Primary MD next visit.    Activity: As tolerated with Full fall precautions use walker/cane & assistance as needed   Disposition Home    Diet: Soft diet  On your next visit with your primary care physician please Get Medicines reviewed and adjusted.   Please request your Prim.MD to go over all Hospital Tests and Procedure/Radiological results at the follow up, please get all Hospital records sent to your Prim MD by signing hospital release before you go home.   If you experience worsening of your admission symptoms, develop shortness of breath, life threatening emergency, suicidal or homicidal thoughts you must seek medical attention immediately by calling 911 or calling your MD immediately  if symptoms less severe.  You Must read complete instructions/literature along with all the possible adverse reactions/side effects for all the Medicines you take and that have been prescribed to you. Take any new Medicines after you have completely understood and accpet all the possible adverse reactions/side effects.   Do not drive, operating heavy machinery, perform activities at heights, swimming or participation in water activities or provide baby sitting services if your were admitted for syncope or siezures until you have seen by Primary MD or a Neurologist and advised to do so again.  Do not drive when taking Pain medications.    Do not take more than prescribed Pain, Sleep and Anxiety Medications  Special Instructions: If you have smoked or chewed Tobacco  in the last 2 yrs please stop smoking, stop any regular Alcohol  and or any Recreational drug use.  Wear Seat belts while driving.   Please note  You were cared for by a hospitalist during your hospital stay. If you have any questions about your discharge medications or the care you received while you were in the hospital after you  are discharged, you can call the unit and asked to speak with the hospitalist on call if the hospitalist that took care of you is not available. Once you are discharged, your primary care physician will handle any further medical issues. Please note that NO REFILLS for any discharge medications will be authorized once you are discharged, as it is imperative that you return to your primary care physician (or establish a relationship with a primary care physician if you do not have one) for your aftercare needs so that they can reassess your need for medications and monitor your lab values.

## 2022-07-08 NOTE — Progress Notes (Signed)
Daily Rounding Note  07/08/2022, 11:39 AM  LOS: 6 days   SUBJECTIVE:   Took some lactulose yesterday in addition to her usual bowel regimen. Has not had a BM yet but she does not have any abdominal discomfort.   OBJECTIVE:         Vital signs in last 24 hours:    Temp:  [98.3 F (36.8 C)-98.7 F (37.1 C)] 98.4 F (36.9 C) (10/05 0800) Pulse Rate:  [61-67] 67 (10/05 0800) Resp:  [15-20] 17 (10/05 0800) BP: (94-147)/(51-88) 115/59 (10/05 0800) SpO2:  [90 %-100 %] 95 % (10/05 0800) Last BM Date : 06/30/22 Filed Weights   07/01/22 0934 07/05/22 0828  Weight: 42.6 kg 40.8 kg   General: Alert, appears comfortable Heart: Regular rate Chest: No increased WOB Abdomen: Soft Extremities: No BLE edema  Intake/Output from previous day: No intake/output data recorded.  Intake/Output this shift: Total I/O In: -  Out: 650 [Urine:650]  Lab Results: Recent Labs    07/06/22 0409 07/07/22 0458 07/08/22 0429  WBC 7.2 7.6 8.7  HGB 9.3* 9.1* 8.6*  HCT 28.6* 28.0* 26.8*  PLT 259 310 280   BMET Recent Labs    07/06/22 0409 07/07/22 0458 07/08/22 0429  NA 137 139 137  K 4.3 4.2 3.8  CL 111 110 109  CO2 19* 22 20*  GLUCOSE 125* 100* 91  BUN '21 21 20  '$ CREATININE 0.73 0.77 0.59  CALCIUM 8.8* 9.0 8.5*   LFT Recent Labs    07/06/22 0409 07/07/22 0458 07/07/22 1121 07/08/22 0429  PROT 5.8* 5.4* 5.0* 5.3*  ALBUMIN 2.2* 2.0*  --  2.0*  AST 272* 285*  --  257*  ALT 245* 276*  --  308*  ALKPHOS 78 73  --  69  BILITOT 0.1* 0.2*  --  0.2*   PT/INR No results for input(s): "LABPROT", "INR" in the last 72 hours.  Hepatitis Panel Recent Labs    07/07/22 0458  HEPBSAG NON REACTIVE  HCVAB NON REACTIVE  HEPAIGM NON REACTIVE  HEPBIGM NON REACTIVE    Studies/Results: IR THORACENTESIS ASP PLEURAL SPACE W/IMG GUIDE  Result Date: 07/07/2022 INDICATION: Patient with a history of heart failure presents to the  hospital with temporal headaches, dry cough and chills. Image findings showed right upper lobe pneumonia and pleural effusions. Interventional radiology asked to perform a diagnostic and therapeutic thoracentesis. EXAM: ULTRASOUND GUIDED THORACENTESIS MEDICATIONS: 1% lidocaine 10 mL COMPLICATIONS: None immediate. PROCEDURE: An ultrasound guided thoracentesis was thoroughly discussed with the patient and questions answered. The benefits, risks, alternatives and complications were also discussed. The patient understands and wishes to proceed with the procedure. Written consent was obtained. Ultrasound was performed to localize and mark an adequate pocket of fluid in the right chest. The area was then prepped and draped in the normal sterile fashion. 1% Lidocaine was used for local anesthesia. Under ultrasound guidance a 6 Fr Safe-T-Centesis catheter was introduced. Thoracentesis was performed. The catheter was removed and a dressing applied. FINDINGS: A total of approximately 200 mL of clear yellow fluid was removed. Samples were sent to the laboratory as requested by the clinical team. IMPRESSION: Successful ultrasound guided right thoracentesis yielding 200 mL of pleural fluid. Read by: Soyla Dryer, NP Electronically Signed   By: Sandi Mariscal M.D.   On: 07/07/2022 10:48   DG Chest 1 View  Result Date: 07/07/2022 CLINICAL DATA:  Status post right thoracentesis. EXAM: CHEST  1 VIEW COMPARISON:  Chest x-ray 07/04/2022 FINDINGS: The cardiac silhouette, mediastinal and hilar contours are within normal limits and stable. Persistent airspace process in the right lung. No residual pleural effusion and no evidence of postprocedural pneumothorax. IMPRESSION: 1. No residual pleural effusion or postprocedural pneumothorax. 2. Persistent right lung airspace process. Electronically Signed   By: Marijo Sanes M.D.   On: 07/07/2022 10:33     Scheduled Meds:  aspirin  81 mg Oral Daily   benzonatate  100 mg Oral TID    cyanocobalamin  1,000 mcg Oral QHS   enoxaparin (LOVENOX) injection  30 mg Subcutaneous Q24H   fesoterodine  8 mg Oral Daily   fluticasone  2 spray Each Nare Daily   lactulose  20 g Oral TID   levothyroxine  75 mcg Oral Q0600   linaclotide  72 mcg Oral BID   lithium carbonate  300 mg Oral QHS   loratadine  10 mg Oral Daily   memantine  10 mg Oral BID   mirtazapine  7.5 mg Oral QHS   naloxegol oxalate  25 mg Oral Daily   nortriptyline  75 mg Oral QHS   pantoprazole  40 mg Oral BID AC   PARoxetine  20 mg Oral QHS   predniSONE  20 mg Oral Q breakfast   primidone  100 mg Oral q AM   primidone  50 mg Oral QHS   vitamin B-6  500 mg Oral BID   rosuvastatin  20 mg Oral Daily   senna-docusate  2 tablet Oral BID   Continuous Infusions:   PRN Meds:.acetaminophen **OR** acetaminophen, albuterol, guaiFENesin-codeine, guaiFENesin-dextromethorphan, LORazepam, LORazepam, magic mouthwash w/lidocaine, ondansetron (ZOFRAN) IV, polyethylene glycol, traMADol  ASSESMENT:     Acute on chronic elevation of transaminases. Appears to be stable. INR nml. Ab U/S showed normal liver and no CBD dilation. Acute hepatitis panel, IgG, ASMA, alpha 1 antitrypsin, ceruloplasmin were normal. AMA still pending. Likely due to infection or DILI. Suspect this should improve after she completes her course of antibiotics and recovers from her infection.  CAP.  Progressive multi lobar bilateral bronchopneumonia. Receiving antibiotics and steroids  Anemia.  Appears to be stable.  IBS-C.  Continue Movantik and Linzess. Plan is for her to use Miralax and suppositories PRN to help induce BMs.   PLAN   Trend LFTs, which can be followed as an outpatient  Patient has outpatient GI follow up with Dr. Silverio Decamp on 08/10/22  Sharyn Creamer  07/08/2022, 11:39 AM Phone [phone]

## 2022-07-08 NOTE — TOC Transition Note (Signed)
Transition of Care [facility]) - CM/SW Discharge Note   Patient Details  Name: Meghan Oliver MRN: [id number] Date of Birth: 11/26/1940  Transition of Care [facility]) CM/SW Contact:  Verdell Carmine, RN Phone Number: 07/08/2022, 9:38 AM   Clinical Narrative:     Husband requesting a wheelchair, ordered via [employer] to be delivered to room. Patient will be DC back to [facility] with Home Health and DME. Husband will transport patient.     Barriers to Discharge: Continued Medical Work up   Patient Goals and CMS Choice Patient states their goals for this hospitalization and ongoing recovery are:: return home CMS Medicare.gov Compare Post Acute Care list provided to:: Patient Represenative (must comment)    Discharge Placement             [facility]           Discharge Plan and Services   Discharge Planning Services: CM Consult Post Acute Care Choice: Home Health          DME Arranged: Bedside commode DME Agency: [employer] Date DME Agency Contacted: 07/08/22 Time DME Agency Contacted: [phone] Representative spoke with at DME Agency: [place]: RN, OT, PT, Nurse's Aide, Speech Therapy   Date [place]: 07/07/22 Time [place]: 70 Representative spoke with at [place]: [place] Determinants of Health ([place]) Interventions     Readmission Risk Interventions     No data to display

## 2022-07-08 NOTE — Discharge Summary (Signed)
Physician Discharge Summary  Meghan Oliver IAX:[id number] DOB: 1941-06-01 DOA: 07/01/2022  PCP: [name], MD  Admit date: 07/01/2022 Discharge date: 07/08/2022  Admitted From:Home Disposition:  Home   Recommendations for Outpatient Follow-up:  Follow up with PCP in 1-2 weeks To follow-up with pulmonary as an outpatient regarding repeat CT chest to follow-up on her pneumonia and pleural effusion, appointment has been arranged with Dr. Kimber Relic Patient to follow-up with GI as an outpatient Dr. Silverio Decamp, regarding repeat LFTs to ensure they are stable Referral has been made to rheumatology  Referral has been made neurology migraine specialist Dr. Currie Paris He is following the final results of cytology on her pleural effusion  Home Health:YES Equipment/Devices: Wheelchair  Discharge Condition:Stable CODE STATUS:FULL, Diet recommendation: Heart Healthy   Brief/Interim Summary:  81 y.o. female with medical history significant of HTN, HLD, hypothyroidism, OSA, GERD, sinus node dysfunction s/p PPM in 03/2018, bipolar and depression who presented to ED with 3 day history of cough, chills and a severe headache. New intermittent episodes of headache on left temple x 3 days prior to hospitalization of note she had similar headaches about 2 months ago which lasted for a few days.  No photophobia, no jaw claudication, no problems chewing food.  No temporal tenderness.  In the hospital she was diagnosed with pneumonia and admitted to the hospital.  Neurology was consulted for headaches.  Her sed rate was noted to be elevated, vascular surgery were consulted for temporal artery biopsy which was obtained, biopsy was negative for giant cell arteritis, and steroids were tapered, please see discussion below for more details.     Community acquired pneumonia  Right pleural effusion -Pulmonary  input greatly appreciated, CT findings more suspicious for infectious etiology .  As well she was noted to have right  pleural effusion, there was some concern for vasculitis giving elevated LFTs, but her autoimmune work-up was negative, she was seen by pulmonary critical care physician, IR had thoracentesis done with 200 cc drained, work-up was significant for borderline exudate, she was treated with IV Rocephin and azithromycin during hospital stay, as discussed with pulmonary there is no need for further antibiotics on discharge -Pulmonary will arrange for outpatient follow-up and repeat CT chest.     Intermittent episodes of left temporal headache  - this has been ongoing on a intermittent basis for a few days, 1 such episode happened a few months ago but resolved by itself, she had MRI brain, CTA head, vascular ultrasound of the temporal artery which were all unremarkable. -Peripheral artery biopsy by vascular surgery 10/2 is negative , patient has been started on IV Solu-Medrol then transition to prednisone, upon further evaluation and discussion with neurology, pulmonary, there was low concern for pain cell arteritis, and recommendation has been made for rapid prednisone taper, so she will discontinue her prednisone 20 mg over the next couple days. -Overall his sed rate is elevated, her rheumatoid factor mildly elevated at 18, alpha-1 antitrypsin as well mildly elevated, so recommendation has been made to follow-up with otology as an outpatient. -Her presentation was felt to be secondary to headache, so ambulatory referral to neurology has been made per in-house neurology recommendation   Asymptomatic transaminitis.   -GI input greatly appreciated, it was felt to be secondary to acute infection, no acute finding and ultrasound right upper quadrant, her antimitochondrial antibody, and  Anti-smooth muscles are negative.  Hepatitis panel is negative as well, GI will arrange for outpatient follow-up regarding LFT monitoring, LFTs remains  mildly elevated at time of discharge. -Tylenol and statin were discontinued at  time of discharge   Elevated troponin - She has no chest pain or ekg changes, troponin trend flat and not an ACS pattern, continue aspirin and statin for secondary prevention Case discussed with her cardiologist Dr. Loralie Champagne, no further work-up for now.   Macrocytic anemia - Hb nadir of 9.7 after hip surgery in August, iron studies inconclusive, B12 stable, continue to monitor no signs of infection.  Some drop due to hemodilution.  Continue PPI twice daily.  .   Bipolar/depression - History of ECT x 30 in past, Continue home medication of paxil, pamelor, remeron and ativan PRN and lithium, stable lithium level.   Sinus node dysfunction (HCC) - S/p [facility] PPM in 03/2018.    Memory loss - Continue Namenda, at risk for delirium and aspiration.  Discussed with her primary nurse and husband on 07/02/2022.   Hyperlipidemia - Continue Crestor.    Hypothyroidism - TSH wnl in 03/2022, Continue home synthroid.    Hyponatremia - likely SIADH, hold IVF, much improved, monitor.   Chronic constipation -resume home medications on discharge     GERD - Continue PPI.    OSA (obstructive sleep apnea) - Not on cpap, non compliant    Hypomagnesemia.  Replaced.        Discharge Diagnoses:  Principal Problem:   Community acquired pneumonia Active Problems:   Temporal headache   Elevated troponin   Macrocytic anemia   bipolar/depression   Sinus node dysfunction (HCC)   Memory loss   Hyperlipidemia   Hypothyroidism   Chronic constipation   GERD   OSA (obstructive sleep apnea)   Elevated erythrocyte sedimentation rate   Elevated LFTs    Discharge Instructions  Discharge Instructions     Ambulatory referral to Neurology   Complete by: As directed    An appointment is requested in approximately: 4 weeks   Ambulatory referral to Rheumatology   Complete by: As directed    Diet - low sodium heart healthy   Complete by: As directed    Discharge instructions   Complete by: As  directed    Follow with Primary MD [name], MD in 7 days   Get CBC, CMP, 2 view Chest X ray checked  by Primary MD next visit.    Activity: As tolerated with Full fall precautions use walker/cane & assistance as needed   Disposition Home    Diet: Soft diet  On your next visit with your primary care physician please Get Medicines reviewed and adjusted.   Please request your Prim.MD to go over all Hospital Tests and Procedure/Radiological results at the follow up, please get all Hospital records sent to your Prim MD by signing hospital release before you go home.   If you experience worsening of your admission symptoms, develop shortness of breath, life threatening emergency, suicidal or homicidal thoughts you must seek medical attention immediately by calling 911 or calling your MD immediately  if symptoms less severe.  You Must read complete instructions/literature along with all the possible adverse reactions/side effects for all the Medicines you take and that have been prescribed to you. Take any new Medicines after you have completely understood and accpet all the possible adverse reactions/side effects.   Do not drive, operating heavy machinery, perform activities at heights, swimming or participation in water activities or provide baby sitting services if your were admitted for syncope or siezures until you have seen by  Primary MD or a Neurologist and advised to do so again.  Do not drive when taking Pain medications.    Do not take more than prescribed Pain, Sleep and Anxiety Medications  Special Instructions: If you have smoked or chewed Tobacco  in the last 2 yrs please stop smoking, stop any regular Alcohol  and or any Recreational drug use.  Wear Seat belts while driving.   Please note  You were cared for by a hospitalist during your hospital stay. If you have any questions about your discharge medications or the care you received while you were in the  hospital after you are discharged, you can call the unit and asked to speak with the hospitalist on call if the hospitalist that took care of you is not available. Once you are discharged, your primary care physician will handle any further medical issues. Please note that NO REFILLS for any discharge medications will be authorized once you are discharged, as it is imperative that you return to your primary care physician (or establish a relationship with a primary care physician if you do not have one) for your aftercare needs so that they can reassess your need for medications and monitor your lab values.   Increase activity slowly   Complete by: As directed    No wound care   Complete by: As directed       Allergies as of 07/08/2022       Reactions   Propranolol Other (See Comments)   Low blood pressure   Brexpiprazole Other (See Comments)   Aphasia and catatonia        Medication List     STOP taking these medications    acetaminophen 500 MG tablet Commonly known as: TYLENOL   rosuvastatin 40 MG tablet Commonly known as: CRESTOR       TAKE these medications    aspirin 81 MG chewable tablet Chew 1 tablet (81 mg total) by mouth daily. Start taking on: July 09, 2022 What changed:  when to take this additional instructions   benzonatate 100 MG capsule Commonly known as: TESSALON Take 1 capsule (100 mg total) by mouth 3 (three) times daily as needed for cough.   CALCIUM 600/VITAMIN D PO Take 1 tablet by mouth 2 (two) times daily.   cetirizine 10 MG tablet Commonly known as: ZYRTEC Take 10 mg by mouth daily as needed for allergies.   cyanocobalamin 1000 MCG tablet Commonly known as: VITAMIN B12 Take 1,000 mcg by mouth at bedtime.   dexlansoprazole 60 MG capsule Commonly known as: DEXILANT Take 1 capsule by mouth daily What changed:  how much to take how to take this when to take this   fesoterodine 8 MG Tb24 tablet Commonly known as: TOVIAZ Take 1  tablet (8 mg total) by mouth daily.   guaiFENesin-codeine 100-10 MG/5ML syrup Take 10 mLs by mouth every 12 (twelve) hours as needed for cough.   Krill Oil 500 MG Caps Take 500 mg by mouth daily.   Lavender Oil 80 MG Caps Take 160 mg by mouth at bedtime.   levothyroxine 75 MCG tablet Commonly known as: SYNTHROID Take 1 tablet (75 mcg total) by mouth daily.   Linzess 72 MCG capsule Generic drug: linaclotide Take 1 capsule (72 mcg total) by mouth in the morning and at bedtime.   lithium carbonate 300 MG capsule Take 1 capsule (300 mg total) by mouth at bedtime.   LORazepam 0.5 MG tablet Commonly known as: ATIVAN TAKE 1/2 TO  1 TABLET BY MOUTH EVERY NIGHT AT BEDTIME AND 1 TABLET EVERY 8 HOURS AS NEEDED FOR ANXIETY What changed:  how much to take how to take this when to take this   magic mouthwash w/lidocaine Soln Take 5 mLs by mouth 3 (three) times daily as needed for mouth pain.   memantine 10 MG tablet Commonly known as: NAMENDA Take 1 tablet (10 mg total) by mouth 2 (two) times daily.   methocarbamol 500 MG tablet Commonly known as: ROBAXIN Take 1 tablet (500 mg total) by mouth every 6 (six) hours as needed for muscle spasms.   mirtazapine 7.5 MG tablet Commonly known as: REMERON Take 1 tablet (7.5 mg total) by mouth at bedtime.   PRESERVISION AREDS 2 PO Take 1 capsule by mouth in the morning and at bedtime.   multivitamin with minerals tablet Take 1 tablet by mouth daily.   naloxegol oxalate 12.5 MG Tabs tablet Commonly known as: Movantik Take 2 tablets (25 mg total) by mouth daily. Start with 1 tablet daily and if tolerating well for a week increase to 2 tablets daily   nortriptyline 75 MG capsule Commonly known as: PAMELOR Take 1 capsule (75 mg total) by mouth at bedtime.   ondansetron 4 MG disintegrating tablet Commonly known as: ZOFRAN-ODT Place 1 tablet under the tongue every 6 hours as needed. (Place 1 tablet every 6 hours by translingual route as  needed.)   PARoxetine 20 MG tablet Commonly known as: PAXIL Take 1 tablet (20 mg total) by mouth at bedtime.   polyethylene glycol 17 g packet Commonly known as: MIRALAX / GLYCOLAX Take 17 g by mouth daily as needed for mild constipation.   predniSONE 20 MG tablet Commonly known as: DELTASONE Take 1 tablet (20 mg total) by mouth daily with breakfast. Start taking on: July 09, 2022   primidone 50 MG tablet Commonly known as: MYSOLINE TAKE 2 TABLETS BY MOUTH EVERY MORNING AND 1 TABLET EVERY EVENING   primidone 50 MG tablet Commonly known as: MYSOLINE Take 2 tablets by mouth every morning and take 1 tablet by mouth every evening   sennosides-docusate sodium 8.6-50 MG tablet Commonly known as: SENOKOT-S Take 2 tablets by mouth at bedtime.   traMADol 50 MG tablet Commonly known as: ULTRAM Take 1-2 tablets (50-100 mg total) by mouth every 6 (six) hours as needed for moderate pain or severe pain.   valACYclovir 1000 MG tablet Commonly known as: VALTREX Take 2 tablets by mouth at onset, then 2 tablets 12 hours later What changed:  how much to take how to take this when to take this   vitamin B-6 500 MG tablet Take 500 mg by mouth 2 (two) times daily.   Vitamin D3 50 MCG (2000 UT) Tabs Take 2,000 Units by mouth in the morning.               Durable Medical Equipment  (From admission, onward)           Start     Ordered   07/08/22 [phone]  For home use only DME lightweight manual wheelchair with seat cushion  Once       Comments: Patient suffers from Weakness which impairs their ability to perform daily activities like toileting in the home.  A walker will not resolve  issue with performing activities of daily living. A wheelchair will allow patient to safely perform daily activities. Patient is not able to propel themselves in the home using a standard weight wheelchair due to  general weakness. Patient can self propel in the lightweight wheelchair. Length of need  Lifetime. Patient needs a small wheelchair ( 16") Accessories: elevating leg rests (ELRs), wheel locks, extensions and anti-tippers.   07/08/22 5631            Follow-up Information     [facility] Follow up.   Why: THey will call 24-48 hours to set up services Contact information: [place]        Marshell Garfinkel, MD Follow up.   Specialty: Pulmonary Disease Contact information: [place] [address] [phone]         Mauri Pole, MD Follow up.   Specialty: Gastroenterology Contact information: [place] [address] [phone]         Maisie Fus, MD .   Specialty: Neurology               Allergies  Allergen Reactions   Propranolol Other (See Comments)    Low blood pressure   Brexpiprazole Other (See Comments)    Aphasia and catatonia    Consultations: PCCM GI Neurology Vascular surgery IR   Procedures -Left temporal artery biopsy by vascular surgery -Right Thoracentesis by IR   Procedures/Studies: IR THORACENTESIS ASP PLEURAL SPACE W/IMG GUIDE  Result Date: 07/07/2022 INDICATION: Patient with a history of heart failure presents to the hospital with temporal headaches, dry cough and chills. Image findings showed right upper lobe pneumonia and pleural effusions. Interventional radiology asked to perform a diagnostic and therapeutic thoracentesis. EXAM: ULTRASOUND GUIDED THORACENTESIS MEDICATIONS: 1% lidocaine 10 mL COMPLICATIONS: None immediate. PROCEDURE: An ultrasound guided thoracentesis was thoroughly discussed with the patient and questions answered. The benefits, risks, alternatives and complications were also discussed. The patient understands and wishes to proceed with the procedure. Written consent was obtained. Ultrasound was performed to localize and mark an adequate pocket of fluid in the  right chest. The area was then prepped and draped in the normal sterile fashion. 1% Lidocaine was used for local anesthesia. Under ultrasound guidance a 6 Fr Safe-T-Centesis catheter was introduced. Thoracentesis was performed. The catheter was removed and a dressing applied. FINDINGS: A total of approximately 200 mL of clear yellow fluid was removed. Samples were sent to the laboratory as requested by the clinical team. IMPRESSION: Successful ultrasound guided right thoracentesis yielding 200 mL of pleural fluid. Read by: Soyla Dryer, NP Electronically Signed   By: Sandi Mariscal M.D.   On: 07/07/2022 10:48   DG Chest 1 View  Result Date: 07/07/2022 CLINICAL DATA:  Status post right thoracentesis. EXAM: CHEST  1 VIEW COMPARISON:  Chest x-ray 07/04/2022 FINDINGS: The cardiac silhouette, mediastinal and hilar contours are within normal limits and stable. Persistent airspace process in the right lung. No residual pleural effusion and no evidence of postprocedural pneumothorax. IMPRESSION: 1. No residual pleural effusion or postprocedural pneumothorax. 2. Persistent right lung airspace process. Electronically Signed   By: Marijo Sanes M.D.   On: 07/07/2022 10:33   CT Chest High Resolution  Result Date: 07/06/2022 CLINICAL DATA:  81 year old female with history of complicated pneumonia. Evaluate for interstitial lung disease. EXAM: CT CHEST WITHOUT CONTRAST TECHNIQUE: Multidetector CT imaging of the chest was performed following the standard protocol without intravenous contrast. High resolution imaging of the lungs, as well as inspiratory and expiratory imaging, was performed. RADIATION DOSE REDUCTION: This exam was performed according to the departmental dose-optimization program which includes automated  exposure control, adjustment of the mA and/or kV according to patient size and/or use of iterative reconstruction technique. COMPARISON:  Cardiac CT 05/06/2017.  Chest CT 09/05/2013. FINDINGS:  Cardiovascular: Heart size is borderline enlarged. There is no significant pericardial fluid, thickening or pericardial calcification. There is aortic atherosclerosis, as well as atherosclerosis of the great vessels of the mediastinum and the coronary arteries, including calcified atherosclerotic plaque in the left main, left anterior descending, left circumflex and right coronary arteries. Calcifications of the aortic valve. Left-sided pacemaker/AICD with lead tips terminating in the right atrium and right ventricle. Mediastinum/Nodes: No pathologically enlarged mediastinal or hilar lymph nodes. Small hiatal hernia. No axillary lymphadenopathy. Lungs/Pleura: Moderate right and small left pleural effusions lying dependently. There are patchy randomly distributed areas of airspace consolidation, ground-glass attenuation, inter and intra lobular septal thickening and some mild architectural distortion scattered throughout the lungs (right greater than left), most severe in the right upper and right lower lobes, likely to reflect severe multilobar bilateral bronchopneumonia. No definite suspicious appearing pulmonary nodules or masses are noted. Inspiratory and expiratory imaging is unremarkable. Upper Abdomen: Aortic atherosclerosis. Incompletely imaged low-attenuation lesion in the left kidney measuring at least 2.9 cm in diameter, incompletely characterized on today's noncontrast CT examination, but statistically likely a cyst (no imaging follow-up is recommended). Musculoskeletal: There are no aggressive appearing lytic or blastic lesions noted in the visualized portions of the skeleton. Bilateral breast implants are incidentally noted. IMPRESSION: 1. The appearance of the chest is most compatible with acute multilobar bilateral bronchopneumonia (right greater than left) with moderate right and small left parapneumonic pleural effusions. If there is clinical concern for interstitial lung disease after resolution of  the patient's acute illness, repeat high-resolution chest CT could be obtained in 6-12 months to assess for temporal changes in the appearance of the lung parenchyma. 2. Aortic atherosclerosis, in addition to left main and three-vessel coronary artery disease. 3. There are calcifications of the aortic valve. Echocardiographic correlation for evaluation of potential valvular dysfunction may be warranted if clinically indicated. Aortic Atherosclerosis (ICD10-I70.0). Electronically Signed   By: Vinnie Langton M.D.   On: 07/06/2022 06:42   CUP PACEART REMOTE DEVICE CHECK  Result Date: 07/05/2022 Scheduled remote reviewed. Normal device function.  Next remote 91 days. LA  DG Chest Port 1 View  Result Date: 07/04/2022 CLINICAL DATA:  Headache.  Follow-up study. EXAM: PORTABLE CHEST 1 VIEW COMPARISON:  07/02/2022 and older exams. FINDINGS: Interval worsening of lung aeration. Right upper lobe airspace opacity has mildly increased. There is increased opacity in the right perihilar to lower lung. Left lung remains clear. No convincing pleural effusion.  No pneumothorax. Normal size cardiac silhouette and stable left anterior chest wall pacemaker. IMPRESSION: 1. Worsened right lung aeration compared to the most recent prior study. Right lung airspace opacities have increased as detailed consistent with multifocal pneumonia. Electronically Signed   By: Lajean Manes M.D.   On: 07/04/2022 10:14   US Abdomen Limited RUQ (LIVER/GB)  Result Date: 07/04/2022 CLINICAL DATA:  Transaminitis. EXAM: ULTRASOUND ABDOMEN LIMITED RIGHT UPPER QUADRANT COMPARISON:  CT abdomen pelvis, 08/09/2017. FINDINGS: Gallbladder: Dense layering material consistent with small stones or dense sludge. No wall thickening. No pericholecystic fluid. No sonographic Murphy's sign. Common bile duct: Diameter: 5 mm Liver: No focal lesion identified. Within normal limits in parenchymal echogenicity. Portal vein is patent on color Doppler imaging with  normal direction of blood flow towards the liver. Other: Right pleural effusion. IMPRESSION: 1. No acute findings. 2. Dense material  layers in the gallbladder consistent with dense sludge or small stones. 3. Normal appearance of the liver.  No bile duct dilation. Electronically Signed   By: Lajean Manes M.D.   On: 07/04/2022 10:12   DG Swallowing Func-Speech Pathology  Result Date: 07/02/2022 Table formatting from the original result was not included. Objective Swallowing Evaluation: Type of Study: MBS-Modified Barium Swallow Study  Patient Details Name: RUHAMA LEHEW MRN: [id number] Date of Birth: 21-Nov-1940 Today's Date: 07/02/2022 Time: SLP Start Time (ACUTE ONLY): 2836 -SLP Stop Time (ACUTE ONLY): 6294 SLP Time Calculation (min) (ACUTE ONLY): 17 min Past Medical History: Past Medical History: Diagnosis Date  Anxiety   Arthritis   "hips, spine" (03/17/2018)  Bipolar II disorder (HCC)   CHF (congestive heart failure) (HCC)   Chronic bronchitis (HCC)   Chronic lower back pain   Chronic right hip pain   CKD (chronic kidney disease), stage II   Coronary artery disease   stent x1  Esophagitis, erosive   GAD (generalized anxiety disorder)   GERD (gastroesophageal reflux disease)   Headache   "maybe monthly" (03/17/2018))  Heart murmur, systolic   History of adenomatous polyp of colon   08-04-2016  tubular adenoma  History of blood transfusion 12/2017  "related to vascular hematoma"  History of electroconvulsive therapy   at [facility]--  started 04-15-2015 to 11-17-2016  total greater than 40 times  History of hiatal hernia   Hyperlipidemia   Hypertension   Hypothyroidism   Internal carotid artery stenosis, bilateral   per last duplex 05-01-2014  bilateral ICA 40-59%  Major depression, chronic   ECT treatments extensive and multiple started 07/ 2016  Memory loss   "both short and long-term; needs frequent reminders to follow instrucitons" (05/16/2017)  Migraines   "none in years" (03/17/2018)  OSA (obstructive sleep apnea)    per study 06/ 2012 moderate OSA  ; "refuses to wear masks" (03/17/2018)  Osteoporosis   Poor historian   due to short term memory loss  Presence of permanent cardiac pacemaker 03/17/2018  Pulmonary nodule   monitored by pcp  S/P placement of cardiac pacemaker 03/17/18 [facility]  03/18/2018  Short-term memory loss   Sick sinus syndrome ([facility])  Past Surgical History: Past Surgical History: Procedure Laterality Date  APPENDECTOMY  1971  AUGMENTATION MAMMAPLASTY Bilateral   CARDIOVASCULAR STRESS TEST  01-30-2015  dr Aundra Dubin  Low risk nuclear study w/ no evidence ischemia or infarction/  normal LV funciton and wall motion , 76%  COLONOSCOPY W/ BIOPSIES AND POLYPECTOMY  "multiple"  COLONOSCOPY WITH ESOPHAGOGASTRODUODENOSCOPY (EGD)  last one 08-04-2016  CORONARY ANGIOPLASTY WITH STENT PLACEMENT  05/16/2017  "LAD"  CORONARY STENT INTERVENTION N/A 05/16/2017  Procedure: CORONARY STENT INTERVENTION;  Surgeon: Burnell Blanks, MD;  Location: [facility];  Service: Cardiovascular;  Laterality: N/A;  ESOPHAGOGASTRODUODENOSCOPY  02-26-04  ESOPHAGOGASTRODUODENOSCOPY  "multiple"  INSERT / REPLACE / REMOVE PACEMAKER  03/17/2018  LEFT HEART CATH AND CORONARY ANGIOGRAPHY N/A 05/16/2017  Procedure: LEFT HEART CATH AND CORONARY ANGIOGRAPHY;  Surgeon: Larey Dresser, MD;  Location: [facility];  Service: Cardiovascular;  Laterality: N/A;  OVARIAN CYST SURGERY  1970s  Laparotomy   PACEMAKER IMPLANT N/A 03/17/2018  Procedure: PACEMAKER IMPLANT;  Surgeon: Evans Lance, MD;  Location: [facility];  Service: Cardiovascular;  Laterality: N/A;  PORT-A-CATH PLACEMENT  05/31/2016; 2018  "@ [facility]; for ECT series"; "@ [facility] also"  PORT-A-CATH REMOVAL N/A 03/11/2017  Procedure: REMOVAL PORT-A-CATH;  Surgeon: Jackolyn Confer, MD;  Location: [facility];  Service: General;  Laterality: N/A;  PORTA CATH REMOVAL  03/17/2018  PORTA CATH REMOVAL  03/17/2018  Procedure: PORTA CATH REMOVAL;  Surgeon: Evans Lance, MD;   Location: [facility];  Service: Cardiovascular;;  TOTAL HIP ARTHROPLASTY Right 05/18/2022  Procedure: TOTAL HIP ARTHROPLASTY ANTERIOR APPROACH;  Surgeon: Paralee Cancel, MD;  Location: [facility];  Service: Orthopedics;  Laterality: Right;  TRANSTHORACIC ECHOCARDIOGRAM  09/05/2013  dr Aundra Dubin  mild LVH, ef 76-72%, grade 1 diastolic dysfunction/  very mild AV stenosis with mild AR/  trivial MR and PT/ mild to moderate LAE/ mild TR/ mild pulmonary hypertension with PA peak pressure 52mHg  TUBAL LIGATION   HPI: 81y.o. female who presented to ED with 3 day history of cough, chills and a severe headache. New intermittent episodes of headache on left temple x 3 days prior to hospitalization of note she had similar headaches about 2 months ago which lasted for a few days.  In the hospital she was diagnosed with pneumonia and admitted to the hospital.  Neurology was consulted for headaches. Pt with medical history significant of HTN, HLD, hypothyroidism, OSA, GERD, sinus node dysfunction s/p PPM in 03/2018, bipolar and depression. Caregiver reports hx esophageal dilation x2.  Subjective: awake, cooperative but also confused, asking some repetitive questions  Recommendations for follow up therapy are one component of a multi-disciplinary discharge planning process, led by the attending physician.  Recommendations may be updated based on patient status, additional functional criteria and insurance authorization. Assessment / Plan / Recommendation   07/02/2022   2:00 PM Clinical Impressions Clinical Impression Pt has generally functional oropharyngeal swallowing, but she does appear to have a prominent CP that might partially interfere with bolus flow but leaving only trace residue within the UES or just above. No aspiration occurs and coughing throughout MBS is not related to airway protection. Of note, the barium tablet stayed briefly in the distal esophagus but did appear to clear with an additional liquid wash (MD not  present to confirm). Recommend continuing regular solids and thin liquids as tolerated. SLP Visit Diagnosis Dysphagia, unspecified (R13.10) Impact on safety and function Mild aspiration risk     07/02/2022   2:00 PM Treatment Recommendations Treatment Recommendations No treatment recommended at this time     07/02/2022   2:00 PM Prognosis Prognosis for Safe Diet Advancement Good   07/02/2022   2:00 PM Diet Recommendations SLP Diet Recommendations Regular solids;Thin liquid Liquid Administration via Cup;Straw Medication Administration Whole meds with liquid Compensations Slow rate;Small sips/bites;Minimize environmental distractions Postural Changes Seated upright at 90 degrees;Remain semi-upright after after feeds/meals (Comment)     07/02/2022   2:00 PM Other Recommendations Oral Care Recommendations Oral care BID Follow Up Recommendations No SLP follow up Assistance recommended at discharge PRN Functional Status Assessment Patient has not had a recent decline in their functional status   07/02/2022  11:50 AM Frequency and Duration  Speech Therapy Frequency (ACUTE ONLY) --     07/02/2022   2:00 PM [facility]   07/02/2022   2:00 PM [facility]   07/02/2022   2:00 PM Cervical Esophageal Phase  Cervical Esophageal Phase Impaired Thin Cup Reduced cricopharyngeal relaxation;Prominent cricopharyngeal segment Thin Straw Reduced cricopharyngeal relaxation;Prominent cricopharyngeal segment Puree Reduced cricopharyngeal relaxation;Prominent cricopharyngeal segment Regular Reduced cricopharyngeal relaxation;Prominent cricopharyngeal segment Pill Reduced cricopharyngeal relaxation;Prominent cricopharyngeal segment LOsie Bond, M.A. CNorth Cape MayOffice [phone] chat preferred 07/02/2022,  3:12 PM                     VAS [name] TEMPORAL ARTERY BILATERAL  Result Date: 07/02/2022  TEMPORAL ARTERY REPORT Patient Name:  SOLE LENGACHER  Date of Exam:   07/02/2022  Medical Rec #: [id number]       Accession #:    [id number] Date of Birth: November 16, 1940        Patient Gender: F Patient Age:   81 years Exam Location:  [facility] Procedure:      VAS [name] Wall BILATERAL Referring Phys: MCNEILL KIRKPATRICK --------------------------------------------------------------------------------  Indications: Left sided temporal headache x3 days. High Risk Factors: Age > 72 yrs and female.  Performing Technologist: Archie Patten RVS  Examination Guidelines: Patient in reclined position. 2D, color and spectral doppler sampling in the temporal artery along the hairline and temple in the longitudinal plane. 2D images along the hairline and temple in the transverse plane. Exam is bilateral.  Summary: Absence of a "halo" sign in the bilateral temporal artery, although not definitive, makes a diagnosis of temporal arteritis unlikely.  *See table(s) above for measurements and observations.  Electronically signed by Antony Contras MD on 07/02/2022 at 2:26:10 PM.   Final    MR Brain W and Wo Contrast  Result Date: 07/02/2022 CLINICAL DATA:  Headache. EXAM: MRI HEAD WITHOUT AND WITH CONTRAST TECHNIQUE: Multiplanar, multiecho pulse sequences of the brain and surrounding structures were obtained without and with intravenous contrast. CONTRAST:  6 mL Vueway COMPARISON:  Head CT, CTA, and CTP 07/01/2022.  Head MRI 11/12/2015. FINDINGS: Postcontrast sequences are mildly motion degraded. Brain: There is no evidence of an acute infarct, intracranial hemorrhage, mass, midline shift, or extra-axial fluid collection. The ventricles and sulci are within normal limits for age. T2 hyperintensities in the cerebral white matter and pons have progressed from 2017 and are nonspecific but compatible with mild chronic small vessel ischemic disease. No abnormal enhancement is identified. Vascular: Major intracranial vascular flow voids are preserved. Skull and upper cervical spine: Unremarkable bone marrow  signal. Sinuses/Orbits: Bilateral cataract extraction. Mucosal thickening and small volume fluid in the right maxillary sinus. Trace left mastoid fluid. Other: None. IMPRESSION: 1. No acute intracranial abnormality. 2. Mild chronic small vessel ischemic disease. Electronically Signed   By: Logan Bores M.D.   On: 07/02/2022 13:37   DG Chest Port 1 View  Result Date: 07/02/2022 CLINICAL DATA:  Headache, cough, pneumonia. EXAM: PORTABLE CHEST 1 VIEW COMPARISON:  07/01/2022 FINDINGS: There is a left chest wall pacer device with lead in the right atrial appendage and right ventricle. Normal heart size. Aortic atherosclerotic calcifications. Persistent airspace disease within the right upper lobe is not significantly changed from previous exam. IMPRESSION: Persistent right upper lobe airspace disease compatible with pneumonia. Electronically Signed   By: Kerby Moors M.D.   On: 07/02/2022 06:28   CT VENOGRAM HEAD  Result Date: 07/01/2022 CLINICAL DATA:  Headache, classic migraine headache; Headache, tension-type EXAM: CT ANGIOGRAPHY HEAD CT VENOGRAM HEAD TECHNIQUE: Multidetector CT imaging of the head was performed using the standard protocol during bolus administration of intravenous contrast. Venographic phase images of the brain were obtained following the administration of intravenous contrast. Multiplanar reformats and maximum intensity projections were generated. RADIATION DOSE REDUCTION: This exam was performed according to the departmental dose-optimization program which includes automated exposure control, adjustment of the mA and/or kV according to patient size and/or use of iterative reconstruction technique. CONTRAST:  57m OMNIPAQUE IOHEXOL 350  MG/ML SOLN COMPARISON:  Same day CT head. FINDINGS: CTA: Anterior circulation: Bilateral intracranial ICAs, MCAs, and ACAs are patent without proximal hemodynamically significant stenosis. No aneurysm identified. Posterior circulation: Left intradural  vertebral artery is non dominant and terminates as PICA, anatomic variant. Right intradural vertebral artery, basilar artery and bilateral posterior cerebral arteries are patent without proximal hemodynamically significant stenosis. No aneurysm identified CTV: No evidence of dural venous sinus thrombosis. The superior sagittal, transverse and sigmoid sinuses are patent. Narrowing of the left transverse sinus. The straight sinus and visualized deep cerebral veins are patent IMPRESSION: 1. No evidence of large vessel occlusion or proximal hemodynamically significant stenosis. No aneurysm identified. 2. No evidence of dural venous sinus thrombosis. Electronically Signed   By: Margaretha Sheffield M.D.   On: 07/01/2022 15:04   CT Angio Head W or Wo Contrast  Result Date: 07/01/2022 CLINICAL DATA:  Headache, classic migraine headache; Headache, tension-type EXAM: CT ANGIOGRAPHY HEAD CT VENOGRAM HEAD TECHNIQUE: Multidetector CT imaging of the head was performed using the standard protocol during bolus administration of intravenous contrast. Venographic phase images of the brain were obtained following the administration of intravenous contrast. Multiplanar reformats and maximum intensity projections were generated. RADIATION DOSE REDUCTION: This exam was performed according to the departmental dose-optimization program which includes automated exposure control, adjustment of the mA and/or kV according to patient size and/or use of iterative reconstruction technique. CONTRAST:  24m OMNIPAQUE IOHEXOL 350 MG/ML SOLN COMPARISON:  Same day CT head. FINDINGS: CTA: Anterior circulation: Bilateral intracranial ICAs, MCAs, and ACAs are patent without proximal hemodynamically significant stenosis. No aneurysm identified. Posterior circulation: Left intradural vertebral artery is non dominant and terminates as PICA, anatomic variant. Right intradural vertebral artery, basilar artery and bilateral posterior cerebral arteries are  patent without proximal hemodynamically significant stenosis. No aneurysm identified CTV: No evidence of dural venous sinus thrombosis. The superior sagittal, transverse and sigmoid sinuses are patent. Narrowing of the left transverse sinus. The straight sinus and visualized deep cerebral veins are patent IMPRESSION: 1. No evidence of large vessel occlusion or proximal hemodynamically significant stenosis. No aneurysm identified. 2. No evidence of dural venous sinus thrombosis. Electronically Signed   By: FMargaretha SheffieldM.D.   On: 07/01/2022 15:04   CT Head Wo Contrast  Result Date: 07/01/2022 CLINICAL DATA:  Left-sided headache.  Cough. EXAM: CT HEAD WITHOUT CONTRAST TECHNIQUE: Contiguous axial images were obtained from the base of the skull through the vertex without intravenous contrast. RADIATION DOSE REDUCTION: This exam was performed according to the departmental dose-optimization program which includes automated exposure control, adjustment of the mA and/or kV according to patient size and/or use of iterative reconstruction technique. COMPARISON:  05/07/2016. FINDINGS: Brain: No evidence of acute infarction, hemorrhage, hydrocephalus, extra-axial collection or mass lesion/mass effect. Vascular: No hyperdense vessel or unexpected calcification. Skull: Normal. Negative for fracture or focal lesion. Sinuses/Orbits: Visualized globes and orbits are unremarkable. The visualized sinuses are clear. Other: None. IMPRESSION: Normal unenhanced CT scan of the brain. Electronically Signed   By: DLajean ManesM.D.   On: 07/01/2022 12:28   DG Chest 2 View  Result Date: 07/01/2022 CLINICAL DATA:  Headache, cough, malaise, hypertension EXAM: CHEST - 2 VIEW COMPARISON:  06/12/2018 FINDINGS: Normal heart size, mediastinal contours, and pulmonary vascularity. Atherosclerotic calcification aorta. LEFT subclavian sequential transvenous pacemaker leads project at RIGHT atrium and RIGHT ventricle. Patchy infiltrates  identified greatest RIGHT upper lobe consistent with pneumonia. No pleural effusion or pneumothorax. BILATERAL breast prostheses. Bones demineralized. IMPRESSION: RIGHT upper lobe  infiltrate consistent with pneumonia. Aortic Atherosclerosis (ICD10-I70.0). Electronically Signed   By: Lavonia Dana M.D.   On: 07/01/2022 10:09      Subjective:  No significant events overnight, diet has improved, no bowel movement yet, patient herself denies any complaints.  No headache, no visual problems  Discharge Exam: Vitals:   07/08/22 0200 07/08/22 0800  BP:  (!) 115/59  Pulse: 64 67  Resp: 17 17  Temp:  98.4 F (36.9 C)  SpO2: 90% 95%   Vitals:   07/08/22 0000 07/08/22 0100 07/08/22 0200 07/08/22 0800  BP: (!) 147/58   (!) 115/59  Pulse: 61 63 64 67  Resp: '19 17 17 17  '$ Temp: 98.3 F (36.8 C)   98.4 F (36.9 C)  TempSrc: Oral   Oral  SpO2: 90% 93% 90% 95%  Weight:      Height:        General: Pt is alert, awake, pleasant, frail, site of left temporal biopsy healing nicely, not in acute distress Cardiovascular: RRR, S1/S2 +, no rubs, no gallops Respiratory: Improved air entry in the right lung base. Abdominal: Soft, NT, ND, bowel sounds + Extremities: no edema, no cyanosis    The results of significant diagnostics from this hospitalization (including imaging, microbiology, ancillary and laboratory) are listed below for reference.     Microbiology: Recent Results (from the past 240 hour(s))  Resp Panel by RT-PCR (Flu A&B, Covid) Anterior Nasal Swab     Status: None   Collection Time: 07/01/22  9:45 AM   Specimen: Anterior Nasal Swab  Result Value Ref Range Status   SARS Coronavirus 2 by RT PCR NEGATIVE NEGATIVE Final    Comment: (NOTE) SARS-CoV-2 target nucleic acids are NOT DETECTED.  The SARS-CoV-2 RNA is generally detectable in upper respiratory specimens during the acute phase of infection. The lowest concentration of SARS-CoV-2 viral copies this assay can detect is 138  copies/mL. A negative result does not preclude SARS-Cov-2 infection and should not be used as the sole basis for treatment or other patient management decisions. A negative result may occur with  improper specimen collection/handling, submission of specimen other than nasopharyngeal swab, presence of viral mutation(s) within the areas targeted by this assay, and inadequate number of viral copies(<138 copies/mL). A negative result must be combined with clinical observations, patient history, and epidemiological information. The expected result is Negative.  Fact Sheet for Patients:  EntrepreneurPulse.com.au  Fact Sheet for Healthcare Providers:  IncredibleEmployment.be  This test is no t yet approved or cleared by the [place] FDA and  has been authorized for detection and/or diagnosis of SARS-CoV-2 by FDA under an Emergency Use Authorization (EUA). This EUA will remain  in effect (meaning this test can be used) for the duration of the COVID-19 declaration under Section 564(b)(1) of the Act, 21 U.S.C.section 360bbb-3(b)(1), unless the authorization is terminated  or revoked sooner.       Influenza A by PCR NEGATIVE NEGATIVE Final   Influenza B by PCR NEGATIVE NEGATIVE Final    Comment: (NOTE) The Xpert Xpress SARS-CoV-2/FLU/RSV plus assay is intended as an aid in the diagnosis of influenza from Nasopharyngeal swab specimens and should not be used as a sole basis for treatment. Nasal washings and aspirates are unacceptable for Xpert Xpress SARS-CoV-2/FLU/RSV testing.  Fact Sheet for Patients: EntrepreneurPulse.com.au  Fact Sheet for Healthcare Providers: IncredibleEmployment.be  This test is not yet approved or cleared by the [place] FDA and has been authorized for detection and/or diagnosis of SARS-CoV-2  by FDA under an Emergency Use Authorization (EUA). This EUA will remain in effect (meaning  this test can be used) for the duration of the COVID-19 declaration under Section 564(b)(1) of the Act, 21 U.S.C. section 360bbb-3(b)(1), unless the authorization is terminated or revoked.  Performed at [facility], [address], [place], [place] [address]   Respiratory (~20 pathogens) panel by PCR     Status: None   Collection Time: 07/01/22  8:52 PM   Specimen: Nasopharyngeal Swab; Respiratory  Result Value Ref Range Status   Adenovirus NOT DETECTED NOT DETECTED Final   Coronavirus 229E NOT DETECTED NOT DETECTED Final    Comment: (NOTE) The Coronavirus on the Respiratory Panel, DOES NOT test for the novel  Coronavirus (2019 nCoV)    Coronavirus HKU1 NOT DETECTED NOT DETECTED Final   Coronavirus NL63 NOT DETECTED NOT DETECTED Final   Coronavirus OC43 NOT DETECTED NOT DETECTED Final   Metapneumovirus NOT DETECTED NOT DETECTED Final   Rhinovirus / Enterovirus NOT DETECTED NOT DETECTED Final   Influenza A NOT DETECTED NOT DETECTED Final   Influenza B NOT DETECTED NOT DETECTED Final   Parainfluenza Virus 1 NOT DETECTED NOT DETECTED Final   Parainfluenza Virus 2 NOT DETECTED NOT DETECTED Final   Parainfluenza Virus 3 NOT DETECTED NOT DETECTED Final   Parainfluenza Virus 4 NOT DETECTED NOT DETECTED Final   Respiratory Syncytial Virus NOT DETECTED NOT DETECTED Final   Bordetella pertussis NOT DETECTED NOT DETECTED Final   Bordetella Parapertussis NOT DETECTED NOT DETECTED Final   Chlamydophila pneumoniae NOT DETECTED NOT DETECTED Final   Mycoplasma pneumoniae NOT DETECTED NOT DETECTED Final    Comment: Performed at [facility], [address] [place], [place] [address]  MRSA Next Gen by PCR, Nasal     Status: None   Collection Time: 07/02/22  5:50 AM   Specimen: Nasal Mucosa; Nasal Swab  Result Value Ref Range Status   MRSA by PCR Next Gen NOT DETECTED NOT DETECTED Final    Comment: (NOTE) The GeneXpert MRSA Assay (FDA approved for NASAL specimens  only), is one component of a comprehensive MRSA colonization surveillance program. It is not intended to diagnose MRSA infection nor to guide or monitor treatment for MRSA infections. Test performance is not FDA approved in patients less than 91 years old. Performed at [facility], [address], [place], [place] [address]   Gram stain     Status: None   Collection Time: 07/07/22 10:18 AM   Specimen: Lung, Right; Pleural Fluid  Result Value Ref Range Status   Specimen Description PLEURAL  Final   Special Requests NONE  Final   Gram Stain   Final    WBC PRESENT,BOTH PMN AND MONONUCLEAR NO ORGANISMS SEEN CYTOSPIN SMEAR Performed at [facility], [address], [place], [place] [address]    Report Status 07/07/2022 FINAL  Final     Labs: BNP (last 3 results) Recent Labs    07/04/22 0625 07/05/22 0100 07/06/22 0409  BNP 443.2* 311.3* 295.6*   Basic Metabolic Panel: Recent Labs  Lab 07/02/22 0737 07/03/22 0652 07/04/22 0625 07/05/22 0100 07/06/22 0409 07/07/22 0458 07/08/22 0429  NA 135 135 133* 134* 137 139 137  K 3.9 4.0 3.9 3.7 4.3 4.2 3.8  CL 107 109 111 110 111 110 109  CO2 24 19* 18* 18* 19* 22 20*  GLUCOSE 102* 101* 88 120* 125* 100* 91  BUN '20 11 14 19 21 21 '$ 20  CREATININE 0.74 0.70 0.70 0.64 0.73 0.77 0.59  CALCIUM 8.0* 8.1* 8.0* 8.5* 8.8* 9.0 8.5*  MG 1.6* 2.5* 2.1 1.9 2.0  --   --    Liver Function Tests: Recent Labs  Lab 07/04/22 0625 07/05/22 0100 07/06/22 0409 07/07/22 0458 07/07/22 1121 07/08/22 0429  AST 200* 166* 272* 285*  --  257*  ALT 214* 199* 245* 276*  --  308*  ALKPHOS 72 74 78 73  --  69  BILITOT 0.4 0.4 0.1* 0.2*  --  0.2*  PROT 5.5* 5.6* 5.8* 5.4* 5.0* 5.3*  ALBUMIN 2.2* 2.2* 2.2* 2.0*  --  2.0*   No results for input(s): "LIPASE", "AMYLASE" in the last 168 hours. Recent Labs  Lab 07/07/22 1121  AMMONIA 29   CBC: Recent Labs  Lab 07/03/22 0652 07/04/22 0625 07/05/22 0100 07/06/22 0409 07/07/22 0458  07/08/22 0429  WBC 7.5 6.6 6.6 7.2 7.6 8.7  NEUTROABS 5.5 4.4 4.5 4.8 4.7  --   HGB 8.9* 8.8* 8.2* 9.3* 9.1* 8.6*  HCT 26.9* 26.7* 25.0* 28.6* 28.0* 26.8*  MCV 100.0 100.8* 98.8 99.7 98.9 100.4*  PLT 183 172 209 259 310 280   Cardiac Enzymes: No results for input(s): "CKTOTAL", "CKMB", "CKMBINDEX", "TROPONINI" in the last 168 hours. BNP: Invalid input(s): "POCBNP" CBG: No results for input(s): "GLUCAP" in the last 168 hours. D-Dimer No results for input(s): "DDIMER" in the last 72 hours. Hgb A1c No results for input(s): "HGBA1C" in the last 72 hours. Lipid Profile No results for input(s): "CHOL", "HDL", "LDLCALC", "TRIG", "CHOLHDL", "LDLDIRECT" in the last 72 hours. Thyroid function studies No results for input(s): "TSH", "T4TOTAL", "T3FREE", "THYROIDAB" in the last 72 hours.  Invalid input(s): "FREET3" Anemia work up No results for input(s): "VITAMINB12", "FOLATE", "FERRITIN", "TIBC", "IRON", "RETICCTPCT" in the last 72 hours. Urinalysis    Component Value Date/Time   COLORURINE YELLOW 08/04/2017 1634   APPEARANCEUR CLEAR 08/04/2017 1634   LABSPEC 1.012 08/04/2017 1634   PHURINE 6.0 08/04/2017 1634   GLUCOSEU NEGATIVE 08/04/2017 1634   GLUCOSEU NEGATIVE 06/15/2007 1022   HGBUR TRACE (A) 08/04/2017 1634   BILIRUBINUR neg 04/12/2022 1600   KETONESUR NEGATIVE 08/04/2017 1634   PROTEINUR Negative 04/12/2022 1600   PROTEINUR NEGATIVE 08/04/2017 1634   UROBILINOGEN 0.2 04/12/2022 1600   UROBILINOGEN 0.2 03/29/2012 1228   NITRITE neg 04/12/2022 1600   NITRITE NEGATIVE 02/25/2016 1431   LEUKOCYTESUR Negative 04/12/2022 1600   Sepsis Labs Recent Labs  Lab 07/05/22 0100 07/06/22 0409 07/07/22 0458 07/08/22 0429  WBC 6.6 7.2 7.6 8.7   Microbiology Recent Results (from the past 240 hour(s))  Resp Panel by RT-PCR (Flu A&B, Covid) Anterior Nasal Swab     Status: None   Collection Time: 07/01/22  9:45 AM   Specimen: Anterior Nasal Swab  Result Value Ref Range Status    SARS Coronavirus 2 by RT PCR NEGATIVE NEGATIVE Final    Comment: (NOTE) SARS-CoV-2 target nucleic acids are NOT DETECTED.  The SARS-CoV-2 RNA is generally detectable in upper respiratory specimens during the acute phase of infection. The lowest concentration of SARS-CoV-2 viral copies this assay can detect is 138 copies/mL. A negative result does not preclude SARS-Cov-2 infection and should not be used as the sole basis for treatment or other patient management decisions. A negative result may occur with  improper specimen collection/handling, submission of specimen other than nasopharyngeal swab, presence of viral mutation(s) within the areas targeted by this assay, and inadequate number of viral copies(<138 copies/mL). A negative  result must be combined with clinical observations, patient history, and epidemiological information. The expected result is Negative.  Fact Sheet for Patients:  EntrepreneurPulse.com.au  Fact Sheet for Healthcare Providers:  IncredibleEmployment.be  This test is no t yet approved or cleared by the [place] FDA and  has been authorized for detection and/or diagnosis of SARS-CoV-2 by FDA under an Emergency Use Authorization (EUA). This EUA will remain  in effect (meaning this test can be used) for the duration of the COVID-19 declaration under Section 564(b)(1) of the Act, 21 U.S.C.section 360bbb-3(b)(1), unless the authorization is terminated  or revoked sooner.       Influenza A by PCR NEGATIVE NEGATIVE Final   Influenza B by PCR NEGATIVE NEGATIVE Final    Comment: (NOTE) The Xpert Xpress SARS-CoV-2/FLU/RSV plus assay is intended as an aid in the diagnosis of influenza from Nasopharyngeal swab specimens and should not be used as a sole basis for treatment. Nasal washings and aspirates are unacceptable for Xpert Xpress SARS-CoV-2/FLU/RSV testing.  Fact Sheet for  Patients: EntrepreneurPulse.com.au  Fact Sheet for Healthcare Providers: IncredibleEmployment.be  This test is not yet approved or cleared by the [place] FDA and has been authorized for detection and/or diagnosis of SARS-CoV-2 by FDA under an Emergency Use Authorization (EUA). This EUA will remain in effect (meaning this test can be used) for the duration of the COVID-19 declaration under Section 564(b)(1) of the Act, 21 U.S.C. section 360bbb-3(b)(1), unless the authorization is terminated or revoked.  Performed at [facility], [address], [place], [place] [address]   Respiratory (~20 pathogens) panel by PCR     Status: None   Collection Time: 07/01/22  8:52 PM   Specimen: Nasopharyngeal Swab; Respiratory  Result Value Ref Range Status   Adenovirus NOT DETECTED NOT DETECTED Final   Coronavirus 229E NOT DETECTED NOT DETECTED Final    Comment: (NOTE) The Coronavirus on the Respiratory Panel, DOES NOT test for the novel  Coronavirus (2019 nCoV)    Coronavirus HKU1 NOT DETECTED NOT DETECTED Final   Coronavirus NL63 NOT DETECTED NOT DETECTED Final   Coronavirus OC43 NOT DETECTED NOT DETECTED Final   Metapneumovirus NOT DETECTED NOT DETECTED Final   Rhinovirus / Enterovirus NOT DETECTED NOT DETECTED Final   Influenza A NOT DETECTED NOT DETECTED Final   Influenza B NOT DETECTED NOT DETECTED Final   Parainfluenza Virus 1 NOT DETECTED NOT DETECTED Final   Parainfluenza Virus 2 NOT DETECTED NOT DETECTED Final   Parainfluenza Virus 3 NOT DETECTED NOT DETECTED Final   Parainfluenza Virus 4 NOT DETECTED NOT DETECTED Final   Respiratory Syncytial Virus NOT DETECTED NOT DETECTED Final   Bordetella pertussis NOT DETECTED NOT DETECTED Final   Bordetella Parapertussis NOT DETECTED NOT DETECTED Final   Chlamydophila pneumoniae NOT DETECTED NOT DETECTED Final   Mycoplasma pneumoniae NOT DETECTED NOT DETECTED Final    Comment:  Performed at [facility], [address], [place], [place] [address]  MRSA Next Gen by PCR, Nasal     Status: None   Collection Time: 07/02/22  5:50 AM   Specimen: Nasal Mucosa; Nasal Swab  Result Value Ref Range Status   MRSA by PCR Next Gen NOT DETECTED NOT DETECTED Final    Comment: (NOTE) The GeneXpert MRSA Assay (FDA approved for NASAL specimens only), is one component of a comprehensive MRSA colonization surveillance program. It is not intended to diagnose MRSA infection nor to guide or monitor treatment for MRSA infections. Test performance is not  FDA approved in patients less than 64 years old. Performed at [facility], [place] [address], [place], [place] [address]   Gram stain     Status: None   Collection Time: 07/07/22 10:18 AM   Specimen: Lung, Right; Pleural Fluid  Result Value Ref Range Status   Specimen Description PLEURAL  Final   Special Requests NONE  Final   Gram Stain   Final    WBC PRESENT,BOTH PMN AND MONONUCLEAR NO ORGANISMS SEEN CYTOSPIN SMEAR Performed at [facility], [address], [place], [place] [address]    Report Status 07/07/2022 FINAL  Final     Time coordinating discharge: Over 30 minutes  SIGNED:   Phillips Climes, MD  [facility] 07/08/2022, 4:48 PM Pager   If 7PM-7AM, please contact night-coverage [email or web address]

## 2022-07-08 NOTE — Care Management (Addendum)
    Durable Medical Equipment  (From admission, onward)           Start     Ordered   07/08/22 0937  For home use only DME lightweight manual wheelchair with seat cushion  Once       Comments: Patient suffers from Weakness which impairs their ability to perform daily activities like toileting in the home.  A walker will not resolve  issue with performing activities of daily living. A wheelchair will allow patient to safely perform daily activities. Patient is not able to propel themselves in the home using a standard weight wheelchair due to general weakness. Patient can self propel in the lightweight wheelchair. Length of need Lifetime. Accessories: elevating leg rests (ELRs), wheel locks, extensions and anti-tippers.   07/08/22 4619

## 2022-07-08 NOTE — Progress Notes (Signed)
   07/08/22 1522  AVS Discharge Documentation  AVS Discharge Instructions Including Medications Provided to patient/caregiver  Name of Person Receiving AVS Discharge Instructions Including Medications [name] and [name], RN  Name of Clinician That Reviewed AVS Discharge Instructions Including Medications Venida Jarvis, RN

## 2022-07-08 NOTE — Care Management (Signed)
Called [employer] x 2  regarding updates for wheelchair, patient is waiting to be discharged. Conferred with  Lucretia in [place]. She will get Director notified asap, RN aware to ask the family for 15 minutes to get the equipment up.

## 2022-07-09 ENCOUNTER — Telehealth: Payer: Self-pay | Admitting: Internal Medicine

## 2022-07-09 DIAGNOSIS — E785 Hyperlipidemia, unspecified: Secondary | ICD-10-CM | POA: Diagnosis not present

## 2022-07-09 DIAGNOSIS — N182 Chronic kidney disease, stage 2 (mild): Secondary | ICD-10-CM | POA: Diagnosis not present

## 2022-07-09 DIAGNOSIS — G4733 Obstructive sleep apnea (adult) (pediatric): Secondary | ICD-10-CM | POA: Diagnosis not present

## 2022-07-09 DIAGNOSIS — M810 Age-related osteoporosis without current pathological fracture: Secondary | ICD-10-CM | POA: Diagnosis not present

## 2022-07-09 DIAGNOSIS — I2510 Atherosclerotic heart disease of native coronary artery without angina pectoris: Secondary | ICD-10-CM | POA: Diagnosis not present

## 2022-07-09 DIAGNOSIS — D631 Anemia in chronic kidney disease: Secondary | ICD-10-CM | POA: Diagnosis not present

## 2022-07-09 DIAGNOSIS — I130 Hypertensive heart and chronic kidney disease with heart failure and stage 1 through stage 4 chronic kidney disease, or unspecified chronic kidney disease: Secondary | ICD-10-CM | POA: Diagnosis not present

## 2022-07-09 DIAGNOSIS — I6523 Occlusion and stenosis of bilateral carotid arteries: Secondary | ICD-10-CM | POA: Diagnosis not present

## 2022-07-09 DIAGNOSIS — R911 Solitary pulmonary nodule: Secondary | ICD-10-CM | POA: Diagnosis not present

## 2022-07-09 DIAGNOSIS — K219 Gastro-esophageal reflux disease without esophagitis: Secondary | ICD-10-CM | POA: Diagnosis not present

## 2022-07-09 DIAGNOSIS — F411 Generalized anxiety disorder: Secondary | ICD-10-CM | POA: Diagnosis not present

## 2022-07-09 DIAGNOSIS — G8929 Other chronic pain: Secondary | ICD-10-CM | POA: Diagnosis not present

## 2022-07-09 DIAGNOSIS — R413 Other amnesia: Secondary | ICD-10-CM | POA: Diagnosis not present

## 2022-07-09 DIAGNOSIS — E871 Hypo-osmolality and hyponatremia: Secondary | ICD-10-CM | POA: Diagnosis not present

## 2022-07-09 DIAGNOSIS — M47819 Spondylosis without myelopathy or radiculopathy, site unspecified: Secondary | ICD-10-CM | POA: Diagnosis not present

## 2022-07-09 DIAGNOSIS — J189 Pneumonia, unspecified organism: Secondary | ICD-10-CM | POA: Diagnosis not present

## 2022-07-09 DIAGNOSIS — J42 Unspecified chronic bronchitis: Secondary | ICD-10-CM | POA: Diagnosis not present

## 2022-07-09 DIAGNOSIS — G4489 Other headache syndrome: Secondary | ICD-10-CM | POA: Diagnosis not present

## 2022-07-09 DIAGNOSIS — R011 Cardiac murmur, unspecified: Secondary | ICD-10-CM | POA: Diagnosis not present

## 2022-07-09 DIAGNOSIS — I495 Sick sinus syndrome: Secondary | ICD-10-CM | POA: Diagnosis not present

## 2022-07-09 DIAGNOSIS — I509 Heart failure, unspecified: Secondary | ICD-10-CM | POA: Diagnosis not present

## 2022-07-09 DIAGNOSIS — F3181 Bipolar II disorder: Secondary | ICD-10-CM | POA: Diagnosis not present

## 2022-07-09 DIAGNOSIS — M1612 Unilateral primary osteoarthritis, left hip: Secondary | ICD-10-CM | POA: Diagnosis not present

## 2022-07-09 DIAGNOSIS — E039 Hypothyroidism, unspecified: Secondary | ICD-10-CM | POA: Diagnosis not present

## 2022-07-09 LAB — CYTOLOGY - NON PAP

## 2022-07-09 NOTE — Telephone Encounter (Addendum)
Meghan Oliver was discharged from hospital yesterday. Need to do TOC, may need to speak with her husband Dr Velora Heckler or her nurse [name].  Hospital follow up scheduled thru [facility] for 07/19/2022 at 12:00  When you call we need to schedule a lab appointment on Friday  for the below labs a also get her to go for the CXR  CBC, C-met and CXR (Pa and lateral) the day before OV- they request 7 days follow up in discharge summary

## 2022-07-12 ENCOUNTER — Ambulatory Visit: Payer: Medicare Other | Admitting: Psychiatry

## 2022-07-12 DIAGNOSIS — G4489 Other headache syndrome: Secondary | ICD-10-CM | POA: Diagnosis not present

## 2022-07-12 DIAGNOSIS — D631 Anemia in chronic kidney disease: Secondary | ICD-10-CM | POA: Diagnosis not present

## 2022-07-12 DIAGNOSIS — J189 Pneumonia, unspecified organism: Secondary | ICD-10-CM | POA: Diagnosis not present

## 2022-07-12 DIAGNOSIS — I130 Hypertensive heart and chronic kidney disease with heart failure and stage 1 through stage 4 chronic kidney disease, or unspecified chronic kidney disease: Secondary | ICD-10-CM | POA: Diagnosis not present

## 2022-07-12 DIAGNOSIS — I509 Heart failure, unspecified: Secondary | ICD-10-CM | POA: Diagnosis not present

## 2022-07-12 DIAGNOSIS — N182 Chronic kidney disease, stage 2 (mild): Secondary | ICD-10-CM | POA: Diagnosis not present

## 2022-07-12 NOTE — Telephone Encounter (Signed)
Transition Care Management Follow-up Telephone Call Date of discharge and from where: 07/08/22  How have you been since you were released from the hospital? Improving Any questions or concerns? No  Items Reviewed: Did the pt receive and understand the discharge instructions provided? No  Medications obtained and verified? Yes  Other? No  Any new allergies since your discharge? No  Dietary orders reviewed? No Do you have support at home? Yes   Home Care and Equipment/Supplies: Were home health services ordered? yes If so, what is the name of the agency? [facility]  Has the agency set up a time to come to the patient's home? yes Were any new equipment or medical supplies ordered?  No What is the name of the medical supply agency? N/A Were you able to get the supplies/equipment? yes Do you have any questions related to the use of the equipment or supplies? No  Functional Questionnaire: (I = Independent and D = Dependent) ADLs: D  Bathing/Dressing- D  Meal Prep- D   Eating- D  Maintaining continence- I  Transferring/Ambulation- D  Managing Meds- D  Follow up appointments reviewed:  PCP Hospital f/u appt confirmed? Yes  Scheduled to see Dr. Renold Genta on 07/19/22 @ 12p. [facility] f/u appt confirmed? No   Are transportation arrangements needed? No  If their condition worsens, is the pt aware to call PCP or go to the Emergency Dept.? Yes Was the patient provided with contact information for the PCP's office or ED? Yes Was to pt encouraged to call back with questions or concerns? Yes

## 2022-07-13 ENCOUNTER — Other Ambulatory Visit (HOSPITAL_BASED_OUTPATIENT_CLINIC_OR_DEPARTMENT_OTHER): Payer: Self-pay

## 2022-07-13 DIAGNOSIS — J189 Pneumonia, unspecified organism: Secondary | ICD-10-CM | POA: Diagnosis not present

## 2022-07-13 DIAGNOSIS — N182 Chronic kidney disease, stage 2 (mild): Secondary | ICD-10-CM | POA: Diagnosis not present

## 2022-07-13 DIAGNOSIS — D631 Anemia in chronic kidney disease: Secondary | ICD-10-CM | POA: Diagnosis not present

## 2022-07-13 DIAGNOSIS — I509 Heart failure, unspecified: Secondary | ICD-10-CM | POA: Diagnosis not present

## 2022-07-13 DIAGNOSIS — G4489 Other headache syndrome: Secondary | ICD-10-CM | POA: Diagnosis not present

## 2022-07-13 DIAGNOSIS — I130 Hypertensive heart and chronic kidney disease with heart failure and stage 1 through stage 4 chronic kidney disease, or unspecified chronic kidney disease: Secondary | ICD-10-CM | POA: Diagnosis not present

## 2022-07-15 DIAGNOSIS — D631 Anemia in chronic kidney disease: Secondary | ICD-10-CM | POA: Diagnosis not present

## 2022-07-15 DIAGNOSIS — G4489 Other headache syndrome: Secondary | ICD-10-CM | POA: Diagnosis not present

## 2022-07-15 DIAGNOSIS — J189 Pneumonia, unspecified organism: Secondary | ICD-10-CM | POA: Diagnosis not present

## 2022-07-15 DIAGNOSIS — N182 Chronic kidney disease, stage 2 (mild): Secondary | ICD-10-CM | POA: Diagnosis not present

## 2022-07-15 DIAGNOSIS — I509 Heart failure, unspecified: Secondary | ICD-10-CM | POA: Diagnosis not present

## 2022-07-15 DIAGNOSIS — I130 Hypertensive heart and chronic kidney disease with heart failure and stage 1 through stage 4 chronic kidney disease, or unspecified chronic kidney disease: Secondary | ICD-10-CM | POA: Diagnosis not present

## 2022-07-16 ENCOUNTER — Other Ambulatory Visit: Payer: Medicare Other

## 2022-07-16 DIAGNOSIS — I10 Essential (primary) hypertension: Secondary | ICD-10-CM

## 2022-07-16 DIAGNOSIS — F3181 Bipolar II disorder: Secondary | ICD-10-CM

## 2022-07-16 NOTE — Progress Notes (Unsigned)
Office Visit Note  Patient: Meghan Oliver             Date of Birth: 1941-07-09           MRN: [id number]             PCP: [name], MD Referring: [name], MD Visit Date: 07/19/2022 Occupation: @GUAROCC @  Subjective:  No chief complaint on file.   History of Present Illness: Meghan Oliver is a 81 y.o. female ***   Activities of Daily Living:  Patient reports morning stiffness for *** {minute/hour:19697}.   Patient {ACTIONS;DENIES/REPORTS:21021675::"Denies"} nocturnal pain.  Difficulty dressing/grooming: {ACTIONS;DENIES/REPORTS:21021675::"Denies"} Difficulty climbing stairs: {ACTIONS;DENIES/REPORTS:21021675::"Denies"} Difficulty getting out of chair: {ACTIONS;DENIES/REPORTS:21021675::"Denies"} Difficulty using hands for taps, buttons, cutlery, and/or writing: {ACTIONS;DENIES/REPORTS:21021675::"Denies"}  No Rheumatology ROS completed.   PMFS History:  Patient Active Problem List   Diagnosis Date Noted   Elevated erythrocyte sedimentation rate    Elevated LFTs    Community acquired pneumonia 07/01/2022   Memory loss 07/01/2022   Temporal headache 07/01/2022   Macrocytic anemia 07/01/2022   Elevated troponin 07/01/2022   Chronic constipation 07/01/2022   S/P total right hip arthroplasty 05/18/2022   Overactive bladder 02/19/2022   S/P placement of cardiac pacemaker 03/17/18 [facility]  03/18/2018   Sinus node dysfunction (HCC) 03/17/2018   Bradycardia 07/16/2017   Unstable angina (HCC)    Hypotension 04/20/2017   Coronary artery calcification seen on CAT scan 06/25/2012   Lung nodule 06/25/2012   Hypothyroidism 03/21/2012   bipolar/depression    Chronic cough 08/12/2011   OSA (obstructive sleep apnea) 04/05/2011   EDEMA 11/06/2010   Chest pain 11/06/2010   Hyperlipidemia 08/22/2007   Bipolar disorder ([place]) 08/22/2007   GERD 08/22/2007   LOW BACK PAIN 08/22/2007   OSTEOPOROSIS 08/22/2007   INSOMNIA 30/94/0768   SYSTOLIC MURMUR 08/81/1031    Past  Medical History:  Diagnosis Date   Anxiety    Arthritis    "hips, spine" (03/17/2018)   Bipolar II disorder (HCC)    CHF (congestive heart failure) (HCC)    Chronic bronchitis (HCC)    Chronic lower back pain    Chronic right hip pain    CKD (chronic kidney disease), stage II    Coronary artery disease    stent x1   Esophagitis, erosive    GAD (generalized anxiety disorder)    GERD (gastroesophageal reflux disease)    Headache    "maybe monthly" (03/17/2018))   Heart murmur, systolic    History of adenomatous polyp of colon    08-04-2016  tubular adenoma   History of blood transfusion 12/2017   "related to vascular hematoma"   History of electroconvulsive therapy    at [facility]--  started 04-15-2015 to 11-17-2016  total greater than 40 times   History of hiatal hernia    Hyperlipidemia    Hypertension    Hypothyroidism    Internal carotid artery stenosis, bilateral    per last duplex 05-01-2014  bilateral ICA 40-59%   Major depression, chronic    ECT treatments extensive and multiple started 07/ 2016   Memory loss    "both short and long-term; needs frequent reminders to follow instrucitons" (05/16/2017)   Migraines    "none in years" (03/17/2018)   OSA (obstructive sleep apnea)    per study 06/ 2012 moderate OSA  ; "refuses to wear masks" (03/17/2018)   Osteoporosis    Poor historian    due to short term memory loss   Presence  of permanent cardiac pacemaker 03/17/2018   Pulmonary nodule    monitored by pcp   S/P placement of cardiac pacemaker 03/17/18 [facility]  03/18/2018   Short-term memory loss    Sick sinus syndrome (HCC)     Family History  Problem Relation Age of Onset   Heart attack Father 64       deceased   Hypertension Father    Heart disease Father    Breast cancer Paternal Grandmother        Age unknown   Breast cancer Paternal Aunt        Age 65's   Colon cancer Neg Hx    Past Surgical History:  Procedure Laterality Date   APPENDECTOMY  1971   ARTERY  BIOPSY Left 07/05/2022   Procedure: BIOPSY TEMPORAL ARTERY;  Surgeon: Broadus John, MD;  Location: [facility] OR;  Service: Vascular;  Laterality: Left;   AUGMENTATION MAMMAPLASTY Bilateral    CARDIOVASCULAR STRESS TEST  01-30-2015  dr Aundra Dubin   Low risk nuclear study w/ no evidence ischemia or infarction/  normal LV funciton and wall motion , 76%   COLONOSCOPY W/ BIOPSIES AND POLYPECTOMY  "multiple"   COLONOSCOPY WITH ESOPHAGOGASTRODUODENOSCOPY (EGD)  last one 08-04-2016   CORONARY ANGIOPLASTY WITH STENT PLACEMENT  05/16/2017   "LAD"   CORONARY STENT INTERVENTION N/A 05/16/2017   Procedure: CORONARY STENT INTERVENTION;  Surgeon: Burnell Blanks, MD;  Location: [place] [facility];  Service: Cardiovascular;  Laterality: N/A;   ESOPHAGOGASTRODUODENOSCOPY  02-26-04   ESOPHAGOGASTRODUODENOSCOPY  "multiple"   INSERT / REPLACE / REMOVE PACEMAKER  03/17/2018   IR THORACENTESIS ASP PLEURAL SPACE W/IMG GUIDE  07/07/2022   LEFT HEART CATH AND CORONARY ANGIOGRAPHY N/A 05/16/2017   Procedure: LEFT HEART CATH AND CORONARY ANGIOGRAPHY;  Surgeon: Larey Dresser, MD;  Location: [place] [facility];  Service: Cardiovascular;  Laterality: N/A;   OVARIAN CYST SURGERY  1970s   Laparotomy    PACEMAKER IMPLANT N/A 03/17/2018   Procedure: PACEMAKER IMPLANT;  Surgeon: Evans Lance, MD;  Location: [place] [facility];  Service: Cardiovascular;  Laterality: N/A;   PORT-A-CATH PLACEMENT  05/31/2016; 2018   "@ [facility]; for ECT series"; "@ [facility] also"   PORT-A-CATH REMOVAL N/A 03/11/2017   Procedure: REMOVAL PORT-A-CATH;  Surgeon: Jackolyn Confer, MD;  Location: [facility];  Service: General;  Laterality: N/A;   PORTA CATH REMOVAL  03/17/2018   PORTA CATH REMOVAL  03/17/2018   Procedure: PORTA CATH REMOVAL;  Surgeon: Evans Lance, MD;  Location: [place] [facility];  Service: Cardiovascular;;   TOTAL HIP ARTHROPLASTY Right 05/18/2022   Procedure: TOTAL HIP ARTHROPLASTY ANTERIOR APPROACH;  Surgeon: Paralee Cancel, MD;  Location: [facility];  Service: Orthopedics;  Laterality: Right;   TRANSTHORACIC ECHOCARDIOGRAM  09/05/2013  dr Aundra Dubin   mild LVH, ef 76-81%, grade 1 diastolic dysfunction/  very mild AV stenosis with mild AR/  trivial MR and PT/ mild to moderate LAE/ mild TR/ mild pulmonary hypertension with PA peak pressure 79mHg   TUBAL LIGATION     Social History   Social History Narrative   Not on file   Immunization History  Administered Date(s) Administered   Hepatitis A 09/04/2012   IPV 09/04/2012   Influenza Split 06/20/2012, 07/03/2012, 06/10/2015   Influenza Whole 07/11/2008   Influenza,inj,Quad PF,6+ Mos 07/25/2013, 07/10/2014, 07/29/2016, 08/03/2017, 08/03/2018, 06/13/2019, 07/10/2020   PFIZER(Purple Top)SARS-COV-2 Vaccination 10/15/2019, 11/05/2019, 05/26/2020   Pneumococcal Conjugate-13 06/06/2014   Pneumococcal Polysaccharide-23 09/07/2005  Td 01/30/2007   Typhoid Inactivated 09/04/2012   Zoster, Live 05/16/2009     Objective: Vital Signs: LMP  (LMP Unknown)    Physical Exam   Musculoskeletal Exam: ***  CDAI Exam: CDAI Score: -- Patient Global: --; Provider Global: -- Swollen: --; Tender: -- Joint Exam 07/19/2022   No joint exam has been documented for this visit   There is currently no information documented on the homunculus. Go to the Rheumatology activity and complete the homunculus joint exam.  Investigation: No additional findings.  Imaging: IR THORACENTESIS ASP PLEURAL SPACE W/IMG GUIDE  Result Date: 07/07/2022 INDICATION: Patient with a history of heart failure presents to the hospital with temporal headaches, dry cough and chills. Image findings showed right upper lobe pneumonia and pleural effusions. Interventional radiology asked to perform a diagnostic and therapeutic thoracentesis. EXAM: ULTRASOUND GUIDED THORACENTESIS MEDICATIONS: 1% lidocaine 10 mL COMPLICATIONS: None immediate. PROCEDURE: An ultrasound guided thoracentesis was thoroughly  discussed with the patient and questions answered. The benefits, risks, alternatives and complications were also discussed. The patient understands and wishes to proceed with the procedure. Written consent was obtained. Ultrasound was performed to localize and mark an adequate pocket of fluid in the right chest. The area was then prepped and draped in the normal sterile fashion. 1% Lidocaine was used for local anesthesia. Under ultrasound guidance a 6 Fr Safe-T-Centesis catheter was introduced. Thoracentesis was performed. The catheter was removed and a dressing applied. FINDINGS: A total of approximately 200 mL of clear yellow fluid was removed. Samples were sent to the laboratory as requested by the clinical team. IMPRESSION: Successful ultrasound guided right thoracentesis yielding 200 mL of pleural fluid. Read by: Soyla Dryer, NP Electronically Signed   By: Sandi Mariscal M.D.   On: 07/07/2022 10:48   DG Chest 1 View  Result Date: 07/07/2022 CLINICAL DATA:  Status post right thoracentesis. EXAM: CHEST  1 VIEW COMPARISON:  Chest x-ray 07/04/2022 FINDINGS: The cardiac silhouette, mediastinal and hilar contours are within normal limits and stable. Persistent airspace process in the right lung. No residual pleural effusion and no evidence of postprocedural pneumothorax. IMPRESSION: 1. No residual pleural effusion or postprocedural pneumothorax. 2. Persistent right lung airspace process. Electronically Signed   By: Marijo Sanes M.D.   On: 07/07/2022 10:33   CT Chest High Resolution  Result Date: 07/06/2022 CLINICAL DATA:  82 year old female with history of complicated pneumonia. Evaluate for interstitial lung disease. EXAM: CT CHEST WITHOUT CONTRAST TECHNIQUE: Multidetector CT imaging of the chest was performed following the standard protocol without intravenous contrast. High resolution imaging of the lungs, as well as inspiratory and expiratory imaging, was performed. RADIATION DOSE REDUCTION: This exam  was performed according to the departmental dose-optimization program which includes automated exposure control, adjustment of the mA and/or kV according to patient size and/or use of iterative reconstruction technique. COMPARISON:  Cardiac CT 05/06/2017.  Chest CT 09/05/2013. FINDINGS: Cardiovascular: Heart size is borderline enlarged. There is no significant pericardial fluid, thickening or pericardial calcification. There is aortic atherosclerosis, as well as atherosclerosis of the great vessels of the mediastinum and the coronary arteries, including calcified atherosclerotic plaque in the left main, left anterior descending, left circumflex and right coronary arteries. Calcifications of the aortic valve. Left-sided pacemaker/AICD with lead tips terminating in the right atrium and right ventricle. Mediastinum/Nodes: No pathologically enlarged mediastinal or hilar lymph nodes. Small hiatal hernia. No axillary lymphadenopathy. Lungs/Pleura: Moderate right and small left pleural effusions lying dependently. There are patchy randomly distributed areas of airspace  consolidation, ground-glass attenuation, inter and intra lobular septal thickening and some mild architectural distortion scattered throughout the lungs (right greater than left), most severe in the right upper and right lower lobes, likely to reflect severe multilobar bilateral bronchopneumonia. No definite suspicious appearing pulmonary nodules or masses are noted. Inspiratory and expiratory imaging is unremarkable. Upper Abdomen: Aortic atherosclerosis. Incompletely imaged low-attenuation lesion in the left kidney measuring at least 2.9 cm in diameter, incompletely characterized on today's noncontrast CT examination, but statistically likely a cyst (no imaging follow-up is recommended). Musculoskeletal: There are no aggressive appearing lytic or blastic lesions noted in the visualized portions of the skeleton. Bilateral breast implants are incidentally  noted. IMPRESSION: 1. The appearance of the chest is most compatible with acute multilobar bilateral bronchopneumonia (right greater than left) with moderate right and small left parapneumonic pleural effusions. If there is clinical concern for interstitial lung disease after resolution of the patient's acute illness, repeat high-resolution chest CT could be obtained in 6-12 months to assess for temporal changes in the appearance of the lung parenchyma. 2. Aortic atherosclerosis, in addition to left main and three-vessel coronary artery disease. 3. There are calcifications of the aortic valve. Echocardiographic correlation for evaluation of potential valvular dysfunction may be warranted if clinically indicated. Aortic Atherosclerosis (ICD10-I70.0). Electronically Signed   By: Vinnie Langton M.D.   On: 07/06/2022 06:42   CUP PACEART REMOTE DEVICE CHECK  Result Date: 07/05/2022 Scheduled remote reviewed. Normal device function.  Next remote 91 days. LA  DG Chest Port 1 View  Result Date: 07/04/2022 CLINICAL DATA:  Headache.  Follow-up study. EXAM: PORTABLE CHEST 1 VIEW COMPARISON:  07/02/2022 and older exams. FINDINGS: Interval worsening of lung aeration. Right upper lobe airspace opacity has mildly increased. There is increased opacity in the right perihilar to lower lung. Left lung remains clear. No convincing pleural effusion.  No pneumothorax. Normal size cardiac silhouette and stable left anterior chest wall pacemaker. IMPRESSION: 1. Worsened right lung aeration compared to the most recent prior study. Right lung airspace opacities have increased as detailed consistent with multifocal pneumonia. Electronically Signed   By: Lajean Manes M.D.   On: 07/04/2022 10:14   US Abdomen Limited RUQ (LIVER/GB)  Result Date: 07/04/2022 CLINICAL DATA:  Transaminitis. EXAM: ULTRASOUND ABDOMEN LIMITED RIGHT UPPER QUADRANT COMPARISON:  CT abdomen pelvis, 08/09/2017. FINDINGS: Gallbladder: Dense layering material  consistent with small stones or dense sludge. No wall thickening. No pericholecystic fluid. No sonographic Murphy's sign. Common bile duct: Diameter: 5 mm Liver: No focal lesion identified. Within normal limits in parenchymal echogenicity. Portal vein is patent on color Doppler imaging with normal direction of blood flow towards the liver. Other: Right pleural effusion. IMPRESSION: 1. No acute findings. 2. Dense material layers in the gallbladder consistent with dense sludge or small stones. 3. Normal appearance of the liver.  No bile duct dilation. Electronically Signed   By: Lajean Manes M.D.   On: 07/04/2022 10:12   DG Swallowing Func-Speech Pathology  Result Date: 07/02/2022 Table formatting from the original result was not included. Objective Swallowing Evaluation: Type of Study: MBS-Modified Barium Swallow Study  Patient Details Name: JOOD RETANA MRN: [id number] Date of Birth: Sep 04, 1941 Today's Date: 07/02/2022 Time: SLP Start Time (ACUTE ONLY): 3500 -SLP Stop Time (ACUTE ONLY): 1346 SLP Time Calculation (min) (ACUTE ONLY): 17 min Past Medical History: Past Medical History: Diagnosis Date  Anxiety   Arthritis   "hips, spine" (03/17/2018)  Bipolar II disorder (HCC)   CHF (congestive heart failure) ([place])  Chronic bronchitis (HCC)   Chronic lower back pain   Chronic right hip pain   CKD (chronic kidney disease), stage II   Coronary artery disease   stent x1  Esophagitis, erosive   GAD (generalized anxiety disorder)   GERD (gastroesophageal reflux disease)   Headache   "maybe monthly" (03/17/2018))  Heart murmur, systolic   History of adenomatous polyp of colon   08-04-2016  tubular adenoma  History of blood transfusion 12/2017  "related to vascular hematoma"  History of electroconvulsive therapy   at [place]--  started 04-15-2015 to 11-17-2016  total greater than 40 times  History of hiatal hernia   Hyperlipidemia   Hypertension   Hypothyroidism   Internal carotid artery stenosis, bilateral   per last duplex  05-01-2014  bilateral ICA 40-59%  Major depression, chronic   ECT treatments extensive and multiple started 07/ 2016  Memory loss   "both short and long-term; needs frequent reminders to follow instrucitons" (05/16/2017)  Migraines   "none in years" (03/17/2018)  OSA (obstructive sleep apnea)   per study 06/ 2012 moderate OSA  ; "refuses to wear masks" (03/17/2018)  Osteoporosis   Poor historian   due to short term memory loss  Presence of permanent cardiac pacemaker 03/17/2018  Pulmonary nodule   monitored by pcp  S/P placement of cardiac pacemaker 03/17/18 [facility]  03/18/2018  Short-term memory loss   Sick sinus syndrome ([place])  Past Surgical History: Past Surgical History: Procedure Laterality Date  APPENDECTOMY  1971  AUGMENTATION MAMMAPLASTY Bilateral   CARDIOVASCULAR STRESS TEST  01-30-2015  dr Aundra Dubin  Low risk nuclear study w/ no evidence ischemia or infarction/  normal LV funciton and wall motion , 76%  COLONOSCOPY W/ BIOPSIES AND POLYPECTOMY  "multiple"  COLONOSCOPY WITH ESOPHAGOGASTRODUODENOSCOPY (EGD)  last one 08-04-2016  CORONARY ANGIOPLASTY WITH STENT PLACEMENT  05/16/2017  "LAD"  CORONARY STENT INTERVENTION N/A 05/16/2017  Procedure: CORONARY STENT INTERVENTION;  Surgeon: Burnell Blanks, MD;  Location: [place] [facility];  Service: Cardiovascular;  Laterality: N/A;  ESOPHAGOGASTRODUODENOSCOPY  02-26-04  ESOPHAGOGASTRODUODENOSCOPY  "multiple"  INSERT / REPLACE / REMOVE PACEMAKER  03/17/2018  LEFT HEART CATH AND CORONARY ANGIOGRAPHY N/A 05/16/2017  Procedure: LEFT HEART CATH AND CORONARY ANGIOGRAPHY;  Surgeon: Larey Dresser, MD;  Location: [place] [facility];  Service: Cardiovascular;  Laterality: N/A;  OVARIAN CYST SURGERY  1970s  Laparotomy   PACEMAKER IMPLANT N/A 03/17/2018  Procedure: PACEMAKER IMPLANT;  Surgeon: Evans Lance, MD;  Location: [place] [facility];  Service: Cardiovascular;  Laterality: N/A;  PORT-A-CATH PLACEMENT  05/31/2016; 2018  "@ [facility]; for ECT series"; "@ [facility] also"   PORT-A-CATH REMOVAL N/A 03/11/2017  Procedure: REMOVAL PORT-A-CATH;  Surgeon: Jackolyn Confer, MD;  [facility];  Service: General;  Laterality: N/A;  PORTA CATH REMOVAL  03/17/2018  PORTA CATH REMOVAL  03/17/2018  Procedure: PORTA CATH REMOVAL;  Surgeon: Evans Lance, MD;  Location: [place] [facility];  Service: Cardiovascular;;  TOTAL HIP ARTHROPLASTY Right 05/18/2022  Procedure: TOTAL HIP ARTHROPLASTY ANTERIOR APPROACH;  Surgeon: Paralee Cancel, MD;  Location: [facility];  Service: Orthopedics;  Laterality: Right;  TRANSTHORACIC ECHOCARDIOGRAM  09/05/2013  dr Aundra Dubin  mild LVH, ef 29-51%, grade 1 diastolic dysfunction/  very mild AV stenosis with mild AR/  trivial MR and PT/ mild to moderate LAE/ mild TR/ mild pulmonary hypertension with PA peak pressure 36mHg  TUBAL LIGATION   HPI: 81y.o. female who presented to ED with 3 day history of  cough, chills and a severe headache. New intermittent episodes of headache on left temple x 3 days prior to hospitalization of note she had similar headaches about 2 months ago which lasted for a few days.  In the hospital she was diagnosed with pneumonia and admitted to the hospital.  Neurology was consulted for headaches. Pt with medical history significant of HTN, HLD, hypothyroidism, OSA, GERD, sinus node dysfunction s/p PPM in 03/2018, bipolar and depression. Caregiver reports hx esophageal dilation x2.  Subjective: awake, cooperative but also confused, asking some repetitive questions  Recommendations for follow up therapy are one component of a multi-disciplinary discharge planning process, led by the attending physician.  Recommendations may be updated based on patient status, additional functional criteria and insurance authorization. Assessment / Plan / Recommendation   07/02/2022   2:00 PM Clinical Impressions Clinical Impression Pt has generally functional oropharyngeal swallowing, but she does appear to have a prominent CP that might partially  interfere with bolus flow but leaving only trace residue within the UES or just above. No aspiration occurs and coughing throughout MBS is not related to airway protection. Of note, the barium tablet stayed briefly in the distal esophagus but did appear to clear with an additional liquid wash (MD not present to confirm). Recommend continuing regular solids and thin liquids as tolerated. SLP Visit Diagnosis Dysphagia, unspecified (R13.10) Impact on safety and function Mild aspiration risk     07/02/2022   2:00 PM Treatment Recommendations Treatment Recommendations No treatment recommended at this time     07/02/2022   2:00 PM Prognosis Prognosis for Safe Diet Advancement Good   07/02/2022   2:00 PM Diet Recommendations SLP Diet Recommendations Regular solids;Thin liquid Liquid Administration via Cup;Straw Medication Administration Whole meds with liquid Compensations Slow rate;Small sips/bites;Minimize environmental distractions Postural Changes Seated upright at 90 degrees;Remain semi-upright after after feeds/meals (Comment)     07/02/2022   2:00 PM Other Recommendations Oral Care Recommendations Oral care BID Follow Up Recommendations No SLP follow up Assistance recommended at discharge PRN Functional Status Assessment Patient has not had a recent decline in their functional status   07/02/2022  11:50 AM Frequency and Duration  Speech Therapy Frequency (ACUTE ONLY) --     07/02/2022   2:00 PM Oral Phase Oral Phase [facility]    07/02/2022   2:00 PM Pharyngeal Phase Pharyngeal Phase [facility]    07/02/2022   2:00 PM Cervical Esophageal Phase  Cervical Esophageal Phase Impaired Thin Cup Reduced cricopharyngeal relaxation;Prominent cricopharyngeal segment Thin Straw Reduced cricopharyngeal relaxation;Prominent cricopharyngeal segment Puree Reduced cricopharyngeal relaxation;Prominent cricopharyngeal segment Regular Reduced cricopharyngeal relaxation;Prominent cricopharyngeal segment Pill Reduced cricopharyngeal relaxation;Prominent  cricopharyngeal segment Osie Bond., M.A. [place] Office [phone] Secure chat preferred 07/02/2022, 3:12 PM                     VAS [name] TEMPORAL ARTERY BILATERAL  Result Date: 07/02/2022  TEMPORAL ARTERY REPORT Patient Name:  LEELAH HANNA  Date of Exam:   07/02/2022 Medical Rec #: [id number]       Accession #:    [id number] Date of Birth: 06/03/1941        Patient Gender: F Patient Age:   60 years Exam Location:  [facility] Procedure:      VAS [name] BILATERAL Referring Phys: MCNEILL KIRKPATRICK --------------------------------------------------------------------------------  Indications: Left sided temporal headache x3 days. High Risk Factors: Age > 62 yrs and female.  Performing Technologist: Archie Patten RVS  Examination Guidelines: Patient  in reclined position. 2D, color and spectral doppler sampling in the temporal artery along the hairline and temple in the longitudinal plane. 2D images along the hairline and temple in the transverse plane. Exam is bilateral.  Summary: Absence of a "halo" sign in the bilateral temporal artery, although not definitive, makes a diagnosis of temporal arteritis unlikely.  *See table(s) above for measurements and observations.  Electronically signed by Antony Contras MD on 07/02/2022 at 2:26:10 PM.   Final    MR Brain W and Wo Contrast  Result Date: 07/02/2022 CLINICAL DATA:  Headache. EXAM: MRI HEAD WITHOUT AND WITH CONTRAST TECHNIQUE: Multiplanar, multiecho pulse sequences of the brain and surrounding structures were obtained without and with intravenous contrast. CONTRAST:  6 mL Vueway COMPARISON:  Head CT, CTA, and CTP 07/01/2022.  Head MRI 11/12/2015. FINDINGS: Postcontrast sequences are mildly motion degraded. Brain: There is no evidence of an acute infarct, intracranial hemorrhage, mass, midline shift, or extra-axial fluid collection. The ventricles and sulci are within normal limits for age. T2 hyperintensities in  the cerebral white matter and pons have progressed from 2017 and are nonspecific but compatible with mild chronic small vessel ischemic disease. No abnormal enhancement is identified. Vascular: Major intracranial vascular flow voids are preserved. Skull and upper cervical spine: Unremarkable bone marrow signal. Sinuses/Orbits: Bilateral cataract extraction. Mucosal thickening and small volume fluid in the right maxillary sinus. Trace left mastoid fluid. Other: None. IMPRESSION: 1. No acute intracranial abnormality. 2. Mild chronic small vessel ischemic disease. Electronically Signed   By: Logan Bores M.D.   On: 07/02/2022 13:37   DG Chest Port 1 View  Result Date: 07/02/2022 CLINICAL DATA:  Headache, cough, pneumonia. EXAM: PORTABLE CHEST 1 VIEW COMPARISON:  07/01/2022 FINDINGS: There is a left chest wall pacer device with lead in the right atrial appendage and right ventricle. Normal heart size. Aortic atherosclerotic calcifications. Persistent airspace disease within the right upper lobe is not significantly changed from previous exam. IMPRESSION: Persistent right upper lobe airspace disease compatible with pneumonia. Electronically Signed   By: Kerby Moors M.D.   On: 07/02/2022 06:28   CT VENOGRAM HEAD  Result Date: 07/01/2022 CLINICAL DATA:  Headache, classic migraine headache; Headache, tension-type EXAM: CT ANGIOGRAPHY HEAD CT VENOGRAM HEAD TECHNIQUE: Multidetector CT imaging of the head was performed using the standard protocol during bolus administration of intravenous contrast. Venographic phase images of the brain were obtained following the administration of intravenous contrast. Multiplanar reformats and maximum intensity projections were generated. RADIATION DOSE REDUCTION: This exam was performed according to the departmental dose-optimization program which includes automated exposure control, adjustment of the mA and/or kV according to patient size and/or use of iterative reconstruction  technique. CONTRAST:  18m OMNIPAQUE IOHEXOL 350 MG/ML SOLN COMPARISON:  Same day CT head. FINDINGS: CTA: Anterior circulation: Bilateral intracranial ICAs, MCAs, and ACAs are patent without proximal hemodynamically significant stenosis. No aneurysm identified. Posterior circulation: Left intradural vertebral artery is non dominant and terminates as PICA, anatomic variant. Right intradural vertebral artery, basilar artery and bilateral posterior cerebral arteries are patent without proximal hemodynamically significant stenosis. No aneurysm identified CTV: No evidence of dural venous sinus thrombosis. The superior sagittal, transverse and sigmoid sinuses are patent. Narrowing of the left transverse sinus. The straight sinus and visualized deep cerebral veins are patent IMPRESSION: 1. No evidence of large vessel occlusion or proximal hemodynamically significant stenosis. No aneurysm identified. 2. No evidence of dural venous sinus thrombosis. Electronically Signed   By: FMargaretha SheffieldM.D.   On:  07/01/2022 15:04   CT Angio Head W or Wo Contrast  Result Date: 07/01/2022 CLINICAL DATA:  Headache, classic migraine headache; Headache, tension-type EXAM: CT ANGIOGRAPHY HEAD CT VENOGRAM HEAD TECHNIQUE: Multidetector CT imaging of the head was performed using the standard protocol during bolus administration of intravenous contrast. Venographic phase images of the brain were obtained following the administration of intravenous contrast. Multiplanar reformats and maximum intensity projections were generated. RADIATION DOSE REDUCTION: This exam was performed according to the departmental dose-optimization program which includes automated exposure control, adjustment of the mA and/or kV according to patient size and/or use of iterative reconstruction technique. CONTRAST:  68m OMNIPAQUE IOHEXOL 350 MG/ML SOLN COMPARISON:  Same day CT head. FINDINGS: CTA: Anterior circulation: Bilateral intracranial ICAs, MCAs, and ACAs  are patent without proximal hemodynamically significant stenosis. No aneurysm identified. Posterior circulation: Left intradural vertebral artery is non dominant and terminates as PICA, anatomic variant. Right intradural vertebral artery, basilar artery and bilateral posterior cerebral arteries are patent without proximal hemodynamically significant stenosis. No aneurysm identified CTV: No evidence of dural venous sinus thrombosis. The superior sagittal, transverse and sigmoid sinuses are patent. Narrowing of the left transverse sinus. The straight sinus and visualized deep cerebral veins are patent IMPRESSION: 1. No evidence of large vessel occlusion or proximal hemodynamically significant stenosis. No aneurysm identified. 2. No evidence of dural venous sinus thrombosis. Electronically Signed   By: FMargaretha SheffieldM.D.   On: 07/01/2022 15:04   CT Head Wo Contrast  Result Date: 07/01/2022 CLINICAL DATA:  Left-sided headache.  Cough. EXAM: CT HEAD WITHOUT CONTRAST TECHNIQUE: Contiguous axial images were obtained from the base of the skull through the vertex without intravenous contrast. RADIATION DOSE REDUCTION: This exam was performed according to the departmental dose-optimization program which includes automated exposure control, adjustment of the mA and/or kV according to patient size and/or use of iterative reconstruction technique. COMPARISON:  05/07/2016. FINDINGS: Brain: No evidence of acute infarction, hemorrhage, hydrocephalus, extra-axial collection or mass lesion/mass effect. Vascular: No hyperdense vessel or unexpected calcification. Skull: Normal. Negative for fracture or focal lesion. Sinuses/Orbits: Visualized globes and orbits are unremarkable. The visualized sinuses are clear. Other: None. IMPRESSION: Normal unenhanced CT scan of the brain. Electronically Signed   By: DLajean ManesM.D.   On: 07/01/2022 12:28   DG Chest 2 View  Result Date: 07/01/2022 CLINICAL DATA:  Headache, cough,  malaise, hypertension EXAM: CHEST - 2 VIEW COMPARISON:  06/12/2018 FINDINGS: Normal heart size, mediastinal contours, and pulmonary vascularity. Atherosclerotic calcification aorta. LEFT subclavian sequential transvenous pacemaker leads project at RIGHT atrium and RIGHT ventricle. Patchy infiltrates identified greatest RIGHT upper lobe consistent with pneumonia. No pleural effusion or pneumothorax. BILATERAL breast prostheses. Bones demineralized. IMPRESSION: RIGHT upper lobe infiltrate consistent with pneumonia. Aortic Atherosclerosis (ICD10-I70.0). Electronically Signed   By: MLavonia DanaM.D.   On: 07/01/2022 10:09    Recent Labs: Lab Results  Component Value Date   WBC 8.7 07/08/2022   HGB 8.6 (L) 07/08/2022   PLT 280 07/08/2022   NA 137 07/08/2022   K 3.8 07/08/2022   CL 109 07/08/2022   CO2 20 (L) 07/08/2022   GLUCOSE 91 07/08/2022   BUN 20 07/08/2022   CREATININE 0.59 07/08/2022   BILITOT 0.2 (L) 07/08/2022   ALKPHOS 69 07/08/2022   AST 257 (H) 07/08/2022   ALT 308 (H) 07/08/2022   PROT 5.3 (L) 07/08/2022   ALBUMIN 2.0 (L) 07/08/2022   CALCIUM 8.5 (L) 07/08/2022   GFRAA 58 (L) 07/10/2020  Speciality Comments: No specialty comments available.  Procedures:  No procedures performed Allergies: Propranolol and Brexpiprazole   Assessment / Plan:     Visit Diagnoses: Elevated sed rate - 07/07/22: ESR 80. 10/2: ANA negative, anti-CCP-, RF 18.2+, ANCA-, dsDNA negative  S/P total right hip arthroplasty  Sinus node dysfunction (HCC)  Coronary artery calcification seen on CAT scan  OSA (obstructive sleep apnea)  Lung nodule  History of gastroesophageal reflux (GERD)  History of hypothyroidism  History of osteoporosis  Overactive bladder  S/P placement of cardiac pacemaker 03/17/18 [facility]   Bipolar affective disorder in remission ([place])  History of hyperlipidemia  Orders: No orders of the defined types were placed in this encounter.  No orders of the defined  types were placed in this encounter.   Face-to-face time spent with patient was *** minutes. Greater than 50% of time was spent in counseling and coordination of care.  Follow-Up Instructions: No follow-ups on file.   Ofilia Neas, PA-C  Note - This record has been created using Dragon software.  Chart creation errors have been sought, but may not always  have been located. Such creation errors do not reflect on  the standard of medical care.

## 2022-07-18 ENCOUNTER — Other Ambulatory Visit (HOSPITAL_BASED_OUTPATIENT_CLINIC_OR_DEPARTMENT_OTHER): Payer: Self-pay

## 2022-07-18 MED FILL — Paroxetine HCl Tab 20 MG: ORAL | 90 days supply | Qty: 90 | Fill #0 | Status: AC

## 2022-07-19 ENCOUNTER — Ambulatory Visit: Payer: Medicare Other | Attending: Rheumatology | Admitting: Rheumatology

## 2022-07-19 ENCOUNTER — Ambulatory Visit (INDEPENDENT_AMBULATORY_CARE_PROVIDER_SITE_OTHER): Payer: Medicare Other | Admitting: Internal Medicine

## 2022-07-19 ENCOUNTER — Encounter: Payer: Self-pay | Admitting: Internal Medicine

## 2022-07-19 ENCOUNTER — Encounter: Payer: Self-pay | Admitting: Rheumatology

## 2022-07-19 ENCOUNTER — Other Ambulatory Visit (HOSPITAL_BASED_OUTPATIENT_CLINIC_OR_DEPARTMENT_OTHER): Payer: Self-pay

## 2022-07-19 VITALS — BP 111/64 | HR 61 | Resp 13 | Ht 58.5 in | Wt 98.0 lb

## 2022-07-19 VITALS — BP 124/60 | HR 60 | Temp 99.7°F

## 2022-07-19 DIAGNOSIS — R7989 Other specified abnormal findings of blood chemistry: Secondary | ICD-10-CM

## 2022-07-19 DIAGNOSIS — Z8739 Personal history of other diseases of the musculoskeletal system and connective tissue: Secondary | ICD-10-CM | POA: Diagnosis not present

## 2022-07-19 DIAGNOSIS — F3181 Bipolar II disorder: Secondary | ICD-10-CM

## 2022-07-19 DIAGNOSIS — R29898 Other symptoms and signs involving the musculoskeletal system: Secondary | ICD-10-CM

## 2022-07-19 DIAGNOSIS — Z950 Presence of cardiac pacemaker: Secondary | ICD-10-CM

## 2022-07-19 DIAGNOSIS — F3170 Bipolar disorder, currently in remission, most recent episode unspecified: Secondary | ICD-10-CM | POA: Diagnosis not present

## 2022-07-19 DIAGNOSIS — J189 Pneumonia, unspecified organism: Secondary | ICD-10-CM

## 2022-07-19 DIAGNOSIS — Z8719 Personal history of other diseases of the digestive system: Secondary | ICD-10-CM | POA: Diagnosis not present

## 2022-07-19 DIAGNOSIS — Z87898 Personal history of other specified conditions: Secondary | ICD-10-CM | POA: Diagnosis not present

## 2022-07-19 DIAGNOSIS — R700 Elevated erythrocyte sedimentation rate: Secondary | ICD-10-CM | POA: Diagnosis not present

## 2022-07-19 DIAGNOSIS — I959 Hypotension, unspecified: Secondary | ICD-10-CM | POA: Diagnosis not present

## 2022-07-19 DIAGNOSIS — Z8639 Personal history of other endocrine, nutritional and metabolic disease: Secondary | ICD-10-CM | POA: Diagnosis not present

## 2022-07-19 DIAGNOSIS — D631 Anemia in chronic kidney disease: Secondary | ICD-10-CM | POA: Diagnosis not present

## 2022-07-19 DIAGNOSIS — Z96641 Presence of right artificial hip joint: Secondary | ICD-10-CM

## 2022-07-19 DIAGNOSIS — I130 Hypertensive heart and chronic kidney disease with heart failure and stage 1 through stage 4 chronic kidney disease, or unspecified chronic kidney disease: Secondary | ICD-10-CM | POA: Diagnosis not present

## 2022-07-19 DIAGNOSIS — R911 Solitary pulmonary nodule: Secondary | ICD-10-CM

## 2022-07-19 DIAGNOSIS — R413 Other amnesia: Secondary | ICD-10-CM

## 2022-07-19 DIAGNOSIS — N3281 Overactive bladder: Secondary | ICD-10-CM | POA: Diagnosis not present

## 2022-07-19 DIAGNOSIS — N182 Chronic kidney disease, stage 2 (mild): Secondary | ICD-10-CM | POA: Diagnosis not present

## 2022-07-19 DIAGNOSIS — I2510 Atherosclerotic heart disease of native coronary artery without angina pectoris: Secondary | ICD-10-CM | POA: Diagnosis not present

## 2022-07-19 DIAGNOSIS — I509 Heart failure, unspecified: Secondary | ICD-10-CM | POA: Diagnosis not present

## 2022-07-19 DIAGNOSIS — E039 Hypothyroidism, unspecified: Secondary | ICD-10-CM | POA: Diagnosis not present

## 2022-07-19 DIAGNOSIS — I495 Sick sinus syndrome: Secondary | ICD-10-CM | POA: Diagnosis not present

## 2022-07-19 DIAGNOSIS — G4489 Other headache syndrome: Secondary | ICD-10-CM | POA: Diagnosis not present

## 2022-07-19 DIAGNOSIS — G4733 Obstructive sleep apnea (adult) (pediatric): Secondary | ICD-10-CM | POA: Diagnosis not present

## 2022-07-19 NOTE — Patient Instructions (Signed)
We were unable to obtain blood today.  We will try again when better hydrated.  Has follow-up with GI and Pulmonary.  Has physical therapy arranged.

## 2022-07-19 NOTE — Progress Notes (Signed)
Subjective:    Patient ID: Meghan Oliver, female    DOB: Feb 07, 1941, 81 y.o.   MRN: [id number]  HPI 81 year old Female seen for hospitalization follow up.  Patient was admitted to the hospital on September 28 with a temporal headache, cough, and chills.  She was diagnosed with pneumonia and was admitted to the hospital.  Neurology was consulted for headaches.  Her sed rate was elevated.  Vascular surgery was consulted for temporal artery biopsy.  This proved to be negative for giant cell arteritis.  She was started on steroids initially with headache and consideration of possible giant cell arteritis but steroids were subsequently tapered.  Liver functions were elevated.  Autoimmune work-up was negative.  She was seen by pulmonary critical care physician and had thoracentesis done.  Was treated with IV Rocephin and azithromycin orally.  She has Pulmonary follow-up and repeat CT of chest planned.  Had asymptomatic transaminitis.  Ultrasound right upper quadrant was negative.  Anti-smooth muscle antibody and antimitochondrial antibodies were negative.  Hepatitis panel was negative.  She did have an elevated troponin but had no chest pain or EKG changes.  Was not thought to have acute coronary syndrome.  Past medical history: History of macrocytic anemia.  Hemoglobin decreased to 9.7 after hip surgery in August.  B12 level stable.  History of bipolar disorder and depression treated with ECT in the past.  Currently followed by [name].  History of memory loss treated with Namenda.  History of sinus node dysfunction with [facility] Jude permanent pacemaker placed in 2019.  Hypothyroidism treated with thyroid replacement medication  Hyperlipidemia treated with Crestor.  Felt to have possible SIADH and sodium was low during hospitalization.  This improved during the hospitalization.  History of chronic constipation.  Had colonoscopy in 2018 in [place] when she was having severe constipation.  1  noncancerous polyp was found.  Had upper endoscopy by Dr. Carlean Purl in February 2022.  Had hiatal hernia and esophageal stricture that was dilated.  History of GE reflux treated with PPI.  History of obstructive sleep apnea but does not use CPAP  Social history: She and her husband are now residing at [facility].  Review of Systems she is here today with [name] Card,R.N.     Objective:   Physical Exam She is ambulatory.  She seems slightly weak but overall seems pretty stable.  Blood pressure 124/60 pulse 60 regular temperature 99.7 degrees.  Pulse oximetry 98% on room air. Her chest is clear.  Cardiac exam: Regular rate and rhythm.  No lower extremity pitting edema.  Affect appears to be normal.  We attempted to draw  follow-up labs today but phlebotomist was unable to get blood.  She may have been slightly volume depleted.  She will return another day for these labs.      Assessment & Plan:   Admitted for headache, cough, malaise.  Was found to have patchy infiltrates greatest in the right upper lobe consistent with pneumonia.  Has pulmonary follow-up.  Had thoracentesis.  CT chest showed right upper lobe and right lower lobe infiltrates suspicious for multilobular bilateral bronchopneumonia.  Has pulmonary follow-up arranged.  Headache-temporal arteritis ruled out with biopsy.  Improved and patient no longer has headache  History of bipolar disorder seen by [name]  Elevated transaminases on October 5-needs to be followed  Deconditioning with low total protein and albumin.  Will be getting physical therapy.  Caloric intake needs to be monitored.   Plan: Try tomorrow  for follow-up labs when well-hydrated.

## 2022-07-20 ENCOUNTER — Other Ambulatory Visit: Payer: Medicare Other

## 2022-07-20 ENCOUNTER — Ambulatory Visit
Admission: RE | Admit: 2022-07-20 | Discharge: 2022-07-20 | Disposition: A | Discharge status: Home / Self Care | Payer: Medicare Other | Source: Ambulatory Visit | Attending: Internal Medicine | Admitting: Internal Medicine

## 2022-07-20 ENCOUNTER — Other Ambulatory Visit (HOSPITAL_BASED_OUTPATIENT_CLINIC_OR_DEPARTMENT_OTHER): Payer: Self-pay

## 2022-07-20 DIAGNOSIS — E7800 Pure hypercholesterolemia, unspecified: Secondary | ICD-10-CM | POA: Diagnosis not present

## 2022-07-20 DIAGNOSIS — E039 Hypothyroidism, unspecified: Secondary | ICD-10-CM | POA: Diagnosis not present

## 2022-07-20 DIAGNOSIS — R531 Weakness: Secondary | ICD-10-CM | POA: Diagnosis not present

## 2022-07-20 DIAGNOSIS — R918 Other nonspecific abnormal finding of lung field: Secondary | ICD-10-CM | POA: Diagnosis not present

## 2022-07-20 DIAGNOSIS — I959 Hypotension, unspecified: Secondary | ICD-10-CM | POA: Diagnosis not present

## 2022-07-20 DIAGNOSIS — J189 Pneumonia, unspecified organism: Secondary | ICD-10-CM | POA: Diagnosis not present

## 2022-07-20 DIAGNOSIS — R413 Other amnesia: Secondary | ICD-10-CM

## 2022-07-20 DIAGNOSIS — F3181 Bipolar II disorder: Secondary | ICD-10-CM

## 2022-07-20 DIAGNOSIS — I700 Atherosclerosis of aorta: Secondary | ICD-10-CM | POA: Diagnosis not present

## 2022-07-20 DIAGNOSIS — M419 Scoliosis, unspecified: Secondary | ICD-10-CM | POA: Diagnosis not present

## 2022-07-20 DIAGNOSIS — Z8701 Personal history of pneumonia (recurrent): Secondary | ICD-10-CM | POA: Diagnosis not present

## 2022-07-20 NOTE — Progress Notes (Signed)
Remote pacemaker transmission.   

## 2022-07-21 ENCOUNTER — Telehealth: Payer: Self-pay | Admitting: Internal Medicine

## 2022-07-21 DIAGNOSIS — G4489 Other headache syndrome: Secondary | ICD-10-CM | POA: Diagnosis not present

## 2022-07-21 DIAGNOSIS — I509 Heart failure, unspecified: Secondary | ICD-10-CM | POA: Diagnosis not present

## 2022-07-21 DIAGNOSIS — I130 Hypertensive heart and chronic kidney disease with heart failure and stage 1 through stage 4 chronic kidney disease, or unspecified chronic kidney disease: Secondary | ICD-10-CM | POA: Diagnosis not present

## 2022-07-21 DIAGNOSIS — N182 Chronic kidney disease, stage 2 (mild): Secondary | ICD-10-CM | POA: Diagnosis not present

## 2022-07-21 DIAGNOSIS — D631 Anemia in chronic kidney disease: Secondary | ICD-10-CM | POA: Diagnosis not present

## 2022-07-21 DIAGNOSIS — J189 Pneumonia, unspecified organism: Secondary | ICD-10-CM | POA: Diagnosis not present

## 2022-07-21 LAB — TSH: TSH: 1.58 mIU/L (ref 0.40–4.50)

## 2022-07-21 LAB — CBC WITH DIFFERENTIAL/PLATELET
Absolute Monocytes: 648 cells/uL (ref 200–950)
Basophils Absolute: 40 cells/uL (ref 0–200)
Basophils Relative: 0.5 %
Eosinophils Absolute: 237 cells/uL (ref 15–500)
Eosinophils Relative: 3 %
HCT: 28.8 % — ABNORMAL LOW (ref 35.0–45.0)
Hemoglobin: 10 g/dL — ABNORMAL LOW (ref 11.7–15.5)
Lymphs Abs: 1588 cells/uL (ref 850–3900)
MCH: 32.2 pg (ref 27.0–33.0)
MCHC: 34.7 g/dL (ref 32.0–36.0)
MCV: 92.6 fL (ref 80.0–100.0)
MPV: 10.4 fL (ref 7.5–12.5)
Monocytes Relative: 8.2 %
Neutro Abs: 5388 cells/uL (ref 1500–7800)
Neutrophils Relative %: 68.2 %
Platelets: 352 10*3/uL (ref 140–400)
RBC: 3.11 10*6/uL — ABNORMAL LOW (ref 3.80–5.10)
RDW: 13.1 % (ref 11.0–15.0)
Total Lymphocyte: 20.1 %
WBC: 7.9 10*3/uL (ref 3.8–10.8)

## 2022-07-21 LAB — COMPLETE METABOLIC PANEL WITH GFR
AG Ratio: 1.3 (calc) (ref 1.0–2.5)
ALT: 73 U/L — ABNORMAL HIGH (ref 6–29)
AST: 58 U/L — ABNORMAL HIGH (ref 10–35)
Albumin: 3.6 g/dL (ref 3.6–5.1)
Alkaline phosphatase (APISO): 70 U/L (ref 37–153)
BUN/Creatinine Ratio: 32 (calc) — ABNORMAL HIGH (ref 6–22)
BUN: 26 mg/dL — ABNORMAL HIGH (ref 7–25)
CO2: 25 mmol/L (ref 20–32)
Calcium: 9.4 mg/dL (ref 8.6–10.4)
Chloride: 106 mmol/L (ref 98–110)
Creat: 0.82 mg/dL (ref 0.60–0.95)
Globulin: 2.8 g/dL (calc) (ref 1.9–3.7)
Glucose, Bld: 86 mg/dL (ref 65–99)
Potassium: 4.6 mmol/L (ref 3.5–5.3)
Sodium: 138 mmol/L (ref 135–146)
Total Bilirubin: 0.4 mg/dL (ref 0.2–1.2)
Total Protein: 6.4 g/dL (ref 6.1–8.1)
eGFR: 71 mL/min/{1.73_m2} (ref >=60)

## 2022-07-21 LAB — SEDIMENTATION RATE: Sed Rate: 36 mm/h — ABNORMAL HIGH (ref 0–30)

## 2022-07-21 NOTE — Telephone Encounter (Signed)
Faxed completed signed certified orders to [facility] [phone], phone [phone]  Order # [id number] Certification 94/37/0052 to 09/06/2022

## 2022-07-21 NOTE — Telephone Encounter (Signed)
This message was sent via [place], a product from [employer] [email or web address].[email or web address]/                    -------Fax Transmission Report-------  To:               Recipient at [phone] Subject:          FW: Hp Scans Result:           The transmission was successful. Explanation:      All Pages Ok Pages Sent:       9 Connect Time:     6 minutes, 45 seconds Transmit Time:    07/21/2022 15:30 Transfer Rate:    14400 Status Code:      0000 Retry Count:      0 Job Id:           [id number] Unique Id:        [id number] Fax Line:         74 Fax Server:       [id number]

## 2022-07-22 ENCOUNTER — Encounter: Payer: Self-pay | Admitting: Pulmonary Disease

## 2022-07-22 ENCOUNTER — Ambulatory Visit (INDEPENDENT_AMBULATORY_CARE_PROVIDER_SITE_OTHER): Payer: Medicare Other | Admitting: Pulmonary Disease

## 2022-07-22 VITALS — BP 118/60 | HR 68 | Temp 98.1°F | Ht 58.5 in | Wt 95.4 lb

## 2022-07-22 DIAGNOSIS — J189 Pneumonia, unspecified organism: Secondary | ICD-10-CM

## 2022-07-22 DIAGNOSIS — D631 Anemia in chronic kidney disease: Secondary | ICD-10-CM | POA: Diagnosis not present

## 2022-07-22 DIAGNOSIS — G4489 Other headache syndrome: Secondary | ICD-10-CM | POA: Diagnosis not present

## 2022-07-22 DIAGNOSIS — N182 Chronic kidney disease, stage 2 (mild): Secondary | ICD-10-CM | POA: Diagnosis not present

## 2022-07-22 DIAGNOSIS — I2510 Atherosclerotic heart disease of native coronary artery without angina pectoris: Secondary | ICD-10-CM

## 2022-07-22 DIAGNOSIS — I509 Heart failure, unspecified: Secondary | ICD-10-CM | POA: Diagnosis not present

## 2022-07-22 DIAGNOSIS — I130 Hypertensive heart and chronic kidney disease with heart failure and stage 1 through stage 4 chronic kidney disease, or unspecified chronic kidney disease: Secondary | ICD-10-CM | POA: Diagnosis not present

## 2022-07-22 DIAGNOSIS — J849 Interstitial pulmonary disease, unspecified: Secondary | ICD-10-CM | POA: Diagnosis not present

## 2022-07-22 NOTE — Telephone Encounter (Signed)
Meghan Oliver is calling you back because the patient is not able to attend on 08/30/22 so they need another appt.

## 2022-07-22 NOTE — Patient Instructions (Signed)
I am glad you are feeling better The chest x-ray from yesterday shows improving pneumonia which is good news We will get follow-up CT high-resolution in 3 months.  Return to clinic in 3 months after scan.

## 2022-07-22 NOTE — Progress Notes (Signed)
Meghan Oliver    [id number]    1941-04-28  Primary Care Physician:Baxley, Cresenciano Lick, MD  Referring Physician: [name], MD [address] [place],  [place] [address]  Chief complaint: Follow-up after hospitalization for pneumonia  HPI: 81 y.o. who  has a past medical history of Anxiety, Arthritis, Bipolar II disorder ([place]), CHF (congestive heart failure) ([place]), Chronic bronchitis ([place]), Chronic lower back pain, Chronic right hip pain, CKD (chronic kidney disease), stage II, Coronary artery disease, Esophagitis, erosive, GAD (generalized anxiety disorder), GERD (gastroesophageal reflux disease), Headache, Heart murmur, systolic, History of adenomatous polyp of colon, History of blood transfusion (12/2017), History of electroconvulsive therapy, History of hiatal hernia, Hyperlipidemia, Hypertension, Hypothyroidism, Internal carotid artery stenosis, bilateral, Major depression, chronic, Memory loss, Migraines, OSA (obstructive sleep apnea), Osteoporosis, Poor historian, Presence of permanent cardiac pacemaker (03/17/2018), Pulmonary nodule, S/P placement of cardiac pacemaker 03/17/18 [facility]  (03/18/2018), Short-term memory loss, and Sick sinus syndrome ([place]).   She was hospitalized on 07/01/2022 with temporal headaches, dry cough and chills with chest x-ray showing right lung infiltrate.  She was treated with azithromycin and ceftriaxone and steroids.  Due to concern for elevated sed rate a question of temporal arteritis was entertained and vascular surgery consulted for temporal artery biopsy.  PCCM consulted on 10/3 for worsening lung infiltrates and to evaluate possible vasculitis related interstitial lung disease.  On review of her CT scan the findings are felt to be more consistent with pneumonia.  Her CTD serologies were negative as was the temporal artery biopsy.  She was placed on steroids which was weaned off in a few days Underwent right-sided thoracentesis which showed  parapneumonic effusion.  Post discharge on 07/08/2022 she continues to improve.  She subsequently had a follow-up with Dr. Estanislado Pandy who felt this no evidence of rheumatologic condition or temporal arteritis.  Pets: No pets Occupation: Retired Exposures: No known exposures Smoking history: 30-pack-year smoker Travel history: No significant travel history Relevant family history: No family history of lung disease   Outpatient Encounter Medications as of 07/22/2022  Medication Sig   aspirin 81 MG chewable tablet Chew 1 tablet (81 mg total) by mouth daily.   Calcium Carbonate-Vitamin D (CALCIUM 600/VITAMIN D PO) Take 1 tablet by mouth 2 (two) times daily.   cetirizine (ZYRTEC) 10 MG tablet Take 10 mg by mouth daily as needed for allergies.   Cholecalciferol (VITAMIN D3) 50 MCG (2000 UT) TABS Take 2,000 Units by mouth in the morning.   dexlansoprazole (DEXILANT) 60 MG capsule Take 1 capsule by mouth daily (Patient taking differently: Take 60 mg by mouth daily.)   fesoterodine (TOVIAZ) 8 MG TB24 tablet Take 1 tablet (8 mg total) by mouth daily.   Krill Oil 500 MG CAPS Take 500 mg by mouth daily.   Lavender Oil 80 MG CAPS Take 160 mg by mouth at bedtime.   levothyroxine (SYNTHROID) 75 MCG tablet Take 1 tablet (75 mcg total) by mouth daily.   linaclotide (LINZESS) 72 MCG capsule Take 1 capsule (72 mcg total) by mouth in the morning and at bedtime.   lithium carbonate 300 MG capsule Take 1 capsule (300 mg total) by mouth at bedtime.   LORazepam (ATIVAN) 0.5 MG tablet TAKE 1/2 TO 1 TABLET BY MOUTH EVERY NIGHT AT BEDTIME AND 1 TABLET EVERY 8 HOURS AS NEEDED FOR ANXIETY (Patient taking differently: Take 0.25-0.5 mg by mouth See admin instructions. TAKE 1/2 TO 1 TABLET BY MOUTH EVERY NIGHT AT  BEDTIME AND 1 TABLET EVERY 8 HOURS AS NEEDED FOR ANXIETY)   magic mouthwash w/lidocaine SOLN Take 5 mLs by mouth 3 (three) times daily as needed for mouth pain.   memantine (NAMENDA) 10 MG tablet Take 1 tablet (10  mg total) by mouth 2 (two) times daily.   mirtazapine (REMERON) 7.5 MG tablet Take 1 tablet (7.5 mg total) by mouth at bedtime.   Multiple Vitamins-Minerals (MULTIVITAMIN WITH MINERALS) tablet Take 1 tablet by mouth daily.   Multiple Vitamins-Minerals (PRESERVISION AREDS 2 PO) Take 1 capsule by mouth in the morning and at bedtime.   naloxegol oxalate (MOVANTIK) 12.5 MG TABS tablet Take 2 tablets (25 mg total) by mouth daily. Start with 1 tablet daily and if tolerating well for a week increase to 2 tablets daily   nortriptyline (PAMELOR) 75 MG capsule Take 1 capsule (75 mg total) by mouth at bedtime.   ondansetron (ZOFRAN-ODT) 4 MG disintegrating tablet Place 1 tablet every 6 hours by translingual route as needed.   PARoxetine (PAXIL) 20 MG tablet Take 1 tablet (20 mg total) by mouth at bedtime.   polyethylene glycol (MIRALAX / GLYCOLAX) 17 g packet Take 17 g by mouth daily as needed for mild constipation.   primidone (MYSOLINE) 50 MG tablet TAKE 2 TABLETS BY MOUTH EVERY MORNING AND 1 TABLET EVERY EVENING   Pyridoxine HCl (VITAMIN B-6) 500 MG tablet Take 500 mg by mouth 2 (two) times daily.   sennosides-docusate sodium (SENOKOT-S) 8.6-50 MG tablet Take 2 tablets by mouth as needed.   traMADol (ULTRAM) 50 MG tablet Take 1-2 tablets (50-100 mg total) by mouth every 6 (six) hours as needed for moderate pain or severe pain.   valACYclovir (VALTREX) 1000 MG tablet Take 2 tablets by mouth at onset, then 2 tablets 12 hours later (Patient taking differently: Take 2,000 mg by mouth as directed.)   vitamin B-12 (CYANOCOBALAMIN) 1000 MCG tablet Take 1,000 mcg by mouth at bedtime.   [DISCONTINUED] benzonatate (TESSALON) 100 MG capsule Take 1 capsule (100 mg total) by mouth 3 (three) times daily as needed for cough.   [DISCONTINUED] guaiFENesin-codeine 100-10 MG/5ML syrup Take 10 mLs by mouth every 12 (twelve) hours as needed for cough.   [DISCONTINUED] methocarbamol (ROBAXIN) 500 MG tablet Take 1 tablet (500 mg  total) by mouth every 6 (six) hours as needed for muscle spasms.   [DISCONTINUED] primidone (MYSOLINE) 50 MG tablet Take 2 tablets by mouth every morning and take 1 tablet by mouth every evening   No facility-administered encounter medications on file as of 07/22/2022.    Allergies as of 07/22/2022 - Review Complete 07/22/2022  Allergen Reaction Noted   Propranolol Other (See Comments) 08/06/2013   Brexpiprazole Other (See Comments) 11/27/2015    Past Medical History:  Diagnosis Date   Anxiety    Arthritis    "hips, spine" (03/17/2018)   Bipolar II disorder (HCC)    CHF (congestive heart failure) (HCC)    Chronic bronchitis (H[facility])    Chronic lower back pain    Chronic right hip pain    CKD (chronic kidney disease), stage II    Coronary artery disease    stent x1   Esophagitis, erosive    GAD (generalized anxiety disorder)    GERD (gastroesophageal reflux disease)    Headache    "maybe monthly" (03/17/2018))   Heart murmur, systolic    History of adenomatous polyp of colon    08-04-2016  tubular adenoma   History of blood transfusion 12/2017   "  related to vascular hematoma"   History of electroconvulsive therapy    at [facility]--  started 04-15-2015 to 11-17-2016  total greater than 40 times   History of hiatal hernia    Hyperlipidemia    Hypertension    Hypothyroidism    Internal carotid artery stenosis, bilateral    per last duplex 05-01-2014  bilateral ICA 40-59%   Major depression, chronic    ECT treatments extensive and multiple started 07/ 2016   Memory loss    "both short and long-term; needs frequent reminders to follow instrucitons" (05/16/2017)   Migraines    "none in years" (03/17/2018)   OSA (obstructive sleep apnea)    per study 06/ 2012 moderate OSA  ; "refuses to wear masks" (03/17/2018)   Osteoporosis    Poor historian    due to short term memory loss   Presence of permanent cardiac pacemaker 03/17/2018   Pulmonary nodule    monitored by pcp   S/P  placement of cardiac pacemaker 03/17/18 [facility]  03/18/2018   Short-term memory loss    Sick sinus syndrome [facility])     Past Surgical History:  Procedure Laterality Date   APPENDECTOMY  1971   ARTERY BIOPSY Left 07/05/2022   Procedure: BIOPSY TEMPORAL ARTERY;  Surgeon: Broadus John, MD;  Location: [facility] OR;  Service: Vascular;  Laterality: Left;   AUGMENTATION MAMMAPLASTY Bilateral    CARDIOVASCULAR STRESS TEST  01-30-2015  dr Aundra Dubin   Low risk nuclear study w/ no evidence ischemia or infarction/  normal LV funciton and wall motion , 76%   COLONOSCOPY W/ BIOPSIES AND POLYPECTOMY  "multiple"   COLONOSCOPY WITH ESOPHAGOGASTRODUODENOSCOPY (EGD)  last one 08-04-2016   CORONARY ANGIOPLASTY WITH STENT PLACEMENT  05/16/2017   "LAD"   CORONARY STENT INTERVENTION N/A 05/16/2017   Procedure: CORONARY STENT INTERVENTION;  Surgeon: Burnell Blanks, MD;  Location: [place] [facility];  Service: Cardiovascular;  Laterality: N/A;   ESOPHAGOGASTRODUODENOSCOPY  02-26-04   ESOPHAGOGASTRODUODENOSCOPY  "multiple"   INSERT / REPLACE / REMOVE PACEMAKER  03/17/2018   IR THORACENTESIS ASP PLEURAL SPACE W/IMG GUIDE  07/07/2022   LEFT HEART CATH AND CORONARY ANGIOGRAPHY N/A 05/16/2017   Procedure: LEFT HEART CATH AND CORONARY ANGIOGRAPHY;  Surgeon: Larey Dresser, MD;  Location: [place] [facility];  Service: Cardiovascular;  Laterality: N/A;   OVARIAN CYST SURGERY  1970s   Laparotomy    PACEMAKER IMPLANT N/A 03/17/2018   Procedure: PACEMAKER IMPLANT;  Surgeon: Evans Lance, MD;  Location: [place] [facility];  Service: Cardiovascular;  Laterality: N/A;   PORT-A-CATH PLACEMENT  05/31/2016; 2018   "@ [facility]; for ECT series"; "@ [facility] also"   PORT-A-CATH REMOVAL N/A 03/11/2017   Procedure: REMOVAL PORT-A-CATH;  Surgeon: Jackolyn Confer, MD;  Location: [facility];  Service: General;  Laterality: N/A;   PORTA CATH REMOVAL  03/17/2018   PORTA CATH REMOVAL  03/17/2018   Procedure: PORTA CATH REMOVAL;   Surgeon: Evans Lance, MD;  Location: [place] [facility] LAB;  Service: Cardiovascular;;   TOTAL HIP ARTHROPLASTY Right 05/18/2022   Procedure: TOTAL HIP ARTHROPLASTY ANTERIOR APPROACH;  Surgeon: Paralee Cancel, MD;  Location: [facility] ORS;  Service: Orthopedics;  Laterality: Right;   TRANSTHORACIC ECHOCARDIOGRAM  09/05/2013  dr Aundra Dubin   mild LVH, ef 64-40%, grade 1 diastolic dysfunction/  very mild AV stenosis with mild AR/  trivial MR and PT/ mild to moderate LAE/ mild TR/ mild pulmonary hypertension with PA peak pressure 53mHg   TUBAL  LIGATION      Family History  Problem Relation Age of Onset   Heart attack Father 20       deceased   Hypertension Father    Heart disease Father    Dementia Brother    Parkinson's disease Brother    Heart disease Brother    Breast cancer Paternal Aunt        Age 78's   Breast cancer Paternal Grandmother        Age unknown   Colon cancer Neg Hx     Social History   Socioeconomic History   Marital status: Married    Spouse name: Dr. Lyla Son   Number of children: 2   Years of education: Not on file   Highest education level: Not on file  Occupational History   Occupation: housewife    Employer: UNEMPLOYED  Tobacco Use   Smoking status: Former    Packs/day: 2.00    Years: 15.00    Total pack years: 30.00    Types: Cigarettes    Quit date: 01/13/1971    Years since quitting: 51.5    Passive exposure: Never   Smokeless tobacco: Never  Vaping Use   Vaping Use: Never used  Substance and Sexual Activity   Alcohol use: Yes    Comment: occassional 1 x a week   Drug use: Never   Sexual activity: Not Currently    Comment: intercourse age 65, sexual partners less than 5  Other Topics Concern   Not on file  Social History Narrative   Not on file   Social Determinants of Health   Financial Resource Strain: Not on file  Food Insecurity: No Food Insecurity (07/06/2022)   Hunger Vital Sign    Worried About Running Out of Food in the Last Year:  Never true    Ran Out of Food in the Last Year: Never true  Transportation Needs: No Transportation Needs (07/06/2022)   PRAPARE - Hydrologist (Medical): No    Lack of Transportation (Non-Medical): No  Physical Activity: Not on file  Stress: Not on file  Social Connections: Not on file  Intimate Partner Violence: Not At Risk (07/06/2022)   Humiliation, Afraid, Rape, and Kick questionnaire    Fear of Current or Ex-Partner: No    Emotionally Abused: No    Physically Abused: No    Sexually Abused: No    Review of systems: Review of Systems  Constitutional: Negative for fever and chills.  HENT: Negative.   Eyes: Negative for blurred vision.  Respiratory: as per HPI  Cardiovascular: Negative for chest pain and palpitations.  Gastrointestinal: Negative for vomiting, diarrhea, blood per rectum. Genitourinary: Negative for dysuria, urgency, frequency and hematuria.  Musculoskeletal: Negative for myalgias, back pain and joint pain.  Skin: Negative for itching and rash.  Neurological: Negative for dizziness, tremors, focal weakness, seizures and loss of consciousness.  Endo/Heme/Allergies: Negative for environmental allergies.  Psychiatric/Behavioral: Negative for depression, suicidal ideas and hallucinations.  All other systems reviewed and are negative.  Physical Exam: Blood pressure 118/60, pulse 68, temperature 98.1 F (36.7 C), temperature source Oral, height 4' 10.5" (1.486 m), weight 95 lb 6.4 oz (43.3 kg), SpO2 98 %. Gen:      No acute distress HEENT:  EOMI, sclera anicteric Neck:     No masses; no thyromegaly Lungs:    Clear to auscultation bilaterally; normal respiratory effort CV:         Regular rate and  rhythm; no murmurs Abd:      + bowel sounds; soft, non-tender; no palpable masses, no distension Ext:    No edema; adequate peripheral perfusion Skin:      Warm and dry; no rash Neuro: alert and oriented x 3 Psych: normal mood and  affect  Data Reviewed: Imaging: High-resolution CT 07/05/2022-moderate effusion left greater than right, patchy airspace consolidation most severe in the right upper and lower lobes.  Chest x-ray 07/20/2022-improving right lung opacities. I have reviewed the images personally.  PFTs: 08/12/2011 FEV1 1.92 [105%], F/F 79 Normal spirometry  Labs: CTD serologies 08/15/2022-positive only for rheumatoid factor of 18.2  Assessment:  Community-acquired pneumonia She is recovering well after recent hospitalization for community-acquired pneumonia.  CT scan during admission and follow-up chest x-ray this week reviewed with improving right lung opacities.  We will continue to follow this  I do not see any evidence of underlying interstitial lung disease or concern for vasculitis.  Order high-resolution CT in 3 months for reevaluation.  Plan/Recommendations: High-resolution CT in 3 months  Marshell Garfinkel MD [facility] 07/22/2022, 1:32 PM  CC: [name], MD

## 2022-07-23 DIAGNOSIS — I130 Hypertensive heart and chronic kidney disease with heart failure and stage 1 through stage 4 chronic kidney disease, or unspecified chronic kidney disease: Secondary | ICD-10-CM | POA: Diagnosis not present

## 2022-07-23 DIAGNOSIS — G4489 Other headache syndrome: Secondary | ICD-10-CM | POA: Diagnosis not present

## 2022-07-23 DIAGNOSIS — D631 Anemia in chronic kidney disease: Secondary | ICD-10-CM | POA: Diagnosis not present

## 2022-07-23 DIAGNOSIS — J189 Pneumonia, unspecified organism: Secondary | ICD-10-CM | POA: Diagnosis not present

## 2022-07-23 DIAGNOSIS — N182 Chronic kidney disease, stage 2 (mild): Secondary | ICD-10-CM | POA: Diagnosis not present

## 2022-07-23 DIAGNOSIS — I509 Heart failure, unspecified: Secondary | ICD-10-CM | POA: Diagnosis not present

## 2022-07-26 ENCOUNTER — Other Ambulatory Visit (HOSPITAL_BASED_OUTPATIENT_CLINIC_OR_DEPARTMENT_OTHER): Payer: Self-pay

## 2022-07-26 ENCOUNTER — Encounter: Payer: Self-pay | Admitting: Psychiatry

## 2022-07-26 ENCOUNTER — Ambulatory Visit (INDEPENDENT_AMBULATORY_CARE_PROVIDER_SITE_OTHER): Payer: Medicare Other | Admitting: Psychiatry

## 2022-07-26 DIAGNOSIS — F3181 Bipolar II disorder: Secondary | ICD-10-CM

## 2022-07-26 DIAGNOSIS — Z79899 Other long term (current) drug therapy: Secondary | ICD-10-CM | POA: Diagnosis not present

## 2022-07-26 DIAGNOSIS — F3175 Bipolar disorder, in partial remission, most recent episode depressed: Secondary | ICD-10-CM | POA: Diagnosis not present

## 2022-07-26 DIAGNOSIS — I2510 Atherosclerotic heart disease of native coronary artery without angina pectoris: Secondary | ICD-10-CM

## 2022-07-26 DIAGNOSIS — J189 Pneumonia, unspecified organism: Secondary | ICD-10-CM | POA: Diagnosis not present

## 2022-07-26 DIAGNOSIS — G3184 Mild cognitive impairment, so stated: Secondary | ICD-10-CM | POA: Diagnosis not present

## 2022-07-26 DIAGNOSIS — G250 Essential tremor: Secondary | ICD-10-CM

## 2022-07-26 DIAGNOSIS — F061 Catatonic disorder due to known physiological condition: Secondary | ICD-10-CM | POA: Diagnosis not present

## 2022-07-26 DIAGNOSIS — G4489 Other headache syndrome: Secondary | ICD-10-CM | POA: Diagnosis not present

## 2022-07-26 DIAGNOSIS — N182 Chronic kidney disease, stage 2 (mild): Secondary | ICD-10-CM | POA: Diagnosis not present

## 2022-07-26 DIAGNOSIS — F411 Generalized anxiety disorder: Secondary | ICD-10-CM | POA: Diagnosis not present

## 2022-07-26 DIAGNOSIS — G251 Drug-induced tremor: Secondary | ICD-10-CM | POA: Diagnosis not present

## 2022-07-26 DIAGNOSIS — R7989 Other specified abnormal findings of blood chemistry: Secondary | ICD-10-CM

## 2022-07-26 DIAGNOSIS — I509 Heart failure, unspecified: Secondary | ICD-10-CM | POA: Diagnosis not present

## 2022-07-26 DIAGNOSIS — I130 Hypertensive heart and chronic kidney disease with heart failure and stage 1 through stage 4 chronic kidney disease, or unspecified chronic kidney disease: Secondary | ICD-10-CM | POA: Diagnosis not present

## 2022-07-26 DIAGNOSIS — D631 Anemia in chronic kidney disease: Secondary | ICD-10-CM | POA: Diagnosis not present

## 2022-07-26 MED ORDER — MIRTAZAPINE 15 MG PO TABS
15.0000 mg | ORAL_TABLET | Freq: Every day | ORAL | 1 refills | Status: DC
  Filled 2022-07-26: qty 90, 90d supply, fill #0
  Filled 2022-10-21: qty 90, 90d supply, fill #1

## 2022-07-26 NOTE — Progress Notes (Signed)
OLUKEMI PANCHAL [id number] October 26, 1940 81 y.o.  Subjective:   Patient ID:  Meghan Oliver is a 81 y.o. (DOB 01/18/1941) female. Patient was last seen August 21, 2018 Chief Complaint:  Chief Complaint  Patient presents with   Follow-up    Bipolar II disorder [facility])   Anxiety     Kathaleen Bury presents to the office today for follow-up of Severe TR bipolar depression with psychotic features including catatonia.  11/30/2018 was the last visit and the following was noted: Had ECT yesterday at [facility].  Last was Jan 8 and was doing well then.  Next scheduled April 15.  Had a lithium level from there which is pending. Pretty good until the last 10 days to 2 weeks with less energy and motivation.  Bothered by old sick dog.  Worried about how that will affect her.  Has slept with the dog.  Enjoyed [place].  Going to gym and playing [place].  Took the class and didn't feel she caught on quickly. Wonders if it is bc of the ECT memory effects.  Bridge is harder and not playing.  Planning to return to [facility] mid April.   spent the winter in [place] per usual..  No significant depression since here. ECT frequency about 6 weeks.  Gets very anxious the day of the ECT.  ECT consistently for a year.  Best in mood in 7-8 years.  Also the pacemaker helped.  Friends notice the benefit.  Plan:  Option increase in the nortriptyline if needed.  Considered this.  They agree and would like to increase nortriptyline to 75 to try to reduce tendency to depression before the ECT.   03/19/19  Addendum: Here the contents from an email from the nurse for Ms. Frett  Hello [name],   I hope you and your family are healthy and well. I took Velora to [facility] for ECT and here are labs from 01/26/19. I am going to also forward them to [facility] for follow up with her LFT's. They are still higher than I would like.  Her lithium level was 0.51 on a daily dose of '300mg'$  qohs x 4 nights- alternating with '450mg'$  qohs x 3 nights. She is not  demonstrating any signs of elevated levels, minimal hand tremors, less anxiety and restlessness since her ECT treatment.  Her Nortryptyline level was 140 on '75mg'$  qhs.  I would like to continue her current dosages of both the above meds and recheck her lithium level at her next scheduled ECT of 03/23/19.  Please let me know of any changes you would like to make. Take care, [name] RN Lithium level was 0.4 on the '450mg'$  alt with 300 mg QOD. No changes were made in meds.  11/15/2019 phone call: Telephone call from patient's husband [name] on 11/14/2019  Patient is scheduled for ECT soon for maintenance ECT.  He thinks it is been 2-1/2 months since the last ECT but he is going to check the date for sure.  It is still be done being done at [facility].  He is wondering about skipping this treatment because the patient has continued to be free of depression and seems cognitively clearer as these ECT treatments have been spread out further from each other.  Patient remains on medications as prescribed.  If it is truly been over 2 months since the last ECT then it is reasonable to consider discontinuing ECT.  It is unlikely that ECT with the frequency of greater  than 2 months is significantly helpful at preventing her relapse.  If however the ECT frequency is less than every 2 months it could still be helping to prevent recurrence.  He indicated he would consider this information and discuss it with her ECT team and make a decision.  They are heading to [place] in the next few weeks.  Needs to schedule an appointment with me for follow-up because it is been many months.  He agrees.   02/19/2020 appt, the following noted: Moving to [place] in a few months.  Ready to downsize.   Stopped ECT as discussed and has not been more deprressed.   Walks dog 4 times daily.   Lithium 0.7 on  01/30/20 on lithium 300 mg plus 150 mg on M, W, F Nortriptyline 70 on 75 mg HS. No SE except tremor.  Balance is not  great.  Very happy.   No depression since here.  No mood swings.  Sleep and appetite is OK.  No med changes:  04/03/2020 TC with the following noted: Patient's husband called stating that her tremor was a little worse and she was having some stutter. No evidence of stroke otherwise. First thing would be to check serum lithium level and BMP.  Order written.  Have asked her husband to let us know about the lab to which it should be sent.  04/22/20 TC witht he following noted: Lithium level is not dangerously high but it is has crept up to 1.0 which is higher than desired for her.  Our goal for her is 0.5-0.7.  At the current level it is likely causing side effects so we should reduce the dosage from lithium 300 mg plus 150 mg on M, W, F  TO 300 mg daily.  Meaning drop off the 150 mg capsule.  It will take about 2 weeks for the level to gradually come down to the desired level.  05/08/2020 appt with the following noted: Seen with H and [name] nurse. Moving to [facility] and feels OK about it.  I think I'll be fine.   Pretty good with balance.  Doing yoga and it helps. [name] has cancer. Old dog 35 & 1/2 yo still living. [name]'s concerns when went to [place] for D's BD she had difficulty time with tremor lethargy, more confusion.  Better when go back home. [name] notes using more lorazepam. At times gait is unsteady. Not significantly depressed.  Can be anxious at times.  Eating okay.  Sleeping okay.  Is easily confused under stress. Plan: reduce lithium to 300 mg nightly.  07/14/20 appt with the following noted: Seen with her husband as well as her family nurse. Feeling fine and pleased with that.   Some anxiety over the move to smaller place. [name]'s twin also having heart problems Gene. [name]'s kids came and visited.    Plan: Continue nortriptyline 75 mg, paroxetine 20 mg, lithium 300 mg, Namenda 10 mg twice daily, lorazepam 0.5 mg 1/2-1 3 times daily as needed, mirtazapine 7.5 mg  nightly  02/27/2021 appointment with the following noted:  Seen with H and nurse [place] email from nurse [name] on 02/24/2021 indicated that he had moved into [facility] in May.  Ashana has gradually become more depressed and more anxious using lorazepam as prescribed 3 times daily.  More memory issues.  She has remained engaged and initiating appropriate conversation.  She is very worried about her 65 year old dog who is in poor health and fears she  will become more depressed when the dog dies.  She and her husband do not wish to prefer to pursue further ECT unless absolutely necessary. Likes [facility] so far.   No depression by her report.  Sold big house in Feb.  It worked out well.   Worries about her dog 37 yo will die soon. H agrees she's done well with depression.   Enjoys things and has interests. Plan: Continue Lithium 300 mg daily  Continue nortriptyline 75 mg daily Continue paroxetine 20 mg daily Continue lorazepam 0.5 mg 3 times daily for both anxiety and catatonia Continue vitamin B6 and primidone for tremor Cerefolin NAC 2 daily.  05/13/2021 appointment with the following noted: Happy with [place].  H and she agree is doing well with depression.Kermit Balo socialization. She complains of memory.   Family visited and getting along with Baker Janus better.  H says Baker Janus is acting better.  Goes to dinner and activities together now.  Better relationship with Gene.. Tremor has been OK.   Ativan usually 0.25 mg TID and tolerated. Anxiety managed with this generally. Molly dog health more stable. To [place] before T'giving until April.   Plan: Continue Lithium 300 mg daily  Continue nortriptyline 75 mg daily Continue paroxetine 20 mg daily Continue lorazepam 0.5 mg 3 times daily for both anxiety and catatonia Continue vitamin B6 and primidone for tremor   08/14/2021 appointment with the following noted: Seen with her husband [name] and nurse [name]. Everyone reports that  her mood has been stable.  Her anxiety is manageable with as needed lorazepam.  She is still happy with the transition to [facility].  They are preparing to go to [place] for the winter per usual but may sell their house father there. Tolerating meds without any unusual side effects. Is dealing with grief over the loss of her dog Molly.  03/10/2022 appt noted:  seen with H and nurse Vicie Mutters [facility] house. [name] not good phsyically hard to play golf.  Been really hard. He'll be 85 in July.  Some stress scheduling with D's.   66 th wedding necessary this month.   Mood up and down.  Sister in law across the street can be difficult and demeaning.  She feels unsettled.  Some worry. Some back pain limiting activity.   Sleep is ok except with pain awakening.   No SE, except tremor some worse about 3-5 and helped by lorazepam 0.25 mg then No greater than 1 mg lorazepam daily Patient denies any recent difficulty with anxiety except as noted.  Patient denies difficulty with sleep initiation or maintenance. Denies appetite disturbance.    Patient has some difficulty with concentration.  Patient denies any suicidal ideation.  Usually takes Ativan at night and sleeps well 8 hours.   Plan no med changes  10/23/213 appt noted: Still doing well. Pneumonia and hosp for 6 days 3 weeks ago.  Viral.  Improving at this time. Increased tremor gradually in 3-4 mos helped by lorazepam avg 1.00-1.25 mg daily. No change in primidone. Total R hip August 2023 and done well.  Finishing home PT.   Appetite poor for 7-8 mos.  2 protein drinks daily but may not eat more than 1 good meal a day. [facility]. Substantial memory problems in hospital and in unusual enviornments. Nurse notices decline.  B with LBD passed in August. No pain meds or muscle relaxants beyond pain. No depression.  H note sometimes she has less motivation than others. [name] notes some normal waxing  and waning of mood. Good social interaction.   Not  spending winter in [facility] this year.  D still lives there.   No current alcohol problems  Saw Dr. Carles Collet last week and no change in neuro meds.  Past Psychiatric Medication Trials: Prozac with lithium and Zyprexa 2.5 to 5 mg, nortriptyline, paroxetine, ,  pramipexole, mirtazapine, Namenda, lorazepam,  Rexulti, Abilify, Vraylar, Latuda 10 mg, lamotrigine.   this loss list is not exhaustive  Review of Systems:  Review of Systems  Constitutional:  Negative for fatigue and unexpected weight change.  Respiratory:  Positive for shortness of breath.   Cardiovascular:  Negative for palpitations.  Gastrointestinal:  Positive for constipation.       Linzess and Mozantic managed   Musculoskeletal:  Positive for gait problem.  Neurological:  Negative for tremors and weakness.       Some balance issues  Psychiatric/Behavioral:  Positive for decreased concentration. Negative for agitation, behavioral problems, confusion, dysphoric mood, hallucinations, self-injury, sleep disturbance and suicidal ideas. The patient is nervous/anxious. The patient is not hyperactive.     Medications: I have reviewed the patient's current medications.  Current Outpatient Medications  Medication Sig Dispense Refill   aspirin 81 MG chewable tablet Chew 1 tablet (81 mg total) by mouth daily.     Calcium Carbonate-Vitamin D (CALCIUM 600/VITAMIN D PO) Take 1 tablet by mouth 2 (two) times daily.     cetirizine (ZYRTEC) 10 MG tablet Take 10 mg by mouth daily as needed for allergies.     Cholecalciferol (VITAMIN D3) 50 MCG (2000 UT) TABS Take 2,000 Units by mouth in the morning.     dexlansoprazole (DEXILANT) 60 MG capsule Take 1 capsule by mouth daily (Patient taking differently: Take 60 mg by mouth daily.) 90 capsule 3   fesoterodine (TOVIAZ) 8 MG TB24 tablet Take 1 tablet (8 mg total) by mouth daily. 90 tablet 3   Krill Oil 500 MG CAPS Take 500 mg by mouth daily.     Lavender Oil 80 MG CAPS Take 160 mg by mouth at bedtime.      levothyroxine (SYNTHROID) 75 MCG tablet Take 1 tablet (75 mcg total) by mouth daily. 90 tablet 2   linaclotide (LINZESS) 72 MCG capsule Take 1 capsule (72 mcg total) by mouth in the morning and at bedtime. 180 capsule 1   lithium carbonate 300 MG capsule Take 1 capsule (300 mg total) by mouth at bedtime. 90 capsule 0   LORazepam (ATIVAN) 0.5 MG tablet TAKE 1/2 TO 1 TABLET BY MOUTH EVERY NIGHT AT BEDTIME AND 1 TABLET EVERY 8 HOURS AS NEEDED FOR ANXIETY (Patient taking differently: Take 0.25-0.5 mg by mouth See admin instructions. TAKE 1/2 TO 1 TABLET BY MOUTH EVERY NIGHT AT BEDTIME AND 1 TABLET EVERY 8 HOURS AS NEEDED FOR ANXIETY) 180 tablet 1   magic mouthwash w/lidocaine SOLN Take 5 mLs by mouth 3 (three) times daily as needed for mouth pain. 120 mL 0   memantine (NAMENDA) 10 MG tablet Take 1 tablet (10 mg total) by mouth 2 (two) times daily. 180 tablet 1   Multiple Vitamins-Minerals (MULTIVITAMIN WITH MINERALS) tablet Take 1 tablet by mouth daily.     Multiple Vitamins-Minerals (PRESERVISION AREDS 2 PO) Take 1 capsule by mouth in the morning and at bedtime.     naloxegol oxalate (MOVANTIK) 12.5 MG TABS tablet Take 2 tablets (25 mg total) by mouth daily. Start with 1 tablet daily and if tolerating well for a week increase to 2 tablets  daily 60 tablet 3   nortriptyline (PAMELOR) 75 MG capsule Take 1 capsule (75 mg total) by mouth at bedtime. 90 capsule 1   ondansetron (ZOFRAN-ODT) 4 MG disintegrating tablet Place 1 tablet every 6 hours by translingual route as needed. 20 tablet 0   PARoxetine (PAXIL) 20 MG tablet Take 1 tablet (20 mg total) by mouth at bedtime. 90 tablet 0   polyethylene glycol (MIRALAX / GLYCOLAX) 17 g packet Take 17 g by mouth daily as needed for mild constipation.     primidone (MYSOLINE) 50 MG tablet TAKE 2 TABLETS BY MOUTH EVERY MORNING AND 1 TABLET EVERY EVENING 270 tablet 0   Pyridoxine HCl (VITAMIN B-6) 500 MG tablet Take 500 mg by mouth 2 (two) times daily.      sennosides-docusate sodium (SENOKOT-S) 8.6-50 MG tablet Take 2 tablets by mouth as needed.     traMADol (ULTRAM) 50 MG tablet Take 1-2 tablets (50-100 mg total) by mouth every 6 (six) hours as needed for moderate pain or severe pain. 42 tablet 0   valACYclovir (VALTREX) 1000 MG tablet Take 2 tablets by mouth at onset, then 2 tablets 12 hours later (Patient taking differently: Take 2,000 mg by mouth as directed.) 30 tablet 4   vitamin B-12 (CYANOCOBALAMIN) 1000 MCG tablet Take 1,000 mcg by mouth at bedtime.     mirtazapine (REMERON) 15 MG tablet Take 1 tablet (15 mg total) by mouth at bedtime. 90 tablet 1   No current facility-administered medications for this visit.    Medication Side Effects: Other: tremor  stable.  No worse.  Allergies:  Allergies  Allergen Reactions   Propranolol Other (See Comments)    Low blood pressure. Bradycardia    Brexpiprazole Other (See Comments)    Aphasia and catatonia    Past Medical History:  Diagnosis Date   Anxiety    Arthritis    "hips, spine" (03/17/2018)   Bipolar II disorder (HCC)    CHF (congestive heart failure) (HCC)    Chronic bronchitis (HCC)    Chronic lower back pain    Chronic right hip pain    CKD (chronic kidney disease), stage II    Coronary artery disease    stent x1   Esophagitis, erosive    GAD (generalized anxiety disorder)    GERD (gastroesophageal reflux disease)    Headache    "maybe monthly" (03/17/2018))   Heart murmur, systolic    History of adenomatous polyp of colon    08-04-2016  tubular adenoma   History of blood transfusion 12/2017   "related to vascular hematoma"   History of electroconvulsive therapy    at [facility]--  started 04-15-2015 to 11-17-2016  total greater than 40 times   History of hiatal hernia    Hyperlipidemia    Hypertension    Hypothyroidism    Internal carotid artery stenosis, bilateral    per last duplex 05-01-2014  bilateral ICA 40-59%   Major depression, chronic    ECT treatments  extensive and multiple started 07/ 2016   Memory loss    "both short and long-term; needs frequent reminders to follow instrucitons" (05/16/2017)   Migraines    "none in years" (03/17/2018)   OSA (obstructive sleep apnea)    per study 06/ 2012 moderate OSA  ; "refuses to wear masks" (03/17/2018)   Osteoporosis    Poor historian    due to short term memory loss   Presence of permanent cardiac pacemaker 03/17/2018   Pulmonary nodule  monitored by pcp   S/P placement of cardiac pacemaker 03/17/18 [facility]  03/18/2018   Short-term memory loss    Sick sinus syndrome (HCC)     Family History  Problem Relation Age of Onset   Heart attack Father 42       deceased   Hypertension Father    Heart disease Father    Dementia Brother    Parkinson's disease Brother    Heart disease Brother    Breast cancer Paternal Aunt        Age 34's   Breast cancer Paternal Grandmother        Age unknown   Colon cancer Neg Hx     Social History   Socioeconomic History   Marital status: Married    Spouse name: Dr. Lyla Son   Number of children: 2   Years of education: Not on file   Highest education level: Not on file  Occupational History   Occupation: housewife    Employer: UNEMPLOYED  Tobacco Use   Smoking status: Former    Packs/day: 2.00    Years: 15.00    Total pack years: 30.00    Types: Cigarettes    Quit date: 01/13/1971    Years since quitting: 51.5    Passive exposure: Never   Smokeless tobacco: Never  Vaping Use   Vaping Use: Never used  Substance and Sexual Activity   Alcohol use: Yes    Comment: occassional 1 x a week   Drug use: Never   Sexual activity: Not Currently    Comment: intercourse age 42, sexual partners less than 5  Other Topics Concern   Not on file  Social History Narrative   Not on file   Social Determinants of Health   Financial Resource Strain: Not on file  Food Insecurity: No Food Insecurity (07/06/2022)   Hunger Vital Sign    Worried About  Running Out of Food in the Last Year: Never true    Ran Out of Food in the Last Year: Never true  Transportation Needs: No Transportation Needs (07/06/2022)   PRAPARE - Hydrologist (Medical): No    Lack of Transportation (Non-Medical): No  Physical Activity: Not on file  Stress: Not on file  Social Connections: Not on file  Intimate Partner Violence: Not At Risk (07/06/2022)   Humiliation, Afraid, Rape, and Kick questionnaire    Fear of Current or Ex-Partner: No    Emotionally Abused: No    Physically Abused: No    Sexually Abused: No    Past Medical History, Surgical history, Social history, and Family history were reviewed and updated as appropriate.   Please see review of systems for further details on the patient's review from today.   Objective:   Physical Exam:  LMP  (LMP Unknown)   Physical Exam Constitutional:      General: She is not in acute distress.    Appearance: She is well-developed.  Musculoskeletal:        General: No deformity.  Neurological:     Mental Status: She is alert and oriented to person, place, and time.     Motor: No tremor.     Coordination: Coordination abnormal.     Comments: Cane use  Psychiatric:        Attention and Perception: She is attentive.        Mood and Affect: Mood is anxious. Mood is not depressed. Affect is not labile, blunt, angry or  inappropriate.        Speech: Speech is not slurred.        Behavior: Behavior normal. Behavior is not slowed or withdrawn.        Thought Content: Thought content normal. Thought content is not delusional. Thought content does not include homicidal or suicidal ideation. Thought content does not include suicidal plan.        Cognition and Memory: She exhibits impaired recent memory.        Judgment: Judgment normal.     Comments: Insight intact. No auditory or visual hallucinations.  meticulous appearance bright affect Mood and cognitive status are stable. Good fund  of knowledge and memory better than in the past. Gait better with surgery     Lab Review:     Component Value Date/Time   NA 138 07/20/2022 1159   NA 136 04/20/2017 1151   K 4.6 07/20/2022 1159   CL 106 07/20/2022 1159   CO2 25 07/20/2022 1159   GLUCOSE 86 07/20/2022 1159   BUN 26 (H) 07/20/2022 1159   BUN 24 04/20/2017 1151   CREATININE 0.82 07/20/2022 1159   CALCIUM 9.4 07/20/2022 1159   PROT 6.4 07/20/2022 1159   PROT 6.2 04/20/2017 1151   ALBUMIN 2.0 (L) 07/08/2022 0429   ALBUMIN 3.9 04/20/2017 1151   AST 58 (H) 07/20/2022 1159   ALT 73 (H) 07/20/2022 1159   ALKPHOS 69 07/08/2022 0429   BILITOT 0.4 07/20/2022 1159   BILITOT <0.2 04/20/2017 1151   GFRNONAA >60 07/08/2022 0429   GFRNONAA 50 (L) 07/10/2020 1021   GFRAA 58 (L) 07/10/2020 1021       Component Value Date/Time   WBC 7.9 07/20/2022 1159   RBC 3.11 (L) 07/20/2022 1159   HGB 10.0 (L) 07/20/2022 1159   HGB 10.2 (L) 04/20/2017 1151   HCT 28.8 (L) 07/20/2022 1159   HCT 29.6 (L) 04/20/2017 1151   PLT 352 07/20/2022 1159   PLT 385 (H) 04/20/2017 1151   MCV 92.6 07/20/2022 1159   MCV 89 04/20/2017 1151   MCH 32.2 07/20/2022 1159   MCHC 34.7 07/20/2022 1159   RDW 13.1 07/20/2022 1159   RDW 13.9 04/20/2017 1151   LYMPHSABS 1,588 07/20/2022 1159   LYMPHSABS 1.5 04/20/2017 1151   MONOABS 0.7 07/07/2022 0458   EOSABS 237 07/20/2022 1159   EOSABS 0.3 04/20/2017 1151   BASOSABS 40 07/20/2022 1159   BASOSABS 0.0 04/20/2017 1151    Lithium Lvl  Date Value Ref Range Status  07/01/2022 0.58 (L) 0.60 - 1.20 mmol/L Final    Comment:    Performed at [facility], [place] [address] [place], [place] [address]  Nortriptyline level 51 on 50 mg nightly with paroxetine 20 mg daily  07/10/2020 lithium level 0.6 at [facility] office and nortriptyline level 130 on the current dosages of lithium 300 mg nightly and nortriptyline 75 mg nightly  02/20/2021 labs lithium 0.6 on 300 mg nightly.  Nortriptyline level 49  which is lower than expected on 75 mg nightly along with paroxetine 20 mg daily.  No results found for: "PHENYTOIN", "PHENOBARB", "VALPROATE", "CBMZ"   .res Assessment: Plan:    Bipolar I disorder, mild, current or most recent episode depressed, in partial remission, with catatonia ([place]) - Plan: Lithium level, Nortriptyline level [[facility]]  Bipolar II disorder ([place]) - Plan: mirtazapine (REMERON) 15 MG tablet  Generalized anxiety disorder  Catatonia  Mild cognitive impairment  Lithium-induced tremor  Essential tremor  Lithium use  Low  vitamin D level    History of lithium toxicity repeatedly.  Greater than 50% of 50 min face to face time with patient her nurse and her husband was spent on counseling and coordination of care.  Her case has been highly complex and treatment resistant at times.  Her severe treatment resistant bipolar depression is currently in remission.  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  During the severe depression she has catatonia.  She had required longterm ECT.  A previous episode of depression approximately 10 years ago stayed in remission on Paxil and nortriptyline and lithium for a period of years until the lithium level was decreased due to tremor.  Now we are maintaining an adequate lithium level.  She has had several episodes of lithium toxicity with hospitalization.    From records it appears her last ECT was in September 2020.  Her depression has remained in remission since that time. Catatonia is resolved.  Depression and anxiety much improved.  Memory seems to be stable if not better.    She seems to be tolerating the meds well other than the lithium tremor which is being fairly  managed with primidone.  She is continuing under the care of her neurologist Dr. Carles Collet.  The nortriptyline was increased to 75 to try to reduce tendency to depression in February 2020.  Need repeat level. 02/20/2021 labs lithium  0.6 on 300 mg nightly.    Check labs every 4-6 mos.  Lithium level 0.5 on 03/05/22 on 300 mg HS is ideal with little risk toxicity bc low normal dose.  Disc the frequency of lithium labs. Need repeat level bc last was right after hospitalization with pneumonia.   Lithium level goals are between 0.5 and 0.7.  She has a history of lithium toxicity when she was taking chlorthalidone with the lithium.   Discussed signs and symptoms of lithium toxicity.  Discussed the risk of increasing depression if the lithium level gets too low.  Lithium tremor is overall little better with the reduction in lithium. She cannot tolerate higher doses of lithium.  Memory is mildly and gradually worse noted by nurse.  Overall memory appears to be stable and she has reasonable recall for recent family events.  There remains some concern about drug interactions between paroxetine and nortriptyline and given her age we do not want the nortriptyline levels higher than necessary .  However the nortriptyline level was not high. Disc risk polypharmacy  Continue Lithium 300 mg daily  Continue nortriptyline 75 mg daily Continue paroxetine 20 mg daily Continue lorazepam 0.5 mg 3 times daily for both anxiety and catatonia Continue vitamin B6 and primidone for tremor Increase mirtazapine 15 mg HS for appetite.  Also used for sleep and depression. Repeat lithium level and nortriptyline level DT tremor worse  This appointment was 50 minutes.   Follow-up 4 mos  Lynder Parents MD, DFAPA Please see After Visit Summary for patient specific instructions.  Future Appointments  Date Time Provider [place]  08/10/2022  1:30 PM Mauri Pole, MD [facility]  08/17/2022 10:30 AM [name], MD [facility]  10/05/2022  7:25 AM [facility] DEVICE REMOTES [facility]  10/12/2022  2:00 PM [facility]-CT 1 [facility]-CT [facility]  01/03/2023  3:20 PM [facility] DEVICE REMOTES [facility]  04/04/2023  3:20 PM  [facility] DEVICE REMOTES [facility]  07/04/2023  3:20 PM [facility] DEVICE REMOTES [facility]  10/03/2023  7:00 AM [facility] DEVICE REMOTES [facility]  Orders Placed This Encounter  Procedures   Lithium level   Nortriptyline level [[facility]]       -------------------------------

## 2022-07-26 NOTE — Telephone Encounter (Signed)
Scheduled patient for follow up of constipation 11:20 on 09/02/22. [name] notified.

## 2022-07-27 ENCOUNTER — Other Ambulatory Visit (HOSPITAL_BASED_OUTPATIENT_CLINIC_OR_DEPARTMENT_OTHER): Payer: Self-pay

## 2022-07-28 DIAGNOSIS — I509 Heart failure, unspecified: Secondary | ICD-10-CM | POA: Diagnosis not present

## 2022-07-28 DIAGNOSIS — G4489 Other headache syndrome: Secondary | ICD-10-CM | POA: Diagnosis not present

## 2022-07-28 DIAGNOSIS — J189 Pneumonia, unspecified organism: Secondary | ICD-10-CM | POA: Diagnosis not present

## 2022-07-28 DIAGNOSIS — I130 Hypertensive heart and chronic kidney disease with heart failure and stage 1 through stage 4 chronic kidney disease, or unspecified chronic kidney disease: Secondary | ICD-10-CM | POA: Diagnosis not present

## 2022-07-28 DIAGNOSIS — N182 Chronic kidney disease, stage 2 (mild): Secondary | ICD-10-CM | POA: Diagnosis not present

## 2022-07-28 DIAGNOSIS — D631 Anemia in chronic kidney disease: Secondary | ICD-10-CM | POA: Diagnosis not present

## 2022-07-29 DIAGNOSIS — Z96642 Presence of left artificial hip joint: Secondary | ICD-10-CM | POA: Diagnosis not present

## 2022-07-29 DIAGNOSIS — J189 Pneumonia, unspecified organism: Secondary | ICD-10-CM

## 2022-07-29 DIAGNOSIS — Z471 Aftercare following joint replacement surgery: Secondary | ICD-10-CM | POA: Diagnosis not present

## 2022-07-29 HISTORY — DX: Pneumonia, unspecified organism: J18.9

## 2022-07-30 DIAGNOSIS — N182 Chronic kidney disease, stage 2 (mild): Secondary | ICD-10-CM | POA: Diagnosis not present

## 2022-07-30 DIAGNOSIS — I509 Heart failure, unspecified: Secondary | ICD-10-CM | POA: Diagnosis not present

## 2022-07-30 DIAGNOSIS — I130 Hypertensive heart and chronic kidney disease with heart failure and stage 1 through stage 4 chronic kidney disease, or unspecified chronic kidney disease: Secondary | ICD-10-CM | POA: Diagnosis not present

## 2022-07-30 DIAGNOSIS — J189 Pneumonia, unspecified organism: Secondary | ICD-10-CM | POA: Diagnosis not present

## 2022-07-30 DIAGNOSIS — G4489 Other headache syndrome: Secondary | ICD-10-CM | POA: Diagnosis not present

## 2022-07-30 DIAGNOSIS — D631 Anemia in chronic kidney disease: Secondary | ICD-10-CM | POA: Diagnosis not present

## 2022-08-02 DIAGNOSIS — F061 Catatonic disorder due to known physiological condition: Secondary | ICD-10-CM | POA: Diagnosis not present

## 2022-08-02 DIAGNOSIS — F3175 Bipolar disorder, in partial remission, most recent episode depressed: Secondary | ICD-10-CM | POA: Diagnosis not present

## 2022-08-03 ENCOUNTER — Encounter: Payer: Self-pay | Admitting: Psychiatry

## 2022-08-04 LAB — LITHIUM LEVEL: Lithium Lvl: 0.5 mmol/L — ABNORMAL LOW (ref 0.6–1.2)

## 2022-08-04 LAB — NORTRIPTYLINE LEVEL: Nortriptyline Lvl: 84 mcg/L (ref 50–150)

## 2022-08-05 ENCOUNTER — Telehealth: Payer: Self-pay | Admitting: Neurology

## 2022-08-05 NOTE — Telephone Encounter (Signed)
[name], home RN for patient, called and left a VM. She said patient has had increased tremors and wants to see Dr. Carles Collet. Last seen 01/18/20. Okay for scheduling?

## 2022-08-05 NOTE — Progress Notes (Signed)
Stable nortriptyline and lithium level.  No change indicated.

## 2022-08-05 NOTE — Telephone Encounter (Signed)
Talked to Meghan Oliver and patient is going to [place] because after hospitalization she was required to see a headache specialist. . Patients husband doesn't feel she should attend that appointment. Patient having more severe tremors and lithium levels are normal. Wanting to come in and see Dr. Carles Collet

## 2022-08-06 NOTE — Telephone Encounter (Signed)
Danna, okay to schedule but will need to be either one of my Monday at 2:30 or wed at 11:15 appts since been so long since I have seen her.  Can use 11/16

## 2022-08-10 ENCOUNTER — Other Ambulatory Visit: Payer: Self-pay | Admitting: Psychiatry

## 2022-08-10 ENCOUNTER — Other Ambulatory Visit: Payer: Self-pay

## 2022-08-10 ENCOUNTER — Ambulatory Visit: Payer: Medicare Other | Admitting: Gastroenterology

## 2022-08-10 DIAGNOSIS — G250 Essential tremor: Secondary | ICD-10-CM

## 2022-08-10 DIAGNOSIS — F3181 Bipolar II disorder: Secondary | ICD-10-CM

## 2022-08-10 MED ORDER — LEVOTHYROXINE SODIUM 75 MCG PO TABS: 75.0000 ug | ORAL_TABLET | Freq: Every day | ORAL | 2 refills | Status: DC

## 2022-08-13 DIAGNOSIS — H35453 Secondary pigmentary degeneration, bilateral: Secondary | ICD-10-CM | POA: Diagnosis not present

## 2022-08-13 DIAGNOSIS — H35363 Drusen (degenerative) of macula, bilateral: Secondary | ICD-10-CM | POA: Diagnosis not present

## 2022-08-13 DIAGNOSIS — Z961 Presence of intraocular lens: Secondary | ICD-10-CM | POA: Diagnosis not present

## 2022-08-13 DIAGNOSIS — H353132 Nonexudative age-related macular degeneration, bilateral, intermediate dry stage: Secondary | ICD-10-CM | POA: Diagnosis not present

## 2022-08-17 ENCOUNTER — Ambulatory Visit (INDEPENDENT_AMBULATORY_CARE_PROVIDER_SITE_OTHER): Payer: Medicare Other | Admitting: Internal Medicine

## 2022-08-17 DIAGNOSIS — R413 Other amnesia: Secondary | ICD-10-CM

## 2022-08-17 DIAGNOSIS — Z950 Presence of cardiac pacemaker: Secondary | ICD-10-CM

## 2022-08-17 DIAGNOSIS — J189 Pneumonia, unspecified organism: Secondary | ICD-10-CM | POA: Diagnosis not present

## 2022-08-17 DIAGNOSIS — I2510 Atherosclerotic heart disease of native coronary artery without angina pectoris: Secondary | ICD-10-CM

## 2022-08-17 DIAGNOSIS — Z8701 Personal history of pneumonia (recurrent): Secondary | ICD-10-CM | POA: Diagnosis not present

## 2022-08-17 DIAGNOSIS — I10 Essential (primary) hypertension: Secondary | ICD-10-CM | POA: Diagnosis not present

## 2022-08-17 DIAGNOSIS — F3181 Bipolar II disorder: Secondary | ICD-10-CM | POA: Diagnosis not present

## 2022-08-17 DIAGNOSIS — E039 Hypothyroidism, unspecified: Secondary | ICD-10-CM

## 2022-08-17 DIAGNOSIS — K5909 Other constipation: Secondary | ICD-10-CM | POA: Diagnosis not present

## 2022-08-17 DIAGNOSIS — E7800 Pure hypercholesterolemia, unspecified: Secondary | ICD-10-CM | POA: Diagnosis not present

## 2022-08-19 ENCOUNTER — Ambulatory Visit: Payer: Medicare Other | Admitting: Neurology

## 2022-08-24 ENCOUNTER — Ambulatory Visit: Payer: Medicare Other | Admitting: Psychiatry

## 2022-08-30 ENCOUNTER — Ambulatory Visit: Payer: Medicare Other | Admitting: Gastroenterology

## 2022-08-31 ENCOUNTER — Other Ambulatory Visit (HOSPITAL_BASED_OUTPATIENT_CLINIC_OR_DEPARTMENT_OTHER): Payer: Self-pay

## 2022-09-01 ENCOUNTER — Encounter: Payer: Self-pay | Admitting: Internal Medicine

## 2022-09-01 NOTE — Patient Instructions (Addendum)
We are glad you are feeling better.  You are looking stronger.  Vaccines discussed.  Patient will be seeing Dr. Jaynee Eagles regarding headaches.  Recommend follow-up in 6 months or sooner if Dr. Velora Heckler or Ms. Card feels she needs anything else addressed.

## 2022-09-01 NOTE — Progress Notes (Signed)
   Subjective:    Patient ID: Meghan Oliver, female    DOB: 1941-03-27, 81 y.o.   MRN: [id number]  HPI 81 year old Female hospitalized in September with temporal headache, cough and chills.  Was diagnosed with pneumonia.  Sed rate was elevated and she had a temporal artery biopsy which was negative for temporal arteritis.  She was treated with steroids, Rocephin and azithromycin.  She is scheduled for CT again in January 2024.  Immunizations discussed.  Her nurse, Lingle accompanies her today.  Patient and her husband will be spending some time in [place].  In mid October her hemoglobin had come up from 8.6 g to 10 g.  We will repeat that at a later time.  She is eating better.  Regaining her strength.  Looks less frail than she did at last visit.    Patient underwent right total hip replacement in August by Dr. Alvan Dame.  She had good recovery from surgery.  However in late September she developed cough, chills and severe headache.  Headache was in the left temple area and had been present 3 days prior to hospitalization.  Neurology was consulted.  Sed rate was elevated.  Vascular surgery performed temporal artery biopsy which proved to be negative for giant cell arthritis.  She was treated with steroids and these were subsequently tapered.  Was found to have community-acquired pneumonia with right pleural effusion.  Thoracentesis was done by pulmonologist.  She was treated with Rocephin and azithromycin.  Pleural fluid was remarkable for borderline exudate.  With regard to headache she had MRI of the brain, CTA of head, vascular ultrasound of temporal artery all of which were negative.  Was treated with IV Solu-Medrol for severe headache and then transition to prednisone.    Review of Systems no new complaints     Objective:   Physical Exam  She is pleasant and cooperative.  Chest clear.  Cardiac exam: Regular rate and rhythm.  No lower extremity pitting edema.      Assessment & Plan:  History  of community-acquired pneumonia with temporal headache.  Temporal arteritis ruled out with temporal artery biopsy.  She is regaining her strength and had follow-up October 19 with Dr. Vaughan Browner , Pulmonologist  Hypothyroidism-continue thyroid replacement.  TSH in mid October was normal at 1.58  History of bipolar 2 disorder status post electroconvulsive therapy in the remote past  GE reflux treated with Dexilant  Chronic constipation treated with Linzess and MiraLAX  History of bipolar disorder treated by Dr. Charlott Holler with lithium and Xanax as well as Remeron  Mild memory loss treated with Namenda  History of depression and anxiety treated with Paxil and Remeron as well as Pamelor.  Also takes Mysoline.   History of hyperlipidemia--getting her appetite back.  Not on statin medication.  Can follow-up in the spring  Hypertension-currently not on antihypertensive medication  History of hiatal hernia  History of sleep apnea  Osteoporosis  Anxiety  Osteoarthritis  Plan: We will plan to see her again in 6 months or sooner if needed.  Vaccines discussed.

## 2022-09-02 ENCOUNTER — Ambulatory Visit (INDEPENDENT_AMBULATORY_CARE_PROVIDER_SITE_OTHER): Payer: Medicare Other | Admitting: Gastroenterology

## 2022-09-02 ENCOUNTER — Other Ambulatory Visit (HOSPITAL_BASED_OUTPATIENT_CLINIC_OR_DEPARTMENT_OTHER): Payer: Self-pay

## 2022-09-02 ENCOUNTER — Encounter: Payer: Self-pay | Admitting: Gastroenterology

## 2022-09-02 ENCOUNTER — Other Ambulatory Visit (INDEPENDENT_AMBULATORY_CARE_PROVIDER_SITE_OTHER): Payer: Medicare Other

## 2022-09-02 VITALS — BP 124/60 | HR 96 | Ht <= 58 in | Wt 97.1 lb

## 2022-09-02 DIAGNOSIS — K5904 Chronic idiopathic constipation: Secondary | ICD-10-CM

## 2022-09-02 DIAGNOSIS — I2510 Atherosclerotic heart disease of native coronary artery without angina pectoris: Secondary | ICD-10-CM

## 2022-09-02 DIAGNOSIS — K449 Diaphragmatic hernia without obstruction or gangrene: Secondary | ICD-10-CM | POA: Diagnosis not present

## 2022-09-02 DIAGNOSIS — K219 Gastro-esophageal reflux disease without esophagitis: Secondary | ICD-10-CM | POA: Diagnosis not present

## 2022-09-02 DIAGNOSIS — Z23 Encounter for immunization: Secondary | ICD-10-CM | POA: Diagnosis not present

## 2022-09-02 DIAGNOSIS — R7989 Other specified abnormal findings of blood chemistry: Secondary | ICD-10-CM

## 2022-09-02 LAB — HEPATIC FUNCTION PANEL
ALT: 132 U/L — ABNORMAL HIGH (ref 0–35)
AST: 77 U/L — ABNORMAL HIGH (ref 0–37)
Albumin: 3.9 g/dL (ref 3.5–5.2)
Alkaline Phosphatase: 64 U/L (ref 39–117)
Bilirubin, Direct: 0.1 mg/dL (ref 0.0–0.3)
Total Bilirubin: 0.3 mg/dL (ref 0.2–1.2)
Total Protein: 6.7 g/dL (ref 6.0–8.3)

## 2022-09-02 MED ORDER — LACTULOSE 10 GM/15ML PO SOLN
20.0000 g | Freq: Three times a day (TID) | ORAL | 3 refills | Status: AC | PRN
  Filled 2022-09-02: qty 473, 5d supply, fill #0

## 2022-09-02 MED ORDER — NALOXEGOL OXALATE 12.5 MG PO TABS
25.0000 mg | ORAL_TABLET | Freq: Every day | ORAL | 3 refills | Status: DC
  Filled 2022-09-02: qty 60, 30d supply, fill #0

## 2022-09-02 MED ORDER — LINACLOTIDE 72 MCG PO CAPS
144.0000 ug | ORAL_CAPSULE | Freq: Every day | ORAL | 1 refills | Status: DC
  Filled 2022-09-02: qty 180, 90d supply, fill #0
  Filled 2022-11-16: qty 180, 90d supply, fill #1

## 2022-09-02 MED ORDER — COMIRNATY 30 MCG/0.3ML IM SUSY
PREFILLED_SYRINGE | INTRAMUSCULAR | 0 refills | Status: DC
  Filled 2022-09-02: qty 0.3, 1d supply, fill #0

## 2022-09-02 MED ORDER — FLUAD QUADRIVALENT 0.5 ML IM PRSY
PREFILLED_SYRINGE | INTRAMUSCULAR | 0 refills | Status: DC
  Filled 2022-09-02: qty 0.5, 1d supply, fill #0

## 2022-09-02 MED ORDER — GLYCERIN (ADULT) 2 G RE SUPP
RECTAL | 0 refills | Status: AC
  Filled 2022-09-02: qty 12, fill #0
  Filled 2022-09-03: qty 25, 25d supply, fill #0

## 2022-09-02 NOTE — Progress Notes (Signed)
Meghan Oliver    [id number]    [birth year]  Primary Care Physician:Baxley, Cresenciano Lick, MD  Referring Physician: [name], MD [address] [place],   [id number]   Chief complaint: Chronic constipation  HPI:  Meghan Oliver is a very pleasant 81 yr old female here for follow up visit for worsening constipation symptoms.    She had some transient improvement with Linzess 72 mcg twice daily and Movantik at night every other day, was doing well initially but in the past few weeks she is starting to struggle again.  She can sometimes go up to a week with no bowel movement or has small soft bowel movements, does not feel like she is completely evacuating  She does not like taking additional MiraLAX as it makes it all messy and causes diarrhea  GERD symptoms are currently stable.  Denies any dysphagia, vomiting, abdominal pain or rectal bleeding.   EGD 10/05/2017 in [place]: White flecks in esophagus, biopsies to r/o candida Mild antral gastritis, medium sized hernia otherwise normal. Normal duodenum.   CT abdomen & pelvis 08/09/2017:  Mild diverticulitis of distal sigmoid colon. Large stool burden.   EGD 08/04/2016: Erosive esophagitis with esophageal ulcers, hiatal hernia, gastropathy     Outpatient Encounter Medications as of 09/02/2022  Medication Sig   aspirin 81 MG chewable tablet Chew 1 tablet (81 mg total) by mouth daily.   Calcium Carbonate-Vitamin D (CALCIUM 600/VITAMIN D PO) Take 1 tablet by mouth 2 (two) times daily.   cetirizine (ZYRTEC) 10 MG tablet Take 10 mg by mouth daily as needed for allergies.   Cholecalciferol (VITAMIN D3) 50 MCG (2000 UT) TABS Take 2,000 Units by mouth in the morning.   dexlansoprazole (DEXILANT) 60 MG capsule Take 1 capsule by mouth daily (Patient taking differently: Take 60 mg by mouth daily.)   fesoterodine (TOVIAZ) 8 MG TB24 tablet Take 1 tablet (8 mg total) by mouth daily.   Krill Oil 500 MG CAPS Take 500 mg by mouth  daily.   Lavender Oil 80 MG CAPS Take 160 mg by mouth at bedtime.   levothyroxine (SYNTHROID) 75 MCG tablet Take 1 tablet (75 mcg total) by mouth daily.   linaclotide (LINZESS) 72 MCG capsule Take 1 capsule (72 mcg total) by mouth in the morning and at bedtime.   lithium carbonate 300 MG capsule Take 1 capsule (300 mg total) by mouth at bedtime.   LORazepam (ATIVAN) 0.5 MG tablet TAKE 1/2 TO 1 TABLET BY MOUTH EVERY NIGHT AT BEDTIME AND 1 TABLET EVERY 8 HOURS AS NEEDED FOR ANXIETY (Patient taking differently: Take 0.25-0.5 mg by mouth See admin instructions. TAKE 1/2 TO 1 TABLET BY MOUTH EVERY NIGHT AT BEDTIME AND 1 TABLET EVERY 8 HOURS AS NEEDED FOR ANXIETY)   magic mouthwash w/lidocaine SOLN Take 5 mLs by mouth 3 (three) times daily as needed for mouth pain.   memantine (NAMENDA) 10 MG tablet Take 1 tablet (10 mg total) by mouth 2 (two) times daily.   mirtazapine (REMERON) 15 MG tablet Take 1 tablet (15 mg total) by mouth at bedtime.   Multiple Vitamins-Minerals (MULTIVITAMIN WITH MINERALS) tablet Take 1 tablet by mouth daily.   Multiple Vitamins-Minerals (PRESERVISION AREDS 2 PO) Take 1 capsule by mouth in the morning and at bedtime.   naloxegol oxalate (MOVANTIK) 12.5 MG TABS tablet Take 2 tablets (25 mg total) by mouth daily. Start with 1 tablet daily and if tolerating well for a  week increase to 2 tablets daily   nortriptyline (PAMELOR) 75 MG capsule Take 1 capsule (75 mg total) by mouth at bedtime.   ondansetron (ZOFRAN-ODT) 4 MG disintegrating tablet Place 1 tablet every 6 hours by translingual route as needed.   PARoxetine (PAXIL) 20 MG tablet TAKE 1 TABLET(20 MG) BY MOUTH AT BEDTIME   polyethylene glycol (MIRALAX / GLYCOLAX) 17 g packet Take 17 g by mouth daily as needed for mild constipation.   primidone (MYSOLINE) 50 MG tablet TAKE 2 TABLETS BY MOUTH EVERY MORNING AND 1 TABLET EVERY EVENING   Pyridoxine HCl (VITAMIN B-6) 500 MG tablet Take 500 mg by mouth 2 (two) times daily.    sennosides-docusate sodium (SENOKOT-S) 8.6-50 MG tablet Take 2 tablets by mouth as needed.   valACYclovir (VALTREX) 1000 MG tablet Take 2 tablets by mouth at onset, then 2 tablets 12 hours later (Patient taking differently: Take 2,000 mg by mouth as directed.)   vitamin B-12 (CYANOCOBALAMIN) 1000 MCG tablet Take 1,000 mcg by mouth at bedtime.   [DISCONTINUED] traMADol (ULTRAM) 50 MG tablet Take 1-2 tablets (50-100 mg total) by mouth every 6 (six) hours as needed for moderate pain or severe pain.   No facility-administered encounter medications on file as of 09/02/2022.    Allergies as of 09/02/2022 - Review Complete 09/02/2022  Allergen Reaction Noted   Propranolol Other (See Comments) 08/06/2013   Brexpiprazole Other (See Comments) 11/27/2015    Past Medical History:  Diagnosis Date   Anxiety    Arthritis    "hips, spine" (03/17/2018)   Bipolar II disorder (HCC)    CHF (congestive heart failure) (HCC)    Chronic bronchitis (HCC)    Chronic lower back pain    Chronic right hip pain    CKD (chronic kidney disease), stage II    Coronary artery disease    stent x1   Esophagitis, erosive    GAD (generalized anxiety disorder)    GERD (gastroesophageal reflux disease)    Headache    "maybe monthly" (03/17/2018))   Heart murmur, systolic    History of adenomatous polyp of colon    08-04-2016  tubular adenoma   History of blood transfusion 12/2017   "related to vascular hematoma"   History of electroconvulsive therapy    at [facility]--  started 04-15-2015 to 11-17-2016  total greater than 40 times   History of hiatal hernia    Hyperlipidemia    Hypertension    Hypothyroidism    Internal carotid artery stenosis, bilateral    per last duplex 05-01-2014  bilateral ICA 40-59%   Major depression, chronic    ECT treatments extensive and multiple started 07/ 2016   Memory loss    "both short and long-term; needs frequent reminders to follow instrucitons" (05/16/2017)   Migraines    "none  in years" (03/17/2018)   OSA (obstructive sleep apnea)    per study 06/ 2012 moderate OSA  ; "refuses to wear masks" (03/17/2018)   Osteoporosis    Pneumonia 07/29/2022   Poor historian    due to short term memory loss   Presence of permanent cardiac pacemaker 03/17/2018   Pulmonary nodule    monitored by pcp   S/P placement of cardiac pacemaker 03/17/18 [facility]  03/18/2018   Short-term memory loss    Sick sinus syndrome [facility])     Past Surgical History:  Procedure Laterality Date   APPENDECTOMY  1971   ARTERY BIOPSY Left 07/05/2022   Procedure: BIOPSY TEMPORAL ARTERY;  Surgeon: Broadus John, MD;  Location: [facility] OR;  Service: Vascular;  Laterality: Left;   AUGMENTATION MAMMAPLASTY Bilateral    CARDIOVASCULAR STRESS TEST  01-30-2015  dr Aundra Dubin   Low risk nuclear study w/ no evidence ischemia or infarction/  normal LV funciton and wall motion , 76%   COLONOSCOPY W/ BIOPSIES AND POLYPECTOMY  "multiple"   COLONOSCOPY WITH ESOPHAGOGASTRODUODENOSCOPY (EGD)  last one 08-04-2016   CORONARY ANGIOPLASTY WITH STENT PLACEMENT  05/16/2017   "LAD"   CORONARY STENT INTERVENTION N/A 05/16/2017   Procedure: CORONARY STENT INTERVENTION;  Surgeon: Burnell Blanks, MD;  Location: [facility];  Service: Cardiovascular;  Laterality: N/A;   ESOPHAGOGASTRODUODENOSCOPY  02-26-04   ESOPHAGOGASTRODUODENOSCOPY  "multiple"   INSERT / REPLACE / REMOVE PACEMAKER  03/17/2018   IR THORACENTESIS ASP PLEURAL SPACE W/IMG GUIDE  07/07/2022   LEFT HEART CATH AND CORONARY ANGIOGRAPHY N/A 05/16/2017   Procedure: LEFT HEART CATH AND CORONARY ANGIOGRAPHY;  Surgeon: Larey Dresser, MD;  Location: [facility];  Service: Cardiovascular;  Laterality: N/A;   OVARIAN CYST SURGERY  1970s   Laparotomy    PACEMAKER IMPLANT N/A 03/17/2018   Procedure: PACEMAKER IMPLANT;  Surgeon: Evans Lance, MD;  Location: [facility];  Service: Cardiovascular;  Laterality: N/A;   PORT-A-CATH PLACEMENT  05/31/2016;  2018   "@ [facility]; for ECT series"; "@ [facility] also"   PORT-A-CATH REMOVAL N/A 03/11/2017   Procedure: REMOVAL PORT-A-CATH;  Surgeon: Jackolyn Confer, MD;  Location: [facility];  Service: General;  Laterality: N/A;   PORTA CATH REMOVAL  03/17/2018   PORTA CATH REMOVAL  03/17/2018   Procedure: PORTA CATH REMOVAL;  Surgeon: Evans Lance, MD;  Location: [facility];  Service: Cardiovascular;;   TOTAL HIP ARTHROPLASTY Right 05/18/2022   Procedure: TOTAL HIP ARTHROPLASTY ANTERIOR APPROACH;  Surgeon: Paralee Cancel, MD;  Location: [facility];  Service: Orthopedics;  Laterality: Right;   TRANSTHORACIC ECHOCARDIOGRAM  09/05/2013  dr Aundra Dubin   mild LVH, ef 97-98%, grade 1 diastolic dysfunction/  very mild AV stenosis with mild AR/  trivial MR and PT/ mild to moderate LAE/ mild TR/ mild pulmonary hypertension with PA peak pressure 6mHg   TUBAL LIGATION      Family History  Problem Relation Age of Onset   Heart attack Father 569      deceased   Hypertension Father    Heart disease Father    Dementia Brother    Parkinson's disease Brother    Heart disease Brother    Breast cancer Paternal Aunt        Age 82's  Breast cancer Paternal Grandmother        Age unknown   Colon cancer Neg Hx     Social History   Socioeconomic History   Marital status: Married    Spouse name: Dr. SLyla Son  Number of children: 2   Years of education: Not on file   Highest education level: Not on file  Occupational History   Occupation: housewife    Employer: UNEMPLOYED  Tobacco Use   Smoking status: Former    Packs/day: 2.00    Years: 15.00    Total pack years: 30.00    Types: Cigarettes    Quit date: 01/13/1971    Years since quitting: 51.6    Passive exposure: Never   Smokeless tobacco: Never  Vaping Use   Vaping Use: Never used  Substance and Sexual Activity  Alcohol use: Yes    Comment: occassional 1 x a week   Drug use: Never   Sexual activity: Not Currently    Comment:  intercourse age 43, sexual partners less than 5  Other Topics Concern   Not on file  Social History Narrative   Not on file   Social Determinants of Health   Financial Resource Strain: Not on file  Food Insecurity: No Food Insecurity (07/06/2022)   Hunger Vital Sign    Worried About Running Out of Food in the Last Year: Never true    Ran Out of Food in the Last Year: Never true  Transportation Needs: No Transportation Needs (07/06/2022)   PRAPARE - Hydrologist (Medical): No    Lack of Transportation (Non-Medical): No  Physical Activity: Not on file  Stress: Not on file  Social Connections: Not on file  Intimate Partner Violence: Not At Risk (07/06/2022)   Humiliation, Afraid, Rape, and Kick questionnaire    Fear of Current or Ex-Partner: No    Emotionally Abused: No    Physically Abused: No    Sexually Abused: No      Review of systems: All other review of systems negative except as mentioned in the HPI.   Physical Exam: Vitals:   09/02/22 1118  BP: 124/60  Pulse: 96   Body mass index is 20.3 kg/m. Gen:      No acute distress HEENT:  sclera anicteric Abd:      soft, non-tender; no palpable masses, no distension Ext:    No edema Neuro: alert and oriented x 3 Psych: normal mood and affect  Data Reviewed:  Reviewed labs, radiology imaging, old records and pertinent past GI work up   Assessment and Plan/Recommendations:  81 yr old very pleasant female with h/o hiatal hernia, erosive esophagitis, candida with worsening constipation and GERD symptoms   Constipation: Use Linzess 2 capsules of 72 mcg daily in the morning before breakfast and 2 capsules of 12.5 mg Movantik at bedtime, titrate dose based on response Discussed increasing dietary intake of fruits and vegetables, 8 cups of water Currently her diet is extremely restricted, she is trying to replace her meals with protein drinks and V8 juice Use glycerin suppository daily as  needed if no bowel movement for > 3-4 days Use lactulose 20 g up to 3 times daily as needed if no bowel movement for > 6-7 days  Elevated transaminases: Possible drug-induced liver injury or transaminitis in setting of acute systemic infection Will recheck LFT  GERD symptoms: Stable on current regimen Continue Dexilant Antireflux measures   Return in 3 months or sooner if needed  The patient, Dr. Inocente Salles and [name] (Care giver) was provided an opportunity to ask questions and all were answered. They agreed with the plan and demonstrated an understanding of the instructions.  Damaris Hippo , MD    CC: [name], MD

## 2022-09-02 NOTE — Patient Instructions (Addendum)
_______________________________________________________  If you are age 82 or older, your body mass index should be between 23-30. Your Body mass index is 20.3 kg/m. If this is out of the aforementioned range listed, please consider follow up with your Primary Care Provider.  If you are age 60 or younger, your body mass index should be between 19-25. Your Body mass index is 20.3 kg/m. If this is out of the aformentioned range listed, please consider follow up with your Primary Care Provider.   ________________________________________________________  The [facility] providers would like to encourage you to use [facility] to communicate with providers for non-urgent requests or questions.  Due to long hold times on the telephone, sending your provider a message by [facility] may be a faster and more efficient way to get a response.  Please allow 48 business hours for a response.  Please remember that this is for non-urgent requests.  _______________________________________________________  Linzess 57mg 2 capsule in the morning before breakfast. Movantik 12.'5mg'$  2 tablets in the evening.  We have sent the following medications to your pharmacy for you to pick up at your convenience: Lactulose  Take 30 mLs (20 g total) by mouth 3 (three) times daily between meals as needed for mild constipation Glycerine suppository use as needed if no bowel movement in 4 to 5 days.  Your provider has requested that you go to the basement level for lab work before leaving today. Press "B" on the elevator. The lab is located at the first door on the left as you exit the elevator.  Due to recent changes in healthcare laws, you may see the results of your imaging and laboratory studies on MyChart before your provider has had a chance to review them.  We understand that in some cases there may be results that are confusing or concerning to you. Not all laboratory results come back in the same time frame and the provider may  be waiting for multiple results in order to interpret others.  Please give [email or web address] hours in order for your provider to thoroughly review all the results before contacting the office for clarification of your results.

## 2022-09-03 ENCOUNTER — Other Ambulatory Visit (HOSPITAL_BASED_OUTPATIENT_CLINIC_OR_DEPARTMENT_OTHER): Payer: Self-pay

## 2022-09-03 NOTE — Progress Notes (Unsigned)
Assessment/Plan:    1.  Tremor, lithium-induced  -Has been off of lithium in the past and tremor did abate.  She understands lithium induced tremor is very difficult to control with other oral medications.  Tremor overall mild today  -Patient thinks primidone has been helpful.  We will continue primidone, 50 mg in the morning and 100 mg at night.  Looks like primidone being refilled by [name].  -Would really like to avoid the use of Ativan for tremor, especially in the daytime given risk for falls.  She goes through about 45 Ativan per month, using 7 at bedtime and some in the daytime for tremor.  2.  Memory change  -Last neurocognitive testing in January, 2019 with evidence of dementia  -ECT has affected her memory.  -History of alcohol use.  ***Had d/c EtOH last time but husband states drinking some socially now.  He asks me about that.  I would like to see total abstinence and we discussed reasoning and husband agrees but pt somewhat frustrated  - 3.  Syncope  -Resolved once patient had PPM placed 4.  Major depressive disorder  -Doing well and has held the ECT for now.  -Following with psychiatry. 5.  Headache  -Suspect possibly toxic headache.  She developed a headache post hip surgery for a few days, and then the most severe headache came in association with pneumonia.  Temporal artery biopsy was negative.  MRI brain was unremarkable.  Subjective:   Meghan Oliver was seen today in follow up for tremor.  My previous records were reviewed prior to todays visit.  Pt accompanied by ***  who supplements the hx. I have not seen the patient for about 2-1/2 years.  At that point in time, tremor was fairly well-controlled and primidone was being refilled by [name], so we decided to just see her on an as-needed basis.  She was in the hospital in October for cough, chills and headache and turns out that she had pneumonia.  She had a temporal artery biopsy because of the headache and  that was negative.  She was given a quick prednisone taper and headache resolved.  She also had a similar headache at the end of September after her hip replacement, but it only lasted for a few days.  Headache was left-sided.  She had an MRI brain September 29 with and without gadolinium.  This demonstrated mild small vessel disease but was unremarkable otherwise.  I personally reviewed that.  Patient has seen [name] recently, last on October 23.  Notes are reviewed.  He noted patient complained about increasing tremor for which she was using lorazepam.  Current prescribed movement disorder medications: Primidone, 50 mg, 1 tablet in the morning and 2 tablets at night   ALLERGIES:   Allergies  Allergen Reactions   Propranolol Other (See Comments)    Low blood pressure. Bradycardia    Brexpiprazole Other (See Comments)    Aphasia and catatonia    CURRENT MEDICATIONS:  Outpatient Encounter Medications as of 09/06/2022  Medication Sig   aspirin 81 MG chewable tablet Chew 1 tablet (81 mg total) by mouth daily.   Calcium Carbonate-Vitamin D (CALCIUM 600/VITAMIN D PO) Take 1 tablet by mouth 2 (two) times daily.   cetirizine (ZYRTEC) 10 MG tablet Take 10 mg by mouth daily as needed for allergies.   Cholecalciferol (VITAMIN D3) 50 MCG (2000 UT) TABS Take 2,000 Units by mouth in the morning.   COVID-19 mRNA  vaccine 2023-2024 (COMIRNATY) syringe Inject into the muscle.   dexlansoprazole (DEXILANT) 60 MG capsule Take 1 capsule by mouth daily (Patient taking differently: Take 60 mg by mouth daily.)   fesoterodine (TOVIAZ) 8 MG TB24 tablet Take 1 tablet (8 mg total) by mouth daily.   glycerin adult 2 g suppository Use 1 suppository as needed if you have not had a bowel movement in  4 to 5 days   influenza vaccine adjuvanted (FLUAD QUADRIVALENT) 0.5 ML injection Inject into the muscle.   Krill Oil 500 MG CAPS Take 500 mg by mouth daily.   lactulose (CHRONULAC) 10 GM/15ML solution Take 30 mLs (20 g  total) by mouth 3 (three) times daily between meals as needed for mild constipation.   Lavender Oil 80 MG CAPS Take 160 mg by mouth at bedtime.   levothyroxine (SYNTHROID) 75 MCG tablet Take 1 tablet (75 mcg total) by mouth daily.   linaclotide (LINZESS) 72 MCG capsule Take 2 capsules (144 mcg total) by mouth daily before breakfast.   lithium carbonate 300 MG capsule Take 1 capsule (300 mg total) by mouth at bedtime.   LORazepam (ATIVAN) 0.5 MG tablet TAKE 1/2 TO 1 TABLET BY MOUTH EVERY NIGHT AT BEDTIME AND 1 TABLET EVERY 8 HOURS AS NEEDED FOR ANXIETY (Patient taking differently: Take 0.25-0.5 mg by mouth See admin instructions. TAKE 1/2 TO 1 TABLET BY MOUTH EVERY NIGHT AT BEDTIME AND 1 TABLET EVERY 8 HOURS AS NEEDED FOR ANXIETY)   magic mouthwash w/lidocaine SOLN Take 5 mLs by mouth 3 (three) times daily as needed for mouth pain.   memantine (NAMENDA) 10 MG tablet Take 1 tablet (10 mg total) by mouth 2 (two) times daily.   mirtazapine (REMERON) 15 MG tablet Take 1 tablet (15 mg total) by mouth at bedtime.   Multiple Vitamins-Minerals (MULTIVITAMIN WITH MINERALS) tablet Take 1 tablet by mouth daily.   Multiple Vitamins-Minerals (PRESERVISION AREDS 2 PO) Take 1 capsule by mouth in the morning and at bedtime.   naloxegol oxalate (MOVANTIK) 12.5 MG TABS tablet Take 2 tablets (25 mg total) by mouth daily.   nortriptyline (PAMELOR) 75 MG capsule Take 1 capsule (75 mg total) by mouth at bedtime.   ondansetron (ZOFRAN-ODT) 4 MG disintegrating tablet Place 1 tablet every 6 hours by translingual route as needed.   PARoxetine (PAXIL) 20 MG tablet TAKE 1 TABLET(20 MG) BY MOUTH AT BEDTIME   polyethylene glycol (MIRALAX / GLYCOLAX) 17 g packet Take 17 g by mouth daily as needed for mild constipation.   primidone (MYSOLINE) 50 MG tablet TAKE 2 TABLETS BY MOUTH EVERY MORNING AND 1 TABLET EVERY EVENING   Pyridoxine HCl (VITAMIN B-6) 500 MG tablet Take 500 mg by mouth 2 (two) times daily.   sennosides-docusate  sodium (SENOKOT-S) 8.6-50 MG tablet Take 2 tablets by mouth as needed.   valACYclovir (VALTREX) 1000 MG tablet Take 2 tablets by mouth at onset, then 2 tablets 12 hours later (Patient taking differently: Take 2,000 mg by mouth as directed.)   vitamin B-12 (CYANOCOBALAMIN) 1000 MCG tablet Take 1,000 mcg by mouth at bedtime.   No facility-administered encounter medications on file as of 09/06/2022.     Objective:    PHYSICAL EXAMINATION:    VITALS:   There were no vitals filed for this visit.  Wt Readings from Last 3 Encounters:  09/02/22 97 lb 2 oz (44.1 kg)  07/22/22 95 lb 6.4 oz (43.3 kg)  07/19/22 98 lb (44.5 kg)     GEN:  The  patient appears stated age and is in NAD. HEENT:  Normocephalic, atraumatic.  The mucous membranes are moist. The superficial temporal arteries are without ropiness or tenderness. CV:  RRR Lungs:  CTAB Neck/HEME:  There are no carotid bruits bilaterally.  Neurological examination:  Orientation: The patient is alert and oriented to person and place Cranial nerves: There is good facial symmetry. The speech is fluent and clear. Soft palate rises symmetrically and there is no tongue deviation. Hearing is intact to conversational tone. Sensation: Sensation is intact to light touch throughout Motor: Strength is at least antigravity x4.  Movement examination: Tone: There is normal tone in the UE/LE Abnormal movements: very mild tremor of outstretched hands.  No worse when given a weight. Coordination:  There is no decremation with RAM's, with any form of RAMS, including alternating supination and pronation of the forearm, hand opening and closing, finger taps, heel taps and toe taps. Gait and Station: The patient has no difficulty arising out of a deep-seated chair without the use of the hands. The patient's stride length is wide based but she does well in the hall.    Total time spent on today's visit was *** minutes, including both face-to-face time and  nonface-to-face time.  Time included that spent on review of records (prior notes available to me/labs/imaging if pertinent), discussing treatment and goals, answering patient's questions and coordinating care.  Cc:  [name], MD

## 2022-09-06 ENCOUNTER — Encounter: Payer: Self-pay | Admitting: Neurology

## 2022-09-06 ENCOUNTER — Ambulatory Visit (INDEPENDENT_AMBULATORY_CARE_PROVIDER_SITE_OTHER): Payer: Medicare Other | Admitting: Neurology

## 2022-09-06 ENCOUNTER — Ambulatory Visit: Payer: Medicare Other | Admitting: Neurology

## 2022-09-06 ENCOUNTER — Other Ambulatory Visit (HOSPITAL_BASED_OUTPATIENT_CLINIC_OR_DEPARTMENT_OTHER): Payer: Self-pay

## 2022-09-06 VITALS — BP 142/62 | HR 98 | Ht <= 58 in | Wt 96.0 lb

## 2022-09-06 DIAGNOSIS — R413 Other amnesia: Secondary | ICD-10-CM

## 2022-09-06 DIAGNOSIS — I2510 Atherosclerotic heart disease of native coronary artery without angina pectoris: Secondary | ICD-10-CM | POA: Diagnosis not present

## 2022-09-06 DIAGNOSIS — G251 Drug-induced tremor: Secondary | ICD-10-CM | POA: Diagnosis not present

## 2022-09-06 DIAGNOSIS — Z1231 Encounter for screening mammogram for malignant neoplasm of breast: Secondary | ICD-10-CM | POA: Diagnosis not present

## 2022-09-06 LAB — HM MAMMOGRAPHY

## 2022-09-06 MED ORDER — AMOXICILLIN 500 MG PO CAPS
1500.0000 mg | ORAL_CAPSULE | ORAL | 1 refills | Status: DC
  Filled 2022-09-06: qty 9, 3d supply, fill #0
  Filled 2022-09-07 – 2022-09-08 (×2): qty 9, 3d supply, fill #1

## 2022-09-06 NOTE — Patient Instructions (Addendum)
You look great today!!!  Good to see you!  No changes in your medication.  The physicians and staff at [facility] are committed to providing excellent care. You may receive a survey requesting feedback about your experience at our office. We strive to receive "very good" responses to the survey questions. If you feel that your experience would prevent you from giving the office a "very good " response, please contact our office to try to remedy the situation. We may be reached at [phone]. Thank you for taking the time out of your busy day to complete the survey.

## 2022-09-07 ENCOUNTER — Other Ambulatory Visit (HOSPITAL_BASED_OUTPATIENT_CLINIC_OR_DEPARTMENT_OTHER): Payer: Self-pay

## 2022-09-08 ENCOUNTER — Encounter: Payer: Self-pay | Admitting: Internal Medicine

## 2022-09-08 ENCOUNTER — Other Ambulatory Visit (HOSPITAL_BASED_OUTPATIENT_CLINIC_OR_DEPARTMENT_OTHER): Payer: Self-pay

## 2022-09-09 ENCOUNTER — Other Ambulatory Visit: Payer: Self-pay

## 2022-09-09 DIAGNOSIS — R7989 Other specified abnormal findings of blood chemistry: Secondary | ICD-10-CM

## 2022-09-10 ENCOUNTER — Ambulatory Visit (HOSPITAL_BASED_OUTPATIENT_CLINIC_OR_DEPARTMENT_OTHER): Payer: Medicare Other | Attending: Student | Admitting: Physical Therapy

## 2022-09-10 ENCOUNTER — Encounter (HOSPITAL_BASED_OUTPATIENT_CLINIC_OR_DEPARTMENT_OTHER): Payer: Self-pay | Admitting: Physical Therapy

## 2022-09-10 DIAGNOSIS — M6281 Muscle weakness (generalized): Secondary | ICD-10-CM | POA: Diagnosis not present

## 2022-09-10 DIAGNOSIS — R262 Difficulty in walking, not elsewhere classified: Secondary | ICD-10-CM | POA: Diagnosis not present

## 2022-09-10 DIAGNOSIS — Z96641 Presence of right artificial hip joint: Secondary | ICD-10-CM | POA: Diagnosis not present

## 2022-09-10 DIAGNOSIS — R2681 Unsteadiness on feet: Secondary | ICD-10-CM | POA: Insufficient documentation

## 2022-09-10 NOTE — Therapy (Addendum)
OUTPATIENT PHYSICAL THERAPY LOWER EXTREMITY EVALUATION   Patient Name: Meghan Oliver MRN: [id number] DOB:[birth year], 81 y.o., female Today's Date: 09/10/2022  END OF SESSION:   Past Medical History:  Diagnosis Date   Anxiety    Arthritis    "hips, spine" (03/17/2018)   Bipolar II disorder (HCC)    CHF (congestive heart failure) (HCC)    Chronic bronchitis (HCC)    Chronic lower back pain    Chronic right hip pain    CKD (chronic kidney disease), stage II    Coronary artery disease    stent x1   Esophagitis, erosive    GAD (generalized anxiety disorder)    GERD (gastroesophageal reflux disease)    Headache    "maybe monthly" (03/17/2018))   Heart murmur, systolic    History of adenomatous polyp of colon    08-04-2016  tubular adenoma   History of blood transfusion 12/2017   "related to vascular hematoma"   History of electroconvulsive therapy    at [facility]--  started 04-15-2015 to 11-17-2016  total greater than 40 times   History of hiatal hernia    Hyperlipidemia    Hypertension    Hypothyroidism    Internal carotid artery stenosis, bilateral    per last duplex 05-01-2014  bilateral ICA 40-59%   Major depression, chronic    ECT treatments extensive and multiple started 07/ 2016   Memory loss    "both short and long-term; needs frequent reminders to follow instrucitons" (05/16/2017)   Migraines    "none in years" (03/17/2018)   OSA (obstructive sleep apnea)    per study 06/ 2012 moderate OSA  ; "refuses to wear masks" (03/17/2018)   Osteoporosis    Pneumonia 07/29/2022   Poor historian    due to short term memory loss   Presence of permanent cardiac pacemaker 03/17/2018   Pulmonary nodule    monitored by pcp   S/P placement of cardiac pacemaker 03/17/18 [facility]  03/18/2018   Short-term memory loss    Sick sinus syndrome [facility])    Past Surgical History:  Procedure Laterality Date   APPENDECTOMY  1971   ARTERY BIOPSY Left 07/05/2022   Procedure: BIOPSY TEMPORAL  ARTERY;  Surgeon: Broadus John, MD;  Location: [facility] OR;  Service: Vascular;  Laterality: Left;   AUGMENTATION MAMMAPLASTY Bilateral    CARDIOVASCULAR STRESS TEST  01-30-2015  dr Aundra Dubin   Low risk nuclear study w/ no evidence ischemia or infarction/  normal LV funciton and wall motion , 76%   COLONOSCOPY W/ BIOPSIES AND POLYPECTOMY  "multiple"   COLONOSCOPY WITH ESOPHAGOGASTRODUODENOSCOPY (EGD)  last one 08-04-2016   CORONARY ANGIOPLASTY WITH STENT PLACEMENT  05/16/2017   "LAD"   CORONARY STENT INTERVENTION N/A 05/16/2017   Procedure: CORONARY STENT INTERVENTION;  Surgeon: Burnell Blanks, MD;  Location: [place] [facility];  Service: Cardiovascular;  Laterality: N/A;   ESOPHAGOGASTRODUODENOSCOPY  02-26-04   ESOPHAGOGASTRODUODENOSCOPY  "multiple"   INSERT / REPLACE / REMOVE PACEMAKER  03/17/2018   IR THORACENTESIS ASP PLEURAL SPACE W/IMG GUIDE  07/07/2022   LEFT HEART CATH AND CORONARY ANGIOGRAPHY N/A 05/16/2017   Procedure: LEFT HEART CATH AND CORONARY ANGIOGRAPHY;  Surgeon: Larey Dresser, MD;  Location: [place] [facility];  Service: Cardiovascular;  Laterality: N/A;   OVARIAN CYST SURGERY  1970s   Laparotomy    PACEMAKER IMPLANT N/A 03/17/2018   Procedure: PACEMAKER IMPLANT;  Surgeon: Evans Lance, MD;  Location: [place] [facility];  Service: Cardiovascular;  Laterality: N/A;   PORT-A-CATH PLACEMENT  05/31/2016; 2018   "@ [facility]; for ECT series"; "@ [facility] also"   PORT-A-CATH REMOVAL N/A 03/11/2017   Procedure: REMOVAL PORT-A-CATH;  Surgeon: Jackolyn Confer, MD;  Location: [facility];  Service: General;  Laterality: N/A;   PORTA CATH REMOVAL  03/17/2018   PORTA CATH REMOVAL  03/17/2018   Procedure: PORTA CATH REMOVAL;  Surgeon: Evans Lance, MD;  Location: [place] [facility];  Service: Cardiovascular;;   TOTAL HIP ARTHROPLASTY Right 05/18/2022   Procedure: TOTAL HIP ARTHROPLASTY ANTERIOR APPROACH;  Surgeon: Paralee Cancel, MD;  Location: [facility];  Service:  Orthopedics;  Laterality: Right;   TRANSTHORACIC ECHOCARDIOGRAM  09/05/2013  dr Aundra Dubin   mild LVH, ef [id number], grade 1 diastolic dysfunction/  very mild AV stenosis with mild AR/  trivial MR and PT/ mild to moderate LAE/ mild TR/ mild pulmonary hypertension with PA peak pressure 52mHg   TUBAL LIGATION     Patient Active Problem List   Diagnosis Date Noted   Elevated erythrocyte sedimentation rate    Elevated LFTs    Community acquired pneumonia 07/01/2022   Memory loss 07/01/2022   Temporal headache 07/01/2022   Macrocytic anemia 07/01/2022   Elevated troponin 07/01/2022   Chronic constipation 07/01/2022   S/P total right hip arthroplasty 05/18/2022   Overactive bladder 02/19/2022   S/P placement of cardiac pacemaker 03/17/18 [facility]  03/18/2018   Sinus node dysfunction ([place] 03/17/2018   Bradycardia 07/16/2017   Unstable angina (HCC)    Hypotension 04/20/2017   Coronary artery calcification seen on CAT scan 06/25/2012   Lung nodule 06/25/2012   Hypothyroidism 03/21/2012   bipolar/depression    Chronic cough 08/12/2011   OSA (obstructive sleep apnea) 04/05/2011   EDEMA 11/06/2010   Chest pain 11/06/2010   Hyperlipidemia 08/22/2007   Bipolar disorder ([place] 08/22/2007   GERD 08/22/2007   LOW BACK PAIN 08/22/2007   OSTEOPOROSIS 08/22/2007   INSOMNIA [id number]  SYSTOLIC MURMUR [id number]   PCP: BTedra SenegalMD   REFERRING PROVIDER: DOdetta Pink REFERRING DIAG:  [id number](ICD-10-CM) - Presence of left artificial hip joint  Z47.1 (ICD-10-CM) - Aftercare following joint replacement surgery    THERAPY DIAG:  No diagnosis found.  Rationale for Evaluation and Treatment: Rehabilitation  ONSET DATE: 08/03/2022  SUBJECTIVE:   SUBJECTIVE STATEMENT:  Had anterior hip replacement this year. The problem I'm having is getting into high cars like SUVs. Didn't have HHPT. Something happened with my left knee no mode of injury.   PERTINENT HISTORY: 07/29/2022  07/29/2022 Assessment:  1. 10 weeks status post right total hip arthroplasty, doing well  2. Recent admission due to pneumonia, now resolved    Plan: Ms. LWinberryis now almost 3 month out from right THA. She has recovered well in terms of the hip. Unfortunately, she dealt with a recent diagnosis of pneumonia which required admission to the hospital. She is back home now, and they are requesting OPPT to continue improving her strength and balance. I do feel this is appropriate. [facility] Follow up with [place]for 1 year appointment, or sooner with any questions or concerns. All questions welcomed and addressed. PAIN:  Are you having pain?  "I don't know how to rate this is annoying pain do you xray this does it get better?   PRECAUTIONS: Other: none anterior hip R  WEIGHT BEARING RESTRICTIONS: No  FALLS:  Has patient fallen in  last 6 months? No  LIVING ENVIRONMENT: Lives with: lives with their family with home aide but very unclear on how many hours per day  Lives in: House/apartment Stairs: no STE, one story home  Has following equipment at home:  tripod cane  OCCUPATION: retired   PLOF: Independent, Independent with basic ADLs, Independent with gait, Independent with transfers, and Requires assistive device for independence  PATIENT GOALS: "I'm satisfied with the hip but I don't know what I'll do about the left knee"   NEXT MD VISIT:   OBJECTIVE:   DIAGNOSTIC FINDINGS:   PATIENT SURVEYS:  FOTO will second session  COGNITION: Overall cognitive status:  unclear, no family present to clarify baseline cognitive status       SENSATION: Not tested  EDEMA:    MUSCLE LENGTH:   POSTURE:   PALPATION:   LOWER EXTREMITY ROM:  Active ROM Right eval Left eval  Hip flexion    Hip extension    Hip abduction    Hip adduction    Hip internal rotation    Hip external rotation    Knee flexion    Knee extension    Ankle dorsiflexion    Ankle  plantarflexion    Ankle inversion    Ankle eversion     (Blank rows = not tested)  LOWER EXTREMITY MMT:  MMT Right eval Left eval  Hip flexion 4+ 3  Hip extension    Hip abduction 4 3  Hip adduction    Hip internal rotation    Hip external rotation    Knee flexion 5 5  Knee extension 5 5  Ankle dorsiflexion 5 5  Ankle plantarflexion    Ankle inversion    Ankle eversion     (Blank rows = not tested)  LOWER EXTREMITY SPECIAL TESTS:    FUNCTIONAL TESTS:  5 times sit to stand: 13.4 seconds BUEs  3 minute walk test: 411f no device  DGI 12/24  GAIT: Distance walked: 4973fAssistive device utilized: None Level of assistance: SBA Comments: + trendelenburg, limited trunk and hip rotation, mild unsteadiness with fatigue    TODAY'S TREATMENT:                                                                                                                              DATE:   Eval   Objective measures + appropriate education  TherEx  Bridges x10 Sidelying clams red TB x10 B Seated marches x10 B red TB      PATIENT EDUCATION:  Education details: HEP, POC, exam findings  Person educated: Patient Education method: Explanation Education comprehension: verbalized understanding, returned demonstration, and needs further education  HOME EXERCISE PROGRAM:  Access Code: [email or web address]: [email or web address] Date: 09/10/2022 Prepared by: KrDeniece ReeExercises - Supine Bridge  - 2 x daily - 7 x weekly - 1 sets - 10 reps - 2 hold - Clam with Resistance  - 2 x daily - 7 x  weekly - 1 sets - 10 reps - 2 hold - Seated March with Resistance  - 2 x daily - 7 x weekly - 1 sets - 10 reps - 2 hold  ASSESSMENT:  CLINICAL IMPRESSION: Patient is a 81 y.o. F who was seen today for physical therapy evaluation and treatment for skilled PT care s/p hip replacement. Exam reveals impairments in functional strength, gait, functional activity tolerance, and balance. Will  benefit from skilled PT services to address all impairments and reduce fall risk.   OBJECTIVE IMPAIRMENTS: Abnormal gait, decreased balance, decreased cognition, decreased knowledge of use of DME, decreased mobility, difficulty walking, decreased strength, decreased safety awareness, and postural dysfunction.   ACTIVITY LIMITATIONS: standing, squatting, stairs, and locomotion level  PARTICIPATION LIMITATIONS: driving, shopping, and community activity  PERSONAL FACTORS: Age, Behavior pattern, Fitness, Past/current experiences, Social background, and Time since onset of injury/illness/exacerbation are also affecting patient's functional outcome.   REHAB POTENTIAL: Good  CLINICAL DECISION MAKING: Stable/uncomplicated  EVALUATION COMPLEXITY: Low   GOALS: Goals reviewed with patient? Yes  SHORT TERM GOALS: Target date: 10/08/2022   Will be compliant with appropriate HEP with no more than MinA  Baseline: Goal status: INITIAL  2.  Will have no more than 1/10 pain in L knee  Baseline:  Goal status: INITIAL  3.  Will be able to maintain tandem stance for at least 30 seconds without LOB solid surface  Baseline:  Goal status: INITIAL  4.  Will be able to name 3 ways to reduce fall risk at home and in community  Baseline:  Goal status: INITIAL   LONG TERM GOALS: Target date: 11/05/2022    MMT to improve by 1 grade in all weak groups  Baseline:  Goal status: INITIAL  2.  Will score at least 18 on DGI to show improved balance  Baseline:  Goal status: INITIAL  3.  Will be able to ambulate at least 664f in 3MWT to show improved community mobility  Baseline:  Goal status: INITIAL  4.  Will be able to confidently walk on inclines outside with LRAD so she can participate in regular walking program with caregiver  Baseline:  Goal status: INITIAL     PLAN:  PT FREQUENCY: 2x/week  PT DURATION: 8 weeks  PLANNED INTERVENTIONS: Therapeutic exercises, Therapeutic activity,  Neuromuscular re-education, Balance training, Gait training, Patient/Family education, Self Care, Joint mobilization, Stair training, DME instructions, Aquatic Therapy, Electrical stimulation, Cryotherapy, Moist heat, Taping, Ionotophoresis 466mml Dexamethasone, Manual therapy, and Re-evaluation  PLAN FOR NEXT SESSION: general strength, balance, functional activity tolerance    Landrey Mahurin U PT DPT PN2  09/10/2022, 10:17 AM   PHYSICAL THERAPY DISCHARGE SUMMARY  Visits from Start of Care: 1  Current functional level related to goals / functional outcomes: See above   Remaining deficits: See above   Education / Equipment: Anatomy of condition, POC, HEP, exercise form/rationale   Patient agrees to discharge. Patient goals were not met. Patient is being discharged due to a change in medical status. Jessica C. Hightower PT, DPT 11/25/22 9:03 AM

## 2022-09-13 ENCOUNTER — Other Ambulatory Visit (HOSPITAL_BASED_OUTPATIENT_CLINIC_OR_DEPARTMENT_OTHER): Payer: Self-pay

## 2022-09-13 MED ORDER — VALACYCLOVIR HCL 1 G PO TABS
2000.0000 mg | ORAL_TABLET | Freq: Two times a day (BID) | ORAL | 1 refills | Status: DC
  Filled 2022-09-13: qty 30, 8d supply, fill #0
  Filled 2022-11-06: qty 30, 8d supply, fill #1

## 2022-09-15 ENCOUNTER — Other Ambulatory Visit (HOSPITAL_BASED_OUTPATIENT_CLINIC_OR_DEPARTMENT_OTHER): Payer: Self-pay

## 2022-09-15 DIAGNOSIS — Z23 Encounter for immunization: Secondary | ICD-10-CM | POA: Diagnosis not present

## 2022-09-15 MED ORDER — AREXVY 120 MCG/0.5ML IM SUSR
INTRAMUSCULAR | 0 refills | Status: DC
  Filled 2022-09-15: qty 0.5, 1d supply, fill #0

## 2022-09-15 MED ORDER — PREVNAR 20 0.5 ML IM SUSY
PREFILLED_SYRINGE | INTRAMUSCULAR | 0 refills | Status: DC
  Filled 2022-09-15: qty 0.5, 1d supply, fill #0

## 2022-09-17 ENCOUNTER — Ambulatory Visit (HOSPITAL_COMMUNITY)
Admission: RE | Admit: 2022-09-17 | Discharge: 2022-09-17 | Disposition: A | Discharge status: Home / Self Care | Payer: Medicare Other | Source: Ambulatory Visit | Attending: Gastroenterology | Admitting: Gastroenterology

## 2022-09-17 DIAGNOSIS — R7989 Other specified abnormal findings of blood chemistry: Secondary | ICD-10-CM | POA: Diagnosis not present

## 2022-09-17 DIAGNOSIS — K828 Other specified diseases of gallbladder: Secondary | ICD-10-CM | POA: Diagnosis not present

## 2022-09-17 DIAGNOSIS — R945 Abnormal results of liver function studies: Secondary | ICD-10-CM | POA: Diagnosis not present

## 2022-09-22 ENCOUNTER — Other Ambulatory Visit (HOSPITAL_BASED_OUTPATIENT_CLINIC_OR_DEPARTMENT_OTHER): Payer: Self-pay

## 2022-09-22 ENCOUNTER — Other Ambulatory Visit: Payer: Self-pay

## 2022-09-23 ENCOUNTER — Encounter: Payer: Self-pay | Admitting: Psychiatry

## 2022-09-23 ENCOUNTER — Other Ambulatory Visit (HOSPITAL_BASED_OUTPATIENT_CLINIC_OR_DEPARTMENT_OTHER): Payer: Self-pay

## 2022-09-23 ENCOUNTER — Ambulatory Visit (INDEPENDENT_AMBULATORY_CARE_PROVIDER_SITE_OTHER): Payer: Medicare Other | Admitting: Psychiatry

## 2022-09-23 DIAGNOSIS — G250 Essential tremor: Secondary | ICD-10-CM | POA: Diagnosis not present

## 2022-09-23 DIAGNOSIS — F411 Generalized anxiety disorder: Secondary | ICD-10-CM | POA: Diagnosis not present

## 2022-09-23 DIAGNOSIS — Z79899 Other long term (current) drug therapy: Secondary | ICD-10-CM

## 2022-09-23 DIAGNOSIS — F061 Catatonic disorder due to known physiological condition: Secondary | ICD-10-CM | POA: Diagnosis not present

## 2022-09-23 DIAGNOSIS — I2510 Atherosclerotic heart disease of native coronary artery without angina pectoris: Secondary | ICD-10-CM

## 2022-09-23 DIAGNOSIS — G251 Drug-induced tremor: Secondary | ICD-10-CM

## 2022-09-23 DIAGNOSIS — F3175 Bipolar disorder, in partial remission, most recent episode depressed: Secondary | ICD-10-CM | POA: Diagnosis not present

## 2022-09-23 DIAGNOSIS — G3184 Mild cognitive impairment, so stated: Secondary | ICD-10-CM

## 2022-09-23 DIAGNOSIS — R7989 Other specified abnormal findings of blood chemistry: Secondary | ICD-10-CM

## 2022-09-23 NOTE — Progress Notes (Addendum)
Meghan Oliver [id number] Dec 20, 1940 81 y.o.  Subjective:   Patient ID:  Meghan Oliver is a 81 y.o. (DOB July 15, 1941) female. Patient was last seen August 21, 2018 Chief Complaint:  Chief Complaint  Patient presents with   Follow-up   Depression   Kidder presents to the office today for follow-up of Severe TR bipolar depression with psychotic features including catatonia.  11/30/2018 was the last visit and the following was noted: Had ECT yesterday at [facility].  Last was Jan 8 and was doing well then.  Next scheduled April 15.  Had a lithium level from there which is pending. Pretty good until the last 10 days to 2 weeks with less energy and motivation.  Bothered by old sick dog.  Worried about how that will affect her.  Has slept with the dog.  Enjoyed [place].  Going to gym and playing [place].  Took the class and didn't feel she caught on quickly. Wonders if it is bc of the ECT memory effects.  Bridge is harder and not playing.  Planning to return to [facility] mid April.   spent the winter in [place] per usual..  No significant depression since here. ECT frequency about 6 weeks.  Gets very anxious the day of the ECT.  ECT consistently for a year.  Best in mood in 7-8 years.  Also the pacemaker helped.  Friends notice the benefit.  Plan:  Option increase in the nortriptyline if needed.  Considered this.  They agree and would like to increase nortriptyline to 75 to try to reduce tendency to depression before the ECT.   03/19/19  Addendum: Here the contents from an email from the nurse for Ms. Shipman  Hello [name],   I hope you and your family are healthy and well. I took Janani to [facility] for ECT and here are labs from 01/26/19. I am going to also forward them to [facility] for follow up with her LFT's. They are still higher than I would like.  Her lithium level was 0.51 on a daily dose of '300mg'$  qohs x 4 nights- alternating with '450mg'$  qohs x 3 nights. She is not  demonstrating any signs of elevated levels, minimal hand tremors, less anxiety and restlessness since her ECT treatment.  Her Nortryptyline level was 140 on '75mg'$  qhs.  I would like to continue her current dosages of both the above meds and recheck her lithium level at her next scheduled ECT of 03/23/19.  Please let me know of any changes you would like to make. Take care, [name] RN Lithium level was 0.4 on the '450mg'$  alt with 300 mg QOD. No changes were made in meds.  11/15/2019 phone call: Telephone call from patient's husband [name] on 11/14/2019  Patient is scheduled for ECT soon for maintenance ECT.  He thinks it is been 2-1/2 months since the last ECT but he is going to check the date for sure.  It is still be done being done at [facility].  He is wondering about skipping this treatment because the patient has continued to be free of depression and seems cognitively clearer as these ECT treatments have been spread out further from each other.  Patient remains on medications as prescribed.  If it is truly been over 2 months since the last ECT then it is reasonable to consider discontinuing ECT.  It is unlikely that ECT with the frequency of greater than  2 months is significantly helpful at preventing her relapse.  If however the ECT frequency is less than every 2 months it could still be helping to prevent recurrence.  He indicated he would consider this information and discuss it with her ECT team and make a decision.  They are heading to [place] in the next few weeks.  Needs to schedule an appointment with me for follow-up because it is been many months.  He agrees.   02/19/2020 appt, the following noted: Moving to [place] in a few months.  Ready to downsize.   Stopped ECT as discussed and has not been more deprressed.   Walks dog 4 times daily.   Lithium 0.7 on  01/30/20 on lithium 300 mg plus 150 mg on M, W, F Nortriptyline 70 on 75 mg HS. No SE except tremor.  Balance is not  great.  Very happy.   No depression since here.  No mood swings.  Sleep and appetite is OK.  No med changes:  04/03/2020 TC with the following noted: Patient's husband called stating that her tremor was a little worse and she was having some stutter. No evidence of stroke otherwise. First thing would be to check serum lithium level and BMP.  Order written.  Have asked her husband to let us know about the lab to which it should be sent.  04/22/20 TC witht he following noted: Lithium level is not dangerously high but it is has crept up to 1.0 which is higher than desired for her.  Our goal for her is 0.5-0.7.  At the current level it is likely causing side effects so we should reduce the dosage from lithium 300 mg plus 150 mg on M, W, F  TO 300 mg daily.  Meaning drop off the 150 mg capsule.  It will take about 2 weeks for the level to gradually come down to the desired level.  05/08/2020 appt with the following noted: Seen with H and [name] nurse. Moving to [facility] and feels OK about it.  I think I'll be fine.   Pretty good with balance.  Doing yoga and it helps. [name] has cancer. Old dog 71 & 1/2 yo still living. [name]'s concerns when went to [place] for D's BD she had difficulty time with tremor lethargy, more confusion.  Better when go back home. [name] notes using more lorazepam. At times gait is unsteady. Not significantly depressed.  Can be anxious at times.  Eating okay.  Sleeping okay.  Is easily confused under stress. Plan: reduce lithium to 300 mg nightly.  07/14/20 appt with the following noted: Seen with her husband as well as her family nurse. Feeling fine and pleased with that.   Some anxiety over the move to smaller place. [name]'s twin also having heart problems Gene. [name]'s kids came and visited.    Plan: Continue nortriptyline 75 mg, paroxetine 20 mg, lithium 300 mg, Namenda 10 mg twice daily, lorazepam 0.5 mg 1/2-1 3 times daily as needed, mirtazapine 7.5 mg  nightly  02/27/2021 appointment with the following noted:  Seen with H and nurse [place] email from nurse [name] on 02/24/2021 indicated that he had moved into [facility] in May.  Riese has gradually become more depressed and more anxious using lorazepam as prescribed 3 times daily.  More memory issues.  She has remained engaged and initiating appropriate conversation.  She is very worried about her 26 year old dog who is in poor health and fears she will  become more depressed when the dog dies.  She and her husband do not wish to prefer to pursue further ECT unless absolutely necessary. Likes [facility] so far.   No depression by her report.  Sold big house in Feb.  It worked out well.   Worries about her dog 24 yo will die soon. H agrees she's done well with depression.   Enjoys things and has interests. Plan: Continue Lithium 300 mg daily  Continue nortriptyline 75 mg daily Continue paroxetine 20 mg daily Continue lorazepam 0.5 mg 3 times daily for both anxiety and catatonia Continue vitamin B6 and primidone for tremor Cerefolin NAC 2 daily.  05/13/2021 appointment with the following noted: Happy with [place].  H and she agree is doing well with depression.Kermit Balo socialization. She complains of memory.   Family visited and getting along with Baker Janus better.  H says Baker Janus is acting better.  Goes to dinner and activities together now.  Better relationship with Gene.. Tremor has been OK.   Ativan usually 0.25 mg TID and tolerated. Anxiety managed with this generally. Molly dog health more stable. To [place] before T'giving until April.   Plan: Continue Lithium 300 mg daily  Continue nortriptyline 75 mg daily Continue paroxetine 20 mg daily Continue lorazepam 0.5 mg 3 times daily for both anxiety and catatonia Continue vitamin B6 and primidone for tremor   08/14/2021 appointment with the following noted: Seen with her husband [name] and nurse [name]. Everyone reports that  her mood has been stable.  Her anxiety is manageable with as needed lorazepam.  She is still happy with the transition to [facility].  They are preparing to go to [place] for the winter per usual but may sell their house father there. Tolerating meds without any unusual side effects. Is dealing with grief over the loss of her dog Molly.  03/10/2022 appt noted:  seen with H and nurse Vicie Mutters [facility] house. [name] not good phsyically hard to play golf.  Been really hard. He'll be 85 in July.  Some stress scheduling with D's.   25 th wedding necessary this month.   Mood up and down.  Sister in law across the street can be difficult and demeaning.  She feels unsettled.  Some worry. Some back pain limiting activity.   Sleep is ok except with pain awakening.   No SE, except tremor some worse about 3-5 and helped by lorazepam 0.25 mg then No greater than 1 mg lorazepam daily Patient denies any recent difficulty with anxiety except as noted.  Patient denies difficulty with sleep initiation or maintenance. Denies appetite disturbance.    Patient has some difficulty with concentration.  Patient denies any suicidal ideation.  Usually takes Ativan at night and sleeps well 8 hours.   Plan no med changes  10/23/213 appt noted: Still doing well. Pneumonia and hosp for 6 days 3 weeks ago.  Viral.  Improving at this time. Increased tremor gradually in 3-4 mos helped by lorazepam avg 1.00-1.25 mg daily. No change in primidone. Total R hip August 2023 and done well.  Finishing home PT.   Appetite poor for 7-8 mos.  2 protein drinks daily but may not eat more than 1 good meal a day. [facility]. Substantial memory problems in hospital and in unusual enviornments. Nurse notices decline.  B with LBD passed in August. No pain meds or muscle relaxants beyond pain. No depression.  H note sometimes she has less motivation than others. [name] notes some normal waxing and  waning of mood. Good social interaction.   Not  spending winter in [facility] this year.  D still lives there. Plan: Increase mirtazapine 15 mg HS for appetite.  Also used for sleep and depression.  09/23/22 appt noted:  with nurse and [name] [name] feeling bad with more pain and fatigue.  [name] has CHF.   Made Janaiya sad.  Lost a cgood friends.  Rough time.  Mostly worry about [name].  She feels afraid to be alone if something happens.   Not sig depressed.    Just worry . H can't be as active.  Taking a shower is hard.   She's back exercising again.   Went to [employer] for Thanksgiving in [facility] and plan to go back for Christmas. It's hard to travel. Told nurse she was depressed. Appetite is better and once asleep is OK. Using more lorazepam for anxiety 3-4 daily.  No current alcohol problems  Saw Dr. Carles Collet last week and no change in neuro meds.  Past Psychiatric Medication Trials: Prozac with lithium and Zyprexa 2.5 to 5 mg, nortriptyline, paroxetine, ,  pramipexole, mirtazapine, Namenda, lorazepam,  Rexulti, Abilify, Vraylar, Latuda 10 mg, lamotrigine.   this loss list is not exhaustive  Review of Systems:  Review of Systems  Constitutional:  Negative for fatigue and unexpected weight change.  Respiratory:  Negative for shortness of breath.   Cardiovascular:  Negative for palpitations.  Gastrointestinal:  Positive for constipation.       Linzess and Mozantic managed   Musculoskeletal:  Positive for gait problem.  Neurological:  Negative for tremors and weakness.       Some balance issues  Psychiatric/Behavioral:  Positive for decreased concentration. Negative for agitation, behavioral problems, confusion, dysphoric mood, hallucinations, self-injury, sleep disturbance and suicidal ideas. The patient is nervous/anxious. The patient is not hyperactive.     Medications: I have reviewed the patient's current medications.  Current Outpatient Medications  Medication Sig Dispense Refill   amoxicillin (AMOXIL) 500 MG capsule Take 3 capsules (1,500 mg total)  by mouth 1 hour prior to dental work. 9 capsule 1   aspirin 81 MG chewable tablet Chew 1 tablet (81 mg total) by mouth daily.     Calcium Carbonate-Vitamin D (CALCIUM 600/VITAMIN D PO) Take 1 tablet by mouth 2 (two) times daily.     cetirizine (ZYRTEC) 10 MG tablet Take 10 mg by mouth daily as needed for allergies.     Cholecalciferol (VITAMIN D3) 50 MCG (2000 UT) TABS Take 2,000 Units by mouth in the morning.     COVID-19 mRNA vaccine 2023-2024 (COMIRNATY) syringe Inject into the muscle. 0.3 mL 0   dexlansoprazole (DEXILANT) 60 MG capsule Take 1 capsule by mouth daily (Patient taking differently: Take 60 mg by mouth daily.) 90 capsule 3   fesoterodine (TOVIAZ) 8 MG TB24 tablet Take 1 tablet (8 mg total) by mouth daily. 90 tablet 3   glycerin adult 2 g suppository Use 1 suppository as needed if you have not had a bowel movement in  4 to 5 days 25 suppository 0   influenza vaccine adjuvanted (FLUAD QUADRIVALENT) 0.5 ML injection Inject into the muscle. 0.5 mL 0   Krill Oil 500 MG CAPS Take 500 mg by mouth daily.     lactulose (CHRONULAC) 10 GM/15ML solution Take 30 mLs (20 g total) by mouth 3 (three) times daily between meals as needed for mild constipation. (Patient taking differently: Take 20 g by mouth daily as needed for mild constipation.) 473 mL 3  Lavender Oil 80 MG CAPS Take 160 mg by mouth at bedtime.     levothyroxine (SYNTHROID) 75 MCG tablet Take 1 tablet (75 mcg total) by mouth daily. 90 tablet 2   linaclotide (LINZESS) 72 MCG capsule Take 2 capsules (144 mcg total) by mouth daily before breakfast. 180 capsule 1   LORazepam (ATIVAN) 0.5 MG tablet TAKE 1/2 TO 1 TABLET BY MOUTH EVERY NIGHT AT BEDTIME AND 1 TABLET EVERY 8 HOURS AS NEEDED FOR ANXIETY (Patient taking differently: Take 0.25-0.5 mg by mouth See admin instructions. TAKE 1/2 TO 1 TABLET BY MOUTH EVERY NIGHT AT BEDTIME AND 1 TABLET EVERY 8 HOURS AS NEEDED FOR ANXIETY) 180 tablet 1   magic mouthwash w/lidocaine SOLN Take 5 mLs by  mouth 3 (three) times daily as needed for mouth pain. 120 mL 0   memantine (NAMENDA) 10 MG tablet Take 1 tablet (10 mg total) by mouth 2 (two) times daily. 180 tablet 1   mirtazapine (REMERON) 15 MG tablet Take 1 tablet (15 mg total) by mouth at bedtime. 90 tablet 1   Multiple Vitamins-Minerals (MULTIVITAMIN WITH MINERALS) tablet Take 1 tablet by mouth daily.     Multiple Vitamins-Minerals (PRESERVISION AREDS 2 PO) Take 1 capsule by mouth in the morning and at bedtime.     naloxegol oxalate (MOVANTIK) 12.5 MG TABS tablet Take 2 tablets (25 mg total) by mouth daily. 60 tablet 3   nortriptyline (PAMELOR) 75 MG capsule Take 1 capsule (75 mg total) by mouth at bedtime. 90 capsule 1   ondansetron (ZOFRAN-ODT) 4 MG disintegrating tablet Place 1 tablet every 6 hours by translingual route as needed. 20 tablet 0   PARoxetine (PAXIL) 20 MG tablet TAKE 1 TABLET(20 MG) BY MOUTH AT BEDTIME 90 tablet 0   pneumococcal 20-valent conjugate vaccine (PREVNAR 20) 0.5 ML injection Inject into the muscle. 0.5 mL 0   polyethylene glycol (MIRALAX / GLYCOLAX) 17 g packet Take 17 g by mouth daily as needed for mild constipation.     primidone (MYSOLINE) 50 MG tablet TAKE 2 TABLETS BY MOUTH EVERY MORNING AND 1 TABLET EVERY EVENING 270 tablet 0   Pyridoxine HCl (VITAMIN B-6) 500 MG tablet Take 500 mg by mouth 2 (two) times daily.     RSV vaccine recomb adjuvanted (AREXVY) 120 MCG/0.5ML injection Inject into the muscle. 0.5 mL 0   sennosides-docusate sodium (SENOKOT-S) 8.6-50 MG tablet Take 2 tablets by mouth as needed.     valACYclovir (VALTREX) 1000 MG tablet Take 2 tablets by mouth at onset, then 2 tablets 12 hours later. 30 tablet 1   vitamin B-12 (CYANOCOBALAMIN) 1000 MCG tablet Take 1,000 mcg by mouth at bedtime.     lithium carbonate 300 MG capsule Take 1 capsule (300 mg total) by mouth at bedtime. 90 capsule 0   No current facility-administered medications for this visit.    Medication Side Effects: Other: tremor   stable.  No worse.  Allergies:  Allergies  Allergen Reactions   Propranolol Other (See Comments)    Low blood pressure. Bradycardia    Brexpiprazole Other (See Comments)    Aphasia and catatonia    Past Medical History:  Diagnosis Date   Anxiety    Arthritis    "hips, spine" (03/17/2018)   Bipolar II disorder (HCC)    CHF (congestive heart failure) (HCC)    Chronic bronchitis (HCC)    Chronic lower back pain    Chronic right hip pain    CKD (chronic kidney disease), stage II  Coronary artery disease    stent x1   Esophagitis, erosive    GAD (generalized anxiety disorder)    GERD (gastroesophageal reflux disease)    Headache    "maybe monthly" (03/17/2018))   Heart murmur, systolic    History of adenomatous polyp of colon    08-04-2016  tubular adenoma   History of blood transfusion 12/2017   "related to vascular hematoma"   History of electroconvulsive therapy    at [facility]--  started 04-15-2015 to 11-17-2016  total greater than 40 times   History of hiatal hernia    Hyperlipidemia    Hypertension    Hypothyroidism    Internal carotid artery stenosis, bilateral    per last duplex 05-01-2014  bilateral ICA 40-59%   Major depression, chronic    ECT treatments extensive and multiple started 07/ 2016   Memory loss    "both short and long-term; needs frequent reminders to follow instrucitons" (05/16/2017)   Migraines    "none in years" (03/17/2018)   OSA (obstructive sleep apnea)    per study 06/ 2012 moderate OSA  ; "refuses to wear masks" (03/17/2018)   Osteoporosis    Pneumonia 07/29/2022   Poor historian    due to short term memory loss   Presence of permanent cardiac pacemaker 03/17/2018   Pulmonary nodule    monitored by pcp   S/P placement of cardiac pacemaker 03/17/18 [facility]  03/18/2018   Short-term memory loss    Sick sinus syndrome (HCC)     Family History  Problem Relation Age of Onset   Heart attack Father 13       deceased   Hypertension Father     Heart disease Father    Dementia Brother    Parkinson's disease Brother    Heart disease Brother    Breast cancer Paternal Aunt        Age 58's   Breast cancer Paternal Grandmother        Age unknown   Colon cancer Neg Hx     Social History   Socioeconomic History   Marital status: Married    Spouse name: Dr. Lyla Son   Number of children: 2   Years of education: Not on file   Highest education level: Not on file  Occupational History   Occupation: housewife    Employer: UNEMPLOYED  Tobacco Use   Smoking status: Former    Packs/day: 2.00    Years: 15.00    Total pack years: 30.00    Types: Cigarettes    Quit date: 01/13/1971    Years since quitting: 51.7    Passive exposure: Never   Smokeless tobacco: Never  Vaping Use   Vaping Use: Never used  Substance and Sexual Activity   Alcohol use: Yes    Comment: occassional 1 x a week   Drug use: Never   Sexual activity: Not Currently    Comment: intercourse age 89, sexual partners less than 5  Other Topics Concern   Not on file  Social History Narrative   Right handed   Social Determinants of Health   Financial Resource Strain: Not on file  Food Insecurity: No Food Insecurity (07/06/2022)   Hunger Vital Sign    Worried About Running Out of Food in the Last Year: Never true    Ran Out of Food in the Last Year: Never true  Transportation Needs: No Transportation Needs (07/06/2022)   PRAPARE - Hydrologist (Medical):  No    Lack of Transportation (Non-Medical): No  Physical Activity: Not on file  Stress: Not on file  Social Connections: Not on file  Intimate Partner Violence: Not At Risk (07/06/2022)   Humiliation, Afraid, Rape, and Kick questionnaire    Fear of Current or Ex-Partner: No    Emotionally Abused: No    Physically Abused: No    Sexually Abused: No    Past Medical History, Surgical history, Social history, and Family history were reviewed and updated as appropriate.    Please see review of systems for further details on the patient's review from today.   Objective:   Physical Exam:  LMP  (LMP Unknown)   Physical Exam Constitutional:      General: She is not in acute distress.    Appearance: She is well-developed.  Musculoskeletal:        General: No deformity.  Neurological:     Mental Status: She is alert and oriented to person, place, and time.     Motor: No tremor.     Coordination: Coordination abnormal.     Comments: Cane use  Psychiatric:        Attention and Perception: She is attentive.        Mood and Affect: Mood is anxious. Mood is not depressed. Affect is not labile, blunt, angry or inappropriate.        Speech: Speech is not slurred.        Behavior: Behavior normal. Behavior is not slowed or withdrawn.        Thought Content: Thought content normal. Thought content is not delusional. Thought content does not include homicidal or suicidal ideation. Thought content does not include suicidal plan.        Cognition and Memory: She exhibits impaired recent memory.        Judgment: Judgment normal.     Comments: Insight intact. No auditory or visual hallucinations.  meticulous appearance bright affect Mood and cognitive status are stable. Good fund of knowledge and memory better than in the past.  Pretty stable Gait better      Lab Review:     Component Value Date/Time   NA 138 07/20/2022 1159   NA 136 04/20/2017 1151   K 4.6 07/20/2022 1159   CL 106 07/20/2022 1159   CO2 25 07/20/2022 1159   GLUCOSE 86 07/20/2022 1159   BUN 26 (H) 07/20/2022 1159   BUN 24 04/20/2017 1151   CREATININE 0.82 07/20/2022 1159   CALCIUM 9.4 07/20/2022 1159   PROT 6.7 09/02/2022 1221   PROT 6.2 04/20/2017 1151   ALBUMIN 3.9 09/02/2022 1221   ALBUMIN 3.9 04/20/2017 1151   AST 77 (H) 09/02/2022 1221   ALT 132 (H) 09/02/2022 1221   ALKPHOS 64 09/02/2022 1221   BILITOT 0.3 09/02/2022 1221   BILITOT <0.2 04/20/2017 1151   GFRNONAA >60  07/08/2022 0429   GFRNONAA 50 (L) 07/10/2020 1021   GFRAA 58 (L) 07/10/2020 1021       Component Value Date/Time   WBC 7.9 07/20/2022 1159   RBC 3.11 (L) 07/20/2022 1159   HGB 10.0 (L) 07/20/2022 1159   HGB 10.2 (L) 04/20/2017 1151   HCT 28.8 (L) 07/20/2022 1159   HCT 29.6 (L) 04/20/2017 1151   PLT 352 07/20/2022 1159   PLT 385 (H) 04/20/2017 1151   MCV 92.6 07/20/2022 1159   MCV 89 04/20/2017 1151   MCH 32.2 07/20/2022 1159   MCHC 34.7 07/20/2022 1159   RDW 13.1  07/20/2022 1159   RDW 13.9 04/20/2017 1151   LYMPHSABS 1,588 07/20/2022 1159   LYMPHSABS 1.5 04/20/2017 1151   MONOABS 0.7 07/07/2022 0458   EOSABS 237 07/20/2022 1159   EOSABS 0.3 04/20/2017 1151   BASOSABS 40 07/20/2022 1159   BASOSABS 0.0 04/20/2017 1151    Lithium Lvl  Date Value Ref Range Status  08/02/2022 0.5 (L) 0.6 - 1.2 mmol/L Final  Nortriptyline level 51 on 50 mg nightly with paroxetine 20 mg daily  07/10/2020 lithium level 0.6 at [facility] office and nortriptyline level 130 on the current dosages of lithium 300 mg nightly and nortriptyline 75 mg nightly  02/20/2021 labs lithium 0.6 on 300 mg nightly.  Nortriptyline level 49 which is lower than expected on 75 mg nightly along with paroxetine 20 mg daily.    No results found for: "PHENYTOIN", "PHENOBARB", "VALPROATE", "CBMZ"   .res Assessment: Plan:    Bipolar I disorder, mild, current or most recent episode depressed, in partial remission, with catatonia ([place])  Generalized anxiety disorder  Catatonia  Mild cognitive impairment  Lithium-induced tremor  Essential tremor  Low vitamin D level  Lithium use    History of lithium toxicity repeatedly.  Greater than 50% of 50 min face to face time with patient her nurse and her husband was spent on counseling and coordination of care.  Her case has been highly complex and treatment resistant at times.  Her severe treatment resistant bipolar depression is currently in remission.  We  discussed her long history of extremely severe treatment resistant major depression.  During the severe depression she has catatonia.  She had required longterm ECT.  A previous episode of depression approximately 10 years ago stayed in remission on Paxil and nortriptyline and lithium for a period of years until the lithium level was decreased due to tremor.  Now we are maintaining an adequate lithium level.  She has had several episodes of lithium toxicity with hospitalization.    From records it appears her last ECT was in September 2020.  Her depression has remained in remission since that time. Catatonia is resolved.  Depression and anxiety much improved.  Memory seems to be stable if not better.    She seems to be tolerating the meds well other than the lithium tremor which is being fairly  managed with primidone.  She is continuing under the care of her neurologist Dr. Carles Collet.  The nortriptyline was increased to 75 to try to reduce tendency to depression in February 2020.  Need repeat level. 08/02/22 nortriptyline 85, lihtium 0.5  Check labs every 4-6 mos.  Lithium level 0.5 on 03/05/22 on 300 mg HS is ideal with little risk toxicity bc low normal dose.  Disc the frequency of lithium labs. Need repeat level bc last was right after hospitalization with pneumonia.   Lithium level goals are between 0.5 and 0.7.  She has a history of lithium toxicity when she was taking chlorthalidone with the lithium.   Discussed signs and symptoms of lithium toxicity.  Discussed the risk of increasing depression if the lithium level gets too low.  Lithium tremor is overall little better with the reduction in lithium. She cannot tolerate higher doses of lithium.  Memory is mildly and gradually worse noted by nurse.  Overall memory appears to be stable and she has reasonable recall for recent family events.  There remains some concern about drug interactions between paroxetine and nortriptyline and given her age we  do not want the nortriptyline  levels higher than necessary .  However the nortriptyline level was not high. Disc risk polypharmacy  Continue Lithium 300 mg daily  Continue nortriptyline 75 mg daily Continue paroxetine 20 mg daily Continue lorazepam 0.5 mg 3 times daily for both anxiety and catatonia Continue vitamin B6 and primidone for tremor Increased and it helped mirtazapine 15 mg HS for appetite.  Also used for sleep and depression. Repeat lithium level and nortriptyline level DT tremor worse-  labs were good and tremor is manageable now  This appointment was 50 minutes.   Lynder Parents MD, DFAPA Please see After Visit Summary for patient specific instructions.  Future Appointments  Date Time Provider [place]  10/11/2022  9:30 AM Carney Living, PT [facility]  10/12/2022  2:00 PM [facility]CT 1 [facility]  10/13/2022  9:30 AM Carney Living, PT [facility]  10/18/2022 11:00 AM Carney Living, PT [facility]  10/20/2022 10:15 AM Carney Living, PT [facility]  10/25/2022  9:30 AM Carney Living, PT [facility]  10/25/2022 10:30 AM Melvenia Beam, MD [facility] None  10/27/2022 10:15 AM Carney Living, PT [facility]  11/01/2022  9:30 AM Carney Living, PT [facility]  11/03/2022  9:30 AM Carney Living, PT [facility]  11/08/2022  9:30 AM Carney Living, PT [facility]  11/10/2022  9:30 AM Carney Living, PT [facility]  11/15/2022  9:30 AM Carney Living, PT [facility]  11/17/2022  9:30 AM Carney Living, PT [facility]  11/22/2022  9:30 AM Carney Living, PT [facility]  11/24/2022  9:30 AM Carney Living, PT [facility]  11/24/2022 10:30 AM Cottle, Billey Co., MD CP-CP None  11/29/2022  9:30 AM Carney Living, PT [facility]  12/01/2022  9:30 AM Carney Living, PT [facility]  12/03/2022 11:00 AM Mauri Pole, MD [facility]  01/03/2023  3:20 PM [facility] DEVICE REMOTES [facility]  04/04/2023  3:20 PM [facility] DEVICE REMOTES  [facility]  07/04/2023  3:20 PM [facility] DEVICE REMOTES [facility]  10/03/2023  7:00 AM [facility] DEVICE REMOTES [facility]      No orders of the defined types were placed in this encounter.      -------------------------------

## 2022-09-30 ENCOUNTER — Other Ambulatory Visit: Payer: Self-pay | Admitting: Psychiatry

## 2022-09-30 ENCOUNTER — Other Ambulatory Visit (HOSPITAL_BASED_OUTPATIENT_CLINIC_OR_DEPARTMENT_OTHER): Payer: Self-pay

## 2022-09-30 DIAGNOSIS — F3181 Bipolar II disorder: Secondary | ICD-10-CM

## 2022-09-30 MED ORDER — LITHIUM CARBONATE 300 MG PO CAPS
300.0000 mg | ORAL_CAPSULE | Freq: Every day | ORAL | 0 refills | Status: DC
  Filled 2022-11-06: qty 90, 90d supply, fill #0

## 2022-10-05 ENCOUNTER — Other Ambulatory Visit: Payer: Self-pay | Admitting: Psychiatry

## 2022-10-05 ENCOUNTER — Encounter: Payer: Self-pay | Admitting: Psychiatry

## 2022-10-05 ENCOUNTER — Other Ambulatory Visit (HOSPITAL_BASED_OUTPATIENT_CLINIC_OR_DEPARTMENT_OTHER): Payer: Self-pay

## 2022-10-05 DIAGNOSIS — F3181 Bipolar II disorder: Secondary | ICD-10-CM

## 2022-10-05 MED ORDER — NORTRIPTYLINE HCL 75 MG PO CAPS
75.0000 mg | ORAL_CAPSULE | Freq: Every day | ORAL | 0 refills | Status: DC
  Filled 2022-10-05: qty 90, 90d supply, fill #0

## 2022-10-11 ENCOUNTER — Other Ambulatory Visit (HOSPITAL_BASED_OUTPATIENT_CLINIC_OR_DEPARTMENT_OTHER): Payer: Self-pay

## 2022-10-11 ENCOUNTER — Ambulatory Visit (HOSPITAL_BASED_OUTPATIENT_CLINIC_OR_DEPARTMENT_OTHER): Payer: Medicare Other | Admitting: Physical Therapy

## 2022-10-11 ENCOUNTER — Other Ambulatory Visit: Payer: Self-pay | Admitting: Psychiatry

## 2022-10-11 ENCOUNTER — Encounter: Payer: Self-pay | Admitting: Internal Medicine

## 2022-10-11 ENCOUNTER — Ambulatory Visit (INDEPENDENT_AMBULATORY_CARE_PROVIDER_SITE_OTHER): Payer: Medicare Other | Admitting: Internal Medicine

## 2022-10-11 VITALS — BP 124/66 | HR 72 | Temp 99.9°F

## 2022-10-11 DIAGNOSIS — E7800 Pure hypercholesterolemia, unspecified: Secondary | ICD-10-CM

## 2022-10-11 DIAGNOSIS — R509 Fever, unspecified: Secondary | ICD-10-CM

## 2022-10-11 DIAGNOSIS — Z950 Presence of cardiac pacemaker: Secondary | ICD-10-CM | POA: Diagnosis not present

## 2022-10-11 DIAGNOSIS — J189 Pneumonia, unspecified organism: Secondary | ICD-10-CM | POA: Diagnosis not present

## 2022-10-11 DIAGNOSIS — R5383 Other fatigue: Secondary | ICD-10-CM | POA: Diagnosis not present

## 2022-10-11 DIAGNOSIS — Z9882 Breast implant status: Secondary | ICD-10-CM

## 2022-10-11 DIAGNOSIS — R413 Other amnesia: Secondary | ICD-10-CM | POA: Diagnosis not present

## 2022-10-11 DIAGNOSIS — N3281 Overactive bladder: Secondary | ICD-10-CM

## 2022-10-11 DIAGNOSIS — D649 Anemia, unspecified: Secondary | ICD-10-CM | POA: Diagnosis not present

## 2022-10-11 DIAGNOSIS — R82998 Other abnormal findings in urine: Secondary | ICD-10-CM | POA: Diagnosis not present

## 2022-10-11 DIAGNOSIS — E039 Hypothyroidism, unspecified: Secondary | ICD-10-CM | POA: Diagnosis not present

## 2022-10-11 DIAGNOSIS — I10 Essential (primary) hypertension: Secondary | ICD-10-CM | POA: Diagnosis not present

## 2022-10-11 DIAGNOSIS — M1612 Unilateral primary osteoarthritis, left hip: Secondary | ICD-10-CM

## 2022-10-11 DIAGNOSIS — J069 Acute upper respiratory infection, unspecified: Secondary | ICD-10-CM

## 2022-10-11 DIAGNOSIS — Z0189 Encounter for other specified special examinations: Secondary | ICD-10-CM

## 2022-10-11 DIAGNOSIS — F3181 Bipolar II disorder: Secondary | ICD-10-CM

## 2022-10-11 DIAGNOSIS — E611 Iron deficiency: Secondary | ICD-10-CM | POA: Diagnosis not present

## 2022-10-11 DIAGNOSIS — G3184 Mild cognitive impairment, so stated: Secondary | ICD-10-CM

## 2022-10-11 DIAGNOSIS — Z8659 Personal history of other mental and behavioral disorders: Secondary | ICD-10-CM

## 2022-10-11 LAB — POCT URINALYSIS DIPSTICK
Bilirubin, UA: NEGATIVE
Blood, UA: NEGATIVE
Glucose, UA: NEGATIVE
Ketones, UA: NEGATIVE
Nitrite, UA: NEGATIVE
Protein, UA: NEGATIVE
Spec Grav, UA: <=1.005 — AB (ref 1.010–1.025)
Urobilinogen, UA: 0.2 E.U./dL
pH, UA: 7 (ref 5.0–8.0)

## 2022-10-11 LAB — POC COVID19 BINAXNOW: SARS Coronavirus 2 Ag: NEGATIVE

## 2022-10-11 LAB — POCT INFLUENZA A/B
Influenza A, POC: NEGATIVE
Influenza B, POC: NEGATIVE

## 2022-10-11 MED ORDER — MEMANTINE HCL 10 MG PO TABS
10.0000 mg | ORAL_TABLET | Freq: Two times a day (BID) | ORAL | 1 refills | Status: DC
  Filled 2022-10-11: qty 180, 90d supply, fill #0
  Filled 2023-01-04: qty 180, 90d supply, fill #1

## 2022-10-11 MED ORDER — AZITHROMYCIN 250 MG PO TABS
ORAL_TABLET | ORAL | 0 refills | Status: AC
  Filled 2022-10-11: qty 6, 5d supply, fill #0

## 2022-10-11 NOTE — Progress Notes (Signed)
   Subjective:    Patient ID: Meghan Oliver, female    DOB: 1941-03-10, 82 y.o.   MRN: [id number]  HPI Meghan Oliver is accompanied by Meghan Oliver, R.N. today. She has not been feeling well recently after returning from a stay in [place] with her daughter. She came back fatigued.She has chest CT scheduled tomorrow for follow up on pneumonia requiring hospitalization in late September. Autoimmune work up was negative at that time. She had severe headache associated with that admission and temporal lobe biopsy was negative. Also had asymptomatic elevated SGOT and SGPT. Had elevated troponin but no EKG changes or chest pain. Was felt not to have acute coronary syndrome.has been a bit more depressed lately. Meghan Oliver is her psychiatrist. Hx of memory loss treated with Namenda.had negative Covid test at home.  Tested negative for Influenza today. Respiratory virus panel is negative. B12 level is greater than 2000 and folate is 20.5. iron level improved from September from 13 to 30. Continue iron supplement. Sed rate is 48 improved from 80 in October but a bit higher than  October when it was 42. Hgb  is 10.1 grams- slowly improving.Retic count is 0.9%- not elevated. Maybe bone marrow not responding at her age? Continue iron supplement.   Review of Systems has had some cough recently- getting chest CT tomorrow     Objective:   Physical Exam  Pleasant and cooperative.BP 124/66 pulse 72 T 99.9 degrees pulse ox on room air 99% Skin: Warm and dry.  No cervical adenopathy.  No thyromegaly.  Chest is clear to my exam without rales or wheezing.  Cardiac exam: Regular rate and rhythm without ectopy      Assessment & Plan:   Acute upper respiratory infection-have obtained respiratory virus panel today.  Rapid flu test is negative.  Will be seeing Pulmonary soon for follow-up.  White blood cell count slightly elevated 12,300 and previously was 9800 in January 2024  Abnormal urine dipstick with 2+ LE and culture  not done since it may not have been a good clean-catch specimen and patient is asymptomatic  History of bipolar disorder lithium level is slightly low at 0.5 and this will be sent to Meghan Oliver.  Nortriptyline level is normal  Iron deficiency-continue iron supplement  Hypothyroidism-continue same dose of thyroid replacement in October 2023 TSH was stable at 1.58  Anemia-improving with iron supplement.  Hemoglobin now 11.8 and was 10.1 in early January  Plan: Meghan Oliver card, RN  is doing an excellent job assisting Meghan Oliver and knows she can call me at any time.  Patient's Medicare wellness exam is due after March 25, 2023.

## 2022-10-12 ENCOUNTER — Other Ambulatory Visit (HOSPITAL_BASED_OUTPATIENT_CLINIC_OR_DEPARTMENT_OTHER): Payer: Self-pay

## 2022-10-12 ENCOUNTER — Ambulatory Visit (HOSPITAL_BASED_OUTPATIENT_CLINIC_OR_DEPARTMENT_OTHER)
Admission: RE | Admit: 2022-10-12 | Discharge: 2022-10-12 | Disposition: A | Discharge status: Home / Self Care | Payer: Medicare Other | Source: Ambulatory Visit | Attending: Internal Medicine | Admitting: Internal Medicine

## 2022-10-12 DIAGNOSIS — J849 Interstitial pulmonary disease, unspecified: Secondary | ICD-10-CM

## 2022-10-12 DIAGNOSIS — J984 Other disorders of lung: Secondary | ICD-10-CM | POA: Diagnosis not present

## 2022-10-12 DIAGNOSIS — R918 Other nonspecific abnormal finding of lung field: Secondary | ICD-10-CM | POA: Diagnosis not present

## 2022-10-12 LAB — URINE CULTURE
MICRO NUMBER:: 14401429
Result:: NO GROWTH
SPECIMEN QUALITY:: ADEQUATE

## 2022-10-13 ENCOUNTER — Telehealth: Payer: Self-pay | Admitting: Pulmonary Disease

## 2022-10-13 ENCOUNTER — Encounter (HOSPITAL_BASED_OUTPATIENT_CLINIC_OR_DEPARTMENT_OTHER): Payer: Medicare Other | Admitting: Physical Therapy

## 2022-10-13 ENCOUNTER — Other Ambulatory Visit (HOSPITAL_BASED_OUTPATIENT_CLINIC_OR_DEPARTMENT_OTHER): Payer: Self-pay

## 2022-10-13 DIAGNOSIS — J189 Pneumonia, unspecified organism: Secondary | ICD-10-CM

## 2022-10-13 LAB — RESPIRATORY VIRUS PANEL

## 2022-10-13 MED ORDER — AMOXICILLIN-POT CLAVULANATE 875-125 MG PO TABS
1.0000 | ORAL_TABLET | Freq: Two times a day (BID) | ORAL | 0 refills | Status: DC
  Filled 2022-10-13: qty 14, 7d supply, fill #0

## 2022-10-13 NOTE — Addendum Note (Signed)
Addended by: Elton Sin on: 10/13/2022 04:49 PM   Modules accepted: Orders

## 2022-10-13 NOTE — Telephone Encounter (Signed)
CT chest order placed for 4-6 weeks.  Nothing further at this time.

## 2022-10-13 NOTE — Telephone Encounter (Addendum)
CT scan 1/9 reviewed with bilateral lower lobe consolidations. I called and spoke with Meghan Oliver her contact person who told me that she has been feeling poorly for the past 5 days with fatigue. Denies any cough or sputum production  She has been started on azithromycin 2 days ago by her PCP with no change in symptoms I have ordered augmentin for 7 days and will get CT in 4-6 weeks for follow up

## 2022-10-14 ENCOUNTER — Telehealth: Payer: Self-pay | Admitting: Pulmonary Disease

## 2022-10-14 LAB — COMPLETE METABOLIC PANEL WITH GFR
AG Ratio: 1.2 (calc) (ref 1.0–2.5)
ALT: 97 U/L — ABNORMAL HIGH (ref 6–29)
AST: 37 U/L — ABNORMAL HIGH (ref 10–35)
Albumin: 3.5 g/dL — ABNORMAL LOW (ref 3.6–5.1)
Alkaline phosphatase (APISO): 58 U/L (ref 37–153)
BUN: 23 mg/dL (ref 7–25)
CO2: 20 mmol/L (ref 20–32)
Calcium: 8.5 mg/dL — ABNORMAL LOW (ref 8.6–10.4)
Chloride: 104 mmol/L (ref 98–110)
Creat: 0.76 mg/dL (ref 0.60–0.95)
Globulin: 2.9 g/dL (calc) (ref 1.9–3.7)
Glucose, Bld: 110 mg/dL — ABNORMAL HIGH (ref 65–99)
Potassium: 4.5 mmol/L (ref 3.5–5.3)
Sodium: 134 mmol/L — ABNORMAL LOW (ref 135–146)
Total Bilirubin: 0.3 mg/dL (ref 0.2–1.2)
Total Protein: 6.4 g/dL (ref 6.1–8.1)
eGFR: 78 mL/min/{1.73_m2} (ref >=60)

## 2022-10-14 LAB — IRON,TIBC AND FERRITIN PANEL
%SAT: 9 % (calc) — ABNORMAL LOW (ref 16–45)
Ferritin: 51 ng/mL (ref 16–288)
Iron: 30 ug/dL — ABNORMAL LOW (ref 45–160)
TIBC: 325 mcg/dL (calc) (ref 250–450)

## 2022-10-14 LAB — CBC WITH DIFFERENTIAL/PLATELET
Absolute Monocytes: 745 cells/uL (ref 200–950)
Basophils Absolute: 29 cells/uL (ref 0–200)
Basophils Relative: 0.3 %
Eosinophils Absolute: 88 cells/uL (ref 15–500)
Eosinophils Relative: 0.9 %
HCT: 28.9 % — ABNORMAL LOW (ref 35.0–45.0)
Hemoglobin: 10.1 g/dL — ABNORMAL LOW (ref 11.7–15.5)
Lymphs Abs: 1313 cells/uL (ref 850–3900)
MCH: 30.2 pg (ref 27.0–33.0)
MCHC: 34.9 g/dL (ref 32.0–36.0)
MCV: 86.5 fL (ref 80.0–100.0)
MPV: 10.7 fL (ref 7.5–12.5)
Monocytes Relative: 7.6 %
Neutro Abs: 7624 cells/uL (ref 1500–7800)
Neutrophils Relative %: 77.8 %
Platelets: 207 10*3/uL (ref 140–400)
RBC: 3.34 10*6/uL — ABNORMAL LOW (ref 3.80–5.10)
RDW: 13.1 % (ref 11.0–15.0)
Total Lymphocyte: 13.4 %
WBC: 9.8 10*3/uL (ref 3.8–10.8)

## 2022-10-14 LAB — TEST AUTHORIZATION

## 2022-10-14 LAB — B12 AND FOLATE PANEL
Folate: 20.5 ng/mL
Vitamin B-12: >2000 pg/mL — ABNORMAL HIGH (ref 200–1100)

## 2022-10-14 LAB — RETICULOCYTES
ABS Retic: 29790 cells/uL (ref 20000–80000)
Retic Ct Pct: 0.9 %

## 2022-10-14 LAB — SEDIMENTATION RATE: Sed Rate: 48 mm/h — ABNORMAL HIGH (ref 0–30)

## 2022-10-14 NOTE — Telephone Encounter (Signed)
Spoke with Dr. Velora Heckler who states that he believes his nurse was told by Dr. Vaughan Browner that pt needs to be seen because pt has bilateral pneumonia and very frail. RN reviewed noted entered by Dr. Vaughan Browner on 10/13/22 which stated additional 7 days of Augmentin and follow CT in 4 - 6 weeks. Dr. Velora Heckler was also informed that there is no OV available in office this week. Dr. Velora Heckler asked message to be sent to Dr. Vaughan Browner asking advise. Dr. Vaughan Browner please advise.

## 2022-10-14 NOTE — Telephone Encounter (Signed)
Per Dr. Matilde Bash request  pt and husband will come into office tomorrow on 10/15/22 to review CT scan. Nothing further needed at this time.   Routing to Dr. Vaughan Browner as Juluis Rainier

## 2022-10-14 NOTE — Telephone Encounter (Signed)
PT Husband calling stating his wife just had a CT and Dr.Mannam wants to see her w/the next 2 days. I have no appts avail for him. Pls. Call Dr. Velora Heckler to advise @ [phone]

## 2022-10-15 ENCOUNTER — Encounter: Payer: Self-pay | Admitting: Pulmonary Disease

## 2022-10-15 ENCOUNTER — Ambulatory Visit (INDEPENDENT_AMBULATORY_CARE_PROVIDER_SITE_OTHER): Payer: Medicare Other | Admitting: Pulmonary Disease

## 2022-10-15 ENCOUNTER — Ambulatory Visit: Payer: Medicare Other | Admitting: Pulmonary Disease

## 2022-10-15 VITALS — BP 116/62 | HR 62 | Temp 98.0°F | Ht <= 58 in | Wt 95.2 lb

## 2022-10-15 DIAGNOSIS — T17908A Unspecified foreign body in respiratory tract, part unspecified causing other injury, initial encounter: Secondary | ICD-10-CM

## 2022-10-15 DIAGNOSIS — J189 Pneumonia, unspecified organism: Secondary | ICD-10-CM | POA: Diagnosis not present

## 2022-10-15 NOTE — Progress Notes (Addendum)
Meghan Oliver    [id number]    Jul 12, 1941  Primary Care Physician:Baxley, Cresenciano Lick, MD  Referring Physician: [name], MD [address] [place],  [place] [address]  Chief complaint: Follow-up after hospitalization for pneumonia  HPI: 82 y.o. who  has a past medical history of Anxiety, Arthritis, Bipolar II disorder ([place]), CHF (congestive heart failure) ([place]), Chronic bronchitis ([place]), Chronic lower back pain, Chronic right hip pain, CKD (chronic kidney disease), stage II, Coronary artery disease, Esophagitis, erosive, GAD (generalized anxiety disorder), GERD (gastroesophageal reflux disease), Headache, Heart murmur, systolic, History of adenomatous polyp of colon, History of blood transfusion (12/2017), History of electroconvulsive therapy, History of hiatal hernia, Hyperlipidemia, Hypertension, Hypothyroidism, Internal carotid artery stenosis, bilateral, Major depression, chronic, Memory loss, Migraines, OSA (obstructive sleep apnea), Osteoporosis, Pneumonia (07/29/2022), Poor historian, Presence of permanent cardiac pacemaker (03/17/2018), Pulmonary nodule, S/P placement of cardiac pacemaker 03/17/18 [employer]  (03/18/2018), Short-term memory loss, and Sick sinus syndrome ([place]).   She was hospitalized on 07/01/2022 with temporal headaches, dry cough and chills with chest x-ray showing right lung infiltrate.  She was treated with azithromycin and ceftriaxone and steroids.  Due to concern for elevated sed rate a question of temporal arteritis was entertained and vascular surgery consulted for temporal artery biopsy.  PCCM consulted on 10/3 for worsening lung infiltrates and to evaluate possible vasculitis related interstitial lung disease.  On review of her CT scan the findings are felt to be more consistent with pneumonia.  Her CTD serologies were negative as was the temporal artery biopsy.  She was placed on steroids which was weaned off in a few days Underwent right-sided  thoracentesis which showed parapneumonic effusion.  Post discharge on 07/08/2022 she continues to improve.  She subsequently had a follow-up with Dr. Estanislado Pandy who felt this no evidence of rheumatologic condition or temporal arteritis.  Pets: No pets Occupation: Retired Exposures: No known exposures Smoking history: 30-pack-year smoker Travel history: No significant travel history Relevant family history: No family history of lung disease  Interim History: She had a CT scan earlier this week which showed bibasilar consolidations suspicious for pneumonia.  Being treated with azithromycin and Augmentin.  Had symptoms of fatigue, dyspnea but is better today.    Outpatient Encounter Medications as of 10/15/2022  Medication Sig   amoxicillin (AMOXIL) 500 MG capsule Take 3 capsules (1,500 mg total) by mouth 1 hour prior to dental work.   amoxicillin-clavulanate (AUGMENTIN) 875-125 MG tablet Take 1 tablet by mouth 2 (two) times daily.   aspirin 81 MG chewable tablet Chew 1 tablet (81 mg total) by mouth daily.   azithromycin (ZITHROMAX) 250 MG tablet Take 2 tablets on day 1, then 1 tablet daily on days 2 through 5   Calcium Carbonate-Vitamin D (CALCIUM 600/VITAMIN D PO) Take 1 tablet by mouth 2 (two) times daily.   cetirizine (ZYRTEC) 10 MG tablet Take 10 mg by mouth daily as needed for allergies.   Cholecalciferol (VITAMIN D3) 50 MCG (2000 UT) TABS Take 2,000 Units by mouth in the morning.   dexlansoprazole (DEXILANT) 60 MG capsule Take 1 capsule by mouth daily (Patient taking differently: Take 60 mg by mouth daily.)   fesoterodine (TOVIAZ) 8 MG TB24 tablet Take 1 tablet (8 mg total) by mouth daily.   glycerin adult 2 g suppository Use 1 suppository as needed if you have not had a bowel movement in  4 to 5 days   Krill Oil 500 MG CAPS  Take 500 mg by mouth daily.   lactulose (CHRONULAC) 10 GM/15ML solution Take 30 mLs (20 g total) by mouth 3 (three) times daily between meals as needed for mild  constipation. (Patient taking differently: Take 20 g by mouth daily as needed for mild constipation.)   Lavender Oil 80 MG CAPS Take 160 mg by mouth at bedtime.   levothyroxine (SYNTHROID) 75 MCG tablet Take 1 tablet (75 mcg total) by mouth daily.   linaclotide (LINZESS) 72 MCG capsule Take 2 capsules (144 mcg total) by mouth daily before breakfast.   lithium carbonate 300 MG capsule Take 1 capsule (300 mg total) by mouth at bedtime.   LORazepam (ATIVAN) 0.5 MG tablet TAKE 1/2 TO 1 TABLET BY MOUTH EVERY NIGHT AT BEDTIME AND 1 TABLET EVERY 8 HOURS AS NEEDED FOR ANXIETY (Patient taking differently: Take 0.25-0.5 mg by mouth See admin instructions. TAKE 1/2 TO 1 TABLET BY MOUTH EVERY NIGHT AT BEDTIME AND 1 TABLET EVERY 8 HOURS AS NEEDED FOR ANXIETY)   magic mouthwash w/lidocaine SOLN Take 5 mLs by mouth 3 (three) times daily as needed for mouth pain.   memantine (NAMENDA) 10 MG tablet Take 1 tablet (10 mg total) by mouth 2 (two) times daily.   mirtazapine (REMERON) 15 MG tablet Take 1 tablet (15 mg total) by mouth at bedtime.   Multiple Vitamins-Minerals (MULTIVITAMIN WITH MINERALS) tablet Take 1 tablet by mouth daily.   Multiple Vitamins-Minerals (PRESERVISION AREDS 2 PO) Take 1 capsule by mouth in the morning and at bedtime.   naloxegol oxalate (MOVANTIK) 12.5 MG TABS tablet Take 2 tablets (25 mg total) by mouth daily. (Patient taking differently: Take 25 mg by mouth daily. PRN)   nortriptyline (PAMELOR) 75 MG capsule Take 1 capsule (75 mg total) by mouth at bedtime.   ondansetron (ZOFRAN-ODT) 4 MG disintegrating tablet Place 1 tablet every 6 hours by translingual route as needed.   PARoxetine (PAXIL) 20 MG tablet TAKE 1 TABLET(20 MG) BY MOUTH AT BEDTIME   polyethylene glycol (MIRALAX / GLYCOLAX) 17 g packet Take 17 g by mouth daily as needed for mild constipation.   primidone (MYSOLINE) 50 MG tablet TAKE 2 TABLETS BY MOUTH EVERY MORNING AND 1 TABLET EVERY EVENING   Pyridoxine HCl (VITAMIN B-6) 500  MG tablet Take 500 mg by mouth 2 (two) times daily.   sennosides-docusate sodium (SENOKOT-S) 8.6-50 MG tablet Take 2 tablets by mouth as needed.   valACYclovir (VALTREX) 1000 MG tablet Take 2 tablets by mouth at onset, then 2 tablets 12 hours later. (Patient taking differently: Take 2,000 mg by mouth every 12 (twelve) hours. PRn)   vitamin B-12 (CYANOCOBALAMIN) 1000 MCG tablet Take 1,000 mcg by mouth at bedtime.   COVID-19 mRNA vaccine 2023-2024 (COMIRNATY) syringe Inject into the muscle.   influenza vaccine adjuvanted (FLUAD QUADRIVALENT) 0.5 ML injection Inject into the muscle.   pneumococcal 20-valent conjugate vaccine (PREVNAR 20) 0.5 ML injection Inject into the muscle.   RSV vaccine recomb adjuvanted (AREXVY) 120 MCG/0.5ML injection Inject into the muscle.   No facility-administered encounter medications on file as of 10/15/2022.    Physical Exam: Blood pressure 116/62, pulse 62, temperature 98 F (36.7 C), temperature source Oral, height '4\' 10"'$  (1.473 m), weight 95 lb 3.2 oz (43.2 kg), SpO2 96 %. Gen:      No acute distress HEENT:  EOMI, sclera anicteric Neck:     No masses; no thyromegaly Lungs:    Clear to auscultation bilaterally; normal respiratory effort CV:  Regular rate and rhythm; no murmurs Abd:      + bowel sounds; soft, non-tender; no palpable masses, no distension Ext:    No edema; adequate peripheral perfusion Skin:      Warm and dry; no rash Neuro: alert and oriented x 3 Psych: normal mood and affect   Data Reviewed: Imaging: High-resolution CT 07/05/2022-moderate effusion left greater than right, patchy airspace consolidation most severe in the right upper and lower lobes.  Chest x-ray 07/20/2022-improving right lung opacities.  CT high-resolution 10/13/2022-new bilateral lower lobe consolidation with trace effusion.  Small to moderate pericardial effusion I have reviewed the images personally.  PFTs: 08/12/2011 FEV1 1.92 [105%], F/F 79 Normal  spirometry  Labs: CTD serologies 08/15/2022-positive only for rheumatoid factor of 18.2  Assessment:  Recurrent community-acquired pneumonia Hospitalized in September 2023 for community-acquired pneumonia with improvement in imaging post discharge.  Now with CT scan again showing bilateral lower lobe consolidation She is improving with azithromycin and Augmentin and will complete a 7-day course of antibiotics Follow-up CT in 4 to 6 weeks to ensure that the infiltrates are clearing up  Concern for aspiration as she has hiatal hernia, GERD Continues on PPI and has GI follow-up.  Will discuss with Dr. Silverio Decamp if further GI workup is needed Order modified barium swallow for further evaluation  Plan/Recommendations: Continue Z-Pak, Augmentin CT scan in 4 to 6 weeks Modified barium swallow  Marshell Garfinkel MD [facility] 10/15/2022, 10:34 AM  CC: [name], MD  Addendum: On further review of chart.  She has had modified barium swallow in September of this year during hospitalization and also esophagram in the past.  So we will cancel the repeat MBS and follow aspiration precautions  MBS 07/02/2022- largely functional oropharyngeal swallow but she does appear to have a prominent CP that might partially interfere with bolus flow but leaving only trace residue within the UES or just above CP   Esophagram 07/18/2017-mild esophageal dysmotility, presbyesophagus, delayed passage of barium tablet in distal esophagus.

## 2022-10-15 NOTE — Patient Instructions (Signed)
Will make referral to speech pathology for modified barium swallow to evaluate aspiration Continue antibiotics as prescribed Follow-up CT in 4 to 6 weeks Return to clinic in 6 weeks after CT scan

## 2022-10-18 ENCOUNTER — Encounter (HOSPITAL_BASED_OUTPATIENT_CLINIC_OR_DEPARTMENT_OTHER): Payer: Medicare Other | Admitting: Physical Therapy

## 2022-10-18 ENCOUNTER — Telehealth: Payer: Self-pay

## 2022-10-18 NOTE — Telephone Encounter (Signed)
Spoke with Meghan Oliver to inform of lab results,  Meghan Oliver verbalized understanding and stated that patient has been constipated so she is not taking any iron supplements and patient's husband does not want her on any iron infusion right now.  They will monitor patient and decide which route will be best for her.  Lab results was mailed to patient's home.

## 2022-10-19 ENCOUNTER — Other Ambulatory Visit (HOSPITAL_COMMUNITY): Payer: Self-pay

## 2022-10-19 ENCOUNTER — Telehealth: Payer: Self-pay | Admitting: Internal Medicine

## 2022-10-19 ENCOUNTER — Telehealth: Payer: Self-pay | Admitting: Psychiatry

## 2022-10-19 DIAGNOSIS — R1310 Dysphagia, unspecified: Secondary | ICD-10-CM

## 2022-10-19 DIAGNOSIS — R059 Cough, unspecified: Secondary | ICD-10-CM

## 2022-10-19 NOTE — Telephone Encounter (Signed)
RTC  Meghan Oliver reports not doing well.  Went to [facility].  Recent pneumonia.  They are thinking this is aspiration pneurmonia. Lethargic and anxious with tremor. Ruminating.  Hot to cold .  Low tolerance.  Going downhill quickly.  She's verbalizing depression and desire to isolate.  Won't go out.  Started within the last 2 weeks. Usually doesn't complain when she's sick.   [name]'s biggest concern is that Inocente Salles is having trouble handling this bc she's up at night rambling and not really safe to be up at night.    Will see her 10/22/22 Get lithium and nortriptyline levels.  Lynder Parents, MD, DFAPA

## 2022-10-19 NOTE — Telephone Encounter (Signed)
scheduled

## 2022-10-19 NOTE — Telephone Encounter (Signed)
Spoke to [place] and she stated pt has verbalized that she is more depressed.She has a decreased appetite,restlessness,and has withdrawn from everything.She does not want to engage with others and is laying around all day.They think she needs to see you sooner then 2/21

## 2022-10-19 NOTE — Telephone Encounter (Signed)
[name] nurse that cares for [facility] called. Pt continues to become depressed and all the behavior that come with it. RTC [phone]. Asking for possible apt to see CC again.

## 2022-10-19 NOTE — Telephone Encounter (Signed)
Randol Kern, RN [phone]  [name] called to say that Meghan Oliver's depression is worsening and her Pneumonia is improving and she would like to bring Meghan Oliver is to repeat the below labs to check her metabolic levels to see where she is, and to also get some labs for Dr Clovis Pu whom she is seeing on Friday. [name] did see pulmonary last week.  CBC with Diff BMP Sed Rate Lithium Level Nortriptyline Level

## 2022-10-20 ENCOUNTER — Other Ambulatory Visit (HOSPITAL_BASED_OUTPATIENT_CLINIC_OR_DEPARTMENT_OTHER): Payer: Self-pay

## 2022-10-20 ENCOUNTER — Encounter (HOSPITAL_BASED_OUTPATIENT_CLINIC_OR_DEPARTMENT_OTHER): Payer: Medicare Other | Admitting: Physical Therapy

## 2022-10-21 ENCOUNTER — Other Ambulatory Visit: Payer: Medicare Other

## 2022-10-21 ENCOUNTER — Other Ambulatory Visit: Payer: Self-pay

## 2022-10-21 DIAGNOSIS — F3181 Bipolar II disorder: Secondary | ICD-10-CM | POA: Diagnosis not present

## 2022-10-21 DIAGNOSIS — F32A Depression, unspecified: Secondary | ICD-10-CM | POA: Diagnosis not present

## 2022-10-22 ENCOUNTER — Ambulatory Visit (INDEPENDENT_AMBULATORY_CARE_PROVIDER_SITE_OTHER): Payer: Medicare Other | Admitting: Psychiatry

## 2022-10-22 DIAGNOSIS — F3181 Bipolar II disorder: Secondary | ICD-10-CM | POA: Diagnosis not present

## 2022-10-22 NOTE — Progress Notes (Signed)
Meghan Oliver [id number] [birth year] 82 y.o.  Subjective:   Patient ID:  Meghan Oliver is a 82 y.o. (DOB 1940/10/09) female. Patient was last seen August 21, 2018 Chief Complaint:  Chief Complaint  Patient presents with   Follow-up   Depression   Fatigue   Memory Loss     Meghan Oliver presents to the office today for follow-up of Severe TR bipolar depression with psychotic features including catatonia.  11/30/2018 was the last visit and the following was noted: Had ECT yesterday at [facility].  Last was Jan 8 and was doing well then.  Next scheduled April 15.  Had a lithium level from there which is pending. Pretty good until the last 10 days to 2 weeks with less energy and motivation.  Bothered by old sick dog.  Worried about how that will affect her.  Has slept with the dog.  Enjoyed [place].  Going to gym and playing [place].  Took the class and didn't feel she caught on quickly. Wonders if it is bc of the ECT memory effects.  Bridge is harder and not playing.  Planning to return to [facility] mid April.   spent the winter in [place] per usual..  No significant depression since here. ECT frequency about 6 weeks.  Gets very anxious the day of the ECT.  ECT consistently for a year.  Best in mood in 7-8 years.  Also the pacemaker helped.  Friends notice the benefit.  Plan:  Option increase in the nortriptyline if needed.  Considered this.  They agree and would like to increase nortriptyline to 75 to try to reduce tendency to depression before the ECT.   03/19/19  Addendum: Here the contents from an email from the nurse for Meghan Oliver  Hello Dr. Clovis Oliver,   I hope you and your family are healthy and well. I took Meghan Oliver to [facility] for ECT and here are labs from 01/26/19. I am going to also forward them to [facility] for follow up with her LFT's. They are still higher than I would like.  Her lithium level was 0.51 on a daily dose of '300mg'$  qohs x 4 nights- alternating with '450mg'$  qohs x 3 nights. She is not  demonstrating any signs of elevated levels, minimal hand tremors, less anxiety and restlessness since her ECT treatment.  Her Nortryptyline level was 140 on '75mg'$  qhs.  I would like to continue her current dosages of both the above meds and recheck her lithium level at her next scheduled ECT of 03/23/19.  Please let me know of any changes you would like to make. Take care, Meghan Oliver Lithium level was 0.4 on the '450mg'$  alt with 300 mg QOD. No changes were made in meds.  11/15/2019 phone call: Telephone call from patient's husband Meghan Oliver on 11/14/2019  Patient is scheduled for ECT soon for maintenance ECT.  He thinks it is been 2-1/2 months since the last ECT but he is going to check the date for sure.  It is still be done being done at [facility].  He is wondering about skipping this treatment because the patient has continued to be free of depression and seems cognitively clearer as these ECT treatments have been spread out further from each other.  Patient remains on medications as prescribed.  If it is truly been over 2 months since the last ECT then it is reasonable to consider discontinuing ECT.  It is unlikely that ECT with the frequency of greater  than 2 months is significantly helpful at preventing her relapse.  If however the ECT frequency is less than every 2 months it could still be helping to prevent recurrence.  He indicated he would consider this information and discuss it with her ECT team and make a decision.  They are heading to [place] in the next few weeks.  Needs to schedule an appointment with me for follow-up because it is been many months.  He agrees.   02/19/2020 appt, the following noted: Moving to [place] in a few months.  Ready to downsize.   Stopped ECT as discussed and has not been more deprressed.   Walks dog 4 times daily.   Lithium 0.7 on  01/30/20 on lithium 300 mg plus 150 mg on M, W, F Nortriptyline 70 on 75 mg HS. No SE except tremor.  Balance is not  great.  Very happy.   No depression since here.  No mood swings.  Sleep and appetite is OK.  No med changes:  04/03/2020 TC with the following noted: Patient's husband called stating that her tremor was a little worse and she was having some stutter. No evidence of stroke otherwise. First thing would be to check serum lithium level and BMP.  Order written.  Have asked her husband to let us know about the lab to which it should be sent.  04/22/20 TC witht he following noted: Lithium level is not dangerously high but it is has crept up to 1.0 which is higher than desired for her.  Our goal for her is 0.5-0.7.  At the current level it is likely causing side effects so we should reduce the dosage from lithium 300 mg plus 150 mg on M, W, F  TO 300 mg daily.  Meaning drop off the 150 mg capsule.  It will take about 2 weeks for the level to gradually come down to the desired level.  05/08/2020 appt with the following noted: Seen with H and Meghan Oliver nurse. Moving to [facility] and feels OK about it.  I think I'll be fine.   Pretty good with balance.  Doing yoga and it helps. Meghan Oliver has cancer. Old dog 35 & 1/2 yo still living. Meghan Oliver's concerns when went to [place] for D's BD she had difficulty time with tremor lethargy, more confusion.  Better when go back home. Meghan Oliver notes using more lorazepam. At times gait is unsteady. Not significantly depressed.  Can be anxious at times.  Eating okay.  Sleeping okay.  Is easily confused under stress. Plan: reduce lithium to 300 mg nightly.  07/14/20 appt with the following noted: Seen with her husband as well as her family nurse. Feeling fine and pleased with that.   Some anxiety over the move to smaller place. Meghan Oliver's twin also having heart problems Gene. [name]'s kids came and visited.    Plan: Continue nortriptyline 75 mg, paroxetine 20 mg, lithium 300 mg, Namenda 10 mg twice daily, lorazepam 0.5 mg 1/2-1 3 times daily as needed, mirtazapine 7.5 mg  nightly  02/27/2021 appointment with the following noted:  Seen with H and nurse [place] email from nurse Meghan Oliver on 02/24/2021 indicated that he had moved into [facility] in May.  Ashana has gradually become more depressed and more anxious using lorazepam as prescribed 3 times daily.  More memory issues.  She has remained engaged and initiating appropriate conversation.  She is very worried about her 65 year old dog who is in poor health and fears she  will become more depressed when the dog dies.  She and her husband do not wish to prefer to pursue further ECT unless absolutely necessary. Likes [facility] so far.   No depression by her report.  Sold big house in Feb.  It worked out well.   Worries about her dog 37 yo will die soon. H agrees she's done well with depression.   Enjoys things and has interests. Plan: Continue Lithium 300 mg daily  Continue nortriptyline 75 mg daily Continue paroxetine 20 mg daily Continue lorazepam 0.5 mg 3 times daily for both anxiety and catatonia Continue vitamin B6 and primidone for tremor Cerefolin NAC 2 daily.  05/13/2021 appointment with the following noted: Happy with [place].  H and she agree is doing well with depression.Kermit Balo socialization. She complains of memory.   Family visited and getting along with Baker Janus better.  H says Baker Janus is acting better.  Goes to dinner and activities together now.  Better relationship with Gene.. Tremor has been OK.   Ativan usually 0.25 mg TID and tolerated. Anxiety managed with this generally. Molly dog health more stable. To [place] before T'giving until April.   Plan: Continue Lithium 300 mg daily  Continue nortriptyline 75 mg daily Continue paroxetine 20 mg daily Continue lorazepam 0.5 mg 3 times daily for both anxiety and catatonia Continue vitamin B6 and primidone for tremor   08/14/2021 appointment with the following noted: Seen with her husband Meghan Oliver and nurse Meghan Oliver. Everyone reports that  her mood has been stable.  Her anxiety is manageable with as needed lorazepam.  She is still happy with the transition to [facility].  They are preparing to go to [place] for the winter per usual but may sell their house father there. Tolerating meds without any unusual side effects. Is dealing with grief over the loss of her dog Molly.  03/10/2022 appt noted:  seen with H and nurse Vicie Mutters [facility] house. Meghan Oliver not good phsyically hard to play golf.  Been really hard. He'll be 85 in July.  Some stress scheduling with D's.   66 th wedding necessary this month.   Mood up and down.  Sister in law across the street can be difficult and demeaning.  She feels unsettled.  Some worry. Some back pain limiting activity.   Sleep is ok except with pain awakening.   No SE, except tremor some worse about 3-5 and helped by lorazepam 0.25 mg then No greater than 1 mg lorazepam daily Patient denies any recent difficulty with anxiety except as noted.  Patient denies difficulty with sleep initiation or maintenance. Denies appetite disturbance.    Patient has some difficulty with concentration.  Patient denies any suicidal ideation.  Usually takes Ativan at night and sleeps well 8 hours.   Plan no med changes  10/23/213 appt noted: Still doing well. Pneumonia and hosp for 6 days 3 weeks ago.  Viral.  Improving at this time. Increased tremor gradually in 3-4 mos helped by lorazepam avg 1.00-1.25 mg daily. No change in primidone. Total R hip August 2023 and done well.  Finishing home PT.   Appetite poor for 7-8 mos.  2 protein drinks daily but may not eat more than 1 good meal a day. [facility]. Substantial memory problems in hospital and in unusual enviornments. Nurse notices decline.  B with LBD passed in August. No pain meds or muscle relaxants beyond pain. No depression.  H note sometimes she has less motivation than others. Meghan Oliver notes some normal waxing  and waning of mood. Good social interaction.   Not  spending winter in [facility] this year.  D still lives there. Plan: Increase mirtazapine 15 mg HS for appetite.  Also used for sleep and depression.  09/23/22 appt noted:  with nurse and Meghan Oliver Meghan Oliver feeling bad with more pain and fatigue.  Meghan Oliver has CHF.   Made [place] sad.  Lost a cgood friends.  Rough time.  Mostly worry about Meghan Oliver.  She feels afraid to be alone if something happens.   Not sig depressed.    Just worry . H can't be as active.  Taking a shower is hard.   She's back exercising again.   Went to [employer] for Thanksgiving in [facility] and plan to go back for Christmas. It's hard to travel. Told nurse she was depressed. Appetite is better and once asleep is OK. Using more lorazepam for anxiety 3-4 daily. Plan: Continue Lithium 300 mg daily  Continue nortriptyline 75 mg daily Continue paroxetine 20 mg daily Continue lorazepam 0.5 mg 3 times daily for both anxiety and catatonia Continue vitamin B6 and primidone for tremor Increased and it helped mirtazapine 15 mg HS for appetite.  Also used for sleep and depression.  10/19/22  RTC  Meghan Oliver reports not doing well.  Went to [facility].  Recent pneumonia.  They are thinking this is aspiration pneurmonia. Lethargic and anxious with tremor. Ruminating.  Hot to cold .  Low tolerance.  Going downhill quickly.  She's verbalizing depression and desire to isolate.  Won't go out.  Started within the last 2 weeks. Usually doesn't complain when she's sick.   Meghan Oliver's biggest concern is that Inocente Salles is having trouble handling this bc she's up at night rambling and not really safe to be up at night.    Will see her 10/22/22 Get lithium and nortriptyline levels.   Lynder Parents, MD, [facility]     10/22/22 emergency appt with nurse Meghan Oliver and H Meghan Oliver: Pneumonia Oct and then again 2nd week January.  It appears to have largely resoved.  It's really bad.  Nausea. Anxious bc Meghan Oliver ill.  Doesn't want him to leave.  Meghan Oliver sees anxiety triggering anxiety with little to no appetite.  Hypersensitive to  stimuli like light and noises.  Doesn't want TV on.  Meghan Oliver says totally reversed in last 6 weeks.    Meghan Oliver reports she's overemotional.  Friends died. Losing wt 91.4# today. More trouble with sleep. Lorazepam increased to 2 mg daily.  Not sleepy with it.  Doesn't seem to be helpful. Last week neglect hygiene but better this week a little. Meghan Oliver says STM is poor at times and at times disoriented if in [place].  Will walk in bathroom and wonder where sh is. Barium swallow pending for evaluation of refulex.     No current alcohol problems  Saw Dr. Carles Collet last week and no change in neuro meds.  Past Psychiatric Medication Trials: Prozac with lithium and Zyprexa 2.5 to 5 mg,  nortriptyline, paroxetine, ,  pramipexole, mirtazapine,  Rexulti, Abilify, Vraylar, Latuda 10 mg,  lamotrigine.   Namenda,  lorazepam,   this loss list is not exhaustive  Review of Systems:  Review of Systems  Constitutional:  Positive for fatigue.  Respiratory:  Negative for shortness of breath.   Cardiovascular:  Negative for palpitations.  Gastrointestinal:  Positive for constipation and nausea.       Linzess and Mozantic managed   Musculoskeletal:  Positive for gait problem.  Neurological:  Negative for dizziness, tremors  and weakness.       Some balance issues  Psychiatric/Behavioral:  Positive for decreased concentration and dysphoric mood. Negative for agitation, behavioral problems, confusion, hallucinations, self-injury, sleep disturbance and suicidal ideas. The patient is nervous/anxious. The patient is not hyperactive.     Medications: I have reviewed the patient's current medications.  Current Outpatient Medications  Medication Sig Dispense Refill   amoxicillin (AMOXIL) 500 MG capsule Take 3 capsules (1,500 mg total) by mouth 1 hour prior to dental work. 9 capsule 1   amoxicillin-clavulanate (AUGMENTIN) 875-125 MG tablet Take 1 tablet by mouth 2 (two) times daily. 14 tablet 0   aspirin 81 MG chewable tablet  Chew 1 tablet (81 mg total) by mouth daily.     Calcium Carbonate-Vitamin D (CALCIUM 600/VITAMIN D PO) Take 1 tablet by mouth 2 (two) times daily.     cetirizine (ZYRTEC) 10 MG tablet Take 10 mg by mouth daily as needed for allergies.     Cholecalciferol (VITAMIN D3) 50 MCG (2000 UT) TABS Take 2,000 Units by mouth in the morning.     COVID-19 mRNA vaccine 2023-2024 (COMIRNATY) syringe Inject into the muscle. 0.3 mL 0   dexlansoprazole (DEXILANT) 60 MG capsule Take 1 capsule by mouth daily (Patient taking differently: Take 60 mg by mouth daily.) 90 capsule 3   fesoterodine (TOVIAZ) 8 MG TB24 tablet Take 1 tablet (8 mg total) by mouth daily. 90 tablet 3   glycerin adult 2 g suppository Use 1 suppository as needed if you have not had a bowel movement in  4 to 5 days 25 suppository 0   influenza vaccine adjuvanted (FLUAD QUADRIVALENT) 0.5 ML injection Inject into the muscle. 0.5 mL 0   Krill Oil 500 MG CAPS Take 500 mg by mouth daily.     lactulose (CHRONULAC) 10 GM/15ML solution Take 30 mLs (20 g total) by mouth 3 (three) times daily between meals as needed for mild constipation. (Patient taking differently: Take 20 g by mouth daily as needed for mild constipation.) 473 mL 3   Lavender Oil 80 MG CAPS Take 160 mg by mouth at bedtime.     levothyroxine (SYNTHROID) 75 MCG tablet Take 1 tablet (75 mcg total) by mouth daily. 90 tablet 2   linaclotide (LINZESS) 72 MCG capsule Take 2 capsules (144 mcg total) by mouth daily before breakfast. 180 capsule 1   lithium carbonate 300 MG capsule Take 1 capsule (300 mg total) by mouth at bedtime. 90 capsule 0   LORazepam (ATIVAN) 0.5 MG tablet TAKE 1/2 TO 1 TABLET BY MOUTH EVERY NIGHT AT BEDTIME AND 1 TABLET EVERY 8 HOURS AS NEEDED FOR ANXIETY (Patient taking differently: Take 0.5 mg by mouth See admin instructions. TAKE 1/2 TO 1 TABLET BY MOUTH EVERY NIGHT AT BEDTIME AND 1 TABLET EVERY 8 HOURS AS NEEDED FOR ANXIETY) 180 tablet 1   magic mouthwash w/lidocaine SOLN  Take 5 mLs by mouth 3 (three) times daily as needed for mouth pain. 120 mL 0   memantine (NAMENDA) 10 MG tablet Take 1 tablet (10 mg total) by mouth 2 (two) times daily. 180 tablet 1   mirtazapine (REMERON) 15 MG tablet Take 1 tablet (15 mg total) by mouth at bedtime. 90 tablet 1   Multiple Vitamins-Minerals (MULTIVITAMIN WITH MINERALS) tablet Take 1 tablet by mouth daily.     Multiple Vitamins-Minerals (PRESERVISION AREDS 2 PO) Take 1 capsule by mouth in the morning and at bedtime.     naloxegol oxalate (MOVANTIK) 12.5 MG TABS tablet  Take 2 tablets (25 mg total) by mouth daily. (Patient taking differently: Take 25 mg by mouth daily. PRN) 60 tablet 3   ondansetron (ZOFRAN-ODT) 4 MG disintegrating tablet Place 1 tablet every 6 hours by translingual route as needed. 20 tablet 0   PARoxetine (PAXIL) 20 MG tablet TAKE 1 TABLET(20 MG) BY MOUTH AT BEDTIME 90 tablet 0   pneumococcal 20-valent conjugate vaccine (PREVNAR 20) 0.5 ML injection Inject into the muscle. 0.5 mL 0   polyethylene glycol (MIRALAX / GLYCOLAX) 17 g packet Take 17 g by mouth daily as needed for mild constipation.     primidone (MYSOLINE) 50 MG tablet TAKE 2 TABLETS BY MOUTH EVERY MORNING AND 1 TABLET EVERY EVENING 270 tablet 0   Pyridoxine HCl (VITAMIN B-6) 500 MG tablet Take 500 mg by mouth 2 (two) times daily.     RSV vaccine recomb adjuvanted (AREXVY) 120 MCG/0.5ML injection Inject into the muscle. 0.5 mL 0   sennosides-docusate sodium (SENOKOT-S) 8.6-50 MG tablet Take 2 tablets by mouth as needed.     valACYclovir (VALTREX) 1000 MG tablet Take 2 tablets by mouth at onset, then 2 tablets 12 hours later. (Patient taking differently: Take 2,000 mg by mouth every 12 (twelve) hours. PRn) 30 tablet 1   vitamin B-12 (CYANOCOBALAMIN) 1000 MCG tablet Take 1,000 mcg by mouth at bedtime.     nortriptyline (PAMELOR) 25 MG capsule Take 2 capsules (50 mg total) by mouth at bedtime. 60 capsule 1   No current facility-administered medications for  this visit.    Medication Side Effects: Other: tremor  stable.  No worse.  Allergies:  Allergies  Allergen Reactions   Propranolol Other (See Comments)    Low blood pressure. Bradycardia    Brexpiprazole Other (See Comments)    Aphasia and catatonia    Past Medical History:  Diagnosis Date   Anxiety    Arthritis    "hips, spine" (03/17/2018)   Bipolar II disorder (HCC)    CHF (congestive heart failure) (HCC)    Chronic bronchitis (HCC)    Chronic lower back pain    Chronic right hip pain    CKD (chronic kidney disease), stage II    Coronary artery disease    stent x1   Esophagitis, erosive    GAD (generalized anxiety disorder)    GERD (gastroesophageal reflux disease)    Headache    "maybe monthly" (03/17/2018))   Heart murmur, systolic    History of adenomatous polyp of colon    08-04-2016  tubular adenoma   History of blood transfusion 12/2017   "related to vascular hematoma"   History of electroconvulsive therapy    at [facility]--  started 04-15-2015 to 11-17-2016  total greater than 40 times   History of hiatal hernia    Hyperlipidemia    Hypertension    Hypothyroidism    Internal carotid artery stenosis, bilateral    per last duplex 05-01-2014  bilateral ICA 40-59%   Major depression, chronic    ECT treatments extensive and multiple started 07/ 2016   Memory loss    "both short and long-term; needs frequent reminders to follow instrucitons" (05/16/2017)   Migraines    "none in years" (03/17/2018)   OSA (obstructive sleep apnea)    per study 06/ 2012 moderate OSA  ; "refuses to wear masks" (03/17/2018)   Osteoporosis    Pneumonia 07/29/2022   Poor historian    due to short term memory loss   Presence of permanent cardiac pacemaker 03/17/2018  Pulmonary nodule    monitored by pcp   S/P placement of cardiac pacemaker 03/17/18 [facility]  03/18/2018   Short-term memory loss    Sick sinus syndrome (HCC)     Family History  Problem Relation Age of Onset   Heart  attack Father 21       deceased   Hypertension Father    Heart disease Father    Dementia Brother    Parkinson's disease Brother    Heart disease Brother    Breast cancer Paternal Aunt        Age 58's   Breast cancer Paternal Grandmother        Age unknown   Colon cancer Neg Hx     Social History   Socioeconomic History   Marital status: Married    Spouse name: Dr. Lyla Son   Number of children: 2   Years of education: Not on file   Highest education level: Not on file  Occupational History   Occupation: housewife    Employer: UNEMPLOYED  Tobacco Use   Smoking status: Former    Packs/day: 2.00    Years: 15.00    Total pack years: 30.00    Types: Cigarettes    Quit date: 01/13/1971    Years since quitting: 51.8    Passive exposure: Never   Smokeless tobacco: Never  Vaping Use   Vaping Use: Never used  Substance and Sexual Activity   Alcohol use: Yes    Comment: occassional 1 x a week   Drug use: Never   Sexual activity: Not Currently    Comment: intercourse age 56, sexual partners less than 5  Other Topics Concern   Not on file  Social History Narrative   Right handed   Social Determinants of Health   Financial Resource Strain: Not on file  Food Insecurity: No Food Insecurity (07/06/2022)   Hunger Vital Sign    Worried About Running Out of Food in the Last Year: Never true    Ran Out of Food in the Last Year: Never true  Transportation Needs: No Transportation Needs (07/06/2022)   PRAPARE - Hydrologist (Medical): No    Lack of Transportation (Non-Medical): No  Physical Activity: Not on file  Stress: Not on file  Social Connections: Not on file  Intimate Partner Violence: Not At Risk (07/06/2022)   Humiliation, Afraid, Rape, and Kick questionnaire    Fear of Current or Ex-Partner: No    Emotionally Abused: No    Physically Abused: No    Sexually Abused: No    Past Medical History, Surgical history, Social history, and  Family history were reviewed and updated as appropriate.   Please see review of systems for further details on the patient's review from today.   Objective:   Physical Exam:  LMP  (LMP Unknown)   Physical Exam Constitutional:      General: She is not in acute distress.    Appearance: She is well-developed.  Musculoskeletal:        General: No deformity.  Neurological:     Mental Status: She is alert and oriented to person, place, and time.     Motor: No tremor.     Coordination: Coordination abnormal.     Comments: Cane use  Psychiatric:        Attention and Perception: She is attentive.        Mood and Affect: Mood is anxious and depressed. Affect is not labile,  blunt, angry or inappropriate.        Speech: Speech is not slurred.        Behavior: Behavior normal. Behavior is not slowed or withdrawn.        Thought Content: Thought content normal. Thought content is not delusional. Thought content does not include homicidal or suicidal ideation. Thought content does not include suicidal plan.        Cognition and Memory: She exhibits impaired recent memory.        Judgment: Judgment normal.     Comments: Insight fair to good. No auditory or visual hallucinations.  Depression and anxiety are worse lately Gait ok. Memory appears stable to maybe a little worse.     Lab Review:     Component Value Date/Time   NA 137 10/21/2022 1116   NA 136 04/20/2017 1151   K 5.0 10/21/2022 1116   CL 106 10/21/2022 1116   CO2 23 10/21/2022 1116   GLUCOSE 82 10/21/2022 1116   BUN 20 10/21/2022 1116   BUN 24 04/20/2017 1151   CREATININE 0.79 10/21/2022 1116   CALCIUM 9.6 10/21/2022 1116   PROT 6.4 10/11/2022 1252   PROT 6.2 04/20/2017 1151   ALBUMIN 3.9 09/02/2022 1221   ALBUMIN 3.9 04/20/2017 1151   AST 37 (H) 10/11/2022 1252   ALT 97 (H) 10/11/2022 1252   ALKPHOS 64 09/02/2022 1221   BILITOT 0.3 10/11/2022 1252   BILITOT <0.2 04/20/2017 1151   GFRNONAA >60 07/08/2022 0429    GFRNONAA 50 (L) 07/10/2020 1021   GFRAA 58 (L) 07/10/2020 1021       Component Value Date/Time   WBC 12.3 (H) 10/21/2022 1116   RBC 3.93 10/21/2022 1116   HGB 11.8 10/21/2022 1116   HGB 10.2 (L) 04/20/2017 1151   HCT 35.7 10/21/2022 1116   HCT 29.6 (L) 04/20/2017 1151   PLT 494 (H) 10/21/2022 1116   PLT 385 (H) 04/20/2017 1151   MCV 90.8 10/21/2022 1116   MCV 89 04/20/2017 1151   MCH 30.0 10/21/2022 1116   MCHC 33.1 10/21/2022 1116   RDW 13.5 10/21/2022 1116   RDW 13.9 04/20/2017 1151   LYMPHSABS 1,132 10/21/2022 1116   LYMPHSABS 1.5 04/20/2017 1151   MONOABS 0.7 07/07/2022 0458   EOSABS 74 10/21/2022 1116   EOSABS 0.3 04/20/2017 1151   BASOSABS 25 10/21/2022 1116   BASOSABS 0.0 04/20/2017 1151    Lithium Lvl  Date Value Ref Range Status  10/21/2022 0.5 (L) 0.6 - 1.2 mmol/L Final  10/21/2022 lithium level 0.5 on 300 mg nightly is stable.,  Nortriptyline level pending  08/02/22 nortriptyline 85, lihtium 0.5  Nortriptyline level 51 on 50 mg nightly with paroxetine 20 mg daily  07/10/2020 lithium level 0.6 at [facility] office and nortriptyline level 130 on the current dosages of lithium 300 mg nightly and nortriptyline 75 mg nightly  02/20/2021 labs lithium 0.6 on 300 mg nightly.  Nortriptyline level 49 which is lower than expected on 75 mg nightly along with paroxetine 20 mg daily.    No results found for: "PHENYTOIN", "PHENOBARB", "VALPROATE", "CBMZ"   .res Assessment: Plan:    Bipolar II disorder ([place]) - Plan: nortriptyline (PAMELOR) 25 MG capsule    History of lithium toxicity repeatedly.  Greater than 50% of 50 min face to face time with patient her nurse and her husband was spent on counseling and coordination of care.  Her case has been highly complex and treatment resistant at times.  Her severe treatment  resistant bipolar depression is currently in remission.  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  She had required longterm ECT.  A previous episode of depression approximately 10 years ago stayed in remission on Paxil and nortriptyline and lithium for a period of years until the lithium level was decreased due to tremor.  Now we are maintaining an adequate lithium level.  She has had several episodes of lithium toxicity with hospitalization.    From records it appears her last ECT was in September 2020.    Depression and anxiety much worse in the last several weeks.  Memory seems to be stable if not better.     Disc pros and cons of return to ECT.  She is strongly opposed at this time.   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  She is continuing under the care of her neurologist Dr. Carles Collet.  The nortriptyline was increased to 75 to try to reduce tendency to depression in February 2020.  Need repeat level. 08/02/22 nortriptyline 85, lihtium 0.5  Check labs every 4-6 mos.   Disc the frequency of lithium labs. Need repeat level bc last was right after hospitalization with pneumonia. Repeat lithium level and nortriptyline level bc relapse depression. Received 10/26/22= 115.  Will reduce nortriptyline to 50 mg HS to protect from SE including jitteriness and reduced appetite and cognitive dysfunction and increase mirtazapine instead for mood, anxiety, appetite, and sleep  Lithium level goals are between 0.5 and 0.7.  She has a history of lithium toxicity when she was taking chlorthalidone with the lithium.   Discussed signs and symptoms of lithium toxicity.  Discussed the risk of increasing depression if the lithium level gets too low.  She cannot tolerate higher doses of lithium.  Memory is mildly and gradually worse noted by nurse.  Overall memory appears to be stable and she has  reasonable recall for recent family events.  There remains some concern about drug interactions between paroxetine and nortriptyline and given her age we do not want the nortriptyline levels higher than necessary .  However the nortriptyline level was not high. Disc risk polypharmacy  Continue Lithium 300 mg daily  Continue nortriptyline 75 mg daily Continue paroxetine 20 mg daily Continue lorazepam 0.5 mg 3 times daily for both anxiety and catatonia Continue vitamin B6 and primidone for tremor Increase  mirtazapine 30 mg HS for appetite,  used for sleep and depression. Repeat lithium level and nortriptyline level  Jan 2024 0.5, pending nortriptyline  Supportive therapy dealing with H's declining health which is a major contributor to her mood problems.  This appointment was 50 minutes.   Lynder Parents MD, DFAPA Please see After Visit Summary for patient specific instructions.  Future Appointments  Date Time Provider [place]  11/01/2022  9:30 AM Carney Living, PT [facility]  11/03/2022  9:30 AM Carney Living, PT [facility]  11/03/2022  1:00 PM [facility] SPEECH THERAPIST [facility] [place]  11/03/2022  1:00 PM [facility] R/F 1 [facility] [place]  11/08/2022  9:30 AM Carney Living, PT [facility]  11/10/2022  9:30 AM Carney Living, PT [facility]  11/12/2022 11:40 AM Larey Dresser, MD [facility] None  11/15/2022  9:30 AM Kayleen Memos,  [place], PT [facility]  11/17/2022  9:30 AM Carney Living, PT [facility]  11/22/2022  9:30 AM Carney Living, PT [facility]  11/24/2022  9:30 AM Carney Living, PT [facility]  11/24/2022 10:30 AM Cottle, [place] Co., MD [facility] None  11/29/2022  9:30 AM Carney Living, PT [facility]  11/29/2022  2:00 PM [facility] 1 [facility]  12/01/2022  9:30 AM Carney Living, PT [facility]  12/03/2022 11:00 AM Mauri Pole, MD [facility]  01/03/2023  3:20 PM [facility] DEVICE REMOTES [facility]  04/04/2023  3:20 PM [facility] DEVICE  REMOTES [facility]  07/04/2023  3:20 PM [facility] DEVICE REMOTES [facility]  10/03/2023  7:00 AM [facility] DEVICE REMOTES [facility]      No orders of the defined types were placed in this encounter.      -------------------------------

## 2022-10-24 LAB — CBC WITH DIFFERENTIAL/PLATELET
Absolute Monocytes: 517 cells/uL (ref 200–950)
Basophils Absolute: 25 cells/uL (ref 0–200)
Basophils Relative: 0.2 %
Eosinophils Absolute: 74 cells/uL (ref 15–500)
Eosinophils Relative: 0.6 %
HCT: 35.7 % (ref 35.0–45.0)
Hemoglobin: 11.8 g/dL (ref 11.7–15.5)
Lymphs Abs: 1132 cells/uL (ref 850–3900)
MCH: 30 pg (ref 27.0–33.0)
MCHC: 33.1 g/dL (ref 32.0–36.0)
MCV: 90.8 fL (ref 80.0–100.0)
MPV: 9.4 fL (ref 7.5–12.5)
Monocytes Relative: 4.2 %
Neutro Abs: 10553 cells/uL — ABNORMAL HIGH (ref 1500–7800)
Neutrophils Relative %: 85.8 %
Platelets: 494 10*3/uL — ABNORMAL HIGH (ref 140–400)
RBC: 3.93 10*6/uL (ref 3.80–5.10)
RDW: 13.5 % (ref 11.0–15.0)
Total Lymphocyte: 9.2 %
WBC: 12.3 10*3/uL — ABNORMAL HIGH (ref 3.8–10.8)

## 2022-10-24 LAB — BASIC METABOLIC PANEL
BUN: 20 mg/dL (ref 7–25)
CO2: 23 mmol/L (ref 20–32)
Calcium: 9.6 mg/dL (ref 8.6–10.4)
Chloride: 106 mmol/L (ref 98–110)
Creat: 0.79 mg/dL (ref 0.60–0.95)
Glucose, Bld: 82 mg/dL (ref 65–99)
Potassium: 5 mmol/L (ref 3.5–5.3)
Sodium: 137 mmol/L (ref 135–146)

## 2022-10-24 LAB — LITHIUM LEVEL: Lithium Lvl: 0.5 mmol/L — ABNORMAL LOW (ref 0.6–1.2)

## 2022-10-24 LAB — SEDIMENTATION RATE: Sed Rate: 28 mm/h (ref 0–30)

## 2022-10-24 LAB — NORTRIPTYLINE LEVEL: Nortriptyline Lvl: 110 mcg/L (ref 50–150)

## 2022-10-25 ENCOUNTER — Ambulatory Visit: Payer: Medicare Other | Admitting: Neurology

## 2022-10-25 ENCOUNTER — Encounter (HOSPITAL_BASED_OUTPATIENT_CLINIC_OR_DEPARTMENT_OTHER): Payer: Medicare Other | Admitting: Physical Therapy

## 2022-10-26 ENCOUNTER — Other Ambulatory Visit (HOSPITAL_BASED_OUTPATIENT_CLINIC_OR_DEPARTMENT_OTHER): Payer: Self-pay

## 2022-10-26 ENCOUNTER — Telehealth: Payer: Self-pay | Admitting: Psychiatry

## 2022-10-26 ENCOUNTER — Encounter: Payer: Self-pay | Admitting: Psychiatry

## 2022-10-26 MED ORDER — NORTRIPTYLINE HCL 25 MG PO CAPS
50.0000 mg | ORAL_CAPSULE | Freq: Every day | ORAL | 1 refills | Status: DC
  Filled 2022-10-26: qty 60, 30d supply, fill #0
  Filled 2022-11-16: qty 60, 30d supply, fill #1

## 2022-10-26 NOTE — Telephone Encounter (Signed)
[name] called reporting continuing as have been and worsening to some degree. Advise what to do with meds.  Have meeting 4-5:30. RTC after 6pm @ [phone]

## 2022-10-26 NOTE — Telephone Encounter (Signed)
Joans nurse ,kim is requesting a call from you after 6 pm today it is urgent

## 2022-10-26 NOTE — Telephone Encounter (Signed)
RTC  No improvement since Friday.  Gradually less engaged.  Can be overstimulated with visitors.   Disc nortriptyline level 115 is a little higher than it was and higher levels can cause SE in elderly.   Reduce nortriptyline to 50 mg HS. Taking lorazepam   Consider low dose of Vraylar or pramipexole.  Or Spravato.    Lynder Parents, MD, DFAPA

## 2022-10-27 ENCOUNTER — Other Ambulatory Visit (HOSPITAL_BASED_OUTPATIENT_CLINIC_OR_DEPARTMENT_OTHER): Payer: Self-pay

## 2022-10-27 ENCOUNTER — Encounter (HOSPITAL_BASED_OUTPATIENT_CLINIC_OR_DEPARTMENT_OTHER): Payer: Medicare Other | Admitting: Physical Therapy

## 2022-10-29 ENCOUNTER — Other Ambulatory Visit: Payer: Self-pay

## 2022-10-29 MED ORDER — SPRAVATO (84 MG DOSE) 28 MG/DEVICE NA SOPK: 84.0000 mg | PACK | NASAL | 5 refills | Status: DC

## 2022-10-29 MED ORDER — SPRAVATO (56 MG DOSE) 28 MG/DEVICE NA SOPK: 56.0000 mg | PACK | NASAL | 1 refills | Status: DC

## 2022-11-01 ENCOUNTER — Ambulatory Visit (HOSPITAL_BASED_OUTPATIENT_CLINIC_OR_DEPARTMENT_OTHER): Payer: Medicare Other | Admitting: Physical Therapy

## 2022-11-01 ENCOUNTER — Telehealth: Payer: Self-pay | Admitting: Psychiatry

## 2022-11-01 NOTE — Telephone Encounter (Signed)
I called [name] to confirm some information about Spravato. She said that Dr. Velora Heckler would like to speak with [name] to address some questions before starting Spravato. Please call to address.

## 2022-11-01 NOTE — Patient Instructions (Addendum)
It was a pleasure to see Meghan Oliver today.  Respiratory virus panel and rapid flu test are negative.  She will be seeing pulmonologist later this week.  I have not started antibiotic therapy for her respiratory infection.  Iron deficiency seems to be improving.  TSH was not checked with this visit but will continue same dose of thyroid replacement.  Drug levels for lithium and nortriptyline sent to [name].  She is due for Medicare wellness visit after March 25, 2023.

## 2022-11-03 ENCOUNTER — Encounter (HOSPITAL_COMMUNITY): Payer: Medicare Other

## 2022-11-03 ENCOUNTER — Ambulatory Visit (HOSPITAL_BASED_OUTPATIENT_CLINIC_OR_DEPARTMENT_OTHER): Payer: Medicare Other | Admitting: Physical Therapy

## 2022-11-03 ENCOUNTER — Ambulatory Visit (HOSPITAL_COMMUNITY): Payer: Medicare Other

## 2022-11-04 ENCOUNTER — Other Ambulatory Visit (HOSPITAL_COMMUNITY): Payer: Self-pay

## 2022-11-04 ENCOUNTER — Other Ambulatory Visit (HOSPITAL_BASED_OUTPATIENT_CLINIC_OR_DEPARTMENT_OTHER): Payer: Self-pay

## 2022-11-04 MED ORDER — METOPROLOL TARTRATE 25 MG PO TABS
25.0000 mg | ORAL_TABLET | ORAL | 3 refills | Status: DC | PRN
  Filled 2022-11-04 – 2022-11-11 (×2): qty 45, 90d supply, fill #0
  Filled 2023-07-19: qty 45, 90d supply, fill #1
  Filled 2023-10-16: qty 45, 90d supply, fill #2

## 2022-11-06 ENCOUNTER — Other Ambulatory Visit (HOSPITAL_BASED_OUTPATIENT_CLINIC_OR_DEPARTMENT_OTHER): Payer: Self-pay

## 2022-11-08 ENCOUNTER — Telehealth: Payer: Self-pay | Admitting: Internal Medicine

## 2022-11-08 ENCOUNTER — Encounter (HOSPITAL_BASED_OUTPATIENT_CLINIC_OR_DEPARTMENT_OTHER): Payer: Medicare Other | Admitting: Physical Therapy

## 2022-11-08 NOTE — Telephone Encounter (Signed)
[place]  [name] called to say that Meghan Oliver has slipped into a deep depression and she would like for you to call her or Dr Velora Heckler to talk about maybe Salah possibly having diabetes insipiatious from medications. [name] is staying with them all the time now.

## 2022-11-08 NOTE — Telephone Encounter (Signed)
Called and left VM that it is okay to bring Meghan Oliver for labs that we do them on Tuesday, Thursday and Friday

## 2022-11-08 NOTE — Telephone Encounter (Signed)
Called Meghan Oliver back and she said what they are wanting is to do a repeat labs of BMP, CBC to make sure she her electrolytes are good and she is not dehydrated because she is due to start a ketamine treatment, and since she is extremely confused they can not get any information from [place], Like last night she was hoarding water bottles in her bed. Dr Velora Heckler is very worried.

## 2022-11-09 NOTE — Telephone Encounter (Signed)
[name] called back and will bring Meghan Oliver in on Thursday for these labs

## 2022-11-10 ENCOUNTER — Encounter (HOSPITAL_BASED_OUTPATIENT_CLINIC_OR_DEPARTMENT_OTHER): Payer: Medicare Other | Admitting: Physical Therapy

## 2022-11-11 ENCOUNTER — Other Ambulatory Visit: Payer: Medicare Other

## 2022-11-11 ENCOUNTER — Other Ambulatory Visit (HOSPITAL_BASED_OUTPATIENT_CLINIC_OR_DEPARTMENT_OTHER): Payer: Self-pay

## 2022-11-11 ENCOUNTER — Ambulatory Visit: Payer: Medicare Other

## 2022-11-11 ENCOUNTER — Encounter: Payer: Medicare Other | Admitting: Psychiatry

## 2022-11-11 VITALS — BP 172/82 | HR 64

## 2022-11-11 DIAGNOSIS — E878 Other disorders of electrolyte and fluid balance, not elsewhere classified: Secondary | ICD-10-CM

## 2022-11-11 DIAGNOSIS — Z01812 Encounter for preprocedural laboratory examination: Secondary | ICD-10-CM | POA: Diagnosis not present

## 2022-11-11 DIAGNOSIS — F061 Catatonic disorder due to known physiological condition: Secondary | ICD-10-CM

## 2022-11-12 ENCOUNTER — Encounter (HOSPITAL_COMMUNITY): Payer: Medicare Other | Admitting: Cardiology

## 2022-11-12 ENCOUNTER — Other Ambulatory Visit: Payer: Self-pay | Admitting: Psychiatry

## 2022-11-12 ENCOUNTER — Other Ambulatory Visit: Payer: Self-pay

## 2022-11-12 DIAGNOSIS — R3589 Other polyuria: Secondary | ICD-10-CM

## 2022-11-12 LAB — COMPLETE METABOLIC PANEL WITH GFR
AG Ratio: 1.2 (calc) (ref 1.0–2.5)
ALT: 86 U/L — ABNORMAL HIGH (ref 6–29)
AST: 44 U/L — ABNORMAL HIGH (ref 10–35)
Albumin: 3.8 g/dL (ref 3.6–5.1)
Alkaline phosphatase (APISO): 64 U/L (ref 37–153)
BUN: 24 mg/dL (ref 7–25)
CO2: 23 mmol/L (ref 20–32)
Calcium: 9.4 mg/dL (ref 8.6–10.4)
Chloride: 106 mmol/L (ref 98–110)
Creat: 0.73 mg/dL (ref 0.60–0.95)
Globulin: 3.1 g/dL (calc) (ref 1.9–3.7)
Glucose, Bld: 97 mg/dL (ref 65–99)
Potassium: 4.6 mmol/L (ref 3.5–5.3)
Sodium: 137 mmol/L (ref 135–146)
Total Bilirubin: 0.2 mg/dL (ref 0.2–1.2)
Total Protein: 6.9 g/dL (ref 6.1–8.1)
eGFR: 82 mL/min/{1.73_m2} (ref >=60)

## 2022-11-12 LAB — CBC WITH DIFFERENTIAL/PLATELET
Absolute Monocytes: 548 cells/uL (ref 200–950)
Basophils Absolute: 53 cells/uL (ref 0–200)
Basophils Relative: 0.8 %
Eosinophils Absolute: 112 cells/uL (ref 15–500)
Eosinophils Relative: 1.7 %
HCT: 36.3 % (ref 35.0–45.0)
Hemoglobin: 12.1 g/dL (ref 11.7–15.5)
Lymphs Abs: 1439 cells/uL (ref 850–3900)
MCH: 31.2 pg (ref 27.0–33.0)
MCHC: 33.3 g/dL (ref 32.0–36.0)
MCV: 93.6 fL (ref 80.0–100.0)
MPV: 10.3 fL (ref 7.5–12.5)
Monocytes Relative: 8.3 %
Neutro Abs: 4448 cells/uL (ref 1500–7800)
Neutrophils Relative %: 67.4 %
Platelets: 215 10*3/uL (ref 140–400)
RBC: 3.88 10*6/uL (ref 3.80–5.10)
RDW: 13.7 % (ref 11.0–15.0)
Total Lymphocyte: 21.8 %
WBC: 6.6 10*3/uL (ref 3.8–10.8)

## 2022-11-12 MED ORDER — SPRAVATO (56 MG DOSE) 28 MG/DEVICE NA SOPK: 56.0000 mg | PACK | NASAL | 1 refills | Status: DC

## 2022-11-15 ENCOUNTER — Other Ambulatory Visit: Payer: Self-pay | Admitting: Internal Medicine

## 2022-11-15 ENCOUNTER — Other Ambulatory Visit (HOSPITAL_BASED_OUTPATIENT_CLINIC_OR_DEPARTMENT_OTHER): Payer: Self-pay

## 2022-11-15 ENCOUNTER — Ambulatory Visit: Payer: Medicare Other

## 2022-11-15 ENCOUNTER — Other Ambulatory Visit: Payer: Self-pay | Admitting: Psychiatry

## 2022-11-15 ENCOUNTER — Ambulatory Visit (INDEPENDENT_AMBULATORY_CARE_PROVIDER_SITE_OTHER): Payer: Self-pay | Admitting: Psychiatry

## 2022-11-15 ENCOUNTER — Ambulatory Visit (HOSPITAL_COMMUNITY): Payer: Medicare Other

## 2022-11-15 ENCOUNTER — Encounter: Payer: Self-pay | Admitting: Psychiatry

## 2022-11-15 ENCOUNTER — Encounter (HOSPITAL_BASED_OUTPATIENT_CLINIC_OR_DEPARTMENT_OTHER): Payer: Medicare Other | Admitting: Physical Therapy

## 2022-11-15 ENCOUNTER — Ambulatory Visit (INDEPENDENT_AMBULATORY_CARE_PROVIDER_SITE_OTHER): Payer: Medicare Other | Admitting: Psychiatry

## 2022-11-15 VITALS — BP 173/76 | HR 61

## 2022-11-15 DIAGNOSIS — R3589 Other polyuria: Secondary | ICD-10-CM

## 2022-11-15 DIAGNOSIS — F411 Generalized anxiety disorder: Secondary | ICD-10-CM

## 2022-11-15 DIAGNOSIS — G251 Drug-induced tremor: Secondary | ICD-10-CM

## 2022-11-15 DIAGNOSIS — F3181 Bipolar II disorder: Secondary | ICD-10-CM

## 2022-11-15 DIAGNOSIS — Z79899 Other long term (current) drug therapy: Secondary | ICD-10-CM

## 2022-11-15 DIAGNOSIS — G3184 Mild cognitive impairment, so stated: Secondary | ICD-10-CM

## 2022-11-15 DIAGNOSIS — F061 Catatonic disorder due to known physiological condition: Secondary | ICD-10-CM

## 2022-11-15 DIAGNOSIS — A499 Bacterial infection, unspecified: Secondary | ICD-10-CM

## 2022-11-15 DIAGNOSIS — F314 Bipolar disorder, current episode depressed, severe, without psychotic features: Secondary | ICD-10-CM

## 2022-11-15 MED ORDER — PAROXETINE HCL 20 MG PO TABS
20.0000 mg | ORAL_TABLET | Freq: Every day | ORAL | 0 refills | Status: DC
  Filled 2022-11-15: qty 90, 90d supply, fill #0

## 2022-11-15 MED ORDER — LEVOTHYROXINE SODIUM 75 MCG PO TABS
75.0000 ug | ORAL_TABLET | Freq: Every day | ORAL | 2 refills | Status: DC
  Filled 2022-11-15: qty 90, 90d supply, fill #0
  Filled 2022-11-16 – 2023-03-22 (×4): qty 90, 90d supply, fill #1

## 2022-11-15 NOTE — Progress Notes (Signed)
[facility] Initial Adult Exam  Name: Meghan Oliver Date: 11/15/2022 MRN: [id number] DOB: 1941-07-05 PCP: [name], MD  Time spent: 60 minutes  Guardian/Payee:  patient    Paperwork requested:  No   Reason for Visit /Presenting Problem:  anxiety, some nausea with her anxiety, depressed, mood fluctuations and has been diagnosed and treated for depression, Bipolar I, Bipolar II, and anxiety for several years, prior hx of ECT in 2019 then stopped by patient/family, verbalizes today that she is anxious and depressed. Worries often and about the future. Low tolerance. Past 2 wks depression and anxiety worse. Difficulty in providing answers to some questions as part of this evaluation today but worked to get as much accurate information as possible.  Seemed to be more connected and responding as we got further into the session.  Patient was referred for therapy due to her anxiety and depression, by her psychiatrist.  Mental Status Exam:    Appearance:   Neat     Behavior:  Appropriate and Sharing  Motor:  slowed  Speech/Language:   Slow  Affect:  Depressed and anxious  Mood:  anxious and depressed  Thought process:  goal directed and slowed some  Thought content:    Rumination and some obsessiveness  Sensory/Perceptual disturbances:    WNL  Orientation:  oriented to person, place, time/date, situation, day of week, month of year, year, and stated date of Feb.   Attention:  Fair  Concentration:  Fair  Memory:  Fair short term memory is worse than long term  Fund of knowledge:   Fair and Poor  Insight:    Fair  Judgment:   Fair  Impulse Control:  Poor   Reported Symptoms:  See symptoms noted above  Risk Assessment: Danger to Self:  No Self-injurious Behavior: No Danger to Others: No Duty to Warn:no Physical Aggression / Violence:No  Access to Firearms a concern: No  Gang Involvement:No  Patient / guardian was educated about steps to take if suicide or homicide risk  level increases between visits: Denies any SI. While future psychiatric events cannot be accurately predicted, the patient does not currently require acute inpatient psychiatric care and does not currently meet [facility] involuntary commitment criteria.  Substance Abuse History: Current substance abuse: No     Past Psychiatric History:   Previous psychological history is significant for bipolar, anxiety, depression Outpatient Providers:Dr. Lynder Parents History of Psych Hospitalization: Yes  Psychological Testing:  n/a    Abuse History: Victim of No.,  N/A    Report needed: No. Victim of Neglect:No. Perpetrator of  n/a   Witness / Exposure to Domestic Violence: No   Protective Services Involvement: No  Witness to [employer] Violence:  No   Family History:  Confirmed Family History  Problem Relation Age of Onset   Heart attack Father 30       deceased   Hypertension Father    Heart disease Father    Dementia Brother    Parkinson's disease Brother    Heart disease Brother    Breast cancer Paternal Aunt        Age 19's   Breast cancer Paternal Grandmother        Age unknown   Colon cancer Neg Hx     Living situation: the patient lives with their spouse  Sexual Orientation:  Straight  Relationship Status: married 1 yrs Name of spouse / other:Meghan Oliver  If a parent, number of children / ages:2 adult children and they are close with adult kids.   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     Strengths:  Self Advocate  Barriers:  over worrying    Legal History: Pending legal issue / charges: The patient has no significant history of legal issues. History of legal issue / charges:  n/a  Medical History/Surgical History: Confirmed and generally good physical health for her age; "had pacemaker installed" Past Medical History:  Diagnosis Date   Anxiety    Arthritis    "hips, spine" (03/17/2018)   Bipolar II disorder (HCC)    CHF (congestive heart failure) (HCC)    Chronic bronchitis (HCC)    Chronic lower back pain    Chronic right hip pain    CKD (chronic kidney disease), stage II    Coronary artery disease    stent x1   Esophagitis, erosive    GAD (generalized anxiety disorder)    GERD (gastroesophageal reflux disease)    Headache    "maybe monthly" (03/17/2018))   Heart murmur, systolic    History of adenomatous polyp of colon    08-04-2016  tubular adenoma   History of blood transfusion 12/2017   "related to vascular hematoma"   History of electroconvulsive therapy    at [facility]--  started 04-15-2015 to 11-17-2016  total greater than 40 times   History of hiatal hernia    Hyperlipidemia    Hypertension    Hypothyroidism    Internal carotid artery stenosis, bilateral    per last duplex 05-01-2014  bilateral ICA 40-59%   Major depression, chronic    ECT treatments extensive and multiple started 07/ 2016   Memory loss    "both short and long-term; needs frequent reminders to follow instrucitons" (05/16/2017)   Migraines    "none in years" (03/17/2018)   OSA (obstructive sleep apnea)    per study 06/ 2012 moderate OSA  ; "refuses to wear masks" (03/17/2018)   Osteoporosis    Pneumonia 07/29/2022   Poor historian    due to short term memory loss   Presence of permanent cardiac pacemaker 03/17/2018   Pulmonary nodule    monitored by pcp   S/P placement of cardiac pacemaker 03/17/18 [facility]  03/18/2018   Short-term memory loss    Sick sinus syndrome [facility])     Past Surgical History:  Procedure Laterality Date    APPENDECTOMY  1971   ARTERY BIOPSY Left 07/05/2022   Procedure: BIOPSY TEMPORAL ARTERY;  Surgeon: Broadus John, MD;  Location: [facility] OR;  Service: Vascular;  Laterality: Left;   AUGMENTATION MAMMAPLASTY Bilateral    CARDIOVASCULAR STRESS TEST  01-30-2015  dr Aundra Dubin   Low risk nuclear study w/ no evidence ischemia or infarction/  normal LV funciton and wall motion , 76%   COLONOSCOPY W/ BIOPSIES AND POLYPECTOMY  "multiple"   COLONOSCOPY WITH ESOPHAGOGASTRODUODENOSCOPY (EGD)  last one 08-04-2016   CORONARY ANGIOPLASTY WITH STENT PLACEMENT  05/16/2017   "LAD"   CORONARY STENT INTERVENTION N/A 05/16/2017   Procedure: CORONARY STENT INTERVENTION;  Surgeon: Burnell Blanks, MD;  Location: [place]  CV LAB;  Service: Cardiovascular;  Laterality: N/A;   ESOPHAGOGASTRODUODENOSCOPY  02-26-04   ESOPHAGOGASTRODUODENOSCOPY  "multiple"   INSERT / REPLACE / REMOVE PACEMAKER  03/17/2018   IR THORACENTESIS ASP PLEURAL SPACE W/IMG GUIDE  07/07/2022   LEFT HEART CATH AND CORONARY ANGIOGRAPHY N/A 05/16/2017   Procedure: LEFT HEART CATH AND CORONARY ANGIOGRAPHY;  Surgeon: Larey Dresser, MD;  Location: [place] CV LAB;  Service: Cardiovascular;  Laterality: N/A;   OVARIAN CYST SURGERY  1970s   Laparotomy    PACEMAKER IMPLANT N/A 03/17/2018   Procedure: PACEMAKER IMPLANT;  Surgeon: Evans Lance, MD;  Location: [place] CV LAB;  Service: Cardiovascular;  Laterality: N/A;   PORT-A-CATH PLACEMENT  05/31/2016; 2018   "@ [facility]; for ECT series"; "@ [facility] also"   PORT-A-CATH REMOVAL N/A 03/11/2017   Procedure: REMOVAL PORT-A-CATH;  Surgeon: Jackolyn Confer, MD;  Location: [facility];  Service: General;  Laterality: N/A;   PORTA CATH REMOVAL  03/17/2018   PORTA CATH REMOVAL  03/17/2018   Procedure: PORTA CATH REMOVAL;  Surgeon: Evans Lance, MD;  Location: [place] CV LAB;  Service: Cardiovascular;;   TOTAL HIP ARTHROPLASTY Right 05/18/2022   Procedure: TOTAL HIP ARTHROPLASTY  ANTERIOR APPROACH;  Surgeon: Paralee Cancel, MD;  Location: [facility] ORS;  Service: Orthopedics;  Laterality: Right;   TRANSTHORACIC ECHOCARDIOGRAM  09/05/2013  dr Aundra Dubin   mild LVH, ef [id number], grade 1 diastolic dysfunction/  very mild AV stenosis with mild AR/  trivial MR and PT/ mild to moderate LAE/ mild TR/ mild pulmonary hypertension with PA peak pressure 65mHg   TUBAL LIGATION      Medications: Current Outpatient Medications  Medication Sig Dispense Refill   metoprolol tartrate (LOPRESSOR) 25 MG tablet Take 1 tablet (25 mg total) by mouth as needed. 45 tablet 3   amoxicillin (AMOXIL) 500 MG capsule Take 3 capsules (1,500 mg total) by mouth 1 hour prior to dental work. 9 capsule 1   amoxicillin-clavulanate (AUGMENTIN) 875-125 MG tablet Take 1 tablet by mouth 2 (two) times daily. 14 tablet 0   aspirin 81 MG chewable tablet Chew 1 tablet (81 mg total) by mouth daily.     Calcium Carbonate-Vitamin D (CALCIUM 600/VITAMIN D PO) Take 1 tablet by mouth 2 (two) times daily.     cetirizine (ZYRTEC) 10 MG tablet Take 10 mg by mouth daily as needed for allergies.     Cholecalciferol (VITAMIN D3) 50 MCG (2000 UT) TABS Take 2,000 Units by mouth in the morning.     COVID-19 mRNA vaccine 2023-2024 (COMIRNATY) syringe Inject into the muscle. 0.3 mL 0   dexlansoprazole (DEXILANT) 60 MG capsule Take 1 capsule by mouth daily (Patient taking differently: Take 60 mg by mouth daily.) 90 capsule 3   Esketamine HCl, 56 MG Dose, (SPRAVATO, 56 MG DOSE,) 28 MG/DEVICE SOPK Place 56 mg into the nose every 3 (three) days. 2 each 1   Esketamine HCl, 84 MG Dose, (SPRAVATO, 84 MG DOSE,) 28 MG/DEVICE SOPK Place 84 mg into the nose every 3 (three) days. 3 each 5   fesoterodine (TOVIAZ) 8 MG TB24 tablet Take 1 tablet (8 mg total) by mouth daily. 90 tablet 3   glycerin adult 2 g suppository Use 1 suppository as needed if you have not had a bowel movement in  4 to 5 days 25 suppository 0   influenza vaccine adjuvanted (FLUAD  QUADRIVALENT) 0.5 ML injection Inject into the muscle. 0.5 mL 0   Krill Oil 500 MG CAPS  Take 500 mg by mouth daily.     lactulose (CHRONULAC) 10 GM/15ML solution Take 30 mLs (20 g total) by mouth 3 (three) times daily between meals as needed for mild constipation. (Patient taking differently: Take 20 g by mouth daily as needed for mild constipation.) 473 mL 3   Lavender Oil 80 MG CAPS Take 160 mg by mouth at bedtime.     levothyroxine (SYNTHROID) 75 MCG tablet Take 1 tablet (75 mcg total) by mouth daily. 90 tablet 2   levothyroxine (SYNTHROID) 75 MCG tablet Take 1 tablet (75 mcg total) by mouth daily. 90 tablet 2   linaclotide (LINZESS) 72 MCG capsule Take 2 capsules (144 mcg total) by mouth daily before breakfast. 180 capsule 1   lithium carbonate 300 MG capsule Take 1 capsule (300 mg total) by mouth at bedtime. 90 capsule 0   LORazepam (ATIVAN) 0.5 MG tablet TAKE 1/2 TO 1 TABLET BY MOUTH EVERY NIGHT AT BEDTIME AND 1 TABLET EVERY 8 HOURS AS NEEDED FOR ANXIETY (Patient taking differently: Take 0.5 mg by mouth See admin instructions. TAKE 1/2 TO 1 TABLET BY MOUTH EVERY NIGHT AT BEDTIME AND 1 TABLET EVERY 8 HOURS AS NEEDED FOR ANXIETY) 180 tablet 1   magic mouthwash w/lidocaine SOLN Take 5 mLs by mouth 3 (three) times daily as needed for mouth pain. 120 mL 0   memantine (NAMENDA) 10 MG tablet Take 1 tablet (10 mg total) by mouth 2 (two) times daily. 180 tablet 1   mirtazapine (REMERON) 15 MG tablet Take 1 tablet (15 mg total) by mouth at bedtime. 90 tablet 1   Multiple Vitamins-Minerals (MULTIVITAMIN WITH MINERALS) tablet Take 1 tablet by mouth daily.     Multiple Vitamins-Minerals (PRESERVISION AREDS 2 PO) Take 1 capsule by mouth in the morning and at bedtime.     naloxegol oxalate (MOVANTIK) 12.5 MG TABS tablet Take 2 tablets (25 mg total) by mouth daily. (Patient taking differently: Take 25 mg by mouth daily. PRN) 60 tablet 3   nortriptyline (PAMELOR) 25 MG capsule Take 2 capsules (50 mg total) by  mouth at bedtime. 60 capsule 1   ondansetron (ZOFRAN-ODT) 4 MG disintegrating tablet Place 1 tablet every 6 hours by translingual route as needed. 20 tablet 0   PARoxetine (PAXIL) 20 MG tablet TAKE 1 TABLET(20 MG) BY MOUTH AT BEDTIME 90 tablet 0   pneumococcal 20-valent conjugate vaccine (PREVNAR 20) 0.5 ML injection Inject into the muscle. 0.5 mL 0   polyethylene glycol (MIRALAX / GLYCOLAX) 17 g packet Take 17 g by mouth daily as needed for mild constipation.     primidone (MYSOLINE) 50 MG tablet TAKE 2 TABLETS BY MOUTH EVERY MORNING AND 1 TABLET EVERY EVENING 270 tablet 0   Pyridoxine HCl (VITAMIN B-6) 500 MG tablet Take 500 mg by mouth 2 (two) times daily.     RSV vaccine recomb adjuvanted (AREXVY) 120 MCG/0.5ML injection Inject into the muscle. 0.5 mL 0   sennosides-docusate sodium (SENOKOT-S) 8.6-50 MG tablet Take 2 tablets by mouth as needed.     valACYclovir (VALTREX) 1000 MG tablet Take 2 tablets by mouth at onset, then 2 tablets 12 hours later. (Patient taking differently: Take 2,000 mg by mouth every 12 (twelve) hours. PRn) 30 tablet 1   vitamin B-12 (CYANOCOBALAMIN) 1000 MCG tablet Take 1,000 mcg by mouth at bedtime.     No current facility-administered medications for this visit.    Allergies  Allergen Reactions   Propranolol Other (See Comments)    Low blood  pressure. Bradycardia    Brexpiprazole Other (See Comments)    Aphasia and catatonia    Diagnoses:   ICD-10-CM   1. Generalized anxiety disorder  F41.1       Treatment goal plan of care: Patient not signing treatment goal plan due to continued concerns about COVID.  Treatment goals: Treatment goals remain on treatment plan as patient works with strategies to achieve her goals.  Progress is assessed each session and documented in the "plan/progress" section of treatment note. Treatment goals designed specifically for patient's needs and confirmed by her.  Being able to tolerate closeness and having a close family or  close friend nearby. Tends to state "I can't, I'm too weak" and family has tried to encourage her to to allow closeness. 2.   Decrease her worrying.  Plan of Care:    Today is the initial evaluation for Meghan Oliver as she was referred by her psychiatrist due to her anxiety and depression, and has a history of also being treated for Bipolar I and II.  Patient is an 82 year old female, married for 60 years to her husband who brought her to appointment today along with a medical provider that works with them in their home.  Patient and her husband have 2 adult children that they have remained close with even though the adult children do not live locally, but they have frequent contact with patient and their father so is to remain an active part of their lives.  Patient shared that she felt anxiety was her main problem and that "it sometimes causes her nausea depression, worrying about the future note, and low tolerance for frustration.  Reports the past 2 weeks, her depression and anxiety have both been worse but denies any thoughts to harm herself or anyone else.  Has helpful family members as well as a network of friendships both in their retirement community and in the larger city in which the event of and beyond.  Also exhibits some return memory issues at times.  Some rumination and obsessive thoughts and admits that she worries often about her and her husband's health.  Speech and language seem a bit slower today as are her motor skills.  Thought process does include some goal-directed this.  Denies any current substance abuse.  Worries frequently about "her need to go to the bathroom" and according to husband it is often a perceived need versus real need.  Can get rather obsessed about this.  Able to interact at times with friends and enjoy herself and family.  Worked with patient as much as she was able to come up with some clear meaningful goals, both of which were stated above.  As much as she is able,  she does seem committed to trying to work some in therapy especially around her anxiety, depression, and would like to feel stronger emotionally.  For other information regarding this patient, please see above sections of this initial evaluation.  Encouraged patient in practicing positive behaviors as discussed in session including: Remaining on her medication as prescribed, getting outside some as she is able, stay in the present focusing on what she can change or control, remaining in touch with supportive people, looking for more positives versus negatives each day, and recognize the strength she shows working with goal-directed behaviors to move in a direction that supports her improved emotional health and overall wellbeing.  Review of initial treatment goal plan and patient is in agreement.  Next appointment within 2 to 3 weeks.  This record has been created using [employer].  Chart creation errors have been sought, but may not always have been located and corrected.  Such creation errors do not reflect on the standard of medical care provided.   Shanon Ace, LCSW

## 2022-11-16 ENCOUNTER — Other Ambulatory Visit: Payer: Self-pay | Admitting: Psychiatry

## 2022-11-16 ENCOUNTER — Other Ambulatory Visit: Payer: Self-pay

## 2022-11-16 ENCOUNTER — Ambulatory Visit (HOSPITAL_COMMUNITY): Payer: Medicare Other

## 2022-11-16 ENCOUNTER — Other Ambulatory Visit (HOSPITAL_BASED_OUTPATIENT_CLINIC_OR_DEPARTMENT_OTHER): Payer: Self-pay

## 2022-11-16 ENCOUNTER — Other Ambulatory Visit: Payer: Self-pay | Admitting: Gastroenterology

## 2022-11-16 DIAGNOSIS — G250 Essential tremor: Secondary | ICD-10-CM

## 2022-11-16 DIAGNOSIS — F3181 Bipolar II disorder: Secondary | ICD-10-CM

## 2022-11-16 MED ORDER — DEXLANSOPRAZOLE 60 MG PO CPDR
DELAYED_RELEASE_CAPSULE | ORAL | 3 refills | Status: DC
  Filled 2022-11-16: qty 90, 90d supply, fill #0
  Filled 2023-02-17: qty 90, 90d supply, fill #1
  Filled 2023-05-23 – 2023-07-05 (×5): qty 90, 90d supply, fill #2
  Filled 2023-07-19 – 2023-09-24 (×3): qty 90, 90d supply, fill #3

## 2022-11-16 NOTE — Telephone Encounter (Signed)
[name], Icie's nurse called and requesting refills on Mirtazapine 30 mg #90, Primidone 50 mg #270 and Paxil 20 mg #90 called to:  [facility], [place], [place], [place].   Phone: [phone]  Fax: [phone]

## 2022-11-17 ENCOUNTER — Other Ambulatory Visit (HOSPITAL_BASED_OUTPATIENT_CLINIC_OR_DEPARTMENT_OTHER): Payer: Self-pay

## 2022-11-17 ENCOUNTER — Other Ambulatory Visit: Payer: Self-pay

## 2022-11-17 ENCOUNTER — Encounter (HOSPITAL_BASED_OUTPATIENT_CLINIC_OR_DEPARTMENT_OTHER): Payer: Medicare Other | Admitting: Physical Therapy

## 2022-11-17 MED ORDER — PRIMIDONE 50 MG PO TABS
100.0000 mg | ORAL_TABLET | Freq: Every morning | ORAL | 0 refills | Status: DC
  Filled 2022-11-17: qty 270, 90d supply, fill #0

## 2022-11-17 MED ORDER — PAROXETINE HCL 20 MG PO TABS
20.0000 mg | ORAL_TABLET | Freq: Every day | ORAL | 0 refills | Status: DC
  Filled 2022-11-17: qty 90, 90d supply, fill #0

## 2022-11-17 MED ORDER — MIRTAZAPINE 30 MG PO TABS
30.0000 mg | ORAL_TABLET | Freq: Every day | ORAL | 0 refills | Status: DC
  Filled 2022-11-17: qty 90, 90d supply, fill #0

## 2022-11-18 ENCOUNTER — Telehealth: Payer: Self-pay | Admitting: Psychiatry

## 2022-11-18 ENCOUNTER — Other Ambulatory Visit: Payer: Self-pay

## 2022-11-18 ENCOUNTER — Ambulatory Visit (INDEPENDENT_AMBULATORY_CARE_PROVIDER_SITE_OTHER): Payer: Self-pay | Admitting: Physician Assistant

## 2022-11-18 ENCOUNTER — Other Ambulatory Visit (HOSPITAL_BASED_OUTPATIENT_CLINIC_OR_DEPARTMENT_OTHER): Payer: Self-pay

## 2022-11-18 ENCOUNTER — Ambulatory Visit: Payer: Medicare Other

## 2022-11-18 VITALS — BP 143/76 | HR 73

## 2022-11-18 DIAGNOSIS — F314 Bipolar disorder, current episode depressed, severe, without psychotic features: Secondary | ICD-10-CM

## 2022-11-18 DIAGNOSIS — Z79899 Other long term (current) drug therapy: Secondary | ICD-10-CM

## 2022-11-18 DIAGNOSIS — F411 Generalized anxiety disorder: Secondary | ICD-10-CM

## 2022-11-18 DIAGNOSIS — F3181 Bipolar II disorder: Secondary | ICD-10-CM

## 2022-11-18 DIAGNOSIS — F061 Catatonic disorder due to known physiological condition: Secondary | ICD-10-CM

## 2022-11-18 MED ORDER — NORTRIPTYLINE HCL 25 MG PO CAPS
50.0000 mg | ORAL_CAPSULE | Freq: Every day | ORAL | 1 refills | Status: DC
  Filled 2022-11-18: qty 180, 90d supply, fill #0

## 2022-11-18 NOTE — Telephone Encounter (Signed)
Rx submitted to [facility]

## 2022-11-18 NOTE — Telephone Encounter (Signed)
Majel's nurse [name] said that Delmira needs a refill on her Nortriptyline 25 mg. [name] states she needs 180 tablets (2/day). She is requesting a new script. Please call to   [facility]   Phone: [phone]  Fax: [phone]

## 2022-11-19 ENCOUNTER — Other Ambulatory Visit: Payer: Self-pay | Admitting: Psychiatry

## 2022-11-19 ENCOUNTER — Telehealth: Payer: Self-pay | Admitting: Psychiatry

## 2022-11-19 ENCOUNTER — Other Ambulatory Visit (HOSPITAL_BASED_OUTPATIENT_CLINIC_OR_DEPARTMENT_OTHER): Payer: Self-pay

## 2022-11-19 DIAGNOSIS — F061 Catatonic disorder due to known physiological condition: Secondary | ICD-10-CM

## 2022-11-19 DIAGNOSIS — F411 Generalized anxiety disorder: Secondary | ICD-10-CM

## 2022-11-19 MED ORDER — LORAZEPAM 0.5 MG PO TABS
ORAL_TABLET | ORAL | 0 refills | Status: AC
  Filled 2022-11-19: qty 100, 12d supply, fill #0
  Filled 2022-11-30: qty 100, 12d supply, fill #1
  Filled 2023-01-04: qty 70, 18d supply, fill #2

## 2022-11-19 NOTE — Telephone Encounter (Signed)
Noted I will take care of this.

## 2022-11-19 NOTE — Telephone Encounter (Signed)
Lorazepam RF needed  increased dose-  0.48m  1 per six hours. Please send in a 90 day supply for her. KMaudie Mercuryis going out of town next week and will not give entire amount to pt.  [facility] : [facility]

## 2022-11-22 ENCOUNTER — Encounter: Payer: Medicare Other | Admitting: Psychiatry

## 2022-11-22 ENCOUNTER — Encounter (HOSPITAL_BASED_OUTPATIENT_CLINIC_OR_DEPARTMENT_OTHER): Payer: Medicare Other | Admitting: Physical Therapy

## 2022-11-23 NOTE — Progress Notes (Signed)
NURSES NOTE:   Patient arrived for her 2nd Spravato treatment. Pt is being treated for Treatment Resistant Depression, the starting dose is 56 mg (2 of the 28 mg) nasal sprays and she will also receive that dose today. Pt is under Dr. Casimiro Needle care, that is who will follow her treatment with Spravato as well. Patient was seen first by Rinaldo Cloud, LCSW, then treatment rooms are next to the same area so she walked right over. She is accompanied again with her nurse, [name] and husband, Dr. Lyla Son. I did go over things again with them all as well as [name]. I answered any questions and concerns the patient or anyone had. Pt's Spravato is ordered through [facility] and the pt pays out of pocket with them.  All Spravato medication is stored at doctors office per REMS/FDA guidelines. The medication is required to be locked behind two doors per FDA/REMS Protocol. Medication is also disposed of properly per regulations.      [name], her nurse gets the patient's vital signs since she is sitting next to her, 12:30 PM 157/72, pulse 68, Pulse Ox 99%. Instructed patient to blow her nose then recline back as well as she could. Pt was assisted some by nurse due to pt's age and shakiness with holding the nasal spray, administered in each nostril as directed and waited 5 more minutes for the second dose. After both doses given her nurse and husband stayed with her. [name], checked pt's 40 minute vitals at 1:15 PM, 171/92, pulse 74. [name] had advised her nurse to administer Metoprolol 25 mg tablet after her 40 minute, prescribed by her cardiologist. They report nothing else needed at this time. Explained she would be monitored for a total time of 120 minutes. Discharge vitals were taken at 2:30 PM 173/76, P 61. [name] came over to visit with patient and Dr. Velora Heckler and discuss how treatment went for her second treatment. Pt remains irritable about receiving treatments and feels like a failure today because it  didn't work like it did her 1st treatment. I explained she will not always get the same reaction at each visit, even if it's the same dose. Recommend she go home and sleep or just relax. Verbalized understanding from all of them. Pt. will be receiving 2 treatments per week for 4 weeks as recommended. She will get another dose of 56 mg while [name] is out of town for her 3rd treatment. Then reassessed at that time. Nurse was with pt a total of 60 minutes for clinical assessment. Pt is scheduled for her 3rd dose on Thursday, February 15th, 2024. Pt instructed to call office with any problems or questions before she returns.    [id number] EXP [id number]

## 2022-11-24 ENCOUNTER — Encounter: Payer: Self-pay | Admitting: Psychiatry

## 2022-11-24 ENCOUNTER — Ambulatory Visit: Payer: Medicare Other

## 2022-11-24 ENCOUNTER — Other Ambulatory Visit (HOSPITAL_BASED_OUTPATIENT_CLINIC_OR_DEPARTMENT_OTHER): Payer: Self-pay

## 2022-11-24 ENCOUNTER — Encounter (HOSPITAL_BASED_OUTPATIENT_CLINIC_OR_DEPARTMENT_OTHER): Payer: Medicare Other | Admitting: Physical Therapy

## 2022-11-24 ENCOUNTER — Ambulatory Visit (INDEPENDENT_AMBULATORY_CARE_PROVIDER_SITE_OTHER): Payer: Self-pay | Admitting: Psychiatry

## 2022-11-24 ENCOUNTER — Ambulatory Visit: Payer: Medicare Other | Admitting: Psychiatry

## 2022-11-24 VITALS — BP 152/59 | HR 61

## 2022-11-24 DIAGNOSIS — Z79899 Other long term (current) drug therapy: Secondary | ICD-10-CM

## 2022-11-24 DIAGNOSIS — F314 Bipolar disorder, current episode depressed, severe, without psychotic features: Secondary | ICD-10-CM

## 2022-11-24 DIAGNOSIS — G3184 Mild cognitive impairment, so stated: Secondary | ICD-10-CM

## 2022-11-24 DIAGNOSIS — F061 Catatonic disorder due to known physiological condition: Secondary | ICD-10-CM

## 2022-11-24 DIAGNOSIS — F411 Generalized anxiety disorder: Secondary | ICD-10-CM

## 2022-11-24 DIAGNOSIS — R3589 Other polyuria: Secondary | ICD-10-CM

## 2022-11-24 DIAGNOSIS — G251 Drug-induced tremor: Secondary | ICD-10-CM

## 2022-11-24 MED ORDER — LOREEV XR 2 MG PO CS24
1.0000 | EXTENDED_RELEASE_CAPSULE | Freq: Every morning | ORAL | 0 refills | Status: DC
  Filled 2022-11-24: qty 7, 7d supply, fill #0

## 2022-11-24 NOTE — Progress Notes (Addendum)
Meghan Oliver [id number] 1941/04/01 82 y.o.  Subjective:   Patient ID:  Meghan Oliver is a 82 y.o. (DOB Oct 07, 1940) female. Patient was last seen August 21, 2018 Chief Complaint:  Chief Complaint  Patient presents with   Follow-up   Depression   Anxiety   Altered Mental Status   Memory Loss   Stress     Meghan Oliver presents to the office today for follow-up of Severe TR bipolar depression with psychotic features including catatonia.  11/30/2018 was the last visit and the following was noted: Had ECT yesterday at [facility].  Last was Jan 8 and was doing well then.  Next scheduled April 15.  Had a lithium level from there which is pending. Pretty good until the last 10 days to 2 weeks with less energy and motivation.  Bothered by old sick dog.  Worried about how that will affect her.  Has slept with the dog.  Enjoyed [place].  Going to gym and playing [place].  Took the class and didn't feel she caught on quickly. Wonders if it is bc of the ECT memory effects.  Bridge is harder and not playing.  Planning to return to [facility] mid April.   spent the winter in [place] per usual..  No significant depression since here. ECT frequency about 6 weeks.  Gets very anxious the day of the ECT.  ECT consistently for a year.  Best in mood in 7-8 years.  Also the pacemaker helped.  Friends notice the benefit.  Plan:  Option increase in the nortriptyline if needed.  Considered this.  They agree and would like to increase nortriptyline to 75 to try to reduce tendency to depression before the ECT.   03/19/19  Addendum: Here the contents from an email from the nurse for Meghan Oliver  Hello [name],   I hope you and your family are healthy and well. I took Meghan Oliver to [facility] for ECT and here are labs from 01/26/19. I am going to also forward them to [facility] for follow up with her LFT's. They are still higher than I would like.  Her lithium level was 0.51 on a daily dose of '300mg'$  qohs x 4 nights- alternating with  '450mg'$  qohs x 3 nights. She is not demonstrating any signs of elevated levels, minimal hand tremors, less anxiety and restlessness since her ECT treatment.  Her Nortryptyline level was 140 on '75mg'$  qhs.  I would like to continue her current dosages of both the above meds and recheck her lithium level at her next scheduled ECT of 03/23/19.  Please let me know of any changes you would like to make. Take care, Meghan Quan RN Lithium level was 0.4 on the '450mg'$  alt with 300 mg QOD. No changes were made in meds.  11/15/2019 phone call: Telephone call from patient's husband [name] on 11/14/2019  Patient is scheduled for ECT soon for maintenance ECT.  He thinks it is been 2-1/2 months since the last ECT but he is going to check the date for sure.  It is still be done being done at [facility].  He is wondering about skipping this treatment because the patient has continued to be free of depression and seems cognitively clearer as these ECT treatments have been spread out further from each other.  Patient remains on medications as prescribed.  If it is truly been over 2 months since the last ECT then it is reasonable to consider discontinuing ECT.  It is  unlikely that ECT with the frequency of greater than 2 months is significantly helpful at preventing her relapse.  If however the ECT frequency is less than every 2 months it could still be helping to prevent recurrence.  He indicated he would consider this information and discuss it with her ECT team and make a decision.  They are heading to [place] in the next few weeks.  Needs to schedule an appointment with me for follow-up because it is been many months.  He agrees.   02/19/2020 appt, the following noted: Moving to [place] in a few months.  Ready to downsize.   Stopped ECT as discussed and has not been more deprressed.   Walks dog 4 times daily.   Lithium 0.7 on  01/30/20 on lithium 300 mg plus 150 mg on M, W, F Nortriptyline 70 on 75 mg HS. No SE  except tremor.  Balance is not great.  Very happy.   No depression since here.  No mood swings.  Sleep and appetite is OK.  No med changes:  04/03/2020 TC with the following noted: Patient's husband called stating that her tremor was a little worse and she was having some stutter. No evidence of stroke otherwise. First thing would be to check serum lithium level and BMP.  Order written.  Have asked her husband to let us know about the lab to which it should be sent.  04/22/20 TC witht he following noted: Lithium level is not dangerously high but it is has crept up to 1.0 which is higher than desired for her.  Our goal for her is 0.5-0.7.  At the current level it is likely causing side effects so we should reduce the dosage from lithium 300 mg plus 150 mg on M, W, F  TO 300 mg daily.  Meaning drop off the 150 mg capsule.  It will take about 2 weeks for the level to gradually come down to the desired level.  05/08/2020 appt with the following noted: Seen with H and [name] nurse. Moving to [facility] and feels OK about it.  I think I'll be fine.   Pretty good with balance.  Doing yoga and it helps. [name] has cancer. Old dog 84 & 1/2 yo still living. [name]'s concerns when went to [place] for D's BD she had difficulty time with tremor lethargy, more confusion.  Better when go back home. [name] notes using more lorazepam. At times gait is unsteady. Not significantly depressed.  Can be anxious at times.  Eating okay.  Sleeping okay.  Is easily confused under stress. Plan: reduce lithium to 300 mg nightly.  07/14/20 appt with the following noted: Seen with her husband as well as her family nurse. Feeling fine and pleased with that.   Some anxiety over the move to smaller place. [name]'s twin also having heart problems Gene. [name]'s kids came and visited.    Plan: Continue nortriptyline 75 mg, paroxetine 20 mg, lithium 300 mg, Namenda 10 mg twice daily, lorazepam 0.5 mg 1/2-1 3 times daily as needed,  mirtazapine 7.5 mg nightly  02/27/2021 appointment with the following noted:  Seen with H and nurse [place] email from nurse Meghan Oliver on 02/24/2021 indicated that he had moved into [facility] in May.  Farron has gradually become more depressed and more anxious using lorazepam as prescribed 3 times daily.  More memory issues.  She has remained engaged and initiating appropriate conversation.  She is very worried about her 27 year old dog  who is in poor health and fears she will become more depressed when the dog dies.  She and her husband do not wish to prefer to pursue further ECT unless absolutely necessary. Likes [facility] so far.   No depression by her report.  Sold big house in Feb.  It worked out well.   Worries about her dog 21 yo will die soon. H agrees she's done well with depression.   Enjoys things and has interests. Plan: Continue Lithium 300 mg daily  Continue nortriptyline 75 mg daily Continue paroxetine 20 mg daily Continue lorazepam 0.5 mg 3 times daily for both anxiety and catatonia Continue vitamin B6 and primidone for tremor Cerefolin NAC 2 daily.  05/13/2021 appointment with the following noted: Happy with [place].  H and she agree is doing well with depression.Kermit Balo socialization. She complains of memory.   Family visited and getting along with Baker Janus better.  H says Baker Janus is acting better.  Goes to dinner and activities together now.  Better relationship with Gene.. Tremor has been OK.   Ativan usually 0.25 mg TID and tolerated. Anxiety managed with this generally. Molly dog health more stable. To [place] before T'giving until April.   Plan: Continue Lithium 300 mg daily  Continue nortriptyline 75 mg daily Continue paroxetine 20 mg daily Continue lorazepam 0.5 mg 3 times daily for both anxiety and catatonia Continue vitamin B6 and primidone for tremor   08/14/2021 appointment with the following noted: Seen with her husband [name] and nurse  [name]. Everyone reports that her mood has been stable.  Her anxiety is manageable with as needed lorazepam.  She is still happy with the transition to [facility].  They are preparing to go to [place] for the winter per usual but may sell their house father there. Tolerating meds without any unusual side effects. Is dealing with grief over the loss of her dog Molly.  03/10/2022 appt noted:  seen with H and nurse Vicie Mutters [facility] house. [name] not good phsyically hard to play golf.  Been really hard. He'll be 85 in July.  Some stress scheduling with D's.   59 th wedding necessary this month.   Mood up and down.  Sister in law across the street can be difficult and demeaning.  She feels unsettled.  Some worry. Some back pain limiting activity.   Sleep is ok except with pain awakening.   No SE, except tremor some worse about 3-5 and helped by lorazepam 0.25 mg then No greater than 1 mg lorazepam daily Patient denies any recent difficulty with anxiety except as noted.  Patient denies difficulty with sleep initiation or maintenance. Denies appetite disturbance.    Patient has some difficulty with concentration.  Patient denies any suicidal ideation.  Usually takes Ativan at night and sleeps well 8 hours.   Plan no med changes  10/23/213 appt noted: Still doing well. Pneumonia and hosp for 6 days 3 weeks ago.  Viral.  Improving at this time. Increased tremor gradually in 3-4 mos helped by lorazepam avg 1.00-1.25 mg daily. No change in primidone. Total R hip August 2023 and done well.  Finishing home PT.   Appetite poor for 7-8 mos.  2 protein drinks daily but may not eat more than 1 good meal a day. [facility]. Substantial memory problems in hospital and in unusual enviornments. Nurse notices decline.  B with LBD passed in August. No pain meds or muscle relaxants beyond pain. No depression.  H note sometimes she has less  motivation than others. [name] notes some normal waxing and waning of mood. Good  social interaction.   Not spending winter in [facility] this year.  D still lives there. Plan: Increase mirtazapine 15 mg HS for appetite.  Also used for sleep and depression.  09/23/22 appt noted:  with nurse and [name] [name] feeling bad with more pain and fatigue.  [name] has CHF.   Made [place] sad.  Lost a cgood friends.  Rough time.  Mostly worry about [name].  She feels afraid to be alone if something happens.   Not sig depressed.    Just worry . H can't be as active.  Taking a shower is hard.   She's back exercising again.   Went to [employer] for Thanksgiving in [facility] and plan to go back for Christmas. It's hard to travel. Told nurse she was depressed. Appetite is better and once asleep is OK. Using more lorazepam for anxiety 3-4 daily. Plan: Continue Lithium 300 mg daily  Continue nortriptyline 75 mg daily Continue paroxetine 20 mg daily Continue lorazepam 0.5 mg 3 times daily for both anxiety and catatonia Continue vitamin B6 and primidone for tremor Increased and it helped mirtazapine 15 mg HS for appetite.  Also used for sleep and depression.  10/19/22  RTC  [name] reports not doing well.  Went to [facility].  Recent pneumonia.  They are thinking this is aspiration pneurmonia. Lethargic and anxious with tremor. Ruminating.  Hot to cold .  Low tolerance.  Going downhill quickly.  She's verbalizing depression and desire to isolate.  Won't go out.  Started within the last 2 weeks. Usually doesn't complain when she's sick.   [name]'s biggest concern is that Inocente Salles is having trouble handling this bc she's up at night rambling and not really safe to be up at night.    Will see her 10/22/22 Get lithium and nortriptyline levels.   Lynder Parents, MD, [facility]     10/22/22 emergency appt with nurse [name] and H [name]: Pneumonia Oct and then again 2nd week January.  It appears to have largely resoved.  It's really bad.  Nausea. Anxious bc [name] ill.  Doesn't want him to leave.  [name] sees anxiety triggering anxiety with little to no  appetite.  Hypersensitive to stimuli like light and noises.  Doesn't want TV on.  [name] says totally reversed in last 6 weeks.    [name] reports she's overemotional.  Friends died. Losing wt 91.4# today. More trouble with sleep. Lorazepam increased to 2 mg daily.  Not sleepy with it.  Doesn't seem to be helpful. Last week neglect hygiene but better this week a little. [name] says STM is poor at times and at times disoriented if in [place].  Will walk in bathroom and wonder where sh is. Barium swallow pending for evaluation of refulex.    10/26/22 RTC:  RTC   No improvement since Friday.  Gradually less engaged.  Can be overstimulated with visitors.   Disc nortriptyline level 115 is a little higher than it was and higher levels can cause SE in elderly.   Reduce nortriptyline to 50 mg HS. Taking lorazepam    Consider low dose of Vraylar or pramipexole.  Or Spravato.     Lynder Parents, MD, [facility]AP[facility]     11/15/22 appt noted: Dialogue with Dr. Aundra Dubin cardiologist agreed pt should be stable enough from CV perspective to receive Spravato which could elevate BP.  If that occurs use prn metoprolol 25 mg. Disc in detail with Dr.  Blincoe and he agrees Financial planner is appropriate though not FDA approved for bipolar depression.  Disc 2 metanalysis in support of this and this alernative is safer and likely better tolerated than the option of ECT and faster than options of trials of Trintellix, Auvelity, Latuda etc. Tolerating meds.  She is depressed and very anxious and intermittently confused.  Taking clothes on and off bc rapid changes in comfort.  Worried over [employer].  Sleep disrupted appetite some better with mirtazapine. Received Spravato 56 mg first time today and was dissociated.  Resolved over 2 hours.  No HA, NV.  Was distressed moderately and very ambivalent about Tx.  H and nurse supportive of tx plan and for the pt.    11/24/22 appt noted: Current meds: nortriptyline 50 mg HS, fluoxetine 20 mg daily, lithium  300 mg nightly, mirtazapine 30 mg nightly, memantine 10 mg twice daily.  Lorazepam 0.5 mg every 4 to 6 hours as needed catatonia or severe anxiety. Received first dose of Spravato 84 mg today.  She was more noticeably dissociated than with the 56 mg dose.  It did resolve over the 2-hour course of observation although she remained somewhat unsteady and needed to leave via wheelchair.  She did not have headache nausea or vomiting.  She remains ambivalent about the treatment. Husband notes she has continued to be anxious and depressed and was extremely anxious yesterday and somewhat confused.  At times would say things that did not make sense.  He remained supportive of the treatment while she remains ambivalent about the treatment.  Her appetite is good and she is eating well.  Sleep is variable.  H reported at times will be almost normal after receiving an extra dose of lorazepam 0.5 mg prn but this only lasts an hour or so.  No current alcohol problems  Saw Dr. Carles Collet last week and no change in neuro meds.  Past Psychiatric Medication Trials: Prozac with lithium and Zyprexa 2.5 to 5 mg,  nortriptyline, paroxetine, ,  pramipexole, mirtazapine,  Rexulti, Abilify, Vraylar, Latuda 10 mg,  lamotrigine.   Namenda,  lorazepam,   this loss list is not exhaustive  Review of Systems:  Review of Systems  Constitutional:  Positive for fatigue.  Respiratory:  Negative for shortness of breath.   Cardiovascular:  Negative for palpitations.  Gastrointestinal:  Positive for constipation and nausea.       Linzess and Mozantic managed   Musculoskeletal:  Positive for gait problem.  Neurological:  Positive for dizziness. Negative for tremors and weakness.       Some balance issues  Psychiatric/Behavioral:  Positive for decreased concentration and dysphoric mood. Negative for agitation, behavioral problems, confusion, hallucinations, self-injury, sleep disturbance and suicidal ideas. The patient is  nervous/anxious. The patient is not hyperactive.     Medications: I have reviewed the patient's current medications.  Current Outpatient Medications  Medication Sig Dispense Refill   amoxicillin (AMOXIL) 500 MG capsule Take 3 capsules (1,500 mg total) by mouth 1 hour prior to dental work. 9 capsule 1   amoxicillin-clavulanate (AUGMENTIN) 875-125 MG tablet Take 1 tablet by mouth 2 (two) times daily. 14 tablet 0   aspirin 81 MG chewable tablet Chew 1 tablet (81 mg total) by mouth daily.     Calcium Carbonate-Vitamin D (CALCIUM 600/VITAMIN D PO) Take 1 tablet by mouth 2 (two) times daily.     cetirizine (ZYRTEC) 10 MG tablet Take 10 mg by mouth daily as needed for allergies.  Cholecalciferol (VITAMIN D3) 50 MCG (2000 UT) TABS Take 2,000 Units by mouth in the morning.     COVID-19 mRNA vaccine 2023-2024 (COMIRNATY) syringe Inject into the muscle. 0.3 mL 0   dexlansoprazole (DEXILANT) 60 MG capsule Take 1 capsule by mouth daily 90 capsule 3   Esketamine HCl, 84 MG Dose, (SPRAVATO, 84 MG DOSE,) 28 MG/DEVICE SOPK Place 84 mg into the nose every 3 (three) days. 3 each 5   fesoterodine (TOVIAZ) 8 MG TB24 tablet Take 1 tablet (8 mg total) by mouth daily. 90 tablet 3   glycerin adult 2 g suppository Use 1 suppository as needed if you have not had a bowel movement in  4 to 5 days 25 suppository 0   influenza vaccine adjuvanted (FLUAD QUADRIVALENT) 0.5 ML injection Inject into the muscle. 0.5 mL 0   Krill Oil 500 MG CAPS Take 500 mg by mouth daily.     lactulose (CHRONULAC) 10 GM/15ML solution Take 30 mLs (20 g total) by mouth 3 (three) times daily between meals as needed for mild constipation. (Patient taking differently: Take 20 g by mouth daily as needed for mild constipation.) 473 mL 3   Lavender Oil 80 MG CAPS Take 160 mg by mouth at bedtime.     levothyroxine (SYNTHROID) 75 MCG tablet Take 1 tablet (75 mcg total) by mouth daily. 90 tablet 2   levothyroxine (SYNTHROID) 75 MCG tablet Take 1 tablet  (75 mcg total) by mouth daily. 90 tablet 2   linaclotide (LINZESS) 72 MCG capsule Take 2 capsules (144 mcg total) by mouth daily before breakfast. 180 capsule 1   lithium carbonate 300 MG capsule Take 1 capsule (300 mg total) by mouth at bedtime. 90 capsule 0   LORazepam (ATIVAN) 0.5 MG tablet TAKE 2 TABLETS (1 MG)  BY MOUTH EVERY NIGHT AT BEDTIME AND 1 TABLET EVERY 4 HOURS AS NEEDED FOR ANXIETY 270 tablet 0   magic mouthwash w/lidocaine SOLN Take 5 mLs by mouth 3 (three) times daily as needed for mouth pain. 120 mL 0   memantine (NAMENDA) 10 MG tablet Take 1 tablet (10 mg total) by mouth 2 (two) times daily. 180 tablet 1   metoprolol tartrate (LOPRESSOR) 25 MG tablet Take 1 tablet (25 mg total) by mouth as needed. 45 tablet 3   mirtazapine (REMERON) 30 MG tablet Take 1 tablet (30 mg total) by mouth at bedtime. 90 tablet 0   Multiple Vitamins-Minerals (MULTIVITAMIN WITH MINERALS) tablet Take 1 tablet by mouth daily.     Multiple Vitamins-Minerals (PRESERVISION AREDS 2 PO) Take 1 capsule by mouth in the morning and at bedtime.     naloxegol oxalate (MOVANTIK) 12.5 MG TABS tablet Take 2 tablets (25 mg total) by mouth daily. (Patient taking differently: Take 25 mg by mouth daily. PRN) 60 tablet 3   nortriptyline (PAMELOR) 25 MG capsule Take 2 capsules (50 mg total) by mouth at bedtime. 180 capsule 1   ondansetron (ZOFRAN-ODT) 4 MG disintegrating tablet Place 1 tablet every 6 hours by translingual route as needed. 20 tablet 0   PARoxetine (PAXIL) 20 MG tablet Take 1 tablet (20 mg total) by mouth at bedtime. 90 tablet 0   pneumococcal 20-valent conjugate vaccine (PREVNAR 20) 0.5 ML injection Inject into the muscle. 0.5 mL 0   polyethylene glycol (MIRALAX / GLYCOLAX) 17 g packet Take 17 g by mouth daily as needed for mild constipation.     primidone (MYSOLINE) 50 MG tablet TAKE 2 TABLETS BY MOUTH EVERY MORNING AND  1 TABLET EVERY EVENING 270 tablet 0   Pyridoxine HCl (VITAMIN B-6) 500 MG tablet Take 500 mg  by mouth 2 (two) times daily.     RSV vaccine recomb adjuvanted (AREXVY) 120 MCG/0.5ML injection Inject into the muscle. 0.5 mL 0   sennosides-docusate sodium (SENOKOT-S) 8.6-50 MG tablet Take 2 tablets by mouth as needed.     valACYclovir (VALTREX) 1000 MG tablet Take 2 tablets by mouth at onset, then 2 tablets 12 hours later. (Patient taking differently: Take 2,000 mg by mouth every 12 (twelve) hours. PRn) 30 tablet 1   vitamin B-12 (CYANOCOBALAMIN) 1000 MCG tablet Take 1,000 mcg by mouth at bedtime.     LORazepam ER (LOREEV XR) 2 MG CS24 Take 1 tablet by mouth every morning. (Patient not taking: Reported on 11/24/2022) 7 capsule 0   No current facility-administered medications for this visit.    Medication Side Effects: Other: tremor  stable.  No worse.  Allergies:  Allergies  Allergen Reactions   Propranolol Other (See Comments)    Low blood pressure. Bradycardia    Brexpiprazole Other (See Comments)    Aphasia and catatonia    Past Medical History:  Diagnosis Date   Anxiety    Arthritis    "hips, spine" (03/17/2018)   Bipolar II disorder (HCC)    CHF (congestive heart failure) (HCC)    Chronic bronchitis (HCC)    Chronic lower back pain    Chronic right hip pain    CKD (chronic kidney disease), stage II    Coronary artery disease    stent x1   Esophagitis, erosive    GAD (generalized anxiety disorder)    GERD (gastroesophageal reflux disease)    Headache    "maybe monthly" (03/17/2018))   Heart murmur, systolic    History of adenomatous polyp of colon    08-04-2016  tubular adenoma   History of blood transfusion 12/2017   "related to vascular hematoma"   History of electroconvulsive therapy    at [facility]--  started 04-15-2015 to 11-17-2016  total greater than 40 times   History of hiatal hernia    Hyperlipidemia    Hypertension    Hypothyroidism    Internal carotid artery stenosis, bilateral    per last duplex 05-01-2014  bilateral ICA 40-59%   Major depression,  chronic    ECT treatments extensive and multiple started 07/ 2016   Memory loss    "both short and long-term; needs frequent reminders to follow instrucitons" (05/16/2017)   Migraines    "none in years" (03/17/2018)   OSA (obstructive sleep apnea)    per study 06/ 2012 moderate OSA  ; "refuses to wear masks" (03/17/2018)   Osteoporosis    Pneumonia 07/29/2022   Poor historian    due to short term memory loss   Presence of permanent cardiac pacemaker 03/17/2018   Pulmonary nodule    monitored by pcp   S/P placement of cardiac pacemaker 03/17/18 [facility]  03/18/2018   Short-term memory loss    Sick sinus syndrome (HCC)     Family History  Problem Relation Age of Onset   Heart attack Father 64       deceased   Hypertension Father    Heart disease Father    Dementia Brother    Parkinson's disease Brother    Heart disease Brother    Breast cancer Paternal Aunt        Age 21's   Breast cancer Paternal Grandmother  Age unknown   Colon cancer Neg Hx     Social History   Socioeconomic History   Marital status: Married    Spouse name: Dr. Lyla Son   Number of children: 2   Years of education: Not on file   Highest education level: Not on file  Occupational History   Occupation: housewife    Employer: UNEMPLOYED  Tobacco Use   Smoking status: Former    Packs/day: 2.00    Years: 15.00    Total pack years: 30.00    Types: Cigarettes    Quit date: 01/13/1971    Years since quitting: 51.8    Passive exposure: Never   Smokeless tobacco: Never  Vaping Use   Vaping Use: Never used  Substance and Sexual Activity   Alcohol use: Yes    Comment: occassional 1 x a week   Drug use: Never   Sexual activity: Not Currently    Comment: intercourse age 38, sexual partners less than 5  Other Topics Concern   Not on file  Social History Narrative   Right handed   Social Determinants of Health   Financial Resource Strain: Not on file  Food Insecurity: No Food Insecurity  (07/06/2022)   Hunger Vital Sign    Worried About Running Out of Food in the Last Year: Never true    Ran Out of Food in the Last Year: Never true  Transportation Needs: No Transportation Needs (07/06/2022)   PRAPARE - Hydrologist (Medical): No    Lack of Transportation (Non-Medical): No  Physical Activity: Not on file  Stress: Not on file  Social Connections: Not on file  Intimate Partner Violence: Not At Risk (07/06/2022)   Humiliation, Afraid, Rape, and Kick questionnaire    Fear of Current or Ex-Partner: No    Emotionally Abused: No    Physically Abused: No    Sexually Abused: No    Past Medical History, Surgical history, Social history, and Family history were reviewed and updated as appropriate.   Please see review of systems for further details on the patient's review from today.   Objective:   Physical Exam:  LMP  (LMP Unknown)   Physical Exam Constitutional:      General: She is not in acute distress.    Appearance: She is well-developed.  Musculoskeletal:        General: No deformity.  Neurological:     Mental Status: She is alert and oriented to person, place, and time.     Motor: No tremor.     Coordination: Coordination abnormal.     Comments: Cane use  Psychiatric:        Attention and Perception: She is attentive.        Mood and Affect: Mood is anxious and depressed. Affect is not labile, blunt, tearful or inappropriate.        Speech: Speech is not slurred.        Behavior: Behavior normal. Behavior is not slowed or withdrawn.        Thought Content: Thought content normal. Thought content is not delusional. Thought content does not include homicidal or suicidal ideation. Thought content does not include suicidal plan.        Cognition and Memory: She exhibits impaired recent memory.        Judgment: Judgment normal.     Comments: Insight fair to good. No auditory or visual hallucinations.  Depression and anxiety are worse  lately Gait ok.  Memory appears stable to maybe a little worse.     Lab Review:     Component Value Date/Time   NA 137 11/11/2022 1106   NA 136 04/20/2017 1151   K 4.6 11/11/2022 1106   CL 106 11/11/2022 1106   CO2 23 11/11/2022 1106   GLUCOSE 97 11/11/2022 1106   BUN 24 11/11/2022 1106   BUN 24 04/20/2017 1151   CREATININE 0.73 11/11/2022 1106   CALCIUM 9.4 11/11/2022 1106   PROT 6.9 11/11/2022 1106   PROT 6.2 04/20/2017 1151   ALBUMIN 3.9 09/02/2022 1221   ALBUMIN 3.9 04/20/2017 1151   AST 44 (H) 11/11/2022 1106   ALT 86 (H) 11/11/2022 1106   ALKPHOS 64 09/02/2022 1221   BILITOT 0.2 11/11/2022 1106   BILITOT <0.2 04/20/2017 1151   GFRNONAA >60 07/08/2022 0429   GFRNONAA 50 (L) 07/10/2020 1021   GFRAA 58 (L) 07/10/2020 1021       Component Value Date/Time   WBC 6.6 11/11/2022 1106   RBC 3.88 11/11/2022 1106   HGB 12.1 11/11/2022 1106   HGB 10.2 (L) 04/20/2017 1151   HCT 36.3 11/11/2022 1106   HCT 29.6 (L) 04/20/2017 1151   PLT 215 11/11/2022 1106   PLT 385 (H) 04/20/2017 1151   MCV 93.6 11/11/2022 1106   MCV 89 04/20/2017 1151   MCH 31.2 11/11/2022 1106   MCHC 33.3 11/11/2022 1106   RDW 13.7 11/11/2022 1106   RDW 13.9 04/20/2017 1151   LYMPHSABS 1,439 11/11/2022 1106   LYMPHSABS 1.5 04/20/2017 1151   MONOABS 0.7 07/07/2022 0458   EOSABS 112 11/11/2022 1106   EOSABS 0.3 04/20/2017 1151   BASOSABS 53 11/11/2022 1106   BASOSABS 0.0 04/20/2017 1151    Lithium Lvl  Date Value Ref Range Status  10/21/2022 0.5 (L) 0.6 - 1.2 mmol/L Final  10/21/2022 lithium level 0.5 on 300 mg nightly is stable.,  Nortriptyline 115 on 75 mg HS.  08/02/22 nortriptyline 85, lihtium 0.5  Nortriptyline level 51 on 50 mg nightly with paroxetine 20 mg daily  07/10/2020 lithium level 0.6 at [facility] office and nortriptyline level 130 on the current dosages of lithium 300 mg nightly and nortriptyline 75 mg nightly  02/20/2021 labs lithium 0.6 on 300 mg nightly.  Nortriptyline  level 49 which is lower than expected on 75 mg nightly along with paroxetine 20 mg daily.    No results found for: "PHENYTOIN", "PHENOBARB", "VALPROATE", "CBMZ"   .res Assessment: Plan:    Severe bipolar I disorder, current or most recent episode depressed, with catatonia ([place]) - Plan: LORazepam ER (LOREEV XR) 2 MG CS24  Generalized anxiety disorder - Plan: LORazepam ER (LOREEV XR) 2 MG CS24  Mild cognitive impairment  Lithium-induced tremor  Lithium use  Polyuria    History of lithium toxicity repeatedly.  Hx TX resistant bipolar depression with catatonia.  Recent decompensation and rapidly getting worse with some cognive impairment and mild psychotic sx and altered mental status.Depression and anxiety much worse in the last several weeks.  Her case has been highly complex and treatment resistant at times. Disc pros and cons of return to ECT.  She is strongly opposed at this time.   Dialogue with Dr. Aundra Dubin cardiologist agreed pt should be stable enough from CV perspective to receive Spravato which could elevate BP.  If that occurs use prn metoprolol 25 mg. Disc in detail with Dr. Velora Heckler and he agrees Ebony Cargo is appropriate though not FDA approved for bipolar depression.  Disc 2  metanalysis in support of this and this alernative is safer and likely better tolerated than the option of ECT and faster than options of trials of Trintellix, Auvelity, Latuda etc.  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  During the severe depression she has catatonia.  She had required longterm ECT.  A previous episode of depression approximately 10 years ago stayed in remission on Paxil and nortriptyline and lithium for a period of years until the lithium level was decreased due to tremor.  Now we are maintaining an adequate lithium level.  She has had several episodes of lithium toxicity with hospitalization.  This resolved once she stopped chlorthalidone.   From records it appears her last ECT was in September 2020.    Patient was administered Spravato 84 mg intranasally today.  The patient experienced the typical dissociation which gradually resolved over the 2-hour period of observation.  There were no complications.  Specifically the patient did not have nausea or vomiting or headache.  Blood pressures did go up at 40-minute and 2-hour follow-up intervals.  By the time the 2-hour observation period was met the patient was alert and oriented and able to exit without assistance.  Patient feels the Spravato administration is helpful for the treatment resistant depression and would like to continue the treatment.  See nursing note for further details. She received the metoprolol 25 mg prn before starting Spravato  Will review the old chart and consider augmentation with very low-dose Vraylar or perhaps pramipexole  She seems to be tolerating the meds well other than the lithium tremor which is being fairly  managed with primidone.  She is continuing under the care of her neurologist Dr. Carles Collet.  The nortriptyline was increased to 75 to try to reduce tendency to depression in February 2020.  08/02/22 nortriptyline 85, lihtium 0.5 Received 10/26/22= 115.  Reduced nortriptyline to 50 mg HS to protect from SE including jitteriness and reduced appetite and cognitive dysfunction and increase mirtazapine instead for mood, anxiety, appetite, and sleep  Lithium level goals are between 0.5 and 0.7.  She has a history of lithium toxicity when she was taking chlorthalidone with the lithium.   Discussed signs and symptoms of lithium toxicity.  Discussed the risk of increasing depression if the lithium level gets too low.  She cannot tolerate higher doses of lithium.  There remains some concern about drug interactions between paroxetine and nortriptyline and given her age we do not want the nortriptyline levels higher than necessary .  Disc risk polypharmacy  We  discussed the short-term risks associated with benzodiazepines including sedation and increased fall risk among others.  Discussed long-term side effect risk including dependence, potential withdrawal symptoms, and the potential eventual dose-related risk of dementia.  But recent studies from 2020 dispute this association between benzodiazepines and dementia risk. Newer studies in 2020 do not support an association with dementia.  Continue Lithium 300 mg daily  Continue nortriptyline 50 mg daily Continue paroxetine 20 mg daily Continue vitamin B6 and primidone for tremor Imirtazapine 30 mg HS for appetite,  used for sleep and depression.  Appetite better Switch lorazepam to [facility] ER 2 mg AM and use lorazepam 0.5 mg prn.  For catatonia in hopes of more consistent relief.  Decision based on observation of periods of almost normalcy after some BZ doses.  We discussed potential switch of paroxetine and nortriptyline to Auvelity or Trintellix or possibly [place].  However these switches will be difficult and it  is hoped that the Spravato may provide enough improvement to allow a switch in medicine more tolerable.  Continue Spravato 84 mg twice weekly.  Disc BZ can diminish benefit but catatonia requires BZ  Lynder Parents MD, DFAPA Please see After Visit Summary for patient specific instructions.  Future Appointments  Date Time Provider [place]  11/26/2022 10:00 AM Cottle, Billey Co., MD CP-CP None  11/26/2022 10:00 AM CP-NURSE CP-CP None  11/29/2022  2:00 PM [facility] 1 [facility]CT [facility]  12/02/2022 10:00 AM Shanon Ace, LCSW CP-CP None  12/03/2022 11:00 AM Mauri Pole, MD [facility]  01/03/2023  3:20 PM [facility] DEVICE REMOTES [facility]  04/04/2023  3:20 PM [facility] DEVICE REMOTES [facility]  07/04/2023  3:20 PM [facility] DEVICE REMOTES [facility]  10/03/2023  7:00 AM [facility] DEVICE REMOTES [facility]      No  orders of the defined types were placed in this encounter.      -------------------------------

## 2022-11-24 NOTE — Progress Notes (Addendum)
Meghan Oliver [id number] May 04, 1941 82 y.o.  Subjective:   Patient ID:  Meghan Oliver is a 82 y.o. (DOB 08/18/1941) female. Patient was last seen August 21, 2018 Chief Complaint:  Chief Complaint  Patient presents with   Follow-up   Depression   Anxiety   Fatigue   Altered Mental Status   Stress     [name] presents to the office today for follow-up of Severe TR bipolar depression with psychotic features including catatonia.  11/30/2018 was the last visit and the following was noted: Had ECT yesterday at [facility].  Last was Jan 8 and was doing well then.  Next scheduled April 15.  Had a lithium level from there which is pending. Pretty good until the last 10 days to 2 weeks with less energy and motivation.  Bothered by old sick dog.  Worried about how that will affect her.  Has slept with the dog.  Enjoyed [place].  Going to gym and playing [place].  Took the class and didn't feel she caught on quickly. Wonders if it is bc of the ECT memory effects.  Bridge is harder and not playing.  Planning to return to [facility] mid April.   spent the winter in [place] per usual..  No significant depression since here. ECT frequency about 6 weeks.  Gets very anxious the day of the ECT.  ECT consistently for a year.  Best in mood in 7-8 years.  Also the pacemaker helped.  Friends notice the benefit.  Plan:  Option increase in the nortriptyline if needed.  Considered this.  They agree and would like to increase nortriptyline to 75 to try to reduce tendency to depression before the ECT.   03/19/19  Addendum: Here the contents from an email from the nurse for [name]  Hello [name],   I hope you and your family are healthy and well. I took Zobia to [facility] for ECT and here are labs from 01/26/19. I am going to also forward them to [facility] for follow up with her LFT's. They are still higher than I would like.  Her lithium level was 0.51 on a daily dose of 310m qohs x 4 nights- alternating with 4548m qohs x 3 nights. She is not demonstrating any signs of elevated levels, minimal hand tremors, less anxiety and restlessness since her ECT treatment.  Her Nortryptyline level was 140 on 7541mhs.  I would like to continue her current dosages of both the above meds and recheck her lithium level at her next scheduled ECT of 03/23/19.  Please let me know of any changes you would like to make. Take care, [name] Lithium level was 0.4 on the 450m81mt with 300 mg QOD. No changes were made in meds.  11/15/2019 phone call: Telephone call from patient's husband [name]2/07/2020  Patient is scheduled for ECT soon for maintenance ECT.  He thinks it is been 2-1/2 months since the last ECT but he is going to check the date for sure.  [facility] is [facility] is wondering about skipping this treatment because the patient has continued to be free of depression and seems cognitively clearer as these ECT treatments have been spread out further from each other.  Patient remains on medications as prescribed.  If it is truly been over 2 months since the last ECT then it is reasonable to consider discontinuing ECT.  It is unlikely  that ECT with the frequency of greater than 2 months is significantly helpful at preventing her relapse.  If however the ECT frequency is less than every 2 months it could still be helping to prevent recurrence.  He indicated he would consider this information and discuss it with her ECT team and make a decision.  They are heading to [place] in the next few weeks.  Needs to schedule an appointment with me for follow-up because it is been many months.  He agrees.   02/19/2020 appt, the following noted: Moving to [place] in a few months.  Ready to downsize.   Stopped ECT as discussed and has not been more deprressed.   Walks dog 4 times daily.   Lithium 0.7 on  01/30/20 on lithium 300 mg plus 150 mg on M, W, F Nortriptyline 70 on 75 mg HS. No SE except  tremor.  Balance is not great.  Very happy.   No depression since here.  No mood swings.  Sleep and appetite is OK.  No med changes:  04/03/2020 TC with the following noted: Patient's husband called stating that her tremor was a little worse and she was having some stutter. No evidence of stroke otherwise. First thing would be to check serum lithium level and BMP.  Order written.  Have asked her husband to let us know about the lab to which it should be sent.  04/22/20 TC witht he following noted: Lithium level is not dangerously high but it is has crept up to 1.0 which is higher than desired for her.  Our goal for her is 0.5-0.7.  At the current level it is likely causing side effects so we should reduce the dosage from lithium 300 mg plus 150 mg on M, W, F  TO 300 mg daily.  Meaning drop off the 150 mg capsule.  It will take about 2 weeks for the level to gradually come down to the desired level.  05/08/2020 appt with the following noted: Seen with H and [name] nurse. Moving to [facility] and feels OK about it.  I think I'll be fine.   Pretty good with balance.  Doing yoga and it helps. [name] has cancer. Old dog 51 & 1/2 yo still living. [name]'s concerns when went to [place] for D's BD she had difficulty time with tremor lethargy, more confusion.  Better when go back home. [name] notes using more lorazepam. At times gait is unsteady. Not significantly depressed.  Can be anxious at times.  Eating okay.  Sleeping okay.  Is easily confused under stress. Plan: reduce lithium to 300 mg nightly.  07/14/20 appt with the following noted: Seen with her husband as well as her family nurse. Feeling fine and pleased with that.   Some anxiety over the move to smaller place. [name]'s twin also having heart problems Gene. [name]'s kids came and visited.    Plan: Continue nortriptyline 75 mg, paroxetine 20 mg, lithium 300 mg, Namenda 10 mg twice daily, lorazepam 0.5 mg 1/2-1 3 times daily as needed, mirtazapine  7.5 mg nightly  02/27/2021 appointment with the following noted:  Seen with H and nurse [employer] email from nurse [name] on 02/24/2021 indicated that he had moved into [facility] in May.  Maddie has gradually become more depressed and more anxious using lorazepam as prescribed 3 times daily.  More memory issues.  She has remained engaged and initiating appropriate conversation.  She is very worried about her 54 year old dog who  is in poor health and fears she will become more depressed when the dog dies.  She and her husband do not wish to prefer to pursue further ECT unless absolutely necessary. Likes [facility] so far.   No depression by her report.  Sold big house in Feb.  It worked out well.   Worries about her dog 87 yo will die soon. H agrees she's done well with depression.   Enjoys things and has interests. Plan: Continue Lithium 300 mg daily  Continue nortriptyline 75 mg daily Continue paroxetine 20 mg daily Continue lorazepam 0.5 mg 3 times daily for both anxiety and catatonia Continue vitamin B6 and primidone for tremor Cerefolin NAC 2 daily.  05/13/2021 appointment with the following noted: Happy with [place].  H and she agree is doing well with depression.Kermit Balo socialization. She complains of memory.   Family visited and getting along with Baker Janus better.  H says Baker Janus is acting better.  Goes to dinner and activities together now.  Better relationship with Gene.. Tremor has been OK.   Ativan usually 0.25 mg TID and tolerated. Anxiety managed with this generally. Molly dog health more stable. To [place] before T'giving until April.   Plan: Continue Lithium 300 mg daily  Continue nortriptyline 75 mg daily Continue paroxetine 20 mg daily Continue lorazepam 0.5 mg 3 times daily for both anxiety and catatonia Continue vitamin B6 and primidone for tremor   08/14/2021 appointment with the following noted: Seen with her husband [name] and nurse [name]. Everyone  reports that her mood has been stable.  Her anxiety is manageable with as needed lorazepam.  She is still happy with the transition to [facility].  They are preparing to go to [place] for the winter per usual but may sell their house father there. Tolerating meds without any unusual side effects. Is dealing with grief over the loss of her dog Molly.  03/10/2022 appt noted:  seen with H and nurse Vicie Mutters [facility] house. [name] not good phsyically hard to play golf.  Been really hard. He'll be 85 in July.  Some stress scheduling with D's.   87 th wedding necessary this month.   Mood up and down.  Sister in law across the street can be difficult and demeaning.  She feels unsettled.  Some worry. Some back pain limiting activity.   Sleep is ok except with pain awakening.   No SE, except tremor some worse about 3-5 and helped by lorazepam 0.25 mg then No greater than 1 mg lorazepam daily Patient denies any recent difficulty with anxiety except as noted.  Patient denies difficulty with sleep initiation or maintenance. Denies appetite disturbance.    Patient has some difficulty with concentration.  Patient denies any suicidal ideation.  Usually takes Ativan at night and sleeps well 8 hours.   Plan no med changes  10/23/213 appt noted: Still doing well. Pneumonia and hosp for 6 days 3 weeks ago.  Viral.  Improving at this time. Increased tremor gradually in 3-4 mos helped by lorazepam avg 1.00-1.25 mg daily. No change in primidone. Total R hip August 2023 and done well.  Finishing home PT.   Appetite poor for 7-8 mos.  2 protein drinks daily but may not eat more than 1 good meal a day. [facility]. Substantial memory problems in hospital and in unusual enviornments. Nurse notices decline.  B with LBD passed in August. No pain meds or muscle relaxants beyond pain. No depression.  H note sometimes she has less motivation  than others. [name] notes some normal waxing and waning of mood. Good social interaction.    Not spending winter in [facility] this year.  D still lives there. Plan: Increase mirtazapine 15 mg HS for appetite.  Also used for sleep and depression.  09/23/22 appt noted:  with nurse and [name] [name] feeling bad with more pain and fatigue.  [name] has CHF.   Made Jaynia sad.  Lost a cgood friends.  Rough time.  Mostly worry about [name].  She feels afraid to be alone if something happens.   Not sig depressed.    Just worry . H can't be as active.  Taking a shower is hard.   She's back exercising again.   Went to [employer] for Thanksgiving in [facility] and plan to go back for Christmas. It's hard to travel. Told nurse she was depressed. Appetite is better and once asleep is OK. Using more lorazepam for anxiety 3-4 daily. Plan: Continue Lithium 300 mg daily  Continue nortriptyline 75 mg daily Continue paroxetine 20 mg daily Continue lorazepam 0.5 mg 3 times daily for both anxiety and catatonia Continue vitamin B6 and primidone for tremor Increased and it helped mirtazapine 15 mg HS for appetite.  Also used for sleep and depression.  10/19/22  RTC  [name] reports not doing well.  Went to [facility].  Recent pneumonia.  They are thinking this is aspiration pneurmonia. Lethargic and anxious with tremor. Ruminating.  Hot to cold .  Low tolerance.  Going downhill quickly.  She's verbalizing depression and desire to isolate.  Won't go out.  Started within the last 2 weeks. Usually doesn't complain when she's sick.   [name]'s biggest concern is that Inocente Salles is having trouble handling this bc she's up at night rambling and not really safe to be up at night.    Will see her 10/22/22 Get lithium and nortriptyline levels.   Lynder Parents, MD, [facility]     10/22/22 emergency appt with nurse [name] and H [name]: Pneumonia Oct and then again 2nd week January.  It appears to have largely resoved.  It's really bad.  Nausea. Anxious bc [name] ill.  Doesn't want him to leave.  [name] sees anxiety triggering anxiety with little to no appetite.  Hypersensitive  to stimuli like light and noises.  Doesn't want TV on.  [name] says totally reversed in last 6 weeks.    [name] reports she's overemotional.  Friends died. Losing wt 91.4# today. More trouble with sleep. Lorazepam increased to 2 mg daily.  Not sleepy with it.  Doesn't seem to be helpful. Last week neglect hygiene but better this week a little. [name] says STM is poor at times and at times disoriented if in [place].  Will walk in bathroom and wonder where sh is. Barium swallow pending for evaluation of refulex.    10/26/22 RTC:  RTC   No improvement since Friday.  Gradually less engaged.  Can be overstimulated with visitors.   Disc nortriptyline level 115 is a little higher than it was and higher levels can cause SE in elderly.   Reduce nortriptyline to 50 mg HS. Taking lorazepam    Consider low dose of Vraylar or pramipexole.  Or Spravato.     Lynder Parents, MD, [facility]     11/15/22 appt noted: Dialogue with Dr. Aundra Dubin cardiologist agreed pt should be stable enough from CV perspective to receive Spravato which could elevate BP.  If that occurs use prn metoprolol 25 mg. Disc in detail with Dr. Velora Heckler  and he agrees Ebony Cargo is appropriate though not FDA approved for bipolar depression.  Disc 2 metanalysis in support of this and this alernative is safer and likely better tolerated than the option of ECT and faster than options of trials of Trintellix, Auvelity, Latuda etc. Tolerating meds.  She is depressed and very anxious and intermittently confused.  Taking clothes on and off bc rapid changes in comfort.  Worried over [employer].  Sleep disrupted appetite some better with mirtazapine. Received Spravato 56 mg first time today and was dissociated.  Resolved over 2 hours.  No HA, NV.  Was distressed moderately and very ambivalent about Tx.  H and nurse supportive of tx plan and for the pt.    No current alcohol problems  Saw Dr. Carles Collet last week and no change in neuro meds.  Past Psychiatric Medication  Trials: Prozac with lithium and Zyprexa 2.5 to 5 mg,  nortriptyline, paroxetine, ,  pramipexole, mirtazapine,  Rexulti, Abilify, Vraylar, Latuda 10 mg,  lamotrigine.   Namenda,  lorazepam,   this loss list is not exhaustive  Review of Systems:  Review of Systems  Constitutional:  Positive for fatigue.  Respiratory:  Negative for shortness of breath.   Cardiovascular:  Negative for palpitations.  Gastrointestinal:  Positive for constipation and nausea.       Linzess and Mozantic managed   Musculoskeletal:  Positive for gait problem.  Neurological:  Negative for dizziness, tremors and weakness.       Some balance issues  Psychiatric/Behavioral:  Positive for decreased concentration and dysphoric mood. Negative for agitation, behavioral problems, confusion, hallucinations, self-injury, sleep disturbance and suicidal ideas. The patient is nervous/anxious. The patient is not hyperactive.     Medications: I have reviewed the patient's current medications.  Current Outpatient Medications  Medication Sig Dispense Refill   amoxicillin (AMOXIL) 500 MG capsule Take 3 capsules (1,500 mg total) by mouth 1 hour prior to dental work. 9 capsule 1   amoxicillin-clavulanate (AUGMENTIN) 875-125 MG tablet Take 1 tablet by mouth 2 (two) times daily. 14 tablet 0   aspirin 81 MG chewable tablet Chew 1 tablet (81 mg total) by mouth daily.     Calcium Carbonate-Vitamin D (CALCIUM 600/VITAMIN D PO) Take 1 tablet by mouth 2 (two) times daily.     cetirizine (ZYRTEC) 10 MG tablet Take 10 mg by mouth daily as needed for allergies.     Cholecalciferol (VITAMIN D3) 50 MCG (2000 UT) TABS Take 2,000 Units by mouth in the morning.     COVID-19 mRNA vaccine 2023-2024 (COMIRNATY) syringe Inject into the muscle. 0.3 mL 0   dexlansoprazole (DEXILANT) 60 MG capsule Take 1 capsule by mouth daily 90 capsule 3   Esketamine HCl, 56 MG Dose, (SPRAVATO, 56 MG DOSE,) 28 MG/DEVICE SOPK Place 56 mg into the nose every 3 (three)  days. 2 each 1   Esketamine HCl, 84 MG Dose, (SPRAVATO, 84 MG DOSE,) 28 MG/DEVICE SOPK Place 84 mg into the nose every 3 (three) days. 3 each 5   fesoterodine (TOVIAZ) 8 MG TB24 tablet Take 1 tablet (8 mg total) by mouth daily. 90 tablet 3   glycerin adult 2 g suppository Use 1 suppository as needed if you have not had a bowel movement in  4 to 5 days 25 suppository 0   influenza vaccine adjuvanted (FLUAD QUADRIVALENT) 0.5 ML injection Inject into the muscle. 0.5 mL 0   Krill Oil 500 MG CAPS Take 500 mg by mouth daily.  lactulose (CHRONULAC) 10 GM/15ML solution Take 30 mLs (20 g total) by mouth 3 (three) times daily between meals as needed for mild constipation. (Patient taking differently: Take 20 g by mouth daily as needed for mild constipation.) 473 mL 3   Lavender Oil 80 MG CAPS Take 160 mg by mouth at bedtime.     levothyroxine (SYNTHROID) 75 MCG tablet Take 1 tablet (75 mcg total) by mouth daily. 90 tablet 2   levothyroxine (SYNTHROID) 75 MCG tablet Take 1 tablet (75 mcg total) by mouth daily. 90 tablet 2   linaclotide (LINZESS) 72 MCG capsule Take 2 capsules (144 mcg total) by mouth daily before breakfast. 180 capsule 1   lithium carbonate 300 MG capsule Take 1 capsule (300 mg total) by mouth at bedtime. 90 capsule 0   LORazepam (ATIVAN) 0.5 MG tablet TAKE 2 TABLETS (1 MG)  BY MOUTH EVERY NIGHT AT BEDTIME AND 1 TABLET EVERY 4 HOURS AS NEEDED FOR ANXIETY 270 tablet 0   magic mouthwash w/lidocaine SOLN Take 5 mLs by mouth 3 (three) times daily as needed for mouth pain. 120 mL 0   memantine (NAMENDA) 10 MG tablet Take 1 tablet (10 mg total) by mouth 2 (two) times daily. 180 tablet 1   metoprolol tartrate (LOPRESSOR) 25 MG tablet Take 1 tablet (25 mg total) by mouth as needed. 45 tablet 3   mirtazapine (REMERON) 30 MG tablet Take 1 tablet (30 mg total) by mouth at bedtime. 90 tablet 0   Multiple Vitamins-Minerals (MULTIVITAMIN WITH MINERALS) tablet Take 1 tablet by mouth daily.     Multiple  Vitamins-Minerals (PRESERVISION AREDS 2 PO) Take 1 capsule by mouth in the morning and at bedtime.     naloxegol oxalate (MOVANTIK) 12.5 MG TABS tablet Take 2 tablets (25 mg total) by mouth daily. (Patient taking differently: Take 25 mg by mouth daily. PRN) 60 tablet 3   nortriptyline (PAMELOR) 25 MG capsule Take 2 capsules (50 mg total) by mouth at bedtime. 180 capsule 1   ondansetron (ZOFRAN-ODT) 4 MG disintegrating tablet Place 1 tablet every 6 hours by translingual route as needed. 20 tablet 0   PARoxetine (PAXIL) 20 MG tablet Take 1 tablet (20 mg total) by mouth at bedtime. 90 tablet 0   pneumococcal 20-valent conjugate vaccine (PREVNAR 20) 0.5 ML injection Inject into the muscle. 0.5 mL 0   polyethylene glycol (MIRALAX / GLYCOLAX) 17 g packet Take 17 g by mouth daily as needed for mild constipation.     primidone (MYSOLINE) 50 MG tablet TAKE 2 TABLETS BY MOUTH EVERY MORNING AND 1 TABLET EVERY EVENING 270 tablet 0   Pyridoxine HCl (VITAMIN B-6) 500 MG tablet Take 500 mg by mouth 2 (two) times daily.     RSV vaccine recomb adjuvanted (AREXVY) 120 MCG/0.5ML injection Inject into the muscle. 0.5 mL 0   sennosides-docusate sodium (SENOKOT-S) 8.6-50 MG tablet Take 2 tablets by mouth as needed.     valACYclovir (VALTREX) 1000 MG tablet Take 2 tablets by mouth at onset, then 2 tablets 12 hours later. (Patient taking differently: Take 2,000 mg by mouth every 12 (twelve) hours. PRn) 30 tablet 1   vitamin B-12 (CYANOCOBALAMIN) 1000 MCG tablet Take 1,000 mcg by mouth at bedtime.     No current facility-administered medications for this visit.    Medication Side Effects: Other: tremor  stable.  No worse.  Allergies:  Allergies  Allergen Reactions   Propranolol Other (See Comments)    Low blood pressure. Bradycardia  Brexpiprazole Other (See Comments)    Aphasia and catatonia    Past Medical History:  Diagnosis Date   Anxiety    Arthritis    "hips, spine" (03/17/2018)   Bipolar II disorder  (HCC)    CHF (congestive heart failure) (HCC)    Chronic bronchitis (HCC)    Chronic lower back pain    Chronic right hip pain    CKD (chronic kidney disease), stage II    Coronary artery disease    stent x1   Esophagitis, erosive    GAD (generalized anxiety disorder)    GERD (gastroesophageal reflux disease)    Headache    "maybe monthly" (03/17/2018))   Heart murmur, systolic    History of adenomatous polyp of colon    08-04-2016  tubular adenoma   History of blood transfusion 12/2017   "related to vascular hematoma"   History of electroconvulsive therapy    at [facility]--  started 04-15-2015 to 11-17-2016  total greater than 40 times   History of hiatal hernia    Hyperlipidemia    Hypertension    Hypothyroidism    Internal carotid artery stenosis, bilateral    per last duplex 05-01-2014  bilateral ICA 40-59%   Major depression, chronic    ECT treatments extensive and multiple started 07/ 2016   Memory loss    "both short and long-term; needs frequent reminders to follow instrucitons" (05/16/2017)   Migraines    "none in years" (03/17/2018)   OSA (obstructive sleep apnea)    per study 06/ 2012 moderate OSA  ; "refuses to wear masks" (03/17/2018)   Osteoporosis    Pneumonia 07/29/2022   Poor historian    due to short term memory loss   Presence of permanent cardiac pacemaker 03/17/2018   Pulmonary nodule    monitored by pcp   S/P placement of cardiac pacemaker 03/17/18 [facility]  03/18/2018   Short-term memory loss    Sick sinus syndrome (HCC)     Family History  Problem Relation Age of Onset   Heart attack Father 57       deceased   Hypertension Father    Heart disease Father    Dementia Brother    Parkinson's disease Brother    Heart disease Brother    Breast cancer Paternal Aunt        Age 83's   Breast cancer Paternal Grandmother        Age unknown   Colon cancer Neg Hx     Social History   Socioeconomic History   Marital status: Married    Spouse name: Dr.  Lyla Son   Number of children: 2   Years of education: Not on file   Highest education level: Not on file  Occupational History   Occupation: housewife    Employer: UNEMPLOYED  Tobacco Use   Smoking status: Former    Packs/day: 2.00    Years: 15.00    Total pack years: 30.00    Types: Cigarettes    Quit date: 01/13/1971    Years since quitting: 51.8    Passive exposure: Never   Smokeless tobacco: Never  Vaping Use   Vaping Use: Never used  Substance and Sexual Activity   Alcohol use: Yes    Comment: occassional 1 x a week   Drug use: Never   Sexual activity: Not Currently    Comment: intercourse age 44, sexual partners less than 5  Other Topics Concern   Not on file  Social  History Narrative   Right handed   Social Determinants of Health   Financial Resource Strain: Not on file  Food Insecurity: No Food Insecurity (07/06/2022)   Hunger Vital Sign    Worried About Running Out of Food in the Last Year: Never true    Ran Out of Food in the Last Year: Never true  Transportation Needs: No Transportation Needs (07/06/2022)   PRAPARE - Hydrologist (Medical): No    Lack of Transportation (Non-Medical): No  Physical Activity: Not on file  Stress: Not on file  Social Connections: Not on file  Intimate Partner Violence: Not At Risk (07/06/2022)   Humiliation, Afraid, Rape, and Kick questionnaire    Fear of Current or Ex-Partner: No    Emotionally Abused: No    Physically Abused: No    Sexually Abused: No    Past Medical History, Surgical history, Social history, and Family history were reviewed and updated as appropriate.   Please see review of systems for further details on the patient's review from today.   Objective:   Physical Exam:  LMP  (LMP Unknown)   Physical Exam Constitutional:      General: She is not in acute distress.    Appearance: She is well-developed.  Musculoskeletal:        General: No deformity.  Neurological:      Mental Status: She is alert and oriented to person, place, and time.     Motor: No tremor.     Coordination: Coordination abnormal.     Comments: Cane use  Psychiatric:        Attention and Perception: She is attentive.        Mood and Affect: Mood is anxious and depressed. Affect is not labile, blunt or inappropriate.        Speech: Speech is not slurred.        Behavior: Behavior normal. Behavior is not slowed or withdrawn.        Thought Content: Thought content normal. Thought content is not delusional. Thought content does not include homicidal or suicidal ideation. Thought content does not include suicidal plan.        Cognition and Memory: She exhibits impaired recent memory.        Judgment: Judgment normal.     Comments: Insight fair to good. No auditory or visual hallucinations.  Depression and anxiety are worse lately Gait ok. Memory appears stable to maybe a little worse.     Lab Review:     Component Value Date/Time   NA 137 11/11/2022 1106   NA 136 04/20/2017 1151   K 4.6 11/11/2022 1106   CL 106 11/11/2022 1106   CO2 23 11/11/2022 1106   GLUCOSE 97 11/11/2022 1106   BUN 24 11/11/2022 1106   BUN 24 04/20/2017 1151   CREATININE 0.73 11/11/2022 1106   CALCIUM 9.4 11/11/2022 1106   PROT 6.9 11/11/2022 1106   PROT 6.2 04/20/2017 1151   ALBUMIN 3.9 09/02/2022 1221   ALBUMIN 3.9 04/20/2017 1151   AST 44 (H) 11/11/2022 1106   ALT 86 (H) 11/11/2022 1106   ALKPHOS 64 09/02/2022 1221   BILITOT 0.2 11/11/2022 1106   BILITOT <0.2 04/20/2017 1151   GFRNONAA >60 07/08/2022 0429   GFRNONAA 50 (L) 07/10/2020 1021   GFRAA 58 (L) 07/10/2020 1021       Component Value Date/Time   WBC 6.6 11/11/2022 1106   RBC 3.88 11/11/2022 1106   HGB 12.1 11/11/2022  1106   HGB 10.2 (L) 04/20/2017 1151   HCT 36.3 11/11/2022 1106   HCT 29.6 (L) 04/20/2017 1151   PLT 215 11/11/2022 1106   PLT 385 (H) 04/20/2017 1151   MCV 93.6 11/11/2022 1106   MCV 89 04/20/2017 1151   MCH 31.2  11/11/2022 1106   MCHC 33.3 11/11/2022 1106   RDW 13.7 11/11/2022 1106   RDW 13.9 04/20/2017 1151   LYMPHSABS 1,439 11/11/2022 1106   LYMPHSABS 1.5 04/20/2017 1151   MONOABS 0.7 07/07/2022 0458   EOSABS 112 11/11/2022 1106   EOSABS 0.3 04/20/2017 1151   BASOSABS 53 11/11/2022 1106   BASOSABS 0.0 04/20/2017 1151    Lithium Lvl  Date Value Ref Range Status  10/21/2022 0.5 (L) 0.6 - 1.2 mmol/L Final  10/21/2022 lithium level 0.5 on 300 mg nightly is stable.,  Nortriptyline 115 on 75 mg HS.  08/02/22 nortriptyline 85, lihtium 0.5  Nortriptyline level 51 on 50 mg nightly with paroxetine 20 mg daily  07/10/2020 lithium level 0.6 at [facility] office and nortriptyline level 130 on the current dosages of lithium 300 mg nightly and nortriptyline 75 mg nightly  02/20/2021 labs lithium 0.6 on 300 mg nightly.  Nortriptyline level 49 which is lower than expected on 75 mg nightly along with paroxetine 20 mg daily.    No results found for: "PHENYTOIN", "PHENOBARB", "VALPROATE", "CBMZ"   .res Assessment: Plan:    Severe bipolar I disorder, current or most recent episode depressed, with catatonia (HCC)  Generalized anxiety disorder  Mild cognitive impairment  Lithium use  Lithium-induced tremor    History of lithium toxicity repeatedly.  Hx TX resistant bipolar depression with catatonia.  Recent decompensation and rapidly getting worse with some cognive impairment and mild psychotic sx and altered mental status.Depression and anxiety much worse in the last several weeks.  Her case has been highly complex and treatment resistant at times. Disc pros and cons of return to ECT.  She is strongly opposed at this time.   Dialogue with Dr. Aundra Dubin cardiologist agreed pt should be stable enough from CV perspective to receive Spravato which could elevate BP.  If that occurs use prn metoprolol 25 mg. Disc in detail with Dr. Velora Heckler and he agrees Ebony Cargo is appropriate though not FDA approved for  bipolar depression.  Disc 2 metanalysis in support of this and this alernative is safer and likely better tolerated than the option of ECT and faster than options of trials of Trintellix, Auvelity, Latuda etc.  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  During the severe depression she has catatonia.  She had required longterm ECT.  A previous episode of depression approximately 10 years ago stayed in remission on Paxil and nortriptyline and lithium for a period of years until the lithium level was decreased due to tremor.  Now we are maintaining an adequate lithium level.  She has had several episodes of lithium toxicity with hospitalization.  This resolved once she stopped chlorthalidone.  From records it appears her last ECT was in September 2020.    Patient was administered Spravato 56 mg intranasally today.  The patient experienced the typical dissociation which gradually resolved over the 2-hour period of observation.  There were no complications.  Specifically the patient did not have nausea or vomiting or headache.  Blood pressures did go up at 40-minute and 2-hour follow-up intervals.  By the time the 2-hour observation period was met the patient was alert and oriented and  able to exit without assistance.  Patient feels the Spravato administration is helpful for the treatment resistant depression and would like to continue the treatment.  See nursing note for further details. She received the metoprolol 25 mg prn  Will review the old chart and consider augmentation with very low-dose Vraylar or perhaps pramipexole  She seems to be tolerating the meds well other than the lithium tremor which is being fairly  managed with primidone.  She is continuing under the care of her neurologist Dr. Carles Collet.  The nortriptyline was increased to 75 to try to reduce tendency to depression in February 2020.  Need repeat level. 08/02/22 nortriptyline 85, lihtium  0.5  Received 10/26/22= 115.  Reduced nortriptyline to 50 mg HS to protect from SE including jitteriness and reduced appetite and cognitive dysfunction and increase mirtazapine instead for mood, anxiety, appetite, and sleep  Lithium level goals are between 0.5 and 0.7.  She has a history of lithium toxicity when she was taking chlorthalidone with the lithium.   Discussed signs and symptoms of lithium toxicity.  Discussed the risk of increasing depression if the lithium level gets too low.  She cannot tolerate higher doses of lithium.  There remains some concern about drug interactions between paroxetine and nortriptyline and given her age we do not want the nortriptyline levels higher than necessary .  Disc risk polypharmacy  Continue Lithium 300 mg daily  Continue nortriptyline 50 mg daily Continue paroxetine 20 mg daily Continue lorazepam 0.5 mg 3 times daily for both anxiety and catatonia Continue vitamin B6 and primidone for tremor Increase  mirtazapine 30 mg HS for appetite,  used for sleep and depression.  Appetite better  Lynder Parents MD, DFAPA Please see After Visit Summary for patient specific instructions.  Future Appointments  Date Time Provider [place]  11/26/2022 10:00 AM Cottle, Billey Co., MD CP-CP None  11/26/2022 10:00 AM CP-NURSE CP-CP None  11/29/2022  2:00 PM [facility] 1 [facility]  12/02/2022 10:00 AM Shanon Ace, LCSW CP-CP None  12/03/2022 11:00 AM Mauri Pole, MD [facility]  01/03/2023  3:20 PM [facility] DEVICE REMOTES [facility]  04/04/2023  3:20 PM [facility] DEVICE REMOTES [facility]  07/04/2023  3:20 PM [facility] DEVICE REMOTES [facility]  10/03/2023  7:00 AM [facility] DEVICE REMOTES [facility]      No orders of the defined types were placed in this encounter.      -------------------------------

## 2022-11-25 ENCOUNTER — Encounter (HOSPITAL_BASED_OUTPATIENT_CLINIC_OR_DEPARTMENT_OTHER): Payer: Self-pay | Admitting: Pharmacist

## 2022-11-25 ENCOUNTER — Other Ambulatory Visit (HOSPITAL_BASED_OUTPATIENT_CLINIC_OR_DEPARTMENT_OTHER): Payer: Self-pay

## 2022-11-26 ENCOUNTER — Other Ambulatory Visit (HOSPITAL_BASED_OUTPATIENT_CLINIC_OR_DEPARTMENT_OTHER): Payer: Self-pay

## 2022-11-26 ENCOUNTER — Ambulatory Visit (INDEPENDENT_AMBULATORY_CARE_PROVIDER_SITE_OTHER): Payer: Self-pay | Admitting: Psychiatry

## 2022-11-26 ENCOUNTER — Ambulatory Visit: Payer: Medicare Other

## 2022-11-26 VITALS — BP 156/64 | HR 70

## 2022-11-26 DIAGNOSIS — R3589 Other polyuria: Secondary | ICD-10-CM

## 2022-11-26 DIAGNOSIS — F411 Generalized anxiety disorder: Secondary | ICD-10-CM

## 2022-11-26 DIAGNOSIS — G3184 Mild cognitive impairment, so stated: Secondary | ICD-10-CM

## 2022-11-26 DIAGNOSIS — F314 Bipolar disorder, current episode depressed, severe, without psychotic features: Secondary | ICD-10-CM

## 2022-11-26 DIAGNOSIS — F061 Catatonic disorder due to known physiological condition: Secondary | ICD-10-CM

## 2022-11-26 DIAGNOSIS — G251 Drug-induced tremor: Secondary | ICD-10-CM

## 2022-11-26 DIAGNOSIS — Z79899 Other long term (current) drug therapy: Secondary | ICD-10-CM

## 2022-11-26 NOTE — Progress Notes (Addendum)
Meghan Oliver [id number] 03/08/1941 82 y.o.  Subjective:   Patient ID:  Meghan Oliver is a 82 y.o. (DOB 05/25/1941) female. Patient was last seen August 21, 2018 Chief Complaint:  Chief Complaint  Patient presents with   Follow-up   Depression   Anxiety   Fatigue     Meghan Oliver presents to the office today for follow-up of Severe TR bipolar depression with psychotic features including catatonia.  11/30/2018 was the last visit and the following was noted: Had ECT yesterday at [facility].  Last was Jan 8 and was doing well then.  Next scheduled April 15.  Had a lithium level from there which is pending. Pretty good until the last 10 days to 2 weeks with less energy and motivation.  Bothered by old sick dog.  Worried about how that will affect her.  Has slept with the dog.  [place]ed [place].  Going to gym and playing [place].  Took the class and didn't feel she caught on quickly. Wonders if it is bc of the ECT memory effects.  Bridge is harder and not playing.  Planning to return to [facility] mid April.   spent the winter in [place] per usual..  No significant depression since here. ECT frequency about 6 weeks.  Gets very anxious the day of the ECT.  ECT consistently for a year.  Best in mood in 7-8 years.  Also the pacemaker helped.  Friends notice the benefit.  Plan:  Option increase in the nortriptyline if needed.  Considered this.  They agree and would like to increase nortriptyline to 75 to try to reduce tendency to depression before the ECT.   03/19/19  Addendum: Here the contents from an email from the nurse for Ms. Damiani  Hello [name],   I hope you and your family are healthy and well. I took Trechelle to [facility] for ECT and here are labs from 01/26/19. I am going to also forward them to [facility] for follow up with her LFT's. They are still higher than I would like.  Her lithium level was 0.51 on a daily dose of 300mg  qohs x 4 nights- alternating with 450mg  qohs x 3 nights. She is not  demonstrating any signs of elevated levels, minimal hand tremors, less anxiety and restlessness since her ECT treatment.  Her Nortryptyline level was 140 on 75mg  qhs.  I would like to continue her current dosages of both the above meds and recheck her lithium level at her next scheduled ECT of 03/23/19.  Please let me know of any changes you would like to make. Take care, Meghan Oliver Lithium level was 0.4 on the 450mg  alt with 300 mg QOD. No changes were made in meds.  11/15/2019 phone call: Telephone call from patient's husband [name] on 11/14/2019  Patient is scheduled for ECT soon for maintenance ECT.  He thinks it is been 2-1/2 months since the last ECT but he is going to check the date for sure.  It is still be done being done at [facility].  He is wondering about skipping this treatment because the patient has continued to be free of depression and seems cognitively clearer as these ECT treatments have been spread out further from each other.  Patient remains on medications as prescribed.  If it is truly been over 2 months since the last ECT then it is reasonable to consider discontinuing ECT.  It is unlikely that ECT with the frequency of greater than  2 months is significantly helpful at preventing her relapse.  If however the ECT frequency is less than every 2 months it could still be helping to prevent recurrence.  He indicated he would consider this information and discuss it with her ECT team and make a decision.  They are heading to [place] in the next few weeks.  Needs to schedule an appointment with me for follow-up because it is been many months.  He agrees.   02/19/2020 appt, the following noted: Moving to [facility] in a few months.  Ready to downsize.   Stopped ECT as discussed and has not been more deprressed.   Walks dog 4 times daily.   Lithium 0.7 on  01/30/20 on lithium 300 mg plus 150 mg on M, W, F Nortriptyline 70 on 75 mg HS. No SE except tremor.  Balance is not  great.  Very happy.   No depression since here.  No mood swings.  Sleep and appetite is OK.  No med changes:  04/03/2020 TC with the following noted: Patient's husband called stating that her tremor was a little worse and she was having some stutter. No evidence of stroke otherwise. First thing would be to check serum lithium level and BMP.  Order written.  Have asked her husband to let us know about the lab to which it should be sent.  04/22/20 TC witht he following noted: Lithium level is not dangerously high but it is has crept up to 1.0 which is higher than desired for her.  Our goal for her is 0.5-0.7.  At the current level it is likely causing side effects so we should reduce the dosage from lithium 300 mg plus 150 mg on M, W, F  TO 300 mg daily.  Meaning drop off the 150 mg capsule.  It will take about 2 weeks for the level to gradually come down to the desired level.  05/08/2020 appt with the following noted: Seen with H and [name] nurse. Moving to [facility] and feels OK about it.  I think I'll be fine.   Pretty good with balance.  Doing yoga and it helps. [name] has cancer. Old dog 24 & 1/2 yo still living. [name]'s concerns when went to [place] for D's BD she had difficulty time with tremor lethargy, more confusion.  Better when go back home. [name] notes using more lorazepam. At times gait is unsteady. Not significantly depressed.  Can be anxious at times.  Eating okay.  Sleeping okay.  Is easily confused under stress. Plan: reduce lithium to 300 mg nightly.  07/14/20 appt with the following noted: Seen with her husband as well as her family nurse. Feeling fine and pleased with that.   Some anxiety over the move to smaller place. [name]'s twin also having heart problems Gene. [name]'s kids came and visited.    Plan: Continue nortriptyline 75 mg, paroxetine 20 mg, lithium 300 mg, Namenda 10 mg twice daily, lorazepam 0.5 mg 1/2-1 3 times daily as needed, mirtazapine 7.5 mg  nightly  02/27/2021 appointment with the following noted:  Seen with H and nurse [name] Received email from nurse Meghan Loft on 02/24/2021 indicated that he had moved into [facility] living in May.  Renlee has gradually become more depressed and more anxious using lorazepam as prescribed 3 times daily.  More memory issues.  She has remained engaged and initiating appropriate conversation.  She is very worried about her 34 year old dog who is in poor health and fears she will  become more depressed when the dog dies.  She and her husband do not wish to prefer to pursue further ECT unless absolutely necessary. Likes [facility] so far.   No depression by her report.  Sold big house in Feb.  It worked out well.   Worries about her dog 73 yo will die soon. H agrees she's done well with depression.   Enjoys things and has interests. Plan: Continue Lithium 300 mg daily  Continue nortriptyline 75 mg daily Continue paroxetine 20 mg daily Continue lorazepam 0.5 mg 3 times daily for both anxiety and catatonia Continue vitamin B6 and primidone for tremor Cerefolin NAC 2 daily.  05/13/2021 appointment with the following noted: Happy with [facility].  H and she agree is doing well with depression.Per[place] socialization. She complains of memory.   Family visited and getting along with Dondra Spry better.  H says Dondra Spry is acting better.  Goes to dinner and activities together now.  Better relationship with Gene.. Tremor has been OK.   Ativan usually 0.25 mg TID and tolerated. Anxiety managed with this generally. Molly dog health more stable. To [place] before T'giving until April.   Plan: Continue Lithium 300 mg daily  Continue nortriptyline 75 mg daily Continue paroxetine 20 mg daily Continue lorazepam 0.5 mg 3 times daily for both anxiety and catatonia Continue vitamin B6 and primidone for tremor   08/14/2021 appointment with the following noted: Seen with her husband [name] and nurse [name]. Everyone reports that  her mood has been stable.  Her anxiety is manageable with as needed lorazepam.  She is still happy with the transition to [facility].  They are preparing to go to [place] for the winter per usual but may sell their house father there. Tolerating meds without any unusual side effects. Is dealing with grief over the loss of her dog Molly.  03/10/2022 appt noted:  seen with H and nurse Oleta [facility] house. [name] not good phsyically hard to play golf.  Been really hard. He'll be 85 in July.  Some stress scheduling with D's.   60 th wedding necessary this month.   Mood up and down.  Sister in law across the street can be difficult and demeaning.  She feels unsettled.  Some worry. Some back pain limiting activity.   Sleep is ok except with pain awakening.   No SE, except tremor some worse about 3-5 and helped by lorazepam 0.25 mg then No greater than 1 mg lorazepam daily Patient denies any recent difficulty with anxiety except as noted.  Patient denies difficulty with sleep initiation or maintenance. Denies appetite disturbance.    Patient has some difficulty with concentration.  Patient denies any suicidal ideation.  Usually takes Ativan at night and sleeps well 8 hours.   Plan no med changes  10/23/213 appt noted: Still doing well. Pneumonia and hosp for 6 days 3 weeks ago.  Viral.  Improving at this time. Increased tremor gradually in 3-4 mos helped by lorazepam avg 1.00-1.25 mg daily. No change in primidone. Total R hip August 2023 and done well.  Finishing home PT.   Appetite poor for 7-8 mos.  2 protein drinks daily but may not eat more than 1 good meal a day. [facility]. Substantial memory problems in hospital and in unusual enviornments. Nurse notices decline.  B with LBD passed in August. No pain meds or muscle relaxants beyond pain. No depression.  H note sometimes she has less motivation than others. [name] notes some normal waxing and  waning of mood. Good social interaction.   Not  spending winter in [facility] this year.  D still lives there. Plan: Increase mirtazapine 15 mg HS for appetite.  Also used for sleep and depression.  09/23/22 appt noted:  with nurse and [name] [name] feeling bad with more pain and fatigue.  [name] has CHF.   Made Amarise sad.  Lost a cgood friends.  Rough time.  Mostly worry about [name].  She feels afraid to be alone if something happens.   Not sig depressed.    Just worry . H can't be as active.  Taking a shower is hard.   She's back exercising again.   Went to [employer] for Thanksgiving in [facility] and plan to go back for Christmas. It's hard to travel. Told nurse she was depressed. Appetite is better and once asleep is OK. Using more lorazepam for anxiety 3-4 daily. Plan: Continue Lithium 300 mg daily  Continue nortriptyline 75 mg daily Continue paroxetine 20 mg daily Continue lorazepam 0.5 mg 3 times daily for both anxiety and catatonia Continue vitamin B6 and primidone for tremor Increased and it helped mirtazapine 15 mg HS for appetite.  Also used for sleep and depression.  10/19/22  RTC  [name] reports not doing well.  Went to [facility].  Recent pneumonia.  They are thinking this is aspiration pneurmonia. Lethargic and anxious with tremor. Ruminating.  Hot to cold .  Low tolerance.  Going downhill quickly.  She's verbalizing depression and desire to isolate.  Won't go out.  Started within the last 2 weeks. Usually doesn't complain when she's sick.   [name]'s biggest concern is that Doreatha Martin is having trouble handling this bc she's up at night rambling and not really safe to be up at night.    Will see her 10/22/22 Get lithium and nortriptyline levels.   Meredith Staggers, MD, [facility]     10/22/22 emergency appt with nurse [name] and H [name]: Pneumonia Oct and then again 2nd week January.  It appears to have largely resoved.  It's really bad.  Nausea. Anxious bc [name] ill.  Doesn't want him to leave.  [name] sees anxiety triggering anxiety with little to no appetite.  Hypersensitive to  stimuli like light and noises.  Doesn't want TV on.  [name] says totally reversed in last 6 weeks.    [name] reports she's overemotional.  Friends died. Losing wt 91.4# today. More trouble with sleep. Lorazepam increased to 2 mg daily.  Not sleepy with it.  Doesn't seem to be helpful. Last week neglect hygiene but better this week a little. [name] says STM is poor at times and at times disoriented if in [place].  Will walk in bathroom and wonder where sh is. Barium swallow pending for evaluation of refulex.    10/26/22 RTC:  RTC   No improvement since Friday.  Gradually less engaged.  Can be overstimulated with visitors.   Disc nortriptyline level 115 is a little higher than it was and higher levels can cause SE in elderly.   Reduce nortriptyline to 50 mg HS. Taking lorazepam    Consider low dose of Vraylar or pramipexole.  Or Spravato.     Meredith Staggers, MD, [facility]AP[facility]     11/15/22 appt noted: Dialogue with Dr. Shirlee Latch cardiologist agreed pt should be stable enough from CV perspective to receive Spravato which could elevate BP.  If that occurs use prn metoprolol 25 mg. Disc in detail with Dr. Corinda Gubler and he agrees [place] is appropriate though not  FDA approved for bipolar depression.  Disc 2 metanalysis in support of this and this alernative is safer and likely better tolerated than the option of ECT and faster than options of trials of Trintellix, Auvelity, Latuda etc. Tolerating meds.  She is depressed and very anxious and intermittently confused.  Taking clothes on and off bc rapid changes in comfort.  Worried over [employer].  Sleep disrupted appetite some better with mirtazapine. Received Spravato 56 mg first time today and was dissociated.  Resolved over 2 hours.  No HA, NV.  Was distressed moderately and very ambivalent about Tx.  H and nurse supportive of tx plan and for the pt.    11/24/22 appt noted: Current meds: nortriptyline 50 mg HS, fluoxetine 20 mg daily, lithium 300 mg nightly, mirtazapine  30 mg nightly, memantine 10 mg twice daily.  Lorazepam 0.5 mg every 4 to 6 hours as needed catatonia or severe anxiety. Received first dose of Spravato 84 mg today.  She was more noticeably dissociated than with the 56 mg dose.  It did resolve over the 2-hour course of observation although she remained somewhat unsteady and needed to leave via wheelchair.  She did not have headache nausea or vomiting.  She remains ambivalent about the treatment. Husband notes she has continued to be anxious and depressed and was extremely anxious yesterday and somewhat confused.  At times would say things that did not make sense.  He remained supportive of the treatment while she remains ambivalent about the treatment.  Her appetite is good and she is eating well.  Sleep is variable.  H reported at times will be almost normal after receiving an extra dose of lorazepam 0.5 mg prn but this only lasts an hour or so. Plan: Continue Lithium 300 mg daily  Continue nortriptyline 50 mg daily Continue paroxetine 20 mg daily Continue vitamin B6 and primidone for tremor Imirtazapine 30 mg HS for appetite,  used for sleep and depression.  Appetite better Switch lorazepam to [facility] 2 mg AM and use lorazepam 0.5 mg prn.  For catatonia in hopes of more consistent relief.  Decision based on observation of periods of almost normalcy after some BZ doses.  We discussed potential switch of paroxetine and nortriptyline to Auvelity or Trintellix or possibly Jordan.  However these switches will be difficult and it is hoped that the Spravato may provide enough improvement to allow a switch in medicine more tolerable.  11/26/22 appt noted: Current meds: nortriptyline 50 mg HS, fluoxetine 20 mg daily, lithium 300 mg nightly, mirtazapine 30 mg nightly, memantine 10 mg twice daily.  Lorazepam 0.5 mg every 4 to 6 hours as needed catatonia or severe anxiety.  Received first dose of Spravato 84 mg today.   Spravato with expected dissociation.  She  didn't experience relief from negative emotion.  Dissociation resolved over observation period.  No N, V, HA.   Still high anxiety and dread of most everything.  Depressed with negative thought predominates.  H notes periods of confusion and then maybe an hour of clarity sometimes after the dosing of lorazepam.  Then back to severe sx.  Eating is fair. No SI.  Fearful generally.  Needs help with ADLs.  Cannot take meds on her own. Tolerating meds without SE  No current alcohol problems  Saw Dr. Arbutus Leas last week and no change in neuro meds.  Past Psychiatric Medication Trials: Prozac with lithium and Zyprexa 2.5 to 5 mg,  nortriptyline, paroxetine, ,  pramipexole, mirtazapine,  Rexulti, Abilify, Vraylar, Latuda 10 mg,  lamotrigine.   Namenda,  lorazepam,   this loss list is not exhaustive  Review of Systems:  Review of Systems  Constitutional:  Positive for fatigue.  Respiratory:  Negative for shortness of breath.   Cardiovascular:  Negative for palpitations.  Gastrointestinal:  Positive for constipation and nausea.       Linzess and Mozantic managed   Musculoskeletal:  Positive for gait problem.  Neurological:  Positive for dizziness. Negative for tremors and weakness.       Some balance issues  Psychiatric/Behavioral:  Positive for confusion, decreased concentration and dysphoric mood. Negative for agitation, behavioral problems, hallucinations, self-injury, sleep disturbance and suicidal ideas. The patient is nervous/anxious. The patient is not hyperactive.     Medications: I have reviewed the patient's current medications.  Current Outpatient Medications  Medication Sig Dispense Refill   aspirin 81 MG chewable tablet Chew 1 tablet (81 mg total) by mouth daily.     Calcium Carbonate-Vitamin D (CALCIUM 600/VITAMIN D PO) Take 1 tablet by mouth 2 (two) times daily.     cetirizine (ZYRTEC) 10 MG tablet Take 10 mg by mouth daily as needed for allergies.     Cholecalciferol (VITAMIN  D3) 50 MCG (2000 UT) TABS Take 2,000 Units by mouth in the morning.     COVID-19 mRNA vaccine 2023-2024 (COMIRNATY) syringe Inject into the muscle. 0.3 mL 0   dexlansoprazole (DEXILANT) 60 MG capsule Take 1 capsule by mouth daily 90 capsule 3   Esketamine HCl, 84 MG Dose, (SPRAVATO, 84 MG DOSE,) 28 MG/DEVICE SOPK Place 84 mg into the nose every 3 (three) days. 3 each 5   fesoterodine (TOVIAZ) 8 MG TB24 tablet Take 1 tablet (8 mg total) by mouth daily. 90 tablet 3   glycerin adult 2 g suppository Use 1 suppository as needed if you have not had a bowel movement in  4 to 5 days 25 suppository 0   influenza vaccine adjuvanted (FLUAD QUADRIVALENT) 0.5 ML injection Inject into the muscle. 0.5 mL 0   Krill Oil 500 MG CAPS Take 500 mg by mouth daily.     lactulose (CHRONULAC) 10 GM/15ML solution Take 30 mLs (20 g total) by mouth 3 (three) times daily between meals as needed for mild constipation. (Patient taking differently: Take 20 g by mouth daily as needed for mild constipation.) 473 mL 3   Lavender Oil 80 MG CAPS Take 160 mg by mouth at bedtime.     levothyroxine (SYNTHROID) 75 MCG tablet Take 1 tablet (75 mcg total) by mouth daily. 90 tablet 2   levothyroxine (SYNTHROID) 75 MCG tablet Take 1 tablet (75 mcg total) by mouth daily. 90 tablet 2   linaclotide (LINZESS) 72 MCG capsule Take 2 capsules (144 mcg total) by mouth daily before breakfast. 180 capsule 1   lithium carbonate 300 MG capsule Take 1 capsule (300 mg total) by mouth at bedtime. 90 capsule 0   LORazepam (ATIVAN) 0.5 MG tablet TAKE 2 TABLETS (1 MG)  BY MOUTH EVERY NIGHT AT BEDTIME AND 1 TABLET EVERY 4 HOURS AS NEEDED FOR ANXIETY (Patient taking differently: Take 0.5 mg by mouth every 6 (six) hours as needed. TAKE 2 TABLETS (1 MG)  BY MOUTH EVERY NIGHT AT BEDTIME AND 1 TABLET EVERY 4 HOURS AS NEEDED FOR ANXIETY) 270 tablet 0   magic mouthwash w/lidocaine SOLN Take 5 mLs by mouth 3 (three) times daily as needed for mouth pain. 120 mL 0    memantine (NAMENDA)  10 MG tablet Take 1 tablet (10 mg total) by mouth 2 (two) times daily. 180 tablet 1   metoprolol tartrate (LOPRESSOR) 25 MG tablet Take 1 tablet (25 mg total) by mouth as needed. 45 tablet 3   Multiple Vitamins-Minerals (MULTIVITAMIN WITH MINERALS) tablet Take 1 tablet by mouth daily.     Multiple Vitamins-Minerals (PRESERVISION AREDS 2 PO) Take 1 capsule by mouth in the morning and at bedtime.     naloxegol oxalate (MOVANTIK) 12.5 MG TABS tablet Take 2 tablets (25 mg total) by mouth daily. (Patient taking differently: Take 25 mg by mouth daily. PRN) 60 tablet 3   nortriptyline (PAMELOR) 25 MG capsule Take 2 capsules (50 mg total) by mouth at bedtime. 180 capsule 1   ondansetron (ZOFRAN-ODT) 4 MG disintegrating tablet Place 1 tablet every 6 hours by translingual route as needed. 20 tablet 0   PARoxetine (PAXIL) 20 MG tablet Take 1 tablet (20 mg total) by mouth at bedtime. 90 tablet 0   pneumococcal 20-valent conjugate vaccine (PREVNAR 20) 0.5 ML injection Inject into the muscle. 0.5 mL 0   polyethylene glycol (MIRALAX / GLYCOLAX) 17 g packet Take 17 g by mouth daily as needed for mild constipation.     primidone (MYSOLINE) 50 MG tablet TAKE 2 TABLETS BY MOUTH EVERY MORNING AND 1 TABLET EVERY EVENING 270 tablet 0   Pyridoxine HCl (VITAMIN B-6) 500 MG tablet Take 500 mg by mouth 2 (two) times daily.     RSV vaccine recomb adjuvanted (AREXVY) 120 MCG/0.5ML injection Inject into the muscle. 0.5 mL 0   sennosides-docusate sodium (SENOKOT-S) 8.6-50 MG tablet Take 2 tablets by mouth as needed.     valACYclovir (VALTREX) 1000 MG tablet Take 2 tablets by mouth at onset, then 2 tablets 12 hours later. (Patient taking differently: Take 2,000 mg by mouth every 12 (twelve) hours. PRn) 30 tablet 1   vitamin B-12 (CYANOCOBALAMIN) 1000 MCG tablet Take 1,000 mcg by mouth at bedtime.     amoxicillin (AMOXIL) 500 MG capsule Take 3 capsules (1,500 mg total) by mouth 1 hour prior to dental work. 9  capsule 1   LORazepam ER (LOREEV XR) 2 MG CS24 Take 1 capsule by mouth every morning. 7 capsule 0   OLANZapine (ZYPREXA) 2.5 MG tablet Take 1 tablet (2.5 mg total) by mouth at bedtime. 30 tablet 0   No current facility-administered medications for this visit.    Medication Side Effects: Other: tremor  stable.  No worse.  Allergies:  Allergies  Allergen Reactions   Propranolol Other (See Comments)    Low blood pressure. Bradycardia    Brexpiprazole Other (See Comments)    Aphasia and catatonia    Past Medical History:  Diagnosis Date   Anxiety    Arthritis    "hips, spine" (03/17/2018)   Bipolar II disorder (HCC)    CHF (congestive heart failure) (HCC)    Chronic bronchitis (HCC)    Chronic lower back pain    Chronic right hip pain    CKD (chronic kidney disease), stage II    Coronary artery disease    stent x1   Esophagitis, erosive    GAD (generalized anxiety disorder)    GERD (gastroesophageal reflux disease)    Headache    "maybe monthly" (03/17/2018))   Heart murmur, systolic    History of adenomatous polyp of colon    08-04-2016  tubular adenoma   History of blood transfusion 12/2017   "related to vascular hematoma"   History of  electroconvulsive therapy    at [facility]--  started 04-15-2015 to 11-17-2016  total greater than 40 times   History of hiatal hernia    Hyperlipidemia    Hypertension    Hypothyroidism    Internal carotid artery stenosis, bilateral    per last duplex 05-01-2014  bilateral ICA 40-59%   Major depression, chronic    ECT treatments extensive and multiple started 07/ 2016   Memory loss    "both short and long-term; needs frequent reminders to follow instrucitons" (05/16/2017)   Migraines    "none in years" (03/17/2018)   OSA (obstructive sleep apnea)    per study 06/ 2012 moderate OSA  ; "refuses to wear masks" (03/17/2018)   Osteoporosis    Pneumonia 07/29/2022   Poor historian    due to short term memory loss   Presence of permanent  cardiac pacemaker 03/17/2018   Pulmonary nodule    monitored by pcp   S/P placement of cardiac pacemaker 03/17/18 [facility]  03/18/2018   Short-term memory loss    Sick sinus syndrome (HCC)     Family History  Problem Relation Age of Onset   Heart attack Father 56       deceased   Hypertension Father    Heart disease Father    Dementia Brother    Parkinson's disease Brother    Heart disease Brother    Breast cancer Paternal Aunt        Age 51's   Breast cancer Paternal Grandmother        Age unknown   Colon cancer Neg Hx     Social History   Socioeconomic History   Marital status: Married    Spouse name: Dr. Victorino Dike   Number of children: 2   Years of education: Not on file   Highest education level: Not on file  Occupational History   Occupation: housewife    Employer: UNEMPLOYED  Tobacco Use   Smoking status: Former    Packs/day: 2.00    Years: 15.00    Total pack years: 30.00    Types: Cigarettes    Quit date: 01/13/1971    Years since quitting: 51.9    Passive exposure: Never   Smokeless tobacco: Never  Vaping Use   Vaping Use: Never used  Substance and Sexual Activity   Alcohol use: Yes    Comment: occassional 1 x a week   Drug use: Never   Sexual activity: Not Currently    Comment: intercourse age 19, sexual partners less than 5  Other Topics Concern   Not on file  Social History Narrative   Right handed   Social Determinants of Health   Financial Resource Strain: Not on file  Food Insecurity: No Food Insecurity (07/06/2022)   Hunger Vital Sign    Worried About Running Out of Food in the Last Year: Never true    Ran Out of Food in the Last Year: Never true  Transportation Needs: No Transportation Needs (07/06/2022)   PRAPARE - Administrator, Civil Service (Medical): No    Lack of Transportation (Non-Medical): No  Physical Activity: Not on file  Stress: Not on file  Social Connections: Not on file  Intimate Partner Violence: Not At  Risk (07/06/2022)   Humiliation, Afraid, Rape, and Kick questionnaire    Fear of Current or Ex-Partner: No    Emotionally Abused: No    Physically Abused: No    Sexually Abused: No    Past  Medical History, Surgical history, Social history, and Family history were reviewed and updated as appropriate.   Please see review of systems for further details on the patient's review from today.   Objective:   Physical Exam:  LMP  (LMP Unknown)   Physical Exam Constitutional:      General: She is not in acute distress.    Appearance: She is well-developed.  Musculoskeletal:        General: No deformity.  Neurological:     Mental Status: She is alert and oriented to person, place, and time.     Motor: No tremor.     Coordination: Coordination abnormal.     Comments: Cane use  Psychiatric:        Attention and Perception: She is attentive.        Mood and Affect: Mood is anxious and depressed. Affect is not labile, blunt, tearful or inappropriate.        Speech: Speech is not slurred.        Behavior: Behavior normal. Behavior is not slowed or withdrawn.        Thought Content: Thought content normal. Thought content is not delusional. Thought content does not include homicidal or suicidal ideation. Thought content does not include suicidal plan.        Cognition and Memory: She exhibits impaired recent memory.        Judgment: Judgment normal.     Comments: Insight fair to good. No auditory or visual hallucinations.  Depression and anxiety are worse lately without change from Spravato. A bit unsteady with gait. Memory worse.     Lab Review:     Component Value Date/Time   NA 137 11/11/2022 1106   NA 136 04/20/2017 1151   K 4.6 11/11/2022 1106   CL 106 11/11/2022 1106   CO2 23 11/11/2022 1106   GLUCOSE 97 11/11/2022 1106   BUN 24 11/11/2022 1106   BUN 24 04/20/2017 1151   CREATININE 0.73 11/11/2022 1106   CALCIUM 9.4 11/11/2022 1106   PROT 6.9 11/11/2022 1106   PROT 6.2  04/20/2017 1151   ALBUMIN 3.9 09/02/2022 1221   ALBUMIN 3.9 04/20/2017 1151   AST 44 (H) 11/11/2022 1106   ALT 86 (H) 11/11/2022 1106   ALKPHOS 64 09/02/2022 1221   BILITOT 0.2 11/11/2022 1106   BILITOT <0.2 04/20/2017 1151   GFRNONAA >60 07/08/2022 0429   GFRNONAA 50 (L) 07/10/2020 1021   GFRAA 58 (L) 07/10/2020 1021       Component Value Date/Time   WBC 6.6 11/11/2022 1106   RBC 3.88 11/11/2022 1106   HGB 12.1 11/11/2022 1106   HGB 10.2 (L) 04/20/2017 1151   HCT 36.3 11/11/2022 1106   HCT 29.6 (L) 04/20/2017 1151   PLT 215 11/11/2022 1106   PLT 385 (H) 04/20/2017 1151   MCV 93.6 11/11/2022 1106   MCV 89 04/20/2017 1151   MCH 31.2 11/11/2022 1106   MCHC 33.3 11/11/2022 1106   RDW 13.7 11/11/2022 1106   RDW 13.9 04/20/2017 1151   LYMPHSABS 1,439 11/11/2022 1106   LYMPHSABS 1.5 04/20/2017 1151   MONOABS 0.7 07/07/2022 0458   EOSABS 112 11/11/2022 1106   EOSABS 0.3 04/20/2017 1151   BASOSABS 53 11/11/2022 1106   BASOSABS 0.0 04/20/2017 1151    Lithium Lvl  Date Value Ref Range Status  10/21/2022 0.5 (L) 0.6 - 1.2 mmol/L Final  10/21/2022 lithium level 0.5 on 300 mg nightly is stable.,  Nortriptyline 115 on 75 mg HS.  08/02/22 nortriptyline 85, lihtium 0.5  Nortriptyline level 51 on 50 mg nightly with paroxetine 20 mg daily  07/10/2020 lithium level 0.6 at [facility] office and nortriptyline level 130 on the current dosages of lithium 300 mg nightly and nortriptyline 75 mg nightly  02/20/2021 labs lithium 0.6 on 300 mg nightly.  Nortriptyline level 49 which is lower than expected on 75 mg nightly along with paroxetine 20 mg daily.    No results found for: "PHENYTOIN", "PHENOBARB", "VALPROATE", "CBMZ"   .res Assessment: Plan:    Severe bipolar I disorder, current or most recent episode depressed, with catatonia (HCC)  Generalized anxiety disorder  Mild cognitive impairment  Lithium-induced tremor  Lithium use  Polyuria    History of lithium toxicity  repeatedly.  Hx TX resistant bipolar depression with catatonia.  Recent decompensation and rapidly getting worse with some cognive impairment and mild psychotic sx and altered mental status.Depression and anxiety much worse in the last several weeks.  Her case has been highly complex and treatment resistant at times. Disc pros and cons of return to ECT.  She is strongly opposed at this time.   Dialogue with Dr. Shirlee Latch cardiologist agreed pt should be stable enough from CV perspective to receive Spravato which could elevate BP.  If that occurs use prn metoprolol 25 mg. Disc in detail with Dr. Corinda Gubler and he agrees [place] is appropriate though not FDA approved for bipolar depression.  Disc 2 metanalysis in support of this and this alernative is safer and likely better tolerated than the option of ECT and faster than options of trials of Trintellix, Auvelity, Latuda etc.  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  During the severe depression she has catatonia.  She had required longterm ECT.  A previous episode of depression approximately 10 years ago stayed in remission on Paxil and nortriptyline and lithium for a period of years until the lithium level was decreased due to tremor.  Now we are maintaining an adequate lithium level.   She has had several episodes of lithium toxicity with hospitalization.  This resolved once she stopped chlorthalidone.  From records it appears her last ECT was in September 2020.    Patient was administered Spravato 84 mg intranasally today.  The patient experienced the typical dissociation which gradually resolved over the 2-hour period of observation.  There were no complications.  Specifically the patient did not have nausea or vomiting or headache.  Blood pressures did go up at 40-minute and 2-hour follow-up intervals.  By the time the 2-hour observation period was met the patient was alert and oriented and able to exit  without assistance.  Patient feels the Spravato administration is helpful for the treatment resistant depression and would like to continue the treatment.  See nursing note for further details. She received the metoprolol 25 mg prn before starting Spravato  Will review the old chart and consider augmentation with very low-dose Vraylar or perhaps pramipexole  She seems to be tolerating the meds well other than the lithium tremor which is being fairly  managed with primidone.  She is continuing under the care of her neurologist Dr. Arbutus Leas.  The nortriptyline was increased to 75 to try to reduce tendency to depression in February 2020.  08/02/22 nortriptyline 85, lihtium 0.5 Received 10/26/22= 115.  Reduced nortriptyline to 50 mg HS to protect from SE including jitteriness and reduced appetite and cognitive dysfunction and increase mirtazapine instead for mood, anxiety, appetite, and sleep  Lithium level goals are between 0.5 and 0.7.  She has a history of lithium toxicity when she was taking chlorthalidone with the lithium.   Discussed signs and symptoms of lithium toxicity.  Discussed the risk of increasing depression if the lithium level gets too low.  She cannot tolerate higher doses of lithium.  There remains some concern about drug interactions between paroxetine and nortriptyline and given her age we do not want the nortriptyline levels higher than necessary .  Disc risk polypharmacy  Continue Lithium 300 mg daily  Continue nortriptyline 50 mg daily Continue paroxetine 20 mg daily Continue vitamin B6 and primidone for tremor Increased  mirtazapine 30 mg HS for appetite,  used for sleep and depression.  Appetite better For catatonia: Switch lorazepam to [facility] 2 mg AM and use lorazepam 0.5 mg prn. For catatonia in hopes of more consistent relief. Decision based on observation of periods of almost normalcy after some BZ doses.  It is available at the pharmacy today.  We discussed potential  switch of paroxetine and nortriptyline to Auvelity or Trintellix or possibly [place].  However these switches will be difficult and it is hoped that the Spravato may provide enough improvement to allow a switch in medicine more tolerable.  Continue Spravato 84 mg twice weekly.  Disc BZ can diminish benefit but catatonia requires BZ  Meredith Staggers MD, DFAPA Please see After Visit Summary for patient specific instructions.  Future Appointments  Date Time Provider Department Center  12/01/2022  2:00 PM Cottle, Steva Ready., MD [facility] None  12/01/2022  2:00 PM CP-NURSE [facility] None  12/02/2022 10:00 AM Mathis Fare, LCSW [facility] None  01/03/2023  3:20 PM [facility] DEVICE REMOTES [facility]  01/18/2023  9:30 AM Napoleon Form, MD [facility]  04/04/2023  3:20 PM [facility] DEVICE REMOTES [facility]  07/04/2023  3:20 PM [facility] DEVICE REMOTES [facility]  10/03/2023  7:00 AM [facility] DEVICE REMOTES [facility]      No orders of the defined types were placed in this encounter.      -------------------------------

## 2022-11-29 ENCOUNTER — Encounter (HOSPITAL_BASED_OUTPATIENT_CLINIC_OR_DEPARTMENT_OTHER): Payer: Medicare Other | Admitting: Physical Therapy

## 2022-11-29 ENCOUNTER — Ambulatory Visit (HOSPITAL_BASED_OUTPATIENT_CLINIC_OR_DEPARTMENT_OTHER): Admission: RE | Admit: 2022-11-29 | Payer: Medicare Other | Source: Ambulatory Visit

## 2022-11-29 ENCOUNTER — Ambulatory Visit: Payer: Medicare Other

## 2022-11-29 ENCOUNTER — Other Ambulatory Visit (HOSPITAL_COMMUNITY): Payer: Self-pay

## 2022-11-29 ENCOUNTER — Other Ambulatory Visit (HOSPITAL_BASED_OUTPATIENT_CLINIC_OR_DEPARTMENT_OTHER): Payer: Self-pay

## 2022-11-29 ENCOUNTER — Encounter: Payer: Self-pay | Admitting: Gastroenterology

## 2022-11-29 ENCOUNTER — Telehealth: Payer: Self-pay

## 2022-11-29 ENCOUNTER — Ambulatory Visit (INDEPENDENT_AMBULATORY_CARE_PROVIDER_SITE_OTHER): Payer: Self-pay | Admitting: Psychiatry

## 2022-11-29 VITALS — BP 134/52 | HR 60

## 2022-11-29 DIAGNOSIS — R3589 Other polyuria: Secondary | ICD-10-CM

## 2022-11-29 DIAGNOSIS — F061 Catatonic disorder due to known physiological condition: Secondary | ICD-10-CM

## 2022-11-29 DIAGNOSIS — G3184 Mild cognitive impairment, so stated: Secondary | ICD-10-CM

## 2022-11-29 DIAGNOSIS — F314 Bipolar disorder, current episode depressed, severe, without psychotic features: Secondary | ICD-10-CM

## 2022-11-29 DIAGNOSIS — G251 Drug-induced tremor: Secondary | ICD-10-CM

## 2022-11-29 DIAGNOSIS — Z79899 Other long term (current) drug therapy: Secondary | ICD-10-CM

## 2022-11-29 DIAGNOSIS — F411 Generalized anxiety disorder: Secondary | ICD-10-CM

## 2022-11-29 MED ORDER — OLANZAPINE 2.5 MG PO TABS
2.5000 mg | ORAL_TABLET | Freq: Every day | ORAL | 0 refills | Status: DC
  Filled 2022-11-29 (×2): qty 30, 30d supply, fill #0

## 2022-11-29 NOTE — Telephone Encounter (Signed)
[employer] just called and they have approved the Olanzapine 2.5 mg for 1 year starting today.

## 2022-11-29 NOTE — Telephone Encounter (Signed)
Prior Approval received for Olanzapine 2.5 mg effective 11/29/2022-11/30/2023 with [employer] Medicare.   Her nurse, [name] is notified.

## 2022-11-29 NOTE — Progress Notes (Signed)
NURSES NOTE:     Patient arrived for her 4th total Spravato treatment. Pt is being treated for Treatment Resistant Depression, the starting dose is 56 mg (2 of the 28 mg) nasal sprays and today's dose is 84 mg, this is her 1st dose of that strength. She tolerated the 56 mg doses well so [name] would like to increase to the maintenance dose to get the full effect of her treatment. Pt is under Dr. Casimiro Needle care, that is who will follow her treatment with Spravato as well.  She is accompanied today with her aide and husband, Dr. Lyla Son. I did go over things again with them all as well as [name]. I answered any questions and concerns the patient or anyone had. Pt's Spravato is ordered through [facility] and the pt pays out of pocket with them.  All Spravato medication is stored at doctors office per REMS/FDA guidelines. The medication is required to be locked behind two doors per FDA/REMS Protocol. Medication is also disposed of properly per regulations.      Pt taken back to treatment room to begin patient's vital signs. I did use the wrist cuff for her, it seems to work better for her. Pt did receive a dose of Metoprolol 25 mg and her lorazepam prior to visit today.  Beginning at 2:00 PM, 175/78, pulse 58, Pulse Ox 98%. Instructed patient to blow her nose then recline back as well as she could. Pt was assisted some by her husband Dr. Lyla Son due to pt's age and shakiness with holding the nasal spray, administered in each nostril as directed and waited 5 more minutes for the second and third doses. After all doses given her aide and husband stayed with her. No complaints voiced, I checked pt's 40 minute vitals at 2:35 PM, 178/84, pulse 61. Rechecked again at 2:55 PM, 185/76, pulse 60. Then again at 3:15 PM 169/58, pulse 61. Explained she would be monitored for a total time of 120 minutes. Discharge vitals were taken at 4:00 PM 152/59, P 61. [name] came over to visit with patient and Dr.  Velora Heckler and discuss how treatment went for her first 84 mg dose treatment. Pt remains irritable about receiving treatments and feels like a failure today because it didn't work like it did her 1st treatment. I explained she will not always get the same reaction at each visit, even if it's the same dose. Recommend she go home and sleep or just relax. Verbalized understanding from all of them. Pt. will be receiving 2 treatments per week for 4 weeks as recommended. She will get another dose of 84 mg on Friday, February 23rd. Nurse was with pt a total of 60 minutes for clinical assessment. Pt instructed to call office with any problems or questions before she returns.      LOT'23MG'$ [id number] EXP JAN 2027

## 2022-11-29 NOTE — Telephone Encounter (Signed)
Prior Authorization submitted for Olanzapine 2.5 mg tablet with [employer] Medicare, pending response

## 2022-11-29 NOTE — Progress Notes (Signed)
NURSES NOTE:     Patient arrived for her 5th total Spravato treatment. Pt is being treated for Treatment Resistant Depression, the starting dose is 56 mg (2 of the 28 mg) nasal sprays and today's dose is 84 mg, this is her 2nd dose of that strength. She tolerated the 1st dose of 84 mg well so we will continue this maintenance dose for her. Pt is under Dr. Casimiro Needle care, that is who will follow her treatment with Spravato as well.  She is accompanied today with her aide and husband, Dr. Lyla Son. I did go over things again with them all as well as [name]. I answered any questions and concerns the patient or anyone had. Pt's Spravato is ordered through [facility] and the pt pays out of pocket with them.  All Spravato medication is stored at doctors office per REMS/FDA guidelines. The medication is required to be locked behind two doors per FDA/REMS Protocol. Medication is also disposed of properly per regulations.      Pt was in a wheel chair today due to being weaker then usual, she appeared very sleepy this morning, the aid took her to the restroom first then back to treatment room to begin patient's vital signs. I did use the wrist cuff for her, it seems to work better for her. Beginning at 10:40 AM, 117/76, pulse 86, Pulse Ox 98%. Instructed patient to blow her nose then recline back as well as she could. Pt was assisted some by her husband Dr. Lyla Son due to pt's age and shakiness with holding the nasal spray, administered in each nostril as directed and waited 5 more minutes for the second and third doses. After all doses given her aide and husband stayed with her. No complaints voiced, I checked pt's 40 minute vitals at 11:30 AM, 142/79, pulse 82. Pt's B/P was much more stable today, no medication needed at this time to lower B/P.  Explained she would be monitored for a total time of 120 minutes. Discharge vitals were taken at 12:40 PM 156/64, P 70. [name] came over to visit with  patient and Dr. Velora Heckler and discuss how treatment went for her second 84 mg dose treatment. Pt remains irritable about receiving treatments and feels like a failure today because it didn't work like it did her 1st treatment. I explained she will not always get the same reaction at each visit, even if it's the same dose. Recommend she go home and sleep or just relax. Verbalized understanding from all of them. Pt. will be receiving 2 treatments per week for 4 weeks as recommended. She will get another dose of 84 mg on Monday, February 26th. Nurse was with pt a total of 60 minutes for clinical assessment. Pt instructed to call office with any problems or questions before she returns.      LOT'23MG'$ [id number] EXP JAN 2027

## 2022-11-30 ENCOUNTER — Encounter: Payer: Self-pay | Admitting: Psychiatry

## 2022-11-30 ENCOUNTER — Other Ambulatory Visit (HOSPITAL_BASED_OUTPATIENT_CLINIC_OR_DEPARTMENT_OTHER): Payer: Self-pay

## 2022-11-30 ENCOUNTER — Other Ambulatory Visit: Payer: Self-pay

## 2022-11-30 ENCOUNTER — Other Ambulatory Visit: Payer: Self-pay | Admitting: Psychiatry

## 2022-11-30 DIAGNOSIS — F411 Generalized anxiety disorder: Secondary | ICD-10-CM

## 2022-11-30 DIAGNOSIS — F061 Catatonic disorder due to known physiological condition: Secondary | ICD-10-CM

## 2022-11-30 MED ORDER — LOREEV XR 2 MG PO CS24
1.0000 | EXTENDED_RELEASE_CAPSULE | Freq: Every morning | ORAL | 0 refills | Status: DC
  Filled 2022-11-30 – 2022-12-01 (×2): qty 7, 7d supply, fill #0

## 2022-11-30 NOTE — Progress Notes (Signed)
Meghan Oliver [id number] 10-Nov-1940 82 y.o.  Subjective:   Patient ID:  Meghan Oliver is a 82 y.o. (DOB Mar 25, 1941) female. Patient was last seen August 21, 2018 Chief Complaint:  Chief Complaint  Patient presents with   Follow-up   Depression   Anxiety   Altered Mental Status     Meghan Oliver presents to the office today for follow-up of Severe TR bipolar depression with psychotic features including catatonia.  11/30/2018 was the last visit and the following was noted: Had ECT yesterday at [facility].  Last was Jan 8 and was doing well then.  Next scheduled April 15.  Had a lithium level from there which is pending. Pretty good until the last 10 days to 2 weeks with less energy and motivation.  Bothered by old sick dog.  Worried about how that will affect her.  Has slept with the dog.  Enjoyed [place].  Going to gym and playing [place].  Took the class and didn't feel she caught on quickly. Wonders if it is bc of the ECT memory effects.  Bridge is harder and not playing.  Planning to return to [facility] mid April.   spent the winter in [place] per usual..  No significant depression since here. ECT frequency about 6 weeks.  Gets very anxious the day of the ECT.  ECT consistently for a year.  Best in mood in 7-8 years.  Also the pacemaker helped.  Friends notice the benefit.  Plan:  Option increase in the nortriptyline if needed.  Considered this.  They agree and would like to increase nortriptyline to 75 to try to reduce tendency to depression before the ECT.   03/19/19  Addendum: Here the contents from an email from the nurse for Meghan Oliver  Hello Meghan Oliver,   I Oliver you and your family are healthy and well. I took Meghan Oliver to [facility] for ECT and here are labs from 01/26/19. I am going to also forward them to [facility] for follow up with her Meghan Oliver's. They are still higher than I would like.  Her lithium level was 0.51 on a daily dose of '300mg'$  qohs x 4 nights- alternating with '450mg'$  qohs x 3 nights. She  is not demonstrating any signs of elevated levels, minimal hand tremors, less anxiety and restlessness since her ECT treatment.  Her Nortryptyline level was 140 on '75mg'$  qhs.  I would like to continue her current dosages of both the above meds and recheck her lithium level at her next scheduled ECT of 03/23/19.  Please let me know of any changes you would like to make. Take care, Meghan Quan RN Lithium level was 0.4 on the '450mg'$  alt with 300 mg QOD. No changes were made in meds.  11/15/2019 phone call: Telephone call from patient's husband Meghan Oliver on 11/14/2019  Patient is scheduled for ECT soon for maintenance ECT.  He thinks it is been 2-1/2 months since the last ECT but he is going to check the date for sure.  It is still be done being done at [facility].  He is wondering about skipping this treatment because the patient has continued to be free of depression and seems cognitively clearer as these ECT treatments have been spread out further from each other.  Patient remains on medications as prescribed.  If it is truly been over 2 months since the last ECT then it is reasonable to consider discontinuing ECT.  It is unlikely that ECT with the frequency of  greater than 2 months is significantly helpful at preventing her relapse.  If however the ECT frequency is less than every 2 months it could still be helping to prevent recurrence.  He indicated he would consider this information and discuss it with her ECT team and make a decision.  They are heading to [place] in the next few weeks.  Needs to schedule an appointment with me for follow-up because it is been many months.  He agrees.   02/19/2020 appt, the following noted: Moving to [place] in a few months.  Ready to downsize.   Stopped ECT as discussed and has not been more deprressed.   Walks dog 4 times daily.   Lithium 0.7 on  01/30/20 on lithium 300 mg plus 150 mg on M, W, F Nortriptyline 70 on 75 mg HS. No SE except tremor.  Balance is  not great.  Very happy.   No depression since here.  No mood swings.  Sleep and appetite is OK.  No med changes:  04/03/2020 TC with the following noted: Patient's husband called stating that her tremor was a little worse and she was having some stutter. No evidence of stroke otherwise. First thing would be to check serum lithium level and BMP.  Order written.  Have asked her husband to let us know about the lab to which it should be sent.  04/22/20 TC witht he following noted: Lithium level is not dangerously high but it is has crept up to 1.0 which is higher than desired for her.  Our goal for her is 0.5-0.7.  At the current level it is likely causing side effects so we should reduce the dosage from lithium 300 mg plus 150 mg on M, W, F  TO 300 mg daily.  Meaning drop off the 150 mg capsule.  It will take about 2 weeks for the level to gradually come down to the desired level.  05/08/2020 appt with the following noted: Seen with H and Meghan Oliver nurse. Moving to [facility] and feels OK about it.  I think I'll be fine.   Pretty good with balance.  Doing yoga and it helps. Meghan Oliver has cancer. Old dog 14 & 1/2 yo still living. Meghan Oliver's concerns when went to [place] for D's BD she had difficulty time with tremor lethargy, more confusion.  Better when go back home. Meghan Oliver notes using more lorazepam. At times gait is unsteady. Not significantly depressed.  Can be anxious at times.  Eating okay.  Sleeping okay.  Is easily confused under stress. Plan: reduce lithium to 300 mg nightly.  07/14/20 appt with the following noted: Seen with her husband as well as her family nurse. Feeling fine and pleased with that.   Some anxiety over the move to smaller place. Meghan Oliver's twin also having heart problems Meghan Oliver. Meghan Oliver's kids came and visited.    Plan: Continue nortriptyline 75 mg, paroxetine 20 mg, lithium 300 mg, Meghan Oliver 10 mg twice daily, lorazepam 0.5 mg 1/2-1 3 times daily as needed, mirtazapine 7.5 mg  nightly  02/27/2021 appointment with the following noted:  Seen with H and nurse Aurora email from nurse Meghan Oliver on 02/24/2021 indicated that he had moved into [facility] in May.  Loreane has gradually become more depressed and more anxious using lorazepam as prescribed 3 times daily.  More memory issues.  She has remained engaged and initiating appropriate conversation.  She is very worried about her 38 year old dog who is in poor health and fears  she will become more depressed when the dog dies.  She and her husband do not wish to prefer to pursue further ECT unless absolutely necessary. Likes [facility] so far.   No depression by her report.  Sold big house in Feb.  It worked out well.   Worries about her dog 35 yo will die soon. H agrees she's done well with depression.   Enjoys things and has interests. Plan: Continue Lithium 300 mg daily  Continue nortriptyline 75 mg daily Continue paroxetine 20 mg daily Continue lorazepam 0.5 mg 3 times daily for both anxiety and catatonia Continue vitamin B6 and primidone for tremor Cerefolin NAC 2 daily.  05/13/2021 appointment with the following noted: Happy with [place].  H and she agree is doing well with depression.Kermit Balo socialization. She complains of memory.   Family visited and getting along with Baker Janus better.  H says Baker Janus is acting better.  Goes to dinner and activities together now.  Better relationship with Meghan Oliver.. Tremor has been OK.   Ativan usually 0.25 mg TID and tolerated. Anxiety managed with this generally. Molly dog health more stable. To [place] before T'giving until April.   Plan: Continue Lithium 300 mg daily  Continue nortriptyline 75 mg daily Continue paroxetine 20 mg daily Continue lorazepam 0.5 mg 3 times daily for both anxiety and catatonia Continue vitamin B6 and primidone for tremor   08/14/2021 appointment with the following noted: Seen with her husband Meghan Oliver and nurse Meghan Oliver. Everyone reports that  her mood has been stable.  Her anxiety is manageable with as needed lorazepam.  She is still happy with the transition to [facility].  They are preparing to go to [place] for the winter per usual but may sell their house father there. Tolerating meds without any unusual side effects. Is dealing with grief over the loss of her dog Molly.  03/10/2022 appt noted:  seen with H and nurse Vicie Mutters [facility] house. Meghan Oliver not good phsyically hard to play golf.  Been really hard. He'll be 85 in July.  Some stress scheduling with D's.   58 th wedding necessary this month.   Mood up and down.  Sister in law across the street can be difficult and demeaning.  She feels unsettled.  Some worry. Some back pain limiting activity.   Sleep is ok except with pain awakening.   No SE, except tremor some worse about 3-5 and helped by lorazepam 0.25 mg then No greater than 1 mg lorazepam daily Patient denies any recent difficulty with anxiety except as noted.  Patient denies difficulty with sleep initiation or maintenance. Denies appetite disturbance.    Patient has some difficulty with concentration.  Patient denies any suicidal ideation.  Usually takes Ativan at night and sleeps well 8 hours.   Plan no med changes  10/23/213 appt noted: Still doing well. Pneumonia and hosp for 6 days 3 weeks ago.  Viral.  Improving at this time. Increased tremor gradually in 3-4 mos helped by lorazepam avg 1.00-1.25 mg daily. No change in primidone. Total R hip August 2023 and done well.  Finishing home PT.   Appetite poor for 7-8 mos.  2 protein drinks daily but may not eat more than 1 good meal a day. [facility]. Substantial memory problems in hospital and in unusual enviornments. Nurse notices decline.  B with LBD passed in August. No pain meds or muscle relaxants beyond pain. No depression.  H note sometimes she has less motivation than others. Meghan Oliver notes some normal  waxing and waning of mood. Good social interaction.   Not  spending winter in [facility] this year.  D still lives there. Plan: Increase mirtazapine 15 mg HS for appetite.  Also used for sleep and depression.  09/23/22 appt noted:  with nurse and Meghan Oliver Meghan Oliver feeling bad with more pain and fatigue.  Meghan Oliver has CHF.   Made Janavia sad.  Lost a cgood friends.  Rough time.  Mostly worry about Meghan Oliver.  She feels afraid to be alone if something happens.   Not sig depressed.    Just worry . H can't be as active.  Taking a shower is hard.   She's back exercising again.   Went to [employer] for Thanksgiving in [facility] and plan to go back for Christmas. It's hard to travel. Told nurse she was depressed. Appetite is better and once asleep is OK. Using more lorazepam for anxiety 3-4 daily. Plan: Continue Lithium 300 mg daily  Continue nortriptyline 75 mg daily Continue paroxetine 20 mg daily Continue lorazepam 0.5 mg 3 times daily for both anxiety and catatonia Continue vitamin B6 and primidone for tremor Increased and it helped mirtazapine 15 mg HS for appetite.  Also used for sleep and depression.  10/19/22  RTC  Meghan Oliver reports not doing well.  Went to [facility].  Recent pneumonia.  They are thinking this is aspiration pneurmonia. Lethargic and anxious with tremor. Ruminating.  Hot to cold .  Low tolerance.  Going downhill quickly.  She's verbalizing depression and desire to isolate.  Won't go out.  Started within the last 2 weeks. Usually doesn't complain when she's sick.   Meghan Oliver's biggest concern is that Inocente Salles is having trouble handling this bc she's up at night rambling and not really safe to be up at night.    Will see her 10/22/22 Get lithium and nortriptyline levels.   Lynder Parents, MD, [facility]     10/22/22 emergency appt with nurse Meghan Oliver and H Meghan Oliver: Pneumonia Oct and then again 2nd week January.  It appears to have largely resoved.  It's really bad.  Nausea. Anxious bc Meghan Oliver ill.  Doesn't want him to leave.  Meghan Oliver sees anxiety triggering anxiety with little to no appetite.  Hypersensitive to  stimuli like light and noises.  Doesn't want TV on.  Meghan Oliver says totally reversed in last 6 weeks.    Meghan Oliver reports she's overemotional.  Friends died. Losing wt 91.4# today. More trouble with sleep. Lorazepam increased to 2 mg daily.  Not sleepy with it.  Doesn't seem to be helpful. Last week neglect hygiene but better this week a little. Meghan Oliver says STM is poor at times and at times disoriented if in [place].  Will walk in bathroom and wonder where sh is. Barium swallow pending for evaluation of refulex.    10/26/22 RTC:  RTC   No improvement since Friday.  Gradually less engaged.  Can be overstimulated with visitors.   Disc nortriptyline level 115 is a little higher than it was and higher levels can cause SE in elderly.   Reduce nortriptyline to 50 mg HS. Taking lorazepam    Consider low dose of Vraylar or pramipexole.  Or Spravato.     Lynder Parents, MD, [facility]     11/15/22 appt noted: Dialogue with Dr. Aundra Dubin cardiologist agreed pt should be stable enough from CV perspective to receive Spravato which could elevate BP.  If that occurs use prn metoprolol 25 mg. Disc in detail with Dr. Velora Heckler and he agrees Ebony Cargo is appropriate  though not FDA approved for bipolar depression.  Disc 2 metanalysis in support of this and this alernative is safer and likely better tolerated than the option of ECT and faster than options of trials of Trintellix, Auvelity, Latuda etc. Tolerating meds.  She is depressed and very anxious and intermittently confused.  Taking clothes on and off bc rapid changes in comfort.  Worried over [employer].  Sleep disrupted appetite some better with mirtazapine. Received Spravato 56 mg first time today and was dissociated.  Resolved over 2 hours.  No HA, NV.  Was distressed moderately and very ambivalent about Tx.  H and nurse supportive of tx plan and for the pt.    11/24/22 appt noted: Current meds: nortriptyline 50 mg HS, fluoxetine 20 mg daily, lithium 300 mg nightly, mirtazapine  30 mg nightly, memantine 10 mg twice daily.  Lorazepam 0.5 mg every 4 to 6 hours as needed catatonia or severe anxiety. Received first dose of Spravato 84 mg today.  She was more noticeably dissociated than with the 56 mg dose.  It did resolve over the 2-hour course of observation although she remained somewhat unsteady and needed to leave via wheelchair.  She did not have headache nausea or vomiting.  She remains ambivalent about the treatment. Husband notes she has continued to be anxious and depressed and was extremely anxious yesterday and somewhat confused.  At times would say things that did not make sense.  He remained supportive of the treatment while she remains ambivalent about the treatment.  Her appetite is good and she is eating well.  Sleep is variable.  H reported at times will be almost normal after receiving an extra dose of lorazepam 0.5 mg prn but this only lasts an hour or so. Plan: Continue Lithium 300 mg daily  Continue nortriptyline 50 mg daily Continue paroxetine 20 mg daily Continue vitamin B6 and primidone for tremor Imirtazapine 30 mg HS for appetite,  used for sleep and depression.  Appetite better Switch lorazepam to [facility] 2 mg AM and use lorazepam 0.5 mg prn.  For catatonia in hopes of more consistent relief.  Decision based on observation of periods of almost normalcy after some BZ doses.  We discussed potential switch of paroxetine and nortriptyline to Auvelity or Trintellix or possibly [place].  However these switches will be difficult and it is hoped that the Spravato may provide enough improvement to allow a switch in medicine more tolerable.  11/26/22 appt noted: Current meds: nortriptyline 50 mg HS, fluoxetine 20 mg daily, lithium 300 mg nightly, mirtazapine 30 mg nightly, memantine 10 mg twice daily.  Lorazepam 0.5 mg every 4 to 6 hours as needed catatonia or severe anxiety.  Started Loreev 2 mg daily over weekend Received first dose of Spravato 84 mg today.    Spravato with expected dissociation.  She didn't experience relief from negative emotion.  Dissociation resolved over observation period.  No N, V, HA.   Still high anxiety and dread of most everything.  Depressed with negative thought predominates.  H notes periods of confusion and then maybe an hour of clarity sometimes after the dosing of lorazepam.  Then back to severe sx.  Eating is fair. No SI.  Fearful generally.  Needs help with ADLs.  Cannot take meds on her own. Tolerating meds without SE No noticeable effect from Loreev in terms of benefit or SE  No current alcohol problems  Saw Dr. Carles Collet last week and no change in neuro meds.  Past  Psychiatric Medication Trials: Prozac with lithium and Zyprexa 2.5 to 5 mg,  nortriptyline, paroxetine, ,  pramipexole, mirtazapine,  Rexulti, Abilify, Vraylar, Latuda 10 mg,  lamotrigine.   Meghan Oliver,  lorazepam,   this loss list is not exhaustive  Review of Systems:  Review of Systems  Constitutional:  Positive for appetite change and fatigue.  Respiratory:  Negative for shortness of breath.   Cardiovascular:  Negative for palpitations.  Gastrointestinal:  Positive for constipation and nausea.       Linzess and Mozantic managed   Musculoskeletal:  Positive for gait problem.  Neurological:  Positive for dizziness and weakness. Negative for tremors.       Some balance issues  Psychiatric/Behavioral:  Positive for decreased concentration and dysphoric mood. Negative for agitation, behavioral problems, confusion, hallucinations, self-injury, sleep disturbance and suicidal ideas. The patient is nervous/anxious. The patient is not hyperactive.     Medications: I have reviewed the patient's current medications.  Current Outpatient Medications  Medication Sig Dispense Refill   amoxicillin (AMOXIL) 500 MG capsule Take 3 capsules (1,500 mg total) by mouth 1 hour prior to dental work. 9 capsule 1   aspirin 81 MG chewable tablet Chew 1 tablet (81 mg  total) by mouth daily.     Calcium Carbonate-Vitamin D (CALCIUM 600/VITAMIN D PO) Take 1 tablet by mouth 2 (two) times daily.     cetirizine (ZYRTEC) 10 MG tablet Take 10 mg by mouth daily as needed for allergies.     Cholecalciferol (VITAMIN D3) 50 MCG (2000 UT) TABS Take 2,000 Units by mouth in the morning.     COVID-19 mRNA vaccine 2023-2024 (COMIRNATY) syringe Inject into the muscle. 0.3 mL 0   dexlansoprazole (DEXILANT) 60 MG capsule Take 1 capsule by mouth daily 90 capsule 3   Esketamine HCl, 84 MG Dose, (SPRAVATO, 84 MG DOSE,) 28 MG/DEVICE SOPK Place 84 mg into the nose every 3 (three) days. 3 each 5   fesoterodine (TOVIAZ) 8 MG TB24 tablet Take 1 tablet (8 mg total) by mouth daily. 90 tablet 3   glycerin adult 2 g suppository Use 1 suppository as needed if you have not had a bowel movement in  4 to 5 days 25 suppository 0   influenza vaccine adjuvanted (FLUAD QUADRIVALENT) 0.5 ML injection Inject into the muscle. 0.5 mL 0   Krill Oil 500 MG CAPS Take 500 mg by mouth daily.     lactulose (CHRONULAC) 10 GM/15ML solution Take 30 mLs (20 g total) by mouth 3 (three) times daily between meals as needed for mild constipation. (Patient taking differently: Take 20 g by mouth daily as needed for mild constipation.) 473 mL 3   Lavender Oil 80 MG CAPS Take 160 mg by mouth at bedtime.     levothyroxine (SYNTHROID) 75 MCG tablet Take 1 tablet (75 mcg total) by mouth daily. 90 tablet 2   levothyroxine (SYNTHROID) 75 MCG tablet Take 1 tablet (75 mcg total) by mouth daily. 90 tablet 2   linaclotide (LINZESS) 72 MCG capsule Take 2 capsules (144 mcg total) by mouth daily before breakfast. 180 capsule 1   lithium carbonate 300 MG capsule Take 1 capsule (300 mg total) by mouth at bedtime. 90 capsule 0   LORazepam (ATIVAN) 0.5 MG tablet TAKE 2 TABLETS (1 MG)  BY MOUTH EVERY NIGHT AT BEDTIME AND 1 TABLET EVERY 4 HOURS AS NEEDED FOR ANXIETY (Patient taking differently: Take 0.5 mg by mouth every 6 (six) hours as  needed. TAKE 2 TABLETS (1 MG)  BY MOUTH EVERY NIGHT AT BEDTIME AND 1 TABLET EVERY 4 HOURS AS NEEDED FOR ANXIETY) 270 tablet 0   LORazepam ER (LOREEV XR) 2 MG CS24 Take 1 capsule by mouth every morning. 7 capsule 0   magic mouthwash w/lidocaine SOLN Take 5 mLs by mouth 3 (three) times daily as needed for mouth pain. 120 mL 0   memantine (Meghan Oliver) 10 MG tablet Take 1 tablet (10 mg total) by mouth 2 (two) times daily. 180 tablet 1   metoprolol tartrate (LOPRESSOR) 25 MG tablet Take 1 tablet (25 mg total) by mouth as needed. 45 tablet 3   Multiple Vitamins-Minerals (MULTIVITAMIN WITH MINERALS) tablet Take 1 tablet by mouth daily.     Multiple Vitamins-Minerals (PRESERVISION AREDS 2 PO) Take 1 capsule by mouth in the morning and at bedtime.     naloxegol oxalate (MOVANTIK) 12.5 MG TABS tablet Take 2 tablets (25 mg total) by mouth daily. (Patient taking differently: Take 25 mg by mouth daily. PRN) 60 tablet 3   nortriptyline (PAMELOR) 25 MG capsule Take 2 capsules (50 mg total) by mouth at bedtime. 180 capsule 1   OLANZapine (ZYPREXA) 2.5 MG tablet Take 1 tablet (2.5 mg total) by mouth at bedtime. 30 tablet 0   ondansetron (ZOFRAN-ODT) 4 MG disintegrating tablet Place 1 tablet every 6 hours by translingual route as needed. 20 tablet 0   PARoxetine (PAXIL) 20 MG tablet Take 1 tablet (20 mg total) by mouth at bedtime. 90 tablet 0   pneumococcal 20-valent conjugate vaccine (PREVNAR 20) 0.5 ML injection Inject into the muscle. 0.5 mL 0   polyethylene glycol (MIRALAX / GLYCOLAX) 17 g packet Take 17 g by mouth daily as needed for mild constipation.     primidone (MYSOLINE) 50 MG tablet TAKE 2 TABLETS BY MOUTH EVERY MORNING AND 1 TABLET EVERY EVENING 270 tablet 0   Pyridoxine HCl (VITAMIN B-6) 500 MG tablet Take 500 mg by mouth 2 (two) times daily.     RSV vaccine recomb adjuvanted (AREXVY) 120 MCG/0.5ML injection Inject into the muscle. 0.5 mL 0   sennosides-docusate sodium (SENOKOT-S) 8.6-50 MG tablet Take 2  tablets by mouth as needed.     valACYclovir (VALTREX) 1000 MG tablet Take 2 tablets by mouth at onset, then 2 tablets 12 hours later. (Patient taking differently: Take 2,000 mg by mouth every 12 (twelve) hours. PRn) 30 tablet 1   vitamin B-12 (CYANOCOBALAMIN) 1000 MCG tablet Take 1,000 mcg by mouth at bedtime.     No current facility-administered medications for this visit.    Medication Side Effects: Other: tremor  stable.  No worse.  Allergies:  Allergies  Allergen Reactions   Propranolol Other (See Comments)    Low blood pressure. Bradycardia    Brexpiprazole Other (See Comments)    Aphasia and catatonia    Past Medical History:  Diagnosis Date   Anxiety    Arthritis    "hips, spine" (03/17/2018)   Bipolar II disorder (HCC)    CHF (congestive heart failure) (HCC)    Chronic bronchitis (HCC)    Chronic lower back pain    Chronic right hip pain    CKD (chronic kidney disease), stage II    Coronary artery disease    stent x1   Esophagitis, erosive    GAD (generalized anxiety disorder)    GERD (gastroesophageal reflux disease)    Headache    "maybe monthly" (03/17/2018))   Heart murmur, systolic    History of adenomatous polyp of colon  08-04-2016  tubular adenoma   History of blood transfusion 12/2017   "related to vascular hematoma"   History of electroconvulsive therapy    at [facility]--  started 04-15-2015 to 11-17-2016  total greater than 40 times   History of hiatal hernia    Hyperlipidemia    Hypertension    Hypothyroidism    Internal carotid artery stenosis, bilateral    per last duplex 05-01-2014  bilateral ICA 40-59%   Major depression, chronic    ECT treatments extensive and multiple started 07/ 2016   Memory loss    "both short and long-term; needs frequent reminders to follow instrucitons" (05/16/2017)   Migraines    "none in years" (03/17/2018)   OSA (obstructive sleep apnea)    per study 06/ 2012 moderate OSA  ; "refuses to wear masks" (03/17/2018)    Osteoporosis    Pneumonia 07/29/2022   Poor historian    due to short term memory loss   Presence of permanent cardiac pacemaker 03/17/2018   Pulmonary nodule    monitored by pcp   S/P placement of cardiac pacemaker 03/17/18 [facility]  03/18/2018   Short-term memory loss    Sick sinus syndrome (HCC)     Family History  Problem Relation Age of Onset   Heart attack Father 3       deceased   Hypertension Father    Heart disease Father    Dementia Brother    Parkinson's disease Brother    Heart disease Brother    Breast cancer Paternal Aunt        Age 62's   Breast cancer Paternal Grandmother        Age unknown   Colon cancer Neg Hx     Social History   Socioeconomic History   Marital status: Married    Spouse name: Dr. Lyla Son   Number of children: 2   Years of education: Not on file   Highest education level: Not on file  Occupational History   Occupation: housewife    Employer: UNEMPLOYED  Tobacco Use   Smoking status: Former    Packs/day: 2.00    Years: 15.00    Total pack years: 30.00    Types: Cigarettes    Quit date: 01/13/1971    Years since quitting: 51.9    Passive exposure: Never   Smokeless tobacco: Never  Vaping Use   Vaping Use: Never used  Substance and Sexual Activity   Alcohol use: Yes    Comment: occassional 1 x a week   Drug use: Never   Sexual activity: Not Currently    Comment: intercourse age 21, sexual partners less than 5  Other Topics Concern   Not on file  Social History Narrative   Right handed   Social Determinants of Health   Financial Resource Strain: Not on file  Food Insecurity: No Food Insecurity (07/06/2022)   Hunger Vital Sign    Worried About Running Out of Food in the Last Year: Never true    Ran Out of Food in the Last Year: Never true  Transportation Needs: No Transportation Needs (07/06/2022)   PRAPARE - Hydrologist (Medical): No    Lack of Transportation (Non-Medical): No   Physical Activity: Not on file  Stress: Not on file  Social Connections: Not on file  Intimate Partner Violence: Not At Risk (07/06/2022)   Humiliation, Afraid, Rape, and Kick questionnaire    Fear of Current or Ex-Partner: No  Emotionally Abused: No    Physically Abused: No    Sexually Abused: No    Past Medical History, Surgical history, Social history, and Family history were reviewed and updated as appropriate.   Please see review of systems for further details on the patient's review from today.   Objective:   Physical Exam:  LMP  (LMP Unknown)   Physical Exam Constitutional:      General: She is not in acute distress.    Appearance: She is well-developed.  Musculoskeletal:        General: No deformity.  Neurological:     Mental Status: She is alert and oriented to person, place, and time.     Motor: No tremor.     Coordination: Coordination abnormal.     Comments: Cane use  Psychiatric:        Attention and Perception: She is attentive.        Mood and Affect: Mood is anxious and depressed. Affect is not labile, blunt, tearful or inappropriate.        Speech: Speech is not slurred.        Behavior: Behavior normal. Behavior is not slowed or withdrawn.        Thought Content: Thought content normal. Thought content is not delusional. Thought content does not include homicidal or suicidal ideation. Thought content does not include suicidal plan.        Cognition and Memory: She exhibits impaired recent memory.        Judgment: Judgment normal.     Comments: Insight fair to good. No auditory or visual hallucinations.  Depression and anxiety are worse lately.  ruminative Gait ok. Memory appears stable to maybe a little worse.     Lab Review:     Component Value Date/Time   NA 137 11/11/2022 1106   NA 136 04/20/2017 1151   K 4.6 11/11/2022 1106   CL 106 11/11/2022 1106   CO2 23 11/11/2022 1106   GLUCOSE 97 11/11/2022 1106   BUN 24 11/11/2022 1106   BUN 24  04/20/2017 1151   CREATININE 0.73 11/11/2022 1106   CALCIUM 9.4 11/11/2022 1106   PROT 6.9 11/11/2022 1106   PROT 6.2 04/20/2017 1151   ALBUMIN 3.9 09/02/2022 1221   ALBUMIN 3.9 04/20/2017 1151   AST 44 (H) 11/11/2022 1106   ALT 86 (H) 11/11/2022 1106   ALKPHOS 64 09/02/2022 1221   BILITOT 0.2 11/11/2022 1106   BILITOT <0.2 04/20/2017 1151   GFRNONAA >60 07/08/2022 0429   GFRNONAA 50 (L) 07/10/2020 1021   GFRAA 58 (L) 07/10/2020 1021       Component Value Date/Time   WBC 6.6 11/11/2022 1106   RBC 3.88 11/11/2022 1106   HGB 12.1 11/11/2022 1106   HGB 10.2 (L) 04/20/2017 1151   HCT 36.3 11/11/2022 1106   HCT 29.6 (L) 04/20/2017 1151   PLT 215 11/11/2022 1106   PLT 385 (H) 04/20/2017 1151   MCV 93.6 11/11/2022 1106   MCV 89 04/20/2017 1151   MCH 31.2 11/11/2022 1106   MCHC 33.3 11/11/2022 1106   RDW 13.7 11/11/2022 1106   RDW 13.9 04/20/2017 1151   LYMPHSABS 1,439 11/11/2022 1106   LYMPHSABS 1.5 04/20/2017 1151   MONOABS 0.7 07/07/2022 0458   EOSABS 112 11/11/2022 1106   EOSABS 0.3 04/20/2017 1151   BASOSABS 53 11/11/2022 1106   BASOSABS 0.0 04/20/2017 1151    Lithium Lvl  Date Value Ref Range Status  10/21/2022 0.5 (L) 0.6 - 1.2  mmol/L Final  10/21/2022 lithium level 0.5 on 300 mg nightly is stable.,  Nortriptyline 115 on 75 mg HS.  08/02/22 nortriptyline 85, lihtium 0.5  Nortriptyline level 51 on 50 mg nightly with paroxetine 20 mg daily  07/10/2020 lithium level 0.6 at [facility] office and nortriptyline level 130 on the current dosages of lithium 300 mg nightly and nortriptyline 75 mg nightly  02/20/2021 labs lithium 0.6 on 300 mg nightly.  Nortriptyline level 49 which is lower than expected on 75 mg nightly along with paroxetine 20 mg daily.    No results found for: "PHENYTOIN", "PHENOBARB", "VALPROATE", "CBMZ"   .res Assessment: Plan:    Severe bipolar I disorder, current or most recent episode depressed, with catatonia (HCC)  Generalized anxiety  disorder  Mild cognitive impairment  Lithium-induced tremor  Polyuria  Lithium use    History of lithium toxicity repeatedly.  Hx TX resistant bipolar depression with catatonia.  Recent decompensation and rapidly getting worse with some cognive impairment and mild psychotic sx and altered mental status.Depression and anxiety much worse in the last several weeks.  Her case has been highly complex and treatment resistant at times. Disc pros and cons of return to ECT.  She is strongly opposed at this time.   Dialogue with Dr. Aundra Dubin cardiologist agreed pt should be stable enough from CV perspective to receive Spravato which could elevate BP.  If that occurs use prn metoprolol 25 mg. Disc in detail with Dr. Velora Heckler and he agrees Ebony Cargo is appropriate though not FDA approved for bipolar depression.  Disc 2 metanalysis in support of this and this alernative is safer and likely better tolerated than the option of ECT and faster than options of trials of Trintellix, Auvelity, Latuda etc.  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  During the severe depression she has catatonia.  She had required longterm ECT.  A previous episode of depression approximately 10 years ago stayed in remission on Paxil and nortriptyline and lithium for a period of years until the lithium level was decreased due to tremor.  Now we are maintaining an adequate lithium level.  She has had several episodes of lithium toxicity with hospitalization.  This resolved once she stopped chlorthalidone.  From records it appears her last ECT was in September 2020.    Patient was administered Spravato 84 mg intranasally today.  The patient experienced the typical dissociation which gradually resolved over the 2-hour period of observation.  There were no complications.  Specifically the patient did not have nausea or vomiting or headache.  Blood pressures did go up at 40-minute and 2-hour  follow-up intervals.  By the time the 2-hour observation period was met the patient was alert and oriented and able to exit without assistance.  Patient feels the Spravato administration is helpful for the treatment resistant depression and would like to continue the treatment.  See nursing note for further details. She received the metoprolol 25 mg prn before starting Spravato  Will review the old chart and consider augmentation with very low-dose Vraylar or perhaps pramipexole  She seems to be tolerating the meds well other than the lithium tremor which is being fairly  managed with primidone.  She is continuing under the care of her neurologist Dr. Carles Collet.  The nortriptyline was increased to 75 to try to reduce tendency to depression in February 2020.  08/02/22 nortriptyline 85, lihtium 0.5 Received 10/26/22= 115.  Reduced nortriptyline to 50 mg HS to protect from  SE including jitteriness and reduced appetite and cognitive dysfunction and increase mirtazapine instead for mood, anxiety, appetite, and sleep  Lithium level goals are between 0.5 and 0.7.  She has a history of lithium toxicity when she was taking chlorthalidone with the lithium.   Discussed signs and symptoms of lithium toxicity.  Discussed the risk of increasing depression if the lithium level gets too low.  She cannot tolerate higher doses of lithium.  Awaiting [facility] to evaluate polyuria.  There remains some concern about drug interactions between paroxetine and nortriptyline and given her age we do not want the nortriptyline levels higher than necessary .  Disc risk polypharmacy  Continue Lithium 300 mg daily  Continue nortriptyline 50 mg daily Continue paroxetine 20 mg daily Continue vitamin B6 and primidone for tremor Increased  mirtazapine 30 mg HS for appetite,  used for sleep and depression.  Appetite better For catatonia: Switch lorazepam to [facility] ER 2 mg AM and use lorazepam 0.5 mg prn. For catatonia in hopes of more  consistent relief. Decision based on observation of periods of almost normalcy after some BZ doses.   Needs more time It is available at the pharmacy today.  We discussed potential switch of paroxetine and nortriptyline to Auvelity or Trintellix or possibly [place].  However these switches will be difficult and it is hoped that the Spravato may provide enough improvement to allow a switch in medicine more tolerable.  Continue Spravato 84 mg twice weekly.  Disc BZ can diminish benefit but catatonia requires BZ  Lynder Parents MD, DFAPA Please see After Visit Summary for patient specific instructions.  Future Appointments  Date Time Provider [place]  12/01/2022  2:00 PM Cottle, Billey Co., MD CP-CP None  12/01/2022  2:00 PM CP-NURSE CP-CP None  12/02/2022 10:00 AM Shanon Ace, LCSW CP-CP None  01/03/2023  3:20 PM [facility] DEVICE REMOTES [facility]  01/18/2023  9:30 AM Mauri Pole, MD [facility]  04/04/2023  3:20 PM [facility] DEVICE REMOTES [facility]  07/04/2023  3:20 PM [facility] DEVICE REMOTES [facility]  10/03/2023  7:00 AM [facility] DEVICE REMOTES [facility]      No orders of the defined types were placed in this encounter.      -------------------------------

## 2022-11-30 NOTE — Telephone Encounter (Signed)
Please address RF as appropriate

## 2022-12-01 ENCOUNTER — Encounter: Payer: Self-pay | Admitting: Psychiatry

## 2022-12-01 ENCOUNTER — Other Ambulatory Visit (HOSPITAL_BASED_OUTPATIENT_CLINIC_OR_DEPARTMENT_OTHER): Payer: Self-pay

## 2022-12-01 ENCOUNTER — Ambulatory Visit (INDEPENDENT_AMBULATORY_CARE_PROVIDER_SITE_OTHER): Payer: Self-pay | Admitting: Psychiatry

## 2022-12-01 ENCOUNTER — Ambulatory Visit: Payer: Medicare Other

## 2022-12-01 ENCOUNTER — Encounter (HOSPITAL_BASED_OUTPATIENT_CLINIC_OR_DEPARTMENT_OTHER): Payer: Medicare Other | Admitting: Physical Therapy

## 2022-12-01 DIAGNOSIS — F061 Catatonic disorder due to known physiological condition: Secondary | ICD-10-CM

## 2022-12-01 DIAGNOSIS — Z79899 Other long term (current) drug therapy: Secondary | ICD-10-CM

## 2022-12-01 DIAGNOSIS — G3184 Mild cognitive impairment, so stated: Secondary | ICD-10-CM

## 2022-12-01 DIAGNOSIS — F411 Generalized anxiety disorder: Secondary | ICD-10-CM

## 2022-12-01 DIAGNOSIS — G251 Drug-induced tremor: Secondary | ICD-10-CM

## 2022-12-01 DIAGNOSIS — R3589 Other polyuria: Secondary | ICD-10-CM

## 2022-12-01 DIAGNOSIS — F314 Bipolar disorder, current episode depressed, severe, without psychotic features: Secondary | ICD-10-CM

## 2022-12-01 NOTE — Progress Notes (Unsigned)
[facility] Check  Patient ID: Meghan Oliver,  MRN: [id number]  PCP: [name], MD  Date of Evaluation: 12/01/2022 Time spent:{TIME; 0 MIN TO 60 [phone]}  Chief Complaint:   HISTORY/CURRENT STATUS: HPI  Individual Medical History/ Review of Systems: Changes? :{EXAM; YES/NO:21197}  Allergies: Propranolol and Brexpiprazole  Current Medications:  Current Outpatient Medications:    amoxicillin (AMOXIL) 500 MG capsule, Take 3 capsules (1,500 mg total) by mouth 1 hour prior to dental work., Disp: 9 capsule, Rfl: 1   aspirin 81 MG chewable tablet, Chew 1 tablet (81 mg total) by mouth daily., Disp: , Rfl:    Calcium Carbonate-Vitamin D (CALCIUM 600/VITAMIN D PO), Take 1 tablet by mouth 2 (two) times daily., Disp: , Rfl:    cetirizine (ZYRTEC) 10 MG tablet, Take 10 mg by mouth daily as needed for allergies., Disp: , Rfl:    Cholecalciferol (VITAMIN D3) 50 MCG (2000 UT) TABS, Take 2,000 Units by mouth in the morning., Disp: , Rfl:    COVID-19 mRNA vaccine 2023-2024 (COMIRNATY) syringe, Inject into the muscle., Disp: 0.3 mL, Rfl: 0   dexlansoprazole (DEXILANT) 60 MG capsule, Take 1 capsule by mouth daily, Disp: 90 capsule, Rfl: 3   Esketamine HCl, 84 MG Dose, (SPRAVATO, 84 MG DOSE,) 28 MG/DEVICE SOPK, Place 84 mg into the nose every 3 (three) days., Disp: 3 each, Rfl: 5   fesoterodine (TOVIAZ) 8 MG TB24 tablet, Take 1 tablet (8 mg total) by mouth daily., Disp: 90 tablet, Rfl: 3   glycerin adult 2 g suppository, Use 1 suppository as needed if you have not had a bowel movement in  4 to 5 days, Disp: 25 suppository, Rfl: 0   influenza vaccine adjuvanted (FLUAD QUADRIVALENT) 0.5 ML injection, Inject into the muscle., Disp: 0.5 mL, Rfl: 0   Krill Oil 500 MG CAPS, Take 500 mg by mouth daily., Disp: , Rfl:    lactulose (CHRONULAC) 10 GM/15ML solution, Take 30 mLs (20 g total) by mouth 3 (three) times daily between meals as needed for mild constipation. (Patient taking differently:  Take 20 g by mouth daily as needed for mild constipation.), Disp: 473 mL, Rfl: 3   Lavender Oil 80 MG CAPS, Take 160 mg by mouth at bedtime., Disp: , Rfl:    levothyroxine (SYNTHROID) 75 MCG tablet, Take 1 tablet (75 mcg total) by mouth daily., Disp: 90 tablet, Rfl: 2   levothyroxine (SYNTHROID) 75 MCG tablet, Take 1 tablet (75 mcg total) by mouth daily., Disp: 90 tablet, Rfl: 2   linaclotide (LINZESS) 72 MCG capsule, Take 2 capsules (144 mcg total) by mouth daily before breakfast., Disp: 180 capsule, Rfl: 1   lithium carbonate 300 MG capsule, Take 1 capsule (300 mg total) by mouth at bedtime., Disp: 90 capsule, Rfl: 0   LORazepam (ATIVAN) 0.5 MG tablet, TAKE 2 TABLETS (1 MG)  BY MOUTH EVERY NIGHT AT BEDTIME AND 1 TABLET EVERY 4 HOURS AS NEEDED FOR ANXIETY (Patient taking differently: Take 0.5 mg by mouth every 6 (six) hours as needed. TAKE 2 TABLETS (1 MG)  BY MOUTH EVERY NIGHT AT BEDTIME AND 1 TABLET EVERY 4 HOURS AS NEEDED FOR ANXIETY), Disp: 270 tablet, Rfl: 0   LORazepam ER (LOREEV XR) 2 MG CS24, Take 1 capsule by mouth every morning. (Patient not taking: Reported on 12/01/2022), Disp: 7 capsule, Rfl: 0   magic mouthwash w/lidocaine SOLN, Take 5 mLs by mouth 3 (three) times daily as needed for mouth pain., Disp: 120 mL, Rfl: 0  memantine (NAMENDA) 10 MG tablet, Take 1 tablet (10 mg total) by mouth 2 (two) times daily., Disp: 180 tablet, Rfl: 1   metoprolol tartrate (LOPRESSOR) 25 MG tablet, Take 1 tablet (25 mg total) by mouth as needed., Disp: 45 tablet, Rfl: 3   Multiple Vitamins-Minerals (MULTIVITAMIN WITH MINERALS) tablet, Take 1 tablet by mouth daily., Disp: , Rfl:    Multiple Vitamins-Minerals (PRESERVISION AREDS 2 PO), Take 1 capsule by mouth in the morning and at bedtime., Disp: , Rfl:    naloxegol oxalate (MOVANTIK) 12.5 MG TABS tablet, Take 2 tablets (25 mg total) by mouth daily. (Patient taking differently: Take 25 mg by mouth daily. PRN), Disp: 60 tablet, Rfl: 3   nortriptyline  (PAMELOR) 25 MG capsule, Take 2 capsules (50 mg total) by mouth at bedtime., Disp: 180 capsule, Rfl: 1   OLANZapine (ZYPREXA) 2.5 MG tablet, Take 1 tablet (2.5 mg total) by mouth at bedtime., Disp: 30 tablet, Rfl: 0   ondansetron (ZOFRAN-ODT) 4 MG disintegrating tablet, Place 1 tablet every 6 hours by translingual route as needed., Disp: 20 tablet, Rfl: 0   PARoxetine (PAXIL) 20 MG tablet, Take 1 tablet (20 mg total) by mouth at bedtime., Disp: 90 tablet, Rfl: 0   pneumococcal 20-valent conjugate vaccine (PREVNAR 20) 0.5 ML injection, Inject into the muscle., Disp: 0.5 mL, Rfl: 0   polyethylene glycol (MIRALAX / GLYCOLAX) 17 g packet, Take 17 g by mouth daily as needed for mild constipation., Disp: , Rfl:    primidone (MYSOLINE) 50 MG tablet, TAKE 2 TABLETS BY MOUTH EVERY MORNING AND 1 TABLET EVERY EVENING, Disp: 270 tablet, Rfl: 0   Pyridoxine HCl (VITAMIN B-6) 500 MG tablet, Take 500 mg by mouth 2 (two) times daily., Disp: , Rfl:    RSV vaccine recomb adjuvanted (AREXVY) 120 MCG/0.5ML injection, Inject into the muscle., Disp: 0.5 mL, Rfl: 0   sennosides-docusate sodium (SENOKOT-S) 8.6-50 MG tablet, Take 2 tablets by mouth as needed., Disp: , Rfl:    valACYclovir (VALTREX) 1000 MG tablet, Take 2 tablets by mouth at onset, then 2 tablets 12 hours later. (Patient taking differently: Take 2,000 mg by mouth every 12 (twelve) hours. PRn), Disp: 30 tablet, Rfl: 1   vitamin B-12 (CYANOCOBALAMIN) 1000 MCG tablet, Take 1,000 mcg by mouth at bedtime., Disp: , Rfl:  Medication Side Effects: {Medication Side Effects (Optional):12147}  Family Medical/ Social History: Changes? {EXAM; YES/NO:19492}  MENTAL HEALTH EXAM:  There were no vitals taken for this visit.There is no height or weight on file to calculate BMI.  General Appearance: {[phone]}  Eye Contact:  {PSY:22684}  Speech:  {PSY:[phone]}  Volume:  {PSY:22686}  Mood:  {PSY:22306}  Affect:  {PSY:[phone]}  Thought Process:  {PSY:22688}   Orientation:  {PSY:22689}  Thought Content: {PSYt:22690}   Suicidal Thoughts:  {PSY:22692}  Homicidal Thoughts:  {PSY:22692}  Memory:  {PSY:[id number]}  Judgement:  {PSY:22694}  Insight:  {PSY:22695}  Psychomotor Activity:  {PSY:22696}  Concentration:  {PSY:21399}  Recall:  {PSY:22877}  Fund of Knowledge: {PSY:22877}  Language: [id number]  Assets:  {PSY:22698}  ADL's:  {PSY:22290}  Cognition: {PSY:[id number]}  Prognosis:  {PSY:22877}    DIAGNOSES: No diagnosis found.  Receiving Psychotherapy: [id number]   RECOMMENDATIONS: ***   Donnal Moat, PA-C

## 2022-12-01 NOTE — Progress Notes (Addendum)
VERNETTE RYBERG YD:[id number] April 04, 1941 82 y.o.  Subjective:   Patient ID:  Meghan Oliver is a 82 y.o. (DOB 08-01-1941) female. Patient was last seen August 21, 2018 Chief Complaint:  Chief Complaint  Patient presents with   Follow-up   Depression   Anxiety   Altered Mental Status     [name] presents to the office today for follow-up of Severe TR bipolar depression with psychotic features including catatonia.  11/30/2018 was the last visit and the following was noted: Had ECT yesterday at [facility].  Last was Jan 8 and was doing well then.  Next scheduled April 15.  Had a lithium level from there which is pending. Pretty good until the last 10 days to 2 weeks with less energy and motivation.  Bothered by old sick dog.  Worried about how that will affect her.  Has slept with the dog.  Enjoyed [place].  Going to gym and playing [place].  Took the class and didn't feel she caught on quickly. Wonders if it is bc of the ECT memory effects.  Bridge is harder and not playing.  Planning to return to [facility] mid April.   spent the winter in [place] per usual..  No significant depression since here. ECT frequency about 6 weeks.  Gets very anxious the day of the ECT.  ECT consistently for a year.  Best in mood in 7-8 years.  Also the pacemaker helped.  Friends notice the benefit.  Plan:  Option increase in the nortriptyline if needed.  Considered this.  They agree and would like to increase nortriptyline to 75 to try to reduce tendency to depression before the ECT.   03/19/19  Addendum: Here the contents from an email from the nurse for Ms. Meghan Oliver  Hello [name],   I hope you and your family are healthy and well. I took [name] to [facility] for ECT and here are labs from 01/26/19. I am going to also forward them to [facility] for follow up with her LFT's. They are still higher than I would like.  Her lithium level was 0.51 on a daily dose of '300mg'$  qohs x 4 nights- alternating with '450mg'$  qohs x 3 nights. She  is not demonstrating any signs of elevated levels, minimal hand tremors, less anxiety and restlessness since her ECT treatment.  Her Nortryptyline level was 140 on '75mg'$  qhs.  I would like to continue her current dosages of both the above meds and recheck her lithium level at her next scheduled ECT of 03/23/19.  Please let me know of any changes you would like to make. Take care, [name] RN Lithium level was 0.4 on the '450mg'$  alt with 300 mg QOD. No changes were made in meds.  11/15/2019 phone call: Telephone call from patient's husband [name] on 11/14/2019  Patient is scheduled for ECT soon for maintenance ECT.  He thinks it is been 2-1/2 months since the last ECT but he is going to check the date for sure.  It is still be done being done at [facility].  He is wondering about skipping this treatment because the patient has continued to be free of depression and seems cognitively clearer as these ECT treatments have been spread out further from each other.  Patient remains on medications as prescribed.  If it is truly been over 2 months since the last ECT then it is reasonable to consider discontinuing ECT.  It is unlikely that ECT with the frequency of  greater than 2 months is significantly helpful at preventing her relapse.  If however the ECT frequency is less than every 2 months it could still be helping to prevent recurrence.  He indicated he would consider this information and discuss it with her ECT team and make a decision.  They are heading to [place] in the next few weeks.  Needs to schedule an appointment with me for follow-up because it is been many months.  He agrees.   02/19/2020 appt, the following noted: Moving to [place] in a few months.  Ready to downsize.   Stopped ECT as discussed and has not been more deprressed.   Walks dog 4 times daily.   Lithium 0.7 on  01/30/20 on lithium 300 mg plus 150 mg on M, W, F Nortriptyline 70 on 75 mg HS. No SE except tremor.  Balance is  not great.  Very happy.   No depression since here.  No mood swings.  Sleep and appetite is OK.  No med changes:  04/03/2020 TC with the following noted: Patient's husband called stating that her tremor was a little worse and she was having some stutter. No evidence of stroke otherwise. First thing would be to check serum lithium level and BMP.  Order written.  Have asked her husband to let us know about the lab to which it should be sent.  04/22/20 TC witht he following noted: Lithium level is not dangerously high but it is has crept up to 1.0 which is higher than desired for her.  Our goal for her is 0.5-0.7.  At the current level it is likely causing side effects so we should reduce the dosage from lithium 300 mg plus 150 mg on M, W, F  TO 300 mg daily.  Meaning drop off the 150 mg capsule.  It will take about 2 weeks for the level to gradually come down to the desired level.  05/08/2020 appt with the following noted: Seen with H and [name] nurse. Moving to [facility] and feels OK about it.  I think I'll be fine.   Pretty good with balance.  Doing yoga and it helps. [name] has cancer. Old dog 23 & 1/2 yo still living. [name]'s concerns when went to [place] for D's BD she had difficulty time with tremor lethargy, more confusion.  Better when go back home. [name] notes using more lorazepam. At times gait is unsteady. Not significantly depressed.  Can be anxious at times.  Eating okay.  Sleeping okay.  Is easily confused under stress. Plan: reduce lithium to 300 mg nightly.  07/14/20 appt with the following noted: Seen with her husband as well as her family nurse. Feeling fine and pleased with that.   Some anxiety over the move to smaller place. [name]'s twin also having heart problems Gene. [name]'s kids came and visited.    Plan: Continue nortriptyline 75 mg, paroxetine 20 mg, lithium 300 mg, Namenda 10 mg twice daily, lorazepam 0.5 mg 1/2-1 3 times daily as needed, mirtazapine 7.5 mg  nightly  02/27/2021 appointment with the following noted:  Seen with H and nurse [place] email from nurse [name] on 02/24/2021 indicated that he had moved into [facility] in May.  Patresa has gradually become more depressed and more anxious using lorazepam as prescribed 3 times daily.  More memory issues.  She has remained engaged and initiating appropriate conversation.  She is very worried about her 56 year old dog who is in poor health and fears  she will become more depressed when the dog dies.  She and her husband do not wish to prefer to pursue further ECT unless absolutely necessary. Likes [facility] so far.   No depression by her report.  Sold big house in Feb.  It worked out well.   Worries about her dog 72 yo will die soon. H agrees she's done well with depression.   Enjoys things and has interests. Plan: Continue Lithium 300 mg daily  Continue nortriptyline 75 mg daily Continue paroxetine 20 mg daily Continue lorazepam 0.5 mg 3 times daily for both anxiety and catatonia Continue vitamin B6 and primidone for tremor Cerefolin NAC 2 daily.  05/13/2021 appointment with the following noted: Happy with [place].  H and she agree is doing well with depression.Kermit Balo socialization. She complains of memory.   Family visited and getting along with Baker Janus better.  H says Baker Janus is acting better.  Goes to dinner and activities together now.  Better relationship with Gene.. Tremor has been OK.   Ativan usually 0.25 mg TID and tolerated. Anxiety managed with this generally. Molly dog health more stable. To [place] before T'giving until April.   Plan: Continue Lithium 300 mg daily  Continue nortriptyline 75 mg daily Continue paroxetine 20 mg daily Continue lorazepam 0.5 mg 3 times daily for both anxiety and catatonia Continue vitamin B6 and primidone for tremor   08/14/2021 appointment with the following noted: Seen with her husband [name] and nurse [name]. Everyone reports that  her mood has been stable.  Her anxiety is manageable with as needed lorazepam.  She is still happy with the transition to [facility].  They are preparing to go to [place] for the winter per usual but may sell their house father there. Tolerating meds without any unusual side effects. Is dealing with grief over the loss of her dog Molly.  03/10/2022 appt noted:  seen with H and nurse [facility] house. [name] not good phsyically hard to play golf.  Been really hard. He'll be 85 in July.  Some stress scheduling with D's.   51 th wedding necessary this month.   Mood up and down.  Sister in law across the street can be difficult and demeaning.  She feels unsettled.  Some worry. Some back pain limiting activity.   Sleep is ok except with pain awakening.   No SE, except tremor some worse about 3-5 and helped by lorazepam 0.25 mg then No greater than 1 mg lorazepam daily Patient denies any recent difficulty with anxiety except as noted.  Patient denies difficulty with sleep initiation or maintenance. Denies appetite disturbance.    Patient has some difficulty with concentration.  Patient denies any suicidal ideation.  Usually takes Ativan at night and sleeps well 8 hours.   Plan no med changes  10/23/213 appt noted: Still doing well. Pneumonia and hosp for 6 days 3 weeks ago.  Viral.  Improving at this time. Increased tremor gradually in 3-4 mos helped by lorazepam avg 1.00-1.25 mg daily. No change in primidone. Total R hip August 2023 and done well.  Finishing home PT.   Appetite poor for 7-8 mos.  2 protein drinks daily but may not eat more than 1 good meal a day. [facility]. Substantial memory problems in hospital and in unusual enviornments. Nurse notices decline.  B with LBD passed in August. No pain meds or muscle relaxants beyond pain. No depression.  H note sometimes she has less motivation than others. [name] notes some normal  waxing and waning of mood. Good social interaction.   Not  spending winter in [facility] this year.  D still lives there. Plan: Increase mirtazapine 15 mg HS for appetite.  Also used for sleep and depression.  09/23/22 appt noted:  with nurse and [name] [name] feeling bad with more pain and fatigue.  [name] has CHF.   Made Jnaya sad.  Lost a cgood friends.  Rough time.  Mostly worry about [name].  She feels afraid to be alone if something happens.   Not sig depressed.    Just worry . H can't be as active.  Taking a shower is hard.   She's back exercising again.   Went to [employer] for Thanksgiving in [facility] and plan to go back for Christmas. It's hard to travel. Told nurse she was depressed. Appetite is better and once asleep is OK. Using more lorazepam for anxiety 3-4 daily. Plan: Continue Lithium 300 mg daily  Continue nortriptyline 75 mg daily Continue paroxetine 20 mg daily Continue lorazepam 0.5 mg 3 times daily for both anxiety and catatonia Continue vitamin B6 and primidone for tremor Increased and it helped mirtazapine 15 mg HS for appetite.  Also used for sleep and depression.  10/19/22  RTC  [name] reports not doing well.  Went to [facility].  Recent pneumonia.  They are thinking this is aspiration pneurmonia. Lethargic and anxious with tremor. Ruminating.  Hot to cold .  Low tolerance.  Going downhill quickly.  She's verbalizing depression and desire to isolate.  Won't go out.  Started within the last 2 weeks. Usually doesn't complain when she's sick.   [name]'s biggest concern is that Inocente Salles is having trouble handling this bc she's up at night rambling and not really safe to be up at night.    Will see her 10/22/22 Get lithium and nortriptyline levels.   Lynder Parents, MD, [facility]     10/22/22 emergency appt with nurse [name] and H [name]: Pneumonia Oct and then again 2nd week January.  It appears to have largely resoved.  It's really bad.  Nausea. Anxious bc [name] ill.  Doesn't want him to leave.  [name] sees anxiety triggering anxiety with little to no appetite.  Hypersensitive to  stimuli like light and noises.  Doesn't want TV on.  [name] says totally reversed in last 6 weeks.    [name] reports she's overemotional.  Friends died. Losing wt 91.4# today. More trouble with sleep. Lorazepam increased to 2 mg daily.  Not sleepy with it.  Doesn't seem to be helpful. Last week neglect hygiene but better this week a little. [name] says STM is poor at times and at times disoriented if in [place].  Will walk in bathroom and wonder where sh is. Barium swallow pending for evaluation of refulex.    10/26/22 RTC:  RTC   No improvement since Friday.  Gradually less engaged.  Can be overstimulated with visitors.   Disc nortriptyline level 115 is a little higher than it was and higher levels can cause SE in elderly.   Reduce nortriptyline to 50 mg HS. Taking lorazepam    Consider low dose of Vraylar or pramipexole.  Or Spravato.     Lynder Parents, MD, [facility]     11/15/22 appt noted: Dialogue with Dr. Aundra Dubin cardiologist agreed pt should be stable enough from CV perspective to receive Spravato which could elevate BP.  If that occurs use prn metoprolol 25 mg. Disc in detail with Dr. Velora Heckler and he agrees Ebony Cargo is appropriate  though not FDA approved for bipolar depression.  Disc 2 metanalysis in support of this and this alernative is safer and likely better tolerated than the option of ECT and faster than options of trials of Trintellix, Auvelity, Latuda etc. Tolerating meds.  She is depressed and very anxious and intermittently confused.  Taking clothes on and off bc rapid changes in comfort.  Worried over [employer].  Sleep disrupted appetite some better with mirtazapine. Received Spravato 56 mg first time today and was dissociated.  Resolved over 2 hours.  No HA, NV.  Was distressed moderately and very ambivalent about Tx.  H and nurse supportive of tx plan and for the pt.    11/24/22 appt noted: Current meds: nortriptyline 50 mg HS, fluoxetine 20 mg daily, lithium 300 mg nightly, mirtazapine  30 mg nightly, memantine 10 mg twice daily.  Lorazepam 0.5 mg every 4 to 6 hours as needed catatonia or severe anxiety. Received first dose of Spravato 84 mg today.  She was more noticeably dissociated than with the 56 mg dose.  It did resolve over the 2-hour course of observation although she remained somewhat unsteady and needed to leave via wheelchair.  She did not have headache nausea or vomiting.  She remains ambivalent about the treatment. Husband notes she has continued to be anxious and depressed and was extremely anxious yesterday and somewhat confused.  At times would say things that did not make sense.  He remained supportive of the treatment while she remains ambivalent about the treatment.  Her appetite is good and she is eating well.  Sleep is variable.  H reported at times will be almost normal after receiving an extra dose of lorazepam 0.5 mg prn but this only lasts an hour or so. Plan: Continue Lithium 300 mg daily  Continue nortriptyline 50 mg daily Continue paroxetine 20 mg daily Continue vitamin B6 and primidone for tremor Imirtazapine 30 mg HS for appetite,  used for sleep and depression.  Appetite better Switch lorazepam to [facility] ER 2 mg AM and use lorazepam 0.5 mg prn.  For catatonia in hopes of more consistent relief.  Decision based on observation of periods of almost normalcy after some BZ doses.  We discussed potential switch of paroxetine and nortriptyline to Auvelity or Trintellix or possibly [place].  However these switches will be difficult and it is hoped that the Spravato may provide enough improvement to allow a switch in medicine more tolerable.  11/26/22 appt noted: Current meds: nortriptyline 50 mg HS, fluoxetine 20 mg daily, lithium 300 mg nightly, mirtazapine 30 mg nightly, memantine 10 mg twice daily.  Lorazepam 0.5 mg every 4 to 6 hours as needed catatonia or severe anxiety.  Started Loreev 2 mg daily over weekend Received first dose of Spravato 84 mg today.    Spravato with expected dissociation.  She didn't experience relief from negative emotion.  Dissociation resolved over observation period.  No N, V, HA.   Still high anxiety and dread of most everything.  Depressed with negative thought predominates.  H notes periods of confusion and then maybe an hour of clarity sometimes after the dosing of lorazepam.  Then back to severe sx.  Eating is fair. No SI.  Fearful generally.  Needs help with ADLs.  Cannot take meds on her own. Tolerating meds without SE No noticeable effect from Loreev in terms of benefit or SE  12/01/22 appt noted: Received a phone call from nurse [name] this morning to call as soon as possible  this patient very agitated and confused.  Thrashing around in bed.  At times talking about dying out of the fear that she was going to die.  Highly anxious.  Not very cooperative with drinking water or taking medicines this morning. Discussed the situation with her husband as well and we have to options: 1's if we can get her to the office to receive Spravato is scheduled today that may provide some immediate relief and forestall and may be prevent hospitalization.  The second option is to proceed directly to hospitalization.  If she is unable to take nutrition and hydration hospitalization will be necessary. Discussed that appeared to be catatonic hyperactivity and agitation which has not responded to the extended release lorazepam 2 mg a day.  Therefore she was given 1 mg of Ativan this morning and did calm down enough to make it to the Spravato administration this afternoon.  She was able to cooperate with Spravato administration and tolerated it well with typical levels of dissociation which gradually resolved over the 2 hours.  She was able to eat and drink fluids prior to coming to the appointment. Current meds: nortriptyline 50 mg HS, fluoxetine 20 mg daily, lithium 300 mg nightly, mirtazapine 30 mg nightly, memantine 10 mg twice daily.  Lorazepam  0.5 mg every 4 to 6 hours as needed catatonia or severe anxiety.  Started Loreev 2 mg daily over weekend Received first dose of Spravato 84 mg today.   Husband notes that after Spravato administration on Monday she was much improved for a period of several hours and was able to have conversations with her family members and make decisions about distributing some of her jewelry and other family decisions.  However over Monday evening into Tuesday she became more agitated and confused and anxious again.  There have been no obvious effects of taking the olanzapine but it does not appear that long-acting lorazepam 2 mg as provided any significant relief.  No current alcohol problems  Saw Dr. Carles Collet last week and no change in neuro meds.  Past Psychiatric Medication Trials: Prozac with lithium and Zyprexa 2.5 to 5 mg,  nortriptyline, paroxetine, ,  pramipexole, mirtazapine,  Rexulti, Abilify, Vraylar, Latuda 10 mg,  lamotrigine.   Namenda,  lorazepam,   this loss list is not exhaustive  Review of Systems:  Review of Systems  Constitutional:  Positive for appetite change and fatigue.  Respiratory:  Negative for shortness of breath.   Cardiovascular:  Negative for palpitations.  Gastrointestinal:  Positive for constipation and nausea.       Linzess and Moz[place] managed   Musculoskeletal:  Positive for gait problem.  Neurological:  Positive for dizziness and weakness. Negative for tremors.       Some balance issues  Psychiatric/Behavioral:  Positive for agitation, confusion, decreased concentration and dysphoric mood. Negative for behavioral problems, hallucinations, self-injury, sleep disturbance and suicidal ideas. The patient is nervous/anxious. The patient is not hyperactive.     Medications: I have reviewed the patient's current medications.  Current Outpatient Medications  Medication Sig Dispense Refill   amoxicillin (AMOXIL) 500 MG capsule Take 3 capsules (1,500 mg total) by mouth 1  hour prior to dental work. 9 capsule 1   aspirin 81 MG chewable tablet Chew 1 tablet (81 mg total) by mouth daily.     Calcium Carbonate-Vitamin D (CALCIUM 600/VITAMIN D PO) Take 1 tablet by mouth 2 (two) times daily.     cetirizine (ZYRTEC) 10 MG tablet Take 10 mg by mouth  daily as needed for allergies.     Cholecalciferol (VITAMIN D3) 50 MCG (2000 UT) TABS Take 2,000 Units by mouth in the morning.     COVID-19 mRNA vaccine 2023-2024 (COMIRNATY) syringe Inject into the muscle. 0.3 mL 0   dexlansoprazole (DEXILANT) 60 MG capsule Take 1 capsule by mouth daily 90 capsule 3   Esketamine HCl, 84 MG Dose, (SPRAVATO, 84 MG DOSE,) 28 MG/DEVICE SOPK Place 84 mg into the nose every 3 (three) days. 3 each 5   fesoterodine (TOVIAZ) 8 MG TB24 tablet Take 1 tablet (8 mg total) by mouth daily. 90 tablet 3   glycerin adult 2 g suppository Use 1 suppository as needed if you have not had a bowel movement in  4 to 5 days 25 suppository 0   influenza vaccine adjuvanted (FLUAD QUADRIVALENT) 0.5 ML injection Inject into the muscle. 0.5 mL 0   Krill Oil 500 MG CAPS Take 500 mg by mouth daily.     lactulose (CHRONULAC) 10 GM/15ML solution Take 30 mLs (20 g total) by mouth 3 (three) times daily between meals as needed for mild constipation. (Patient taking differently: Take 20 g by mouth daily as needed for mild constipation.) 473 mL 3   Lavender Oil 80 MG CAPS Take 160 mg by mouth at bedtime.     levothyroxine (SYNTHROID) 75 MCG tablet Take 1 tablet (75 mcg total) by mouth daily. 90 tablet 2   levothyroxine (SYNTHROID) 75 MCG tablet Take 1 tablet (75 mcg total) by mouth daily. 90 tablet 2   linaclotide (LINZESS) 72 MCG capsule Take 2 capsules (144 mcg total) by mouth daily before breakfast. 180 capsule 1   lithium carbonate 300 MG capsule Take 1 capsule (300 mg total) by mouth at bedtime. 90 capsule 0   LORazepam (ATIVAN) 0.5 MG tablet TAKE 2 TABLETS (1 MG)  BY MOUTH EVERY NIGHT AT BEDTIME AND 1 TABLET EVERY 4 HOURS AS  NEEDED FOR ANXIETY (Patient taking differently: Take 0.5 mg by mouth every 6 (six) hours as needed. TAKE 2 TABLETS (1 MG)  BY MOUTH EVERY NIGHT AT BEDTIME AND 1 TABLET EVERY 4 HOURS AS NEEDED FOR ANXIETY) 270 tablet 0   magic mouthwash w/lidocaine SOLN Take 5 mLs by mouth 3 (three) times daily as needed for mouth pain. 120 mL 0   memantine (NAMENDA) 10 MG tablet Take 1 tablet (10 mg total) by mouth 2 (two) times daily. 180 tablet 1   metoprolol tartrate (LOPRESSOR) 25 MG tablet Take 1 tablet (25 mg total) by mouth as needed. 45 tablet 3   Multiple Vitamins-Minerals (MULTIVITAMIN WITH MINERALS) tablet Take 1 tablet by mouth daily.     Multiple Vitamins-Minerals (PRESERVISION AREDS 2 PO) Take 1 capsule by mouth in the morning and at bedtime.     naloxegol oxalate (MOVANTIK) 12.5 MG TABS tablet Take 2 tablets (25 mg total) by mouth daily. (Patient taking differently: Take 25 mg by mouth daily. PRN) 60 tablet 3   nortriptyline (PAMELOR) 25 MG capsule Take 2 capsules (50 mg total) by mouth at bedtime. 180 capsule 1   OLANZapine (ZYPREXA) 2.5 MG tablet Take 1 tablet (2.5 mg total) by mouth at bedtime. 30 tablet 0   ondansetron (ZOFRAN-ODT) 4 MG disintegrating tablet Place 1 tablet every 6 hours by translingual route as needed. 20 tablet 0   PARoxetine (PAXIL) 20 MG tablet Take 1 tablet (20 mg total) by mouth at bedtime. 90 tablet 0   pneumococcal 20-valent conjugate vaccine (PREVNAR 20) 0.5 ML injection  Inject into the muscle. 0.5 mL 0   polyethylene glycol (MIRALAX / GLYCOLAX) 17 g packet Take 17 g by mouth daily as needed for mild constipation.     primidone (MYSOLINE) 50 MG tablet TAKE 2 TABLETS BY MOUTH EVERY MORNING AND 1 TABLET EVERY EVENING 270 tablet 0   Pyridoxine HCl (VITAMIN B-6) 500 MG tablet Take 500 mg by mouth 2 (two) times daily.     RSV vaccine recomb adjuvanted (AREXVY) 120 MCG/0.5ML injection Inject into the muscle. 0.5 mL 0   sennosides-docusate sodium (SENOKOT-S) 8.6-50 MG tablet Take  2 tablets by mouth as needed.     valACYclovir (VALTREX) 1000 MG tablet Take 2 tablets by mouth at onset, then 2 tablets 12 hours later. (Patient taking differently: Take 2,000 mg by mouth every 12 (twelve) hours. PRn) 30 tablet 1   vitamin B-12 (CYANOCOBALAMIN) 1000 MCG tablet Take 1,000 mcg by mouth at bedtime.     LORazepam ER (LOREEV XR) 2 MG CS24 Take 1 capsule by mouth every morning. (Patient not taking: Reported on 12/01/2022) 7 capsule 0   No current facility-administered medications for this visit.    Medication Side Effects: Other: tremor  stable.  No worse.  Allergies:  Allergies  Allergen Reactions   Propranolol Other (See Comments)    Low blood pressure. Bradycardia    Brexpiprazole Other (See Comments)    Aphasia and catatonia    Past Medical History:  Diagnosis Date   Anxiety    Arthritis    "hips, spine" (03/17/2018)   Bipolar II disorder (HCC)    CHF (congestive heart failure) (HCC)    Chronic bronchitis (HCC)    Chronic lower back pain    Chronic right hip pain    CKD (chronic kidney disease), stage II    Coronary artery disease    stent x1   Esophagitis, erosive    GAD (generalized anxiety disorder)    GERD (gastroesophageal reflux disease)    Headache    "maybe monthly" (03/17/2018))   Heart murmur, systolic    History of adenomatous polyp of colon    08-04-2016  tubular adenoma   History of blood transfusion 12/2017   "related to vascular hematoma"   History of electroconvulsive therapy    at [facility]--  started 04-15-2015 to 11-17-2016  total greater than 40 times   History of hiatal hernia    Hyperlipidemia    Hypertension    Hypothyroidism    Internal carotid artery stenosis, bilateral    per last duplex 05-01-2014  bilateral ICA 40-59%   Major depression, chronic    ECT treatments extensive and multiple started 07/ 2016   Memory loss    "both short and long-term; needs frequent reminders to follow instrucitons" (05/16/2017)   Migraines    "none  in years" (03/17/2018)   OSA (obstructive sleep apnea)    per study 06/ 2012 moderate OSA  ; "refuses to wear masks" (03/17/2018)   Osteoporosis    Pneumonia 07/29/2022   Poor historian    due to short term memory loss   Presence of permanent cardiac pacemaker 03/17/2018   Pulmonary nodule    monitored by pcp   S/P placement of cardiac pacemaker 03/17/18 [facility]  03/18/2018   Short-term memory loss    Sick sinus syndrome (HCC)     Family History  Problem Relation Age of Onset   Heart attack Father 76       deceased   Hypertension Father    Heart  disease Father    Dementia Brother    Parkinson's disease Brother    Heart disease Brother    Breast cancer Paternal Aunt        Age 36's   Breast cancer Paternal Grandmother        Age unknown   Colon cancer Neg Hx     Social History   Socioeconomic History   Marital status: Married    Spouse name: Dr. Lyla Son   Number of children: 2   Years of education: Not on file   Highest education level: Not on file  Occupational History   Occupation: housewife    Employer: UNEMPLOYED  Tobacco Use   Smoking status: Former    Packs/day: 2.00    Years: 15.00    Total pack years: 30.00    Types: Cigarettes    Quit date: 01/13/1971    Years since quitting: 51.9    Passive exposure: Never   Smokeless tobacco: Never  Vaping Use   Vaping Use: Never used  Substance and Sexual Activity   Alcohol use: Yes    Comment: occassional 1 x a week   Drug use: Never   Sexual activity: Not Currently    Comment: intercourse age 37, sexual partners less than 5  Other Topics Concern   Not on file  Social History Narrative   Right handed   Social Determinants of Health   Financial Resource Strain: Not on file  Food Insecurity: No Food Insecurity (07/06/2022)   Hunger Vital Sign    Worried About Running Out of Food in the Last Year: Never true    Ran Out of Food in the Last Year: Never true  Transportation Needs: No Transportation Needs  (07/06/2022)   PRAPARE - Hydrologist (Medical): No    Lack of Transportation (Non-Medical): No  Physical Activity: Not on file  Stress: Not on file  Social Connections: Not on file  Intimate Partner Violence: Not At Risk (07/06/2022)   Humiliation, Afraid, Rape, and Kick questionnaire    Fear of Current or Ex-Partner: No    Emotionally Abused: No    Physically Abused: No    Sexually Abused: No    Past Medical History, Surgical history, Social history, and Family history were reviewed and updated as appropriate.   Please see review of systems for further details on the patient's review from today.   Objective:   Physical Exam:  LMP  (LMP Unknown)   Physical Exam Constitutional:      General: She is not in acute distress.    Appearance: She is well-developed.  Musculoskeletal:        General: No deformity.  Neurological:     Mental Status: She is alert and oriented to person, place, and time.     Motor: No tremor.     Coordination: Coordination abnormal.     Comments: Cane use  Psychiatric:        Attention and Perception: She is attentive.        Mood and Affect: Mood is anxious and depressed. Affect is not labile, blunt, tearful or inappropriate.        Speech: Speech is not slurred.        Behavior: Behavior normal. Behavior is not slowed or withdrawn.        Thought Content: Thought content normal. Thought content is not delusional. Thought content does not include homicidal or suicidal ideation. Thought content does not include suicidal plan.  Cognition and Memory: She exhibits impaired recent memory.        Judgment: Judgment normal.     Comments: Insight fair to good. No auditory or visual hallucinations.  Depression and anxiety are worse lately.  Ruminative but better after Spravato Gait ok. Memory appears stable to maybe a little worse.     Lab Review:     Component Value Date/Time   NA 137 11/11/2022 1106   NA 136  04/20/2017 1151   K 4.6 11/11/2022 1106   CL 106 11/11/2022 1106   CO2 23 11/11/2022 1106   GLUCOSE 97 11/11/2022 1106   BUN 24 11/11/2022 1106   BUN 24 04/20/2017 1151   CREATININE 0.73 11/11/2022 1106   CALCIUM 9.4 11/11/2022 1106   PROT 6.9 11/11/2022 1106   PROT 6.2 04/20/2017 1151   ALBUMIN 3.9 09/02/2022 1221   ALBUMIN 3.9 04/20/2017 1151   AST 44 (H) 11/11/2022 1106   ALT 86 (H) 11/11/2022 1106   ALKPHOS 64 09/02/2022 1221   BILITOT 0.2 11/11/2022 1106   BILITOT <0.2 04/20/2017 1151   GFRNONAA >60 07/08/2022 0429   GFRNONAA 50 (L) 07/10/2020 1021   GFRAA 58 (L) 07/10/2020 1021       Component Value Date/Time   WBC 6.6 11/11/2022 1106   RBC 3.88 11/11/2022 1106   HGB 12.1 11/11/2022 1106   HGB 10.2 (L) 04/20/2017 1151   HCT 36.3 11/11/2022 1106   HCT 29.6 (L) 04/20/2017 1151   PLT 215 11/11/2022 1106   PLT 385 (H) 04/20/2017 1151   MCV 93.6 11/11/2022 1106   MCV 89 04/20/2017 1151   MCH 31.2 11/11/2022 1106   MCHC 33.3 11/11/2022 1106   RDW 13.7 11/11/2022 1106   RDW 13.9 04/20/2017 1151   LYMPHSABS 1,439 11/11/2022 1106   LYMPHSABS 1.5 04/20/2017 1151   MONOABS 0.7 07/07/2022 0458   EOSABS 112 11/11/2022 1106   EOSABS 0.3 04/20/2017 1151   BASOSABS 53 11/11/2022 1106   BASOSABS 0.0 04/20/2017 1151    Lithium Lvl  Date Value Ref Range Status  10/21/2022 0.5 (L) 0.6 - 1.2 mmol/L Final  10/21/2022 lithium level 0.5 on 300 mg nightly is stable.,  Nortriptyline 115 on 75 mg HS.  08/02/22 nortriptyline 85, lihtium 0.5  Nortriptyline level 51 on 50 mg nightly with paroxetine 20 mg daily  07/10/2020 lithium level 0.6 at [facility] office and nortriptyline level 130 on the current dosages of lithium 300 mg nightly and nortriptyline 75 mg nightly  02/20/2021 labs lithium 0.6 on 300 mg nightly.  Nortriptyline level 49 which is lower than expected on 75 mg nightly along with paroxetine 20 mg daily.    No results found for: "PHENYTOIN", "PHENOBARB",  "VALPROATE", "CBMZ"   .res Assessment: Plan:    Severe bipolar I disorder, current or most recent episode depressed, with catatonia (HCC)  Generalized anxiety disorder  Mild cognitive impairment  Polyuria  Lithium-induced tremor  Lithium use    History of lithium toxicity repeatedly.  Hx TX resistant bipolar depression with catatonia.  Recent decompensation and rapidly getting worse with some cognive impairment and mild psychotic sx and altered mental status.Depression and anxiety much worse in the last several weeks.  Her case has been highly complex and treatment resistant at times. Disc pros and cons of return to ECT.  She is strongly opposed at this time.   Dialogue with Dr. Aundra Dubin cardiologist agreed pt should be stable enough from CV perspective to receive Spravato which could elevate BP.  If that occurs use prn metoprolol 25 mg. Disc in detail with Dr. Velora Heckler and he agrees Ebony Cargo is appropriate though not FDA approved for bipolar depression.  Disc 2 metanalysis in support of this and this alernative is safer and likely better tolerated than the option of ECT and faster than options of trials of Trintellix, Auvelity, Latuda etc.  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  During the severe depression she has catatonia.  She had required longterm ECT.  A previous episode of depression approximately 10 years ago stayed in remission on Paxil and nortriptyline and lithium for a period of years until the lithium level was decreased due to tremor.  Now we are maintaining an adequate lithium level.  She has had several episodes of lithium toxicity with hospitalization.  This resolved once she stopped chlorthalidone.  From records it appears her last ECT was in September 2020.    Patient was administered Spravato 84 mg intranasally today.  The patient experienced the typical dissociation which gradually resolved over the 2-hour period of  observation.  There were no complications.  Specifically the patient did not have nausea or vomiting or headache.  Blood pressures did go up at 40-minute and 2-hour follow-up intervals.  By the time the 2-hour observation period was met the patient was alert and oriented and able to exit without assistance.  Patient feels the Spravato administration is helpful for the treatment resistant depression and would like to continue the treatment.  See nursing note for further details. She received the metoprolol 25 mg prn before starting Spravato  Will review the old chart and consider augmentation with very low-dose Vraylar or perhaps pramipexole  She seems to be tolerating the meds well other than the lithium tremor which is being fairly  managed with primidone.  She is continuing under the care of her neurologist Dr. Carles Collet.  The nortriptyline was increased to 75 to try to reduce tendency to depression in February 2020.  08/02/22 nortriptyline 85, lihtium 0.5 Received 10/26/22= 115.  Reduced nortriptyline to 50 mg HS to protect from SE including jitteriness and reduced appetite and cognitive dysfunction and increase mirtazapine instead for mood, anxiety, appetite, and sleep  Lithium level goals are between 0.5 and 0.7.  She has a history of lithium toxicity when she was taking chlorthalidone with the lithium.   Discussed signs and symptoms of lithium toxicity.  Discussed the risk of increasing depression if the lithium level gets too low.  She cannot tolerate higher doses of lithium.  Awaiting EUMO to evaluate polyuria.  Get this and UA ASAP to RO UTI.  She had one in Jan 2024  There remains some concern about drug interactions between paroxetine and nortriptyline and given her age we do not want the nortriptyline levels higher than necessary .  Disc risk polypharmacy  Continue Lithium 300 mg daily  Continue nortriptyline 50 mg daily Continue paroxetine 20 mg daily Continue vitamin B6 and primidone  for tremor DC mirtazapine For catatonia: lorazepam0.5 mg q 4 hour mg daily as tolerated for catatonia with dose spread through the day..  Nurse manages. Restart Loreeve 2 mg daily and use prn lorazepam for catatonia Increase olanzapine 5 mg HS for TRD, anxiety, catatonia, delusions of persecution  We discussed potential switch of paroxetine and nortriptyline to Auvelity or Trintellix or possibly [place].  However these switches will be difficult and it is hoped that the Spravato may provide enough improvement to allow a switch in  medicine more tolerable.  Continue Spravato 84 mg twice weekly.  Disc BZ can diminish benefit but catatonia requires BZ  Doing the above to try to keep her out of hospital but disc option return to ECT.  She does not want this.  Lynder Parents MD, DFAPA Please see After Visit Summary for patient specific instructions.  Future Appointments  Date Time Provider [place]  12/02/2022 10:00 AM Shanon Ace, LCSW CP-[facility] None  12/06/2022 11:00 AM Cottle, Billey Co., MD CP-CP None  12/06/2022 11:00 AM CP-NURSE CP-CP None  01/03/2023  3:20 PM [facility] DEVICE REMOTES [facility]  01/18/2023  9:30 AM Mauri Pole, MD [facility]  04/04/2023  3:20 PM [facility] DEVICE REMOTES [facility]  07/04/2023  3:20 PM [facility] DEVICE REMOTES [facility]  10/03/2023  7:00 AM [facility] DEVICE REMOTES [facility]      No orders of the defined types were placed in this encounter.      -------------------------------

## 2022-12-02 ENCOUNTER — Ambulatory Visit (INDEPENDENT_AMBULATORY_CARE_PROVIDER_SITE_OTHER): Payer: Medicare Other | Admitting: Psychiatry

## 2022-12-02 ENCOUNTER — Encounter: Payer: Self-pay | Admitting: Physician Assistant

## 2022-12-02 DIAGNOSIS — F314 Bipolar disorder, current episode depressed, severe, without psychotic features: Secondary | ICD-10-CM | POA: Diagnosis not present

## 2022-12-02 DIAGNOSIS — F061 Catatonic disorder due to known physiological condition: Secondary | ICD-10-CM | POA: Diagnosis not present

## 2022-12-02 NOTE — Progress Notes (Signed)
NURSES NOTE:     Patient arrived for her 3rd total Spravato treatment. Pt is being treated for Treatment Resistant Depression, the starting dose is 56 mg (2 of the 28 mg) nasal sprays and today she continues that same dose. Pt is under Dr. Casimiro Needle care, that is who will follow her treatment with Spravato as well. Although [name] is out of town today so Donnal Moat, PA-C will be checking on her today. She is accompanied today with her nurse, [name] and husband, Dr. Lyla Son. I did go over things again with them all as well. I answered any questions and concerns the patient or anyone had. Pt's Spravato is ordered through [facility] and the pt pays out of pocket with them.  All Spravato medication is stored at doctors office per REMS/FDA guidelines. The medication is required to be locked behind two doors per FDA/REMS Protocol. Medication is also disposed of properly per regulations.      Beginning at 2:30 PM, 164/91, pulse 61, Pulse Ox 98%. Instructed patient to blow her nose then recline back as well as she could. Pt was assisted by [name] due to pt's age and shakiness with holding the nasal spray, administered in each nostril as directed and waited 5 more minutes for the second. After all doses given her nurse and husband stayed with her. No complaints voiced, I checked pt's 40 minute vitals at 3:10 PM, 173/82, pulse 60. Pt received Ativan and Metoprolol prior to arrival to office for treatment.  Explained she would be monitored for a total time of 120 minutes. Discharge vitals were taken at 4:30 PM 143/76, P 73.  Pt remains irritable about receiving treatments and feels like a failure today because it didn't work like it did her 1st treatment. I explained she will not always get the same reaction at each visit, even if it's the same dose. Recommend she go home and sleep or just relax. Verbalized understanding from all of them. Pt. will be receiving 2 treatments per week for 4 weeks as recommended. She  will get her next treatment of 84 mg on Wednesday, February 21st. Nurse was with pt a total of 60 minutes for clinical assessment. Pt instructed to call office with any problems or questions before she returns.      [id number] EXP MAY 2025

## 2022-12-02 NOTE — Progress Notes (Signed)
[facility] Counselor/Therapist Progress Note  Patient ID: Meghan Oliver, MRN: [id number],    Date: 12/02/2022  Time Spent: 45 minutes   Treatment Type: Individual Therapy  Reported Symptoms:   Anxiety, depression, mood fluctuations  Mental Status Exam:  Appearance:   Casual and Neat     Behavior:  Sharing  Motor:  Uses walker and the help of others in walking  Speech/Language:   Normal Rate but limited in her talking, although talked more today  Affect:  Depressed, anxious  Mood:  depressed and anxiety  Thought process:  goal directed  Thought content:    Some rumination but did talk more than last session  Sensory/Perceptual disturbances:    WNL  Orientation:  oriented to person, place, time/date, situation, day of week, month of year, year, and stated date of Feb. 29, 2023  Attention:  Fair  Concentration:  Fair  Memory:  Some short term memory issues  Fund of knowledge:   Good  Insight:    Good and Fair  Judgment:   Good  Impulse Control:  Good   Risk Assessment: Danger to Self:  No Self-injurious Behavior: No Danger to Others: No Duty to Warn:no Physical Aggression / Violence:No  Access to Firearms a concern: No  Gang Involvement:No   Subjective:   Patient in today reporting anxiety still high but difficulty to talk initially. Uncertain feeling bout her involvement in the Spravato medication program.  Found it difficult to start talking initially today but did eventually with encouragement, began to talk a little more.  Seem to talk more about things a long time ago that she recalls within her family but at least it got her talking more.  And when asked as to whether some of those things are significant to her current depression, she did say "yes ", so we did focus on this as much as she was able.  We agreed that we will pick up on this more at her next appointment.  Also spoke more about her concerns regarding her medication and while I listen to her, I did gently  remind her that those questions would need to come from her physician or the nurse that helps her when she comes for her medication treatment.  Does not seem any worse today and with her talking more I took that as a positive sign.  Did smile several times during the session.  Did not seem as sleepy today.  Interventions: Cognitive Behavioral Therapy, Solution-Oriented/Positive Psychology, and Ego-Supportive  Treatment goals: Treatment goals remain on treatment plan as patient works with strategies to achieve her goals.  Progress is assessed each session and documented in the "plan/progress" section of treatment note. Treatment goals designed specifically for patient's needs and confirmed by her.   Being able to tolerate closeness and having a close family or close friend nearby. Tends to state "I can't, I'm too weak" and family has tried to encourage her to to allow closeness. 2.   Decrease her worrying.   Diagnosis:   ICD-10-CM   1. Severe bipolar I disorder, current or most recent episode depressed, with catatonia ([facility])  F31.4    F06.1      Plan:  Patient in today participating in session noted above as she did show a little more involvement in the session especially in talking a little more and smiled more often.  Hoping to build upon this session as she returns within approximately 2 weeks. Encouraged patient in her practice of  positive behaviors as discussed in session including: Staying on her medication as prescribed, getting outside some as she is able, stay in the present focusing on what she can change or control, remain in touch with supportive people, looking for more positives versus negatives each day, and recognize the strength she shows making efforts to work with goal-directed behaviors to move in a direction that supports her improved emotional health and emotional stability.  Goal review and progress/challenges noted with patient.  Next appointment within 3 weeks.  This  record has been created using [employer].  Chart creation errors have been sought, but may not always have been located and corrected.  Such creation errors do not reflect on the standard of medical care provided.   Shanon Ace, LCSW

## 2022-12-03 ENCOUNTER — Ambulatory Visit: Payer: Medicare Other | Admitting: Gastroenterology

## 2022-12-05 NOTE — Progress Notes (Signed)
NURSES NOTE:     Patient arrived for her 1st Spravato treatment. Pt is being treated for Treatment Resistant Depression, the starting dose is 56 mg (2 of the 28 mg) nasal sprays and she will also receive that dose today. Pt is under Dr. Casimiro Needle care, that is who will follow her treatment with Spravato as well.  She is accompanied again with her nurse, [name] and husband, Dr. Lyla Son. I did go over things again with them all as well as [name]. I answered any questions and concerns the patient or anyone had. Pt's Spravato is ordered through [facility] and the pt pays out of pocket with them.  All Spravato medication is stored at doctors office per REMS/FDA guidelines. The medication is required to be locked behind two doors per FDA/REMS Protocol. Medication is also disposed of properly per regulations.      [name], her nurse gets the patient's vital signs since she is sitting next to her, 2:30 PM 158/80, pulse 70, Pulse Ox 98%. Instructed patient to blow her nose then recline back as well as she could. Pt was assisted some by nurse due to pt's age and shakiness with holding the nasal spray, administered in each nostril as directed and waited 5 more minutes for the second dose. After both doses given her nurse and husband stayed with her. [name], checked pt's 40 minute vitals at 3:15 PM, 179/80, pulse 62. [name] had advised her nurse to administer Metoprolol 25 mg tablet after her 40 minute, prescribed by her cardiologist. They report nothing else needed at this time. Explained she would be monitored for a total time of 120 minutes. Discharge vitals were taken at 4:30 PM 172/82, P 64. [name] came over to visit with patient and Dr. Velora Heckler and discuss how treatment went for her second treatment. Recommend she go home and sleep or just relax. Verbalized understanding from all of them. Pt. will be receiving 2 treatments per week for 4 weeks as recommended. Nurse was with pt a total of 60 minutes for  clinical assessment. Pt is scheduled for her 2nd dose on February 12th, 2024. Pt instructed to call office with any problems or questions before she returns.      LOT'[id number]'$ [id number] EXP [id number]

## 2022-12-06 ENCOUNTER — Other Ambulatory Visit (HOSPITAL_BASED_OUTPATIENT_CLINIC_OR_DEPARTMENT_OTHER): Payer: Self-pay

## 2022-12-06 ENCOUNTER — Encounter: Payer: Self-pay | Admitting: Psychiatry

## 2022-12-06 ENCOUNTER — Ambulatory Visit (INDEPENDENT_AMBULATORY_CARE_PROVIDER_SITE_OTHER): Payer: Self-pay | Admitting: Psychiatry

## 2022-12-06 ENCOUNTER — Ambulatory Visit: Payer: Medicare Other

## 2022-12-06 VITALS — BP 141/64 | HR 60

## 2022-12-06 DIAGNOSIS — G251 Drug-induced tremor: Secondary | ICD-10-CM

## 2022-12-06 DIAGNOSIS — F061 Catatonic disorder due to known physiological condition: Secondary | ICD-10-CM

## 2022-12-06 DIAGNOSIS — F411 Generalized anxiety disorder: Secondary | ICD-10-CM

## 2022-12-06 DIAGNOSIS — R3589 Other polyuria: Secondary | ICD-10-CM

## 2022-12-06 DIAGNOSIS — Z79899 Other long term (current) drug therapy: Secondary | ICD-10-CM

## 2022-12-06 DIAGNOSIS — G3184 Mild cognitive impairment, so stated: Secondary | ICD-10-CM

## 2022-12-06 DIAGNOSIS — F314 Bipolar disorder, current episode depressed, severe, without psychotic features: Secondary | ICD-10-CM

## 2022-12-06 MED ORDER — LOREEV XR 2 MG PO CS24
1.0000 | EXTENDED_RELEASE_CAPSULE | Freq: Every morning | ORAL | 0 refills | Status: DC
  Filled 2022-12-06: qty 14, 14d supply, fill #0
  Filled 2022-12-06: qty 30, 30d supply, fill #0

## 2022-12-06 NOTE — Progress Notes (Addendum)
NURSES NOTE:     Patient arrived for her 6th total Spravato treatment. Pt is being treated for Treatment Resistant Depression, the starting dose is 56 mg (2 of the 28 mg) nasal sprays and today's dose is 84 mg, this is her 2nd dose of that strength. She tolerated the 84 mg well so we will continue this maintenance dose for her. Pt is under Dr. Casimiro Needle care, that is who will follow her treatment with Spravato as well.  She is accompanied today with her nurse, [name] and husband, Dr. Lyla Son. I did go over things again with them all as well as [name]. I answered any questions and concerns the patient or anyone had. Pt's Spravato is ordered through [facility] and the pt pays out of pocket with them.  All Spravato medication is stored at doctors office per REMS/FDA guidelines. The medication is required to be locked behind two doors per FDA/REMS Protocol. Medication is also disposed of properly per regulations.      Pt. taken to treatment room and begin vitals at 10:25 AM, 156/64, pulse 64, Pulse Ox 98%. Instructed patient to blow her nose then recline back as well as she could. Pt was assisted some by her nurse, [name] due to pt's age and shakiness with holding the nasal spray, administered in each nostril as directed and waited 5 more minutes for the second and third doses. After all doses given her aide and husband stayed with her. No complaints voiced, I checked pt's 40 minute vitals at 11:00 AM, 164/72, pulse 63, 96%. Explained she would be monitored for a total time of 120 minutes. Discharge vitals were taken at 1:00 PM 134/52, P 60, 98% O2 pulse ox. [name] came over to visit with patient and Dr. Velora Heckler and discuss how treatment went today. I explained she will not always get the same reaction at each visit, even if it's the same dose. Recommend she go home and sleep or just relax. Verbalized understanding from all of them. Pt. will be receiving 2 treatments per week for 4 weeks as recommended.  She will get another dose of 84 mg on Wednesday, February 28th. Nurse was with pt a total of 60 minutes for clinical assessment. Pt instructed to call office with any problems or questions before she returns.      [id number] EXP JAN 2027

## 2022-12-06 NOTE — Progress Notes (Signed)
NURSES NOTE:     Patient arrived for her  7th total Spravato treatment. Pt is being treated for Treatment Resistant Depression, the starting dose is 56 mg (2 of the 28 mg) nasal sprays and today's dose is 84 mg. She tolerated the 84 mg well so we will continue this maintenance dose for her. Pt is under Dr. Casimiro Needle care, that is who will follow her treatment with Spravato as well.  She is accompanied today with her nurse, [name] and husband, Dr. Lyla Son.  I answered any questions and concerns the patient or anyone had. Pt's Spravato is ordered through [facility] and the pt pays out of pocket with them.  All Spravato medication is stored at doctors office per REMS/FDA guidelines. The medication is required to be locked behind two doors per FDA/REMS Protocol. Medication is also disposed of properly per regulations.      Pt. taken to treatment room and begin vitals at 2:20 PM, 154/68, pulse 98, Pulse Ox 98%. Instructed patient to blow her nose then recline back as well as she could. Pt was assisted some by her nurse, [name] due to pt's age and shakiness with holding the nasal spray, administered in each nostril as directed and waited 5 more minutes for the second and third doses. After all doses given her aide and husband stayed with her. No complaints voiced, I checked pt's 40 minute vitals at 3:00 PM, 159/71, pulse 60, 96%. Explained she would be monitored for a total time of 120 minutes. Discharge vitals were taken at 4:00 PM 144/70, P 63, 98% O2 pulse ox. [name] came over to visit with patient and Dr. Velora Heckler and discuss how treatment went for her second 84 mg dose treatment. Pt remains irritable about receiving treatments. I explained she will not always get the same reaction at each visit, even if it's the same dose. Recommend she go home and sleep or just relax. Verbalized understanding from all of them. Pt. will be receiving 2 treatments per week for 4 weeks as recommended. She will get another  dose of 84 mg on Monday, March 4th. Nurse was with pt a total of 60 minutes for clinical assessment. Pt instructed to call office with any problems or questions before she returns.   LOT CO:[id number] EXP JAN [id number]

## 2022-12-06 NOTE — Progress Notes (Signed)
DOMNIQUE RAYSOR YD:[id number] [birth year] 82 y.o.  Subjective:   Patient ID:  Meghan Oliver is a 82 y.o. (DOB 06-Jan-1941) female. Patient was last seen August 21, 2018 Chief Complaint:  Chief Complaint  Patient presents with   Follow-up   Depression   Anxiety   Fatigue     Meghan Oliver presents to the office today for follow-up of Severe TR bipolar depression with psychotic features including catatonia.  11/30/2018 was the last visit and the following was noted: Had ECT yesterday at [facility].  Last was Jan 8 and was doing well then.  Next scheduled April 15.  Had a lithium level from there which is pending. Pretty good until the last 10 days to 2 weeks with less energy and motivation.  Bothered by old sick dog.  Worried about how that will affect her.  Has slept with the dog.  Enjoyed [place].  Going to gym and playing [place].  Took the class and didn't feel she caught on quickly. Wonders if it is bc of the ECT memory effects.  Bridge is harder and not playing.  Planning to return to [facility] mid April.   spent the winter in [place] per usual..  No significant depression since here. ECT frequency about 6 weeks.  Gets very anxious the day of the ECT.  ECT consistently for a year.  Best in mood in 7-8 years.  Also the pacemaker helped.  Friends notice the benefit.  Plan:  Option increase in the nortriptyline if needed.  Considered this.  They agree and would like to increase nortriptyline to 75 to try to reduce tendency to depression before the ECT.   03/19/19  Addendum: Here the contents from an email from the nurse for Ms. Chaudhuri  Hello [name],   I hope you and your family are healthy and well. I took Shantavious to [facility] for ECT and here are labs from 01/26/19. I am going to also forward them to [facility] for follow up with her LFT's. They are still higher than I would like.  Her lithium level was 0.51 on a daily dose of '300mg'$  qohs x 4 nights- alternating with '450mg'$  qohs x 3 nights. She is not  demonstrating any signs of elevated levels, minimal hand tremors, less anxiety and restlessness since her ECT treatment.  Her Nortryptyline level was 140 on '75mg'$  qhs.  I would like to continue her current dosages of both the above meds and recheck her lithium level at her next scheduled ECT of 03/23/19.  Please let me know of any changes you would like to make. Take care, [name] RN Lithium level was 0.4 on the '450mg'$  alt with 300 mg QOD. No changes were made in meds.  11/15/2019 phone call: Telephone call from patient's husband [name] on 11/14/2019  Patient is scheduled for ECT soon for maintenance ECT.  He thinks it is been 2-1/2 months since the last ECT but he is going to check the date for sure.  It is still be done being done at [facility].  He is wondering about skipping this treatment because the patient has continued to be free of depression and seems cognitively clearer as these ECT treatments have been spread out further from each other.  Patient remains on medications as prescribed.  If it is truly been over 2 months since the last ECT then it is reasonable to consider discontinuing ECT.  It is unlikely that ECT with the frequency of greater than  2 months is significantly helpful at preventing her relapse.  If however the ECT frequency is less than every 2 months it could still be helping to prevent recurrence.  He indicated he would consider this information and discuss it with her ECT team and make a decision.  They are heading to [place] in the next few weeks.  Needs to schedule an appointment with me for follow-up because it is been many months.  He agrees.   02/19/2020 appt, the following noted: Moving to [place] in a few months.  Ready to downsize.   Stopped ECT as discussed and has not been more deprressed.   Walks dog 4 times daily.   Lithium 0.7 on  01/30/20 on lithium 300 mg plus 150 mg on M, W, F Nortriptyline 70 on 75 mg HS. No SE except tremor.  Balance is not  great.  Very happy.   No depression since here.  No mood swings.  Sleep and appetite is OK.  No med changes:  04/03/2020 TC with the following noted: Patient's husband called stating that her tremor was a little worse and she was having some stutter. No evidence of stroke otherwise. First thing would be to check serum lithium level and BMP.  Order written.  Have asked her husband to let us know about the lab to which it should be sent.  04/22/20 TC witht he following noted: Lithium level is not dangerously high but it is has crept up to 1.0 which is higher than desired for her.  Our goal for her is 0.5-0.7.  At the current level it is likely causing side effects so we should reduce the dosage from lithium 300 mg plus 150 mg on M, W, F  TO 300 mg daily.  Meaning drop off the 150 mg capsule.  It will take about 2 weeks for the level to gradually come down to the desired level.  05/08/2020 appt with the following noted: Seen with H and Meghan Oliver nurse. Moving to [facility] and feels OK about it.  I think I'll be fine.   Pretty good with balance.  Doing yoga and it helps. Meghan Oliver has cancer. Old dog 16 & 1/2 yo still living. Meghan Oliver's concerns when went to [place] for D's BD she had difficulty time with tremor lethargy, more confusion.  Better when go back home. [name] notes using more lorazepam. At times gait is unsteady. Not significantly depressed.  Can be anxious at times.  Eating okay.  Sleeping okay.  Is easily confused under stress. Plan: reduce lithium to 300 mg nightly.  07/14/20 appt with the following noted: Seen with her husband as well as her family nurse. Feeling fine and pleased with that.   Some anxiety over the move to smaller place. Meghan Oliver's twin also having heart problems Gene. [name]'s kids came and visited.    Plan: Continue nortriptyline 75 mg, paroxetine 20 mg, lithium 300 mg, Namenda 10 mg twice daily, lorazepam 0.5 mg 1/2-1 3 times daily as needed, mirtazapine 7.5 mg  nightly  02/27/2021 appointment with the following noted:  Seen with H and nurse Corrales email from nurse [name] on 02/24/2021 indicated that he had moved into [facility] in May.  Meghan Oliver has gradually become more depressed and more anxious using lorazepam as prescribed 3 times daily.  More memory issues.  She has remained engaged and initiating appropriate conversation.  She is very worried about her 73 year old dog who is in poor health and fears she will  become more depressed when the dog dies.  She and her husband do not wish to prefer to pursue further ECT unless absolutely necessary. Likes [facility] so far.   No depression by her report.  Sold big house in Feb.  It worked out well.   Worries about her dog 41 yo will die soon. H agrees she's done well with depression.   Enjoys things and has interests. Plan: Continue Lithium 300 mg daily  Continue nortriptyline 75 mg daily Continue paroxetine 20 mg daily Continue lorazepam 0.5 mg 3 times daily for both anxiety and catatonia Continue vitamin B6 and primidone for tremor Cerefolin NAC 2 daily.  05/13/2021 appointment with the following noted: Happy with Meghan Oliver.  H and she agree is doing well with depression.Meghan Oliver socialization. She complains of memory.   Family visited and getting along with Baker Janus better.  H says Baker Janus is acting better.  Goes to dinner and activities together now.  Better relationship with Gene.. Tremor has been OK.   Ativan usually 0.25 mg TID and tolerated. Anxiety managed with this generally. Molly dog health more stable. To [place] before T'giving until April.   Plan: Continue Lithium 300 mg daily  Continue nortriptyline 75 mg daily Continue paroxetine 20 mg daily Continue lorazepam 0.5 mg 3 times daily for both anxiety and catatonia Continue vitamin B6 and primidone for tremor   08/14/2021 appointment with the following noted: Seen with her husband Meghan Oliver and nurse Meghan Oliver. Everyone reports that  her mood has been stable.  Her anxiety is manageable with as needed lorazepam.  She is still happy with the transition to [facility].  They are preparing to go to [place] for the winter per usual but may sell their house father there. Tolerating meds without any unusual side effects. Is dealing with grief over the loss of her dog Molly.  03/10/2022 appt noted:  seen with H and nurse Vicie Mutters [facility] house. Meghan Oliver not good phsyically hard to play golf.  Been really hard. He'll be 85 in July.  Some stress scheduling with D's.   53 th wedding necessary this month.   Mood up and down.  Sister in law across the street can be difficult and demeaning.  She feels unsettled.  Some worry. Some back pain limiting activity.   Sleep is ok except with pain awakening.   No SE, except tremor some worse about 3-5 and helped by lorazepam 0.25 mg then No greater than 1 mg lorazepam daily Patient denies any recent difficulty with anxiety except as noted.  Patient denies difficulty with sleep initiation or maintenance. Denies appetite disturbance.    Patient has some difficulty with concentration.  Patient denies any suicidal ideation.  Usually takes Ativan at night and sleeps well 8 hours.   Plan no med changes  10/23/213 appt noted: Still doing well. Pneumonia and hosp for 6 days 3 weeks ago.  Viral.  Improving at this time. Increased tremor gradually in 3-4 mos helped by lorazepam avg 1.00-1.25 mg daily. No change in primidone. Total R hip August 2023 and done well.  Finishing home PT.   Appetite poor for 7-8 mos.  2 protein drinks daily but may not eat more than 1 good meal a day. [facility]. Substantial memory problems in hospital and in unusual enviornments. Nurse notices decline.  B with LBD passed in August. No pain meds or muscle relaxants beyond pain. No depression.  H note sometimes she has less motivation than others. [name] notes some normal waxing and  waning of mood. Good social interaction.   Not  spending winter in [facility] this year.  D still lives there. Plan: Increase mirtazapine 15 mg HS for appetite.  Also used for sleep and depression.  09/23/22 appt noted:  with nurse and Meghan Oliver Meghan Oliver feeling bad with more pain and fatigue.  Meghan Oliver has CHF.   Made Josiphine sad.  Lost a cgood friends.  Rough time.  Mostly worry about Meghan Oliver.  She feels afraid to be alone if something happens.   Not sig depressed.    Just worry . H can't be as active.  Taking a shower is hard.   She's back exercising again.   Went to [employer] for Thanksgiving in [facility] and plan to go back for Christmas. It's hard to travel. Told nurse she was depressed. Appetite is better and once asleep is OK. Using more lorazepam for anxiety 3-4 daily. Plan: Continue Lithium 300 mg daily  Continue nortriptyline 75 mg daily Continue paroxetine 20 mg daily Continue lorazepam 0.5 mg 3 times daily for both anxiety and catatonia Continue vitamin B6 and primidone for tremor Increased and it helped mirtazapine 15 mg HS for appetite.  Also used for sleep and depression.  10/19/22  RTC  [name] reports not doing well.  Went to [facility].  Recent pneumonia.  They are thinking this is aspiration pneurmonia. Lethargic and anxious with tremor. Ruminating.  Hot to cold .  Low tolerance.  Going downhill quickly.  She's verbalizing depression and desire to isolate.  Won't go out.  Started within the last 2 weeks. Usually doesn't complain when she's sick.   [name]'s biggest concern is that Inocente Salles is having trouble handling this bc she's up at night rambling and not really safe to be up at night.    Will see her 10/22/22 Get lithium and nortriptyline levels.   Lynder Parents, MD, [facility]     10/22/22 emergency appt with nurse Meghan Oliver and H Meghan Oliver: Pneumonia Oct and then again 2nd week January.  It appears to have largely resoved.  It's really bad.  Nausea. Anxious bc Meghan Oliver ill.  Doesn't want him to leave.  Meghan Oliver sees anxiety triggering anxiety with little to no appetite.  Hypersensitive to  stimuli like light and noises.  Doesn't want TV on.  Meghan Oliver says totally reversed in last 6 weeks.    Meghan Oliver reports she's overemotional.  Friends died. Losing wt 91.4# today. More trouble with sleep. Lorazepam increased to 2 mg daily.  Not sleepy with it.  Doesn't seem to be helpful. Last week neglect hygiene but better this week a little. [name] says STM is poor at times and at times disoriented if in [place].  Will walk in bathroom and wonder where sh is. Barium swallow pending for evaluation of refulex.    10/26/22 RTC:  RTC   No improvement since Friday.  Gradually less engaged.  Can be overstimulated with visitors.   Disc nortriptyline level 115 is a little higher than it was and higher levels can cause SE in elderly.   Reduce nortriptyline to 50 mg HS. Taking lorazepam    Consider low dose of Vraylar or pramipexole.  Or Spravato.     Lynder Parents, MD, [facility]     11/15/22 appt noted: Dialogue with Dr. Aundra Dubin cardiologist agreed pt should be stable enough from CV perspective to receive Spravato which could elevate BP.  If that occurs use prn metoprolol 25 mg. Disc in detail with Dr. Velora Heckler and he agrees Ebony Cargo is appropriate though not  FDA approved for bipolar depression.  Disc 2 metanalysis in support of this and this alernative is safer and likely better tolerated than the option of ECT and faster than options of trials of Trintellix, Auvelity, Latuda etc. Tolerating meds.  She is depressed and very anxious and intermittently confused.  Taking clothes on and off bc rapid changes in comfort.  Worried over [employer].  Sleep disrupted appetite some better with mirtazapine. Received Spravato 56 mg first time today and was dissociated.  Resolved over 2 hours.  No HA, NV.  Was distressed moderately and very ambivalent about Tx.  H and nurse supportive of tx plan and for the pt.    11/24/22 appt noted: Current meds: nortriptyline 50 mg HS, paroxetine 20 mg daily, lithium 300 mg nightly, mirtazapine  30 mg nightly, memantine 10 mg twice daily.  Lorazepam 0.5 mg every 4 to 6 hours as needed catatonia or severe anxiety. Received first dose of Spravato 84 mg today.  She was more noticeably dissociated than with the 56 mg dose.  It did resolve over the 2-hour course of observation although she remained somewhat unsteady and needed to leave via wheelchair.  She did not have headache nausea or vomiting.  She remains ambivalent about the treatment. Husband notes she has continued to be anxious and depressed and was extremely anxious yesterday and somewhat confused.  At times would say things that did not make sense.  He remained supportive of the treatment while she remains ambivalent about the treatment.  Her appetite is good and she is eating well.  Sleep is variable.  H reported at times will be almost normal after receiving an extra dose of lorazepam 0.5 mg prn but this only lasts an hour or so. Plan: Continue Lithium 300 mg daily  Continue nortriptyline 50 mg daily Continue paroxetine 20 mg daily Continue vitamin B6 and primidone for tremor Imirtazapine 30 mg HS for appetite,  used for sleep and depression.  Appetite better Switch lorazepam to [facility] ER 2 mg AM and use lorazepam 0.5 mg prn.  For catatonia in hopes of more consistent relief.  Decision based on observation of periods of almost normalcy after some BZ doses.  We discussed potential switch of paroxetine and nortriptyline to Auvelity or Trintellix or possibly [place].  However these switches will be difficult and it is hoped that the Spravato may provide enough improvement to allow a switch in medicine more tolerable.  11/26/22 appt noted: Current meds: nortriptyline 50 mg HS, paroxetine 20 mg daily, lithium 300 mg nightly, mirtazapine 30 mg nightly, memantine 10 mg twice daily.  Lorazepam 0.5 mg every 4 to 6 hours as needed catatonia or severe anxiety.  Started Loreev 2 mg daily over weekend Received Spravato 84 mg today.   Spravato with  expected dissociation.  She didn't experience relief from negative emotion.  Dissociation resolved over observation period.  No N, V, HA.   Still high anxiety and dread of most everything.  Depressed with negative thought predominates.  H notes periods of confusion and then maybe an hour of clarity sometimes after the dosing of lorazepam.  Then back to severe sx.  Eating is fair. No SI.  Fearful generally.  Needs help with ADLs.  Cannot take meds on her own. Tolerating meds without SE No noticeable effect from Loreev in terms of benefit or SE  12/01/22 appt noted: Received a phone call from nurse Meghan Oliver this morning to call as soon as possible this patient very agitated and  confused.  Thrashing around in bed.  At times talking about dying out of the fear that she was going to die.  Highly anxious.  Not very cooperative with drinking water or taking medicines this morning. Discussed the situation with her husband as well and we have to options: 1's if we can get her to the office to receive Spravato is scheduled today that may provide some immediate relief and forestall and may be prevent hospitalization.  The second option is to proceed directly to hospitalization.  If she is unable to take nutrition and hydration hospitalization will be necessary. Discussed that appeared to be catatonic hyperactivity and agitation which has not responded to the extended release lorazepam 2 mg a day.  Therefore she was given 1 mg of Ativan this morning and did calm down enough to make it to the Spravato administration this afternoon.  She was able to cooperate with Spravato administration and tolerated it well with typical levels of dissociation which gradually resolved over the 2 hours.  She was able to eat and drink fluids prior to coming to the appointment. Current meds: nortriptyline 50 mg HS, paroxetine 20 mg daily, lithium 300 mg nightly,  memantine 10 mg twice daily.  Lorazepam 0.5 mg every 4 to 6 hours as needed  catatonia or severe anxiety.  Started Loreev 2 mg daily over weekend olanazpine 2.5 mg HS Received Spravato 84 mg today.   Husband notes that after Spravato administration on Monday she was much improved for a period of several hours and was able to have conversations with her family members and make decisions about distributing some of her jewelry and other family decisions.  However over Monday evening into Tuesday she became more agitated and confused and anxious again.  There have been no obvious effects of taking the olanzapine but it does not appear that long-acting lorazepam 2 mg as provided any significant relief. Plan: Continue Lithium 300 mg daily  Continue nortriptyline 50 mg daily Continue paroxetine 20 mg daily Continue vitamin B6 and primidone for tremor DC mirtazapine For catatonia: lorazepam0.5 mg q 4 hour mg daily as tolerated for catatonia with dose spread through the day..  Nurse manages. Restart Loreeve 2 mg daily and use prn lorazepam for catatonia Increase olanzapine 5 mg HS for TRD, anxiety, catatonia, delusions of persecution  12/06/22 appt noted; Psych meds: as above except increase Loreev to 2 mg BID for severe catatonia. nortriptyline 50 mg HS, fluoxetine 20 mg daily, lithium 300 mg nightly,  memantine 10 mg twice daily.  Lorazepam 0.5 mg every 4 to 6 hours as needed catatonia or severe anxiety.  Started Loreev 2 mg increased BID for severe catatonia,  increased olanazpine 5 mg HS for 3 days. Received 5th dose of Spravato 84 mg today.    She was able to cooperate with Spravato administration and tolerated it well with typical levels of dissociation which gradually resolved over the 2 hours.  She was able to eat and drink fluids prior to coming to the appointment.   She still feels confused and depressed and anxious. Per family had periods over the weekend where she was confused thinking her H was dead even though he was right in front of her.  Also thinking she is going to  die and not get better.  Is eating and drinking fluids well.  Has needed extra lorazepam to sleep.  Limited periods of lucidity per H. Tolerating high dose lorazepam but limited benefit for catatonia.   No current  alcohol problems  Saw Dr. Carles Collet last week and no change in neuro meds.  Past Psychiatric Medication Trials:  Prozac 40 with lithium and Zyprexa 2.5 to 5 mg,  nortriptyline, paroxetine, Effexor 2257 Wellbutrin 300 mirtazapine,  others Pramipexole 0.625 daily  Rexulti EPS, Abilify 15, Vraylar 1.5 1 week,  Latuda 10 mg SE, Olanzapine 10 briefly   Lamotrigine level 7.5  Namenda,  lorazepam,   this loss list is not exhaustive  Review of Systems:  Review of Systems  Constitutional:  Positive for fatigue. Negative for appetite change.  Cardiovascular:  Negative for palpitations.  Gastrointestinal:  Positive for constipation and nausea.       Linzess and Mozantic managed   Musculoskeletal:  Positive for gait problem.  Neurological:  Positive for dizziness and weakness. Negative for tremors.       Some balance issues  Psychiatric/Behavioral:  Positive for agitation, confusion, decreased concentration and dysphoric mood. Negative for behavioral problems, hallucinations, self-injury, sleep disturbance and suicidal ideas. The patient is nervous/anxious. The patient is not hyperactive.     Medications: I have reviewed the patient's current medications.  Current Outpatient Medications  Medication Sig Dispense Refill   amoxicillin (AMOXIL) 500 MG capsule Take 3 capsules (1,500 mg total) by mouth 1 hour prior to dental work. 9 capsule 1   aspirin 81 MG chewable tablet Chew 1 tablet (81 mg total) by mouth daily.     Calcium Carbonate-Vitamin D (CALCIUM 600/VITAMIN D PO) Take 1 tablet by mouth 2 (two) times daily.     cetirizine (ZYRTEC) 10 MG tablet Take 10 mg by mouth daily as needed for allergies.     Cholecalciferol (VITAMIN D3) 50 MCG (2000 UT) TABS Take 2,000 Units by mouth  in the morning.     COVID-19 mRNA vaccine 2023-2024 (COMIRNATY) syringe Inject into the muscle. 0.3 mL 0   dexlansoprazole (DEXILANT) 60 MG capsule Take 1 capsule by mouth daily 90 capsule 3   Esketamine HCl, 84 MG Dose, (SPRAVATO, 84 MG DOSE,) 28 MG/DEVICE SOPK Place 84 mg into the nose every 3 (three) days. 3 each 5   fesoterodine (TOVIAZ) 8 MG TB24 tablet Take 1 tablet (8 mg total) by mouth daily. 90 tablet 3   glycerin adult 2 g suppository Use 1 suppository as needed if you have not had a bowel movement in  4 to 5 days 25 suppository 0   influenza vaccine adjuvanted (FLUAD QUADRIVALENT) 0.5 ML injection Inject into the muscle. 0.5 mL 0   Krill Oil 500 MG CAPS Take 500 mg by mouth daily.     lactulose (CHRONULAC) 10 GM/15ML solution Take 30 mLs (20 g total) by mouth 3 (three) times daily between meals as needed for mild constipation. (Patient taking differently: Take 20 g by mouth daily as needed for mild constipation.) 473 mL 3   Lavender Oil 80 MG CAPS Take 160 mg by mouth at bedtime.     levothyroxine (SYNTHROID) 75 MCG tablet Take 1 tablet (75 mcg total) by mouth daily. 90 tablet 2   levothyroxine (SYNTHROID) 75 MCG tablet Take 1 tablet (75 mcg total) by mouth daily. 90 tablet 2   linaclotide (LINZESS) 72 MCG capsule Take 2 capsules (144 mcg total) by mouth daily before breakfast. 180 capsule 1   lithium carbonate 300 MG capsule Take 1 capsule (300 mg total) by mouth at bedtime. 90 capsule 0   LORazepam (ATIVAN) 0.5 MG tablet TAKE 2 TABLETS (1 MG)  BY MOUTH EVERY NIGHT AT BEDTIME AND 1  TABLET EVERY 4 HOURS AS NEEDED FOR ANXIETY (Patient taking differently: Take 0.5 mg by mouth every 6 (six) hours as needed. TAKE 2 TABLETS (1 MG)  BY MOUTH EVERY NIGHT AT BEDTIME AND 1 TABLET EVERY 4 HOURS AS NEEDED FOR ANXIETY) 270 tablet 0   LORazepam ER (LOREEV XR) 2 MG CS24 Take 1 capsule by mouth every morning. (Patient taking differently: Take 1 capsule by mouth 2 (two) times daily.) 30 capsule 0   magic  mouthwash w/lidocaine SOLN Take 5 mLs by mouth 3 (three) times daily as needed for mouth pain. 120 mL 0   memantine (NAMENDA) 10 MG tablet Take 1 tablet (10 mg total) by mouth 2 (two) times daily. 180 tablet 1   metoprolol tartrate (LOPRESSOR) 25 MG tablet Take 1 tablet (25 mg total) by mouth as needed. 45 tablet 3   Multiple Vitamins-Minerals (MULTIVITAMIN WITH MINERALS) tablet Take 1 tablet by mouth daily.     Multiple Vitamins-Minerals (PRESERVISION AREDS 2 PO) Take 1 capsule by mouth in the morning and at bedtime.     naloxegol oxalate (MOVANTIK) 12.5 MG TABS tablet Take 2 tablets (25 mg total) by mouth daily. (Patient taking differently: Take 25 mg by mouth daily. PRN) 60 tablet 3   nortriptyline (PAMELOR) 25 MG capsule Take 2 capsules (50 mg total) by mouth at bedtime. 180 capsule 1   OLANZapine (ZYPREXA) 2.5 MG tablet Take 1 tablet (2.5 mg total) by mouth at bedtime. (Patient taking differently: Take 5 mg by mouth at bedtime.) 30 tablet 0   ondansetron (ZOFRAN-ODT) 4 MG disintegrating tablet Place 1 tablet every 6 hours by translingual route as needed. 20 tablet 0   PARoxetine (PAXIL) 20 MG tablet Take 1 tablet (20 mg total) by mouth at bedtime. 90 tablet 0   pneumococcal 20-valent conjugate vaccine (PREVNAR 20) 0.5 ML injection Inject into the muscle. 0.5 mL 0   polyethylene glycol (MIRALAX / GLYCOLAX) 17 g packet Take 17 g by mouth daily as needed for mild constipation.     primidone (MYSOLINE) 50 MG tablet TAKE 2 TABLETS BY MOUTH EVERY MORNING AND 1 TABLET EVERY EVENING 270 tablet 0   Pyridoxine HCl (VITAMIN B-6) 500 MG tablet Take 500 mg by mouth 2 (two) times daily.     RSV vaccine recomb adjuvanted (AREXVY) 120 MCG/0.5ML injection Inject into the muscle. 0.5 mL 0   sennosides-docusate sodium (SENOKOT-S) 8.6-50 MG tablet Take 2 tablets by mouth as needed.     valACYclovir (VALTREX) 1000 MG tablet Take 2 tablets by mouth at onset, then 2 tablets 12 hours later. (Patient taking differently:  Take 2,000 mg by mouth every 12 (twelve) hours. PRn) 30 tablet 1   vitamin B-12 (CYANOCOBALAMIN) 1000 MCG tablet Take 1,000 mcg by mouth at bedtime.     No current facility-administered medications for this visit.    Medication Side Effects: Other: tremor  stable.  No worse.  Allergies:  Allergies  Allergen Reactions   Propranolol Other (See Comments)    Low blood pressure. Bradycardia    Brexpiprazole Other (See Comments)    Aphasia and catatonia    Past Medical History:  Diagnosis Date   Anxiety    Arthritis    "hips, spine" (03/17/2018)   Bipolar II disorder (HCC)    CHF (congestive heart failure) (HCC)    Chronic bronchitis (HCC)    Chronic lower back pain    Chronic right hip pain    CKD (chronic kidney disease), stage II    Coronary  artery disease    stent x1   Esophagitis, erosive    GAD (generalized anxiety disorder)    GERD (gastroesophageal reflux disease)    Headache    "maybe monthly" (03/17/2018))   Heart murmur, systolic    History of adenomatous polyp of colon    08-04-2016  tubular adenoma   History of blood transfusion 12/2017   "related to vascular hematoma"   History of electroconvulsive therapy    at [facility]--  started 04-15-2015 to 11-17-2016  total greater than 40 times   History of hiatal hernia    Hyperlipidemia    Hypertension    Hypothyroidism    Internal carotid artery stenosis, bilateral    per last duplex 05-01-2014  bilateral ICA 40-59%   Major depression, chronic    ECT treatments extensive and multiple started 07/ 2016   Memory loss    "both short and long-term; needs frequent reminders to follow instrucitons" (05/16/2017)   Migraines    "none in years" (03/17/2018)   OSA (obstructive sleep apnea)    per study 06/ 2012 moderate OSA  ; "refuses to wear masks" (03/17/2018)   Osteoporosis    Pneumonia 07/29/2022   Poor historian    due to short term memory loss   Presence of permanent cardiac pacemaker 03/17/2018   Pulmonary nodule     monitored by pcp   S/P placement of cardiac pacemaker 03/17/18 [facility]  03/18/2018   Short-term memory loss    Sick sinus syndrome (HCC)     Family History  Problem Relation Age of Onset   Heart attack Father 24       deceased   Hypertension Father    Heart disease Father    Dementia Brother    Parkinson's disease Brother    Heart disease Brother    Breast cancer Paternal Aunt        Age 69's   Breast cancer Paternal Grandmother        Age unknown   Colon cancer Neg Hx     Social History   Socioeconomic History   Marital status: Married    Spouse name: Dr. Lyla Son   Number of children: 2   Years of education: Not on file   Highest education level: Not on file  Occupational History   Occupation: housewife    Employer: UNEMPLOYED  Tobacco Use   Smoking status: Former    Packs/day: 2.00    Years: 15.00    Total pack years: 30.00    Types: Cigarettes    Quit date: 01/13/1971    Years since quitting: 51.9    Passive exposure: Never   Smokeless tobacco: Never  Vaping Use   Vaping Use: Never used  Substance and Sexual Activity   Alcohol use: Yes    Comment: occassional 1 x a week   Drug use: Never   Sexual activity: Not Currently    Comment: intercourse age 51, sexual partners less than 5  Other Topics Concern   Not on file  Social History Narrative   Right handed   Social Determinants of Health   Financial Resource Strain: Not on file  Food Insecurity: No Food Insecurity (07/06/2022)   Hunger Vital Sign    Worried About Running Out of Food in the Last Year: Never true    Ran Out of Food in the Last Year: Never true  Transportation Needs: No Transportation Needs (07/06/2022)   PRAPARE - Hydrologist (Medical): No  Lack of Transportation (Non-Medical): No  Physical Activity: Not on file  Stress: Not on file  Social Connections: Not on file  Intimate Partner Violence: Not At Risk (07/06/2022)   Humiliation, Afraid, Rape, and  Kick questionnaire    Fear of Current or Ex-Partner: No    Emotionally Abused: No    Physically Abused: No    Sexually Abused: No    Past Medical History, Surgical history, Social history, and Family history were reviewed and updated as appropriate.   Please see review of systems for further details on the patient's review from today.   Objective:   Physical Exam:  LMP  (LMP Unknown)   Physical Exam Constitutional:      General: She is not in acute distress.    Appearance: She is well-developed.  Musculoskeletal:        General: No deformity.  Neurological:     Mental Status: She is alert and oriented to person, place, and time.     Motor: No tremor.     Coordination: Coordination abnormal.     Comments: Cane use  Psychiatric:        Attention and Perception: She is attentive.        Mood and Affect: Mood is anxious and depressed. Affect is not labile, blunt, tearful or inappropriate.        Speech: Speech is not slurred.        Behavior: Behavior normal. Behavior is not slowed or withdrawn.        Thought Content: Thought content normal. Thought content is not delusional. Thought content does not include homicidal or suicidal ideation. Thought content does not include suicidal plan.        Cognition and Memory: She exhibits impaired recent memory.        Judgment: Judgment normal.     Comments: Insight fair to good. No auditory or visual hallucinations.  Depression and anxiety are worse lately.  Ruminative  Gait ok. Memory appears worse. Kept eyes closed insession and not as coherent at 2 hour after Spravato as last visits.     Lab Review:     Component Value Date/Time   NA 137 11/11/2022 1106   NA 136 04/20/2017 1151   K 4.6 11/11/2022 1106   CL 106 11/11/2022 1106   CO2 23 11/11/2022 1106   GLUCOSE 97 11/11/2022 1106   BUN 24 11/11/2022 1106   BUN 24 04/20/2017 1151   CREATININE 0.73 11/11/2022 1106   CALCIUM 9.4 11/11/2022 1106   PROT 6.9 11/11/2022 1106    PROT 6.2 04/20/2017 1151   ALBUMIN 3.9 09/02/2022 1221   ALBUMIN 3.9 04/20/2017 1151   AST 44 (H) 11/11/2022 1106   ALT 86 (H) 11/11/2022 1106   ALKPHOS 64 09/02/2022 1221   BILITOT 0.2 11/11/2022 1106   BILITOT <0.2 04/20/2017 1151   GFRNONAA >60 07/08/2022 0429   GFRNONAA 50 (L) 07/10/2020 1021   GFRAA 58 (L) 07/10/2020 1021       Component Value Date/Time   WBC 6.6 11/11/2022 1106   RBC 3.88 11/11/2022 1106   HGB 12.1 11/11/2022 1106   HGB 10.2 (L) 04/20/2017 1151   HCT 36.3 11/11/2022 1106   HCT 29.6 (L) 04/20/2017 1151   PLT 215 11/11/2022 1106   PLT 385 (H) 04/20/2017 1151   MCV 93.6 11/11/2022 1106   MCV 89 04/20/2017 1151   MCH 31.2 11/11/2022 1106   MCHC 33.3 11/11/2022 1106   RDW 13.7 11/11/2022 1106   RDW  13.9 04/20/2017 1151   LYMPHSABS 1,439 11/11/2022 1106   LYMPHSABS 1.5 04/20/2017 1151   MONOABS 0.7 07/07/2022 0458   EOSABS 112 11/11/2022 1106   EOSABS 0.3 04/20/2017 1151   BASOSABS 53 11/11/2022 1106   BASOSABS 0.0 04/20/2017 1151    Lithium Lvl  Date Value Ref Range Status  10/21/2022 0.5 (L) 0.6 - 1.2 mmol/L Final  10/21/2022 lithium level 0.5 on 300 mg nightly is stable.,  Nortriptyline 115 on 75 mg HS.  08/02/22 nortriptyline 85, lihtium 0.5  Nortriptyline level 51 on 50 mg nightly with paroxetine 20 mg daily  07/10/2020 lithium level 0.6 at [facility] office and nortriptyline level 130 on the current dosages of lithium 300 mg nightly and nortriptyline 75 mg nightly  02/20/2021 labs lithium 0.6 on 300 mg nightly.  Nortriptyline level 49 which is lower than expected on 75 mg nightly along with paroxetine 20 mg daily.    No results found for: "PHENYTOIN", "PHENOBARB", "VALPROATE", "CBMZ"   .res Assessment: Plan:    Severe bipolar I disorder, current or most recent episode depressed, with catatonia ([place]) - Plan: LORazepam ER (LOREEV XR) 2 MG CS24  Generalized anxiety disorder - Plan: LORazepam ER (LOREEV XR) 2 MG CS24  Mild cognitive  impairment  Polyuria  Lithium-induced tremor  Lithium use    History of lithium toxicity repeatedly.  Hx TX resistant bipolar depression with catatonia.  Recent decompensation and rapidly getting worse with some cognive impairment and mild psychotic sx and altered mental status.Depression and anxiety much worse in the last several weeks.  Her case has been highly complex and treatment resistant at times. Disc pros and cons of return to ECT.  She is strongly opposed at this time.   Dialogue with Dr. Aundra Dubin cardiologist agreed pt should be stable enough from CV perspective to receive Spravato which could elevate BP.  If that occurs use prn metoprolol 25 mg. Disc in detail with Dr. Velora Heckler and he agrees Ebony Cargo is appropriate though not FDA approved for bipolar depression.  Disc 2 metanalysis in support of this and this alernative is safer and likely better tolerated than the option of ECT and faster than options of trials of Trintellix, Auvelity, Latuda etc. BP has been ok.    Not responding so far with Spravato consistently though has had previous sessions after which she had some coherence.  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  During the severe depression she has catatonia.  She had required longterm ECT.  A previous episode of depression approximately 10 years ago stayed in remission on Paxil and nortriptyline and lithium for a period of years until the lithium level was decreased due to tremor.  Now we are maintaining an adequate lithium level.  She has had several episodes of lithium toxicity with hospitalization.  This resolved once she stopped chlorthalidone.  From records it appears her last ECT was in September 2020.    Patient was administered Spravato 84 mg intranasally today.  The patient experienced the typical dissociation which gradually resolved over the 2-hour period of observation but she remains weak and inattentive.  There  were no complications.  Specifically the patient did not have nausea or vomiting or headache.  Blood pressures normal at 40-minute and 2-hour follow-up intervals.  By the time the 2-hour observation period was met the patient was alert and oriented and able to exit without assistance.  Patient feels the Spravato administration is helpful for the treatment resistant depression  and would like to continue the treatment.  See nursing note for further details.  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  She is continuing under the care of her neurologist Dr. Carles Collet.  The nortriptyline was increased to 75 to try to reduce tendency to depression in February 2020.  08/02/22 nortriptyline 85, lihtium 0.5 Received 10/26/22= 115.  Reduced nortriptyline to 50 mg HS to protect from SE  Will reduce again in plan to change meds.  Lithium level goals are between 0.5 and 0.7.  She has a history of lithium toxicity when she was taking chlorthalidone with the lithium.   Discussed signs and symptoms of lithium toxicity.  Discussed the risk of increasing depression if the lithium level gets too low.  She cannot tolerate higher doses of lithium.  Awaiting EUMO to evaluate polyuria.  Get this and UA ASAP to RO UTI.  She had one in Jan 2024  There remains some concern about drug interactions between paroxetine and nortriptyline and given her age we do not want the nortriptyline levels higher than necessary .  Disc risk polypharmacy  Continue Lithium 300 mg daily  reduce nortriptyline 25 mg daily in anticipation of change Reduce paroxetine 10 mg daily in anticipation of change Continue vitamin B6 and primidone for tremor For catatonia: lorazepam0.5 mg q 4 hour mg daily as tolerated for catatonia with dose spread through the day..  Nurse  manages. DT severity of catatonic hyperactivy Loreeve 2 mg BID and use prn lorazepam for catatonia Continue olanzapine 5 mg HS for TRD, anxiety, catatonia, delusions of persecution  Conisder switch olanzapine to risperidone but it is helping appetite We discussed potential switch of paroxetine and nortriptyline to Auvelity or Trintellix or possibly Latuda.  Option stimulant.  Pramipexole 1 mg daily. Extensive review prior meds with nurse and 3 volumes of charts.  However these switches will be difficult and it is hoped that the Spravato may provide enough improvement to allow a switch in medicine more tolerable.  Continue Spravato 84 mg twice weekly.  Disc BZ can diminish benefit but catatonia requires BZ  Doing the above to try to keep her out of hospital but disc option return to ECT.  She does not want this. ECT may be necessary  Lynder Parents MD, DFAPA Please see After Visit Summary for patient specific instructions.  Future Appointments  Date Time Provider [place]  12/20/2022  1:00 PM Shanon Ace, LCSW [facility] None  01/03/2023  3:20 PM [facility] DEVICE REMOTES [facility]  01/10/2023  1:00 PM Shanon Ace, LCSW [facility] None  01/18/2023  9:30 AM Mauri Pole, MD [facility]  04/04/2023  3:20 PM [facility] DEVICE REMOTES [facility]  07/04/2023  3:20 PM [facility] DEVICE REMOTES [facility]  10/03/2023  7:00 AM [facility] DEVICE REMOTES [facility]      No orders of the defined types were placed in this encounter.      -------------------------------

## 2022-12-06 NOTE — Progress Notes (Signed)
NURSES NOTE:     Patient arrived for her 8th total Spravato treatment. Pt is being treated for Treatment Resistant Depression, the starting dose was 56 mg (2 of the 28 mg) nasal sprays and today's dose is 84 mg. She tolerated the 84 mg well so we will continue this maintenance dose for her. Pt is under Dr. Casimiro Needle care, that is who will follow her treatment with Spravato as well.  She is accompanied today with her nurse, [name] and husband, Dr. Lyla Son.  I answered any questions and concerns the patient or anyone had. Pt's Spravato is ordered through [facility] and the pt pays out of pocket with them.  All Spravato medication is stored at doctors office per REMS/FDA guidelines. The medication is required to be locked behind two doors per FDA/REMS Protocol. Medication is also disposed of properly per regulations.      Pt. taken to treatment room and begin vitals at 11:20 AM, 159/71, pulse 60, Pulse Ox 98%. Instructed patient to blow her nose then recline back as well as she could. Pt was assisted some by her nurse, [name] due to pt's age and shakiness with holding the nasal spray, administered in each nostril as directed and waited 5 more minutes for the second and third doses. After all doses given her nurse and husband stayed with her. No complaints voiced, [name] checked pt's 40 minute vitals at 12:00 PM, 150/70, pulse 75, 98%. Explained she would be monitored for a total time of 120 minutes. Discharge vitals were taken at 1:20 PM 141/64, P 60, 98% O2 pulse ox. [name] came over to visit with patient and Dr. Velora Heckler and discuss how treatment went. Pt remains irritable about receiving treatments. I explained she will not always get the same reaction at each visit, even if it's the same dose. Recommend she go home and sleep or just relax. Verbalized understanding from all of them. Pt. will be receiving 2 treatments per week for 4 weeks as recommended. She will get another dose of 84 mg on Wednesday, March  6th. Nurse was with pt a total of 60 minutes for clinical assessment. Pt instructed to call office with any problems or questions before she returns.    LOT [id number] EXP JAN 2027

## 2022-12-07 ENCOUNTER — Other Ambulatory Visit (HOSPITAL_BASED_OUTPATIENT_CLINIC_OR_DEPARTMENT_OTHER): Payer: Self-pay

## 2022-12-07 LAB — OSMOLALITY, URINE: Osmolality, Ur: 336 mOsmol/kg

## 2022-12-08 ENCOUNTER — Other Ambulatory Visit: Payer: Self-pay

## 2022-12-08 ENCOUNTER — Encounter: Payer: Self-pay | Admitting: Psychiatry

## 2022-12-08 ENCOUNTER — Ambulatory Visit (INDEPENDENT_AMBULATORY_CARE_PROVIDER_SITE_OTHER): Payer: Self-pay | Admitting: Psychiatry

## 2022-12-08 ENCOUNTER — Other Ambulatory Visit (HOSPITAL_COMMUNITY): Payer: Self-pay

## 2022-12-08 ENCOUNTER — Ambulatory Visit: Payer: Medicare Other

## 2022-12-08 ENCOUNTER — Other Ambulatory Visit: Payer: Self-pay | Admitting: Psychiatry

## 2022-12-08 ENCOUNTER — Other Ambulatory Visit (HOSPITAL_BASED_OUTPATIENT_CLINIC_OR_DEPARTMENT_OTHER): Payer: Self-pay

## 2022-12-08 VITALS — BP 142/80 | HR 64

## 2022-12-08 DIAGNOSIS — F314 Bipolar disorder, current episode depressed, severe, without psychotic features: Secondary | ICD-10-CM

## 2022-12-08 DIAGNOSIS — F061 Catatonic disorder due to known physiological condition: Secondary | ICD-10-CM

## 2022-12-08 DIAGNOSIS — G3184 Mild cognitive impairment, so stated: Secondary | ICD-10-CM

## 2022-12-08 DIAGNOSIS — F411 Generalized anxiety disorder: Secondary | ICD-10-CM

## 2022-12-08 DIAGNOSIS — Z79899 Other long term (current) drug therapy: Secondary | ICD-10-CM

## 2022-12-08 DIAGNOSIS — G251 Drug-induced tremor: Secondary | ICD-10-CM

## 2022-12-08 DIAGNOSIS — R3589 Other polyuria: Secondary | ICD-10-CM

## 2022-12-08 MED ORDER — CAPLYTA 21 MG PO CAPS
21.0000 mg | ORAL_CAPSULE | Freq: Every evening | ORAL | 0 refills | Status: DC
  Filled 2022-12-08 – 2023-01-08 (×5): qty 30, 30d supply, fill #0

## 2022-12-08 MED ORDER — CLONAZEPAM 0.5 MG PO TABS
1.0000 mg | ORAL_TABLET | Freq: Two times a day (BID) | ORAL | 0 refills | Status: DC
  Filled 2022-12-08: qty 120, 30d supply, fill #0

## 2022-12-08 NOTE — Patient Instructions (Addendum)
DC olanzapine Start Caplyta 42 mg at bedtime Stop Loreev Start clonazepam 1 mg q 12 hours for catatonia

## 2022-12-08 NOTE — Progress Notes (Addendum)
TROYA CIANCIULLI [id number] 10-Nov-1940 82 y.o.  Subjective:   Patient ID:  Meghan Oliver is a 82 y.o. (DOB Mar 25, 1941) female. Patient was last seen August 21, 2018 Chief Complaint:  Chief Complaint  Patient presents with   Follow-up   Depression   Anxiety   Altered Mental Status     Meghan Oliver presents to the office today for follow-up of Severe TR bipolar depression with psychotic features including catatonia.  11/30/2018 was the last visit and the following was noted: Had ECT yesterday at [facility].  Last was Jan 8 and was doing well then.  Next scheduled April 15.  Had a lithium level from there which is pending. Pretty good until the last 10 days to 2 weeks with less energy and motivation.  Bothered by old sick dog.  Worried about how that will affect her.  Has slept with the dog.  Enjoyed [place].  Going to gym and playing [place].  Took the class and didn't feel she caught on quickly. Wonders if it is bc of the ECT memory effects.  Bridge is harder and not playing.  Planning to return to [facility] mid April.   spent the winter in [place] per usual..  No significant depression since here. ECT frequency about 6 weeks.  Gets very anxious the day of the ECT.  ECT consistently for a year.  Best in mood in 7-8 years.  Also the pacemaker helped.  Friends notice the benefit.  Plan:  Option increase in the nortriptyline if needed.  Considered this.  They agree and would like to increase nortriptyline to 75 to try to reduce tendency to depression before the ECT.   03/19/19  Addendum: Here the contents from an email from the nurse for Ms. Meghan Oliver  Hello [name],   I Meghan Oliver you and your family are healthy and well. I took [name] to [facility] for ECT and here are labs from 01/26/19. I am going to also forward them to [facility] for follow up with her LFT's. They are still higher than I would like.  Her lithium level was 0.51 on a daily dose of '300mg'$  qohs x 4 nights- alternating with '450mg'$  qohs x 3 nights. She  is not demonstrating any signs of elevated levels, minimal hand tremors, less anxiety and restlessness since her ECT treatment.  Her Nortryptyline level was 140 on '75mg'$  qhs.  I would like to continue her current dosages of both the above meds and recheck her lithium level at her next scheduled ECT of 03/23/19.  Please let me know of any changes you would like to make. Take care, Meghan Quan RN Lithium level was 0.4 on the '450mg'$  alt with 300 mg QOD. No changes were made in meds.  11/15/2019 phone call: Telephone call from patient's husband Dr. Marga Oliver on 11/14/2019  Patient is scheduled for ECT soon for maintenance ECT.  He thinks it is been 2-1/2 months since the last ECT but he is going to check the date for sure.  It is still be done being done at [facility].  He is wondering about skipping this treatment because the patient has continued to be free of depression and seems cognitively clearer as these ECT treatments have been spread out further from each other.  Patient remains on medications as prescribed.  If it is truly been over 2 months since the last ECT then it is reasonable to consider discontinuing ECT.  It is unlikely that ECT with the frequency of  greater than 2 months is significantly helpful at preventing her relapse.  If however the ECT frequency is less than every 2 months it could still be helping to prevent recurrence.  He indicated he would consider this information and discuss it with her ECT team and make a decision.  They are heading to [place] in the next few weeks.  Needs to schedule an appointment with me for follow-up because it is been many months.  He agrees.   02/19/2020 appt, the following noted: Moving to [place] in a few months.  Ready to downsize.   Stopped ECT as discussed and has not been more deprressed.   Walks dog 4 times daily.   Lithium 0.7 on  01/30/20 on lithium 300 mg plus 150 mg on M, W, F Nortriptyline 70 on 75 mg HS. No SE except tremor.  Balance is  not great.  Very happy.   No depression since here.  No mood swings.  Sleep and appetite is OK.  No med changes:  04/03/2020 TC with the following noted: Patient's husband called stating that her tremor was a little worse and she was having some stutter. No evidence of stroke otherwise. First thing would be to check serum lithium level and BMP.  Order written.  Have asked her husband to let Meghan Oliver know about the lab to which it should be sent.  04/22/20 TC witht he following noted: Lithium level is not dangerously high but it is has crept up to 1.0 which is higher than desired for her.  Our goal for her is 0.5-0.7.  At the current level it is likely causing side effects so we should reduce the dosage from lithium 300 mg plus 150 mg on M, W, F  TO 300 mg daily.  Meaning drop off the 150 mg capsule.  It will take about 2 weeks for the level to gradually come down to the desired level.  05/08/2020 appt with the following noted: Seen with H and [name] nurse. Moving to [facility] and feels OK about it.  I think I'll be fine.   Pretty good with balance.  Doing yoga and it helps. Pricilla Oliver has cancer. Old dog 14 & 1/2 yo still living. Meghan Oliver's concerns when went to [place] for D's BD she had difficulty time with tremor lethargy, more confusion.  Better when go back home. [name] notes using more lorazepam. At times gait is unsteady. Not significantly depressed.  Can be anxious at times.  Eating okay.  Sleeping okay.  Is easily confused under stress. Plan: reduce lithium to 300 mg nightly.  07/14/20 appt with the following noted: Seen with her husband as well as her family nurse. Feeling fine and pleased with that.   Some anxiety over the move to smaller place. Meghan Oliver's twin also having heart problems Meghan Oliver. Meghan Oliver's kids came and visited.    Plan: Continue nortriptyline 75 mg, paroxetine 20 mg, lithium 300 mg, Namenda 10 mg twice daily, lorazepam 0.5 mg 1/2-1 3 times daily as needed, mirtazapine 7.5 mg  nightly  02/27/2021 appointment with the following noted:  Seen with H and nurse Aurora email from nurse Meghan Oliver on 02/24/2021 indicated that he had moved into [facility] in May.  Loreane has gradually become more depressed and more anxious using lorazepam as prescribed 3 times daily.  More memory issues.  She has remained engaged and initiating appropriate conversation.  She is very worried about her 38 year old dog who is in poor health and fears  she will become more depressed when the dog dies.  She and her husband do not wish to prefer to pursue further ECT unless absolutely necessary. Likes [facility] so far.   No depression by her report.  Sold big house in Feb.  It worked out well.   Worries about her dog 65 yo will die soon. H agrees she's done well with depression.   Enjoys things and has interests. Plan: Continue Lithium 300 mg daily  Continue nortriptyline 75 mg daily Continue paroxetine 20 mg daily Continue lorazepam 0.5 mg 3 times daily for both anxiety and catatonia Continue vitamin B6 and primidone for tremor Cerefolin NAC 2 daily.  05/13/2021 appointment with the following noted: Happy with [place].  H and she agree is doing well with depression.Kermit Balo socialization. She complains of memory.   Family visited and getting along with Baker Janus better.  H says Baker Janus is acting better.  Goes to dinner and activities together now.  Better relationship with Meghan Oliver.. Tremor has been OK.   Ativan usually 0.25 mg TID and tolerated. Anxiety managed with this generally. Molly dog health more stable. To [place] before T'giving until April.   Plan: Continue Lithium 300 mg daily  Continue nortriptyline 75 mg daily Continue paroxetine 20 mg daily Continue lorazepam 0.5 mg 3 times daily for both anxiety and catatonia Continue vitamin B6 and primidone for tremor   08/14/2021 appointment with the following noted: Seen with her husband Meghan Oliver and nurse [name]. Everyone reports that  her mood has been stable.  Her anxiety is manageable with as needed lorazepam.  She is still happy with the transition to [facility].  They are preparing to go to [place] for the winter per usual but may sell their house father there. Tolerating meds without any unusual side effects. Is dealing with grief over the loss of her dog Molly.  03/10/2022 appt noted:  seen with H and nurse Vicie Mutters [facility] house. Meghan Oliver not good phsyically hard to play golf.  Been really hard. He'll be 85 in July.  Some stress scheduling with D's.   76 th wedding necessary this month.   Mood up and down.  Sister in law across the street can be difficult and demeaning.  She feels unsettled.  Some worry. Some back pain limiting activity.   Sleep is ok except with pain awakening.   No SE, except tremor some worse about 3-5 and helped by lorazepam 0.25 mg then No greater than 1 mg lorazepam daily Patient denies any recent difficulty with anxiety except as noted.  Patient denies difficulty with sleep initiation or maintenance. Denies appetite disturbance.    Patient has some difficulty with concentration.  Patient denies any suicidal ideation.  Usually takes Ativan at night and sleeps well 8 hours.   Plan no med changes  10/23/213 appt noted: Still doing well. Pneumonia and hosp for 6 days 3 weeks ago.  Viral.  Improving at this time. Increased tremor gradually in 3-4 mos helped by lorazepam avg 1.00-1.25 mg daily. No change in primidone. Total R hip August 2023 and done well.  Finishing home PT.   Appetite poor for 7-8 mos.  2 protein drinks daily but may not eat more than 1 good meal a day. [facility]. Substantial memory problems in hospital and in unusual enviornments. Nurse notices decline.  B with LBD passed in August. No pain meds or muscle relaxants beyond pain. No depression.  H note sometimes she has less motivation than others. [name] notes some normal  waxing and waning of mood. Good social interaction.   Not  spending winter in [facility] this year.  D still lives there. Plan: Increase mirtazapine 15 mg HS for appetite.  Also used for sleep and depression.  09/23/22 appt noted:  with nurse and Meghan Oliver Meghan Oliver feeling bad with more pain and fatigue.  Meghan Oliver has CHF.   Made Kayri sad.  Lost a cgood friends.  Rough time.  Mostly worry about Meghan Oliver.  She feels afraid to be alone if something happens.   Not sig depressed.    Just worry . H can't be as active.  Taking a shower is hard.   She's back exercising again.   Went to [employer] for Thanksgiving in [facility] and plan to go back for Christmas. It's hard to travel. Told nurse she was depressed. Appetite is better and once asleep is OK. Using more lorazepam for anxiety 3-4 daily. Plan: Continue Lithium 300 mg daily  Continue nortriptyline 75 mg daily Continue paroxetine 20 mg daily Continue lorazepam 0.5 mg 3 times daily for both anxiety and catatonia Continue vitamin B6 and primidone for tremor Increased and it helped mirtazapine 15 mg HS for appetite.  Also used for sleep and depression.  10/19/22  RTC  [name] reports not doing well.  Went to [facility].  Recent pneumonia.  They are thinking this is aspiration pneurmonia. Lethargic and anxious with tremor. Ruminating.  Hot to cold .  Low tolerance.  Going downhill quickly.  She's verbalizing depression and desire to isolate.  Won't go out.  Started within the last 2 weeks. Usually doesn't complain when she's sick.   [name]'s biggest concern is that Inocente Salles is having trouble handling this bc she's up at night rambling and not really safe to be up at night.    Will see her 10/22/22 Get lithium and nortriptyline levels.   Lynder Parents, MD, [facility]     10/22/22 emergency appt with nurse [name] and H Meghan Oliver: Pneumonia Oct and then again 2nd week January.  It appears to have largely resoved.  It's really bad.  Nausea. Anxious bc Meghan Oliver ill.  Doesn't want him to leave.  [name] sees anxiety triggering anxiety with little to no appetite.  Hypersensitive to  stimuli like light and noises.  Doesn't want TV on.  Meghan Oliver says totally reversed in last 6 weeks.    Meghan Oliver reports she's overemotional.  Friends died. Losing wt 91.4# today. More trouble with sleep. Lorazepam increased to 2 mg daily.  Not sleepy with it.  Doesn't seem to be helpful. Last week neglect hygiene but better this week a little. [name] says STM is poor at times and at times disoriented if in [place].  Will walk in bathroom and wonder where sh is. Barium swallow pending for evaluation of refulex.    10/26/22 RTC:  RTC   No improvement since Friday.  Gradually less engaged.  Can be overstimulated with visitors.   Disc nortriptyline level 115 is a little higher than it was and higher levels can cause SE in elderly.   Reduce nortriptyline to 50 mg HS. Taking lorazepam    Consider low dose of Vraylar or pramipexole.  Or Spravato.     Lynder Parents, MD, [facility]AP[facility]     11/15/22 appt noted: Dialogue with Dr. Aundra Dubin cardiologist agreed pt should be stable enough from CV perspective to receive Spravato which could elevate BP.  If that occurs use prn metoprolol 25 mg. Disc in detail with Dr. Velora Heckler and he agrees Ebony Cargo is appropriate  though not FDA approved for bipolar depression.  Disc 2 metanalysis in support of this and this alernative is safer and likely better tolerated than the option of ECT and faster than options of trials of Trintellix, Auvelity, Latuda etc. Tolerating meds.  She is depressed and very anxious and intermittently confused.  Taking clothes on and off bc rapid changes in comfort.  Worried over [employer].  Sleep disrupted appetite some better with mirtazapine. Received Spravato 56 mg first time today and was dissociated.  Resolved over 2 hours.  No HA, NV.  Was distressed moderately and very ambivalent about Tx.  H and nurse supportive of tx plan and for the pt.    11/24/22 appt noted: Current meds: nortriptyline 50 mg HS, paroxetine 20 mg daily, lithium 300 mg nightly, mirtazapine  30 mg nightly, memantine 10 mg twice daily.  Lorazepam 0.5 mg every 4 to 6 hours as needed catatonia or severe anxiety. Received first dose of Spravato 84 mg today.  She was more noticeably dissociated than with the 56 mg dose.  It did resolve over the 2-hour course of observation although she remained somewhat unsteady and needed to leave via wheelchair.  She did not have headache nausea or vomiting.  She remains ambivalent about the treatment. Husband notes she has continued to be anxious and depressed and was extremely anxious yesterday and somewhat confused.  At times would say things that did not make sense.  He remained supportive of the treatment while she remains ambivalent about the treatment.  Her appetite is good and she is eating well.  Sleep is variable.  H reported at times will be almost normal after receiving an extra dose of lorazepam 0.5 mg prn but this only lasts an hour or so. Plan: Continue Lithium 300 mg daily  Continue nortriptyline 50 mg daily Continue paroxetine 20 mg daily Continue vitamin B6 and primidone for tremor Imirtazapine 30 mg HS for appetite,  used for sleep and depression.  Appetite better Switch lorazepam to [facility] 2 mg AM and use lorazepam 0.5 mg prn.  For catatonia in hopes of more consistent relief.  Decision based on observation of periods of almost normalcy after some BZ doses.  We discussed potential switch of paroxetine and nortriptyline to Auvelity or Trintellix or possibly [place].  However these switches will be difficult and it is hoped that the Spravato may provide enough improvement to allow a switch in medicine more tolerable.  11/26/22 appt noted: Current meds: nortriptyline 50 mg HS, paroxetine 20 mg daily, lithium 300 mg nightly, mirtazapine 30 mg nightly, memantine 10 mg twice daily.  Lorazepam 0.5 mg every 4 to 6 hours as needed catatonia or severe anxiety.  Started Loreev 2 mg daily over weekend Received Spravato 84 mg today.   Spravato with  expected dissociation.  She didn't experience relief from negative emotion.  Dissociation resolved over observation period.  No N, V, HA.   Still high anxiety and dread of most everything.  Depressed with negative thought predominates.  H notes periods of confusion and then maybe an hour of clarity sometimes after the dosing of lorazepam.  Then back to severe sx.  Eating is fair. No SI.  Fearful generally.  Needs help with ADLs.  Cannot take meds on her own. Tolerating meds without SE No noticeable effect from Loreev in terms of benefit or SE  12/01/22 appt noted: Received a phone call from nurse [name] this morning to call as soon as possible this patient very  agitated and confused.  Thrashing around in bed.  At times talking about dying out of the fear that she was going to die.  Highly anxious.  Not very cooperative with drinking water or taking medicines this morning. Discussed the situation with her husband as well and we have to options: 1's if we can get her to the office to receive Spravato is scheduled today that may provide some immediate relief and forestall and may be prevent hospitalization.  The second option is to proceed directly to hospitalization.  If she is unable to take nutrition and hydration hospitalization will be necessary. Discussed that appeared to be catatonic hyperactivity and agitation which has not responded to the extended release lorazepam 2 mg a day.  Therefore she was given 1 mg of Ativan this morning and did calm down enough to make it to the Spravato administration this afternoon.  She was able to cooperate with Spravato administration and tolerated it well with typical levels of dissociation which gradually resolved over the 2 hours.  She was able to eat and drink fluids prior to coming to the appointment. Current meds: nortriptyline 50 mg HS, paroxetine 20 mg daily, lithium 300 mg nightly,  memantine 10 mg twice daily.  Lorazepam 0.5 mg every 4 to 6 hours as needed  catatonia or severe anxiety.  Started Loreev 2 mg daily over weekend olanazpine 2.5 mg HS Received Spravato 84 mg today.   Husband notes that after Spravato administration on Monday she was much improved for a period of several hours and was able to have conversations with her family members and make decisions about distributing some of her jewelry and other family decisions.  However over Monday evening into Tuesday she became more agitated and confused and anxious again.  There have been no obvious effects of taking the olanzapine but it does not appear that long-acting lorazepam 2 mg as provided any significant relief. Plan: Continue Lithium 300 mg daily  Continue nortriptyline 50 mg daily Continue paroxetine 20 mg daily Continue vitamin B6 and primidone for tremor DC mirtazapine For catatonia: lorazepam0.5 mg q 4 hour mg daily as tolerated for catatonia with dose spread through the day..  Nurse manages. Restart Loreeve 2 mg daily and use prn lorazepam for catatonia Increase olanzapine 5 mg HS for TRD, anxiety, catatonia, delusions of persecution  12/06/22 appt noted; Psych meds: as above except increase Loreev to 2 mg BID for severe catatonia. nortriptyline 50 mg HS, paroxetine  20 mg daily, lithium 300 mg nightly,  memantine 10 mg twice daily.  Lorazepam 0.5 mg every 4 to 6 hours as needed catatonia or severe anxiety.  Started Loreev 2 mg increased BID for severe catatonia,  increased olanazpine 5 mg HS for 3 days. Received 5th dose of Spravato 84 mg today.    She was able to cooperate with Spravato administration and tolerated it well with typical levels of dissociation which gradually resolved over the 2 hours.  She was able to eat and drink fluids prior to coming to the appointment.   She still feels confused and depressed and anxious. Per family had periods over the weekend where she was confused thinking her H was dead even though he was right in front of her.  Also thinking she is going to  die and not get better.  Is eating and drinking fluids well.  Has needed extra lorazepam to sleep.  Limited periods of lucidity per H. Tolerating high dose lorazepam but limited benefit for catatonia. Plan:  Continue Lithium 300 mg daily  reduce nortriptyline 25 mg daily in anticipation of change Reduce paroxetine 10 mg daily in anticipation of change Continue vitamin B6 and primidone for tremor For catatonia: lorazepam0.5 mg q 4 hour mg daily as tolerated for catatonia with dose spread through the day..  Nurse manages. DT severity of catatonic hyperactivy Loreeve 2 mg BID and use prn lorazepam for catatonia Continue olanzapine 5 mg HS for TRD, anxiety, catatonia, delusions of persecution  12/08/22 appt noted: Psych meds:  Loreev to 2 mg BID for severe catatonia. nortriptyline 25 mg HS, paroxetine 10 mg daily, lithium 300 mg nightly,  memantine 10 mg twice daily.  Lorazepam 0.5 mg every 4 to 6 hours as needed catatonia or severe anxiety.  Started Loreev 2 mg increased BID for severe catatonia,  increased olanazpine 5 mg HS for 3 days. Received 6th dose of Spravato 84 mg today.    She was able to cooperate with Spravato administration and tolerated it well with typical levels of dissociation which gradually resolved over the 2 hours.  She was able to eat and drink fluids prior to coming to the appointment.   She still feels confused and depressed and anxious. Hopeless.  Thoughts she is going to die. Per nurse and H no sig improvement noted with spravato.   No current alcohol problems  ECT-MADRS    Flowsheet Row Clinical Support from 12/08/2022 in [place] Total Score 54      PHQ2-9    [facility] Office Visit from 10/11/2022 in [facility] Office Visit from 08/17/2022 in [facility] Office Visit from 07/19/2022 in [facility] Office Visit from 03/24/2022 in [facility] Office Visit from 02/23/2021 in [facility]   PHQ-2 Total Score 6 0 0 1 2  PHQ-9 Total Score -- -- -- -- 2      Flowsheet Row ED to Hosp-Admission (Discharged) from 07/01/2022 in [place] [facility] Admission (Discharged) from 05/18/2022 in [place] [facility] from 05/10/2022 in [place] No Risk No Risk No Risk        Past Psychiatric Medication Trials:  Prozac 40 with lithium and Zyprexa 2.5 to 5 mg,  nortriptyline, paroxetine, Effexor 225 Wellbutrin 300 mirtazapine,  Others  Pramipexole 0.625 daily hallucinations  Rexulti EPS, Abilify 15, Vraylar 1.5 1 week,  Latuda 10 mg SE, Olanzapine 10 briefly   Lamotrigine level 7.5  Namenda,  lorazepam,   this list is not exhaustive  Review of Systems:  Review of Systems  Constitutional:  Positive for fatigue. Negative for appetite change.  Cardiovascular:  Negative for palpitations.  Gastrointestinal:  Positive for constipation and nausea.       Linzess and Mozantic managed   Musculoskeletal:  Positive for gait problem.  Neurological:  Positive for dizziness and weakness. Negative for tremors.       Some balance issues  Psychiatric/Behavioral:  Positive for agitation, confusion, decreased concentration, dysphoric mood and sleep disturbance. Negative for behavioral problems, hallucinations, self-injury and suicidal ideas. The patient is nervous/anxious. The patient is not hyperactive.     Medications: I have reviewed the patient's current medications.  Current Outpatient Medications  Medication Sig Dispense Refill   amoxicillin (AMOXIL) 500 MG capsule Take 3 capsules (1,500 mg total) by mouth 1 hour prior to dental work. 9 capsule 1   aspirin 81 MG  chewable tablet Chew 1 tablet (81 mg total) by mouth daily.     Calcium Carbonate-Vitamin D (CALCIUM 600/VITAMIN D PO) Take 1 tablet by mouth 2 (two) times daily.     cetirizine (ZYRTEC) 10 MG tablet Take 10 mg by  mouth daily as needed for allergies.     Cholecalciferol (VITAMIN D3) 50 MCG (2000 UT) TABS Take 2,000 Units by mouth in the morning.     clonazePAM (KLONOPIN) 0.5 MG tablet Take 2 tablets (1 mg total) by mouth 2 (two) times daily. 120 tablet 0   COVID-19 mRNA vaccine 2023-2024 (COMIRNATY) syringe Inject into the muscle. 0.3 mL 0   dexlansoprazole (DEXILANT) 60 MG capsule Take 1 capsule by mouth daily 90 capsule 3   Esketamine HCl, 84 MG Dose, (SPRAVATO, 84 MG DOSE,) 28 MG/DEVICE SOPK Place 84 mg into the nose every 3 (three) days. 3 each 5   fesoterodine (TOVIAZ) 8 MG TB24 tablet Take 1 tablet (8 mg total) by mouth daily. 90 tablet 3   glycerin adult 2 g suppository Use 1 suppository as needed if you have not had a bowel movement in  4 to 5 days 25 suppository 0   influenza vaccine adjuvanted (FLUAD QUADRIVALENT) 0.5 ML injection Inject into the muscle. 0.5 mL 0   Krill Oil 500 MG CAPS Take 500 mg by mouth daily.     lactulose (CHRONULAC) 10 GM/15ML solution Take 30 mLs (20 g total) by mouth 3 (three) times daily between meals as needed for mild constipation. (Patient taking differently: Take 20 g by mouth daily as needed for mild constipation.) 473 mL 3   Lavender Oil 80 MG CAPS Take 160 mg by mouth at bedtime.     levothyroxine (SYNTHROID) 75 MCG tablet Take 1 tablet (75 mcg total) by mouth daily. 90 tablet 2   levothyroxine (SYNTHROID) 75 MCG tablet Take 1 tablet (75 mcg total) by mouth daily. 90 tablet 2   linaclotide (LINZESS) 72 MCG capsule Take 2 capsules (144 mcg total) by mouth daily before breakfast. 180 capsule 1   lithium carbonate 300 MG capsule Take 1 capsule (300 mg total) by mouth at bedtime. 90 capsule 0   LORazepam (ATIVAN) 0.5 MG tablet TAKE 2 TABLETS (1 MG)  BY MOUTH EVERY NIGHT AT BEDTIME AND 1 TABLET EVERY 4 HOURS AS NEEDED FOR ANXIETY (Patient taking differently: Take 0.5 mg by mouth every 6 (six) hours as needed. TAKE 2 TABLETS (1 MG)  BY MOUTH EVERY NIGHT AT BEDTIME AND 1  TABLET EVERY 4 HOURS AS NEEDED FOR ANXIETY) 270 tablet 0   Lumateperone Tosylate (CAPLYTA) 21 MG CAPS Take 1 capsule by mouth at bedtime. 30 capsule 0   magic mouthwash w/lidocaine SOLN Take 5 mLs by mouth 3 (three) times daily as needed for mouth pain. 120 mL 0   memantine (NAMENDA) 10 MG tablet Take 1 tablet (10 mg total) by mouth 2 (two) times daily. 180 tablet 1   metoprolol tartrate (LOPRESSOR) 25 MG tablet Take 1 tablet (25 mg total) by mouth as needed. 45 tablet 3   Multiple Vitamins-Minerals (MULTIVITAMIN WITH MINERALS) tablet Take 1 tablet by mouth daily.     Multiple Vitamins-Minerals (PRESERVISION AREDS 2 PO) Take 1 capsule by mouth in the morning and at bedtime.     naloxegol oxalate (MOVANTIK) 12.5 MG TABS tablet Take 2 tablets (25 mg total) by mouth daily. (Patient taking differently: Take 25 mg by mouth daily. PRN) 60 tablet 3   nortriptyline (PAMELOR) 25 MG capsule  Take 2 capsules (50 mg total) by mouth at bedtime. (Patient taking differently: Take 25 mg by mouth at bedtime.) 180 capsule 1   ondansetron (ZOFRAN-ODT) 4 MG disintegrating tablet Place 1 tablet every 6 hours by translingual route as needed. 20 tablet 0   PARoxetine (PAXIL) 20 MG tablet Take 1 tablet (20 mg total) by mouth at bedtime. (Patient taking differently: Take 10 mg by mouth at bedtime.) 90 tablet 0   pneumococcal 20-valent conjugate vaccine (PREVNAR 20) 0.5 ML injection Inject into the muscle. 0.5 mL 0   polyethylene glycol (MIRALAX / GLYCOLAX) 17 g packet Take 17 g by mouth daily as needed for mild constipation.     primidone (MYSOLINE) 50 MG tablet TAKE 2 TABLETS BY MOUTH EVERY MORNING AND 1 TABLET EVERY EVENING 270 tablet 0   Pyridoxine HCl (VITAMIN B-6) 500 MG tablet Take 500 mg by mouth 2 (two) times daily.     RSV vaccine recomb adjuvanted (AREXVY) 120 MCG/0.5ML injection Inject into the muscle. 0.5 mL 0   sennosides-docusate sodium (SENOKOT-S) 8.6-50 MG tablet Take 2 tablets by mouth as needed.      valACYclovir (VALTREX) 1000 MG tablet Take 2 tablets by mouth at onset, then 2 tablets 12 hours later. (Patient taking differently: Take 2,000 mg by mouth every 12 (twelve) hours. PRn) 30 tablet 1   vitamin B-12 (CYANOCOBALAMIN) 1000 MCG tablet Take 1,000 mcg by mouth at bedtime.     No current facility-administered medications for this visit.    Medication Side Effects: Other: tremor  stable.  No worse.  Allergies:  Allergies  Allergen Reactions   Propranolol Other (See Comments)    Low blood pressure. Bradycardia    Brexpiprazole Other (See Comments)    Aphasia and catatonia    Past Medical History:  Diagnosis Date   Anxiety    Arthritis    "hips, spine" (03/17/2018)   Bipolar II disorder (HCC)    CHF (congestive heart failure) (HCC)    Chronic bronchitis (HCC)    Chronic lower back pain    Chronic right hip pain    CKD (chronic kidney disease), stage II    Coronary artery disease    stent x1   Esophagitis, erosive    GAD (generalized anxiety disorder)    GERD (gastroesophageal reflux disease)    Headache    "maybe monthly" (03/17/2018))   Heart murmur, systolic    History of adenomatous polyp of colon    08-04-2016  tubular adenoma   History of blood transfusion 12/2017   "related to vascular hematoma"   History of electroconvulsive therapy    at [facility]--  started 04-15-2015 to 11-17-2016  total greater than 40 times   History of hiatal hernia    Hyperlipidemia    Hypertension    Hypothyroidism    Internal carotid artery stenosis, bilateral    per last duplex 05-01-2014  bilateral ICA 40-59%   Major depression, chronic    ECT treatments extensive and multiple started 07/ 2016   Memory loss    "both short and long-term; needs frequent reminders to follow instrucitons" (05/16/2017)   Migraines    "none in years" (03/17/2018)   OSA (obstructive sleep apnea)    per study 06/ 2012 moderate OSA  ; "refuses to wear masks" (03/17/2018)   Osteoporosis    Pneumonia  07/29/2022   Poor historian    due to short term memory loss   Presence of permanent cardiac pacemaker 03/17/2018   Pulmonary nodule  monitored by pcp   S/P placement of cardiac pacemaker 03/17/18 [facility]  03/18/2018   Short-term memory loss    Sick sinus syndrome (HCC)     Family History  Problem Relation Age of Onset   Heart attack Father 55       deceased   Hypertension Father    Heart disease Father    Dementia Brother    Parkinson's disease Brother    Heart disease Brother    Breast cancer Paternal Aunt        Age 11's   Breast cancer Paternal Grandmother        Age unknown   Colon cancer Neg Hx     Social History   Socioeconomic History   Marital status: Married    Spouse name: Dr. Lyla Son   Number of children: 2   Years of education: Not on file   Highest education level: Not on file  Occupational History   Occupation: housewife    Employer: UNEMPLOYED  Tobacco Use   Smoking status: Former    Packs/day: 2.00    Years: 15.00    Total pack years: 30.00    Types: Cigarettes    Quit date: 01/13/1971    Years since quitting: 51.9    Passive exposure: Never   Smokeless tobacco: Never  Vaping Use   Vaping Use: Never used  Substance and Sexual Activity   Alcohol use: Yes    Comment: occassional 1 x a week   Drug use: Never   Sexual activity: Not Currently    Comment: intercourse age 22, sexual partners less than 5  Other Topics Concern   Not on file  Social History Narrative   Right handed   Social Determinants of Health   Financial Resource Strain: Not on file  Food Insecurity: No Food Insecurity (07/06/2022)   Hunger Vital Sign    Worried About Running Out of Food in the Last Year: Never true    Ran Out of Food in the Last Year: Never true  Transportation Needs: No Transportation Needs (07/06/2022)   PRAPARE - Hydrologist (Medical): No    Lack of Transportation (Non-Medical): No  Physical Activity: Not on file   Stress: Not on file  Social Connections: Not on file  Intimate Partner Violence: Not At Risk (07/06/2022)   Humiliation, Afraid, Rape, and Kick questionnaire    Fear of Current or Ex-Partner: No    Emotionally Abused: No    Physically Abused: No    Sexually Abused: No    Past Medical History, Surgical history, Social history, and Family history were reviewed and updated as appropriate.   Please see review of systems for further details on the patient's review from today.   Objective:   Physical Exam:  LMP  (LMP Unknown)   Physical Exam Constitutional:      General: She is not in acute distress.    Appearance: She is well-developed.  Musculoskeletal:        General: No deformity.  Neurological:     Mental Status: She is alert and oriented to person, place, and time.     Motor: No tremor.     Coordination: Coordination abnormal.     Comments: Cane use  Psychiatric:        Attention and Perception: She is attentive.        Mood and Affect: Mood is anxious and depressed. Affect is not labile, blunt or inappropriate.  Speech: Speech is not slurred.        Behavior: Behavior normal. Behavior is not slowed, withdrawn or combative.        Thought Content: Thought content is delusional. Thought content does not include homicidal or suicidal ideation. Thought content does not include suicidal plan.        Cognition and Memory: She exhibits impaired recent memory.        Judgment: Judgment normal.     Comments: Insight fair to good. No auditory or visual hallucinations.  Depression and anxiety are worse lately.  Ruminative  Gait ok. Memory appears worse. Kept eyes closed insession but was more coherent and logical after Spravato than before Prior to Spravato restless putting sweater on and off .  Not able to give history Feels "terrrible" Delusional guilt     Lab Review:     Component Value Date/Time   NA 137 11/11/2022 1106   NA 136 04/20/2017 1151   K 4.6  11/11/2022 1106   CL 106 11/11/2022 1106   CO2 23 11/11/2022 1106   GLUCOSE 97 11/11/2022 1106   BUN 24 11/11/2022 1106   BUN 24 04/20/2017 1151   CREATININE 0.73 11/11/2022 1106   CALCIUM 9.4 11/11/2022 1106   PROT 6.9 11/11/2022 1106   PROT 6.2 04/20/2017 1151   ALBUMIN 3.9 09/02/2022 1221   ALBUMIN 3.9 04/20/2017 1151   AST 44 (H) 11/11/2022 1106   ALT 86 (H) 11/11/2022 1106   ALKPHOS 64 09/02/2022 1221   BILITOT 0.2 11/11/2022 1106   BILITOT <0.2 04/20/2017 1151   GFRNONAA >60 07/08/2022 0429   GFRNONAA 50 (L) 07/10/2020 1021   GFRAA 58 (L) 07/10/2020 1021       Component Value Date/Time   WBC 6.6 11/11/2022 1106   RBC 3.88 11/11/2022 1106   HGB 12.1 11/11/2022 1106   HGB 10.2 (L) 04/20/2017 1151   HCT 36.3 11/11/2022 1106   HCT 29.6 (L) 04/20/2017 1151   PLT 215 11/11/2022 1106   PLT 385 (H) 04/20/2017 1151   MCV 93.6 11/11/2022 1106   MCV 89 04/20/2017 1151   MCH 31.2 11/11/2022 1106   MCHC 33.3 11/11/2022 1106   RDW 13.7 11/11/2022 1106   RDW 13.9 04/20/2017 1151   LYMPHSABS 1,439 11/11/2022 1106   LYMPHSABS 1.5 04/20/2017 1151   MONOABS 0.7 07/07/2022 0458   EOSABS 112 11/11/2022 1106   EOSABS 0.3 04/20/2017 1151   BASOSABS 53 11/11/2022 1106   BASOSABS 0.0 04/20/2017 1151    Lithium Lvl  Date Value Ref Range Status  10/21/2022 0.5 (L) 0.6 - 1.2 mmol/L Final  10/21/2022 lithium level 0.5 on 300 mg nightly is stable.,  Nortriptyline 115 on 75 mg HS.  08/02/22 nortriptyline 85, lihtium 0.5  Nortriptyline level 51 on 50 mg nightly with paroxetine 20 mg daily  07/10/2020 lithium level 0.6 at [facility] office and nortriptyline level 130 on the current dosages of lithium 300 mg nightly and nortriptyline 75 mg nightly  02/20/2021 labs lithium 0.6 on 300 mg nightly.  Nortriptyline level 49 which is lower than expected on 75 mg nightly along with paroxetine 20 mg daily.    No results found for: "PHENYTOIN", "PHENOBARB", "VALPROATE", "CBMZ"    .res Assessment: Plan:    Severe bipolar I disorder, current or most recent episode depressed, with catatonia (HCC)  Generalized anxiety disorder  Mild cognitive impairment  Polyuria  Lithium-induced tremor  Lithium use    History of lithium toxicity repeatedly while on chlorthalidone.  Hx TX resistant bipolar depression with catatonia.  Recent decompensation and rapidly getting worse with some cognive impairment and mild psychotic sx and altered mental status.Depression and anxiety much worse in the last several weeks.  Her case has been highly complex and treatment resistant at times. Disc pros and cons of return to ECT.  Dialogue with Dr. Aundra Dubin cardiologist agreed pt should be stable enough from CV perspective to receive Spravato which could elevate BP.  If that occurs use prn metoprolol 25 mg. Disc in detail with Dr. Velora Heckler and he agrees Ebony Cargo is appropriate though not FDA approved for bipolar depression.  Disc 2 metanalysis in support of this and this alernative is safer and likely better tolerated than the option of ECT and faster than options of trials of Trintellix, Auvelity, Latuda etc. BP has been ok.    However not responding to Spravato after 6 tx of 84 mg and 2 of 56 mg .  Has had previous sessions after which she had some coherence but only last ed a few hours at most..  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  During the severe depression she has catatonia.  She had required longterm ECT.  A previous episode of depression approximately 10 years ago stayed in remission on Paxil and nortriptyline and lithium for a period of years until the lithium level was decreased due to tremor.  Now we are maintaining an adequate lithium level.  She has had several episodes of lithium toxicity with hospitalization.  This resolved once she stopped chlorthalidone.  From records it appears her last ECT was in September 2020.    Patient  was administered Spravato 84 mg intranasally today.  The patient experienced the typical dissociation which gradually resolved over the 2-hour period of observation but she remains weak and inattentive.  There were no complications.  Specifically the patient did not have nausea or vomiting or headache.  Blood pressures normal at 40-minute and 2-hour follow-up intervals.  By the time the 2-hour observation period was met the patient was alert and oriented and able to exit without assistance.  Patient feels the Spravato administration is helpful for the treatment resistant depression and would like to continue the treatment.  See nursing note for further details.  Reviewed the old chart and consider augmentation with very low-dose Vraylar or perhaps pramipexole but risk latter could worsen psychosis.  She seems to be tolerating the meds well other than the lithium tremor which is being fairly  managed with primidone.  She is continuing under the care of her neurologist Dr. Carles Collet.  The nortriptyline was increased to 75 to try to reduce tendency to depression in February 2020.  08/02/22 nortriptyline 85, lihtium 0.5 Received 10/26/22= nortriptyline 115.    Lithium level goals are between 0.5 and 0.7.  She has a history of lithium toxicity when she was taking chlorthalidone with the lithium.   Discussed signs and symptoms of lithium toxicity.  Discussed the risk of increasing depression if the lithium level gets too low.  She cannot tolerate higher doses of lithium.   EUMO to evaluate polyuria. 336 ([phone])    She had one in Jan 2024 Consider amiloride.  Disc at length to reduce thirst complaints.  Decision to hold for now  There remains some concern about drug interactions between paroxetine and nortriptyline and given her age we do not want the nortriptyline levels higher than necessary .  Disc risk polypharmacy  Continue Lithium 300 mg  daily  reduced nortriptyline 25 mg daily in anticipation of  change on 12/06/22 Reduced paroxetine 10 mg daily in anticipation of change on 12/06/22 Continue vitamin B6 and primidone for tremor For catatonia: lorazepam 0.5 mg q 4 hour mg prn emergency  as tolerated for catatonia with dose spread through the day..  Nurse manages.  DC olanzapine Start Caplyta 42 mg at bedtime.  Disc using the lower dose but it is $450 so they want to go ahead and start the full normal dose per FDA rec. Stop Loreev DT NR Start clonazepam 1 mg q 12 hours for catatonia  Conisder switch olanzapine but it is helping appetite We discussed potential switch of paroxetine and nortriptyline to Auvelity or Trintellix or possibly Latuda.  Option stimulant.  Pramipexole 1 mg daily. Extensive review prior meds with nurse and 3 volumes of charts. However these switches will be difficult and it is hoped that the Spravato may provide enough improvement to allow a switch in medicine more tolerable.  Hold Spravato 84 mg and rec ECT for severe TRD with catatonia at [facility] where she had it before  Lynder Parents MD, DFAPA Please see After Visit Summary for patient specific instructions.  Future Appointments  Date Time Provider [place]  12/20/2022  1:00 PM Shanon Ace, LCSW [facility] None  01/03/2023  3:20 PM [facility] DEVICE REMOTES [facility]  01/10/2023  1:00 PM Shanon Ace, LCSW [facility] None  01/18/2023  9:30 AM Mauri Pole, MD [facility]  04/04/2023  3:20 PM [facility] DEVICE REMOTES [facility]  07/04/2023  3:20 PM [facility] DEVICE REMOTES [facility]  10/03/2023  7:00 AM [facility] DEVICE REMOTES [facility]      No orders of the defined types were placed in this encounter.      -------------------------------

## 2022-12-09 ENCOUNTER — Other Ambulatory Visit (HOSPITAL_COMMUNITY): Payer: Self-pay

## 2022-12-10 DIAGNOSIS — Z1152 Encounter for screening for COVID-19: Secondary | ICD-10-CM | POA: Diagnosis not present

## 2022-12-10 DIAGNOSIS — R7401 Elevation of levels of liver transaminase levels: Secondary | ICD-10-CM | POA: Diagnosis not present

## 2022-12-10 DIAGNOSIS — R079 Chest pain, unspecified: Secondary | ICD-10-CM | POA: Diagnosis not present

## 2022-12-10 DIAGNOSIS — I5032 Chronic diastolic (congestive) heart failure: Secondary | ICD-10-CM | POA: Diagnosis not present

## 2022-12-10 DIAGNOSIS — Z96641 Presence of right artificial hip joint: Secondary | ICD-10-CM | POA: Diagnosis not present

## 2022-12-10 DIAGNOSIS — R1084 Generalized abdominal pain: Secondary | ICD-10-CM | POA: Diagnosis not present

## 2022-12-10 DIAGNOSIS — K219 Gastro-esophageal reflux disease without esophagitis: Secondary | ICD-10-CM | POA: Diagnosis not present

## 2022-12-10 DIAGNOSIS — Z681 Body mass index (BMI) 19 or less, adult: Secondary | ICD-10-CM | POA: Diagnosis not present

## 2022-12-10 DIAGNOSIS — I130 Hypertensive heart and chronic kidney disease with heart failure and stage 1 through stage 4 chronic kidney disease, or unspecified chronic kidney disease: Secondary | ICD-10-CM | POA: Diagnosis not present

## 2022-12-10 DIAGNOSIS — E039 Hypothyroidism, unspecified: Secondary | ICD-10-CM | POA: Diagnosis not present

## 2022-12-10 DIAGNOSIS — E785 Hyperlipidemia, unspecified: Secondary | ICD-10-CM | POA: Diagnosis not present

## 2022-12-10 DIAGNOSIS — N182 Chronic kidney disease, stage 2 (mild): Secondary | ICD-10-CM | POA: Diagnosis not present

## 2022-12-10 DIAGNOSIS — F410 Panic disorder [episodic paroxysmal anxiety] without agoraphobia: Secondary | ICD-10-CM | POA: Diagnosis not present

## 2022-12-10 DIAGNOSIS — F3176 Bipolar disorder, in full remission, most recent episode depressed: Secondary | ICD-10-CM | POA: Diagnosis not present

## 2022-12-10 DIAGNOSIS — E43 Unspecified severe protein-calorie malnutrition: Secondary | ICD-10-CM | POA: Diagnosis not present

## 2022-12-10 DIAGNOSIS — F332 Major depressive disorder, recurrent severe without psychotic features: Secondary | ICD-10-CM | POA: Diagnosis not present

## 2022-12-10 DIAGNOSIS — F319 Bipolar disorder, unspecified: Secondary | ICD-10-CM | POA: Diagnosis not present

## 2022-12-10 DIAGNOSIS — R109 Unspecified abdominal pain: Secondary | ICD-10-CM | POA: Diagnosis not present

## 2022-12-10 DIAGNOSIS — N3281 Overactive bladder: Secondary | ICD-10-CM | POA: Diagnosis not present

## 2022-12-10 DIAGNOSIS — E8809 Other disorders of plasma-protein metabolism, not elsewhere classified: Secondary | ICD-10-CM | POA: Diagnosis not present

## 2022-12-10 DIAGNOSIS — K5900 Constipation, unspecified: Secondary | ICD-10-CM | POA: Diagnosis not present

## 2022-12-10 DIAGNOSIS — K3184 Gastroparesis: Secondary | ICD-10-CM | POA: Diagnosis not present

## 2022-12-10 DIAGNOSIS — F418 Other specified anxiety disorders: Secondary | ICD-10-CM | POA: Diagnosis not present

## 2022-12-10 DIAGNOSIS — G4700 Insomnia, unspecified: Secondary | ICD-10-CM | POA: Diagnosis not present

## 2022-12-10 DIAGNOSIS — F3170 Bipolar disorder, currently in remission, most recent episode unspecified: Secondary | ICD-10-CM | POA: Diagnosis not present

## 2022-12-10 DIAGNOSIS — I10 Essential (primary) hypertension: Secondary | ICD-10-CM | POA: Diagnosis not present

## 2022-12-10 DIAGNOSIS — F315 Bipolar disorder, current episode depressed, severe, with psychotic features: Secondary | ICD-10-CM | POA: Diagnosis not present

## 2022-12-10 DIAGNOSIS — R140 Abdominal distension (gaseous): Secondary | ICD-10-CM | POA: Diagnosis not present

## 2022-12-10 DIAGNOSIS — K581 Irritable bowel syndrome with constipation: Secondary | ICD-10-CM | POA: Diagnosis not present

## 2022-12-10 DIAGNOSIS — M810 Age-related osteoporosis without current pathological fracture: Secondary | ICD-10-CM | POA: Diagnosis not present

## 2022-12-10 DIAGNOSIS — R0789 Other chest pain: Secondary | ICD-10-CM | POA: Diagnosis not present

## 2022-12-10 DIAGNOSIS — Z79899 Other long term (current) drug therapy: Secondary | ICD-10-CM | POA: Diagnosis not present

## 2022-12-10 DIAGNOSIS — I252 Old myocardial infarction: Secondary | ICD-10-CM | POA: Diagnosis not present

## 2022-12-10 DIAGNOSIS — G4730 Sleep apnea, unspecified: Secondary | ICD-10-CM | POA: Diagnosis not present

## 2022-12-10 DIAGNOSIS — F3130 Bipolar disorder, current episode depressed, mild or moderate severity, unspecified: Secondary | ICD-10-CM | POA: Diagnosis not present

## 2022-12-10 DIAGNOSIS — I2510 Atherosclerotic heart disease of native coronary artery without angina pectoris: Secondary | ICD-10-CM | POA: Diagnosis not present

## 2022-12-10 DIAGNOSIS — F061 Catatonic disorder due to known physiological condition: Secondary | ICD-10-CM | POA: Diagnosis not present

## 2022-12-16 ENCOUNTER — Other Ambulatory Visit (HOSPITAL_COMMUNITY): Payer: Self-pay

## 2022-12-17 ENCOUNTER — Other Ambulatory Visit (HOSPITAL_COMMUNITY): Payer: Self-pay

## 2022-12-17 ENCOUNTER — Other Ambulatory Visit: Payer: Self-pay

## 2022-12-17 DIAGNOSIS — F319 Bipolar disorder, unspecified: Secondary | ICD-10-CM | POA: Diagnosis not present

## 2022-12-17 DIAGNOSIS — F3170 Bipolar disorder, currently in remission, most recent episode unspecified: Secondary | ICD-10-CM | POA: Diagnosis not present

## 2022-12-17 DIAGNOSIS — F061 Catatonic disorder due to known physiological condition: Secondary | ICD-10-CM | POA: Diagnosis not present

## 2022-12-17 DIAGNOSIS — R079 Chest pain, unspecified: Secondary | ICD-10-CM | POA: Diagnosis not present

## 2022-12-20 ENCOUNTER — Ambulatory Visit: Payer: Medicare Other | Admitting: Psychiatry

## 2022-12-20 DIAGNOSIS — F319 Bipolar disorder, unspecified: Secondary | ICD-10-CM | POA: Diagnosis not present

## 2022-12-20 DIAGNOSIS — F061 Catatonic disorder due to known physiological condition: Secondary | ICD-10-CM | POA: Diagnosis not present

## 2022-12-20 DIAGNOSIS — R079 Chest pain, unspecified: Secondary | ICD-10-CM | POA: Diagnosis not present

## 2022-12-20 DIAGNOSIS — F3170 Bipolar disorder, currently in remission, most recent episode unspecified: Secondary | ICD-10-CM | POA: Diagnosis not present

## 2022-12-21 ENCOUNTER — Other Ambulatory Visit (HOSPITAL_BASED_OUTPATIENT_CLINIC_OR_DEPARTMENT_OTHER): Payer: Self-pay

## 2022-12-21 ENCOUNTER — Other Ambulatory Visit: Payer: Self-pay

## 2022-12-22 DIAGNOSIS — F319 Bipolar disorder, unspecified: Secondary | ICD-10-CM | POA: Diagnosis not present

## 2022-12-22 DIAGNOSIS — R079 Chest pain, unspecified: Secondary | ICD-10-CM | POA: Diagnosis not present

## 2022-12-22 DIAGNOSIS — F061 Catatonic disorder due to known physiological condition: Secondary | ICD-10-CM | POA: Diagnosis not present

## 2022-12-22 DIAGNOSIS — F3170 Bipolar disorder, currently in remission, most recent episode unspecified: Secondary | ICD-10-CM | POA: Diagnosis not present

## 2022-12-24 DIAGNOSIS — F319 Bipolar disorder, unspecified: Secondary | ICD-10-CM | POA: Diagnosis not present

## 2022-12-24 DIAGNOSIS — F061 Catatonic disorder due to known physiological condition: Secondary | ICD-10-CM | POA: Diagnosis not present

## 2022-12-24 DIAGNOSIS — R079 Chest pain, unspecified: Secondary | ICD-10-CM | POA: Diagnosis not present

## 2022-12-24 DIAGNOSIS — F3170 Bipolar disorder, currently in remission, most recent episode unspecified: Secondary | ICD-10-CM | POA: Diagnosis not present

## 2022-12-27 DIAGNOSIS — F061 Catatonic disorder due to known physiological condition: Secondary | ICD-10-CM | POA: Diagnosis not present

## 2022-12-27 DIAGNOSIS — F319 Bipolar disorder, unspecified: Secondary | ICD-10-CM | POA: Diagnosis not present

## 2022-12-27 DIAGNOSIS — F3170 Bipolar disorder, currently in remission, most recent episode unspecified: Secondary | ICD-10-CM | POA: Diagnosis not present

## 2022-12-27 DIAGNOSIS — R079 Chest pain, unspecified: Secondary | ICD-10-CM | POA: Diagnosis not present

## 2022-12-29 DIAGNOSIS — R079 Chest pain, unspecified: Secondary | ICD-10-CM | POA: Diagnosis not present

## 2022-12-29 DIAGNOSIS — F3170 Bipolar disorder, currently in remission, most recent episode unspecified: Secondary | ICD-10-CM | POA: Diagnosis not present

## 2022-12-29 DIAGNOSIS — F319 Bipolar disorder, unspecified: Secondary | ICD-10-CM | POA: Diagnosis not present

## 2022-12-29 DIAGNOSIS — F061 Catatonic disorder due to known physiological condition: Secondary | ICD-10-CM | POA: Diagnosis not present

## 2022-12-29 LAB — BASIC METABOLIC PANEL
BUN: 12 (ref 4–21)
CO2: 25 — AB (ref 13–22)
Chloride: 105 (ref 99–108)
Creatinine: 0.8 (ref 0.5–1.1)
Glucose: 96
Sodium: 137 (ref 137–147)

## 2022-12-29 LAB — CBC AND DIFFERENTIAL
HCT: 32 — AB (ref 36–46)
Hemoglobin: 10.3 — AB (ref 12.0–16.0)
Platelets: 231 10*3/uL (ref 150–400)
WBC: 6.3

## 2022-12-29 LAB — COMPREHENSIVE METABOLIC PANEL: Calcium: 8.7 (ref 8.7–10.7)

## 2022-12-30 ENCOUNTER — Other Ambulatory Visit (HOSPITAL_COMMUNITY): Payer: Self-pay

## 2022-12-31 ENCOUNTER — Other Ambulatory Visit (HOSPITAL_COMMUNITY): Payer: Self-pay

## 2022-12-31 ENCOUNTER — Other Ambulatory Visit: Payer: Self-pay

## 2022-12-31 DIAGNOSIS — F061 Catatonic disorder due to known physiological condition: Secondary | ICD-10-CM | POA: Diagnosis not present

## 2022-12-31 DIAGNOSIS — R079 Chest pain, unspecified: Secondary | ICD-10-CM | POA: Diagnosis not present

## 2022-12-31 DIAGNOSIS — F319 Bipolar disorder, unspecified: Secondary | ICD-10-CM | POA: Diagnosis not present

## 2022-12-31 DIAGNOSIS — F3170 Bipolar disorder, currently in remission, most recent episode unspecified: Secondary | ICD-10-CM | POA: Diagnosis not present

## 2023-01-03 ENCOUNTER — Other Ambulatory Visit (HOSPITAL_BASED_OUTPATIENT_CLINIC_OR_DEPARTMENT_OTHER): Payer: Self-pay

## 2023-01-03 DIAGNOSIS — F319 Bipolar disorder, unspecified: Secondary | ICD-10-CM | POA: Diagnosis not present

## 2023-01-03 DIAGNOSIS — R079 Chest pain, unspecified: Secondary | ICD-10-CM | POA: Diagnosis not present

## 2023-01-03 DIAGNOSIS — F3170 Bipolar disorder, currently in remission, most recent episode unspecified: Secondary | ICD-10-CM | POA: Diagnosis not present

## 2023-01-03 DIAGNOSIS — F061 Catatonic disorder due to known physiological condition: Secondary | ICD-10-CM | POA: Diagnosis not present

## 2023-01-03 MED ORDER — LITHIUM CARBONATE 300 MG PO CAPS: 300.0000 mg | ORAL_CAPSULE | Freq: Every day | ORAL | 0 refills | Status: DC

## 2023-01-03 MED ORDER — VITAMIN B-1 100 MG PO TABS
100.0000 mg | ORAL_TABLET | Freq: Every day | ORAL | 0 refills | Status: DC
  Filled 2023-01-03: qty 100, 100d supply, fill #0

## 2023-01-03 MED ORDER — LOSARTAN POTASSIUM 100 MG PO TABS
100.0000 mg | ORAL_TABLET | Freq: Every day | ORAL | 0 refills | Status: DC
  Filled 2023-01-03 – 2023-01-04 (×2): qty 30, 30d supply, fill #0

## 2023-01-03 MED ORDER — PAROXETINE HCL 20 MG PO TABS: 20.0000 mg | ORAL_TABLET | Freq: Every day | ORAL | 0 refills | Status: DC

## 2023-01-03 MED ORDER — LEVOTHYROXINE SODIUM 75 MCG PO TABS
75.0000 ug | ORAL_TABLET | Freq: Every day | ORAL | 2 refills | Status: DC
  Filled 2023-01-04 – 2023-02-01 (×2): qty 90, 90d supply, fill #0
  Filled 2023-04-20: qty 90, 90d supply, fill #1
  Filled 2023-07-19: qty 90, 90d supply, fill #2

## 2023-01-03 MED ORDER — PRIMIDONE 50 MG PO TABS
100.0000 mg | ORAL_TABLET | ORAL | 0 refills | Status: DC
  Filled 2023-01-04 – 2023-02-01 (×2): qty 270, 90d supply, fill #0

## 2023-01-04 ENCOUNTER — Encounter: Payer: Self-pay | Admitting: Internal Medicine

## 2023-01-04 ENCOUNTER — Other Ambulatory Visit (HOSPITAL_COMMUNITY): Payer: Self-pay

## 2023-01-04 ENCOUNTER — Telehealth: Payer: Self-pay

## 2023-01-04 ENCOUNTER — Other Ambulatory Visit: Payer: Self-pay | Admitting: Psychiatry

## 2023-01-04 ENCOUNTER — Telehealth: Payer: Self-pay | Admitting: Internal Medicine

## 2023-01-04 ENCOUNTER — Other Ambulatory Visit (HOSPITAL_BASED_OUTPATIENT_CLINIC_OR_DEPARTMENT_OTHER): Payer: Self-pay

## 2023-01-04 ENCOUNTER — Other Ambulatory Visit: Payer: Self-pay

## 2023-01-04 MED ORDER — LURASIDONE HCL 60 MG PO TABS
1.0000 | ORAL_TABLET | Freq: Every day | ORAL | 0 refills | Status: DC
  Filled 2023-01-04: qty 30, 30d supply, fill #0

## 2023-01-04 NOTE — Telephone Encounter (Signed)
Bell  [name] called to schedule Joellen for a Hospital follow up, she was discharged from [facility] yesterday. We received the D/C summary via fax. I went ahead and scheduled for 01/11/2023. Let [name] know Colin Broach would call tomorrow to go over discharge and make TOC call, she was okay with that.

## 2023-01-04 NOTE — Telephone Encounter (Signed)
Spoke with Meghan Oliver and she stated No Home health agency has been determined and she will use [facility] for now, and possibly continue with [facility].  Patient is also receiving 24 hr care.

## 2023-01-04 NOTE — Transitions of Care (Post Inpatient/ED Visit) (Signed)
   01/04/2023  Name: Meghan Oliver MRN: [id number] DOB: 1941/07/31  Today's TOC FU Call Status: Today's TOC FU Call Status:: Successful TOC FU Call Competed  Transition Care Management Follow-up Telephone Call Date of Discharge: 01/03/23 Discharge Facility: Other ([place]) Name of Other ([place]) Discharge Facility: [facility] Type of Discharge: Inpatient Admission Primary Inpatient Discharge Diagnosis:: Bipolar disorder How have you been since you were released from the hospital?:  [facility]) Any questions or concerns?: No  Items Reviewed: Did you receive and understand the discharge instructions provided?: Yes Medications obtained and verified?: Yes (Medications Reviewed) Any new allergies since your discharge?: No Dietary orders reviewed?: Yes Do you have support at home?: Yes Name of Support/Comfort Primary Source: [name] and [place] and Equipment/Supplies: [place] Ordered?: No Any new equipment or medical supplies ordered?: No  Functional Questionnaire: Do you need assistance with bathing/showering or dressing?:  (some) Do you need assistance with meal preparation?: No Do you need assistance with eating?: Yes Do you have difficulty maintaining continence: No Do you need assistance with getting out of bed/getting out of a chair/moving?: No Do you have difficulty managing or taking your medications?: No  Follow up appointments reviewed: PCP Follow-up appointment confirmed?: Yes Date of PCP follow-up appointment?: 01/11/23 Follow-up Provider: Dr. Shelton Silvas Follow-up appointment confirmed?: No Do you need transportation to your follow-up appointment?: No   SIGNATURE Reign Bartnick D, CMA

## 2023-01-05 ENCOUNTER — Telehealth: Payer: Self-pay | Admitting: Psychiatry

## 2023-01-05 ENCOUNTER — Other Ambulatory Visit: Payer: Self-pay

## 2023-01-05 ENCOUNTER — Other Ambulatory Visit (HOSPITAL_BASED_OUTPATIENT_CLINIC_OR_DEPARTMENT_OTHER): Payer: Self-pay

## 2023-01-05 DIAGNOSIS — F061 Catatonic disorder due to known physiological condition: Secondary | ICD-10-CM

## 2023-01-05 DIAGNOSIS — F329 Major depressive disorder, single episode, unspecified: Secondary | ICD-10-CM | POA: Diagnosis not present

## 2023-01-05 DIAGNOSIS — F3289 Other specified depressive episodes: Secondary | ICD-10-CM | POA: Diagnosis not present

## 2023-01-05 MED ORDER — LURASIDONE HCL 60 MG PO TABS
1.0000 | ORAL_TABLET | Freq: Every day | ORAL | 0 refills | Status: DC
Start: 2023-01-05 — End: 2023-03-21
  Filled 2023-01-05: qty 30, 30d supply, fill #0
  Filled 2023-01-29: qty 30, 30d supply, fill #1
  Filled 2023-02-17 – 2023-02-28 (×2): qty 30, 30d supply, fill #2

## 2023-01-05 NOTE — Telephone Encounter (Signed)
Prior Authorization initiated for urgent review for Lurasidone 60 mg tablets #90/90 day with [facility].  Unsure why didn't get initiated with pharmacy, but pending response currently.

## 2023-01-05 NOTE — Telephone Encounter (Signed)
Pt needs new Rx for Latuda 60 mg 1/d after supper. [place]. Need PA. Pt out tomorrow.

## 2023-01-05 NOTE — Telephone Encounter (Signed)
Noted this has already been discussed and in process

## 2023-01-05 NOTE — Telephone Encounter (Signed)
Prior Approval received for Lurasidone 60 mg #90/90 with [employer] effective through 01/05/2024

## 2023-01-05 NOTE — Telephone Encounter (Signed)
[name] called and said that Meghan Oliver needs to be worked on Friday April 12 th if possible. Please let admin staff know if that is ok so we can call kim and schedule

## 2023-01-06 ENCOUNTER — Other Ambulatory Visit (HOSPITAL_BASED_OUTPATIENT_CLINIC_OR_DEPARTMENT_OTHER): Payer: Self-pay

## 2023-01-06 MED ORDER — AMLODIPINE BESYLATE 10 MG PO TABS
10.0000 mg | ORAL_TABLET | Freq: Every day | ORAL | 0 refills | Status: DC
  Filled 2023-01-06: qty 30, 30d supply, fill #0

## 2023-01-06 MED ORDER — HYDROCHLOROTHIAZIDE 12.5 MG PO TABS
12.5000 mg | ORAL_TABLET | Freq: Every day | ORAL | 0 refills | Status: DC
  Filled 2023-01-06: qty 30, 30d supply, fill #0

## 2023-01-06 NOTE — Telephone Encounter (Signed)
OK work in Friday 4/12.  Call nurse [name] to arrange this

## 2023-01-06 NOTE — Telephone Encounter (Signed)
There seems to be multiple phone messages out about this.

## 2023-01-06 NOTE — Telephone Encounter (Signed)
Noted I will contact [name]

## 2023-01-07 ENCOUNTER — Other Ambulatory Visit (HOSPITAL_COMMUNITY): Payer: Self-pay

## 2023-01-07 ENCOUNTER — Other Ambulatory Visit (HOSPITAL_BASED_OUTPATIENT_CLINIC_OR_DEPARTMENT_OTHER): Payer: Self-pay

## 2023-01-07 NOTE — Progress Notes (Signed)
Patient Care Team: Margaree Mackintosh, MD as PCP - General (Internal Medicine) Laurey Morale, MD as PCP - Cardiology (Cardiology) Marinus Maw, MD as PCP - Electrophysiology (Cardiology) Herby Abraham, MD (Cardiology) Cottle, Steva Ready., MD (Psychiatry) Trellis Paganini, MD (Inactive) (Obstetrics and Gynecology)  Visit Date: 01/14/23  Subjective:    Patient ID: Meghan Oliver , Female   DOB: 07-08-1941, 82 y.o.    MRN: [id number]   82 y.o. Female presents today for a hospital follow-up. She is accompanied by Chinita Greenland, R.N. Patient unfortunately has had a back set with regard to depression and anxiety, Has been extremely worried about family, especially her husband. Seen recently at [facility]  History of depression, Bipolar I disorder. She was having problems with apathy at the beginning of 2024. Admitted to [facility]  12/10/22 - 01/03/23 for catatonia, bipolar disorder, severe depression.  Nurse reports her weight was as low as 83 pounds due to diminished appetite, now back up to 101 pounds here today. Received ECT therapy which she previously had a few years ago and worked for her. Paxil was discontinued as was lithium, nortriptyline, and lorazepam.   Taking Memantine 10 mg twice daily for mild memory loss.  Now on primidone. Lithium was discontinued as well as Pamelor and Paxil.  History of chronic constipation treated with linaclotide 144 mcg daily before breakfast.  Denies urinary symptoms.  Hx of GE reflux/hiatal hernia, sleep apnea, osteoporosis, anxiety, osteoarthritis, hyperlipidemia, chronic constipation, hypothyroidism  04/20/22 carotid arterial duplex study showed right carotid velocities in the right and left ICA are consistent with a 1-39% stenosis, antegrade flow in bilateral vertebral arteries, normal flow in bilateral subclavian arteries.  Past Medical History:  Diagnosis Date   Anxiety    Arthritis    "hips, spine" (03/17/2018)   Bipolar II  disorder    CHF (congestive heart failure)    Chronic bronchitis    Chronic lower back pain    Chronic right hip pain    CKD (chronic kidney disease), stage II    Coronary artery disease    stent x1   Esophagitis, erosive    GAD (generalized anxiety disorder)    GERD (gastroesophageal reflux disease)    Headache    "maybe monthly" (03/17/2018))   Heart murmur, systolic    History of adenomatous polyp of colon    08-04-2016  tubular adenoma   History of blood transfusion 12/2017   "related to vascular hematoma"   History of electroconvulsive therapy    at [facility]--  started 04-15-2015 to 11-17-2016  total greater than 40 times   History of hiatal hernia    Hyperlipidemia    Hypertension    Hypothyroidism    Internal carotid artery stenosis, bilateral    per last duplex 05-01-2014  bilateral ICA 40-59%   Major depression, chronic    ECT treatments extensive and multiple started 07/ 2016   Memory loss    "both short and long-term; needs frequent reminders to follow instrucitons" (05/16/2017)   Migraines    "none in years" (03/17/2018)   OSA (obstructive sleep apnea)    per study 06/ 2012 moderate OSA  ; "refuses to wear masks" (03/17/2018)   Osteoporosis    Pneumonia 07/29/2022   Poor historian    due to short term memory loss   Presence of permanent cardiac pacemaker 03/17/2018   Pulmonary nodule    monitored by pcp   S/P placement of cardiac pacemaker  03/17/18 [facility]  03/18/2018   Short-term memory loss    Sick sinus syndrome      Family History  Problem Relation Age of Onset   Heart attack Father 54       deceased   Hypertension Father    Heart disease Father    Dementia Brother    Parkinson's disease Brother    Heart disease Brother    Breast cancer Paternal Aunt        Age 81's   Breast cancer Paternal Grandmother        Age unknown   Colon cancer Neg Hx     She was hospitalized in September 2023 with temporal headache, cough and chills and was diagnosed with  pneumonia.  Her sed rate was elevated and she had a temporal artery biopsy which was negative for temporal arteritis. For respiratory infection,  she was treated with steroids, Rocephin and azithromycin. Had follow up with Pulmonary. Subsequently recovered from that illness and spent some time over the holidays in [place].  Underwent right hip arthroplasty in August 2023 by Dr. Charlann Boxer.       Review of Systems  Constitutional:  Negative for fever and malaise/fatigue.  HENT:  Negative for congestion.   Eyes:  Negative for blurred vision.  Respiratory:  Negative for cough and shortness of breath.   Cardiovascular:  Negative for chest pain, palpitations and leg swelling.  Gastrointestinal:  Negative for vomiting.  Genitourinary:  Negative for dysuria, flank pain, frequency, hematuria and urgency.  Musculoskeletal:  Negative for back pain.  Skin:  Negative for rash.  Neurological:  Negative for loss of consciousness and headaches.        Objective:   Vitals: BP (!) 112/50   Pulse 68   Temp 98 F (36.7 C) (Tympanic)   Ht 4\' 10"  (1.473 m)   Wt 101 lb 12.8 oz (46.2 kg)   LMP  (LMP Unknown)   SpO2 97%   BMI 21.28 kg/m    Physical Exam Vitals and nursing note reviewed.  Constitutional:      General: She is not in acute distress.    Appearance: Normal appearance. She is not toxic-appearing.  HENT:     Head: Normocephalic and atraumatic.  Cardiovascular:     Rate and Rhythm: Normal rate and regular rhythm. No extrasystoles are present.    Pulses: Normal pulses.     Heart sounds: Murmur heard.     Systolic murmur is present with a grade of 1/6.     No friction rub. No gallop.  Pulmonary:     Effort: Pulmonary effort is normal. No respiratory distress.     Breath sounds: Normal breath sounds. No wheezing or rales.  Skin:    General: Skin is warm and dry.  Neurological:     Mental Status: She is alert and oriented to person, place, and time. Mental status is at baseline.   Psychiatric:        Mood and Affect: Mood normal.        Behavior: Behavior normal.        Thought Content: Thought content normal.        Judgment: Judgment normal.       Results:   Studies obtained and personally reviewed by me:  04/20/22 carotid arterial duplex study showed right carotid velocities in the right and left ICA are consistent with a 1-39% stenosis, antegrade flow in bilateral vertebral arteries, normal flow in bilateral subclavian arteries.   Labs:  Component Value Date/Time   NA 137 12/29/2022 0000   K 4.6 11/11/2022 1106   CL 105 12/29/2022 0000   CO2 25 (A) 12/29/2022 0000   GLUCOSE 97 11/11/2022 1106   BUN 12 12/29/2022 0000   CREATININE 0.8 12/29/2022 0000   CREATININE 0.73 11/11/2022 1106   CALCIUM 8.7 12/29/2022 0000   PROT 6.9 11/11/2022 1106   PROT 6.2 04/20/2017 1151   ALBUMIN 3.9 09/02/2022 1221   ALBUMIN 3.9 04/20/2017 1151   AST 44 (H) 11/11/2022 1106   ALT 86 (H) 11/11/2022 1106   ALKPHOS 64 09/02/2022 1221   BILITOT 0.2 11/11/2022 1106   BILITOT <0.2 04/20/2017 1151   GFRNONAA >60 07/08/2022 0429   GFRNONAA 50 (L) 07/10/2020 1021   GFRAA 58 (L) 07/10/2020 1021     Lab Results  Component Value Date   WBC 6.3 12/29/2022   HGB 10.3 (A) 12/29/2022   HCT 32 (A) 12/29/2022   MCV 93.6 11/11/2022   PLT 231 12/29/2022    Lab Results  Component Value Date   CHOL 211 (H) 03/05/2022   HDL 118 03/05/2022   LDLCALC 79 03/05/2022   LDLDIRECT 98.2 04/10/2007   TRIG 49 03/05/2022   CHOLHDL 1.8 03/05/2022    Lab Results  Component Value Date   HGBA1C 5.1 02/15/2011     Lab Results  Component Value Date   TSH 1.58 07/20/2022      Assessment & Plan:   Depression, bipolar disorder: receiving electroconvulsive therapy weekly at [facility]. Followed by psychiatrist, Dr. Meredith Staggers. Discussed situational stress regarding her brother, Doreatha Martin. Taking memantine 10 mg twice daily, paroxetine 10 mg at bedtime, clonazepam 1 mg twice  daily.  Chronic constipation: controlled with linaclotide 144 mcg daily before breakfast.  Refilled losartan, amlodipine, thiamine. Ordered BMET.  PT order placed.  B-met is stable pre-albumin is low normal at 18. Hopefully appetite will improve soon. Prealbumin is a measure  of protein stores. She has an excellent care-giver and hopefully remain in her home with close follow up at [facility].  I,Alexander Ruley,acting as a Neurosurgeon for Margaree Mackintosh, MD.,have documented all relevant documentation on the behalf of Margaree Mackintosh, MD,as directed by  Margaree Mackintosh, MD while in the presence of Margaree Mackintosh, MD.   I, Margaree Mackintosh, MD, have reviewed all documentation for this visit. The documentation on 01/30/23 for the exam, diagnosis, procedures, and orders are all accurate and complete.

## 2023-01-08 ENCOUNTER — Other Ambulatory Visit (HOSPITAL_COMMUNITY): Payer: Self-pay

## 2023-01-10 ENCOUNTER — Encounter: Payer: Self-pay | Admitting: Gastroenterology

## 2023-01-10 ENCOUNTER — Ambulatory Visit: Payer: Medicare Other | Admitting: Psychiatry

## 2023-01-10 ENCOUNTER — Other Ambulatory Visit (HOSPITAL_BASED_OUTPATIENT_CLINIC_OR_DEPARTMENT_OTHER): Payer: Self-pay

## 2023-01-11 ENCOUNTER — Inpatient Hospital Stay: Payer: Medicare Other | Admitting: Internal Medicine

## 2023-01-11 DIAGNOSIS — F314 Bipolar disorder, current episode depressed, severe, without psychotic features: Secondary | ICD-10-CM | POA: Diagnosis not present

## 2023-01-13 ENCOUNTER — Ambulatory Visit: Payer: Medicare Other | Admitting: Psychiatry

## 2023-01-14 ENCOUNTER — Ambulatory Visit (INDEPENDENT_AMBULATORY_CARE_PROVIDER_SITE_OTHER): Payer: Medicare Other | Admitting: Psychiatry

## 2023-01-14 ENCOUNTER — Ambulatory Visit (INDEPENDENT_AMBULATORY_CARE_PROVIDER_SITE_OTHER): Payer: Medicare Other | Admitting: Internal Medicine

## 2023-01-14 ENCOUNTER — Encounter: Payer: Self-pay | Admitting: Psychiatry

## 2023-01-14 ENCOUNTER — Encounter: Payer: Self-pay | Admitting: Internal Medicine

## 2023-01-14 ENCOUNTER — Other Ambulatory Visit (HOSPITAL_BASED_OUTPATIENT_CLINIC_OR_DEPARTMENT_OTHER): Payer: Self-pay

## 2023-01-14 ENCOUNTER — Other Ambulatory Visit: Payer: Self-pay

## 2023-01-14 VITALS — BP 112/50 | HR 68 | Temp 98.0°F | Ht <= 58 in | Wt 101.8 lb

## 2023-01-14 DIAGNOSIS — F314 Bipolar disorder, current episode depressed, severe, without psychotic features: Secondary | ICD-10-CM

## 2023-01-14 DIAGNOSIS — F061 Catatonic disorder due to known physiological condition: Secondary | ICD-10-CM | POA: Diagnosis not present

## 2023-01-14 DIAGNOSIS — E039 Hypothyroidism, unspecified: Secondary | ICD-10-CM | POA: Diagnosis not present

## 2023-01-14 DIAGNOSIS — Z9181 History of falling: Secondary | ICD-10-CM

## 2023-01-14 DIAGNOSIS — Z79899 Other long term (current) drug therapy: Secondary | ICD-10-CM

## 2023-01-14 DIAGNOSIS — R634 Abnormal weight loss: Secondary | ICD-10-CM | POA: Diagnosis not present

## 2023-01-14 DIAGNOSIS — F411 Generalized anxiety disorder: Secondary | ICD-10-CM | POA: Diagnosis not present

## 2023-01-14 DIAGNOSIS — I10 Essential (primary) hypertension: Secondary | ICD-10-CM | POA: Diagnosis not present

## 2023-01-14 DIAGNOSIS — G3184 Mild cognitive impairment, so stated: Secondary | ICD-10-CM | POA: Diagnosis not present

## 2023-01-14 DIAGNOSIS — F3132 Bipolar disorder, current episode depressed, moderate: Secondary | ICD-10-CM

## 2023-01-14 DIAGNOSIS — E7800 Pure hypercholesterolemia, unspecified: Secondary | ICD-10-CM | POA: Diagnosis not present

## 2023-01-14 DIAGNOSIS — Z5181 Encounter for therapeutic drug level monitoring: Secondary | ICD-10-CM | POA: Diagnosis not present

## 2023-01-14 DIAGNOSIS — R2681 Unsteadiness on feet: Secondary | ICD-10-CM

## 2023-01-14 MED ORDER — HYDROCHLOROTHIAZIDE 12.5 MG PO TABS
12.5000 mg | ORAL_TABLET | Freq: Every day | ORAL | 0 refills | Status: DC
  Filled 2023-01-14: qty 30, 30d supply, fill #0

## 2023-01-14 MED ORDER — AMLODIPINE BESYLATE 10 MG PO TABS
10.0000 mg | ORAL_TABLET | Freq: Every day | ORAL | 0 refills | Status: DC
  Filled 2023-01-14: qty 30, 30d supply, fill #0

## 2023-01-14 MED ORDER — VITAMIN B-1 100 MG PO TABS
100.0000 mg | ORAL_TABLET | Freq: Every day | ORAL | 0 refills | Status: DC
  Filled 2023-01-14: qty 100, 100d supply, fill #0

## 2023-01-14 MED ORDER — LOSARTAN POTASSIUM 100 MG PO TABS
100.0000 mg | ORAL_TABLET | Freq: Every day | ORAL | 0 refills | Status: DC
  Filled 2023-01-14: qty 30, 30d supply, fill #0

## 2023-01-14 NOTE — Progress Notes (Signed)
[name] [id number] 1941-04-20 82 y.o.  Subjective:   Patient ID:  Meghan Oliver is a 82 y.o. (DOB 19-Mar-1941) female. Patient was last seen August 21, 2018 Chief Complaint:  Chief Complaint  Patient presents with   Follow-up   Depression   Anxiety   Altered Mental Status   Weight Loss   Medication Reaction     [name] presents to the office today for follow-up of Severe TR bipolar depression with psychotic features including catatonia.  11/30/2018 was the last visit and the following was noted: Had ECT yesterday at [facility].  Last was Jan 8 and was doing well then.  Next scheduled April 15.  Had a lithium level from there which is pending. Pretty good until the last 10 days to 2 weeks with less energy and motivation.  Bothered by old sick dog.  Worried about how that will affect her.  Has slept with the dog.  [place]ed [place].  Going to gym and playing [place].  Took the class and didn't feel she caught on quickly. Wonders if it is bc of the ECT memory effects.  Bridge is harder and not playing.  Planning to return to [facility] mid April.   spent the winter in [place] per usual..  No significant depression since here. ECT frequency about 6 weeks.  Gets very anxious the day of the ECT.  ECT consistently for a year.  Best in mood in 7-8 years.  Also the pacemaker helped.  Friends notice the benefit.  Plan:  Option increase in the nortriptyline if needed.  Considered this.  They agree and would like to increase nortriptyline to 75 to try to reduce tendency to depression before the ECT.   03/19/19  Addendum: Here the contents from an email from the nurse for Ms. Miklas  Hello [name],   I hope you and your family are healthy and well. I took Nazli to [facility] for ECT and here are labs from 01/26/19. I am going to also forward them to [facility] for follow up with her LFT's. They are still higher than I would like.  Her lithium level was 0.51 on a daily dose of 300mg  qohs x 4 nights-  alternating with 450mg  qohs x 3 nights. She is not demonstrating any signs of elevated levels, minimal hand tremors, less anxiety and restlessness since her ECT treatment.  Her Nortryptyline level was 140 on 75mg  qhs.  I would like to continue her current dosages of both the above meds and recheck her lithium level at her next scheduled ECT of 03/23/19.  Please let me know of any changes you would like to make. Take care, [name] Lithium level was 0.4 on the 450mg  alt with 300 mg QOD. No changes were made in meds.  11/15/2019 phone call: Telephone call from patient's husband [name] on 11/14/2019  Patient is scheduled for ECT soon for maintenance ECT.  He thinks it is been 2-1/2 months since the last ECT but he is going to check the date for sure.  It is still be done being done at [facility].  He is wondering about skipping this treatment because the patient has continued to be free of depression and seems cognitively clearer as these ECT treatments have been spread out further from each other.  Patient remains on medications as prescribed.  If it is truly been over 2 months since the last ECT then it is reasonable to consider discontinuing ECT.  It  is unlikely that ECT with the frequency of greater than 2 months is significantly helpful at preventing her relapse.  If however the ECT frequency is less than every 2 months it could still be helping to prevent recurrence.  He indicated he would consider this information and discuss it with her ECT team and make a decision.  They are heading to [place] in the next few weeks.  Needs to schedule an appointment with me for follow-up because it is been many months.  He agrees.   02/19/2020 appt, the following noted: Moving to [facility] in a few months.  Ready to downsize.   Stopped ECT as discussed and has not been more deprressed.   Walks dog 4 times daily.   Lithium 0.7 on  01/30/20 on lithium 300 mg plus 150 mg on M, W, F Nortriptyline 70 on 75  mg HS. No SE except tremor.  Balance is not great.  Very happy.   No depression since here.  No mood swings.  Sleep and appetite is OK.  No med changes:  04/03/2020 TC with the following noted: Patient's husband called stating that her tremor was a little worse and she was having some stutter. No evidence of stroke otherwise. First thing would be to check serum lithium level and BMP.  Order written.  Have asked her husband to let us know about the lab to which it should be sent.  04/22/20 TC witht he following noted: Lithium level is not dangerously high but it is has crept up to 1.0 which is higher than desired for her.  Our goal for her is 0.5-0.7.  At the current level it is likely causing side effects so we should reduce the dosage from lithium 300 mg plus 150 mg on M, W, F  TO 300 mg daily.  Meaning drop off the 150 mg capsule.  It will take about 2 weeks for the level to gradually come down to the desired level.  05/08/2020 appt with the following noted: Seen with H and [name] nurse. Moving to [facility] and feels OK about it.  I think I'll be fine.   Pretty good with balance.  Doing yoga and it helps. [name] has cancer. Old dog 37 & 1/2 yo still living. [name]'s concerns when went to [place] for D's BD she had difficulty time with tremor lethargy, more confusion.  Better when go back home. [name] notes using more lorazepam. At times gait is unsteady. Not significantly depressed.  Can be anxious at times.  Eating okay.  Sleeping okay.  Is easily confused under stress. Plan: reduce lithium to 300 mg nightly.  07/14/20 appt with the following noted: Seen with her husband as well as her family nurse. Feeling fine and pleased with that.   Some anxiety over the move to smaller place. [name]'s twin also having heart problems Gene. [name]'s kids came and visited.    Plan: Continue nortriptyline 75 mg, paroxetine 20 mg, lithium 300 mg, Namenda 10 mg twice daily, lorazepam 0.5 mg 1/2-1 3 times daily as  needed, mirtazapine 7.5 mg nightly  02/27/2021 appointment with the following noted:  Seen with H and nurse [name] Received email from nurse [name] on 02/24/2021 indicated that he had moved into [facility] living in May.  Rylyn has gradually become more depressed and more anxious using lorazepam as prescribed 3 times daily.  More memory issues.  She has remained engaged and initiating appropriate conversation.  She is very worried about her 57 year old  dog who is in poor health and fears she will become more depressed when the dog dies.  She and her husband do not wish to prefer to pursue further ECT unless absolutely necessary. Likes [facility] so far.   No depression by her report.  Sold big house in Feb.  It worked out well.   Worries about her dog 48 yo will die soon. H agrees she's done well with depression.   Enjoys things and has interests. Plan: Continue Lithium 300 mg daily  Continue nortriptyline 75 mg daily Continue paroxetine 20 mg daily Continue lorazepam 0.5 mg 3 times daily for both anxiety and catatonia Continue vitamin B6 and primidone for tremor Cerefolin NAC 2 daily.  05/13/2021 appointment with the following noted: Happy with [facility].  H and she agree is doing well with depression.[place] socialization. She complains of memory.   Family visited and getting along with Dondra Spry better.  H says Dondra Spry is acting better.  Goes to dinner and activities together now.  Better relationship with Gene.. Tremor has been OK.   Ativan usually 0.25 mg TID and tolerated. Anxiety managed with this generally. Molly dog health more stable. To [place] before T'giving until April.   Plan: Continue Lithium 300 mg daily  Continue nortriptyline 75 mg daily Continue paroxetine 20 mg daily Continue lorazepam 0.5 mg 3 times daily for both anxiety and catatonia Continue vitamin B6 and primidone for tremor   08/14/2021 appointment with the following noted: Seen with her husband [name] and nurse  [name]. Everyone reports that her mood has been stable.  Her anxiety is manageable with as needed lorazepam.  She is still happy with the transition to [facility].  They are preparing to go to [place] for the winter per usual but may sell their house father there. Tolerating meds without any unusual side effects. Is dealing with grief over the loss of her dog Molly.  03/10/2022 appt noted:  seen with H and nurse Oleta Mouse [name] house. [name] not good phsyically hard to play golf.  Been really hard. He'll be 85 in July.  Some stress scheduling with D's.   60 th wedding necessary this month.   Mood up and down.  Sister in law across the street can be difficult and demeaning.  She feels unsettled.  Some worry. Some back pain limiting activity.   Sleep is ok except with pain awakening.   No SE, except tremor some worse about 3-5 and helped by lorazepam 0.25 mg then No greater than 1 mg lorazepam daily Patient denies any recent difficulty with anxiety except as noted.  Patient denies difficulty with sleep initiation or maintenance. Denies appetite disturbance.    Patient has some difficulty with concentration.  Patient denies any suicidal ideation.  Usually takes Ativan at night and sleeps well 8 hours.   Plan no med changes  10/23/213 appt noted: Still doing well. Pneumonia and hosp for 6 days 3 weeks ago.  Viral.  Improving at this time. Increased tremor gradually in 3-4 mos helped by lorazepam avg 1.00-1.25 mg daily. No change in primidone. Total R hip August 2023 and done well.  Finishing home PT.   Appetite poor for 7-8 mos.  2 protein drinks daily but may not eat more than 1 good meal a day. [facility]. Substantial memory problems in hospital and in unusual enviornments. Nurse notices decline.  B with LBD passed in August. No pain meds or muscle relaxants beyond pain. No depression.  H note sometimes she has  less motivation than others. [name] notes some normal waxing and waning of mood. Good  social interaction.   Not spending winter in [place] [facility] this year.  D still lives there. Plan: Increase mirtazapine 15 mg HS for appetite.  Also used for sleep and depression.  09/23/22 appt noted:  with nurse and [name] [name] feeling bad with more pain and fatigue.  [name] has CHF.   Made [place] sad.  Lost a cgood friends.  Rough time.  Mostly worry about [name].  She feels afraid to be alone if something happens.   Not sig depressed.    Just worry . H can't be as active.  Taking a shower is hard.   She's back exercising again.   Went to [employer] for Thanksgiving in [facility] and plan to go back for Christmas. It's hard to travel. Told nurse she was depressed. Appetite is better and once asleep is OK. Using more lorazepam for anxiety 3-4 daily. Plan: Continue Lithium 300 mg daily  Continue nortriptyline 75 mg daily Continue paroxetine 20 mg daily Continue lorazepam 0.5 mg 3 times daily for both anxiety and catatonia Continue vitamin B6 and primidone for tremor Increased and it helped mirtazapine 15 mg HS for appetite.  Also used for sleep and depression.  10/19/22  RTC  [name] reports not doing well.  Went to [facility].  Recent pneumonia.  They are thinking this is aspiration pneurmonia. Lethargic and anxious with tremor. Ruminating.  Hot to cold .  Low tolerance.  Going downhill quickly.  She's verbalizing depression and desire to isolate.  Won't go out.  Started within the last 2 weeks. Usually doesn't complain when she's sick.   [name]'s biggest concern is that Doreatha Martin is having trouble handling this bc she's up at night rambling and not really safe to be up at night.    Will see her 10/22/22 Get lithium and nortriptyline levels.   Meredith Staggers, MD, [facility]     10/22/22 emergency appt with nurse [name] and H [name]: Pneumonia Oct and then again 2nd week January.  It appears to have largely resoved.  It's really bad.  Nausea. Anxious bc [name] ill.  Doesn't want him to leave.  [name] sees anxiety triggering anxiety with little to no  appetite.  Hypersensitive to stimuli like light and noises.  Doesn't want TV on.  [name] says totally reversed in last 6 weeks.    [name] reports she's overemotional.  Friends died. Losing wt 91.4# today. More trouble with sleep. Lorazepam increased to 2 mg daily.  Not sleepy with it.  Doesn't seem to be helpful. Last week neglect hygiene but better this week a little. [name] says STM is poor at times and at times disoriented if in [place].  Will walk in bathroom and wonder where sh is. Barium swallow pending for evaluation of refulex.    10/26/22 RTC:  RTC   No improvement since Friday.  Gradually less engaged.  Can be overstimulated with visitors.   Disc nortriptyline level 115 is a little higher than it was and higher levels can cause SE in elderly.   Reduce nortriptyline to 50 mg HS. Taking lorazepam    Consider low dose of Vraylar or pramipexole.  Or Spravato.     Meredith Staggers, MD, [facility]     11/15/22 appt noted: Dialogue with Dr. Shirlee Latch cardiologist agreed pt should be stable enough from CV perspective to receive Spravato which could elevate BP.  If that occurs use prn metoprolol 25 mg. Disc in detail with  Dr. Corinda Gubler and he agrees [place] Sink is appropriate though not FDA approved for bipolar depression.  Disc 2 metanalysis in support of this and this alernative is safer and likely better tolerated than the option of ECT and faster than options of trials of Trintellix, Auvelity, Latuda etc. Tolerating meds.  She is depressed and very anxious and intermittently confused.  Taking clothes on and off bc rapid changes in comfort.  Worried over [employer].  Sleep disrupted appetite some better with mirtazapine. Received Spravato 56 mg first time today and was dissociated.  Resolved over 2 hours.  No HA, NV.  Was distressed moderately and very ambivalent about Tx.  H and nurse supportive of tx plan and for the pt.    11/24/22 appt noted: Current meds: nortriptyline 50 mg HS, paroxetine 20 mg daily, lithium  300 mg nightly, mirtazapine 30 mg nightly, memantine 10 mg twice daily.  Lorazepam 0.5 mg every 4 to 6 hours as needed catatonia or severe anxiety. Received first dose of Spravato 84 mg today.  She was more noticeably dissociated than with the 56 mg dose.  It did resolve over the 2-hour course of observation although she remained somewhat unsteady and needed to leave via wheelchair.  She did not have headache nausea or vomiting.  She remains ambivalent about the treatment. Husband notes she has continued to be anxious and depressed and was extremely anxious yesterday and somewhat confused.  At times would say things that did not make sense.  He remained supportive of the treatment while she remains ambivalent about the treatment.  Her appetite is good and she is eating well.  Sleep is variable.  H reported at times will be almost normal after receiving an extra dose of lorazepam 0.5 mg prn but this only lasts an hour or so. Plan: Continue Lithium 300 mg daily  Continue nortriptyline 50 mg daily Continue paroxetine 20 mg daily Continue vitamin B6 and primidone for tremor Imirtazapine 30 mg HS for appetite,  used for sleep and depression.  Appetite better Switch lorazepam to [facility] ER 2 mg AM and use lorazepam 0.5 mg prn.  For catatonia in hopes of more consistent relief.  Decision based on observation of periods of almost normalcy after some BZ doses.  We discussed potential switch of paroxetine and nortriptyline to Auvelity or Trintellix or possibly Jordan.  However these switches will be difficult and it is hoped that the Spravato may provide enough improvement to allow a switch in medicine more tolerable.  11/26/22 appt noted: Current meds: nortriptyline 50 mg HS, paroxetine 20 mg daily, lithium 300 mg nightly, mirtazapine 30 mg nightly, memantine 10 mg twice daily.  Lorazepam 0.5 mg every 4 to 6 hours as needed catatonia or severe anxiety.  Started Loreev 2 mg daily over weekend Received Spravato 84  mg today.   Spravato with expected dissociation.  She didn't experience relief from negative emotion.  Dissociation resolved over observation period.  No N, V, HA.   Still high anxiety and dread of most everything.  Depressed with negative thought predominates.  H notes periods of confusion and then maybe an hour of clarity sometimes after the dosing of lorazepam.  Then back to severe sx.  Eating is fair. No SI.  Fearful generally.  Needs help with ADLs.  Cannot take meds on her own. Tolerating meds without SE No noticeable effect from Loreev in terms of benefit or SE  12/01/22 appt noted: Received a phone call from nurse [name] this morning to  call as soon as possible this patient very agitated and confused.  Thrashing around in bed.  At times talking about dying out of the fear that she was going to die.  Highly anxious.  Not very cooperative with drinking water or taking medicines this morning. Discussed the situation with her husband as well and we have to options: 1's if we can get her to the office to receive Spravato is scheduled today that may provide some immediate relief and forestall and may be prevent hospitalization.  The second option is to proceed directly to hospitalization.  If she is unable to take nutrition and hydration hospitalization will be necessary. Discussed that appeared to be catatonic hyperactivity and agitation which has not responded to the extended release lorazepam 2 mg a day.  Therefore she was given 1 mg of Ativan this morning and did calm down enough to make it to the Spravato administration this afternoon.  She was able to cooperate with Spravato administration and tolerated it well with typical levels of dissociation which gradually resolved over the 2 hours.  She was able to eat and drink fluids prior to coming to the appointment. Current meds: nortriptyline 50 mg HS, paroxetine 20 mg daily, lithium 300 mg nightly,  memantine 10 mg twice daily.  Lorazepam 0.5 mg every 4  to 6 hours as needed catatonia or severe anxiety.  Started Loreev 2 mg daily over weekend olanazpine 2.5 mg HS Received Spravato 84 mg today.   Husband notes that after Spravato administration on Monday she was much improved for a period of several hours and was able to have conversations with her family members and make decisions about distributing some of her jewelry and other family decisions.  However over Monday evening into Tuesday she became more agitated and confused and anxious again.  There have been no obvious effects of taking the olanzapine but it does not appear that long-acting lorazepam 2 mg as provided any significant relief. Plan: Continue Lithium 300 mg daily  Continue nortriptyline 50 mg daily Continue paroxetine 20 mg daily Continue vitamin B6 and primidone for tremor DC mirtazapine For catatonia: lorazepam0.5 mg q 4 hour mg daily as tolerated for catatonia with dose spread through the day..  Nurse manages. Restart Loreeve 2 mg daily and use prn lorazepam for catatonia Increase olanzapine 5 mg HS for TRD, anxiety, catatonia, delusions of persecution  12/06/22 appt noted; Psych meds: as above except increase Loreev to 2 mg BID for severe catatonia. nortriptyline 50 mg HS, paroxetine  20 mg daily, lithium 300 mg nightly,  memantine 10 mg twice daily.  Lorazepam 0.5 mg every 4 to 6 hours as needed catatonia or severe anxiety.  Started Loreev 2 mg increased BID for severe catatonia,  increased olanazpine 5 mg HS for 3 days. Received 5th dose of Spravato 84 mg today.    She was able to cooperate with Spravato administration and tolerated it well with typical levels of dissociation which gradually resolved over the 2 hours.  She was able to eat and drink fluids prior to coming to the appointment.   She still feels confused and depressed and anxious. Per family had periods over the weekend where she was confused thinking her H was dead even though he was right in front of her.  Also  thinking she is going to die and not get better.  Is eating and drinking fluids well.  Has needed extra lorazepam to sleep.  Limited periods of lucidity per H. Tolerating high  dose lorazepam but limited benefit for catatonia. Plan: Continue Lithium 300 mg daily  reduce nortriptyline 25 mg daily in anticipation of change Reduce paroxetine 10 mg daily in anticipation of change Continue vitamin B6 and primidone for tremor For catatonia: lorazepam0.5 mg q 4 hour mg daily as tolerated for catatonia with dose spread through the day..  Nurse manages. DT severity of catatonic hyperactivy Loreeve 2 mg BID and use prn lorazepam for catatonia Continue olanzapine 5 mg HS for TRD, anxiety, catatonia, delusions of persecution  12/08/22 appt noted: Psych meds:  Loreev to 2 mg BID for severe catatonia. nortriptyline 25 mg HS, paroxetine 10 mg daily, lithium 300 mg nightly,  memantine 10 mg twice daily.  Lorazepam 0.5 mg every 4 to 6 hours as needed catatonia or severe anxiety.  Started Loreev 2 mg increased BID for severe catatonia,  increased olanazpine 5 mg HS for 3 days. Received 6th dose of Spravato 84 mg today.    She was able to cooperate with Spravato administration and tolerated it well with typical levels of dissociation which gradually resolved over the 2 hours.  She was able to eat and drink fluids prior to coming to the appointment.   She still feels confused and depressed and anxious. Hopeless.  Thoughts she is going to die. Per nurse and H no sig improvement noted with spravato.   No current alcohol problems  01/14/2023 appointment on an urgent basis: Hospitalized due to psychiatry at [facility] from 12/10/2022 until 01/03/2023.  Hospitalized for bipolar disorder severe depression with catatonia.  Received ECT and Latuda 60 mg daily was started.  Stopped paroxetine, lithium, nortriptyline, and mirtazapine.  Lorazepam was switched to clonazepam. Current psych meds clonazepam 0.5 mg twice daily, Latuda  60 mg daily.  Memantine 10 mg twice daily, weekly ECT rec Wt 101# and better appetite.  Sleep not great but ok.  Will lie awake some.  H says it's pretty good. Alert in the day.  No SE with meds.  Tremor better.   Lorazepam only on ECT days.     ECT-MADRS    Flowsheet Row Clinical Support from 12/08/2022 in [facility]  MADRS Total Score 54      PHQ2-9    Flowsheet Row Office Visit from 01/14/2023 in [facility], MD Office Visit from 10/11/2022 in [facility], MD Office Visit from 08/17/2022 in [facility], MD Office Visit from 07/19/2022 in [facility], MD Office Visit from 03/24/2022 in [facility], MD  PHQ-2 Total Score 4 6 0 0 1  PHQ-9 Total Score 14 -- -- -- --      Flowsheet Row ED to Hosp-Admission (Discharged) from 07/01/2022 in [facility] Admission (Discharged) from 05/18/2022 in [facility] Pre-Admission Testing 60 from 05/10/2022 in [facility]-PRE[facility] TEST[facility]  C-SSRS RISK CATEGORY No Risk No Risk No Risk        Past Psychiatric Medication Trials:  Prozac 40 with lithium and Zyprexa 2.5 to 5 mg,  nortriptyline, paroxetine, Effexor 225 Wellbutrin 300 mirtazapine,  Others  Pramipexole 0.625 daily hallucinations  Rexulti EPS, Abilify 15, Vraylar 1.5 1 week,  Latuda 10 mg SE, Olanzapine 10 briefly Caplyta briefly no response   Lamotrigine level 7.5  Namenda,  lorazepam,  Clonazaam 0.5 BID  no response Spravato after 6 tx of 84 mg and 2 of 56 mg .  this list is not exhaustive  Review of Systems:  Review  of Systems  Constitutional:  Positive for fatigue. Negative for appetite change.  Cardiovascular:  Negative for palpitations.  Gastrointestinal:  Positive for constipation. Negative for nausea.       Linzess and Mozantic managed   Musculoskeletal:  Positive for gait problem.  Neurological:  Positive for dizziness and weakness. Negative for  tremors.       Some balance issues  Psychiatric/Behavioral:  Positive for decreased concentration. Negative for agitation, behavioral problems, confusion, dysphoric mood, hallucinations, self-injury, sleep disturbance and suicidal ideas. The patient is nervous/anxious. The patient is not hyperactive.     Medications: I have reviewed the patient's current medications.  Current Outpatient Medications  Medication Sig Dispense Refill   amoxicillin (AMOXIL) 500 MG capsule Take 3 capsules (1,500 mg total) by mouth 1 hour prior to dental work. 9 capsule 1   aspirin 81 MG chewable tablet Chew 1 tablet (81 mg total) by mouth daily.     Calcium Carbonate-Vitamin D (CALCIUM 600/VITAMIN D PO) Take 1 tablet by mouth 2 (two) times daily.     cetirizine (ZYRTEC) 10 MG tablet Take 10 mg by mouth as needed for allergies.     Cholecalciferol (VITAMIN D3) 50 MCG (2000 UT) TABS Take 2,000 Units by mouth in the morning.     clonazePAM (KLONOPIN) 0.5 MG tablet Take 2 tablets (1 mg total) by mouth 2 (two) times daily. 120 tablet 0   COVID-19 mRNA vaccine 2023-2024 (COMIRNATY) syringe Inject into the muscle. 0.3 mL 0   dexlansoprazole (DEXILANT) 60 MG capsule Take 1 capsule by mouth daily 90 capsule 3   fesoterodine (TOVIAZ) 8 MG TB24 tablet Take 1 tablet (8 mg total) by mouth daily. 90 tablet 3   glycerin adult 2 g suppository Use 1 suppository as needed if you have not had a bowel movement in  4 to 5 days 25 suppository 0   influenza vaccine adjuvanted (FLUAD QUADRIVALENT) 0.5 ML injection Inject into the muscle. 0.5 mL 0   Krill Oil 500 MG CAPS Take 500 mg by mouth daily.     lactulose (CHRONULAC) 10 GM/15ML solution Take 30 mLs (20 g total) by mouth 3 (three) times daily between meals as needed for mild constipation. (Patient taking differently: Take 20 g by mouth daily as needed for mild constipation.) 473 mL 3   Lavender Oil 80 MG CAPS Take 160 mg by mouth at bedtime.     levothyroxine (SYNTHROID) 75 MCG tablet  Take 1 tablet (75 mcg total) by mouth daily. 90 tablet 2   levothyroxine (SYNTHROID) 75 MCG tablet Take 1 tablet (75 mcg total) by mouth daily. 90 tablet 2   levothyroxine (SYNTHROID) 75 MCG tablet Take 1 tablet (75 mcg total) by mouth daily. 90 tablet 2   LORazepam (ATIVAN) 0.5 MG tablet TAKE 2 TABLETS (1 MG)  BY MOUTH EVERY NIGHT AT BEDTIME AND 1 TABLET EVERY 4 HOURS AS NEEDED FOR ANXIETY (Patient taking differently: Take 0.5 mg by mouth as needed. TAKE 2 TABLETS (1 MG)  BY MOUTH EVERY NIGHT AT BEDTIME AND 1 TABLET EVERY 4 HOURS AS NEEDED FOR ANXIETY) 270 tablet 0   Lurasidone HCl 60 MG TABS Take 1 tablet (60 mg total) by mouth daily after supper. 90 tablet 0   magic mouthwash w/lidocaine SOLN Take 5 mLs by mouth 3 (three) times daily as needed for mouth pain. (Patient taking differently: Take 5 mLs by mouth as needed for mouth pain.) 120 mL 0   memantine (NAMENDA) 10 MG tablet Take 1 tablet (  10 mg total) by mouth 2 (two) times daily. 180 tablet 1   metoprolol tartrate (LOPRESSOR) 25 MG tablet Take 1 tablet (25 mg total) by mouth as needed. 45 tablet 3   Multiple Vitamins-Minerals (MULTIVITAMIN WITH MINERALS) tablet Take 1 tablet by mouth daily.     Multiple Vitamins-Minerals (PRESERVISION AREDS 2 PO) Take 1 capsule by mouth in the morning and at bedtime.     naloxegol oxalate (MOVANTIK) 12.5 MG TABS tablet Take 2 tablets (25 mg total) by mouth daily. (Patient taking differently: Take 25 mg by mouth daily. PRN) 60 tablet 3   ondansetron (ZOFRAN-ODT) 4 MG disintegrating tablet Place 1 tablet every 6 hours by translingual route as needed. (Patient taking differently: Take by mouth as needed.) 20 tablet 0   pneumococcal 20-valent conjugate vaccine (PREVNAR 20) 0.5 ML injection Inject into the muscle. 0.5 mL 0   polyethylene glycol (MIRALAX / GLYCOLAX) 17 g packet Take 17 g by mouth as needed for mild constipation.     primidone (MYSOLINE) 50 MG tablet Take 2 tablets (100 mg total) by mouth every morning  and 1 tablet every evening. 270 tablet 0   Pyridoxine HCl (VITAMIN B-6) 500 MG tablet Take 500 mg by mouth 2 (two) times daily.     RSV vaccine recomb adjuvanted (AREXVY) 120 MCG/0.5ML injection Inject into the muscle. 0.5 mL 0   sennosides-docusate sodium (SENOKOT-S) 8.6-50 MG tablet Take 2 tablets by mouth as needed.     valACYclovir (VALTREX) 1000 MG tablet Take 2 tablets by mouth at onset, then 2 tablets 12 hours later. (Patient taking differently: Take 2,000 mg by mouth as needed. PRn) 30 tablet 1   vitamin B-12 (CYANOCOBALAMIN) 1000 MCG tablet Take 1,000 mcg by mouth at bedtime.     amLODipine (NORVASC) 10 MG tablet Take 1 tablet (10 mg total) by mouth daily. 90 tablet 1   amoxicillin (AMOXIL) 500 MG capsule Take 1 capsule (500 mg total) by mouth 4 (four) times daily for 7 days 28 capsule 0   amoxicillin-clavulanate (AUGMENTIN) 875-125 MG tablet Take 1 tablet by mouth 2 (two) times daily. 14 tablet 0   hydrochlorothiazide (HYDRODIURIL) 12.5 MG tablet Take 1 tablet (12.5 mg total) by mouth daily. 90 tablet 1   linaclotide (LINZESS) 72 MCG capsule Take 2 capsules (144 mcg total) by mouth daily before breakfast. 180 capsule 1   losartan (COZAAR) 100 MG tablet Take 1 tablet (100 mg total) by mouth daily. 90 tablet 1   thiamine (VITAMIN B-1) 100 MG tablet Take 1 tablet (100 mg total) by mouth daily. 100 tablet 0   No current facility-administered medications for this visit.    Medication Side Effects: Other: tremor  stable.  No worse.  Allergies:  Allergies  Allergen Reactions   Propranolol Other (See Comments)    Low blood pressure. Bradycardia    Brexpiprazole Other (See Comments)    Aphasia and catatonia    Past Medical History:  Diagnosis Date   Anxiety    Arthritis    "hips, spine" (03/17/2018)   Bipolar II disorder (HCC)    CHF (congestive heart failure) (HCC)    Chronic bronchitis (HCC)    Chronic lower back pain    Chronic right hip pain    CKD (chronic kidney disease),  stage II    Coronary artery disease    stent x1   Esophagitis, erosive    GAD (generalized anxiety disorder)    GERD (gastroesophageal reflux disease)    Headache    "  maybe monthly" (03/17/2018))   Heart murmur, systolic    History of adenomatous polyp of colon    08-04-2016  tubular adenoma   History of blood transfusion 12/2017   "related to vascular hematoma"   History of electroconvulsive therapy    at [facility]--  started 04-15-2015 to 11-17-2016  total greater than 40 times   History of hiatal hernia    Hyperlipidemia    Hypertension    Hypothyroidism    Internal carotid artery stenosis, bilateral    per last duplex 05-01-2014  bilateral ICA 40-59%   Major depression, chronic    ECT treatments extensive and multiple started 07/ 2016   Memory loss    "both short and long-term; needs frequent reminders to follow instrucitons" (05/16/2017)   Migraines    "none in years" (03/17/2018)   OSA (obstructive sleep apnea)    per study 06/ 2012 moderate OSA  ; "refuses to wear masks" (03/17/2018)   Osteoporosis    Pneumonia 07/29/2022   Poor historian    due to short term memory loss   Presence of permanent cardiac pacemaker 03/17/2018   Pulmonary nodule    monitored by pcp   S/P placement of cardiac pacemaker 03/17/18 [facility]  03/18/2018   Short-term memory loss    Sick sinus syndrome (HCC)     Family History  Problem Relation Age of Onset   Heart attack Father 49       deceased   Hypertension Father    Heart disease Father    Dementia Brother    Parkinson's disease Brother    Heart disease Brother    Breast cancer Paternal Aunt        Age 85's   Breast cancer Paternal Grandmother        Age unknown   Colon cancer Neg Hx     Social History   Socioeconomic History   Marital status: Married    Spouse name: Dr. Victorino Dike   Number of children: 2   Years of education: Not on file   Highest education level: Not on file  Occupational History   Occupation: housewife     Employer: UNEMPLOYED  Tobacco Use   Smoking status: Former    Packs/day: 2.00    Years: 15.00    Additional pack years: 0.00    Total pack years: 30.00    Types: Cigarettes    Quit date: 01/13/1971    Years since quitting: 52.0    Passive exposure: Never   Smokeless tobacco: Never  Vaping Use   Vaping Use: Never used  Substance and Sexual Activity   Alcohol use: Yes    Comment: occassional 1 x a week   Drug use: Never   Sexual activity: Not Currently    Comment: intercourse age 54, sexual partners less than 5  Other Topics Concern   Not on file  Social History Narrative   Right handed   Social Determinants of Health   Financial Resource Strain: Not on file  Food Insecurity: No Food Insecurity (07/06/2022)   Hunger Vital Sign    Worried About Running Out of Food in the Last Year: Never true    Ran Out of Food in the Last Year: Never true  Transportation Needs: No Transportation Needs (07/06/2022)   PRAPARE - Administrator, Civil Service (Medical): No    Lack of Transportation (Non-Medical): No  Physical Activity: Not on file  Stress: Not on file  Social Connections: Not on file  Intimate Partner Violence: Not At Risk (07/06/2022)   Humiliation, Afraid, Rape, and Kick questionnaire    Fear of Current or Ex-Partner: No    Emotionally Abused: No    Physically Abused: No    Sexually Abused: No    Past Medical History, Surgical history, Social history, and Family history were reviewed and updated as appropriate.   Please see review of systems for further details on the patient's review from today.   Objective:   Physical Exam:  LMP  (LMP Unknown)   Physical Exam Constitutional:      General: She is not in acute distress.    Appearance: She is well-developed.  Musculoskeletal:        General: No deformity.  Neurological:     Mental Status: She is alert and oriented to person, place, and time.     Motor: No tremor.     Coordination: Coordination  abnormal.     Comments: Cane use  Psychiatric:        Attention and Perception: She is attentive.        Mood and Affect: Mood is anxious and depressed. Affect is not labile, blunt or inappropriate.        Speech: Speech is not slurred.        Behavior: Behavior normal. Behavior is not slowed, withdrawn or combative.        Thought Content: Thought content is not delusional. Thought content does not include homicidal or suicidal ideation. Thought content does not include suicidal plan.        Cognition and Memory: She exhibits impaired recent memory.        Judgment: Judgment normal.     Comments: Insight fair to good. No auditory or visual hallucinations.  Depression and anxiety are remarkably improved but not resolved. Delusional guilt not elicited Affect pleasant and smiling     Lab Review:     Component Value Date/Time   NA 138 01/14/2023 1242   NA 137 12/29/2022 0000   K 4.0 01/14/2023 1242   CL 103 01/14/2023 1242   CO2 23 01/14/2023 1242   GLUCOSE 87 01/14/2023 1242   BUN 19 01/14/2023 1242   BUN 12 12/29/2022 0000   CREATININE 0.68 01/14/2023 1242   CALCIUM 8.4 (L) 01/14/2023 1242   PROT 6.9 11/11/2022 1106   PROT 6.2 04/20/2017 1151   ALBUMIN 3.9 09/02/2022 1221   ALBUMIN 3.9 04/20/2017 1151   AST 44 (H) 11/11/2022 1106   ALT 86 (H) 11/11/2022 1106   ALKPHOS 64 09/02/2022 1221   BILITOT 0.2 11/11/2022 1106   BILITOT <0.2 04/20/2017 1151   GFRNONAA >60 07/08/2022 0429   GFRNONAA 50 (L) 07/10/2020 1021   GFRAA 58 (L) 07/10/2020 1021       Component Value Date/Time   WBC 6.3 12/29/2022 0000   WBC 6.6 11/11/2022 1106   RBC 3.88 11/11/2022 1106   HGB 10.3 (A) 12/29/2022 0000   HGB 10.2 (L) 04/20/2017 1151   HCT 32 (A) 12/29/2022 0000   HCT 29.6 (L) 04/20/2017 1151   PLT 231 12/29/2022 0000   PLT 385 (H) 04/20/2017 1151   MCV 93.6 11/11/2022 1106   MCV 89 04/20/2017 1151   MCH 31.2 11/11/2022 1106   MCHC 33.3 11/11/2022 1106   RDW 13.7 11/11/2022 1106    RDW 13.9 04/20/2017 1151   LYMPHSABS 1,439 11/11/2022 1106   LYMPHSABS 1.5 04/20/2017 1151   MONOABS 0.7 07/07/2022 0458   EOSABS 112 11/11/2022 1106   EOSABS  0.3 04/20/2017 1151   BASOSABS 53 11/11/2022 1106   BASOSABS 0.0 04/20/2017 1151    Lithium Lvl  Date Value Ref Range Status  10/21/2022 0.5 (L) 0.6 - 1.2 mmol/L Final  10/21/2022 lithium level 0.5 on 300 mg nightly is stable.,  Nortriptyline 115 on 75 mg HS.  08/02/22 nortriptyline 85, lihtium 0.5  Nortriptyline level 51 on 50 mg nightly with paroxetine 20 mg daily  07/10/2020 lithium level 0.6 at [facility] office and nortriptyline level 130 on the current dosages of lithium 300 mg nightly and nortriptyline 75 mg nightly  02/20/2021 labs lithium 0.6 on 300 mg nightly.  Nortriptyline level 49 which is lower than expected on 75 mg nightly along with paroxetine 20 mg daily.    No results found for: "PHENYTOIN", "PHENOBARB", "VALPROATE", "CBMZ"   .res Assessment: Plan:    Severe bipolar I disorder, current or most recent episode depressed, with catatonia (HCC)  Generalized anxiety disorder  Mild cognitive impairment    History of lithium toxicity repeatedly while on chlorthalidone.  50-minute visit today.  Hx TX resistant bipolar depression with catatonia.  Recent decompensation and rapidly getting worse with some cognive impairment and mild psychotic sx and altered mental status.Depression and anxiety much worse in the last several weeks.  Her case has been highly complex and treatment resistant at times.  She has remarkedly improved with the ECT #10+ change in psychiatric medicine to Latuda 60 mg daily with clonazepam 0.5 mg twice daily. She is not ruminative.  Affect is positive.  She is remarkably less depressed.  She is still anxious and somewhat insecure.  Short-term memory impairment is consistent with what would be expected with the ECT at this time.  options of trials of Trintellix, Auvelity, Latuda etc. BP  has been ok.    However no response Spravato after 6 tx of 84 mg and 2 of 56 mg .  Has had previous sessions after which she had some coherence but only last ed a few hours at most..  Hospitalized at [facility] on psychiatric unit from 12/10/2022 to 01/03/2023 and received ECT and started Latuda 60 mg daily.  Stopped paroxetine, nortriptyline, and lithium.  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  During the severe depression she has catatonia.  She had required longterm ECT.  A previous episode of depression approximately 10 years ago stayed in remission on Paxil and nortriptyline and lithium for a period of years until the lithium level was decreased due to tremor.  Now we are maintaining an adequate lithium level.  She has had several episodes of lithium toxicity with hospitalization.  This resolved once she stopped chlorthalidone.  From records it appears her last ECT was in September 2020.    Reviewed the old chart and consider very low-dose Vraylar or perhaps pramipexole but risk latter could worsen psychosis.  For catatonia: Continue clonazepam 0.5 mg twice daily also for anxiety Continue Latuda 60 mg daily Continue lorazepam 0.5 mg as needed which is currently being used rarely.  Recommend continue ECT  weekly as [facility] recommends  Meredith Staggers MD, DFAPA Please see After Visit Summary for patient specific instructions.  Future Appointments  Date Time Provider Department Center  02/11/2023 10:30 AM Cottle, Steva Ready., MD [facility] None  02/21/2023 11:00 AM Napoleon Form, MD [facility]  04/04/2023  3:20 PM [facility] DEVICE REMOTES [facility]  07/04/2023  3:20 PM [facility] DEVICE REMOTES [facility]  10/03/2023  7:00  AM [facility] DEVICE REMOTES [facility]      No orders of the defined types were placed in this encounter.      -------------------------------

## 2023-01-15 LAB — BASIC METABOLIC PANEL: Chloride: 103 mmol/L (ref 98–110)

## 2023-01-17 ENCOUNTER — Other Ambulatory Visit (HOSPITAL_BASED_OUTPATIENT_CLINIC_OR_DEPARTMENT_OTHER): Payer: Self-pay

## 2023-01-17 ENCOUNTER — Other Ambulatory Visit (HOSPITAL_COMMUNITY): Payer: Self-pay

## 2023-01-17 LAB — BASIC METABOLIC PANEL
Calcium: 8.4 mg/dL — ABNORMAL LOW (ref 8.6–10.4)
Sodium: 138 mmol/L (ref 135–146)

## 2023-01-17 LAB — TEST AUTHORIZATION

## 2023-01-18 ENCOUNTER — Ambulatory Visit: Payer: Medicare Other | Admitting: Gastroenterology

## 2023-01-18 DIAGNOSIS — G4700 Insomnia, unspecified: Secondary | ICD-10-CM | POA: Diagnosis not present

## 2023-01-18 DIAGNOSIS — F202 Catatonic schizophrenia: Secondary | ICD-10-CM | POA: Diagnosis not present

## 2023-01-18 DIAGNOSIS — F061 Catatonic disorder due to known physiological condition: Secondary | ICD-10-CM | POA: Diagnosis not present

## 2023-01-19 ENCOUNTER — Other Ambulatory Visit (HOSPITAL_BASED_OUTPATIENT_CLINIC_OR_DEPARTMENT_OTHER): Payer: Self-pay

## 2023-01-19 LAB — BASIC METABOLIC PANEL
BUN: 19 mg/dL (ref 7–25)
CO2: 23 mmol/L (ref 20–32)
Creat: 0.68 mg/dL (ref 0.60–0.95)
Glucose, Bld: 87 mg/dL (ref 65–99)
Potassium: 4 mmol/L (ref 3.5–5.3)

## 2023-01-19 LAB — TEST AUTHORIZATION

## 2023-01-19 LAB — PREALBUMIN: Prealbumin: 18 mg/dL (ref 17–34)

## 2023-01-19 MED ORDER — LOSARTAN POTASSIUM 100 MG PO TABS
100.0000 mg | ORAL_TABLET | Freq: Every day | ORAL | 1 refills | Status: DC
  Filled 2023-01-19 – 2023-02-01 (×2): qty 90, 90d supply, fill #0
  Filled 2023-04-05: qty 90, 90d supply, fill #1

## 2023-01-19 MED ORDER — HYDROCHLOROTHIAZIDE 12.5 MG PO TABS
12.5000 mg | ORAL_TABLET | Freq: Every day | ORAL | 1 refills | Status: DC
  Filled 2023-01-19 – 2023-02-01 (×2): qty 90, 90d supply, fill #0
  Filled 2023-04-20: qty 90, 90d supply, fill #1

## 2023-01-19 MED ORDER — VITAMIN B-1 100 MG PO TABS
100.0000 mg | ORAL_TABLET | Freq: Every day | ORAL | 0 refills | Status: DC
  Filled 2023-01-19 – 2023-03-22 (×2): qty 100, 100d supply, fill #0

## 2023-01-19 MED ORDER — AMLODIPINE BESYLATE 10 MG PO TABS
10.0000 mg | ORAL_TABLET | Freq: Every day | ORAL | 1 refills | Status: DC
  Filled 2023-01-19 – 2023-03-22 (×2): qty 90, 90d supply, fill #0
  Filled 2023-06-08: qty 90, 90d supply, fill #1

## 2023-01-19 NOTE — Addendum Note (Signed)
Addended by: Mary Sella D on: 01/19/2023 11:59 AM   Modules accepted: Orders

## 2023-01-25 ENCOUNTER — Other Ambulatory Visit (HOSPITAL_BASED_OUTPATIENT_CLINIC_OR_DEPARTMENT_OTHER): Payer: Self-pay

## 2023-01-25 DIAGNOSIS — F319 Bipolar disorder, unspecified: Secondary | ICD-10-CM | POA: Diagnosis not present

## 2023-01-25 DIAGNOSIS — F061 Catatonic disorder due to known physiological condition: Secondary | ICD-10-CM | POA: Diagnosis not present

## 2023-01-25 DIAGNOSIS — F3189 Other bipolar disorder: Secondary | ICD-10-CM | POA: Diagnosis not present

## 2023-01-26 ENCOUNTER — Other Ambulatory Visit (HOSPITAL_COMMUNITY): Payer: Self-pay

## 2023-01-26 ENCOUNTER — Other Ambulatory Visit (HOSPITAL_BASED_OUTPATIENT_CLINIC_OR_DEPARTMENT_OTHER): Payer: Self-pay

## 2023-01-26 MED ORDER — AMOXICILLIN 500 MG PO CAPS
500.0000 mg | ORAL_CAPSULE | Freq: Four times a day (QID) | ORAL | 0 refills | Status: DC
  Filled 2023-01-26 (×2): qty 28, 7d supply, fill #0

## 2023-01-27 ENCOUNTER — Ambulatory Visit: Payer: Medicare Other | Admitting: Psychiatry

## 2023-01-27 DIAGNOSIS — F3181 Bipolar II disorder: Secondary | ICD-10-CM | POA: Diagnosis not present

## 2023-01-28 DIAGNOSIS — F3189 Other bipolar disorder: Secondary | ICD-10-CM | POA: Diagnosis not present

## 2023-01-28 DIAGNOSIS — F061 Catatonic disorder due to known physiological condition: Secondary | ICD-10-CM | POA: Diagnosis not present

## 2023-01-28 DIAGNOSIS — F319 Bipolar disorder, unspecified: Secondary | ICD-10-CM | POA: Diagnosis not present

## 2023-01-29 ENCOUNTER — Other Ambulatory Visit (HOSPITAL_BASED_OUTPATIENT_CLINIC_OR_DEPARTMENT_OTHER): Payer: Self-pay

## 2023-01-31 ENCOUNTER — Other Ambulatory Visit (HOSPITAL_BASED_OUTPATIENT_CLINIC_OR_DEPARTMENT_OTHER): Payer: Self-pay

## 2023-01-31 ENCOUNTER — Other Ambulatory Visit: Payer: Self-pay

## 2023-01-31 MED ORDER — AMOXICILLIN-POT CLAVULANATE 875-125 MG PO TABS
1.0000 | ORAL_TABLET | Freq: Two times a day (BID) | ORAL | 0 refills | Status: DC
  Filled 2023-01-31: qty 14, 7d supply, fill #0

## 2023-02-01 ENCOUNTER — Other Ambulatory Visit: Payer: Self-pay | Admitting: Gastroenterology

## 2023-02-01 ENCOUNTER — Other Ambulatory Visit: Payer: Self-pay

## 2023-02-01 ENCOUNTER — Other Ambulatory Visit (HOSPITAL_BASED_OUTPATIENT_CLINIC_OR_DEPARTMENT_OTHER): Payer: Self-pay

## 2023-02-01 DIAGNOSIS — F3130 Bipolar disorder, current episode depressed, mild or moderate severity, unspecified: Secondary | ICD-10-CM | POA: Diagnosis not present

## 2023-02-01 DIAGNOSIS — F319 Bipolar disorder, unspecified: Secondary | ICD-10-CM | POA: Diagnosis not present

## 2023-02-01 MED ORDER — LINACLOTIDE 72 MCG PO CAPS
144.0000 ug | ORAL_CAPSULE | Freq: Every day | ORAL | 1 refills | Status: DC
  Filled 2023-02-01: qty 180, 90d supply, fill #0

## 2023-02-02 ENCOUNTER — Telehealth: Payer: Self-pay | Admitting: Pharmacy Technician

## 2023-02-02 ENCOUNTER — Other Ambulatory Visit: Payer: Self-pay | Admitting: Gastroenterology

## 2023-02-02 ENCOUNTER — Other Ambulatory Visit (HOSPITAL_BASED_OUTPATIENT_CLINIC_OR_DEPARTMENT_OTHER): Payer: Self-pay

## 2023-02-02 ENCOUNTER — Ambulatory Visit: Payer: Medicare Other | Admitting: Psychiatry

## 2023-02-02 DIAGNOSIS — F3181 Bipolar II disorder: Secondary | ICD-10-CM | POA: Diagnosis not present

## 2023-02-02 MED ORDER — LINACLOTIDE 72 MCG PO CAPS
144.0000 ug | ORAL_CAPSULE | Freq: Every day | ORAL | 1 refills | Status: DC
  Filled 2023-02-02: qty 180, 90d supply, fill #0
  Filled 2023-05-27: qty 180, 90d supply, fill #1

## 2023-02-02 NOTE — Telephone Encounter (Signed)
Patient Advocate Encounter  Received notification from [facility] that prior authorization for LINZESS is required.   PA submitted on 5.1.24 Key [id number] Status is pending

## 2023-02-03 ENCOUNTER — Other Ambulatory Visit (HOSPITAL_BASED_OUTPATIENT_CLINIC_OR_DEPARTMENT_OTHER): Payer: Self-pay

## 2023-02-03 NOTE — Telephone Encounter (Signed)
[place] with [employer] called to speak about the PA for Linzess. Please call back at [phone]. She will send a fax about the information

## 2023-02-04 ENCOUNTER — Encounter: Payer: Self-pay | Admitting: Psychiatry

## 2023-02-04 ENCOUNTER — Other Ambulatory Visit (HOSPITAL_BASED_OUTPATIENT_CLINIC_OR_DEPARTMENT_OTHER): Payer: Self-pay

## 2023-02-07 ENCOUNTER — Other Ambulatory Visit: Payer: Self-pay

## 2023-02-07 DIAGNOSIS — F332 Major depressive disorder, recurrent severe without psychotic features: Secondary | ICD-10-CM | POA: Diagnosis not present

## 2023-02-08 DIAGNOSIS — D225 Melanocytic nevi of trunk: Secondary | ICD-10-CM | POA: Diagnosis not present

## 2023-02-08 DIAGNOSIS — L821 Other seborrheic keratosis: Secondary | ICD-10-CM | POA: Diagnosis not present

## 2023-02-08 DIAGNOSIS — L814 Other melanin hyperpigmentation: Secondary | ICD-10-CM | POA: Diagnosis not present

## 2023-02-08 DIAGNOSIS — L578 Other skin changes due to chronic exposure to nonionizing radiation: Secondary | ICD-10-CM | POA: Diagnosis not present

## 2023-02-08 DIAGNOSIS — Z86018 Personal history of other benign neoplasm: Secondary | ICD-10-CM | POA: Diagnosis not present

## 2023-02-08 DIAGNOSIS — L720 Epidermal cyst: Secondary | ICD-10-CM | POA: Diagnosis not present

## 2023-02-09 DIAGNOSIS — F3181 Bipolar II disorder: Secondary | ICD-10-CM | POA: Diagnosis not present

## 2023-02-10 ENCOUNTER — Inpatient Hospital Stay: Payer: Medicare Other | Admitting: Internal Medicine

## 2023-02-11 ENCOUNTER — Ambulatory Visit: Payer: Medicare Other | Admitting: Psychiatry

## 2023-02-14 DIAGNOSIS — F3130 Bipolar disorder, current episode depressed, mild or moderate severity, unspecified: Secondary | ICD-10-CM | POA: Diagnosis not present

## 2023-02-15 DIAGNOSIS — F3181 Bipolar II disorder: Secondary | ICD-10-CM | POA: Diagnosis not present

## 2023-02-16 ENCOUNTER — Encounter: Payer: Self-pay | Admitting: Gastroenterology

## 2023-02-16 DIAGNOSIS — Z9181 History of falling: Secondary | ICD-10-CM | POA: Diagnosis not present

## 2023-02-16 DIAGNOSIS — R2689 Other abnormalities of gait and mobility: Secondary | ICD-10-CM | POA: Diagnosis not present

## 2023-02-16 DIAGNOSIS — M6259 Muscle wasting and atrophy, not elsewhere classified, multiple sites: Secondary | ICD-10-CM | POA: Diagnosis not present

## 2023-02-17 ENCOUNTER — Other Ambulatory Visit: Payer: Self-pay

## 2023-02-17 ENCOUNTER — Other Ambulatory Visit (HOSPITAL_BASED_OUTPATIENT_CLINIC_OR_DEPARTMENT_OTHER): Payer: Self-pay

## 2023-02-17 ENCOUNTER — Other Ambulatory Visit: Payer: Self-pay | Admitting: Psychiatry

## 2023-02-17 ENCOUNTER — Other Ambulatory Visit (HOSPITAL_COMMUNITY): Payer: Self-pay

## 2023-02-17 DIAGNOSIS — F3181 Bipolar II disorder: Secondary | ICD-10-CM | POA: Diagnosis not present

## 2023-02-17 MED ORDER — CLONAZEPAM 0.5 MG PO TABS
1.0000 mg | ORAL_TABLET | Freq: Two times a day (BID) | ORAL | 0 refills | Status: DC
  Filled 2023-02-17: qty 120, 30d supply, fill #0

## 2023-02-17 NOTE — Telephone Encounter (Signed)
Patient Advocate Encounter  Prior Authorization for LINZESS has been approved with [employer].    PA# [id number] Effective dates: 5.2.24 through 5.1.25  Per [facility] test claim, ALREADY FILLED AT OTHER PHARMACY 5.3.24

## 2023-02-18 ENCOUNTER — Ambulatory Visit: Payer: Medicare Other | Admitting: Psychiatry

## 2023-02-18 ENCOUNTER — Other Ambulatory Visit: Payer: Self-pay

## 2023-02-18 ENCOUNTER — Other Ambulatory Visit (HOSPITAL_BASED_OUTPATIENT_CLINIC_OR_DEPARTMENT_OTHER): Payer: Self-pay

## 2023-02-18 DIAGNOSIS — F314 Bipolar disorder, current episode depressed, severe, without psychotic features: Secondary | ICD-10-CM | POA: Diagnosis not present

## 2023-02-21 ENCOUNTER — Ambulatory Visit: Payer: Medicare Other | Admitting: Gastroenterology

## 2023-02-21 DIAGNOSIS — F314 Bipolar disorder, current episode depressed, severe, without psychotic features: Secondary | ICD-10-CM | POA: Diagnosis not present

## 2023-02-22 ENCOUNTER — Telehealth: Payer: Self-pay | Admitting: Psychiatry

## 2023-02-22 DIAGNOSIS — R2689 Other abnormalities of gait and mobility: Secondary | ICD-10-CM | POA: Diagnosis not present

## 2023-02-22 DIAGNOSIS — M6259 Muscle wasting and atrophy, not elsewhere classified, multiple sites: Secondary | ICD-10-CM | POA: Diagnosis not present

## 2023-02-22 DIAGNOSIS — Z9181 History of falling: Secondary | ICD-10-CM | POA: Diagnosis not present

## 2023-02-22 NOTE — Telephone Encounter (Signed)
Yes.  That is perfect plan with clonazepam.

## 2023-02-22 NOTE — Telephone Encounter (Signed)
[name] notified

## 2023-02-22 NOTE — Telephone Encounter (Signed)
[employer] nurse called at 11am.  Meghan Oliver is doing better, receiving ECT twice this week. Not having breakthrough anxiety now and [name] wants to decrease her morning dose of Klonopin to help decrease her sedation. She wants to give her 1/2 of 0.5mg  Klonopin in the am and just give her regular dose the other two times a day. Is this okay.

## 2023-02-22 NOTE — Telephone Encounter (Signed)
Please see message from [name] and advise.

## 2023-02-23 DIAGNOSIS — F3181 Bipolar II disorder: Secondary | ICD-10-CM | POA: Diagnosis not present

## 2023-02-24 DIAGNOSIS — M6259 Muscle wasting and atrophy, not elsewhere classified, multiple sites: Secondary | ICD-10-CM | POA: Diagnosis not present

## 2023-02-24 DIAGNOSIS — Z9181 History of falling: Secondary | ICD-10-CM | POA: Diagnosis not present

## 2023-02-24 DIAGNOSIS — R2689 Other abnormalities of gait and mobility: Secondary | ICD-10-CM | POA: Diagnosis not present

## 2023-02-25 ENCOUNTER — Other Ambulatory Visit (HOSPITAL_BASED_OUTPATIENT_CLINIC_OR_DEPARTMENT_OTHER): Payer: Self-pay

## 2023-02-25 DIAGNOSIS — Z961 Presence of intraocular lens: Secondary | ICD-10-CM | POA: Diagnosis not present

## 2023-02-25 DIAGNOSIS — F314 Bipolar disorder, current episode depressed, severe, without psychotic features: Secondary | ICD-10-CM | POA: Diagnosis not present

## 2023-02-25 DIAGNOSIS — H35721 Serous detachment of retinal pigment epithelium, right eye: Secondary | ICD-10-CM | POA: Diagnosis not present

## 2023-02-25 DIAGNOSIS — H35453 Secondary pigmentary degeneration, bilateral: Secondary | ICD-10-CM | POA: Diagnosis not present

## 2023-02-25 DIAGNOSIS — H353122 Nonexudative age-related macular degeneration, left eye, intermediate dry stage: Secondary | ICD-10-CM | POA: Diagnosis not present

## 2023-02-25 DIAGNOSIS — H35363 Drusen (degenerative) of macula, bilateral: Secondary | ICD-10-CM | POA: Diagnosis not present

## 2023-02-25 DIAGNOSIS — H353211 Exudative age-related macular degeneration, right eye, with active choroidal neovascularization: Secondary | ICD-10-CM | POA: Diagnosis not present

## 2023-02-25 DIAGNOSIS — H5315 Visual distortions of shape and size: Secondary | ICD-10-CM | POA: Diagnosis not present

## 2023-02-25 MED ORDER — NEOMYCIN-POLYMYXIN-DEXAMETH 3.5-10000-0.1 OP OINT
TOPICAL_OINTMENT | OPHTHALMIC | 0 refills | Status: DC
  Filled 2023-02-25: qty 3.5, 7d supply, fill #0

## 2023-02-25 MED ORDER — OFLOXACIN 0.3 % OP SOLN
OPHTHALMIC | 0 refills | Status: DC
  Filled 2023-02-25: qty 5, 10d supply, fill #0

## 2023-03-01 ENCOUNTER — Other Ambulatory Visit: Payer: Self-pay

## 2023-03-01 DIAGNOSIS — F314 Bipolar disorder, current episode depressed, severe, without psychotic features: Secondary | ICD-10-CM | POA: Diagnosis not present

## 2023-03-02 DIAGNOSIS — F3181 Bipolar II disorder: Secondary | ICD-10-CM | POA: Diagnosis not present

## 2023-03-02 DIAGNOSIS — Z9181 History of falling: Secondary | ICD-10-CM | POA: Diagnosis not present

## 2023-03-02 DIAGNOSIS — R2689 Other abnormalities of gait and mobility: Secondary | ICD-10-CM | POA: Diagnosis not present

## 2023-03-02 DIAGNOSIS — M6259 Muscle wasting and atrophy, not elsewhere classified, multiple sites: Secondary | ICD-10-CM | POA: Diagnosis not present

## 2023-03-04 DIAGNOSIS — Z9181 History of falling: Secondary | ICD-10-CM | POA: Diagnosis not present

## 2023-03-04 DIAGNOSIS — R2689 Other abnormalities of gait and mobility: Secondary | ICD-10-CM | POA: Diagnosis not present

## 2023-03-04 DIAGNOSIS — M6259 Muscle wasting and atrophy, not elsewhere classified, multiple sites: Secondary | ICD-10-CM | POA: Diagnosis not present

## 2023-03-07 DIAGNOSIS — Z9181 History of falling: Secondary | ICD-10-CM | POA: Diagnosis not present

## 2023-03-07 DIAGNOSIS — M6259 Muscle wasting and atrophy, not elsewhere classified, multiple sites: Secondary | ICD-10-CM | POA: Diagnosis not present

## 2023-03-07 DIAGNOSIS — R2689 Other abnormalities of gait and mobility: Secondary | ICD-10-CM | POA: Diagnosis not present

## 2023-03-09 DIAGNOSIS — R2689 Other abnormalities of gait and mobility: Secondary | ICD-10-CM | POA: Diagnosis not present

## 2023-03-09 DIAGNOSIS — Z9181 History of falling: Secondary | ICD-10-CM | POA: Diagnosis not present

## 2023-03-09 DIAGNOSIS — M6259 Muscle wasting and atrophy, not elsewhere classified, multiple sites: Secondary | ICD-10-CM | POA: Diagnosis not present

## 2023-03-11 ENCOUNTER — Encounter: Payer: Self-pay | Admitting: Psychiatry

## 2023-03-11 ENCOUNTER — Ambulatory Visit (INDEPENDENT_AMBULATORY_CARE_PROVIDER_SITE_OTHER): Payer: Medicare Other | Admitting: Psychiatry

## 2023-03-11 DIAGNOSIS — F061 Catatonic disorder due to known physiological condition: Secondary | ICD-10-CM | POA: Diagnosis not present

## 2023-03-11 DIAGNOSIS — F411 Generalized anxiety disorder: Secondary | ICD-10-CM | POA: Diagnosis not present

## 2023-03-11 DIAGNOSIS — R7989 Other specified abnormal findings of blood chemistry: Secondary | ICD-10-CM

## 2023-03-11 DIAGNOSIS — F314 Bipolar disorder, current episode depressed, severe, without psychotic features: Secondary | ICD-10-CM | POA: Diagnosis not present

## 2023-03-11 NOTE — Progress Notes (Signed)
Meghan Oliver [id number] 13-Oct-1940 82 y.o.  Subjective:   Patient ID:  Meghan Oliver is a 82 y.o. (DOB 1941/09/16) female. Patient was last seen August 21, 2018 Chief Complaint:  Chief Complaint  Patient presents with   Follow-up   Depression   Memory Loss     DELRAE Oliver presents to the office today for follow-up of Severe TR bipolar depression with psychotic features including catatonia.  11/30/2018 was the last visit and the following was noted: Had ECT yesterday at [facility].  Last was Jan 8 and was doing well then.  Next scheduled April 15.  Had a lithium level from there which is pending. Pretty good until the last 10 days to 2 weeks with less energy and motivation.  Bothered by old sick dog.  Worried about how that will affect her.  Has slept with the dog.  Enjoyed [place].  Going to gym and playing [place].  Took the class and didn't feel she caught on quickly. Wonders if it is bc of the ECT memory effects.  Bridge is harder and not playing.  Planning to return to [facility] mid April.   spent the winter in [place] per usual..  No significant depression since here. ECT frequency about 6 weeks.  Gets very anxious the day of the ECT.  ECT consistently for a year.  Best in mood in 7-8 years.  Also the pacemaker helped.  Friends notice the benefit.  Plan:  Option increase in the nortriptyline if needed.  Considered this.  They agree and would like to increase nortriptyline to 75 to try to reduce tendency to depression before the ECT.   03/19/19  Addendum: Here the contents from an email from the nurse for [facility]  Hello Dr. Jennelle Oliver,   I hope you and your family are healthy and well. I took [facility] to [facility] for ECT and here are labs from 01/26/19. I am going to also forward them to [facility] for follow up with her LFT's. They are still higher than I would like.  Her lithium level was 0.51 on a daily dose of 300mg  qohs x 4 nights- alternating with 450mg  qohs x 3 nights. She is not demonstrating  any signs of elevated levels, minimal hand tremors, less anxiety and restlessness since her ECT treatment.  Her Nortryptyline level was 140 on 75mg  qhs.  I would like to continue her current dosages of both the above meds and recheck her lithium level at her next scheduled ECT of 03/23/19.  Please let me know of any changes you would like to make. Take care, Meghan Loft RN Lithium level was 0.4 on the 450mg  [place] with 300 mg QOD. No changes were made in meds.  11/15/2019 phone call: Telephone call from patient's husband Dr. Delray [facility] on 11/14/2019  Patient is scheduled for ECT soon for maintenance ECT.  He thinks it is been 2-1/2 months since the last ECT but he is going to check the date for sure.  It is still be done being done at [facility].  He is wondering about skipping this treatment because the patient has continued to be free of depression and seems cognitively clearer as these ECT treatments have been spread out further from each other.  Patient remains on medications as prescribed.  If it is truly been over 2 months since the last ECT then it is reasonable to consider discontinuing ECT.  It is unlikely that ECT with the frequency of greater than 2 months  is significantly helpful at preventing her relapse.  If however the ECT frequency is less than every 2 months it could still be helping to prevent recurrence.  He indicated he would consider this information and discuss it with her ECT team and make a decision.  They are heading to [place] in the next few weeks.  Needs to schedule an appointment with me for follow-up because it is been many months.  He agrees.   02/19/2020 appt, the following noted: Moving to [facility] in a few months.  Ready to downsize.   Stopped ECT as discussed and has not been more deprressed.   Walks dog 4 times daily.   Lithium 0.7 on  01/30/20 on lithium 300 mg plus 150 mg on M, W, F Nortriptyline 70 on 75 mg HS. No SE except tremor.  Balance is not great.  Very happy.    No depression since here.  No mood swings.  Sleep and appetite is OK.  No med changes:  04/03/2020 TC with the following noted: Patient's husband called stating that her tremor was a little worse and she was having some stutter. No evidence of stroke otherwise. First thing would be to check serum lithium level and BMP.  Order written.  Have asked her husband to let us know about the lab to which it should be sent.  04/22/20 TC witht he following noted: Lithium level is not dangerously high but it is has crept up to 1.0 which is higher than desired for her.  Our goal for her is 0.5-0.7.  At the current level it is likely causing side effects so we should reduce the dosage from lithium 300 mg plus 150 mg on M, W, F  TO 300 mg daily.  Meaning drop off the 150 mg capsule.  It will take about 2 weeks for the level to gradually come down to the desired level.  05/08/2020 appt with the following noted: Seen with H and [facility] nurse. Moving to [facility] and feels OK about it.  I think I'll be fine.   Pretty good with balance.  Doing yoga and it helps. [facility] has cancer. Old dog 22 & 1/2 yo still living. [facility]'s concerns when went to [place] for [facility] BD she had difficulty time with tremor lethargy, more confusion.  Better when go back home. [facility] notes using more lorazepam. At times gait is unsteady. Not significantly depressed.  Can be anxious at times.  Eating okay.  Sleeping okay.  Is easily confused under stress. Plan: reduce lithium to 300 mg nightly.  07/14/20 appt with the following noted: Seen with her husband as well as her family nurse. Feeling fine and pleased with that.   Some anxiety over the move to smaller place. Meg[facility] Oliver's twin also having heart problems [facility]. [facility]'s kids came and visited.    Plan: Continue nortriptyline 75 mg, paroxetine 20 mg, lithium 300 mg, Namenda 10 mg twice daily, lorazepam 0.5 mg 1/2-1 3 times daily as needed, mirtazapine 7.5 mg nightly  02/27/2021 appointment with  the following noted:  Seen with H and nurse [facility] Received email from nurse [facility] on 02/24/2021 indicated that he had moved into [facility] independent living in May.  Ezlynn has gradually become more depressed and more anxious using lorazepam as prescribed 3 times daily.  More memory issues.  She has remained engaged and initiating appropriate conversation.  She is very worried about her 54 year old dog who is in poor health and fears she will become more  depressed when the dog dies.  She and her husband do not wish to prefer to pursue further ECT unless absolutely necessary. Likes [facility] so far.   No depression by her report.  Sold big house in Feb.  It worked out well.   Worries about her dog 45 yo will die soon. H agrees she's done well with depression.   Enjoys things and has interests. Plan: Continue Lithium 300 mg daily  Continue nortriptyline 75 mg daily Continue paroxetine 20 mg daily Continue lorazepam 0.5 mg 3 times daily for both anxiety and catatonia Continue vitamin B6 and primidone for tremor Cerefolin NAC 2 daily.  05/13/2021 appointment with the following noted: Happy with [facility].  H and she agree is doing well with depression.[place] socialization. She complains of memory.   Family visited and getting along with Dondra Spry better.  H says Dondra Spry is acting better.  Goes to dinner and activities together now.  Better relationship with [facility].. Tremor has been OK.   Ativan usually 0.25 mg TID and tolerated. Anxiety managed with this generally. Molly dog health more stable. To [place] before T'giving until April.   Plan: Continue Lithium 300 mg daily  Continue nortriptyline 75 mg daily Continue paroxetine 20 mg daily Continue lorazepam 0.5 mg 3 times daily for both anxiety and catatonia Continue vitamin B6 and primidone for tremor   08/14/2021 appointment with the following noted: Seen with her husband [facility] and nurse [facility]. Everyone reports that her mood has been stable.  Her anxiety  is manageable with as needed lorazepam.  She is still happy with the transition to [facility].  They are preparing to go to [place] for the winter per usual but may sell their house father there. Tolerating meds without any unusual side effects. Is dealing with grief over the loss of her dog Molly.  03/10/2022 appt noted:  seen with H and nurse Oleta [facility]. [facility] not good phsyically hard to play golf.  Been really hard. He'll be 85 in July.  Some stress scheduling with [facility].   60 th wedding necessary this month.   Mood up and down.  Sister in law across the street can be difficult and demeaning.  She feels unsettled.  Some worry. Some back pain limiting activity.   Sleep is ok except with pain awakening.   No SE, except tremor some worse about 3-5 and helped by lorazepam 0.25 mg then No greater than 1 mg lorazepam daily Patient denies any recent difficulty with anxiety except as noted.  Patient denies difficulty with sleep initiation or maintenance. Denies appetite disturbance.    Patient has some difficulty with concentration.  Patient denies any suicidal ideation.  Usually takes Ativan at night and sleeps well 8 hours.   Plan no med changes  10/23/213 appt noted: Still doing well. Pneumonia and hosp for 6 days 3 weeks ago.  Viral.  Improving at this time. Increased tremor gradually in 3-4 mos helped by lorazepam avg 1.00-1.25 mg daily. No change in primidone. Total R hip August 2023 and done well.  Finishing home PT.   Appetite poor for 7-8 mos.  2 protein drinks daily but may not eat more than 1 good meal a day. [facility]. Substantial memory problems in [facility] and in unusual enviornments. Nurse notices decline.  B with LBD passed in August. No pain meds or muscle relaxants beyond pain. No depression.  H note sometimes she has less motivation than others. [facility] notes some normal waxing and waning of  mood. Good social interaction.   Not spending winter in [facility] this year.  D still  lives there. Plan: Increase mirtazapine 15 mg HS for appetite.  Also used for sleep and depression.  09/23/22 appt noted:  with nurse and [facility] feeling bad with more pain and fatigue.  M[facility] has CHF.   Made [place] sad.  Lost a cgood friends.  Rough time.  Mostly worry about [facility].  She feels afraid to be alone if something happens.   Not sig depressed.    Just worry . H can't be as active.  Taking a shower is hard.   She's back exercising again.   Went to [employer] for Thanksgiving in [facility] and plan to go back for Christmas. It's hard to travel. Told nurse she was depressed. Appetite is better and once asleep is OK. Using more lorazepam for anxiety 3-4 daily. Plan: Continue Lithium 300 mg daily  Continue nortriptyline 75 mg daily Continue paroxetine 20 mg daily Continue lorazepam 0.5 mg 3 times daily for both anxiety and catatonia Continue vitamin B6 and primidone for tremor Increased and it helped mirtazapine 15 mg HS for appetite.  Also used for sleep and depression.  10/19/22  RTC  [facility] reports not doing well.  Went to [facility].  Recent pneumonia.  They are thinking this is aspiration pneurmonia. Lethargic and anxious with tremor. Ruminating.  Hot to cold .  Low tolerance.  Going downhill quickly.  She's verbalizing depression and desire to isolate.  Won't go out.  Started within the last 2 weeks. Usually doesn't complain when she's sick.   [facility]'s biggest concern is that Doreatha Martin is having trouble handling this bc she's up at night rambling and not really safe to be up at night.    Will see her 10/22/22 Get lithium and nortriptyline levels.   Meredith Staggers, MD, [facility]     10/22/22 emergency appt with nurse [facility] and H [facility]: Pneumonia Oct and then again 2nd week January.  It appears to have largely resoved.  It's really bad.  Nausea. Anxious bc [facility] ill.  Doesn't want him to leave.  [facility] sees anxiety triggering anxiety with little to no appetite.  Hypersensitive to stimuli like light and noises.  Doesn't want  TV on.  [facility] says totally reversed in last 6 weeks.    [facility] reports she's overemotional.  Friends died. Losing wt 91.4# today. More trouble with sleep. Lorazepam increased to 2 mg daily.  Not sleepy with it.  Doesn't seem to be helpful. Last week neglect hygiene but better this week a little. [facility] says STM is poor at times and at times disoriented if in [place].  Will walk in bathroom and wonder where sh is. Barium swallow pending for evaluation of refulex.    10/26/22 RTC:  RTC   No improvement since Friday.  Gradually less engaged.  Can be overstimulated with visitors.   Disc nortriptyline level 115 is a little higher than it was and higher levels can cause SE in elderly.   Reduce nortriptyline to 50 mg HS. Taking lorazepam    Consider low dose of Vraylar or pramipexole.  Or Spravato.     Meredith Staggers, MD, DFAPA     11/15/22 appt noted: Dialogue with Dr. Shirlee Latch cardiologist agreed pt should be stable enough from CV perspective to receive Spravato which could elevate BP.  If that occurs use prn metoprolol 25 mg. Disc in detail with Dr. Corinda Gubler and he agrees [place] is appropriate though not FDA approved  for bipolar depression.  Disc 2 metanalysis in support of this and this alernative is safer and likely better tolerated than the option of ECT and faster than options of trials of Trintellix, Auvelity, Latuda etc. Tolerating meds.  She is depressed and very anxious and intermittently confused.  Taking clothes on and off bc rapid changes in comfort.  Worried over [employer].  Sleep disrupted appetite some better with mirtazapine. Received Spravato 56 mg first time today and was dissociated.  Resolved over 2 hours.  No HA, NV.  Was distressed moderately and very ambivalent about Tx.  H and nurse supportive of tx plan and for the pt.    11/24/22 appt noted: Current meds: nortriptyline 50 mg HS, paroxetine 20 mg daily, lithium 300 mg nightly, mirtazapine 30 mg nightly, memantine 10 mg twice daily.   Lorazepam 0.5 mg every 4 to 6 hours as needed catatonia or severe anxiety. Received first dose of Spravato 84 mg today.  She was more noticeably dissociated than with the 56 mg dose.  It did resolve over the 2-hour course of observation although she remained somewhat unsteady and needed to leave via wheelchair.  She did not have headache nausea or vomiting.  She remains ambivalent about the treatment. Husband notes she has continued to be anxious and depressed and was extremely anxious yesterday and somewhat confused.  At times would say things that did not make sense.  He remained supportive of the treatment while she remains ambivalent about the treatment.  Her appetite is good and she is eating well.  Sleep is variable.  H reported at times will be almost normal after receiving an extra dose of lorazepam 0.5 mg prn but this only lasts an hour or so. Plan: Continue Lithium 300 mg daily  Continue nortriptyline 50 mg daily Continue paroxetine 20 mg daily Continue vitamin B6 and primidone for tremor Imirtazapine 30 mg HS for appetite,  used for sleep and depression.  Appetite better Switch lorazepam to [facility] ER 2 mg AM and use lorazepam 0.5 mg prn.  For catatonia in hopes of more consistent relief.  Decision based on observation of periods of almost normalcy after some BZ doses.  We discussed potential switch of paroxetine and nortriptyline to Auvelity or Trintellix or possibly Jordan.  However these switches will be difficult and it is hoped that the Spravato may provide enough improvement to allow a switch in medicine more tolerable.  11/26/22 appt noted: Current meds: nortriptyline 50 mg HS, paroxetine 20 mg daily, lithium 300 mg nightly, mirtazapine 30 mg nightly, memantine 10 mg twice daily.  Lorazepam 0.5 mg every 4 to 6 hours as needed catatonia or severe anxiety.  Started Loreev 2 mg daily over weekend Received Spravato 84 mg today.   Spravato with expected dissociation.  She didn't experience  relief from negative emotion.  Dissociation resolved over observation period.  No N, V, HA.   Still high anxiety and dread of most everything.  Depressed with negative thought predominates.  H notes periods of confusion and then maybe an hour of clarity sometimes after the dosing of lorazepam.  Then back to severe sx.  Eating is fair. No SI.  Fearful generally.  Needs help with ADLs.  Cannot take meds on her own. Tolerating meds without SE No noticeable effect from Loreev in terms of benefit or SE  12/01/22 appt noted: Received a phone call from nurse [facility] this morning to call as soon as possible this patient very agitated and confused.  Thrashing around in bed.  At times talking about dying out of the fear that she was going to die.  Highly anxious.  Not very cooperative with drinking water or taking medicines this morning. Discussed the situation with her husband as well and we have to options: 1's if we can get her to the office to receive Spravato is scheduled today that may provide some immediate relief and forestall and may be prevent hospitalization.  The second option is to proceed directly to hospitalization.  If she is unable to take nutrition and hydration hospitalization will be necessary. Discussed that appeared to be catatonic hyperactivity and agitation which has not responded to the extended release lorazepam 2 mg a day.  Therefore she was given 1 mg of Ativan this morning and did calm down enough to make it to the Spravato administration this afternoon.  She was able to cooperate with Spravato administration and tolerated it well with typical levels of dissociation which gradually resolved over the 2 hours.  She was able to eat and drink fluids prior to coming to the appointment. Current meds: nortriptyline 50 mg HS, paroxetine 20 mg daily, lithium 300 mg nightly,  memantine 10 mg twice daily.  Lorazepam 0.5 mg every 4 to 6 hours as needed catatonia or severe anxiety.  Started Loreev 2 mg  daily over weekend olanazpine 2.5 mg HS Received Spravato 84 mg today.   Husband notes that after Spravato administration on Monday she was much improved for a period of several hours and was able to have conversations with her family members and make decisions about distributing some of her jewelry and other family decisions.  However over Monday evening into Tuesday she became more agitated and confused and anxious again.  There have been no obvious effects of taking the olanzapine but it does not appear that long-acting lorazepam 2 mg as provided any significant relief. Plan: Continue Lithium 300 mg daily  Continue nortriptyline 50 mg daily Continue paroxetine 20 mg daily Continue vitamin B6 and primidone for tremor DC mirtazapine For catatonia: lorazepam0.5 mg q 4 hour mg daily as tolerated for catatonia with dose spread through the day..  Nurse manages. Restart Loreeve 2 mg daily and use prn lorazepam for catatonia Increase olanzapine 5 mg HS for TRD, anxiety, catatonia, delusions of persecution  12/06/22 appt noted; Psych meds: as above except increase Loreev to 2 mg BID for severe catatonia. nortriptyline 50 mg HS, paroxetine  20 mg daily, lithium 300 mg nightly,  memantine 10 mg twice daily.  Lorazepam 0.5 mg every 4 to 6 hours as needed catatonia or severe anxiety.  Started Loreev 2 mg increased BID for severe catatonia,  increased olanazpine 5 mg HS for 3 days. Received 5th dose of Spravato 84 mg today.    She was able to cooperate with Spravato administration and tolerated it well with typical levels of dissociation which gradually resolved over the 2 hours.  She was able to eat and drink fluids prior to coming to the appointment.   She still feels confused and depressed and anxious. Per family had periods over the weekend where she was confused thinking her H was dead even though he was right in front of her.  Also thinking she is going to die and not get better.  Is eating and drinking  fluids well.  Has needed extra lorazepam to sleep.  Limited periods of lucidity per H. Tolerating high dose lorazepam but limited benefit for catatonia. Plan: Continue Lithium 300 mg  daily  reduce nortriptyline 25 mg daily in anticipation of change Reduce paroxetine 10 mg daily in anticipation of change Continue vitamin B6 and primidone for tremor For catatonia: lorazepam0.5 mg q 4 hour mg daily as tolerated for catatonia with dose spread through the day..  Nurse manages. DT severity of catatonic hyperactivy Loreeve 2 mg BID and use prn lorazepam for catatonia Continue olanzapine 5 mg HS for TRD, anxiety, catatonia, delusions of persecution  12/08/22 appt noted: Psych meds:  Loreev to 2 mg BID for severe catatonia. nortriptyline 25 mg HS, paroxetine 10 mg daily, lithium 300 mg nightly,  memantine 10 mg twice daily.  Lorazepam 0.5 mg every 4 to 6 hours as needed catatonia or severe anxiety.  Started Loreev 2 mg increased BID for severe catatonia,  increased olanazpine 5 mg HS for 3 days. Received 6th dose of Spravato 84 mg today.    She was able to cooperate with Spravato administration and tolerated it well with typical levels of dissociation which gradually resolved over the 2 hours.  She was able to eat and drink fluids prior to coming to the appointment.   She still feels confused and depressed and anxious. Hopeless.  Thoughts she is going to die. Per nurse and H no sig improvement noted with spravato.   No current alcohol problems  01/14/2023 appointment on an urgent basis: Hospitalized due to psychiatry at [facility] from 12/10/2022 until 01/03/2023.  Hospitalized for bipolar disorder severe depression with catatonia.  Received ECT and Latuda 60 mg daily was started.  Stopped paroxetine, lithium, nortriptyline, and mirtazapine.  Lorazepam was switched to clonazepam. Current psych meds clonazepam 0.5 mg twice daily, Latuda 60 mg daily.  Memantine 10 mg twice daily, weekly ECT rec Wt 101# and  better appetite.  Sleep not great but ok.  Will lie awake some.  H says it's pretty good. Alert in the day.  No SE with meds.  Tremor better.   Lorazepam only on ECT days. Plan:  For catatonia: Continue clonazepam 0.5 mg twice daily also for anxiety Continue Latuda 60 mg daily Continue lorazepam 0.5 mg as needed which is currently being used rarely. Recommend continue ECT  weekly as [facility] recommends  02/22/23    [employer] nurse called at 11am.  Quinette is doing better, receiving ECT twice this week. Not having breakthrough anxiety now and [facility] wants to decrease her morning dose of Klonopin to help decrease her sedation. She wants to give her 1/2 of 0.5mg  Klonopin in the am and just give her regular dose the other two times a day. Is this okay.        Electronically signed by Soundra Pilon L at 02/22/2023 11:21 AM  03/11/23 appt:  Current psych med:  clonazepam 0.25 mg AM and 0.5 mg HS on 5/21, Latuda 60,  Worry anxiety.  Not as happy as those around here. Reduced ECT to weekly does not maintain the benefit.  But they didn't want to continue it.   [facility] has  talked with ECT doc about holding off on ECT and missed dose but she's gotten more depressed.   Did twice weekly for awhle and it was beneficial.  Sched for Tuesday    ECT-MADRS    Flowsheet Row Clinical Support from 12/08/2022 in [facility]  MADRS Total Score 54      PHQ2-9    Flowsheet Row Office Visit from 01/14/2023 in [facility], [facility] Office Visit from 10/11/2022 in [place]  Lenord Fellers, MD Office Visit from 08/17/2022 in [facility], MD Office Visit from 07/19/2022 in [facility], MD Office Visit from 03/24/2022 in [facility], MD  PHQ-2 Total Score 4 6 0 0 1  PHQ-9 Total Score 14 -- -- -- --      Flowsheet Row ED to Hosp-Admission (Discharged) from 07/01/2022 in [facility] Admission (Discharged) from 05/18/2022 in [facility]  Pre-Admission Testing 60 from 05/10/2022 in [facility]-PRE-SURGICAL TEST[facility]  C-SSRS RISK CATEGORY No Risk No Risk No Risk        Past Psychiatric Medication Trials:  Prozac 40 with lithium and Zyprexa 2.5 to 5 mg,  nortriptyline, paroxetine, Effexor 225 Wellbutrin 300 mirtazapine,  Others  Pramipexole 0.625 daily hallucinations  Rexulti EPS, Abilify 15, Vraylar 1.5 1 week,  Latuda 10 mg SE, Olanzapine 10 briefly Caplyta briefly no response   Lamotrigine level 7.5  Namenda,  lorazepam,  Clonazaam 0.5 BID  no response Spravato after 6 tx of 84 mg and 2 of 56 mg .  this list is not exhaustive  Review of Systems:  Review of Systems  Constitutional:  Positive for fatigue. Negative for appetite change.  Cardiovascular:  Negative for palpitations.  Gastrointestinal:  Positive for constipation. Negative for nausea.       Linzess and Mozantic managed   Musculoskeletal:  Positive for gait problem.  Neurological:  Positive for dizziness and weakness. Negative for tremors.       Some balance issues  Psychiatric/Behavioral:  Positive for decreased concentration. Negative for agitation, behavioral problems, confusion, dysphoric mood, hallucinations, self-injury, sleep disturbance and suicidal ideas. The patient is nervous/anxious. The patient is not hyperactive.     Medications: I have reviewed the patient's current medications.  Current Outpatient Medications  Medication Sig Dispense Refill   amLODipine (NORVASC) 10 MG tablet Take 1 tablet (10 mg total) by mouth daily. 90 tablet 1   amoxicillin (AMOXIL) 500 MG capsule Take 3 capsules (1,500 mg total) by mouth 1 hour prior to dental work. 9 capsule 1   amoxicillin (AMOXIL) 500 MG capsule Take 1 capsule (500 mg total) by mouth 4 (four) times daily for 7 days 28 capsule 0   amoxicillin-clavulanate (AUGMENTIN) 875-125 MG tablet Take 1 tablet by mouth 2 (two) times daily. 14 tablet 0   aspirin 81 MG chewable tablet  Chew 1 tablet (81 mg total) by mouth daily.     Calcium Carbonate-Vitamin D (CALCIUM 600/VITAMIN D PO) Take 1 tablet by mouth 2 (two) times daily.     cetirizine (ZYRTEC) 10 MG tablet Take 10 mg by mouth as needed for allergies.     Cholecalciferol (VITAMIN D3) 50 MCG (2000 UT) TABS Take 2,000 Units by mouth in the morning.     clonazePAM (KLONOPIN) 0.5 MG tablet Take 2 tablets (1 mg total) by mouth 2 (two) times daily. (Patient taking differently: Take 1 mg by mouth 2 (two) times daily. 1/2 tabe in AM and 1 tablet at night) 120 tablet 0   COVID-19 mRNA vaccine 2023-2024 (COMIRNATY) syringe Inject into the muscle. 0.3 mL 0   dexlansoprazole (DEXILANT) 60 MG capsule Take 1 capsule by mouth daily 90 capsule 3   fesoterodine (TOVIAZ) 8 MG TB24 tablet Take 1 tablet (8 mg total) by mouth daily. 90 tablet 3   glycerin adult 2 g suppository Use 1 suppository as needed if you have not had a bowel movement in  4 to 5 days 25 suppository  0   hydrochlorothiazide (HYDRODIURIL) 12.5 MG tablet Take 1 tablet (12.5 mg total) by mouth daily. 90 tablet 1   influenza vaccine adjuvanted (FLUAD QUADRIVALENT) 0.5 ML injection Inject into the muscle. 0.5 mL 0   Krill Oil 500 MG CAPS Take 500 mg by mouth daily.     lactulose (CHRONULAC) 10 GM/15ML solution Take 30 mLs (20 g total) by mouth 3 (three) times daily between meals as needed for mild constipation. (Patient taking differently: Take 20 g by mouth daily as needed for mild constipation.) 473 mL 3   Lavender Oil 80 MG CAPS Take 160 mg by mouth at bedtime.     levothyroxine (SYNTHROID) 75 MCG tablet Take 1 tablet (75 mcg total) by mouth daily. 90 tablet 2   levothyroxine (SYNTHROID) 75 MCG tablet Take 1 tablet (75 mcg total) by mouth daily. 90 tablet 2   levothyroxine (SYNTHROID) 75 MCG tablet Take 1 tablet (75 mcg total) by mouth daily. 90 tablet 2   linaclotide (LINZESS) 72 MCG capsule Take 2 capsules (144 mcg total) by mouth daily before breakfast. 180 capsule 1    losartan (COZAAR) 100 MG tablet Take 1 tablet (100 mg total) by mouth daily. 90 tablet 1   Lurasidone HCl 60 MG TABS Take 1 tablet (60 mg total) by mouth daily after supper. 90 tablet 0   magic mouthwash w/lidocaine SOLN Take 5 mLs by mouth 3 (three) times daily as needed for mouth pain. (Patient taking differently: Take 5 mLs by mouth as needed for mouth pain.) 120 mL 0   memantine (NAMENDA) 10 MG tablet Take 1 tablet (10 mg total) by mouth 2 (two) times daily. 180 tablet 1   metoprolol tartrate (LOPRESSOR) 25 MG tablet Take 1 tablet (25 mg total) by mouth as needed. 45 tablet 3   Multiple Vitamins-Minerals (MULTIVITAMIN WITH MINERALS) tablet Take 1 tablet by mouth daily.     Multiple Vitamins-Minerals (PRESERVISION AREDS 2 PO) Take 1 capsule by mouth in the morning and at bedtime.     naloxegol oxalate (MOVANTIK) 12.5 MG TABS tablet Take 2 tablets (25 mg total) by mouth daily. (Patient taking differently: Take 25 mg by mouth daily. PRN) 60 tablet 3   neomycin-polymyxin b-dexamethasone (MAXITROL) 3.5-10000-0.1 OINT Apply thin line of ointment to right eye twice daily for 1 week after injection. 3.5 g 0   ofloxacin (OCUFLOX) 0.3 % ophthalmic solution Instill 1 drop into right eye four times daily for 3-4 days after injection. 5 mL 0   ondansetron (ZOFRAN-ODT) 4 MG disintegrating tablet Place 1 tablet every 6 hours by translingual route as needed. (Patient taking differently: Take by mouth as needed.) 20 tablet 0   pneumococcal 20-valent conjugate vaccine (PREVNAR 20) 0.5 ML injection Inject into the muscle. 0.5 mL 0   polyethylene glycol (MIRALAX / GLYCOLAX) 17 g packet Take 17 g by mouth as needed for mild constipation.     primidone (MYSOLINE) 50 MG tablet Take 2 tablets (100 mg total) by mouth every morning and 1 tablet every evening. 270 tablet 0   Pyridoxine HCl (VITAMIN B-6) 500 MG tablet Take 500 mg by mouth 2 (two) times daily.     RSV vaccine recomb adjuvanted (AREXVY) 120 MCG/0.5ML injection  Inject into the muscle. 0.5 mL 0   sennosides-docusate sodium (SENOKOT-S) 8.6-50 MG tablet Take 2 tablets by mouth as needed.     thiamine (VITAMIN B-1) 100 MG tablet Take 1 tablet (100 mg total) by mouth daily. 100 tablet 0   valACYclovir (  VALTREX) 1000 MG tablet Take 2 tablets by mouth at onset, then 2 tablets 12 hours later. (Patient taking differently: Take 2,000 mg by mouth as needed. PRn) 30 tablet 1   vitamin B-12 (CYANOCOBALAMIN) 1000 MCG tablet Take 1,000 mcg by mouth at bedtime.     LORazepam (ATIVAN) 0.5 MG tablet TAKE 2 TABLETS (1 MG)  BY MOUTH EVERY NIGHT AT BEDTIME AND 1 TABLET EVERY 4 HOURS AS NEEDED FOR ANXIETY (Patient not taking: Reported on 03/11/2023) 270 tablet 0   No current facility-administered medications for this visit.    Medication Side Effects: Other: tremor  stable.  No worse.  Allergies:  Allergies  Allergen Reactions   Propranolol Other (See Comments)    Low blood pressure. Bradycardia    Brexpiprazole Other (See Comments)    Aphasia and catatonia    Past Medical History:  Diagnosis Date   Anxiety    Arthritis    "hips, spine" (03/17/2018)   Bipolar II disorder (HCC)    CHF (congestive heart failure) (HCC)    Chronic bronchitis (HCC)    Chronic lower back pain    Chronic right hip pain    CKD (chronic kidney disease), stage II    Coronary artery disease    stent x1   Esophagitis, erosive    GAD (generalized anxiety disorder)    GERD (gastroesophageal reflux disease)    Headache    "maybe monthly" (03/17/2018))   Heart murmur, systolic    History of adenomatous polyp of colon    08-04-2016  tubular adenoma   History of blood transfusion 12/2017   "related to vascular hematoma"   History of electroconvulsive therapy    at [facility]--  started 04-15-2015 to 11-17-2016  total greater than 40 times   History of hiatal hernia    Hyperlipidemia    Hypertension    Hypothyroidism    Internal carotid artery stenosis, bilateral    per last duplex  05-01-2014  bilateral ICA 40-59%   Major depression, chronic    ECT treatments extensive and multiple started 07/ 2016   Memory loss    "both short and long-term; needs frequent reminders to follow instrucitons" (05/16/2017)   Migraines    "none in years" (03/17/2018)   OSA (obstructive sleep apnea)    per study 06/ 2012 moderate OSA  ; "refuses to wear masks" (03/17/2018)   Osteoporosis    Pneumonia 07/29/2022   Poor historian    due to short term memory loss   Presence of permanent cardiac pacemaker 03/17/2018   Pulmonary nodule    monitored by pcp   S/P placement of cardiac pacemaker 03/17/18 Meghan Jude  03/18/2018   Short-term memory loss    Sick sinus syndrome (HCC)     Family History  Problem Relation Age of Onset   Heart attack Father 73       deceased   Hypertension Father    Heart disease Father    Dementia Brother    Parkinson's disease Brother    Heart disease Brother    Breast cancer Paternal Aunt        Age 26's   Breast cancer Paternal Grandmother        Age unknown   Colon cancer Neg Hx     Social History   Socioeconomic History   Marital status: Married    Spouse name: Dr. Victorino Dike   Number of children: 2   Years of education: Not on file   Highest education level: Not  on file  Occupational History   Occupation: housewife    Employer: UNEMPLOYED  Tobacco Use   Smoking status: Former    Packs/day: 2.00    Years: 15.00    Additional pack years: 0.00    Total pack years: 30.00    Types: Cigarettes    Quit date: 01/13/1971    Years since quitting: 52.1    Passive exposure: Never   Smokeless tobacco: Never  Vaping Use   Vaping Use: Never used  Substance and Sexual Activity   Alcohol use: Yes    Comment: occassional 1 x a week   Drug use: Never   Sexual activity: Not Currently    Comment: intercourse age 29, sexual partners less than 5  Other Topics Concern   Not on file  Social History Narrative   Right handed   Social Determinants of  Health   Financial Resource Strain: Not on file  Food Insecurity: No Food Insecurity (07/06/2022)   Hunger Vital Sign    Worried About Running Out of Food in the Last Year: Never true    Ran Out of Food in the Last Year: Never true  Transportation Needs: No Transportation Needs (07/06/2022)   PRAPARE - Administrator, Civil Service (Medical): No    Lack of Transportation (Non-Medical): No  Physical Activity: Not on file  Stress: Not on file  Social Connections: Not on file  Intimate Partner Violence: Not At Risk (07/06/2022)   Humiliation, Afraid, Rape, and Kick questionnaire    Fear of Current or Ex-Partner: No    Emotionally Abused: No    Physically Abused: No    Sexually Abused: No    Past Medical History, Surgical history, Social history, and Family history were reviewed and updated as appropriate.   Please see review of systems for further details on the patient's review from today.   Objective:   Physical Exam:  LMP  (LMP Unknown)   Physical Exam Constitutional:      General: She is not in acute distress.    Appearance: She is well-developed.  Musculoskeletal:        General: No deformity.  Neurological:     Mental Status: She is alert and oriented to person, place, and time.     Motor: No tremor.     Coordination: Coordination abnormal.     Comments: Cane use  Psychiatric:        Attention and Perception: She is attentive.        Mood and Affect: Mood is anxious and depressed. Affect is not labile, blunt or inappropriate.        Speech: Speech is not slurred.        Behavior: Behavior normal. Behavior is not slowed, withdrawn or combative.        Thought Content: Thought content is not delusional. Thought content does not include homicidal or suicidal ideation. Thought content does not include suicidal plan.        Cognition and Memory: She exhibits impaired recent memory.        Judgment: Judgment normal.     Comments: Insight fair to good. No  auditory or visual hallucinations.  Depression and anxiety are remarkably improved but not resolved. Delusional guilt not elicited Affect pleasant and smiling     Lab Review:     Component Value Date/Time   NA 138 01/14/2023 1242   NA 137 12/29/2022 0000   K 4.0 01/14/2023 1242   CL 103 01/14/2023 1242  CO2 23 01/14/2023 1242   GLUCOSE 87 01/14/2023 1242   BUN 19 01/14/2023 1242   BUN 12 12/29/2022 0000   CREATININE 0.68 01/14/2023 1242   CALCIUM 8.4 (L) 01/14/2023 1242   PROT 6.9 11/11/2022 1106   PROT 6.2 04/20/2017 1151   ALBUMIN 3.9 09/02/2022 1221   ALBUMIN 3.9 04/20/2017 1151   AST 44 (H) 11/11/2022 1106   [place] 86 (H) 11/11/2022 1106   ALKPHOS 64 09/02/2022 1221   BILITOT 0.2 11/11/2022 1106   BILITOT <0.2 04/20/2017 1151   GFRNONAA >60 07/08/2022 0429   GFRNONAA 50 (L) 07/10/2020 1021   GFRAA 58 (L) 07/10/2020 1021       Component Value Date/Time   WBC 6.3 12/29/2022 0000   WBC 6.6 11/11/2022 1106   RBC 3.88 11/11/2022 1106   HGB 10.3 (A) 12/29/2022 0000   HGB 10.2 (L) 04/20/2017 1151   HCT 32 (A) 12/29/2022 0000   HCT 29.6 (L) 04/20/2017 1151   PLT 231 12/29/2022 0000   PLT 385 (H) 04/20/2017 1151   MCV 93.6 11/11/2022 1106   MCV 89 04/20/2017 1151   MCH 31.2 11/11/2022 1106   MCHC 33.3 11/11/2022 1106   RDW 13.7 11/11/2022 1106   RDW 13.9 04/20/2017 1151   LYMPHSABS 1,439 11/11/2022 1106   LYMPHSABS 1.5 04/20/2017 1151   MONOABS 0.7 07/07/2022 0458   EOSABS 112 11/11/2022 1106   EOSABS 0.3 04/20/2017 1151   BASOSABS 53 11/11/2022 1106   BASOSABS 0.0 04/20/2017 1151    Lithium Lvl  Date Value Ref Range Status  10/21/2022 0.5 (L) 0.6 - 1.2 mmol/L Final  10/21/2022 lithium level 0.5 on 300 mg nightly is stable.,  Nortriptyline 115 on 75 mg HS.  08/02/22 nortriptyline 85, lihtium 0.5  Nortriptyline level 51 on 50 mg nightly with paroxetine 20 mg daily  07/10/2020 lithium level 0.6 at [facility] office and nortriptyline level 130 on the  current dosages of lithium 300 mg nightly and nortriptyline 75 mg nightly  02/20/2021 labs lithium 0.6 on 300 mg nightly.  Nortriptyline level 49 which is lower than expected on 75 mg nightly along with paroxetine 20 mg daily.    No results found for: "PHENYTOIN", "PHENOBARB", "VALPROATE", "CBMZ"   .res Assessment: Plan:    Severe bipolar I disorder, current or most recent episode depressed, with catatonia (HCC)  Generalized anxiety disorder  Low vitamin D level    History of lithium toxicity repeatedly while on chlorthalidone.  50-minute visit today.  Hx TX resistant bipolar depression with catatonia.  Recent decompensation and rapidly getting worse with some cognive impairment and mild psychotic sx and altered mental status.Depression and anxiety much worse in the last several weeks.  Her case has been highly complex and treatment resistant at times.  She had remarkedly improved with the ECT + change in psychiatric medicine to Latuda 60 mg daily with clonazepam 0.5 mg twice daily. She is not ruminative.  Affect is subdued.  She iis not as bad as before ECT.  Catatonia is resolved but she is anhedonic, asocial, less talkative.   She is still anxious and somewhat insecure.  Short-term memory impairment is consistent with what would be expected with the ECT at this time.  options of trials of Trintellix, Auvelity, Latuda etc. Trintellix 5 mg daily for 1 week then 10 mg  daily  BP has been ok.    However no response Spravato after 6 tx of 84 mg and 2 of 56 mg .  Has had  previous sessions after which she had some coherence but only last ed a few hours at most..  Hospitalized at [facility] on psychiatric unit from 12/10/2022 to 01/03/2023 and received ECT and started Latuda 60 mg daily.  Stopped paroxetine, nortriptyline, and lithium.  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  During the severe depression she has catatonia.  She had required  longterm ECT.  A previous episode of depression approximately 10 years ago stayed in remission on Paxil and nortriptyline and lithium for a period of years until the lithium level was decreased due to tremor.  Now we are maintaining an adequate lithium level.  She has had several episodes of lithium toxicity with hospitalization.  This resolved once she stopped chlorthalidone.  From records it appears her last ECT was in September 2020.   Disc Dr. Blossom Hoops concerns about longterm ECT on cognition.  Reviewed the old chart and consider very low-dose Vraylar or perhaps pramipexole but risk latter could worsen psychosis.  Disc therapy for her  through Dr. Terrall Laity  3-4 times.  Didn't seem to help.  Seeing another person at [facility].  Trouble with memory interferes with some of the benefit.  For catatonia: Continue clonazepam 0.5 mg twice daily also for anxiety Continue Latuda 60 mg daily Continue lorazepam 0.5 mg as needed which is currently being used rarely. Start Trintellix off label for dep.  Trying to find solution that would allow stopping ECT  Recommend continue ECT  but increase to twice weekly as [facility] recommends  Meredith Staggers MD, DFAPA Please see After Visit Summary for patient specific instructions.  Future Appointments  Date Time Provider Department [place]  04/04/2023  3:20 PM [facility] DEVICE REMOTES [facility]  04/15/2023 10:00 AM Cottle, Steva Ready., MD [facility] None  07/04/2023  3:20 PM [facility] DEVICE REMOTES [facility]  10/03/2023  7:00 AM [facility] DEVICE REMOTES [facility]      No orders of the defined types were placed in this encounter.      -------------------------------

## 2023-03-15 DIAGNOSIS — F314 Bipolar disorder, current episode depressed, severe, without psychotic features: Secondary | ICD-10-CM | POA: Diagnosis not present

## 2023-03-18 DIAGNOSIS — F3130 Bipolar disorder, current episode depressed, mild or moderate severity, unspecified: Secondary | ICD-10-CM | POA: Diagnosis not present

## 2023-03-21 ENCOUNTER — Other Ambulatory Visit (HOSPITAL_BASED_OUTPATIENT_CLINIC_OR_DEPARTMENT_OTHER): Payer: Self-pay

## 2023-03-21 ENCOUNTER — Other Ambulatory Visit: Payer: Self-pay | Admitting: Psychiatry

## 2023-03-21 DIAGNOSIS — F061 Catatonic disorder due to known physiological condition: Secondary | ICD-10-CM

## 2023-03-21 MED ORDER — LURASIDONE HCL 60 MG PO TABS
1.0000 | ORAL_TABLET | Freq: Every day | ORAL | 0 refills | Status: DC
Start: 2023-03-21 — End: 2023-05-20
  Filled 2023-03-21: qty 90, 90d supply, fill #0
  Filled 2023-03-22 – 2023-03-30 (×2): qty 30, 30d supply, fill #0
  Filled 2023-04-20 – 2023-04-25 (×2): qty 30, 30d supply, fill #1

## 2023-03-22 ENCOUNTER — Other Ambulatory Visit: Payer: Self-pay

## 2023-03-22 ENCOUNTER — Other Ambulatory Visit (HOSPITAL_BASED_OUTPATIENT_CLINIC_OR_DEPARTMENT_OTHER): Payer: Self-pay

## 2023-03-22 ENCOUNTER — Other Ambulatory Visit: Payer: Self-pay | Admitting: Psychiatry

## 2023-03-22 DIAGNOSIS — F3130 Bipolar disorder, current episode depressed, mild or moderate severity, unspecified: Secondary | ICD-10-CM | POA: Diagnosis not present

## 2023-03-22 DIAGNOSIS — F319 Bipolar disorder, unspecified: Secondary | ICD-10-CM | POA: Diagnosis not present

## 2023-03-22 DIAGNOSIS — G3184 Mild cognitive impairment, so stated: Secondary | ICD-10-CM

## 2023-03-22 MED ORDER — MEMANTINE HCL 10 MG PO TABS
10.0000 mg | ORAL_TABLET | Freq: Two times a day (BID) | ORAL | 1 refills | Status: DC
Start: 2023-03-22 — End: 2023-07-19
  Filled 2023-03-22: qty 180, 90d supply, fill #0
  Filled 2023-07-02 (×2): qty 180, 90d supply, fill #1

## 2023-03-22 MED ORDER — CLONAZEPAM 0.5 MG PO TABS
1.0000 mg | ORAL_TABLET | Freq: Two times a day (BID) | ORAL | 0 refills | Status: DC
  Filled 2023-03-22: qty 360, 90d supply, fill #0

## 2023-03-22 MED ORDER — CLONIDINE HCL 0.1 MG PO TABS
0.1000 mg | ORAL_TABLET | ORAL | 2 refills | Status: DC
  Filled 2023-03-22: qty 20, 30d supply, fill #0
  Filled 2023-03-23 – 2023-04-02 (×3): qty 20, 20d supply, fill #0
  Filled 2023-04-20: qty 20, 20d supply, fill #1
  Filled 2023-05-16: qty 20, 20d supply, fill #2

## 2023-03-22 NOTE — Telephone Encounter (Signed)
[name] called requesting Clonazepam Rx 0.5 mg BID. 90 day if possible. 30 day if not. [facility].

## 2023-03-23 ENCOUNTER — Other Ambulatory Visit: Payer: Self-pay

## 2023-03-23 ENCOUNTER — Other Ambulatory Visit (HOSPITAL_BASED_OUTPATIENT_CLINIC_OR_DEPARTMENT_OTHER): Payer: Self-pay

## 2023-03-25 DIAGNOSIS — F314 Bipolar disorder, current episode depressed, severe, without psychotic features: Secondary | ICD-10-CM | POA: Diagnosis not present

## 2023-03-28 ENCOUNTER — Other Ambulatory Visit (HOSPITAL_BASED_OUTPATIENT_CLINIC_OR_DEPARTMENT_OTHER): Payer: Self-pay

## 2023-03-28 DIAGNOSIS — H353211 Exudative age-related macular degeneration, right eye, with active choroidal neovascularization: Secondary | ICD-10-CM | POA: Diagnosis not present

## 2023-03-28 MED ORDER — NEOMYCIN-POLYMYXIN-DEXAMETH 3.5-10000-0.1 OP OINT
TOPICAL_OINTMENT | OPHTHALMIC | 1 refills | Status: AC
  Filled 2023-03-28: qty 3.5, 7d supply, fill #0
  Filled 2023-03-31 – 2023-04-04 (×3): qty 3.5, 7d supply, fill #1

## 2023-03-28 MED ORDER — OFLOXACIN 0.3 % OP SOLN
OPHTHALMIC | 1 refills | Status: DC
  Filled 2023-03-28: qty 5, 7d supply, fill #0
  Filled 2023-03-31 – 2023-04-04 (×3): qty 5, 7d supply, fill #1

## 2023-03-29 ENCOUNTER — Telehealth: Payer: Self-pay | Admitting: Internal Medicine

## 2023-03-29 DIAGNOSIS — F314 Bipolar disorder, current episode depressed, severe, without psychotic features: Secondary | ICD-10-CM | POA: Diagnosis not present

## 2023-03-29 DIAGNOSIS — R350 Frequency of micturition: Secondary | ICD-10-CM

## 2023-03-29 NOTE — Telephone Encounter (Signed)
Results have been relayed to the patient/authorized caretaker. The patient/authorized caretaker verbalized understanding. No questions at this time.   

## 2023-03-29 NOTE — Telephone Encounter (Signed)
[name] the RN that takes care of Meghan [facility] called and asked if a UA order can be put in so they can get it done at [facility].She said she just has increased frequency.  Call back is [phone]

## 2023-03-30 ENCOUNTER — Other Ambulatory Visit (HOSPITAL_BASED_OUTPATIENT_CLINIC_OR_DEPARTMENT_OTHER): Payer: Self-pay

## 2023-03-31 ENCOUNTER — Other Ambulatory Visit (HOSPITAL_BASED_OUTPATIENT_CLINIC_OR_DEPARTMENT_OTHER): Payer: Self-pay

## 2023-04-01 ENCOUNTER — Other Ambulatory Visit (HOSPITAL_BASED_OUTPATIENT_CLINIC_OR_DEPARTMENT_OTHER): Payer: Self-pay

## 2023-04-01 DIAGNOSIS — N189 Chronic kidney disease, unspecified: Secondary | ICD-10-CM | POA: Diagnosis not present

## 2023-04-01 DIAGNOSIS — M549 Dorsalgia, unspecified: Secondary | ICD-10-CM | POA: Diagnosis not present

## 2023-04-01 DIAGNOSIS — E039 Hypothyroidism, unspecified: Secondary | ICD-10-CM | POA: Diagnosis not present

## 2023-04-01 DIAGNOSIS — F314 Bipolar disorder, current episode depressed, severe, without psychotic features: Secondary | ICD-10-CM | POA: Diagnosis not present

## 2023-04-01 DIAGNOSIS — G4730 Sleep apnea, unspecified: Secondary | ICD-10-CM | POA: Diagnosis not present

## 2023-04-01 DIAGNOSIS — I130 Hypertensive heart and chronic kidney disease with heart failure and stage 1 through stage 4 chronic kidney disease, or unspecified chronic kidney disease: Secondary | ICD-10-CM | POA: Diagnosis not present

## 2023-04-01 DIAGNOSIS — I5032 Chronic diastolic (congestive) heart failure: Secondary | ICD-10-CM | POA: Diagnosis not present

## 2023-04-01 DIAGNOSIS — K3184 Gastroparesis: Secondary | ICD-10-CM | POA: Diagnosis not present

## 2023-04-01 DIAGNOSIS — I509 Heart failure, unspecified: Secondary | ICD-10-CM | POA: Diagnosis not present

## 2023-04-01 DIAGNOSIS — F419 Anxiety disorder, unspecified: Secondary | ICD-10-CM | POA: Diagnosis not present

## 2023-04-01 DIAGNOSIS — Z950 Presence of cardiac pacemaker: Secondary | ICD-10-CM | POA: Diagnosis not present

## 2023-04-01 DIAGNOSIS — K219 Gastro-esophageal reflux disease without esophagitis: Secondary | ICD-10-CM | POA: Diagnosis not present

## 2023-04-01 DIAGNOSIS — I2510 Atherosclerotic heart disease of native coronary artery without angina pectoris: Secondary | ICD-10-CM | POA: Diagnosis not present

## 2023-04-01 DIAGNOSIS — G8929 Other chronic pain: Secondary | ICD-10-CM | POA: Diagnosis not present

## 2023-04-01 DIAGNOSIS — M25551 Pain in right hip: Secondary | ICD-10-CM | POA: Diagnosis not present

## 2023-04-02 ENCOUNTER — Other Ambulatory Visit (HOSPITAL_BASED_OUTPATIENT_CLINIC_OR_DEPARTMENT_OTHER): Payer: Self-pay

## 2023-04-04 ENCOUNTER — Telehealth: Payer: Self-pay | Admitting: Psychiatry

## 2023-04-04 ENCOUNTER — Other Ambulatory Visit: Payer: Self-pay

## 2023-04-04 ENCOUNTER — Other Ambulatory Visit (HOSPITAL_BASED_OUTPATIENT_CLINIC_OR_DEPARTMENT_OTHER): Payer: Self-pay

## 2023-04-04 MED ORDER — VORTIOXETINE HBR 10 MG PO TABS
10.0000 mg | ORAL_TABLET | Freq: Every day | ORAL | 1 refills | Status: DC
  Filled 2023-04-04: qty 30, 30d supply, fill #0
  Filled 2023-04-20 – 2023-04-28 (×2): qty 30, 30d supply, fill #1

## 2023-04-04 NOTE — Telephone Encounter (Signed)
Sent!

## 2023-04-04 NOTE — Telephone Encounter (Signed)
[name] the nurse called and said that Meghan [facility] is doing well on the trintellix 10 mg samples. She would like a script of trintellix 10 mg sent cone  outpatient pharmacy at [facility]

## 2023-04-05 ENCOUNTER — Other Ambulatory Visit (HOSPITAL_BASED_OUTPATIENT_CLINIC_OR_DEPARTMENT_OTHER): Payer: Self-pay

## 2023-04-05 DIAGNOSIS — K219 Gastro-esophageal reflux disease without esophagitis: Secondary | ICD-10-CM | POA: Diagnosis not present

## 2023-04-05 DIAGNOSIS — G4730 Sleep apnea, unspecified: Secondary | ICD-10-CM | POA: Diagnosis not present

## 2023-04-05 DIAGNOSIS — I10 Essential (primary) hypertension: Secondary | ICD-10-CM | POA: Diagnosis not present

## 2023-04-05 DIAGNOSIS — Z951 Presence of aortocoronary bypass graft: Secondary | ICD-10-CM | POA: Diagnosis not present

## 2023-04-05 DIAGNOSIS — E039 Hypothyroidism, unspecified: Secondary | ICD-10-CM | POA: Diagnosis not present

## 2023-04-05 DIAGNOSIS — I2510 Atherosclerotic heart disease of native coronary artery without angina pectoris: Secondary | ICD-10-CM | POA: Diagnosis not present

## 2023-04-05 DIAGNOSIS — Z87891 Personal history of nicotine dependence: Secondary | ICD-10-CM | POA: Diagnosis not present

## 2023-04-05 DIAGNOSIS — F419 Anxiety disorder, unspecified: Secondary | ICD-10-CM | POA: Diagnosis not present

## 2023-04-05 DIAGNOSIS — F312 Bipolar disorder, current episode manic severe with psychotic features: Secondary | ICD-10-CM | POA: Diagnosis not present

## 2023-04-05 DIAGNOSIS — N189 Chronic kidney disease, unspecified: Secondary | ICD-10-CM | POA: Diagnosis not present

## 2023-04-05 DIAGNOSIS — Z950 Presence of cardiac pacemaker: Secondary | ICD-10-CM | POA: Diagnosis not present

## 2023-04-05 DIAGNOSIS — Z79899 Other long term (current) drug therapy: Secondary | ICD-10-CM | POA: Diagnosis not present

## 2023-04-12 DIAGNOSIS — F3130 Bipolar disorder, current episode depressed, mild or moderate severity, unspecified: Secondary | ICD-10-CM | POA: Diagnosis not present

## 2023-04-12 DIAGNOSIS — M25559 Pain in unspecified hip: Secondary | ICD-10-CM | POA: Diagnosis not present

## 2023-04-12 DIAGNOSIS — G8929 Other chronic pain: Secondary | ICD-10-CM | POA: Diagnosis not present

## 2023-04-12 DIAGNOSIS — N189 Chronic kidney disease, unspecified: Secondary | ICD-10-CM | POA: Diagnosis not present

## 2023-04-12 DIAGNOSIS — I2510 Atherosclerotic heart disease of native coronary artery without angina pectoris: Secondary | ICD-10-CM | POA: Diagnosis not present

## 2023-04-12 DIAGNOSIS — I5032 Chronic diastolic (congestive) heart failure: Secondary | ICD-10-CM | POA: Diagnosis not present

## 2023-04-12 DIAGNOSIS — G4730 Sleep apnea, unspecified: Secondary | ICD-10-CM | POA: Diagnosis not present

## 2023-04-12 DIAGNOSIS — E039 Hypothyroidism, unspecified: Secondary | ICD-10-CM | POA: Diagnosis not present

## 2023-04-12 DIAGNOSIS — M549 Dorsalgia, unspecified: Secondary | ICD-10-CM | POA: Diagnosis not present

## 2023-04-12 DIAGNOSIS — K219 Gastro-esophageal reflux disease without esophagitis: Secondary | ICD-10-CM | POA: Diagnosis not present

## 2023-04-12 DIAGNOSIS — F419 Anxiety disorder, unspecified: Secondary | ICD-10-CM | POA: Diagnosis not present

## 2023-04-15 ENCOUNTER — Encounter: Payer: Self-pay | Admitting: Psychiatry

## 2023-04-15 ENCOUNTER — Ambulatory Visit (INDEPENDENT_AMBULATORY_CARE_PROVIDER_SITE_OTHER): Payer: Medicare Other | Admitting: Psychiatry

## 2023-04-15 DIAGNOSIS — E039 Hypothyroidism, unspecified: Secondary | ICD-10-CM | POA: Diagnosis not present

## 2023-04-15 DIAGNOSIS — I2510 Atherosclerotic heart disease of native coronary artery without angina pectoris: Secondary | ICD-10-CM | POA: Diagnosis not present

## 2023-04-15 DIAGNOSIS — G3184 Mild cognitive impairment, so stated: Secondary | ICD-10-CM

## 2023-04-15 DIAGNOSIS — F411 Generalized anxiety disorder: Secondary | ICD-10-CM

## 2023-04-15 DIAGNOSIS — K219 Gastro-esophageal reflux disease without esophagitis: Secondary | ICD-10-CM | POA: Diagnosis not present

## 2023-04-15 DIAGNOSIS — F314 Bipolar disorder, current episode depressed, severe, without psychotic features: Secondary | ICD-10-CM

## 2023-04-15 DIAGNOSIS — F061 Catatonic disorder due to known physiological condition: Secondary | ICD-10-CM

## 2023-04-15 DIAGNOSIS — I5032 Chronic diastolic (congestive) heart failure: Secondary | ICD-10-CM | POA: Diagnosis not present

## 2023-04-15 DIAGNOSIS — F3163 Bipolar disorder, current episode mixed, severe, without psychotic features: Secondary | ICD-10-CM | POA: Diagnosis not present

## 2023-04-15 DIAGNOSIS — G4730 Sleep apnea, unspecified: Secondary | ICD-10-CM | POA: Diagnosis not present

## 2023-04-15 NOTE — Progress Notes (Signed)
Meghan Oliver [id number] 1941-06-06 82 y.o.  Subjective:   Patient ID:  Meghan Oliver is a 82 y.o. (DOB [birth year]) female. Patient was last seen August 21, 2018 Chief Complaint:  Chief Complaint  Patient presents with   Follow-up   Depression   Memory Loss   Anxiety     [name] presents to the office today for follow-up of Severe TR bipolar depression with psychotic features including catatonia.  11/30/2018 was the last visit and the following was noted: Had ECT yesterday at [facility].  Last was Jan 8 and was doing well then.  Next scheduled April 15.  Had a lithium level from there which is pending. Pretty good until the last 10 days to 2 weeks with less energy and motivation.  Bothered by old sick dog.  Worried about how that will affect her.  Has slept with the dog.  [place]ed [place].  Going to gym and playing [place].  Took the class and didn't feel she caught on quickly. Wonders if it is bc of the ECT memory effects.  Bridge is harder and not playing.  Planning to return to [facility] mid April.   spent the winter in [place] per usual..  No significant depression since here. ECT frequency about 6 weeks.  Gets very anxious the day of the ECT.  ECT consistently for a year.  Best in mood in 7-8 years.  Also the pacemaker helped.  Friends notice the benefit.  Plan:  Option increase in the nortriptyline if needed.  Considered this.  They agree and would like to increase nortriptyline to 75 to try to reduce tendency to depression before the ECT.   03/19/19  Addendum: Here the contents from an email from the nurse for Ms. Millon  Hello [name],   I hope you and your family are healthy and well. I took Jyl to [facility] for ECT and here are labs from 01/26/19. I am going to also forward them to [facility] for follow up with her LFT's. They are still higher than I would like.  Her lithium level was 0.51 on a daily dose of 300mg  qohs x 4 nights- alternating with 450mg  qohs x 3 nights. She is not  demonstrating any signs of elevated levels, minimal hand tremors, less anxiety and restlessness since her ECT treatment.  Her Nortryptyline level was 140 on 75mg  qhs.  I would like to continue her current dosages of both the above meds and recheck her lithium level at her next scheduled ECT of 03/23/19.  Please let me know of any changes you would like to make. Take care, [name] Lithium level was 0.4 on the 450mg  alt with 300 mg QOD. No changes were made in meds.  11/15/2019 phone call: Telephone call from patient's husband [name] on 11/14/2019  Patient is scheduled for ECT soon for maintenance ECT.  He thinks it is been 2-1/2 months since the last ECT but he is going to check the date for sure.  It is still be done being done at [facility].  He is wondering about skipping this treatment because the patient has continued to be free of depression and seems cognitively clearer as these ECT treatments have been spread out further from each other.  Patient remains on medications as prescribed.  If it is truly been over 2 months since the last ECT then it is reasonable to consider discontinuing ECT.  It is unlikely that ECT with the frequency of greater  than 2 months is significantly helpful at preventing her relapse.  If however the ECT frequency is less than every 2 months it could still be helping to prevent recurrence.  He indicated he would consider this information and discuss it with her ECT team and make a decision.  They are heading to [place] in the next few weeks.  Needs to schedule an appointment with me for follow-up because it is been many months.  He agrees.   02/19/2020 appt, the following noted: Moving to [facility] in a few months.  Ready to downsize.   Stopped ECT as discussed and has not been more deprressed.   Walks dog 4 times daily.   Lithium 0.7 on  01/30/20 on lithium 300 mg plus 150 mg on M, W, F Nortriptyline 70 on 75 mg HS. No SE except tremor.  Balance is not  great.  Very happy.   No depression since here.  No mood swings.  Sleep and appetite is OK.  No med changes:  04/03/2020 TC with the following noted: Patient's husband called stating that her tremor was a little worse and she was having some stutter. No evidence of stroke otherwise. First thing would be to check serum lithium level and BMP.  Order written.  Have asked her husband to let us know about the lab to which it should be sent.  04/22/20 TC witht he following noted: Lithium level is not dangerously high but it is has crept up to 1.0 which is higher than desired for her.  Our goal for her is 0.5-0.7.  At the current level it is likely causing side effects so we should reduce the dosage from lithium 300 mg plus 150 mg on M, W, F  TO 300 mg daily.  Meaning drop off the 150 mg capsule.  It will take about 2 weeks for the level to gradually come down to the desired level.  05/08/2020 appt with the following noted: Seen with H and [name] nurse. Moving to [facility] and feels OK about it.  I think I'll be fine.   Pretty good with balance.  Doing yoga and it helps. [name] has cancer. Old dog 78 & 1/2 yo still living. [name]'s concerns when went to [place] for D's BD she had difficulty time with tremor lethargy, more confusion.  Better when go back home. [name] notes using more lorazepam. At times gait is unsteady. Not significantly depressed.  Can be anxious at times.  Eating okay.  Sleeping okay.  Is easily confused under stress. Plan: reduce lithium to 300 mg nightly.  07/14/20 appt with the following noted: Seen with her husband as well as her family nurse. Feeling fine and pleased with that.   Some anxiety over the move to smaller place. [name]'s twin also having heart problems Gene. [name]'s kids came and visited.    Plan: Continue nortriptyline 75 mg, paroxetine 20 mg, lithium 300 mg, Namenda 10 mg twice daily, lorazepam 0.5 mg 1/2-1 3 times daily as needed, mirtazapine 7.5 mg  nightly  02/27/2021 appointment with the following noted:  Seen with H and nurse [name] Received email from nurse [name] on 02/24/2021 indicated that he had moved into [facility] living in May.  Copelyn has gradually become more depressed and more anxious using lorazepam as prescribed 3 times daily.  More memory issues.  She has remained engaged and initiating appropriate conversation.  She is very worried about her 9 year old dog who is in poor health and fears she  will become more depressed when the dog dies.  She and her husband do not wish to prefer to pursue further ECT unless absolutely necessary. Likes [facility] so far.   No depression by her report.  Sold big house in Feb.  It worked out well.   Worries about her dog 44 yo will die soon. H agrees she's done well with depression.   Enjoys things and has interests. Plan: Continue Lithium 300 mg daily  Continue nortriptyline 75 mg daily Continue paroxetine 20 mg daily Continue lorazepam 0.5 mg 3 times daily for both anxiety and catatonia Continue vitamin B6 and primidone for tremor Cerefolin NAC 2 daily.  05/13/2021 appointment with the following noted: Happy with [facility].  H and she agree is doing well with depression.Per[place] socialization. She complains of memory.   Family visited and getting along with Dondra Spry better.  H says Dondra Spry is acting better.  Goes to dinner and activities together now.  Better relationship with Gene.. Tremor has been OK.   Ativan usually 0.25 mg TID and tolerated. Anxiety managed with this generally. Molly dog health more stable. To [place] before T'giving until April.   Plan: Continue Lithium 300 mg daily  Continue nortriptyline 75 mg daily Continue paroxetine 20 mg daily Continue lorazepam 0.5 mg 3 times daily for both anxiety and catatonia Continue vitamin B6 and primidone for tremor   08/14/2021 appointment with the following noted: Seen with her husband [name] and nurse [name]. Everyone reports that  her mood has been stable.  Her anxiety is manageable with as needed lorazepam.  She is still happy with the transition to [facility].  They are preparing to go to [place] for the winter per usual but may sell their house father there. Tolerating meds without any unusual side effects. Is dealing with grief over the loss of her dog Molly.  03/10/2022 appt noted:  seen with H and nurse Oleta Mouse [facility] house. [name] not good phsyically hard to play golf.  Been really hard. He'll be 85 in July.  Some stress scheduling with D's.   60 th wedding necessary this month.   Mood up and down.  Sister in law across the street can be difficult and demeaning.  She feels unsettled.  Some worry. Some back pain limiting activity.   Sleep is ok except with pain awakening.   No SE, except tremor some worse about 3-5 and helped by lorazepam 0.25 mg then No greater than 1 mg lorazepam daily Patient denies any recent difficulty with anxiety except as noted.  Patient denies difficulty with sleep initiation or maintenance. Denies appetite disturbance.    Patient has some difficulty with concentration.  Patient denies any suicidal ideation.  Usually takes Ativan at night and sleeps well 8 hours.   Plan no med changes  10/23/213 appt noted: Still doing well. Pneumonia and hosp for 6 days 3 weeks ago.  Viral.  Improving at this time. Increased tremor gradually in 3-4 mos helped by lorazepam avg 1.00-1.25 mg daily. No change in primidone. Total R hip August 2023 and done well.  Finishing home PT.   Appetite poor for 7-8 mos.  2 protein drinks daily but may not eat more than 1 good meal a day. [facility]. Substantial memory problems in hospital and in unusual enviornments. Nurse notices decline.  B with LBD passed in August. No pain meds or muscle relaxants beyond pain. No depression.  H note sometimes she has less motivation than others. [name] notes some normal waxing  and waning of mood. Good social interaction.   Not  spending winter in [facility] this year.  D still lives there. Plan: Increase mirtazapine 15 mg HS for appetite.  Also used for sleep and depression.  09/23/22 appt noted:  with nurse and [name] [name] feeling bad with more pain and fatigue.  [name] has CHF.   Made Jezreel sad.  Lost a cgood friends.  Rough time.  Mostly worry about [name].  She feels afraid to be alone if something happens.   Not sig depressed.    Just worry . H can't be as active.  Taking a shower is hard.   She's back exercising again.   Went to [employer] for Thanksgiving in [facility] and plan to go back for Christmas. It's hard to travel. Told nurse she was depressed. Appetite is better and once asleep is OK. Using more lorazepam for anxiety 3-4 daily. Plan: Continue Lithium 300 mg daily  Continue nortriptyline 75 mg daily Continue paroxetine 20 mg daily Continue lorazepam 0.5 mg 3 times daily for both anxiety and catatonia Continue vitamin B6 and primidone for tremor Increased and it helped mirtazapine 15 mg HS for appetite.  Also used for sleep and depression.  10/19/22  RTC  [name] reports not doing well.  Went to [facility].  Recent pneumonia.  They are thinking this is aspiration pneurmonia. Lethargic and anxious with tremor. Ruminating.  Hot to cold .  Low tolerance.  Going downhill quickly.  She's verbalizing depression and desire to isolate.  Won't go out.  Started within the last 2 weeks. Usually doesn't complain when she's sick.   [name]'s biggest concern is that Doreatha Martin is having trouble handling this bc she's up at night rambling and not really safe to be up at night.    Will see her 10/22/22 Get lithium and nortriptyline levels.   Meredith Staggers, MD, [facility]     10/22/22 emergency appt with nurse [name] and H [name]: Pneumonia Oct and then again 2nd week January.  It appears to have largely resoved.  It's really bad.  Nausea. Anxious bc [name] ill.  Doesn't want him to leave.  [name] sees anxiety triggering anxiety with little to no appetite.  Hypersensitive to  stimuli like light and noises.  Doesn't want TV on.  [name] says totally reversed in last 6 weeks.    [name] reports she's overemotional.  Friends died. Losing wt 91.4# today. More trouble with sleep. Lorazepam increased to 2 mg daily.  Not sleepy with it.  Doesn't seem to be helpful. Last week neglect hygiene but better this week a little. [name] says STM is poor at times and at times disoriented if in [place].  Will walk in bathroom and wonder where sh is. Barium swallow pending for evaluation of refulex.    10/26/22 RTC:  RTC   No improvement since Friday.  Gradually less engaged.  Can be overstimulated with visitors.   Disc nortriptyline level 115 is a little higher than it was and higher levels can cause SE in elderly.   Reduce nortriptyline to 50 mg HS. Taking lorazepam    Consider low dose of Vraylar or pramipexole.  Or Spravato.     Meredith Staggers, MD, [facility]AP[facility]     11/15/22 appt noted: Dialogue with Dr. Shirlee Latch cardiologist agreed pt should be stable enough from CV perspective to receive Spravato which could elevate BP.  If that occurs use prn metoprolol 25 mg. Disc in detail with Dr. Corinda Gubler and he agrees [place] is appropriate though  not FDA approved for bipolar depression.  Disc 2 metanalysis in support of this and this alernative is safer and likely better tolerated than the option of ECT and faster than options of trials of Trintellix, Auvelity, Latuda etc. Tolerating meds.  She is depressed and very anxious and intermittently confused.  Taking clothes on and off bc rapid changes in comfort.  Worried over [employer].  Sleep disrupted appetite some better with mirtazapine. Received Spravato 56 mg first time today and was dissociated.  Resolved over 2 hours.  No HA, NV.  Was distressed moderately and very ambivalent about Tx.  H and nurse supportive of tx plan and for the pt.    11/24/22 appt noted: Current meds: nortriptyline 50 mg HS, paroxetine 20 mg daily, lithium 300 mg nightly, mirtazapine  30 mg nightly, memantine 10 mg twice daily.  Lorazepam 0.5 mg every 4 to 6 hours as needed catatonia or severe anxiety. Received first dose of Spravato 84 mg today.  She was more noticeably dissociated than with the 56 mg dose.  It did resolve over the 2-hour course of observation although she remained somewhat unsteady and needed to leave via wheelchair.  She did not have headache nausea or vomiting.  She remains ambivalent about the treatment. Husband notes she has continued to be anxious and depressed and was extremely anxious yesterday and somewhat confused.  At times would say things that did not make sense.  He remained supportive of the treatment while she remains ambivalent about the treatment.  Her appetite is good and she is eating well.  Sleep is variable.  H reported at times will be almost normal after receiving an extra dose of lorazepam 0.5 mg prn but this only lasts an hour or so. Plan: Continue Lithium 300 mg daily  Continue nortriptyline 50 mg daily Continue paroxetine 20 mg daily Continue vitamin B6 and primidone for tremor Imirtazapine 30 mg HS for appetite,  used for sleep and depression.  Appetite better Switch lorazepam to [facility] 2 ER 2 mg AM and use lorazepam 0.5 mg prn.  For catatonia in hopes of more consistent relief.  Decision based on observation of periods of almost normalcy after some BZ doses.  We discussed potential switch of paroxetine and nortriptyline to Auvelity or Trintellix or possibly [place].  However these switches will be difficult and it is hoped that the Spravato may provide enough improvement to allow a switch in medicine more tolerable.  11/26/22 appt noted: Current meds: nortriptyline 50 mg HS, paroxetine 20 mg daily, lithium 300 mg nightly, mirtazapine 30 mg nightly, memantine 10 mg twice daily.  Lorazepam 0.5 mg every 4 to 6 hours as needed catatonia or severe anxiety.  Started Loreev 2 mg daily over weekend Received Spravato 84 mg today.   Spravato with  expected dissociation.  She didn't experience relief from negative emotion.  Dissociation resolved over observation period.  No N, V, HA.   Still high anxiety and dread of most everything.  Depressed with negative thought predominates.  H notes periods of confusion and then maybe an hour of clarity sometimes after the dosing of lorazepam.  Then back to severe sx.  Eating is fair. No SI.  Fearful generally.  Needs help with ADLs.  Cannot take meds on her own. Tolerating meds without SE No noticeable effect from Loreev in terms of benefit or SE  12/01/22 appt noted: Received a phone call from nurse [name] this morning to call as soon as possible this patient very agitated  and confused.  Thrashing around in bed.  At times talking about dying out of the fear that she was going to die.  Highly anxious.  Not very cooperative with drinking water or taking medicines this morning. Discussed the situation with her husband as well and we have to options: 1's if we can get her to the office to receive Spravato is scheduled today that may provide some immediate relief and forestall and may be prevent hospitalization.  The second option is to proceed directly to hospitalization.  If she is unable to take nutrition and hydration hospitalization will be necessary. Discussed that appeared to be catatonic hyperactivity and agitation which has not responded to the extended release lorazepam 2 mg a day.  Therefore she was given 1 mg of Ativan this morning and did calm down enough to make it to the Spravato administration this afternoon.  She was able to cooperate with Spravato administration and tolerated it well with typical levels of dissociation which gradually resolved over the 2 hours.  She was able to eat and drink fluids prior to coming to the appointment. Current meds: nortriptyline 50 mg HS, paroxetine 20 mg daily, lithium 300 mg nightly,  memantine 10 mg twice daily.  Lorazepam 0.5 mg every 4 to 6 hours as needed  catatonia or severe anxiety.  Started Loreev 2 mg daily over weekend olanazpine 2.5 mg HS Received Spravato 84 mg today.   Husband notes that after Spravato administration on Monday she was much improved for a period of several hours and was able to have conversations with her family members and make decisions about distributing some of her jewelry and other family decisions.  However over Monday evening into Tuesday she became more agitated and confused and anxious again.  There have been no obvious effects of taking the olanzapine but it does not appear that long-acting lorazepam 2 mg as provided any significant relief. Plan: Continue Lithium 300 mg daily  Continue nortriptyline 50 mg daily Continue paroxetine 20 mg daily Continue vitamin B6 and primidone for tremor DC mirtazapine For catatonia: lorazepam0.5 mg q 4 hour mg daily as tolerated for catatonia with dose spread through the day..  Nurse manages. Restart Loreeve 2 mg daily and use prn lorazepam for catatonia Increase olanzapine 5 mg HS for TRD, anxiety, catatonia, delusions of persecution  12/06/22 appt noted; Psych meds: as above except increase Loreev to 2 mg BID for severe catatonia. nortriptyline 50 mg HS, paroxetine  20 mg daily, lithium 300 mg nightly,  memantine 10 mg twice daily.  Lorazepam 0.5 mg every 4 to 6 hours as needed catatonia or severe anxiety.  Started Loreev 2 mg increased BID for severe catatonia,  increased olanazpine 5 mg HS for 3 days. Received 5th dose of Spravato 84 mg today.    She was able to cooperate with Spravato administration and tolerated it well with typical levels of dissociation which gradually resolved over the 2 hours.  She was able to eat and drink fluids prior to coming to the appointment.   She still feels confused and depressed and anxious. Per family had periods over the weekend where she was confused thinking her H was dead even though he was right in front of her.  Also thinking she is going to  die and not get better.  Is eating and drinking fluids well.  Has needed extra lorazepam to sleep.  Limited periods of lucidity per H. Tolerating high dose lorazepam but limited benefit for catatonia. Plan: Continue  Lithium 300 mg daily  reduce nortriptyline 25 mg daily in anticipation of change Reduce paroxetine 10 mg daily in anticipation of change Continue vitamin B6 and primidone for tremor For catatonia: lorazepam0.5 mg q 4 hour mg daily as tolerated for catatonia with dose spread through the day..  Nurse manages. DT severity of catatonic hyperactivy Loreeve 2 mg BID and use prn lorazepam for catatonia Continue olanzapine 5 mg HS for TRD, anxiety, catatonia, delusions of persecution  12/08/22 appt noted: Psych meds:  Loreev to 2 mg BID for severe catatonia. nortriptyline 25 mg HS, paroxetine 10 mg daily, lithium 300 mg nightly,  memantine 10 mg twice daily.  Lorazepam 0.5 mg every 4 to 6 hours as needed catatonia or severe anxiety.  Started Loreev 2 mg increased BID for severe catatonia,  increased olanazpine 5 mg HS for 3 days. Received 6th dose of Spravato 84 mg today.    She was able to cooperate with Spravato administration and tolerated it well with typical levels of dissociation which gradually resolved over the 2 hours.  She was able to eat and drink fluids prior to coming to the appointment.   She still feels confused and depressed and anxious. Hopeless.  Thoughts she is going to die. Per nurse and H no sig improvement noted with spravato.   No current alcohol problems  01/14/2023 appointment on an urgent basis: Hospitalized due to psychiatry at [facility] from 12/10/2022 until 01/03/2023.  Hospitalized for bipolar disorder severe depression with catatonia.  Received ECT and Latuda 60 mg daily was started.  Stopped paroxetine, lithium, nortriptyline, and mirtazapine.  Lorazepam was switched to clonazepam. Current psych meds clonazepam 0.5 mg twice daily, Latuda 60 mg daily.  Memantine 10  mg twice daily, weekly ECT rec Wt 101# and better appetite.  Sleep not great but ok.  Will lie awake some.  H says it's pretty good. Alert in the day.  No SE with meds.  Tremor better.   Lorazepam only on ECT days. Plan:  For catatonia: Continue clonazepam 0.5 mg twice daily also for anxiety Continue Latuda 60 mg daily Continue lorazepam 0.5 mg as needed which is currently being used rarely. Recommend continue ECT  weekly as [facility] recommends  02/22/23    [employer] nurse called at 11am.  Tyrina is doing better, receiving ECT twice this week. Not having breakthrough anxiety now and [name] wants to decrease her morning dose of Klonopin to help decrease her sedation. She wants to give her 1/2 of 0.5mg  Klonopin in the am and just give her regular dose the other two times a day. Is this okay.        Electronically signed by Soundra Pilon L at 02/22/2023 11:21 AM  03/11/23 appt:  Current psych med:  clonazepam 0.25 mg AM and 0.5 mg HS on 5/21, Latuda 60,  Worry anxiety.  Not as happy as those around here. Reduced ECT to weekly does not maintain the benefit.  But they didn't want to continue it.   [name] has  talked with ECT doc about holding off on ECT and missed dose but she's gotten more depressed.   Did twice weekly for awhle and it was beneficial.  Sched for Tuesday Plan: Start Trintellix off label for dep.  Trying to find solution that would allow stopping ECT Recommend continue ECT  but increase to twice weekly as [facility] recommends  04/15/23 appt noted:  with nurse and H Reduced clonazepam to 0.25 mg AM and 0.5 mg  HS, Latuda 60, incr Trintellix 10 ECT once or twice weekly.  Took 13 day holiday with ECT and progressively downhill.  Worse if goes more than a week without ECT.   Some interest but not much initiative.  Read paper some.  Can initiate conversation with nurse with some recent details.  Still other memory problems frustrating to everyone.   Some memory px even before ECT but  worse. Sleep good.  Feels good today and appetite ok.   D been at the house lately.  D from [place] No excessive sleep.   She naps some easily in the day.  No sig changes with less clonazepam.  Minimal effect from counseling DT memory. No new concerns.   No SE.     ECT-[facility]    Flowsheet Row Clinical Support from 12/08/2022 in [facility]  MADRS Total Score 54      PHQ2-9    Flowsheet Row Office Visit from 01/14/2023 in [facility] [place], MD Office Visit from 10/11/2022 in Sharlet Sal[place], MD Office Visit from 08/17/2022 in Shar[facility] Sal[facility], MD Office Visit from 07/19/2022 in Sharlet Sal[facility], MD Office Visit from 03/24/2022 in [facility], MD  PHQ-2 Total Score 4 6 0 0 1  PHQ-9 Total Score 14 -- -- -- --      Flowsheet Row ED to Hosp-Admission (Discharged) from 07/01/2022 in [facility] Admission (Discharged) from 05/18/2022 in [place] [facility] Pre-Admission Testing 60 from 05/10/2022 in [facility]  C-SSRS RISK CATEGORY No Risk No Risk No Risk        Past Psychiatric Medication Trials:  Prozac 40 with lithium and Zyprexa 2.5 to 5 mg,  nortriptyline, paroxetine, Effexor 225 Wellbutrin 300 mirtazapine,  Others  Pramipexole 0.625 daily hallucinations  Rexulti EPS, Abilify 15, Vraylar 1.5 1 week,  Latuda 10 mg SE, Olanzapine 10 briefly Caplyta briefly no response   Lamotrigine level 7.5  Namenda,  lorazepam,  Clonazaam 0.5 BID  no response Spravato after 6 tx of 84 mg and 2 of 56 mg .  this list is not exhaustive  Review of Systems:  Review of Systems  Constitutional:  Positive for fatigue. Negative for appetite change.  Cardiovascular:  Negative for palpitations.  Gastrointestinal:  Positive for constipation. Negative for nausea.       Linzess and Mozantic managed   Musculoskeletal:  Positive for gait problem.  Neurological:  Positive for dizziness and weakness.  Negative for tremors.       Some balance issues  Psychiatric/Behavioral:  Positive for decreased concentration. Negative for agitation, behavioral problems, confusion, dysphoric mood, hallucinations, self-injury, sleep disturbance and suicidal ideas. The patient is nervous/anxious. The patient is not hyperactive.     Medications: I have reviewed the patient's current medications.  Current Outpatient Medications  Medication Sig Dispense Refill   amLODipine (NORVASC) 10 MG tablet Take 1 tablet (10 mg total) by mouth daily. 90 tablet 1   amoxicillin (AMOXIL) 500 MG capsule Take 3 capsules (1,500 mg total) by mouth 1 hour prior to dental work. 9 capsule 1   amoxicillin (AMOXIL) 500 MG capsule Take 1 capsule (500 mg total) by mouth 4 (four) times daily for 7 days 28 capsule 0   amoxicillin-clavulanate (AUGMENTIN) 875-125 MG tablet Take 1 tablet by mouth 2 (two) times daily. 14 tablet 0   aspirin 81 MG chewable tablet Chew 1 tablet (81 mg total) by mouth daily.     Calcium  Carbonate-Vitamin D (CALCIUM 600/VITAMIN D PO) Take 1 tablet by mouth 2 (two) times daily.     cetirizine (ZYRTEC) 10 MG tablet Take 10 mg by mouth as needed for allergies.     Cholecalciferol (VITAMIN D3) 50 MCG (2000 UT) TABS Take 2,000 Units by mouth in the morning.     clonazePAM (KLONOPIN) 0.5 MG tablet Take 2 tablets (1 mg total) by mouth 2 (two) times daily. 360 tablet 0   cloNIDine (CATAPRES) 0.1 MG tablet Take 1 tablet (0.1 mg total) by mouth as needed prior to ECT treatment 20 tablet 2   COVID-19 mRNA vaccine 2023-2024 (COMIRNATY) syringe Inject into the muscle. 0.3 mL 0   dexlansoprazole (DEXILANT) 60 MG capsule Take 1 capsule by mouth daily 90 capsule 3   fesoterodine (TOVIAZ) 8 MG TB24 tablet Take 1 tablet (8 mg total) by mouth daily. 90 tablet 3   glycerin adult 2 g suppository Use 1 suppository as needed if you have not had a bowel movement in  4 to 5 days 25 suppository 0   hydrochlorothiazide (HYDRODIURIL) 12.5 MG  tablet Take 1 tablet (12.5 mg total) by mouth daily. 90 tablet 1   influenza vaccine adjuvanted (FLUAD QUADRIVALENT) 0.5 ML injection Inject into the muscle. 0.5 mL 0   Krill Oil 500 MG CAPS Take 500 mg by mouth daily.     lactulose (CHRONULAC) 10 GM/15ML solution Take 30 mLs (20 g total) by mouth 3 (three) times daily between meals as needed for mild constipation. (Patient taking differently: Take 20 g by mouth daily as needed for mild constipation.) 473 mL 3   Lavender Oil 80 MG CAPS Take 160 mg by mouth at bedtime.     levothyroxine (SYNTHROID) 75 MCG tablet Take 1 tablet (75 mcg total) by mouth daily. 90 tablet 2   levothyroxine (SYNTHROID) 75 MCG tablet Take 1 tablet (75 mcg total) by mouth daily. 90 tablet 2   levothyroxine (SYNTHROID) 75 MCG tablet Take 1 tablet (75 mcg total) by mouth daily. 90 tablet 2   linaclotide (LINZESS) 72 MCG capsule Take 2 capsules (144 mcg total) by mouth daily before breakfast. 180 capsule 1   LORazepam (ATIVAN) 0.5 MG tablet TAKE 2 TABLETS (1 MG)  BY MOUTH EVERY NIGHT AT BEDTIME AND 1 TABLET EVERY 4 HOURS AS NEEDED FOR ANXIETY 270 tablet 0   losartan (COZAAR) 100 MG tablet Take 1 tablet (100 mg total) by mouth daily. 90 tablet 1   Lurasidone HCl 60 MG TABS Take 1 tablet (60 mg total) by mouth daily after supper. 90 tablet 0   magic mouthwash w/lidocaine SOLN Take 5 mLs by mouth 3 (three) times daily as needed for mouth pain. (Patient taking differently: Take 5 mLs by mouth as needed for mouth pain.) 120 mL 0   memantine (NAMENDA) 10 MG tablet Take 1 tablet (10 mg total) by mouth 2 (two) times daily. 180 tablet 1   metoprolol tartrate (LOPRESSOR) 25 MG tablet Take 1 tablet (25 mg total) by mouth as needed. 45 tablet 3   Multiple Vitamins-Minerals (MULTIVITAMIN WITH MINERALS) tablet Take 1 tablet by mouth daily.     Multiple Vitamins-Minerals (PRESERVISION AREDS 2 PO) Take 1 capsule by mouth in the morning and at bedtime.     naloxegol oxalate (MOVANTIK) 12.5 MG  TABS tablet Take 2 tablets (25 mg total) by mouth daily. (Patient taking differently: Take 25 mg by mouth daily. PRN) 60 tablet 3   neomycin-polymyxin b-dexamethasone (MAXITROL) 3.5-10000-0.1 OINT Apply thin line (  1 cm) of ointment to right eye twice daily for 1 week. 3.5 g 1   ofloxacin (OCUFLOX) 0.3 % ophthalmic solution Instill 1 drop into right eye four times daily for 7 days. 5 mL 1   ondansetron (ZOFRAN-ODT) 4 MG disintegrating tablet Place 1 tablet every 6 hours by translingual route as needed. (Patient taking differently: Take by mouth as needed.) 20 tablet 0   pneumococcal 20-valent conjugate vaccine (PREVNAR 20) 0.5 ML injection Inject into the muscle. 0.5 mL 0   polyethylene glycol (MIRALAX / GLYCOLAX) 17 g packet Take 17 g by mouth as needed for mild constipation.     RSV vaccine recomb adjuvanted (AREXVY) 120 MCG/0.5ML injection Inject into the muscle. 0.5 mL 0   sennosides-docusate sodium (SENOKOT-S) 8.6-50 MG tablet Take 2 tablets by mouth as needed.     thiamine (VITAMIN B-1) 100 MG tablet Take 1 tablet (100 mg total) by mouth daily. 100 tablet 0   valACYclovir (VALTREX) 1000 MG tablet Take 2 tablets by mouth at onset, then 2 tablets 12 hours later. (Patient taking differently: Take 2,000 mg by mouth as needed. PRn) 30 tablet 1   vitamin B-12 (CYANOCOBALAMIN) 1000 MCG tablet Take 1,000 mcg by mouth at bedtime.     vortioxetine HBr (TRINTELLIX) 10 MG TABS tablet Take 1 tablet (10 mg total) by mouth daily. 30 tablet 1   No current facility-administered medications for this visit.    Medication Side Effects: Other: tremor  stable.  No worse.  Allergies:  Allergies  Allergen Reactions   Propranolol Other (See Comments)    Low blood pressure. Bradycardia    Brexpiprazole Other (See Comments)    Aphasia and catatonia    Past Medical History:  Diagnosis Date   Anxiety    Arthritis    "hips, spine" (03/17/2018)   Bipolar II disorder (HCC)    CHF (congestive heart failure)  (HCC)    Chronic bronchitis (HCC)    Chronic lower back pain    Chronic right hip pain    CKD (chronic kidney disease), stage II    Coronary artery disease    stent x1   Esophagitis, erosive    GAD (generalized anxiety disorder)    GERD (gastroesophageal reflux disease)    Headache    "maybe monthly" (03/17/2018))   Heart murmur, systolic    History of adenomatous polyp of colon    08-04-2016  tubular adenoma   History of blood transfusion 12/2017   "related to vascular hematoma"   History of electroconvulsive therapy    at [facility]--  started 04-15-2015 to 11-17-2016  total greater than 40 times   History of hiatal hernia    Hyperlipidemia    Hypertension    Hypothyroidism    Internal carotid artery stenosis, bilateral    per last duplex 05-01-2014  bilateral ICA 40-59%   Major depression, chronic    ECT treatments extensive and multiple started 07/ 2016   Memory loss    "both short and long-term; needs frequent reminders to follow instrucitons" (05/16/2017)   Migraines    "none in years" (03/17/2018)   OSA (obstructive sleep apnea)    per study 06/ 2012 moderate OSA  ; "refuses to wear masks" (03/17/2018)   Osteoporosis    Pneumonia 07/29/2022   Poor historian    due to short term memory loss   Presence of permanent cardiac pacemaker 03/17/2018   Pulmonary nodule    monitored by pcp   S/P placement of cardiac pacemaker 03/17/18  ST Jude  03/18/2018   Short-term memory loss    Sick sinus syndrome (HCC)     Family History  Problem Relation Age of Onset   Heart attack Father 42       deceased   Hypertension Father    Heart disease Father    Dementia Brother    Parkinson's disease Brother    Heart disease Brother    Breast cancer Paternal Aunt        Age 37's   Breast cancer Paternal Grandmother        Age unknown   Colon cancer Neg Hx     Social History   Socioeconomic History   Marital status: Married    Spouse name: Dr. Victorino Dike   Number of children: 2    Years of education: Not on file   Highest education level: Not on file  Occupational History   Occupation: housewife    Employer: UNEMPLOYED  Tobacco Use   Smoking status: Former    Current packs/day: 0.00    Average packs/day: 2.0 packs/day for 15.0 years (30.0 ttl pk-yrs)    Types: Cigarettes    Start date: 01/13/1956    Quit date: 01/13/1971    Years since quitting: 52.2    Passive exposure: Never   Smokeless tobacco: Never  Vaping Use   Vaping status: Never Used  Substance and Sexual Activity   Alcohol use: Yes    Comment: occassional 1 x a week   Drug use: Never   Sexual activity: Not Currently    Comment: intercourse age 12, sexual partners less than 5  Other Topics Concern   Not on file  Social History Narrative   Right handed   Social Determinants of Health   Financial Resource Strain: Low Risk  (12/12/2022)   Received from [facility], [facility]   Overall Financial Resource Strain (CARDIA)    Difficulty of Paying Living Expenses: Not hard at all  Food Insecurity: No Food Insecurity (12/12/2022)   Received from [facility], [facility]   Hunger Vital Sign    Worried About Running Out of Food in the Last Year: Never true    Ran Out of Food in the Last Year: Never true  Transportation Needs: No Transportation Needs (12/12/2022)   Received from [facility], [facility]   [facility] - Transportation    In the past 12 months, has lack of transportation kept you from medical appointments or from getting medications?: No    Lack of Transportation (Non-Medical): No  Physical Activity: Not on file  Stress: Not on file  Social Connections: Not on file  Intimate Partner Violence: Not At Risk (07/06/2022)   Humiliation, Afraid, Rape, and Kick questionnaire    Fear of Current or Ex-Partner: No    Emotionally Abused: No    Physically Abused: No    Sexually Abused: No     Past Medical History, Surgical history, Social history, and Family history were reviewed and updated as appropriate.   Please see review of systems for further details on the patient's review from today.   Objective:   Physical Exam:  LMP  (LMP Unknown)   Physical Exam Constitutional:      General: She is not in acute distress.    Appearance: She is well-developed.  Musculoskeletal:        General: No deformity.  Neurological:     Mental Status: She is alert  and oriented to person, place, and time.     Motor: No tremor.     Coordination: Coordination abnormal.     Comments: Cane use  Psychiatric:        Attention and Perception: She is attentive.        Mood and Affect: Mood is anxious. Mood is not depressed. Affect is not labile, blunt or inappropriate.        Speech: Speech is not slurred.        Behavior: Behavior normal. Behavior is not slowed, withdrawn or combative.        Thought Content: Thought content is not delusional. Thought content does not include homicidal or suicidal ideation. Thought content does not include suicidal plan.        Cognition and Memory: Cognition is impaired. She exhibits impaired recent memory.        Judgment: Judgment normal.     Comments: Insight fair to good. No auditory or visual hallucinations.  Depression and anxiety are remarkably improved but not resolved. Delusional guilt not elicited Affect pleasant and smiling . Couldn't remember this doc.       Lab Review:     Component Value Date/Time   NA 138 01/14/2023 1242   NA 137 12/29/2022 0000   K 4.0 01/14/2023 1242   CL 103 01/14/2023 1242   CO2 23 01/14/2023 1242   GLUCOSE 87 01/14/2023 1242   BUN 19 01/14/2023 1242   BUN 12 12/29/2022 0000   CREATININE 0.68 01/14/2023 1242   CALCIUM 8.4 (L) 01/14/2023 1242   PROT 6.9 11/11/2022 1106   PROT 6.2 04/20/2017 1151   ALBUMIN 3.9 09/02/2022 1221   ALBUMIN 3.9 04/20/2017 1151   AST 44 (H) 11/11/2022 1106   ALT 86 (H)  11/11/2022 1106   ALKPHOS 64 09/02/2022 1221   BILITOT 0.2 11/11/2022 1106   BILITOT <0.2 04/20/2017 1151   GFRNONAA >60 07/08/2022 0429   GFRNONAA 50 (L) 07/10/2020 1021   GFRAA 58 (L) 07/10/2020 1021       Component Value Date/Time   WBC 6.3 12/29/2022 0000   WBC 6.6 11/11/2022 1106   RBC 3.88 11/11/2022 1106   HGB 10.3 (A) 12/29/2022 0000   HGB 10.2 (L) 04/20/2017 1151   HCT 32 (A) 12/29/2022 0000   HCT 29.6 (L) 04/20/2017 1151   PLT 231 12/29/2022 0000   PLT 385 (H) 04/20/2017 1151   MCV 93.6 11/11/2022 1106   MCV 89 04/20/2017 1151   MCH 31.2 11/11/2022 1106   MCHC 33.3 11/11/2022 1106   RDW 13.7 11/11/2022 1106   RDW 13.9 04/20/2017 1151   LYMPHSABS 1,439 11/11/2022 1106   LYMPHSABS 1.5 04/20/2017 1151   MONOABS 0.7 07/07/2022 0458   EOSABS 112 11/11/2022 1106   EOSABS 0.3 04/20/2017 1151   BASOSABS 53 11/11/2022 1106   BASOSABS 0.0 04/20/2017 1151    Lithium Lvl  Date Value Ref Range Status  10/21/2022 0.5 (L) 0.6 - 1.2 mmol/L Final  10/21/2022 lithium level 0.5 on 300 mg nightly is stable.,  Nortriptyline 115 on 75 mg HS.  08/02/22 nortriptyline 85, lihtium 0.5  Nortriptyline level 51 on 50 mg nightly with paroxetine 20 mg daily  07/10/2020 lithium level 0.6 at [facility] office and nortriptyline level 130 on the current dosages of lithium 300 mg nightly and nortriptyline 75 mg nightly  02/20/2021 labs lithium 0.6 on 300 mg nightly.  Nortriptyline level 49 which is lower than expected on 75 mg nightly along with paroxetine 20  mg daily.    No results found for: "PHENYTOIN", "PHENOBARB", "VALPROATE", "CBMZ"   .res Assessment: Plan:    Severe bipolar I disorder, current or most recent episode depressed, with catatonia (HCC)  Generalized anxiety disorder  Mild cognitive impairment    History of lithium toxicity repeatedly while on chlorthalidone. No tremor px off of the lithium.    50-minute visit today.  Hx TX resistant bipolar depression with  catatonia.  Recent decompensation and rapidly getting worse with some cognive impairment and mild psychotic sx and altered mental status.Depression and anxiety much worse in the last several weeks.  Her case has been highly complex and treatment resistant at times.  No current catatonia but is somewhat anxious easily.    She had remarkedly improved with the ECT + change in psychiatric medicine to Latuda 60 mg daily with clonazepam 0.5 mg twice daily. She is not ruminative.  Affect is subdued.  She iis not as bad as before ECT.  Catatonia is resolved but she is anhedonic, asocial, less talkative.   She is still anxious and somewhat insecure.  Short-term memory impairment is consistent with what would be expected with the ECT at this time.  options of trials of Trintellix, Auvelity, etc.Reviewed the old chart and consider very low-dose Vraylar   Hospitalized at [facility] on psychiatric unit from 12/10/2022 to 01/03/2023 and received ECT and started Latuda 60 mg daily.  Stopped paroxetine, nortriptyline, and lithium. Not able to wean ECT yet  We discussed her long history of extremely severe treatment resistant major depression.  During the severe depression she has catatonia.  She had required longterm ECT.  A previous episode of depression approximately 10 years ago stayed in remission on Paxil and nortriptyline and lithium for a period of years until the lithium level was decreased due to tremor.  Now we are maintaining an adequate lithium level.  She has had several episodes of lithium toxicity with hospitalization.  This resolved once she stopped chlorthalidone.  From records it appears her last ECT was in September 2020.   Previously Disc Dr. Blossom Hoops concerns about longterm ECT on cognition.  Disc therapy for her  through Dr. Terrall Laity  3-4 times.  Didn't seem to help.  Seeing another person at [facility].  Trouble with memory interferes with some of the benefit.  For catatonia: try reducing  clonazepam to 0.25 mg AM and 0.375 mg HS for 2 weeks then 0.25 mg BID for cog reasons and not currently catatonic Continue Latuda 60 mg daily Continue lorazepam 0.5 mg as needed which is currently being used rarely. Incr Trintellix 15 for 2 weeks then 20 mg daily off label for dep.  Trying to find solution that would allow stopping ECT  Recommend continue ECT  but increase to twice weekly as [facility] recommends  Meredith Staggers MD, DFAPA Please see After Visit Summary for patient specific instructions.  Future Appointments  Date Time Provider Department Center  05/20/2023 10:30 AM Cottle, Steva Ready., MD [facility] None  07/04/2023  3:20 PM [facility] DEVICE REMOTES [facility]  10/03/2023  7:00 AM [facility] DEVICE REMOTES [facility]      No orders of the defined types were placed in this encounter.      -------------------------------

## 2023-04-16 ENCOUNTER — Other Ambulatory Visit (HOSPITAL_BASED_OUTPATIENT_CLINIC_OR_DEPARTMENT_OTHER): Payer: Self-pay

## 2023-04-18 DIAGNOSIS — F419 Anxiety disorder, unspecified: Secondary | ICD-10-CM | POA: Diagnosis not present

## 2023-04-18 DIAGNOSIS — E039 Hypothyroidism, unspecified: Secondary | ICD-10-CM | POA: Diagnosis not present

## 2023-04-18 DIAGNOSIS — G4730 Sleep apnea, unspecified: Secondary | ICD-10-CM | POA: Diagnosis not present

## 2023-04-18 DIAGNOSIS — F3163 Bipolar disorder, current episode mixed, severe, without psychotic features: Secondary | ICD-10-CM | POA: Diagnosis not present

## 2023-04-18 DIAGNOSIS — I5032 Chronic diastolic (congestive) heart failure: Secondary | ICD-10-CM | POA: Diagnosis not present

## 2023-04-18 DIAGNOSIS — I2510 Atherosclerotic heart disease of native coronary artery without angina pectoris: Secondary | ICD-10-CM | POA: Diagnosis not present

## 2023-04-18 DIAGNOSIS — N189 Chronic kidney disease, unspecified: Secondary | ICD-10-CM | POA: Diagnosis not present

## 2023-04-20 ENCOUNTER — Other Ambulatory Visit (HOSPITAL_BASED_OUTPATIENT_CLINIC_OR_DEPARTMENT_OTHER): Payer: Self-pay

## 2023-04-20 ENCOUNTER — Other Ambulatory Visit: Payer: Self-pay

## 2023-04-21 DIAGNOSIS — G4730 Sleep apnea, unspecified: Secondary | ICD-10-CM | POA: Diagnosis not present

## 2023-04-21 DIAGNOSIS — F3130 Bipolar disorder, current episode depressed, mild or moderate severity, unspecified: Secondary | ICD-10-CM | POA: Diagnosis not present

## 2023-04-21 DIAGNOSIS — I5032 Chronic diastolic (congestive) heart failure: Secondary | ICD-10-CM | POA: Diagnosis not present

## 2023-04-21 DIAGNOSIS — E039 Hypothyroidism, unspecified: Secondary | ICD-10-CM | POA: Diagnosis not present

## 2023-04-21 DIAGNOSIS — I2510 Atherosclerotic heart disease of native coronary artery without angina pectoris: Secondary | ICD-10-CM | POA: Diagnosis not present

## 2023-04-26 ENCOUNTER — Other Ambulatory Visit (HOSPITAL_BASED_OUTPATIENT_CLINIC_OR_DEPARTMENT_OTHER): Payer: Self-pay

## 2023-04-26 DIAGNOSIS — K219 Gastro-esophageal reflux disease without esophagitis: Secondary | ICD-10-CM | POA: Diagnosis not present

## 2023-04-26 DIAGNOSIS — G4730 Sleep apnea, unspecified: Secondary | ICD-10-CM | POA: Diagnosis not present

## 2023-04-26 DIAGNOSIS — Z9989 Dependence on other enabling machines and devices: Secondary | ICD-10-CM | POA: Diagnosis not present

## 2023-04-26 DIAGNOSIS — E039 Hypothyroidism, unspecified: Secondary | ICD-10-CM | POA: Diagnosis not present

## 2023-04-26 DIAGNOSIS — I2510 Atherosclerotic heart disease of native coronary artery without angina pectoris: Secondary | ICD-10-CM | POA: Diagnosis not present

## 2023-04-26 DIAGNOSIS — N189 Chronic kidney disease, unspecified: Secondary | ICD-10-CM | POA: Diagnosis not present

## 2023-04-26 DIAGNOSIS — F419 Anxiety disorder, unspecified: Secondary | ICD-10-CM | POA: Diagnosis not present

## 2023-04-26 DIAGNOSIS — I5030 Unspecified diastolic (congestive) heart failure: Secondary | ICD-10-CM | POA: Diagnosis not present

## 2023-04-26 DIAGNOSIS — F312 Bipolar disorder, current episode manic severe with psychotic features: Secondary | ICD-10-CM | POA: Diagnosis not present

## 2023-04-27 ENCOUNTER — Other Ambulatory Visit (HOSPITAL_BASED_OUTPATIENT_CLINIC_OR_DEPARTMENT_OTHER): Payer: Self-pay

## 2023-04-27 DIAGNOSIS — H353211 Exudative age-related macular degeneration, right eye, with active choroidal neovascularization: Secondary | ICD-10-CM | POA: Diagnosis not present

## 2023-04-28 ENCOUNTER — Other Ambulatory Visit (HOSPITAL_BASED_OUTPATIENT_CLINIC_OR_DEPARTMENT_OTHER): Payer: Self-pay

## 2023-04-29 DIAGNOSIS — F32A Depression, unspecified: Secondary | ICD-10-CM | POA: Diagnosis not present

## 2023-04-29 DIAGNOSIS — F319 Bipolar disorder, unspecified: Secondary | ICD-10-CM | POA: Diagnosis not present

## 2023-04-29 DIAGNOSIS — F314 Bipolar disorder, current episode depressed, severe, without psychotic features: Secondary | ICD-10-CM | POA: Diagnosis not present

## 2023-04-29 DIAGNOSIS — F312 Bipolar disorder, current episode manic severe with psychotic features: Secondary | ICD-10-CM | POA: Diagnosis not present

## 2023-05-03 DIAGNOSIS — N189 Chronic kidney disease, unspecified: Secondary | ICD-10-CM | POA: Diagnosis not present

## 2023-05-03 DIAGNOSIS — E039 Hypothyroidism, unspecified: Secondary | ICD-10-CM | POA: Diagnosis not present

## 2023-05-03 DIAGNOSIS — I2510 Atherosclerotic heart disease of native coronary artery without angina pectoris: Secondary | ICD-10-CM | POA: Diagnosis not present

## 2023-05-03 DIAGNOSIS — M549 Dorsalgia, unspecified: Secondary | ICD-10-CM | POA: Diagnosis not present

## 2023-05-03 DIAGNOSIS — F3130 Bipolar disorder, current episode depressed, mild or moderate severity, unspecified: Secondary | ICD-10-CM | POA: Diagnosis not present

## 2023-05-03 DIAGNOSIS — K219 Gastro-esophageal reflux disease without esophagitis: Secondary | ICD-10-CM | POA: Diagnosis not present

## 2023-05-03 DIAGNOSIS — M25559 Pain in unspecified hip: Secondary | ICD-10-CM | POA: Diagnosis not present

## 2023-05-03 DIAGNOSIS — G8929 Other chronic pain: Secondary | ICD-10-CM | POA: Diagnosis not present

## 2023-05-03 DIAGNOSIS — I5032 Chronic diastolic (congestive) heart failure: Secondary | ICD-10-CM | POA: Diagnosis not present

## 2023-05-03 DIAGNOSIS — F419 Anxiety disorder, unspecified: Secondary | ICD-10-CM | POA: Diagnosis not present

## 2023-05-03 DIAGNOSIS — G4730 Sleep apnea, unspecified: Secondary | ICD-10-CM | POA: Diagnosis not present

## 2023-05-10 DIAGNOSIS — G4730 Sleep apnea, unspecified: Secondary | ICD-10-CM | POA: Diagnosis not present

## 2023-05-10 DIAGNOSIS — E039 Hypothyroidism, unspecified: Secondary | ICD-10-CM | POA: Diagnosis not present

## 2023-05-10 DIAGNOSIS — F312 Bipolar disorder, current episode manic severe with psychotic features: Secondary | ICD-10-CM | POA: Diagnosis not present

## 2023-05-10 DIAGNOSIS — F3130 Bipolar disorder, current episode depressed, mild or moderate severity, unspecified: Secondary | ICD-10-CM | POA: Diagnosis not present

## 2023-05-10 DIAGNOSIS — M549 Dorsalgia, unspecified: Secondary | ICD-10-CM | POA: Diagnosis not present

## 2023-05-10 DIAGNOSIS — Z950 Presence of cardiac pacemaker: Secondary | ICD-10-CM | POA: Diagnosis not present

## 2023-05-10 DIAGNOSIS — N189 Chronic kidney disease, unspecified: Secondary | ICD-10-CM | POA: Diagnosis not present

## 2023-05-10 DIAGNOSIS — I5032 Chronic diastolic (congestive) heart failure: Secondary | ICD-10-CM | POA: Diagnosis not present

## 2023-05-10 DIAGNOSIS — I2510 Atherosclerotic heart disease of native coronary artery without angina pectoris: Secondary | ICD-10-CM | POA: Diagnosis not present

## 2023-05-10 DIAGNOSIS — K219 Gastro-esophageal reflux disease without esophagitis: Secondary | ICD-10-CM | POA: Diagnosis not present

## 2023-05-10 DIAGNOSIS — F419 Anxiety disorder, unspecified: Secondary | ICD-10-CM | POA: Diagnosis not present

## 2023-05-10 DIAGNOSIS — M25559 Pain in unspecified hip: Secondary | ICD-10-CM | POA: Diagnosis not present

## 2023-05-16 ENCOUNTER — Other Ambulatory Visit (HOSPITAL_BASED_OUTPATIENT_CLINIC_OR_DEPARTMENT_OTHER): Payer: Self-pay

## 2023-05-20 ENCOUNTER — Encounter: Payer: Self-pay | Admitting: Psychiatry

## 2023-05-20 ENCOUNTER — Ambulatory Visit (INDEPENDENT_AMBULATORY_CARE_PROVIDER_SITE_OTHER): Payer: Medicare Other | Admitting: Psychiatry

## 2023-05-20 ENCOUNTER — Other Ambulatory Visit (HOSPITAL_BASED_OUTPATIENT_CLINIC_OR_DEPARTMENT_OTHER): Payer: Self-pay

## 2023-05-20 ENCOUNTER — Other Ambulatory Visit: Payer: Self-pay

## 2023-05-20 DIAGNOSIS — F411 Generalized anxiety disorder: Secondary | ICD-10-CM

## 2023-05-20 DIAGNOSIS — G250 Essential tremor: Secondary | ICD-10-CM | POA: Diagnosis not present

## 2023-05-20 DIAGNOSIS — R7989 Other specified abnormal findings of blood chemistry: Secondary | ICD-10-CM

## 2023-05-20 DIAGNOSIS — F061 Catatonic disorder due to known physiological condition: Secondary | ICD-10-CM

## 2023-05-20 DIAGNOSIS — F314 Bipolar disorder, current episode depressed, severe, without psychotic features: Secondary | ICD-10-CM | POA: Diagnosis not present

## 2023-05-20 DIAGNOSIS — G3184 Mild cognitive impairment, so stated: Secondary | ICD-10-CM

## 2023-05-20 DIAGNOSIS — F5105 Insomnia due to other mental disorder: Secondary | ICD-10-CM | POA: Diagnosis not present

## 2023-05-20 MED ORDER — LURASIDONE HCL 60 MG PO TABS
1.0000 | ORAL_TABLET | Freq: Every day | ORAL | 0 refills | Status: DC
  Filled 2023-05-20 (×2): qty 30, 30d supply, fill #0
  Filled 2023-06-08 – 2023-06-21 (×2): qty 30, 30d supply, fill #1
  Filled 2023-07-19: qty 30, 30d supply, fill #2

## 2023-05-20 MED ORDER — VORTIOXETINE HBR 20 MG PO TABS
20.0000 mg | ORAL_TABLET | Freq: Every day | ORAL | 1 refills | Status: DC
  Filled 2023-05-20: qty 30, 30d supply, fill #0
  Filled 2023-06-08 – 2023-06-13 (×2): qty 30, 30d supply, fill #1

## 2023-05-20 MED ORDER — MIRTAZAPINE 7.5 MG PO TABS
7.5000 mg | ORAL_TABLET | Freq: Every day | ORAL | 1 refills | Status: AC
Start: 2023-05-20 — End: ?
  Filled 2023-05-20: qty 30, 30d supply, fill #0
  Filled 2023-06-13: qty 30, 30d supply, fill #1

## 2023-05-20 NOTE — Progress Notes (Unsigned)
Meghan Oliver [id number] 1941-08-26 82 y.o.  Subjective:   Patient ID:  Meghan Oliver is a 82 y.o. (DOB 11/05/1940) female. Patient was last seen August 21, 2018 Chief Complaint:  Chief Complaint  Patient presents with  . Follow-up  . Depression  . Anxiety  . Fatigue     [name] presents to the office today for follow-up of Severe TR bipolar depression with psychotic features including catatonia.  11/30/2018 was the last visit and the following was noted: Had ECT yesterday at [facility].  Last was Jan 8 and was doing well then.  Next scheduled April 15.  Had a lithium level from there which is pending. Pretty good until the last 10 days to 2 weeks with less energy and motivation.  Bothered by old sick dog.  Worried about how that will affect her.  Has slept with the dog.  Enjoyed [place].  Going to gym and playing [place].  Took the class and didn't feel she caught on quickly. Wonders if it is bc of the ECT memory effects.  Bridge is harder and not playing.  Planning to return to [facility] mid April.   spent the winter in [place] per usual..  No significant depression since here. ECT frequency about 6 weeks.  Gets very anxious the day of the ECT.  ECT consistently for a year.  Best in mood in 7-8 years.  Also the pacemaker helped.  Friends notice the benefit.  Plan:  Option increase in the nortriptyline if needed.  Considered this.  They agree and would like to increase nortriptyline to 75 to try to reduce tendency to depression before the ECT.   03/19/19  Addendum: Here the contents from an email from the nurse for Ms. Hartland  Hello [name],   I hope you and your family are healthy and well. I took Jalin to [facility] for ECT and here are labs from 01/26/19. I am going to also forward them to [facility] for follow up with her LFT's. They are still higher than I would like.  Her lithium level was 0.51 on a daily dose of 300mg  qohs x 4 nights- alternating with 450mg  qohs x 3 nights. She is not  demonstrating any signs of elevated levels, minimal hand tremors, less anxiety and restlessness since her ECT treatment.  Her Nortryptyline level was 140 on 75mg  qhs.  I would like to continue her current dosages of both the above meds and recheck her lithium level at her next scheduled ECT of 03/23/19.  Please let me know of any changes you would like to make. Take care, Meghan Oliver Lithium level was 0.4 on the 450mg  alt with 300 mg QOD. No changes were made in meds.  11/15/2019 phone call: Telephone call from patient's husband [name] on 11/14/2019  Patient is scheduled for ECT soon for maintenance ECT.  He thinks it is been 2-1/2 months since the last ECT but he is going to check the date for sure.  It is still be done being done at [facility].  He is wondering about skipping this treatment because the patient has continued to be free of depression and seems cognitively clearer as these ECT treatments have been spread out further from each other.  Patient remains on medications as prescribed.  If it is truly been over 2 months since the last ECT then it is reasonable to consider discontinuing ECT.  It is unlikely that ECT with the frequency of greater than  2 months is significantly helpful at preventing her relapse.  If however the ECT frequency is less than every 2 months it could still be helping to prevent recurrence.  He indicated he would consider this information and discuss it with her ECT team and make a decision.  They are heading to [place] in the next few weeks.  Needs to schedule an appointment with me for follow-up because it is been many months.  He agrees.   02/19/2020 appt, the following noted: Moving to [facility] in a few months.  Ready to downsize.   Stopped ECT as discussed and has not been more deprressed.   Walks dog 4 times daily.   Lithium 0.7 on  01/30/20 on lithium 300 mg plus 150 mg on M, W, F Nortriptyline 70 on 75 mg HS. No SE except tremor.  Balance is not  great.  Very happy.   No depression since here.  No mood swings.  Sleep and appetite is OK.  No med changes:  04/03/2020 TC with the following noted: Patient's husband called stating that her tremor was a little worse and she was having some stutter. No evidence of stroke otherwise. First thing would be to check serum lithium level and BMP.  Order written.  Have asked her husband to let us know about the lab to which it should be sent.  04/22/20 TC witht he following noted: Lithium level is not dangerously high but it is has crept up to 1.0 which is higher than desired for her.  Our goal for her is 0.5-0.7.  At the current level it is likely causing side effects so we should reduce the dosage from lithium 300 mg plus 150 mg on M, W, F  TO 300 mg daily.  Meaning drop off the 150 mg capsule.  It will take about 2 weeks for the level to gradually come down to the desired level.  05/08/2020 appt with the following noted: Seen with H and [name] nurse. Moving to [facility] and feels OK about it.  I think I'll be fine.   Pretty good with balance.  Doing yoga and it helps. [name] has cancer. Old dog 87 & 1/2 yo still living. [name]'s concerns when went to [place] for D's BD she had difficulty time with tremor lethargy, more confusion.  Better when go back home. [name] notes using more lorazepam. At times gait is unsteady. Not significantly depressed.  Can be anxious at times.  Eating okay.  Sleeping okay.  Is easily confused under stress. Plan: reduce lithium to 300 mg nightly.  07/14/20 appt with the following noted: Seen with her husband as well as her family nurse. Feeling fine and pleased with that.   Some anxiety over the move to smaller place. [name]'s twin also having heart problems Gene. [name]'s kids came and visited.    Plan: Continue nortriptyline 75 mg, paroxetine 20 mg, lithium 300 mg, Namenda 10 mg twice daily, lorazepam 0.5 mg 1/2-1 3 times daily as needed, mirtazapine 7.5 mg  nightly  02/27/2021 appointment with the following noted:  Seen with H and nurse [name] Received email from nurse Meghan Loft on 02/24/2021 indicated that he had moved into [facility] living in May.  Jaziel has gradually become more depressed and more anxious using lorazepam as prescribed 3 times daily.  More memory issues.  She has remained engaged and initiating appropriate conversation.  She is very worried about her 25 year old dog who is in poor health and fears she will  become more depressed when the dog dies.  She and her husband do not wish to prefer to pursue further ECT unless absolutely necessary. Likes [facility] so far.   No depression by her report.  Sold big house in Feb.  It worked out well.   Worries about her dog 5 yo will die soon. H agrees she's done well with depression.   Enjoys things and has interests. Plan: Continue Lithium 300 mg daily  Continue nortriptyline 75 mg daily Continue paroxetine 20 mg daily Continue lorazepam 0.5 mg 3 times daily for both anxiety and catatonia Continue vitamin B6 and primidone for tremor Cerefolin NAC 2 daily.  05/13/2021 appointment with the following noted: Happy with [facility].  H and she agree is doing well with depression.[place] socialization. She complains of memory.   Family visited and getting along with Dondra Spry better.  H says Dondra Spry is acting better.  Goes to dinner and activities together now.  Better relationship with Gene.. Tremor has been OK.   Ativan usually 0.25 mg TID and tolerated. Anxiety managed with this generally. Molly dog health more stable. To [place] before T[place]giving until April.   Plan: Continue Lithium 300 mg daily  Continue nortriptyline 75 mg daily Continue paroxetine 20 mg daily Continue lorazepam 0.5 mg 3 times daily for both anxiety and catatonia Continue vitamin B6 and primidone for tremor   08/14/2021 appointment with the following noted: Seen with her husband [name] and nurse [name]. Everyone reports that  her mood has been stable.  Her anxiety is manageable with as needed lorazepam.  She is still happy with the transition to [facility].  They are preparing to go to [place] for the winter per usual but may sell their house father there. Tolerating meds without any unusual side effects. Is dealing with grief over the loss of her dog Molly.  03/10/2022 appt noted:  seen with H and nurse Oleta Mouse [facility] house. [name] not good phsyically hard to play golf.  Been really hard. He'll be 85 in July.  Some stress scheduling with D's.   60 th wedding necessary this month.   Mood up and down.  Sister in law across the street can be difficult and demeaning.  She feels unsettled.  Some worry. Some back pain limiting activity.   Sleep is ok except with pain awakening.   No SE, except tremor some worse about 3-5 and helped by lorazepam 0.25 mg then No greater than 1 mg lorazepam daily Patient denies any recent difficulty with anxiety except as noted.  Patient denies difficulty with sleep initiation or maintenance. Denies appetite disturbance.    Patient has some difficulty with concentration.  Patient denies any suicidal ideation.  Usually takes Ativan at night and sleeps well 8 hours.   Plan no med changes  10/23/213 appt noted: Still doing well. Pneumonia and hosp for 6 days 3 weeks ago.  Viral.  Improving at this time. Increased tremor gradually in 3-4 mos helped by lorazepam avg 1.00-1.25 mg daily. No change in primidone. Total R hip August 2023 and done well.  Finishing home PT.   Appetite poor for 7-8 mos.  2 protein drinks daily but may not eat more than 1 good meal a day. [facility]. Substantial memory problems in hospital and in unusual enviornments. Nurse notices decline.  B with LBD passed in August. No pain meds or muscle relaxants beyond pain. No depression.  H note sometimes she has less motivation than others. [name] notes some normal waxing and  waning of mood. Good social interaction.   Not  spending winter in [facility] this year.  D still lives there. Plan: Increase mirtazapine 15 mg HS for appetite.  Also used for sleep and depression.  09/23/22 appt noted:  with nurse and [name] [name] feeling bad with more pain and fatigue.  [name] has CHF.   Made Ericka sad.  Lost a cgood friends.  Rough time.  Mostly worry about [name].  She feels afraid to be alone if something happens.   Not sig depressed.    Just worry . H can't be as active.  Taking a shower is hard.   She's back exercising again.   Went to [employer] for Thanksgiving in [facility] and plan to go back for Christmas. It's hard to travel. Told nurse she was depressed. Appetite is better and once asleep is OK. Using more lorazepam for anxiety 3-4 daily. Plan: Continue Lithium 300 mg daily  Continue nortriptyline 75 mg daily Continue paroxetine 20 mg daily Continue lorazepam 0.5 mg 3 times daily for both anxiety and catatonia Continue vitamin B6 and primidone for tremor Increased and it helped mirtazapine 15 mg HS for appetite.  Also used for sleep and depression.  10/19/22  RTC  [name] reports not doing well.  Went to [facility].  Recent pneumonia.  They are thinking this is aspiration pneurmonia. Lethargic and anxious with tremor. Ruminating.  Hot to cold .  Low tolerance.  Going downhill quickly.  She's verbalizing depression and desire to isolate.  Won't go out.  Started within the last 2 weeks. Usually doesn't complain when she's sick.   [name]'s biggest concern is that Doreatha Martin is having trouble handling this bc she's up at night rambling and not really safe to be up at night.    Will see her 10/22/22 Get lithium and nortriptyline levels.   Meredith Staggers, MD, [facility]     10/22/22 emergency appt with nurse [name] and H [name]: Pneumonia Oct and then again 2nd week January.  It appears to have largely resoved.  It's really bad.  Nausea. Anxious bc [name] ill.  Doesn't want him to leave.  [name] sees anxiety triggering anxiety with little to no appetite.  Hypersensitive to  stimuli like light and noises.  Doesn't want TV on.  [name] says totally reversed in last 6 weeks.    [name] reports she's overemotional.  Friends died. Losing wt 91.4# today. More trouble with sleep. Lorazepam increased to 2 mg daily.  Not sleepy with it.  Doesn't seem to be helpful. Last week neglect hygiene but better this week a little. [name] says STM is poor at times and at times disoriented if in [place].  Will walk in bathroom and wonder where sh is. Barium swallow pending for evaluation of refulex.    10/26/22 RTC:  RTC   No improvement since Friday.  Gradually less engaged.  Can be overstimulated with visitors.   Disc nortriptyline level 115 is a little higher than it was and higher levels can cause SE in elderly.   Reduce nortriptyline to 50 mg HS. Taking lorazepam    Consider low dose of Vraylar or pramipexole.  Or Spravato.     Meredith Staggers, MD, [facility]     11/15/22 appt noted: Dialogue with Dr. Shirlee Latch cardiologist agreed pt should be stable enough from CV perspective to receive Spravato which could elevate BP.  If that occurs use prn metoprolol 25 mg. Disc in detail with Dr. Corinda Gubler and he agrees Glad[place] Sink is appropriate though not  FDA approved for bipolar depression.  Disc 2 metanalysis in support of this and this alernative is safer and likely better tolerated than the option of ECT and faster than options of trials of Trintellix, Auvelity, Latuda etc. Tolerating meds.  She is depressed and very anxious and intermittently confused.  Taking clothes on and off bc rapid changes in comfort.  Worried over [employer].  Sleep disrupted appetite some better with mirtazapine. Received Spravato 56 mg first time today and was dissociated.  Resolved over 2 hours.  No HA, NV.  Was distressed moderately and very ambivalent about Tx.  H and nurse supportive of tx plan and for the pt.    11/24/22 appt noted: Current meds: nortriptyline 50 mg HS, paroxetine 20 mg daily, lithium 300 mg nightly, mirtazapine  30 mg nightly, memantine 10 mg twice daily.  Lorazepam 0.5 mg every 4 to 6 hours as needed catatonia or severe anxiety. Received first dose of Spravato 84 mg today.  She was more noticeably dissociated than with the 56 mg dose.  It did resolve over the 2-hour course of observation although she remained somewhat unsteady and needed to leave via wheelchair.  She did not have headache nausea or vomiting.  She remains ambivalent about the treatment. Husband notes she has continued to be anxious and depressed and was extremely anxious yesterday and somewhat confused.  At times would say things that did not make sense.  He remained supportive of the treatment while she remains ambivalent about the treatment.  Her appetite is good and she is eating well.  Sleep is variable.  H reported at times will be almost normal after receiving an extra dose of lorazepam 0.5 mg prn but this only lasts an hour or so. Plan: Continue Lithium 300 mg daily  Continue nortriptyline 50 mg daily Continue paroxetine 20 mg daily Continue vitamin B6 and primidone for tremor Imirtazapine 30 mg HS for appetite,  used for sleep and depression.  Appetite better Switch lorazepam to [facility] 2 mg AM and use lorazepam 0.5 mg prn.  For catatonia in hopes of more consistent relief.  Decision based on observation of periods of almost normalcy after some BZ doses.  We discussed potential switch of paroxetine and nortriptyline to Auvelity or Trintellix or possibly Jordan.  However these switches will be difficult and it is hoped that the Spravato may provide enough improvement to allow a switch in medicine more tolerable.  11/26/22 appt noted: Current meds: nortriptyline 50 mg HS, paroxetine 20 mg daily, lithium 300 mg nightly, mirtazapine 30 mg nightly, memantine 10 mg twice daily.  Lorazepam 0.5 mg every 4 to 6 hours as needed catatonia or severe anxiety.  Started Loreev 2 mg daily over weekend Received Spravato 84 mg today.   Spravato with  expected dissociation.  She didn't experience relief from negative emotion.  Dissociation resolved over observation period.  No N, V, HA.   Still high anxiety and dread of most everything.  Depressed with negative thought predominates.  H notes periods of confusion and then maybe an hour of clarity sometimes after the dosing of lorazepam.  Then back to severe sx.  Eating is fair. No SI.  Fearful generally.  Needs help with ADLs.  Cannot take meds on her own. Tolerating meds without SE No noticeable effect from Loreev in terms of benefit or SE  12/01/22 appt noted: Received a phone call from nurse [name] this morning to call as soon as possible this patient very agitated and  confused.  Thrashing around in bed.  At times talking about dying out of the fear that she was going to die.  Highly anxious.  Not very cooperative with drinking water or taking medicines this morning. Discussed the situation with her husband as well and we have to options: 1's if we can get her to the office to receive Spravato is scheduled today that may provide some immediate relief and forestall and may be prevent hospitalization.  The second option is to proceed directly to hospitalization.  If she is unable to take nutrition and hydration hospitalization will be necessary. Discussed that appeared to be catatonic hyperactivity and agitation which has not responded to the extended release lorazepam 2 mg a day.  Therefore she was given 1 mg of Ativan this morning and did calm down enough to make it to the Spravato administration this afternoon.  She was able to cooperate with Spravato administration and tolerated it well with typical levels of dissociation which gradually resolved over the 2 hours.  She was able to eat and drink fluids prior to coming to the appointment. Current meds: nortriptyline 50 mg HS, paroxetine 20 mg daily, lithium 300 mg nightly,  memantine 10 mg twice daily.  Lorazepam 0.5 mg every 4 to 6 hours as needed  catatonia or severe anxiety.  Started Loreev 2 mg daily over weekend olanazpine 2.5 mg HS Received Spravato 84 mg today.   Husband notes that after Spravato administration on Monday she was much improved for a period of several hours and was able to have conversations with her family members and make decisions about distributing some of her jewelry and other family decisions.  However over Monday evening into Tuesday she became more agitated and confused and anxious again.  There have been no obvious effects of taking the olanzapine but it does not appear that long-acting lorazepam 2 mg as provided any significant relief. Plan: Continue Lithium 300 mg daily  Continue nortriptyline 50 mg daily Continue paroxetine 20 mg daily Continue vitamin B6 and primidone for tremor DC mirtazapine For catatonia: lorazepam0.5 mg q 4 hour mg daily as tolerated for catatonia with dose spread through the day..  Nurse manages. Restart Loreeve 2 mg daily and use prn lorazepam for catatonia Increase olanzapine 5 mg HS for TRD, anxiety, catatonia, delusions of persecution  12/06/22 appt noted; Psych meds: as above except increase Loreev to 2 mg BID for severe catatonia. nortriptyline 50 mg HS, paroxetine  20 mg daily, lithium 300 mg nightly,  memantine 10 mg twice daily.  Lorazepam 0.5 mg every 4 to 6 hours as needed catatonia or severe anxiety.  Started Loreev 2 mg increased BID for severe catatonia,  increased olanazpine 5 mg HS for 3 days. Received 5th dose of Spravato 84 mg today.    She was able to cooperate with Spravato administration and tolerated it well with typical levels of dissociation which gradually resolved over the 2 hours.  She was able to eat and drink fluids prior to coming to the appointment.   She still feels confused and depressed and anxious. Per family had periods over the weekend where she was confused thinking her H was dead even though he was right in front of her.  Also thinking she is going to  die and not get better.  Is eating and drinking fluids well.  Has needed extra lorazepam to sleep.  Limited periods of lucidity per H. Tolerating high dose lorazepam but limited benefit for catatonia. Plan: Continue Lithium  300 mg daily  reduce nortriptyline 25 mg daily in anticipation of change Reduce paroxetine 10 mg daily in anticipation of change Continue vitamin B6 and primidone for tremor For catatonia: lorazepam0.5 mg q 4 hour mg daily as tolerated for catatonia with dose spread through the day..  Nurse manages. DT severity of catatonic hyperactivy Loreeve 2 mg BID and use prn lorazepam for catatonia Continue olanzapine 5 mg HS for TRD, anxiety, catatonia, delusions of persecution  12/08/22 appt noted: Psych meds:  Loreev to 2 mg BID for severe catatonia. nortriptyline 25 mg HS, paroxetine 10 mg daily, lithium 300 mg nightly,  memantine 10 mg twice daily.  Lorazepam 0.5 mg every 4 to 6 hours as needed catatonia or severe anxiety.  Started Loreev 2 mg increased BID for severe catatonia,  increased olanazpine 5 mg HS for 3 days. Received 6th dose of Spravato 84 mg today.    She was able to cooperate with Spravato administration and tolerated it well with typical levels of dissociation which gradually resolved over the 2 hours.  She was able to eat and drink fluids prior to coming to the appointment.   She still feels confused and depressed and anxious. Hopeless.  Thoughts she is going to die. Per nurse and H no sig improvement noted with spravato.   No current alcohol problems  01/14/2023 appointment on an urgent basis: Hospitalized due to psychiatry at [facility] from 12/10/2022 until 01/03/2023.  Hospitalized for bipolar disorder severe depression with catatonia.  Received ECT and Latuda 60 mg daily was started.  Stopped paroxetine, lithium, nortriptyline, and mirtazapine.  Lorazepam was switched to clonazepam. Current psych meds clonazepam 0.5 mg twice daily, Latuda 60 mg daily.  Memantine 10  mg twice daily, weekly ECT rec Wt 101# and better appetite.  Sleep not great but ok.  Will lie awake some.  H says it's pretty good. Alert in the day.  No SE with meds.  Tremor better.   Lorazepam only on ECT days. Plan:  For catatonia: Continue clonazepam 0.5 mg twice daily also for anxiety Continue Latuda 60 mg daily Continue lorazepam 0.5 mg as needed which is currently being used rarely. Recommend continue ECT  weekly as [facility] recommends  02/22/23    [employer] nurse called at 11am.  Adanelly is doing better, receiving ECT twice this week. Not having breakthrough anxiety now and [name] wants to decrease her morning dose of Klonopin to help decrease her sedation. She wants to give her 1/2 of 0.5mg  Klonopin in the am and just give her regular dose the other two times a day. Is this okay.        Electronically signed by Soundra Pilon L at 02/22/2023 11:21 AM  03/11/23 appt:  Current psych med:  clonazepam 0.25 mg AM and 0.5 mg HS on 5/21, Latuda 60,  Worry anxiety.  Not as happy as those around here. Reduced ECT to weekly does not maintain the benefit.  But they didn't want to continue it.   [name] has  talked with ECT doc about holding off on ECT and missed dose but she's gotten more depressed.   Did twice weekly for awhle and it was beneficial.  Sched for Tuesday Plan: Start Trintellix off label for dep.  Trying to find solution that would allow stopping ECT Recommend continue ECT  but increase to twice weekly as [facility] recommends  04/15/23 appt noted:  with nurse and H Reduced clonazepam to 0.25 mg AM and 0.5 mg HS,  Latuda 60, incr Trintellix 10 ECT once or twice weekly.  Took 13 day holiday with ECT and progressively downhill.  Worse if goes more than a week without ECT.   Some interest but not much initiative.  Read paper some.  Can initiate conversation with nurse with some recent details.  Still other memory problems frustrating to everyone.   Some memory px even before ECT but  worse. Sleep good.  Feels good today and appetite ok.   D been at the house lately.  D from [place] No excessive sleep.   She naps some easily in the day.  No sig changes with less clonazepam.  Minimal effect from counseling DT memory. No new concerns.   No SE.    05/20/23 appt noted: Tolerated increase in Trintellix to 15 mg daily.  Reduced clonazepam 0.25 mg AM and 0.375 mg HS. No prn lorazepam.  No dep verbalized since here.  Trouble with sleep.  Intial insomnia.  No caffeine.  Some napping.  Not drowsy now.  Will lay on couch after breakfast.   Not anxious about anything.  Sleep trouble in the last few days. Last ECT a little over 2 weeks to wait another week if possible.  H thinks she is more awake with less ECT.  Will spontaneously seek out paper and fix several. Where do we go from here?   Per H .  Wants her to be able to stop ECT. Nurse notes better exec function.  Nurse has seen some lack of motivation and little spontaneous speech.  Won't engage in conversation much per family.   Still some rumination.        ECT-MADRS    Flowsheet Row Clinical Support from 12/08/2022 in [facility]  MADRS Total Score 54      PHQ2-9    Flowsheet Row Office Visit from 01/14/2023 in [facility], MD Office Visit from 10/11/2022 in [facility], MD Office Visit from 08/17/2022 in [facility], MD Office Visit from 07/19/2022 in [facility], MD Office Visit from 03/24/2022 in [facility], MD  PHQ-2 Total Score 4 6 0 0 1  PHQ-9 Total Score 14 -- -- -- --      Flowsheet Row ED to Hosp-Admission (Discharged) from 07/01/2022 in [facility] Admission (Discharged) from 05/18/2022 in [facility] Pre-Admission Testing 60 from 05/10/2022 in [facility]  C-SSRS RISK CATEGORY No Risk No Risk No Risk        Past Psychiatric Medication Trials:  Prozac 40 with lithium and Zyprexa 2.5  to 5 mg,  nortriptyline, paroxetine, Effexor 225 Wellbutrin 300 mirtazapine,  Others  Pramipexole 0.625 daily hallucinations  Rexulti EPS, Abilify 15, Vraylar 1.5 1 week,  Latuda 10 mg SE, Olanzapine 10 briefly Caplyta briefly no response   Lamotrigine level 7.5  Namenda,  lorazepam,  Clonazaam 0.5 BID  no response Spravato after 6 tx of 84 mg and 2 of 56 mg .  this list is not exhaustive  Review of Systems:  Review of Systems  Constitutional:  Positive for fatigue. Negative for appetite change.  Cardiovascular:  Negative for palpitations.  Gastrointestinal:  Positive for constipation. Negative for nausea.       Linzess and Mozantic managed   Musculoskeletal:  Positive for gait problem.  Neurological:  Positive for dizziness and weakness. Negative for tremors.       Some balance issues  Psychiatric/Behavioral:  Positive for  decreased concentration. Negative for agitation, behavioral problems, confusion, dysphoric mood, hallucinations, self-injury, sleep disturbance and suicidal ideas. The patient is nervous/anxious. The patient is not hyperactive.     Medications: I have reviewed the patient's current medications.  Current Outpatient Medications  Medication Sig Dispense Refill  . amLODipine (NORVASC) 10 MG tablet Take 1 tablet (10 mg total) by mouth daily. 90 tablet 1  . aspirin 81 MG chewable tablet Chew 1 tablet (81 mg total) by mouth daily.    . Calcium Carbonate-Vitamin D (CALCIUM 600/VITAMIN D PO) Take 1 tablet by mouth 2 (two) times daily.    . cetirizine (ZYRTEC) 10 MG tablet Take 10 mg by mouth as needed for allergies.    . Cholecalciferol (VITAMIN D3) 50 MCG (2000 UT) TABS Take 2,000 Units by mouth in the morning.    . clonazePAM (KLONOPIN) 0.5 MG tablet Take 2 tablets (1 mg total) by mouth 2 (two) times daily. 360 tablet 0  . cloNIDine (CATAPRES) 0.1 MG tablet Take 1 tablet (0.1 mg total) by mouth as needed prior to ECT treatment 20 tablet 2  . COVID-19 mRNA  vaccine 2023-2024 (COMIRNATY) syringe Inject into the muscle. 0.3 mL 0  . dexlansoprazole (DEXILANT) 60 MG capsule Take 1 capsule by mouth daily 90 capsule 3  . fesoterodine (TOVIAZ) 8 MG TB24 tablet Take 1 tablet (8 mg total) by mouth daily. 90 tablet 3  . glycerin adult 2 g suppository Use 1 suppository as needed if you have not had a bowel movement in  4 to 5 days 25 suppository 0  . hydrochlorothiazide (HYDRODIURIL) 12.5 MG tablet Take 1 tablet (12.5 mg total) by mouth daily. 90 tablet 1  . influenza vaccine adjuvanted (FLUAD QUADRIVALENT) 0.5 ML injection Inject into the muscle. 0.5 mL 0  . Krill Oil 500 MG CAPS Take 500 mg by mouth daily.    Marland Kitchen lactulose (CHRONULAC) 10 GM/15ML solution Take 30 mLs (20 g total) by mouth 3 (three) times daily between meals as needed for mild constipation. (Patient taking differently: Take 20 g by mouth daily as needed for mild constipation.) 473 mL 3  . Lavender Oil 80 MG CAPS Take 160 mg by mouth at bedtime.    Marland Kitchen levothyroxine (SYNTHROID) 75 MCG tablet Take 1 tablet (75 mcg total) by mouth daily. 90 tablet 2  . levothyroxine (SYNTHROID) 75 MCG tablet Take 1 tablet (75 mcg total) by mouth daily. 90 tablet 2  . levothyroxine (SYNTHROID) 75 MCG tablet Take 1 tablet (75 mcg total) by mouth daily. 90 tablet 2  . linaclotide (LINZESS) 72 MCG capsule Take 2 capsules (144 mcg total) by mouth daily before breakfast. 180 capsule 1  . LORazepam (ATIVAN) 0.5 MG tablet TAKE 2 TABLETS (1 MG)  BY MOUTH EVERY NIGHT AT BEDTIME AND 1 TABLET EVERY 4 HOURS AS NEEDED FOR ANXIETY 270 tablet 0  . losartan (COZAAR) 100 MG tablet Take 1 tablet (100 mg total) by mouth daily. 90 tablet 1  . Lurasidone HCl 60 MG TABS Take 1 tablet (60 mg total) by mouth daily after supper. 90 tablet 0  . magic mouthwash w/lidocaine SOLN Take 5 mLs by mouth 3 (three) times daily as needed for mouth pain. (Patient taking differently: Take 5 mLs by mouth as needed for mouth pain.) 120 mL 0  . memantine  (NAMENDA) 10 MG tablet Take 1 tablet (10 mg total) by mouth 2 (two) times daily. 180 tablet 1  . metoprolol tartrate (LOPRESSOR) 25 MG tablet Take 1 tablet (25  mg total) by mouth as needed. 45 tablet 3  . Multiple Vitamins-Minerals (MULTIVITAMIN WITH MINERALS) tablet Take 1 tablet by mouth daily.    . Multiple Vitamins-Minerals (PRESERVISION AREDS 2 PO) Take 1 capsule by mouth in the morning and at bedtime.    . naloxegol oxalate (MOVANTIK) 12.5 MG TABS tablet Take 2 tablets (25 mg total) by mouth daily. (Patient taking differently: Take 25 mg by mouth daily. PRN) 60 tablet 3  . neomycin-polymyxin b-dexamethasone (MAXITROL) 3.5-10000-0.1 OINT Apply thin line (1 cm) of ointment to right eye twice daily for 1 week. 3.5 g 1  . ofloxacin (OCUFLOX) 0.3 % ophthalmic solution Instill 1 drop into right eye four times daily for 7 days. 5 mL 1  . ondansetron (ZOFRAN-ODT) 4 MG disintegrating tablet Place 1 tablet every 6 hours by translingual route as needed. (Patient taking differently: Take by mouth as needed.) 20 tablet 0  . pneumococcal 20-valent conjugate vaccine (PREVNAR 20) 0.5 ML injection Inject into the muscle. 0.5 mL 0  . polyethylene glycol (MIRALAX / GLYCOLAX) 17 g packet Take 17 g by mouth as needed for mild constipation.    . RSV vaccine recomb adjuvanted (AREXVY) 120 MCG/0.5ML injection Inject into the muscle. 0.5 mL 0  . sennosides-docusate sodium (SENOKOT-S) 8.6-50 MG tablet Take 2 tablets by mouth as needed.    . thiamine (VITAMIN B-1) 100 MG tablet Take 1 tablet (100 mg total) by mouth daily. 100 tablet 0  . valACYclovir (VALTREX) 1000 MG tablet Take 2 tablets by mouth at onset, then 2 tablets 12 hours later. (Patient taking differently: Take 2,000 mg by mouth as needed. PRn) 30 tablet 1  . vitamin B-12 (CYANOCOBALAMIN) 1000 MCG tablet Take 1,000 mcg by mouth at bedtime.    . mirtazapine (REMERON) 7.5 MG tablet Take 1 tablet (7.5 mg total) by mouth at bedtime. 30 tablet 1  . vortioxetine HBr  (TRINTELLIX) 20 MG TABS tablet Take 1 tablet (20 mg total) by mouth daily. 30 tablet 1   No current facility-administered medications for this visit.    Medication Side Effects: Other: tremor  stable.  No worse.  Allergies:  Allergies  Allergen Reactions  . Propranolol Other (See Comments)    Low blood pressure. Bradycardia   . Brexpiprazole Other (See Comments)    Aphasia and catatonia    Past Medical History:  Diagnosis Date  . Anxiety   . Arthritis    "hips, spine" (03/17/2018)  . Bipolar II disorder (HCC)   . CHF (congestive heart failure) (HCC)   . Chronic bronchitis (HCC)   . Chronic lower back pain   . Chronic right hip pain   . CKD (chronic kidney disease), stage II   . Coronary artery disease    stent x1  . Esophagitis, erosive   . GAD (generalized anxiety disorder)   . GERD (gastroesophageal reflux disease)   . Headache    "maybe monthly" (03/17/2018))  . Heart murmur, systolic   . History of adenomatous polyp of colon    08-04-2016  tubular adenoma  . History of blood transfusion 12/2017   "related to vascular hematoma"  . History of electroconvulsive therapy    at [facility]--  started 04-15-2015 to 11-17-2016  total greater than 40 times  . History of hiatal hernia   . Hyperlipidemia   . Hypertension   . Hypothyroidism   . Internal carotid artery stenosis, bilateral    per last duplex 05-01-2014  bilateral ICA 40-59%  . Major depression, chronic  ECT treatments extensive and multiple started 07/ 2016  . Memory loss    "both short and long-term; needs frequent reminders to follow instrucitons" (05/16/2017)  . Migraines    "none in years" (03/17/2018)  . OSA (obstructive sleep apnea)    per study 06/ 2012 moderate OSA  ; "refuses to wear masks" (03/17/2018)  . Osteoporosis   . Pneumonia 07/29/2022  . Poor historian    due to short term memory loss  . Presence of permanent cardiac pacemaker 03/17/2018  . Pulmonary nodule    monitored by pcp  . S/P  placement of cardiac pacemaker 03/17/18 [facility] [employer]  03/18/2018  . Short-term memory loss   . Sick sinus syndrome (HCC)     Family History  Problem Relation Age of Onset  . Heart attack Father 50       deceased  . Hypertension Father   . Heart disease Father   . Dementia Brother   . Parkinson's disease Brother   . Heart disease Brother   . Breast cancer Paternal Aunt        Age 58's  . Breast cancer Paternal Grandmother        Age unknown  . Colon cancer Neg Hx     Social History   Socioeconomic History  . Marital status: Married    Spouse name: Dr. Victorino Dike  . Number of children: 2  . Years of education: Not on file  . Highest education level: Not on file  Occupational History  . Occupation: housewife    Employer: UNEMPLOYED  Tobacco Use  . Smoking status: Former    Current packs/day: 0.00    Average packs/day: 2.0 packs/day for 15.0 years (30.0 ttl pk-yrs)    Types: Cigarettes    Start date: 01/13/1956    Quit date: 01/13/1971    Years since quitting: 52.3    Passive exposure: Never  . Smokeless tobacco: Never  Vaping Use  . Vaping status: Never Used  Substance and Sexual Activity  . Alcohol use: Yes    Comment: occassional 1 x a week  . Drug use: Never  . Sexual activity: Not Currently    Comment: intercourse age 27, sexual partners less than 5  Other Topics Concern  . Not on file  Social History Narrative   Right handed   Social Determinants of Health   Financial Resource Strain: Low Risk  (12/12/2022)   Received from [facility], [facility]   Overall Financial Resource Strain (CARDIA)   . Difficulty of Paying Living Expenses: Not hard at all  Food Insecurity: No Food Insecurity (12/12/2022)   Received from [facility], [facility]   Hunger Vital Sign   . Worried About Programme researcher, broadcasting/film/video in the Last Year: Never true   . Ran Out of Food in the Last Year: Never true  Transportation  Needs: No Transportation Needs (12/12/2022)   Received from [facility], [facility]   [facility] - Transportation   . In the past 12 months, has lack of transportation kept you from medical appointments or from getting medications?: No   . Lack of Transportation (Non-Medical): No  Physical Activity: Not on file  Stress: Not on file  Social Connections: Not on file  Intimate Partner Violence: Not At Risk (07/06/2022)   Humiliation, Afraid, Rape, and Kick questionnaire   . Fear of Current or Ex-Partner: No   . Emotionally Abused: No   .  Physically Abused: No   . Sexually Abused: No    Past Medical History, Surgical history, Social history, and Family history were reviewed and updated as appropriate.   Please see review of systems for further details on the patient's review from today.   Objective:   Physical Exam:  LMP  (LMP Unknown)   Physical Exam Constitutional:      General: She is not in acute distress.    Appearance: She is well-developed.  Musculoskeletal:        General: No deformity.  Neurological:     Mental Status: She is alert and oriented to person, place, and time.     Motor: No tremor.     Coordination: Coordination abnormal.     Comments: Cane use  Psychiatric:        Attention and Perception: She is attentive.        Mood and Affect: Mood is anxious. Mood is not depressed. Affect is not labile, blunt or inappropriate.        Speech: Speech is not slurred.        Behavior: Behavior normal. Behavior is not slowed, withdrawn or combative.        Thought Content: Thought content is not delusional. Thought content does not include homicidal or suicidal ideation. Thought content does not include suicidal plan.        Cognition and Memory: Cognition is impaired. She exhibits impaired recent memory.        Judgment: Judgment normal.     Comments: Insight fair to good. No auditory or visual hallucinations.  Depression and anxiety are  remarkably improved but not resolved. Delusional guilt not elicited Affect pleasant and smiling .     Lab Review:     Component Value Date/Time   NA 138 01/14/2023 1242   NA 137 12/29/2022 0000   K 4.0 01/14/2023 1242   CL 103 01/14/2023 1242   CO2 23 01/14/2023 1242   GLUCOSE 87 01/14/2023 1242   BUN 19 01/14/2023 1242   BUN 12 12/29/2022 0000   CREATININE 0.68 01/14/2023 1242   CALCIUM 8.4 (L) 01/14/2023 1242   PROT 6.9 11/11/2022 1106   PROT 6.2 04/20/2017 1151   ALBUMIN 3.9 09/02/2022 1221   ALBUMIN 3.9 04/20/2017 1151   AST 44 (H) 11/11/2022 1106   ALT 86 (H) 11/11/2022 1106   ALKPHOS 64 09/02/2022 1221   BILITOT 0.2 11/11/2022 1106   BILITOT <0.2 04/20/2017 1151   GFRNONAA >60 07/08/2022 0429   GFRNONAA 50 (L) 07/10/2020 1021   GFRAA 58 (L) 07/10/2020 1021       Component Value Date/Time   WBC 6.3 12/29/2022 0000   WBC 6.6 11/11/2022 1106   RBC 3.88 11/11/2022 1106   HGB 10.3 (A) 12/29/2022 0000   HGB 10.2 (L) 04/20/2017 1151   HCT 32 (A) 12/29/2022 0000   HCT 29.6 (L) 04/20/2017 1151   PLT 231 12/29/2022 0000   PLT 385 (H) 04/20/2017 1151   MCV 93.6 11/11/2022 1106   MCV 89 04/20/2017 1151   MCH 31.2 11/11/2022 1106   MCHC 33.3 11/11/2022 1106   RDW 13.7 11/11/2022 1106   RDW 13.9 04/20/2017 1151   LYMPHSABS 1,439 11/11/2022 1106   LYMPHSABS 1.5 04/20/2017 1151   MONOABS 0.7 07/07/2022 0458   EOSABS 112 11/11/2022 1106   EOSABS 0.3 04/20/2017 1151   BASOSABS 53 11/11/2022 1106   BASOSABS 0.0 04/20/2017 1151    Lithium Lvl  Date Value Ref Range Status  10/21/2022  0.5 (L) 0.6 - 1.2 mmol/L Final  10/21/2022 lithium level 0.5 on 300 mg nightly is stable.,  Nortriptyline 115 on 75 mg HS.  08/02/22 nortriptyline 85, lihtium 0.5  Nortriptyline level 51 on 50 mg nightly with paroxetine 20 mg daily  07/10/2020 lithium level 0.6 at [facility] office and nortriptyline level 130 on the current dosages of lithium 300 mg nightly and nortriptyline 75 mg  nightly  02/20/2021 labs lithium 0.6 on 300 mg nightly.  Nortriptyline level 49 which is lower than expected on 75 mg nightly along with paroxetine 20 mg daily.    No results found for: "PHENYTOIN", "PHENOBARB", "VALPROATE", "CBMZ"   .res Assessment: Plan:    Severe bipolar I disorder, current or most recent episode depressed, with catatonia (HCC) - Plan: vortioxetine HBr (TRINTELLIX) 20 MG TABS tablet  Generalized anxiety disorder - Plan: vortioxetine HBr (TRINTELLIX) 20 MG TABS tablet  Mild cognitive impairment  Low vitamin D level  Catatonia  Essential tremor  Insomnia due to mental condition - Plan: mirtazapine (REMERON) 7.5 MG tablet    History of lithium toxicity repeatedly while on chlorthalidone. No tremor px off of the lithium.    50-minute visit today.  Hx TX resistant bipolar depression with catatonia.  Recent decompensation and rapidly getting worse with some cognive impairment and mild psychotic sx and altered mental status.Depression and anxiety much worse in the last several weeks.  Her case has been highly complex and treatment resistant at times.  No current catatonia but is somewhat anxious easily.    She had remarkedly improved with the ECT + change in psychiatric medicine to Latuda 60 mg daily with clonazepam 0.5 mg twice daily. She is not ruminative.  Affect is subdued.  She iis not as bad as before ECT.  Catatonia is resolved but she is anhedonic, asocial, less talkative.   She is still anxious and somewhat insecure.  Short-term memory impairment is consistent with what would be expected with the ECT at this time.  options of trials of Trintellix, Auvelity, etc.Reviewed the old chart and consider very low-dose Vraylar   Hospitalized at [facility] on psychiatric unit from 12/10/2022 to 01/03/2023 and received ECT and started Latuda 60 mg daily.  Stopped paroxetine, nortriptyline, and lithium. Not able to wean ECT yet  We discussed her long history of  extremely severe treatment resistant major depression.  During the severe depression she has catatonia.  She had required longterm ECT.  A previous episode of depression approximately 10 years ago stayed in remission on Paxil and nortriptyline and lithium for a period of years until the lithium level was decreased due to tremor.  Now we are maintaining an adequate lithium level.  She has had several episodes of lithium toxicity with hospitalization.  This resolved once she stopped chlorthalidone.  From records it appears her last ECT was in September 2020.   Previously Disc Dr. Blossom Hoops concerns about longterm ECT on cognition.  Disc therapy for her  through Dr. Terrall Laity  3-4 times.  Didn't seem to help.  Seeing another person at [facility].  Trouble with memory interferes with some of the benefit.  For catatonia: clonazepam to 0.25 mg AM and 0.375 mg HS for 2 weeks then 0.25 mg BID for cog reasons and not currently catatonic Continue Latuda 60 mg daily Continue lorazepam 0.5 mg as needed which is currently being used rarely. Incr Trintellix to 20 mg daily off label for bipolar dep.  Trying to find solution that would allow stopping  ECT  Recommend continue ECT  but increase to twice weekly as [facility] recommends but considering reduction to every 3 weeks is reasonable.  She is not dep now.  FU 4 weeks  Meredith Staggers MD, DFAPA Please see After Visit Summary for patient specific instructions.  Future Appointments  Date Time Provider Department Center  07/04/2023  3:20 PM [facility] DEVICE REMOTES [facility]  10/03/2023  7:00 AM [facility] DEVICE REMOTES [facility]  01/02/2024  7:00 AM [facility] DEVICE REMOTES [facility]  04/02/2024  7:00 AM [facility] DEVICE REMOTES [facility]  07/02/2024  7:00 AM [facility] DEVICE REMOTES [facility]  10/01/2024  7:00 AM [facility] DEVICE REMOTES [facility]  12/31/2024   7:00 AM [facility] DEVICE REMOTES [facility]  04/01/2025  7:00 AM [facility] DEVICE REMOTES [facility]  07/01/2025  7:00 AM [facility] DEVICE REMOTES [facility]  09/30/2025  7:00 AM [facility] DEVICE REMOTES [facility]      No orders of the defined types were placed in this encounter.      -------------------------------

## 2023-05-21 ENCOUNTER — Other Ambulatory Visit: Payer: Self-pay

## 2023-05-21 ENCOUNTER — Other Ambulatory Visit (HOSPITAL_BASED_OUTPATIENT_CLINIC_OR_DEPARTMENT_OTHER): Payer: Self-pay

## 2023-05-23 ENCOUNTER — Other Ambulatory Visit: Payer: Self-pay

## 2023-05-23 ENCOUNTER — Other Ambulatory Visit (HOSPITAL_BASED_OUTPATIENT_CLINIC_OR_DEPARTMENT_OTHER): Payer: Self-pay

## 2023-05-24 DIAGNOSIS — E039 Hypothyroidism, unspecified: Secondary | ICD-10-CM | POA: Diagnosis not present

## 2023-05-24 DIAGNOSIS — I5032 Chronic diastolic (congestive) heart failure: Secondary | ICD-10-CM | POA: Diagnosis not present

## 2023-05-24 DIAGNOSIS — F312 Bipolar disorder, current episode manic severe with psychotic features: Secondary | ICD-10-CM | POA: Diagnosis not present

## 2023-05-24 DIAGNOSIS — G4730 Sleep apnea, unspecified: Secondary | ICD-10-CM | POA: Diagnosis not present

## 2023-05-24 DIAGNOSIS — I2510 Atherosclerotic heart disease of native coronary artery without angina pectoris: Secondary | ICD-10-CM | POA: Diagnosis not present

## 2023-05-25 ENCOUNTER — Other Ambulatory Visit (HOSPITAL_BASED_OUTPATIENT_CLINIC_OR_DEPARTMENT_OTHER): Payer: Self-pay

## 2023-05-26 ENCOUNTER — Other Ambulatory Visit: Payer: Self-pay

## 2023-05-26 ENCOUNTER — Other Ambulatory Visit (HOSPITAL_BASED_OUTPATIENT_CLINIC_OR_DEPARTMENT_OTHER): Payer: Self-pay

## 2023-05-27 ENCOUNTER — Other Ambulatory Visit (HOSPITAL_BASED_OUTPATIENT_CLINIC_OR_DEPARTMENT_OTHER): Payer: Self-pay

## 2023-05-31 ENCOUNTER — Other Ambulatory Visit (HOSPITAL_BASED_OUTPATIENT_CLINIC_OR_DEPARTMENT_OTHER): Payer: Self-pay

## 2023-06-01 ENCOUNTER — Other Ambulatory Visit (HOSPITAL_BASED_OUTPATIENT_CLINIC_OR_DEPARTMENT_OTHER): Payer: Self-pay

## 2023-06-01 MED ORDER — CLONIDINE HCL 0.1 MG PO TABS
0.1000 mg | ORAL_TABLET | ORAL | 2 refills | Status: DC
  Filled 2023-07-19: qty 20, 20d supply, fill #0
  Filled 2023-09-07: qty 20, 20d supply, fill #1
  Filled 2023-09-24: qty 20, 20d supply, fill #2

## 2023-06-02 ENCOUNTER — Other Ambulatory Visit (HOSPITAL_BASED_OUTPATIENT_CLINIC_OR_DEPARTMENT_OTHER): Payer: Self-pay

## 2023-06-03 ENCOUNTER — Telehealth: Payer: Self-pay

## 2023-06-03 ENCOUNTER — Other Ambulatory Visit (HOSPITAL_COMMUNITY): Payer: Self-pay

## 2023-06-03 NOTE — Telephone Encounter (Signed)
Pharmacy Patient Advocate Encounter   Received notification from CoverMyMeds that prior authorization for DEXLANSOPRAZOLE 60MG  is required/requested.   Insurance verification completed.   The patient is insured through [facility] .   Per test claim: PA required; PA submitted to [employer] via CoverMyMeds Key/confirmation #/EOC [id number] Status is pending

## 2023-06-03 NOTE — Telephone Encounter (Signed)
Pharmacy Patient Advocate Encounter  Received notification from [facility] that Prior Authorization for Dexlansoprazole 60mg   has been APPROVED from 06/03/23 to 06/02/24. Ran test claim, Copay is $147.45 for 90 day supply and $49.24 for 30 day supply. This test claim was processed through [facility]- copay amounts may vary at other pharmacies due to pharmacy/plan contracts, or as the patient moves through the different stages of their insurance plan.   PA #/Case ID/Reference #: [id number]

## 2023-06-09 ENCOUNTER — Other Ambulatory Visit (HOSPITAL_BASED_OUTPATIENT_CLINIC_OR_DEPARTMENT_OTHER): Payer: Self-pay

## 2023-06-09 ENCOUNTER — Other Ambulatory Visit: Payer: Self-pay

## 2023-06-10 ENCOUNTER — Other Ambulatory Visit (HOSPITAL_BASED_OUTPATIENT_CLINIC_OR_DEPARTMENT_OTHER): Payer: Self-pay

## 2023-06-13 ENCOUNTER — Other Ambulatory Visit (HOSPITAL_BASED_OUTPATIENT_CLINIC_OR_DEPARTMENT_OTHER): Payer: Self-pay

## 2023-06-13 ENCOUNTER — Other Ambulatory Visit: Payer: Self-pay

## 2023-06-14 ENCOUNTER — Other Ambulatory Visit (HOSPITAL_BASED_OUTPATIENT_CLINIC_OR_DEPARTMENT_OTHER): Payer: Self-pay

## 2023-06-14 ENCOUNTER — Telehealth: Payer: Self-pay | Admitting: Internal Medicine

## 2023-06-14 DIAGNOSIS — F332 Major depressive disorder, recurrent severe without psychotic features: Secondary | ICD-10-CM | POA: Diagnosis not present

## 2023-06-14 DIAGNOSIS — R7989 Other specified abnormal findings of blood chemistry: Secondary | ICD-10-CM | POA: Diagnosis not present

## 2023-06-14 DIAGNOSIS — Z01818 Encounter for other preprocedural examination: Secondary | ICD-10-CM | POA: Diagnosis not present

## 2023-06-14 DIAGNOSIS — I110 Hypertensive heart disease with heart failure: Secondary | ICD-10-CM | POA: Diagnosis not present

## 2023-06-14 DIAGNOSIS — J849 Interstitial pulmonary disease, unspecified: Secondary | ICD-10-CM | POA: Diagnosis not present

## 2023-06-14 DIAGNOSIS — I272 Pulmonary hypertension, unspecified: Secondary | ICD-10-CM | POA: Diagnosis not present

## 2023-06-14 DIAGNOSIS — E039 Hypothyroidism, unspecified: Secondary | ICD-10-CM | POA: Diagnosis not present

## 2023-06-14 DIAGNOSIS — G3184 Mild cognitive impairment, so stated: Secondary | ICD-10-CM | POA: Diagnosis not present

## 2023-06-14 DIAGNOSIS — I2510 Atherosclerotic heart disease of native coronary artery without angina pectoris: Secondary | ICD-10-CM | POA: Diagnosis not present

## 2023-06-14 DIAGNOSIS — Z950 Presence of cardiac pacemaker: Secondary | ICD-10-CM | POA: Diagnosis not present

## 2023-06-14 DIAGNOSIS — F319 Bipolar disorder, unspecified: Secondary | ICD-10-CM | POA: Diagnosis not present

## 2023-06-14 DIAGNOSIS — F322 Major depressive disorder, single episode, severe without psychotic features: Secondary | ICD-10-CM | POA: Diagnosis not present

## 2023-06-14 DIAGNOSIS — F0390 Unspecified dementia without behavioral disturbance: Secondary | ICD-10-CM | POA: Diagnosis not present

## 2023-06-14 DIAGNOSIS — E8809 Other disorders of plasma-protein metabolism, not elsewhere classified: Secondary | ICD-10-CM | POA: Diagnosis not present

## 2023-06-14 DIAGNOSIS — I6523 Occlusion and stenosis of bilateral carotid arteries: Secondary | ICD-10-CM | POA: Diagnosis not present

## 2023-06-14 DIAGNOSIS — Z87898 Personal history of other specified conditions: Secondary | ICD-10-CM | POA: Diagnosis not present

## 2023-06-14 DIAGNOSIS — Z8701 Personal history of pneumonia (recurrent): Secondary | ICD-10-CM | POA: Diagnosis not present

## 2023-06-14 DIAGNOSIS — I495 Sick sinus syndrome: Secondary | ICD-10-CM | POA: Diagnosis not present

## 2023-06-14 DIAGNOSIS — Z8719 Personal history of other diseases of the digestive system: Secondary | ICD-10-CM | POA: Diagnosis not present

## 2023-06-14 DIAGNOSIS — G4730 Sleep apnea, unspecified: Secondary | ICD-10-CM | POA: Diagnosis not present

## 2023-06-14 DIAGNOSIS — I10 Essential (primary) hypertension: Secondary | ICD-10-CM | POA: Diagnosis not present

## 2023-06-14 DIAGNOSIS — Z9189 Other specified personal risk factors, not elsewhere classified: Secondary | ICD-10-CM | POA: Diagnosis not present

## 2023-06-14 DIAGNOSIS — Z955 Presence of coronary angioplasty implant and graft: Secondary | ICD-10-CM | POA: Diagnosis not present

## 2023-06-14 DIAGNOSIS — I5030 Unspecified diastolic (congestive) heart failure: Secondary | ICD-10-CM | POA: Diagnosis not present

## 2023-06-14 MED ORDER — OMEPRAZOLE-SODIUM BICARBONATE 40-1100 MG PO CAPS
1.0000 | ORAL_CAPSULE | Freq: Every day | ORAL | 3 refills | Status: AC
Start: 2023-06-14 — End: ?
  Filled 2023-06-14: qty 90, 90d supply, fill #0
  Filled 2023-09-12 – 2023-09-24 (×2): qty 90, 90d supply, fill #1

## 2023-06-14 NOTE — Telephone Encounter (Signed)
Spoke with Enrique Sack from [place]  and she states patient is having surgery 10/2 and she would need a PPM interrogation on her device before then. She would like to know if patient can have it done when she come to appointment on 9/13? Please advise

## 2023-06-14 NOTE — Telephone Encounter (Signed)
Meghan Oliver from [facility] is requesting a callback regarding pt needing to have a interrogation of her device but wanted to know if it could be done at her appt on 9/13 since she'll need it done before her surgery on 07/06/2023. Please advise

## 2023-06-14 NOTE — Telephone Encounter (Signed)
Currently Dexilant is not covered, in donut hole. Will switch to Zegerid, please send 90-day supply with 3 refills.

## 2023-06-14 NOTE — Telephone Encounter (Signed)
Sent in Zegerid as requested by Dr Lavon Paganini

## 2023-06-15 ENCOUNTER — Other Ambulatory Visit (HOSPITAL_BASED_OUTPATIENT_CLINIC_OR_DEPARTMENT_OTHER): Payer: Self-pay

## 2023-06-16 ENCOUNTER — Other Ambulatory Visit (HOSPITAL_BASED_OUTPATIENT_CLINIC_OR_DEPARTMENT_OTHER): Payer: Self-pay

## 2023-06-17 ENCOUNTER — Ambulatory Visit: Payer: Medicare Other | Attending: Internal Medicine | Admitting: Internal Medicine

## 2023-06-17 ENCOUNTER — Encounter: Payer: Self-pay | Admitting: Internal Medicine

## 2023-06-17 ENCOUNTER — Telehealth: Payer: Self-pay

## 2023-06-17 VITALS — BP 132/60 | HR 60 | Ht 59.0 in | Wt 101.0 lb

## 2023-06-17 DIAGNOSIS — I5022 Chronic systolic (congestive) heart failure: Secondary | ICD-10-CM | POA: Diagnosis not present

## 2023-06-17 LAB — CUP PACEART INCLINIC DEVICE CHECK
Battery Remaining Longevity: 66 mo
Battery Voltage: 2.99 V
Brady Statistic RA Percent Paced: 78 %
Brady Statistic RV Percent Paced: 0.06 %
Date Time Interrogation Session: 20240913173711
Implantable Lead Connection Status: 753985
Implantable Lead Connection Status: 753985
Implantable Lead Implant Date: 20190614
Implantable Lead Implant Date: 20190614
Implantable Lead Location: 753859
Implantable Lead Location: 753860
Implantable Pulse Generator Implant Date: 20190614
Lead Channel Impedance Value: 487.5 Ohm
Lead Channel Impedance Value: 525 Ohm
Lead Channel Pacing Threshold Amplitude: 0.5 V
Lead Channel Pacing Threshold Amplitude: 0.5 V
Lead Channel Pacing Threshold Amplitude: 1 V
Lead Channel Pacing Threshold Amplitude: 1 V
Lead Channel Pacing Threshold Pulse Width: 0.5 ms
Lead Channel Pacing Threshold Pulse Width: 0.5 ms
Lead Channel Pacing Threshold Pulse Width: 0.5 ms
Lead Channel Pacing Threshold Pulse Width: 0.5 ms
Lead Channel Sensing Intrinsic Amplitude: 12 mV
Lead Channel Sensing Intrinsic Amplitude: 3.2 mV
Lead Channel Setting Pacing Amplitude: 1.375
Lead Channel Setting Pacing Amplitude: 2 V
Lead Channel Setting Pacing Pulse Width: 0.5 ms
Lead Channel Setting Sensing Sensitivity: 2 mV
Pulse Gen Model: 2272
Pulse Gen Serial Number: 9031934

## 2023-06-17 NOTE — Telephone Encounter (Signed)
I ordered the patient a new monitor and cell adapter. She should receive the new equipment in 7-10 business days.

## 2023-06-17 NOTE — Progress Notes (Signed)
HPI Meghan Oliver returns today for followup. She is a pleasant 82 yo woman with a h/o sinus node dysfunction who developed prolonged pauses and underwent PPM insertion about 5 years ago. In the interim, she has been stable but has had more problems with depression and is pending more ECT therapy. She denies syncope.  Her speech is scant but she has no complaints and answers questions appropriately. No anginal symptoms.  Allergies  Allergen Reactions   Propranolol Other (See Comments)    Low blood pressure. Bradycardia    Brexpiprazole Other (See Comments)    Aphasia and catatonia     Current Outpatient Medications  Medication Sig Dispense Refill   amLODipine (NORVASC) 10 MG tablet Take 1 tablet (10 mg total) by mouth daily. 90 tablet 1   aspirin 81 MG chewable tablet Chew 1 tablet (81 mg total) by mouth daily.     Calcium Carbonate-Vitamin D (CALCIUM 600/VITAMIN D PO) Take 1 tablet by mouth 2 (two) times daily.     cetirizine (ZYRTEC) 10 MG tablet Take 10 mg by mouth as needed for allergies.     Cholecalciferol (VITAMIN D3) 50 MCG (2000 UT) TABS Take 2,000 Units by mouth in the morning.     clonazePAM (KLONOPIN) 0.5 MG tablet Take 2 tablets (1 mg total) by mouth 2 (two) times daily. 360 tablet 0   cloNIDine (CATAPRES) 0.1 MG tablet Take 1 tablet (0.1 mg total) by mouth as needed prior to ECT treatment 20 tablet 2   COVID-19 mRNA vaccine 2023-2024 (COMIRNATY) syringe Inject into the muscle. 0.3 mL 0   dexlansoprazole (DEXILANT) 60 MG capsule Take 1 capsule by mouth daily 90 capsule 3   fesoterodine (TOVIAZ) 8 MG TB24 tablet Take 1 tablet (8 mg total) by mouth daily. 90 tablet 3   glycerin adult 2 g suppository Use 1 suppository as needed if you have not had a bowel movement in  4 to 5 days 25 suppository 0   hydrochlorothiazide (HYDRODIURIL) 12.5 MG tablet Take 1 tablet (12.5 mg total) by mouth daily. 90 tablet 1   influenza vaccine adjuvanted (FLUAD QUADRIVALENT) 0.5 ML injection  Inject into the muscle. 0.5 mL 0   Krill Oil 500 MG CAPS Take 500 mg by mouth daily.     lactulose (CHRONULAC) 10 GM/15ML solution Take 30 mLs (20 g total) by mouth 3 (three) times daily between meals as needed for mild constipation. (Patient taking differently: Take 20 g by mouth daily as needed for mild constipation.) 473 mL 3   Lavender Oil 80 MG CAPS Take 160 mg by mouth at bedtime.     levothyroxine (SYNTHROID) 75 MCG tablet Take 1 tablet (75 mcg total) by mouth daily. 90 tablet 2   levothyroxine (SYNTHROID) 75 MCG tablet Take 1 tablet (75 mcg total) by mouth daily. 90 tablet 2   levothyroxine (SYNTHROID) 75 MCG tablet Take 1 tablet (75 mcg total) by mouth daily. 90 tablet 2   linaclotide (LINZESS) 72 MCG capsule Take 2 capsules (144 mcg total) by mouth daily before breakfast. 180 capsule 1   LORazepam (ATIVAN) 0.5 MG tablet TAKE 2 TABLETS (1 MG)  BY MOUTH EVERY NIGHT AT BEDTIME AND 1 TABLET EVERY 4 HOURS AS NEEDED FOR ANXIETY 270 tablet 0   losartan (COZAAR) 100 MG tablet Take 1 tablet (100 mg total) by mouth daily. 90 tablet 1   Lurasidone HCl 60 MG TABS Take 1 tablet (60 mg total) by mouth daily after supper. 90 tablet  0   magic mouthwash w/lidocaine SOLN Take 5 mLs by mouth 3 (three) times daily as needed for mouth pain. (Patient taking differently: Take 5 mLs by mouth as needed for mouth pain.) 120 mL 0   memantine (NAMENDA) 10 MG tablet Take 1 tablet (10 mg total) by mouth 2 (two) times daily. 180 tablet 1   metoprolol tartrate (LOPRESSOR) 25 MG tablet Take 1 tablet (25 mg total) by mouth as needed. 45 tablet 3   mirtazapine (REMERON) 7.5 MG tablet Take 1 tablet (7.5 mg total) by mouth at bedtime. 30 tablet 1   Multiple Vitamins-Minerals (MULTIVITAMIN WITH MINERALS) tablet Take 1 tablet by mouth daily.     Multiple Vitamins-Minerals (PRESERVISION AREDS 2 PO) Take 1 capsule by mouth in the morning and at bedtime.     naloxegol oxalate (MOVANTIK) 12.5 MG TABS tablet Take 2 tablets (25 mg  total) by mouth daily. (Patient taking differently: Take 25 mg by mouth daily. PRN) 60 tablet 3   neomycin-polymyxin b-dexamethasone (MAXITROL) 3.5-10000-0.1 OINT Apply thin line (1 cm) of ointment to right eye twice daily for 1 week. 3.5 g 1   ofloxacin (OCUFLOX) 0.3 % ophthalmic solution Instill 1 drop into right eye four times daily for 7 days. 5 mL 1   omeprazole-sodium bicarbonate (ZEGERID) 40-1100 MG capsule Take 1 capsule by mouth daily before breakfast. 90 capsule 3   ondansetron (ZOFRAN-ODT) 4 MG disintegrating tablet Place 1 tablet every 6 hours by translingual route as needed. (Patient taking differently: Take by mouth as needed.) 20 tablet 0   pneumococcal 20-valent conjugate vaccine (PREVNAR 20) 0.5 ML injection Inject into the muscle. 0.5 mL 0   polyethylene glycol (MIRALAX / GLYCOLAX) 17 g packet Take 17 g by mouth as needed for mild constipation.     RSV vaccine recomb adjuvanted (AREXVY) 120 MCG/0.5ML injection Inject into the muscle. 0.5 mL 0   sennosides-docusate sodium (SENOKOT-S) 8.6-50 MG tablet Take 2 tablets by mouth as needed.     thiamine (VITAMIN B-1) 100 MG tablet Take 1 tablet (100 mg total) by mouth daily. 100 tablet 0   valACYclovir (VALTREX) 1000 MG tablet Take 2 tablets by mouth at onset, then 2 tablets 12 hours later. (Patient taking differently: Take 2,000 mg by mouth as needed. PRn) 30 tablet 1   vitamin B-12 (CYANOCOBALAMIN) 1000 MCG tablet Take 1,000 mcg by mouth at bedtime.     vortioxetine HBr (TRINTELLIX) 20 MG TABS tablet Take 1 tablet (20 mg total) by mouth daily. 30 tablet 1   No current facility-administered medications for this visit.     Past Medical History:  Diagnosis Date   Anxiety    Arthritis    "hips, spine" (03/17/2018)   Bipolar II disorder (HCC)    CHF (congestive heart failure) (HCC)    Chronic bronchitis (HCC)    Chronic lower back pain    Chronic right hip pain    CKD (chronic kidney disease), stage II    Coronary artery disease     stent x1   Esophagitis, erosive    GAD (generalized anxiety disorder)    GERD (gastroesophageal reflux disease)    Headache    "maybe monthly" (03/17/2018))   Heart murmur, systolic    History of adenomatous polyp of colon    08-04-2016  tubular adenoma   History of blood transfusion 12/2017   "related to vascular hematoma"   History of electroconvulsive therapy    at [facility]--  started 04-15-2015 to 11-17-2016  total greater  than 40 times   History of hiatal hernia    Hyperlipidemia    Hypertension    Hypothyroidism    Internal carotid artery stenosis, bilateral    per last duplex 05-01-2014  bilateral ICA 40-59%   Major depression, chronic    ECT treatments extensive and multiple started 07/ 2016   Memory loss    "both short and long-term; needs frequent reminders to follow instrucitons" (05/16/2017)   Migraines    "none in years" (03/17/2018)   OSA (obstructive sleep apnea)    per study 06/ 2012 moderate OSA  ; "refuses to wear masks" (03/17/2018)   Osteoporosis    Pneumonia 07/29/2022   Poor historian    due to short term memory loss   Presence of permanent cardiac pacemaker 03/17/2018   Pulmonary nodule    monitored by pcp   S/P placement of cardiac pacemaker 03/17/18 [facility]  03/18/2018   Short-term memory loss    Sick sinus syndrome (HCC)     ROS:   All systems reviewed and negative except as noted in the HPI.   Past Surgical History:  Procedure Laterality Date   APPENDECTOMY  1971   ARTERY BIOPSY Left 07/05/2022   Procedure: BIOPSY TEMPORAL ARTERY;  Surgeon: Victorino Sparrow, MD;  Location: [facility];  Service: Vascular;  Laterality: Left;   AUGMENTATION MAMMAPLASTY Bilateral    CARDIOVASCULAR STRESS TEST  01-30-2015  dr Shirlee Latch   Low risk nuclear study w/ no evidence ischemia or infarction/  normal LV funciton and wall motion , 76%   COLONOSCOPY W/ BIOPSIES AND POLYPECTOMY  "multiple"   COLONOSCOPY WITH ESOPHAGOGASTRODUODENOSCOPY (EGD)  last one 08-04-2016    CORONARY ANGIOPLASTY WITH STENT PLACEMENT  05/16/2017   "LAD"   CORONARY STENT INTERVENTION N/A 05/16/2017   Procedure: CORONARY STENT INTERVENTION;  Surgeon: Kathleene Hazel, MD;  Location: [facility] INVASIVE CV LAB;  Service: Cardiovascular;  Laterality: N/A;   ESOPHAGOGASTRODUODENOSCOPY  02-26-04   ESOPHAGOGASTRODUODENOSCOPY  "multiple"   INSERT / REPLACE / REMOVE PACEMAKER  03/17/2018   IR THORACENTESIS ASP PLEURAL SPACE W/IMG GUIDE  07/07/2022   LEFT HEART CATH AND CORONARY ANGIOGRAPHY N/A 05/16/2017   Procedure: LEFT HEART CATH AND CORONARY ANGIOGRAPHY;  Surgeon: Laurey Morale, MD;  Location: [facility] INVASIVE CV LAB;  Service: Cardiovascular;  Laterality: N/A;   OVARIAN CYST SURGERY  1970s   Laparotomy    PACEMAKER IMPLANT N/A 03/17/2018   Procedure: PACEMAKER IMPLANT;  Surgeon: Marinus Maw, MD;  Location: [facility] INVASIVE CV LAB;  Service: Cardiovascular;  Laterality: N/A;   PORT-A-CATH PLACEMENT  05/31/2016; 2018   "@ [facility]; for ECT series"; "@ [facility] also"   PORT-A-CATH REMOVAL N/A 03/11/2017   Procedure: REMOVAL PORT-A-CATH;  Surgeon: Avel Peace, MD;  Location: [facility];  Service: General;  Laterality: N/A;   PORTA CATH REMOVAL  03/17/2018   PORTA CATH REMOVAL  03/17/2018   Procedure: PORTA CATH REMOVAL;  Surgeon: Marinus Maw, MD;  Location: [facility] INVASIVE CV LAB;  Service: Cardiovascular;;   TOTAL HIP ARTHROPLASTY Right 05/18/2022   Procedure: TOTAL HIP ARTHROPLASTY ANTERIOR APPROACH;  Surgeon: Durene Romans, MD;  Location: [facility] ORS;  Service: Orthopedics;  Laterality: Right;   TRANSTHORACIC ECHOCARDIOGRAM  09/05/2013  dr Shirlee Latch   mild LVH, ef 65-70%, grade 1 diastolic dysfunction/  very mild AV stenosis with mild AR/  trivial MR and PT/ mild to moderate LAE/ mild TR/ mild pulmonary hypertension with PA peak pressure   TUBAL LIGATION  Family History  Problem Relation Age of Onset   Heart attack Father 67       deceased   Hypertension Father    Heart  disease Father    Dementia Brother    Parkinson's disease Brother    Heart disease Brother    Breast cancer Paternal Aunt        Age 71's   Breast cancer Paternal Grandmother        Age unknown   Colon cancer Neg Hx      Social History   Socioeconomic History   Marital status: Married    Spouse name: Dr. Victorino Dike   Number of children: 2   Years of education: Not on file   Highest education level: Not on file  Occupational History   Occupation: housewife    Employer: UNEMPLOYED  Tobacco Use   Smoking status: Former    Current packs/day: 0.00    Average packs/day: 2.0 packs/day for 15.0 years (30.0 ttl pk-yrs)    Types: Cigarettes    Start date: 01/13/1956    Quit date: 01/13/1971    Years since quitting: 52.4    Passive exposure: Never   Smokeless tobacco: Never  Vaping Use   Vaping status: Never Used  Substance and Sexual Activity   Alcohol use: Yes    Comment: occassional 1 x a week   Drug use: Never   Sexual activity: Not Currently    Comment: intercourse age 67, sexual partners less than 5  Other Topics Concern   Not on file  Social History Narrative   Right handed   Social Determinants of Health   Financial Resource Strain: Low Risk  (12/12/2022)   Received from [facility], [facility]   Overall Financial Resource Strain (CARDIA)    Difficulty of Paying Living Expenses: Not hard at all  Food Insecurity: No Food Insecurity (12/12/2022)   Received from [facility], [facility]   Hunger Vital Sign    Worried About Running Out of Food in the Last Year: Never true    Ran Out of Food in the Last Year: Never true  Transportation Needs: No Transportation Needs (12/12/2022)   Received from [facility], [facility]   [facility] - Transportation    In the past 12 months, has lack of transportation kept you from medical appointments or from getting medications?:  No    Lack of Transportation (Non-Medical): No  Physical Activity: Not on file  Stress: Not on file  Social Connections: Not on file  Intimate Partner Violence: Not At Risk (07/06/2022)   Humiliation, Afraid, Rape, and Kick questionnaire    Fear of Current or Ex-Partner: No    Emotionally Abused: No    Physically Abused: No    Sexually Abused: No     BP 132/60   Pulse 60   Ht 4\' 11"  (1.499 m)   Wt 101 lb (45.8 kg)   LMP  (LMP Unknown)   SpO2 97%   BMI 20.40 kg/m   Physical Exam:  stable appearing 82 yo woman, NAD HEENT: Unremarkable Neck:  No JVD, no thyromegally Lymphatics:  No adenopathy Back:  No CVA tenderness Lungs:  Clear with no wheezes HEART:  Regular rate rhythm, no murmurs, no rubs, no clicks Abd:  soft, positive bowel sounds, no organomegally, no rebound, no guarding Ext:  2 plus pulses, no edema, no cyanosis, no clubbing Skin:  No rashes no nodules  Neuro:  CN II through XII intact, motor grossly intact  EKG - nsr with atrial pacing  DEVICE  Normal device function.  See PaceArt for details.   Assess/Plan: Sinus node dysfunction - she is stable s/p PPM insertion.  PPM -her [facility] [facility] DDD PM is working normally. I encouraged them to restart home monitoring. CAD - she is s/p PCI remotely and is doing well with no anginal symptoms. Depression - she is pending more ECT. She is an acceptable risk for more therapy.   Meghan Gowda Braylon Lemmons,MD

## 2023-06-17 NOTE — Patient Instructions (Signed)
Medication Instructions:  Your physician recommends that you continue on your current medications as directed. Please refer to the Current Medication list given to you today.  *If you need a refill on your cardiac medications before your next appointment, please call your pharmacy*  Lab Work: None ordered.  If you have labs (blood work) drawn today and your tests are completely normal, you will receive your results only by: MyChart Message (if you have MyChart) OR A paper copy in the mail If you have any lab test that is abnormal or we need to change your treatment, we will call you to review the results.  Testing/Procedures: None ordered.  Follow-Up: At [facility], you and your health needs are our priority.  As part of our continuing mission to provide you with exceptional heart care, we have created designated Provider Care Teams.  These Care Teams include your primary Cardiologist (physician) and Advanced Practice Providers (APPs -  Physician Assistants and Nurse Practitioners) who all work together to provide you with the care you need, when you need it.  Your next appointment:   1 year(s)  The format for your next appointment:   In Person  Provider:   Lewayne Bunting, MD{or one of the following Advanced Practice Providers on your designated Care Team:   Francis Dowse, [place] Casimiro Needle "Mardelle Matte" [place], [place] Earnest Rosier, NP   Important Information About Sugar

## 2023-06-20 DIAGNOSIS — N189 Chronic kidney disease, unspecified: Secondary | ICD-10-CM | POA: Diagnosis not present

## 2023-06-20 DIAGNOSIS — I1310 Hypertensive heart and chronic kidney disease without heart failure, with stage 1 through stage 4 chronic kidney disease, or unspecified chronic kidney disease: Secondary | ICD-10-CM | POA: Diagnosis not present

## 2023-06-20 DIAGNOSIS — F332 Major depressive disorder, recurrent severe without psychotic features: Secondary | ICD-10-CM | POA: Diagnosis not present

## 2023-06-20 DIAGNOSIS — E039 Hypothyroidism, unspecified: Secondary | ICD-10-CM | POA: Diagnosis not present

## 2023-06-20 DIAGNOSIS — G4730 Sleep apnea, unspecified: Secondary | ICD-10-CM | POA: Diagnosis not present

## 2023-06-20 DIAGNOSIS — F419 Anxiety disorder, unspecified: Secondary | ICD-10-CM | POA: Diagnosis not present

## 2023-06-20 DIAGNOSIS — Z951 Presence of aortocoronary bypass graft: Secondary | ICD-10-CM | POA: Diagnosis not present

## 2023-06-20 DIAGNOSIS — G8929 Other chronic pain: Secondary | ICD-10-CM | POA: Diagnosis not present

## 2023-06-20 DIAGNOSIS — I2510 Atherosclerotic heart disease of native coronary artery without angina pectoris: Secondary | ICD-10-CM | POA: Diagnosis not present

## 2023-06-20 DIAGNOSIS — I5032 Chronic diastolic (congestive) heart failure: Secondary | ICD-10-CM | POA: Diagnosis not present

## 2023-06-20 DIAGNOSIS — K219 Gastro-esophageal reflux disease without esophagitis: Secondary | ICD-10-CM | POA: Diagnosis not present

## 2023-06-24 ENCOUNTER — Other Ambulatory Visit: Payer: Self-pay

## 2023-06-24 ENCOUNTER — Encounter: Payer: Self-pay | Admitting: Psychiatry

## 2023-06-24 ENCOUNTER — Other Ambulatory Visit (HOSPITAL_BASED_OUTPATIENT_CLINIC_OR_DEPARTMENT_OTHER): Payer: Self-pay

## 2023-06-24 ENCOUNTER — Telehealth: Payer: Self-pay

## 2023-06-24 ENCOUNTER — Ambulatory Visit: Payer: Medicare Other | Admitting: Psychiatry

## 2023-06-24 DIAGNOSIS — F314 Bipolar disorder, current episode depressed, severe, without psychotic features: Secondary | ICD-10-CM | POA: Diagnosis not present

## 2023-06-24 DIAGNOSIS — F5105 Insomnia due to other mental disorder: Secondary | ICD-10-CM

## 2023-06-24 DIAGNOSIS — F061 Catatonic disorder due to known physiological condition: Secondary | ICD-10-CM | POA: Diagnosis not present

## 2023-06-24 DIAGNOSIS — F411 Generalized anxiety disorder: Secondary | ICD-10-CM | POA: Diagnosis not present

## 2023-06-24 DIAGNOSIS — R7989 Other specified abnormal findings of blood chemistry: Secondary | ICD-10-CM

## 2023-06-24 DIAGNOSIS — G250 Essential tremor: Secondary | ICD-10-CM

## 2023-06-24 DIAGNOSIS — R413 Other amnesia: Secondary | ICD-10-CM | POA: Diagnosis not present

## 2023-06-24 MED ORDER — METHYLPHENIDATE HCL ER (OSM) 18 MG PO TBCR
18.0000 mg | EXTENDED_RELEASE_TABLET | Freq: Every day | ORAL | 0 refills | Status: DC
Start: 2023-06-24 — End: 2023-07-19
  Filled 2023-06-24 – 2023-06-27 (×2): qty 30, 30d supply, fill #0

## 2023-06-24 MED ORDER — CLONAZEPAM 0.5 MG PO TABS
1.0000 mg | ORAL_TABLET | Freq: Two times a day (BID) | ORAL | 0 refills | Status: DC
Start: 2023-06-24 — End: 2023-08-01
  Filled 2023-06-24: qty 180, 45d supply, fill #0

## 2023-06-24 NOTE — Patient Instructions (Signed)
Increase clonazepam to 0.5 mg twice daily Start Concerta 18 mg AM

## 2023-06-24 NOTE — Telephone Encounter (Signed)
Prior Approval received from [facility] for Methlyphenidate 18 mg CR effective 06/24/2023-06/23/2024

## 2023-06-24 NOTE — Progress Notes (Signed)
Meghan Oliver [id number] 09-03-1941 82 y.o.  Subjective:   Patient ID:  Meghan Oliver is a 82 y.o. (DOB 1941/04/08) female. Patient was last seen August 21, 2018 Chief Complaint:  Chief Complaint  Patient presents with   Follow-up   Depression   Memory Loss   Anxiety   Fatigue     Meghan Oliver presents to the office today for follow-up of Severe TR bipolar depression with psychotic features including catatonia.  11/30/2018 was the last visit and the following was noted: Had ECT yesterday at [facility].  Last was Jan 8 and was doing well then.  Next scheduled April 15.  Had a lithium level from there which is pending. Pretty good until the last 10 days to 2 weeks with less energy and motivation.  Bothered by old sick dog.  Worried about how that will affect her.  Has slept with the dog.  Enjoyed [place].  Going to gym and playing [place].  Took the class and didn't feel she caught on quickly. Wonders if it is bc of the ECT memory effects.  Bridge is harder and not playing.  Planning to return to [facility] mid April.   spent the winter in [place] per usual..  No significant depression since here. ECT frequency about 6 weeks.  Gets very anxious the day of the ECT.  ECT consistently for a year.  Best in mood in 7-8 years.  Also the pacemaker helped.  Friends notice the benefit.  Plan:  Option increase in the nortriptyline if needed.  Considered this.  They agree and would like to increase nortriptyline to 75 to try to reduce tendency to depression before the ECT.   03/19/19  Addendum: Here the contents from an email from the nurse for Ms. Stahlman  Hello [name],   I hope you and your family are healthy and well. I took Sylvie to [facility] for ECT and here are labs from 01/26/19. I am going to also forward them to [facility] for follow up with her LFT's. They are still higher than I would like.  Her lithium level was 0.51 on a daily dose of 300mg  qohs x 4 nights- alternating with 450mg  qohs x 3 nights.  She is not demonstrating any signs of elevated levels, minimal hand tremors, less anxiety and restlessness since her ECT treatment.  Her Nortryptyline level was 140 on 75mg  qhs.  I would like to continue her current dosages of both the above meds and recheck her lithium level at her next scheduled ECT of 03/23/19.  Please let me know of any changes you would like to make. Take care, Meghan Loft RN Lithium level was 0.4 on the 450mg  alt with 300 mg QOD. No changes were made in meds.  11/15/2019 phone call: Telephone call from patient's husband [name] on 11/14/2019  Patient is scheduled for ECT soon for maintenance ECT.  He thinks it is been 2-1/2 months since the last ECT but he is going to check the date for sure.  It is still be done being done at [facility].  He is wondering about skipping this treatment because the patient has continued to be free of depression and seems cognitively clearer as these ECT treatments have been spread out further from each other.  Patient remains on medications as prescribed.  If it is truly been over 2 months since the last ECT then it is reasonable to consider discontinuing ECT.  It is unlikely that ECT with the  frequency of greater than 2 months is significantly helpful at preventing her relapse.  If however the ECT frequency is less than every 2 months it could still be helping to prevent recurrence.  He indicated he would consider this information and discuss it with her ECT team and make a decision.  They are heading to [place] in the next few weeks.  Needs to schedule an appointment with me for follow-up because it is been many months.  He agrees.   02/19/2020 appt, the following noted: Moving to [facility] in a few months.  Ready to downsize.   Stopped ECT as discussed and has not been more deprressed.   Walks dog 4 times daily.   Lithium 0.7 on  01/30/20 on lithium 300 mg plus 150 mg on M, W, F Nortriptyline 70 on 75 mg HS. No SE except tremor.  Balance  is not great.  Very happy.   No depression since here.  No mood swings.  Sleep and appetite is OK.  No med changes:  04/03/2020 TC with the following noted: Patient's husband called stating that her tremor was a little worse and she was having some stutter. No evidence of stroke otherwise. First thing would be to check serum lithium level and BMP.  Order written.  Have asked her husband to let us know about the lab to which it should be sent.  04/22/20 TC witht he following noted: Lithium level is not dangerously high but it is has crept up to 1.0 which is higher than desired for her.  Our goal for her is 0.5-0.7.  At the current level it is likely causing side effects so we should reduce the dosage from lithium 300 mg plus 150 mg on M, W, F  TO 300 mg daily.  Meaning drop off the 150 mg capsule.  It will take about 2 weeks for the level to gradually come down to the desired level.  05/08/2020 appt with the following noted: Seen with H and [name] nurse. Moving to [facility] and feels OK about it.  I think I'll be fine.   Pretty good with balance.  Doing yoga and it helps. [name] has cancer. Old dog 3 & 1/2 yo still living. [name]'s concerns when went to [place] for D's BD she had difficulty time with tremor lethargy, more confusion.  Better when go back home. [name] notes using more lorazepam. At times gait is unsteady. Not significantly depressed.  Can be anxious at times.  Eating okay.  Sleeping okay.  Is easily confused under stress. Plan: reduce lithium to 300 mg nightly.  07/14/20 appt with the following noted: Seen with her husband as well as her family nurse. Feeling fine and pleased with that.   Some anxiety over the move to smaller place. [name]'s twin also having heart problems Gene. [name]'s kids came and visited.    Plan: Continue nortriptyline 75 mg, paroxetine 20 mg, lithium 300 mg, Namenda 10 mg twice daily, lorazepam 0.5 mg 1/2-1 3 times daily as needed, mirtazapine 7.5 mg  nightly  02/27/2021 appointment with the following noted:  Seen with H and nurse [name] Received email from nurse Meghan Oliver on 02/24/2021 indicated that he had moved into [facility] living in May.  Stefana has gradually become more depressed and more anxious using lorazepam as prescribed 3 times daily.  More memory issues.  She has remained engaged and initiating appropriate conversation.  She is very worried about her 76 year old dog who is in poor health  and fears she will become more depressed when the dog dies.  She and her husband do not wish to prefer to pursue further ECT unless absolutely necessary. Likes [facility] so far.   No depression by her report.  Sold big house in Feb.  It worked out well.   Worries about her dog 58 yo will die soon. H agrees she's done well with depression.   Enjoys things and has interests. Plan: Continue Lithium 300 mg daily  Continue nortriptyline 75 mg daily Continue paroxetine 20 mg daily Continue lorazepam 0.5 mg 3 times daily for both anxiety and catatonia Continue vitamin B6 and primidone for tremor Cerefolin NAC 2 daily.  05/13/2021 appointment with the following noted: Happy with [facility].  H and she agree is doing well with depression.[place] socialization. She complains of memory.   Family visited and getting along with Dondra Spry better.  H says Dondra Spry is acting better.  Goes to dinner and activities together now.  Better relationship with Gene.. Tremor has been OK.   Ativan usually 0.25 mg TID and tolerated. Anxiety managed with this generally. Molly dog health more stable. To [place] before T[place]giving until April.   Plan: Continue Lithium 300 mg daily  Continue nortriptyline 75 mg daily Continue paroxetine 20 mg daily Continue lorazepam 0.5 mg 3 times daily for both anxiety and catatonia Continue vitamin B6 and primidone for tremor   08/14/2021 appointment with the following noted: Seen with her husband [name] and nurse [name]. Everyone reports that  her mood has been stable.  Her anxiety is manageable with as needed lorazepam.  She is still happy with the transition to [facility].  They are preparing to go to [place] for the winter per usual but may sell their house father there. Tolerating meds without any unusual side effects. Is dealing with grief over the loss of her dog Molly.  03/10/2022 appt noted:  seen with H and nurse Oleta Mouse [facility] house. [name] not good phsyically hard to play golf.  Been really hard. He'll be 85 in July.  Some stress scheduling with D's.   60 th wedding necessary this month.   Mood up and down.  Sister in law across the street can be difficult and demeaning.  She feels unsettled.  Some worry. Some back pain limiting activity.   Sleep is ok except with pain awakening.   No SE, except tremor some worse about 3-5 and helped by lorazepam 0.25 mg then No greater than 1 mg lorazepam daily Patient denies any recent difficulty with anxiety except as noted.  Patient denies difficulty with sleep initiation or maintenance. Denies appetite disturbance.    Patient has some difficulty with concentration.  Patient denies any suicidal ideation.  Usually takes Ativan at night and sleeps well 8 hours.   Plan no med changes  10/23/213 appt noted: Still doing well. Pneumonia and hosp for 6 days 3 weeks ago.  Viral.  Improving at this time. Increased tremor gradually in 3-4 mos helped by lorazepam avg 1.00-1.25 mg daily. No change in primidone. Total R hip August 2023 and done well.  Finishing home PT.   Appetite poor for 7-8 mos.  2 protein drinks daily but may not eat more than 1 good meal a day. [facility]. Substantial memory problems in hospital and in unusual enviornments. Nurse notices decline.  B with LBD passed in August. No pain meds or muscle relaxants beyond pain. No depression.  H note sometimes she has less motivation than others. [name] notes  some normal waxing and waning of mood. Good social interaction.   Not  spending winter in [facility] this year.  D still lives there. Plan: Increase mirtazapine 15 mg HS for appetite.  Also used for sleep and depression.  09/23/22 appt noted:  with nurse and [name] [name] feeling bad with more pain and fatigue.  [name] has CHF.   Made Cayla sad.  Lost a cgood friends.  Rough time.  Mostly worry about [name].  She feels afraid to be alone if something happens.   Not sig depressed.    Just worry . H can't be as active.  Taking a shower is hard.   She's back exercising again.   Went to [employer] for Thanksgiving in [facility] and plan to go back for Christmas. It's hard to travel. Told nurse she was depressed. Appetite is better and once asleep is OK. Using more lorazepam for anxiety 3-4 daily. Plan: Continue Lithium 300 mg daily  Continue nortriptyline 75 mg daily Continue paroxetine 20 mg daily Continue lorazepam 0.5 mg 3 times daily for both anxiety and catatonia Continue vitamin B6 and primidone for tremor Increased and it helped mirtazapine 15 mg HS for appetite.  Also used for sleep and depression.  10/19/22  RTC  [name] reports not doing well.  Went to [facility].  Recent pneumonia.  They are thinking this is aspiration pneurmonia. Lethargic and anxious with tremor. Ruminating.  Hot to cold .  Low tolerance.  Going downhill quickly.  She's verbalizing depression and desire to isolate.  Won't go out.  Started within the last 2 weeks. Usually doesn't complain when she's sick.   [name]'s biggest concern is that Doreatha Martin is having trouble handling this bc she's up at night rambling and not really safe to be up at night.    Will see her 10/22/22 Get lithium and nortriptyline levels.   Meredith Staggers, MD, [facility]     10/22/22 emergency appt with nurse [name] and H [name]: Pneumonia Oct and then again 2nd week January.  It appears to have largely resoved.  It's really bad.  Nausea. Anxious bc [name] ill.  Doesn't want him to leave.  [name] sees anxiety triggering anxiety with little to no appetite.  Hypersensitive to  stimuli like light and noises.  Doesn't want TV on.  [name] says totally reversed in last 6 weeks.    [name] reports she's overemotional.  Friends died. Losing wt 91.4# today. More trouble with sleep. Lorazepam increased to 2 mg daily.  Not sleepy with it.  Doesn't seem to be helpful. Last week neglect hygiene but better this week a little. [name] says STM is poor at times and at times disoriented if in [place].  Will walk in bathroom and wonder where sh is. Barium swallow pending for evaluation of refulex.    10/26/22 RTC:  RTC   No improvement since Friday.  Gradually less engaged.  Can be overstimulated with visitors.   Disc nortriptyline level 115 is a little higher than it was and higher levels can cause SE in elderly.   Reduce nortriptyline to 50 mg HS. Taking lorazepam   Consider low dose of Vraylar or pramipexole.  Or Spravato.    Meredith Staggers, MD, DFAPA     11/15/22 appt noted: Dialogue with Dr. Shirlee Latch cardiologist agreed pt should be stable enough from CV perspective to receive Spravato which could elevate BP.  If that occurs use prn metoprolol 25 mg. Disc in detail with Dr. Corinda Gubler and he agrees  Sink is appropriate  though not FDA approved for bipolar depression.  Disc 2 metanalysis in support of this and this alernative is safer and likely better tolerated than the option of ECT and faster than options of trials of Trintellix, Auvelity, Latuda etc. Tolerating meds.  She is depressed and very anxious and intermittently confused.  Taking clothes on and off bc rapid changes in comfort.  Worried over [employer].  Sleep disrupted appetite some better with mirtazapine. Received Spravato 56 mg first time today and was dissociated.  Resolved over 2 hours.  No HA, NV.  Was distressed moderately and very ambivalent about Tx.  H and nurse supportive of tx plan and for the pt.    11/24/22 appt noted: Current meds: nortriptyline 50 mg HS, paroxetine 20 mg daily, lithium 300 mg nightly, mirtazapine 30 mg  nightly, memantine 10 mg twice daily.  Lorazepam 0.5 mg every 4 to 6 hours as needed catatonia or severe anxiety. Received first dose of Spravato 84 mg today.  She was more noticeably dissociated than with the 56 mg dose.  It did resolve over the 2-hour course of observation although she remained somewhat unsteady and needed to leave via wheelchair.  She did not have headache nausea or vomiting.  She remains ambivalent about the treatment. Husband notes she has continued to be anxious and depressed and was extremely anxious yesterday and somewhat confused.  At times would say things that did not make sense.  He remained supportive of the treatment while she remains ambivalent about the treatment.  Her appetite is good and she is eating well.  Sleep is variable.  H reported at times will be almost normal after receiving an extra dose of lorazepam 0.5 mg prn but this only lasts an hour or so. Plan: Continue Lithium 300 mg daily  Continue nortriptyline 50 mg daily Continue paroxetine 20 mg daily Continue vitamin B6 and primidone for tremor Imirtazapine 30 mg HS for appetite,  used for sleep and depression.  Appetite better Switch lorazepam to [facility] 2 mg AM and use lorazepam 0.5 mg prn.  For catatonia in hopes of more consistent relief.  Decision based on observation of periods of almost normalcy after some BZ doses.  We discussed potential switch of paroxetine and nortriptyline to Auvelity or Trintellix or possibly Jordan.  However these switches will be difficult and it is hoped that the Spravato may provide enough improvement to allow a switch in medicine more tolerable.  11/26/22 appt noted: Current meds: nortriptyline 50 mg HS, paroxetine 20 mg daily, lithium 300 mg nightly, mirtazapine 30 mg nightly, memantine 10 mg twice daily.  Lorazepam 0.5 mg every 4 to 6 hours as needed catatonia or severe anxiety.  Started Loreev 2 mg daily over weekend Received Spravato 84 mg today.   Spravato with expected  dissociation.  She didn't experience relief from negative emotion.  Dissociation resolved over observation period.  No N, V, HA.   Still high anxiety and dread of most everything.  Depressed with negative thought predominates.  H notes periods of confusion and then maybe an hour of clarity sometimes after the dosing of lorazepam.  Then back to severe sx.  Eating is fair. No SI.  Fearful generally.  Needs help with ADLs.  Cannot take meds on her own. Tolerating meds without SE No noticeable effect from Loreev in terms of benefit or SE  12/01/22 appt noted: Received a phone call from nurse [name] this morning to call as soon as possible this patient very  agitated and confused.  Thrashing around in bed.  At times talking about dying out of the fear that she was going to die.  Highly anxious.  Not very cooperative with drinking water or taking medicines this morning. Discussed the situation with her husband as well and we have to options: 1's if we can get her to the office to receive Spravato is scheduled today that may provide some immediate relief and forestall and may be prevent hospitalization.  The second option is to proceed directly to hospitalization.  If she is unable to take nutrition and hydration hospitalization will be necessary. Discussed that appeared to be catatonic hyperactivity and agitation which has not responded to the extended release lorazepam 2 mg a day.  Therefore she was given 1 mg of Ativan this morning and did calm down enough to make it to the Spravato administration this afternoon.  She was able to cooperate with Spravato administration and tolerated it well with typical levels of dissociation which gradually resolved over the 2 hours.  She was able to eat and drink fluids prior to coming to the appointment. Current meds: nortriptyline 50 mg HS, paroxetine 20 mg daily, lithium 300 mg nightly,  memantine 10 mg twice daily.  Lorazepam 0.5 mg every 4 to 6 hours as needed catatonia or  severe anxiety.  Started Loreev 2 mg daily over weekend olanazpine 2.5 mg HS Received Spravato 84 mg today.   Husband notes that after Spravato administration on Monday she was much improved for a period of several hours and was able to have conversations with her family members and make decisions about distributing some of her jewelry and other family decisions.  However over Monday evening into Tuesday she became more agitated and confused and anxious again.  There have been no obvious effects of taking the olanzapine but it does not appear that long-acting lorazepam 2 mg as provided any significant relief. Plan: Continue Lithium 300 mg daily  Continue nortriptyline 50 mg daily Continue paroxetine 20 mg daily Continue vitamin B6 and primidone for tremor DC mirtazapine For catatonia: lorazepam0.5 mg q 4 hour mg daily as tolerated for catatonia with dose spread through the day..  Nurse manages. Restart Loreeve 2 mg daily and use prn lorazepam for catatonia Increase olanzapine 5 mg HS for TRD, anxiety, catatonia, delusions of persecution  12/06/22 appt noted; Psych meds: as above except increase Loreev to 2 mg BID for severe catatonia. nortriptyline 50 mg HS, paroxetine  20 mg daily, lithium 300 mg nightly,  memantine 10 mg twice daily.  Lorazepam 0.5 mg every 4 to 6 hours as needed catatonia or severe anxiety.  Started Loreev 2 mg increased BID for severe catatonia,  increased olanazpine 5 mg HS for 3 days. Received 5th dose of Spravato 84 mg today.    She was able to cooperate with Spravato administration and tolerated it well with typical levels of dissociation which gradually resolved over the 2 hours.  She was able to eat and drink fluids prior to coming to the appointment.   She still feels confused and depressed and anxious. Per family had periods over the weekend where she was confused thinking her H was dead even though he was right in front of her.  Also thinking she is going to die and not  get better.  Is eating and drinking fluids well.  Has needed extra lorazepam to sleep.  Limited periods of lucidity per H. Tolerating high dose lorazepam but limited benefit for catatonia. Plan:  Continue Lithium 300 mg daily  reduce nortriptyline 25 mg daily in anticipation of change Reduce paroxetine 10 mg daily in anticipation of change Continue vitamin B6 and primidone for tremor For catatonia: lorazepam0.5 mg q 4 hour mg daily as tolerated for catatonia with dose spread through the day..  Nurse manages. DT severity of catatonic hyperactivy Loreeve 2 mg BID and use prn lorazepam for catatonia Continue olanzapine 5 mg HS for TRD, anxiety, catatonia, delusions of persecution  12/08/22 appt noted: Psych meds:  Loreev to 2 mg BID for severe catatonia. nortriptyline 25 mg HS, paroxetine 10 mg daily, lithium 300 mg nightly,  memantine 10 mg twice daily.  Lorazepam 0.5 mg every 4 to 6 hours as needed catatonia or severe anxiety.  Started Loreev 2 mg increased BID for severe catatonia,  increased olanazpine 5 mg HS for 3 days. Received 6th dose of Spravato 84 mg today.    She was able to cooperate with Spravato administration and tolerated it well with typical levels of dissociation which gradually resolved over the 2 hours.  She was able to eat and drink fluids prior to coming to the appointment.   She still feels confused and depressed and anxious. Hopeless.  Thoughts she is going to die. Per nurse and H no sig improvement noted with spravato.   No current alcohol problems  01/14/2023 appointment on an urgent basis: Hospitalized due to psychiatry at [facility] from 12/10/2022 until 01/03/2023.  Hospitalized for bipolar disorder severe depression with catatonia.  Received ECT and Latuda 60 mg daily was started.  Stopped paroxetine, lithium, nortriptyline, and mirtazapine.  Lorazepam was switched to clonazepam. Current psych meds clonazepam 0.5 mg twice daily, Latuda 60 mg daily.  Memantine 10 mg twice  daily, weekly ECT rec Wt 101# and better appetite.  Sleep not great but ok.  Will lie awake some.  H says it's pretty good. Alert in the day.  No SE with meds.  Tremor better.   Lorazepam only on ECT days. Plan:  For catatonia: Continue clonazepam 0.5 mg twice daily also for anxiety Continue Latuda 60 mg daily Continue lorazepam 0.5 mg as needed which is currently being used rarely. Recommend continue ECT  weekly as [facility] recommends  02/22/23    [employer] nurse called at 11am.  Nyaisha is doing better, receiving ECT twice this week. Not having breakthrough anxiety now and [name] wants to decrease her morning dose of Klonopin to help decrease her sedation. She wants to give her 1/2 of 0.5mg  Klonopin in the am and just give her regular dose the other two times a day. Is this okay.        Electronically signed by Soundra Pilon L at 02/22/2023 11:21 AM  03/11/23 appt:  Current psych med:  clonazepam 0.25 mg AM and 0.5 mg HS on 5/21, Latuda 60,  Worry anxiety.  Not as happy as those around here. Reduced ECT to weekly does not maintain the benefit.  But they didn't want to continue it.   [name] has  talked with ECT doc about holding off on ECT and missed dose but she's gotten more depressed.   Did twice weekly for awhle and it was beneficial.  Sched for Tuesday Plan: Start Trintellix off label for dep.  Trying to find solution that would allow stopping ECT Recommend continue ECT  but increase to twice weekly as [facility] recommends  04/15/23 appt noted:  with nurse and H Reduced clonazepam to 0.25 mg AM and 0.5  mg HS, Latuda 60, incr Trintellix 10 ECT once or twice weekly.  Took 13 day holiday with ECT and progressively downhill.  Worse if goes more than a week without ECT.   Some interest but not much initiative.  Read paper some.  Can initiate conversation with nurse with some recent details.  Still other memory problems frustrating to everyone.   Some memory px even before ECT but  worse. Sleep good.  Feels good today and appetite ok.   D been at the house lately.  D from [place] No excessive sleep.   She naps some easily in the day.  No sig changes with less clonazepam.  Minimal effect from counseling DT memory. No new concerns.   No SE.    05/20/23 appt noted: Tolerated increase in Trintellix to 15 mg daily.  Reduced clonazepam 0.25 mg AM and 0.375 mg HS. No prn lorazepam.  No dep verbalized since here.  Trouble with sleep.  Intial insomnia.  No caffeine.  Some napping.  Not drowsy now.  Will lay on couch after breakfast.   Not anxious about anything.  Sleep trouble in the last few days. Last ECT a little over 2 weeks to wait another week if possible.  H thinks she is more awake with less ECT.  Will spontaneously seek out paper and fix several. Where do we go from here?   Per H .  Wants her to be able to stop ECT. Nurse notes better exec function.  Nurse has seen some lack of motivation and little spontaneous speech.  Won't engage in conversation much per family.   Still some rumination.   She has no other complaints other than some physical concerns.  Plan: For catatonia: clonazepam to 0.25 mg AM and 0.375 mg HS for 2 weeks then 0.25 mg BID for cog reasons and not currently catatonic Continue Latuda 60 mg daily Continue lorazepam 0.5 mg as needed which is currently being used rarely. Incr Trintellix to 20 mg daily off label for bipolar dep.  Trying to find solution that would allow stopping ECT Recommend continue ECT  but increase to twice weekly as [facility] recommends but considering reduction to every 3 weeks is reasonable.  She is not dep now.  06/24/23 appt noted: urgent appt with H and nurse. Tolerated increase in Trintellix to 20 mg daily.  Reduced clonazepam 0.25 mg AM and 0.375 mg HS. No prn lorazepam, mirtazapine 7.5 mg HS, Latuda 60.  Memantine 10 BID Last eCT Monday every 3-3&1/2 weeks.   Mood and anxiety dipped with time away from ECT.   ECT still helpful.   Away from ECT harder to read and watch TV.   [facility] wants 2 week interval. Minimal initiation of conversation with others.   Not sure what to say in conversation.   General malaise over the last 10 days. Appetite is fair.  D Clydie Braun came last weekend and was helpful.  Not dep today but was before ECT this week.  Meds have not prevented recurrence. H asks about restarting lithium which helped in the past.  ECT-MADRS    Flowsheet Row Clinical Support from 12/08/2022 in [facility]  MADRS Total Score 54      PHQ2-9    Flowsheet Row Office Visit from 01/14/2023 in [facility], MD Office Visit from 10/11/2022 in [facility], MD Office Visit from 08/17/2022 in [facility], MD Office Visit from 07/19/2022 in [facility], MD Office Visit from  03/24/2022 in [facility], MD  PHQ-2 Total Score 4 6 0 0 1  PHQ-9 Total Score 14 -- -- -- --      Flowsheet Row ED to Hosp-Admission (Discharged) from 07/01/2022 in [facility] Admission (Discharged) from 05/18/2022 in [facility] Pre-Admission Testing 60 from 05/10/2022 in [facility]  C-SSRS RISK CATEGORY No Risk No Risk No Risk        Past Psychiatric Medication Trials:  Prozac 40 with lithium and Zyprexa 2.5 to 5 mg,  nortriptyline, paroxetine, Effexor 225 Wellbutrin 300 mirtazapine,  Others  Pramipexole 0.625 daily hallucinations  Rexulti EPS, Abilify 15, Vraylar 1.5 1 week,  Latuda 10 mg SE, Olanzapine 10 briefly Caplyta briefly no response   Lamotrigine level 7.5  Namenda,  lorazepam,  Clonazaam 0.5 BID  no response Spravato after 6 tx of 84 mg and 2 of 56 mg .  Hospitalized at [facility] on psychiatric unit from 12/10/2022 to 01/03/2023 and received ECT and started Latuda 60 mg daily.  Stopped paroxetine, nortriptyline, and lithium.  this list is not exhaustive  Review of Systems:  Review of Systems   Constitutional:  Positive for fatigue. Negative for appetite change.  Cardiovascular:  Negative for palpitations.  Gastrointestinal:  Positive for constipation. Negative for nausea.       Linzess and Mozantic managed   Musculoskeletal:  Positive for gait problem.  Neurological:  Positive for dizziness and weakness. Negative for tremors.       Some balance issues  Psychiatric/Behavioral:  Positive for decreased concentration. Negative for agitation, behavioral problems, confusion, dysphoric mood, hallucinations, self-injury, sleep disturbance and suicidal ideas. The patient is nervous/anxious. The patient is not hyperactive.     Medications: I have reviewed the patient's current medications.  Current Outpatient Medications  Medication Sig Dispense Refill   amLODipine (NORVASC) 10 MG tablet Take 1 tablet (10 mg total) by mouth daily. 90 tablet 1   aspirin 81 MG chewable tablet Chew 1 tablet (81 mg total) by mouth daily.     Calcium Carbonate-Vitamin D (CALCIUM 600/VITAMIN D PO) Take 1 tablet by mouth 2 (two) times daily.     cetirizine (ZYRTEC) 10 MG tablet Take 10 mg by mouth as needed for allergies.     Cholecalciferol (VITAMIN D3) 50 MCG (2000 UT) TABS Take 2,000 Units by mouth in the morning.     cloNIDine (CATAPRES) 0.1 MG tablet Take 1 tablet (0.1 mg total) by mouth as needed prior to ECT treatment 20 tablet 2   COVID-19 mRNA vaccine 2023-2024 (COMIRNATY) syringe Inject into the muscle. 0.3 mL 0   dexlansoprazole (DEXILANT) 60 MG capsule Take 1 capsule by mouth daily 90 capsule 3   fesoterodine (TOVIAZ) 8 MG TB24 tablet Take 1 tablet (8 mg total) by mouth daily. 90 tablet 3   glycerin adult 2 g suppository Use 1 suppository as needed if you have not had a bowel movement in  4 to 5 days 25 suppository 0   hydrochlorothiazide (HYDRODIURIL) 12.5 MG tablet Take 1 tablet (12.5 mg total) by mouth daily. 90 tablet 1   influenza vaccine adjuvanted (FLUAD QUADRIVALENT) 0.5 ML injection Inject  into the muscle. 0.5 mL 0   Krill Oil 500 MG CAPS Take 500 mg by mouth daily.     lactulose (CHRONULAC) 10 GM/15ML solution Take 30 mLs (20 g total) by mouth 3 (three) times daily between meals as needed for mild constipation. (Patient taking differently: Take 20  g by mouth daily as needed for mild constipation.) 473 mL 3   Lavender Oil 80 MG CAPS Take 160 mg by mouth at bedtime.     levothyroxine (SYNTHROID) 75 MCG tablet Take 1 tablet (75 mcg total) by mouth daily. 90 tablet 2   levothyroxine (SYNTHROID) 75 MCG tablet Take 1 tablet (75 mcg total) by mouth daily. 90 tablet 2   levothyroxine (SYNTHROID) 75 MCG tablet Take 1 tablet (75 mcg total) by mouth daily. 90 tablet 2   linaclotide (LINZESS) 72 MCG capsule Take 2 capsules (144 mcg total) by mouth daily before breakfast. 180 capsule 1   LORazepam (ATIVAN) 0.5 MG tablet TAKE 2 TABLETS (1 MG)  BY MOUTH EVERY NIGHT AT BEDTIME AND 1 TABLET EVERY 4 HOURS AS NEEDED FOR ANXIETY 270 tablet 0   losartan (COZAAR) 100 MG tablet Take 1 tablet (100 mg total) by mouth daily. 90 tablet 1   Lurasidone HCl 60 MG TABS Take 1 tablet (60 mg total) by mouth daily after supper. 90 tablet 0   magic mouthwash w/lidocaine SOLN Take 5 mLs by mouth 3 (three) times daily as needed for mouth pain. (Patient taking differently: Take 5 mLs by mouth as needed for mouth pain.) 120 mL 0   memantine (NAMENDA) 10 MG tablet Take 1 tablet (10 mg total) by mouth 2 (two) times daily. 180 tablet 1   methylphenidate 18 MG PO CR tablet Take 1 tablet (18 mg total) by mouth daily. 30 tablet 0   metoprolol tartrate (LOPRESSOR) 25 MG tablet Take 1 tablet (25 mg total) by mouth as needed. 45 tablet 3   mirtazapine (REMERON) 7.5 MG tablet Take 1 tablet (7.5 mg total) by mouth at bedtime. 30 tablet 1   Multiple Vitamins-Minerals (MULTIVITAMIN WITH MINERALS) tablet Take 1 tablet by mouth daily.     Multiple Vitamins-Minerals (PRESERVISION AREDS 2 PO) Take 1 capsule by mouth in the morning and at  bedtime.     naloxegol oxalate (MOVANTIK) 12.5 MG TABS tablet Take 2 tablets (25 mg total) by mouth daily. (Patient taking differently: Take 25 mg by mouth daily. PRN) 60 tablet 3   neomycin-polymyxin b-dexamethasone (MAXITROL) 3.5-10000-0.1 OINT Apply thin line (1 cm) of ointment to right eye twice daily for 1 week. 3.5 g 1   ofloxacin (OCUFLOX) 0.3 % ophthalmic solution Instill 1 drop into right eye four times daily for 7 days. 5 mL 1   omeprazole-sodium bicarbonate (ZEGERID) 40-1100 MG capsule Take 1 capsule by mouth daily before breakfast. 90 capsule 3   ondansetron (ZOFRAN-ODT) 4 MG disintegrating tablet Place 1 tablet every 6 hours by translingual route as needed. (Patient taking differently: Take by mouth as needed.) 20 tablet 0   pneumococcal 20-valent conjugate vaccine (PREVNAR 20) 0.5 ML injection Inject into the muscle. 0.5 mL 0   polyethylene glycol (MIRALAX / GLYCOLAX) 17 g packet Take 17 g by mouth as needed for mild constipation.     RSV vaccine recomb adjuvanted (AREXVY) 120 MCG/0.5ML injection Inject into the muscle. 0.5 mL 0   sennosides-docusate sodium (SENOKOT-S) 8.6-50 MG tablet Take 2 tablets by mouth as needed.     thiamine (VITAMIN B-1) 100 MG tablet Take 1 tablet (100 mg total) by mouth daily. 100 tablet 0   valACYclovir (VALTREX) 1000 MG tablet Take 2 tablets by mouth at onset, then 2 tablets 12 hours later. (Patient taking differently: Take 2,000 mg by mouth as needed. PRn) 30 tablet 1   vitamin B-12 (CYANOCOBALAMIN) 1000 MCG tablet Take  1,000 mcg by mouth at bedtime.     vortioxetine HBr (TRINTELLIX) 20 MG TABS tablet Take 1 tablet (20 mg total) by mouth daily. 30 tablet 1   clonazePAM (KLONOPIN) 0.5 MG tablet Take 2 tablets (1 mg total) by mouth 2 (two) times daily. 180 tablet 0   No current facility-administered medications for this visit.    Medication Side Effects: Other: tremor  stable.  No worse.  Allergies:  Allergies  Allergen Reactions   Propranolol Other  (See Comments)    Low blood pressure. Bradycardia    Brexpiprazole Other (See Comments)    Aphasia and catatonia    Past Medical History:  Diagnosis Date   Anxiety    Arthritis    "hips, spine" (03/17/2018)   Bipolar II disorder (HCC)    CHF (congestive heart failure) (HCC)    Chronic bronchitis (HCC)    Chronic lower back pain    Chronic right hip pain    CKD (chronic kidney disease), stage II    Coronary artery disease    stent x1   Esophagitis, erosive    GAD (generalized anxiety disorder)    GERD (gastroesophageal reflux disease)    Headache    "maybe monthly" (03/17/2018))   Heart murmur, systolic    History of adenomatous polyp of colon    08-04-2016  tubular adenoma   History of blood transfusion 12/2017   "related to vascular hematoma"   History of electroconvulsive therapy    at [facility]--  started 04-15-2015 to 11-17-2016  total greater than 40 times   History of hiatal hernia    Hyperlipidemia    Hypertension    Hypothyroidism    Internal carotid artery stenosis, bilateral    per last duplex 05-01-2014  bilateral ICA 40-59%   Major depression, chronic    ECT treatments extensive and multiple started 07/ 2016   Memory loss    "both short and long-term; needs frequent reminders to follow instrucitons" (05/16/2017)   Migraines    "none in years" (03/17/2018)   OSA (obstructive sleep apnea)    per study 06/ 2012 moderate OSA  ; "refuses to wear masks" (03/17/2018)   Osteoporosis    Pneumonia 07/29/2022   Poor historian    due to short term memory loss   Presence of permanent cardiac pacemaker 03/17/2018   Pulmonary nodule    monitored by pcp   S/P placement of cardiac pacemaker 03/17/18 [facility]  03/18/2018   Short-term memory loss    Sick sinus syndrome (HCC)     Family History  Problem Relation Age of Onset   Heart attack Father 70       deceased   Hypertension Father    Heart disease Father    Dementia Brother    Parkinson's disease Brother    Heart  disease Brother    Breast cancer Paternal Aunt        Age 59's   Breast cancer Paternal Grandmother        Age unknown   Colon cancer Neg Hx     Social History   Socioeconomic History   Marital status: Married    Spouse name: Dr. Victorino Dike   Number of children: 2   Years of education: Not on file   Highest education level: Not on file  Occupational History   Occupation: housewife    Employer: UNEMPLOYED  Tobacco Use   Smoking status: Former    Current packs/day: 0.00    Average packs/day: 2.0  packs/day for 15.0 years (30.0 ttl pk-yrs)    Types: Cigarettes    Start date: 01/13/1956    Quit date: 01/13/1971    Years since quitting: 52.4    Passive exposure: Never   Smokeless tobacco: Never  Vaping Use   Vaping status: Never Used  Substance and Sexual Activity   Alcohol use: Yes    Comment: occassional 1 x a week   Drug use: Never   Sexual activity: Not Currently    Comment: intercourse age 13, sexual partners less than 5  Other Topics Concern   Not on file  Social History Narrative   Right handed   Social Determinants of Health   Financial Resource Strain: Low Risk  (12/12/2022)   Received from [facility], [facility]   Overall Financial Resource Strain (CARDIA)    Difficulty of Paying Living Expenses: Not hard at all  Food Insecurity: No Food Insecurity (12/12/2022)   Received from [facility], [facility]   Hunger Vital Sign    Worried About Running Out of Food in the Last Year: Never true    Ran Out of Food in the Last Year: Never true  Transportation Needs: No Transportation Needs (12/12/2022)   Received from [facility], [facility]   [facility] - Transportation    In the past 12 months, has lack of transportation kept you from medical appointments or from getting medications?: No    Lack of Transportation (Non-Medical): No  Physical Activity: Not on file   Stress: Not on file  Social Connections: Not on file  Intimate Partner Violence: Not At Risk (07/06/2022)   Humiliation, Afraid, Rape, and Kick questionnaire    Fear of Current or Ex-Partner: No    Emotionally Abused: No    Physically Abused: No    Sexually Abused: No    Past Medical History, Surgical history, Social history, and Family history were reviewed and updated as appropriate.   Please see review of systems for further details on the patient's review from today.   Objective:   Physical Exam:  LMP  (LMP Unknown)   Physical Exam Constitutional:      General: She is not in acute distress.    Appearance: She is well-developed.  Musculoskeletal:        General: No deformity.  Neurological:     Mental Status: She is alert and oriented to person, place, and time.     Motor: No tremor.     Coordination: Coordination abnormal.     Comments: Cane use  Psychiatric:        Attention and Perception: She is attentive.        Mood and Affect: Mood is anxious. Mood is not depressed. Affect is not labile, blunt or inappropriate.        Speech: Speech is not slurred.        Behavior: Behavior normal. Behavior is not slowed, withdrawn or combative.        Thought Content: Thought content is not delusional. Thought content does not include homicidal or suicidal ideation. Thought content does not include suicidal plan.        Cognition and Memory: Cognition is impaired. She exhibits impaired recent memory.        Judgment: Judgment normal.     Comments: Insight fair to good. No auditory or visual hallucinations.  Depression resolved.  and anxiety are remarkably improved Delusional guilt not elicited Affect pleasant  and smiling .      Lab Review:     Component Value Date/Time   NA 138 01/14/2023 1242   NA 137 12/29/2022 0000   K 4.0 01/14/2023 1242   CL 103 01/14/2023 1242   CO2 23 01/14/2023 1242   GLUCOSE 87 01/14/2023 1242   BUN 19 01/14/2023 1242   BUN 12 12/29/2022  0000   CREATININE 0.68 01/14/2023 1242   CALCIUM 8.4 (L) 01/14/2023 1242   PROT 6.9 11/11/2022 1106   PROT 6.2 04/20/2017 1151   ALBUMIN 3.9 09/02/2022 1221   ALBUMIN 3.9 04/20/2017 1151   AST 44 (H) 11/11/2022 1106   ALT 86 (H) 11/11/2022 1106   ALKPHOS 64 09/02/2022 1221   BILITOT 0.2 11/11/2022 1106   BILITOT <0.2 04/20/2017 1151   GFRNONAA >60 07/08/2022 0429   GFRNONAA 50 (L) 07/10/2020 1021   GFRAA 58 (L) 07/10/2020 1021       Component Value Date/Time   WBC 6.3 12/29/2022 0000   WBC 6.6 11/11/2022 1106   RBC 3.88 11/11/2022 1106   HGB 10.3 (A) 12/29/2022 0000   HGB 10.2 (L) 04/20/2017 1151   HCT 32 (A) 12/29/2022 0000   HCT 29.6 (L) 04/20/2017 1151   PLT 231 12/29/2022 0000   PLT 385 (H) 04/20/2017 1151   MCV 93.6 11/11/2022 1106   MCV 89 04/20/2017 1151   MCH 31.2 11/11/2022 1106   MCHC 33.3 11/11/2022 1106   RDW 13.7 11/11/2022 1106   RDW 13.9 04/20/2017 1151   LYMPHSABS 1,439 11/11/2022 1106   LYMPHSABS 1.5 04/20/2017 1151   MONOABS 0.7 07/07/2022 0458   EOSABS 112 11/11/2022 1106   EOSABS 0.3 04/20/2017 1151   BASOSABS 53 11/11/2022 1106   BASOSABS 0.0 04/20/2017 1151    Lithium Lvl  Date Value Ref Range Status  10/21/2022 0.5 (L) 0.6 - 1.2 mmol/L Final  10/21/2022 lithium level 0.5 on 300 mg nightly is stable.,  Nortriptyline 115 on 75 mg HS.  08/02/22 nortriptyline 85, lihtium 0.5  Nortriptyline level 51 on 50 mg nightly with paroxetine 20 mg daily  07/10/2020 lithium level 0.6 at [facility] office and nortriptyline level 130 on the current dosages of lithium 300 mg nightly and nortriptyline 75 mg nightly  02/20/2021 labs lithium 0.6 on 300 mg nightly.  Nortriptyline level 49 which is lower than expected on 75 mg nightly along with paroxetine 20 mg daily.    No results found for: "PHENYTOIN", "PHENOBARB", "VALPROATE", "CBMZ"   .res Assessment: Plan:    Severe bipolar I disorder, current or most recent episode depressed, with catatonia (HCC)  - Plan: methylphenidate 18 MG PO CR tablet, clonazePAM (KLONOPIN) 0.5 MG tablet  Generalized anxiety disorder - Plan: clonazePAM (KLONOPIN) 0.5 MG tablet  Low vitamin D level  Catatonia - Plan: clonazePAM (KLONOPIN) 0.5 MG tablet  Essential tremor  Insomnia due to mental condition  Memory loss - Plan: methylphenidate 18 MG PO CR tablet    History of lithium toxicity repeatedly while on chlorthalidone. No tremor px off of the lithium.    50-minute urgent visit today with H and nurse present..  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  Recent decompensation and rapidly getting worse with some cognive impairment and mild psychotic sx and altered mental status. Her case has been highly complex and treatment resistant at times.  No current catatonia but is somewhat anxious easily.  And worse with reduction of clonazepam without improvement in cog with reduction. So will increase it back  to 0.5 mg BID  She had remarkedly improved with the ECT + change in psychiatric medicine to Latuda 60 mg daily with clonazepam 0.5 mg twice daily. But they would like to get her meds to the point she could stop ECT bc cog problems which are ongoing.  She is not ruminative.  Affect is bright.  Dep resolved including anhedonia largely with ECT.  Catatonia is resolved .  Cog is better with skipping a couple of weeks of ECT but is not normal with STM px and not very talkative socially  she can recall events of the last few days..   She is still anxious and somewhat insecure.  Short-term memory impairment is consistent with what would be expected with the ECT at this time.  options of trials of Trintellix, Auvelity, etc. Reviewed the old chart and consider very low-dose Vraylar    From records it appears her last episode of ECT was in September 2020.   Previously Disc Dr. Blossom Hoops concerns about longterm ECT on cognition.  Disc therapy for her  through Dr. Terrall Laity  3-4 times.  Didn't seem to  help.  Seeing another person at [facility].  Trouble with memory interferes with some of the benefit.  For catatonia and anxiety increase clonazepam to 0.5 mg AM and PM bc anxiety worse and cog not better with reduction Continue Latuda 60 mg daily Continue lorazepam 0.5 mg as needed which is currently being used rarely. continue Trintellix to 20 mg daily off label for bipolar dep.  Trying to find solution that would allow stopping ECT  Trial off label Concerta lowest dose 18 mg AM for TRD and cog problems Discussed potential benefits, risks, and side effects of stimulants with patient to include increased heart rate, palpitations, insomnia, increased anxiety, increased irritability, or decreased appetite or mania, agitation.   Instructed patient to contact office if experiencing any significant tolerability issues.  Discussed potential metabolic side effects associated with atypical antipsychotics, as well as potential risk for movement side effects. Advised pt to contact office if movement side effects occur.   Recommend continue ECT  but increase to twice weekly as [facility] recommends.  She is not dep now but it tends to recur between ECT.  Disc option of return to lithium bc it helped in the past  but does have more potential SE bc had tremor with it before.  FU 4 weeks  Meredith Staggers MD, DFAPA Please see After Visit Summary for patient specific instructions.  Future Appointments  Date Time Provider Department Center  07/04/2023  3:20 PM [facility] DEVICE REMOTES [facility]  07/25/2023  1:00 PM Cottle, Steva Ready., MD [facility] None  08/09/2023 11:00 AM Cottle, Steva Ready., MD [facility] None  10/03/2023  7:00 AM [facility] DEVICE REMOTES [facility]  01/02/2024  7:00 AM [facility] DEVICE REMOTES [facility]  04/02/2024  7:00 AM [facility] DEVICE REMOTES [facility]  07/02/2024  7:00 AM [facility] DEVICE REMOTES [facility]   10/01/2024  7:00 AM [facility] DEVICE REMOTES [facility]  12/31/2024  7:00 AM [facility] DEVICE REMOTES [facility]  04/01/2025  7:00 AM [facility] DEVICE REMOTES [facility]  07/01/2025  7:00 AM [facility] DEVICE REMOTES [facility]  09/30/2025  7:00 AM [facility] DEVICE REMOTES [facility]      No orders of the defined types were placed in this encounter.      -------------------------------

## 2023-06-27 ENCOUNTER — Other Ambulatory Visit (HOSPITAL_BASED_OUTPATIENT_CLINIC_OR_DEPARTMENT_OTHER): Payer: Self-pay

## 2023-06-27 ENCOUNTER — Ambulatory Visit (INDEPENDENT_AMBULATORY_CARE_PROVIDER_SITE_OTHER): Payer: Medicare Other | Admitting: Internal Medicine

## 2023-06-27 ENCOUNTER — Ambulatory Visit (HOSPITAL_BASED_OUTPATIENT_CLINIC_OR_DEPARTMENT_OTHER)
Admission: RE | Admit: 2023-06-27 | Discharge: 2023-06-27 | Disposition: A | Discharge status: Home / Self Care | Payer: Medicare Other | Source: Ambulatory Visit | Attending: Internal Medicine | Admitting: Internal Medicine

## 2023-06-27 ENCOUNTER — Telehealth: Payer: Self-pay | Admitting: Internal Medicine

## 2023-06-27 VITALS — BP 110/60 | HR 60 | Temp 99.0°F | Wt 98.0 lb

## 2023-06-27 DIAGNOSIS — R5381 Other malaise: Secondary | ICD-10-CM | POA: Diagnosis not present

## 2023-06-27 DIAGNOSIS — R5383 Other fatigue: Secondary | ICD-10-CM

## 2023-06-27 DIAGNOSIS — E039 Hypothyroidism, unspecified: Secondary | ICD-10-CM | POA: Diagnosis not present

## 2023-06-27 DIAGNOSIS — R413 Other amnesia: Secondary | ICD-10-CM | POA: Diagnosis not present

## 2023-06-27 DIAGNOSIS — R82998 Other abnormal findings in urine: Secondary | ICD-10-CM | POA: Diagnosis not present

## 2023-06-27 DIAGNOSIS — F3132 Bipolar disorder, current episode depressed, moderate: Secondary | ICD-10-CM

## 2023-06-27 DIAGNOSIS — E7800 Pure hypercholesterolemia, unspecified: Secondary | ICD-10-CM

## 2023-06-27 DIAGNOSIS — I10 Essential (primary) hypertension: Secondary | ICD-10-CM

## 2023-06-27 DIAGNOSIS — R590 Localized enlarged lymph nodes: Secondary | ICD-10-CM

## 2023-06-27 DIAGNOSIS — R768 Other specified abnormal immunological findings in serum: Secondary | ICD-10-CM | POA: Diagnosis not present

## 2023-06-27 DIAGNOSIS — Z950 Presence of cardiac pacemaker: Secondary | ICD-10-CM | POA: Diagnosis not present

## 2023-06-27 LAB — POCT URINALYSIS DIP (CLINITEK)
Bilirubin, UA: NEGATIVE
Blood, UA: NEGATIVE
Glucose, UA: NEGATIVE mg/dL
Ketones, POC UA: NEGATIVE mg/dL
Nitrite, UA: NEGATIVE
POC PROTEIN,UA: NEGATIVE
Spec Grav, UA: 1.01 (ref 1.010–1.025)
Urobilinogen, UA: 0.2 E.U./dL
pH, UA: 6 (ref 5.0–8.0)

## 2023-06-27 NOTE — Progress Notes (Signed)
Patient Care Team: Margaree Mackintosh, MD as PCP - General (Internal Medicine) Laurey Morale, MD as PCP - Cardiology (Cardiology) Marinus Maw, MD as PCP - Electrophysiology (Cardiology) Herby Abraham, MD (Cardiology) Cottle, Steva Ready., MD (Psychiatry) Trellis Paganini, MD (Inactive) (Obstetrics and Gynecology)  Visit Date: 06/27/23  Subjective:    Patient ID: Meghan Oliver , Female   DOB: 12-29-1940, 82 y.o.    MRN: [id number]   82 y.o. Female presents today for malaise/fatigue, swollen areas in anterior neck since 06/06/23. Denies cough, congestion. Myalgias in shoulders are especially painful. She has a trainer who sees her twice weekly. Walks regularly. She has lost 3 pounds since 01/14/23.  History of Bipolar I disorder. She recently started ECT with last treatment on 06/17/23. This is supposed to be repeated every two weeks. She has felt increasingly depressed recently with lack of motivation. Appetite normal. She has felt increased sensitivity to hot/cold exposure recently.  She takes a multivitamin and other supplements.  Past Medical History:  Diagnosis Date   Anxiety    Arthritis    "hips, spine" (03/17/2018)   Bipolar II disorder (HCC)    CHF (congestive heart failure) (HCC)    Chronic bronchitis (HCC)    Chronic lower back pain    Chronic right hip pain    CKD (chronic kidney disease), stage II    Coronary artery disease    stent x1   Esophagitis, erosive    GAD (generalized anxiety disorder)    GERD (gastroesophageal reflux disease)    Headache    "maybe monthly" (03/17/2018))   Heart murmur, systolic    History of adenomatous polyp of colon    08-04-2016  tubular adenoma   History of blood transfusion 12/2017   "related to vascular hematoma"   History of electroconvulsive therapy    at [facility]--  started 04-15-2015 to 11-17-2016  total greater than 40 times   History of hiatal hernia    Hyperlipidemia    Hypertension    Hypothyroidism    Internal  carotid artery stenosis, bilateral    per last duplex 05-01-2014  bilateral ICA 40-59%   Major depression, chronic    ECT treatments extensive and multiple started 07/ 2016   Memory loss    "both short and long-term; needs frequent reminders to follow instrucitons" (05/16/2017)   Migraines    "none in years" (03/17/2018)   OSA (obstructive sleep apnea)    per study 06/ 2012 moderate OSA  ; "refuses to wear masks" (03/17/2018)   Osteoporosis    Pneumonia 07/29/2022   Poor historian    due to short term memory loss   Presence of permanent cardiac pacemaker 03/17/2018   Pulmonary nodule    monitored by pcp   S/P placement of cardiac pacemaker 03/17/18 [facility]  03/18/2018   Short-term memory loss    Sick sinus syndrome (HCC)      Family History  Problem Relation Age of Onset   Heart attack Father 10       deceased   Hypertension Father    Heart disease Father    Dementia Brother    Parkinson's disease Brother    Heart disease Brother    Breast cancer Paternal Aunt        Age 81's   Breast cancer Paternal Grandmother        Age unknown   Colon cancer Neg Hx     Social History   Social History  Narrative   Right handed      Married to Dr. Victorino Dike. Resides at [facility]. Has adult children who reside out of town   Review of Systems  Constitutional:  Positive for malaise/fatigue. Negative for fever.  HENT:  Negative for congestion.   Eyes:  Negative for blurred vision.  Respiratory:  Negative for cough and shortness of breath.   Cardiovascular:  Negative for chest pain, palpitations and leg swelling.  Gastrointestinal:  Negative for vomiting.  Musculoskeletal:  Negative for back pain.  Skin:  Negative for rash.       (+) Swollen areas anterior neck  Neurological:  Negative for loss of consciousness and headaches.  Psychiatric/Behavioral:  Positive for depression.         Objective:   Vitals: BP 110/60   Pulse 60   Temp 99 F (37.2 C)   Wt 98 lb (44.5 kg)    LMP  (LMP Unknown)   SpO2 97%   BMI 19.79 kg/m    Physical Exam Vitals and nursing note reviewed.  Constitutional:      General: She is not in acute distress.    Appearance: Normal appearance. She is not toxic-appearing.  HENT:     Head: Normocephalic and atraumatic.  Neck:     Vascular: No carotid bruit.     Comments: 2.0 cm nodule left anterior cervical neck and 1.0 cm nodule right anterior cervical neck- firm. Cardiovascular:     Rate and Rhythm: Normal rate and regular rhythm. No extrasystoles are present.    Pulses: Normal pulses.     Heart sounds: Murmur heard.     Systolic murmur is present with a grade of 1/6.     No friction rub. No gallop.  Pulmonary:     Effort: Pulmonary effort is normal. No respiratory distress.     Breath sounds: Normal breath sounds. No wheezing or rales.  Skin:    General: Skin is warm and dry.  Neurological:     Mental Status: She is alert and oriented to person, place, and time. Mental status is at baseline.  Psychiatric:        Mood and Affect: Mood normal. Affect is flat.        Behavior: Behavior normal.        Thought Content: Thought content normal.        Judgment: Judgment normal.       Results:   Studies obtained and personally reviewed by me:   Labs:       Component Value Date/Time   NA 138 01/14/2023 1242   NA 137 12/29/2022 0000   K 4.0 01/14/2023 1242   CL 103 01/14/2023 1242   CO2 23 01/14/2023 1242   GLUCOSE 87 01/14/2023 1242   BUN 19 01/14/2023 1242   BUN 12 12/29/2022 0000   CREATININE 0.68 01/14/2023 1242   CALCIUM 8.4 (L) 01/14/2023 1242   PROT 6.9 11/11/2022 1106   PROT 6.2 04/20/2017 1151   ALBUMIN 3.9 09/02/2022 1221   ALBUMIN 3.9 04/20/2017 1151   AST 44 (H) 11/11/2022 1106   ALT 86 (H) 11/11/2022 1106   ALKPHOS 64 09/02/2022 1221   BILITOT 0.2 11/11/2022 1106   BILITOT <0.2 04/20/2017 1151   GFRNONAA >60 07/08/2022 0429   GFRNONAA 50 (L) 07/10/2020 1021   GFRAA 58 (L) 07/10/2020 1021      Lab Results  Component Value Date   WBC 6.3 12/29/2022   HGB 10.3 (A) 12/29/2022   HCT 32 (A)  12/29/2022   MCV 93.6 11/11/2022   PLT 231 12/29/2022    Lab Results  Component Value Date   CHOL 211 (H) 03/05/2022   HDL 118 03/05/2022   LDLCALC 79 03/05/2022   LDLDIRECT 98.2 04/10/2007   TRIG 49 03/05/2022   CHOLHDL 1.8 03/05/2022    Lab Results  Component Value Date   HGBA1C 5.1 02/15/2011     Lab Results  Component Value Date   TSH 1.58 07/20/2022      Assessment & Plan:   Myalgias- etiology unclear. Rheumatology labs drawn with results pending  Right and left cervical nodes: etiology unclear. Ordered CMET, CBC with Diff/Plat, sed rate, cyclic citral peptide antibody. Ordered CXR. Will contact with results.  Hx of UTI- urine checked  Hx of depression treated with ECT at [facility]- pt is pleasant, quiet ,and cooperative    I,Alexander Ruley,acting as a scribe for Margaree Mackintosh, MD.,have documented all relevant documentation on the behalf of Margaree Mackintosh, MD,as directed by  Margaree Mackintosh, MD while in the presence of Margaree Mackintosh, MD.   I, Margaree Mackintosh, MD, have reviewed all documentation for this visit. The documentation on 07/01/23 for the exam, diagnosis, procedures, and orders are all accurate and complete.

## 2023-06-27 NOTE — Telephone Encounter (Signed)
[employer], [phone]  [name] called to say that Meghan Oliver has body aches, just feels lousy, no other symptoms and Dr Corinda Gubler felt a enlarged lymph node in her neck. They would like for you to see her.

## 2023-06-27 NOTE — Telephone Encounter (Signed)
scheduled

## 2023-06-27 NOTE — Telephone Encounter (Signed)
COVID Test negative

## 2023-06-27 NOTE — Telephone Encounter (Signed)
Called and ask [name] to do a COVID test on Meghan Oliver, she is going to do that and call back.

## 2023-06-27 NOTE — Patient Instructions (Signed)
CXR to be obtained at [facility] facility. Labs drawn and pending. It was a pleasure to see you today. We will be in touch with test results.

## 2023-06-28 LAB — URINE CULTURE
MICRO NUMBER:: 15502372
Result:: NO GROWTH
SPECIMEN QUALITY:: ADEQUATE

## 2023-06-29 ENCOUNTER — Other Ambulatory Visit: Payer: Self-pay

## 2023-06-29 DIAGNOSIS — R768 Other specified abnormal immunological findings in serum: Secondary | ICD-10-CM

## 2023-06-30 DIAGNOSIS — F319 Bipolar disorder, unspecified: Secondary | ICD-10-CM | POA: Diagnosis not present

## 2023-06-30 DIAGNOSIS — F419 Anxiety disorder, unspecified: Secondary | ICD-10-CM | POA: Diagnosis not present

## 2023-06-30 DIAGNOSIS — N189 Chronic kidney disease, unspecified: Secondary | ICD-10-CM | POA: Diagnosis not present

## 2023-06-30 DIAGNOSIS — F3130 Bipolar disorder, current episode depressed, mild or moderate severity, unspecified: Secondary | ICD-10-CM | POA: Diagnosis not present

## 2023-06-30 DIAGNOSIS — I5032 Chronic diastolic (congestive) heart failure: Secondary | ICD-10-CM | POA: Diagnosis not present

## 2023-06-30 DIAGNOSIS — G4730 Sleep apnea, unspecified: Secondary | ICD-10-CM | POA: Diagnosis not present

## 2023-06-30 DIAGNOSIS — I2510 Atherosclerotic heart disease of native coronary artery without angina pectoris: Secondary | ICD-10-CM | POA: Diagnosis not present

## 2023-06-30 DIAGNOSIS — K219 Gastro-esophageal reflux disease without esophagitis: Secondary | ICD-10-CM | POA: Diagnosis not present

## 2023-06-30 DIAGNOSIS — E039 Hypothyroidism, unspecified: Secondary | ICD-10-CM | POA: Diagnosis not present

## 2023-06-30 LAB — CBC WITH DIFFERENTIAL/PLATELET
Absolute Monocytes: 854 cells/uL (ref 200–950)
Basophils Absolute: 42 cells/uL (ref 0–200)
Basophils Relative: 0.6 %
Eosinophils Absolute: 91 cells/uL (ref 15–500)
Eosinophils Relative: 1.3 %
HCT: 37.7 % (ref 35.0–45.0)
Hemoglobin: 12 g/dL (ref 11.7–15.5)
Lymphs Abs: 2310 cells/uL (ref 850–3900)
MCH: 29.1 pg (ref 27.0–33.0)
MCHC: 31.8 g/dL — ABNORMAL LOW (ref 32.0–36.0)
MCV: 91.3 fL (ref 80.0–100.0)
MPV: 11 fL (ref 7.5–12.5)
Monocytes Relative: 12.2 %
Neutro Abs: 3703 cells/uL (ref 1500–7800)
Neutrophils Relative %: 52.9 %
Platelets: 241 10*3/uL (ref 140–400)
RBC: 4.13 10*6/uL (ref 3.80–5.10)
RDW: 14.5 % (ref 11.0–15.0)
Total Lymphocyte: 33 %
WBC: 7 10*3/uL (ref 3.8–10.8)

## 2023-06-30 LAB — CYCLIC CITRUL PEPTIDE ANTIBODY, IGG: Cyclic Citrullin Peptide Ab: <16 UNITS

## 2023-06-30 LAB — COMPLETE METABOLIC PANEL WITH GFR
AG Ratio: 1.5 (calc) (ref 1.0–2.5)
ALT: 17 U/L (ref 6–29)
AST: 18 U/L (ref 10–35)
Albumin: 4.1 g/dL (ref 3.6–5.1)
Alkaline phosphatase (APISO): 51 U/L (ref 37–153)
BUN: 20 mg/dL (ref 7–25)
CO2: 22 mmol/L (ref 20–32)
Calcium: 9.5 mg/dL (ref 8.6–10.4)
Chloride: 102 mmol/L (ref 98–110)
Creat: 0.77 mg/dL (ref 0.60–0.95)
Globulin: 2.7 g/dL (calc) (ref 1.9–3.7)
Glucose, Bld: 63 mg/dL — ABNORMAL LOW (ref 65–99)
Potassium: 3.8 mmol/L (ref 3.5–5.3)
Sodium: 139 mmol/L (ref 135–146)
Total Bilirubin: 0.4 mg/dL (ref 0.2–1.2)
Total Protein: 6.8 g/dL (ref 6.1–8.1)
eGFR: 76 mL/min/{1.73_m2} (ref >=60)

## 2023-06-30 LAB — TEST AUTHORIZATION

## 2023-06-30 LAB — ANTI-NUCLEAR AB-TITER (ANA TITER): ANA Titer 1: 1:80 {titer} — ABNORMAL HIGH

## 2023-06-30 LAB — ANA: Anti Nuclear Antibody (ANA): POSITIVE — AB

## 2023-06-30 LAB — ANTI-SMITH ANTIBODY: ENA SM Ab Ser-aCnc: <1 AI

## 2023-06-30 LAB — ANTI-DNA ANTIBODY, DOUBLE-STRANDED: ds DNA Ab: 1 IU/mL

## 2023-06-30 LAB — SEDIMENTATION RATE: Sed Rate: 2 mm/h (ref 0–30)

## 2023-07-01 ENCOUNTER — Encounter: Payer: Self-pay | Admitting: Internal Medicine

## 2023-07-02 ENCOUNTER — Other Ambulatory Visit (HOSPITAL_BASED_OUTPATIENT_CLINIC_OR_DEPARTMENT_OTHER): Payer: Self-pay

## 2023-07-05 ENCOUNTER — Other Ambulatory Visit: Payer: Self-pay | Admitting: Internal Medicine

## 2023-07-05 ENCOUNTER — Other Ambulatory Visit: Payer: Self-pay | Admitting: Psychiatry

## 2023-07-05 DIAGNOSIS — F3132 Bipolar disorder, current episode depressed, moderate: Secondary | ICD-10-CM

## 2023-07-05 DIAGNOSIS — F411 Generalized anxiety disorder: Secondary | ICD-10-CM

## 2023-07-05 DIAGNOSIS — H353211 Exudative age-related macular degeneration, right eye, with active choroidal neovascularization: Secondary | ICD-10-CM | POA: Diagnosis not present

## 2023-07-05 DIAGNOSIS — F5105 Insomnia due to other mental disorder: Secondary | ICD-10-CM

## 2023-07-05 DIAGNOSIS — F061 Catatonic disorder due to known physiological condition: Secondary | ICD-10-CM

## 2023-07-06 ENCOUNTER — Other Ambulatory Visit (HOSPITAL_BASED_OUTPATIENT_CLINIC_OR_DEPARTMENT_OTHER): Payer: Self-pay

## 2023-07-06 ENCOUNTER — Other Ambulatory Visit: Payer: Self-pay

## 2023-07-06 DIAGNOSIS — N189 Chronic kidney disease, unspecified: Secondary | ICD-10-CM | POA: Diagnosis not present

## 2023-07-06 DIAGNOSIS — I2510 Atherosclerotic heart disease of native coronary artery without angina pectoris: Secondary | ICD-10-CM | POA: Diagnosis not present

## 2023-07-06 DIAGNOSIS — G4730 Sleep apnea, unspecified: Secondary | ICD-10-CM | POA: Diagnosis not present

## 2023-07-06 DIAGNOSIS — I5032 Chronic diastolic (congestive) heart failure: Secondary | ICD-10-CM | POA: Diagnosis not present

## 2023-07-06 DIAGNOSIS — F332 Major depressive disorder, recurrent severe without psychotic features: Secondary | ICD-10-CM | POA: Diagnosis not present

## 2023-07-06 DIAGNOSIS — E039 Hypothyroidism, unspecified: Secondary | ICD-10-CM | POA: Diagnosis not present

## 2023-07-06 DIAGNOSIS — F419 Anxiety disorder, unspecified: Secondary | ICD-10-CM | POA: Diagnosis not present

## 2023-07-06 MED ORDER — VITAMIN B-1 100 MG PO TABS
100.0000 mg | ORAL_TABLET | Freq: Every day | ORAL | 3 refills | Status: DC
  Filled 2023-07-06: qty 100, 100d supply, fill #0
  Filled 2023-09-07: qty 100, 100d supply, fill #1
  Filled 2023-12-28: qty 100, 100d supply, fill #2
  Filled 2024-03-25 – 2024-04-02 (×2): qty 100, 100d supply, fill #3

## 2023-07-06 MED ORDER — LOSARTAN POTASSIUM 100 MG PO TABS
100.0000 mg | ORAL_TABLET | Freq: Every day | ORAL | 1 refills | Status: DC
  Filled 2023-07-06: qty 90, 90d supply, fill #0
  Filled 2023-10-16: qty 90, 90d supply, fill #1

## 2023-07-06 MED ORDER — PRIMIDONE 50 MG PO TABS
50.0000 mg | ORAL_TABLET | Freq: Two times a day (BID) | ORAL | 5 refills | Status: DC
Start: 2023-07-06 — End: 2023-07-25
  Filled 2023-07-06: qty 90, 30d supply, fill #0
  Filled 2023-07-19: qty 90, 30d supply, fill #1

## 2023-07-06 MED ORDER — MIRTAZAPINE 7.5 MG PO TABS
7.5000 mg | ORAL_TABLET | Freq: Every day | ORAL | 0 refills | Status: DC
  Filled 2023-07-06: qty 90, 90d supply, fill #0

## 2023-07-06 MED ORDER — VORTIOXETINE HBR 20 MG PO TABS
20.0000 mg | ORAL_TABLET | Freq: Every day | ORAL | 0 refills | Status: DC
Start: 2023-07-06 — End: 2023-07-19
  Filled 2023-07-06: qty 90, 90d supply, fill #0

## 2023-07-06 NOTE — Telephone Encounter (Signed)
Rf's appropriate; LF 05/20/23 (both medications); LV 06/24/23; NV 07/25/23;

## 2023-07-06 NOTE — Telephone Encounter (Signed)
I agree fill appropriate but not the additional RF bc will be back in a couple of weeks.

## 2023-07-09 ENCOUNTER — Other Ambulatory Visit (HOSPITAL_BASED_OUTPATIENT_CLINIC_OR_DEPARTMENT_OTHER): Payer: Self-pay

## 2023-07-12 ENCOUNTER — Other Ambulatory Visit (HOSPITAL_BASED_OUTPATIENT_CLINIC_OR_DEPARTMENT_OTHER): Payer: Self-pay

## 2023-07-12 DIAGNOSIS — N189 Chronic kidney disease, unspecified: Secondary | ICD-10-CM | POA: Diagnosis not present

## 2023-07-12 DIAGNOSIS — E039 Hypothyroidism, unspecified: Secondary | ICD-10-CM | POA: Diagnosis not present

## 2023-07-12 DIAGNOSIS — G4730 Sleep apnea, unspecified: Secondary | ICD-10-CM | POA: Diagnosis not present

## 2023-07-12 DIAGNOSIS — F3113 Bipolar disorder, current episode manic without psychotic features, severe: Secondary | ICD-10-CM | POA: Diagnosis not present

## 2023-07-12 DIAGNOSIS — J449 Chronic obstructive pulmonary disease, unspecified: Secondary | ICD-10-CM | POA: Diagnosis not present

## 2023-07-12 DIAGNOSIS — F419 Anxiety disorder, unspecified: Secondary | ICD-10-CM | POA: Diagnosis not present

## 2023-07-12 DIAGNOSIS — I2510 Atherosclerotic heart disease of native coronary artery without angina pectoris: Secondary | ICD-10-CM | POA: Diagnosis not present

## 2023-07-14 ENCOUNTER — Other Ambulatory Visit (HOSPITAL_BASED_OUTPATIENT_CLINIC_OR_DEPARTMENT_OTHER): Payer: Self-pay

## 2023-07-14 MED ORDER — AMOXICILLIN 500 MG PO CAPS
2000.0000 mg | ORAL_CAPSULE | Freq: Once | ORAL | 2 refills | Status: AC
  Filled 2023-07-14: qty 12, 3d supply, fill #0
  Filled 2023-07-15: qty 12, 3d supply, fill #1
  Filled 2023-07-22 – 2023-07-23 (×2): qty 12, 3d supply, fill #2

## 2023-07-15 ENCOUNTER — Other Ambulatory Visit (HOSPITAL_BASED_OUTPATIENT_CLINIC_OR_DEPARTMENT_OTHER): Payer: Self-pay

## 2023-07-15 ENCOUNTER — Other Ambulatory Visit: Payer: Self-pay

## 2023-07-19 ENCOUNTER — Other Ambulatory Visit (HOSPITAL_BASED_OUTPATIENT_CLINIC_OR_DEPARTMENT_OTHER): Payer: Self-pay

## 2023-07-19 ENCOUNTER — Other Ambulatory Visit: Payer: Self-pay | Admitting: Gastroenterology

## 2023-07-19 ENCOUNTER — Other Ambulatory Visit: Payer: Self-pay | Admitting: Internal Medicine

## 2023-07-19 ENCOUNTER — Other Ambulatory Visit: Payer: Self-pay | Admitting: Psychiatry

## 2023-07-19 ENCOUNTER — Other Ambulatory Visit: Payer: Self-pay

## 2023-07-19 DIAGNOSIS — F419 Anxiety disorder, unspecified: Secondary | ICD-10-CM | POA: Diagnosis not present

## 2023-07-19 DIAGNOSIS — F332 Major depressive disorder, recurrent severe without psychotic features: Secondary | ICD-10-CM | POA: Diagnosis not present

## 2023-07-19 DIAGNOSIS — N189 Chronic kidney disease, unspecified: Secondary | ICD-10-CM | POA: Diagnosis not present

## 2023-07-19 DIAGNOSIS — F061 Catatonic disorder due to known physiological condition: Secondary | ICD-10-CM

## 2023-07-19 DIAGNOSIS — I129 Hypertensive chronic kidney disease with stage 1 through stage 4 chronic kidney disease, or unspecified chronic kidney disease: Secondary | ICD-10-CM | POA: Diagnosis not present

## 2023-07-19 DIAGNOSIS — E039 Hypothyroidism, unspecified: Secondary | ICD-10-CM | POA: Diagnosis not present

## 2023-07-19 DIAGNOSIS — F5105 Insomnia due to other mental disorder: Secondary | ICD-10-CM

## 2023-07-19 DIAGNOSIS — K219 Gastro-esophageal reflux disease without esophagitis: Secondary | ICD-10-CM | POA: Diagnosis not present

## 2023-07-19 DIAGNOSIS — F411 Generalized anxiety disorder: Secondary | ICD-10-CM

## 2023-07-19 DIAGNOSIS — I5032 Chronic diastolic (congestive) heart failure: Secondary | ICD-10-CM | POA: Diagnosis not present

## 2023-07-19 DIAGNOSIS — I110 Hypertensive heart disease with heart failure: Secondary | ICD-10-CM | POA: Diagnosis not present

## 2023-07-19 DIAGNOSIS — I2510 Atherosclerotic heart disease of native coronary artery without angina pectoris: Secondary | ICD-10-CM | POA: Diagnosis not present

## 2023-07-19 DIAGNOSIS — R413 Other amnesia: Secondary | ICD-10-CM

## 2023-07-19 DIAGNOSIS — G3184 Mild cognitive impairment, so stated: Secondary | ICD-10-CM

## 2023-07-19 DIAGNOSIS — J449 Chronic obstructive pulmonary disease, unspecified: Secondary | ICD-10-CM | POA: Diagnosis not present

## 2023-07-19 DIAGNOSIS — G4730 Sleep apnea, unspecified: Secondary | ICD-10-CM | POA: Diagnosis not present

## 2023-07-19 MED ORDER — MEMANTINE HCL 10 MG PO TABS
10.0000 mg | ORAL_TABLET | Freq: Two times a day (BID) | ORAL | 1 refills | Status: DC
  Filled 2023-07-19 – 2023-09-24 (×2): qty 180, 90d supply, fill #0
  Filled 2023-12-28: qty 180, 90d supply, fill #1

## 2023-07-19 MED ORDER — MIRTAZAPINE 7.5 MG PO TABS
7.5000 mg | ORAL_TABLET | Freq: Every day | ORAL | 0 refills | Status: DC
  Filled 2023-07-19 – 2023-09-24 (×2): qty 90, 90d supply, fill #0

## 2023-07-19 MED ORDER — LINACLOTIDE 72 MCG PO CAPS
72.0000 ug | ORAL_CAPSULE | Freq: Every day | ORAL | 0 refills | Status: DC
  Filled 2023-07-19 – 2023-07-22 (×4): qty 180, 180d supply, fill #0
  Filled 2023-07-23: qty 90, 90d supply, fill #0
  Filled 2023-08-03: qty 180, 180d supply, fill #0
  Filled 2023-08-04 – 2023-09-07 (×2): qty 90, 90d supply, fill #0
  Filled 2023-11-16: qty 90, 90d supply, fill #1

## 2023-07-19 MED ORDER — AMLODIPINE BESYLATE 10 MG PO TABS
10.0000 mg | ORAL_TABLET | Freq: Every day | ORAL | 1 refills | Status: DC
  Filled 2023-07-19 – 2023-09-07 (×2): qty 90, 90d supply, fill #0
  Filled 2023-11-16 – 2023-11-25 (×2): qty 90, 90d supply, fill #1

## 2023-07-19 MED ORDER — HYDROCHLOROTHIAZIDE 12.5 MG PO TABS
12.5000 mg | ORAL_TABLET | Freq: Every day | ORAL | 1 refills | Status: DC
  Filled 2023-07-19: qty 90, 90d supply, fill #0
  Filled 2023-10-16: qty 90, 90d supply, fill #1

## 2023-07-19 MED ORDER — METHYLPHENIDATE HCL ER (OSM) 18 MG PO TBCR
18.0000 mg | EXTENDED_RELEASE_TABLET | Freq: Every day | ORAL | 0 refills | Status: DC
  Filled 2023-07-19: qty 30, 30d supply, fill #0

## 2023-07-19 MED ORDER — VORTIOXETINE HBR 20 MG PO TABS
20.0000 mg | ORAL_TABLET | Freq: Every day | ORAL | 0 refills | Status: DC
Start: 2023-07-19 — End: 2023-11-16
  Filled 2023-07-19 – 2023-09-24 (×2): qty 90, 90d supply, fill #0

## 2023-07-19 NOTE — Telephone Encounter (Signed)
Rf's inappropriate; Rf's memantine 10/5; methylphenidate 09/23; mirtazapine 10/5; vortioxetine 10/5; nv 10/21; lv 09/20

## 2023-07-20 ENCOUNTER — Other Ambulatory Visit (HOSPITAL_BASED_OUTPATIENT_CLINIC_OR_DEPARTMENT_OTHER): Payer: Self-pay

## 2023-07-21 ENCOUNTER — Other Ambulatory Visit: Payer: Self-pay

## 2023-07-21 ENCOUNTER — Other Ambulatory Visit (HOSPITAL_BASED_OUTPATIENT_CLINIC_OR_DEPARTMENT_OTHER): Payer: Self-pay

## 2023-07-22 ENCOUNTER — Other Ambulatory Visit (HOSPITAL_BASED_OUTPATIENT_CLINIC_OR_DEPARTMENT_OTHER): Payer: Self-pay

## 2023-07-22 ENCOUNTER — Other Ambulatory Visit: Payer: Self-pay

## 2023-07-23 ENCOUNTER — Other Ambulatory Visit (HOSPITAL_BASED_OUTPATIENT_CLINIC_OR_DEPARTMENT_OTHER): Payer: Self-pay

## 2023-07-25 ENCOUNTER — Other Ambulatory Visit (HOSPITAL_BASED_OUTPATIENT_CLINIC_OR_DEPARTMENT_OTHER): Payer: Self-pay

## 2023-07-25 ENCOUNTER — Ambulatory Visit: Payer: Medicare Other | Admitting: Psychiatry

## 2023-07-25 ENCOUNTER — Other Ambulatory Visit: Payer: Self-pay

## 2023-07-25 ENCOUNTER — Encounter: Payer: Self-pay | Admitting: Psychiatry

## 2023-07-25 DIAGNOSIS — R413 Other amnesia: Secondary | ICD-10-CM

## 2023-07-25 DIAGNOSIS — F3132 Bipolar disorder, current episode depressed, moderate: Secondary | ICD-10-CM | POA: Diagnosis not present

## 2023-07-25 DIAGNOSIS — F411 Generalized anxiety disorder: Secondary | ICD-10-CM

## 2023-07-25 DIAGNOSIS — F061 Catatonic disorder due to known physiological condition: Secondary | ICD-10-CM

## 2023-07-25 DIAGNOSIS — F5105 Insomnia due to other mental disorder: Secondary | ICD-10-CM

## 2023-07-25 DIAGNOSIS — G3184 Mild cognitive impairment, so stated: Secondary | ICD-10-CM

## 2023-07-25 DIAGNOSIS — F314 Bipolar disorder, current episode depressed, severe, without psychotic features: Secondary | ICD-10-CM

## 2023-07-25 MED ORDER — LURASIDONE HCL 60 MG PO TABS
1.0000 | ORAL_TABLET | Freq: Every day | ORAL | 0 refills | Status: DC
Start: 2023-07-25 — End: 2023-11-16
  Filled 2023-07-25 – 2023-08-16 (×2): qty 30, 30d supply, fill #0
  Filled 2023-09-07 – 2023-09-24 (×3): qty 30, 30d supply, fill #1
  Filled 2023-10-20: qty 30, 30d supply, fill #2

## 2023-07-25 MED ORDER — PRIMIDONE 50 MG PO TABS
50.0000 mg | ORAL_TABLET | Freq: Two times a day (BID) | ORAL | 5 refills | Status: DC
Start: 2023-07-25 — End: 2023-08-01
  Filled 2023-07-25: qty 90, 30d supply, fill #0

## 2023-07-25 MED ORDER — METHYLPHENIDATE HCL ER (OSM) 27 MG PO TBCR
27.0000 mg | EXTENDED_RELEASE_TABLET | Freq: Every day | ORAL | 0 refills | Status: DC
  Filled 2023-07-25: qty 90, 90d supply, fill #0

## 2023-07-25 NOTE — Progress Notes (Addendum)
Meghan Oliver [id number] [birth year] 82 y.o.  Subjective:   Patient ID:  Meghan Oliver is a 82 y.o. (DOB 1941/04/28) female. 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  11/30/2018 was the last visit and the following was noted: Had ECT yesterday at [facility].  Last was Jan 8 and was doing well then.  Next scheduled April 15.  Had a lithium level from there which is pending. Pretty good until the last 10 days to 2 weeks with less energy and motivation.  Bothered by old sick dog.  Worried about how that will affect her.  Has slept with the dog.  [place]ed [place].  Going to gym and playing [place].  Took the class and didn't feel she caught on quickly. Wonders if it is bc of the ECT memory effects.  Bridge is harder and not playing.  Planning to return to [facility] mid April.   spent the winter in [place] per usual..  No significant depression since here. ECT frequency about 6 weeks.  Gets very anxious the day of the ECT.  ECT consistently for a year.  Best in mood in 7-8 years.  Also the pacemaker helped.  Friends notice the benefit.  Plan:  Option increase in the nortriptyline if needed.  Considered this.  They agree and would like to increase nortriptyline to 75 to try to reduce tendency to depression before the ECT.   03/19/19  Addendum: Here the contents from an email from the nurse for [name]  Hello [name],   I hope you and your family are healthy and well. I took [name] to [facility] for ECT and here are labs from 01/26/19. I am going to also forward them to [facility] for follow up with her LFT's. They are still higher than I would like.  Her lithium level was 0.51 on a daily dose of 300mg  qohs x 4 nights- alternating with 450mg  qohs x 3 nights. She is not demonstrating any  signs of elevated levels, minimal hand tremors, less anxiety and restlessness since her ECT treatment.  Her Nortryptyline level was 140 on 75mg  qhs.  I would like to continue her current dosages of both the above meds and recheck her lithium level at her next scheduled ECT of 03/23/19.  Please let me know of any changes you would like to make. Take care, [name] Lithium level was 0.4 on the 450mg  alt with 300 mg QOD. No changes were made in meds.  11/15/2019 phone call: Telephone call from patient's husband [name] on 11/14/2019  Patient is scheduled for ECT soon for maintenance ECT.  He thinks it is been 2-1/2 months since the last ECT but he is going to check the date for sure.  It is still be done being done at [facility].  He is wondering about skipping this treatment because the patient has continued to be free of depression and seems cognitively clearer as these ECT treatments have been spread out further from each other.  Patient remains on medications as prescribed.  If it is truly been over 2 months since the last ECT then it is reasonable to consider discontinuing ECT.  It is unlikely that ECT with the frequency of greater than 2 months  is significantly helpful at preventing her relapse.  If however the ECT frequency is less than every 2 months it could still be helping to prevent recurrence.  He indicated he would consider this information and discuss it with her ECT team and make a decision.  They are heading to [place] in the next few weeks.  Needs to schedule an appointment with me for follow-up because it is been many months.  He agrees.   02/19/2020 appt, the following noted: Moving to [facility] in a few months.  Ready to downsize.   Stopped ECT as discussed and has not been more deprressed.   Walks dog 4 times daily.   Lithium 0.7 on  01/30/20 on lithium 300 mg plus 150 mg on M, W, F Nortriptyline 70 on 75 mg HS. No SE except tremor.  Balance is not great.  Very happy.    No depression since here.  No mood swings.  Sleep and appetite is OK.  No med changes:  04/03/2020 TC with the following noted: Patient's husband called stating that her tremor was a little worse and she was having some stutter. No evidence of stroke otherwise. First thing would be to check serum lithium level and BMP.  Order written.  Have asked her husband to let us know about the lab to which it should be sent.  04/22/20 TC witht he following noted: Lithium level is not dangerously high but it is has crept up to 1.0 which is higher than desired for her.  Our goal for her is 0.5-0.7.  At the current level it is likely causing side effects so we should reduce the dosage from lithium 300 mg plus 150 mg on M, W, F  TO 300 mg daily.  Meaning drop off the 150 mg capsule.  It will take about 2 weeks for the level to gradually come down to the desired level.  05/08/2020 appt with the following noted: Seen with H and [name] nurse. Moving to [facility] and feels OK about it.  I think I'll be fine.   Pretty good with balance.  Doing yoga and it helps. [name] has cancer. Old dog 41 & 1/2 yo still living. [name]'s concerns when went to [place] for D's BD she had difficulty time with tremor lethargy, more confusion.  Better when go back home. [name] notes using more lorazepam. At times gait is unsteady. Not significantly depressed.  Can be anxious at times.  Eating okay.  Sleeping okay.  Is easily confused under stress. Plan: reduce lithium to 300 mg nightly.  07/14/20 appt with the following noted: Seen with her husband as well as her family nurse. Feeling fine and pleased with that.   Some anxiety over the move to smaller place. [name]'s twin also having heart problems Gene. [name]'s kids came and visited.    Plan: Continue nortriptyline 75 mg, paroxetine 20 mg, lithium 300 mg, Namenda 10 mg twice daily, lorazepam 0.5 mg 1/2-1 3 times daily as needed, mirtazapine 7.5 mg nightly  02/27/2021 appointment with the  following noted:  Seen with H and nurse [name] Received email from nurse [name] on 02/24/2021 indicated that he had moved into [facility] living in May.  Azora has gradually become more depressed and more anxious using lorazepam as prescribed 3 times daily.  More memory issues.  She has remained engaged and initiating appropriate conversation.  She is very worried about her 57 year old dog who is in poor health and fears she will become more  depressed when the dog dies.  She and her husband do not wish to prefer to pursue further ECT unless absolutely necessary. Likes [facility] so far.   No depression by her report.  Sold big house in Feb.  It worked out well.   Worries about her dog 49 yo will die soon. H agrees she's done well with depression.   Enjoys things and has interests. Plan: Continue Lithium 300 mg daily  Continue nortriptyline 75 mg daily Continue paroxetine 20 mg daily Continue lorazepam 0.5 mg 3 times daily for both anxiety and catatonia Continue vitamin B6 and primidone for tremor Cerefolin NAC 2 daily.  05/13/2021 appointment with the following noted: Happy with [facility].  H and she agree is doing well with depression.[place] socialization. She complains of memory.   Family visited and getting along with Dondra Spry better.  H says Dondra Spry is acting better.  Goes to dinner and activities together now.  Better relationship with Gene.. Tremor has been OK.   Ativan usually 0.25 mg TID and tolerated. Anxiety managed with this generally. Molly dog health more stable. To [place] before T'giving until April.   Plan: Continue Lithium 300 mg daily  Continue nortriptyline 75 mg daily Continue paroxetine 20 mg daily Continue lorazepam 0.5 mg 3 times daily for both anxiety and catatonia Continue vitamin B6 and primidone for tremor   08/14/2021 appointment with the following noted: Seen with her husband [name] and nurse [name]. Everyone reports that her mood has been stable.  Her anxiety is  manageable with as needed lorazepam.  She is still happy with the transition to [facility].  They are preparing to go to [place] for the winter per usual but may sell their house father there. Tolerating meds without any unusual side effects. Is dealing with grief over the loss of her dog Molly.  03/10/2022 appt noted:  seen with H and nurse Oleta Mouse [facility] house. [name] not good phsyically hard to play golf.  Been really hard. He'll be 85 in July.  Some stress scheduling with D's.   60 th wedding necessary this month.   Mood up and down.  Sister in law across the street can be difficult and demeaning.  She feels unsettled.  Some worry. Some back pain limiting activity.   Sleep is ok except with pain awakening.   No SE, except tremor some worse about 3-5 and helped by lorazepam 0.25 mg then No greater than 1 mg lorazepam daily Patient denies any recent difficulty with anxiety except as noted.  Patient denies difficulty with sleep initiation or maintenance. Denies appetite disturbance.    Patient has some difficulty with concentration.  Patient denies any suicidal ideation.  Usually takes Ativan at night and sleeps well 8 hours.   Plan no med changes  10/23/213 appt noted: Still doing well. Pneumonia and hosp for 6 days 3 weeks ago.  Viral.  Improving at this time. Increased tremor gradually in 3-4 mos helped by lorazepam avg 1.00-1.25 mg daily. No change in primidone. Total R hip August 2023 and done well.  Finishing home PT.   Appetite poor for 7-8 mos.  2 protein drinks daily but may not eat more than 1 good meal a day. [facility]. Substantial memory problems in hospital and in unusual enviornments. Nurse notices decline.  B with LBD passed in August. No pain meds or muscle relaxants beyond pain. No depression.  H note sometimes she has less motivation than others. [name] notes some normal waxing and waning of  mood. Good social interaction.   Not spending winter in [facility] this year.  D still lives  there. Plan: Increase mirtazapine 15 mg HS for appetite.  Also used for sleep and depression.  09/23/22 appt noted:  with nurse and [name] [name] feeling bad with more pain and fatigue.  [name] has CHF.   Made [place] sad.  Lost a cgood friends.  Rough time.  Mostly worry about [name].  She feels afraid to be alone if something happens.   Not sig depressed.    Just worry . H can't be as active.  Taking a shower is hard.   She's back exercising again.   Went to [employer] for Thanksgiving in [facility] and plan to go back for Christmas. It's hard to travel. Told nurse she was depressed. Appetite is better and once asleep is OK. Using more lorazepam for anxiety 3-4 daily. Plan: Continue Lithium 300 mg daily  Continue nortriptyline 75 mg daily Continue paroxetine 20 mg daily Continue lorazepam 0.5 mg 3 times daily for both anxiety and catatonia Continue vitamin B6 and primidone for tremor Increased and it helped mirtazapine 15 mg HS for appetite.  Also used for sleep and depression.  10/19/22  RTC  [name] reports not doing well.  Went to [facility].  Recent pneumonia.  They are thinking this is aspiration pneurmonia. Lethargic and anxious with tremor. Ruminating.  Hot to cold .  Low tolerance.  Going downhill quickly.  She's verbalizing depression and desire to isolate.  Won't go out.  Started within the last 2 weeks. Usually doesn't complain when she's sick.   [name]'s biggest concern is that Doreatha Martin is having trouble handling this bc she's up at night rambling and not really safe to be up at night.    Will see her 10/22/22 Get lithium and nortriptyline levels.   Meredith Staggers, MD, [facility]     10/22/22 emergency appt with nurse [name] and H [name]: Pneumonia Oct and then again 2nd week January.  It appears to have largely resoved.  It's really bad.  Nausea. Anxious bc [name] ill.  Doesn't want him to leave.  [name] sees anxiety triggering anxiety with little to no appetite.  Hypersensitive to stimuli like light and noises.  Doesn't want TV on.   [name] says totally reversed in last 6 weeks.    [name] reports she's overemotional.  Friends died. Losing wt 91.4# today. More trouble with sleep. Lorazepam increased to 2 mg daily.  Not sleepy with it.  Doesn't seem to be helpful. Last week neglect hygiene but better this week a little. [name] says STM is poor at times and at times disoriented if in [place].  Will walk in bathroom and wonder where sh is. Barium swallow pending for evaluation of refulex.    10/26/22 RTC:  RTC   No improvement since Friday.  Gradually less engaged.  Can be overstimulated with visitors.   Disc nortriptyline level 115 is a little higher than it was and higher levels can cause SE in elderly.   Reduce nortriptyline to 50 mg HS. Taking lorazepam   Consider low dose of Vraylar or pramipexole.  Or Spravato.    Meredith Staggers, MD, DFAPA     11/15/22 appt noted: Dialogue with Dr. Shirlee Latch cardiologist agreed pt should be stable enough from CV perspective to receive Spravato which could elevate BP.  If that occurs use prn metoprolol 25 mg. Disc in detail with Dr. Corinda Gubler and he agrees  Sink is appropriate though not FDA approved for bipolar  depression.  Disc 2 metanalysis in support of this and this alernative is safer and likely better tolerated than the option of ECT and faster than options of trials of Trintellix, Auvelity, Latuda etc. Tolerating meds.  She is depressed and very anxious and intermittently confused.  Taking clothes on and off bc rapid changes in comfort.  Worried over [employer].  Sleep disrupted appetite some better with mirtazapine. Received Spravato 56 mg first time today and was dissociated.  Resolved over 2 hours.  No HA, NV.  Was distressed moderately and very ambivalent about Tx.  H and nurse supportive of tx plan and for the pt.    11/24/22 appt noted: Current meds: nortriptyline 50 mg HS, paroxetine 20 mg daily, lithium 300 mg nightly, mirtazapine 30 mg nightly, memantine 10 mg twice daily.  Lorazepam 0.5  mg every 4 to 6 hours as needed catatonia or severe anxiety. Received first dose of Spravato 84 mg today.  She was more noticeably dissociated than with the 56 mg dose.  It did resolve over the 2-hour course of observation although she remained somewhat unsteady and needed to leave via wheelchair.  She did not have headache nausea or vomiting.  She remains ambivalent about the treatment. Husband notes she has continued to be anxious and depressed and was extremely anxious yesterday and somewhat confused.  At times would say things that did not make sense.  He remained supportive of the treatment while she remains ambivalent about the treatment.  Her appetite is good and she is eating well.  Sleep is variable.  H reported at times will be almost normal after receiving an extra dose of lorazepam 0.5 mg prn but this only lasts an hour or so. Plan: Continue Lithium 300 mg daily  Continue nortriptyline 50 mg daily Continue paroxetine 20 mg daily Continue vitamin B6 and primidone for tremor Imirtazapine 30 mg HS for appetite,  used for sleep and depression.  Appetite better Switch lorazepam to [facility] ER 2 mg AM and use lorazepam 0.5 mg prn.  For catatonia in hopes of more consistent relief.  Decision based on observation of periods of almost normalcy after some BZ doses.  We discussed potential switch of paroxetine and nortriptyline to Auvelity or Trintellix or possibly Jordan.  However these switches will be difficult and it is hoped that the Spravato may provide enough improvement to allow a switch in medicine more tolerable.  11/26/22 appt noted: Current meds: nortriptyline 50 mg HS, paroxetine 20 mg daily, lithium 300 mg nightly, mirtazapine 30 mg nightly, memantine 10 mg twice daily.  Lorazepam 0.5 mg every 4 to 6 hours as needed catatonia or severe anxiety.  Started Loreev 2 mg daily over weekend Received Spravato 84 mg today.   Spravato with expected dissociation.  She didn't experience relief from  negative emotion.  Dissociation resolved over observation period.  No N, V, HA.   Still high anxiety and dread of most everything.  Depressed with negative thought predominates.  H notes periods of confusion and then maybe an hour of clarity sometimes after the dosing of lorazepam.  Then back to severe sx.  Eating is fair. No SI.  Fearful generally.  Needs help with ADLs.  Cannot take meds on her own. Tolerating meds without SE No noticeable effect from Loreev in terms of benefit or SE  12/01/22 appt noted: Received a phone call from nurse [name] this morning to call as soon as possible this patient very agitated and confused.  Thrashing around  in bed.  At times talking about dying out of the fear that she was going to die.  Highly anxious.  Not very cooperative with drinking water or taking medicines this morning. Discussed the situation with her husband as well and we have to options: 1's if we can get her to the office to receive Spravato is scheduled today that may provide some immediate relief and forestall and may be prevent hospitalization.  The second option is to proceed directly to hospitalization.  If she is unable to take nutrition and hydration hospitalization will be necessary. Discussed that appeared to be catatonic hyperactivity and agitation which has not responded to the extended release lorazepam 2 mg a day.  Therefore she was given 1 mg of Ativan this morning and did calm down enough to make it to the Spravato administration this afternoon.  She was able to cooperate with Spravato administration and tolerated it well with typical levels of dissociation which gradually resolved over the 2 hours.  She was able to eat and drink fluids prior to coming to the appointment. Current meds: nortriptyline 50 mg HS, paroxetine 20 mg daily, lithium 300 mg nightly,  memantine 10 mg twice daily.  Lorazepam 0.5 mg every 4 to 6 hours as needed catatonia or severe anxiety.  Started Loreev 2 mg daily over  weekend olanazpine 2.5 mg HS Received Spravato 84 mg today.   Husband notes that after Spravato administration on Monday she was much improved for a period of several hours and was able to have conversations with her family members and make decisions about distributing some of her jewelry and other family decisions.  However over Monday evening into Tuesday she became more agitated and confused and anxious again.  There have been no obvious effects of taking the olanzapine but it does not appear that long-acting lorazepam 2 mg as provided any significant relief. Plan: Continue Lithium 300 mg daily  Continue nortriptyline 50 mg daily Continue paroxetine 20 mg daily Continue vitamin B6 and primidone for tremor DC mirtazapine For catatonia: lorazepam0.5 mg q 4 hour mg daily as tolerated for catatonia with dose spread through the day..  Nurse manages. Restart Loreeve 2 mg daily and use prn lorazepam for catatonia Increase olanzapine 5 mg HS for TRD, anxiety, catatonia, delusions of persecution  12/06/22 appt noted; Psych meds: as above except increase Loreev to 2 mg BID for severe catatonia. nortriptyline 50 mg HS, paroxetine  20 mg daily, lithium 300 mg nightly,  memantine 10 mg twice daily.  Lorazepam 0.5 mg every 4 to 6 hours as needed catatonia or severe anxiety.  Started Loreev 2 mg increased BID for severe catatonia,  increased olanazpine 5 mg HS for 3 days. Received 5th dose of Spravato 84 mg today.    She was able to cooperate with Spravato administration and tolerated it well with typical levels of dissociation which gradually resolved over the 2 hours.  She was able to eat and drink fluids prior to coming to the appointment.   She still feels confused and depressed and anxious. Per family had periods over the weekend where she was confused thinking her H was dead even though he was right in front of her.  Also thinking she is going to die and not get better.  Is eating and drinking fluids well.   Has needed extra lorazepam to sleep.  Limited periods of lucidity per H. Tolerating high dose lorazepam but limited benefit for catatonia. Plan: Continue Lithium 300 mg daily  reduce nortriptyline 25 mg daily in anticipation of change Reduce paroxetine 10 mg daily in anticipation of change Continue vitamin B6 and primidone for tremor For catatonia: lorazepam0.5 mg q 4 hour mg daily as tolerated for catatonia with dose spread through the day..  Nurse manages. DT severity of catatonic hyperactivy Loreeve 2 mg BID and use prn lorazepam for catatonia Continue olanzapine 5 mg HS for TRD, anxiety, catatonia, delusions of persecution  12/08/22 appt noted: Psych meds:  Loreev to 2 mg BID for severe catatonia. nortriptyline 25 mg HS, paroxetine 10 mg daily, lithium 300 mg nightly,  memantine 10 mg twice daily.  Lorazepam 0.5 mg every 4 to 6 hours as needed catatonia or severe anxiety.  Started Loreev 2 mg increased BID for severe catatonia,  increased olanazpine 5 mg HS for 3 days. Received 6th dose of Spravato 84 mg today.    She was able to cooperate with Spravato administration and tolerated it well with typical levels of dissociation which gradually resolved over the 2 hours.  She was able to eat and drink fluids prior to coming to the appointment.   She still feels confused and depressed and anxious. Hopeless.  Thoughts she is going to die. Per nurse and H no sig improvement noted with spravato.   No current alcohol problems  01/14/2023 appointment on an urgent basis: Hospitalized due to psychiatry at [facility] from 12/10/2022 until 01/03/2023.  Hospitalized for bipolar disorder severe depression with catatonia.  Received ECT and Latuda 60 mg daily was started.  Stopped paroxetine, lithium, nortriptyline, and mirtazapine.  Lorazepam was switched to clonazepam. Current psych meds clonazepam 0.5 mg twice daily, Latuda 60 mg daily.  Memantine 10 mg twice daily, weekly ECT rec Wt 101# and better appetite.   Sleep not great but ok.  Will lie awake some.  H says it's pretty good. Alert in the day.  No SE with meds.  Tremor better.   Lorazepam only on ECT days. Plan:  For catatonia: Continue clonazepam 0.5 mg twice daily also for anxiety Continue Latuda 60 mg daily Continue lorazepam 0.5 mg as needed which is currently being used rarely. Recommend continue ECT  weekly as [facility] recommends  02/22/23    [employer] nurse called at 11am.  Jameelah is doing better, receiving ECT twice this week. Not having breakthrough anxiety now and [name] wants to decrease her morning dose of Klonopin to help decrease her sedation. She wants to give her 1/2 of 0.5mg  Klonopin in the am and just give her regular dose the other two times a day. Is this okay.        Electronically signed by Soundra Pilon L at 02/22/2023 11:21 AM  03/11/23 appt:  Current psych med:  clonazepam 0.25 mg AM and 0.5 mg HS on 5/21, Latuda 60,  Worry anxiety.  Not as happy as those around here. Reduced ECT to weekly does not maintain the benefit.  But they didn't want to continue it.   [name] has  talked with ECT doc about holding off on ECT and missed dose but she's gotten more depressed.   Did twice weekly for awhle and it was beneficial.  Sched for Tuesday Plan: Start Trintellix off label for dep.  Trying to find solution that would allow stopping ECT Recommend continue ECT  but increase to twice weekly as [facility] recommends  04/15/23 appt noted:  with nurse and H Reduced clonazepam to 0.25 mg AM and 0.5 mg HS, Latuda 60, incr Trintellix  10 ECT once or twice weekly.  Took 13 day holiday with ECT and progressively downhill.  Worse if goes more than a week without ECT.   Some interest but not much initiative.  Read paper some.  Can initiate conversation with nurse with some recent details.  Still other memory problems frustrating to everyone.   Some memory px even before ECT but worse. Sleep good.  Feels good today and appetite ok.   D been  at the house lately.  D from [place] No excessive sleep.   She naps some easily in the day.  No sig changes with less clonazepam.  Minimal effect from counseling DT memory. No new concerns.   No SE.    05/20/23 appt noted: Tolerated increase in Trintellix to 15 mg daily.  Reduced clonazepam 0.25 mg AM and 0.375 mg HS. No prn lorazepam.  No dep verbalized since here.  Trouble with sleep.  Intial insomnia.  No caffeine.  Some napping.  Not drowsy now.  Will lay on couch after breakfast.   Not anxious about anything.  Sleep trouble in the last few days. Last ECT a little over 2 weeks to wait another week if possible.  H thinks she is more awake with less ECT.  Will spontaneously seek out paper and fix several. Where do we go from here?   Per H .  Wants her to be able to stop ECT. Nurse notes better exec function.  Nurse has seen some lack of motivation and little spontaneous speech.  Won't engage in conversation much per family.   Still some rumination.   She has no other complaints other than some physical concerns.  Plan: For catatonia: clonazepam to 0.25 mg AM and 0.375 mg HS for 2 weeks then 0.25 mg BID for cog reasons and not currently catatonic Continue Latuda 60 mg daily Continue lorazepam 0.5 mg as needed which is currently being used rarely. Incr Trintellix to 20 mg daily off label for bipolar dep.  Trying to find solution that would allow stopping ECT Recommend continue ECT  but increase to twice weekly as [facility] recommends but considering reduction to every 3 weeks is reasonable.  She is not dep now.  06/24/23 appt noted: urgent appt with H and nurse. Tolerated increase in Trintellix to 20 mg daily.  Reduced clonazepam 0.25 mg AM and 0.375 mg HS. No prn lorazepam, mirtazapine 7.5 mg HS, Latuda 60.  Memantine 10 BID Last eCT Monday every 3-3&1/2 weeks.   Mood and anxiety dipped with time away from ECT.   ECT still helpful.  Away from ECT harder to read and watch TV.   [facility] wants 2 week  interval. Minimal initiation of conversation with others.   Not sure what to say in conversation.   General malaise over the last 10 days. Appetite is fair.  D Clydie Braun came last weekend and was helpful.  Not dep today but was before ECT this week.  Meds have not prevented recurrence. H asks about restarting lithium which helped in the past.  07/25/23 appt : seen with  Last ECT 07/19/23.   Lately weekly bc sx got worse.  Nurse has seen improvement.  [facility] saying it is helpful. Added Concerta 18 mg AM.  No SE.  Nurse noticed a little more attempt to conc but not a lot of benefit.   No prn lorazepam needed.   H says she's sleepy a lot of the time.  Nurse suspects clonazepam as the source of sleepiness.  No benefit with more clonazepam. Sleep helped by mirtazapine 7.5 mg HS. No SE with Trintellix 20 mg  Apppetite pretty normal.   3 days without primidone but tremor noticeably worse. Planning to [facility] T[facility] week. H using cane and less mobile. Has assistance at home for parts of the day.  Uses Rolator at night when gets up to urinate.   ECT-MADRS    Flowsheet Row Clinical Support from 12/08/2022 in [facility]  MADRS Total Score 54      PHQ2-9    Flowsheet Row Office Visit from 01/14/2023 in [facility], MD Office Visit from 10/11/2022 in [facility], MD Office Visit from 08/17/2022 in [facility], MD Office Visit from 07/19/2022 in [facility], MD Office Visit from 03/24/2022 in [facility], MD  PHQ-2 Total Score 4 6 0 0 1  PHQ-9 Total Score 14 -- -- -- --      Flowsheet Row ED to Hosp-Admission (Discharged) from 07/01/2022 in [facility] Admission (Discharged) from 05/18/2022 in [place] [facility] Pre-Admission Testing 60 from 05/10/2022 in [facility]  C-SSRS RISK CATEGORY No Risk No Risk No Risk        Past Psychiatric Medication Trials:  Prozac 40  with lithium and Zyprexa 2.5 to 5 mg,  nortriptyline, paroxetine, Effexor 225 Wellbutrin 300 mirtazapine,  Others  Pramipexole 0.625 daily hallucinations  Rexulti EPS, Abilify 15, Vraylar 1.5 1 week,  Latuda 10 mg SE, Olanzapine 10 briefly Caplyta briefly no response   Lamotrigine level 7.5  Namenda,  lorazepam,  Clonazaam 0.5 BID  no response Spravato after 6 tx of 84 mg and 2 of 56 mg .  Hospitalized at [facility] on psychiatric unit from 12/10/2022 to 01/03/2023 and received ECT and started Latuda 60 mg daily.  Stopped paroxetine, nortriptyline, and lithium.  this list is not exhaustive  Review of Systems:  Review of Systems  Constitutional:  Positive for fatigue. Negative for appetite change.  Cardiovascular:  Negative for palpitations.  Gastrointestinal:  Positive for constipation. Negative for nausea.       Linzess and Mozantic managed   Musculoskeletal:  Positive for gait problem.  Neurological:  Positive for dizziness and weakness. Negative for tremors.       Some balance issues  Psychiatric/Behavioral:  Positive for decreased concentration. Negative for agitation, behavioral problems, confusion, dysphoric mood, hallucinations, self-injury, sleep disturbance and suicidal ideas. The patient is nervous/anxious. The patient is not hyperactive.     Medications: I have reviewed the patient's current medications.  Current Outpatient Medications  Medication Sig Dispense Refill   amLODipine (NORVASC) 10 MG tablet Take 1 tablet (10 mg total) by mouth daily. 90 tablet 1   amoxicillin (AMOXIL) 500 MG capsule Take 4 capsules (2,000 mg total) by mouth once for 1 dose one hour before dental appt 12 capsule 2   aspirin 81 MG chewable tablet Chew 1 tablet (81 mg total) by mouth daily.     Calcium Carbonate-Vitamin D (CALCIUM 600/VITAMIN D PO) Take 1 tablet by mouth 2 (two) times daily.     cetirizine (ZYRTEC) 10 MG tablet Take 10 mg by mouth as needed for allergies.      Cholecalciferol (VITAMIN D3) 50 MCG (2000 UT) TABS Take 2,000 Units by mouth in the morning.     clonazePAM (KLONOPIN) 0.5 MG tablet Take 2 tablets (1 mg total) by mouth 2 (two) times daily. (Patient taking  differently: Take 0.5 mg by mouth 2 (two) times daily. Taking 1/2 tablet in the AM and 3/4 tablet at night) 180 tablet 0   cloNIDine (CATAPRES) 0.1 MG tablet Take 1 tablet (0.1 mg total) by mouth as needed prior to ECT treatment 20 tablet 2   COVID-19 mRNA vaccine 2023-2024 (COMIRNATY) syringe Inject into the muscle. 0.3 mL 0   dexlansoprazole (DEXILANT) 60 MG capsule Take 1 capsule by mouth daily 90 capsule 3   glycerin adult 2 g suppository Use 1 suppository as needed if you have not had a bowel movement in  4 to 5 days 25 suppository 0   hydrochlorothiazide (HYDRODIURIL) 12.5 MG tablet Take 1 tablet (12.5 mg total) by mouth daily. 90 tablet 1   influenza vaccine adjuvanted (FLUAD QUADRIVALENT) 0.5 ML injection Inject into the muscle. 0.5 mL 0   Krill Oil 500 MG CAPS Take 500 mg by mouth daily.     lactulose (CHRONULAC) 10 GM/15ML solution Take 30 mLs (20 g total) by mouth 3 (three) times daily between meals as needed for mild constipation. (Patient taking differently: Take 20 g by mouth daily as needed for mild constipation.) 473 mL 3   Lavender Oil 80 MG CAPS Take 160 mg by mouth at bedtime.     levothyroxine (SYNTHROID) 75 MCG tablet Take 1 tablet (75 mcg total) by mouth daily. 90 tablet 2   levothyroxine (SYNTHROID) 75 MCG tablet Take 1 tablet (75 mcg total) by mouth daily. 90 tablet 2   levothyroxine (SYNTHROID) 75 MCG tablet Take 1 tablet (75 mcg total) by mouth daily. 90 tablet 2   linaclotide (LINZESS) 72 MCG capsule Take 1 capsule (72 mcg total) by mouth daily before breakfast. 180 capsule 0   LORazepam (ATIVAN) 0.5 MG tablet TAKE 2 TABLETS (1 MG)  BY MOUTH EVERY NIGHT AT BEDTIME AND 1 TABLET EVERY 4 HOURS AS NEEDED FOR ANXIETY 270 tablet 0   losartan (COZAAR) 100 MG tablet Take 1  tablet (100 mg total) by mouth daily. 90 tablet 1   magic mouthwash w/lidocaine SOLN Take 5 mLs by mouth 3 (three) times daily as needed for mouth pain. 120 mL 0   memantine (NAMENDA) 10 MG tablet Take 1 tablet (10 mg total) by mouth 2 (two) times daily. 180 tablet 1   metoprolol tartrate (LOPRESSOR) 25 MG tablet Take 1 tablet (25 mg total) by mouth as needed. 45 tablet 3   mirtazapine (REMERON) 7.5 MG tablet Take 1 tablet (7.5 mg total) by mouth at bedtime. 90 tablet 0   Multiple Vitamins-Minerals (MULTIVITAMIN WITH MINERALS) tablet Take 1 tablet by mouth daily.     Multiple Vitamins-Minerals (PRESERVISION AREDS 2 PO) Take 1 capsule by mouth in the morning and at bedtime.     naloxegol oxalate (MOVANTIK) 12.5 MG TABS tablet Take 2 tablets (25 mg total) by mouth daily. (Patient taking differently: Take 25 mg by mouth daily. PRN) 60 tablet 3   neomycin-polymyxin b-dexamethasone (MAXITROL) 3.5-10000-0.1 OINT Apply thin line (1 cm) of ointment to right eye twice daily for 1 week. 3.5 g 1   ofloxacin (OCUFLOX) 0.3 % ophthalmic solution Instill 1 drop into right eye four times daily for 7 days. 5 mL 1   omeprazole-sodium bicarbonate (ZEGERID) 40-1100 MG capsule Take 1 capsule by mouth daily before breakfast. 90 capsule 3   ondansetron (ZOFRAN-ODT) 4 MG disintegrating tablet Place 1 tablet every 6 hours by translingual route as needed. (Patient taking differently: Take by mouth as needed.) 20 tablet 0  pneumococcal 20-valent conjugate vaccine (PREVNAR 20) 0.5 ML injection Inject into the muscle. 0.5 mL 0   polyethylene glycol (MIRALAX / GLYCOLAX) 17 g packet Take 17 g by mouth as needed for mild constipation.     RSV vaccine recomb adjuvanted (AREXVY) 120 MCG/0.5ML injection Inject into the muscle. 0.5 mL 0   sennosides-docusate sodium (SENOKOT-S) 8.6-50 MG tablet Take 2 tablets by mouth as needed.     thiamine (VITAMIN B-1) 100 MG tablet Take 1 tablet (100 mg total) by mouth daily. 100 tablet 3    valACYclovir (VALTREX) 1000 MG tablet Take 2 tablets by mouth at onset, then 2 tablets 12 hours later. (Patient taking differently: Take 2,000 mg by mouth as needed. PRn) 30 tablet 1   vitamin B-12 (CYANOCOBALAMIN) 1000 MCG tablet Take 1,000 mcg by mouth at bedtime.     vortioxetine HBr (TRINTELLIX) 20 MG TABS tablet Take 1 tablet (20 mg total) by mouth daily. 90 tablet 0   Lurasidone HCl 60 MG TABS Take 1 tablet (60 mg total) by mouth daily after supper. 90 tablet 0   methylphenidate 27 MG PO CR tablet Take 1 tablet (27 mg total) by mouth daily. 90 tablet 0   primidone (MYSOLINE) 50 MG tablet Take 2 tablets by mouth every morning and 1 tablet by mouth daily at bedtime 90 tablet 5   No current facility-administered medications for this visit.    Medication Side Effects: Other: tremor  stable.  No worse.  Allergies:  Allergies  Allergen Reactions   Propranolol Other (See Comments)    Low blood pressure. Bradycardia    Brexpiprazole Other (See Comments)    Aphasia and catatonia    Past Medical History:  Diagnosis Date   Anxiety    Arthritis    "hips, spine" (03/17/2018)   Bipolar II disorder (HCC)    CHF (congestive heart failure) (HCC)    Chronic bronchitis (HCC)    Chronic lower back pain    Chronic right hip pain    CKD (chronic kidney disease), stage II    Coronary artery disease    stent x1   Esophagitis, erosive    GAD (generalized anxiety disorder)    GERD (gastroesophageal reflux disease)    Headache    "maybe monthly" (03/17/2018))   Heart murmur, systolic    History of adenomatous polyp of colon    08-04-2016  tubular adenoma   History of blood transfusion 12/2017   "related to vascular hematoma"   History of electroconvulsive therapy    at [facility]--  started 04-15-2015 to 11-17-2016  total greater than 40 times   History of hiatal hernia    Hyperlipidemia    Hypertension    Hypothyroidism    Internal carotid artery stenosis, bilateral    per last duplex  05-01-2014  bilateral ICA 40-59%   Major depression, chronic    ECT treatments extensive and multiple started 07/ 2016   Memory loss    "both short and long-term; needs frequent reminders to follow instrucitons" (05/16/2017)   Migraines    "none in years" (03/17/2018)   OSA (obstructive sleep apnea)    per study 06/ 2012 moderate OSA  ; "refuses to wear masks" (03/17/2018)   Osteoporosis    Pneumonia 07/29/2022   Poor historian    due to short term memory loss   Presence of permanent cardiac pacemaker 03/17/2018   Pulmonary nodule    monitored by pcp   S/P placement of cardiac pacemaker 03/17/18 [facility]  03/18/2018   Short-term memory loss    Sick sinus syndrome (HCC)     Family History  Problem Relation Age of Onset   Heart attack Father 25       deceased   Hypertension Father    Heart disease Father    Dementia Brother    Parkinson's disease Brother    Heart disease Brother    Breast cancer Paternal Aunt        Age 73's   Breast cancer Paternal Grandmother        Age unknown   Colon cancer Neg Hx     Social History   Socioeconomic History   Marital status: Married    Spouse name: Dr. Victorino Dike   Number of children: 2   Years of education: Not on file   Highest education level: Not on file  Occupational History   Occupation: housewife    Employer: UNEMPLOYED  Tobacco Use   Smoking status: Former    Current packs/day: 0.00    Average packs/day: 2.0 packs/day for 15.0 years (30.0 ttl pk-yrs)    Types: Cigarettes    Start date: 01/13/1956    Quit date: 01/13/1971    Years since quitting: 52.5    Passive exposure: Never   Smokeless tobacco: Never  Vaping Use   Vaping status: Never Used  Substance and Sexual Activity   Alcohol use: Yes    Comment: occassional 1 x a week   Drug use: Never   Sexual activity: Not Currently    Comment: intercourse age 66, sexual partners less than 5  Other Topics Concern   Not on file  Social History Narrative   Right handed    Social Determinants of Health   Financial Resource Strain: Low Risk  (12/12/2022)   Received from [facility], [facility]   Overall Financial Resource Strain (CARDIA)    Difficulty of Paying Living Expenses: Not hard at all  Food Insecurity: No Food Insecurity (12/12/2022)   Received from [facility], [facility]   Hunger Vital Sign    Worried About Running Out of Food in the Last Year: Never true    Ran Out of Food in the Last Year: Never true  Transportation Needs: No Transportation Needs (12/12/2022)   Received from [facility], [facility]   [facility] - Transportation    In the past 12 months, has lack of transportation kept you from medical appointments or from getting medications?: No    Lack of Transportation (Non-Medical): No  Physical Activity: Not on file  Stress: Not on file  Social Connections: Not on file  Intimate Partner Violence: Not At Risk (07/06/2022)   Humiliation, Afraid, Rape, and Kick questionnaire    Fear of Current or Ex-Partner: No    Emotionally Abused: No    Physically Abused: No    Sexually Abused: No    Past Medical History, Surgical history, Social history, and Family history were reviewed and updated as appropriate.   Please see review of systems for further details on the patient's review from today.   Objective:   Physical Exam:  LMP  (LMP Unknown)   Physical Exam Constitutional:      General: She is not in acute distress.    Appearance: She is well-developed.  Musculoskeletal:        General: No deformity.  Neurological:     Mental Status: She is alert and oriented to person,  place, and time.     Motor: No tremor.     Coordination: Coordination abnormal.     Comments: Cane use  Psychiatric:        Attention and Perception: She is attentive.        Mood and Affect: Mood is anxious. Mood is not depressed. Affect is not labile,  blunt or inappropriate.        Speech: Speech is not slurred.        Behavior: Behavior normal. Behavior is not slowed, withdrawn or combative.        Thought Content: Thought content is not delusional. Thought content does not include homicidal or suicidal ideation. Thought content does not include suicidal plan.        Cognition and Memory: Cognition is impaired. She exhibits impaired recent memory.        Judgment: Judgment normal.     Comments: Insight fair to good. No auditory or visual hallucinations.  Depression resolved.  and anxiety are remarkably improved Delusional guilt not elicited Affect pleasant and smiling . Minimal spontaneous speech     Lab Review:     Component Value Date/Time   NA 139 06/27/2023 1242   NA 137 12/29/2022 0000   K 3.8 06/27/2023 1242   CL 102 06/27/2023 1242   CO2 22 06/27/2023 1242   GLUCOSE 63 (L) 06/27/2023 1242   BUN 20 06/27/2023 1242   BUN 12 12/29/2022 0000   CREATININE 0.77 06/27/2023 1242   CALCIUM 9.5 06/27/2023 1242   PROT 6.8 06/27/2023 1242   PROT 6.2 04/20/2017 1151   ALBUMIN 3.9 09/02/2022 1221   ALBUMIN 3.9 04/20/2017 1151   AST 18 06/27/2023 1242   ALT 17 06/27/2023 1242   ALKPHOS 64 09/02/2022 1221   BILITOT 0.4 06/27/2023 1242   BILITOT <0.2 04/20/2017 1151   GFRNONAA >60 07/08/2022 0429   GFRNONAA 50 (L) 07/10/2020 1021   GFRAA 58 (L) 07/10/2020 1021       Component Value Date/Time   WBC 7.0 06/27/2023 1242   RBC 4.13 06/27/2023 1242   HGB 12.0 06/27/2023 1242   HGB 10.2 (L) 04/20/2017 1151   HCT 37.7 06/27/2023 1242   HCT 29.6 (L) 04/20/2017 1151   PLT 241 06/27/2023 1242   PLT 385 (H) 04/20/2017 1151   MCV 91.3 06/27/2023 1242   MCV 89 04/20/2017 1151   MCH 29.1 06/27/2023 1242   MCHC 31.8 (L) 06/27/2023 1242   RDW 14.5 06/27/2023 1242   RDW 13.9 04/20/2017 1151   LYMPHSABS 2,310 06/27/2023 1242   LYMPHSABS 1.5 04/20/2017 1151   MONOABS 0.7 07/07/2022 0458   EOSABS 91 06/27/2023 1242   EOSABS 0.3  04/20/2017 1151   BASOSABS 42 06/27/2023 1242   BASOSABS 0.0 04/20/2017 1151    Lithium Lvl  Date Value Ref Range Status  10/21/2022 0.5 (L) 0.6 - 1.2 mmol/L Final  10/21/2022 lithium level 0.5 on 300 mg nightly is stable.,  Nortriptyline 115 on 75 mg HS.  08/02/22 nortriptyline 85, lihtium 0.5  Nortriptyline level 51 on 50 mg nightly with paroxetine 20 mg daily  07/10/2020 lithium level 0.6 at [facility] office and nortriptyline level 130 on the current dosages of lithium 300 mg nightly and nortriptyline 75 mg nightly  02/20/2021 labs lithium 0.6 on 300 mg nightly.  Nortriptyline level 49 which is lower than expected on 75 mg nightly along with paroxetine 20 mg daily.    No results found for: "PHENYTOIN", "PHENOBARB", "VALPROATE", "CBMZ"   .res  Assessment: Plan:    Severe bipolar I disorder, current or most recent episode depressed, with catatonia (HCC) - Plan: Lurasidone HCl 60 MG TABS, methylphenidate 27 MG PO CR tablet  Generalized anxiety disorder  Catatonia  Insomnia due to mental condition  Mild cognitive impairment  Bipolar affective disorder, currently depressed, moderate (HCC) - Plan: primidone (MYSOLINE) 50 MG tablet  Memory loss - Plan: methylphenidate 27 MG PO CR tablet    History of lithium toxicity repeatedly while on chlorthalidone. No tremor px off of the lithium.    50-minute visit today with H and nurse present..  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  Recent decompensation and rapidly getting worse with some cognive impairment and mild psychotic sx and altered mental status. Her case has been highly complex and treatment resistant at times.  No current catatonia but is somewhat anxious easily but only with changes.  No lorazepam needed.Marland Kitchen  And worse with reduction of clonazepam without improvement in cog with reduction. So  increased it back to 0.5 mg BID but balance issues and sleepiness so reducing back to 0.25 mg BID.    She  had remarkedly improved with the ECT + change in psychiatric medicine to Latuda 60 mg daily with clonazepam 0.5 mg twice daily. But they would like to get her meds to the point she could stop ECT bc cog problems which are ongoing.  She is not ruminative.  Affect is bright.  Dep resolved including anhedonia largely with ECT.  Catatonia is resolved .  Cog is better with skipping a couple of weeks of ECT but is not normal with STM px and not very talkative socially  she can recall events of the last few days..   She is still anxious and somewhat insecure.  Short-term memory impairment is consistent with what would be expected with the ECT at this time. E[facility] is weekly [facility].  options of trials of Trintellix, Auvelity, etc. Reviewed the old chart and consider very low-dose Vraylar    From records it appears her last episode of ECT was in September 2020.   Previously Disc Dr. Blossom Hoops concerns about longterm ECT on cognition.  Disc therapy for her  through Dr. Terrall Laity  3-4 times.  Didn't seem to help.  Seeing another person at [facility].  Trouble with memory interferes with some of the benefit.  For catatonia and anxiety increase clonazepam to 0.25 mg AM and 0.375 mg PM  Continue Latuda 60 mg daily Continue lorazepam 0.5 mg as needed which is currently being used rarely. continue Trintellix to 20 mg daily off label for bipolar dep.  Trying to find solution that would allow stopping ECT  We discussed the short-term risks associated with benzodiazepines including sedation and increased fall risk among others.  Discussed long-term side effect risk including dependence, potential withdrawal symptoms, and the potential eventual dose-related risk of dementia.  But recent studies from 2020 dispute this association between benzodiazepines and dementia risk. Newer studies in 2020 do not support an association with dementia.  increase off label Concerta lowest dose 27 mg AM for TRD and cog problems [place]  effects from the 18 mg. Discussed potential benefits, risks, and side effects of stimulants with patient to include increased heart rate, palpitations, insomnia, increased anxiety, increased irritability, or decreased appetite or mania, agitation.   Instructed patient to contact office if experiencing any significant tolerability issues.  Discussed potential metabolic side effects associated with atypical antipsychotics, as well as potential risk for movement  side effects. Advised pt to contact office if movement side effects occur.   Recommend continue ECT  but increase to weekly as [facility] recommends.  She is not dep now but it tends to recur between ECT.  Disc option of return to lithium bc it helped in the past  but does have more potential SE bc had tremor with it before.  FU 4 weeks  Meredith Staggers MD, DFAPA Please see After Visit Summary for patient specific instructions.  Future Appointments  Date Time Provider Department Center  08/26/2023 11:00 AM Cottle, Steva Ready., MD [facility] None  10/03/2023  7:00 AM [facility] DEVICE REMOTES [facility]  01/02/2024  7:00 AM [facility] DEVICE REMOTES [facility]  04/02/2024  7:00 AM [facility] DEVICE REMOTES [facility]  07/02/2024  7:00 AM [facility] DEVICE REMOTES [facility]  10/01/2024  7:00 AM [facility] DEVICE REMOTES [facility]  12/31/2024  7:00 AM [facility] DEVICE REMOTES [facility]  04/01/2025  7:00 AM [facility] DEVICE REMOTES [facility]  07/01/2025  7:00 AM [facility] DEVICE REMOTES [facility]  09/30/2025  7:00 AM [facility] DEVICE REMOTES [facility]      No orders of the defined types were placed in this encounter.      -------------------------------

## 2023-07-26 DIAGNOSIS — F419 Anxiety disorder, unspecified: Secondary | ICD-10-CM | POA: Diagnosis not present

## 2023-07-26 DIAGNOSIS — F332 Major depressive disorder, recurrent severe without psychotic features: Secondary | ICD-10-CM | POA: Diagnosis not present

## 2023-07-26 DIAGNOSIS — J449 Chronic obstructive pulmonary disease, unspecified: Secondary | ICD-10-CM | POA: Diagnosis not present

## 2023-07-26 DIAGNOSIS — I5032 Chronic diastolic (congestive) heart failure: Secondary | ICD-10-CM | POA: Diagnosis not present

## 2023-07-26 DIAGNOSIS — G4730 Sleep apnea, unspecified: Secondary | ICD-10-CM | POA: Diagnosis not present

## 2023-07-26 DIAGNOSIS — N189 Chronic kidney disease, unspecified: Secondary | ICD-10-CM | POA: Diagnosis not present

## 2023-07-26 DIAGNOSIS — E039 Hypothyroidism, unspecified: Secondary | ICD-10-CM | POA: Diagnosis not present

## 2023-07-26 DIAGNOSIS — I2510 Atherosclerotic heart disease of native coronary artery without angina pectoris: Secondary | ICD-10-CM | POA: Diagnosis not present

## 2023-07-28 ENCOUNTER — Other Ambulatory Visit (HOSPITAL_BASED_OUTPATIENT_CLINIC_OR_DEPARTMENT_OTHER): Payer: Self-pay

## 2023-08-01 ENCOUNTER — Other Ambulatory Visit (HOSPITAL_BASED_OUTPATIENT_CLINIC_OR_DEPARTMENT_OTHER): Payer: Self-pay

## 2023-08-01 ENCOUNTER — Encounter (HOSPITAL_BASED_OUTPATIENT_CLINIC_OR_DEPARTMENT_OTHER): Payer: Self-pay

## 2023-08-01 ENCOUNTER — Encounter: Payer: Self-pay | Admitting: Psychiatry

## 2023-08-01 ENCOUNTER — Other Ambulatory Visit: Payer: Self-pay | Admitting: Psychiatry

## 2023-08-01 DIAGNOSIS — F3132 Bipolar disorder, current episode depressed, moderate: Secondary | ICD-10-CM

## 2023-08-01 DIAGNOSIS — F061 Catatonic disorder due to known physiological condition: Secondary | ICD-10-CM

## 2023-08-01 DIAGNOSIS — F411 Generalized anxiety disorder: Secondary | ICD-10-CM

## 2023-08-01 MED ORDER — PRIMIDONE 50 MG PO TABS
50.0000 mg | ORAL_TABLET | Freq: Two times a day (BID) | ORAL | 0 refills | Status: DC
Start: 2023-08-01 — End: 2023-08-02
  Filled 2023-08-01: qty 180, 60d supply, fill #0

## 2023-08-01 MED ORDER — CLONAZEPAM 0.5 MG PO TABS
0.5000 mg | ORAL_TABLET | Freq: Two times a day (BID) | ORAL | 0 refills | Status: DC
  Filled 2023-08-01 – 2023-11-16 (×2): qty 180, 90d supply, fill #0

## 2023-08-01 NOTE — Telephone Encounter (Signed)
Pt's Klonopin is 90's days; Mysoline has has 30 days supply not 45. The rx has 5 rf's attached would you like me to change it to 270 disp and take away the rf's?

## 2023-08-02 ENCOUNTER — Other Ambulatory Visit (HOSPITAL_BASED_OUTPATIENT_CLINIC_OR_DEPARTMENT_OTHER): Payer: Self-pay

## 2023-08-02 ENCOUNTER — Other Ambulatory Visit: Payer: Self-pay | Admitting: Psychiatry

## 2023-08-02 DIAGNOSIS — F3132 Bipolar disorder, current episode depressed, moderate: Secondary | ICD-10-CM

## 2023-08-02 DIAGNOSIS — J449 Chronic obstructive pulmonary disease, unspecified: Secondary | ICD-10-CM | POA: Diagnosis not present

## 2023-08-02 DIAGNOSIS — F419 Anxiety disorder, unspecified: Secondary | ICD-10-CM | POA: Diagnosis not present

## 2023-08-02 DIAGNOSIS — N189 Chronic kidney disease, unspecified: Secondary | ICD-10-CM | POA: Diagnosis not present

## 2023-08-02 DIAGNOSIS — K219 Gastro-esophageal reflux disease without esophagitis: Secondary | ICD-10-CM | POA: Diagnosis not present

## 2023-08-02 DIAGNOSIS — I2510 Atherosclerotic heart disease of native coronary artery without angina pectoris: Secondary | ICD-10-CM | POA: Diagnosis not present

## 2023-08-02 DIAGNOSIS — E039 Hypothyroidism, unspecified: Secondary | ICD-10-CM | POA: Diagnosis not present

## 2023-08-02 DIAGNOSIS — I5032 Chronic diastolic (congestive) heart failure: Secondary | ICD-10-CM | POA: Diagnosis not present

## 2023-08-02 DIAGNOSIS — G4730 Sleep apnea, unspecified: Secondary | ICD-10-CM | POA: Diagnosis not present

## 2023-08-02 DIAGNOSIS — F332 Major depressive disorder, recurrent severe without psychotic features: Secondary | ICD-10-CM | POA: Diagnosis not present

## 2023-08-02 MED ORDER — PRIMIDONE 50 MG PO TABS
ORAL_TABLET | ORAL | 0 refills | Status: DC
  Filled 2023-08-02: qty 270, 90d supply, fill #0

## 2023-08-02 NOTE — Telephone Encounter (Signed)
She's requesting 90 day supply. She has an appt 11/22

## 2023-08-03 ENCOUNTER — Other Ambulatory Visit (HOSPITAL_BASED_OUTPATIENT_CLINIC_OR_DEPARTMENT_OTHER): Payer: Self-pay

## 2023-08-03 ENCOUNTER — Other Ambulatory Visit (HOSPITAL_COMMUNITY): Payer: Self-pay

## 2023-08-04 ENCOUNTER — Other Ambulatory Visit (HOSPITAL_BASED_OUTPATIENT_CLINIC_OR_DEPARTMENT_OTHER): Payer: Self-pay

## 2023-08-04 ENCOUNTER — Other Ambulatory Visit: Payer: Self-pay

## 2023-08-05 ENCOUNTER — Other Ambulatory Visit (HOSPITAL_BASED_OUTPATIENT_CLINIC_OR_DEPARTMENT_OTHER): Payer: Self-pay

## 2023-08-08 ENCOUNTER — Ambulatory Visit: Payer: Medicare Other | Admitting: Psychiatry

## 2023-08-09 ENCOUNTER — Ambulatory Visit: Payer: Medicare Other | Admitting: Psychiatry

## 2023-08-09 DIAGNOSIS — J449 Chronic obstructive pulmonary disease, unspecified: Secondary | ICD-10-CM | POA: Diagnosis not present

## 2023-08-09 DIAGNOSIS — K219 Gastro-esophageal reflux disease without esophagitis: Secondary | ICD-10-CM | POA: Diagnosis not present

## 2023-08-09 DIAGNOSIS — E039 Hypothyroidism, unspecified: Secondary | ICD-10-CM | POA: Diagnosis not present

## 2023-08-09 DIAGNOSIS — F332 Major depressive disorder, recurrent severe without psychotic features: Secondary | ICD-10-CM | POA: Diagnosis not present

## 2023-08-09 DIAGNOSIS — I2510 Atherosclerotic heart disease of native coronary artery without angina pectoris: Secondary | ICD-10-CM | POA: Diagnosis not present

## 2023-08-09 DIAGNOSIS — F419 Anxiety disorder, unspecified: Secondary | ICD-10-CM | POA: Diagnosis not present

## 2023-08-09 DIAGNOSIS — N189 Chronic kidney disease, unspecified: Secondary | ICD-10-CM | POA: Diagnosis not present

## 2023-08-09 DIAGNOSIS — I5032 Chronic diastolic (congestive) heart failure: Secondary | ICD-10-CM | POA: Diagnosis not present

## 2023-08-09 DIAGNOSIS — G4730 Sleep apnea, unspecified: Secondary | ICD-10-CM | POA: Diagnosis not present

## 2023-08-12 ENCOUNTER — Encounter: Payer: Self-pay | Admitting: Physician Assistant

## 2023-08-16 ENCOUNTER — Other Ambulatory Visit: Payer: Self-pay

## 2023-08-16 ENCOUNTER — Other Ambulatory Visit (HOSPITAL_BASED_OUTPATIENT_CLINIC_OR_DEPARTMENT_OTHER): Payer: Self-pay

## 2023-08-16 DIAGNOSIS — G4730 Sleep apnea, unspecified: Secondary | ICD-10-CM | POA: Diagnosis not present

## 2023-08-16 DIAGNOSIS — F332 Major depressive disorder, recurrent severe without psychotic features: Secondary | ICD-10-CM | POA: Diagnosis not present

## 2023-08-16 DIAGNOSIS — N189 Chronic kidney disease, unspecified: Secondary | ICD-10-CM | POA: Diagnosis not present

## 2023-08-16 DIAGNOSIS — I509 Heart failure, unspecified: Secondary | ICD-10-CM | POA: Diagnosis not present

## 2023-08-16 DIAGNOSIS — J449 Chronic obstructive pulmonary disease, unspecified: Secondary | ICD-10-CM | POA: Diagnosis not present

## 2023-08-16 DIAGNOSIS — I2510 Atherosclerotic heart disease of native coronary artery without angina pectoris: Secondary | ICD-10-CM | POA: Diagnosis not present

## 2023-08-16 DIAGNOSIS — E039 Hypothyroidism, unspecified: Secondary | ICD-10-CM | POA: Diagnosis not present

## 2023-08-16 DIAGNOSIS — K219 Gastro-esophageal reflux disease without esophagitis: Secondary | ICD-10-CM | POA: Diagnosis not present

## 2023-08-16 DIAGNOSIS — F419 Anxiety disorder, unspecified: Secondary | ICD-10-CM | POA: Diagnosis not present

## 2023-08-24 DIAGNOSIS — I2510 Atherosclerotic heart disease of native coronary artery without angina pectoris: Secondary | ICD-10-CM | POA: Diagnosis not present

## 2023-08-24 DIAGNOSIS — E039 Hypothyroidism, unspecified: Secondary | ICD-10-CM | POA: Diagnosis not present

## 2023-08-24 DIAGNOSIS — I5032 Chronic diastolic (congestive) heart failure: Secondary | ICD-10-CM | POA: Diagnosis not present

## 2023-08-24 DIAGNOSIS — F332 Major depressive disorder, recurrent severe without psychotic features: Secondary | ICD-10-CM | POA: Diagnosis not present

## 2023-08-24 DIAGNOSIS — J449 Chronic obstructive pulmonary disease, unspecified: Secondary | ICD-10-CM | POA: Diagnosis not present

## 2023-08-24 DIAGNOSIS — G4733 Obstructive sleep apnea (adult) (pediatric): Secondary | ICD-10-CM | POA: Diagnosis not present

## 2023-08-26 ENCOUNTER — Encounter: Payer: Self-pay | Admitting: Psychiatry

## 2023-08-26 ENCOUNTER — Other Ambulatory Visit (HOSPITAL_BASED_OUTPATIENT_CLINIC_OR_DEPARTMENT_OTHER): Payer: Self-pay

## 2023-08-26 ENCOUNTER — Ambulatory Visit: Payer: Medicare Other | Admitting: Psychiatry

## 2023-08-26 DIAGNOSIS — G250 Essential tremor: Secondary | ICD-10-CM | POA: Diagnosis not present

## 2023-08-26 DIAGNOSIS — R413 Other amnesia: Secondary | ICD-10-CM | POA: Diagnosis not present

## 2023-08-26 DIAGNOSIS — F5105 Insomnia due to other mental disorder: Secondary | ICD-10-CM | POA: Diagnosis not present

## 2023-08-26 DIAGNOSIS — F411 Generalized anxiety disorder: Secondary | ICD-10-CM | POA: Diagnosis not present

## 2023-08-26 DIAGNOSIS — F314 Bipolar disorder, current episode depressed, severe, without psychotic features: Secondary | ICD-10-CM

## 2023-08-26 DIAGNOSIS — F061 Catatonic disorder due to known physiological condition: Secondary | ICD-10-CM

## 2023-08-26 MED ORDER — METHYLPHENIDATE HCL ER (OSM) 36 MG PO TBCR
36.0000 mg | EXTENDED_RELEASE_TABLET | Freq: Every day | ORAL | 0 refills | Status: DC
  Filled 2023-08-26 – 2023-09-07 (×2): qty 90, 90d supply, fill #0

## 2023-08-26 NOTE — Progress Notes (Signed)
Meghan Oliver [id number] 12-30-1940 82 y.o.  Subjective:   Patient ID:  Meghan Oliver is a 82 y.o. (DOB 08-07-1941) female. Patient was last seen August 21, 2018 Chief Complaint:  Chief Complaint  Patient presents with   Follow-up   Depression   Memory Loss     Meghan Oliver presents to the office today for follow-up of Severe TR bipolar depression with psychotic features including catatonia.  11/30/2018 was the last visit and the following was noted: Had ECT yesterday at [facility].  Last was Jan 8 and was doing well then.  Next scheduled April 15.  Had a lithium level from there which is pending. Pretty good until the last 10 days to 2 weeks with less energy and motivation.  Bothered by old sick dog.  Worried about how that will affect her.  Has slept with the dog.  Enjoyed [place].  Going to gym and playing [place].  Took the class and didn't feel she caught on quickly. Wonders if it is bc of the ECT memory effects.  Bridge is harder and not playing.  Planning to return to [facility] mid April.   spent the winter in [place] per usual..  No significant depression since here. ECT frequency about 6 weeks.  Gets very anxious the day of the ECT.  ECT consistently for a year.  Best in mood in 7-8 years.  Also the pacemaker helped.  Friends notice the benefit.  Plan:  Option increase in the nortriptyline if needed.  Considered this.  They agree and would like to increase nortriptyline to 75 to try to reduce tendency to depression before the ECT.   03/19/19  Addendum: Here the contents from an email from the nurse for Meghan Oliver  Hello [name],   I hope you and your family are healthy and well. I took Meghan Oliver to [facility] for ECT and here are labs from 01/26/19. I am going to also forward them to [facility] for follow up with her LFT's. They are still higher than I would like.  Her lithium level was 0.51 on a daily dose of 300mg  qohs x 4 nights- alternating with 450mg  qohs x 3 nights. She is not demonstrating  any signs of elevated levels, minimal hand tremors, less anxiety and restlessness since her ECT treatment.  Her Nortryptyline level was 140 on 75mg  qhs.  I would like to continue her current dosages of both the above meds and recheck her lithium level at her next scheduled ECT of 03/23/19.  Please let me know of any changes you would like to make. Take care, Meghan Loft RN Lithium level was 0.4 on the 450mg  Oliver with 300 mg QOD. No changes were made in meds.  11/15/2019 phone call: Telephone call from patient's husband Meghan Oliver on 11/14/2019  Patient is scheduled for ECT soon for maintenance ECT.  He thinks it is been 2-1/2 months since the last ECT but he is going to check the date for sure.  It is still be done being done at [facility].  He is wondering about skipping this treatment because the patient has continued to be free of depression and seems cognitively clearer as these ECT treatments have been spread out further from each other.  Patient remains on medications as prescribed.  If it is truly been over 2 months since the last ECT then it is reasonable to consider discontinuing ECT.  It is unlikely that ECT with the frequency of greater than 2 months  is significantly helpful at preventing her relapse.  If however the ECT frequency is less than every 2 months it could still be helping to prevent recurrence.  He indicated he would consider this information and discuss it with her ECT team and make a decision.  They are heading to [place] in the next few weeks.  Needs to schedule an appointment with me for follow-up because it is been many months.  He agrees.   02/19/2020 appt, the following noted: Moving to [facility] in a few months.  Ready to downsize.   Stopped ECT as discussed and has not been more deprressed.   Walks dog 4 times daily.   Lithium 0.7 on  01/30/20 on lithium 300 mg plus 150 mg on M, W, F Nortriptyline 70 on 75 mg HS. No SE except tremor.  Balance is not great.  Very happy.    No depression since here.  No mood swings.  Sleep and appetite is OK.  No med changes:  04/03/2020 TC with the following noted: Patient's husband called stating that her tremor was a little worse and she was having some stutter. No evidence of stroke otherwise. First thing would be to check serum lithium level and BMP.  Order written.  Have asked her husband to let us know about the lab to which it should be sent.  04/22/20 TC witht he following noted: Lithium level is not dangerously high but it is has crept up to 1.0 which is higher than desired for her.  Our goal for her is 0.5-0.7.  At the current level it is likely causing side effects so we should reduce the dosage from lithium 300 mg plus 150 mg on M, W, F  TO 300 mg daily.  Meaning drop off the 150 mg capsule.  It will take about 2 weeks for the level to gradually come down to the desired level.  05/08/2020 appt with the following noted: Seen with Meghan Oliver and Meghan Oliver nurse. Moving to [facility] and feels OK about it.  I think I'll be fine.   Pretty good with balance.  Doing yoga and it helps. Meghan Oliver has cancer. Old dog 17 & 1/2 yo still living. Meghan Oliver's concerns when went to [place] for D's BD she had difficulty time with tremor lethargy, more confusion.  Better when go back home. Meghan Oliver notes using more lorazepam. At times gait is unsteady. Not significantly depressed.  Can be anxious at times.  Eating okay.  Sleeping okay.  Is easily confused under stress. Plan: reduce lithium to 300 mg nightly.  07/14/20 appt with the following noted: Seen with her husband as well as her family nurse. Feeling fine and pleased with that.   Some anxiety over the move to smaller place. Meghan Oliver's twin also having heart problems Meghan Oliver. Meghan Oliver's kids came and visited.    Plan: Continue nortriptyline 75 mg, paroxetine 20 mg, lithium 300 mg, Namenda 10 mg twice daily, lorazepam 0.5 mg 1/2-1 3 times daily as needed, mirtazapine 7.5 mg nightly  02/27/2021 appointment with  the following noted:  Seen with Meghan Oliver and nurse Meghan Oliver Received email from nurse Meghan Oliver on 02/24/2021 indicated that he had moved into [facility] living in May.  Haylee has gradually become more depressed and more anxious using lorazepam as prescribed 3 times daily.  More memory issues.  She has remained engaged and initiating appropriate conversation.  She is very worried about her 48 year old dog who is in poor health and fears she will become more  depressed when the dog dies.  She and her husband do not wish to prefer to pursue further ECT unless absolutely necessary. Likes [facility] so far.   No depression by her report.  Sold big house in Feb.  It worked out well.   Worries about her dog 79 yo will die soon. Meghan Oliver agrees she's done well with depression.   Enjoys things and has interests. Plan: Continue Lithium 300 mg daily  Continue nortriptyline 75 mg daily Continue paroxetine 20 mg daily Continue lorazepam 0.5 mg 3 times daily for both anxiety and catatonia Continue vitamin B6 and primidone for tremor Cerefolin NAC 2 daily.  05/13/2021 appointment with the following noted: Happy with [facility].  Meghan Oliver and she agree is doing well with depression.[place] socialization. She complains of memory.   Family visited and getting along with Dondra Spry better.  Meghan Oliver says Dondra Spry is acting better.  Goes to dinner and activities together now.  Better relationship with Meghan Oliver.. Tremor has been OK.   Ativan usually 0.25 mg TID and tolerated. Anxiety managed with this generally. Molly dog health more stable. To [place] before T'giving until April.   Plan: Continue Lithium 300 mg daily  Continue nortriptyline 75 mg daily Continue paroxetine 20 mg daily Continue lorazepam 0.5 mg 3 times daily for both anxiety and catatonia Continue vitamin B6 and primidone for tremor   08/14/2021 appointment with the following noted: Seen with her husband Meghan Oliver and nurse Meghan Oliver. Everyone reports that her mood has been stable.  Her anxiety  is manageable with as needed lorazepam.  She is still happy with the transition to [facility].  They are preparing to go to [place] for the winter per usual but may sell their house father there. Tolerating meds without any unusual side effects. Is dealing with grief over the loss of her dog Molly.  03/10/2022 appt noted:  seen with Meghan Oliver and nurse [facility] house. Meghan Oliver not good phsyically hard to play golf.  Been really hard. He'll be 85 in July.  Some stress scheduling with D's.   60 th wedding necessary this month.   Mood up and down.  Sister in law across the street can be difficult and demeaning.  She feels unsettled.  Some worry. Some back pain limiting activity.   Sleep is ok except with pain awakening.   No SE, except tremor some worse about 3-5 and helped by lorazepam 0.25 mg then No greater than 1 mg lorazepam daily Patient denies any recent difficulty with anxiety except as noted.  Patient denies difficulty with sleep initiation or maintenance. Denies appetite disturbance.    Patient has some difficulty with concentration.  Patient denies any suicidal ideation.  Usually takes Ativan at night and sleeps well 8 hours.   Plan no med changes  10/23/213 appt noted: Still doing well. Pneumonia and hosp for 6 days 3 weeks ago.  Viral.  Improving at this time. Increased tremor gradually in 3-4 mos helped by lorazepam avg 1.00-1.25 mg daily. No change in primidone. Total R hip August 2023 and done well.  Finishing home PT.   Appetite poor for 7-8 mos.  2 protein drinks daily but may not eat more than 1 good meal a day. [facility]. Substantial memory problems in hospital and in unusual enviornments. Nurse notices decline.  B with LBD passed in August. No pain meds or muscle relaxants beyond pain. No depression.  Meghan Oliver note sometimes she has less motivation than others. Meghan Oliver notes some normal waxing and waning of  mood. Good social interaction.   Not spending winter in [facility] this year.  D still  lives there. Plan: Increase mirtazapine 15 mg HS for appetite.  Also used for sleep and depression.  09/23/22 appt noted:  with nurse and Meghan Oliver Meghan Oliver feeling bad with more pain and fatigue.  Meghan Oliver has CHF.   Made Alexarae sad.  Lost a cgood friends.  Rough time.  Mostly worry about Meghan Oliver.  She feels afraid to be alone if something happens.   Not sig depressed.    Just worry . Meghan Oliver can't be as active.  Taking a shower is hard.   She's back exercising again.   Went to [employer] for Thanksgiving in [facility] and plan to go back for Christmas. It's hard to travel. Told nurse she was depressed. Appetite is better and once asleep is OK. Using more lorazepam for anxiety 3-4 daily. Plan: Continue Lithium 300 mg daily  Continue nortriptyline 75 mg daily Continue paroxetine 20 mg daily Continue lorazepam 0.5 mg 3 times daily for both anxiety and catatonia Continue vitamin B6 and primidone for tremor Increased and it helped mirtazapine 15 mg HS for appetite.  Also used for sleep and depression.  10/19/22  RTC  Meghan Oliver reports not doing well.  Went to [facility].  Recent pneumonia.  They are thinking this is aspiration pneurmonia. Lethargic and anxious with tremor. Ruminating.  Hot to cold .  Low tolerance.  Going downhill quickly.  She's verbalizing depression and desire to isolate.  Won't go out.  Started within the last 2 weeks. Usually doesn't complain when she's sick.   Meghan Oliver's biggest concern is that Doreatha Martin is having trouble handling this bc she's up at night rambling and not really safe to be up at night.    Will see her 10/22/22 Get lithium and nortriptyline levels.   Meredith Staggers, MD, [facility]     10/22/22 emergency appt with nurse Meghan Oliver and Meghan Oliver Meghan Oliver: Pneumonia Oct and then again 2nd week January.  It appears to have largely resoved.  It's really bad.  Nausea. Anxious bc Meghan Oliver ill.  Doesn't want him to leave.  Meghan Oliver sees anxiety triggering anxiety with little to no appetite.  Hypersensitive to stimuli like light and noises.  Doesn't want  TV on.  Meghan Oliver says totally reversed in last 6 weeks.    Meghan Oliver reports she's overemotional.  Friends died. Losing wt 91.4# today. More trouble with sleep. Lorazepam increased to 2 mg daily.  Not sleepy with it.  Doesn't seem to be helpful. Last week neglect hygiene but better this week a little. Meghan Oliver says STM is poor at times and at times disoriented if in [place].  Will walk in bathroom and wonder where sh is. Barium swallow pending for evaluation of refulex.    10/26/22 RTC:  RTC   No improvement since Friday.  Gradually less engaged.  Can be overstimulated with visitors.   Disc nortriptyline level 115 is a little higher than it was and higher levels can cause SE in elderly.   Reduce nortriptyline to 50 mg HS. Taking lorazepam   Consider low dose of Vraylar or pramipexole.  Or Spravato.    Meredith Staggers, MD, [facility]AP[facility]     11/15/22 appt noted: Dialogue with Dr. Shirlee Latch cardiologist agreed pt should be stable enough from CV perspective to receive Spravato which could elevate BP.  If that occurs use prn metoprolol 25 mg. Disc in detail with Dr. Corinda Gubler and he agrees [place] is appropriate though not FDA approved for bipolar  depression.  Disc 2 metanalysis in support of this and this alernative is safer and likely better tolerated than the option of ECT and faster than options of trials of Trintellix, Auvelity, Latuda etc. Tolerating meds.  She is depressed and very anxious and intermittently confused.  Taking clothes on and off bc rapid changes in comfort.  Worried over [employer].  Sleep disrupted appetite some better with mirtazapine. Received Spravato 56 mg first time today and was dissociated.  Resolved over 2 hours.  No HA, NV.  Was distressed moderately and very ambivalent about Tx.  Meghan Oliver and nurse supportive of tx plan and for the pt.    11/24/22 appt noted: Current meds: nortriptyline 50 mg HS, paroxetine 20 mg daily, lithium 300 mg nightly, mirtazapine 30 mg nightly, memantine 10 mg twice daily.   Lorazepam 0.5 mg every 4 to 6 hours as needed catatonia or severe anxiety. Received first dose of Spravato 84 mg today.  She was more noticeably dissociated than with the 56 mg dose.  It did resolve over the 2-hour course of observation although she remained somewhat unsteady and needed to leave via wheelchair.  She did not have headache nausea or vomiting.  She remains ambivalent about the treatment. Husband notes she has continued to be anxious and depressed and was extremely anxious yesterday and somewhat confused.  At times would say things that did not make sense.  He remained supportive of the treatment while she remains ambivalent about the treatment.  Her appetite is good and she is eating well.  Sleep is variable.  Meghan Oliver reported at times will be almost normal after receiving an extra dose of lorazepam 0.5 mg prn but this only lasts an hour or so. Plan: Continue Lithium 300 mg daily  Continue nortriptyline 50 mg daily Continue paroxetine 20 mg daily Continue vitamin B6 and primidone for tremor Imirtazapine 30 mg HS for appetite,  used for sleep and depression.  Appetite better Switch lorazepam to [facility] 2 mg AM and use lorazepam 0.5 mg prn.  For catatonia in hopes of more consistent relief.  Decision based on observation of periods of almost normalcy after some BZ doses.  We discussed potential switch of paroxetine and nortriptyline to Auvelity or Trintellix or possibly [place].  However these switches will be difficult and it is hoped that the Spravato may provide enough improvement to allow a switch in medicine more tolerable.  11/26/22 appt noted: Current meds: nortriptyline 50 mg HS, paroxetine 20 mg daily, lithium 300 mg nightly, mirtazapine 30 mg nightly, memantine 10 mg twice daily.  Lorazepam 0.5 mg every 4 to 6 hours as needed catatonia or severe anxiety.  Started Loreev 2 mg daily over weekend Received Spravato 84 mg today.   Spravato with expected dissociation.  She didn't experience  relief from negative emotion.  Dissociation resolved over observation period.  No N, V, HA.   Still high anxiety and dread of most everything.  Depressed with negative thought predominates.  Meghan Oliver notes periods of confusion and then maybe an hour of clarity sometimes after the dosing of lorazepam.  Then back to severe sx.  Eating is fair. No SI.  Fearful generally.  Needs help with ADLs.  Cannot take meds on her own. Tolerating meds without SE No noticeable effect from Loreev in terms of benefit or SE  12/01/22 appt noted: Received a phone call from nurse Meghan Oliver this morning to call as soon as possible this patient very agitated and confused.  Thrashing around  in bed.  At times talking about dying out of the fear that she was going to die.  Highly anxious.  Not very cooperative with drinking water or taking medicines this morning. Discussed the situation with her husband as well and we have to options: 1's if we can get her to the office to receive Spravato is scheduled today that may provide some immediate relief and forestall and may be prevent hospitalization.  The second option is to proceed directly to hospitalization.  If she is unable to take nutrition and hydration hospitalization will be necessary. Discussed that appeared to be catatonic hyperactivity and agitation which has not responded to the extended release lorazepam 2 mg a day.  Therefore she was given 1 mg of Ativan this morning and did calm down enough to make it to the Spravato administration this afternoon.  She was able to cooperate with Spravato administration and tolerated it well with typical levels of dissociation which gradually resolved over the 2 hours.  She was able to eat and drink fluids prior to coming to the appointment. Current meds: nortriptyline 50 mg HS, paroxetine 20 mg daily, lithium 300 mg nightly,  memantine 10 mg twice daily.  Lorazepam 0.5 mg every 4 to 6 hours as needed catatonia or severe anxiety.  Started Loreev 2 mg  daily over weekend olanazpine 2.5 mg HS Received Spravato 84 mg today.   Husband notes that after Spravato administration on Monday she was much improved for a period of several hours and was able to have conversations with her family members and make decisions about distributing some of her jewelry and other family decisions.  However over Monday evening into Tuesday she became more agitated and confused and anxious again.  There have been no obvious effects of taking the olanzapine but it does not appear that long-acting lorazepam 2 mg as provided any significant relief. Plan: Continue Lithium 300 mg daily  Continue nortriptyline 50 mg daily Continue paroxetine 20 mg daily Continue vitamin B6 and primidone for tremor DC mirtazapine For catatonia: lorazepam0.5 mg q 4 hour mg daily as tolerated for catatonia with dose spread through the day..  Nurse manages. Restart Loreeve 2 mg daily and use prn lorazepam for catatonia Increase olanzapine 5 mg HS for TRD, anxiety, catatonia, delusions of persecution  12/06/22 appt noted; Psych meds: as above except increase Loreev to 2 mg BID for severe catatonia. nortriptyline 50 mg HS, paroxetine  20 mg daily, lithium 300 mg nightly,  memantine 10 mg twice daily.  Lorazepam 0.5 mg every 4 to 6 hours as needed catatonia or severe anxiety.  Started Loreev 2 mg increased BID for severe catatonia,  increased olanazpine 5 mg HS for 3 days. Received 5th dose of Spravato 84 mg today.    She was able to cooperate with Spravato administration and tolerated it well with typical levels of dissociation which gradually resolved over the 2 hours.  She was able to eat and drink fluids prior to coming to the appointment.   She still feels confused and depressed and anxious. Per family had periods over the weekend where she was confused thinking her Meghan Oliver was dead even though he was right in front of her.  Also thinking she is going to die and not get better.  Is eating and drinking  fluids well.  Has needed extra lorazepam to sleep.  Limited periods of lucidity per Meghan Oliver. Tolerating high dose lorazepam but limited benefit for catatonia. Plan: Continue Lithium 300 mg daily  reduce nortriptyline 25 mg daily in anticipation of change Reduce paroxetine 10 mg daily in anticipation of change Continue vitamin B6 and primidone for tremor For catatonia: lorazepam0.5 mg q 4 hour mg daily as tolerated for catatonia with dose spread through the day..  Nurse manages. DT severity of catatonic hyperactivy Loreeve 2 mg BID and use prn lorazepam for catatonia Continue olanzapine 5 mg HS for TRD, anxiety, catatonia, delusions of persecution  12/08/22 appt noted: Psych meds:  Loreev to 2 mg BID for severe catatonia. nortriptyline 25 mg HS, paroxetine 10 mg daily, lithium 300 mg nightly,  memantine 10 mg twice daily.  Lorazepam 0.5 mg every 4 to 6 hours as needed catatonia or severe anxiety.  Started Loreev 2 mg increased BID for severe catatonia,  increased olanazpine 5 mg HS for 3 days. Received 6th dose of Spravato 84 mg today.    She was able to cooperate with Spravato administration and tolerated it well with typical levels of dissociation which gradually resolved over the 2 hours.  She was able to eat and drink fluids prior to coming to the appointment.   She still feels confused and depressed and anxious. Hopeless.  Thoughts she is going to die. Per nurse and Meghan Oliver no sig improvement noted with spravato.   No current alcohol problems  01/14/2023 appointment on an urgent basis: Hospitalized due to psychiatry at [facility] from 12/10/2022 until 01/03/2023.  Hospitalized for bipolar disorder severe depression with catatonia.  Received ECT and Latuda 60 mg daily was started.  Stopped paroxetine, lithium, nortriptyline, and mirtazapine.  Lorazepam was switched to clonazepam. Current psych meds clonazepam 0.5 mg twice daily, Latuda 60 mg daily.  Memantine 10 mg twice daily, weekly ECT rec Wt 101# and  better appetite.  Sleep not great but ok.  Will lie awake some.  Meghan Oliver says it's pretty good. Alert in the day.  No SE with meds.  Tremor better.   Lorazepam only on ECT days. Plan:  For catatonia: Continue clonazepam 0.5 mg twice daily also for anxiety Continue Latuda 60 mg daily Continue lorazepam 0.5 mg as needed which is currently being used rarely. Recommend continue ECT  weekly as [facility] recommends  02/22/23    [employer] nurse called at 11am.  Alysha is doing better, receiving ECT twice this week. Not having breakthrough anxiety now and Meghan Oliver wants to decrease her morning dose of Klonopin to help decrease her sedation. She wants to give her 1/2 of 0.5mg  Klonopin in the am and just give her regular dose the other two times a day. Is this okay.        Electronically signed by Soundra Pilon L at 02/22/2023 11:21 AM  03/11/23 appt:  Current psych med:  clonazepam 0.25 mg AM and 0.5 mg HS on 5/21, Latuda 60,  Worry anxiety.  Not as happy as those around here. Reduced ECT to weekly does not maintain the benefit.  But they didn't want to continue it.   Meghan Oliver has  talked with ECT doc about holding off on ECT and missed dose but she's gotten more depressed.   Did twice weekly for awhle and it was beneficial.  Sched for Tuesday Plan: Start Trintellix off label for dep.  Trying to find solution that would allow stopping ECT Recommend continue ECT  but increase to twice weekly as [facility] recommends  04/15/23 appt noted:  with nurse and Meghan Oliver Reduced clonazepam to 0.25 mg AM and 0.5 mg HS, Latuda 60, incr Trintellix  10 ECT once or twice weekly.  Took 13 day holiday with ECT and progressively downhill.  Worse if goes more than a week without ECT.   Some interest but not much initiative.  Read paper some.  Can initiate conversation with nurse with some recent details.  Still other memory problems frustrating to everyone.   Some memory px even before ECT but worse. Sleep good.  Feels good today and  appetite ok.   D been at the house lately.  D from [place] No excessive sleep.   She naps some easily in the day.  No sig changes with less clonazepam.  Minimal effect from counseling DT memory. No new concerns.   No SE.    05/20/23 appt noted: Tolerated increase in Trintellix to 15 mg daily.  Reduced clonazepam 0.25 mg AM and 0.375 mg HS. No prn lorazepam.  No dep verbalized since here.  Trouble with sleep.  Intial insomnia.  No caffeine.  Some napping.  Not drowsy now.  Will lay on couch after breakfast.   Not anxious about anything.  Sleep trouble in the last few days. Last ECT a little over 2 weeks to wait another week if possible.  Meghan Oliver thinks she is more awake with less ECT.  Will spontaneously seek out paper and fix several. Where do we go from here?   Per Meghan Oliver .  Wants her to be able to stop ECT. Nurse notes better exec function.  Nurse has seen some lack of motivation and little spontaneous speech.  Won't engage in conversation much per family.   Still some rumination.   She has no other complaints other than some physical concerns.  Plan: For catatonia: clonazepam to 0.25 mg AM and 0.375 mg HS for 2 weeks then 0.25 mg BID for cog reasons and not currently catatonic Continue Latuda 60 mg daily Continue lorazepam 0.5 mg as needed which is currently being used rarely. Incr Trintellix to 20 mg daily off label for bipolar dep.  Trying to find solution that would allow stopping ECT Recommend continue ECT  but increase to twice weekly as [facility] recommends but considering reduction to every 3 weeks is reasonable.  She is not dep now.  06/24/23 appt noted: urgent appt with Meghan Oliver and nurse. Tolerated increase in Trintellix to 20 mg daily.  Reduced clonazepam 0.25 mg AM and 0.375 mg HS. No prn lorazepam, mirtazapine 7.5 mg HS, Latuda 60.  Memantine 10 BID Last eCT Monday every 3-3&1/2 weeks.   Mood and anxiety dipped with time away from ECT.   ECT still helpful.  Away from ECT harder to read and watch TV.    [facility] wants 2 week interval. Minimal initiation of conversation with others.   Not sure what to say in conversation.   General malaise over the last 10 days. Appetite is fair.  D Clydie Braun came last weekend and was helpful.  Not dep today but was before ECT this week.  Meds have not prevented recurrence. Meghan Oliver asks about restarting lithium which helped in the past.  07/25/23 appt : seen with nurse Last ECT 07/19/23.   Lately weekly bc sx got worse.  Nurse has seen improvement.  [facility] saying it is helpful. Added Concerta 18 mg AM.  No SE.  Nurse noticed a little more attempt to conc but not a lot of benefit.   No prn lorazepam needed.   Meghan Oliver says she's sleepy a lot of the time.  Nurse suspects clonazepam as the source of sleepiness.  No benefit with more clonazepam. Sleep helped by mirtazapine 7.5 mg HS. No SE with Trintellix 20 mg  Apppetite pretty normal.   3 days without primidone but tremor noticeably worse. Planning to go to [facility] for T'giving week. Meghan Oliver using cane and less mobile. Has assistance at home for parts of the day.  Uses Rolator at night when gets up to urinate.  08/26/23 appt noted:  with nurse meds No sig dep nor anxiety.  Sleep good.  Untreated OSA per family.   Per nurse more effort with things with increase concerta 27 AM.  No SE.   Weekly ECT ongoing.  Will be 11 days . Going to [facility] for a week for T'giving.  Looking forward to seeing g'kids.   No prn lorazepam needed.   Has pacemaker for heart rate.   ECT clearly helping.   Meghan Oliver agrees gradually improving.    Appetite ok.  Wt consistent 97-99#.   Neuro FU for eval cog 09/14/23.   Needs help with ADLs but less than before.     ECT-MADRS    Flowsheet Row Clinical Support from 12/08/2022 in [facility]  MADRS Total Score 54      PHQ2-9    Flowsheet Row Office Visit from 01/14/2023 in [facility], MD Office Visit from 10/11/2022 in [facility], MD Office Visit from 08/17/2022 in [facility], MD Office Visit from 07/19/2022 in [facility], MD Office Visit from 03/24/2022 in [facility], MD  PHQ-2 Total Score 4 6 0 0 1  PHQ-9 Total Score 14 -- -- -- --      Flowsheet Row ED to Hosp-Admission (Discharged) from 07/01/2022 in [facility] Admission (Discharged) from 05/18/2022 in [place] [facility] Pre-Admission Testing 60 from 05/10/2022 in [facility]  C-SSRS RISK CATEGORY No Risk No Risk No Risk        Past Psychiatric Medication Trials:  Prozac 40 with lithium and Zyprexa 2.5 to 5 mg,  nortriptyline, paroxetine, Effexor 225 Wellbutrin 300 mirtazapine,  Others  Pramipexole 0.625 daily hallucinations  Rexulti EPS, Abilify 15, Vraylar 1.5 1 week,  Latuda 10 mg SE, Olanzapine 10 briefly Caplyta briefly no response   Lamotrigine level 7.5  Namenda,  lorazepam,  Clonazaam 0.5 BID  no response Spravato after 6 tx of 84 mg and 2 of 56 mg .  Hospitalized at [facility] on psychiatric unit from 12/10/2022 to 01/03/2023 and received ECT and started Latuda 60 mg daily.  Stopped paroxetine, nortriptyline, and lithium.  this list is not exhaustive  Review of Systems:  Review of Systems  Constitutional:  Positive for fatigue. Negative for appetite change.  Cardiovascular:  Negative for chest pain and palpitations.  Gastrointestinal:  Negative for nausea.       Linzess and Mozantic managed   Musculoskeletal:  Positive for gait problem.  Neurological:  Positive for dizziness and weakness. Negative for tremors.       Some balance issues  Psychiatric/Behavioral:  Positive for decreased concentration. Negative for agitation, behavioral problems, confusion, dysphoric mood, hallucinations, self-injury, sleep disturbance and suicidal ideas. The patient is not nervous/anxious and is not hyperactive.     Medications: I have reviewed the patient's current medications.  Current Outpatient  Medications  Medication Sig Dispense Refill   amLODipine (NORVASC) 10 MG tablet Take 1 tablet (10 mg total) by mouth daily. 90 tablet 1   aspirin 81 MG chewable tablet Chew 1  tablet (81 mg total) by mouth daily.     Calcium Carbonate-Vitamin D (CALCIUM 600/VITAMIN D PO) Take 1 tablet by mouth 2 (two) times daily.     cetirizine (ZYRTEC) 10 MG tablet Take 10 mg by mouth as needed for allergies.     Cholecalciferol (VITAMIN D3) 50 MCG (2000 UT) TABS Take 2,000 Units by mouth in the morning.     clonazePAM (KLONOPIN) 0.5 MG tablet Take 1 tablet (0.5 mg total) by mouth 2 (two) times daily. 180 tablet 0   cloNIDine (CATAPRES) 0.1 MG tablet Take 1 tablet (0.1 mg total) by mouth as needed prior to ECT treatment 20 tablet 2   COVID-19 mRNA vaccine 2023-2024 (COMIRNATY) syringe Inject into the muscle. 0.3 mL 0   dexlansoprazole (DEXILANT) 60 MG capsule Take 1 capsule by mouth daily 90 capsule 3   glycerin adult 2 g suppository Use 1 suppository as needed if you have not had a bowel movement in  4 to 5 days 25 suppository 0   hydrochlorothiazide (HYDRODIURIL) 12.5 MG tablet Take 1 tablet (12.5 mg total) by mouth daily. 90 tablet 1   influenza vaccine adjuvanted (FLUAD QUADRIVALENT) 0.5 ML injection Inject into the muscle. 0.5 mL 0   Krill Oil 500 MG CAPS Take 500 mg by mouth daily.     lactulose (CHRONULAC) 10 GM/15ML solution Take 30 mLs (20 g total) by mouth 3 (three) times daily between meals as needed for mild constipation. (Patient taking differently: Take 20 g by mouth daily as needed for mild constipation.) 473 mL 3   Lavender Oil 80 MG CAPS Take 160 mg by mouth at bedtime.     levothyroxine (SYNTHROID) 75 MCG tablet Take 1 tablet (75 mcg total) by mouth daily. 90 tablet 2   levothyroxine (SYNTHROID) 75 MCG tablet Take 1 tablet (75 mcg total) by mouth daily. 90 tablet 2   levothyroxine (SYNTHROID) 75 MCG tablet Take 1 tablet (75 mcg total) by mouth daily. 90 tablet 2   linaclotide (LINZESS) 72 MCG  capsule Take 1 capsule (72 mcg total) by mouth daily before breakfast. 180 capsule 0   LORazepam (ATIVAN) 0.5 MG tablet TAKE 2 TABLETS (1 MG)  BY MOUTH EVERY NIGHT AT BEDTIME AND 1 TABLET EVERY 4 HOURS AS NEEDED FOR ANXIETY 270 tablet 0   losartan (COZAAR) 100 MG tablet Take 1 tablet (100 mg total) by mouth daily. 90 tablet 1   Lurasidone HCl 60 MG TABS Take 1 tablet (60 mg total) by mouth daily after supper. 90 tablet 0   magic mouthwash w/lidocaine SOLN Take 5 mLs by mouth 3 (three) times daily as needed for mouth pain. 120 mL 0   memantine (NAMENDA) 10 MG tablet Take 1 tablet (10 mg total) by mouth 2 (two) times daily. 180 tablet 1   metoprolol tartrate (LOPRESSOR) 25 MG tablet Take 1 tablet (25 mg total) by mouth as needed. 45 tablet 3   mirtazapine (REMERON) 7.5 MG tablet Take 1 tablet (7.5 mg total) by mouth at bedtime. 90 tablet 0   Multiple Vitamins-Minerals (MULTIVITAMIN WITH MINERALS) tablet Take 1 tablet by mouth daily.     Multiple Vitamins-Minerals (PRESERVISION AREDS 2 PO) Take 1 capsule by mouth in the morning and at bedtime.     naloxegol oxalate (MOVANTIK) 12.5 MG TABS tablet Take 2 tablets (25 mg total) by mouth daily. (Patient taking differently: Take 25 mg by mouth daily. PRN) 60 tablet 3   neomycin-polymyxin b-dexamethasone (MAXITROL) 3.5-10000-0.1 OINT Apply thin line (1 cm)  of ointment to right eye twice daily for 1 week. 3.5 g 1   ofloxacin (OCUFLOX) 0.3 % ophthalmic solution Instill 1 drop into right eye four times daily for 7 days. 5 mL 1   omeprazole-sodium bicarbonate (ZEGERID) 40-1100 MG capsule Take 1 capsule by mouth daily before breakfast. 90 capsule 3   ondansetron (ZOFRAN-ODT) 4 MG disintegrating tablet Place 1 tablet every 6 hours by translingual route as needed. (Patient taking differently: Take by mouth as needed.) 20 tablet 0   pneumococcal 20-valent conjugate vaccine (PREVNAR 20) 0.5 ML injection Inject into the muscle. 0.5 mL 0   polyethylene glycol (MIRALAX /  GLYCOLAX) 17 g packet Take 17 g by mouth as needed for mild constipation.     primidone (MYSOLINE) 50 MG tablet Take 2 tablets (100 mg total) by mouth every morning AND 1 tablet (50 mg total) at bedtime. 270 tablet 0   RSV vaccine recomb adjuvanted (AREXVY) 120 MCG/0.5ML injection Inject into the muscle. 0.5 mL 0   sennosides-docusate sodium (SENOKOT-S) 8.6-50 MG tablet Take 2 tablets by mouth as needed.     thiamine (VITAMIN B-1) 100 MG tablet Take 1 tablet (100 mg total) by mouth daily. 100 tablet 3   valACYclovir (VALTREX) 1000 MG tablet Take 2 tablets by mouth at onset, then 2 tablets 12 hours later. (Patient taking differently: Take 2,000 mg by mouth as needed. PRn) 30 tablet 1   vitamin B-12 (CYANOCOBALAMIN) 1000 MCG tablet Take 1,000 mcg by mouth at bedtime.     vortioxetine HBr (TRINTELLIX) 20 MG TABS tablet Take 1 tablet (20 mg total) by mouth daily. 90 tablet 0   methylphenidate 36 MG PO CR tablet Take 1 tablet (36 mg total) by mouth daily. 90 tablet 0   No current facility-administered medications for this visit.    Medication Side Effects: Other: tremor  stable.  No worse.  Allergies:  Allergies  Allergen Reactions   Propranolol Other (See Comments)    Low blood pressure. Bradycardia    Brexpiprazole Other (See Comments)    Aphasia and catatonia    Past Medical History:  Diagnosis Date   Anxiety    Arthritis    "hips, spine" (03/17/2018)   Bipolar II disorder (HCC)    CHF (congestive heart failure) (HCC)    Chronic bronchitis (HCC)    Chronic lower back pain    Chronic right hip pain    CKD (chronic kidney disease), stage II    Coronary artery disease    stent x1   Esophagitis, erosive    GAD (generalized anxiety disorder)    GERD (gastroesophageal reflux disease)    Headache    "maybe monthly" (03/17/2018))   Heart murmur, systolic    History of adenomatous polyp of colon    08-04-2016  tubular adenoma   History of blood transfusion 12/2017   "related to  vascular hematoma"   History of electroconvulsive therapy    at [facility]--  started 04-15-2015 to 11-17-2016  total greater than 40 times   History of hiatal hernia    Hyperlipidemia    Hypertension    Hypothyroidism    Internal carotid artery stenosis, bilateral    per last duplex 05-01-2014  bilateral ICA 40-59%   Major depression, chronic    ECT treatments extensive and multiple started 07/ 2016   Memory loss    "both short and long-term; needs frequent reminders to follow instrucitons" (05/16/2017)   Migraines    "none in years" (03/17/2018)   OSA (  obstructive sleep apnea)    per study 06/ 2012 moderate OSA  ; "refuses to wear masks" (03/17/2018)   Osteoporosis    Pneumonia 07/29/2022   Poor historian    due to short term memory loss   Presence of permanent cardiac pacemaker 03/17/2018   Pulmonary nodule    monitored by pcp   S/P placement of cardiac pacemaker 03/17/18 [facility]  03/18/2018   Short-term memory loss    Sick sinus syndrome (HCC)     Family History  Problem Relation Age of Onset   Heart attack Father 71       deceased   Hypertension Father    Heart disease Father    Dementia Brother    Parkinson's disease Brother    Heart disease Brother    Breast cancer Paternal Aunt        Age 9's   Breast cancer Paternal Grandmother        Age unknown   Colon cancer Neg Hx     Social History   Socioeconomic History   Marital status: Married    Spouse name: Dr. Victorino Dike   Number of children: 2   Years of education: Not on file   Highest education level: Not on file  Occupational History   Occupation: housewife    Employer: UNEMPLOYED  Tobacco Use   Smoking status: Former    Current packs/day: 0.00    Average packs/day: 2.0 packs/day for 15.0 years (30.0 ttl pk-yrs)    Types: Cigarettes    Start date: 01/13/1956    Quit date: 01/13/1971    Years since quitting: 52.6    Passive exposure: Never   Smokeless tobacco: Never  Vaping Use   Vaping status: Never  Used  Substance and Sexual Activity   Alcohol use: Yes    Comment: occassional 1 x a week   Drug use: Never   Sexual activity: Not Currently    Comment: intercourse age 57, sexual partners less than 5  Other Topics Concern   Not on file  Social History Narrative   Right handed   Social Determinants of Health   Financial Resource Strain: Low Risk  (12/12/2022)   Received from [facility], [facility]   Overall Financial Resource Strain (CARDIA)    Difficulty of Paying Living Expenses: Not hard at all  Food Insecurity: No Food Insecurity (12/12/2022)   Received from [facility], [facility]   Hunger Vital Sign    Worried About Running Out of Food in the Last Year: Never true    Ran Out of Food in the Last Year: Never true  Transportation Needs: No Transportation Needs (12/12/2022)   Received from [facility], [facility]   [facility] - Transportation    In the past 12 months, has lack of transportation kept you from medical appointments or from getting medications?: No    Lack of Transportation (Non-Medical): No  Physical Activity: Not on file  Stress: Not on file  Social Connections: Not on file  Intimate Partner Violence: Not At Risk (07/06/2022)   Humiliation, Afraid, Rape, and Kick questionnaire    Fear of Current or Ex-Partner: No    Emotionally Abused: No    Physically Abused: No    Sexually Abused: No    Past Medical History, Surgical history, Social history, and Family history were reviewed and updated as appropriate.   Please see review of systems for further details  on the patient's review from today.   Objective:   Physical Exam:  LMP  (LMP Unknown)   Physical Exam Constitutional:      General: She is not in acute distress.    Appearance: She is well-developed.  Musculoskeletal:        General: No deformity.  Neurological:     Mental Status: She is alert  and oriented to person, place, and time.     Motor: No tremor.     Coordination: Coordination abnormal.     Comments: Cane use  Psychiatric:        Attention and Perception: She is attentive.        Mood and Affect: Mood is anxious. Mood is not depressed. Affect is not labile, blunt or inappropriate.        Speech: Speech is not slurred.        Behavior: Behavior normal. Behavior is not slowed, withdrawn or combative.        Thought Content: Thought content is not delusional. Thought content does not include homicidal or suicidal ideation. Thought content does not include suicidal plan.        Cognition and Memory: Cognition is impaired. She exhibits impaired recent memory.        Judgment: Judgment normal.     Comments: Insight fair affected by STM px. No auditory or visual hallucinations.  Depression resolved.  anxiety remarkably improved Delusional guilt not elicited Affect pleasant and smiling . Minimal spontaneous speech     Lab Review:     Component Value Date/Time   NA 139 06/27/2023 1242   NA 137 12/29/2022 0000   K 3.8 06/27/2023 1242   CL 102 06/27/2023 1242   CO2 22 06/27/2023 1242   GLUCOSE 63 (L) 06/27/2023 1242   BUN 20 06/27/2023 1242   BUN 12 12/29/2022 0000   CREATININE 0.77 06/27/2023 1242   CALCIUM 9.5 06/27/2023 1242   PROT 6.8 06/27/2023 1242   PROT 6.2 04/20/2017 1151   ALBUMIN 3.9 09/02/2022 1221   ALBUMIN 3.9 04/20/2017 1151   AST 18 06/27/2023 1242   Oliver 17 06/27/2023 1242   ALKPHOS 64 09/02/2022 1221   BILITOT 0.4 06/27/2023 1242   BILITOT <0.2 04/20/2017 1151   GFRNONAA >60 07/08/2022 0429   GFRNONAA 50 (L) 07/10/2020 1021   GFRAA 58 (L) 07/10/2020 1021       Component Value Date/Time   WBC 7.0 06/27/2023 1242   RBC 4.13 06/27/2023 1242   HGB 12.0 06/27/2023 1242   HGB 10.2 (L) 04/20/2017 1151   HCT 37.7 06/27/2023 1242   HCT 29.6 (L) 04/20/2017 1151   PLT 241 06/27/2023 1242   PLT 385 (Meghan Oliver) 04/20/2017 1151   MCV 91.3 06/27/2023 1242    MCV 89 04/20/2017 1151   MCH 29.1 06/27/2023 1242   MCHC 31.8 (L) 06/27/2023 1242   RDW 14.5 06/27/2023 1242   RDW 13.9 04/20/2017 1151   LYMPHSABS 2,310 06/27/2023 1242   LYMPHSABS 1.5 04/20/2017 1151   MONOABS 0.7 07/07/2022 0458   EOSABS 91 06/27/2023 1242   EOSABS 0.3 04/20/2017 1151   BASOSABS 42 06/27/2023 1242   BASOSABS 0.0 04/20/2017 1151    Lithium Lvl  Date Value Ref Range Status  10/21/2022 0.5 (L) 0.6 - 1.2 mmol/L Final  10/21/2022 lithium level 0.5 on 300 mg nightly is stable.,  Nortriptyline 115 on 75 mg HS.  08/02/22 nortriptyline 85, lihtium 0.5  Nortriptyline level 51 on 50 mg nightly with paroxetine 20 mg daily  07/10/2020 lithium level 0.6 at [facility] office and nortriptyline level 130 on the current dosages of lithium 300 mg nightly and nortriptyline 75 mg nightly  02/20/2021 labs lithium 0.6 on 300 mg nightly.  Nortriptyline level 49 which is lower than expected on 75 mg nightly along with paroxetine 20 mg daily.    No results found for: "PHENYTOIN", "PHENOBARB", "VALPROATE", "CBMZ"   .res Assessment: Plan:    Severe bipolar I disorder, current or most recent episode depressed, with catatonia (HCC) - Plan: methylphenidate 36 MG PO CR tablet  Generalized anxiety disorder  Memory loss  Insomnia due to mental condition  Essential tremor    History of lithium toxicity repeatedly while on chlorthalidone. No tremor px off of the lithium.    30-minute visit today with Meghan Oliver and nurse present..  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  Recent decompensation and rapidly getting worse with some cognive impairment and mild psychotic sx and altered mental status. Her case has been highly complex and treatment resistant at times.  No current catatonia but is somewhat anxious easily but only with changes.  No lorazepam needed.Marland Kitchen  And worse with reduction of clonazepam without improvement in cog with reduction. So  increased it back to 0.5  mg BID but balance issues and sleepiness so reducing back to 0.25 mg BID.    She had remarkedly improved with the ECT + change in psychiatric medicine to Latuda 60 mg daily with clonazepam 0.5 mg twice daily. But they would like to get her meds to the point she could stop ECT bc cog problems which are ongoing.  She is not ruminative.  Affect is bright.  Dep resolved including anhedonia largely with ECT.  Catatonia is resolved .  Cog is better with skipping a couple of weeks of ECT but is not normal with STM px and not very talkative socially  she can recall events of the last few days..   She is still anxious and somewhat insecure.  Short-term memory impairment is consistent with what would be expected with the ECT at this time. ECT is weekly at [facility].  options of trials of Trintellix, Auvelity, etc. Reviewed the old chart and consider very low-dose Vraylar   Previously Disc Dr. Blossom Hoops concerns about longterm ECT on cognition.  Disc therapy for her  through Dr. Terrall Laity  3-4 times.  Didn't seem to help.  Seeing another person at [facility].  Trouble with memory interferes with some of the benefit.  For catatonia and anxiety increase clonazepam to 0.25 mg AM and 0.375 mg PM .  May try to reduce it after they come back from [facility]  Continue Latuda 60 mg daily Continue lorazepam 0.5 mg as needed which is currently being used rarely. continue Trintellix to 20 mg daily off label for bipolar dep.  Trying to find solution that would allow stopping ECT  We discussed the short-term risks associated with benzodiazepines including sedation and increased fall risk among others.  Discussed long-term side effect risk including dependence, potential withdrawal symptoms, and the potential eventual dose-related risk of dementia.  But recent studies from 2020 dispute this association between benzodiazepines and dementia risk. Newer studies in 2020 do not support an association with dementia.  increase off label  Concerta DT no sig change and no SE at 27 mg so increase to 36 mg AM for TRD and cog problems [place] effects from the 27 mg. Discussed potential benefits, risks, and side effects of stimulants with patient to include  increased heart rate, palpitations, insomnia, increased anxiety, increased irritability, or decreased appetite or mania, agitation.   Instructed patient and family to contact office if experiencing any significant tolerability issues.  Discussed potential metabolic side effects associated with atypical antipsychotics, as well as potential risk for movement side effects. Advised pt to contact office if movement side effects occur.   Recommend continue ECT  weekly as [facility] recommends.  She is not dep now but it tends to recur between ECT if treatments are spread out.  Disc option of return to lithium bc it helped in the past  but does have more potential SE bc had tremor with it before.  FU 4 weeks  Meredith Staggers MD, DFAPA Please see After Visit Summary for patient specific instructions.  Future Appointments  Date Time Provider Department Center  09/14/2023  9:45 AM [facility] NURSE [facility] None  09/14/2023 10:00 AM Marcos Eke, PA-C [facility] None  09/23/2023 10:00 AM Cottle, Steva Ready., MD [facility] None  10/03/2023  7:00 AM [facility] DEVICE REMOTES [facility]  01/02/2024  7:00 AM [facility] DEVICE REMOTES [facility]  04/02/2024  7:00 AM [facility] DEVICE REMOTES [facility]  07/02/2024  7:00 AM [facility] DEVICE REMOTES [facility]  10/01/2024  7:00 AM [facility] DEVICE REMOTES [facility]  12/31/2024  7:00 AM [facility] DEVICE REMOTES [facility]  04/01/2025  7:00 AM [facility] DEVICE REMOTES [facility]  07/01/2025  7:00 AM [facility] DEVICE REMOTES [facility]  09/30/2025  7:00 AM [facility] DEVICE REMOTES [facility]      No orders of the  defined types were placed in this encounter.      -------------------------------

## 2023-09-05 ENCOUNTER — Other Ambulatory Visit (HOSPITAL_BASED_OUTPATIENT_CLINIC_OR_DEPARTMENT_OTHER): Payer: Self-pay

## 2023-09-06 ENCOUNTER — Other Ambulatory Visit (HOSPITAL_BASED_OUTPATIENT_CLINIC_OR_DEPARTMENT_OTHER): Payer: Self-pay

## 2023-09-06 DIAGNOSIS — I2510 Atherosclerotic heart disease of native coronary artery without angina pectoris: Secondary | ICD-10-CM | POA: Diagnosis not present

## 2023-09-06 DIAGNOSIS — F419 Anxiety disorder, unspecified: Secondary | ICD-10-CM | POA: Diagnosis not present

## 2023-09-06 DIAGNOSIS — E039 Hypothyroidism, unspecified: Secondary | ICD-10-CM | POA: Diagnosis not present

## 2023-09-06 DIAGNOSIS — N189 Chronic kidney disease, unspecified: Secondary | ICD-10-CM | POA: Diagnosis not present

## 2023-09-06 DIAGNOSIS — G4730 Sleep apnea, unspecified: Secondary | ICD-10-CM | POA: Diagnosis not present

## 2023-09-06 DIAGNOSIS — I5032 Chronic diastolic (congestive) heart failure: Secondary | ICD-10-CM | POA: Diagnosis not present

## 2023-09-06 DIAGNOSIS — K219 Gastro-esophageal reflux disease without esophagitis: Secondary | ICD-10-CM | POA: Diagnosis not present

## 2023-09-06 DIAGNOSIS — F332 Major depressive disorder, recurrent severe without psychotic features: Secondary | ICD-10-CM | POA: Diagnosis not present

## 2023-09-06 DIAGNOSIS — J449 Chronic obstructive pulmonary disease, unspecified: Secondary | ICD-10-CM | POA: Diagnosis not present

## 2023-09-07 ENCOUNTER — Other Ambulatory Visit (HOSPITAL_BASED_OUTPATIENT_CLINIC_OR_DEPARTMENT_OTHER): Payer: Self-pay

## 2023-09-07 ENCOUNTER — Other Ambulatory Visit: Payer: Self-pay | Admitting: Psychiatry

## 2023-09-07 DIAGNOSIS — F3132 Bipolar disorder, current episode depressed, moderate: Secondary | ICD-10-CM

## 2023-09-07 DIAGNOSIS — H353211 Exudative age-related macular degeneration, right eye, with active choroidal neovascularization: Secondary | ICD-10-CM | POA: Diagnosis not present

## 2023-09-07 MED ORDER — OFLOXACIN 0.3 % OP SOLN
1.0000 [drp] | Freq: Three times a day (TID) | OPHTHALMIC | 0 refills | Status: AC
  Filled 2023-09-07: qty 5, 3d supply, fill #0

## 2023-09-07 MED ORDER — PRIMIDONE 50 MG PO TABS
ORAL_TABLET | ORAL | 0 refills | Status: DC
  Filled 2023-09-07 – 2023-10-20 (×3): qty 270, 90d supply, fill #0

## 2023-09-08 ENCOUNTER — Other Ambulatory Visit: Payer: Self-pay

## 2023-09-08 ENCOUNTER — Other Ambulatory Visit (HOSPITAL_BASED_OUTPATIENT_CLINIC_OR_DEPARTMENT_OTHER): Payer: Self-pay

## 2023-09-11 NOTE — Progress Notes (Addendum)
Assessment/Plan:     Meghan Oliver is a delightful 82 y.o. year old RH female with a history of hypertension, hyperlipidemia, bipolar disorder, depression, insomnia, hypothyroidism, sinus node dysfunction/bradycardia s/p PMP 20219 ([employer]), chronic systolic heart failure, OSA not on CPAP, GERD, and a diagnosis of moderate dementia of unspecified etiology as per Neuropsych evaluation Jan 2019  seen today for evaluation of worsening memory loss. MoCA today is 12/30-very effortful test.  At this time, the etiology is unclear however, she does report a history of Parkinson's dementia and Lewy body disease in the family.  She has a history of tremors treated with primidone, on exam, her stride is short and mild shuffling is noted.  She also flexes forward, and hypomimia is seen.  MRI of the brain will be ordered, and at on point DAT scan may be considered.  Recommendations are as follows    Memory Impairment  MRI brain without contrast to assess for underlying structural abnormality and assess vascular load  1 h EEG given her extensive history of ECT (greater than 230 sessions) Recommend good control of cardiovascular risk factors.   Continue to control mood as per PCP Folllow up in 1 month to discuss the results and further plans  Tremors, lithium induced versus Parkinson's Patient has been on lithium for a long time, currently on primidone 50 mg in the morning and 100 mg at night, tolerating it well. Tremors are not fully controlled, and short stride, mild shuffling has been noted on exam, along with some hypomania.  Concern for PD is present.  Will monitor until the next visit, may require further testing.    Subjective:    The patient is accompanied by her nurse [name] and her husband who supplements the history.   Substantial memory issues over the last year  How long did patient have memory difficulties?  Nurse reports that they have been substantial memory issues over the last year,  worse over the last 2 months, following a catatonic state due to memory issues (not to behavioral history).  She has greater than 230 sessions of ECT, currently at Q 2 week period.  Within the last 6 weeks, she has more difficulty remembering new information, recent conversations, names.  She needs more time to think about her answers especially over the last 6 to 9 months "no more than 200 words a day "-nurse says.  Sometimes it is disorientation with time, does not know morning for night. repeats oneself?  Endorsed "why are we going to x, y or z", "or she repeats a request till we get past that "  Disoriented when walking into a room?  Sometimes you get disoriented where the rooms are, for example her bedroom.   Leaving objects in unusual places? Denies.   Wandering behavior? Denies. She has 24/7 care but could easily get lost if left alone Any personality changes, or depression, anxiety? She has a history of severe BP disorder and MDD followed by psychiatry  ([name]).  She has been more withdrawn, not participating in activities that she used to.  She used to be very sociable before. Hallucinations or paranoia? denies   Seizures? denies.  Frank seizures, although she does get disoriented at certain.  As of the day or night. Any sleep changes?  If she does not have stimulation, she will sleep a lot.  Denies vivid dreams, REM behavior or sleepwalking  . "Increased disorientation and night and confusion in the morning"-nurse says. Sleep apnea?  Endorsed, not on CPAP Any hygiene concerns?  Needs to be reminded to shower, needs assistance with decision making to get dress, not an exact black or white issue.  Independent of bathing and dressing? Endorsed  Does the patient need help with medications?  Nurse is in charge   Who is in charge of the finances?  Husband is in charge     Any changes in appetite? Currently is good, "it was bad during the severe depression ".      Patient have trouble  swallowing?  Denies.   Does the patient cook? No  Any headaches? She has intermittent temporal headache history. .   Chronic pain? Denies.  "Takes tylenol for everything", needs monitoring.  Ambulates with difficulty?  She has some gait instability, does PT. Needs a walker or Rolator as well, especially at night at bedside   Recent falls or head injuries? Denies.     Vision changes?  She has a history of macular degeneration, takes Avastin injections  Any strokelike symptoms? Denies.   Any tremors ?Endorsed, worse with anxiety, on primidone.  She denies having trouble buttoning.  She is able to eat by herself.  Denies choking with food. Any anosmia? Denies.   Any incontinence of urine? OAB, "no real incontinence. No longer on meds for that "she asks to go forgetting that she just went .  She has a history of recurrent UTI that goes in phases  Any bowel dysfunction? Chronic constipation on Linzess  Patient lives at [facility] [place]     History of heavy alcohol intake1/2 glass wine once monthly. 10 y ago she consumed "a lot more" History of heavy tobacco use? No longer smokes  Family history of dementia?  Brother had Parkinson's dementia and had a rather with LBD. Does patient drive? Not for 15 yrs    College degree      Allergies  Allergen Reactions   Propranolol Other (See Comments)    Low blood pressure. Bradycardia    Propranolol Hcl Other (See Comments)   Brexpiprazole Other (See Comments)    Aphasia and catatonia    Current Outpatient Medications  Medication Instructions   amLODipine (NORVASC) 10 mg, Oral, Daily   aspirin 81 mg, Oral, Daily   Calcium Carbonate-Vitamin D (CALCIUM 600/VITAMIN D PO) 1 tablet, Oral, 2 times daily   cetirizine (ZYRTEC) 10 mg, Oral, As needed   clonazePAM (KLONOPIN) 0.5 mg, Oral, 2 times daily   cloNIDine (CATAPRES) 0.1 MG tablet Take 1 tablet (0.1 mg total) by mouth as needed prior to ECT treatment   COVID-19 mRNA vaccine 2023-2024 (COMIRNATY)  syringe Intramuscular   cyanocobalamin (VITAMIN B12) 1,000 mcg, Daily at bedtime   dexlansoprazole (DEXILANT) 60 MG capsule Take 1 capsule by mouth daily   glycerin adult 2 g suppository Use 1 suppository as needed if you have not had a bowel movement in  4 to 5 days   hydrochlorothiazide (HYDRODIURIL) 12.5 mg, Oral, Daily   influenza vaccine adjuvanted (FLUAD QUADRIVALENT) 0.5 ML injection Intramuscular   Krill Oil 500 mg, Daily   lactulose (CHRONULAC) 20 g, Oral, 3 times daily between meals PRN   Lavender Oil 160 mg, Oral, Daily at bedtime   levothyroxine (SYNTHROID) 75 mcg, Oral, Daily   levothyroxine (SYNTHROID) 75 mcg, Oral, Daily   levothyroxine (SYNTHROID) 75 mcg, Oral, Daily   Linzess 72 mcg, Oral, Daily before breakfast   LORazepam (ATIVAN) 0.5 MG tablet TAKE 2 TABLETS (1 MG)  BY MOUTH EVERY NIGHT AT BEDTIME AND  1 TABLET EVERY 4 HOURS AS NEEDED FOR ANXIETY   losartan (COZAAR) 100 mg, Oral, Daily   Lurasidone HCl 60 mg, Oral, Daily after supper   magic mouthwash w/lidocaine SOLN 5 mLs, Oral, 3 times daily PRN   memantine (NAMENDA) 10 mg, Oral, 2 times daily   methylphenidate (CONCERTA) 36 mg, Oral, Daily   metoprolol tartrate (LOPRESSOR) 25 mg, Oral, As needed   mirtazapine (REMERON) 7.5 mg, Oral, Daily at bedtime   Movantik 25 mg, Oral, Daily   Multiple Vitamins-Minerals (MULTIVITAMIN WITH MINERALS) tablet 1 tablet, Oral, Daily,     Multiple Vitamins-Minerals (PRESERVISION AREDS 2 PO) 1 capsule, Oral, 2 times daily   neomycin-polymyxin b-dexamethasone (MAXITROL) 3.5-10000-0.1 OINT Apply thin line (1 cm) of ointment to right eye twice daily for 1 week.   ofloxacin (OCUFLOX) 0.3 % ophthalmic solution Instill 1 drop into right eye four times daily for 7 days.   omeprazole-sodium bicarbonate (ZEGERID) 40-1100 MG capsule 1 capsule, Oral, Daily before breakfast   ondansetron (ZOFRAN-ODT) 4 MG disintegrating tablet Place 1 tablet every 6 hours by translingual route as needed.    pneumococcal 20-valent conjugate vaccine (PREVNAR 20) 0.5 ML injection Intramuscular   polyethylene glycol (MIRALAX / GLYCOLAX) 17 g, Oral, As needed   primidone (MYSOLINE) 50 MG tablet Take 2 tablets (100 mg total) by mouth every morning AND 1 tablet (50 mg total) at bedtime.   RSV vaccine recomb adjuvanted (AREXVY) 120 MCG/0.5ML injection Intramuscular   sennosides-docusate sodium (SENOKOT-S) 8.6-50 MG tablet 2 tablets, Oral, As needed   thiamine (VITAMIN B1) 100 mg, Oral, Daily   valACYclovir (VALTREX) 1000 MG tablet Take 2 tablets by mouth at onset, then 2 tablets 12 hours later.   Vitamin D3 2,000 Units, Oral, Every morning   vortioxetine HBr (TRINTELLIX) 20 mg, Oral, Daily     VITALS:   Vitals:   09/14/23 0937  BP: (!) 138/56  Pulse: 74  Resp: 20  SpO2: 99%  Height: 4\' 11"  (1.499 m)      PHYSICAL EXAM   HEENT:  Normocephalic, atraumatic.  The superficial temporal arteries are without ropiness or tenderness. Cardiovascular: Regular rate and rhythm. Lungs: Clear to auscultation bilaterally. Neck: There are no carotid bruits noted bilaterally.  NEUROLOGICAL:    09/14/2023   11:00 AM  Montreal Cognitive Assessment   Visuospatial/ Executive (0/5) 2  Naming (0/3) 1  Attention: Read list of digits (0/2) 1  Attention: Read list of letters (0/1) 0  Attention: Serial 7 subtraction starting at 100 (0/3) 1  Language: Repeat phrase (0/2) 1  Language : Fluency (0/1) 0  Abstraction (0/2) 1  Delayed Recall (0/5) 1  Orientation (0/6) 4  Total 12  Adjusted Score (based on education) 12        No data to display           Orientation:  Alert and oriented to person, place and not to time .Mild aphasia, no  dysarthria. Fund of knowledge is reduced. Recent and remote memory impaired.  Attention and concentration are reduced .  Able to name objects and to repeat phrases 1/2  . Delayed recall  1/5 Cranial nerves: There is good facial symmetry.  Flat affect/hypomania.   Extraocular muscles are intact and visual fields are full to confrontational testing. Speech is less fluent but clear. No tongue deviation. Hearing is intact to conversational tone.  Tone: Tone is good throughout.  No cogwheeling abnormal movements : v she has bilateral intention tremors, right greater than left. Sensation: Sensation  is intact to light touch.  Vibration is intact at the bilateral big toe.  Coordination: The patient has  difficulty with RAM's or FNF bilaterally.  Abnormal finger to nose RUE, normal LUE Motor: Strength is 5/5 in the bilateral upper and lower extremities. There is no pronator drift. There are no fasciculations noted. DTR's: Deep tendon reflexes are 2/4 bilaterally. Gait and Station: The patient is able to ambulate without difficulty. Uses a cane to ambulate. Gait is cautious and narrow. Stride length is short.     Thank you for allowing [name] the opportunity to participate in the care of this nice patient. Please do not hesitate to contact us for any questions or concerns.   Total time spent on today's visit was 90 minutes dedicated to this patient today, preparing to see patient, examining the patient, ordering tests and/or medications and counseling the patient, documenting clinical information in the EHR or other health record, independently interpreting results and communicating results to the patient/family, discussing treatment and goals, answering patient's questions and coordinating care.  Cc:  Margaree Mackintosh, MD  Marlowe Kays 09/14/2023 12:13 PM

## 2023-09-12 ENCOUNTER — Other Ambulatory Visit: Payer: Self-pay

## 2023-09-12 ENCOUNTER — Other Ambulatory Visit (HOSPITAL_BASED_OUTPATIENT_CLINIC_OR_DEPARTMENT_OTHER): Payer: Self-pay

## 2023-09-13 ENCOUNTER — Other Ambulatory Visit: Payer: Self-pay

## 2023-09-13 ENCOUNTER — Other Ambulatory Visit (HOSPITAL_BASED_OUTPATIENT_CLINIC_OR_DEPARTMENT_OTHER): Payer: Self-pay

## 2023-09-14 ENCOUNTER — Ambulatory Visit: Payer: Medicare Other

## 2023-09-14 ENCOUNTER — Encounter: Payer: Self-pay | Admitting: Physician Assistant

## 2023-09-14 ENCOUNTER — Ambulatory Visit (INDEPENDENT_AMBULATORY_CARE_PROVIDER_SITE_OTHER): Payer: Medicare Other | Admitting: Physician Assistant

## 2023-09-14 VITALS — BP 138/56 | HR 74 | Resp 20 | Ht 59.0 in

## 2023-09-14 DIAGNOSIS — R404 Transient alteration of awareness: Secondary | ICD-10-CM | POA: Diagnosis not present

## 2023-09-14 DIAGNOSIS — R413 Other amnesia: Secondary | ICD-10-CM | POA: Diagnosis not present

## 2023-09-14 NOTE — Patient Instructions (Addendum)
It was a pleasure to see you today at our office.   Recommendations:  MRI of the brain, the radiology office will call you to arrange you appointment   EEG Follow up Jan 10 at 10 am      For psychiatric meds, mood meds: Please have your primary care physician manage these medications.  If you have any severe symptoms of a stroke, or other severe issues such as confusion,severe chills or fever, etc call 911 or go to the ER as you may need to be evaluated further   For guidance regarding [facility] and if placement were needed at the facility, contact Social Worker tel: [phone]  For assessment of decision of mental capacity and competency:  Call Dr. Erick Blinks, geriatric psychiatrist at [phone]  Counseling regarding caregiver distress, including caregiver depression, anxiety and issues regarding community resources, adult day care programs, adult living facilities, or memory care questions:  please contact your  Primary Doctor's Social Worker   Whom to call: Memory  decline, memory medications: Call our office [phone]    [email or web address]   RECOMMENDATIONS FOR ALL PATIENTS WITH MEMORY PROBLEMS: 1. Continue to exercise (Recommend 30 minutes of walking everyday, or 3 hours every week) 2. Increase social interactions - continue going to [place] and enjoy social gatherings with friends and family 3. Eat healthy, avoid fried foods and eat more fruits and vegetables 4. Maintain adequate blood pressure, blood sugar, and blood cholesterol level. Reducing the risk of stroke and cardiovascular disease also helps promoting better memory. 5. Avoid stressful situations. Live a simple life and avoid aggravations. Organize your time and prepare for the next day in anticipation. 6. Sleep well, avoid any interruptions of sleep and avoid any distractions in the bedroom that may interfere with adequate sleep  quality 7. Avoid sugar, avoid sweets as there is a strong link between excessive sugar intake, diabetes, and cognitive impairment We discussed the Mediterranean diet, which has been shown to help patients reduce the risk of progressive memory disorders and reduces cardiovascular risk. This includes eating fish, eat fruits and green leafy vegetables, nuts like almonds and hazelnuts, walnuts, and also use olive oil. Avoid fast foods and fried foods as much as possible. Avoid sweets and sugar as sugar use has been linked to worsening of memory function.  There is always a concern of gradual progression of memory problems. If this is the case, then we may need to adjust level of care according to patient needs. Support, both to the patient and caregiver, should then be put into place.      You have been referred for a neuropsychological evaluation (i.e., evaluation of memory and thinking abilities). Please bring someone with you to this appointment if possible, as it is helpful for the doctor to hear from both you and another adult who knows you well. Please bring eyeglasses and hearing aids if you wear them.    The evaluation will take approximately 3 hours and has two parts:   The first part is a clinical interview with the neuropsychologist (Dr. Milbert Coulter or Dr. Roseanne Reno). During the interview, the neuropsychologist will speak with you and the individual you brought to the appointment.    The second part of the evaluation is testing with the doctor's technician Annabelle Harman or [name]). During the testing, the technician will ask you to remember different types of material, solve problems, and answer some questionnaires. Your family member will not be present for this portion of the evaluation.  Please note: We must reserve several hours of the neuropsychologist's time and the psychometrician's time for your evaluation appointment. As such, there is a No-Show fee of $100. If you are unable to attend any of your  appointments, please contact our office as soon as possible to reschedule.      DRIVING: Regarding driving, in patients with progressive memory problems, driving will be impaired. We advise to have someone else do the driving if trouble finding directions or if minor accidents are reported. Independent driving assessment is available to determine safety of driving.   If you are interested in the driving assessment, you can contact the following:  [facility] in [place] [phone]  [facility] [phone]  [facility] [phone]  [facility] [phone] or [phone]   FALL PRECAUTIONS: Be cautious when walking. Scan the area for obstacles that may increase the risk of trips and falls. When getting up in the mornings, sit up at the edge of the bed for a few minutes before getting out of bed. Consider elevating the bed at the head end to avoid drop of blood pressure when getting up. Walk always in a well-lit room (use night lights in the walls). Avoid area rugs or power cords from appliances in the middle of the walkways. Use a walker or a cane if necessary and consider physical therapy for balance exercise. Get your eyesight checked regularly.  FINANCIAL OVERSIGHT: Supervision, especially oversight when making financial decisions or transactions is also recommended.  HOME SAFETY: Consider the safety of the kitchen when operating appliances like stoves, microwave oven, and blender. Consider having supervision and share cooking responsibilities until no longer able to participate in those. Accidents with firearms and other hazards in the house should be identified and addressed as well.   ABILITY TO BE LEFT ALONE: If patient is unable to contact 911 operator, consider using LifeLine, or when the need is there, arrange for someone to stay with patients. Smoking is a fire hazard, consider supervision or cessation. Risk of wandering should  be assessed by caregiver and if detected at any point, supervision and safe proof recommendations should be instituted.  MEDICATION SUPERVISION: Inability to self-administer medication needs to be constantly addressed. Implement a mechanism to ensure safe administration of the medications.      Mediterranean Diet A Mediterranean diet refers to food and lifestyle choices that are based on the traditions of countries located on the [employer] [place]. This way of eating has been shown to help prevent certain conditions and improve outcomes for people who have chronic diseases, like kidney disease and heart disease. What are tips for following this plan? Lifestyle  Cook and eat meals together with your family, when possible. Drink enough fluid to keep your urine clear or pale yellow. Be physically active every day. This includes: Aerobic exercise like running or swimming. Leisure activities like gardening, walking, or housework. Get 7-8 hours of sleep each night. If recommended by your health care provider, drink red wine in moderation. This means 1 glass a day for nonpregnant women and 2 glasses a day for men. A glass of wine equals 5 oz (150 mL). Reading food labels  Check the serving size of packaged foods. For foods such as rice and pasta, the serving size refers to the amount of cooked product, not dry. Check the total fat in packaged foods. Avoid foods that have saturated fat or trans fats. Check the ingredients list for added sugars, such as corn syrup. Shopping  At the grocery store, buy most of your food from the areas near the walls of the store. This includes: Fresh fruits and vegetables (produce). Grains, beans, nuts, and seeds. Some of these may be available in unpackaged forms or large amounts (in bulk). Fresh seafood. Poultry and eggs. Low-fat dairy products. Buy whole ingredients instead of prepackaged foods. Buy fresh fruits and vegetables in-season from local farmers  markets. Buy frozen fruits and vegetables in resealable bags. If you do not have access to quality fresh seafood, buy precooked frozen shrimp or canned fish, such as tuna, salmon, or sardines. Buy small amounts of raw or cooked vegetables, salads, or olives from the deli or salad bar at your store. Stock your pantry so you always have certain foods on hand, such as olive oil, canned tuna, canned tomatoes, rice, pasta, and beans. Cooking  Cook foods with extra-virgin olive oil instead of using butter or other vegetable oils. Have meat as a side dish, and have vegetables or grains as your main dish. This means having meat in small portions or adding small amounts of meat to foods like pasta or stew. Use beans or vegetables instead of meat in common dishes like chili or lasagna. Experiment with different cooking methods. Try roasting or broiling vegetables instead of steaming or sauteing them. Add frozen vegetables to soups, stews, pasta, or rice. Add nuts or seeds for added healthy fat at each meal. You can add these to yogurt, salads, or vegetable dishes. Marinate fish or vegetables using olive oil, lemon juice, garlic, and fresh herbs. Meal planning  Plan to eat 1 vegetarian meal one day each week. Try to work up to 2 vegetarian meals, if possible. Eat seafood 2 or more times a week. Have healthy snacks readily available, such as: Vegetable sticks with hummus. Greek yogurt. Fruit and nut trail mix. Eat balanced meals throughout the week. This includes: Fruit: 2-3 servings a day Vegetables: 4-5 servings a day Low-fat dairy: 2 servings a day Fish, poultry, or lean meat: 1 serving a day Beans and legumes: 2 or more servings a week Nuts and seeds: 1-2 servings a day Whole grains: 6-8 servings a day Extra-virgin olive oil: 3-4 servings a day Limit red meat and sweets to only a few servings a month What are my food choices? Mediterranean diet Recommended Grains: Whole-grain pasta. Brown  rice. Bulgar wheat. Polenta. Couscous. Whole-wheat bread. Orpah Cobb. Vegetables: Artichokes. Beets. Broccoli. Cabbage. Carrots. Eggplant. Green beans. Chard. Kale. Spinach. Onions. Leeks. Peas. Squash. Tomatoes. Peppers. Radishes. Fruits: Apples. Apricots. Avocado. Berries. Bananas. Cherries. Dates. Figs. Grapes. Lemons. Melon. Oranges. Peaches. Plums. Pomegranate. Meats and other protein foods: Beans. Almonds. Sunflower seeds. Pine nuts. Peanuts. Cod. Salmon. Scallops. Shrimp. Tuna. Tilapia. Clams. Oysters. Eggs. Dairy: Low-fat milk. Cheese. Greek yogurt. Beverages: Water. Red wine. Herbal tea. Fats and oils: Extra virgin olive oil. Avocado oil. Grape seed oil. Sweets and desserts: [place] yogurt with honey. Baked apples. Poached pears. Trail mix. Seasoning and other foods: Basil. Cilantro. Coriander. Cumin. Mint. Parsley. Sage. Rosemary. Tarragon. Garlic. Oregano. Thyme. Pepper. Balsalmic vinegar. Tahini. Hummus. Tomato sauce. Olives. Mushrooms. Limit these Grains: Prepackaged pasta or rice dishes. Prepackaged cereal with added sugar. Vegetables: Deep fried potatoes (french fries). Fruits: Fruit canned in syrup. Meats and other protein foods: Beef. Pork. Lamb. Poultry with skin. Hot dogs. Tomasa Blase. Dairy: Ice cream. Sour cream. Whole milk. Beverages: Juice. Sugar-sweetened soft drinks. Beer. Liquor and spirits. Fats and oils: Butter. Canola oil. Vegetable oil. Beef fat (tallow). Lard. Sweets and desserts: Cookies.  Cakes. Pies. Candy. Seasoning and other foods: Mayonnaise. Premade sauces and marinades. The items listed may not be a complete list. Talk with your dietitian about what dietary choices are right for you. Summary The Mediterranean diet includes both food and lifestyle choices. Eat a variety of fresh fruits and vegetables, beans, nuts, seeds, and whole grains. Limit the amount of red meat and sweets that you eat. Talk with your health care provider about whether it is safe for you  to drink red wine in moderation. This means 1 glass a day for nonpregnant women and 2 glasses a day for men. A glass of wine equals 5 oz (150 mL). This information is not intended to replace advice given to you by your health care provider. Make sure you discuss any questions you have with your health care provider. Document Released: 05/13/2016 Document Revised: 06/15/2016 Document Reviewed: 05/13/2016 Elsevier Interactive Patient Education  2017 [employer].

## 2023-09-19 ENCOUNTER — Telehealth: Payer: Self-pay | Admitting: Psychiatry

## 2023-09-19 DIAGNOSIS — R413 Other amnesia: Secondary | ICD-10-CM

## 2023-09-19 NOTE — Telephone Encounter (Signed)
[name] called and said that Meghan Oliver has covid and cancelled on Friday  12/20. [name] wants to know what Friday she can be seen

## 2023-09-21 ENCOUNTER — Other Ambulatory Visit: Payer: Medicare Other

## 2023-09-23 ENCOUNTER — Other Ambulatory Visit (HOSPITAL_BASED_OUTPATIENT_CLINIC_OR_DEPARTMENT_OTHER): Payer: Self-pay

## 2023-09-23 ENCOUNTER — Ambulatory Visit: Payer: Medicare Other | Admitting: Psychiatry

## 2023-09-24 ENCOUNTER — Other Ambulatory Visit (HOSPITAL_BASED_OUTPATIENT_CLINIC_OR_DEPARTMENT_OTHER): Payer: Self-pay

## 2023-09-24 ENCOUNTER — Other Ambulatory Visit: Payer: Self-pay | Admitting: Internal Medicine

## 2023-09-26 ENCOUNTER — Other Ambulatory Visit (HOSPITAL_BASED_OUTPATIENT_CLINIC_OR_DEPARTMENT_OTHER): Payer: Self-pay

## 2023-09-26 ENCOUNTER — Other Ambulatory Visit: Payer: Self-pay

## 2023-09-26 MED ORDER — VALACYCLOVIR HCL 1 G PO TABS
2000.0000 mg | ORAL_TABLET | ORAL | 1 refills | Status: DC
  Filled 2023-09-26: qty 30, 8d supply, fill #0
  Filled 2023-12-28: qty 30, 8d supply, fill #1

## 2023-09-26 MED FILL — Levothyroxine Sodium Tab 75 MCG: ORAL | 90 days supply | Qty: 90 | Fill #0 | Status: AC

## 2023-09-27 ENCOUNTER — Other Ambulatory Visit: Payer: Self-pay

## 2023-09-27 ENCOUNTER — Other Ambulatory Visit (HOSPITAL_BASED_OUTPATIENT_CLINIC_OR_DEPARTMENT_OTHER): Payer: Self-pay

## 2023-09-29 ENCOUNTER — Other Ambulatory Visit (HOSPITAL_BASED_OUTPATIENT_CLINIC_OR_DEPARTMENT_OTHER): Payer: Self-pay

## 2023-10-03 DIAGNOSIS — F332 Major depressive disorder, recurrent severe without psychotic features: Secondary | ICD-10-CM | POA: Diagnosis not present

## 2023-10-03 DIAGNOSIS — K219 Gastro-esophageal reflux disease without esophagitis: Secondary | ICD-10-CM | POA: Diagnosis not present

## 2023-10-03 DIAGNOSIS — E039 Hypothyroidism, unspecified: Secondary | ICD-10-CM | POA: Diagnosis not present

## 2023-10-03 DIAGNOSIS — I5032 Chronic diastolic (congestive) heart failure: Secondary | ICD-10-CM | POA: Diagnosis not present

## 2023-10-03 DIAGNOSIS — G4730 Sleep apnea, unspecified: Secondary | ICD-10-CM | POA: Diagnosis not present

## 2023-10-03 DIAGNOSIS — I2510 Atherosclerotic heart disease of native coronary artery without angina pectoris: Secondary | ICD-10-CM | POA: Diagnosis not present

## 2023-10-03 DIAGNOSIS — J449 Chronic obstructive pulmonary disease, unspecified: Secondary | ICD-10-CM | POA: Diagnosis not present

## 2023-10-03 DIAGNOSIS — F419 Anxiety disorder, unspecified: Secondary | ICD-10-CM | POA: Diagnosis not present

## 2023-10-06 ENCOUNTER — Ambulatory Visit (INDEPENDENT_AMBULATORY_CARE_PROVIDER_SITE_OTHER): Payer: Medicare Other | Admitting: Neurology

## 2023-10-06 DIAGNOSIS — R413 Other amnesia: Secondary | ICD-10-CM | POA: Diagnosis not present

## 2023-10-06 DIAGNOSIS — R404 Transient alteration of awareness: Secondary | ICD-10-CM | POA: Diagnosis not present

## 2023-10-07 ENCOUNTER — Other Ambulatory Visit (HOSPITAL_BASED_OUTPATIENT_CLINIC_OR_DEPARTMENT_OTHER): Payer: Self-pay

## 2023-10-10 DIAGNOSIS — G4730 Sleep apnea, unspecified: Secondary | ICD-10-CM | POA: Diagnosis not present

## 2023-10-10 DIAGNOSIS — N189 Chronic kidney disease, unspecified: Secondary | ICD-10-CM | POA: Diagnosis not present

## 2023-10-10 DIAGNOSIS — K219 Gastro-esophageal reflux disease without esophagitis: Secondary | ICD-10-CM | POA: Diagnosis not present

## 2023-10-10 DIAGNOSIS — J449 Chronic obstructive pulmonary disease, unspecified: Secondary | ICD-10-CM | POA: Diagnosis not present

## 2023-10-10 DIAGNOSIS — F332 Major depressive disorder, recurrent severe without psychotic features: Secondary | ICD-10-CM | POA: Diagnosis not present

## 2023-10-10 DIAGNOSIS — I2510 Atherosclerotic heart disease of native coronary artery without angina pectoris: Secondary | ICD-10-CM | POA: Diagnosis not present

## 2023-10-10 DIAGNOSIS — F419 Anxiety disorder, unspecified: Secondary | ICD-10-CM | POA: Diagnosis not present

## 2023-10-10 DIAGNOSIS — E039 Hypothyroidism, unspecified: Secondary | ICD-10-CM | POA: Diagnosis not present

## 2023-10-11 ENCOUNTER — Ambulatory Visit (HOSPITAL_COMMUNITY): Admission: RE | Admit: 2023-10-11 | Payer: Medicare Other | Source: Ambulatory Visit

## 2023-10-12 ENCOUNTER — Other Ambulatory Visit (HOSPITAL_BASED_OUTPATIENT_CLINIC_OR_DEPARTMENT_OTHER): Payer: Self-pay

## 2023-10-14 ENCOUNTER — Ambulatory Visit: Payer: Medicare Other | Admitting: Psychiatry

## 2023-10-14 ENCOUNTER — Ambulatory Visit (INDEPENDENT_AMBULATORY_CARE_PROVIDER_SITE_OTHER): Payer: Medicare Other | Admitting: Physician Assistant

## 2023-10-14 ENCOUNTER — Encounter: Payer: Self-pay | Admitting: Psychiatry

## 2023-10-14 ENCOUNTER — Encounter: Payer: Self-pay | Admitting: Physician Assistant

## 2023-10-14 VITALS — BP 110/60 | Resp 20 | Ht 59.0 in

## 2023-10-14 DIAGNOSIS — G250 Essential tremor: Secondary | ICD-10-CM | POA: Diagnosis not present

## 2023-10-14 DIAGNOSIS — G251 Drug-induced tremor: Secondary | ICD-10-CM

## 2023-10-14 DIAGNOSIS — F061 Catatonic disorder due to known physiological condition: Secondary | ICD-10-CM | POA: Diagnosis not present

## 2023-10-14 DIAGNOSIS — G40909 Epilepsy, unspecified, not intractable, without status epilepticus: Secondary | ICD-10-CM

## 2023-10-14 DIAGNOSIS — F411 Generalized anxiety disorder: Secondary | ICD-10-CM | POA: Diagnosis not present

## 2023-10-14 DIAGNOSIS — R519 Headache, unspecified: Secondary | ICD-10-CM | POA: Diagnosis not present

## 2023-10-14 DIAGNOSIS — F5105 Insomnia due to other mental disorder: Secondary | ICD-10-CM | POA: Diagnosis not present

## 2023-10-14 DIAGNOSIS — G8929 Other chronic pain: Secondary | ICD-10-CM | POA: Diagnosis not present

## 2023-10-14 DIAGNOSIS — R413 Other amnesia: Secondary | ICD-10-CM

## 2023-10-14 DIAGNOSIS — F314 Bipolar disorder, current episode depressed, severe, without psychotic features: Secondary | ICD-10-CM | POA: Diagnosis not present

## 2023-10-14 NOTE — Progress Notes (Signed)
 Assessment/Plan:      Dementia of unclear etiology  Meghan Oliver is a very pleasant 83 y.o. RH female hypertension, hyperlipidemia, bipolar disorder, depression, insomnia, hypothyroidism, sinus node dysfunction/bradycardia s/p PMP 20219 ([facility]), chronic systolic heart failure, OSA not on CPAP, GERD, and a diagnosis of moderate dementia of unspecified etiology as per Neuropsych evaluation Jan 2019, initially seen 09/14/2023 due to worsening memory, with a MoCA of 12/30, (very effortful test).  Etiology is currently unclear although there is family history of Parkinson's dementia and Lewy body disease.  She has a history of tremors treated with primidone .  MRI of the brain is scheduled for February 2025.  In the interim, workup had included an EEG Which was remarkable for spike activity requiring further evaluation.  In view of her history of ECT (greater than 230 sessions), and dementia, we discussed ordering a 24-hour EEG to further investigate seizure activity.  Based on the results, antiseizure medication will be initiated.  Patient and husband are interested in seeing our epileptologist in opinion, will schedule depending on the results of this 24-hour EEG.  Last ECT was on 10/03/2023   Tremors, lithium  induced versus Parkinson's Family history positive for Parkinson's dementia and Lewy body disease She does have a history of tremors treated with primidone  On exam, stride is short of mild shuffling is noted.  She flexes forward and hypomimia is seen. She has been on lithium  for an extended period of time. MRI of the brain is scheduled for February 2025, will follow the results May consider DaTscan. Continue primidone  50 mg in the morning and 100 mg at night   This patient is accompanied in the office by  who supplements the history.  Previous records as well as any outside records available were reviewed prior to todays visit.   Initial visit 09/14/2023    The patient is  accompanied by her nurse [name] and her husband who supplements the history.   Substantial memory issues over the last year  How long did patient have memory difficulties?  Nurse reports that they have been substantial memory issues over the last year, worse over the last 2 months, following a catatonic state due to memory issues (not to behavioral history).  She has greater than 230 sessions of ECT, currently at Q 2 week period.  Within the last 6 weeks, she has more difficulty remembering new information, recent conversations, names.  She needs more time to think about her answers especially over the last 6 to 9 months no more than 200 words a day -nurse says.  Sometimes it is disorientation with time, does not know morning for night. repeats oneself?  Endorsed why are we going to x, y or z, or she repeats a request till we get past that   Disoriented when walking into a room?  Sometimes you get disoriented where the rooms are, for example her bedroom.   Leaving objects in unusual places? Denies.   Wandering behavior? Denies. She has 24/7 care but could easily get lost if left alone Any personality changes, or depression, anxiety? She has a history of severe BP disorder and MDD followed by psychiatry  (Dr. Geoffry).  She has been more withdrawn, not participating in activities that she used to.  She used to be very sociable before. Hallucinations or paranoia? denies   Seizures? denies.  Frank seizures, although she does get disoriented at certain times if it is day or night  Any sleep changes?  If she does  not have stimulation, she will sleep a lot.  Denies vivid dreams, REM behavior or sleepwalking  . Increased disorientation and night and confusion in the morning-nurse says. Sleep apnea? Endorsed, not on CPAP Any hygiene concerns?  Needs to be reminded to shower, needs assistance with decision making to get dress, not an exact black or white issue.  Independent of bathing and dressing? Endorsed   Does the patient need help with medications?  Nurse is in charge   Who is in charge of the finances?  Husband is in charge     Any changes in appetite? Currently is good, it was bad during the severe depression .      Patient have trouble swallowing?  Denies.   Does the patient cook? No  Any headaches? She has intermittent temporal headache history. .   Chronic pain? Denies.  Takes tylenol  for everything, needs monitoring.  Ambulates with difficulty?  She has some gait instability, does PT. Needs a walker or Rolator as well, especially at night at bedside   Recent falls or head injuries? Denies.     Vision changes?  She has a history of macular degeneration, takes Avastin injections  Any strokelike symptoms? Denies.   Any tremors ?Endorsed, worse with anxiety, on primidone .  She denies having trouble buttoning.  She is able to eat by herself.  Denies choking with food. Any anosmia? Denies.   Any incontinence of urine? OAB, no real incontinence. No longer on meds for that she asks to go forgetting that she just went .  She has a history of recurrent UTI that goes in phases  Any bowel dysfunction? Chronic constipation on Linzess   Patient lives at [facility] [place]     History of heavy alcohol intake1/2 glass wine once monthly. 10 y ago she consumed a lot more History of heavy tobacco use? No longer smokes  Family history of dementia?  Brother had Parkinson's dementia and had a rather with LBD. Does patient drive? Not for 15 yrs    College degree     History of present illness (seizure) Tremor?:  ET on primidone  due to lithium  Voice: Speaks very softly, whispers.  Hallucinations:    visual distortions: Denies             Auditory hallucinations?Denies   Taste of blood or metallic taste? Denies, but when she is depressed she is hypersensitive to salt and overdrinks water .  Nausea and Vomiting? Sometimes she has nausea, comes in waves, last episode 4-5 months ago. She has severe  reflux which would likely be the culprit.  Lightheadedness/ Dizziness? Difficult because she does not communicate it  Syncope? No   Diplopia or other visual changes? NO  History of encephalitis or meningitis? NO History of Headaches? Head Trauma/Injuries? NO Loss of smell:  NO  Loss of taste :  NO Urinary or Bowel Incontinence : 10 years ago she was standing in the kitchen spontaneous bladder incontinence, it may have happened 2 times, unclear if she was on Rx.  Difficulty Swallowing ? Dysphagia with pills  Trouble with ADL's?  Trouble buttoning clothing? Yes, issues with fine motor movements.  Mood Changes? Depression Memory changes:  Sleep:  How many hours? Takes clonazepam  as sleep aid.               Rested upon waking up? Very flat-her husband says ETOH? No Tobacco? No  Recreational Drugs? NO   Caffeine ? NO  Postural symptoms:    Falls?  NO   Headaches  Onset:  Wakes up in the morning and then come intermittently . She gets HA on the way to [facility] (anxiety provokes it)  Quality: sharp  Intensity: Difficult to assess, subj 9-0 , but prob 4-5/10  Location: frontal , stays in there Duration: Hours or multiple days in a row Frequency: intermittent  Associated symptoms: she has always been hypersensitive to light, but no other symptoms Aura: no  Activity: worse with activity, so she closes her eyes, can last hrs or minutes Aggravating factors: no , except anxiety or stress, tiredness    Current abortive medications: aleve prn 2 times a week, tylenol   6  a day Current prophylactic medications:   Past abortive medications:  never  Past prophylactic medications: never     Out-side paper records, electronic medical record, and images have been reviewed where available and summarized as:  Lab Results  Component Value Date   HGBA1C 5.1 02/15/2011   Lab Results  Component Value Date   VITAMINB12 >2,000 (H) 10/11/2022   Lab Results  Component Value Date   TSH  1.58 07/20/2022   Lab Results  Component Value Date   ESRSEDRATE 2 06/27/2023      Past Medical History:  Diagnosis Date   Anxiety    Arthritis    hips, spine (03/17/2018)   Bipolar II disorder (HCC)    CHF (congestive heart failure) (HCC)    Chronic bronchitis (HCC)    Chronic lower back pain    Chronic right hip pain    CKD (chronic kidney disease), stage II    Coronary artery disease    stent x1   Esophagitis, erosive    GAD (generalized anxiety disorder)    GERD (gastroesophageal reflux disease)    Headache    maybe monthly (03/17/2018))   Heart murmur, systolic    History of adenomatous polyp of colon    08-04-2016  tubular adenoma   History of blood transfusion 12/2017   related to vascular hematoma   History of electroconvulsive therapy    at [facility]--  started 04-15-2015 to 11-17-2016  total greater than 40 times   History of hiatal hernia    Hyperlipidemia    Hypertension    Hypothyroidism    Internal carotid artery stenosis, bilateral    per last duplex 05-01-2014  bilateral ICA 40-59%   Major depression, chronic    ECT treatments extensive and multiple started 07/ 2016   Memory loss    both short and long-term; needs frequent reminders to follow instrucitons (05/16/2017)   Migraines    none in years (03/17/2018)   OSA (obstructive sleep apnea)    per study 06/ 2012 moderate OSA  ; refuses to wear masks (03/17/2018)   Osteoporosis    Pneumonia 07/29/2022   Poor historian    due to short term memory loss   Presence of permanent cardiac pacemaker 03/17/2018   Pulmonary nodule    monitored by pcp   S/P placement of cardiac pacemaker 03/17/18 [facility]  03/18/2018   Short-term memory loss    Sick sinus syndrome [facility])     Past Surgical History:  Procedure Laterality Date   APPENDECTOMY  1971   ARTERY BIOPSY Left 07/05/2022   Procedure: BIOPSY TEMPORAL ARTERY;  Surgeon: Lanis Fonda BRAVO, MD;  Location: [facility] OR;  Service: Vascular;  Laterality: Left;    AUGMENTATION MAMMAPLASTY Bilateral    CARDIOVASCULAR STRESS TEST  01-30-2015  dr rolan   Low risk nuclear study w/ no evidence ischemia or  infarction/  normal LV funciton and wall motion , 76%   COLONOSCOPY W/ BIOPSIES AND POLYPECTOMY  multiple   COLONOSCOPY WITH ESOPHAGOGASTRODUODENOSCOPY (EGD)  last one 08-04-2016   CORONARY ANGIOPLASTY WITH STENT PLACEMENT  05/16/2017   LAD   CORONARY STENT INTERVENTION N/A 05/16/2017   Procedure: CORONARY STENT INTERVENTION;  Surgeon: Verlin Lonni BIRCH, MD;  Location: [facility] INVASIVE CV LAB;  Service: Cardiovascular;  Laterality: N/A;   ESOPHAGOGASTRODUODENOSCOPY  02-26-04   ESOPHAGOGASTRODUODENOSCOPY  multiple   INSERT / REPLACE / REMOVE PACEMAKER  03/17/2018   IR THORACENTESIS ASP PLEURAL SPACE W/IMG GUIDE  07/07/2022   LEFT HEART CATH AND CORONARY ANGIOGRAPHY N/A 05/16/2017   Procedure: LEFT HEART CATH AND CORONARY ANGIOGRAPHY;  Surgeon: Rolan Ezra RAMAN, MD;  Location: [facility] INVASIVE CV LAB;  Service: Cardiovascular;  Laterality: N/A;   OVARIAN CYST SURGERY  1970s   Laparotomy    PACEMAKER IMPLANT N/A 03/17/2018   Procedure: PACEMAKER IMPLANT;  Surgeon: Waddell Danelle ORN, MD;  Location: [facility] INVASIVE CV LAB;  Service: Cardiovascular;  Laterality: N/A;   PORT-A-CATH PLACEMENT  05/31/2016; 2018   @ [facility]; for ECT series; @ [facility] also   PORT-A-CATH REMOVAL N/A 03/11/2017   Procedure: REMOVAL PORT-A-CATH;  Surgeon: Lily Boas, MD;  Location[facility];  Service: General;  Laterality: N/A;   PORTA CATH REMOVAL  03/17/2018   PORTA CATH REMOVAL  03/17/2018   Procedure: PORTA CATH REMOVAL;  Surgeon: Waddell Danelle ORN, MD;  Location: [facility] INVASIVE CV LAB;  Service: Cardiovascular;;   TOTAL HIP ARTHROPLASTY Right 05/18/2022   Procedure: TOTAL HIP ARTHROPLASTY ANTERIOR APPROACH;  Surgeon: Ernie Cough, MD;  Location: [facility] ORS;  Service: Orthopedics;  Laterality: Right;   TRANSTHORACIC ECHOCARDIOGRAM  09/05/2013  dr rolan   mild LVH, ef  65-70%, grade 1 diastolic dysfunction/  very mild AV stenosis with mild AR/  trivial MR and PT/ mild to moderate LAE/ mild TR/ mild pulmonary hypertension with PA peak pressure   TUBAL LIGATION       Medications:  Outpatient Encounter Medications as of 10/14/2023  Medication Sig Note   amLODipine  (NORVASC ) 10 MG tablet Take 1 tablet (10 mg total) by mouth daily.    aspirin  81 MG chewable tablet Chew 1 tablet (81 mg total) by mouth daily.    Calcium  Carbonate-Vitamin D  (CALCIUM  600/VITAMIN D  PO) Take 1 tablet by mouth 2 (two) times daily.    cetirizine (ZYRTEC) 10 MG tablet Take 10 mg by mouth as needed for allergies. 07/01/2022: prn   Cholecalciferol  (VITAMIN D3) 50 MCG (2000 UT) TABS Take 2,000 Units by mouth in the morning.    clonazePAM  (KLONOPIN ) 0.5 MG tablet Take 1 tablet (0.5 mg total) by mouth 2 (two) times daily.    cloNIDine  (CATAPRES ) 0.1 MG tablet Take 1 tablet (0.1 mg total) by mouth as needed prior to ECT treatment    COVID-19 mRNA vaccine 2023-2024 (COMIRNATY ) syringe Inject into the muscle. (Patient not taking: Reported on 09/14/2023)    dexlansoprazole  (DEXILANT ) 60 MG capsule Take 1 capsule by mouth daily    glycerin  adult 2 g suppository Use 1 suppository as needed if you have not had a bowel movement in  4 to 5 days    hydrochlorothiazide  (HYDRODIURIL ) 12.5 MG tablet Take 1 tablet (12.5 mg total) by mouth daily.    influenza vaccine adjuvanted (FLUAD  QUADRIVALENT) 0.5 ML injection Inject into the muscle.    Krill Oil 500 MG CAPS Take 500 mg by mouth daily. (Patient not taking: Reported  on 09/14/2023)    lactulose  (CHRONULAC ) 10 GM/15ML solution Take 30 mLs (20 g total) by mouth 3 (three) times daily between meals as needed for mild constipation. (Patient taking differently: Take 20 g by mouth daily as needed for mild constipation.)    Lavender Oil 80 MG CAPS Take 160 mg by mouth at bedtime.    levothyroxine  (SYNTHROID ) 75 MCG tablet Take 1 tablet (75 mcg total) by  mouth daily.    levothyroxine  (SYNTHROID ) 75 MCG tablet Take 1 tablet (75 mcg total) by mouth daily.    levothyroxine  (SYNTHROID ) 75 MCG tablet Take 1 tablet (75 mcg total) by mouth daily.    linaclotide  (LINZESS ) 72 MCG capsule Take 1 capsule (72 mcg total) by mouth daily before breakfast. (Patient taking differently: Take 72 mcg by mouth in the morning and at bedtime.)    LORazepam  (ATIVAN ) 0.5 MG tablet TAKE 2 TABLETS (1 MG)  BY MOUTH EVERY NIGHT AT BEDTIME AND 1 TABLET EVERY 4 HOURS AS NEEDED FOR ANXIETY    losartan  (COZAAR ) 100 MG tablet Take 1 tablet (100 mg total) by mouth daily.    Lurasidone  HCl 60 MG TABS Take 1 tablet (60 mg total) by mouth daily after supper.    magic mouthwash w/lidocaine  SOLN Take 5 mLs by mouth 3 (three) times daily as needed for mouth pain.    memantine  (NAMENDA ) 10 MG tablet Take 1 tablet (10 mg total) by mouth 2 (two) times daily.    methylphenidate  36 MG PO CR tablet Take 1 tablet (36 mg total) by mouth daily. (Patient taking differently: Take 27 mg by mouth daily.)    metoprolol  tartrate (LOPRESSOR ) 25 MG tablet Take 1 tablet (25 mg total) by mouth as needed.    mirtazapine  (REMERON ) 7.5 MG tablet Take 1 tablet (7.5 mg total) by mouth at bedtime.    Multiple Vitamins-Minerals (MULTIVITAMIN WITH MINERALS) tablet Take 1 tablet by mouth daily.    Multiple Vitamins-Minerals (PRESERVISION AREDS 2 PO) Take 1 capsule by mouth in the morning and at bedtime.    naloxegol  oxalate (MOVANTIK ) 12.5 MG TABS tablet Take 2 tablets (25 mg total) by mouth daily. (Patient taking differently: Take 25 mg by mouth daily. PRN)    neomycin -polymyxin b-dexamethasone  (MAXITROL ) 3.5-10000-0.1 OINT Apply thin line (1 cm) of ointment to right eye twice daily for 1 week.    ofloxacin  (OCUFLOX ) 0.3 % ophthalmic solution Instill 1 drop into right eye four times daily for 7 days.    omeprazole -sodium bicarbonate  (ZEGERID ) 40-1100 MG capsule Take 1 capsule by mouth daily before breakfast.  (Patient not taking: Reported on 09/14/2023)    ondansetron  (ZOFRAN -ODT) 4 MG disintegrating tablet Place 1 tablet every 6 hours by translingual route as needed. (Patient not taking: Reported on 09/14/2023) 07/01/2022: prn   pneumococcal 20-valent conjugate vaccine (PREVNAR 20 ) 0.5 ML injection Inject into the muscle.    polyethylene glycol (MIRALAX  / GLYCOLAX ) 17 g packet Take 17 g by mouth as needed for mild constipation. 07/01/2022: prn   primidone  (MYSOLINE ) 50 MG tablet Take 2 tablets (100 mg total) by mouth every morning AND 1 tablet (50 mg total) at bedtime.    RSV vaccine recomb adjuvanted (AREXVY ) 120 MCG/0.5ML injection Inject into the muscle.    sennosides-docusate sodium  (SENOKOT-S) 8.6-50 MG tablet Take 2 tablets by mouth as needed.    thiamine  (VITAMIN B-1) 100 MG tablet Take 1 tablet (100 mg total) by mouth daily.    valACYclovir  (VALTREX ) 1000 MG tablet Take 2 tablets (2,000 mg total)  by mouth at onset, then take 2 tablets 12 hours later.    vitamin B-12 (CYANOCOBALAMIN ) 1000 MCG tablet Take 1,000 mcg by mouth at bedtime. (Patient not taking: Reported on 09/14/2023)    vortioxetine  HBr (TRINTELLIX ) 20 MG TABS tablet Take 1 tablet (20 mg total) by mouth daily.    No facility-administered encounter medications on file as of 10/14/2023.    Allergies:  Allergies  Allergen Reactions   Propranolol Other (See Comments)    Low blood pressure. Bradycardia    Propranolol Hcl Other (See Comments)   Brexpiprazole Other (See Comments)    Aphasia and catatonia    Family History: Family History  Problem Relation Age of Onset   Heart attack Father 25       deceased   Hypertension Father    Heart disease Father    Dementia Brother    Parkinson's disease Brother    Heart disease Brother    Breast cancer Paternal Aunt        Age 28's   Breast cancer Paternal Grandmother        Age unknown   Colon cancer Neg Hx     Social History: Social History   Tobacco Use   Smoking status:  Former    Current packs/day: 0.00    Average packs/day: 2.0 packs/day for 15.0 years (30.0 ttl pk-yrs)    Types: Cigarettes    Start date: 01/13/1956    Quit date: 01/13/1971    Years since quitting: 52.7    Passive exposure: Never   Smokeless tobacco: Never  Vaping Use   Vaping status: Never Used  Substance Use Topics   Alcohol use: Yes    Comment: occassional 1 x a week   Drug use: Never   Social History   Social History Narrative   Right handed   Drinks caffeine  no   Single home at [employer]    Vital Signs:  Resp 20   Ht 4' 11 (1.499 m)   LMP  (LMP Unknown)   BMI 19.79 kg/m    General Medical Exam:    General:  Well appearing, comfortable.  Flat affect Eyes/ENT: see cranial nerve examination.   Neck:   No carotid bruits. Respiratory:  Clear to auscultation, good air entry bilaterally.   Cardiac:  Regular rate and rhythm, no murmur.   Extremities:  No deformities, edema, or skin discoloration.  Skin:  No rashes or lesions.  Neurological Exam: MENTAL STATUS including orientation to time, place, person, recent and remote memory, attention span and concentration, language, and fund of knowledge is reduced Speech is not dysarthric.  CRANIAL NERVES: II:  No visual field defects.  Fundi not visualized III-IV-VI: Pupils equal round and reactive to light.  Normal conjugate, extra-ocular eye movements in all directions of gaze.  No nystagmus.  No ptosis.   V:  Normal facial sensation.    VII:  Normal facial symmetry and movements.   VIII:  Normal hearing and vestibular function.   IX-X:  Normal palatal movement.   XI:  Normal shoulder shrug and head rotation.   XII:  Normal tongue strength and range of motion, no deviation or fasciculation.  MOTOR:  No atrophy, fasciculations or abnormal movements.  No pronator drift.  Abnormal movements: Right greater than left intention tremor, increased tone on the right, no cogwheeling. SENSORY:  Normal and symmetric perception of  touch COORDINATION/GAIT:  finger-to- nose-finger normal. rapid alternating movements bilaterally normal.  Stride is short, mild shuffling noted.  CURRENT MEDICATIONS:  Outpatient Encounter Medications as of 10/14/2023  Medication Sig   amLODipine  (NORVASC ) 10 MG tablet Take 1 tablet (10 mg total) by mouth daily.   aspirin  81 MG chewable tablet Chew 1 tablet (81 mg total) by mouth daily.   Calcium  Carbonate-Vitamin D  (CALCIUM  600/VITAMIN D  PO) Take 1 tablet by mouth 2 (two) times daily.   cetirizine (ZYRTEC) 10 MG tablet Take 10 mg by mouth as needed for allergies.   Cholecalciferol  (VITAMIN D3) 50 MCG (2000 UT) TABS Take 2,000 Units by mouth in the morning.   clonazePAM  (KLONOPIN ) 0.5 MG tablet Take 1 tablet (0.5 mg total) by mouth 2 (two) times daily.   cloNIDine  (CATAPRES ) 0.1 MG tablet Take 1 tablet (0.1 mg total) by mouth as needed prior to ECT treatment   COVID-19 mRNA vaccine 2023-2024 (COMIRNATY ) syringe Inject into the muscle. (Patient not taking: Reported on 09/14/2023)   dexlansoprazole  (DEXILANT ) 60 MG capsule Take 1 capsule by mouth daily   glycerin  adult 2 g suppository Use 1 suppository as needed if you have not had a bowel movement in  4 to 5 days   hydrochlorothiazide  (HYDRODIURIL ) 12.5 MG tablet Take 1 tablet (12.5 mg total) by mouth daily.   influenza vaccine adjuvanted (FLUAD  QUADRIVALENT) 0.5 ML injection Inject into the muscle.   Krill Oil 500 MG CAPS Take 500 mg by mouth daily. (Patient not taking: Reported on 09/14/2023)   lactulose  (CHRONULAC ) 10 GM/15ML solution Take 30 mLs (20 g total) by mouth 3 (three) times daily between meals as needed for mild constipation. (Patient taking differently: Take 20 g by mouth daily as needed for mild constipation.)   Lavender Oil 80 MG CAPS Take 160 mg by mouth at bedtime.   levothyroxine  (SYNTHROID ) 75 MCG tablet Take 1 tablet (75 mcg total) by mouth daily.   levothyroxine  (SYNTHROID ) 75 MCG tablet Take 1 tablet (75 mcg total) by  mouth daily.   levothyroxine  (SYNTHROID ) 75 MCG tablet Take 1 tablet (75 mcg total) by mouth daily.   linaclotide  (LINZESS ) 72 MCG capsule Take 1 capsule (72 mcg total) by mouth daily before breakfast. (Patient taking differently: Take 72 mcg by mouth in the morning and at bedtime.)   LORazepam  (ATIVAN ) 0.5 MG tablet TAKE 2 TABLETS (1 MG)  BY MOUTH EVERY NIGHT AT BEDTIME AND 1 TABLET EVERY 4 HOURS AS NEEDED FOR ANXIETY   losartan  (COZAAR ) 100 MG tablet Take 1 tablet (100 mg total) by mouth daily.   Lurasidone  HCl 60 MG TABS Take 1 tablet (60 mg total) by mouth daily after supper.   magic mouthwash w/lidocaine  SOLN Take 5 mLs by mouth 3 (three) times daily as needed for mouth pain.   memantine  (NAMENDA ) 10 MG tablet Take 1 tablet (10 mg total) by mouth 2 (two) times daily.   methylphenidate  36 MG PO CR tablet Take 1 tablet (36 mg total) by mouth daily. (Patient taking differently: Take 27 mg by mouth daily.)   metoprolol  tartrate (LOPRESSOR ) 25 MG tablet Take 1 tablet (25 mg total) by mouth as needed.   mirtazapine  (REMERON ) 7.5 MG tablet Take 1 tablet (7.5 mg total) by mouth at bedtime.   Multiple Vitamins-Minerals (MULTIVITAMIN WITH MINERALS) tablet Take 1 tablet by mouth daily.   Multiple Vitamins-Minerals (PRESERVISION AREDS 2 PO) Take 1 capsule by mouth in the morning and at bedtime.   naloxegol  oxalate (MOVANTIK ) 12.5 MG TABS tablet Take 2 tablets (25 mg total) by mouth daily. (Patient taking differently: Take 25 mg by mouth daily.  PRN)   neomycin -polymyxin b-dexamethasone  (MAXITROL ) 3.5-10000-0.1 OINT Apply thin line (1 cm) of ointment to right eye twice daily for 1 week.   ofloxacin  (OCUFLOX ) 0.3 % ophthalmic solution Instill 1 drop into right eye four times daily for 7 days.   omeprazole -sodium bicarbonate  (ZEGERID ) 40-1100 MG capsule Take 1 capsule by mouth daily before breakfast. (Patient not taking: Reported on 09/14/2023)   ondansetron  (ZOFRAN -ODT) 4 MG disintegrating tablet Place 1  tablet every 6 hours by translingual route as needed. (Patient not taking: Reported on 09/14/2023)   pneumococcal 20-valent conjugate vaccine (PREVNAR 20 ) 0.5 ML injection Inject into the muscle.   polyethylene glycol (MIRALAX  / GLYCOLAX ) 17 g packet Take 17 g by mouth as needed for mild constipation.   primidone  (MYSOLINE ) 50 MG tablet Take 2 tablets (100 mg total) by mouth every morning AND 1 tablet (50 mg total) at bedtime.   RSV vaccine recomb adjuvanted (AREXVY ) 120 MCG/0.5ML injection Inject into the muscle.   sennosides-docusate sodium  (SENOKOT-S) 8.6-50 MG tablet Take 2 tablets by mouth as needed.   thiamine  (VITAMIN B-1) 100 MG tablet Take 1 tablet (100 mg total) by mouth daily.   valACYclovir  (VALTREX ) 1000 MG tablet Take 2 tablets (2,000 mg total) by mouth at onset, then take 2 tablets 12 hours later.   vitamin B-12 (CYANOCOBALAMIN ) 1000 MCG tablet Take 1,000 mcg by mouth at bedtime. (Patient not taking: Reported on 09/14/2023)   vortioxetine  HBr (TRINTELLIX ) 20 MG TABS tablet Take 1 tablet (20 mg total) by mouth daily.   No facility-administered encounter medications on file as of 10/14/2023.        No data to display            09/14/2023   11:00 AM  Montreal Cognitive Assessment   Visuospatial/ Executive (0/5) 2  Naming (0/3) 1  Attention: Read list of digits (0/2) 1  Attention: Read list of letters (0/1) 0  Attention: Serial 7 subtraction starting at 100 (0/3) 1  Language: Repeat phrase (0/2) 1  Language : Fluency (0/1) 0  Abstraction (0/2) 1  Delayed Recall (0/5) 1  Orientation (0/6) 4  Total 12  Adjusted Score (based on education) 12   Thank you for allowing us  the opportunity to participate in the care of this nice patient. Please do not hesitate to contact us  for any questions or concerns.   Total time spent on today's visit was 56 minutes dedicated to this patient today, preparing to see patient, examining the patient, ordering tests and/or medications and  counseling the patient, documenting clinical information in the EHR or other health record, independently interpreting results and communicating results to the patient/family, discussing treatment and goals, answering patient's questions and coordinating care.  Cc:  Perri Ronal PARAS, MD  Camie Sevin 10/14/2023 7:42 AM

## 2023-10-14 NOTE — Patient Instructions (Signed)
 Will call for you follow  up  Dr. Georjean for seizures  EEG 24 h  Limit use of pain relievers to no more than 2 days out of the week.  These medications include acetaminophen , NSAIDs (ibuprofen/Advil/Motrin, naproxen/Aleve, triptans (Imitrex/sumatriptan), Excedrin, and narcotics.  This will help reduce risk of rebound headaches. Be aware of common food triggers:  - Caffeine :  coffee, black tea, cola, Mt. Dew  - Chocolate  - Dairy:  aged cheeses (brie, blue, cheddar, gouda, Parmasan, provolone, romano, Swiss, etc), chocolate milk, buttermilk, sour cream, limit eggs and yogurt  - Nuts, peanut butter  - Alcohol  - Cereals/grains:  FRESH breads (fresh bagels, sourdough, doughnuts), yeast productions  - Processed/canned/aged/cured meats (pre-packaged deli meats, hotdogs)  - MSG/glutamate:  soy sauce, flavor enhancer, pickled/preserved/marinated foods  - Sweeteners:  aspartame (Equal, Nutrasweet).  Sugar and Splenda are okay  - Vegetables:  legumes (lima beans, lentils, snow peas, fava beans, pinto peans, peas, garbanzo beans), sauerkraut, onions, olives, pickles  - Fruit:  avocados, bananas, citrus fruit (orange, lemon, grapefruit), mango  - Other:  Frozen meals, macaroni and cheese Routine exercise Stay adequately hydrated (aim for 64 oz water  daily) Maintain proper stress management Maintain proper sleep hygiene Do not skip meals Consider supplements:  magnesium  citrate 400mg  daily, riboflavin 400mg  daily, coenzyme Q10 100mg  three times daily.

## 2023-10-14 NOTE — Progress Notes (Signed)
 Meghan Oliver [id number] Jun 12, 1941 83 y.o.  Subjective:   Patient ID:  Meghan Oliver is a 83 y.o. (DOB 12/12/1940) female. Patient was last seen August 21, 2018 Chief Complaint:  No chief complaint on file.    Meghan Oliver presents to the office today for follow-up of Severe TR bipolar depression with psychotic features including catatonia.  11/30/2018 was the last visit and the following was noted: Had ECT yesterday at [facility].  Last was Jan 8 and was doing well then.  Next scheduled April 15.  Had a lithium  level from there which is pending. Pretty good until the last 10 days to 2 weeks with less energy and motivation.  Bothered by old sick dog.  Worried about how that will affect her.  Has slept with the dog.  [place]ed [place].  Going to gym and playing [place].  Took the class and didn't feel she caught on quickly. Wonders if it is bc of the ECT memory effects.  Bridge is harder and not playing.  Planning to return to [facility] mid April.   spent the winter in [place]  per usual..  No significant depression since here. ECT frequency about 6 weeks.  Gets very anxious the day of the ECT.  ECT consistently for a year.  Best in mood in 7-8 years.  Also the pacemaker helped.  Friends notice the benefit.  Plan:  Option increase in the nortriptyline  if needed.  Considered this.  They agree and would like to increase nortriptyline  to 75 to try to reduce tendency to depression before the ECT.   03/19/19  Addendum: Here the contents from an email from the nurse for Meghan Oliver  Hello Dr. Geoffry,   I hope you and your family are healthy and well. I took Meghan Oliver to [facility] for ECT and here are labs from 01/26/19. I am going to also forward them to [facility] for follow up with her LFT's. They are still higher than I would like.  Her lithium  level was 0.51 on a daily dose of 300mg  qohs x 4 nights- alternating with 450mg  qohs x 3 nights. She is not demonstrating any signs of elevated levels, minimal hand tremors,  less anxiety and restlessness since her ECT treatment.  Her Nortryptyline level was 140 on 75mg  qhs.  I would like to continue her current dosages of both the above meds and recheck her lithium  level at her next scheduled ECT of 03/23/19.  Please let me know of any changes you would like to make. Take care, Meghan Dancer RN Lithium  level was 0.4 on the 450mg  alt with 300 mg QOD. No changes were made in meds.  11/15/2019 phone call: Telephone call from patient's husband Dr. [name] Oliver on 11/14/2019  Patient is scheduled for ECT soon for maintenance ECT.  He thinks it is been 2-1/2 months since the last ECT but he is going to check the date for sure.  It is still be done being done at [facility].  He is wondering about skipping this treatment because the patient has continued to be free of depression and seems cognitively clearer as these ECT treatments have been spread out further from each other.  Patient remains on medications as prescribed.  If it is truly been over 2 months since the last ECT then it is reasonable to consider discontinuing ECT.  It is unlikely that ECT with the frequency of greater than 2 months is significantly helpful at preventing her relapse.  If however the ECT  frequency is less than every 2 months it could still be helping to prevent recurrence.  He indicated he would consider this information and discuss it with her ECT team and make a decision.  They are heading to [place]  in the next few weeks.  Needs to schedule an appointment with me for follow-up because it is been many months.  He agrees.   02/19/2020 appt, the following noted: Moving to [facility] in a few months.  Ready to downsize.   Stopped ECT as discussed and has not been more deprressed.   Walks dog 4 times daily.   Lithium  0.7 on  01/30/20 on lithium  300 mg plus 150 mg on M, W, F Nortriptyline  70 on 75 mg HS. No SE except tremor.  Balance is not great.  Very happy.   No depression since here.  No mood swings.  Sleep  and appetite is OK.  No med changes:  04/03/2020 TC with the following noted: Patient's husband called stating that her tremor was a little worse and she was having some stutter. No evidence of stroke otherwise. Oliver thing would be to check serum lithium  level and BMP.  Order written.  Have asked her husband to let us  know about the lab to which it should be sent.  04/22/20 TC witht he following noted: Lithium  level is not dangerously high but it is has crept up to 1.0 which is higher than desired for her.  Our goal for her is 0.5-0.7.  At the current level it is likely causing side effects so we should reduce the dosage from lithium  300 mg plus 150 mg on M, W, F  TO 300 mg daily.  Meaning drop off the 150 mg capsule.  It will take about 2 weeks for the level to gradually come down to the desired level.  05/08/2020 appt with the following noted: Seen with Meghan Oliver and Meghan nurse. Moving to [facility] and feels OK about it.  I think I'll be fine.   Pretty good with balance.  Doing yoga and it helps. Ronal Rung has cancer. Old dog 65 & 1/2 yo still living. [name]'s concerns when went to [place] for D's BD she had difficulty time with tremor lethargy, more confusion.  Better when go back home. [name] notes using more lorazepam . At times gait is unsteady. Not significantly depressed.  Can be anxious at times.  Eating okay.  Sleeping okay.  Is easily confused under stress. Plan: reduce lithium  to 300 mg nightly.  07/14/20 appt with the following noted: Seen with her husband as well as her family nurse. Feeling fine and pleased with that.   Some anxiety over the move to smaller place. [name]'s twin also having heart problems Gene. [name]'s kids came and visited.    Plan: Continue nortriptyline  75 mg, paroxetine  20 mg, lithium  300 mg, Namenda  10 mg twice daily, lorazepam  0.5 mg 1/2-1 3 times daily as needed, mirtazapine  7.5 mg nightly  02/27/2021 appointment with the following noted:  Seen with Meghan Oliver and nurse  Meghan Received email from nurse Meghan Oliver on 02/24/2021 indicated that he had moved into [facility] living in May.  Shandale has gradually become more depressed and more anxious using lorazepam  as prescribed 3 times daily.  More memory issues.  She has remained engaged and initiating appropriate conversation.  She is very worried about her 44 year old dog who is in poor health and fears she will become more depressed when the dog dies.  She and her husband do not  wish to prefer to pursue further ECT unless absolutely necessary. Likes [facility] so far.   No depression by her report.  Sold big house in Feb.  It worked out well.   Worries about her dog 27 yo will die soon. Meghan Oliver agrees she's done well with depression.   Enjoys things and has interests. Plan: Continue Lithium  300 mg daily  Continue nortriptyline  75 mg daily Continue paroxetine  20 mg daily Continue lorazepam  0.5 mg 3 times daily for both anxiety and catatonia Continue vitamin B6 and primidone  for tremor Cerefolin NAC 2 daily.  05/13/2021 appointment with the following noted: Happy with [facility].  Meghan Oliver and she agree is doing well with depression.SABRA Richard socialization. She complains of memory.   Family visited and getting along with Alisa better.  Meghan Oliver says Alisa is acting better.  Goes to dinner and activities together now.  Better relationship with Gene.. Tremor has been OK.   Ativan  usually 0.25 mg TID and tolerated. Anxiety managed with this generally. Molly dog health more stable. To [place] before T'giving until April.   Plan: Continue Lithium  300 mg daily  Continue nortriptyline  75 mg daily Continue paroxetine  20 mg daily Continue lorazepam  0.5 mg 3 times daily for both anxiety and catatonia Continue vitamin B6 and primidone  for tremor   08/14/2021 appointment with the following noted: Seen with her husband [name] and nurse Meghan. Everyone reports that her mood has been stable.  Her anxiety is manageable with as needed lorazepam .   She is still happy with the transition to [facility].  They are preparing to go to [place]  for the winter per usual but may sell their house father there. Tolerating meds without any unusual side effects. Is dealing with grief over the loss of her dog Molly.  03/10/2022 appt noted:  seen with Meghan Oliver and nurse Meghan [facility] house. [name] not good phsyically hard to play golf.  Been really hard. He'll be 85 in July.  Some stress scheduling with D's.   60 th wedding necessary this month.   Mood up and down.  Sister in law across the street can be difficult and demeaning.  She feels unsettled.  Some worry. Some back pain limiting activity.   Sleep is ok except with pain awakening.   No SE, except tremor some worse about 3-5 and helped by lorazepam  0.25 mg then No greater than 1 mg lorazepam  daily Patient denies any recent difficulty with anxiety except as noted.  Patient denies difficulty with sleep initiation or maintenance. Denies appetite disturbance.    Patient has some difficulty with concentration.  Patient denies any suicidal ideation.  Usually takes Ativan  at night and sleeps well 8 hours.   Plan no med changes  10/23/213 appt noted: Still doing well. Pneumonia and hosp for 6 days 3 weeks ago.  Viral.  Improving at this time. Increased tremor gradually in 3-4 mos helped by lorazepam  avg 1.00-1.25 mg daily. No change in primidone . Total R hip August 2023 and done well.  Finishing home PT.   Appetite poor for 7-8 mos.  2 protein drinks daily but may not eat more than 1 good meal a day. [facility]. Substantial memory problems in hospital and in unusual enviornments. Nurse notices decline.  B with LBD passed in August. No pain meds or muscle relaxants beyond pain. No depression.  Meghan Oliver note sometimes she has less motivation than others. [name] notes some normal waxing and waning of mood. Good social interaction.   Not spending winter in [facility] this  year.  D still lives there. Plan: Increase mirtazapine  15  mg HS for appetite.  Also used for sleep and depression.  09/23/22 appt noted:  with nurse and [name] [name] feeling bad with more pain and fatigue.  [name] has CHF.   Made [place] sad.  Lost a cgood friends.  Rough time.  Mostly worry about [name].  She feels afraid to be alone if something happens.   Not sig depressed.    Just worry . Meghan Oliver can't be as active.  Taking a shower is hard.   She's back exercising again.   Went to [employer] for Thanksgiving in [facility] and plan to go back for Christmas. It's hard to travel. Told nurse she was depressed. Appetite is better and once asleep is OK. Using more lorazepam  for anxiety 3-4 daily. Plan: Continue Lithium  300 mg daily  Continue nortriptyline  75 mg daily Continue paroxetine  20 mg daily Continue lorazepam  0.5 mg 3 times daily for both anxiety and catatonia Continue vitamin B6 and primidone  for tremor Increased and it helped mirtazapine  15 mg HS for appetite.  Also used for sleep and depression.  10/19/22  RTC  [name] reports not doing well.  Went to [facility].  Recent pneumonia.  They are thinking this is aspiration pneurmonia. Lethargic and anxious with tremor. Ruminating.  Hot to cold .  Low tolerance.  Going downhill quickly.  She's verbalizing depression and desire to isolate.  Won't go out.  Started within the last 2 weeks. Usually doesn't complain when she's sick.   [name]'s biggest concern is that [place]ppard is having trouble handling this bc she's up at night rambling and not really safe to be up at night.    Will see her 10/22/22 Get lithium  and nortriptyline  levels.   Lorene Macintosh, MD, [facility]     10/22/22 emergency appt with nurse Meghan and Meghan Oliver [name]: Pneumonia Oct and then again 2nd week January.  It appears to have largely resoved.  It's really bad.  Nausea. Anxious bc [name] ill.  Doesn't want him to leave.  Meghan sees anxiety triggering anxiety with little to no appetite.  Hypersensitive to stimuli like light and noises.  Doesn't want TV on.  [name] says totally reversed in last 6  weeks.    [name] reports she's overemotional.  Friends died. Losing wt 91.4# today. More trouble with sleep. Lorazepam  increased to 2 mg daily.  Not sleepy with it.  Doesn't seem to be helpful. Last week neglect hygiene but better this week a little. [name] says STM is poor at times and at times disoriented if in [place].  Will walk in bathroom and wonder where sh is. Barium swallow pending for evaluation of refulex.    10/26/22 RTC:  RTC   No improvement since Friday.  Gradually less engaged.  Can be overstimulated with visitors.   Disc nortriptyline  level 115 is a little higher than it was and higher levels can cause SE in elderly.   Reduce nortriptyline  to 50 mg HS. Taking lorazepam    Consider low dose of Vraylar or pramipexole.  Or Spravato .    Lorene Macintosh, MD, [facility]     11/15/22 appt noted: Dialogue with Dr. Rolan cardiologist agreed pt should be stable enough from CV perspective to receive Spravato  which could elevate BP.  If that occurs use prn metoprolol  25 mg. Disc in detail with Dr. Cloretta and he agrees Spravato  is appropriate though not FDA approved for bipolar depression.  Disc 2 metanalysis in support of this and this alernative  is safer and likely better tolerated than the option of ECT and faster than options of trials of Trintellix , Auvelity, Latuda  etc. Tolerating meds.  She is depressed and very anxious and intermittently confused.  Taking clothes on and off bc rapid changes in comfort.  Worried over [employer].  Sleep disrupted appetite some better with mirtazapine . Received Spravato  56 mg Oliver time today and was dissociated.  Resolved over 2 hours.  No HA, NV.  Was distressed moderately and very ambivalent about Tx.  Meghan Oliver and nurse supportive of tx plan and for the pt.    11/24/22 appt noted: Current meds: nortriptyline  50 mg HS, paroxetine  20 mg daily, lithium  300 mg nightly, mirtazapine  30 mg nightly, memantine  10 mg twice daily.  Lorazepam  0.5 mg every 4 to 6 hours as needed  catatonia or severe anxiety. Received Oliver dose of Spravato  84 mg today.  She was more noticeably dissociated than with the 56 mg dose.  It did resolve over the 2-hour course of observation although she remained somewhat unsteady and needed to leave via wheelchair.  She did not have headache nausea or vomiting.  She remains ambivalent about the treatment. Husband notes she has continued to be anxious and depressed and was extremely anxious yesterday and somewhat confused.  At times would say things that did not make sense.  He remained supportive of the treatment while she remains ambivalent about the treatment.  Her appetite is good and she is eating well.  Sleep is variable.  Meghan Oliver reported at times will be almost normal after receiving an extra dose of lorazepam  0.5 mg prn but this only lasts an hour or so. Plan: Continue Lithium  300 mg daily  Continue nortriptyline  50 mg daily Continue paroxetine  20 mg daily Continue vitamin B6 and primidone  for tremor Imirtazapine 30 mg HS for appetite,  used for sleep and depression.  Appetite better Switch lorazepam  to Loreev ER 2 mg AM and use lorazepam  0.5 mg prn.  For catatonia in hopes of more consistent relief.  Decision based on observation of periods of almost normalcy after some BZ doses.  We discussed potential switch of paroxetine  and nortriptyline  to Auvelity or Trintellix  or possibly Latuda .  However these switches will be difficult and it is hoped that the Spravato  may provide enough improvement to allow a switch in medicine more tolerable.  11/26/22 appt noted: Current meds: nortriptyline  50 mg HS, paroxetine  20 mg daily, lithium  300 mg nightly, mirtazapine  30 mg nightly, memantine  10 mg twice daily.  Lorazepam  0.5 mg every 4 to 6 hours as needed catatonia or severe anxiety.  Started Loreev 2 mg daily over weekend Received Spravato  84 mg today.   Spravato  with expected dissociation.  She didn't experience relief from negative emotion.  Dissociation  resolved over observation period.  No N, V, HA.   Still high anxiety and dread of most everything.  Depressed with negative thought predominates.  Meghan Oliver notes periods of confusion and then maybe an hour of clarity sometimes after the dosing of lorazepam .  Then back to severe sx.  Eating is fair. No SI.  Fearful generally.  Needs help with ADLs.  Cannot take meds on her own. Tolerating meds without SE No noticeable effect from Loreev in terms of benefit or SE  12/01/22 appt noted: Received a phone call from nurse Meghan this morning to call as soon as possible this patient very agitated and confused.  Thrashing around in bed.  At times talking about dying out of the fear  that she was going to die.  Highly anxious.  Not very cooperative with drinking water  or taking medicines this morning. Discussed the situation with her husband as well and we have to options: 1's if we can get her to the office to receive Spravato  is scheduled today that may provide some immediate relief and forestall and may be prevent hospitalization.  The second option is to proceed directly to hospitalization.  If she is unable to take nutrition and hydration hospitalization will be necessary. Discussed that appeared to be catatonic hyperactivity and agitation which has not responded to the extended release lorazepam  2 mg a day.  Therefore she was given 1 mg of Ativan  this morning and did calm down enough to make it to the Spravato  administration this afternoon.  She was able to cooperate with Spravato  administration and tolerated it well with typical levels of dissociation which gradually resolved over the 2 hours.  She was able to eat and drink fluids prior to coming to the appointment. Current meds: nortriptyline  50 mg HS, paroxetine  20 mg daily, lithium  300 mg nightly,  memantine  10 mg twice daily.  Lorazepam  0.5 mg every 4 to 6 hours as needed catatonia or severe anxiety.  Started Loreev 2 mg daily over weekend olanazpine 2.5 mg  HS Received Spravato  84 mg today.   Husband notes that after Spravato  administration on Monday she was much improved for a period of several hours and was able to have conversations with her family members and make decisions about distributing some of her jewelry and other family decisions.  However over Monday evening into Tuesday she became more agitated and confused and anxious again.  There have been no obvious effects of taking the olanzapine  but it does not appear that long-acting lorazepam  2 mg as provided any significant relief. Plan: Continue Lithium  300 mg daily  Continue nortriptyline  50 mg daily Continue paroxetine  20 mg daily Continue vitamin B6 and primidone  for tremor DC mirtazapine  For catatonia: lorazepam0.5 mg q 4 hour mg daily as tolerated for catatonia with dose spread through the day..  Nurse manages. Restart Loreeve 2 mg daily and use prn lorazepam  for catatonia Increase olanzapine  5 mg HS for TRD, anxiety, catatonia, delusions of persecution  12/06/22 appt noted; Psych meds: as above except increase Loreev to 2 mg BID for severe catatonia. nortriptyline  50 mg HS, paroxetine   20 mg daily, lithium  300 mg nightly,  memantine  10 mg twice daily.  Lorazepam  0.5 mg every 4 to 6 hours as needed catatonia or severe anxiety.  Started Loreev 2 mg increased BID for severe catatonia,  increased olanazpine 5 mg HS for 3 days. Received 5th dose of Spravato  84 mg today.    She was able to cooperate with Spravato  administration and tolerated it well with typical levels of dissociation which gradually resolved over the 2 hours.  She was able to eat and drink fluids prior to coming to the appointment.   She still feels confused and depressed and anxious. Per family had periods over the weekend where she was confused thinking her Meghan Oliver was dead even though he was right in front of her.  Also thinking she is going to die and not get better.  Is eating and drinking fluids well.  Has needed extra  lorazepam  to sleep.  Limited periods of lucidity per Meghan Oliver. Tolerating high dose lorazepam  but limited benefit for catatonia. Plan: Continue Lithium  300 mg daily  reduce nortriptyline  25 mg daily in anticipation of change Reduce paroxetine  10  mg daily in anticipation of change Continue vitamin B6 and primidone  for tremor For catatonia: lorazepam0.5 mg q 4 hour mg daily as tolerated for catatonia with dose spread through the day..  Nurse manages. DT severity of catatonic hyperactivy Loreeve 2 mg BID and use prn lorazepam  for catatonia Continue olanzapine  5 mg HS for TRD, anxiety, catatonia, delusions of persecution  12/08/22 appt noted: Psych meds:  Loreev to 2 mg BID for severe catatonia. nortriptyline  25 mg HS, paroxetine  10 mg daily, lithium  300 mg nightly,  memantine  10 mg twice daily.  Lorazepam  0.5 mg every 4 to 6 hours as needed catatonia or severe anxiety.  Started Loreev 2 mg increased BID for severe catatonia,  increased olanazpine 5 mg HS for 3 days. Received 6th dose of Spravato  84 mg today.    She was able to cooperate with Spravato  administration and tolerated it well with typical levels of dissociation which gradually resolved over the 2 hours.  She was able to eat and drink fluids prior to coming to the appointment.   She still feels confused and depressed and anxious. Hopeless.  Thoughts she is going to die. Per nurse and Meghan Oliver no sig improvement noted with spravato .   No current alcohol problems  01/14/2023 appointment on an urgent basis: Hospitalized due to psychiatry at [facility] from 12/10/2022 until 01/03/2023.  Hospitalized for bipolar disorder severe depression with catatonia.  Received ECT and Latuda  60 mg daily was started.  Stopped paroxetine , lithium , nortriptyline , and mirtazapine .  Lorazepam  was switched to clonazepam . Current psych meds clonazepam  0.5 mg twice daily, Latuda  60 mg daily.  Memantine  10 mg twice daily, weekly ECT rec Wt 101# and better appetite.  Sleep not great  but ok.  Will lie awake some.  Meghan Oliver says it's pretty good. Alert in the day.  No SE with meds.  Tremor better.   Lorazepam  only on ECT days. Plan:  For catatonia: Continue clonazepam  0.5 mg twice daily also for anxiety Continue Latuda  60 mg daily Continue lorazepam  0.5 mg as needed which is currently being used rarely. Recommend continue ECT  weekly as [facility] recommends  02/22/23    Meghan May nurse called at 11am.  Meaghen is doing better, receiving ECT twice this week. Not having breakthrough anxiety now and Meghan wants to decrease her morning dose of Klonopin  to help decrease her sedation. She wants to give her 1/2 of 0.5mg  Klonopin  in the am and just give her regular dose the other two times a day. Is this okay.        Electronically signed by Joshua Craven L at 02/22/2023 11:21 AM  03/11/23 appt:  Current psych med:  clonazepam  0.25 mg AM and 0.5 mg HS on 5/21, Latuda  60,  Worry anxiety.  Not as happy as those around here. Reduced ECT to weekly does not maintain the benefit.  But they didn't want to continue it.   [name] has  talked with ECT doc about holding off on ECT and missed dose but she's gotten more depressed.   Did twice weekly for awhle and it was beneficial.  Sched for Tuesday Plan: Start Trintellix  off label for dep.  Trying to find solution that would allow stopping ECT Recommend continue ECT  but increase to twice weekly as [facility] recommends  04/15/23 appt noted:  with nurse and Meghan Oliver Reduced clonazepam  to 0.25 mg AM and 0.5 mg HS, Latuda  60, incr Trintellix  10 ECT once or twice weekly.  Took 13 day holiday with  ECT and progressively downhill.  Worse if goes more than a week without ECT.   Some interest but not much initiative.  Read paper some.  Can initiate conversation with nurse with some recent details.  Still other memory problems frustrating to everyone.   Some memory px even before ECT but worse. Sleep good.  Feels good today and appetite ok.   D been at the house  lately.  D from [place] No excessive sleep.   She naps some easily in the day.  No sig changes with less clonazepam .  Minimal effect from counseling DT memory. No new concerns.   No SE.    05/20/23 appt noted: Tolerated increase in Trintellix  to 15 mg daily.  Reduced clonazepam  0.25 mg AM and 0.375 mg HS. No prn lorazepam .  No dep verbalized since here.  Trouble with sleep.  Intial insomnia.  No caffeine .  Some napping.  Not drowsy now.  Will lay on couch after breakfast.   Not anxious about anything.  Sleep trouble in the last few days. Last ECT a little over 2 weeks to wait another week if possible.  Meghan Oliver thinks she is more awake with less ECT.  Will spontaneously seek out paper and fix several. Where do we go from here?   Per Meghan Oliver .  Wants her to be able to stop ECT. Nurse notes better exec function.  Nurse has seen some lack of motivation and little spontaneous speech.  Won't engage in conversation much per family.   Still some rumination.   She has no other complaints other than some physical concerns.  Plan: For catatonia: clonazepam  to 0.25 mg AM and 0.375 mg HS for 2 weeks then 0.25 mg BID for cog reasons and not currently catatonic Continue Latuda  60 mg daily Continue lorazepam  0.5 mg as needed which is currently being used rarely. Incr Trintellix  to 20 mg daily off label for bipolar dep.  Trying to find solution that would allow stopping ECT Recommend continue ECT  but increase to twice weekly as [facility] recommends but considering reduction to every 3 weeks is reasonable.  She is not dep now.  06/24/23 appt noted: urgent appt with Meghan Oliver and nurse. Tolerated increase in Trintellix  to 20 mg daily.  Reduced clonazepam  0.25 mg AM and 0.375 mg HS. No prn lorazepam , mirtazapine  7.5 mg HS, Latuda  60.  Memantine  10 BID Last eCT Monday every 3-3&1/2 weeks.   Mood and anxiety dipped with time away from ECT.   ECT still helpful.  Away from ECT harder to read and watch TV.   [facility] wants 2 week  interval. Minimal initiation of conversation with others.   Not sure what to say in conversation.   General malaise over the last 10 days. Appetite is fair.  D Darice came last weekend and was helpful.  Not dep today but was before ECT this week.  Meds have not prevented recurrence. Meghan Oliver asks about restarting lithium  which helped in the past.  07/25/23 appt : seen with nurse Last ECT 07/19/23.   Lately weekly bc sx got worse.  Nurse has seen improvement.  [facility] saying it is helpful. Added Concerta  18 mg AM.  No SE.  Nurse noticed a little more attempt to conc but not a lot of benefit.   No prn lorazepam  needed.   Meghan Oliver says she's sleepy a lot of the time.  Nurse suspects clonazepam  as the source of sleepiness. No benefit with more clonazepam . Sleep helped by mirtazapine  7.5 mg HS.  No SE with Trintellix  20 mg  Apppetite pretty normal.   3 days without primidone  but tremor noticeably worse. Planning to go to [facility] for T'giving week. Meghan Oliver using cane and less mobile. Has assistance at home for parts of the day.  Uses Rolator at night when gets up to urinate.  08/26/23 appt noted:  with nurse meds No sig dep nor anxiety.  Sleep good.  Untreated OSA per family.   Per nurse more effort with things with increase concerta  27 AM.  No SE.   Weekly ECT ongoing.  Will be 11 days . Going to [facility] for a week for T'giving.  Looking forward to seeing g'kids.   No prn lorazepam  needed.   Has pacemaker for heart rate.   ECT clearly helping.   Meghan Oliver agrees gradually improving.    Appetite ok.  Wt consistent 97-99#.   Neuro FU for eval cog 09/14/23.   Needs help with ADLs but less than before.   Plan: increase off label Concerta  DT no sig change and no SE at 27 mg so increase to 36 mg AM for TRD and cog problems  10/14/23 appt noted:  with nurse.   Psych meds: Clonazepam  0.25  nightly for catatonia, lorazepam  0.5 mg as needed he anxiety or insomnia, Latuda  60 mg, memantine  10 mg twice daily, Concerta  36 mg every morning,  primidone  100 every morning and 50 nightly for tremor.,  Trintellix  20 mg daily. No difference with increase Concerta .  No benefit.   Episode accidental taking clonazepam  2 mg HS and excessively drowsy for 3 hours.   Neuro reported potential SZ activity on EEG.  Plans 24 hour EEG to evaluate.  She & Meghan Oliver had Covid and missed ECT for a month.  When missed ECT was worse with less engagement, lethargy, poor appetite and lack of initiation.  Better with resuming ECT.   Good evening last night and went out to dinner.   Cindy here from [place].  Last ECT 10/03/23 and then 10/11/23 and next will be 10/18/23.  Only weekly ECT until fully better then back to every other day. Now dep better again with ECT and less distressed.   Increase stress and anxiety with [name]'s B Joe with lymphoma recurrence.   Appetite OK now.    ECT-MADRS    Flowsheet Row Clinical Support from 12/08/2022 in [facility]  MADRS Total Score 54      PHQ2-9    Flowsheet Row Office Visit from 01/14/2023 in [facility] Office Visit from 10/11/2022 in [facility] Office Visit from 08/17/2022 in [facility] Office Visit from 07/19/2022 in [facility] Office Visit from 03/24/2022 in [facility]  PHQ-2 Total Score 4 6 0 0 1  PHQ-9 Total Score 14 -- -- -- --      Flowsheet Row ED to Hosp-Admission (Discharged) from 07/01/2022 in [place] [facility] Admission (Discharged) from 05/18/2022 in [facility] Pre-Admission Testing 60 from 05/10/2022 in [facility]  C-SSRS RISK CATEGORY No Risk No Risk No Risk        Past Psychiatric Medication Trials:  Prozac  40 with lithium  and Zyprexa  2.5 to 5 mg,  nortriptyline , paroxetine , Effexor 225 Wellbutrin 300 mirtazapine ,  Others  Pramipexole 0.625 daily hallucinations  Rexulti EPS, Abilify 15, Vraylar 1.5 1 week,  Latuda  10 mg SE, Olanzapine  10  briefly Caplyta  briefly no response   Lamotrigine level  7.5  Namenda ,  lorazepam ,  Clonazaam 0.5 BID  no response Spravato  after 6 tx of 84 mg and 2 of 56 mg .  Hospitalized at [facility] on psychiatric unit from 12/10/2022 to 01/03/2023 and received ECT and started Latuda  60 mg daily.  Stopped paroxetine , nortriptyline , and lithium .  this list is not exhaustive  Review of Systems:  Review of Systems  Constitutional:  Positive for fatigue. Negative for appetite change.  Cardiovascular:  Negative for chest pain and palpitations.  Gastrointestinal:  Negative for nausea.       Linzess  and Mozantic managed   Musculoskeletal:  Positive for gait problem.  Neurological:  Positive for dizziness, weakness and headaches. Negative for tremors.       Some balance issues  Psychiatric/Behavioral:  Positive for confusion and decreased concentration. Negative for agitation, behavioral problems, dysphoric mood, hallucinations, self-injury, sleep disturbance and suicidal ideas. The patient is not nervous/anxious and is not hyperactive.     Medications: I have reviewed the patient's current medications.  Current Outpatient Medications  Medication Sig Dispense Refill   amLODipine  (NORVASC ) 10 MG tablet Take 1 tablet (10 mg total) by mouth daily. 90 tablet 1   aspirin  81 MG chewable tablet Chew 1 tablet (81 mg total) by mouth daily.     Calcium  Carbonate-Vitamin D  (CALCIUM  600/VITAMIN D  PO) Take 1 tablet by mouth 2 (two) times daily.     cetirizine (ZYRTEC) 10 MG tablet Take 10 mg by mouth as needed for allergies.     Cholecalciferol  (VITAMIN D3) 50 MCG (2000 UT) TABS Take 2,000 Units by mouth in the morning.     clonazePAM  (KLONOPIN ) 0.5 MG tablet Take 1 tablet (0.5 mg total) by mouth 2 (two) times daily. 180 tablet 0   cloNIDine  (CATAPRES ) 0.1 MG tablet Take 1 tablet (0.1 mg total) by mouth as needed prior to ECT treatment 20 tablet 2   COVID-19 mRNA vaccine 2023-2024 (COMIRNATY ) syringe Inject into  the muscle. (Patient not taking: Reported on 09/14/2023) 0.3 mL 0   dexlansoprazole  (DEXILANT ) 60 MG capsule Take 1 capsule by mouth daily 90 capsule 3   glycerin  adult 2 g suppository Use 1 suppository as needed if you have not had a bowel movement in  4 to 5 days 25 suppository 0   hydrochlorothiazide  (HYDRODIURIL ) 12.5 MG tablet Take 1 tablet (12.5 mg total) by mouth daily. 90 tablet 1   influenza vaccine adjuvanted (FLUAD  QUADRIVALENT) 0.5 ML injection Inject into the muscle. 0.5 mL 0   Krill Oil 500 MG CAPS Take 500 mg by mouth daily. (Patient not taking: Reported on 09/14/2023)     lactulose  (CHRONULAC ) 10 GM/15ML solution Take 30 mLs (20 g total) by mouth 3 (three) times daily between meals as needed for mild constipation. (Patient taking differently: Take 20 g by mouth daily as needed for mild constipation.) 473 mL 3   Lavender Oil 80 MG CAPS Take 160 mg by mouth at bedtime.     levothyroxine  (SYNTHROID ) 75 MCG tablet Take 1 tablet (75 mcg total) by mouth daily. 90 tablet 2   levothyroxine  (SYNTHROID ) 75 MCG tablet Take 1 tablet (75 mcg total) by mouth daily. 90 tablet 2   levothyroxine  (SYNTHROID ) 75 MCG tablet Take 1 tablet (75 mcg total) by mouth daily. 90 tablet 2   linaclotide  (LINZESS ) 72 MCG capsule Take 1 capsule (72 mcg total) by mouth daily before breakfast. (Patient taking differently: Take 72 mcg by mouth in the morning and at bedtime.) 180 capsule 0  LORazepam  (ATIVAN ) 0.5 MG tablet TAKE 2 TABLETS (1 MG)  BY MOUTH EVERY NIGHT AT BEDTIME AND 1 TABLET EVERY 4 HOURS AS NEEDED FOR ANXIETY 270 tablet 0   losartan  (COZAAR ) 100 MG tablet Take 1 tablet (100 mg total) by mouth daily. 90 tablet 1   Lurasidone  HCl 60 MG TABS Take 1 tablet (60 mg total) by mouth daily after supper. 90 tablet 0   magic mouthwash w/lidocaine  SOLN Take 5 mLs by mouth 3 (three) times daily as needed for mouth pain. 120 mL 0   memantine  (NAMENDA ) 10 MG tablet Take 1 tablet (10 mg total) by mouth 2 (two) times  daily. 180 tablet 1   methylphenidate  36 MG PO CR tablet Take 1 tablet (36 mg total) by mouth daily. (Patient taking differently: Take 27 mg by mouth daily.) 90 tablet 0   metoprolol  tartrate (LOPRESSOR ) 25 MG tablet Take 1 tablet (25 mg total) by mouth as needed. 45 tablet 3   mirtazapine  (REMERON ) 7.5 MG tablet Take 1 tablet (7.5 mg total) by mouth at bedtime. 90 tablet 0   Multiple Vitamins-Minerals (MULTIVITAMIN WITH MINERALS) tablet Take 1 tablet by mouth daily.     Multiple Vitamins-Minerals (PRESERVISION AREDS 2 PO) Take 1 capsule by mouth in the morning and at bedtime.     naloxegol  oxalate (MOVANTIK ) 12.5 MG TABS tablet Take 2 tablets (25 mg total) by mouth daily. (Patient taking differently: Take 25 mg by mouth daily. PRN) 60 tablet 3   neomycin -polymyxin b-dexamethasone  (MAXITROL ) 3.5-10000-0.1 OINT Apply thin line (1 cm) of ointment to right eye twice daily for 1 week. 3.5 g 1   ofloxacin  (OCUFLOX ) 0.3 % ophthalmic solution Instill 1 drop into right eye four times daily for 7 days. 5 mL 1   omeprazole -sodium bicarbonate  (ZEGERID ) 40-1100 MG capsule Take 1 capsule by mouth daily before breakfast. (Patient not taking: Reported on 09/14/2023) 90 capsule 3   ondansetron  (ZOFRAN -ODT) 4 MG disintegrating tablet Place 1 tablet every 6 hours by translingual route as needed. (Patient not taking: Reported on 09/14/2023) 20 tablet 0   pneumococcal 20-valent conjugate vaccine (PREVNAR 20 ) 0.5 ML injection Inject into the muscle. 0.5 mL 0   polyethylene glycol (MIRALAX  / GLYCOLAX ) 17 g packet Take 17 g by mouth as needed for mild constipation.     primidone  (MYSOLINE ) 50 MG tablet Take 2 tablets (100 mg total) by mouth every morning AND 1 tablet (50 mg total) at bedtime. 270 tablet 0   RSV vaccine recomb adjuvanted (AREXVY ) 120 MCG/0.5ML injection Inject into the muscle. 0.5 mL 0   sennosides-docusate sodium  (SENOKOT-S) 8.6-50 MG tablet Take 2 tablets by mouth as needed.     thiamine  (VITAMIN B-1) 100  MG tablet Take 1 tablet (100 mg total) by mouth daily. 100 tablet 3   valACYclovir  (VALTREX ) 1000 MG tablet Take 2 tablets (2,000 mg total) by mouth at onset, then take 2 tablets 12 hours later. 30 tablet 1   vitamin B-12 (CYANOCOBALAMIN ) 1000 MCG tablet Take 1,000 mcg by mouth at bedtime. (Patient not taking: Reported on 09/14/2023)     vortioxetine  HBr (TRINTELLIX ) 20 MG TABS tablet Take 1 tablet (20 mg total) by mouth daily. 90 tablet 0   No current facility-administered medications for this visit.    Medication Side Effects: Other: tremor  stable.  No worse.  Allergies:  Allergies  Allergen Reactions   Propranolol Other (See Comments)    Low blood pressure. Bradycardia    Propranolol Hcl Other (See Comments)  Brexpiprazole Other (See Comments)    Aphasia and catatonia    Past Medical History:  Diagnosis Date   Anxiety    Arthritis    hips, spine (03/17/2018)   Bipolar II disorder (HCC)    CHF (congestive heart failure) (HCC)    Chronic bronchitis (HCC)    Chronic lower back pain    Chronic right hip pain    CKD (chronic kidney disease), stage II    Coronary artery disease    stent x1   Esophagitis, erosive    GAD (generalized anxiety disorder)    GERD (gastroesophageal reflux disease)    Headache    maybe monthly (03/17/2018))   Heart murmur, systolic    History of adenomatous polyp of colon    08-04-2016  tubular adenoma   History of blood transfusion 12/2017   related to vascular hematoma   History of electroconvulsive therapy    at [facility]--  started 04-15-2015 to 11-17-2016  total greater than 40 times   History of hiatal hernia    Hyperlipidemia    Hypertension    Hypothyroidism    Internal carotid artery stenosis, bilateral    per last duplex 05-01-2014  bilateral ICA 40-59%   Major depression, chronic    ECT treatments extensive and multiple started 07/ 2016   Memory loss    both short and long-term; needs frequent reminders to follow instrucitons  (05/16/2017)   Migraines    none in years (03/17/2018)   OSA (obstructive sleep apnea)    per study 06/ 2012 moderate OSA  ; refuses to wear masks (03/17/2018)   Osteoporosis    Pneumonia 07/29/2022   Poor historian    due to short term memory loss   Presence of permanent cardiac pacemaker 03/17/2018   Pulmonary nodule    monitored by pcp   S/P placement of cardiac pacemaker 03/17/18 [facility]  03/18/2018   Short-term memory loss    Sick sinus syndrome (HCC)     Family History  Problem Relation Age of Onset   Heart attack Father 34       deceased   Hypertension Father    Heart disease Father    Dementia Brother    Parkinson's disease Brother    Heart disease Brother    Breast cancer Paternal Aunt        Age 72's   Breast cancer Paternal Grandmother        Age unknown   Colon cancer Neg Hx     Social History   Socioeconomic History   Marital status: Married    Spouse name: Dr. Sheppard Oliver   Number of children: 2   Years of education: 15   Highest education level: Not on file  Occupational History   Occupation: housewife    Employer: UNEMPLOYED  Tobacco Use   Smoking status: Former    Current packs/day: 0.00    Average packs/day: 2.0 packs/day for 15.0 years (30.0 ttl pk-yrs)    Types: Cigarettes    Start date: 01/13/1956    Quit date: 01/13/1971    Years since quitting: 52.7    Passive exposure: Never   Smokeless tobacco: Never  Vaping Use   Vaping status: Never Used  Substance and Sexual Activity   Alcohol use: Yes    Comment: occassional 1 x a week   Drug use: Never   Sexual activity: Not Currently    Comment: intercourse age 37, sexual partners less than 5  Other Topics Concern  Not on file  Social History Narrative   Right handed   Drinks caffeine  no   Single home at [employer]   Social Drivers of Health   Financial Resource Strain: Low Risk  (12/12/2022)   Received from [facility], [facility]   Overall  Financial Resource Strain (CARDIA)    Difficulty of Paying Living Expenses: Not hard at all  Food Insecurity: No Food Insecurity (12/12/2022)   Received from [facility], [facility]   Hunger Vital Sign    Worried About Running Out of Food in the Last Year: Never true    Ran Out of Food in the Last Year: Never true  Transportation Needs: No Transportation Needs (12/12/2022)   Received from [facility], [facility]   [facility] - Transportation    In the past 12 months, has lack of transportation kept you from medical appointments or from getting medications?: No    Lack of Transportation (Non-Medical): No  Physical Activity: Not on file  Stress: Not on file  Social Connections: Not on file  Intimate Partner Violence: Not At Risk (07/06/2022)   Humiliation, Afraid, Rape, and Kick questionnaire    Fear of Current or Ex-Partner: No    Emotionally Abused: No    Physically Abused: No    Sexually Abused: No    Past Medical History, Surgical history, Social history, and Family history were reviewed and updated as appropriate.   Please see review of systems for further details on the patient's review from today.   Objective:   Physical Exam:  LMP  (LMP Unknown)   Physical Exam Constitutional:      General: She is not in acute distress.    Appearance: She is well-developed.  Musculoskeletal:        General: No deformity.  Neurological:     Mental Status: She is alert and oriented to person, place, and time.     Motor: No tremor.     Coordination: Coordination abnormal.     Comments: Cane use  Psychiatric:        Attention and Perception: She is attentive.        Mood and Affect: Mood is anxious. Mood is not depressed. Affect is not labile, blunt or inappropriate.        Speech: Speech is not slurred.        Behavior: Behavior normal. Behavior is not slowed, withdrawn or combative.        Thought Content: Thought  content is not delusional. Thought content does not include homicidal or suicidal ideation. Thought content does not include suicidal plan.        Cognition and Memory: Cognition is impaired. She exhibits impaired recent memory.        Judgment: Judgment normal.     Comments: Insight fair affected by STM px. No auditory or visual hallucinations.  Depression resolved.  anxiety remarkably improved Delusional guilt not elicited Affect pleasant and smiling . Minimal spontaneous speech     Lab Review:     Component Value Date/Time   NA 139 06/27/2023 1242   NA 137 12/29/2022 0000   K 3.8 06/27/2023 1242   CL 102 06/27/2023 1242   CO2 22 06/27/2023 1242   GLUCOSE 63 (L) 06/27/2023 1242   BUN 20 06/27/2023 1242   BUN 12 12/29/2022 0000   CREATININE 0.77 06/27/2023 1242   CALCIUM  9.5 06/27/2023 1242   PROT 6.8 06/27/2023 1242  PROT 6.2 04/20/2017 1151   ALBUMIN 3.9 09/02/2022 1221   ALBUMIN 3.9 04/20/2017 1151   AST 18 06/27/2023 1242   ALT 17 06/27/2023 1242   ALKPHOS 64 09/02/2022 1221   BILITOT 0.4 06/27/2023 1242   BILITOT <0.2 04/20/2017 1151   GFRNONAA >60 07/08/2022 0429   GFRNONAA 50 (L) 07/10/2020 1021   GFRAA 58 (L) 07/10/2020 1021       Component Value Date/Time   WBC 7.0 06/27/2023 1242   RBC 4.13 06/27/2023 1242   HGB 12.0 06/27/2023 1242   HGB 10.2 (L) 04/20/2017 1151   HCT 37.7 06/27/2023 1242   HCT 29.6 (L) 04/20/2017 1151   PLT 241 06/27/2023 1242   PLT 385 (Meghan Oliver) 04/20/2017 1151   MCV 91.3 06/27/2023 1242   MCV 89 04/20/2017 1151   MCH 29.1 06/27/2023 1242   MCHC 31.8 (L) 06/27/2023 1242   RDW 14.5 06/27/2023 1242   RDW 13.9 04/20/2017 1151   LYMPHSABS 2,310 06/27/2023 1242   LYMPHSABS 1.5 04/20/2017 1151   MONOABS 0.7 07/07/2022 0458   EOSABS 91 06/27/2023 1242   EOSABS 0.3 04/20/2017 1151   BASOSABS 42 06/27/2023 1242   BASOSABS 0.0 04/20/2017 1151    Lithium  Lvl  Date Value Ref Range Status  10/21/2022 0.5 (L) 0.6 - 1.2 mmol/L Final   10/21/2022 lithium  level 0.5 on 300 mg nightly is stable.,  Nortriptyline  115 on 75 mg HS.  08/02/22 nortriptyline  85, lihtium 0.5  Nortriptyline  level 51 on 50 mg nightly with paroxetine  20 mg daily  07/10/2020 lithium  level 0.6 at [facility] office and nortriptyline  level 130 on the current dosages of lithium  300 mg nightly and nortriptyline  75 mg nightly  02/20/2021 labs lithium  0.6 on 300 mg nightly.  Nortriptyline  level 49 which is lower than expected on 75 mg nightly along with paroxetine  20 mg daily.    No results found for: PHENYTOIN, PHENOBARB, VALPROATE, CBMZ   .res Assessment: Plan:    No diagnosis found.    History of lithium  toxicity repeatedly while on chlorthalidone . No tremor px off of the lithium .    30-minute visit today with  nurse present..  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  Recent decompensation and rapidly getting worse with some cognive impairment and mild psychotic sx and altered mental status. Her case has been highly complex and treatment resistant at times.  No current catatonia but is somewhat anxious easily but only with changes.  No lorazepam  needed.SABRA  And worse with reduction of clonazepam  without improvement in cog with reduction. So  increased it back to 0.5 mg BID but balance issues and sleepiness so reducing back to 0.25 mg BID.  Then reduced to 0.25 mg HS without cog benefit or alertness or energy  and more HA so increase back to 0.25 mg BID   She had remarkedly improved with the ECT + change in psychiatric medicine to Latuda  60 mg daily with clonazepam  0.5 mg twice daily. But they would like to get her meds to the point she could stop ECT bc cog problems which are ongoing.  She is not ruminative.  Affect is bright.  Dep resolved including anhedonia largely with ECT.  Catatonia is resolved .  Cog is better with skipping a couple of weeks of ECT but is not normal with STM px and not very talkative socially  she can  recall events of the last few days..   She is still anxious and somewhat insecure.  Short-term memory impairment is  consistent with what would be expected with the ECT at this time. [facility].  options of trials of ultra-low dose lithium  Auvelity, etc. Reviewed the old chart and consider very low-dose Vraylar   Previously Disc Dr. Katina concerns about longterm ECT on cognition. Seeing neuro for work up of potential SZ episodes.  For catatonia and anxiety and HA increase clonazepam  back to 0.25 mg AM and 0.25 mg PM .  There was no benefit with less including cognition. Continue Latuda  60 mg daily Continue lorazepam  0.5 mg as needed which is currently being used rarely. continue Trintellix  to 20 mg daily off label for bipolar dep.    We discussed the short-term risks associated with benzodiazepines including sedation and increased fall risk among others.  Discussed long-term side effect risk including dependence, potential withdrawal symptoms, and the potential eventual dose-related risk of dementia.  But recent studies from 2020 dispute this association between benzodiazepines and dementia risk. Newer studies in 2020 do not support an association with dementia.  Wean Concerta  DT no sig change and risk worsening SZ   Discussed potential benefits, risks, and side effects of stimulants with patient to include increased heart rate, palpitations, insomnia, increased anxiety, increased irritability, or decreased appetite or mania, agitation.   Instructed patient and family to contact office if experiencing any significant tolerability issues.  Discussed potential metabolic side effects associated with atypical antipsychotics, as well as potential risk for movement side effects. Advised pt to contact office if movement side effects occur.   Recommend continue ECT  weekly as [facility] recommends.  She is not dep now but it tends to recur between ECT if treatments are spread out.  FU 4  weeks  Lorene Macintosh MD, DFAPA Please see After Visit Summary for patient specific instructions.  Future Appointments  Date Time Provider Department Center  10/14/2023  2:00 PM Cottle, Lorene KANDICE Raddle., MD CP-CP None  11/14/2023  1:00 PM [facility]-MR 1 [facility]  11/23/2023  2:30 PM Mannam, Praveen, MD [facility]PULCARE None  01/02/2024  7:00 AM [facility] DEVICE REMOTES [facility]  04/02/2024  7:00 AM [facility] DEVICE REMOTES [facility]  07/02/2024  7:00 AM [facility] DEVICE REMOTES [facility]  10/01/2024  7:00 AM [facility] DEVICE REMOTES [facility]  12/31/2024  7:00 AM [facility] DEVICE REMOTES [facility]  04/01/2025  7:00 AM [facility] DEVICE REMOTES [facility]  07/01/2025  7:00 AM [facility] DEVICE REMOTES [facility]  09/30/2025  7:00 AM [facility] DEVICE REMOTES [facility]      No orders of the defined types were placed in this encounter.      -------------------------------

## 2023-10-16 NOTE — Procedures (Signed)
 ELECTROENCEPHALOGRAM REPORT  Date of Study: 10/06/2023  Patient's Name: Meghan Oliver MRN: [id number] Date of Birth: October 16, 1940  Referring Provider: Camie Sevin, PA-C  Clinical History: This is an 83 year old woman with a history of worsening memory, catatonia, s/p ECT. EEG to assess for seizure activity.  CNS Active Medications: Primidone , Clonazepam , Lorazepam , Mirtazapine , Lurasidone , Trintellix , Memantine   Technical Summary: A multichannel digital 1-hour EEG recording measured by the international 10-20 system with electrodes applied with paste and impedances below 5000 ohms performed in our laboratory with EKG monitoring in an awake and asleep patient.  Hyperventilation was not performed. Photic stimulation was performed.  The digital EEG was referentially recorded, reformatted, and digitally filtered in a variety of bipolar and referential montages for optimal display.    Description: The patient is awake and asleep during the recording.  During maximal wakefulness, there is a symmetric, medium voltage 9 Hz posterior dominant rhythm that attenuates with eye opening.  There is occasional focal 4-5 Hz theta slowing over the right temporal region, at times sharply contoured. During drowsiness and sleep, there is an increase in theta slowing of the background.  Vertex waves and symmetric sleep spindles were seen. Photic stimulation did not elicit any abnormalities.  There were occasional right frontotemporal sharp waves seen. No electrographic seizures seen.    EKG lead was unremarkable.  Impression: This 1-hour awake and asleep EEG is abnormal due to the presence of: Occasional focal slowing over the right temporal region Occasional right frontotemporal epileptiform discharges  Clinical Correlation of the above findings indicates focal cerebral dysfunction over the right temporal region suggestive of underlying structural or physiologic abnormality. There is a possible tendency for  seizures to arise from this region. Clinical correlation is advised.    Darice Shivers, M.D.

## 2023-10-17 ENCOUNTER — Other Ambulatory Visit (HOSPITAL_BASED_OUTPATIENT_CLINIC_OR_DEPARTMENT_OTHER): Payer: Self-pay

## 2023-10-17 ENCOUNTER — Other Ambulatory Visit: Payer: Self-pay

## 2023-10-18 DIAGNOSIS — I2510 Atherosclerotic heart disease of native coronary artery without angina pectoris: Secondary | ICD-10-CM | POA: Diagnosis not present

## 2023-10-18 DIAGNOSIS — N189 Chronic kidney disease, unspecified: Secondary | ICD-10-CM | POA: Diagnosis not present

## 2023-10-18 DIAGNOSIS — E039 Hypothyroidism, unspecified: Secondary | ICD-10-CM | POA: Diagnosis not present

## 2023-10-18 DIAGNOSIS — F419 Anxiety disorder, unspecified: Secondary | ICD-10-CM | POA: Diagnosis not present

## 2023-10-18 DIAGNOSIS — J449 Chronic obstructive pulmonary disease, unspecified: Secondary | ICD-10-CM | POA: Diagnosis not present

## 2023-10-18 DIAGNOSIS — I5032 Chronic diastolic (congestive) heart failure: Secondary | ICD-10-CM | POA: Diagnosis not present

## 2023-10-18 DIAGNOSIS — F332 Major depressive disorder, recurrent severe without psychotic features: Secondary | ICD-10-CM | POA: Diagnosis not present

## 2023-10-18 DIAGNOSIS — G4730 Sleep apnea, unspecified: Secondary | ICD-10-CM | POA: Diagnosis not present

## 2023-10-19 DIAGNOSIS — Z1231 Encounter for screening mammogram for malignant neoplasm of breast: Secondary | ICD-10-CM | POA: Diagnosis not present

## 2023-10-19 LAB — HM MAMMOGRAPHY

## 2023-10-20 ENCOUNTER — Encounter: Payer: Self-pay | Admitting: Internal Medicine

## 2023-10-21 ENCOUNTER — Other Ambulatory Visit (HOSPITAL_BASED_OUTPATIENT_CLINIC_OR_DEPARTMENT_OTHER): Payer: Self-pay

## 2023-10-24 ENCOUNTER — Other Ambulatory Visit (HOSPITAL_BASED_OUTPATIENT_CLINIC_OR_DEPARTMENT_OTHER): Payer: Self-pay

## 2023-10-24 ENCOUNTER — Encounter (HOSPITAL_BASED_OUTPATIENT_CLINIC_OR_DEPARTMENT_OTHER): Payer: Self-pay

## 2023-10-25 DIAGNOSIS — F332 Major depressive disorder, recurrent severe without psychotic features: Secondary | ICD-10-CM | POA: Diagnosis not present

## 2023-10-25 DIAGNOSIS — K219 Gastro-esophageal reflux disease without esophagitis: Secondary | ICD-10-CM | POA: Diagnosis not present

## 2023-10-25 DIAGNOSIS — J449 Chronic obstructive pulmonary disease, unspecified: Secondary | ICD-10-CM | POA: Diagnosis not present

## 2023-10-25 DIAGNOSIS — I2510 Atherosclerotic heart disease of native coronary artery without angina pectoris: Secondary | ICD-10-CM | POA: Diagnosis not present

## 2023-10-25 DIAGNOSIS — F419 Anxiety disorder, unspecified: Secondary | ICD-10-CM | POA: Diagnosis not present

## 2023-10-25 DIAGNOSIS — E039 Hypothyroidism, unspecified: Secondary | ICD-10-CM | POA: Diagnosis not present

## 2023-10-25 DIAGNOSIS — N189 Chronic kidney disease, unspecified: Secondary | ICD-10-CM | POA: Diagnosis not present

## 2023-10-25 DIAGNOSIS — I5033 Acute on chronic diastolic (congestive) heart failure: Secondary | ICD-10-CM | POA: Diagnosis not present

## 2023-10-25 DIAGNOSIS — G4730 Sleep apnea, unspecified: Secondary | ICD-10-CM | POA: Diagnosis not present

## 2023-10-31 ENCOUNTER — Ambulatory Visit (INDEPENDENT_AMBULATORY_CARE_PROVIDER_SITE_OTHER): Payer: Medicare Other | Admitting: Podiatry

## 2023-10-31 DIAGNOSIS — M19071 Primary osteoarthritis, right ankle and foot: Secondary | ICD-10-CM | POA: Diagnosis not present

## 2023-10-31 DIAGNOSIS — M79674 Pain in right toe(s): Secondary | ICD-10-CM | POA: Diagnosis not present

## 2023-10-31 DIAGNOSIS — M21611 Bunion of right foot: Secondary | ICD-10-CM

## 2023-10-31 NOTE — Progress Notes (Unsigned)
Chief Complaint  Patient presents with   Toe Pain    Right big toe been having pain for several months, pain is along medial side of toes, non diabetic, denies redness or swelling, pain level 10/10,    HPI: 83 y.o. female presents today for concern of pain in the right great toe joint.  She is having pain along the medial aspect of the bunion extending into the toe.  States has been present for couple months.  Shoes aggravate it.  She is currently wearing dress shoes that have a pointed toe and no support.  Family member is with her today.  Past Medical History:  Diagnosis Date   Anxiety    Arthritis    "hips, spine" (03/17/2018)   Bipolar II disorder (HCC)    CHF (congestive heart failure) (HCC)    Chronic bronchitis (HCC)    Chronic lower back pain    Chronic right hip pain    CKD (chronic kidney disease), stage II    Coronary artery disease    stent x1   Esophagitis, erosive    GAD (generalized anxiety disorder)    GERD (gastroesophageal reflux disease)    Headache    "maybe monthly" (03/17/2018))   Heart murmur, systolic    History of adenomatous polyp of colon    08-04-2016  tubular adenoma   History of blood transfusion 12/2017   "related to vascular hematoma"   History of electroconvulsive therapy    at [facility]--  started 04-15-2015 to 11-17-2016  total greater than 40 times   History of hiatal hernia    Hyperlipidemia    Hypertension    Hypothyroidism    Internal carotid artery stenosis, bilateral    per last duplex 05-01-2014  bilateral ICA 40-59%   Major depression, chronic    ECT treatments extensive and multiple started 07/ 2016   Memory loss    "both short and long-term; needs frequent reminders to follow instrucitons" (05/16/2017)   Migraines    "none in years" (03/17/2018)   OSA (obstructive sleep apnea)    per study 06/ 2012 moderate OSA  ; "refuses to wear masks" (03/17/2018)   Osteoporosis    Pneumonia 07/29/2022   Poor historian    due to short term  memory loss   Presence of permanent cardiac pacemaker 03/17/2018   Pulmonary nodule    monitored by pcp   S/P placement of cardiac pacemaker 03/17/18 [facility]  03/18/2018   Short-term memory loss    Sick sinus syndrome [facility])     Past Surgical History:  Procedure Laterality Date   APPENDECTOMY  1971   ARTERY BIOPSY Left 07/05/2022   Procedure: BIOPSY TEMPORAL ARTERY;  Surgeon: Victorino Sparrow, MD;  Location: [facility] OR;  Service: Vascular;  Laterality: Left;   AUGMENTATION MAMMAPLASTY Bilateral    CARDIOVASCULAR STRESS TEST  01-30-2015  dr Shirlee Latch   Low risk nuclear study w/ no evidence ischemia or infarction/  normal LV funciton and wall motion , 76%   COLONOSCOPY W/ BIOPSIES AND POLYPECTOMY  "multiple"   COLONOSCOPY WITH ESOPHAGOGASTRODUODENOSCOPY (EGD)  last one 08-04-2016   CORONARY ANGIOPLASTY WITH STENT PLACEMENT  05/16/2017   "LAD"   CORONARY STENT INTERVENTION N/A 05/16/2017   Procedure: CORONARY STENT INTERVENTION;  Surgeon: Kathleene Hazel, MD;  [facility] INVAS[facility] LAB;  Service: Cardiovascular;  Laterality: N/A;   ESOPHAGOGASTRODUODENOSCOPY  02-26-04   ESOPHAGOGASTRODUODENOSCOPY  "multiple"   INSERT / REPLACE / REMOVE PACEMAKER  03/17/2018   IR  THORACENTESIS ASP PLEURAL SPACE W/IMG GUIDE  07/07/2022   LEFT HEART CATH AND CORONARY ANGIOGRAPHY N/A 05/16/2017   Procedure: LEFT HEART CATH AND CORONARY ANGIOGRAPHY;  Surgeon: Laurey Morale, MD;  [facility] INVASIVE CV LAB;  Service: Cardiovascular;  Laterality: N/A;   OVARIAN CYST SURGERY  1970s   Laparotomy    PACEMAKER IMPLANT N/A 03/17/2018   Procedure: PACEMAKER IMPLANT;  Surgeon: Marinus Maw, MD;  [facility]: [facility] INVASIVE CV LAB;  Service: Cardiovascular;  Laterality: N/A;   PORT-A-CATH PLACEMENT  05/31/2016; 2018   "@ [facility]; for ECT series"; "@ [facility] also"   PORT-A-CATH REMOVAL N/A 03/11/2017   Procedure: REMOVAL PORT-A-CATH;  Surgeon: Avel Peace, MD;  Location: [facility];  Service: General;   Laterality: N/A;   PORTA CATH REMOVAL  03/17/2018   PORTA CATH REMOVAL  03/17/2018   Procedure: PORTA CATH REMOVAL;  Surgeon: Marinus Maw, MD;  Location: [facility] INVASIVE CV LAB;  Service: Cardiovascular;;   TOTAL HIP ARTHROPLASTY Right 05/18/2022   Procedure: TOTAL HIP ARTHROPLASTY ANTERIOR APPROACH;  Surgeon: Durene Romans, MD;  Location: [facility] ORS;  Service: Orthopedics;  Laterality: Right;   TRANSTHORACIC ECHOCARDIOGRAM  09/05/2013  dr Shirlee Latch   mild LVH, ef 65-70%, grade 1 diastolic dysfunction/  very mild AV stenosis with mild AR/  trivial MR and PT/ mild to moderate LAE/ mild TR/ mild pulmonary hypertension with PA peak pressure   TUBAL LIGATION      Allergies  Allergen Reactions   Propranolol Other (See Comments)    Low blood pressure. Bradycardia    Propranolol Hcl Other (See Comments)   Brexpiprazole Other (See Comments)    Aphasia and catatonia    Review of Systems  Musculoskeletal:  Positive for joint pain.      PHYSICAL EXAM:  General: The patient is alert and oriented x3 in no acute distress.  Dermatology: Skin is warm, dry and supple bilateral lower extremities. Interspaces are clear of maceration and debris.  No rashes noted.   Vascular: Palpable pedal pulses bilaterally. Capillary refill within normal limits.  No appreciable edema.  No erythema or calor.  Neurological: Light touch sensation grossly intact bilateral feet.   Musculoskeletal Exam:  There is a bony medial prominence on the dorsomedial aspect of the 1st metatarsal head of the right foot.  There is pain on palpation of the "bump" in this area.  Lateral deviation of the hallux at the MPJ level.  1st MPJ ROM is decreased.  No crepitus.  Hallux is tracking, not trackbound.    RADIOGRAPHIC EXAM right foot, 3 weightbearing views, 10/31/2023):  Increased first intermetatarsal angle = 12 degrees.  Increased hallux abductus angle.  Joint space is significantly decreased at the first MPJ.  Tibial sesamoid  position is 5.  There is also joint space narrowing at the second MPJ with contracture of the second toe at the PIPJ.  Decrease in osseous mineralization  ASSESSMENT/PLAN OF CARE: 1. Toe pain, right   2. Bunion, right   3. Arthritis of first metatarsophalangeal (MTP) joint of right foot     DG FOOT COMPLETE RIGHT  Discussed patient's condition and possible etiologies today.  Discussed conservative treatment options with patient today, including shoe modification / arch supports, off-loading, cortisone injectione, NSAID topical / oral therapy, and toe splints and shields.  Briefly discussed surgical intervention if conservative options are not successful.    A cortisone injection was administered to the right first MPJ following a sterile skin prep.  This consisted  of a mixture of 1% lidocaine plain, 0.5% Marcaine plain, and Kenalog 10 for total of 1.25 cc injected.  She tolerated this well and a Band-Aid was applied.  She was given a gel double body toe splint to help push the hallux into a more rectus position.  She can wear this when in shoe gear.  Do not recommend sleeping with this on because it may just fall off in the sheets.  Return if symptoms worsen or fail to improve.    Clerance Lav, DPM, FACFAS [facility]     [address] [place], [place] [address]                Office [phone]  Fax [phone]

## 2023-11-01 DIAGNOSIS — F419 Anxiety disorder, unspecified: Secondary | ICD-10-CM | POA: Diagnosis not present

## 2023-11-01 DIAGNOSIS — E039 Hypothyroidism, unspecified: Secondary | ICD-10-CM | POA: Diagnosis not present

## 2023-11-01 DIAGNOSIS — I2510 Atherosclerotic heart disease of native coronary artery without angina pectoris: Secondary | ICD-10-CM | POA: Diagnosis not present

## 2023-11-01 DIAGNOSIS — J449 Chronic obstructive pulmonary disease, unspecified: Secondary | ICD-10-CM | POA: Diagnosis not present

## 2023-11-01 DIAGNOSIS — K219 Gastro-esophageal reflux disease without esophagitis: Secondary | ICD-10-CM | POA: Diagnosis not present

## 2023-11-01 DIAGNOSIS — G4730 Sleep apnea, unspecified: Secondary | ICD-10-CM | POA: Diagnosis not present

## 2023-11-01 DIAGNOSIS — I5032 Chronic diastolic (congestive) heart failure: Secondary | ICD-10-CM | POA: Diagnosis not present

## 2023-11-01 DIAGNOSIS — F332 Major depressive disorder, recurrent severe without psychotic features: Secondary | ICD-10-CM | POA: Diagnosis not present

## 2023-11-02 DIAGNOSIS — H353211 Exudative age-related macular degeneration, right eye, with active choroidal neovascularization: Secondary | ICD-10-CM | POA: Diagnosis not present

## 2023-11-09 ENCOUNTER — Ambulatory Visit (INDEPENDENT_AMBULATORY_CARE_PROVIDER_SITE_OTHER): Payer: Medicare Other | Admitting: Neurology

## 2023-11-09 DIAGNOSIS — R9401 Abnormal electroencephalogram [EEG]: Secondary | ICD-10-CM

## 2023-11-09 DIAGNOSIS — R413 Other amnesia: Secondary | ICD-10-CM

## 2023-11-09 DIAGNOSIS — R404 Transient alteration of awareness: Secondary | ICD-10-CM

## 2023-11-09 NOTE — Progress Notes (Signed)
 Ambulatory EEG hooked up and running. Light flashing. Push button tested. Camera and event log explained. Batteries explained. Patient understood.

## 2023-11-10 NOTE — Progress Notes (Signed)
 AMB EEG discontinued.  Skin Breakdown:No Diary Returned: Yes

## 2023-11-14 ENCOUNTER — Ambulatory Visit (HOSPITAL_COMMUNITY): Admission: RE | Admit: 2023-11-14 | Payer: Medicare Other | Source: Ambulatory Visit

## 2023-11-14 ENCOUNTER — Encounter (HOSPITAL_BASED_OUTPATIENT_CLINIC_OR_DEPARTMENT_OTHER): Payer: Self-pay

## 2023-11-14 ENCOUNTER — Other Ambulatory Visit (HOSPITAL_COMMUNITY): Payer: Self-pay

## 2023-11-14 ENCOUNTER — Other Ambulatory Visit (HOSPITAL_BASED_OUTPATIENT_CLINIC_OR_DEPARTMENT_OTHER): Payer: Self-pay

## 2023-11-14 MED ORDER — AMOXICILLIN 500 MG PO CAPS
500.0000 mg | ORAL_CAPSULE | Freq: Three times a day (TID) | ORAL | 0 refills | Status: AC
  Filled 2023-11-14 (×2): qty 21, 7d supply, fill #0

## 2023-11-16 ENCOUNTER — Other Ambulatory Visit: Payer: Self-pay | Admitting: Gastroenterology

## 2023-11-16 ENCOUNTER — Other Ambulatory Visit: Payer: Self-pay | Admitting: Internal Medicine

## 2023-11-16 ENCOUNTER — Other Ambulatory Visit (HOSPITAL_BASED_OUTPATIENT_CLINIC_OR_DEPARTMENT_OTHER): Payer: Self-pay

## 2023-11-16 ENCOUNTER — Other Ambulatory Visit: Payer: Self-pay | Admitting: Psychiatry

## 2023-11-16 DIAGNOSIS — E039 Hypothyroidism, unspecified: Secondary | ICD-10-CM | POA: Diagnosis not present

## 2023-11-16 DIAGNOSIS — F061 Catatonic disorder due to known physiological condition: Secondary | ICD-10-CM

## 2023-11-16 DIAGNOSIS — I2510 Atherosclerotic heart disease of native coronary artery without angina pectoris: Secondary | ICD-10-CM | POA: Diagnosis not present

## 2023-11-16 DIAGNOSIS — J449 Chronic obstructive pulmonary disease, unspecified: Secondary | ICD-10-CM | POA: Diagnosis not present

## 2023-11-16 DIAGNOSIS — F411 Generalized anxiety disorder: Secondary | ICD-10-CM

## 2023-11-16 DIAGNOSIS — F419 Anxiety disorder, unspecified: Secondary | ICD-10-CM | POA: Diagnosis not present

## 2023-11-16 DIAGNOSIS — F332 Major depressive disorder, recurrent severe without psychotic features: Secondary | ICD-10-CM | POA: Diagnosis not present

## 2023-11-16 DIAGNOSIS — K219 Gastro-esophageal reflux disease without esophagitis: Secondary | ICD-10-CM | POA: Diagnosis not present

## 2023-11-16 DIAGNOSIS — G4730 Sleep apnea, unspecified: Secondary | ICD-10-CM | POA: Diagnosis not present

## 2023-11-16 DIAGNOSIS — I5032 Chronic diastolic (congestive) heart failure: Secondary | ICD-10-CM | POA: Diagnosis not present

## 2023-11-16 DIAGNOSIS — N189 Chronic kidney disease, unspecified: Secondary | ICD-10-CM | POA: Diagnosis not present

## 2023-11-17 ENCOUNTER — Telehealth: Payer: Self-pay | Admitting: Gastroenterology

## 2023-11-17 ENCOUNTER — Other Ambulatory Visit (HOSPITAL_BASED_OUTPATIENT_CLINIC_OR_DEPARTMENT_OTHER): Payer: Self-pay

## 2023-11-17 MED ORDER — LEVOTHYROXINE SODIUM 75 MCG PO TABS
75.0000 ug | ORAL_TABLET | Freq: Every day | ORAL | 2 refills | Status: AC
  Filled 2023-11-17 – 2023-12-28 (×2): qty 90, 90d supply, fill #0
  Filled 2024-04-18: qty 90, 90d supply, fill #1
  Filled 2024-06-21 – 2024-06-30 (×2): qty 90, 90d supply, fill #2
  Filled 2024-10-03: qty 30, 30d supply, fill #2
  Filled 2024-10-24 – 2024-11-07 (×4): qty 30, 30d supply, fill #3

## 2023-11-17 MED ORDER — LURASIDONE HCL 60 MG PO TABS
1.0000 | ORAL_TABLET | Freq: Every day | ORAL | 0 refills | Status: DC
Start: 2023-11-17 — End: 2024-02-13
  Filled 2023-11-17 – 2023-11-21 (×2): qty 90, 90d supply, fill #0

## 2023-11-17 MED ORDER — LINACLOTIDE 72 MCG PO CAPS
145.0000 ug | ORAL_CAPSULE | Freq: Every day | ORAL | 2 refills | Status: DC
  Filled 2023-11-17: qty 60, 30d supply, fill #0

## 2023-11-17 MED ORDER — DEXLANSOPRAZOLE 60 MG PO CPDR
60.0000 mg | DELAYED_RELEASE_CAPSULE | Freq: Every day | ORAL | 3 refills | Status: DC
  Filled 2023-11-17: qty 90, fill #0
  Filled 2023-12-01 – 2023-12-28 (×2): qty 90, 90d supply, fill #0

## 2023-11-17 MED ORDER — VORTIOXETINE HBR 20 MG PO TABS
20.0000 mg | ORAL_TABLET | Freq: Every day | ORAL | 0 refills | Status: DC
  Filled 2023-11-17 – 2023-12-28 (×2): qty 90, 90d supply, fill #0

## 2023-11-17 MED ORDER — CLONIDINE HCL 0.1 MG PO TABS
0.1000 mg | ORAL_TABLET | ORAL | 2 refills | Status: DC
  Filled 2023-11-17: qty 20, 20d supply, fill #0
  Filled 2023-12-28: qty 20, 20d supply, fill #1
  Filled 2024-02-22: qty 20, 20d supply, fill #2

## 2023-11-17 NOTE — Telephone Encounter (Signed)
Prescription re-sent to patient's pharmacy with the correct dosage.

## 2023-11-17 NOTE — Telephone Encounter (Signed)
Patient care taker [name] Card called and stated that she is needing the correct script sent over to [facility] out patient pharmacy for Linzess. Patient care taker stated that she was prescribed a Linzess for 1 a day when it should actually be 2 a day. Please advise.

## 2023-11-17 NOTE — Telephone Encounter (Signed)
Patient care taker called and stated that she would like to know when this patient should make her next follow up appointment with Dr. Lavon Paganini. Patient care taker is requesting a call back. Please advise.

## 2023-11-18 ENCOUNTER — Other Ambulatory Visit (HOSPITAL_BASED_OUTPATIENT_CLINIC_OR_DEPARTMENT_OTHER): Payer: Self-pay

## 2023-11-18 NOTE — Telephone Encounter (Signed)
If she is not having any active symptoms, please schedule next available appointment for follow-up.  Thank you

## 2023-11-18 NOTE — Telephone Encounter (Signed)
Spoke with Meghan Oliver. The patient is doing well. No current issues. Appointment scheduled for 01/09/24 at 2:30 pm.

## 2023-11-21 ENCOUNTER — Other Ambulatory Visit (HOSPITAL_BASED_OUTPATIENT_CLINIC_OR_DEPARTMENT_OTHER): Payer: Self-pay

## 2023-11-22 ENCOUNTER — Ambulatory Visit (HOSPITAL_COMMUNITY)
Admission: RE | Admit: 2023-11-22 | Discharge: 2023-11-22 | Disposition: A | Discharge status: Home / Self Care | Payer: Medicare Other | Source: Ambulatory Visit | Attending: Physician Assistant | Admitting: Physician Assistant

## 2023-11-22 DIAGNOSIS — I6782 Cerebral ischemia: Secondary | ICD-10-CM | POA: Diagnosis not present

## 2023-11-22 DIAGNOSIS — R413 Other amnesia: Secondary | ICD-10-CM | POA: Insufficient documentation

## 2023-11-22 DIAGNOSIS — G319 Degenerative disease of nervous system, unspecified: Secondary | ICD-10-CM | POA: Diagnosis not present

## 2023-11-23 ENCOUNTER — Ambulatory Visit: Payer: Medicare Other | Admitting: Pulmonary Disease

## 2023-11-29 DIAGNOSIS — J449 Chronic obstructive pulmonary disease, unspecified: Secondary | ICD-10-CM | POA: Diagnosis not present

## 2023-11-29 DIAGNOSIS — F332 Major depressive disorder, recurrent severe without psychotic features: Secondary | ICD-10-CM | POA: Diagnosis not present

## 2023-11-29 DIAGNOSIS — E039 Hypothyroidism, unspecified: Secondary | ICD-10-CM | POA: Diagnosis not present

## 2023-11-29 DIAGNOSIS — N189 Chronic kidney disease, unspecified: Secondary | ICD-10-CM | POA: Diagnosis not present

## 2023-11-29 DIAGNOSIS — I2510 Atherosclerotic heart disease of native coronary artery without angina pectoris: Secondary | ICD-10-CM | POA: Diagnosis not present

## 2023-11-29 DIAGNOSIS — F419 Anxiety disorder, unspecified: Secondary | ICD-10-CM | POA: Diagnosis not present

## 2023-11-29 DIAGNOSIS — K219 Gastro-esophageal reflux disease without esophagitis: Secondary | ICD-10-CM | POA: Diagnosis not present

## 2023-11-29 DIAGNOSIS — I5032 Chronic diastolic (congestive) heart failure: Secondary | ICD-10-CM | POA: Diagnosis not present

## 2023-11-29 DIAGNOSIS — G4730 Sleep apnea, unspecified: Secondary | ICD-10-CM | POA: Diagnosis not present

## 2023-12-01 ENCOUNTER — Other Ambulatory Visit (HOSPITAL_BASED_OUTPATIENT_CLINIC_OR_DEPARTMENT_OTHER): Payer: Self-pay

## 2023-12-01 ENCOUNTER — Other Ambulatory Visit: Payer: Self-pay

## 2023-12-01 MED ORDER — LINACLOTIDE 72 MCG PO CAPS
145.0000 ug | ORAL_CAPSULE | Freq: Every day | ORAL | 1 refills | Status: DC
  Filled 2023-12-01 – 2023-12-28 (×2): qty 180, 89d supply, fill #0

## 2023-12-05 ENCOUNTER — Ambulatory Visit (INDEPENDENT_AMBULATORY_CARE_PROVIDER_SITE_OTHER): Payer: Medicare Other | Admitting: Podiatry

## 2023-12-05 DIAGNOSIS — M19071 Primary osteoarthritis, right ankle and foot: Secondary | ICD-10-CM | POA: Diagnosis not present

## 2023-12-05 DIAGNOSIS — S90421A Blister (nonthermal), right great toe, initial encounter: Secondary | ICD-10-CM | POA: Diagnosis not present

## 2023-12-05 NOTE — Progress Notes (Unsigned)
 Chief Complaint  Patient presents with   Toe Pain    Injection did help with the initial pain in the big toe    HPI: 83 y.o. female presents today with a family member.  As she had been administered a cortisone injection to the right first MPJ at her last visit.  She does note improvement of the pain in the great toe joint at this time.  She was dispensed a gel double buddy toe splint to keep the first and second toes in a more rectus position.  The toe splint appeared to cause some rubbing between the first and second toes and there is a small blister on the lateral aspect of the right great toe today.  She has some discomfort to the area.  Past Medical History:  Diagnosis Date   Anxiety    Arthritis    "hips, spine" (03/17/2018)   Bipolar II disorder (HCC)    CHF (congestive heart failure) (HCC)    Chronic bronchitis (HCC)    Chronic lower back pain    Chronic right hip pain    CKD (chronic kidney disease), stage II    Coronary artery disease    stent x1   Esophagitis, erosive    GAD (generalized anxiety disorder)    GERD (gastroesophageal reflux disease)    Headache    "maybe monthly" (03/17/2018))   Heart murmur, systolic    History of adenomatous polyp of colon    08-04-2016  tubular adenoma   History of blood transfusion 12/2017   "related to vascular hematoma"   History of electroconvulsive therapy    at [facility]--  started 04-15-2015 to 11-17-2016  total greater than 40 times   History of hiatal hernia    Hyperlipidemia    Hypertension    Hypothyroidism    Internal carotid artery stenosis, bilateral    per last duplex 05-01-2014  bilateral ICA 40-59%   Major depression, chronic    ECT treatments extensive and multiple started 07/ 2016   Memory loss    "both short and long-term; needs frequent reminders to follow instrucitons" (05/16/2017)   Migraines    "none in years" (03/17/2018)   OSA (obstructive sleep apnea)    per study 06/ 2012 moderate OSA  ; "refuses to  wear masks" (03/17/2018)   Osteoporosis    Pneumonia 07/29/2022   Poor historian    due to short term memory loss   Presence of permanent cardiac pacemaker 03/17/2018   Pulmonary nodule    monitored by pcp   S/P placement of cardiac pacemaker 03/17/18 [facility]  03/18/2018   Short-term memory loss    Sick sinus syndrome [facility])     Past Surgical History:  Procedure Laterality Date   APPENDECTOMY  1971   ARTERY BIOPSY Left 07/05/2022   Procedure: BIOPSY TEMPORAL ARTERY;  Surgeon: Victorino Sparrow, MD;  Location: [facility] OR;  Service: Vascular;  Laterality: Left;   AUGMENTATION MAMMAPLASTY Bilateral    CARDIOVASCULAR STRESS TEST  01-30-2015  dr Shirlee Latch   Low risk nuclear study w/ no evidence ischemia or infarction/  normal LV funciton and wall motion , 76%   COLONOSCOPY W/ BIOPSIES AND POLYPECTOMY  "multiple"   COLONOSCOPY WITH ESOPHAGOGASTRODUODENOSCOPY (EGD)  last one 08-04-2016   CORONARY ANGIOPLASTY WITH STENT PLACEMENT  05/16/2017   "LAD"   CORONARY STENT INTERVENTION N/A 05/16/2017   Procedure: CORONARY STENT INTERVENTION;  Surgeon: Kathleene Hazel, MD;  Location: [facility] INVASIVE CV LAB;  Service: Cardiovascular;  Laterality: N/A;   ESOPHAGOGASTRODUODENOSCOPY  02-26-04   ESOPHAGOGASTRODUODENOSCOPY  "multiple"   INSERT / REPLACE / REMOVE PACEMAKER  03/17/2018   IR THORACENTESIS ASP PLEURAL SPACE W/IMG GUIDE  07/07/2022   LEFT HEART CATH AND CORONARY ANGIOGRAPHY N/A 05/16/2017   Procedure: LEFT HEART CATH AND CORONARY ANGIOGRAPHY;  Surgeon: Laurey Morale, MD;  Location: [facility] INVASIVE CV LAB;  Service: Cardiovascular;  Laterality: N/A;   OVARIAN CYST SURGERY  1970s   Laparotomy    PACEMAKER IMPLANT N/A 03/17/2018   Procedure: PACEMAKER IMPLANT;  Surgeon: Marinus Maw, MD;  Location: [facility] INVASIVE CV LAB;  Service: Cardiovascular;  Laterality: N/A;   PORT-A-CATH PLACEMENT  05/31/2016; 2018   "@ [facility]; for ECT series"; "@ [facility] also"   PORT-A-CATH REMOVAL N/A 03/11/2017   Procedure: REMOVAL  PORT-A-CATH;  Surgeon: Avel Peace, MD;  Location: [facility];  Service: General;  Laterality: N/A;   PORTA CATH REMOVAL  03/17/2018   PORTA CATH REMOVAL  03/17/2018   Procedure: PORTA CATH REMOVAL;  Surgeon: Marinus Maw, MD;  Location: [facility] INVASIVE CV LAB;  Service: Cardiovascular;;   TOTAL HIP ARTHROPLASTY Right 05/18/2022   Procedure: TOTAL HIP ARTHROPLASTY ANTERIOR APPROACH;  Surgeon: Durene Romans, MD;  Location: [facility] ORS;  Service: Orthopedics;  Laterality: Right;   TRANSTHORACIC ECHOCARDIOGRAM  09/05/2013  dr Shirlee Latch   mild LVH, ef 65-70%, grade 1 diastolic dysfunction/  very mild AV stenosis with mild AR/  trivial MR and PT/ mild to moderate LAE/ mild TR/ mild pulmonary hypertension with PA peak pressure   TUBAL LIGATION      Allergies  Allergen Reactions   Propranolol Other (See Comments)    Low blood pressure. Bradycardia    Propranolol Hcl Other (See Comments)   Brexpiprazole Other (See Comments)    Aphasia and catatonia   Physical Exam: Palpable pedal pulses right foot.  No pain on palpation to the dorsal aspect of the first MPJ.  The hallux is 70% reducible with manipulation from its hallux valgus position.  There is a small 0.5 cm diameter superficial blood blister on the dorsal lateral aspect of the right hallux near the sulcus.  No clinical signs of infection are noted.  There is some discomfort on palpation of the blister.  There is a small area of irritation to the skin on the medial aspect of the second toe adjacent to this location on the hallux.  Assessment/Plan of Care: 1. Blister (nonthermal), right great toe, initial encounter   2. Arthritis of first metatarsophalangeal (MTP) joint of right foot    Discussed clinical findings with patient today.  Will avoid draining the blister today.  Will have them apply iodine or Betadine solution to the blister and wrapped with a thin layer of gauze.  This will be changed daily until the blister is  resorbed into the skin.  If it does not, they can follow-up for reevaluation.    Will discontinue the double buddy toe splint and she was shown a very thin bunion shield which should not cause increased irritation between the toes.  We discussed the patient's shoe gear because her current shoes today show a bulge where the bunion is pushing against the shoe.  Due to the material of the shoe, this cannot be spot stretched today as it will most likely tear through the shoe.  Follow-up as needed   Clerance Lav, DPM, FACFAS [facility]     [address]  [place], [place] [address]                Office [phone]  Fax [phone]

## 2023-12-07 ENCOUNTER — Ambulatory Visit (INDEPENDENT_AMBULATORY_CARE_PROVIDER_SITE_OTHER): Payer: Medicare Other | Admitting: Neurology

## 2023-12-07 ENCOUNTER — Encounter: Payer: Self-pay | Admitting: Neurology

## 2023-12-07 VITALS — BP 130/72 | HR 69 | Ht 59.0 in | Wt 98.2 lb

## 2023-12-07 DIAGNOSIS — R9401 Abnormal electroencephalogram [EEG]: Secondary | ICD-10-CM

## 2023-12-07 DIAGNOSIS — R413 Other amnesia: Secondary | ICD-10-CM | POA: Diagnosis not present

## 2023-12-07 NOTE — Progress Notes (Signed)
 NEUROLOGY CONSULTATION NOTE  [name] MRN: [id number] DOB: 1941/04/21  Referring provider: Marlowe Kays, PA-C Primary care provider: Dr. Marlan Palau  Reason for consult:  abnormal EEG   Thank you for your kind referral of Meghan Oliver for consultation of the above symptoms. Although her history is well known to you, please allow me to reiterate it for the purpose of our medical record. The patient was accompanied to the clinic by her husband and long-time caregiver/nurse [name] who also provide collateral information. Records and images were personally reviewed where available.  HISTORY OF PRESENT ILLNESS: Meghan Oliver is an 83 year old right-handed woman with multiple medical issues, seen by our Memory Disorders PA Marlowe Kays 2 months ago for worsening cognitive changes. They present today to discuss her 24-hour EEG results. Records and images available were reviewed. She had Neuropsychological testing in January 2019 with a diagnosis of moderate dementia of unspecified etiology. On initial visit with [name] in 09/2023, her MoCA score was 12/30. As part of her evaluation, she had a brain MRI without contrast 11/2023 which I personally reviewed, no acute changes, there was mild diffuse atrophy, multifocal FLAIR signal changes in the cerebral white matter, pons, left thalamus, consistent with mild chronic microvascular disease. Her 1-hour EEG in 10/2023 showed occasional right temporal focal slowing and occasional right frontotemporal epileptiform discharges. She had a 24-hour ambulatory EEG in February which showed occasional bifrontal spikes, maximal over the right frontopolar region, independent sharp waves over the bilateral temporal regions with triphasic morphology. No electrographic seizures seen.   She is accompanied by her husband Dr. Corinda Gubler and her nurse [name], who has worked with her for the past 20 years. She reports that memory has progressively worsened over several years, she  was started on Memantine over 10 years ago. Initially, they noticed more difficulties with long-term memory. She has been treated with ECT for several years and when memory worsened, ECT treatments were stopped for 3 years with no change in memory. Her short-term memory is now much worse than before, she would forget conversations from a few minutes ago. She is having a good day today, [name] reports that she usually has a very, very flat affect, but today she is able to answer and ask questions, smiling. Usually it is just silence. [name] states "this is as bright and engaging as she has been." She is in great shape today, "80% as good as we've gotten in the past year."   On review of records, she has a history of catatonia. [name] reports that 10 years ago, she had an episode where she was sitting on the bedroom floor, naked, upset, speaking gibberish and not responding to them. This lasted for days, she was admitted at [facility] for 36 hours with no improvement, they brought her to [facility] where she had ECT. [name] notes that ECT with medication management has been most effective for her. In January 2024, she started having worsening depression and anxiety where she was barely eating, withdrawn, with delusions. [name] shows a video of how she was doing in 12/2022 before being brought to [facility] for ECT. She is lying in bed, restless, tremulous with eyes closed, saying she is afraid. [name] reports hallucinations, agitation, very little engagement. Several medications, including Ketamine were tried. She was having minimal sleep with significant anxiety. She was admitted to [facility] 12/10/22 and underwent ECT. She was initially getting ECT 2-3 times a week with 50% improvement, then now gets ECT every 2  weeks, last session was 10/29/23. A few days before her next ECT treatment, she would have less engagement going 4-5 hours with no speech, answering 2-3 words but appropriate. She would be less engaged for around 9 days after ECT  treatment then they start seeing improvement. Sleep is usually okay. She has tried multiple psychotropic medications in the past, she is now off Lithium. [name] recalls she has tried Lamictal and Neurontin in the past.   She states she does not have headaches frequently, but Dr. Corinda Gubler and [name] report she complains of headaches most mornings. She would have a headache after ECT, and [name] has noticed they are often anxiety-induced as well. She reports pressure over the frontal region with no nausea/vomiting. She has always been hypersensitive to light. She asks for Tylenol, usually on a daily basis, they give her one a day. She denies any dizziness, vision changes, neck/back pain, focal numbness/tingling/weakness, urinary incontinence. She has chronic constipation and takes Linzess for gastroparesis. She has tremors that are worse depending on her depression. She is on Primidone which seems to help. She denies any olfactory/gustatory hallucinations, rising epigastric sensation, myoclonic jerks. She takes lorazepam 0.25mg  as needed, around twice a month when talking about something that is very stressful or anxiety-provoking. She had a normal birth and early development.  There is no history of febrile convulsions, CNS infections such as meningitis/encephalitis, significant traumatic brain injury, neurosurgical procedures, or family history of seizures.   PAST MEDICAL HISTORY: Past Medical History:  Diagnosis Date   Anxiety    Arthritis    "hips, spine" (03/17/2018)   Bipolar II disorder (HCC)    CHF (congestive heart failure) (HCC)    Chronic bronchitis (HCC)    Chronic lower back pain    Chronic right hip pain    CKD (chronic kidney disease), stage II    Coronary artery disease    stent x1   Esophagitis, erosive    GAD (generalized anxiety disorder)    GERD (gastroesophageal reflux disease)    Headache    "maybe monthly" (03/17/2018))   Heart murmur, systolic    History of adenomatous polyp of  colon    08-04-2016  tubular adenoma   History of blood transfusion 12/2017   "related to vascular hematoma"   History of electroconvulsive therapy    at [facility]--  started 04-15-2015 to 11-17-2016  total greater than 40 times   History of hiatal hernia    Hyperlipidemia    Hypertension    Hypothyroidism    Internal carotid artery stenosis, bilateral    per last duplex 05-01-2014  bilateral ICA 40-59%   Major depression, chronic    ECT treatments extensive and multiple started 07/ 2016   Memory loss    "both short and long-term; needs frequent reminders to follow instrucitons" (05/16/2017)   Migraines    "none in years" (03/17/2018)   OSA (obstructive sleep apnea)    per study 06/ 2012 moderate OSA  ; "refuses to wear masks" (03/17/2018)   Osteoporosis    Pneumonia 07/29/2022   Poor historian    due to short term memory loss   Presence of permanent cardiac pacemaker 03/17/2018   Pulmonary nodule    monitored by pcp   S/P placement of cardiac pacemaker 03/17/18 [facility]  03/18/2018   Short-term memory loss    Sick sinus syndrome (HCC)     PAST SURGICAL HISTORY: Past Surgical History:  Procedure Laterality Date   APPENDECTOMY  1971   ARTERY BIOPSY  Left 07/05/2022   Procedure: BIOPSY TEMPORAL ARTERY;  Surgeon: Victorino Sparrow, MD;  Location: [facility] OR;  Service: Vascular;  Laterality: Left;   AUGMENTATION MAMMAPLASTY Bilateral    CARDIOVASCULAR STRESS TEST  01-30-2015  dr Shirlee Latch   Low risk nuclear study w/ no evidence ischemia or infarction/  normal LV funciton and wall motion , 76%   COLONOSCOPY W/ BIOPSIES AND POLYPECTOMY  "multiple"   COLONOSCOPY WITH ESOPHAGOGASTRODUODENOSCOPY (EGD)  last one 08-04-2016   CORONARY ANGIOPLASTY WITH STENT PLACEMENT  05/16/2017   "LAD"   CORONARY STENT INTERVENTION N/A 05/16/2017   Procedure: CORONARY STENT INTERVENTION;  Surgeon: Kathleene Hazel, MD;  Location: [facility] INVASIVE CV LAB;  Service: Cardiovascular;  Laterality: N/A;    ESOPHAGOGASTRODUODENOSCOPY  02-26-04   ESOPHAGOGASTRODUODENOSCOPY  "multiple"   INSERT / REPLACE / REMOVE PACEMAKER  03/17/2018   IR THORACENTESIS ASP PLEURAL SPACE W/IMG GUIDE  07/07/2022   LEFT HEART CATH AND CORONARY ANGIOGRAPHY N/A 05/16/2017   Procedure: LEFT HEART CATH AND CORONARY ANGIOGRAPHY;  Surgeon: Laurey Morale, MD;  Location: [facility] INVASIVE CV LAB;  Service: Cardiovascular;  Laterality: N/A;   OVARIAN CYST SURGERY  1970s   Laparotomy    PACEMAKER IMPLANT N/A 03/17/2018   Procedure: PACEMAKER IMPLANT;  Surgeon: Marinus Maw, MD;  Location: [facility] INVASIVE CV LAB;  Service: Cardiovascular;  Laterality: N/A;   PORT-A-CATH PLACEMENT  05/31/2016; 2018   "@ [facility]; for ECT series"; "@ [facility] also"   PORT-A-CATH REMOVAL N/A 03/11/2017   Procedure: REMOVAL PORT-A-CATH;  Surgeon: Avel Peace, MD;  Location: [facility];  Service: General;  Laterality: N/A;   PORTA CATH REMOVAL  03/17/2018   PORTA CATH REMOVAL  03/17/2018   Procedure: PORTA CATH REMOVAL;  Surgeon: Marinus Maw, MD;  Location: [facility] INVASIVE CV LAB;  Service: Cardiovascular;;   TOTAL HIP ARTHROPLASTY Right 05/18/2022   Procedure: TOTAL HIP ARTHROPLASTY ANTERIOR APPROACH;  Surgeon: Durene Romans, MD;  Location: [facility] ORS;  Service: Orthopedics;  Laterality: Right;   TRANSTHORACIC ECHOCARDIOGRAM  09/05/2013  dr Shirlee Latch   mild LVH, ef 65-70%, grade 1 diastolic dysfunction/  very mild AV stenosis with mild AR/  trivial MR and PT/ mild to moderate LAE/ mild TR/ mild pulmonary hypertension with PA peak pressure   TUBAL LIGATION      MEDICATIONS: Current Outpatient Medications on File Prior to Visit  Medication Sig Dispense Refill   amLODipine (NORVASC) 10 MG tablet Take 1 tablet (10 mg total) by mouth daily. 90 tablet 1   aspirin 81 MG chewable tablet Chew 1 tablet (81 mg total) by mouth daily.     Calcium Carbonate-Vitamin D (CALCIUM 600/VITAMIN D PO) Take 1 tablet by mouth 2 (two) times daily.     cetirizine  (ZYRTEC) 10 MG tablet Take 10 mg by mouth as needed for allergies.     Cholecalciferol (VITAMIN D3) 50 MCG (2000 UT) TABS Take 2,000 Units by mouth in the morning.     clonazePAM (KLONOPIN) 0.5 MG tablet Take 1 tablet (0.5 mg total) by mouth 2 (two) times daily. 180 tablet 0   cloNIDine (CATAPRES) 0.1 MG tablet Take 1 tablet (0.1 mg total) by mouth as needed prior to ECT treatment 20 tablet 2   dexlansoprazole (DEXILANT) 60 MG capsule Take 1 capsule (60 mg total) by mouth daily. 90 capsule 3   glycerin adult 2 g suppository Use 1 suppository as needed if you have not had a bowel movement in  4 to 5 days 25 suppository  0   hydrochlorothiazide (HYDRODIURIL) 12.5 MG tablet Take 1 tablet (12.5 mg total) by mouth daily. 90 tablet 1   Krill Oil 500 MG CAPS Take 500 mg by mouth daily.     lactulose (CHRONULAC) 10 GM/15ML solution Take 30 mLs (20 g total) by mouth 3 (three) times daily between meals as needed for mild constipation. (Patient taking differently: Take 20 g by mouth daily as needed for mild constipation.) 473 mL 3   Lavender Oil 80 MG CAPS Take 160 mg by mouth at bedtime.     levothyroxine (SYNTHROID) 75 MCG tablet Take 1 tablet (75 mcg total) by mouth daily. 90 tablet 2   linaclotide (LINZESS) 72 MCG capsule Take 2 capsules (145 mcg total) by mouth daily before breakfast. 180 capsule 1   LORazepam (ATIVAN) 0.5 MG tablet TAKE 2 TABLETS (1 MG)  BY MOUTH EVERY NIGHT AT BEDTIME AND 1 TABLET EVERY 4 HOURS AS NEEDED FOR ANXIETY 270 tablet 0   losartan (COZAAR) 100 MG tablet Take 1 tablet (100 mg total) by mouth daily. 90 tablet 1   Lurasidone HCl 60 MG TABS Take 1 tablet (60 mg total) by mouth daily after supper. 90 tablet 0   magic mouthwash w/lidocaine SOLN Take 5 mLs by mouth 3 (three) times daily as needed for mouth pain. 120 mL 0   memantine (NAMENDA) 10 MG tablet Take 1 tablet (10 mg total) by mouth 2 (two) times daily. 180 tablet 1   metoprolol tartrate (LOPRESSOR) 25 MG tablet Take 1 tablet  (25 mg total) by mouth as needed. 45 tablet 3   mirtazapine (REMERON) 7.5 MG tablet Take 1 tablet (7.5 mg total) by mouth at bedtime. 90 tablet 0   Multiple Vitamins-Minerals (MULTIVITAMIN WITH MINERALS) tablet Take 1 tablet by mouth daily.     Multiple Vitamins-Minerals (PRESERVISION AREDS 2 PO) Take 1 capsule by mouth in the morning and at bedtime.     neomycin-polymyxin b-dexamethasone (MAXITROL) 3.5-10000-0.1 OINT Apply thin line (1 cm) of ointment to right eye twice daily for 1 week. (Patient taking differently: as needed.) 3.5 g 1   ofloxacin (OCUFLOX) 0.3 % ophthalmic solution Instill 1 drop into right eye four times daily for 7 days. 5 mL 1   omeprazole-sodium bicarbonate (ZEGERID) 40-1100 MG capsule Take 1 capsule by mouth daily before breakfast. (Patient taking differently: Take 1 capsule by mouth as needed.) 90 capsule 3   ondansetron (ZOFRAN-ODT) 4 MG disintegrating tablet Place 1 tablet every 6 hours by translingual route as needed. 20 tablet 0   polyethylene glycol (MIRALAX / GLYCOLAX) 17 g packet Take 17 g by mouth as needed for mild constipation.     primidone (MYSOLINE) 50 MG tablet Take 2 tablets (100 mg total) by mouth every morning AND 1 tablet (50 mg total) at bedtime. 270 tablet 0   sennosides-docusate sodium (SENOKOT-S) 8.6-50 MG tablet Take 2 tablets by mouth as needed.     thiamine (VITAMIN B-1) 100 MG tablet Take 1 tablet (100 mg total) by mouth daily. 100 tablet 3   valACYclovir (VALTREX) 1000 MG tablet Take 2 tablets (2,000 mg total) by mouth at onset, then take 2 tablets 12 hours later. 30 tablet 1   vitamin B-12 (CYANOCOBALAMIN) 1000 MCG tablet Take 1,000 mcg by mouth at bedtime.     vortioxetine HBr (TRINTELLIX) 20 MG TABS tablet Take 1 tablet (20 mg total) by mouth daily. 90 tablet 0   levothyroxine (SYNTHROID) 75 MCG tablet Take 1 tablet (75 mcg total) by  mouth daily. 90 tablet 2   levothyroxine (SYNTHROID) 75 MCG tablet Take 1 tablet (75 mcg total) by mouth daily. 90  tablet 2   No current facility-administered medications on file prior to visit.    ALLERGIES: Allergies  Allergen Reactions   Propranolol Other (See Comments)    Low blood pressure. Bradycardia    Propranolol Hcl Other (See Comments)   Brexpiprazole Other (See Comments)    Aphasia and catatonia    FAMILY HISTORY: Family History  Problem Relation Age of Onset   Heart attack Father 50       deceased   Hypertension Father    Heart disease Father    Dementia Brother    Parkinson's disease Brother    Heart disease Brother    Breast cancer Paternal Aunt        Age 73's   Breast cancer Paternal Grandmother        Age unknown   Colon cancer Neg Hx     SOCIAL HISTORY: Social History   Socioeconomic History   Marital status: Married    Spouse name: Dr. Victorino Dike   Number of children: 2   Years of education: 15   Highest education level: Not on file  Occupational History   Occupation: housewife    Employer: UNEMPLOYED  Tobacco Use   Smoking status: Former    Current packs/day: 0.00    Average packs/day: 2.0 packs/day for 15.0 years (30.0 ttl pk-yrs)    Types: Cigarettes    Start date: 01/13/1956    Quit date: 01/13/1971    Years since quitting: 52.9    Passive exposure: Never   Smokeless tobacco: Never  Vaping Use   Vaping status: Never Used  Substance and Sexual Activity   Alcohol use: Yes    Comment: occassional 1 x a week   Drug use: Never   Sexual activity: Not Currently    Comment: intercourse age 45, sexual partners less than 5  Other Topics Concern   Not on file  Social History Narrative   Right handed   Drinks caffeine no   Single home at [employer]   Social Drivers of Health   Financial Resource Strain: Low Risk  (12/12/2022)   Received from [facility], [facility]   Overall Financial Resource Strain (CARDIA)    Difficulty of Paying Living Expenses: Not hard at all  Food Insecurity: No Food Insecurity  (12/12/2022)   Received from [facility], [facility]   Hunger Vital Sign    Worried About Running Out of Food in the Last Year: Never true    Ran Out of Food in the Last Year: Never true  Transportation Needs: No Transportation Needs (12/12/2022)   Received from [facility], [facility]   [facility] - Transportation    In the past 12 months, has lack of transportation kept you from medical appointments or from getting medications?: No    Lack of Transportation (Non-Medical): No  Physical Activity: Not on file  Stress: Not on file  Social Connections: Not on file  Intimate Partner Violence: Not At Risk (07/06/2022)   Humiliation, Afraid, Rape, and Kick questionnaire    Fear of Current or Ex-Partner: No    Emotionally Abused: No    Physically Abused: No    Sexually Abused: No     PHYSICAL EXAM: Vitals:   12/07/23 1256  BP: 130/72  Pulse: 69  SpO2: 96%  General: No acute distress Head:  Normocephalic/atraumatic Skin/Extremities: No rash, no edema Neurological Exam: Mental status: alert and oriented to person, place, and time, no dysarthria or aphasia, Fund of knowledge is appropriate.  Recent and remote memory are intact.  Attention and concentration are normal.    Able to name objects and repeat phrases. MMSE 28/30    12/07/2023    2:00 PM  MMSE - Mini Mental State Exam  Orientation to time 4  Orientation to Place 5  Registration 3  Attention/ Calculation 5  Recall 2  Language- name 2 objects 2  Language- repeat 1  Language- follow 3 step command 3  Language- read & follow direction 1  Write a sentence 1  Copy design 1  Total score 28   Cranial nerves: CN I: not tested CN II: pupils equal, round, visual fields intact CN III, IV, VI:  full range of motion, no nystagmus, no ptosis CN V: facial sensation intact CN VII: upper and lower face symmetric CN VIII: hearing intact to conversation Bulk &  Tone: +cogwheeling bilaterally Motor: 5/5 throughout with no pronator drift. Sensation: intact to light touch, cold, pin, vibration sense.  No extinction to double simultaneous stimulation.  Romberg test neagtive Deep Tendon Reflexes: +2 throughout Cerebellar: no incoordination on finger to nose testing Gait: narrow-based with fair arm swing, no shuffling gait Tremor: no resting tremor, +mild postural tremor bilaterally +postural instability   IMPRESSION: Meghan Oliver is an 83 year old right-handed woman with multiple medical issues, seen by our Memory Disorders PA Marlowe Kays 2 months ago for worsening cognitive changes. As part of her evaluation, she had a brain MRI and EEG. Brain MRI no acute changes, there is mild chronic microvascular disease. Her routine and 24-hour EEG were abnormal with spikes in the bifrontal regions (maximal at right frontopolar), bilateral temporal sharp waves with triphasic morphology. She has not had any convulsive seizures, there have been periods of catatonia lasting for several days which would be unusual for seizure, however post-ictal psychosis is a consideration. I have not found any reports about ECT causing epilepsy. I will speak to her psychiatrist about her case. She does report frequent headaches, we can consider Depakote for seizure and headache prophylaxis if this is appropriate for her. They were advised to minimize headache rescue medication to 2-3 a week to avoid rebound headaches. She is in a much better state today, MMSE 28/30 which is a significant improvement compared to prior visits/testing. Continue close supervision. Follow-up in 3 months, call for any changes.   The duration of this appointment visit was 63 minutes of face-to-face time with the patient.  Greater than 50% of this time was spent in counseling, explanation of diagnosis, planning of further management, and coordination of care.  Thank you for allowing me to participate in the care of  this patient. Please do not hesitate to call for any questions or concerns.   Patrcia Dolly, M.D.  CC: Dr. Lenord Fellers, Dr. Sabino Gasser (fax: [phone])

## 2023-12-07 NOTE — Patient Instructions (Addendum)
 Good to meet you. I will speak to Dr. Sabino Gasser and we will decide on next steps.   Continue all your medications.   Minimize any over the counter headache medication to 3 times a week to avoid rebound headaches.  Follow-up in 3 months, call for any changes.

## 2023-12-12 ENCOUNTER — Other Ambulatory Visit (HOSPITAL_BASED_OUTPATIENT_CLINIC_OR_DEPARTMENT_OTHER): Payer: Self-pay

## 2023-12-13 DIAGNOSIS — G4730 Sleep apnea, unspecified: Secondary | ICD-10-CM | POA: Diagnosis not present

## 2023-12-13 DIAGNOSIS — I5032 Chronic diastolic (congestive) heart failure: Secondary | ICD-10-CM | POA: Diagnosis not present

## 2023-12-13 DIAGNOSIS — F419 Anxiety disorder, unspecified: Secondary | ICD-10-CM | POA: Diagnosis not present

## 2023-12-13 DIAGNOSIS — E039 Hypothyroidism, unspecified: Secondary | ICD-10-CM | POA: Diagnosis not present

## 2023-12-13 DIAGNOSIS — I2510 Atherosclerotic heart disease of native coronary artery without angina pectoris: Secondary | ICD-10-CM | POA: Diagnosis not present

## 2023-12-13 DIAGNOSIS — N189 Chronic kidney disease, unspecified: Secondary | ICD-10-CM | POA: Diagnosis not present

## 2023-12-13 DIAGNOSIS — K219 Gastro-esophageal reflux disease without esophagitis: Secondary | ICD-10-CM | POA: Diagnosis not present

## 2023-12-13 DIAGNOSIS — J449 Chronic obstructive pulmonary disease, unspecified: Secondary | ICD-10-CM | POA: Diagnosis not present

## 2023-12-13 DIAGNOSIS — F332 Major depressive disorder, recurrent severe without psychotic features: Secondary | ICD-10-CM | POA: Diagnosis not present

## 2023-12-15 ENCOUNTER — Ambulatory Visit: Payer: Medicare Other | Admitting: Psychiatry

## 2023-12-20 ENCOUNTER — Telehealth: Payer: Self-pay | Admitting: Neurology

## 2023-12-20 NOTE — Telephone Encounter (Signed)
 Spoke to patient's psychiatrist at [facility], Dr. Sabino Gasser about her EEG. She has not had any clinical symptoms of seizure that can be determined. We discussed treating EEG versus treating patient, and agree that since she has not had any clinical symptoms, close clinical monitoring off seizure medication will be beneficial. He reviewed her prior visits and notes that ECT has been very helpful and lifesaving for Meghan Oliver, and that starting seizure medication would affect ECT treatment, but if there is no other option, they can work around it. Agreed to monitor patient, we will look into starting a different headache prophylactic medication that is not an anti-seizure medication.

## 2023-12-27 NOTE — Addendum Note (Signed)
 Addended by: Van Clines on: 12/27/2023 09:40 AM   Modules accepted: Orders

## 2023-12-27 NOTE — Procedures (Signed)
 ELECTROENCEPHALOGRAM REPORT  Dates of Recording: 11/09/2023 10:43AM to 11/10/2023 10:39AM  Patient's Name: Meghan Oliver MRN: [id number] Date of Birth: 11/06/1940  Referring Provider: Camie Sevin, PA-C  Procedure: 24-hour ambulatory video EEG  History: This is an 83 year old woman with memory loss, severe depression with catatonia, with abnormal routine EEG. Ambulatory EEG for further evaluation.  CNS Active Medications:  Primidone  Lorazepam  Clonazepam  Lurasidone  Mirtazapine  Trintellix   Technical Summary: This is a 24-hour multichannel digital video EEG recording measured by the international 10-20 system with electrodes applied with paste and impedances below 5000 ohms performed as portable with EKG monitoring.  The digital EEG was referentially recorded, reformatted, and digitally filtered in a variety of bipolar and referential montages for optimal display.    DESCRIPTION OF RECORDING: During maximal wakefulness, the background activity consisted of a symmetric 9 Hz posterior dominant rhythm which was reactive to eye opening. There is occasional focal slowing over the bilateral temporal regions, at times sharply contoured. There were no epileptiform discharges seen in wakefulness.  During the recording, the patient progresses through wakefulness, drowsiness, and Stage 2 sleep. There were occasional broad sharp waves with triphasic morphology seen independently over the bilateral temporal region. There were occasional bifrontal spikes seen maximal over the right frontopolar region.   Events: No push button events.  There were no electrographic seizures seen.  EKG lead was unremarkable.  IMPRESSION: This 24-hour ambulatory video EEG study is abnormal due to the presence of: Occasional focal slowing over the bilateral temporal regions Occasional broad sharp waves with triphasic morphology seen independently over the bilateral temporal regions exclusively in sleep Occasional bifrontal  spikes maximal over the right frontopolar region seen exclusively in sleep  CLINICAL CORRELATION of the above findings indicates focal cerebral dysfunction over the bilateral temporal regions suggestive of underlying structural or physiologic abnormality. There is a tendency for seizures to arise from the bilateral temporal and frontal regions. If further clinical questions remain, inpatient video EEG monitoring may be helpful.   Darice Shivers, M.D.

## 2023-12-28 ENCOUNTER — Other Ambulatory Visit: Payer: Self-pay

## 2023-12-28 ENCOUNTER — Other Ambulatory Visit: Payer: Self-pay | Admitting: Psychiatry

## 2023-12-28 ENCOUNTER — Other Ambulatory Visit (HOSPITAL_BASED_OUTPATIENT_CLINIC_OR_DEPARTMENT_OTHER): Payer: Self-pay

## 2023-12-28 ENCOUNTER — Other Ambulatory Visit: Payer: Self-pay | Admitting: Internal Medicine

## 2023-12-28 DIAGNOSIS — F5105 Insomnia due to other mental disorder: Secondary | ICD-10-CM

## 2023-12-28 DIAGNOSIS — F3132 Bipolar disorder, current episode depressed, moderate: Secondary | ICD-10-CM

## 2023-12-28 MED ORDER — LOSARTAN POTASSIUM 100 MG PO TABS
100.0000 mg | ORAL_TABLET | Freq: Every day | ORAL | 1 refills | Status: DC
  Filled 2023-12-28 – 2024-01-24 (×2): qty 90, 90d supply, fill #0
  Filled 2024-04-18: qty 90, 90d supply, fill #1

## 2023-12-28 MED ORDER — HYDROCHLOROTHIAZIDE 12.5 MG PO TABS
12.5000 mg | ORAL_TABLET | Freq: Every day | ORAL | 1 refills | Status: DC
Start: 2023-12-28 — End: 2024-06-21
  Filled 2023-12-28 – 2024-01-24 (×2): qty 90, 90d supply, fill #0
  Filled 2024-04-18: qty 90, 90d supply, fill #1

## 2023-12-28 MED ORDER — MIRTAZAPINE 7.5 MG PO TABS
7.5000 mg | ORAL_TABLET | Freq: Every day | ORAL | 0 refills | Status: DC
  Filled 2023-12-28: qty 90, 90d supply, fill #0

## 2023-12-28 MED ORDER — PRIMIDONE 50 MG PO TABS
ORAL_TABLET | ORAL | 0 refills | Status: DC
  Filled 2023-12-28 – 2024-01-24 (×2): qty 270, 90d supply, fill #0

## 2023-12-30 DIAGNOSIS — N189 Chronic kidney disease, unspecified: Secondary | ICD-10-CM | POA: Diagnosis not present

## 2023-12-30 DIAGNOSIS — E039 Hypothyroidism, unspecified: Secondary | ICD-10-CM | POA: Diagnosis not present

## 2023-12-30 DIAGNOSIS — K219 Gastro-esophageal reflux disease without esophagitis: Secondary | ICD-10-CM | POA: Diagnosis not present

## 2023-12-30 DIAGNOSIS — F419 Anxiety disorder, unspecified: Secondary | ICD-10-CM | POA: Diagnosis not present

## 2023-12-30 DIAGNOSIS — G4730 Sleep apnea, unspecified: Secondary | ICD-10-CM | POA: Diagnosis not present

## 2023-12-30 DIAGNOSIS — J449 Chronic obstructive pulmonary disease, unspecified: Secondary | ICD-10-CM | POA: Diagnosis not present

## 2023-12-30 DIAGNOSIS — I5032 Chronic diastolic (congestive) heart failure: Secondary | ICD-10-CM | POA: Diagnosis not present

## 2023-12-30 DIAGNOSIS — I2510 Atherosclerotic heart disease of native coronary artery without angina pectoris: Secondary | ICD-10-CM | POA: Diagnosis not present

## 2023-12-30 DIAGNOSIS — F332 Major depressive disorder, recurrent severe without psychotic features: Secondary | ICD-10-CM | POA: Diagnosis not present

## 2024-01-02 ENCOUNTER — Ambulatory Visit: Payer: Medicare Other | Admitting: Psychiatry

## 2024-01-02 NOTE — Progress Notes (Signed)
 Meghan Oliver [id number] 09/08/1941 83 y.o.  Subjective:   Patient ID:  Meghan Oliver is a 83 y.o. (DOB March 10, 1941) female. Patient was last seen August 21, 2018 Chief Complaint:  Chief Complaint  Patient presents with   Follow-up   Depression   Altered Mental Status   Anxiety   Sleeping Problem   Other    Abnormal EEG     SHRIKA MILOS presents to the office today for follow-up of Severe TR bipolar depression with psychotic features including catatonia.  11/30/2018 was the last visit and the following was noted: Had ECT yesterday at [facility].  Last was Jan 8 and was doing well then.  Next scheduled April 15.  Had a lithium level from there which is pending. Pretty good until the last 10 days to 2 weeks with less energy and motivation.  Bothered by old sick dog.  Worried about how that will affect her.  Has slept with the dog.  [place]ed [place].  Going to gym and playing [place].  Took the class and didn't feel she caught on quickly. Wonders if it is bc of the ECT memory effects.  Bridge is harder and not playing.  Planning to return to [name] mid April.   spent the winter in [place] per usual..  No significant depression since here. ECT frequency about 6 weeks.  Gets very anxious the day of the ECT.  ECT consistently for a year.  Best in mood in 7-8 years.  Also the pacemaker helped.  Friends notice the benefit.  Plan:  Option increase in the nortriptyline if needed.  Considered this.  They agree and would like to increase nortriptyline to 75 to try to reduce tendency to depression before the ECT.   03/19/19  Addendum: Here the contents from an email from the nurse for Meghan Oliver  Hello [name],   I hope you and your family are healthy and well. I took Meghan Oliver to [facility] for ECT and here are labs from 01/26/19. I am going to also forward them to [facility] for follow up with her LFT's. They are still higher than I would like.  Her lithium level was 0.51 on a daily dose of 300mg  qohs x 4  nights- alternating with 450mg  qohs x 3 nights. She is not demonstrating any signs of elevated levels, minimal hand tremors, less anxiety and restlessness since her ECT treatment.  Her Nortryptyline level was 140 on 75mg  qhs.  I would like to continue her current dosages of both the above meds and recheck her lithium level at her next scheduled ECT of 03/23/19.  Please let me know of any changes you would like to make. Take care, [name] Lithium level was 0.4 on the 450mg  alt with 300 mg QOD. No changes were made in meds.  11/15/2019 phone call: Telephone call from patient's husband [name] on 11/14/2019  Patient is scheduled for ECT soon for maintenance ECT.  He thinks it is been 2-1/2 months since the last ECT but he is going to check the date for sure.  It is still be done being done at [facility].  He is wondering about skipping this treatment because the patient has continued to be free of depression and seems cognitively clearer as these ECT treatments have been spread out further from each other.  Patient remains on medications as prescribed.  If it is truly been over 2 months since the last ECT then it is reasonable to consider  discontinuing ECT.  It is unlikely that ECT with the frequency of greater than 2 months is significantly helpful at preventing her relapse.  If however the ECT frequency is less than every 2 months it could still be helping to prevent recurrence.  He indicated he would consider this information and discuss it with her ECT team and make a decision.  They are heading to [place] in the next few weeks.  Needs to schedule an appointment with me for follow-up because it is been many months.  He agrees.   02/19/2020 appt, the following noted: Moving to [facility] in a few months.  Ready to downsize.   Stopped ECT as discussed and has not been more deprressed.   Walks dog 4 times daily.   Lithium 0.7 on  01/30/20 on lithium 300 mg plus 150 mg on M, W, F Nortriptyline  70 on 75 mg HS. No SE except tremor.  Balance is not great.  Very happy.   No depression since here.  No mood swings.  Sleep and appetite is OK.  No med changes:  04/03/2020 TC with the following noted: Patient's husband called stating that her tremor was a little worse and she was having some stutter. No evidence of stroke otherwise. First thing would be to check serum lithium level and BMP.  Order written.  Have asked her husband to let us know about the lab to which it should be sent.  04/22/20 TC witht he following noted: Lithium level is not dangerously high but it is has crept up to 1.0 which is higher than desired for her.  Our goal for her is 0.5-0.7.  At the current level it is likely causing side effects so we should reduce the dosage from lithium 300 mg plus 150 mg on M, W, F  TO 300 mg daily.  Meaning drop off the 150 mg capsule.  It will take about 2 weeks for the level to gradually come down to the desired level.  05/08/2020 appt with the following noted: Seen with H and [name] nurse. Moving to [facility] and feels OK about it.  I think I'll be fine.   Pretty good with balance.  Doing yoga and it helps. [name] has cancer. Old dog 8 & 1/2 yo still living. [name]'s concerns when went to [place] for D's BD she had difficulty time with tremor lethargy, more confusion.  Better when go back home. [name] notes using more lorazepam. At times gait is unsteady. Not significantly depressed.  Can be anxious at times.  Eating okay.  Sleeping okay.  Is easily confused under stress. Plan: reduce lithium to 300 mg nightly.  07/14/20 appt with the following noted: Seen with her husband as well as her family nurse. Feeling fine and pleased with that.   Some anxiety over the move to smaller place. [name]'s twin also having heart problems Gene. [name]'s kids came and visited.    Plan: Continue nortriptyline 75 mg, paroxetine 20 mg, lithium 300 mg, Namenda 10 mg twice daily, lorazepam 0.5 mg 1/2-1 3 times  daily as needed, mirtazapine 7.5 mg nightly  02/27/2021 appointment with the following noted:  Seen with H and nurse [name] Received email from nurse [name] on 02/24/2021 indicated that he had moved into [facility] living in May.  Arlyss has gradually become more depressed and more anxious using lorazepam as prescribed 3 times daily.  More memory issues.  She has remained engaged and initiating appropriate conversation.  She is very  worried about her 66 year old dog who is in poor health and fears she will become more depressed when the dog dies.  She and her husband do not wish to prefer to pursue further ECT unless absolutely necessary. Likes [facility] so far.   No depression by her report.  Sold big house in Feb.  It worked out well.   Worries about her dog 75 yo will die soon. H agrees she's done well with depression.   Enjoys things and has interests. Plan: Continue Lithium 300 mg daily  Continue nortriptyline 75 mg daily Continue paroxetine 20 mg daily Continue lorazepam 0.5 mg 3 times daily for both anxiety and catatonia Continue vitamin B6 and primidone for tremor Cerefolin NAC 2 daily.  05/13/2021 appointment with the following noted: Happy with [facility].  H and she agree is doing well with depression.[place] socialization. She complains of memory.   Family visited and getting along with Dondra Spry better.  H says Dondra Spry is acting better.  Goes to dinner and activities together now.  Better relationship with Gene.. Tremor has been OK.   Ativan usually 0.25 mg TID and tolerated. Anxiety managed with this generally. Molly dog health more stable. To [place] before T'giving until April.   Plan: Continue Lithium 300 mg daily  Continue nortriptyline 75 mg daily Continue paroxetine 20 mg daily Continue lorazepam 0.5 mg 3 times daily for both anxiety and catatonia Continue vitamin B6 and primidone for tremor   08/14/2021 appointment with the following noted: Seen with her husband [name]  and nurse [name]. Everyone reports that her mood has been stable.  Her anxiety is manageable with as needed lorazepam.  She is still happy with the transition to [facility].  They are preparing to go to [place] for the winter per usual but may sell their house father there. Tolerating meds without any unusual side effects. Is dealing with grief over the loss of her dog Molly.  03/10/2022 appt noted:  seen with H and nurse Oleta Mouse [facility] house. [name] not good phsyically hard to play golf.  Been really hard. He'll be 85 in July.  Some stress scheduling with D's.   60 th wedding necessary this month.   Mood up and down.  Sister in law across the street can be difficult and demeaning.  She feels unsettled.  Some worry. Some back pain limiting activity.   Sleep is ok except with pain awakening.   No SE, except tremor some worse about 3-5 and helped by lorazepam 0.25 mg then No greater than 1 mg lorazepam daily Patient denies any recent difficulty with anxiety except as noted.  Patient denies difficulty with sleep initiation or maintenance. Denies appetite disturbance.    Patient has some difficulty with concentration.  Patient denies any suicidal ideation.  Usually takes Ativan at night and sleeps well 8 hours.   Plan no med changes  10/23/213 appt noted: Still doing well. Pneumonia and hosp for 6 days 3 weeks ago.  Viral.  Improving at this time. Increased tremor gradually in 3-4 mos helped by lorazepam avg 1.00-1.25 mg daily. No change in primidone. Total R hip August 2023 and done well.  Finishing home PT.   Appetite poor for 7-8 mos.  2 protein drinks daily but may not eat more than 1 good meal a day. [facility]. Substantial memory problems in hospital and in unusual enviornments. Nurse notices decline.  B with LBD passed in August. No pain meds or muscle relaxants beyond pain. No depression.  H  note sometimes she has less motivation than others. [name] notes some normal waxing and waning of  mood. Good social interaction.   Not spending winter in [facility] this year.  D still lives there. Plan: Increase mirtazapine 15 mg HS for appetite.  Also used for sleep and depression.  09/23/22 appt noted:  with nurse and [name] [name] feeling bad with more pain and fatigue.  [name] has CHF.   Made [place] sad.  Lost a cgood friends.  Rough time.  Mostly worry about [name].  She feels afraid to be alone if something happens.   Not sig depressed.    Just worry . H can't be as active.  Taking a shower is hard.   She's back exercising again.   Went to [employer] for Thanksgiving in [facility] and plan to go back for Christmas. It's hard to travel. Told nurse she was depressed. Appetite is better and once asleep is OK. Using more lorazepam for anxiety 3-4 daily. Plan: Continue Lithium 300 mg daily  Continue nortriptyline 75 mg daily Continue paroxetine 20 mg daily Continue lorazepam 0.5 mg 3 times daily for both anxiety and catatonia Continue vitamin B6 and primidone for tremor Increased and it helped mirtazapine 15 mg HS for appetite.  Also used for sleep and depression.  10/19/22  RTC  [name] reports not doing well.  Went to [facility].  Recent pneumonia.  They are thinking this is aspiration pneurmonia. Lethargic and anxious with tremor. Ruminating.  Hot to cold .  Low tolerance.  Going downhill quickly.  She's verbalizing depression and desire to isolate.  Won't go out.  Started within the last 2 weeks. Usually doesn't complain when she's sick.   [name]'s biggest concern is that Doreatha Martin is having trouble handling this bc she's up at night rambling and not really safe to be up at night.    Will see her 10/22/22 Get lithium and nortriptyline levels.   Meredith Staggers, MD, [facility]     10/22/22 emergency appt with nurse [name] and H [name]: Pneumonia Oct and then again 2nd week January.  It appears to have largely resoved.  It's really bad.  Nausea. Anxious bc [name] ill.  Doesn't want him to leave.  [name] sees anxiety triggering anxiety with little  to no appetite.  Hypersensitive to stimuli like light and noises.  Doesn't want TV on.  [name] says totally reversed in last 6 weeks.    [name] reports she's overemotional.  Friends died. Losing wt 91.4# today. More trouble with sleep. Lorazepam increased to 2 mg daily.  Not sleepy with it.  Doesn't seem to be helpful. Last week neglect hygiene but better this week a little. [name] says STM is poor at times and at times disoriented if in [place].  Will walk in bathroom and wonder where sh is. Barium swallow pending for evaluation of refulex.    10/26/22 RTC:  RTC   No improvement since Friday.  Gradually less engaged.  Can be overstimulated with visitors.   Disc nortriptyline level 115 is a little higher than it was and higher levels can cause SE in elderly.   Reduce nortriptyline to 50 mg HS. Taking lorazepam   Consider low dose of Vraylar or pramipexole.  Or Spravato.    Meredith Staggers, MD, [facility]APA     11/15/22 appt noted: Dialogue with Dr. Shirlee Latch cardiologist agreed pt should be stable enough from CV perspective to receive Spravato which could elevate BP.  If that occurs use prn metoprolol 25 mg. Disc in  detail with Dr. Corinda Gubler and he agrees Anderson Sink is appropriate though not FDA approved for bipolar depression.  Disc 2 metanalysis in support of this and this alernative is safer and likely better tolerated than the option of ECT and faster than options of trials of Trintellix, Auvelity, Latuda etc. Tolerating meds.  She is depressed and very anxious and intermittently confused.  Taking clothes on and off bc rapid changes in comfort.  Worried over [employer].  Sleep disrupted appetite some better with mirtazapine. Received Spravato 56 mg first time today and was dissociated.  Resolved over 2 hours.  No HA, NV.  Was distressed moderately and very ambivalent about Tx.  H and nurse supportive of tx plan and for the pt.    11/24/22 appt noted: Current meds: nortriptyline 50 mg HS, paroxetine 20 mg daily,  lithium 300 mg nightly, mirtazapine 30 mg nightly, memantine 10 mg twice daily.  Lorazepam 0.5 mg every 4 to 6 hours as needed catatonia or severe anxiety. Received first dose of Spravato 84 mg today.  She was more noticeably dissociated than with the 56 mg dose.  It did resolve over the 2-hour course of observation although she remained somewhat unsteady and needed to leave via wheelchair.  She did not have headache nausea or vomiting.  She remains ambivalent about the treatment. Husband notes she has continued to be anxious and depressed and was extremely anxious yesterday and somewhat confused.  At times would say things that did not make sense.  He remained supportive of the treatment while she remains ambivalent about the treatment.  Her appetite is good and she is eating well.  Sleep is variable.  H reported at times will be almost normal after receiving an extra dose of lorazepam 0.5 mg prn but this only lasts an hour or so. Plan: Continue Lithium 300 mg daily  Continue nortriptyline 50 mg daily Continue paroxetine 20 mg daily Continue vitamin B6 and primidone for tremor Imirtazapine 30 mg HS for appetite,  used for sleep and depression.  Appetite better Switch lorazepam to [facility] ER 2 mg AM and use lorazepam 0.5 mg prn.  For catatonia in hopes of more consistent relief.  Decision based on observation of periods of almost normalcy after some BZ doses.  We discussed potential switch of paroxetine and nortriptyline to Auvelity or Trintellix or possibly Jordan.  However these switches will be difficult and it is hoped that the Spravato may provide enough improvement to allow a switch in medicine more tolerable.  11/26/22 appt noted: Current meds: nortriptyline 50 mg HS, paroxetine 20 mg daily, lithium 300 mg nightly, mirtazapine 30 mg nightly, memantine 10 mg twice daily.  Lorazepam 0.5 mg every 4 to 6 hours as needed catatonia or severe anxiety.  Started Loreev 2 mg daily over weekend Received  Spravato 84 mg today.   Spravato with expected dissociation.  She didn't experience relief from negative emotion.  Dissociation resolved over observation period.  No N, V, HA.   Still high anxiety and dread of most everything.  Depressed with negative thought predominates.  H notes periods of confusion and then maybe an hour of clarity sometimes after the dosing of lorazepam.  Then back to severe sx.  Eating is fair. No SI.  Fearful generally.  Needs help with ADLs.  Cannot take meds on her own. Tolerating meds without SE No noticeable effect from Loreev in terms of benefit or SE  12/01/22 appt noted: Received a phone call from nurse [name] this  morning to call as soon as possible this patient very agitated and confused.  Thrashing around in bed.  At times talking about dying out of the fear that she was going to die.  Highly anxious.  Not very cooperative with drinking water or taking medicines this morning. Discussed the situation with her husband as well and we have to options: 1's if we can get her to the office to receive Spravato is scheduled today that may provide some immediate relief and forestall and may be prevent hospitalization.  The second option is to proceed directly to hospitalization.  If she is unable to take nutrition and hydration hospitalization will be necessary. Discussed that appeared to be catatonic hyperactivity and agitation which has not responded to the extended release lorazepam 2 mg a day.  Therefore she was given 1 mg of Ativan this morning and did calm down enough to make it to the Spravato administration this afternoon.  She was able to cooperate with Spravato administration and tolerated it well with typical levels of dissociation which gradually resolved over the 2 hours.  She was able to eat and drink fluids prior to coming to the appointment. Current meds: nortriptyline 50 mg HS, paroxetine 20 mg daily, lithium 300 mg nightly,  memantine 10 mg twice daily.  Lorazepam 0.5  mg every 4 to 6 hours as needed catatonia or severe anxiety.  Started Loreev 2 mg daily over weekend olanazpine 2.5 mg HS Received Spravato 84 mg today.   Husband notes that after Spravato administration on Monday she was much improved for a period of several hours and was able to have conversations with her family members and make decisions about distributing some of her jewelry and other family decisions.  However over Monday evening into Tuesday she became more agitated and confused and anxious again.  There have been no obvious effects of taking the olanzapine but it does not appear that long-acting lorazepam 2 mg as provided any significant relief. Plan: Continue Lithium 300 mg daily  Continue nortriptyline 50 mg daily Continue paroxetine 20 mg daily Continue vitamin B6 and primidone for tremor DC mirtazapine For catatonia: lorazepam0.5 mg q 4 hour mg daily as tolerated for catatonia with dose spread through the day..  Nurse manages. Restart Loreeve 2 mg daily and use prn lorazepam for catatonia Increase olanzapine 5 mg HS for TRD, anxiety, catatonia, delusions of persecution  12/06/22 appt noted; Psych meds: as above except increase Loreev to 2 mg BID for severe catatonia. nortriptyline 50 mg HS, paroxetine  20 mg daily, lithium 300 mg nightly,  memantine 10 mg twice daily.  Lorazepam 0.5 mg every 4 to 6 hours as needed catatonia or severe anxiety.  Started Loreev 2 mg increased BID for severe catatonia,  increased olanazpine 5 mg HS for 3 days. Received 5th dose of Spravato 84 mg today.    She was able to cooperate with Spravato administration and tolerated it well with typical levels of dissociation which gradually resolved over the 2 hours.  She was able to eat and drink fluids prior to coming to the appointment.   She still feels confused and depressed and anxious. Per family had periods over the weekend where she was confused thinking her H was dead even though he was right in front of her.   Also thinking she is going to die and not get better.  Is eating and drinking fluids well.  Has needed extra lorazepam to sleep.  Limited periods of lucidity per H.  Tolerating high dose lorazepam but limited benefit for catatonia. Plan: Continue Lithium 300 mg daily  reduce nortriptyline 25 mg daily in anticipation of change Reduce paroxetine 10 mg daily in anticipation of change Continue vitamin B6 and primidone for tremor For catatonia: lorazepam0.5 mg q 4 hour mg daily as tolerated for catatonia with dose spread through the day..  Nurse manages. DT severity of catatonic hyperactivy Loreeve 2 mg BID and use prn lorazepam for catatonia Continue olanzapine 5 mg HS for TRD, anxiety, catatonia, delusions of persecution  12/08/22 appt noted: Psych meds:  Loreev to 2 mg BID for severe catatonia. nortriptyline 25 mg HS, paroxetine 10 mg daily, lithium 300 mg nightly,  memantine 10 mg twice daily.  Lorazepam 0.5 mg every 4 to 6 hours as needed catatonia or severe anxiety.  Started Loreev 2 mg increased BID for severe catatonia,  increased olanazpine 5 mg HS for 3 days. Received 6th dose of Spravato 84 mg today.    She was able to cooperate with Spravato administration and tolerated it well with typical levels of dissociation which gradually resolved over the 2 hours.  She was able to eat and drink fluids prior to coming to the appointment.   She still feels confused and depressed and anxious. Hopeless.  Thoughts she is going to die. Per nurse and H no sig improvement noted with spravato.   No current alcohol problems  01/14/2023 appointment on an urgent basis: Hospitalized due to psychiatry at [facility] from 12/10/2022 until 01/03/2023.  Hospitalized for bipolar disorder severe depression with catatonia.  Received ECT and Latuda 60 mg daily was started.  Stopped paroxetine, lithium, nortriptyline, and mirtazapine.  Lorazepam was switched to clonazepam. Current psych meds clonazepam 0.5 mg twice daily,  Latuda 60 mg daily.  Memantine 10 mg twice daily, weekly ECT rec Wt 101# and better appetite.  Sleep not great but ok.  Will lie awake some.  H says it's pretty good. Alert in the day.  No SE with meds.  Tremor better.   Lorazepam only on ECT days. Plan:  For catatonia: Continue clonazepam 0.5 mg twice daily also for anxiety Continue Latuda 60 mg daily Continue lorazepam 0.5 mg as needed which is currently being used rarely. Recommend continue ECT  weekly as [facility] recommends  02/22/23    [employer] nurse called at 11am.  Shauni is doing better, receiving ECT twice this week. Not having breakthrough anxiety now and [name] wants to decrease her morning dose of Klonopin to help decrease her sedation. She wants to give her 1/2 of 0.5mg  Klonopin in the am and just give her regular dose the other two times a day. Is this okay.        Electronically signed by Soundra Pilon L at 02/22/2023 11:21 AM  03/11/23 appt:  Current psych med:  clonazepam 0.25 mg AM and 0.5 mg HS on 5/21, Latuda 60,  Worry anxiety.  Not as happy as those around here. Reduced ECT to weekly does not maintain the benefit.  But they didn't want to continue it.   [name] has  talked with ECT doc about holding off on ECT and missed dose but she's gotten more depressed.   Did twice weekly for awhle and it was beneficial.  Sched for Tuesday Plan: Start Trintellix off label for dep.  Trying to find solution that would allow stopping ECT Recommend continue ECT  but increase to twice weekly as [facility] recommends  04/15/23 appt noted:  with nurse  and H Reduced clonazepam to 0.25 mg AM and 0.5 mg HS, Latuda 60, incr Trintellix 10 ECT once or twice weekly.  Took 13 day holiday with ECT and progressively downhill.  Worse if goes more than a week without ECT.   Some interest but not much initiative.  Read paper some.  Can initiate conversation with nurse with some recent details.  Still other memory problems frustrating to everyone.    Some memory px even before ECT but worse. Sleep good.  Feels good today and appetite ok.   D been at the house lately.  D from [place] No excessive sleep.   She naps some easily in the day.  No sig changes with less clonazepam.  Minimal effect from counseling DT memory. No new concerns.   No SE.    05/20/23 appt noted: Tolerated increase in Trintellix to 15 mg daily.  Reduced clonazepam 0.25 mg AM and 0.375 mg HS. No prn lorazepam.  No dep verbalized since here.  Trouble with sleep.  Intial insomnia.  No caffeine.  Some napping.  Not drowsy now.  Will lay on couch after breakfast.   Not anxious about anything.  Sleep trouble in the last few days. Last ECT a little over 2 weeks to wait another week if possible.  H thinks she is more awake with less ECT.  Will spontaneously seek out paper and fix several. Where do we go from here?   Per H .  Wants her to be able to stop ECT. Nurse notes better exec function.  Nurse has seen some lack of motivation and little spontaneous speech.  Won't engage in conversation much per family.   Still some rumination.   She has no other complaints other than some physical concerns.  Plan: For catatonia: clonazepam to 0.25 mg AM and 0.375 mg HS for 2 weeks then 0.25 mg BID for cog reasons and not currently catatonic Continue Latuda 60 mg daily Continue lorazepam 0.5 mg as needed which is currently being used rarely. Incr Trintellix to 20 mg daily off label for bipolar dep.  Trying to find solution that would allow stopping ECT Recommend continue ECT  but increase to twice weekly as [facility] recommends but considering reduction to every 3 weeks is reasonable.  She is not dep now.  06/24/23 appt noted: urgent appt with H and nurse. Tolerated increase in Trintellix to 20 mg daily.  Reduced clonazepam 0.25 mg AM and 0.375 mg HS. No prn lorazepam, mirtazapine 7.5 mg HS, Latuda 60.  Memantine 10 BID Last eCT Monday every 3-3&1/2 weeks.   Mood and anxiety dipped with time away  from ECT.   ECT still helpful.  Away from ECT harder to read and watch TV.   [facility] wants 2 week interval. Minimal initiation of conversation with others.   Not sure what to say in conversation.   General malaise over the last 10 days. Appetite is fair.  D Clydie Braun came last weekend and was helpful.  Not dep today but was before ECT this week.  Meds have not prevented recurrence. H asks about restarting lithium which helped in the past.  07/25/23 appt : seen with nurse Last ECT 07/19/23.   Lately weekly bc sx got worse.  Nurse has seen improvement.  [facility] saying it is helpful. Added Concerta 18 mg AM.  No SE.  Nurse noticed a little more attempt to conc but not a lot of benefit.   No prn lorazepam needed.   H says  she's sleepy a lot of the time.  Nurse suspects clonazepam as the source of sleepiness. No benefit with more clonazepam. Sleep helped by mirtazapine 7.5 mg HS. No SE with Trintellix 20 mg  Apppetite pretty normal.   3 days without primidone but tremor noticeably worse. Planning to go to [facility] for T[facility]giving week. H using cane and less mobile. Has assistance at home for parts of the day.  Uses Rolator at night when gets up to urinate.  08/26/23 appt noted:  with nurse meds No sig dep nor anxiety.  Sleep good.  Untreated OSA per family.   Per nurse more effort with things with increase concerta 27 AM.  No SE.   Weekly ECT ongoing.  Will be 11 days . Going to [facility] for a week for T'giving.  Looking forward to seeing g'kids.   No prn lorazepam needed.   Has pacemaker for heart rate.   ECT clearly helping.   H agrees gradually improving.    Appetite ok.  Wt consistent 97-99#.   Neuro FU for eval cog 09/14/23.   Needs help with ADLs but less than before.   Plan: increase off label Concerta DT no sig change and no SE at 27 mg so increase to 36 mg AM for TRD and cog problems  10/14/23 appt noted:  with nurse.   Psych meds: Clonazepam 0.25  nightly for catatonia, lorazepam 0.5 mg as needed  he anxiety or insomnia, Latuda 60 mg, memantine 10 mg twice daily, Concerta 36 mg every morning, primidone 100 every morning and 50 nightly for tremor.,  Trintellix 20 mg daily. No difference with increase Concerta.  No benefit.   Episode accidental taking clonazepam 2 mg HS and excessively drowsy for 3 hours.   Neuro reported potential SZ activity on EEG.  Plans 24 hour EEG to evaluate.  She & H had Covid and missed ECT for a month.  When missed ECT was worse with less engagement, lethargy, poor appetite and lack of initiation.  Better with resuming ECT.   Good evening last night and went out to dinner.   Cindy here from [place].  Last ECT 10/03/23 and then 10/11/23 and next will be 10/18/23.  Only weekly ECT until fully better then back to every other day. Now dep better again with ECT and less distressed.   Increase stress and anxiety with [name] Joe with lymphoma recurrence.   Appetite OK now.   Plan: DC Concerta DT NR  01/02/24 appt noted:  no show for appt Per Dr. Karel Jarvis: 10/24/23 1 hour EEG: Her 1-hour EEG in 10/2023 showed occasional right temporal focal slowing and occasional right frontotemporal epileptiform discharges. She had a 24-hour ambulatory EEG in February which showed occasional bifrontal spikes, maximal over the right frontopolar region, independent sharp waves over the bilateral temporal regions with triphasic morphology. No electrographic seizures seen.  Van Clines, MD    12/20/23 12:32 PM Note Spoke to patient's psychiatrist at [facility], Dr. Sabino Gasser about her EEG. She has not had any clinical symptoms of seizure that can be determined. We discussed treating EEG versus treating patient, and agree that since she has not had any clinical symptoms, close clinical monitoring off seizure medication will be beneficial. He reviewed her prior visits and notes that ECT has been very helpful and lifesaving for Meghan Oliver, and that starting seizure medication would affect ECT treatment, but if there is no  other option, they can work around it. Agreed to monitor patient, we will look  into starting a different headache prophylactic medication that is not an anti-seizure medication.         ECT-MADRS    Flowsheet Row Clinical Support from 12/08/2022 in [facility]  MADRS Total Score 54      Mini-Mental    Flowsheet Row Office Visit from 12/07/2023 in [facility]  Total Score (max 30 points ) 28      PHQ2-9    Flowsheet Row Office Visit from 01/14/2023 in [facility] Office Visit from 10/11/2022 in [facility], [facility] Office Visit from 08/17/2022 in [facility], [facility] Office Visit from 07/19/2022 in [facility], [facility] Office Visit from 03/24/2022 in [facility], [facility]  PHQ-2 Total Score 4 6 0 0 1  PHQ-9 Total Score 14 -- -- -- --      Flowsheet Row ED to Hosp-Admission (Discharged) from 07/01/2022 in [facility] Admission (Discharged) from 05/18/2022 in [facility] Pre-Admission Testing 60 from 05/10/2022 in [facility]  C-SSRS RISK CATEGORY No Risk No Risk No Risk        Past Psychiatric Medication Trials:  Prozac 40 with lithium and Zyprexa 2.5 to 5 mg,  nortriptyline, paroxetine, Effexor 225 Wellbutrin 300 mirtazapine,  Others  Pramipexole 0.625 daily hallucinations  Rexulti EPS, Abilify 15, Vraylar 1.5 1 week,  Latuda 10 mg SE, Olanzapine 10 briefly Caplyta briefly no response   Lamotrigine level 7.5  Namenda,  lorazepam,  Clonazaam 0.5 BID  no response Spravato after 6 tx of 84 mg and 2 of 56 mg .  Hospitalized at [facility] on psychiatric unit from 12/10/2022 to 01/03/2023 and received ECT and started Latuda 60 mg daily.  Stopped paroxetine, nortriptyline, and lithium.  this list is not exhaustive  Review of Systems:  Review of Systems  Constitutional:  Positive for fatigue. Negative for appetite change.  Cardiovascular:   Negative for chest pain and palpitations.  Gastrointestinal:  Negative for nausea.       Linzess and Mozantic managed   Musculoskeletal:  Positive for gait problem.  Neurological:  Positive for dizziness, weakness and headaches. Negative for tremors.       Some balance issues  Psychiatric/Behavioral:  Positive for confusion and decreased concentration. Negative for agitation, behavioral problems, dysphoric mood, hallucinations, self-injury, sleep disturbance and suicidal ideas. The patient is not nervous/anxious and is not hyperactive.     Medications: I have reviewed the patient's current medications.  Current Outpatient Medications  Medication Sig Dispense Refill   amLODipine (NORVASC) 10 MG tablet Take 1 tablet (10 mg total) by mouth daily. 90 tablet 1   aspirin 81 MG chewable tablet Chew 1 tablet (81 mg total) by mouth daily.     Calcium Carbonate-Vitamin D (CALCIUM 600/VITAMIN D PO) Take 1 tablet by mouth 2 (two) times daily.     cetirizine (ZYRTEC) 10 MG tablet Take 10 mg by mouth as needed for allergies.     Cholecalciferol (VITAMIN D3) 50 MCG (2000 UT) TABS Take 2,000 Units by mouth in the morning.     clonazePAM (KLONOPIN) 0.5 MG tablet Take 1 tablet (0.5 mg total) by mouth 2 (two) times daily. 180 tablet 0   cloNIDine (CATAPRES) 0.1 MG tablet Take 1 tablet (0.1 mg total) by mouth as needed prior to ECT treatment 20 tablet 2   dexlansoprazole (DEXILANT) 60 MG capsule Take 1 capsule (60 mg total) by mouth daily. 90 capsule 3  glycerin adult 2 g suppository Use 1 suppository as needed if you have not had a bowel movement in  4 to 5 days 25 suppository 0   hydrochlorothiazide (HYDRODIURIL) 12.5 MG tablet Take 1 tablet (12.5 mg total) by mouth daily. 90 tablet 1   Krill Oil 500 MG CAPS Take 500 mg by mouth daily.     lactulose (CHRONULAC) 10 GM/15ML solution Take 30 mLs (20 g total) by mouth 3 (three) times daily between meals as needed for mild constipation. (Patient taking  differently: Take 20 g by mouth daily as needed for mild constipation.) 473 mL 3   Lavender Oil 80 MG CAPS Take 160 mg by mouth at bedtime.     levothyroxine (SYNTHROID) 75 MCG tablet Take 1 tablet (75 mcg total) by mouth daily. 90 tablet 2   levothyroxine (SYNTHROID) 75 MCG tablet Take 1 tablet (75 mcg total) by mouth daily. 90 tablet 2   levothyroxine (SYNTHROID) 75 MCG tablet Take 1 tablet (75 mcg total) by mouth daily. 90 tablet 2   linaclotide (LINZESS) 72 MCG capsule Take 2 capsules (145 mcg total) by mouth daily before breakfast. 180 capsule 1   LORazepam (ATIVAN) 0.5 MG tablet TAKE 2 TABLETS (1 MG)  BY MOUTH EVERY NIGHT AT BEDTIME AND 1 TABLET EVERY 4 HOURS AS NEEDED FOR ANXIETY 270 tablet 0   losartan (COZAAR) 100 MG tablet Take 1 tablet (100 mg total) by mouth daily. 90 tablet 1   Lurasidone HCl 60 MG TABS Take 1 tablet (60 mg total) by mouth daily after supper. 90 tablet 0   magic mouthwash w/lidocaine SOLN Take 5 mLs by mouth 3 (three) times daily as needed for mouth pain. 120 mL 0   memantine (NAMENDA) 10 MG tablet Take 1 tablet (10 mg total) by mouth 2 (two) times daily. 180 tablet 1   metoprolol tartrate (LOPRESSOR) 25 MG tablet Take 1 tablet (25 mg total) by mouth as needed. 45 tablet 3   mirtazapine (REMERON) 7.5 MG tablet Take 1 tablet (7.5 mg total) by mouth at bedtime. 90 tablet 0   Multiple Vitamins-Minerals (MULTIVITAMIN WITH MINERALS) tablet Take 1 tablet by mouth daily.     Multiple Vitamins-Minerals (PRESERVISION AREDS 2 PO) Take 1 capsule by mouth in the morning and at bedtime.     neomycin-polymyxin b-dexamethasone (MAXITROL) 3.5-10000-0.1 OINT Apply thin line (1 cm) of ointment to right eye twice daily for 1 week. (Patient taking differently: as needed.) 3.5 g 1   ofloxacin (OCUFLOX) 0.3 % ophthalmic solution Instill 1 drop into right eye four times daily for 7 days. 5 mL 1   omeprazole-sodium bicarbonate (ZEGERID) 40-1100 MG capsule Take 1 capsule by mouth daily before  breakfast. (Patient taking differently: Take 1 capsule by mouth as needed.) 90 capsule 3   ondansetron (ZOFRAN-ODT) 4 MG disintegrating tablet Place 1 tablet every 6 hours by translingual route as needed. 20 tablet 0   polyethylene glycol (MIRALAX / GLYCOLAX) 17 g packet Take 17 g by mouth as needed for mild constipation.     primidone (MYSOLINE) 50 MG tablet Take 2 tablets (100 mg total) by mouth every morning AND 1 tablet (50 mg total) at bedtime. 270 tablet 0   sennosides-docusate sodium (SENOKOT-S) 8.6-50 MG tablet Take 2 tablets by mouth as needed.     thiamine (VITAMIN B-1) 100 MG tablet Take 1 tablet (100 mg total) by mouth daily. 100 tablet 3   valACYclovir (VALTREX) 1000 MG tablet Take 2 tablets (2,000 mg total) by  mouth at onset, then take 2 tablets 12 hours later. 30 tablet 1   vitamin B-12 (CYANOCOBALAMIN) 1000 MCG tablet Take 1,000 mcg by mouth at bedtime.     vortioxetine HBr (TRINTELLIX) 20 MG TABS tablet Take 1 tablet (20 mg total) by mouth daily. 90 tablet 0   No current facility-administered medications for this visit.    Medication Side Effects: Other: tremor  stable.  No worse.  Allergies:  Allergies  Allergen Reactions   Propranolol Other (See Comments)    Low blood pressure. Bradycardia    Propranolol Hcl Other (See Comments)   Brexpiprazole Other (See Comments)    Aphasia and catatonia    Past Medical History:  Diagnosis Date   Anxiety    Arthritis    "hips, spine" (03/17/2018)   Bipolar II disorder (HCC)    CHF (congestive heart failure) (HCC)    Chronic bronchitis (HCC)    Chronic lower back pain    Chronic right hip pain    CKD (chronic kidney disease), stage II    Coronary artery disease    stent x1   Esophagitis, erosive    GAD (generalized anxiety disorder)    GERD (gastroesophageal reflux disease)    Headache    "maybe monthly" (03/17/2018))   Heart murmur, systolic    History of adenomatous polyp of colon    08-04-2016  tubular adenoma    History of blood transfusion 12/2017   "related to vascular hematoma"   History of electroconvulsive therapy    at [facility]--  started 04-15-2015 to 11-17-2016  total greater than 40 times   History of hiatal hernia    Hyperlipidemia    Hypertension    Hypothyroidism    Internal carotid artery stenosis, bilateral    per last duplex 05-01-2014  bilateral ICA 40-59%   Major depression, chronic    ECT treatments extensive and multiple started 07/ 2016   Memory loss    "both short and long-term; needs frequent reminders to follow instrucitons" (05/16/2017)   Migraines    "none in years" (03/17/2018)   OSA (obstructive sleep apnea)    per study 06/ 2012 moderate OSA  ; "refuses to wear masks" (03/17/2018)   Osteoporosis    Pneumonia 07/29/2022   Poor historian    due to short term memory loss   Presence of permanent cardiac pacemaker 03/17/2018   Pulmonary nodule    monitored by pcp   S/P placement of cardiac pacemaker 03/17/18 [facility]  03/18/2018   Short-term memory loss    Sick sinus syndrome (HCC)     Family History  Problem Relation Age of Onset   Heart attack Father 80       deceased   Hypertension Father    Heart disease Father    Dementia Brother    Parkinson's disease Brother    Heart disease Brother    Breast cancer Paternal Aunt        Age 25's   Breast cancer Paternal Grandmother        Age unknown   Colon cancer Neg Hx     Social History   Socioeconomic History   Marital status: Married    Spouse name: Dr. Victorino Dike   Number of children: 2   Years of education: 15   Highest education level: Not on file  Occupational History   Occupation: housewife    Employer: UNEMPLOYED  Tobacco Use   Smoking status: Former    Current packs/day: 0.00  Average packs/day: 2.0 packs/day for 15.0 years (30.0 ttl pk-yrs)    Types: Cigarettes    Start date: 01/13/1956    Quit date: 01/13/1971    Years since quitting: 53.0    Passive exposure: Never   Smokeless tobacco:  Never  Vaping Use   Vaping status: Never Used  Substance and Sexual Activity   Alcohol use: Yes    Comment: occassional 1 x a week   Drug use: Never   Sexual activity: Not Currently    Comment: intercourse age 89, sexual partners less than 5  Other Topics Concern   Not on file  Social History Narrative   Right handed   Drinks caffeine no   Single home at [employer]   Social Drivers of Health   Financial Resource Strain: Low Risk  (12/12/2022)   Received from [facility], [facility]   Overall Financial Resource Strain (CARDIA)    Difficulty of Paying Living Expenses: Not hard at all  Food Insecurity: No Food Insecurity (12/12/2022)   Received from [facility], [facility]   Hunger Vital Sign    Worried About Running Out of Food in the Last Year: Never true    Ran Out of Food in the Last Year: Never true  Transportation Needs: No Transportation Needs (12/12/2022)   Received from [facility], [facility]   [facility] - Transportation    In the past 12 months, has lack of transportation kept you from medical appointments or from getting medications?: No    Lack of Transportation (Non-Medical): No  Physical Activity: Not on file  Stress: Not on file  Social Connections: Not on file  Intimate Partner Violence: Not At Risk (07/06/2022)   Humiliation, Afraid, Rape, and Kick questionnaire    Fear of Current or Ex-Partner: No    Emotionally Abused: No    Physically Abused: No    Sexually Abused: No    Past Medical History, Surgical history, Social history, and Family history were reviewed and updated as appropriate.   Please see review of systems for further details on the patient's review from today.   Objective:   Physical Exam:  LMP  (LMP Unknown)   Physical Exam Constitutional:      General: She is not in acute distress.    Appearance: She is well-developed.   Musculoskeletal:        General: No deformity.  Neurological:     Mental Status: She is alert and oriented to person, place, and time.     Motor: No tremor.     Coordination: Coordination abnormal.     Comments: Cane use  Psychiatric:        Attention and Perception: She is attentive.        Mood and Affect: Mood is anxious. Mood is not depressed. Affect is not labile, blunt or inappropriate.        Speech: Speech is not slurred.        Behavior: Behavior normal. Behavior is not slowed, withdrawn or combative.        Thought Content: Thought content is not delusional. Thought content does not include homicidal or suicidal ideation. Thought content does not include suicidal plan.        Cognition and Memory: Cognition is impaired. She exhibits impaired recent memory.        Judgment: Judgment normal.     Comments: Insight fair affected by STM px. No auditory or  visual hallucinations.  Depression resolved.  anxiety remarkably improved Delusional guilt not elicited Affect pleasant and smiling . Minimal spontaneous speech     Lab Review:     Component Value Date/Time   NA 139 06/27/2023 1242   NA 137 12/29/2022 0000   K 3.8 06/27/2023 1242   CL 102 06/27/2023 1242   CO2 22 06/27/2023 1242   GLUCOSE 63 (L) 06/27/2023 1242   BUN 20 06/27/2023 1242   BUN 12 12/29/2022 0000   CREATININE 0.77 06/27/2023 1242   CALCIUM 9.5 06/27/2023 1242   PROT 6.8 06/27/2023 1242   PROT 6.2 04/20/2017 1151   ALBUMIN 3.9 09/02/2022 1221   ALBUMIN 3.9 04/20/2017 1151   AST 18 06/27/2023 1242   ALT 17 06/27/2023 1242   ALKPHOS 64 09/02/2022 1221   BILITOT 0.4 06/27/2023 1242   BILITOT <0.2 04/20/2017 1151   GFRNONAA >60 07/08/2022 0429   GFRNONAA 50 (L) 07/10/2020 1021   GFRAA 58 (L) 07/10/2020 1021       Component Value Date/Time   WBC 7.0 06/27/2023 1242   RBC 4.13 06/27/2023 1242   HGB 12.0 06/27/2023 1242   HGB 10.2 (L) 04/20/2017 1151   HCT 37.7 06/27/2023 1242   HCT 29.6 (L)  04/20/2017 1151   PLT 241 06/27/2023 1242   PLT 385 (H) 04/20/2017 1151   MCV 91.3 06/27/2023 1242   MCV 89 04/20/2017 1151   MCH 29.1 06/27/2023 1242   MCHC 31.8 (L) 06/27/2023 1242   RDW 14.5 06/27/2023 1242   RDW 13.9 04/20/2017 1151   LYMPHSABS 2,310 06/27/2023 1242   LYMPHSABS 1.5 04/20/2017 1151   MONOABS 0.7 07/07/2022 0458   EOSABS 91 06/27/2023 1242   EOSABS 0.3 04/20/2017 1151   BASOSABS 42 06/27/2023 1242   BASOSABS 0.0 04/20/2017 1151    Lithium Lvl  Date Value Ref Range Status  10/21/2022 0.5 (L) 0.6 - 1.2 mmol/L Final  10/21/2022 lithium level 0.5 on 300 mg nightly is stable.,  Nortriptyline 115 on 75 mg HS.  08/02/22 nortriptyline 85, lihtium 0.5  Nortriptyline level 51 on 50 mg nightly with paroxetine 20 mg daily  07/10/2020 lithium level 0.6 at [facility] office and nortriptyline level 130 on the current dosages of lithium 300 mg nightly and nortriptyline 75 mg nightly  02/20/2021 labs lithium 0.6 on 300 mg nightly.  Nortriptyline level 49 which is lower than expected on 75 mg nightly along with paroxetine 20 mg daily.    No results found for: "PHENYTOIN", "PHENOBARB", "VALPROATE", "CBMZ"   .res Assessment: Plan:    Severe bipolar I disorder, current or most recent episode depressed, with catatonia (HCC)  Generalized anxiety disorder  Memory loss  Insomnia due to mental condition  Essential tremor  Memory impairment    History of lithium toxicity repeatedly while on chlorthalidone. No tremor px off of the lithium.    30-minute visit today with  nurse present..  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  Recent decompensation and rapidly getting worse with some cognive impairment and mild psychotic sx and altered mental status. Her case has been highly complex and treatment resistant at times.  No current catatonia but is somewhat anxious easily but only with changes.  No lorazepam needed.Marland Kitchen  And worse with reduction of  clonazepam without improvement in cog with reduction. So  increased it back to 0.5 mg BID but balance issues and sleepiness so reducing back to 0.25 mg BID.  Then reduced to 0.25 mg HS without cog  benefit or alertness or energy  and more HA so increase back to 0.25 mg BID   She had remarkedly improved with the ECT + change in psychiatric medicine to Latuda 60 mg daily with clonazepam 0.5 mg twice daily. But they would like to get her meds to the point she could stop ECT bc cog problems which are ongoing.  She is not ruminative.  Affect is bright.  Dep resolved including anhedonia largely with ECT.  Catatonia is resolved .  Cog is better with skipping a couple of weeks of ECT but is not normal with STM px and not very talkative socially  she can recall events of the last few days..   She is still anxious and somewhat insecure.  Short-term memory impairment is consistent with what would be expected with the ECT at this time. ECT is weekly at [facility].  options of trials of ultra-low dose lithium Auvelity, etc. Reviewed the old chart and consider very low-dose Vraylar   Previously Disc Dr. Blossom Hoops concerns about longterm ECT on cognition. Seeing neuro for work up of potential SZ episodes. 12/07/23 Neuro Dr. Patrcia Dolly reviewed EEG with the following:  Her routine and 24-hour EEG were abnormal with spikes in the bifrontal regions (maximal at right frontopolar), bilateral temporal sharp waves with triphasic morphology. She has not had any convulsive seizures, there have been periods of catatonia lasting for several days which would be unusual for seizure, however post-ictal psychosis is a consideration.  She is in a much better state today, MMSE 28/30 which is a significant improvement compared to prior visits/testing.   For catatonia and anxiety and HA increase clonazepam back to 0.25 mg AM and 0.25 mg PM .  There was no benefit with less including cognition. Continue Latuda 60 mg daily Continue lorazepam  0.5 mg as needed which is currently being used rarely. continue Trintellix to 20 mg daily off label for bipolar dep.    We discussed the short-term risks associated with benzodiazepines including sedation and increased fall risk among others.  Discussed long-term side effect risk including dependence, potential withdrawal symptoms, and the potential eventual dose-related risk of dementia.  But recent studies from 2020 dispute this association between benzodiazepines and dementia risk. Newer studies in 2020 do not support an association with dementia.    Discussed potential metabolic side effects associated with atypical antipsychotics, as well as potential risk for movement side effects. Advised pt to contact office if movement side effects occur.   Recommend continue ECT  weekly as [facility] recommends.  She is not dep now but it tends to recur between ECT if treatments are spread out.  FU 4 weeks  Meredith Staggers MD, DFAPA Please see After Visit Summary for patient specific instructions.  Future Appointments  Date Time Provider Department Center  01/02/2024  1:30 PM Cottle, Steva Ready., MD [facility] None  01/09/2024  2:30 PM Napoleon Form, MD [facility]  01/25/2024  2:15 PM Chilton Greathouse, MD [facility]PUL[facility] None  03/23/2024 10:30 AM Van Clines, MD LBN[facility] None  04/02/2024  7:00 AM [facility] DEVICE REMOTES [facility]  07/02/2024  7:00 AM [facility] DEVICE REMOTES [facility]  10/01/2024  7:00 AM [facility] DEVICE REMOTES [facility]  12/31/2024  7:00 AM [facility] DEVICE REMOTES [facility]  04/01/2025  7:00 AM [facility] DEVICE REMOTES [facility]  07/01/2025  7:00 AM [facility] DEVICE REMOTES [facility]  09/30/2025  7:00 AM [facility] DEVICE REMOTES [facility]      No  orders of the defined types were placed in this encounter.      -------------------------------

## 2024-01-09 ENCOUNTER — Other Ambulatory Visit (HOSPITAL_BASED_OUTPATIENT_CLINIC_OR_DEPARTMENT_OTHER): Payer: Self-pay

## 2024-01-09 ENCOUNTER — Ambulatory Visit (INDEPENDENT_AMBULATORY_CARE_PROVIDER_SITE_OTHER): Payer: Medicare Other | Admitting: Gastroenterology

## 2024-01-09 VITALS — BP 128/60 | HR 62 | Ht 58.5 in | Wt 100.0 lb

## 2024-01-09 DIAGNOSIS — K5909 Other constipation: Secondary | ICD-10-CM

## 2024-01-09 DIAGNOSIS — K219 Gastro-esophageal reflux disease without esophagitis: Secondary | ICD-10-CM

## 2024-01-09 DIAGNOSIS — K5904 Chronic idiopathic constipation: Secondary | ICD-10-CM

## 2024-01-09 DIAGNOSIS — K2100 Gastro-esophageal reflux disease with esophagitis, without bleeding: Secondary | ICD-10-CM

## 2024-01-09 DIAGNOSIS — H353211 Exudative age-related macular degeneration, right eye, with active choroidal neovascularization: Secondary | ICD-10-CM | POA: Diagnosis not present

## 2024-01-09 MED ORDER — DEXLANSOPRAZOLE 60 MG PO CPDR
60.0000 mg | DELAYED_RELEASE_CAPSULE | Freq: Every day | ORAL | 3 refills | Status: AC
  Filled 2024-01-09 – 2024-03-22 (×2): qty 90, 90d supply, fill #0
  Filled 2024-06-21: qty 90, 90d supply, fill #1
  Filled 2024-09-20 – 2024-10-03 (×2): qty 90, 90d supply, fill #2

## 2024-01-09 MED ORDER — OFLOXACIN 0.3 % OP SOLN
1.0000 [drp] | Freq: Three times a day (TID) | OPHTHALMIC | 3 refills | Status: AC
Start: 2024-01-09 — End: ?
  Filled 2024-03-22: qty 5, 34d supply, fill #0
  Filled 2024-06-21: qty 5, 34d supply, fill #1

## 2024-01-09 MED ORDER — OFLOXACIN 0.3 % OP SOLN
1.0000 [drp] | Freq: Three times a day (TID) | OPHTHALMIC | 3 refills | Status: AC
  Filled 2024-01-09: qty 5, 34d supply, fill #0
  Filled 2024-02-07 – 2024-02-14 (×4): qty 5, 34d supply, fill #1

## 2024-01-09 MED ORDER — LINACLOTIDE 72 MCG PO CAPS
145.0000 ug | ORAL_CAPSULE | Freq: Every day | ORAL | 1 refills | Status: DC
  Filled 2024-01-09: qty 180, 89d supply, fill #0
  Filled 2024-03-22: qty 180, 90d supply, fill #0
  Filled 2024-06-21 – 2024-07-15 (×3): qty 180, 90d supply, fill #1

## 2024-01-09 MED ORDER — MAXITROL 3.5-10000-0.1 OP OINT
TOPICAL_OINTMENT | OPHTHALMIC | 1 refills | Status: DC
Start: 2024-01-09 — End: 2024-06-25
  Filled 2024-01-09: qty 3.5, 7d supply, fill #0
  Filled 2024-01-12 – 2024-03-22 (×2): qty 3.5, 7d supply, fill #1

## 2024-01-09 NOTE — Patient Instructions (Signed)
 We have sent the following medications to your pharmacy for you to pick up at your convenience: Linzess Dexilant

## 2024-01-09 NOTE — Progress Notes (Signed)
 Meghan Oliver    [id number]    1941-06-22  Primary Care Physician:Baxley, Luanna Cole, MD  Referring Physician: Margaree Mackintosh, MD [address] [place],  [place] [address]   Chief complaint:  GERD, constipation  Discussed the use of AI scribe software for clinical note transcription with the patient, who gave verbal consent to proceed.  History of Present Illness Meghan Oliver is an 83 year old female who presents for a gastroenterology follow-up. She is accompanied by [name], her caregiver.  She experiences chronic constipation, managed with daily Linzess and Miralax as needed. Bowel movements occur once daily, but she experiences cycles of looser stools to constipation. Recently, she had three to four days of loose stools, possibly triggered by an excessive dose of a laxative given by a night nurse, and potentially exacerbated by a gastrointestinal bug.  She has a history of acid reflux, for which she takes Dexilant. No recent reflux symptoms such as trouble swallowing or heartburn, and she does not wake up with reflux at night. She maintains a consistent sleeping position to help manage her symptoms. She has tried other medications like Pepcid and Zegerid, with Zegerid being more effective for her symptoms of fullness and bleeding.  Her appetite is generally reasonable, with a preference for vanilla ice cream with hot fudge. She tries to incorporate at least one protein shake daily. No significant weight loss reported.  She continues to experience headaches frequently, despite a negative MRI brain workup and EEG showing some spikes but nothing unexpected. She is undergoing ECT at intervals of two to three weeks, which has been associated with short-term relapses. Approximately two to three weeks ago, she experienced a 36-hour period of feeling like her 'complete normal self' from ten years ago, engaging actively with friends and family.   MR Brain without contrast  11/22/23 1.  No evidence of an acute intracranial abnormality. 2. Mild chronic small vessel ischemic disease, similar to the prior brain MRI of 07/02/2022. 3. Mild generalized cerebral atrophy. 4. Mild-to-moderate right maxillary sinus mucosal thickening. 5. Fluid within the bilateral mastoid air cells (small-volume right, trace left).  CT Chest high res 10/12/2022 1. Waxing and waning airspace consolidation, now seen in both lower lobes, with a trace left pleural effusion. Pneumonia or aspiration could have this appearance.  2. Assessment for underlying interstitial lung disease is limited. 3. Small to moderate pericardial effusion. 4. Aortic atherosclerosis (ICD10-I70.0). Coronary artery calcification.  EGD 10/05/2017 in [place]: White flecks in esophagus, biopsies to r/o candida Mild antral gastritis, medium sized hernia otherwise normal. Normal duodenum.   CT abdomen & pelvis 08/09/2017:  Mild diverticulitis of distal sigmoid colon. Large stool burden.   EGD 08/04/2016: Erosive esophagitis with esophageal ulcers, hiatal hernia, gastropathy  Outpatient Encounter Medications as of 01/09/2024  Medication Sig   amLODipine (NORVASC) 10 MG tablet Take 1 tablet (10 mg total) by mouth daily.   aspirin 81 MG chewable tablet Chew 1 tablet (81 mg total) by mouth daily.   Calcium Carbonate-Vitamin D (CALCIUM 600/VITAMIN D PO) Take 1 tablet by mouth 2 (two) times daily.   cetirizine (ZYRTEC) 10 MG tablet Take 10 mg by mouth as needed for allergies.   Cholecalciferol (VITAMIN D3) 50 MCG (2000 UT) TABS Take 2,000 Units by mouth in the morning.   clonazePAM (KLONOPIN) 0.5 MG tablet Take 1 tablet (0.5 mg total) by mouth 2 (two) times daily.  cloNIDine (CATAPRES) 0.1 MG tablet Take 1 tablet (0.1 mg total) by mouth as needed prior to ECT treatment   glycerin adult 2 g suppository Use 1 suppository as needed if you have not had a bowel movement in  4 to 5 days   hydrochlorothiazide (HYDRODIURIL) 12.5 MG  tablet Take 1 tablet (12.5 mg total) by mouth daily.   Krill Oil 500 MG CAPS Take 500 mg by mouth daily.   lactulose (CHRONULAC) 10 GM/15ML solution Take 30 mLs (20 g total) by mouth 3 (three) times daily between meals as needed for mild constipation. (Patient taking differently: Take 20 g by mouth daily as needed for mild constipation.)   Lavender Oil 80 MG CAPS Take 160 mg by mouth at bedtime.   levothyroxine (SYNTHROID) 75 MCG tablet Take 1 tablet (75 mcg total) by mouth daily.   levothyroxine (SYNTHROID) 75 MCG tablet Take 1 tablet (75 mcg total) by mouth daily.   LORazepam (ATIVAN) 0.5 MG tablet TAKE 2 TABLETS (1 MG)  BY MOUTH EVERY NIGHT AT BEDTIME AND 1 TABLET EVERY 4 HOURS AS NEEDED FOR ANXIETY   losartan (COZAAR) 100 MG tablet Take 1 tablet (100 mg total) by mouth daily.   Lurasidone HCl 60 MG TABS Take 1 tablet (60 mg total) by mouth daily after supper.   magic mouthwash w/lidocaine SOLN Take 5 mLs by mouth 3 (three) times daily as needed for mouth pain.   memantine (NAMENDA) 10 MG tablet Take 1 tablet (10 mg total) by mouth 2 (two) times daily.   metoprolol tartrate (LOPRESSOR) 25 MG tablet Take 1 tablet (25 mg total) by mouth as needed.   mirtazapine (REMERON) 7.5 MG tablet Take 1 tablet (7.5 mg total) by mouth at bedtime.   Multiple Vitamins-Minerals (MULTIVITAMIN WITH MINERALS) tablet Take 1 tablet by mouth daily.   Multiple Vitamins-Minerals (PRESERVISION AREDS 2 PO) Take 1 capsule by mouth in the morning and at bedtime.   neomycin-polymyxin b-dexamethasone (MAXITROL) 3.5-10000-0.1 OINT Apply thin line (1 cm) of ointment to right eye twice daily for 1 week. (Patient taking differently: as needed.)   neomycin-polymyxin-dexameth (MAXITROL) 0.1 % OINT apply a small amount into the affected eye twice a day for 1 week (Patient taking differently: as needed. apply a small amount into the affected eye twice a day for 1 week)   ofloxacin (OCUFLOX) 0.3 % ophthalmic solution Place 1 drop into  affected eye(s) 3 (three) times daily for 5 days   ofloxacin (OCUFLOX) 0.3 % ophthalmic solution Instill 1 drop into affected eye three times daily for 5 days.   omeprazole-sodium bicarbonate (ZEGERID) 40-1100 MG capsule Take 1 capsule by mouth daily before breakfast. (Patient taking differently: Take 1 capsule by mouth as needed.)   ondansetron (ZOFRAN-ODT) 4 MG disintegrating tablet Place 1 tablet every 6 hours by translingual route as needed.   polyethylene glycol (MIRALAX / GLYCOLAX) 17 g packet Take 17 g by mouth as needed for mild constipation.   primidone (MYSOLINE) 50 MG tablet Take 2 tablets (100 mg total) by mouth every morning AND 1 tablet (50 mg total) at bedtime.   sennosides-docusate sodium (SENOKOT-S) 8.6-50 MG tablet Take 2 tablets by mouth as needed.   thiamine (VITAMIN B-1) 100 MG tablet Take 1 tablet (100 mg total) by mouth daily.   valACYclovir (VALTREX) 1000 MG tablet Take 2 tablets (2,000 mg total) by mouth at onset, then take 2 tablets 12 hours later. (Patient taking differently: Take 2,000 mg by mouth as needed.)   vitamin B-12 (  CYANOCOBALAMIN) 1000 MCG tablet Take 1,000 mcg by mouth at bedtime.   vortioxetine HBr (TRINTELLIX) 20 MG TABS tablet Take 1 tablet (20 mg total) by mouth daily.   [DISCONTINUED] dexlansoprazole (DEXILANT) 60 MG capsule Take 1 capsule (60 mg total) by mouth daily.   [DISCONTINUED] levothyroxine (SYNTHROID) 75 MCG tablet Take 1 tablet (75 mcg total) by mouth daily.   [DISCONTINUED] linaclotide (LINZESS) 72 MCG capsule Take 2 capsules (145 mcg total) by mouth daily before breakfast.   [DISCONTINUED] ofloxacin (OCUFLOX) 0.3 % ophthalmic solution Instill 1 drop into right eye four times daily for 7 days.   dexlansoprazole (DEXILANT) 60 MG capsule Take 1 capsule (60 mg total) by mouth daily.   linaclotide (LINZESS) 72 MCG capsule Take 2 capsules (145 mcg total) by mouth daily before breakfast.   No facility-administered encounter medications on file as of  01/09/2024.    Allergies as of 01/09/2024 - Review Complete 12/07/2023  Allergen Reaction Noted   Propranolol Other (See Comments) 08/06/2013   Propranolol hcl Other (See Comments) 09/14/2023   Brexpiprazole Other (See Comments) 11/27/2015    Past Medical History:  Diagnosis Date   Anxiety    Arthritis    "hips, spine" (03/17/2018)   Bipolar II disorder (HCC)    CHF (congestive heart failure) (HCC)    Chronic bronchitis (HCC)    Chronic lower back pain    Chronic right hip pain    CKD (chronic kidney disease), stage II    Coronary artery disease    stent x1   Esophagitis, erosive    GAD (generalized anxiety disorder)    GERD (gastroesophageal reflux disease)    Headache    "maybe monthly" (03/17/2018))   Heart murmur, systolic    History of adenomatous polyp of colon    08-04-2016  tubular adenoma   History of blood transfusion 12/2017   "related to vascular hematoma"   History of electroconvulsive therapy    at [facility]--  started 04-15-2015 to 11-17-2016  total greater than 40 times   History of hiatal hernia    Hyperlipidemia    Hypertension    Hypothyroidism    Internal carotid artery stenosis, bilateral    per last duplex 05-01-2014  bilateral ICA 40-59%   Major depression, chronic    ECT treatments extensive and multiple started 07/ 2016   Memory loss    "both short and long-term; needs frequent reminders to follow instrucitons" (05/16/2017)   Migraines    "none in years" (03/17/2018)   OSA (obstructive sleep apnea)    per study 06/ 2012 moderate OSA  ; "refuses to wear masks" (03/17/2018)   Osteoporosis    Pneumonia 07/29/2022   Poor historian    due to short term memory loss   Presence of permanent cardiac pacemaker 03/17/2018   Pulmonary nodule    monitored by pcp   S/P placement of cardiac pacemaker 03/17/18 [facility]  03/18/2018   Short-term memory loss    Sick sinus syndrome [facility])     Past Surgical History:  Procedure Laterality Date   APPENDECTOMY  1971    ARTERY BIOPSY Left 07/05/2022   Procedure: BIOPSY TEMPORAL ARTERY;  Surgeon: Victorino Sparrow, MD;  Location: [facility] OR;  Service: Vascular;  Laterality: Left;   AUGMENTATION MAMMAPLASTY Bilateral    CARDIOVASCULAR STRESS TEST  01-30-2015  dr Shirlee Latch   Low risk nuclear study w/ no evidence ischemia or infarction/  normal LV funciton and wall motion , 76%   COLONOSCOPY W/ BIOPSIES AND  POLYPECTOMY  "multiple"   COLONOSCOPY WITH ESOPHAGOGASTRODUODENOSCOPY (EGD)  last one 08-04-2016   CORONARY ANGIOPLASTY WITH STENT PLACEMENT  05/16/2017   "LAD"   CORONARY STENT INTERVENTION N/A 05/16/2017   Procedure: CORONARY STENT INTERVENTION;  Surgeon: Kathleene Hazel, MD;  Location: [facility] INVASIVE CV LAB;  Service: Cardiovascular;  Laterality: N/A;   ESOPHAGOGASTRODUODENOSCOPY  02-26-04   ESOPHAGOGASTRODUODENOSCOPY  "multiple"   INSERT / REPLACE / REMOVE PACEMAKER  03/17/2018   IR THORACENTESIS ASP PLEURAL SPACE W/IMG GUIDE  07/07/2022   LEFT HEART CATH AND CORONARY ANGIOGRAPHY N/A 05/16/2017   Procedure: LEFT HEART CATH AND CORONARY ANGIOGRAPHY;  Surgeon: Laurey Morale, MD;  Location: [facility] INVASIVE CV LAB;  Service: Cardiovascular;  Laterality: N/A;   OVARIAN CYST SURGERY  1970s   Laparotomy    PACEMAKER IMPLANT N/A 03/17/2018   Procedure: PACEMAKER IMPLANT;  Surgeon: Marinus Maw, MD;  Location: [facility] INVASIVE CV LAB;  Service: Cardiovascular;  Laterality: N/A;   PORT-A-CATH PLACEMENT  05/31/2016; 2018   "@ [facility]; for ECT series"; "@ [facility] also"   PORT-A-CATH REMOVAL N/A 03/11/2017   Procedure: REMOVAL PORT-A-CATH;  Surgeon: Avel Peace, MD;  Location[facility]  Service: General;  Laterality: N/A;   PORTA CATH REMOVAL  03/17/2018   PORTA CATH REMOVAL  03/17/2018   Procedure: PORTA CATH REMOVAL;  Surgeon: Marinus Maw, MD;  Location: [facility] INVASIVE CV LAB;  Service: Cardiovascular;;   TOTAL HIP ARTHROPLASTY Right 05/18/2022   Procedure: TOTAL HIP ARTHROPLASTY ANTERIOR APPROACH;   Surgeon: Durene Romans, MD;  Location: [facility] ORS;  Service: Orthopedics;  Laterality: Right;   TRANSTHORACIC ECHOCARDIOGRAM  09/05/2013  dr Shirlee Latch   mild LVH, ef 65-70%, grade 1 diastolic dysfunction/  very mild AV stenosis with mild AR/  trivial MR and PT/ mild to moderate LAE/ mild TR/ mild pulmonary hypertension with PA peak pressure   TUBAL LIGATION      Family History  Problem Relation Age of Onset   Heart attack Father 64       deceased   Hypertension Father    Heart disease Father    Dementia Brother    Parkinson's disease Brother    Heart disease Brother    Breast cancer Paternal Aunt        Age 65's   Breast cancer Paternal Grandmother        Age unknown   Colon cancer Neg Hx     Social History   Socioeconomic History   Marital status: Married    Spouse name: Dr. Victorino Dike   Number of children: 2   Years of education: 15   Highest education level: Not on file  Occupational History   Occupation: housewife    Employer: UNEMPLOYED  Tobacco Use   Smoking status: Former    Current packs/day: 0.00    Average packs/day: 2.0 packs/day for 15.0 years (30.0 ttl pk-yrs)    Types: Cigarettes    Start date: 01/13/1956    Quit date: 01/13/1971    Years since quitting: 53.0    Passive exposure: Never   Smokeless tobacco: Never  Vaping Use   Vaping status: Never Used  Substance and Sexual Activity   Alcohol use: Yes    Comment: occassional 1 x a week   Drug use: Never   Sexual activity: Not Currently    Comment: intercourse age 56, sexual partners less than 5  Other Topics Concern   Not on file  Social History Narrative   Right handed  Drinks caffeine no   Single home at [employer]   Financial Resource Strain: Low Risk  (12/12/2022)   Received from [facility], [facility]   Overall Financial Resource Strain (CARDIA)    Difficulty of Paying Living Expenses: Not hard at all  Food Insecurity: No  Food Insecurity (12/12/2022)   Received from [facility], [facility]   Hunger Vital Sign    Worried About Running Out of Food in the Last Year: Never true    Ran Out of Food in the Last Year: Never true  Transportation Needs: No Transportation Needs (12/12/2022)   Received from [facility], [facility]   [facility] - Transportation    In the past 12 months, has lack of transportation kept you from medical appointments or from getting medications?: No    Lack of Transportation (Non-Medical): No  Physical Activity: Not on file  Stress: Not on file  Social Connections: Not on file  Intimate Partner Violence: Not At Risk (07/06/2022)   Humiliation, Afraid, Rape, and Kick questionnaire    Fear of Current or Ex-Partner: No    Emotionally Abused: No    Physically Abused: No    Sexually Abused: No      Review of systems: All other review of systems negative except as mentioned in the HPI.   Physical Exam: Vitals:   01/09/24 1448  BP: 128/60  Pulse: 62   Body mass index is 20.54 kg/m. Gen:      No acute distress HEENT:  sclera anicteric Abd:      soft, non-tender; no palpable masses, no distension Ext:    No edema Neuro: alert and oriented x 3 Psych: normal mood and affect  Data Reviewed:  Reviewed labs, radiology imaging, old records and pertinent past GI work up     Assessment and Plan Assessment & Plan Gastroesophageal Reflux Disease (GERD) GERD is well-managed with Dexilant, with no heartburn or nocturnal reflux. Continued use of Dexilant is warranted due to its effectiveness, slow gastric motility, and sleep apnea. Insurance coverage is adequate, and alternatives like Zegerid or Pepcid have been less effective. - Continue Dexilant for GERD management - Consider adding Zegerid if additional symptoms arise  Constipation Chronic constipation with bowel movements once daily, experiencing cycles of  loose stools likely due to excessive laxative use  Managed with Linzess and Miralax as needed. - Continue with daily Linzess and Miralax as needed for constipation management  The patient was provided an opportunity to ask questions and all were answered. The patient agreed with the plan and demonstrated an understanding of the instructions.  Iona Beard , MD    CC: Margaree Mackintosh, MD

## 2024-01-10 ENCOUNTER — Other Ambulatory Visit (HOSPITAL_COMMUNITY): Payer: Self-pay | Admitting: Cardiology

## 2024-01-10 ENCOUNTER — Encounter: Payer: Self-pay | Admitting: Gastroenterology

## 2024-01-12 ENCOUNTER — Other Ambulatory Visit (HOSPITAL_BASED_OUTPATIENT_CLINIC_OR_DEPARTMENT_OTHER): Payer: Self-pay

## 2024-01-13 ENCOUNTER — Other Ambulatory Visit (HOSPITAL_BASED_OUTPATIENT_CLINIC_OR_DEPARTMENT_OTHER): Payer: Self-pay

## 2024-01-13 MED ORDER — METOPROLOL TARTRATE 25 MG PO TABS: 25.0000 mg | ORAL_TABLET | ORAL | 0 refills | Status: AC | PRN

## 2024-01-17 ENCOUNTER — Encounter: Payer: Self-pay | Admitting: Internal Medicine

## 2024-01-17 ENCOUNTER — Ambulatory Visit: Admitting: Internal Medicine

## 2024-01-17 DIAGNOSIS — Z01818 Encounter for other preprocedural examination: Secondary | ICD-10-CM | POA: Diagnosis not present

## 2024-01-17 DIAGNOSIS — J849 Interstitial pulmonary disease, unspecified: Secondary | ICD-10-CM | POA: Diagnosis not present

## 2024-01-17 DIAGNOSIS — Z87891 Personal history of nicotine dependence: Secondary | ICD-10-CM | POA: Diagnosis not present

## 2024-01-17 DIAGNOSIS — F319 Bipolar disorder, unspecified: Secondary | ICD-10-CM | POA: Diagnosis not present

## 2024-01-17 DIAGNOSIS — I110 Hypertensive heart disease with heart failure: Secondary | ICD-10-CM | POA: Diagnosis not present

## 2024-01-17 DIAGNOSIS — I495 Sick sinus syndrome: Secondary | ICD-10-CM | POA: Diagnosis not present

## 2024-01-17 DIAGNOSIS — R9431 Abnormal electrocardiogram [ECG] [EKG]: Secondary | ICD-10-CM | POA: Diagnosis not present

## 2024-01-17 DIAGNOSIS — F061 Catatonic disorder due to known physiological condition: Secondary | ICD-10-CM | POA: Diagnosis not present

## 2024-01-17 DIAGNOSIS — E039 Hypothyroidism, unspecified: Secondary | ICD-10-CM | POA: Diagnosis not present

## 2024-01-17 DIAGNOSIS — F332 Major depressive disorder, recurrent severe without psychotic features: Secondary | ICD-10-CM | POA: Diagnosis not present

## 2024-01-17 DIAGNOSIS — I2510 Atherosclerotic heart disease of native coronary artery without angina pectoris: Secondary | ICD-10-CM | POA: Diagnosis not present

## 2024-01-17 DIAGNOSIS — J449 Chronic obstructive pulmonary disease, unspecified: Secondary | ICD-10-CM | POA: Diagnosis not present

## 2024-01-17 DIAGNOSIS — N189 Chronic kidney disease, unspecified: Secondary | ICD-10-CM | POA: Diagnosis not present

## 2024-01-17 DIAGNOSIS — I5032 Chronic diastolic (congestive) heart failure: Secondary | ICD-10-CM | POA: Diagnosis not present

## 2024-01-17 DIAGNOSIS — K219 Gastro-esophageal reflux disease without esophagitis: Secondary | ICD-10-CM | POA: Diagnosis not present

## 2024-01-17 DIAGNOSIS — G4730 Sleep apnea, unspecified: Secondary | ICD-10-CM | POA: Diagnosis not present

## 2024-01-17 DIAGNOSIS — I272 Pulmonary hypertension, unspecified: Secondary | ICD-10-CM | POA: Diagnosis not present

## 2024-01-17 DIAGNOSIS — F419 Anxiety disorder, unspecified: Secondary | ICD-10-CM | POA: Diagnosis not present

## 2024-01-17 NOTE — Progress Notes (Incomplete)
 Patient Care Team: Sylvan Evener, MD as PCP - General (Internal Medicine) Darlis Eisenmenger, MD as PCP - Cardiology (Cardiology) Tammie Fall, MD as PCP - Electrophysiology (Cardiology) Kristopher Pheasant, MD (Cardiology) Cottle, Kennedy Peabody., MD (Psychiatry) Jinger Mount Amiel Balder, MD (Inactive) (Obstetrics and Gynecology) Sergio Dandy, MD as Consulting Physician (Gastroenterology) Jhonny Moss, MD as Consulting Physician (Neurology)  Visit Date: 01/26/24  Subjective:  No chief complaint on file.  BP Readings from Last 1 Encounters:  01/09/24 128/60   Patient QM:VHQI L Correira,Female DOB:23-Nov-1940,83 y.o. ONG:[id number]   83 y.o. Female presents today for 6 months follow-up for ***. Patient has a past medical history of ***.  Atrial Paced Rhythm w/ Permanent Pacemaker Insertion due to Sinus Node Dysfunction in 2019 followed by Cardiology. Takes   Metoprolol  tartrate 25 mg as needed HCTZ 12.5 mg daily Losartan  100 mg daily Catapres  0.1 mg as needed prior to ECT Amlodipine  10 mg daily  Hyperlipidemia Lab Results  Component Value Date   CHOL 211 (H) 03/05/2022   HDL 118 03/05/2022   LDLCALC 79 03/05/2022   LDLDIRECT 98.2 04/10/2007   TRIG 49 03/05/2022   CHOLHDL 1.8 03/05/2022   History of Hypothyroidism treated with Levothyroxine  75 mcg. ***/***/*** TSH  Lab Results  Component Value Date   TSH 1.58 07/20/2022   Osteoporosis w/ T-score -2.6 at Left Femoral Neck in 03/2021; Osteoarthritis, Left Hip; S/p Left Hip Arthroplasty 05/2022  Bipolar Disorder; Depression; Mild Memory Loss treated with Namenda  10 mg twice daily. Followed by [name]  Esophageal Stricture dilated in 2022 by Dr. Willy Harvest and treated with Dexilant  60 mg daily; Chronic Constipation treated with Miralax  17 g as needed   Past Medical History:  Diagnosis Date   Anxiety    Arthritis    "hips, spine" (03/17/2018)   Bipolar II disorder (HCC)    CHF (congestive heart failure) (HCC)     Chronic bronchitis (HCC)    Chronic lower back pain    Chronic right hip pain    CKD (chronic kidney disease), stage II    Coronary artery disease    stent x1   Esophagitis, erosive    GAD (generalized anxiety disorder)    GERD (gastroesophageal reflux disease)    Headache    "maybe monthly" (03/17/2018))   Heart murmur, systolic    History of adenomatous polyp of colon    08-04-2016  tubular adenoma   History of blood transfusion 12/2017   "related to vascular hematoma"   History of electroconvulsive therapy    at [facility]--  started 04-15-2015 to 11-17-2016  total greater than 40 times   History of hiatal hernia    Hyperlipidemia    Hypertension    Hypothyroidism    Internal carotid artery stenosis, bilateral    per last duplex 05-01-2014  bilateral ICA 40-59%   Major depression, chronic    ECT treatments extensive and multiple started 07/ 2016   Memory loss    "both short and long-term; needs frequent reminders to follow instrucitons" (05/16/2017)   Migraines    "none in years" (03/17/2018)   OSA (obstructive sleep apnea)    per study 06/ 2012 moderate OSA  ; "refuses to wear masks" (03/17/2018)   Osteoporosis    Pneumonia 07/29/2022   Poor historian    due to short term memory loss   Presence of permanent cardiac pacemaker 03/17/2018   Pulmonary nodule    monitored by pcp   S/P placement of cardiac  pacemaker 03/17/18 [facility]  03/18/2018   Short-term memory loss    Sick sinus syndrome (HCC)     Allergies  Allergen Reactions   Propranolol Other (See Comments)    Low blood pressure. Bradycardia    Propranolol Hcl Other (See Comments)   Brexpiprazole Other (See Comments)    Aphasia and catatonia    Family History  Problem Relation Age of Onset   Heart attack Father 40       deceased   Hypertension Father    Heart disease Father    Dementia Brother    Parkinson's disease Brother    Heart disease Brother    Breast cancer Paternal Aunt        Age 41's   Breast cancer  Paternal Grandmother        Age unknown   Colon cancer Neg Hx    Social History   Social History Narrative   Right handed   Drinks caffeine  no   Single home at [employer]   ROS   Objective:  Vitals: LMP  (LMP Unknown)   Physical Exam  Results:  Studies Obtained And Personally Reviewed By Me:  Imaging, colonoscopy, mammogram, bone density scan, echocardiogram, heart cath, stress test, CT calcium  score, etc. ***  Labs:     Component Value Date/Time   NA 139 06/27/2023 1242   NA 137 12/29/2022 0000   K 3.8 06/27/2023 1242   CL 102 06/27/2023 1242   CO2 22 06/27/2023 1242   GLUCOSE 63 (L) 06/27/2023 1242   BUN 20 06/27/2023 1242   BUN 12 12/29/2022 0000   CREATININE 0.77 06/27/2023 1242   CALCIUM  9.5 06/27/2023 1242   PROT 6.8 06/27/2023 1242   PROT 6.2 04/20/2017 1151   ALBUMIN 3.9 09/02/2022 1221   ALBUMIN 3.9 04/20/2017 1151   AST 18 06/27/2023 1242   ALT 17 06/27/2023 1242   ALKPHOS 64 09/02/2022 1221   BILITOT 0.4 06/27/2023 1242   BILITOT <0.2 04/20/2017 1151   GFRNONAA >60 07/08/2022 0429   GFRNONAA 50 (L) 07/10/2020 1021   GFRAA 58 (L) 07/10/2020 1021    Lab Results  Component Value Date   WBC 7.0 06/27/2023   HGB 12.0 06/27/2023   HCT 37.7 06/27/2023   MCV 91.3 06/27/2023   PLT 241 06/27/2023   Lab Results  Component Value Date   CHOL 211 (H) 03/05/2022   HDL 118 03/05/2022   LDLCALC 79 03/05/2022   LDLDIRECT 98.2 04/10/2007   TRIG 49 03/05/2022   CHOLHDL 1.8 03/05/2022   Lab Results  Component Value Date   HGBA1C 5.1 02/15/2011    Lab Results  Component Value Date   TSH 1.58 07/20/2022    {PSA (Optional):32132} No results found for any visits on 01/26/24. Assessment & Plan:   ***    I,Emily Lagle,acting as a scribe for Sylvan Evener, MD.,have documented all relevant documentation on the behalf of Sylvan Evener, MD,as directed by  Sylvan Evener, MD while in the presence of Sylvan Evener, MD.   ***

## 2024-01-24 ENCOUNTER — Other Ambulatory Visit (HOSPITAL_BASED_OUTPATIENT_CLINIC_OR_DEPARTMENT_OTHER): Payer: Self-pay

## 2024-01-25 ENCOUNTER — Ambulatory Visit: Payer: Medicare Other | Admitting: Pulmonary Disease

## 2024-01-25 ENCOUNTER — Other Ambulatory Visit: Payer: Self-pay

## 2024-01-25 DIAGNOSIS — I5022 Chronic systolic (congestive) heart failure: Secondary | ICD-10-CM

## 2024-01-26 ENCOUNTER — Ambulatory Visit: Admitting: Internal Medicine

## 2024-02-01 NOTE — Progress Notes (Signed)
 Patient Care Team: Meghan Evener, MD as PCP - General (Internal Medicine) Meghan Eisenmenger, MD as PCP - Cardiology (Cardiology) Meghan Fall, MD as PCP - Electrophysiology (Cardiology) Meghan Pheasant, MD (Cardiology) Meghan Oliver, Meghan Oliver., MD (Psychiatry) Meghan Mount Amiel Balder, MD (Inactive) (Obstetrics and Gynecology) Meghan Dandy, MD as Consulting Physician (Gastroenterology) Meghan Moss, MD as Consulting Physician (Neurology)  Visit Date: 02/16/24  Subjective:   Chief Complaint  Patient presents with  . Medical Management of Chronic Issues  . 6 month follow up  Patient ZO:XWRU L Meghan Oliver,Female DOB:September 03, 1941,83 y.o. MRN:[id number]   83 y.o. Female, ambulating with a cane and here with her personal nurse/caregiver Meghan Oliver, presents today for follow-up for Hypertension; Bipolar Disorder; Hyperlipidemia; Hypothyroidism. Patient has a past medical history of CAD; Elevated LFTs; S/p Placement of Pacemaker; Memory Loss. Last seen for her annual  exam  hereon 03/24/2022, but last seen in-office here 06/27/2023. She is accompanied by her nurse, [name], R.N. Has continued treatment for Bipolar disorder with [name], Psychiatrist, here and  also at [facility] Has issues with anxiety as well.   History of Hypertension treated with Amlodipine  10 mg daily, Metoprolol  tartrate 25 mg as needed, Catapres  as needed prior to ECT, Losartan  100 mg daily, and HCTZ 12.5 mg daily. Blood Pressure: normotensive today at 100/60. S/p Pacemaker Placement 03/2018 due to Sinus Node Dysfunction; CAD s/p PCI followed by Cardiology.  History of Hyperlipidemia with 2018 Cardiac CTA, though was complicated by artifact, impressed mild stenosis in most arteries, though extensive calcified plaque in left main, proximal LAD, large D1, and ramus. No calcium  score due to aortic valve calcification.   History of Hypothyroidism treated with Levothyroxine  75 mcg daily.   History of Bipolar Disorder treated with Latuda   40 mg daily ; Anxiety/Depression treated with Klonopin  0.25 mg twice daily, Ativan  1 mg at bedtime and 0.5 as needed, and Trintellix  20 mg daily; Insomnia treated with Remeron  7.5 mg at bedtime and she says that she sleeps well most nights; Memory Loss treated with Namenda  10 mg twice daily. Meghan Oliver says that she had about 36 hours of lucidity back in April while planning for birthday and last Saturday was lucid for 6 hours. Also notes that about 80% of the time [name]'s affect is either flat or despondent. Followed by Dr. Nori Beat, Psychiatrist, here.   History of Chronic Non-intractable Headache treated with Nurtec 75 mg every other day; Lithium -Induced Tremor treated with Primidone  150 mg daily, 100 mg morning and 50 mg at bedtime. Followed by Neurology, which she saw on 3/05. Meghan Oliver says that [name] saw Neuro back in January as she had been having daily headaches occurring a couple of times per day - neuro evaluation was normal, however there were some abnormal spikes on her EEG, which was re-evaluated and found those spikes to be intermittent.   History of Chronic Constipation treated with Miralax  8.5 g daily, Linzess  145 mcg daily, Glycerin  2 g suppository as needed, Lactulose  20 g as needed, and Senokot 8.6 - 50 mg as needed. Meghan Oliver says that [name] does have a hemorrhoid that occasionally bleeds and does cause rectal pressure.   BMI 20.44 today, weighing 99 lbs 8 oz. According to Meghan Oliver, [name]'s weight tends to range between 95 - 100 pounds. Sometimes eats out a couple of nights a week.  Past Medical History:  Diagnosis Date  . Anxiety   . Arthritis    "hips, spine" (03/17/2018)  . Bipolar II disorder (HCC)   .  CHF (congestive heart failure) (HCC)   . Chronic bronchitis (HCC)   . Chronic lower back pain   . Chronic right hip pain   . CKD (chronic kidney disease), stage II   . Coronary artery disease    stent x1  . Esophagitis, erosive   . GAD (generalized anxiety disorder)   . GERD (gastroesophageal  reflux disease)   . Headache    "maybe monthly" (03/17/2018))  . Heart murmur, systolic   . History of adenomatous polyp of colon    08-04-2016  tubular adenoma  . History of blood transfusion 12/2017   "related to vascular hematoma"  . History of electroconvulsive therapy    at [facility]--  started 04-15-2015 to 11-17-2016  total greater than 40 times  . History of hiatal hernia   . Hyperlipidemia   . Hypertension   . Hypothyroidism   . Internal carotid artery stenosis, bilateral    per last duplex 05-01-2014  bilateral ICA 40-59%  . Major depression, chronic    ECT treatments extensive and multiple started 07/ 2016  . Memory loss    "both short and long-term; needs frequent reminders to follow instrucitons" (05/16/2017)  . Migraines    "none in years" (03/17/2018)  . OSA (obstructive sleep apnea)    per study 06/ 2012 moderate OSA  ; "refuses to wear masks" (03/17/2018)  . Osteoporosis   . Pneumonia 07/29/2022  . Poor historian    due to short term memory loss  . Presence of permanent cardiac pacemaker 03/17/2018  . Pulmonary nodule    monitored by pcp  . S/P placement of cardiac pacemaker 03/17/18 [facility]  03/18/2018  . Short-term memory loss   . Sick sinus syndrome (HCC)     Allergies  Allergen Reactions  . Propranolol Other (See Comments)    Low blood pressure. Bradycardia   . Propranolol Hcl Other (See Comments)  . Brexpiprazole Other (See Comments)    Aphasia and catatonia    Family History  Problem Relation Age of Onset  . Heart attack Father 50       deceased  . Hypertension Father   . Heart disease Father   . Dementia Brother   . Parkinson's disease Brother   . Heart disease Brother   . Breast cancer Paternal Aunt        Age 92's  . Breast cancer Paternal Grandmother        Age unknown  . Colon cancer Neg Hx    Social History   Social History Narrative   Right handed   Drinks caffeine  no   Single home at [place]  Has 2 new caregivers. Trainer comes  in for exercise. Uses Rolator at night to prevent falls. Resides with husband independently at [place].  Review of Systems  Constitutional:  Negative for fever and malaise/fatigue.  HENT:  Negative for congestion.        (+) Missing tooth  Eyes:  Negative for blurred vision.  Respiratory:  Negative for cough and shortness of breath.   Cardiovascular:  Negative for chest pain, palpitations and leg swelling.  Gastrointestinal:  Negative for vomiting.  Musculoskeletal:  Negative for back pain.  Skin:  Negative for rash.  Neurological:  Negative for loss of consciousness and headaches.     Objective:  Vitals: BP 100/60   Pulse 63   Ht 4' 10.5" (1.486 m)   Wt 99 lb 8 oz (45.1 kg)   LMP  (LMP Unknown)   SpO2 96%  BMI 20.44 kg/m  Physical Exam Vitals and nursing note reviewed.  Constitutional:      General: She is not in acute distress.    Appearance: Normal appearance. She is not toxic-appearing.  HENT:     Head: Normocephalic and atraumatic.  Cardiovascular:     Rate and Rhythm: Normal rate and regular rhythm. No extrasystoles are present.    Pulses: Normal pulses.     Heart sounds: Murmur heard.     No friction rub. No gallop.  Pulmonary:     Effort: Pulmonary effort is normal. No respiratory distress.     Breath sounds: Normal breath sounds. No wheezing or rales.  Skin:    General: Skin is warm and dry.  Neurological:     Mental Status: She is alert and oriented to person, place, and time. Mental status is at baseline.  Psychiatric:        Mood and Affect: Mood normal.        Behavior: Behavior normal.        Thought Content: Thought content normal.        Judgment: Judgment normal.    Results:  Studies Obtained And Personally Reviewed By Me:  Cardiac CTA  05/09/2017  FINDINGS:  Calcium  Score: Unable to obtain accurate calcium  score, cannot separate out aortic valve calcification.   Coronary Arteries: Right dominant with no anomalies   LM:  Calcified plaque  ostial left main, mild stenosis.   LAD system: Extensive calcified plaque in the proximal to mid LAD. Cannot rule out up to 50% stenosis in the mid LAD but there does not appear to be severe stenosis. Large D1 with prominent calcified plaque in the proximal and mid vessel, evaluation is limited by artifact but does not appear to have severe stenosis.   Circumflex system: Moderate ramus with proximal calcified plaque, mild stenosis. AV LCx had calcified plaque proximally. Interpretation of this vessel limited distally by artifact but suspect no significant stenosis.   RCA:  Calcified plaque in the mid RCA with minimal stenosis.   IMPRESSION: 1.  Artifact makes interpretation of the LAD and LCx difficult.   2. The RCA does not appear to have significant stenosis (no more than mild plaque in the mid vessel).   3. There is extensive calcified plaque involving the left main, proximal-mid LAD, large D1, and ramus.   4. The LAD does not appear to have more than 50% stenosis in the mid vessel. D1 is difficult to interpret due to artifact, but I do not think that there is significant stenosis.   5.  Ramus does not appear to have more than mild stenosis.   6. The AV LCx is difficult to interpret due to artifact. I do not think it has significant stenosis.  Non-cardiac: OVER-READ INTERPRETATION  CT CHEST     COMPARISON:  CT 09/05/2013    FINDINGS:  Limited view of the lung parenchyma demonstrates no suspicious  nodularity. Airways are normal.    Limited view of the mediastinum demonstrates no adenopathy.  Esophagus normal.    Limited view of the upper abdomen unremarkable.    Limited view of the skeleton and chest wall demonstrates  subglandular breast implants. No aggressive osseous lesion. Healed  fracture of the anteromedial RIGHT ribs (image 8 and 13 of series  12)    IMPRESSION:  No significant extracardiac findings. Healing RIGHT anterior rib  fractures.    Labs:      Component Value Date/Time   NA 139 06/27/2023  1242   NA 137 12/29/2022 0000   K 3.8 06/27/2023 1242   CL 102 06/27/2023 1242   CO2 22 06/27/2023 1242   GLUCOSE 63 (L) 06/27/2023 1242   BUN 20 06/27/2023 1242   BUN 12 12/29/2022 0000   CREATININE 0.77 06/27/2023 1242   CALCIUM  9.5 06/27/2023 1242   PROT 6.8 06/27/2023 1242   PROT 6.2 04/20/2017 1151   ALBUMIN 3.9 09/02/2022 1221   ALBUMIN 3.9 04/20/2017 1151   AST 18 06/27/2023 1242   ALT 17 06/27/2023 1242   ALKPHOS 64 09/02/2022 1221   BILITOT 0.4 06/27/2023 1242   BILITOT <0.2 04/20/2017 1151   GFRNONAA >60 07/08/2022 0429   GFRNONAA 50 (L) 07/10/2020 1021   GFRAA 58 (L) 07/10/2020 1021    Lab Results  Component Value Date   WBC 7.0 06/27/2023   HGB 12.0 06/27/2023   HCT 37.7 06/27/2023   MCV 91.3 06/27/2023   PLT 241 06/27/2023   Lab Results  Component Value Date   CHOL 211 (H) 03/05/2022   HDL 118 03/05/2022   LDLCALC 79 03/05/2022   LDLDIRECT 98.2 04/10/2007   TRIG 49 03/05/2022   CHOLHDL 1.8 03/05/2022   Lab Results  Component Value Date   HGBA1C 5.1 02/15/2011    Lab Results  Component Value Date   TSH 1.58 07/20/2022   Assessment & Plan:   Orders Placed This Encounter  Procedures  . CBC with Differential/Platelet  . COMPLETE METABOLIC PANEL WITHOUT GFR  . TSH   Hypertension treated with Amlodipine  10 mg daily, Metoprolol  tartrate 25 mg as needed, Catapres  as needed prior to ECT, Losartan  100 mg daily, and HCTZ 12.5 mg daily. Blood Pressure: normotensive today at 100/60.   S/p Pacemaker Placement 03/2018 due to Sinus Node Dysfunction; CAD s/p PCI followed by Cardiology.  Hyperlipidemia with 2018 Cardiac CTA, though was complicated by artifact, impressed mild stenosis in most arteries, though extensive calcified plaque in left main, proximal LAD, large D1, and ramus. No calcium  score due to aortic valve calcification.   Hypothyroidism treated with Levothyroxine  75 mcg daily.   Bipolar Disorder  treated with Latuda  40 mg daily. Anxiety/Depression treated with Klonopin  0.25 mg twice daily, Ativan  1 mg at bedtime and 0.5 as needed, and Trintellix  20 mg daily. Insomnia treated with Remeron  7.5 mg at bedtime and she says that she sleeps well most nights. Memory Loss treated with Namenda  10 mg twice daily. Meghan Oliver says that she had about 36 hours of lucidity back in April while planning for birthday and last Saturday was lucid for 6 hours. Also notes that about 80% of the time Sholonda's affect is either flat or despondent.  Followed by Dr. Nori Beat. Has ECT treatments every 3 weeks.    Chronic Non-intractable Headache treated with Nurtec 75 mg every other day. Lithium -Induced Tremor treated with Primidone  150 mg daily, 100 mg morning and 50 mg at bedtime. Followed by Neurology, which she saw on 3/05. Meghan Oliver says that Kortni saw Neuro back in January as she had been having daily headaches occurring a couple of times per day - neuro evaluation was normal, however there were some abnormal spikes on her EEG, which was re-evaluated and found those spikes to be intermittent.   Chronic Constipation treated with Miralax  8.5 g daily, Linzess  145 mcg daily, Glycerin  2 g suppository as needed, Lactulose  20 g as needed, and Senokot 8.6 - 50 mg as needed. Meghan Oliver says that Bobbe does have a hemorrhoid that occasionally bleeds and does  cause rectal pressure.   BMI 20.44 today, weighing 99 lbs 8 oz. According to Meghan Oliver, Skylin's weight tends to range between 95 - 100 pounds. Sometimes eats out a couple of nights a week.  Medicare Wellness Questionnaire filled out today as she hadn't had one since 2023.   Return in 7 months (on 09/24/2024) for and 10/01/2024.    I,Emily Lagle,acting as a Neurosurgeon for Meghan Evener, MD.,have documented all relevant documentation on the behalf of Meghan Evener, MD,as directed by  Meghan Evener, MD while in the presence of Meghan Evener, MD.   ***

## 2024-02-08 ENCOUNTER — Telehealth: Payer: Self-pay | Admitting: Neurology

## 2024-02-08 DIAGNOSIS — K219 Gastro-esophageal reflux disease without esophagitis: Secondary | ICD-10-CM | POA: Diagnosis not present

## 2024-02-08 DIAGNOSIS — G4730 Sleep apnea, unspecified: Secondary | ICD-10-CM | POA: Diagnosis not present

## 2024-02-08 DIAGNOSIS — F332 Major depressive disorder, recurrent severe without psychotic features: Secondary | ICD-10-CM | POA: Diagnosis not present

## 2024-02-08 DIAGNOSIS — I5032 Chronic diastolic (congestive) heart failure: Secondary | ICD-10-CM | POA: Diagnosis not present

## 2024-02-08 DIAGNOSIS — J449 Chronic obstructive pulmonary disease, unspecified: Secondary | ICD-10-CM | POA: Diagnosis not present

## 2024-02-08 DIAGNOSIS — F419 Anxiety disorder, unspecified: Secondary | ICD-10-CM | POA: Diagnosis not present

## 2024-02-08 DIAGNOSIS — E039 Hypothyroidism, unspecified: Secondary | ICD-10-CM | POA: Diagnosis not present

## 2024-02-08 DIAGNOSIS — I2510 Atherosclerotic heart disease of native coronary artery without angina pectoris: Secondary | ICD-10-CM | POA: Diagnosis not present

## 2024-02-08 NOTE — Telephone Encounter (Signed)
 Pt is having headaches more often. They have been trying not to medicate due to rebound, but the pt is not tolerating that well.

## 2024-02-09 ENCOUNTER — Encounter (HOSPITAL_COMMUNITY): Payer: Self-pay

## 2024-02-09 ENCOUNTER — Other Ambulatory Visit (HOSPITAL_BASED_OUTPATIENT_CLINIC_OR_DEPARTMENT_OTHER): Payer: Self-pay

## 2024-02-09 MED ORDER — NURTEC 75 MG PO TBDP
ORAL_TABLET | ORAL | 5 refills | Status: DC
  Filled 2024-02-09: qty 16, 30d supply, fill #0

## 2024-02-09 NOTE — Telephone Encounter (Signed)
 Spoke to [employer]. She is complaining of headaches at least daily, sometimes multiple times a day. They are trying not to medicate, maybe twice a week she gets Naproxen or Aleve. No nausea/vomiting. She is always very sensitive to light regardless of HA, no phonophobia. No prior history of migraines, both daughters have migraines. She will wake up with a headache, sometimes it will pass or get distracted. No neck pain. No congestion.   Has been on Amitriptyline, Gabapentin , Lamotrigine, Toprol , Venlafaxine. We will try Nurtec every other day.

## 2024-02-10 ENCOUNTER — Telehealth: Payer: Self-pay

## 2024-02-10 ENCOUNTER — Ambulatory Visit (INDEPENDENT_AMBULATORY_CARE_PROVIDER_SITE_OTHER): Admitting: Psychiatry

## 2024-02-10 ENCOUNTER — Ambulatory Visit: Admitting: Psychiatry

## 2024-02-10 ENCOUNTER — Encounter: Payer: Self-pay | Admitting: Psychiatry

## 2024-02-10 ENCOUNTER — Other Ambulatory Visit (HOSPITAL_BASED_OUTPATIENT_CLINIC_OR_DEPARTMENT_OTHER): Payer: Self-pay

## 2024-02-10 DIAGNOSIS — R413 Other amnesia: Secondary | ICD-10-CM

## 2024-02-10 DIAGNOSIS — F314 Bipolar disorder, current episode depressed, severe, without psychotic features: Secondary | ICD-10-CM

## 2024-02-10 DIAGNOSIS — G250 Essential tremor: Secondary | ICD-10-CM | POA: Diagnosis not present

## 2024-02-10 DIAGNOSIS — F5105 Insomnia due to other mental disorder: Secondary | ICD-10-CM

## 2024-02-10 DIAGNOSIS — F061 Catatonic disorder due to known physiological condition: Secondary | ICD-10-CM | POA: Diagnosis not present

## 2024-02-10 DIAGNOSIS — F411 Generalized anxiety disorder: Secondary | ICD-10-CM

## 2024-02-10 NOTE — Telephone Encounter (Signed)
 Pharmacy Patient Advocate Encounter   Received notification from CoverMyMeds that prior authorization for Nurtec 75MG  dispersible tablets is required/requested.   Insurance verification completed.   The patient is insured through [employer] .   Per test claim: PA required; PA submitted to above mentioned insurance via CoverMyMeds Key/confirmation #/EOC [id number] Status is pending

## 2024-02-10 NOTE — Progress Notes (Signed)
 Meghan Oliver [id number] Aug 06, 1941 83 y.o.  Subjective:   Patient ID:  Meghan Oliver is a 83 y.o. (DOB 1941-06-19) female. Patient was last seen August 21, 2018 Chief Complaint:  Chief Complaint  Patient presents with   Follow-up   Depression   Altered Mental Status   Anxiety     [name] presents to the office today for follow-up of Severe TR bipolar depression with psychotic features including catatonia.  11/30/2018 was the last visit and the following was noted: Had ECT yesterday at [facility].  Last was Jan 8 and was doing well then.  Next scheduled April 15.  Had a lithium  level from there which is pending. Pretty good until the last 10 days to 2 weeks with less energy and motivation.  Bothered by old sick dog.  Worried about how that will affect her.  Has slept with the dog.  [place]ed [place].  Going to gym and playing [place].  Took the class and didn't feel she caught on quickly. Wonders if it is bc of the ECT memory effects.  Bridge is harder and not playing.  Planning to return to [facility] mid April.   spent the winter in [place]  per usual..  No significant depression since here. ECT frequency about 6 weeks.  Gets very anxious the day of the ECT.  ECT consistently for a year.  Best in mood in 7-8 years.  Also the pacemaker helped.  Friends notice the benefit.  Plan:  Option increase in the nortriptyline  if needed.  Considered this.  They agree and would like to increase nortriptyline  to 75 to try to reduce tendency to depression before the ECT.   03/19/19  Addendum: Here the contents from an email from the nurse for [name]  Hello [name],   I hope you and your family are healthy and well. I took [name] to [facility] for ECT and here are labs from 01/26/19. I am going to also forward them to [facility] for follow up with her LFT's. They are still higher than I would like.  Her lithium  level was 0.51 on a daily dose of 300mg  qohs x 4 nights- alternating with 450mg  qohs x 3 nights. She  is not demonstrating any signs of elevated levels, minimal hand tremors, less anxiety and restlessness since her ECT treatment.  Her Nortryptyline level was 140 on 75mg  qhs.  I would like to continue her current dosages of both the above meds and recheck her lithium  level at her next scheduled ECT of 03/23/19.  Please let me know of any changes you would like to make. Take care, [name] RN Lithium  level was 0.4 on the 450mg  alt with 300 mg QOD. No changes were made in meds.  11/15/2019 phone call: Telephone call from patient's husband [name] on 11/14/2019  Patient is scheduled for ECT soon for maintenance ECT.  He thinks it is been 2-1/2 months since the last ECT but he is going to check the date for sure.  It is still be done being done at [facility].  He is wondering about skipping this treatment because the patient has continued to be free of depression and seems cognitively clearer as these ECT treatments have been spread out further from each other.  Patient remains on medications as prescribed.  If it is truly been over 2 months since the last ECT then it is reasonable to consider discontinuing ECT.  It is unlikely that ECT with the frequency of  greater than 2 months is significantly helpful at preventing her relapse.  If however the ECT frequency is less than every 2 months it could still be helping to prevent recurrence.  He indicated he would consider this information and discuss it with her ECT team and make a decision.  They are heading to [place]  in the next few weeks.  Needs to schedule an appointment with me for follow-up because it is been many months.  He agrees.   02/19/2020 appt, the following noted: Moving to [facility] in a few months.  Ready to downsize.   Stopped ECT as discussed and has not been more deprressed.   Walks dog 4 times daily.   Lithium  0.7 on  01/30/20 on lithium  300 mg plus 150 mg on M, W, F Nortriptyline  70 on 75 mg HS. No SE except tremor.  Balance is  not great.  Very happy.   No depression since here.  No mood swings.  Sleep and appetite is OK.  No med changes:  04/03/2020 TC with the following noted: Patient's husband called stating that her tremor was a little worse and she was having some stutter. No evidence of stroke otherwise. First thing would be to check serum lithium  level and BMP.  Order written.  Have asked her husband to let us  know about the lab to which it should be sent.  04/22/20 TC witht he following noted: Lithium  level is not dangerously high but it is has crept up to 1.0 which is higher than desired for her.  Our goal for her is 0.5-0.7.  At the current level it is likely causing side effects so we should reduce the dosage from lithium  300 mg plus 150 mg on M, W, F  TO 300 mg daily.  Meaning drop off the 150 mg capsule.  It will take about 2 weeks for the level to gradually come down to the desired level.  05/08/2020 appt with the following noted: Seen with H and [name] nurse. Moving to [facility] and feels OK about it.  I think I'll be fine.   Pretty good with balance.  Doing yoga and it helps. [name] has cancer. Old dog 27 & 1/2 yo still living. [name]'s concerns when went to [place] for D's BD she had difficulty time with tremor lethargy, more confusion.  Better when go back home. [name] notes using more lorazepam . At times gait is unsteady. Not significantly depressed.  Can be anxious at times.  Eating okay.  Sleeping okay.  Is easily confused under stress. Plan: reduce lithium  to 300 mg nightly.  07/14/20 appt with the following noted: Seen with her husband as well as her family nurse. Feeling fine and pleased with that.   Some anxiety over the move to smaller place. [name]'s twin also having heart problems Gene. [name]'s kids came and visited.    Plan: Continue nortriptyline  75 mg, paroxetine  20 mg, lithium  300 mg, Namenda  10 mg twice daily, lorazepam  0.5 mg 1/2-1 3 times daily as needed, mirtazapine  7.5 mg  nightly  02/27/2021 appointment with the following noted:  Seen with H and nurse [name] Received email from nurse [name] on 02/24/2021 indicated that he had moved into [facility] in May.  Aleira has gradually become more depressed and more anxious using lorazepam  as prescribed 3 times daily.  More memory issues.  She has remained engaged and initiating appropriate conversation.  She is very worried about her 75 year old dog who is in poor health and fears  she will become more depressed when the dog dies.  She and her husband do not wish to prefer to pursue further ECT unless absolutely necessary. Likes [facility] so far.   No depression by her report.  Sold big house in Feb.  It worked out well.   Worries about her dog 65 yo will die soon. H agrees she's done well with depression.   Enjoys things and has interests. Plan: Continue Lithium  300 mg daily  Continue nortriptyline  75 mg daily Continue paroxetine  20 mg daily Continue lorazepam  0.5 mg 3 times daily for both anxiety and catatonia Continue vitamin B6 and primidone  for tremor Cerefolin NAC 2 daily.  05/13/2021 appointment with the following noted: Happy with [facility].  H and she agree is doing well with depression.Waynard Hailstone socialization. She complains of memory.   Family visited and getting along with Gregary Lean better.  H says Gregary Lean is acting better.  Goes to dinner and activities together now.  Better relationship with Gene.. Tremor has been OK.   Ativan  usually 0.25 mg TID and tolerated. Anxiety managed with this generally. Molly dog health more stable. To [place] before T'giving until April.   Plan: Continue Lithium  300 mg daily  Continue nortriptyline  75 mg daily Continue paroxetine  20 mg daily Continue lorazepam  0.5 mg 3 times daily for both anxiety and catatonia Continue vitamin B6 and primidone  for tremor   08/14/2021 appointment with the following noted: Seen with her husband [name] and nurse [name]. Everyone reports that  her mood has been stable.  Her anxiety is manageable with as needed lorazepam .  She is still happy with the transition to [facility].  They are preparing to go to [place]  for the winter per usual but may sell their house father there. Tolerating meds without any unusual side effects. Is dealing with grief over the loss of her dog Molly.  03/10/2022 appt noted:  seen with H and nurse Miki Alert [facility] house. [name] not good phsyically hard to play golf.  Been really hard. He'll be 85 in July.  Some stress scheduling with D's.   60 th wedding necessary this month.   Mood up and down.  Sister in law across the street can be difficult and demeaning.  She feels unsettled.  Some worry. Some back pain limiting activity.   Sleep is ok except with pain awakening.   No SE, except tremor some worse about 3-5 and helped by lorazepam  0.25 mg then No greater than 1 mg lorazepam  daily Patient denies any recent difficulty with anxiety except as noted.  Patient denies difficulty with sleep initiation or maintenance. Denies appetite disturbance.    Patient has some difficulty with concentration.  Patient denies any suicidal ideation.  Usually takes Ativan  at night and sleeps well 8 hours.   Plan no med changes  10/23/213 appt noted: Still doing well. Pneumonia and hosp for 6 days 3 weeks ago.  Viral.  Improving at this time. Increased tremor gradually in 3-4 mos helped by lorazepam  avg 1.00-1.25 mg daily. No change in primidone . Total R hip August 2023 and done well.  Finishing home PT.   Appetite poor for 7-8 mos.  2 protein drinks daily but may not eat more than 1 good meal a day. [facility]. Substantial memory problems in hospital and in unusual enviornments. Nurse notices decline.  B with LBD passed in August. No pain meds or muscle relaxants beyond pain. No depression.  H note sometimes she has less motivation than others. [name] notes some normal  waxing and waning of mood. Good social interaction.   Not  spending winter in [facility] this year.  D still lives there. Plan: Increase mirtazapine  15 mg HS for appetite.  Also used for sleep and depression.  09/23/22 appt noted:  with nurse and [name] [name] feeling bad with more pain and fatigue.  [name] has CHF.   Made Geniya sad.  Lost a cgood friends.  Rough time.  Mostly worry about [name].  She feels afraid to be alone if something happens.   Not sig depressed.    Just worry . H can't be as active.  Taking a shower is hard.   She's back exercising again.   Went to [employer] for Thanksgiving in [facility] and plan to go back for Christmas. It's hard to travel. Told nurse she was depressed. Appetite is better and once asleep is OK. Using more lorazepam  for anxiety 3-4 daily. Plan: Continue Lithium  300 mg daily  Continue nortriptyline  75 mg daily Continue paroxetine  20 mg daily Continue lorazepam  0.5 mg 3 times daily for both anxiety and catatonia Continue vitamin B6 and primidone  for tremor Increased and it helped mirtazapine  15 mg HS for appetite.  Also used for sleep and depression.  10/19/22  RTC  [name] reports not doing well.  Went to [facility].  Recent pneumonia.  They are thinking this is aspiration pneurmonia. Lethargic and anxious with tremor. Ruminating.  Hot to cold .  Low tolerance.  Going downhill quickly.  She's verbalizing depression and desire to isolate.  Won't go out.  Started within the last 2 weeks. Usually doesn't complain when she's sick.   [name]'s biggest concern is that Glee Lana is having trouble handling this bc she's up at night rambling and not really safe to be up at night.    Will see her 10/22/22 Get lithium  and nortriptyline  levels.   Nori Beat, MD, [facility]     10/22/22 emergency appt with nurse [name] and H [name]: Pneumonia Oct and then again 2nd week January.  It appears to have largely resoved.  It's really bad.  Nausea. Anxious bc [name] ill.  Doesn't want him to leave.  [name] sees anxiety triggering anxiety with little to no appetite.  Hypersensitive to  stimuli like light and noises.  Doesn't want TV on.  [name] says totally reversed in last 6 weeks.    [name] reports she's overemotional.  Friends died. Losing wt 91.4# today. More trouble with sleep. Lorazepam  increased to 2 mg daily.  Not sleepy with it.  Doesn't seem to be helpful. Last week neglect hygiene but better this week a little. [name] says STM is poor at times and at times disoriented if in [place].  Will walk in bathroom and wonder where sh is. Barium swallow pending for evaluation of refulex.    10/26/22 RTC:  RTC   No improvement since Friday.  Gradually less engaged.  Can be overstimulated with visitors.   Disc nortriptyline  level 115 is a little higher than it was and higher levels can cause SE in elderly.   Reduce nortriptyline  to 50 mg HS. Taking lorazepam    Consider low dose of Vraylar or pramipexole.  Or Spravato .    Nori Beat, MD, [facility]     11/15/22 appt noted: Dialogue with Dr. Mitzie Anda cardiologist agreed pt should be stable enough from CV perspective to receive Spravato  which could elevate BP.  If that occurs use prn metoprolol  25 mg. Disc in detail with Dr. [employer] and he agrees Spravato  is appropriate though not  FDA approved for bipolar depression.  Disc 2 metanalysis in support of this and this alernative is safer and likely better tolerated than the option of ECT and faster than options of trials of Trintellix , Auvelity, Latuda  etc. Tolerating meds.  She is depressed and very anxious and intermittently confused.  Taking clothes on and off bc rapid changes in comfort.  Worried over [employer].  Sleep disrupted appetite some better with mirtazapine . Received Spravato  56 mg first time today and was dissociated.  Resolved over 2 hours.  No HA, NV.  Was distressed moderately and very ambivalent about Tx.  H and nurse supportive of tx plan and for the pt.    11/24/22 appt noted: Current meds: nortriptyline  50 mg HS, paroxetine  20 mg daily, lithium  300 mg nightly, mirtazapine  30 mg  nightly, memantine  10 mg twice daily.  Lorazepam  0.5 mg every 4 to 6 hours as needed catatonia or severe anxiety. Received first dose of Spravato  84 mg today.  She was more noticeably dissociated than with the 56 mg dose.  It did resolve over the 2-hour course of observation although she remained somewhat unsteady and needed to leave via wheelchair.  She did not have headache nausea or vomiting.  She remains ambivalent about the treatment. Husband notes she has continued to be anxious and depressed and was extremely anxious yesterday and somewhat confused.  At times would say things that did not make sense.  He remained supportive of the treatment while she remains ambivalent about the treatment.  Her appetite is good and she is eating well.  Sleep is variable.  H reported at times will be almost normal after receiving an extra dose of lorazepam  0.5 mg prn but this only lasts an hour or so. Plan: Continue Lithium  300 mg daily  Continue nortriptyline  50 mg daily Continue paroxetine  20 mg daily Continue vitamin B6 and primidone  for tremor Imirtazapine 30 mg HS for appetite,  used for sleep and depression.  Appetite better Switch lorazepam  to Loreev ER 2 mg AM and use lorazepam  0.5 mg prn.  For catatonia in hopes of more consistent relief.  Decision based on observation of periods of almost normalcy after some BZ doses.  We discussed potential switch of paroxetine  and nortriptyline  to Auvelity or Trintellix  or possibly Latuda .  However these switches will be difficult and it is hoped that the Spravato  may provide enough improvement to allow a switch in medicine more tolerable.  11/26/22 appt noted: Current meds: nortriptyline  50 mg HS, paroxetine  20 mg daily, lithium  300 mg nightly, mirtazapine  30 mg nightly, memantine  10 mg twice daily.  Lorazepam  0.5 mg every 4 to 6 hours as needed catatonia or severe anxiety.  Started Loreev 2 mg daily over weekend Received Spravato  84 mg today.   Spravato  with expected  dissociation.  She didn't experience relief from negative emotion.  Dissociation resolved over observation period.  No N, V, HA.   Still high anxiety and dread of most everything.  Depressed with negative thought predominates.  H notes periods of confusion and then maybe an hour of clarity sometimes after the dosing of lorazepam .  Then back to severe sx.  Eating is fair. No SI.  Fearful generally.  Needs help with ADLs.  Cannot take meds on her own. Tolerating meds without SE No noticeable effect from Loreev in terms of benefit or SE  12/01/22 appt noted: Received a phone call from nurse [name] this morning to call as soon as possible this patient very agitated and  confused.  Thrashing around in bed.  At times talking about dying out of the fear that she was going to die.  Highly anxious.  Not very cooperative with drinking water  or taking medicines this morning. Discussed the situation with her husband as well and we have to options: 1's if we can get her to the office to receive Spravato  is scheduled today that may provide some immediate relief and forestall and may be prevent hospitalization.  The second option is to proceed directly to hospitalization.  If she is unable to take nutrition and hydration hospitalization will be necessary. Discussed that appeared to be catatonic hyperactivity and agitation which has not responded to the extended release lorazepam  2 mg a day.  Therefore she was given 1 mg of Ativan  this morning and did calm down enough to make it to the Spravato  administration this afternoon.  She was able to cooperate with Spravato  administration and tolerated it well with typical levels of dissociation which gradually resolved over the 2 hours.  She was able to eat and drink fluids prior to coming to the appointment. Current meds: nortriptyline  50 mg HS, paroxetine  20 mg daily, lithium  300 mg nightly,  memantine  10 mg twice daily.  Lorazepam  0.5 mg every 4 to 6 hours as needed catatonia or  severe anxiety.  Started Loreev 2 mg daily over weekend olanazpine 2.5 mg HS Received Spravato  84 mg today.   Husband notes that after Spravato  administration on Monday she was much improved for a period of several hours and was able to have conversations with her family members and make decisions about distributing some of her jewelry and other family decisions.  However over Monday evening into Tuesday she became more agitated and confused and anxious again.  There have been no obvious effects of taking the olanzapine  but it does not appear that long-acting lorazepam  2 mg as provided any significant relief. Plan: Continue Lithium  300 mg daily  Continue nortriptyline  50 mg daily Continue paroxetine  20 mg daily Continue vitamin B6 and primidone  for tremor DC mirtazapine  For catatonia: lorazepam0.5 mg q 4 hour mg daily as tolerated for catatonia with dose spread through the day..  Nurse manages. Restart Loreeve 2 mg daily and use prn lorazepam  for catatonia Increase olanzapine  5 mg HS for TRD, anxiety, catatonia, delusions of persecution  12/06/22 appt noted; Psych meds: as above except increase Loreev to 2 mg BID for severe catatonia. nortriptyline  50 mg HS, paroxetine   20 mg daily, lithium  300 mg nightly,  memantine  10 mg twice daily.  Lorazepam  0.5 mg every 4 to 6 hours as needed catatonia or severe anxiety.  Started Loreev 2 mg increased BID for severe catatonia,  increased olanazpine 5 mg HS for 3 days. Received 5th dose of Spravato  84 mg today.    She was able to cooperate with Spravato  administration and tolerated it well with typical levels of dissociation which gradually resolved over the 2 hours.  She was able to eat and drink fluids prior to coming to the appointment.   She still feels confused and depressed and anxious. Per family had periods over the weekend where she was confused thinking her H was dead even though he was right in front of her.  Also thinking she is going to die and not  get better.  Is eating and drinking fluids well.  Has needed extra lorazepam  to sleep.  Limited periods of lucidity per H. Tolerating high dose lorazepam  but limited benefit for catatonia. Plan: Continue Lithium   300 mg daily  reduce nortriptyline  25 mg daily in anticipation of change Reduce paroxetine  10 mg daily in anticipation of change Continue vitamin B6 and primidone  for tremor For catatonia: lorazepam0.5 mg q 4 hour mg daily as tolerated for catatonia with dose spread through the day..  Nurse manages. DT severity of catatonic hyperactivy Loreeve 2 mg BID and use prn lorazepam  for catatonia Continue olanzapine  5 mg HS for TRD, anxiety, catatonia, delusions of persecution  12/08/22 appt noted: Psych meds:  Loreev to 2 mg BID for severe catatonia. nortriptyline  25 mg HS, paroxetine  10 mg daily, lithium  300 mg nightly,  memantine  10 mg twice daily.  Lorazepam  0.5 mg every 4 to 6 hours as needed catatonia or severe anxiety.  Started Loreev 2 mg increased BID for severe catatonia,  increased olanazpine 5 mg HS for 3 days. Received 6th dose of Spravato  84 mg today.    She was able to cooperate with Spravato  administration and tolerated it well with typical levels of dissociation which gradually resolved over the 2 hours.  She was able to eat and drink fluids prior to coming to the appointment.   She still feels confused and depressed and anxious. Hopeless.  Thoughts she is going to die. Per nurse and H no sig improvement noted with spravato .   No current alcohol problems  01/14/2023 appointment on an urgent basis: Hospitalized due to psychiatry at [facility] from 12/10/2022 until 01/03/2023.  Hospitalized for bipolar disorder severe depression with catatonia.  Received ECT and Latuda  60 mg daily was started.  Stopped paroxetine , lithium , nortriptyline , and mirtazapine .  Lorazepam  was switched to clonazepam . Current psych meds clonazepam  0.5 mg twice daily, Latuda  60 mg daily.  Memantine  10 mg twice  daily, weekly ECT rec Wt 101# and better appetite.  Sleep not great but ok.  Will lie awake some.  H says it's pretty good. Alert in the day.  No SE with meds.  Tremor better.   Lorazepam  only on ECT days. Plan:  For catatonia: Continue clonazepam  0.5 mg twice daily also for anxiety Continue Latuda  60 mg daily Continue lorazepam  0.5 mg as needed which is currently being used rarely. Recommend continue ECT  weekly as [facility] recommends  02/22/23    Murvin Arthurs nurse called at 11am.  Kasumi is doing better, receiving ECT twice this week. Not having breakthrough anxiety now and [name] wants to decrease her morning dose of Klonopin  to help decrease her sedation. She wants to give her 1/2 of 0.5mg  Klonopin  in the am and just give her regular dose the other two times a day. Is this okay.        Electronically signed by Gregg Leap L at 02/22/2023 11:21 AM  03/11/23 appt:  Current psych med:  clonazepam  0.25 mg AM and 0.5 mg HS on 5/21, Latuda  60,  Worry anxiety.  Not as happy as those around here. Reduced ECT to weekly does not maintain the benefit.  But they didn't want to continue it.   [name] has  talked with ECT doc about holding off on ECT and missed dose but she's gotten more depressed.   Did twice weekly for awhle and it was beneficial.  Sched for Tuesday Plan: Start Trintellix  off label for dep.  Trying to find solution that would allow stopping ECT Recommend continue ECT  but increase to twice weekly as [facility] recommends  04/15/23 appt noted:  with nurse and H Reduced clonazepam  to 0.25 mg AM and 0.5 mg HS,  Latuda  60, incr Trintellix  10 ECT once or twice weekly.  Took 13 day holiday with ECT and progressively downhill.  Worse if goes more than a week without ECT.   Some interest but not much initiative.  Read paper some.  Can initiate conversation with nurse with some recent details.  Still other memory problems frustrating to everyone.   Some memory px even before ECT but  worse. Sleep good.  Feels good today and appetite ok.   D been at the house lately.  D from [place] No excessive sleep.   She naps some easily in the day.  No sig changes with less clonazepam .  Minimal effect from counseling DT memory. No new concerns.   No SE.    05/20/23 appt noted: Tolerated increase in Trintellix  to 15 mg daily.  Reduced clonazepam  0.25 mg AM and 0.375 mg HS. No prn lorazepam .  No dep verbalized since here.  Trouble with sleep.  Intial insomnia.  No caffeine .  Some napping.  Not drowsy now.  Will lay on couch after breakfast.   Not anxious about anything.  Sleep trouble in the last few days. Last ECT a little over 2 weeks to wait another week if possible.  H thinks she is more awake with less ECT.  Will spontaneously seek out paper and fix several. Where do we go from here?   Per H .  Wants her to be able to stop ECT. Nurse notes better exec function.  Nurse has seen some lack of motivation and little spontaneous speech.  Won't engage in conversation much per family.   Still some rumination.   She has no other complaints other than some physical concerns.  Plan: For catatonia: clonazepam  to 0.25 mg AM and 0.375 mg HS for 2 weeks then 0.25 mg BID for cog reasons and not currently catatonic Continue Latuda  60 mg daily Continue lorazepam  0.5 mg as needed which is currently being used rarely. Incr Trintellix  to 20 mg daily off label for bipolar dep.  Trying to find solution that would allow stopping ECT Recommend continue ECT  but increase to twice weekly as [facility] recommends but considering reduction to every 3 weeks is reasonable.  She is not dep now.  06/24/23 appt noted: urgent appt with H and nurse. Tolerated increase in Trintellix  to 20 mg daily.  Reduced clonazepam  0.25 mg AM and 0.375 mg HS. No prn lorazepam , mirtazapine  7.5 mg HS, Latuda  60.  Memantine  10 BID Last eCT Monday every 3-3&1/2 weeks.   Mood and anxiety dipped with time away from ECT.   ECT still helpful.   Away from ECT harder to read and watch TV.   [facility] wants 2 week interval. Minimal initiation of conversation with others.   Not sure what to say in conversation.   General malaise over the last 10 days. Appetite is fair.  D Mariah Shines came last weekend and was helpful.  Not dep today but was before ECT this week.  Meds have not prevented recurrence. H asks about restarting lithium  which helped in the past.  07/25/23 appt : seen with nurse Last ECT 07/19/23.   Lately weekly bc sx got worse.  Nurse has seen improvement.  [facility] saying it is helpful. Added Concerta  18 mg AM.  No SE.  Nurse noticed a little more attempt to conc but not a lot of benefit.   No prn lorazepam  needed.   H says she's sleepy a lot of the time.  Nurse suspects clonazepam  as  the source of sleepiness. No benefit with more clonazepam . Sleep helped by mirtazapine  7.5 mg HS. No SE with Trintellix  20 mg  Apppetite pretty normal.   3 days without primidone  but tremor noticeably worse. Planning to go to [facility]. H using cane and less mobile. Has assistance at home for parts of the day.  Uses Rolator at night when gets up to urinate.  08/26/23 appt noted:  with nurse meds No sig dep nor anxiety.  Sleep good.  Untreated OSA per family.   Per nurse more effort with things with increase concerta  27 AM.  No SE.   Weekly ECT ongoing.  Will be 11 days . Going to [facility] for a week for T'giving.  Looking forward to seeing g'kids.   No prn lorazepam  needed.   Has pacemaker for heart rate.   ECT clearly helping.   H agrees gradually improving.    Appetite ok.  Wt consistent 97-99#.   Neuro FU for eval cog 09/14/23.   Needs help with ADLs but less than before.   Plan: increase off label Concerta  DT no sig change and no SE at 27 mg so increase to 36 mg AM for TRD and cog problems  10/14/23 appt noted:  with nurse.   Psych meds: Clonazepam  0.25  nightly for catatonia, lorazepam  0.5 mg as needed he anxiety or insomnia, Latuda   60 mg, memantine  10 mg twice daily, Concerta  36 mg every morning, primidone  100 every morning and 50 nightly for tremor.,  Trintellix  20 mg daily. No difference with increase Concerta .  No benefit.   Episode accidental taking clonazepam  2 mg HS and excessively drowsy for 3 hours.   Neuro reported potential SZ activity on EEG.  Plans 24 hour EEG to evaluate.  She & H had Covid and missed ECT for a month.  When missed ECT was worse with less engagement, lethargy, poor appetite and lack of initiation.  Better with resuming ECT.   Good evening last night and went out to dinner.   Cindy here from [place].  Last ECT 10/03/23 and then 10/11/23 and next will be 10/18/23.  Only weekly ECT until fully better then back to every other day. Now dep better again with ECT and less distressed.   Increase stress and anxiety with [name] with lymphoma recurrence.   Appetite OK now.   Plan: DC Concerta  DT NR  01/02/24 appt noted:  no show for appt Per Dr. Ty Gales: 10/24/23 1 hour EEG: Her 1-hour EEG in 10/2023 showed occasional right temporal focal slowing and occasional right frontotemporal epileptiform discharges. She had a 24-hour ambulatory EEG in February which showed occasional bifrontal spikes, maximal over the right frontopolar region, independent sharp waves over the bilateral temporal regions with triphasic morphology. No electrographic seizures seen.  Jhonny Moss, MD    12/20/23 12:32 PM Note Spoke to patient's psychiatrist at [facility], Dr. Wenona Hamilton about her EEG. She has not had any clinical symptoms of seizure that can be determined. We discussed treating EEG versus treating patient, and agree that since she has not had any clinical symptoms, close clinical monitoring off seizure medication will be beneficial. He reviewed her prior visits and notes that ECT has been very helpful and lifesaving for [place], and that starting seizure medication would affect ECT treatment, but if there is no other option, they can work  around it. Agreed to monitor patient, we will look into starting a different headache prophylactic medication that is not an anti-seizure  medication.      02/10/24 appt noted: Med: Trintellix  20, Latuda  60 mg daily, mirtazapine  7.5 mg HS, clonazepam  0.25 mg BID, Memantine  10 BID, lorazepam  0.5 mg about once per week.   Worsening HA over last couple of mos.  Daily.   ECT every 3 weeks.  Overall better 2 weeks after ECT and then down more the next week.   No HA today and no anxiety.   Appetite good 96# stable.  Some walking daily.  No unusual fear.   No clear SE.   Out socially once or twice weekly.   H notes cognition will be clear cognitively and do well for a day or 2 and then 90% worse for no reason.     ECT-MADRS    Flowsheet Row Clinical Support from 12/08/2022 in [facility]  MADRS Total Score 54      Mini-Mental    Flowsheet Row Office Visit from 12/07/2023 in [facility]  Total Score (max 30 points ) 28      PHQ2-9    Flowsheet Row Office Visit from 01/14/2023 in [facility] Office Visit from 10/11/2022 in [facility] Office Visit from 08/17/2022 in [facility] Office Visit from 07/19/2022 in [facility] Office Visit from 03/24/2022 in [facility]  PHQ-2 Total Score 4 6 0 0 1  PHQ-9 Total Score 14 -- -- -- --      Flowsheet Row ED to Hosp-Admission (Discharged) from 07/01/2022 in [place] [facility] Admission (Discharged) from 05/18/2022 in [place] [facility] Pre-Admission Testing 60 from 05/10/2022 in [facility] TEST[facility]  C-SSRS RISK CATEGORY No Risk No Risk No Risk      Past Psychiatric Medication Trials:  Prozac  40 with lithium  and Zyprexa  2.5 to 5 mg,  nortriptyline , paroxetine ,  Effexor 225 Wellbutrin 300 mirtazapine ,  Trintellix  20 Mirtazapine  7.5 Others  Pramipexole 0.625 daily hallucinations  Rexulti EPS, Abilify  15, Vraylar 1.5 1 week,  Latuda  60 mg  Olanzapine  10 briefly Caplyta  briefly no response   Lamotrigine level 7.5  Namenda ,  lorazepam ,  Clonazaam 0.5 BID  no response Spravato  after 6 tx of 84 mg and 2 of 56 mg .  Hospitalized at [facility] on psychiatric unit from 12/10/2022 to 01/03/2023 and received ECT and started Latuda  60 mg daily.  Stopped paroxetine , nortriptyline , and lithium .  this list is not exhaustive  Review of Systems:  Review of Systems  Constitutional:  Positive for fatigue. Negative for appetite change.  Cardiovascular:  Negative for palpitations.  Gastrointestinal:  Negative for nausea.       Linzess  and Mozantic managed   Musculoskeletal:  Positive for gait problem.  Neurological:  Positive for dizziness, weakness and headaches. Negative for tremors.       Some balance issues  Psychiatric/Behavioral:  Positive for confusion and decreased concentration. Negative for agitation, behavioral problems, dysphoric mood, hallucinations, self-injury, sleep disturbance and suicidal ideas. The patient is not nervous/anxious and is not hyperactive.     Medications: I have reviewed the patient's current medications.  Current Outpatient Medications  Medication Sig Dispense Refill   amLODipine  (NORVASC ) 10 MG tablet Take 1 tablet (10 mg total) by mouth daily. 90 tablet 1   aspirin  81 MG chewable tablet Chew 1 tablet (81 mg total) by mouth daily.     Calcium  Carbonate-Vitamin D  (CALCIUM  600/VITAMIN D  PO) Take 1 tablet by mouth 2 (two)  times daily.     cetirizine (ZYRTEC) 10 MG tablet Take 10 mg by mouth as needed for allergies.     Cholecalciferol  (VITAMIN D3) 50 MCG (2000 UT) TABS Take 2,000 Units by mouth in the morning.     clonazePAM  (KLONOPIN ) 0.5 MG tablet Take 1 tablet (0.5 mg total) by mouth 2 (two) times daily. (Patient taking differently: Take 0.25 mg by mouth 2 (two) times daily.) 180 tablet 0   cloNIDine  (CATAPRES ) 0.1 MG tablet Take 1 tablet (0.1 mg total) by  mouth as needed prior to ECT treatment 20 tablet 2   dexlansoprazole  (DEXILANT ) 60 MG capsule Take 1 capsule (60 mg total) by mouth daily. 90 capsule 3   glycerin  adult 2 g suppository Use 1 suppository as needed if you have not had a bowel movement in  4 to 5 days 25 suppository 0   hydrochlorothiazide  (HYDRODIURIL ) 12.5 MG tablet Take 1 tablet (12.5 mg total) by mouth daily. 90 tablet 1   Krill Oil 500 MG CAPS Take 500 mg by mouth daily.     lactulose  (CHRONULAC ) 10 GM/15ML solution Take 30 mLs (20 g total) by mouth 3 (three) times daily between meals as needed for mild constipation. (Patient taking differently: Take 20 g by mouth daily as needed for mild constipation.) 473 mL 3   Lavender Oil 80 MG CAPS Take 160 mg by mouth at bedtime.     levothyroxine  (SYNTHROID ) 75 MCG tablet Take 1 tablet (75 mcg total) by mouth daily. 90 tablet 2   levothyroxine  (SYNTHROID ) 75 MCG tablet Take 1 tablet (75 mcg total) by mouth daily. 90 tablet 2   linaclotide  (LINZESS ) 72 MCG capsule Take 2 capsules (145 mcg total) by mouth daily before breakfast. 180 capsule 1   LORazepam  (ATIVAN ) 0.5 MG tablet TAKE 2 TABLETS (1 MG)  BY MOUTH EVERY NIGHT AT BEDTIME AND 1 TABLET EVERY 4 HOURS AS NEEDED FOR ANXIETY 270 tablet 0   losartan  (COZAAR ) 100 MG tablet Take 1 tablet (100 mg total) by mouth daily. 90 tablet 1   magic mouthwash w/lidocaine  SOLN Take 5 mLs by mouth 3 (three) times daily as needed for mouth pain. 120 mL 0   memantine  (NAMENDA ) 10 MG tablet Take 1 tablet (10 mg total) by mouth 2 (two) times daily. 180 tablet 1   metoprolol  tartrate (LOPRESSOR ) 25 MG tablet Take 1 tablet (25 mg total) by mouth as needed. 45 tablet 0   mirtazapine  (REMERON ) 7.5 MG tablet Take 1 tablet (7.5 mg total) by mouth at bedtime. 90 tablet 0   Multiple Vitamins-Minerals (MULTIVITAMIN WITH MINERALS) tablet Take 1 tablet by mouth daily.     Multiple Vitamins-Minerals (PRESERVISION AREDS 2 PO) Take 1 capsule by mouth in the morning and at  bedtime.     neomycin -polymyxin b-dexamethasone  (MAXITROL ) 3.5-10000-0.1 OINT Apply thin line (1 cm) of ointment to right eye twice daily for 1 week. (Patient taking differently: as needed.) 3.5 g 1   neomycin -polymyxin-dexameth (MAXITROL ) 0.1 % OINT apply a small amount into the affected eye twice a day for 1 week (Patient taking differently: as needed. apply a small amount into the affected eye twice a day for 1 week) 3.5 g 1   ofloxacin  (OCUFLOX ) 0.3 % ophthalmic solution Instill 1 drop into affected eye three times daily for 5 days. 5 mL 3   omeprazole -sodium bicarbonate  (ZEGERID ) 40-1100 MG capsule Take 1 capsule by mouth daily before breakfast. (Patient taking differently: Take 1 capsule by mouth as needed.) 90 capsule  3   ondansetron  (ZOFRAN -ODT) 4 MG disintegrating tablet Place 1 tablet every 6 hours by translingual route as needed. 20 tablet 0   polyethylene glycol (MIRALAX  / GLYCOLAX ) 17 g packet Take 17 g by mouth as needed for mild constipation.     primidone  (MYSOLINE ) 50 MG tablet Take 2 tablets (100 mg total) by mouth every morning AND 1 tablet (50 mg total) at bedtime. 270 tablet 0   sennosides-docusate sodium  (SENOKOT-S) 8.6-50 MG tablet Take 2 tablets by mouth as needed.     thiamine  (VITAMIN B-1) 100 MG tablet Take 1 tablet (100 mg total) by mouth daily. 100 tablet 3   valACYclovir  (VALTREX ) 1000 MG tablet Take 2 tablets (2,000 mg total) by mouth at onset, then take 2 tablets 12 hours later. (Patient taking differently: Take 2,000 mg by mouth as needed.) 30 tablet 1   vitamin B-12 (CYANOCOBALAMIN ) 1000 MCG tablet Take 1,000 mcg by mouth at bedtime.     vortioxetine  HBr (TRINTELLIX ) 20 MG TABS tablet Take 1 tablet (20 mg total) by mouth daily. 90 tablet 0   lurasidone  (LATUDA ) 40 MG TABS tablet Take 1 tablet (40 mg total) by mouth daily with breakfast. 30 tablet 0   Rimegepant Sulfate  (NURTEC) 75 MG TBDP Take 1 tablet (75 mg total) by mouth every other day for headache prevention 16  tablet 5   No current facility-administered medications for this visit.    Medication Side Effects: Other: tremor stable.  No worse.  Allergies:  Allergies  Allergen Reactions   Propranolol Other (See Comments)    Low blood pressure. Bradycardia    Propranolol Hcl Other (See Comments)   Brexpiprazole Other (See Comments)    Aphasia and catatonia    Past Medical History:  Diagnosis Date   Anxiety    Arthritis    "hips, spine" (03/17/2018)   Bipolar II disorder (HCC)    CHF (congestive heart failure) (HCC)    Chronic bronchitis (HCC)    Chronic lower back pain    Chronic right hip pain    CKD (chronic kidney disease), stage II    Coronary artery disease    stent x1   Esophagitis, erosive    GAD (generalized anxiety disorder)    GERD (gastroesophageal reflux disease)    Headache    "maybe monthly" (03/17/2018))   Heart murmur, systolic    History of adenomatous polyp of colon    08-04-2016  tubular adenoma   History of blood transfusion 12/2017   "related to vascular hematoma"   History of electroconvulsive therapy    at [facility]--  started 04-15-2015 to 11-17-2016  total greater than 40 times   History of hiatal hernia    Hyperlipidemia    Hypertension    Hypothyroidism    Internal carotid artery stenosis, bilateral    per last duplex 05-01-2014  bilateral ICA 40-59%   Major depression, chronic    ECT treatments extensive and multiple started 07/ 2016   Memory loss    "both short and long-term; needs frequent reminders to follow instrucitons" (05/16/2017)   Migraines    "none in years" (03/17/2018)   OSA (obstructive sleep apnea)    per study 06/ 2012 moderate OSA  ; "refuses to wear masks" (03/17/2018)   Osteoporosis    Pneumonia 07/29/2022   Poor historian    due to short term memory loss   Presence of permanent cardiac pacemaker 03/17/2018   Pulmonary nodule    monitored by pcp   S/P placement  of cardiac pacemaker 03/17/18 [facility]  03/18/2018   Short-term memory  loss    Sick sinus syndrome (HCC)     Family History  Problem Relation Age of Onset   Heart attack Father 36       deceased   Hypertension Father    Heart disease Father    Dementia Brother    Parkinson's disease Brother    Heart disease Brother    Breast cancer Paternal Aunt        Age 50's   Breast cancer Paternal Grandmother        Age unknown   Colon cancer Neg Hx     Social History   Socioeconomic History   Marital status: Married    Spouse name: Dr. Nelson Bandy   Number of children: 2   Years of education: 15   Highest education level: Not on file  Occupational History   Occupation: housewife    Employer: UNEMPLOYED  Tobacco Use   Smoking status: Former    Current packs/day: 0.00    Average packs/day: 2.0 packs/day for 15.0 years (30.0 ttl pk-yrs)    Types: Cigarettes    Start date: 01/13/1956    Quit date: 01/13/1971    Years since quitting: 53.1    Passive exposure: Never   Smokeless tobacco: Never  Vaping Use   Vaping status: Never Used  Substance and Sexual Activity   Alcohol use: Yes    Comment: occassional 1 x a week   Drug use: Never   Sexual activity: Not Currently    Comment: intercourse age 28, sexual partners less than 5  Other Topics Concern   Not on file  Social History Narrative   Right handed   Drinks caffeine  no   Single home at [employer]   Social Drivers of Health   Financial Resource Strain: Low Risk  (12/12/2022)   Received from [facility], [facility]   Overall Financial Resource Strain (CARDIA)    Difficulty of Paying Living Expenses: Not hard at all  Food Insecurity: No Food Insecurity (12/12/2022)   Received from [facility], [facility]   Hunger Vital Sign    Worried About Running Out of Food in the Last Year: Never true    Ran Out of Food in the Last Year: Never true  Transportation Needs: No Transportation Needs (12/12/2022)   Received from [facility], [facility]   [facility] - Transportation    In the past 12 months, has lack of transportation kept you from medical appointments or from getting medications?: No    Lack of Transportation (Non-Medical): No  Physical Activity: Not on file  Stress: Not on file  Social Connections: Not on file  Intimate Partner Violence: Not At Risk (07/06/2022)   Humiliation, Afraid, Rape, and Kick questionnaire    Fear of Current or Ex-Partner: No    Emotionally Abused: No    Physically Abused: No    Sexually Abused: No    Past Medical History, Surgical history, Social history, and Family history were reviewed and updated as appropriate.   Please see review of systems for further details on the patient's review from today.   Objective:   Physical Exam:  LMP  (LMP Unknown)   Physical Exam Constitutional:      General: She is not in acute distress.    Appearance: Normal appearance. She is well-developed.  Musculoskeletal:  General: No deformity.  Neurological:     Mental Status: She is alert and oriented to person, place, and time.     Motor: Weakness present. No tremor.     Coordination: Coordination abnormal.     Comments: Cane use  Psychiatric:        Attention and Perception: She is attentive. She does not perceive auditory hallucinations.        Mood and Affect: Mood is anxious. Mood is not depressed. Affect is not labile, blunt or inappropriate.        Speech: Speech is not slurred.        Behavior: Behavior normal. Behavior is not slowed, withdrawn or combative.        Thought Content: Thought content is not delusional. Thought content does not include homicidal or suicidal ideation. Thought content does not include suicidal plan.        Cognition and Memory: Cognition is impaired. She exhibits impaired recent memory.        Judgment: Judgment normal.     Comments: Insight fair affected by STM px. No auditory or visual hallucinations.   Depression resolved.  anxiety remarkably improved Delusional guilt not elicited.  No specific fears elicited other than H's health. Affect pleasant and smiling today. better spontaneous speech     Lab Review:     Component Value Date/Time   NA 139 02/16/2024 1120   NA 137 12/29/2022 0000   K 3.7 02/16/2024 1120   CL 104 02/16/2024 1120   CO2 27 02/16/2024 1120   GLUCOSE 113 (H) 02/16/2024 1120   BUN 28 (H) 02/16/2024 1120   BUN 12 12/29/2022 0000   CREATININE 0.86 02/16/2024 1120   CALCIUM  9.5 02/16/2024 1120   PROT 6.5 02/16/2024 1120   PROT 6.2 04/20/2017 1151   ALBUMIN 3.9 09/02/2022 1221   ALBUMIN 3.9 04/20/2017 1151   AST 16 02/16/2024 1120   ALT 20 02/16/2024 1120   ALKPHOS 64 09/02/2022 1221   BILITOT 0.3 02/16/2024 1120   BILITOT <0.2 04/20/2017 1151   GFRNONAA >60 07/08/2022 0429   GFRNONAA 50 (L) 07/10/2020 1021   GFRAA 58 (L) 07/10/2020 1021       Component Value Date/Time   WBC 8.3 02/16/2024 1120   RBC 3.81 02/16/2024 1120   HGB 11.6 (L) 02/16/2024 1120   HGB 10.2 (L) 04/20/2017 1151   HCT 35.3 02/16/2024 1120   HCT 29.6 (L) 04/20/2017 1151   PLT 249 02/16/2024 1120   PLT 385 (H) 04/20/2017 1151   MCV 92.7 02/16/2024 1120   MCV 89 04/20/2017 1151   MCH 30.4 02/16/2024 1120   MCHC 32.9 02/16/2024 1120   RDW 13.2 02/16/2024 1120   RDW 13.9 04/20/2017 1151   LYMPHSABS 2,310 06/27/2023 1242   LYMPHSABS 1.5 04/20/2017 1151   MONOABS 0.7 07/07/2022 0458   EOSABS 58 02/16/2024 1120   EOSABS 0.3 04/20/2017 1151   BASOSABS 42 02/16/2024 1120   BASOSABS 0.0 04/20/2017 1151    Lithium  Lvl  Date Value Ref Range Status  10/21/2022 0.5 (L) 0.6 - 1.2 mmol/L Final  10/21/2022 lithium  level 0.5 on 300 mg nightly is stable.,  Nortriptyline  115 on 75 mg HS.  08/02/22 nortriptyline  85, lihtium 0.5  Nortriptyline  level 51 on 50 mg nightly with paroxetine  20 mg daily  07/10/2020 lithium  level 0.6 at [facility] office and nortriptyline  level 130 on the  current dosages of lithium  300 mg nightly and nortriptyline  75 mg nightly  02/20/2021 labs lithium  0.6 on  300 mg nightly.  Nortriptyline  level 49 which is lower than expected on 75 mg nightly along with paroxetine  20 mg daily.    No results found for: "PHENYTOIN", "PHENOBARB", "VALPROATE", "CBMZ"   .res Assessment: Plan:    Generalized anxiety disorder  Severe bipolar I disorder, current or most recent episode depressed, with catatonia (HCC)  Memory impairment  Insomnia due to mental condition  Essential tremor    History of lithium  toxicity repeatedly while on chlorthalidone . No tremor px off of the lithium .    30-minute visit today with  nurse present..  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  Recent decompensation and rapidly getting worse with some cognive impairment and mild psychotic sx and altered mental status. Her case has been highly complex and treatment resistant at times.  No current catatonia but is somewhat anxious easily but only with changes.  No lorazepam  needed.Aaron Aas  And worse with reduction of clonazepam  without improvement in cog with reduction. So  increased it back to 0.5 mg BID but balance issues and sleepiness so reducing back to 0.25 mg BID.  Then reduced to 0.25 mg HS without cog benefit or alertness or energy  and more HA so increase back to 0.25 mg BID   She had remarkedly improved with the ECT + change in psychiatric medicine to Latuda  60 mg daily with clonazepam  0.5 mg twice daily. But they would like to get her meds to the point she could stop ECT bc cog problems which are ongoing.  She is not ruminative.  Affect is bright.  Dep resolved including anhedonia largely with ECT.  Catatonia is resolved .  Cog is better with skipping a couple of weeks of ECT but is not normal with STM px and not very talkative socially  she can recall events of the last few days..   She is still anxious and somewhat insecure.  Short-term memory impairment is  consistent with what would be expected with the ECT at this time. [facility] weekly [facility].  options of trials of ultra-low dose lithium  Auvelity, etc. Reviewed the old chart and consider very low-dose Vraylar   Previously Disc Dr. Eligah Grow concerns about longterm ECT on cognition. Seeing neuro for work up of potential SZ episodes. 12/07/23 Neuro Dr. Rayfield Cairo reviewed EEG with the following:  Her routine and 24-hour EEG were abnormal with spikes in the bifrontal regions (maximal at right frontopolar), bilateral temporal sharp waves with triphasic morphology. She has not had any convulsive seizures, there have been periods of catatonia lasting for several days which would be unusual for seizure, however post-ictal psychosis is a consideration.  She is in a much better state today, MMSE 28/30 which is a significant improvement compared to prior visits/testing.   For catatonia and anxiety and HA increase clonazepam  back to 0.25 mg AM and 0.25 mg PM .  There was no benefit with less including cognition.  Continue lorazepam  0.5 mg as needed which is currently being used rarely. continue Trintellix  to 20 mg daily off label for bipolar dep.    Reduce  Latuda  40 mg daily for mental and physical clarity  We discussed the short-term risks associated with benzodiazepines including sedation and increased fall risk among others.  Discussed long-term side effect risk including dependence, potential withdrawal symptoms, and the potential eventual dose-related risk of dementia.  But recent studies from 2020 dispute this association between benzodiazepines and dementia risk. Newer studies in 2020 do not support an association with dementia.  Discussed potential metabolic side effects associated with atypical antipsychotics, as well as potential risk for movement side effects. Advised pt to contact office if movement side effects occur.   Recommend continue ECT  weekly as [facility] recommends.  She is not dep  now but it tends to recur between ECT if treatments are spread out.  Consider cytomel augmentation for cognition and mood.  FU 4 weeks  Nori Beat MD, DFAPA Please see After Visit Summary for patient specific instructions.  Future Appointments  Date Time Provider Department Center  03/09/2024 11:00 AM Cottle, Kennedy Peabody., MD CP-CP None  03/14/2024 10:15 AM Phyllis Breeze, MD [facility] None  03/21/2024 12:30 PM [facility]ECHO/VAS [facility]  03/23/2024 10:30 AM Jhonny Moss, MD [facility] None  04/02/2024  7:00 AM [facility] HVT DEVICE REMOTES [facility] H&V  07/02/2024  7:00 AM [facility] HVT DEVICE REMOTES [facility] H&V  09/24/2024 10:10 AM [facility]-LAB [facility]  10/01/2024  7:00 AM [facility] H[facility] DEVICE REMOTES [facility] H&V  10/01/2024 10:00 AM Sylvan Evener, MD [facility]JB  12/31/2024  7:00 AM [facility] HVT DEVICE REMOTES [facility]  04/01/2025  7:00 AM [facility] HVT DEVICE REMOTES [facility]V  07/01/2025  7:00 AM [facility] HVT DEVICE REMOTES [facility]  09/30/2025  7:00 AM [facility]VT DEVICE REMOTES [facility] H[facility]V      No orders of the defined types were placed in this encounter.      -------------------------------

## 2024-02-11 ENCOUNTER — Other Ambulatory Visit (HOSPITAL_BASED_OUTPATIENT_CLINIC_OR_DEPARTMENT_OTHER): Payer: Self-pay

## 2024-02-13 ENCOUNTER — Telehealth: Payer: Self-pay | Admitting: Psychiatry

## 2024-02-13 ENCOUNTER — Other Ambulatory Visit: Payer: Self-pay

## 2024-02-13 ENCOUNTER — Other Ambulatory Visit (HOSPITAL_BASED_OUTPATIENT_CLINIC_OR_DEPARTMENT_OTHER): Payer: Self-pay

## 2024-02-13 MED ORDER — LURASIDONE HCL 40 MG PO TABS
40.0000 mg | ORAL_TABLET | Freq: Every day | ORAL | 0 refills | Status: DC
  Filled 2024-02-13: qty 30, 30d supply, fill #0

## 2024-02-13 NOTE — Telephone Encounter (Signed)
 [name], nurse, called and LM at 11:02 indicating that at Meghan Oliver's appt Friday you wanted to decrease her Latuda  to 40mg  and wanted it done right way.  The pharmacy doesn't have a prescription for the 40mg .  Please send to  [facility]   Next appt 6/6

## 2024-02-13 NOTE — Telephone Encounter (Signed)
 Pended Latuda  40 mg, discontinued 60 mg.

## 2024-02-16 ENCOUNTER — Encounter: Payer: Self-pay | Admitting: Internal Medicine

## 2024-02-16 ENCOUNTER — Other Ambulatory Visit (HOSPITAL_BASED_OUTPATIENT_CLINIC_OR_DEPARTMENT_OTHER): Payer: Self-pay

## 2024-02-16 ENCOUNTER — Ambulatory Visit: Admitting: Internal Medicine

## 2024-02-16 ENCOUNTER — Other Ambulatory Visit (HOSPITAL_COMMUNITY): Payer: Self-pay

## 2024-02-16 ENCOUNTER — Encounter: Payer: Self-pay | Admitting: Neurology

## 2024-02-16 VITALS — BP 100/60 | HR 63 | Ht 58.5 in | Wt 99.5 lb

## 2024-02-16 DIAGNOSIS — K5909 Other constipation: Secondary | ICD-10-CM

## 2024-02-16 DIAGNOSIS — R519 Headache, unspecified: Secondary | ICD-10-CM

## 2024-02-16 DIAGNOSIS — F418 Other specified anxiety disorders: Secondary | ICD-10-CM | POA: Diagnosis not present

## 2024-02-16 DIAGNOSIS — E039 Hypothyroidism, unspecified: Secondary | ICD-10-CM

## 2024-02-16 DIAGNOSIS — R413 Other amnesia: Secondary | ICD-10-CM

## 2024-02-16 DIAGNOSIS — G8929 Other chronic pain: Secondary | ICD-10-CM

## 2024-02-16 DIAGNOSIS — Z9882 Breast implant status: Secondary | ICD-10-CM

## 2024-02-16 DIAGNOSIS — F319 Bipolar disorder, unspecified: Secondary | ICD-10-CM | POA: Diagnosis not present

## 2024-02-16 DIAGNOSIS — F3181 Bipolar II disorder: Secondary | ICD-10-CM

## 2024-02-16 DIAGNOSIS — R251 Tremor, unspecified: Secondary | ICD-10-CM

## 2024-02-16 DIAGNOSIS — Z96641 Presence of right artificial hip joint: Secondary | ICD-10-CM

## 2024-02-16 DIAGNOSIS — E7800 Pure hypercholesterolemia, unspecified: Secondary | ICD-10-CM

## 2024-02-16 DIAGNOSIS — I10 Essential (primary) hypertension: Secondary | ICD-10-CM | POA: Diagnosis not present

## 2024-02-16 DIAGNOSIS — Z950 Presence of cardiac pacemaker: Secondary | ICD-10-CM | POA: Diagnosis not present

## 2024-02-16 DIAGNOSIS — T43595D Adverse effect of other antipsychotics and neuroleptics, subsequent encounter: Secondary | ICD-10-CM | POA: Diagnosis not present

## 2024-02-16 DIAGNOSIS — Z8659 Personal history of other mental and behavioral disorders: Secondary | ICD-10-CM

## 2024-02-16 DIAGNOSIS — E785 Hyperlipidemia, unspecified: Secondary | ICD-10-CM

## 2024-02-16 DIAGNOSIS — R5383 Other fatigue: Secondary | ICD-10-CM

## 2024-02-16 DIAGNOSIS — G251 Drug-induced tremor: Secondary | ICD-10-CM

## 2024-02-16 DIAGNOSIS — R5381 Other malaise: Secondary | ICD-10-CM | POA: Diagnosis not present

## 2024-02-16 NOTE — Telephone Encounter (Signed)
 PA currently pending. Will update when receive a determination

## 2024-02-16 NOTE — Telephone Encounter (Signed)
 Pt caregiver is asking for an update on NUrtec PA

## 2024-02-17 ENCOUNTER — Other Ambulatory Visit (HOSPITAL_BASED_OUTPATIENT_CLINIC_OR_DEPARTMENT_OTHER): Payer: Self-pay

## 2024-02-17 ENCOUNTER — Ambulatory Visit: Payer: Self-pay | Admitting: Internal Medicine

## 2024-02-17 ENCOUNTER — Other Ambulatory Visit: Payer: Self-pay

## 2024-02-17 LAB — COMPLETE METABOLIC PANEL WITHOUT GFR
AG Ratio: 1.5 (calc) (ref 1.0–2.5)
ALT: 20 U/L (ref 6–29)
AST: 16 U/L (ref 10–35)
Albumin: 3.9 g/dL (ref 3.6–5.1)
Alkaline phosphatase (APISO): 44 U/L (ref 37–153)
BUN/Creatinine Ratio: 33 (calc) — ABNORMAL HIGH (ref 6–22)
BUN: 28 mg/dL — ABNORMAL HIGH (ref 7–25)
CO2: 27 mmol/L (ref 20–32)
Calcium: 9.5 mg/dL (ref 8.6–10.4)
Chloride: 104 mmol/L (ref 98–110)
Creat: 0.86 mg/dL (ref 0.60–0.95)
Globulin: 2.6 g/dL (ref 1.9–3.7)
Glucose, Bld: 113 mg/dL — ABNORMAL HIGH (ref 65–99)
Potassium: 3.7 mmol/L (ref 3.5–5.3)
Sodium: 139 mmol/L (ref 135–146)
Total Bilirubin: 0.3 mg/dL (ref 0.2–1.2)
Total Protein: 6.5 g/dL (ref 6.1–8.1)

## 2024-02-17 LAB — CBC WITH DIFFERENTIAL/PLATELET
Absolute Lymphocytes: 1585 {cells}/uL (ref 850–3900)
Absolute Monocytes: 697 {cells}/uL (ref 200–950)
Basophils Absolute: 42 {cells}/uL (ref 0–200)
Basophils Relative: 0.5 %
Eosinophils Absolute: 58 {cells}/uL (ref 15–500)
Eosinophils Relative: 0.7 %
HCT: 35.3 % (ref 35.0–45.0)
Hemoglobin: 11.6 g/dL — ABNORMAL LOW (ref 11.7–15.5)
MCH: 30.4 pg (ref 27.0–33.0)
MCHC: 32.9 g/dL (ref 32.0–36.0)
MCV: 92.7 fL (ref 80.0–100.0)
MPV: 10.9 fL (ref 7.5–12.5)
Monocytes Relative: 8.4 %
Neutro Abs: 5918 {cells}/uL (ref 1500–7800)
Neutrophils Relative %: 71.3 %
Platelets: 249 10*3/uL (ref 140–400)
RBC: 3.81 10*6/uL (ref 3.80–5.10)
RDW: 13.2 % (ref 11.0–15.0)
Total Lymphocyte: 19.1 %
WBC: 8.3 10*3/uL (ref 3.8–10.8)

## 2024-02-17 LAB — TSH: TSH: 0.84 m[IU]/L (ref 0.40–4.50)

## 2024-02-17 MED ORDER — NURTEC 75 MG PO TBDP
75.0000 mg | ORAL_TABLET | ORAL | 5 refills | Status: DC
  Filled 2024-02-17: qty 16, 32d supply, fill #0
  Filled 2024-03-13: qty 16, 32d supply, fill #1

## 2024-02-17 NOTE — Telephone Encounter (Signed)
 PA has been approved for Nurtec PA number is [phone] it is good for 1 year from Feb 16 2024 until Feb 15 2025. Mychart sent to pt to let them know,

## 2024-02-17 NOTE — Telephone Encounter (Signed)
 Insurance called clinical questions answered

## 2024-02-18 ENCOUNTER — Other Ambulatory Visit (HOSPITAL_BASED_OUTPATIENT_CLINIC_OR_DEPARTMENT_OTHER): Payer: Self-pay

## 2024-02-21 ENCOUNTER — Encounter: Payer: Self-pay | Admitting: Psychiatry

## 2024-02-22 ENCOUNTER — Other Ambulatory Visit: Payer: Self-pay | Admitting: Psychiatry

## 2024-02-22 ENCOUNTER — Telehealth: Payer: Self-pay

## 2024-02-22 ENCOUNTER — Other Ambulatory Visit (HOSPITAL_BASED_OUTPATIENT_CLINIC_OR_DEPARTMENT_OTHER): Payer: Self-pay

## 2024-02-22 ENCOUNTER — Other Ambulatory Visit: Payer: Self-pay | Admitting: Internal Medicine

## 2024-02-22 ENCOUNTER — Other Ambulatory Visit: Payer: Self-pay

## 2024-02-22 DIAGNOSIS — F061 Catatonic disorder due to known physiological condition: Secondary | ICD-10-CM

## 2024-02-22 DIAGNOSIS — F411 Generalized anxiety disorder: Secondary | ICD-10-CM

## 2024-02-22 MED ORDER — AMLODIPINE BESYLATE 10 MG PO TABS
10.0000 mg | ORAL_TABLET | Freq: Every day | ORAL | 1 refills | Status: DC
  Filled 2024-02-22: qty 90, 90d supply, fill #0
  Filled 2024-06-21: qty 90, 90d supply, fill #1

## 2024-02-22 MED ORDER — VORTIOXETINE HBR 20 MG PO TABS
20.0000 mg | ORAL_TABLET | Freq: Every day | ORAL | 0 refills | Status: DC
  Filled 2024-02-22 – 2024-03-22 (×2): qty 90, 90d supply, fill #0

## 2024-02-22 MED ORDER — CLONAZEPAM 0.5 MG PO TABS
0.5000 mg | ORAL_TABLET | Freq: Two times a day (BID) | ORAL | 0 refills | Status: DC
  Filled 2024-02-22: qty 180, 90d supply, fill #0

## 2024-02-22 NOTE — Telephone Encounter (Signed)
 MyChart message:   CBC, CMP and TSH results available thru MyChart from 02/16/24- Dr Liane Redman. Elevated BUN/Cr is probably due to daily need for OTC Tylenol  or Naproxen for HA over the last 3 months.  Nurtec QOD for HA per Dr. Ty Gales started on 02/18/24  Latuda  40mg  started 02/18/24, tolerating well so far  Next ECT schedule for 02/29/24- 3 week intervals        Affect, activity  and level of engagement remains the same at this time.  ROV with [name] 03/09/24  Advise of any changes if needed.  Thank you-  [name] RN

## 2024-02-23 ENCOUNTER — Other Ambulatory Visit (HOSPITAL_BASED_OUTPATIENT_CLINIC_OR_DEPARTMENT_OTHER): Payer: Self-pay

## 2024-02-23 ENCOUNTER — Other Ambulatory Visit: Payer: Self-pay

## 2024-02-24 NOTE — Telephone Encounter (Signed)
Sent via [place] as requested.

## 2024-02-24 NOTE — Telephone Encounter (Signed)
 Send this message back via My chart: Labs and everything look good.  No changes. Nori Beat, MD, DFAPA

## 2024-02-27 NOTE — Patient Instructions (Addendum)
 It was a pleasure to see you today. Labs reviewed and are stable. Continue follow up with Psychiatry. Follow up in December.

## 2024-02-29 DIAGNOSIS — N189 Chronic kidney disease, unspecified: Secondary | ICD-10-CM | POA: Diagnosis not present

## 2024-02-29 DIAGNOSIS — J449 Chronic obstructive pulmonary disease, unspecified: Secondary | ICD-10-CM | POA: Diagnosis not present

## 2024-02-29 DIAGNOSIS — F3113 Bipolar disorder, current episode manic without psychotic features, severe: Secondary | ICD-10-CM | POA: Diagnosis not present

## 2024-02-29 DIAGNOSIS — G4730 Sleep apnea, unspecified: Secondary | ICD-10-CM | POA: Diagnosis not present

## 2024-02-29 DIAGNOSIS — I5032 Chronic diastolic (congestive) heart failure: Secondary | ICD-10-CM | POA: Diagnosis not present

## 2024-02-29 DIAGNOSIS — F419 Anxiety disorder, unspecified: Secondary | ICD-10-CM | POA: Diagnosis not present

## 2024-02-29 DIAGNOSIS — E039 Hypothyroidism, unspecified: Secondary | ICD-10-CM | POA: Diagnosis not present

## 2024-02-29 DIAGNOSIS — K219 Gastro-esophageal reflux disease without esophagitis: Secondary | ICD-10-CM | POA: Diagnosis not present

## 2024-02-29 DIAGNOSIS — I2510 Atherosclerotic heart disease of native coronary artery without angina pectoris: Secondary | ICD-10-CM | POA: Diagnosis not present

## 2024-03-02 ENCOUNTER — Telehealth: Payer: Self-pay

## 2024-03-02 DIAGNOSIS — T17908A Unspecified foreign body in respiratory tract, part unspecified causing other injury, initial encounter: Secondary | ICD-10-CM

## 2024-03-02 NOTE — Telephone Encounter (Signed)
 Copied from CRM [id number]. Topic: Clinical - Request for Lab/Test Order >> Mar 02, 2024 10:26 AM Hilton Lucky wrote: Reason for CRM: States has been more than a year and CT has not been done yet, therefore they would like a new order to be placed so they can get that scheduled and completed.   Please advise Dr. Waylan Haggard.

## 2024-03-08 ENCOUNTER — Other Ambulatory Visit (HOSPITAL_BASED_OUTPATIENT_CLINIC_OR_DEPARTMENT_OTHER): Payer: Self-pay

## 2024-03-08 MED ORDER — CLONIDINE HCL 0.1 MG PO TABS
0.1000 mg | ORAL_TABLET | ORAL | 2 refills | Status: AC | PRN
  Filled 2024-03-08: qty 20, 20d supply, fill #0

## 2024-03-09 ENCOUNTER — Ambulatory Visit: Admitting: Psychiatry

## 2024-03-12 ENCOUNTER — Other Ambulatory Visit (HOSPITAL_BASED_OUTPATIENT_CLINIC_OR_DEPARTMENT_OTHER): Payer: Self-pay

## 2024-03-12 ENCOUNTER — Other Ambulatory Visit: Payer: Self-pay | Admitting: Psychiatry

## 2024-03-12 MED ORDER — LURASIDONE HCL 40 MG PO TABS
40.0000 mg | ORAL_TABLET | Freq: Every day | ORAL | 2 refills | Status: DC
  Filled 2024-03-12 – 2024-03-13 (×2): qty 30, 30d supply, fill #0

## 2024-03-13 ENCOUNTER — Other Ambulatory Visit (HOSPITAL_BASED_OUTPATIENT_CLINIC_OR_DEPARTMENT_OTHER): Payer: Self-pay

## 2024-03-14 ENCOUNTER — Other Ambulatory Visit (HOSPITAL_BASED_OUTPATIENT_CLINIC_OR_DEPARTMENT_OTHER)

## 2024-03-14 ENCOUNTER — Ambulatory Visit: Admitting: Pulmonary Disease

## 2024-03-14 DIAGNOSIS — K219 Gastro-esophageal reflux disease without esophagitis: Secondary | ICD-10-CM | POA: Diagnosis not present

## 2024-03-14 DIAGNOSIS — E039 Hypothyroidism, unspecified: Secondary | ICD-10-CM | POA: Diagnosis not present

## 2024-03-14 DIAGNOSIS — F419 Anxiety disorder, unspecified: Secondary | ICD-10-CM | POA: Diagnosis not present

## 2024-03-14 DIAGNOSIS — I2510 Atherosclerotic heart disease of native coronary artery without angina pectoris: Secondary | ICD-10-CM | POA: Diagnosis not present

## 2024-03-14 DIAGNOSIS — N189 Chronic kidney disease, unspecified: Secondary | ICD-10-CM | POA: Diagnosis not present

## 2024-03-14 DIAGNOSIS — I130 Hypertensive heart and chronic kidney disease with heart failure and stage 1 through stage 4 chronic kidney disease, or unspecified chronic kidney disease: Secondary | ICD-10-CM | POA: Diagnosis not present

## 2024-03-14 DIAGNOSIS — I5032 Chronic diastolic (congestive) heart failure: Secondary | ICD-10-CM | POA: Diagnosis not present

## 2024-03-14 DIAGNOSIS — J449 Chronic obstructive pulmonary disease, unspecified: Secondary | ICD-10-CM | POA: Diagnosis not present

## 2024-03-14 DIAGNOSIS — G4730 Sleep apnea, unspecified: Secondary | ICD-10-CM | POA: Diagnosis not present

## 2024-03-14 DIAGNOSIS — I495 Sick sinus syndrome: Secondary | ICD-10-CM | POA: Diagnosis not present

## 2024-03-14 DIAGNOSIS — Z950 Presence of cardiac pacemaker: Secondary | ICD-10-CM | POA: Diagnosis not present

## 2024-03-14 DIAGNOSIS — F3113 Bipolar disorder, current episode manic without psychotic features, severe: Secondary | ICD-10-CM | POA: Diagnosis not present

## 2024-03-14 DIAGNOSIS — Z79899 Other long term (current) drug therapy: Secondary | ICD-10-CM | POA: Diagnosis not present

## 2024-03-16 ENCOUNTER — Encounter: Payer: Self-pay | Admitting: Psychiatry

## 2024-03-16 ENCOUNTER — Ambulatory Visit: Admitting: Psychiatry

## 2024-03-16 DIAGNOSIS — G250 Essential tremor: Secondary | ICD-10-CM

## 2024-03-16 DIAGNOSIS — G4733 Obstructive sleep apnea (adult) (pediatric): Secondary | ICD-10-CM

## 2024-03-16 DIAGNOSIS — F061 Catatonic disorder due to known physiological condition: Secondary | ICD-10-CM | POA: Diagnosis not present

## 2024-03-16 DIAGNOSIS — F411 Generalized anxiety disorder: Secondary | ICD-10-CM

## 2024-03-16 DIAGNOSIS — F5105 Insomnia due to other mental disorder: Secondary | ICD-10-CM

## 2024-03-16 DIAGNOSIS — F314 Bipolar disorder, current episode depressed, severe, without psychotic features: Secondary | ICD-10-CM

## 2024-03-16 DIAGNOSIS — R413 Other amnesia: Secondary | ICD-10-CM | POA: Diagnosis not present

## 2024-03-16 MED ORDER — MODAFINIL 100 MG PO TABS
ORAL_TABLET | ORAL | 0 refills | Status: DC
  Filled 2024-03-16: qty 30, 33d supply, fill #0

## 2024-03-16 MED ORDER — LURASIDONE HCL 60 MG PO TABS
60.0000 mg | ORAL_TABLET | Freq: Every day | ORAL | 1 refills | Status: DC
  Filled 2024-03-16: qty 30, 30d supply, fill #0
  Filled 2024-04-16: qty 30, 30d supply, fill #1

## 2024-03-16 NOTE — Patient Instructions (Addendum)
 Increase Latuda  back to 60 mg daily.  Trial lorazepam  0.5-1 mg for catatonia.  Let me know if it helps.  Once those are done, if not better, add 1/2 modafinil each morning for 1 week and if no benefit not side effects, increase to 1 each morning.

## 2024-03-16 NOTE — Progress Notes (Signed)
 Meghan Oliver [id number] 01-16-1941 83 y.o.  Subjective:   Patient ID:  Meghan Oliver is a 83 y.o. (DOB 30-Jan-1941) female. Patient was last seen August 21, 2018 Chief Complaint:  Chief Complaint  Patient presents with   Follow-up   Depression   Anxiety   Headache   Sleeping Problem   Memory Loss   Fatigue     Meghan Oliver presents to the office today for follow-up of Severe TR bipolar depression with psychotic features including catatonia.  11/30/2018 was the last visit and the following was noted: Had ECT yesterday at [facility].  Last was Jan 8 and was doing well then.  Next scheduled April 15.  Had a lithium  level from there which is pending. Pretty good until the last 10 days to 2 weeks with less energy and motivation.  Bothered by old sick dog.  Worried about how that will affect her.  Has slept with the dog.  [place]ed [place].  Going to gym and playing [place].  Took the class and didn't feel she caught on quickly. Wonders if it is bc of the ECT memory effects.  Bridge is harder and not playing.  Planning to return to [facility] mid April.   spent the winter in [place]  per usual..  No significant depression since here. ECT frequency about 6 weeks.  Gets very anxious the day of the ECT.  ECT consistently for a year.  Best in mood in 7-8 years.  Also the pacemaker helped.  Friends notice the benefit.  Plan:  Option increase in the nortriptyline  if needed.  Considered this.  They agree and would like to increase nortriptyline  to 75 to try to reduce tendency to depression before the ECT.   03/19/19  Addendum: Here the contents from an email from the nurse for Meghan Oliver  Hello [name],   I hope you and your family are healthy and well. I took Meghan Oliver to [facility] for ECT and here are labs from 01/26/19. I am going to also forward them to [facility] for follow up with her LFT's. They are still higher than I would like.  Her lithium  level was 0.51 on a daily dose of 300mg  qohs x 4 nights-  alternating with 450mg  qohs x 3 nights. She is not demonstrating any signs of elevated levels, minimal hand tremors, less anxiety and restlessness since her ECT treatment.  Her Nortryptyline level was 140 on 75mg  qhs.  I would like to continue her current dosages of both the above meds and recheck her lithium  level at her next scheduled ECT of 03/23/19.  Please let me know of any changes you would like to make. Take care, [name] RN Lithium  level was 0.4 on the 450mg  alt with 300 mg QOD. No changes were made in meds.  11/15/2019 phone call: Telephone call from patient's husband Dr. Annabelle Oliver on 11/14/2019  Patient is scheduled for ECT soon for maintenance ECT.  He thinks it is been 2-1/2 months since the last ECT but he is going to check the date for sure.  It is still be done being done at [facility].  He is wondering about skipping this treatment because the patient has continued to be free of depression and seems cognitively clearer as these ECT treatments have been spread out further from each other.  Patient remains on medications as prescribed.  If it is truly been over 2 months since the last ECT then it is reasonable to consider discontinuing ECT.  It is unlikely that ECT with the frequency of greater than 2 months is significantly helpful at preventing her relapse.  If however the ECT frequency is less than every 2 months it could still be helping to prevent recurrence.  He indicated he would consider this information and discuss it with her ECT team and make a decision.  They are heading to [place]  in the next few weeks.  Needs to schedule an appointment with me for follow-up because it is been many months.  He agrees.   02/19/2020 appt, the following noted: Moving to [facility] in a few months.  Ready to downsize.   Stopped ECT as discussed and has not been more deprressed.   Walks dog 4 times daily.   Lithium  0.7 on  01/30/20 on lithium  300 mg plus 150 mg on M, W, F Nortriptyline  70 on 75  mg HS. No SE except tremor.  Balance is not great.  Very happy.   No depression since here.  No mood swings.  Sleep and appetite is OK.  No med changes:  04/03/2020 TC with the following noted: Patient's husband called stating that her tremor was a little worse and she was having some stutter. No evidence of stroke otherwise. First thing would be to check serum lithium  level and BMP.  Order written.  Have asked her husband to let us  know about the lab to which it should be sent.  04/22/20 TC witht he following noted: Lithium  level is not dangerously high but it is has crept up to 1.0 which is higher than desired for her.  Our goal for her is 0.5-0.7.  At the current level it is likely causing side effects so we should reduce the dosage from lithium  300 mg plus 150 mg on M, W, F  TO 300 mg daily.  Meaning drop off the 150 mg capsule.  It will take about 2 weeks for the level to gradually come down to the desired level.  05/08/2020 appt with the following noted: Seen with H and [name] nurse. Moving to [facility] and feels OK about it.  I think I'll be fine.   Pretty good with balance.  Doing yoga and it helps. [name] has cancer. Old dog 11 & 1/2 yo still living. [name]'s concerns when went to [place] for D's BD she had difficulty time with tremor lethargy, more confusion.  Better when go back home. [name] notes using more lorazepam . At times gait is unsteady. Not significantly depressed.  Can be anxious at times.  Eating okay.  Sleeping okay.  Is easily confused under stress. Plan: reduce lithium  to 300 mg nightly.  07/14/20 appt with the following noted: Seen with her husband as well as her family nurse. Feeling fine and pleased with that.   Some anxiety over the move to smaller place. [name]'s twin also having heart problems Gene. [name]'s kids came and visited.    Plan: Continue nortriptyline  75 mg, paroxetine  20 mg, lithium  300 mg, Namenda  10 mg twice daily, lorazepam  0.5 mg 1/2-1 3 times daily as  needed, mirtazapine  7.5 mg nightly  02/27/2021 appointment with the following noted:  Seen with H and nurse [name] Received email from nurse [name] on 02/24/2021 indicated that he had moved into [facility] independent living in May.  Zabrina has gradually become more depressed and more anxious using lorazepam  as prescribed 3 times daily.  More memory issues.  She has remained engaged and initiating appropriate conversation.  She is very worried about her  68 year old dog who is in poor health and fears she will become more depressed when the dog dies.  She and her husband do not wish to prefer to pursue further ECT unless absolutely necessary. Likes [facility] so far.   No depression by her report.  Sold big house in Feb.  It worked out well.   Worries about her dog 29 yo will die soon. H agrees she's done well with depression.   Enjoys things and has interests. Plan: Continue Lithium  300 mg daily  Continue nortriptyline  75 mg daily Continue paroxetine  20 mg daily Continue lorazepam  0.5 mg 3 times daily for both anxiety and catatonia Continue vitamin B6 and primidone  for tremor Cerefolin NAC 2 daily.  05/13/2021 appointment with the following noted: Happy with [facility].  H and she agree is doing well with depression.Waynard Hailstone socialization. She complains of memory.   Family visited and getting along with Gregary Lean better.  H says Gregary Lean is acting better.  Goes to dinner and activities together now.  Better relationship with Gene.. Tremor has been OK.   Ativan  usually 0.25 mg TID and tolerated. Anxiety managed with this generally. Molly dog health more stable. To [place] before T'giving until April.   Plan: Continue Lithium  300 mg daily  Continue nortriptyline  75 mg daily Continue paroxetine  20 mg daily Continue lorazepam  0.5 mg 3 times daily for both anxiety and catatonia Continue vitamin B6 and primidone  for tremor   08/14/2021 appointment with the following noted: Seen with her husband [name] and nurse  [name]. Everyone reports that her mood has been stable.  Her anxiety is manageable with as needed lorazepam .  She is still happy with the transition to [facility].  They are preparing to go to [place]  for the winter per usual but may sell their house father there. Tolerating meds without any unusual side effects. Is dealing with grief over the loss of her dog Molly.  03/10/2022 appt noted:  seen with H and nurse Miki Alert [facility] house. [name] not good phsyically hard to play golf.  Been really hard. He'll be 85 in July.  Some stress scheduling with D's.   60 th wedding necessary this month.   Mood up and down.  Sister in law across the street can be difficult and demeaning.  She feels unsettled.  Some worry. Some back pain limiting activity.   Sleep is ok except with pain awakening.   No SE, except tremor some worse about 3-5 and helped by lorazepam  0.25 mg then No greater than 1 mg lorazepam  daily Patient denies any recent difficulty with anxiety except as noted.  Patient denies difficulty with sleep initiation or maintenance. Denies appetite disturbance.    Patient has some difficulty with concentration.  Patient denies any suicidal ideation.  Usually takes Ativan  at night and sleeps well 8 hours.   Plan no med changes  10/23/213 appt noted: Still doing well. Pneumonia and hosp for 6 days 3 weeks ago.  Viral.  Improving at this time. Increased tremor gradually in 3-4 mos helped by lorazepam  avg 1.00-1.25 mg daily. No change in primidone . Total R hip August 2023 and done well.  Finishing home PT.   Appetite poor for 7-8 mos.  2 protein drinks daily but may not eat more than 1 good meal a day. [facility]. Substantial memory problems in hospital and in unusual enviornments. Nurse notices decline.  B with LBD passed in August. No pain meds or muscle relaxants beyond pain. No depression.  H note sometimes she  has less motivation than others. [name] notes some normal waxing and waning of mood. Good  social interaction.   Not spending winter in [facility] this year.  D still lives there. Plan: Increase mirtazapine  15 mg HS for appetite.  Also used for sleep and depression.  09/23/22 appt noted:  with nurse and [name] [name] feeling bad with more pain and fatigue.  [name] has CHF.   Made Meghan Oliver sad.  Lost a cgood friends.  Rough time.  Mostly worry about [name].  She feels afraid to be alone if something happens.   Not sig depressed.    Just worry . H can't be as active.  Taking a shower is hard.   She's back exercising again.   Went to [employer] for Thanksgiving in [facility] and plan to go back for Christmas. It's hard to travel. Told nurse she was depressed. Appetite is better and once asleep is OK. Using more lorazepam  for anxiety 3-4 daily. Plan: Continue Lithium  300 mg daily  Continue nortriptyline  75 mg daily Continue paroxetine  20 mg daily Continue lorazepam  0.5 mg 3 times daily for both anxiety and catatonia Continue vitamin B6 and primidone  for tremor Increased and it helped mirtazapine  15 mg HS for appetite.  Also used for sleep and depression.  10/19/22  RTC  [name] reports not doing well.  Went to [facility].  Recent pneumonia.  They are thinking this is aspiration pneurmonia. Lethargic and anxious with tremor. Ruminating.  Hot to cold .  Low tolerance.  Going downhill quickly.  She's verbalizing depression and desire to isolate.  Won't go out.  Started within the last 2 weeks. Usually doesn't complain when she's sick.   [name]'s biggest concern is that Meghan Oliver is having trouble handling this bc she's up at night rambling and not really safe to be up at night.    Will see her 10/22/22 Get lithium  and nortriptyline  levels.   Nori Beat, MD, [facility]     10/22/22 emergency appt with nurse [name] and H [name]: Pneumonia Oct and then again 2nd week January.  It appears to have largely resoved.  It's really bad.  Nausea. Anxious bc [name] ill.  Doesn't want him to leave.  [name] sees anxiety triggering anxiety with little to no  appetite.  Hypersensitive to stimuli like light and noises.  Doesn't want TV on.  [name] says totally reversed in last 6 weeks.    [name] reports she's overemotional.  Friends died. Losing wt 91.4# today. More trouble with sleep. Lorazepam  increased to 2 mg daily.  Not sleepy with it.  Doesn't seem to be helpful. Last week neglect hygiene but better this week a little. [name] says STM is poor at times and at times disoriented if in [place].  Will walk in bathroom and wonder where sh is. Barium swallow pending for evaluation of refulex.    10/26/22 RTC:  RTC   No improvement since Friday.  Gradually less engaged.  Can be overstimulated with visitors.   Disc nortriptyline  level 115 is a little higher than it was and higher levels can cause SE in elderly.   Reduce nortriptyline  to 50 mg HS. Taking lorazepam    Consider low dose of Vraylar or pramipexole.  Or Spravato .    Nori Beat, MD, [facility]     11/15/22 appt noted: Dialogue with Dr. Mitzie Anda cardiologist agreed pt should be stable enough from CV perspective to receive Spravato  which could elevate BP.  If that occurs use prn metoprolol  25 mg. Disc in detail with Dr.  Heiden and he agrees Spravato  is appropriate though not FDA approved for bipolar depression.  Disc 2 metanalysis in support of this and this alernative is safer and likely better tolerated than the option of ECT and faster than options of trials of Trintellix , Auvelity, Latuda  etc. Tolerating meds.  She is depressed and very anxious and intermittently confused.  Taking clothes on and off bc rapid changes in comfort.  Worried over [employer].  Sleep disrupted appetite some better with mirtazapine . Received Spravato  56 mg first time today and was dissociated.  Resolved over 2 hours.  No HA, NV.  Was distressed moderately and very ambivalent about Tx.  H and nurse supportive of tx plan and for the pt.    11/24/22 appt noted: Current meds: nortriptyline  50 mg HS, paroxetine  20 mg daily, lithium  300  mg nightly, mirtazapine  30 mg nightly, memantine  10 mg twice daily.  Lorazepam  0.5 mg every 4 to 6 hours as needed catatonia or severe anxiety. Received first dose of Spravato  84 mg today.  She was more noticeably dissociated than with the 56 mg dose.  It did resolve over the 2-hour course of observation although she remained somewhat unsteady and needed to leave via wheelchair.  She did not have headache nausea or vomiting.  She remains ambivalent about the treatment. Husband notes she has continued to be anxious and depressed and was extremely anxious yesterday and somewhat confused.  At times would say things that did not make sense.  He remained supportive of the treatment while she remains ambivalent about the treatment.  Her appetite is good and she is eating well.  Sleep is variable.  H reported at times will be almost normal after receiving an extra dose of lorazepam  0.5 mg prn but this only lasts an hour or so. Plan: Continue Lithium  300 mg daily  Continue nortriptyline  50 mg daily Continue paroxetine  20 mg daily Continue vitamin B6 and primidone  for tremor Imirtazapine 30 mg HS for appetite,  used for sleep and depression.  Appetite better Switch lorazepam  to Loreev ER 2 mg AM and use lorazepam  0.5 mg prn.  For catatonia in hopes of more consistent relief.  Decision based on observation of periods of almost normalcy after some BZ doses.  We discussed potential switch of paroxetine  and nortriptyline  to Auvelity or Trintellix  or possibly Latuda .  However these switches will be difficult and it is hoped that the Spravato  may provide enough improvement to allow a switch in medicine more tolerable.  11/26/22 appt noted: Current meds: nortriptyline  50 mg HS, paroxetine  20 mg daily, lithium  300 mg nightly, mirtazapine  30 mg nightly, memantine  10 mg twice daily.  Lorazepam  0.5 mg every 4 to 6 hours as needed catatonia or severe anxiety.  Started Loreev 2 mg daily over weekend Received Spravato  84 mg  today.   Spravato  with expected dissociation.  She didn't experience relief from negative emotion.  Dissociation resolved over observation period.  No N, V, HA.   Still high anxiety and dread of most everything.  Depressed with negative thought predominates.  H notes periods of confusion and then maybe an hour of clarity sometimes after the dosing of lorazepam .  Then back to severe sx.  Eating is fair. No SI.  Fearful generally.  Needs help with ADLs.  Cannot take meds on her own. Tolerating meds without SE No noticeable effect from Loreev in terms of benefit or SE  12/01/22 appt noted: Received a phone call from nurse [name] this morning to call  as soon as possible this patient very agitated and confused.  Thrashing around in bed.  At times talking about dying out of the fear that she was going to die.  Highly anxious.  Not very cooperative with drinking water  or taking medicines this morning. Discussed the situation with her husband as well and we have to options: 1's if we can get her to the office to receive Spravato  is scheduled today that may provide some immediate relief and forestall and may be prevent hospitalization.  The second option is to proceed directly to hospitalization.  If she is unable to take nutrition and hydration hospitalization will be necessary. Discussed that appeared to be catatonic hyperactivity and agitation which has not responded to the extended release lorazepam  2 mg a day.  Therefore she was given 1 mg of Ativan  this morning and did calm down enough to make it to the Spravato  administration this afternoon.  She was able to cooperate with Spravato  administration and tolerated it well with typical levels of dissociation which gradually resolved over the 2 hours.  She was able to eat and drink fluids prior to coming to the appointment. Current meds: nortriptyline  50 mg HS, paroxetine  20 mg daily, lithium  300 mg nightly,  memantine  10 mg twice daily.  Lorazepam  0.5 mg every 4 to 6  hours as needed catatonia or severe anxiety.  Started Loreev 2 mg daily over weekend olanazpine 2.5 mg HS Received Spravato  84 mg today.   Husband notes that after Spravato  administration on Monday she was much improved for a period of several hours and was able to have conversations with her family members and make decisions about distributing some of her jewelry and other family decisions.  However over Monday evening into Tuesday she became more agitated and confused and anxious again.  There have been no obvious effects of taking the olanzapine  but it does not appear that long-acting lorazepam  2 mg as provided any significant relief. Plan: Continue Lithium  300 mg daily  Continue nortriptyline  50 mg daily Continue paroxetine  20 mg daily Continue vitamin B6 and primidone  for tremor DC mirtazapine  For catatonia: lorazepam0.5 mg q 4 hour mg daily as tolerated for catatonia with dose spread through the day..  Nurse manages. Restart Loreeve 2 mg daily and use prn lorazepam  for catatonia Increase olanzapine  5 mg HS for TRD, anxiety, catatonia, delusions of persecution  12/06/22 appt noted; Psych meds: as above except increase Loreev to 2 mg BID for severe catatonia. nortriptyline  50 mg HS, paroxetine   20 mg daily, lithium  300 mg nightly,  memantine  10 mg twice daily.  Lorazepam  0.5 mg every 4 to 6 hours as needed catatonia or severe anxiety.  Started Loreev 2 mg increased BID for severe catatonia,  increased olanazpine 5 mg HS for 3 days. Received 5th dose of Spravato  84 mg today.    She was able to cooperate with Spravato  administration and tolerated it well with typical levels of dissociation which gradually resolved over the 2 hours.  She was able to eat and drink fluids prior to coming to the appointment.   She still feels confused and depressed and anxious. Per family had periods over the weekend where she was confused thinking her H was dead even though he was right in front of her.  Also thinking  she is going to die and not get better.  Is eating and drinking fluids well.  Has needed extra lorazepam  to sleep.  Limited periods of lucidity per H. Tolerating high dose  lorazepam  but limited benefit for catatonia. Plan: Continue Lithium  300 mg daily  reduce nortriptyline  25 mg daily in anticipation of change Reduce paroxetine  10 mg daily in anticipation of change Continue vitamin B6 and primidone  for tremor For catatonia: lorazepam0.5 mg q 4 hour mg daily as tolerated for catatonia with dose spread through the day..  Nurse manages. DT severity of catatonic hyperactivy Loreeve 2 mg BID and use prn lorazepam  for catatonia Continue olanzapine  5 mg HS for TRD, anxiety, catatonia, delusions of persecution  12/08/22 appt noted: Psych meds:  Loreev to 2 mg BID for severe catatonia. nortriptyline  25 mg HS, paroxetine  10 mg daily, lithium  300 mg nightly,  memantine  10 mg twice daily.  Lorazepam  0.5 mg every 4 to 6 hours as needed catatonia or severe anxiety.  Started Loreev 2 mg increased BID for severe catatonia,  increased olanazpine 5 mg HS for 3 days. Received 6th dose of Spravato  84 mg today.    She was able to cooperate with Spravato  administration and tolerated it well with typical levels of dissociation which gradually resolved over the 2 hours.  She was able to eat and drink fluids prior to coming to the appointment.   She still feels confused and depressed and anxious. Hopeless.  Thoughts she is going to die. Per nurse and H no sig improvement noted with spravato .   No current alcohol problems  01/14/2023 appointment on an urgent basis: Hospitalized due to psychiatry at [facility] from 12/10/2022 until 01/03/2023.  Hospitalized for bipolar disorder severe depression with catatonia.  Received ECT and Latuda  60 mg daily was started.  Stopped paroxetine , lithium , nortriptyline , and mirtazapine .  Lorazepam  was switched to clonazepam . Current psych meds clonazepam  0.5 mg twice daily, Latuda  60 mg  daily.  Memantine  10 mg twice daily, weekly ECT rec Wt 101# and better appetite.  Sleep not great but ok.  Will lie awake some.  H says it's pretty good. Alert in the day.  No SE with meds.  Tremor better.   Lorazepam  only on ECT days. Plan:  For catatonia: Continue clonazepam  0.5 mg twice daily also for anxiety Continue Latuda  60 mg daily Continue lorazepam  0.5 mg as needed which is currently being used rarely. Recommend continue ECT  weekly as [facility] recommends  02/22/23    Murvin Arthurs nurse called at 11am.  Shaquavia is doing better, receiving ECT twice this week. Not having breakthrough anxiety now and [name] wants to decrease her morning dose of Klonopin  to help decrease her sedation. She wants to give her 1/2 of 0.5mg  Klonopin  in the am and just give her regular dose the other two times a day. Is this okay.        Electronically signed by Gregg Leap L at 02/22/2023 11:21 AM  03/11/23 appt:  Current psych med:  clonazepam  0.25 mg AM and 0.5 mg HS on 5/21, Latuda  60,  Worry anxiety.  Not as happy as those around here. Reduced ECT to weekly does not maintain the benefit.  But they didn't want to continue it.   [name] has  talked with ECT doc about holding off on ECT and missed dose but she's gotten more depressed.   Did twice weekly for awhle and it was beneficial.  Sched for Tuesday Plan: Start Trintellix  off label for dep.  Trying to find solution that would allow stopping ECT Recommend continue ECT  but increase to twice weekly as [facility] recommends  04/15/23 appt noted:  with nurse and H Reduced  clonazepam  to 0.25 mg AM and 0.5 mg HS, Latuda  60, incr Trintellix  10 ECT once or twice weekly.  Took 13 day holiday with ECT and progressively downhill.  Worse if goes more than a week without ECT.   Some interest but not much initiative.  Read paper some.  Can initiate conversation with nurse with some recent details.  Still other memory problems frustrating to everyone.   Some memory px  even before ECT but worse. Sleep good.  Feels good today and appetite ok.   D been at the house lately.  D from [place] No excessive sleep.   She naps some easily in the day.  No sig changes with less clonazepam .  Minimal effect from counseling DT memory. No new concerns.   No SE.    05/20/23 appt noted: Tolerated increase in Trintellix  to 15 mg daily.  Reduced clonazepam  0.25 mg AM and 0.375 mg HS. No prn lorazepam .  No dep verbalized since here.  Trouble with sleep.  Intial insomnia.  No caffeine .  Some napping.  Not drowsy now.  Will lay on couch after breakfast.   Not anxious about anything.  Sleep trouble in the last few days. Last ECT a little over 2 weeks to wait another week if possible.  H thinks she is more awake with less ECT.  Will spontaneously seek out paper and fix several. Where do we go from here?   Per H .  Wants her to be able to stop ECT. Nurse notes better exec function.  Nurse has seen some lack of motivation and little spontaneous speech.  Won't engage in conversation much per family.   Still some rumination.   She has no other complaints other than some physical concerns.  Plan: For catatonia: clonazepam  to 0.25 mg AM and 0.375 mg HS for 2 weeks then 0.25 mg BID for cog reasons and not currently catatonic Continue Latuda  60 mg daily Continue lorazepam  0.5 mg as needed which is currently being used rarely. Incr Trintellix  to 20 mg daily off label for bipolar dep.  Trying to find solution that would allow stopping ECT Recommend continue ECT  but increase to twice weekly as [facility] recommends but considering reduction to every 3 weeks is reasonable.  She is not dep now.  06/24/23 appt noted: urgent appt with H and nurse. Tolerated increase in Trintellix  to 20 mg daily.  Reduced clonazepam  0.25 mg AM and 0.375 mg HS. No prn lorazepam , mirtazapine  7.5 mg HS, Latuda  60.  Memantine  10 BID Last eCT Monday every 3-3&1/2 weeks.   Mood and anxiety dipped with time away from ECT.    ECT still helpful.  Away from ECT harder to read and watch TV.   [facility] wants 2 week interval. Minimal initiation of conversation with others.   Not sure what to say in conversation.   General malaise over the last 10 days. Appetite is fair.  D Mariah Shines came last weekend and was helpful.  Not dep today but was before ECT this week.  Meds have not prevented recurrence. H asks about restarting lithium  which helped in the past.  07/25/23 appt : seen with nurse Last ECT 07/19/23.   Lately weekly bc sx got worse.  Nurse has seen improvement.  [facility] saying it is helpful. Added Concerta  18 mg AM.  No SE.  Nurse noticed a little more attempt to conc but not a lot of benefit.   No prn lorazepam  needed.   H says she's sleepy a  lot of the time.  Nurse suspects clonazepam  as the source of sleepiness. No benefit with more clonazepam . Sleep helped by mirtazapine  7.5 mg HS. No SE with Trintellix  20 mg  Apppetite pretty normal.   3 days without primidone  but tremor noticeably worse. Planning to go to [facility] for T'giving week. H using cane and less mobile. Has assistance at home for parts of the day.  Uses Rolator at night when gets up to urinate.  08/26/23 appt noted:  with nurse meds No sig dep nor anxiety.  Sleep good.  Untreated OSA per family.   Per nurse more effort with things with increase concerta  27 AM.  No SE.   Weekly ECT ongoing.  Will be 11 days . Going to [facility] for a week for T'giving.  Looking forward to seeing g'kids.   No prn lorazepam  needed.   Has pacemaker for heart rate.   ECT clearly helping.   H agrees gradually improving.    Appetite ok.  Wt consistent 97-99#.   Neuro FU for eval cog 09/14/23.   Needs help with ADLs but less than before.   Plan: increase off label Concerta  DT no sig change and no SE at 27 mg so increase to 36 mg AM for TRD and cog problems  10/14/23 appt noted:  with nurse.   Psych meds: Clonazepam  0.25  nightly for catatonia, lorazepam  0.5 mg as needed he anxiety  or insomnia, Latuda  60 mg, memantine  10 mg twice daily, Concerta  36 mg every morning, primidone  100 every morning and 50 nightly for tremor.,  Trintellix  20 mg daily. No difference with increase Concerta .  No benefit.   Episode accidental taking clonazepam  2 mg HS and excessively drowsy for 3 hours.   Neuro reported potential SZ activity on EEG.  Plans 24 hour EEG to evaluate.  She & H had Covid and missed ECT for a month.  When missed ECT was worse with less engagement, lethargy, poor appetite and lack of initiation.  Better with resuming ECT.   Good evening last night and went out to dinner.   Cindy here from [place].  Last ECT 10/03/23 and then 10/11/23 and next will be 10/18/23.  Only weekly ECT until fully better then back to every other day. Now dep better again with ECT and less distressed.   Increase stress and anxiety with [name] Joe with lymphoma recurrence.   Appetite OK now.   Plan: DC Concerta  DT NR  01/02/24 appt noted:  no show for appt Per Dr. Ty Gales: 10/24/23 1 hour EEG: Her 1-hour EEG in 10/2023 showed occasional right temporal focal slowing and occasional right frontotemporal epileptiform discharges. She had a 24-hour ambulatory EEG in February which showed occasional bifrontal spikes, maximal over the right frontopolar region, independent sharp waves over the bilateral temporal regions with triphasic morphology. No electrographic seizures seen.  Jhonny Moss, MD    12/20/23 12:32 PM Note Spoke to patient's psychiatrist at [facility], Dr. Wenona Hamilton about her EEG. She has not had any clinical symptoms of seizure that can be determined. We discussed treating EEG versus treating patient, and agree that since she has not had any clinical symptoms, close clinical monitoring off seizure medication will be beneficial. He reviewed her prior visits and notes that ECT has been very helpful and lifesaving for Meghan Oliver, and that starting seizure medication would affect ECT treatment, but if there is no other  option, they can work around it. Agreed to monitor patient, we will look into starting a  different headache prophylactic medication that is not an anti-seizure medication.      02/10/24 appt noted: Med: Trintellix  20, Latuda  60 mg daily, mirtazapine  7.5 mg HS, clonazepam  0.25 mg BID, Memantine  10 BID, lorazepam  0.5 mg about once per week.   Worsening HA over last couple of mos.  Daily.   ECT every 3 weeks.  Overall better 2 weeks after ECT and then down more the next week.   No HA today and no anxiety.   Appetite good 96# stable.  Some walking daily.  No unusual fear.   No clear SE.   Out socially once or twice weekly.   H notes cognition will be clear cognitively and do well for a day or 2 and then 90% worse for no reason.   Plan: Reduce  Latuda  40 mg daily for mental and physical clarity  03/16/24 appt noted:  with H and nurse Med: Trintellix  20, Latuda  40 mg daily, mirtazapine  7.5 mg HS, clonazepam  0.25 mg BID, Memantine  10 BID, lorazepam  0.5 mg about once per week.   No improvement in energy with reduced Latuda .  No change in memory.  Still sig STM issues without clear correlation with ECT.  ECT q 3 weeks.  Then changed to 2 weeks bc worse. Some sx worse with less Latuda . Less motivation to exercise.  Will be content to just lay around 20 hours daily.   Fleeting moments of clarity and then it goes away.     ECT-MADRS    Flowsheet Row Clinical Support from 12/08/2022 in [facility]  MADRS Total Score 54   GAD-7    Flowsheet Row Office Visit from 02/16/2024 in [facility], [facility]  Total GAD-7 Score 16   Mini-Mental    Flowsheet Row Office Visit from 12/07/2023 in [facility]  Total Score (max 30 points ) 28   PHQ2-9    Flowsheet Row Office Visit from 02/16/2024 in [facility], MD Office Visit from 01/14/2023 in [facility] Judge, MD Office Visit from 10/11/2022 in [facility] Judge, MD Office Visit from 08/17/2022 in [facility] Judge,  MD Office Visit from 07/19/2022 in [facility] Judge, MD  PHQ-2 Total Score 6 4 6  0 0  PHQ-9 Total Score 21 14 -- -- --   Flowsheet Row ED to Hosp-Admission (Discharged) from 07/01/2022 in [facility] Admission (Discharged) from 05/18/2022 in [facility] Pre-Admission Testing 60 from 05/10/2022 in [facility]  C-SSRS RISK CATEGORY No Risk No Risk No Risk   Past Psychiatric Medication Trials:  Prozac  40 with lithium  and Zyprexa  2.5 to 5 mg,  nortriptyline , paroxetine ,  Effexor 225 Wellbutrin 300 mirtazapine ,  Trintellix  20 Mirtazapine  7.5 Others  Pramipexole 0.625 daily hallucinations  Rexulti EPS, Abilify 15, Vraylar 1.5 1 week,  Latuda  60 mg  Olanzapine  10 briefly Caplyta  briefly no response   Lamotrigine level 7.5  Namenda ,  lorazepam ,  Clonazaam 0.5 BID  no response Spravato  after 6 tx of 84 mg and 2 of 56 mg .  Hospitalized at [facility] on psychiatric unit from 12/10/2022 to 01/03/2023 and received ECT and started Latuda  60 mg daily.  Stopped paroxetine , nortriptyline , and lithium .  this list is not exhaustive  Review of Systems:  Review of Systems  Constitutional:  Positive for fatigue. Negative for appetite change.  Cardiovascular:  Negative for palpitations.  Gastrointestinal:  Negative for nausea.       Linzess  and Mozantic  managed   Musculoskeletal:  Positive for gait problem.  Neurological:  Positive for dizziness, weakness and headaches. Negative for tremors.       Some balance issues  Psychiatric/Behavioral:  Positive for confusion and decreased concentration. Negative for agitation, behavioral problems, dysphoric mood, hallucinations, self-injury, sleep disturbance and suicidal ideas. The patient is not nervous/anxious and is not hyperactive.     Medications: I have reviewed the patient's current medications.  Current Outpatient Medications  Medication Sig Dispense Refill    amLODipine  (NORVASC ) 10 MG tablet Take 1 tablet (10 mg total) by mouth daily. 90 tablet 1   aspirin  81 MG chewable tablet Chew 1 tablet (81 mg total) by mouth daily.     Calcium  Carbonate-Vitamin D  (CALCIUM  600/VITAMIN D  PO) Take 1 tablet by mouth 2 (two) times daily.     cetirizine (ZYRTEC) 10 MG tablet Take 10 mg by mouth as needed for allergies.     Cholecalciferol  (VITAMIN D3) 50 MCG (2000 UT) TABS Take 2,000 Units by mouth in the morning.     clonazePAM  (KLONOPIN ) 0.5 MG tablet Take 1 tablet (0.5 mg total) by mouth 2 (two) times daily. 180 tablet 0   cloNIDine  (CATAPRES ) 0.1 MG tablet Take 1 tablet (0.1 mg total) by mouth as needed prior to ECT treatment 20 tablet 2   dexlansoprazole  (DEXILANT ) 60 MG capsule Take 1 capsule (60 mg total) by mouth daily. 90 capsule 3   glycerin  adult 2 g suppository Use 1 suppository as needed if you have not had a bowel movement in  4 to 5 days 25 suppository 0   hydrochlorothiazide  (HYDRODIURIL ) 12.5 MG tablet Take 1 tablet (12.5 mg total) by mouth daily. 90 tablet 1   Krill Oil 500 MG CAPS Take 500 mg by mouth daily.     lactulose  (CHRONULAC ) 10 GM/15ML solution Take 30 mLs (20 g total) by mouth 3 (three) times daily between meals as needed for mild constipation. (Patient taking differently: Take 20 g by mouth daily as needed for mild constipation.) 473 mL 3   Lavender Oil 80 MG CAPS Take 160 mg by mouth at bedtime.     levothyroxine  (SYNTHROID ) 75 MCG tablet Take 1 tablet (75 mcg total) by mouth daily. 90 tablet 2   levothyroxine  (SYNTHROID ) 75 MCG tablet Take 1 tablet (75 mcg total) by mouth daily. 90 tablet 2   linaclotide  (LINZESS ) 72 MCG capsule Take 2 capsules (145 mcg total) by mouth daily before breakfast. 180 capsule 1   LORazepam  (ATIVAN ) 0.5 MG tablet TAKE 2 TABLETS (1 MG)  BY MOUTH EVERY NIGHT AT BEDTIME AND 1 TABLET EVERY 4 HOURS AS NEEDED FOR ANXIETY 270 tablet 0   losartan  (COZAAR ) 100 MG tablet Take 1 tablet (100 mg total) by mouth daily. 90  tablet 1   magic mouthwash w/lidocaine  SOLN Take 5 mLs by mouth 3 (three) times daily as needed for mouth pain. 120 mL 0   memantine  (NAMENDA ) 10 MG tablet Take 1 tablet (10 mg total) by mouth 2 (two) times daily. 180 tablet 1   metoprolol  tartrate (LOPRESSOR ) 25 MG tablet Take 1 tablet (25 mg total) by mouth as needed. 45 tablet 0   mirtazapine  (REMERON ) 7.5 MG tablet Take 1 tablet (7.5 mg total) by mouth at bedtime. 90 tablet 0   modafinil (PROVIGIL) 100 MG tablet 1/2 tablet each morning for 1 week then 1 each AM 30 tablet 0   Multiple Vitamins-Minerals (MULTIVITAMIN WITH MINERALS) tablet Take 1 tablet by mouth daily.  Multiple Vitamins-Minerals (PRESERVISION AREDS 2 PO) Take 1 capsule by mouth in the morning and at bedtime.     neomycin -polymyxin b-dexamethasone  (MAXITROL ) 3.5-10000-0.1 OINT Apply thin line (1 cm) of ointment to right eye twice daily for 1 week. (Patient taking differently: as needed.) 3.5 g 1   neomycin -polymyxin-dexameth (MAXITROL ) 0.1 % OINT apply a small amount into the affected eye twice a day for 1 week (Patient taking differently: as needed. apply a small amount into the affected eye twice a day for 1 week) 3.5 g 1   ofloxacin  (OCUFLOX ) 0.3 % ophthalmic solution Instill 1 drop into affected eye three times daily for 5 days. 5 mL 3   omeprazole -sodium bicarbonate  (ZEGERID ) 40-1100 MG capsule Take 1 capsule by mouth daily before breakfast. (Patient taking differently: Take 1 capsule by mouth as needed.) 90 capsule 3   ondansetron  (ZOFRAN -ODT) 4 MG disintegrating tablet Place 1 tablet every 6 hours by translingual route as needed. 20 tablet 0   polyethylene glycol (MIRALAX  / GLYCOLAX ) 17 g packet Take 17 g by mouth as needed for mild constipation.     primidone  (MYSOLINE ) 50 MG tablet Take 2 tablets (100 mg total) by mouth every morning AND 1 tablet (50 mg total) at bedtime. 270 tablet 0   Rimegepant Sulfate  (NURTEC) 75 MG TBDP Take 1 tablet (75 mg total) by mouth every  other day for headache prevention 16 tablet 5   sennosides-docusate sodium  (SENOKOT-S) 8.6-50 MG tablet Take 2 tablets by mouth as needed.     thiamine  (VITAMIN B-1) 100 MG tablet Take 1 tablet (100 mg total) by mouth daily. 100 tablet 3   valACYclovir  (VALTREX ) 1000 MG tablet Take 2 tablets (2,000 mg total) by mouth at onset, then take 2 tablets 12 hours later. (Patient taking differently: Take 2,000 mg by mouth as needed.) 30 tablet 1   vitamin B-12 (CYANOCOBALAMIN ) 1000 MCG tablet Take 1,000 mcg by mouth at bedtime.     vortioxetine  HBr (TRINTELLIX ) 20 MG TABS tablet Take 1 tablet (20 mg total) by mouth daily. 90 tablet 0   lurasidone  60 MG TABS Take 1 tablet (60 mg total) by mouth daily with breakfast. 30 tablet 1   No current facility-administered medications for this visit.    Medication Side Effects: Other: tremor stable.  No worse.  Allergies:  Allergies  Allergen Reactions   Propranolol Other (See Comments)    Low blood pressure. Bradycardia    Propranolol Hcl Other (See Comments)   Brexpiprazole Other (See Comments)    Aphasia and catatonia    Past Medical History:  Diagnosis Date   Anxiety    Arthritis    hips, spine (03/17/2018)   Bipolar II disorder (HCC)    CHF (congestive heart failure) (HCC)    Chronic bronchitis (HCC)    Chronic lower back pain    Chronic right hip pain    CKD (chronic kidney disease), stage II    Coronary artery disease    stent x1   Esophagitis, erosive    GAD (generalized anxiety disorder)    GERD (gastroesophageal reflux disease)    Headache    maybe monthly (03/17/2018))   Heart murmur, systolic    History of adenomatous polyp of colon    08-04-2016  tubular adenoma   History of blood transfusion 12/2017   related to vascular hematoma   History of electroconvulsive therapy    at [facility]--  started 04-15-2015 to 11-17-2016  total greater than 40 times   History of hiatal  hernia    Hyperlipidemia    Hypertension     Hypothyroidism    Internal carotid artery stenosis, bilateral    per last duplex 05-01-2014  bilateral ICA 40-59%   Major depression, chronic    ECT treatments extensive and multiple started 07/ 2016   Memory loss    both short and long-term; needs frequent reminders to follow instrucitons (05/16/2017)   Migraines    none in years (03/17/2018)   OSA (obstructive sleep apnea)    per study 06/ 2012 moderate OSA  ; refuses to wear masks (03/17/2018)   Osteoporosis    Pneumonia 07/29/2022   Poor historian    due to short term memory loss   Presence of permanent cardiac pacemaker 03/17/2018   Pulmonary nodule    monitored by pcp   S/P placement of cardiac pacemaker 03/17/18 [facility]  03/18/2018   Short-term memory loss    Sick sinus syndrome (HCC)     Family History  Problem Relation Age of Onset   Heart attack Father 42       deceased   Hypertension Father    Heart disease Father    Dementia Brother    Parkinson's disease Brother    Heart disease Brother    Breast cancer Paternal Aunt        Age 9's   Breast cancer Paternal Grandmother        Age unknown   Colon cancer Neg Hx     Social History   Socioeconomic History   Marital status: Married    Spouse name: Dr. Nelson Bandy   Number of children: 2   Years of education: 15   Highest education level: Not on file  Occupational History   Occupation: housewife    Employer: UNEMPLOYED  Tobacco Use   Smoking status: Former    Current packs/day: 0.00    Average packs/day: 2.0 packs/day for 15.0 years (30.0 ttl pk-yrs)    Types: Cigarettes    Start date: 01/13/1956    Quit date: 01/13/1971    Years since quitting: 53.2    Passive exposure: Never   Smokeless tobacco: Never  Vaping Use   Vaping status: Never Used  Substance and Sexual Activity   Alcohol use: Yes    Comment: occassional 1 x a week   Drug use: Never   Sexual activity: Not Currently    Comment: intercourse age 72, sexual partners less than 5  Other  Topics Concern   Not on file  Social History Narrative   Right handed   Drinks caffeine  no   Single home at [employer]   Social Drivers of Health   Financial Resource Strain: Low Risk  (12/12/2022)   Received from [facility]   Overall Financial Resource Strain (CARDIA)    Difficulty of Paying Living Expenses: Not hard at all  Food Insecurity: No Food Insecurity (12/12/2022)   Received from [facility]   Hunger Vital Sign    Within the past 12 months, you worried that your food would run out before you got the money to buy more.: Never true    Within the past 12 months, the food you bought just didn't last and you didn't have money to get more.: Never true  Transportation Needs: No Transportation Needs (12/12/2022)   [facility] from [facility] - Transportation    In the past 12 months, has lack of transportation kept you from medical appointments or from  getting medications?: No    Lack of Transportation (Non-Medical): No  Physical Activity: Not on file  Stress: Not on file  Social Connections: Not on file  Intimate Partner Violence: Not At Risk (07/06/2022)   Humiliation, Afraid, Rape, and Kick questionnaire    Fear of Current or Ex-Partner: No    Emotionally Abused: No    Physically Abused: No    Sexually Abused: No    Past Medical History, Surgical history, Social history, and Family history were reviewed and updated as appropriate.   Please see review of systems for further details on the patient's review from today.   Objective:   Physical Exam:  LMP  (LMP Unknown)   Physical Exam Constitutional:      General: She is not in acute distress.    Appearance: Normal appearance. She is well-developed.   Musculoskeletal:        General: No deformity.   Neurological:     Mental Status: She is alert and oriented to person, place, and time.     Motor: Weakness present. No tremor.     Coordination: Coordination  abnormal.     Comments: Cane use  Psychiatric:        Attention and Perception: She is attentive. She does not perceive auditory hallucinations.        Mood and Affect: Mood is anxious and depressed. Affect is not labile, blunt or inappropriate.        Speech: Speech is not slurred.        Behavior: Behavior is slowed. Behavior is not withdrawn or combative.        Thought Content: Thought content is not delusional. Thought content does not include homicidal or suicidal ideation. Thought content does not include suicidal plan.        Cognition and Memory: Cognition is impaired. She exhibits impaired recent memory.        Judgment: Judgment normal.     Comments: Insight fair affected by STM px. No auditory or visual hallucinations. Denies sadness today but has complained of some dep in last couple of weeks.Aaron Aas  anxiety remarkably improved Delusional guilt not elicited.  No specific fears elicited other than H's health. Affect pleasant and smiling today. better spontaneous speech     Lab Review:     Component Value Date/Time   NA 139 02/16/2024 1120   NA 137 12/29/2022 0000   K 3.7 02/16/2024 1120   CL 104 02/16/2024 1120   CO2 27 02/16/2024 1120   GLUCOSE 113 (H) 02/16/2024 1120   BUN 28 (H) 02/16/2024 1120   BUN 12 12/29/2022 0000   CREATININE 0.86 02/16/2024 1120   CALCIUM  9.5 02/16/2024 1120   PROT 6.5 02/16/2024 1120   PROT 6.2 04/20/2017 1151   ALBUMIN 3.9 09/02/2022 1221   ALBUMIN 3.9 04/20/2017 1151   AST 16 02/16/2024 1120   ALT 20 02/16/2024 1120   ALKPHOS 64 09/02/2022 1221   BILITOT 0.3 02/16/2024 1120   BILITOT <0.2 04/20/2017 1151   GFRNONAA >60 07/08/2022 0429   GFRNONAA 50 (L) 07/10/2020 1021   GFRAA 58 (L) 07/10/2020 1021       Component Value Date/Time   WBC 8.3 02/16/2024 1120   RBC 3.81 02/16/2024 1120   HGB 11.6 (L) 02/16/2024 1120   HGB 10.2 (L) 04/20/2017 1151   HCT 35.3 02/16/2024 1120   HCT 29.6 (L) 04/20/2017 1151   PLT 249 02/16/2024 1120    PLT 385 (H) 04/20/2017 1151   MCV 92.7  02/16/2024 1120   MCV 89 04/20/2017 1151   MCH 30.4 02/16/2024 1120   MCHC 32.9 02/16/2024 1120   RDW 13.2 02/16/2024 1120   RDW 13.9 04/20/2017 1151   LYMPHSABS 2,310 06/27/2023 1242   LYMPHSABS 1.5 04/20/2017 1151   MONOABS 0.7 07/07/2022 0458   EOSABS 58 02/16/2024 1120   EOSABS 0.3 04/20/2017 1151   BASOSABS 42 02/16/2024 1120   BASOSABS 0.0 04/20/2017 1151    Lithium  Lvl  Date Value Ref Range Status  10/21/2022 0.5 (L) 0.6 - 1.2 mmol/L Final  10/21/2022 lithium  level 0.5 on 300 mg nightly is stable.,  Nortriptyline  115 on 75 mg HS.  08/02/22 nortriptyline  85, lihtium 0.5  Nortriptyline  level 51 on 50 mg nightly with paroxetine  20 mg daily  07/10/2020 lithium  level 0.6 at [facility] office and nortriptyline  level 130 on the current dosages of lithium  300 mg nightly and nortriptyline  75 mg nightly  02/20/2021 labs lithium  0.6 on 300 mg nightly.  Nortriptyline  level 49 which is lower than expected on 75 mg nightly along with paroxetine  20 mg daily.    No results found for: PHENYTOIN, PHENOBARB, VALPROATE, CBMZ   .res Assessment: Plan:    Severe bipolar I disorder, current or most recent episode depressed, with catatonia (HCC) - Plan: modafinil (PROVIGIL) 100 MG tablet, lurasidone  60 MG TABS  Generalized anxiety disorder  Memory impairment  Insomnia due to mental condition  Essential tremor  Catatonia  OSA (obstructive sleep apnea) - Plan: modafinil (PROVIGIL) 100 MG tablet    History of lithium  toxicity repeatedly while on chlorthalidone . No tremor px off of the lithium .    30-minute visit today with  nurse present..  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  Recent decompensation and rapidly getting worse with some cognive impairment and mild psychotic sx and altered mental status. Her case has been highly complex and treatment resistant at times.  No current severe catatonia but there's a  question as to whether cognitive and verbal slowness is a mild degree vs cognitive dysfunction dT dep or ECT SE.  No lorazepam  needed..   Was worse with reduction of clonazepam  without improvement in cog with reduction. So  increased it back to 0.5 mg BID but balance issues and sleepiness so reducing back to 0.25 mg BID.  Then reduced to 0.25 mg HS without cog benefit or alertness or energy  and more HA so increased back to 0.25 mg BID   She had remarkedly improved with the ECT + change in psychiatric medicine to Latuda  60 mg daily with clonazepam  0.5 mg twice daily. But they would like to get her meds to the point she could stop ECT bc cog problems which are ongoing.  She is not ruminative.  Affect is bright.  Dep resolved including anhedonia largely with ECT.  Catatonia is resolved .  Cog is better with skipping a couple of weeks of ECT but is not normal with STM px and not very talkative socially  she can recall events of the last few days..    Short-term memory impairment is ongoing. ECT is biweekly at [facility].  Previously Disc Dr. Eligah Grow concerns about longterm ECT on cognition. Seeing neuro for work up of potential SZ episodes. 12/07/23 Neuro Dr. Rayfield Cairo reviewed EEG with the following:  Her routine and 24-hour EEG were abnormal with spikes in the bifrontal regions (maximal at right frontopolar), bilateral temporal sharp waves with triphasic morphology. She has not had any convulsive seizures, there have been periods  of catatonia lasting for several days which would be unusual for seizure, however post-ictal psychosis is a consideration.   For catatonia and anxiety and HA continueclonazepam back to 0.25 mg AM and 0.25 mg PM .  There was no benefit with less including cognition.   Consider increasing if lorazepam  trial is effective.  Continue lorazepam  0.5 mg as needed which is currently being used rarely. continue Trintellix  to 20 mg daily off label for bipolar dep.    Trial Reduced Latuda   40 mg daily for mental and physical clarity, failed.  Increase back to 60  We discussed the short-term risks associated with benzodiazepines including sedation and increased fall risk among others.  Discussed long-term side effect risk including dependence, potential withdrawal symptoms, and the potential eventual dose-related risk of dementia.  But recent studies from 2020 dispute this association between benzodiazepines and dementia risk. Newer studies in 2020 do not support an association with dementia.  Discussed potential metabolic side effects associated with atypical antipsychotics, as well as potential risk for movement side effects. Advised pt to contact office if movement side effects occur.   Recommend continue ECT  weekly as [facility] recommends. It it tends to recur between ECT if treatments are spread out.  Consider cytomel augmentation for cognition and mood, but last tSH was low normal.  options of trials of ultra-low dose lithium  Auvelity, etc. Reviewed the old chart and consider very low-dose Vraylar   FU 4 weeks  Nori Beat MD, DFAPA Please see After Visit Summary for patient specific instructions.  Increase Latuda  back to 60 mg daily.  Trial lorazepam  0.5-1 mg for catatonia.  Let me know if it helps.  Once those are done, if not better, add 1/2 modafinil each morning for 1 week and if no benefit not side effects, increase to 1 each morning.  Nori Beat, MD, DFAPA   Future Appointments  Date Time Provider Department Center  03/21/2024 12:30 PM [facility]/VAS [facility]  03/23/2024 10:30 AM Jhonny Moss, MD [facility] None  04/02/2024  7:00 AM [facility] HVT DEVICE REMOTES [facility] H&V  04/27/2024 10:00 AM Cottle, Kennedy Peabody., MD CP-CP None  05/31/2024  3:15 PM Phyllis Breeze, MD [facility] None  07/02/2024  7:00 AM CVD HVT DEVICE REMOTES [facility] H&V  09/24/2024 10:10 AM [facility]LAB [facility] MJB  10/01/2024  7:00 AM CVD HVT DEVICE REMOTES [facility] H&V  10/01/2024 10:00  AM Sylvan Evener, MD [facility] MJB  12/31/2024  7:00 AM CVD HVT DEVICE REMOTES [facility] H&V  04/01/2025  7:00 AM CVD HVT DEVICE REMOTES [facility] H&V  07/01/2025  7:00 AM CVD HVT DEVICE REMOTES [facility] H&V  09/30/2025  7:00 AM CVD HVT DEVICE REMOTES [facility] H&V      No orders of the defined types were placed in this encounter.      -------------------------------

## 2024-03-17 ENCOUNTER — Other Ambulatory Visit (HOSPITAL_BASED_OUTPATIENT_CLINIC_OR_DEPARTMENT_OTHER): Payer: Self-pay

## 2024-03-19 ENCOUNTER — Other Ambulatory Visit (HOSPITAL_BASED_OUTPATIENT_CLINIC_OR_DEPARTMENT_OTHER): Payer: Self-pay

## 2024-03-19 NOTE — Telephone Encounter (Signed)
 Okay to order follow-up CT chest without contrast

## 2024-03-19 NOTE — Telephone Encounter (Signed)
 CT has been ordered.  Patient's care giver, [name](DPR) is aware and voiced her understanding.  Nothing further needed.

## 2024-03-21 ENCOUNTER — Other Ambulatory Visit (HOSPITAL_BASED_OUTPATIENT_CLINIC_OR_DEPARTMENT_OTHER)

## 2024-03-21 DIAGNOSIS — I5022 Chronic systolic (congestive) heart failure: Secondary | ICD-10-CM

## 2024-03-21 LAB — ECHOCARDIOGRAM COMPLETE
Area-P 1/2: 2.99 cm2
Est EF: >75
MV M vel: 4.83 m/s
MV Peak grad: 93.3 mmHg
S' Lateral: 1.98 cm

## 2024-03-22 ENCOUNTER — Other Ambulatory Visit: Payer: Self-pay

## 2024-03-22 ENCOUNTER — Other Ambulatory Visit: Payer: Self-pay | Admitting: Psychiatry

## 2024-03-22 ENCOUNTER — Ambulatory Visit: Payer: Self-pay | Admitting: Internal Medicine

## 2024-03-22 ENCOUNTER — Other Ambulatory Visit (HOSPITAL_BASED_OUTPATIENT_CLINIC_OR_DEPARTMENT_OTHER): Payer: Self-pay

## 2024-03-22 ENCOUNTER — Other Ambulatory Visit (HOSPITAL_COMMUNITY): Payer: Self-pay

## 2024-03-22 ENCOUNTER — Telehealth: Payer: Self-pay

## 2024-03-22 DIAGNOSIS — G3184 Mild cognitive impairment, so stated: Secondary | ICD-10-CM

## 2024-03-22 DIAGNOSIS — F5105 Insomnia due to other mental disorder: Secondary | ICD-10-CM

## 2024-03-22 MED ORDER — VALACYCLOVIR HCL 1 G PO TABS
ORAL_TABLET | ORAL | 1 refills | Status: AC
  Filled 2024-03-22: qty 30, 15d supply, fill #0

## 2024-03-22 MED FILL — Levothyroxine Sodium Tab 75 MCG: ORAL | 90 days supply | Qty: 90 | Fill #1 | Status: CN

## 2024-03-22 NOTE — Telephone Encounter (Signed)
 Pharmacy Patient Advocate Encounter   Received notification from CoverMyMeds that prior authorization for Linzess  capsules is required/requested.   Insurance verification completed.   The patient is insured through [employer].   Per test claim:  145 mcg capsule is preferred by the insurance.  If suggested medication is appropriate, Please send in a new RX and discontinue this one. If not, please advise as to why it's not appropriate so that we may request a Prior Authorization. Please note, some preferred medications may still require a PA.  If the suggested medications have not been trialed and there are no contraindications to their use, the PA will not be submitted, as it will not be approved.

## 2024-03-22 NOTE — Telephone Encounter (Signed)
 She has diarrhea with 145 mcg daily so she take 72mcg BID.  Please send 145mcg daily X 90 days with 3 refills, inform [name], RN to try the dose, if  Mollie develops diarrhea again to open the capsule and split the dose. Thanks

## 2024-03-22 NOTE — Telephone Encounter (Signed)
 Spoke with [employer] Meghan Oliver takes Linzess  72 mcg two daily. She has not had any issues picking up the prescription. She will let us  know if she does.

## 2024-03-23 ENCOUNTER — Other Ambulatory Visit (HOSPITAL_BASED_OUTPATIENT_CLINIC_OR_DEPARTMENT_OTHER): Payer: Self-pay

## 2024-03-23 ENCOUNTER — Telehealth: Payer: Self-pay

## 2024-03-23 ENCOUNTER — Encounter: Payer: Self-pay | Admitting: Neurology

## 2024-03-23 ENCOUNTER — Other Ambulatory Visit: Payer: Self-pay

## 2024-03-23 ENCOUNTER — Ambulatory Visit (INDEPENDENT_AMBULATORY_CARE_PROVIDER_SITE_OTHER): Admitting: Neurology

## 2024-03-23 VITALS — BP 115/60 | HR 66 | Ht 60.0 in | Wt 99.0 lb

## 2024-03-23 DIAGNOSIS — G8929 Other chronic pain: Secondary | ICD-10-CM | POA: Diagnosis not present

## 2024-03-23 DIAGNOSIS — R9401 Abnormal electroencephalogram [EEG]: Secondary | ICD-10-CM

## 2024-03-23 DIAGNOSIS — R413 Other amnesia: Secondary | ICD-10-CM

## 2024-03-23 DIAGNOSIS — R519 Headache, unspecified: Secondary | ICD-10-CM | POA: Diagnosis not present

## 2024-03-23 MED ORDER — NURTEC 75 MG PO TBDP
75.0000 mg | ORAL_TABLET | ORAL | 3 refills | Status: DC
Start: 2024-03-23 — End: 2024-08-16
  Filled 2024-03-23: qty 48, 90d supply, fill #0
  Filled 2024-03-25 – 2024-04-09 (×2): qty 16, 32d supply, fill #0
  Filled 2024-04-13: qty 48, 90d supply, fill #0
  Filled 2024-07-15: qty 48, 90d supply, fill #1

## 2024-03-23 MED ORDER — MIRTAZAPINE 7.5 MG PO TABS
7.5000 mg | ORAL_TABLET | Freq: Every day | ORAL | 0 refills | Status: DC
Start: 2024-03-23 — End: 2024-06-20
  Filled 2024-03-23: qty 90, 90d supply, fill #0

## 2024-03-23 MED ORDER — MEMANTINE HCL 10 MG PO TABS
10.0000 mg | ORAL_TABLET | Freq: Two times a day (BID) | ORAL | 1 refills | Status: DC
  Filled 2024-03-23: qty 180, 90d supply, fill #0
  Filled 2024-06-20: qty 180, 90d supply, fill #1

## 2024-03-23 NOTE — Telephone Encounter (Signed)
 Approval received effective 03/23/24-03/23/25

## 2024-03-23 NOTE — Telephone Encounter (Signed)
 Prior Authorization submitted for Modafinil 100 mg with [employer]

## 2024-03-23 NOTE — Patient Instructions (Signed)
 Good to see you. Continue all your medications. Continue follow-up with Dr. Jaclynn Mast, and PCP.   FALL PRECAUTIONS: Be cautious when walking. Scan the area for obstacles that may increase the risk of trips and falls. When getting up in the mornings, sit up at the edge of the bed for a few minutes before getting out of bed. Consider elevating the bed at the head end to avoid drop of blood pressure when getting up. Walk always in a well-lit room (use night lights in the walls). Avoid area rugs or power cords from appliances in the middle of the walkways. Use a walker or a cane if necessary and consider physical therapy for balance exercise. Get your eyesight checked regularly.  HOME SAFETY: Consider the safety of the kitchen when operating appliances like stoves, microwave oven, and blender. Consider having supervision and share cooking responsibilities until no longer able to participate in those. Accidents with firearms and other hazards in the house should be identified and addressed as well.  ABILITY TO BE LEFT ALONE: If patient is unable to contact 911 operator, consider using LifeLine, or when the need is there, arrange for someone to stay with patients. Smoking is a fire hazard, consider supervision or cessation. Risk of wandering should be assessed by caregiver and if detected at any point, supervision and safe proof recommendations should be instituted.   RECOMMENDATIONS FOR ALL PATIENTS WITH MEMORY PROBLEMS: 1. Continue to exercise (Recommend 30 minutes of walking everyday, or 3 hours every week) 2. Increase social interactions - continue going to [place] and enjoy social gatherings with friends and family 3. Eat healthy, avoid fried foods and eat more fruits and vegetables 4. Maintain adequate blood pressure, blood sugar, and blood cholesterol level. Reducing the risk of stroke and cardiovascular disease also helps promoting better memory. 5. Avoid stressful situations. Live a simple life  and avoid aggravations. Organize your time and prepare for the next day in anticipation. 6. Sleep well, avoid any interruptions of sleep and avoid any distractions in the bedroom that may interfere with adequate sleep quality 7. Avoid sugar, avoid sweets as there is a strong link between excessive sugar intake, diabetes, and cognitive impairment The Mediterranean diet has been shown to help patients reduce the risk of progressive memory disorders and reduces cardiovascular risk. This includes eating fish, eat fruits and green leafy vegetables, nuts like almonds and hazelnuts, walnuts, and also use olive oil. Avoid fast foods and fried foods as much as possible. Avoid sweets and sugar as sugar use has been linked to worsening of memory function.  There is always a concern of gradual progression of memory problems. If this is the case, then we may need to adjust level of care according to patient needs. Support, both to the patient and caregiver, should then be put into place.

## 2024-03-23 NOTE — Progress Notes (Signed)
 NEUROLOGY FOLLOW UP OFFICE NOTE  Meghan Oliver [id number] [birth year]  HISTORY OF PRESENT ILLNESS: I had the pleasure of seeing Meghan Oliver in follow-up in the neurology clinic on 03/23/2024.  The patient was last seen 3 months ago for abnormal EEG in the setting of memory loss and significant depression. She is again accompanied by her husband Dr. Finn and nurse Meghan Oliver who help supplement the history today.  Records and images were personally reviewed where available. I had spoken to her psychiatrist at [facility], Dr. Verline about her EEG done 11/2023 showing occasional bifrontal spikes, maximal over the right frontopolar region, independent sharp waves over the bilateral temporal regions with triphasic morphology. She has not had any clinical symptoms of seizure that can be determined and we agreed on close clinical monitoring off seizure medication. ECT has been very helpful and lifesaving for her, we agreed on using a different headache prophylactic medication (ie not Depakote or Topiramate ) for her frequent headaches. She was started on Nurtec last month and Meghan Oliver reports it has worked well for the persistent headaches she was complaining about. She still has headaches in the morning that come and go with stress and anxiety. Headaches are substantially less and shorter, not at all debilitating, resolving once the anxiety/stress-provoking trigger is settled.  They report that her last visit in March was the last time she was quite alert/cognizant. Since then, she has intermittent hours of clarity, today is pretty good but later she will ask why they are coming here. She was having ECT every 3 weeks but when there was no change in symptoms, ECT was done at 2 week intervals, most recently last week. She would be down for a couple of days then have 1-2 days of improvement. They are not many and tapers immediately. Dr. Finn remains unsure about the ECT, but Meghan Oliver clarifies that the memory aspect does not change  based on when she has ECT, it does not seem to be making memory worse. There is a more global aspect to her memory issues, she sits at a [employer]. Dr. Geoffry reduced Latuda  but it did not give any change, she still has no motivation to do anything, soe dose was increased back to 60mg  2 weeks ago. It takes her a while to make a decision and get words out, there is very little conversation initiation, giving 1-2 word answers, worse later in the day. She would sit and feed herself in a restaurant but there is no conversation. Executive functioning can be challenging, there are days she gets dressed independently before her aide arrives because she is anxious to get up for an appointment, she would be physically capable at that point. She sleeps well at night and dozes in the daytime.    History on Initial Assessment 12/07/2023: Meghan Oliver is an 83 year old right-handed woman with multiple medical issues, seen by our Memory Disorders PA Meghan Oliver 2 months ago for worsening cognitive changes. They present today to discuss her 24-hour EEG results. Records and images available were reviewed. She had Neuropsychological testing in January 2019 with a diagnosis of moderate dementia of unspecified etiology. On initial visit with Meghan Oliver in 09/2023, her MoCA score was 12/30. As part of her evaluation, she had a brain MRI without contrast 11/2023 which I personally reviewed, no acute changes, there was mild diffuse atrophy, multifocal FLAIR signal changes in the cerebral white matter, pons, left thalamus, consistent with mild chronic microvascular disease. Her 1-hour EEG in 10/2023  showed occasional right temporal focal slowing and occasional right frontotemporal epileptiform discharges. She had a 24-hour ambulatory EEG in February which showed occasional bifrontal spikes, maximal over the right frontopolar region, independent sharp waves over the bilateral temporal regions with triphasic morphology. No electrographic  seizures seen.   She is accompanied by her husband Dr. Cloretta and her nurse Meghan Oliver, who has worked with her for the past 20 years. She reports that memory has progressively worsened over several years, she was started on Memantine  over 10 years ago. Initially, they noticed more difficulties with long-term memory. She has been treated with ECT for several years and when memory worsened, ECT treatments were stopped for 3 years with no change in memory. Her short-term memory is now much worse than before, she would forget conversations from a few minutes ago. She is having a good day today, Meghan Oliver reports that she usually has a very, very flat affect, but today she is able to answer and ask questions, smiling. Usually it is just silence. Meghan Oliver states this is as bright and engaging as she has been. She is in great shape today, 80% as good as we've gotten in the past year.   On review of records, she has a history of catatonia. Meghan Oliver reports that 10 years ago, she had an episode where she was sitting on the bedroom floor, naked, upset, speaking gibberish and not responding to them. This lasted for days, she was admitted at [facility] for 36 hours with no improvement, they brought her to [facility] where she had ECT. Meghan Oliver that ECT with medication management has been most effective for her. In January 2024, she started having worsening depression and anxiety where she was barely eating, withdrawn, with delusions. Meghan Oliver shows a video of how she was doing in 12/2022 before being brought to [facility] for ECT. She is lying in bed, restless, tremulous with eyes closed, saying she is afraid. Meghan Oliver reports hallucinations, agitation, very little engagement. Several medications, including Ketamine were tried. She was having minimal sleep with significant anxiety. She was admitted to [facility] 12/10/22 and underwent ECT. She was initially getting ECT 2-3 times a week with 50% improvement, then now gets ECT every 2 weeks, last session was  10/29/23. A few days before her next ECT treatment, she would have less engagement going 4-5 hours with no speech, answering 2-3 words but appropriate. She would be less engaged for around 9 days after ECT treatment then they start seeing improvement. Sleep is usually okay. She has tried multiple psychotropic medications in the past, she is now off Lithium . Meghan Oliver recalls she has tried Lamictal and Neurontin  in the past.   She states she does not have headaches frequently, but Dr. Cloretta and Meghan Oliver report she complains of headaches most mornings. She would have a headache after ECT, and Meghan Oliver has noticed they are often anxiety-induced as well. She reports pressure over the frontal region with no nausea/vomiting. She has always been hypersensitive to light. She asks for Tylenol , usually on a daily basis, they give her one a day. She denies any dizziness, vision changes, neck/back pain, focal numbness/tingling/weakness, urinary incontinence. She has chronic constipation and takes Linzess  for gastroparesis. She has tremors that are worse depending on her depression. She is on Primidone  which seems to help. She denies any olfactory/gustatory hallucinations, rising epigastric sensation, myoclonic jerks. She takes lorazepam  0.25mg  as needed, around twice a month when talking about something that is very stressful or anxiety-provoking. She had a normal birth and  early development.  There is no history of febrile convulsions, CNS infections such as meningitis/encephalitis, significant traumatic brain injury, neurosurgical procedures, or family history of seizures.   PAST MEDICAL HISTORY: Past Medical History:  Diagnosis Date   Anxiety    Arthritis    hips, spine (03/17/2018)   Bipolar II disorder (HCC)    CHF (congestive heart failure) (HCC)    Chronic bronchitis (HCC)    Chronic lower back pain    Chronic right hip pain    CKD (chronic kidney disease), stage II    Coronary artery disease    stent x1    Esophagitis, erosive    GAD (generalized anxiety disorder)    GERD (gastroesophageal reflux disease)    Headache    maybe monthly (03/17/2018))   Heart murmur, systolic    History of adenomatous polyp of colon    08-04-2016  tubular adenoma   History of blood transfusion 12/2017   related to vascular hematoma   History of electroconvulsive therapy    at [facility]--  started 04-15-2015 to 11-17-2016  total greater than 40 times   History of hiatal hernia    Hyperlipidemia    Hypertension    Hypothyroidism    Internal carotid artery stenosis, bilateral    per last duplex 05-01-2014  bilateral ICA 40-59%   Major depression, chronic    ECT treatments extensive and multiple started 07/ 2016   Memory loss    both short and long-term; needs frequent reminders to follow instrucitons (05/16/2017)   Migraines    none in years (03/17/2018)   OSA (obstructive sleep apnea)    per study 06/ 2012 moderate OSA  ; refuses to wear masks (03/17/2018)   Osteoporosis    Pneumonia 07/29/2022   Poor historian    due to short term memory loss   Presence of permanent cardiac pacemaker 03/17/2018   Pulmonary nodule    monitored by pcp   S/P placement of cardiac pacemaker 03/17/18 [facility]  03/18/2018   Short-term memory loss    Sick sinus syndrome (HCC)     MEDICATIONS: Current Outpatient Medications on File Prior to Visit  Medication Sig Dispense Refill   amLODipine  (NORVASC ) 10 MG tablet Take 1 tablet (10 mg total) by mouth daily. 90 tablet 1   aspirin  81 MG chewable tablet Chew 1 tablet (81 mg total) by mouth daily.     Calcium  Carbonate-Vitamin D  (CALCIUM  600/VITAMIN D  PO) Take 1 tablet by mouth 2 (two) times daily.     cetirizine (ZYRTEC) 10 MG tablet Take 10 mg by mouth as needed for allergies.     Cholecalciferol  (VITAMIN D3) 50 MCG (2000 UT) TABS Take 2,000 Units by mouth in the morning.     clonazePAM  (KLONOPIN ) 0.5 MG tablet Take 1 tablet (0.5 mg total) by mouth 2 (two) times daily. 180  tablet 0   cloNIDine  (CATAPRES ) 0.1 MG tablet Take 1 tablet (0.1 mg total) by mouth as needed prior to ECT treatment 20 tablet 2   dexlansoprazole  (DEXILANT ) 60 MG capsule Take 1 capsule (60 mg total) by mouth daily. 90 capsule 3   glycerin  adult 2 g suppository Use 1 suppository as needed if you have not had a bowel movement in  4 to 5 days 25 suppository 0   hydrochlorothiazide  (HYDRODIURIL ) 12.5 MG tablet Take 1 tablet (12.5 mg total) by mouth daily. 90 tablet 1   Krill Oil 500 MG CAPS Take 500 mg by mouth daily.     lactulose  (  CHRONULAC ) 10 GM/15ML solution Take 30 mLs (20 g total) by mouth 3 (three) times daily between meals as needed for mild constipation. 473 mL 3   Lavender Oil 80 MG CAPS Take 160 mg by mouth at bedtime.     levothyroxine  (SYNTHROID ) 75 MCG tablet Take 1 tablet (75 mcg total) by mouth daily. 90 tablet 2   linaclotide  (LINZESS ) 72 MCG capsule Take 2 capsules (145 mcg total) by mouth daily before breakfast. 180 capsule 1   LORazepam  (ATIVAN ) 0.5 MG tablet TAKE 2 TABLETS (1 MG)  BY MOUTH EVERY NIGHT AT BEDTIME AND 1 TABLET EVERY 4 HOURS AS NEEDED FOR ANXIETY 270 tablet 0   losartan  (COZAAR ) 100 MG tablet Take 1 tablet (100 mg total) by mouth daily. 90 tablet 1   Lurasidone  HCl 60 MG TABS Take 1 tablet (60 mg total) by mouth daily with breakfast. 30 tablet 1   magic mouthwash w/lidocaine  SOLN Take 5 mLs by mouth 3 (three) times daily as needed for mouth pain. 120 mL 0   memantine  (NAMENDA ) 10 MG tablet Take 1 tablet (10 mg total) by mouth 2 (two) times daily. 180 tablet 1   metoprolol  tartrate (LOPRESSOR ) 25 MG tablet Take 1 tablet (25 mg total) by mouth as needed. 45 tablet 0   mirtazapine  (REMERON ) 7.5 MG tablet Take 1 tablet (7.5 mg total) by mouth at bedtime. 90 tablet 0   Multiple Vitamins-Minerals (MULTIVITAMIN WITH MINERALS) tablet Take 1 tablet by mouth daily.     Multiple Vitamins-Minerals (PRESERVISION AREDS 2 PO) Take 1 capsule by mouth in the morning and at bedtime.      neomycin -polymyxin b-dexamethasone  (MAXITROL ) 3.5-10000-0.1 OINT Apply thin line (1 cm) of ointment to right eye twice daily for 1 week. (Patient taking differently: as needed.) 3.5 g 1   neomycin -polymyxin-dexameth (MAXITROL ) 0.1 % OINT apply a small amount into the affected eye twice a day for 1 week (Patient taking differently: as needed. apply a small amount into the affected eye twice a day for 1 week) 3.5 g 1   ofloxacin  (OCUFLOX ) 0.3 % ophthalmic solution Instill 1 drop into affected eye three times daily for 5 days. 5 mL 3   omeprazole -sodium bicarbonate  (ZEGERID ) 40-1100 MG capsule Take 1 capsule by mouth daily before breakfast. (Patient taking differently: Take 1 capsule by mouth as needed.) 90 capsule 3   ondansetron  (ZOFRAN -ODT) 4 MG disintegrating tablet Place 1 tablet every 6 hours by translingual route as needed. 20 tablet 0   polyethylene glycol (MIRALAX  / GLYCOLAX ) 17 g packet Take 17 g by mouth as needed for mild constipation.     primidone  (MYSOLINE ) 50 MG tablet Take 2 tablets (100 mg total) by mouth every morning AND 1 tablet (50 mg total) at bedtime. 270 tablet 0   Rimegepant Sulfate  (NURTEC) 75 MG TBDP Take 1 tablet (75 mg total) by mouth every other day for headache prevention 16 tablet 5   sennosides-docusate sodium  (SENOKOT-S) 8.6-50 MG tablet Take 2 tablets by mouth as needed.     thiamine  (VITAMIN B-1) 100 MG tablet Take 1 tablet (100 mg total) by mouth daily. 100 tablet 3   valACYclovir  (VALTREX ) 1000 MG tablet Take 2 tablets (2,000 mg total) by mouth at onset, then take 2 tablets 12 hours later. 30 tablet 1   vitamin B-12 (CYANOCOBALAMIN ) 1000 MCG tablet Take 1,000 mcg by mouth at bedtime.     vortioxetine  HBr (TRINTELLIX ) 20 MG TABS tablet Take 1 tablet (20 mg total) by mouth daily. 90  tablet 0   levothyroxine  (SYNTHROID ) 75 MCG tablet Take 1 tablet (75 mcg total) by mouth daily. 90 tablet 2   modafinil  (PROVIGIL ) 100 MG tablet 1/2 tablet each morning for 1 week then  1 each AM (Patient not taking: Reported on 03/23/2024) 30 tablet 0   No current facility-administered medications on file prior to visit.    ALLERGIES: Allergies  Allergen Reactions   Propranolol Other (See Comments)    Low blood pressure. Bradycardia    Propranolol Hcl Other (See Comments)   Brexpiprazole Other (See Comments)    Aphasia and catatonia    FAMILY HISTORY: Family History  Problem Relation Age of Onset   Heart attack Father 3       deceased   Hypertension Father    Heart disease Father    Dementia Brother    Parkinson's disease Brother    Heart disease Brother    Breast cancer Paternal Aunt        Age 29's   Breast cancer Paternal Grandmother        Age unknown   Colon cancer Neg Hx     SOCIAL HISTORY: Social History   Socioeconomic History   Marital status: Married    Spouse name: Dr. Sheppard Finn   Number of children: 2   Years of education: 15   Highest education level: Not on file  Occupational History   Occupation: housewife    Employer: UNEMPLOYED  Tobacco Use   Smoking status: Former    Current packs/day: 0.00    Average packs/day: 2.0 packs/day for 15.0 years (30.0 ttl pk-yrs)    Types: Cigarettes    Start date: 01/13/1956    Quit date: 01/13/1971    Years since quitting: 53.2    Passive exposure: Never   Smokeless tobacco: Never  Vaping Use   Vaping status: Never Used  Substance and Sexual Activity   Alcohol use: Yes    Comment: occassional 1 x a week   Drug use: Never   Sexual activity: Not Currently    Comment: intercourse age 52, sexual partners less than 5  Other Topics Concern   Not on file  Social History Narrative   Right handed   Drinks caffeine  no   Single home at [employer]   Social Drivers of Health   Financial Resource Strain: Low Risk  (12/12/2022)   Received from [facility]   Overall Financial Resource Strain (CARDIA)    Difficulty of Paying Living Expenses: Not hard at all  Food Insecurity:  No Food Insecurity (12/12/2022)   Received from [facility]   Hunger Vital Sign    Within the past 12 months, you worried that your food would run out before you got the money to buy more.: Never true    Within the past 12 months, the food you bought just didn't last and you didn't have money to get more.: Never true  Transportation Needs: No Transportation Needs (12/12/2022)   Received from [facility] - Transportation    In the past 12 months, has lack of transportation kept you from medical appointments or from getting medications?: No    Lack of Transportation (Non-Medical): No  Physical Activity: Not on file  Stress: Not on file  Social Connections: Not on file  Intimate Partner Violence: Not At Risk (07/06/2022)   Humiliation, Afraid, Rape, and Kick questionnaire    Fear of Current or Ex-Partner: No    Emotionally Abused:  No    Physically Abused: No    Sexually Abused: No     PHYSICAL EXAM: Vitals:   03/23/24 1015  BP: 115/60  Pulse: 66  SpO2: 96%   General: No acute distress Head:  Normocephalic/atraumatic Skin/Extremities: No rash, no edema Neurological Exam: alert and oriented to person, place, and time. Initially answering in whispers but improved over the course of visit. There is no spontaneous verbal output, she smiles when spoken to. Recent and remote memory are impaired, 0/3 delayed recall. Attention and concentration are normal, 5/5 WORLD backwards.  Cranial nerves: Pupils equal, round. Extraocular movements intact with no nystagmus. Visual fields full.  No facial asymmetry.  Motor: No cogwheeling on right, ?slightly increased tone on left. Muscle strength 5/5 throughout with no pronator drift.   Finger to nose testing intact.  Gait she ambulates with a cane with normal stride, no ataxia. No tremors in office today.    IMPRESSION: Meghan Oliver is an 83 yo RH woman with multiple medical issues, who presented for worsening  cognitive changes. As part of her evaluation, she had a brain MRI and EEG. Brain MRI no acute changes, there is mild chronic microvascular disease. Her routine and 24-hour EEG were abnormal with spikes in the bifrontal regions (maximal at right frontopolar), bilateral temporal sharp waves with triphasic morphology. She has not had any clinical signs of a seizure, there have been periods of catatonia lasting for several days which would be unusual for seizure, I had spoken to her psychiatrist at [facility] and we agreed to monitor symptoms and if similar catatonia recurs, EEG will be done. She has had a good response to Nurtec with less frequent and severe headaches. Continue follow-up with Behavioral Health, continue 24/7 care. Follow-up in 6 months, call for any changes.   Thank you for allowing me to participate in her care.  Please do not hesitate to call for any questions or concerns.    Darice Shivers, M.D.   CC: Dr. Perri, Dr. Geoffry

## 2024-03-24 ENCOUNTER — Other Ambulatory Visit: Payer: Self-pay

## 2024-03-25 ENCOUNTER — Other Ambulatory Visit: Payer: Self-pay | Admitting: Psychiatry

## 2024-03-25 DIAGNOSIS — F3132 Bipolar disorder, current episode depressed, moderate: Secondary | ICD-10-CM

## 2024-03-26 ENCOUNTER — Other Ambulatory Visit (HOSPITAL_BASED_OUTPATIENT_CLINIC_OR_DEPARTMENT_OTHER): Payer: Self-pay

## 2024-03-26 MED ORDER — PRIMIDONE 50 MG PO TABS
ORAL_TABLET | ORAL | 0 refills | Status: DC
  Filled 2024-03-26 – 2024-04-18 (×3): qty 270, 90d supply, fill #0

## 2024-03-26 NOTE — Telephone Encounter (Signed)
 Pharmacy Patient Advocate Encounter  Received notification from [facility] [employer] that Prior Authorization for Linzess  capsules has been APPROVED from 03-23-2024 to 03-23-2025   PA #/Case ID/Reference #: [id number]

## 2024-03-27 ENCOUNTER — Other Ambulatory Visit (HOSPITAL_BASED_OUTPATIENT_CLINIC_OR_DEPARTMENT_OTHER): Payer: Self-pay

## 2024-03-28 DIAGNOSIS — E039 Hypothyroidism, unspecified: Secondary | ICD-10-CM | POA: Diagnosis not present

## 2024-03-28 DIAGNOSIS — F319 Bipolar disorder, unspecified: Secondary | ICD-10-CM | POA: Diagnosis not present

## 2024-03-28 DIAGNOSIS — J449 Chronic obstructive pulmonary disease, unspecified: Secondary | ICD-10-CM | POA: Diagnosis not present

## 2024-03-28 DIAGNOSIS — F3113 Bipolar disorder, current episode manic without psychotic features, severe: Secondary | ICD-10-CM | POA: Diagnosis not present

## 2024-03-28 DIAGNOSIS — I5030 Unspecified diastolic (congestive) heart failure: Secondary | ICD-10-CM | POA: Diagnosis not present

## 2024-03-28 DIAGNOSIS — G4730 Sleep apnea, unspecified: Secondary | ICD-10-CM | POA: Diagnosis not present

## 2024-03-28 DIAGNOSIS — I2510 Atherosclerotic heart disease of native coronary artery without angina pectoris: Secondary | ICD-10-CM | POA: Diagnosis not present

## 2024-03-28 DIAGNOSIS — N189 Chronic kidney disease, unspecified: Secondary | ICD-10-CM | POA: Diagnosis not present

## 2024-03-28 DIAGNOSIS — F419 Anxiety disorder, unspecified: Secondary | ICD-10-CM | POA: Diagnosis not present

## 2024-03-30 ENCOUNTER — Ambulatory Visit (HOSPITAL_COMMUNITY)

## 2024-03-30 DIAGNOSIS — H353211 Exudative age-related macular degeneration, right eye, with active choroidal neovascularization: Secondary | ICD-10-CM | POA: Diagnosis not present

## 2024-03-30 DIAGNOSIS — H353122 Nonexudative age-related macular degeneration, left eye, intermediate dry stage: Secondary | ICD-10-CM | POA: Diagnosis not present

## 2024-03-30 DIAGNOSIS — H35362 Drusen (degenerative) of macula, left eye: Secondary | ICD-10-CM | POA: Diagnosis not present

## 2024-04-02 ENCOUNTER — Other Ambulatory Visit (HOSPITAL_BASED_OUTPATIENT_CLINIC_OR_DEPARTMENT_OTHER): Payer: Self-pay

## 2024-04-02 ENCOUNTER — Ambulatory Visit (HOSPITAL_BASED_OUTPATIENT_CLINIC_OR_DEPARTMENT_OTHER)

## 2024-04-09 ENCOUNTER — Other Ambulatory Visit (HOSPITAL_BASED_OUTPATIENT_CLINIC_OR_DEPARTMENT_OTHER): Payer: Self-pay

## 2024-04-11 DIAGNOSIS — G8929 Other chronic pain: Secondary | ICD-10-CM | POA: Diagnosis not present

## 2024-04-11 DIAGNOSIS — F3113 Bipolar disorder, current episode manic without psychotic features, severe: Secondary | ICD-10-CM | POA: Diagnosis not present

## 2024-04-11 DIAGNOSIS — I5030 Unspecified diastolic (congestive) heart failure: Secondary | ICD-10-CM | POA: Diagnosis not present

## 2024-04-11 DIAGNOSIS — M25559 Pain in unspecified hip: Secondary | ICD-10-CM | POA: Diagnosis not present

## 2024-04-11 DIAGNOSIS — F419 Anxiety disorder, unspecified: Secondary | ICD-10-CM | POA: Diagnosis not present

## 2024-04-11 DIAGNOSIS — M1990 Unspecified osteoarthritis, unspecified site: Secondary | ICD-10-CM | POA: Diagnosis not present

## 2024-04-11 DIAGNOSIS — I5032 Chronic diastolic (congestive) heart failure: Secondary | ICD-10-CM | POA: Diagnosis not present

## 2024-04-11 DIAGNOSIS — G4730 Sleep apnea, unspecified: Secondary | ICD-10-CM | POA: Diagnosis not present

## 2024-04-11 DIAGNOSIS — I080 Rheumatic disorders of both mitral and aortic valves: Secondary | ICD-10-CM | POA: Diagnosis not present

## 2024-04-11 DIAGNOSIS — M549 Dorsalgia, unspecified: Secondary | ICD-10-CM | POA: Diagnosis not present

## 2024-04-11 DIAGNOSIS — N189 Chronic kidney disease, unspecified: Secondary | ICD-10-CM | POA: Diagnosis not present

## 2024-04-11 DIAGNOSIS — I130 Hypertensive heart and chronic kidney disease with heart failure and stage 1 through stage 4 chronic kidney disease, or unspecified chronic kidney disease: Secondary | ICD-10-CM | POA: Diagnosis not present

## 2024-04-11 DIAGNOSIS — K219 Gastro-esophageal reflux disease without esophagitis: Secondary | ICD-10-CM | POA: Diagnosis not present

## 2024-04-11 DIAGNOSIS — J449 Chronic obstructive pulmonary disease, unspecified: Secondary | ICD-10-CM | POA: Diagnosis not present

## 2024-04-11 DIAGNOSIS — I2510 Atherosclerotic heart disease of native coronary artery without angina pectoris: Secondary | ICD-10-CM | POA: Diagnosis not present

## 2024-04-11 DIAGNOSIS — E039 Hypothyroidism, unspecified: Secondary | ICD-10-CM | POA: Diagnosis not present

## 2024-04-11 DIAGNOSIS — Z950 Presence of cardiac pacemaker: Secondary | ICD-10-CM | POA: Diagnosis not present

## 2024-04-12 ENCOUNTER — Ambulatory Visit (HOSPITAL_BASED_OUTPATIENT_CLINIC_OR_DEPARTMENT_OTHER)

## 2024-04-13 ENCOUNTER — Other Ambulatory Visit: Payer: Self-pay

## 2024-04-13 ENCOUNTER — Other Ambulatory Visit (HOSPITAL_BASED_OUTPATIENT_CLINIC_OR_DEPARTMENT_OTHER): Payer: Self-pay

## 2024-04-18 ENCOUNTER — Other Ambulatory Visit (HOSPITAL_BASED_OUTPATIENT_CLINIC_OR_DEPARTMENT_OTHER): Payer: Self-pay

## 2024-04-18 ENCOUNTER — Other Ambulatory Visit: Payer: Self-pay

## 2024-04-18 MED FILL — Levothyroxine Sodium Tab 75 MCG: ORAL | 90 days supply | Qty: 90 | Fill #1 | Status: CN

## 2024-04-23 ENCOUNTER — Ambulatory Visit (HOSPITAL_BASED_OUTPATIENT_CLINIC_OR_DEPARTMENT_OTHER)
Admission: RE | Admit: 2024-04-23 | Discharge: 2024-04-23 | Disposition: A | Discharge status: Home / Self Care | Source: Ambulatory Visit | Attending: Pulmonary Disease | Admitting: Pulmonary Disease

## 2024-04-23 ENCOUNTER — Encounter: Payer: Self-pay | Admitting: Internal Medicine

## 2024-04-23 ENCOUNTER — Ambulatory Visit: Admitting: Internal Medicine

## 2024-04-23 VITALS — BP 120/62 | HR 65 | Temp 98.2°F | Ht 60.0 in | Wt 101.0 lb

## 2024-04-23 DIAGNOSIS — N644 Mastodynia: Secondary | ICD-10-CM

## 2024-04-23 DIAGNOSIS — T8543XA Leakage of breast prosthesis and implant, initial encounter: Secondary | ICD-10-CM

## 2024-04-23 DIAGNOSIS — Z9882 Breast implant status: Secondary | ICD-10-CM | POA: Diagnosis not present

## 2024-04-23 DIAGNOSIS — I10 Essential (primary) hypertension: Secondary | ICD-10-CM

## 2024-04-23 DIAGNOSIS — T17908A Unspecified foreign body in respiratory tract, part unspecified causing other injury, initial encounter: Secondary | ICD-10-CM | POA: Diagnosis not present

## 2024-04-23 DIAGNOSIS — F3181 Bipolar II disorder: Secondary | ICD-10-CM

## 2024-04-23 DIAGNOSIS — J90 Pleural effusion, not elsewhere classified: Secondary | ICD-10-CM | POA: Diagnosis not present

## 2024-04-23 DIAGNOSIS — E039 Hypothyroidism, unspecified: Secondary | ICD-10-CM

## 2024-04-23 DIAGNOSIS — Z950 Presence of cardiac pacemaker: Secondary | ICD-10-CM

## 2024-04-23 DIAGNOSIS — R918 Other nonspecific abnormal finding of lung field: Secondary | ICD-10-CM | POA: Diagnosis not present

## 2024-04-23 DIAGNOSIS — R413 Other amnesia: Secondary | ICD-10-CM

## 2024-04-23 NOTE — Progress Notes (Signed)
 Patient Care Team: Perri Ronal PARAS, MD as PCP - General (Internal Medicine) Rolan Ezra RAMAN, MD as PCP - Cardiology (Cardiology) Waddell Danelle ORN, MD as PCP - Electrophysiology (Cardiology) Morris Debby BIRCH, MD (Cardiology) Cottle, Lorene KANDICE Raddle., MD (Psychiatry) Norval Toribio CROME, MD (Inactive) (Obstetrics and Gynecology) Shila Gustav GAILS, MD as Consulting Physician (Gastroenterology) Georjean Darice HERO, MD as Consulting Physician (Neurology)  Visit Date: 04/23/24  Subjective:   Chief Complaint  Patient presents with   left breast tenderness    Last week started having left breast tenderness and pain, patient has implants from years ago does not know exact date. Last mammogram on 10/19/2023.    Patient PI:Meghan Oliver,Female DOB:1941/09/04,83 y.o. FMW:[id number]   83 y.o.Female, ambulating with a cane and is accompanied by her personal nurse/caregiver [name], presents today for acute visit with Left Breast Tenderness. Patient has a past medical history of Hypertension; Bipolar Disorder; Hyperlipidemia; Hypothyroidism; CAD; Elevated LFTs; S/p Placement of Pacemaker; Memory Loss. Says that she noticed tenderness last week. Mammogram 10/19/2023 normal with note breasts are heterogeneously dense bilaterally, which may obscure small masses and saline implants stable in appearance. Implantation date unknown, at least since 2009, which is when we 1st have records of mammograms under [facility].  Past Medical History:  Diagnosis Date   Anxiety    Arthritis    hips, spine (03/17/2018)   Bipolar II disorder (HCC)    CHF (congestive heart failure) (HCC)    Chronic bronchitis (HCC)    Chronic lower back pain    Chronic right hip pain    CKD (chronic kidney disease), stage II    Coronary artery disease    stent x1   Esophagitis, erosive    GAD (generalized anxiety disorder)    GERD (gastroesophageal reflux disease)    Headache    maybe monthly (03/17/2018))   Heart murmur, systolic     History of adenomatous polyp of colon    08-04-2016  tubular adenoma   History of blood transfusion 12/2017   related to vascular hematoma   History of electroconvulsive therapy    at [facility]--  started 04-15-2015 to 11-17-2016  total greater than 40 times   History of hiatal hernia    Hyperlipidemia    Hypertension    Hypothyroidism    Internal carotid artery stenosis, bilateral    per last duplex 05-01-2014  bilateral ICA 40-59%   Major depression, chronic    ECT treatments extensive and multiple started 07/ 2016   Memory loss    both short and long-term; needs frequent reminders to follow instrucitons (05/16/2017)   Migraines    none in years (03/17/2018)   OSA (obstructive sleep apnea)    per study 06/ 2012 moderate OSA  ; refuses to wear masks (03/17/2018)   Osteoporosis    Pneumonia 07/29/2022   Poor historian    due to short term memory loss   Presence of permanent cardiac pacemaker 03/17/2018   Pulmonary nodule    monitored by pcp   S/P placement of cardiac pacemaker 03/17/18 [facility]  03/18/2018   Short-term memory loss    Sick sinus syndrome (HCC)     Allergies  Allergen Reactions   Propranolol Other (See Comments)    Low blood pressure. Bradycardia    Propranolol Hcl Other (See Comments)   Brexpiprazole Other (See Comments)    Aphasia and catatonia    Family History  Problem Relation Age of Onset   Heart attack Father 75  deceased   Hypertension Father    Heart disease Father    Dementia Brother    Parkinson's disease Brother    Heart disease Brother    Breast cancer Paternal Aunt        Age 67's   Breast cancer Paternal Grandmother        Age unknown   Colon cancer Neg Hx    Social History   Social History Narrative   Right handed   Drinks caffeine  no   Single home at [employer]   Review of Systems  Skin:        (+) Left Breast Tenderness  All other systems reviewed and are negative.    Objective:  Vitals: BP 120/62   Pulse 65    Temp 98.2 F (36.8 C)   Ht 5' (1.524 m)   Wt 101 lb (45.8 kg)   LMP  (LMP Unknown)   SpO2 98%   BMI 19.73 kg/m   Physical Exam Vitals and nursing note reviewed.  Constitutional:      General: She is not in acute distress.    Appearance: Normal appearance. She is not toxic-appearing.  HENT:     Head: Normocephalic and atraumatic.  Pulmonary:     Effort: Pulmonary effort is normal.  Chest:  Breasts:    Right: Tenderness present. No mass.     Comments: Tenderness at 11 o'clock of left upper-outer breast; crepitus palpable Skin:    General: Skin is warm and dry.  Neurological:     Mental Status: She is alert and oriented to person, place, and time. Mental status is at baseline.  Psychiatric:        Mood and Affect: Mood normal.        Behavior: Behavior normal.        Thought Content: Thought content normal.        Judgment: Judgment normal.     Results:  Studies Obtained And Personally Reviewed By Me:  Mammogram 10/19/2023 normal with note breasts are heterogeneously dense bilaterally, which may obscure small masses and saline implants stable in appearance. Implantation date unknown, at least since 2012, which is when we first have records of mammograms.  Labs:     Component Value Date/Time   NA 139 02/16/2024 1120   NA 137 12/29/2022 0000   K 3.7 02/16/2024 1120   CL 104 02/16/2024 1120   CO2 27 02/16/2024 1120   GLUCOSE 113 (H) 02/16/2024 1120   BUN 28 (H) 02/16/2024 1120   BUN 12 12/29/2022 0000   CREATININE 0.86 02/16/2024 1120   CALCIUM  9.5 02/16/2024 1120   PROT 6.5 02/16/2024 1120   PROT 6.2 04/20/2017 1151   ALBUMIN 3.9 09/02/2022 1221   ALBUMIN 3.9 04/20/2017 1151   AST 16 02/16/2024 1120   ALT 20 02/16/2024 1120   ALKPHOS 64 09/02/2022 1221   BILITOT 0.3 02/16/2024 1120   BILITOT <0.2 04/20/2017 1151   GFRNONAA >60 07/08/2022 0429   GFRNONAA 50 (L) 07/10/2020 1021   GFRAA 58 (L) 07/10/2020 1021    Lab Results  Component Value Date   WBC 8.3  02/16/2024   HGB 11.6 (L) 02/16/2024   HCT 35.3 02/16/2024   MCV 92.7 02/16/2024   PLT 249 02/16/2024   Lab Results  Component Value Date   CHOL 211 (H) 03/05/2022   HDL 118 03/05/2022   LDLCALC 79 03/05/2022   LDLDIRECT 98.2 04/10/2007   TRIG 49 03/05/2022   CHOLHDL 1.8 03/05/2022   Lab Results  Component Value Date   HGBA1C 5.1 02/15/2011    Lab Results  Component Value Date   TSH 0.84 02/16/2024    Assessment & Plan:   Left Breast Tenderness: Hx of Bilateral Breast Augmentation w/ Saline Implants. Noticed tenderness last week. Mammogram 10/19/2023 normal with note breasts are heterogeneously dense bilaterally, which may obscure small masses and saline implants stable in appearance. Implantation date unknown, at least since 2009, which is when we 1st have records of mammograms under [facility]. On physical exam,tenderness reproducible on palpation to 11 o'clock of left breast, upper-outer quadrant w/ palpable crepitus. Worrisome for implant rupture. Will  Refer to [facility] for evaluation as soon as possible.     I,Emily Lagle,acting as a Neurosurgeon for Ronal JINNY Hailstone, MD.,have documented all relevant documentation on the behalf of Ronal JINNY Hailstone, MD,as directed by  Ronal JINNY Hailstone, MD while in the presence of Ronal JINNY Hailstone, MD.   I, Ronal JINNY Hailstone, MD, have reviewed all documentation for this visit. The documentation on 04/23/24 for the exam, diagnosis, procedures, and orders are all accurate and complete.

## 2024-04-23 NOTE — Patient Instructions (Addendum)
 Referring urgently to [facility] for evaluation of possible left implant rupture.

## 2024-04-27 ENCOUNTER — Ambulatory Visit: Admitting: Psychiatry

## 2024-04-30 ENCOUNTER — Ambulatory Visit: Payer: Self-pay | Admitting: Pulmonary Disease

## 2024-05-01 DIAGNOSIS — I5032 Chronic diastolic (congestive) heart failure: Secondary | ICD-10-CM | POA: Diagnosis not present

## 2024-05-01 DIAGNOSIS — E039 Hypothyroidism, unspecified: Secondary | ICD-10-CM | POA: Diagnosis not present

## 2024-05-01 DIAGNOSIS — F419 Anxiety disorder, unspecified: Secondary | ICD-10-CM | POA: Diagnosis not present

## 2024-05-01 DIAGNOSIS — K219 Gastro-esophageal reflux disease without esophagitis: Secondary | ICD-10-CM | POA: Diagnosis not present

## 2024-05-01 DIAGNOSIS — N189 Chronic kidney disease, unspecified: Secondary | ICD-10-CM | POA: Diagnosis not present

## 2024-05-01 DIAGNOSIS — F3113 Bipolar disorder, current episode manic without psychotic features, severe: Secondary | ICD-10-CM | POA: Diagnosis not present

## 2024-05-01 DIAGNOSIS — J449 Chronic obstructive pulmonary disease, unspecified: Secondary | ICD-10-CM | POA: Diagnosis not present

## 2024-05-01 DIAGNOSIS — G4730 Sleep apnea, unspecified: Secondary | ICD-10-CM | POA: Diagnosis not present

## 2024-05-01 DIAGNOSIS — I2510 Atherosclerotic heart disease of native coronary artery without angina pectoris: Secondary | ICD-10-CM | POA: Diagnosis not present

## 2024-05-15 ENCOUNTER — Other Ambulatory Visit: Payer: Self-pay | Admitting: Psychiatry

## 2024-05-15 ENCOUNTER — Other Ambulatory Visit (HOSPITAL_BASED_OUTPATIENT_CLINIC_OR_DEPARTMENT_OTHER): Payer: Self-pay

## 2024-05-15 ENCOUNTER — Other Ambulatory Visit: Payer: Self-pay

## 2024-05-15 DIAGNOSIS — F061 Catatonic disorder due to known physiological condition: Secondary | ICD-10-CM

## 2024-05-15 MED ORDER — LURASIDONE HCL 60 MG PO TABS
60.0000 mg | ORAL_TABLET | Freq: Every day | ORAL | 0 refills | Status: DC
  Filled 2024-05-15: qty 90, 90d supply, fill #0

## 2024-05-19 ENCOUNTER — Other Ambulatory Visit: Payer: Self-pay | Admitting: Psychiatry

## 2024-05-19 DIAGNOSIS — G4733 Obstructive sleep apnea (adult) (pediatric): Secondary | ICD-10-CM

## 2024-05-19 DIAGNOSIS — F061 Catatonic disorder due to known physiological condition: Secondary | ICD-10-CM

## 2024-05-21 ENCOUNTER — Other Ambulatory Visit: Payer: Self-pay

## 2024-05-21 ENCOUNTER — Other Ambulatory Visit (HOSPITAL_BASED_OUTPATIENT_CLINIC_OR_DEPARTMENT_OTHER): Payer: Self-pay

## 2024-05-21 DIAGNOSIS — Z951 Presence of aortocoronary bypass graft: Secondary | ICD-10-CM | POA: Diagnosis not present

## 2024-05-21 DIAGNOSIS — Z888 Allergy status to other drugs, medicaments and biological substances status: Secondary | ICD-10-CM | POA: Diagnosis not present

## 2024-05-21 DIAGNOSIS — Z79899 Other long term (current) drug therapy: Secondary | ICD-10-CM | POA: Diagnosis not present

## 2024-05-21 DIAGNOSIS — I130 Hypertensive heart and chronic kidney disease with heart failure and stage 1 through stage 4 chronic kidney disease, or unspecified chronic kidney disease: Secondary | ICD-10-CM | POA: Diagnosis not present

## 2024-05-21 DIAGNOSIS — I340 Nonrheumatic mitral (valve) insufficiency: Secondary | ICD-10-CM | POA: Diagnosis not present

## 2024-05-21 DIAGNOSIS — Z7982 Long term (current) use of aspirin: Secondary | ICD-10-CM | POA: Diagnosis not present

## 2024-05-21 DIAGNOSIS — F202 Catatonic schizophrenia: Secondary | ICD-10-CM | POA: Diagnosis not present

## 2024-05-21 DIAGNOSIS — Z950 Presence of cardiac pacemaker: Secondary | ICD-10-CM | POA: Diagnosis not present

## 2024-05-21 DIAGNOSIS — G4730 Sleep apnea, unspecified: Secondary | ICD-10-CM | POA: Diagnosis not present

## 2024-05-21 DIAGNOSIS — F419 Anxiety disorder, unspecified: Secondary | ICD-10-CM | POA: Diagnosis not present

## 2024-05-21 DIAGNOSIS — Z955 Presence of coronary angioplasty implant and graft: Secondary | ICD-10-CM | POA: Diagnosis not present

## 2024-05-21 DIAGNOSIS — I495 Sick sinus syndrome: Secondary | ICD-10-CM | POA: Diagnosis not present

## 2024-05-21 DIAGNOSIS — J449 Chronic obstructive pulmonary disease, unspecified: Secondary | ICD-10-CM | POA: Diagnosis not present

## 2024-05-21 DIAGNOSIS — E039 Hypothyroidism, unspecified: Secondary | ICD-10-CM | POA: Diagnosis not present

## 2024-05-21 DIAGNOSIS — K219 Gastro-esophageal reflux disease without esophagitis: Secondary | ICD-10-CM | POA: Diagnosis not present

## 2024-05-21 DIAGNOSIS — F3113 Bipolar disorder, current episode manic without psychotic features, severe: Secondary | ICD-10-CM | POA: Diagnosis not present

## 2024-05-21 DIAGNOSIS — I2510 Atherosclerotic heart disease of native coronary artery without angina pectoris: Secondary | ICD-10-CM | POA: Diagnosis not present

## 2024-05-21 DIAGNOSIS — N189 Chronic kidney disease, unspecified: Secondary | ICD-10-CM | POA: Diagnosis not present

## 2024-05-21 DIAGNOSIS — I5032 Chronic diastolic (congestive) heart failure: Secondary | ICD-10-CM | POA: Diagnosis not present

## 2024-05-21 DIAGNOSIS — I351 Nonrheumatic aortic (valve) insufficiency: Secondary | ICD-10-CM | POA: Diagnosis not present

## 2024-05-21 MED ORDER — MODAFINIL 100 MG PO TABS
ORAL_TABLET | ORAL | 0 refills | Status: DC
  Filled 2024-05-21: qty 90, 90d supply, fill #0

## 2024-05-31 ENCOUNTER — Ambulatory Visit: Admitting: Pulmonary Disease

## 2024-06-07 ENCOUNTER — Encounter: Payer: Self-pay | Admitting: Internal Medicine

## 2024-06-15 DIAGNOSIS — H353211 Exudative age-related macular degeneration, right eye, with active choroidal neovascularization: Secondary | ICD-10-CM | POA: Diagnosis not present

## 2024-06-19 DIAGNOSIS — K219 Gastro-esophageal reflux disease without esophagitis: Secondary | ICD-10-CM | POA: Diagnosis not present

## 2024-06-19 DIAGNOSIS — F3113 Bipolar disorder, current episode manic without psychotic features, severe: Secondary | ICD-10-CM | POA: Diagnosis not present

## 2024-06-19 DIAGNOSIS — I2510 Atherosclerotic heart disease of native coronary artery without angina pectoris: Secondary | ICD-10-CM | POA: Diagnosis not present

## 2024-06-19 DIAGNOSIS — N189 Chronic kidney disease, unspecified: Secondary | ICD-10-CM | POA: Diagnosis not present

## 2024-06-19 DIAGNOSIS — G4730 Sleep apnea, unspecified: Secondary | ICD-10-CM | POA: Diagnosis not present

## 2024-06-19 DIAGNOSIS — F419 Anxiety disorder, unspecified: Secondary | ICD-10-CM | POA: Diagnosis not present

## 2024-06-19 DIAGNOSIS — E039 Hypothyroidism, unspecified: Secondary | ICD-10-CM | POA: Diagnosis not present

## 2024-06-19 DIAGNOSIS — J449 Chronic obstructive pulmonary disease, unspecified: Secondary | ICD-10-CM | POA: Diagnosis not present

## 2024-06-19 DIAGNOSIS — I5032 Chronic diastolic (congestive) heart failure: Secondary | ICD-10-CM | POA: Diagnosis not present

## 2024-06-20 ENCOUNTER — Other Ambulatory Visit: Payer: Self-pay | Admitting: Psychiatry

## 2024-06-20 ENCOUNTER — Other Ambulatory Visit: Payer: Self-pay

## 2024-06-20 ENCOUNTER — Other Ambulatory Visit (HOSPITAL_BASED_OUTPATIENT_CLINIC_OR_DEPARTMENT_OTHER): Payer: Self-pay

## 2024-06-20 DIAGNOSIS — F5105 Insomnia due to other mental disorder: Secondary | ICD-10-CM

## 2024-06-20 MED ORDER — MIRTAZAPINE 7.5 MG PO TABS
7.5000 mg | ORAL_TABLET | Freq: Every day | ORAL | 0 refills | Status: DC
  Filled 2024-06-20: qty 90, 90d supply, fill #0

## 2024-06-21 ENCOUNTER — Other Ambulatory Visit (HOSPITAL_BASED_OUTPATIENT_CLINIC_OR_DEPARTMENT_OTHER): Payer: Self-pay

## 2024-06-21 ENCOUNTER — Other Ambulatory Visit: Payer: Self-pay | Admitting: Internal Medicine

## 2024-06-21 ENCOUNTER — Other Ambulatory Visit: Payer: Self-pay

## 2024-06-21 ENCOUNTER — Other Ambulatory Visit: Payer: Self-pay | Admitting: Psychiatry

## 2024-06-21 DIAGNOSIS — F411 Generalized anxiety disorder: Secondary | ICD-10-CM

## 2024-06-21 DIAGNOSIS — F061 Catatonic disorder due to known physiological condition: Secondary | ICD-10-CM

## 2024-06-21 MED ORDER — LOSARTAN POTASSIUM 100 MG PO TABS
100.0000 mg | ORAL_TABLET | Freq: Every day | ORAL | 1 refills | Status: AC
  Filled 2024-06-21 – 2024-07-15 (×3): qty 90, 90d supply, fill #0
  Filled 2024-10-03: qty 90, 90d supply, fill #1

## 2024-06-21 MED ORDER — VITAMIN B-1 100 MG PO TABS
100.0000 mg | ORAL_TABLET | Freq: Every day | ORAL | 3 refills | Status: AC
  Filled 2024-06-21 – 2024-07-15 (×3): qty 100, 100d supply, fill #0
  Filled 2024-10-24: qty 100, 100d supply, fill #1

## 2024-06-21 MED ORDER — VORTIOXETINE HBR 20 MG PO TABS
20.0000 mg | ORAL_TABLET | Freq: Every day | ORAL | 0 refills | Status: DC
  Filled 2024-06-21: qty 90, 90d supply, fill #0

## 2024-06-21 MED ORDER — HYDROCHLOROTHIAZIDE 12.5 MG PO TABS
12.5000 mg | ORAL_TABLET | Freq: Every day | ORAL | 1 refills | Status: AC
  Filled 2024-06-21 – 2024-07-15 (×3): qty 90, 90d supply, fill #0
  Filled 2024-10-03: qty 90, 90d supply, fill #1

## 2024-06-22 ENCOUNTER — Other Ambulatory Visit (HOSPITAL_BASED_OUTPATIENT_CLINIC_OR_DEPARTMENT_OTHER): Payer: Self-pay

## 2024-06-25 ENCOUNTER — Other Ambulatory Visit (HOSPITAL_BASED_OUTPATIENT_CLINIC_OR_DEPARTMENT_OTHER): Payer: Self-pay

## 2024-06-25 MED ORDER — NEOMYCIN-POLYMYXIN-DEXAMETH 0.1 % OP OINT
TOPICAL_OINTMENT | OPHTHALMIC | 1 refills | Status: AC
  Filled 2024-06-25 – 2024-07-15 (×2): qty 3.5, 7d supply, fill #0

## 2024-06-30 ENCOUNTER — Other Ambulatory Visit (HOSPITAL_BASED_OUTPATIENT_CLINIC_OR_DEPARTMENT_OTHER): Payer: Self-pay

## 2024-07-02 ENCOUNTER — Other Ambulatory Visit: Payer: Self-pay

## 2024-07-07 ENCOUNTER — Other Ambulatory Visit (HOSPITAL_BASED_OUTPATIENT_CLINIC_OR_DEPARTMENT_OTHER): Payer: Self-pay

## 2024-07-12 ENCOUNTER — Other Ambulatory Visit (HOSPITAL_BASED_OUTPATIENT_CLINIC_OR_DEPARTMENT_OTHER): Payer: Self-pay

## 2024-07-13 ENCOUNTER — Other Ambulatory Visit (HOSPITAL_BASED_OUTPATIENT_CLINIC_OR_DEPARTMENT_OTHER): Payer: Self-pay

## 2024-07-15 ENCOUNTER — Other Ambulatory Visit (HOSPITAL_BASED_OUTPATIENT_CLINIC_OR_DEPARTMENT_OTHER): Payer: Self-pay

## 2024-07-15 ENCOUNTER — Encounter: Payer: Self-pay | Admitting: Neurology

## 2024-07-15 ENCOUNTER — Other Ambulatory Visit: Payer: Self-pay | Admitting: Psychiatry

## 2024-07-15 DIAGNOSIS — F3132 Bipolar disorder, current episode depressed, moderate: Secondary | ICD-10-CM

## 2024-07-15 MED FILL — Levothyroxine Sodium Tab 75 MCG: ORAL | 90 days supply | Qty: 90 | Fill #1 | Status: AC

## 2024-07-16 ENCOUNTER — Other Ambulatory Visit: Payer: Self-pay

## 2024-07-16 ENCOUNTER — Other Ambulatory Visit (HOSPITAL_BASED_OUTPATIENT_CLINIC_OR_DEPARTMENT_OTHER): Payer: Self-pay

## 2024-07-16 MED ORDER — PRIMIDONE 50 MG PO TABS
ORAL_TABLET | ORAL | 0 refills | Status: DC
  Filled 2024-07-16: qty 270, 90d supply, fill #0

## 2024-07-20 ENCOUNTER — Other Ambulatory Visit (HOSPITAL_BASED_OUTPATIENT_CLINIC_OR_DEPARTMENT_OTHER): Payer: Self-pay

## 2024-07-20 MED ORDER — AIMOVIG 140 MG/ML ~~LOC~~ SOAJ
1.0000 | SUBCUTANEOUS | 11 refills | Status: DC
  Filled 2024-07-20 – 2024-07-25 (×3): qty 1, 30d supply, fill #0

## 2024-07-21 ENCOUNTER — Other Ambulatory Visit (HOSPITAL_BASED_OUTPATIENT_CLINIC_OR_DEPARTMENT_OTHER): Payer: Self-pay

## 2024-07-23 ENCOUNTER — Other Ambulatory Visit (HOSPITAL_COMMUNITY): Payer: Self-pay

## 2024-07-23 ENCOUNTER — Other Ambulatory Visit (HOSPITAL_BASED_OUTPATIENT_CLINIC_OR_DEPARTMENT_OTHER): Payer: Self-pay

## 2024-07-23 ENCOUNTER — Telehealth: Payer: Self-pay | Admitting: Neurology

## 2024-07-23 ENCOUNTER — Telehealth: Payer: Self-pay | Admitting: Pharmacy Technician

## 2024-07-23 NOTE — Telephone Encounter (Signed)
 Pt's friend named [name] lefted a message and she stated that the new prescription called Aimovig needs a prior authorization. [name] stated that she will be leaving this Friday for 3 weeks. Please reach out to her. Thanks

## 2024-07-23 NOTE — Telephone Encounter (Signed)
 PA has been submitted, and telephone encounter has been created. Please see telephone encounter dated 10.20.25.

## 2024-07-23 NOTE — Telephone Encounter (Signed)
 Pharmacy Patient Advocate Encounter   Received notification from Pt Calls Messages that prior authorization for AIMOVIG 140MG  is required/requested.   Insurance verification completed.   The patient is insured through [facility].   Per test claim: PA required; PA submitted to above mentioned insurance via Latent Key/confirmation #/EOC [id number] Status is pending

## 2024-07-24 ENCOUNTER — Other Ambulatory Visit (HOSPITAL_BASED_OUTPATIENT_CLINIC_OR_DEPARTMENT_OTHER): Payer: Self-pay

## 2024-07-24 ENCOUNTER — Telehealth: Payer: Self-pay | Admitting: Neurology

## 2024-07-24 NOTE — Telephone Encounter (Signed)
 Pt.s daughter called to explain Emgality is in Formulary with [employer] but needs a PA, please contact if you have any questions to get Pa approved

## 2024-07-24 NOTE — Telephone Encounter (Signed)
 Who's calling (name and relationship to patient) : Cristal, with [employer] medicare  Best contact number:  Provider they see: Dr. Thurston  Reason for call: Cristal  called in regarding a denial letter.   FYI: She hung up before collecting more information.

## 2024-07-24 NOTE — Telephone Encounter (Signed)
 Pharmacy Patient Advocate Encounter  Received notification from [facility] that Prior Authorization for AIMOVIG 140MG  has been DENIED.  Full denial letter will be uploaded to the media tab. See denial reason below.    PA #/Case ID/Reference #: [id number]

## 2024-07-25 ENCOUNTER — Other Ambulatory Visit (HOSPITAL_BASED_OUTPATIENT_CLINIC_OR_DEPARTMENT_OTHER): Payer: Self-pay

## 2024-07-25 ENCOUNTER — Encounter: Payer: Self-pay | Admitting: Neurology

## 2024-07-25 DIAGNOSIS — F3113 Bipolar disorder, current episode manic without psychotic features, severe: Secondary | ICD-10-CM | POA: Diagnosis not present

## 2024-07-25 MED ORDER — EMGALITY 120 MG/ML ~~LOC~~ SOAJ
1.0000 | SUBCUTANEOUS | 11 refills | Status: DC
  Filled 2024-07-25: qty 1, 30d supply, fill #0
  Filled 2024-08-22: qty 2, fill #0

## 2024-07-25 NOTE — Telephone Encounter (Signed)
 Hi Monchell, her caregiver contacted our office that Emgality is on her formulary. I wrote a letter (it is under Letters tab) requesting for consideration for Emgality with her migrainous features. You can use the letter if needed to do the PA for Emgality. Thank you so much for your help.

## 2024-07-25 NOTE — Addendum Note (Signed)
 Addended by: GEORJEAN DARICE HERO on: 07/25/2024 12:20 PM   Modules accepted: Orders

## 2024-07-25 NOTE — Telephone Encounter (Signed)
 See other phone note

## 2024-07-26 ENCOUNTER — Other Ambulatory Visit (HOSPITAL_BASED_OUTPATIENT_CLINIC_OR_DEPARTMENT_OTHER): Payer: Self-pay

## 2024-07-30 ENCOUNTER — Other Ambulatory Visit (HOSPITAL_COMMUNITY): Payer: Self-pay

## 2024-07-30 ENCOUNTER — Telehealth: Payer: Self-pay | Admitting: Pharmacy Technician

## 2024-07-30 ENCOUNTER — Other Ambulatory Visit (HOSPITAL_BASED_OUTPATIENT_CLINIC_OR_DEPARTMENT_OTHER): Payer: Self-pay

## 2024-07-30 NOTE — Telephone Encounter (Signed)
 PA has been submitted, and telephone encounter has been created. Please see telephone encounter dated 10.27.25. I submitted the medical letter of necessity with the PA request. Thank you for providing that. I will provide an update on the same 10.27.25 encounter.

## 2024-07-30 NOTE — Telephone Encounter (Signed)
 Pharmacy Patient Advocate Encounter   Received notification from Pt Calls Messages that prior authorization for [facility] 120MG  is required/requested.   Insurance verification completed.   The patient is insured through [facility].   Per test claim: PA required; PA submitted to above mentioned insurance via Latent Key/confirmation #/EOC [id number] Status is pending

## 2024-08-06 ENCOUNTER — Other Ambulatory Visit (HOSPITAL_BASED_OUTPATIENT_CLINIC_OR_DEPARTMENT_OTHER): Payer: Self-pay

## 2024-08-08 ENCOUNTER — Telehealth: Payer: Self-pay | Admitting: Neurology

## 2024-08-08 NOTE — Telephone Encounter (Signed)
 Husband called no answer left a voice mail to call the office back

## 2024-08-08 NOTE — Telephone Encounter (Signed)
 Pt husband Dr. Cloretta called an informed  that we are still waiting for word from her insurance if the appeal we submitted for the headache medication monthly injection is approved or not. We will follow-up with the prior authorization team. A lot of the headache medications are contraindicated due to her other medical issues, this is the reason we are trying to get the injection approved.   Pt husband is asking if she can be started on qulipta?

## 2024-08-08 NOTE — Telephone Encounter (Signed)
 Pt's husband lefted a message stating that he wants a return call back. Pt stated that his wife is having real bad headaches everyday  and he wants to see can something be prescribe for the headaches. Thanks

## 2024-08-08 NOTE — Telephone Encounter (Signed)
 Can you pls call Dr. Cloretta and let him know that we are still waiting for word from her insurance if the appeal we submitted for the headache medication monthly injection is approved or not. We will follow-up with the prior authorization team. A lot of the headache medications are contraindicated due to her other medical issues, this is the reason we are trying to get the injection approved.

## 2024-08-09 NOTE — Telephone Encounter (Signed)
 Message sent to PA team.

## 2024-08-09 NOTE — Telephone Encounter (Signed)
 Pharmacy Patient Advocate Encounter  Received notification from [facility] that Prior Authorization for [facility] 120MG  has been DENIED.  Full denial letter will be uploaded to the media tab. See denial reason below.    PA #/Case ID/Reference #: [id number]

## 2024-08-09 NOTE — Telephone Encounter (Signed)
 Any update on this PA?

## 2024-08-09 NOTE — Telephone Encounter (Signed)
 Can you pls check on the Emgality appeal first before we send another prescription for a different medication again? Thanks

## 2024-08-10 NOTE — Telephone Encounter (Signed)
 Spoke with Dr Cloretta his wife is schedule for 11/13 at 10am to talk with Dr Georjean about her headaches he may call back to cancel the appointment if the head nurse can not bring her,

## 2024-08-10 NOTE — Telephone Encounter (Signed)
 Pls let Dr. Cloretta know that Meghan Oliver was not approved. Most likely Meghan Oliver will not be approved as well, I recommend we do a clinic visit to discuss her headaches more. Can they either do a virtual visit or come in on 11/13 at 10am? The other patient could not make that day so it is open. Thanks

## 2024-08-13 ENCOUNTER — Other Ambulatory Visit: Payer: Self-pay | Admitting: Psychiatry

## 2024-08-13 ENCOUNTER — Other Ambulatory Visit: Payer: Self-pay

## 2024-08-13 ENCOUNTER — Other Ambulatory Visit (HOSPITAL_BASED_OUTPATIENT_CLINIC_OR_DEPARTMENT_OTHER): Payer: Self-pay

## 2024-08-13 DIAGNOSIS — F061 Catatonic disorder due to known physiological condition: Secondary | ICD-10-CM

## 2024-08-13 MED ORDER — LURASIDONE HCL 60 MG PO TABS
60.0000 mg | ORAL_TABLET | Freq: Every day | ORAL | 0 refills | Status: DC
  Filled 2024-08-13: qty 90, 90d supply, fill #0

## 2024-08-15 ENCOUNTER — Other Ambulatory Visit (HOSPITAL_BASED_OUTPATIENT_CLINIC_OR_DEPARTMENT_OTHER): Payer: Self-pay

## 2024-08-16 ENCOUNTER — Other Ambulatory Visit (HOSPITAL_BASED_OUTPATIENT_CLINIC_OR_DEPARTMENT_OTHER): Payer: Self-pay

## 2024-08-16 ENCOUNTER — Ambulatory Visit (INDEPENDENT_AMBULATORY_CARE_PROVIDER_SITE_OTHER): Admitting: Neurology

## 2024-08-16 ENCOUNTER — Encounter: Payer: Self-pay | Admitting: Neurology

## 2024-08-16 ENCOUNTER — Telehealth: Payer: Self-pay

## 2024-08-16 VITALS — BP 120/65 | HR 65 | Ht 60.0 in | Wt 97.4 lb

## 2024-08-16 DIAGNOSIS — G43001 Migraine without aura, not intractable, with status migrainosus: Secondary | ICD-10-CM

## 2024-08-16 MED ORDER — QULIPTA 60 MG PO TABS
60.0000 mg | ORAL_TABLET | Freq: Every day | ORAL | 5 refills | Status: AC
  Filled 2024-08-16 – 2024-09-02 (×3): qty 30, 30d supply, fill #0

## 2024-08-16 NOTE — Telephone Encounter (Signed)
 Patient is past due for annual PPM follow up in office with Dr. Lawyer. Also, patient has not transmitted from remote monitor in over 2 years.  Please add to appt. Notes to discuss remote monitoring options moving forward.

## 2024-08-16 NOTE — Progress Notes (Signed)
 Medication Samples have been provided to the patient.  Drug name: qulipta       Strength: 60mg         Qty: 5  LOT: [id number]  Exp.Date: 06/2026  Dosing instructions: take as directed   The patient has been instructed regarding the correct time, dose, and frequency of taking this medication, including desired effects and most common side effects.

## 2024-08-16 NOTE — Patient Instructions (Signed)
 Good to see you.  Start samples of Qulipta 60mg  daily as we work on getting prior authorization  2. Minimize rescue medication to 3 a week to avoid rebound headaches  3. Follow-up as scheduled next month, call for any changes.

## 2024-08-16 NOTE — Progress Notes (Signed)
 NEUROLOGY FOLLOW UP OFFICE NOTE  Meghan Oliver [id number] June 05, 1941  HISTORY OF PRESENT ILLNESS: I had the pleasure of seeing Meghan Oliver in follow-up in the neurology clinic on 08/16/2024.  The patient was last seen 5 months ago. She presents for an earlier visit due to an increase in headaches. She is accompanied by her head nurse Meghan Oliver who helps supplement the history today.  Records and images were personally reviewed where available.  She was started on Nurtec every other day in May with good response from asking for Naproxen daily to only when anxious or stressed out. However, in the past month, she started having daily headaches again despite Nurtec. She reports throbbing pain in the frontal region, keeping her awake at night sometimes. She is very sensitive to lights. No nausea/vomiting. Meghan Oliver reports she has at least 5 headache days a week, they have been alternating Tylenol  or Naproxen but plan to wean down. They are using redirection or exercise, or increasing her hydration. She denies any focal weakness, her whole body is weak. Last ECT was 07/25/24.   Prior preventative medications: amitriptyline, Gabapentin , Lamotrigine, Toprol , Venlafaxine.   History on Initial Assessment 12/07/2023: Meghan Oliver is an 83 year old right-handed woman with multiple medical issues, seen by our Memory Disorders PA Meghan Oliver 2 months ago for worsening cognitive changes. They present today to discuss her 24-hour EEG results. Records and images available were reviewed. She had Neuropsychological testing in January 2019 with a diagnosis of moderate dementia of unspecified etiology. On initial visit with Meghan Oliver in 09/2023, her MoCA score was 12/30. As part of her evaluation, she had a brain MRI without contrast 11/2023 which I personally reviewed, no acute changes, there was mild diffuse atrophy, multifocal FLAIR signal changes in the cerebral white matter, pons, left thalamus, consistent with mild  chronic microvascular disease. Her 1-hour EEG in 10/2023 showed occasional right temporal focal slowing and occasional right frontotemporal epileptiform discharges. She had a 24-hour ambulatory EEG in February which showed occasional bifrontal spikes, maximal over the right frontopolar region, independent sharp waves over the bilateral temporal regions with triphasic morphology. No electrographic seizures seen.   She is accompanied by her husband Meghan Oliver and her nurse Meghan Oliver, who has worked with her for the past 20 years. She reports that memory has progressively worsened over several years, she was started on Memantine  over 10 years ago. Initially, they noticed more difficulties with long-term memory. She has been treated with ECT for several years and when memory worsened, ECT treatments were stopped for 3 years with no change in memory. Her short-term memory is now much worse than before, she would forget conversations from a few minutes ago. She is having a good day today, Meghan Oliver reports that she usually has a very, very flat affect, but today she is able to answer and ask questions, smiling. Usually it is just silence. Meghan Oliver states this is as bright and engaging as she has been. She is in great shape today, 80% as good as we've gotten in the past year.   On review of records, she has a history of catatonia. Meghan Oliver reports that 10 years ago, she had an episode where she was sitting on the bedroom floor, naked, upset, speaking gibberish and not responding to them. This lasted for days, she was admitted at [facility] for 36 hours with no improvement, they brought her to [facility] where she had ECT. Meghan Oliver notes that ECT with medication management has been most effective  for her. In January 2024, she started having worsening depression and anxiety where she was barely eating, withdrawn, with delusions. Meghan Oliver shows a video of how she was doing in 12/2022 before being brought to [facility] for ECT. She is lying in bed,  restless, tremulous with eyes closed, saying she is afraid. Meghan Oliver reports hallucinations, agitation, very little engagement. Several medications, including Ketamine were tried. She was having minimal sleep with significant anxiety. She was admitted to [facility] 12/10/22 and underwent ECT. She was initially getting ECT 2-3 times a week with 50% improvement, then now gets ECT every 2 weeks, last session was 10/29/23. A few days before her next ECT treatment, she would have less engagement going 4-5 hours with no speech, answering 2-3 words but appropriate. She would be less engaged for around 9 days after ECT treatment then they start seeing improvement. Sleep is usually okay. She has tried multiple psychotropic medications in the past, she is now off Lithium . Meghan Oliver recalls she has tried Lamictal and Neurontin  in the past.   She states she does not have headaches frequently, but Dr. Cloretta and Meghan Oliver report she complains of headaches most mornings. She would have a headache after ECT, and Meghan Oliver has noticed they are often anxiety-induced as well. She reports pressure over the frontal region with no nausea/vomiting. She has always been hypersensitive to light. She asks for Tylenol , usually on a daily basis, they give her one a day. She denies any dizziness, vision changes, neck/back pain, focal numbness/tingling/weakness, urinary incontinence. She has chronic constipation and takes Linzess  for gastroparesis. She has tremors that are worse depending on her depression. She is on Primidone  which seems to help. She denies any olfactory/gustatory hallucinations, rising epigastric sensation, myoclonic jerks. She takes lorazepam  0.25mg  as needed, around twice a month when talking about something that is very stressful or anxiety-provoking. She had a normal birth and early development.  There is no history of febrile convulsions, CNS infections such as meningitis/encephalitis, significant traumatic brain injury, neurosurgical procedures, or  family history of seizures.   PAST MEDICAL HISTORY: Past Medical History:  Diagnosis Date   Anxiety    Arthritis    hips, spine (03/17/2018)   Bipolar II disorder (HCC)    CHF (congestive heart failure) (HCC)    Chronic bronchitis (HCC)    Chronic lower back pain    Chronic right hip pain    CKD (chronic kidney disease), stage II    Coronary artery disease    stent x1   Esophagitis, erosive    GAD (generalized anxiety disorder)    GERD (gastroesophageal reflux disease)    Headache    maybe monthly (03/17/2018))   Heart murmur, systolic    History of adenomatous polyp of colon    08-04-2016  tubular adenoma   History of blood transfusion 12/2017   related to vascular hematoma   History of electroconvulsive therapy    at [facility]--  started 04-15-2015 to 11-17-2016  total greater than 40 times   History of hiatal hernia    Hyperlipidemia    Hypertension    Hypothyroidism    Internal carotid artery stenosis, bilateral    per last duplex 05-01-2014  bilateral ICA 40-59%   Major depression, chronic    ECT treatments extensive and multiple started 07/ 2016   Memory loss    both short and long-term; needs frequent reminders to follow instrucitons (05/16/2017)   Migraines    none in years (03/17/2018)   OSA (obstructive sleep apnea)  per study 06/ 2012 moderate OSA  ; refuses to wear masks (03/17/2018)   Osteoporosis    Pneumonia 07/29/2022   Poor historian    due to short term memory loss   Presence of permanent cardiac pacemaker 03/17/2018   Pulmonary nodule    monitored by pcp   S/P placement of cardiac pacemaker 03/17/18 [facility]  03/18/2018   Short-term memory loss    Sick sinus syndrome (HCC)     MEDICATIONS: Current Outpatient Medications on File Prior to Visit  Medication Sig Dispense Refill   amLODipine  (NORVASC ) 10 MG tablet Take 1 tablet (10 mg total) by mouth daily. 90 tablet 1   aspirin  81 MG chewable tablet Chew 1 tablet (81 mg total) by mouth  daily.     Calcium  Carbonate-Vitamin D  (CALCIUM  600/VITAMIN D  PO) Take 1 tablet by mouth 2 (two) times daily.     cetirizine (ZYRTEC) 10 MG tablet Take 10 mg by mouth as needed for allergies.     Cholecalciferol  (VITAMIN D3) 50 MCG (2000 UT) TABS Take 2,000 Units by mouth in the morning.     clonazePAM  (KLONOPIN ) 0.5 MG tablet Take 1 tablet (0.5 mg total) by mouth 2 (two) times daily. 180 tablet 0   cloNIDine  (CATAPRES ) 0.1 MG tablet Take 1 tablet (0.1 mg total) by mouth as needed prior to ECT treatment 20 tablet 2   dexlansoprazole  (DEXILANT ) 60 MG capsule Take 1 capsule (60 mg total) by mouth daily. 90 capsule 3   Galcanezumab-gnlm (EMGALITY) 120 MG/ML SOAJ Inject 1 Dose into the skin every 30 (thirty) days. 1.12 mL 11   glycerin  adult 2 g suppository Use 1 suppository as needed if you have not had a bowel movement in  4 to 5 days 25 suppository 0   hydrochlorothiazide  (HYDRODIURIL ) 12.5 MG tablet Take 1 tablet (12.5 mg total) by mouth daily. 90 tablet 1   Krill Oil 500 MG CAPS Take 500 mg by mouth daily.     lactulose  (CHRONULAC ) 10 GM/15ML solution Take 30 mLs (20 g total) by mouth 3 (three) times daily between meals as needed for mild constipation. 473 mL 3   Lavender Oil 80 MG CAPS Take 160 mg by mouth at bedtime.     levothyroxine  (SYNTHROID ) 75 MCG tablet Take 1 tablet (75 mcg total) by mouth daily. 90 tablet 2   levothyroxine  (SYNTHROID ) 75 MCG tablet Take 1 tablet (75 mcg total) by mouth daily. 90 tablet 2   linaclotide  (LINZESS ) 72 MCG capsule Take 2 capsules (145 mcg total) by mouth daily before breakfast. 180 capsule 1   LORazepam  (ATIVAN ) 0.5 MG tablet TAKE 2 TABLETS (1 MG)  BY MOUTH EVERY NIGHT AT BEDTIME AND 1 TABLET EVERY 4 HOURS AS NEEDED FOR ANXIETY 270 tablet 0   losartan  (COZAAR ) 100 MG tablet Take 1 tablet (100 mg total) by mouth daily. 90 tablet 1   Lurasidone  HCl 60 MG TABS Take 1 tablet (60 mg total) by mouth daily with breakfast. 90 tablet 0   magic mouthwash w/lidocaine   SOLN Take 5 mLs by mouth 3 (three) times daily as needed for mouth pain. 120 mL 0   memantine  (NAMENDA ) 10 MG tablet Take 1 tablet (10 mg total) by mouth 2 (two) times daily. 180 tablet 1   metoprolol  tartrate (LOPRESSOR ) 25 MG tablet Take 1 tablet (25 mg total) by mouth as needed. 45 tablet 0   mirtazapine  (REMERON ) 7.5 MG tablet Take 1 tablet (7.5 mg total) by mouth at bedtime. 90 tablet  0   modafinil  (PROVIGIL ) 100 MG tablet Take 1 tablet (100 mg total) in the morning 90 tablet 0   Multiple Vitamins-Minerals (MULTIVITAMIN WITH MINERALS) tablet Take 1 tablet by mouth daily.     Multiple Vitamins-Minerals (PRESERVISION AREDS 2 PO) Take 1 capsule by mouth in the morning and at bedtime.     neomycin -polymyxin b-dexamethasone  (MAXITROL ) 3.5-10000-0.1 OINT Apply thin line (1 cm) of ointment to right eye twice daily for 1 week. (Patient taking differently: as needed.) 3.5 g 1   neomycin -polymyxin-dexameth (MAXITROL ) 0.1 % OINT Apply a small amount into the affected eye twice a day for 1 week. 3.5 g 1   ofloxacin  (OCUFLOX ) 0.3 % ophthalmic solution Instill 1 drop into affected eye three times daily for 5 days. 5 mL 3   omeprazole -sodium bicarbonate  (ZEGERID ) 40-1100 MG capsule Take 1 capsule by mouth daily before breakfast. (Patient taking differently: Take 1 capsule by mouth as needed.) 90 capsule 3   ondansetron  (ZOFRAN -ODT) 4 MG disintegrating tablet Place 1 tablet every 6 hours by translingual route as needed. 20 tablet 0   polyethylene glycol (MIRALAX  / GLYCOLAX ) 17 g packet Take 17 g by mouth as needed for mild constipation.     primidone  (MYSOLINE ) 50 MG tablet Take 2 tablets (100 mg total) by mouth every morning AND 1 tablet (50 mg total) at bedtime. 270 tablet 0   Rimegepant Sulfate  (NURTEC) 75 MG TBDP Take 1 tablet (75 mg total) by mouth every other day for headache prevention 48 tablet 3   sennosides-docusate sodium  (SENOKOT-S) 8.6-50 MG tablet Take 2 tablets by mouth as needed.     thiamine   (VITAMIN B-1) 100 MG tablet Take 1 tablet (100 mg total) by mouth daily. 100 tablet 3   valACYclovir  (VALTREX ) 1000 MG tablet Take 2 tablets (2,000 mg total) by mouth at onset, then take 2 tablets 12 hours later. 30 tablet 1   vitamin B-12 (CYANOCOBALAMIN ) 1000 MCG tablet Take 1,000 mcg by mouth at bedtime.     vortioxetine  HBr (TRINTELLIX ) 20 MG TABS tablet Take 1 tablet (20 mg total) by mouth daily. 90 tablet 0   No current facility-administered medications on file prior to visit.    ALLERGIES: Allergies  Allergen Reactions   Propranolol Other (See Comments)    Low blood pressure. Bradycardia    Propranolol Hcl Other (See Comments)   Brexpiprazole Other (See Comments)    Aphasia and catatonia    FAMILY HISTORY: Family History  Problem Relation Age of Onset   Heart attack Father 57       deceased   Hypertension Father    Heart disease Father    Dementia Brother    Parkinson's disease Brother    Heart disease Brother    Breast cancer Paternal Aunt        Age 26's   Breast cancer Paternal Grandmother        Age unknown   Colon cancer Neg Hx     SOCIAL HISTORY: Social History   Socioeconomic History   Marital status: Married    Spouse name: Dr. Sheppard Oliver   Number of children: 2   Years of education: 15   Highest education level: Not on file  Occupational History   Occupation: housewife    Employer: UNEMPLOYED  Tobacco Use   Smoking status: Former    Current packs/day: 0.00    Average packs/day: 2.0 packs/day for 15.0 years (30.0 ttl pk-yrs)    Types: Cigarettes    Start date: 01/13/1956  Quit date: 01/13/1971    Years since quitting: 53.6    Passive exposure: Never   Smokeless tobacco: Never  Vaping Use   Vaping status: Never Used  Substance and Sexual Activity   Alcohol use: Yes    Comment: occassional 1 x a week   Drug use: Never   Sexual activity: Not Currently    Comment: intercourse age 39, sexual partners less than 5  Other Topics Concern   Not  on file  Social History Narrative   Right handed   Drinks caffeine  no   Single home at [employer]   Social Drivers of Health   Financial Resource Strain: Low Risk  (12/12/2022)   Received from [facility]   Overall Financial Resource Strain (CARDIA)    Difficulty of Paying Living Expenses: Not hard at all  Food Insecurity: No Food Insecurity (12/12/2022)   Received from [facility]   Hunger Vital Sign    Within the past 12 months, you worried that your food would run out before you got the money to buy more.: Never true    Within the past 12 months, the food you bought just didn't last and you didn't have money to get more.: Never true  Transportation Needs: No Transportation Needs (12/12/2022)   Received from [facility] - Transportation    In the past 12 months, has lack of transportation kept you from medical appointments or from getting medications?: No    Lack of Transportation (Non-Medical): No  Physical Activity: Not on file  Stress: Not on file  Social Connections: Not on file  Intimate Partner Violence: Not At Risk (07/06/2022)   Humiliation, Afraid, Rape, and Kick questionnaire    Fear of Current or Ex-Partner: No    Emotionally Abused: No    Physically Abused: No    Sexually Abused: No     PHYSICAL EXAM: Vitals:   08/16/24 1015  BP: 120/65  Pulse: 65  SpO2: 96%   General: No acute distress, sitting on wheelchair Head:  Normocephalic/atraumatic Skin/Extremities: No rash, no edema Neurological Exam: alert and awake. She is able to answer questions but with a slight delay, answers in whispered responses. She thinks it is still October, year is 2525. Able to spell WORLD, says no way when asked to spell it backwards. Able to name. Able to follow instructions. Cranial nerves: Pupils equal, round. Extraocular movements intact with no nystagmus. Visual fields full.  No facial asymmetry.  Motor: Bulk and  tone normal, muscle strength 5/5 throughout with no pronator drift.   Finger to nose testing intact.  Gait not tested. NO resting tremor, +mild bilateral postural and endpoint tremor.    IMPRESSION: Meghan Oliver is an 83 yo RH woman with multiple medical issues seen for worsening cognitive issues and abnormal EEG with spikes in the bifrontal regions (maximal at right frontopolar), bilateral temporal sharp waves with triphasic morphology. She has not had any clinical signs of a seizure. She continues with ECT. She presents today for worsening headaches, symptoms reviewed, symptoms fulfill migraine criteria. In addition, she had a positive initial clinical response to Nurtec, indicating a CGRP-mediated pathophysiology. She has tried several preventative medications in the past, she cannot take seizure medications such as Topiramate  and Depakote due to continued need for ECT treatments for severe depression. She was given samples for Qulipta to take daily, side effects discussed. We will work on prior authorization for [employer]. Her caregivers know to minimize rescue  medications to 3 a week to avoid rebound headaches. Follow-up as scheduled next month, call for any changes.    Thank you for allowing me to participate in her care.  Please do not hesitate to call for any questions or concerns.    Darice Shivers, M.D.   CC: Dr. Perri

## 2024-08-17 NOTE — Telephone Encounter (Signed)
 Spoke w/ [name] (POA/RN) - patient is scheduled to see Jodie Passey, PA on 12/30.

## 2024-08-19 ENCOUNTER — Other Ambulatory Visit: Payer: Self-pay | Admitting: Psychiatry

## 2024-08-19 DIAGNOSIS — F061 Catatonic disorder due to known physiological condition: Secondary | ICD-10-CM

## 2024-08-19 DIAGNOSIS — G4733 Obstructive sleep apnea (adult) (pediatric): Secondary | ICD-10-CM

## 2024-08-20 ENCOUNTER — Other Ambulatory Visit (HOSPITAL_BASED_OUTPATIENT_CLINIC_OR_DEPARTMENT_OTHER): Payer: Self-pay

## 2024-08-20 ENCOUNTER — Telehealth: Payer: Self-pay | Admitting: Internal Medicine

## 2024-08-20 MED ORDER — MODAFINIL 100 MG PO TABS
ORAL_TABLET | ORAL | 0 refills | Status: DC
  Filled 2024-08-20: qty 90, 90d supply, fill #0

## 2024-08-20 NOTE — Telephone Encounter (Signed)
 Please call their nurse, [name] to RS with me

## 2024-08-21 ENCOUNTER — Other Ambulatory Visit (HOSPITAL_BASED_OUTPATIENT_CLINIC_OR_DEPARTMENT_OTHER): Payer: Self-pay

## 2024-08-21 DIAGNOSIS — I110 Hypertensive heart disease with heart failure: Secondary | ICD-10-CM | POA: Diagnosis not present

## 2024-08-21 DIAGNOSIS — F202 Catatonic schizophrenia: Secondary | ICD-10-CM | POA: Diagnosis not present

## 2024-08-21 DIAGNOSIS — R9431 Abnormal electrocardiogram [ECG] [EKG]: Secondary | ICD-10-CM | POA: Diagnosis not present

## 2024-08-21 DIAGNOSIS — F419 Anxiety disorder, unspecified: Secondary | ICD-10-CM | POA: Diagnosis not present

## 2024-08-21 DIAGNOSIS — J449 Chronic obstructive pulmonary disease, unspecified: Secondary | ICD-10-CM | POA: Diagnosis not present

## 2024-08-21 DIAGNOSIS — E039 Hypothyroidism, unspecified: Secondary | ICD-10-CM | POA: Diagnosis not present

## 2024-08-21 DIAGNOSIS — Z01818 Encounter for other preprocedural examination: Secondary | ICD-10-CM | POA: Diagnosis not present

## 2024-08-21 DIAGNOSIS — Z79890 Hormone replacement therapy (postmenopausal): Secondary | ICD-10-CM | POA: Diagnosis not present

## 2024-08-21 DIAGNOSIS — F061 Catatonic disorder due to known physiological condition: Secondary | ICD-10-CM | POA: Diagnosis not present

## 2024-08-21 DIAGNOSIS — I495 Sick sinus syndrome: Secondary | ICD-10-CM | POA: Diagnosis not present

## 2024-08-21 DIAGNOSIS — K219 Gastro-esophageal reflux disease without esophagitis: Secondary | ICD-10-CM | POA: Diagnosis not present

## 2024-08-21 DIAGNOSIS — F332 Major depressive disorder, recurrent severe without psychotic features: Secondary | ICD-10-CM | POA: Diagnosis not present

## 2024-08-21 DIAGNOSIS — G4730 Sleep apnea, unspecified: Secondary | ICD-10-CM | POA: Diagnosis not present

## 2024-08-21 DIAGNOSIS — N189 Chronic kidney disease, unspecified: Secondary | ICD-10-CM | POA: Diagnosis not present

## 2024-08-21 DIAGNOSIS — I5032 Chronic diastolic (congestive) heart failure: Secondary | ICD-10-CM | POA: Diagnosis not present

## 2024-08-21 DIAGNOSIS — Z87891 Personal history of nicotine dependence: Secondary | ICD-10-CM | POA: Diagnosis not present

## 2024-08-21 DIAGNOSIS — F3113 Bipolar disorder, current episode manic without psychotic features, severe: Secondary | ICD-10-CM | POA: Diagnosis not present

## 2024-08-21 DIAGNOSIS — Z79899 Other long term (current) drug therapy: Secondary | ICD-10-CM | POA: Diagnosis not present

## 2024-08-21 DIAGNOSIS — R251 Tremor, unspecified: Secondary | ICD-10-CM | POA: Diagnosis not present

## 2024-08-21 DIAGNOSIS — I10 Essential (primary) hypertension: Secondary | ICD-10-CM | POA: Diagnosis not present

## 2024-08-21 DIAGNOSIS — I2510 Atherosclerotic heart disease of native coronary artery without angina pectoris: Secondary | ICD-10-CM | POA: Diagnosis not present

## 2024-08-21 DIAGNOSIS — E7849 Other hyperlipidemia: Secondary | ICD-10-CM | POA: Diagnosis not present

## 2024-08-21 DIAGNOSIS — Z950 Presence of cardiac pacemaker: Secondary | ICD-10-CM | POA: Diagnosis not present

## 2024-08-21 DIAGNOSIS — G4733 Obstructive sleep apnea (adult) (pediatric): Secondary | ICD-10-CM | POA: Diagnosis not present

## 2024-08-21 DIAGNOSIS — F0390 Unspecified dementia without behavioral disturbance: Secondary | ICD-10-CM | POA: Diagnosis not present

## 2024-08-21 DIAGNOSIS — F319 Bipolar disorder, unspecified: Secondary | ICD-10-CM | POA: Diagnosis not present

## 2024-08-21 DIAGNOSIS — I272 Pulmonary hypertension, unspecified: Secondary | ICD-10-CM | POA: Diagnosis not present

## 2024-08-22 ENCOUNTER — Other Ambulatory Visit: Payer: Self-pay | Admitting: Neurology

## 2024-08-22 ENCOUNTER — Other Ambulatory Visit (HOSPITAL_BASED_OUTPATIENT_CLINIC_OR_DEPARTMENT_OTHER): Payer: Self-pay

## 2024-08-23 ENCOUNTER — Other Ambulatory Visit (HOSPITAL_BASED_OUTPATIENT_CLINIC_OR_DEPARTMENT_OTHER): Payer: Self-pay

## 2024-08-30 ENCOUNTER — Encounter: Payer: Self-pay | Admitting: Neurology

## 2024-08-31 ENCOUNTER — Other Ambulatory Visit (HOSPITAL_BASED_OUTPATIENT_CLINIC_OR_DEPARTMENT_OTHER): Payer: Self-pay

## 2024-09-02 ENCOUNTER — Other Ambulatory Visit: Payer: Self-pay | Admitting: Internal Medicine

## 2024-09-02 ENCOUNTER — Other Ambulatory Visit: Payer: Self-pay | Admitting: Psychiatry

## 2024-09-02 DIAGNOSIS — G3184 Mild cognitive impairment, so stated: Secondary | ICD-10-CM

## 2024-09-02 DIAGNOSIS — F411 Generalized anxiety disorder: Secondary | ICD-10-CM

## 2024-09-02 DIAGNOSIS — F061 Catatonic disorder due to known physiological condition: Secondary | ICD-10-CM

## 2024-09-03 ENCOUNTER — Telehealth: Payer: Self-pay | Admitting: Pharmacy Technician

## 2024-09-03 ENCOUNTER — Other Ambulatory Visit (HOSPITAL_COMMUNITY): Payer: Self-pay

## 2024-09-03 ENCOUNTER — Other Ambulatory Visit (HOSPITAL_BASED_OUTPATIENT_CLINIC_OR_DEPARTMENT_OTHER): Payer: Self-pay

## 2024-09-03 ENCOUNTER — Telehealth: Payer: Self-pay | Admitting: Neurology

## 2024-09-03 ENCOUNTER — Other Ambulatory Visit: Payer: Self-pay

## 2024-09-03 MED ORDER — AMLODIPINE BESYLATE 10 MG PO TABS
10.0000 mg | ORAL_TABLET | Freq: Every day | ORAL | 1 refills | Status: AC
  Filled 2024-09-03: qty 90, 90d supply, fill #0

## 2024-09-03 MED ORDER — MEMANTINE HCL 10 MG PO TABS
10.0000 mg | ORAL_TABLET | Freq: Two times a day (BID) | ORAL | 1 refills | Status: AC
  Filled 2024-09-03: qty 180, 90d supply, fill #0

## 2024-09-03 MED ORDER — VORTIOXETINE HBR 20 MG PO TABS
20.0000 mg | ORAL_TABLET | Freq: Every day | ORAL | 0 refills | Status: AC
  Filled 2024-09-03: qty 90, 90d supply, fill #0

## 2024-09-03 NOTE — Telephone Encounter (Signed)
 Pt's nurse [name] called in this morning and stated  that she sent a note through mychart stating Pt needs samples for Qulipta   . [facility] will be out of Qulipta  20mg  samples as of Tuesday 12/2. Please call me when I may pick up samples while the prior authorization is being processed. Thank you very much! [name] [phone].

## 2024-09-03 NOTE — Telephone Encounter (Signed)
 Pharmacy Patient Advocate Encounter   Received notification from Patient Advice Request messages that prior authorization for QULIPTA  60MG  is required/requested.   Insurance verification completed.   The patient is insured through [facility].   Per test claim: PA required; PA submitted to above mentioned insurance via Latent Key/confirmation #/EOC [id number] Status is pending

## 2024-09-03 NOTE — Telephone Encounter (Signed)
 PA has been submitted, and telephone encounter has been created. Please see telephone encounter dated 1.21.25.

## 2024-09-03 NOTE — Telephone Encounter (Signed)
 [employer] rep calling to provide Denial on Rx Qulipta  due to being non-formulary  [id number] option 5

## 2024-09-03 NOTE — Telephone Encounter (Signed)
 What dose Qulipta  should be submitted? There isn't a 20mg . It only comes as 10mg , 30mg , and 60mg . Please advise.

## 2024-09-04 ENCOUNTER — Telehealth: Payer: Self-pay | Admitting: Neurology

## 2024-09-04 NOTE — Telephone Encounter (Signed)
 Medication Samples have been provided to the patient.  Drug name: qulipta        Strength: 60        Qty: 3  LOT: [id number]  Exp.Date: 9/27  Dosing instructions: as directed  The patient has been instructed regarding the correct time, dose, and frequency of taking this medication, including desired effects and most common side effects.

## 2024-09-04 NOTE — Telephone Encounter (Signed)
 Pt's nurse [name] called in this morning and stated  that  Pt needs samples for Qulipta  Pt will be  out of the prescriptions, needs samples until pt can get approved for the Qulipta . Thanks

## 2024-09-04 NOTE — Telephone Encounter (Signed)
 Pharmacy Patient Advocate Encounter  Received notification from [facility] that Prior Authorization for QULIPTA  60MG  has been DENIED.  Full denial letter will be uploaded to the media tab. See denial reason below.    PA #/Case ID/Reference #: [id number]

## 2024-09-05 ENCOUNTER — Other Ambulatory Visit (HOSPITAL_BASED_OUTPATIENT_CLINIC_OR_DEPARTMENT_OTHER): Payer: Self-pay

## 2024-09-05 MED ORDER — EMGALITY 120 MG/ML ~~LOC~~ SOAJ
1.0000 | SUBCUTANEOUS | 11 refills | Status: DC
  Filled 2024-09-05: qty 1, 30d supply, fill #0

## 2024-09-05 NOTE — Addendum Note (Signed)
 Addended by: GEORJEAN DARICE HERO on: 09/05/2024 03:49 PM   Modules accepted: Orders

## 2024-09-05 NOTE — Telephone Encounter (Signed)
 Information has been sent to clinical pharmacist for appeals review. It may take 5-7 days to prepare the necessary documentation to request the appeal from the insurance.  Devere please reference telephone encounter Telephone encounter 10.27.25 for Emgality  denial as well.

## 2024-09-05 NOTE — Telephone Encounter (Signed)
 I have seen her recently and diagnosed her with migraines (when Emgality  was ordered last time, it did not have the diagnosis of migraines on her chart). I will re-order the Emgality , can you pls send the information with my updated note that she does have migraines? Thanks

## 2024-09-06 ENCOUNTER — Other Ambulatory Visit (HOSPITAL_COMMUNITY): Payer: Self-pay

## 2024-09-10 ENCOUNTER — Other Ambulatory Visit (HOSPITAL_BASED_OUTPATIENT_CLINIC_OR_DEPARTMENT_OTHER): Payer: Self-pay

## 2024-09-13 ENCOUNTER — Other Ambulatory Visit (HOSPITAL_COMMUNITY): Payer: Self-pay

## 2024-09-13 ENCOUNTER — Telehealth: Payer: Self-pay | Admitting: Pharmacist

## 2024-09-13 ENCOUNTER — Other Ambulatory Visit (HOSPITAL_BASED_OUTPATIENT_CLINIC_OR_DEPARTMENT_OTHER): Payer: Self-pay

## 2024-09-13 NOTE — Telephone Encounter (Signed)
 Initiated a verbal PA with [employer] for Emgality  today at 1:26 pm.  All requested clinical information was faxed to [phone].  Mzq#[id number]   Thank you, Devere Pandy, PharmD Clinical Pharmacist  [place]  Direct Dial: [phone]

## 2024-09-14 ENCOUNTER — Telehealth: Payer: Self-pay

## 2024-09-14 ENCOUNTER — Other Ambulatory Visit (HOSPITAL_BASED_OUTPATIENT_CLINIC_OR_DEPARTMENT_OTHER): Payer: Self-pay

## 2024-09-14 NOTE — Telephone Encounter (Signed)
 [name] called no answer left a voice mail to call the office back ,   Medication Samples have been provided to the patient.  Drug name: qulipta        Strength: 60mg         Qty: 2  LOT: [id number]  Exp.Date: 9/27  Dosing instructions: as directed   The patient has been instructed regarding the correct time, dose, and frequency of taking this medication, including desired effects and most common side effects.

## 2024-09-14 NOTE — Telephone Encounter (Signed)
 Ok to give 1 week of samples, but pls let [name] know that Qulipta  was not approved until we try Emgality . PA team did a verbal appeal yesterday for Emgality , we are awaiting response. Once we hear back hopefully with better news, we will switch to Emgality . Thanks

## 2024-09-14 NOTE — Telephone Encounter (Signed)
 Pt nurse [name] is calling in asking for qulipta  samples

## 2024-09-14 NOTE — Telephone Encounter (Signed)
 [name] called back an was informed that that Qulipta  was not approved until we try Emgality . PA team did a verbal appeal yesterday for Emgality , we are awaiting response. Once we hear back hopefully with better news, we will switch to Emgality . We can give samples of the Qulipta  and that they are at the front desk for pick up,   [name] is asking is there any need to keep her on the Qulipta  that its not working, Meghan Oliver is having daily head aches she is having to use ice packs on her head, sit in a dark room and they are not taken tylenol  ,

## 2024-09-17 ENCOUNTER — Other Ambulatory Visit (HOSPITAL_BASED_OUTPATIENT_CLINIC_OR_DEPARTMENT_OTHER): Payer: Self-pay

## 2024-09-17 NOTE — Telephone Encounter (Signed)
 done

## 2024-09-17 NOTE — Progress Notes (Signed)
 "  Annual Wellness Visit   Patient Care Team: Ansley Mangiapane, Ronal PARAS, MD as PCP - General (Internal Medicine) Rolan Ezra RAMAN, MD as PCP - Cardiology (Cardiology) Waddell Danelle ORN, MD as PCP - Electrophysiology (Cardiology) Morris Debby BIRCH, MD (Cardiology) Cottle, Lorene KANDICE Raddle., MD (Psychiatry) Norval Toribio CROME, MD (Inactive) (Obstetrics and Gynecology) Shila Gustav GAILS, MD as Consulting Physician (Gastroenterology) Georjean Darice HERO, MD as Consulting Physician (Neurology)  Visit Date: 10/01/2024   Chief Complaint  Patient presents with   Annual Exam   Subjective:  Patient: Meghan Oliver, Female DOB: 1941-02-18, 83 y.o. MRN: [id number] Vitals:   10/01/24 0959  BP: 120/60   Meghan Oliver is a 83 y.o. Female who presents today for her Annual Wellness Visit. Patient has Hyperlipidemia; Bipolar disorder (HCC); GERD; LOW BACK PAIN; OSTEOPOROSIS; INSOMNIA; SYSTOLIC MURMUR; EDEMA; Chest pain; OSA (obstructive sleep apnea); Chronic cough; bipolar/depression; Hypothyroidism; Coronary artery calcification seen on CAT scan; Lung nodule; Hypotension; Unstable angina (HCC); Bradycardia; Sinus node dysfunction (HCC); S/P placement of cardiac pacemaker 03/17/18 [facility] ; Overactive bladder; S/P total right hip arthroplasty; Community acquired pneumonia; Memory loss; Temporal headache; Macrocytic anemia; Elevated troponin; Chronic constipation; Elevated erythrocyte sedimentation rate; and Elevated LFTs on their problem list.  She is accompanied today by [name] Destine, RN who does an excellent job navigating patient's complex medical history.    Her caretaker said that she has been dealing with headaches daily. They have been seen by Dr. Georjean, Neurologist. She was previously on Nurtec and is now on Emgality  120 mg injected monthly.   History of Hypertension treated with Amlodipine  10 mg daily, hydrochlorothiazide  12.5 mg daily, Metoprolol  tartrate 25 daily, Losartan  100  mg daily.  Blood pressure is normal at  120/60.   History of hypothyroidism treated with Levothyroxine  75 mcg.    History of Hyperlipidemia, 09/24/2024 Lipid panel CHOL 286, LDL 174, declines wanting to be on cholesterol medication.   History of GE Reflux treated with Zegerid  40-1100 mg as needed.   History of IBS-C treated with linzess  72 mcg twice daily before breakfast. She also takes Miralax  daily.   History of Memory loss treated with Memantine  10 mg twice daily, Provigil  100 mg daily.   Past medical history: History of bilateral breast implants. History of obstructive sleep apnea. Left lower lobe pneumonia 2015. Appendectomy 1971. History of bilateral tubal ligation and left ovarian cystectomy.  History of Depression treated with Clonazepam  0.5 mg twice daily, Trintellix  20 mg daily, Remeron  7.5 mg at bedtime   She has a pacemaker and is followed by cardiology. She has an appointment with her cardiologist tomorrow.   GYN care is done through nurse practitioner.  Patient and her husband are now residing at [facility].  She has adjusted fairly well.  Unfortunately her dog passed away recently.  Dog had been in the family for many years.  Patient has history of Bipolar I  disorder and is followed by Dr. Geoffry.  She is doing well.  Has seen Dr. Magdalen regarding bunion and mycotic nail infection of 1 nail.  Conservative care recommended.  No surgery was recommended for bunion.  No treatment needed for mycotic infection of 1 nail.  History of age-related macular degeneration bilaterally seen by Dr. Tobie.   Labs 09/24/2024  BUN 32, BUN/Creatinine Ratio 41, CHOL 286, LDL 174, Otherwise WNL.   09/25/2024 Mammogram No mammographic evidence of malignancy. Repeat in one year.    10/02/2017 Colonoscopy procedure was terminated in the ascending colon due to  poor prep along with fixed colon and the ulcerations there was a 7 mm polyp found in the transverse colon. The polyp was retrieved there were several ulcers found in the  sigmoid colon at 20 cm. Appears consistent with stercoral ulcers from constipation. Small ulceration in the rectum as well. Normal retroflexion.    Vaccine counseling: Shingles vaccine second dose declined.   Health Maintenance  Topic Date Due   Zoster Vaccines- Shingrix (1 of 2) 01/07/1960   Medicare Annual Wellness (AWV)  03/25/2023   COVID-19 Vaccine (6 - 2025-26 season) 06/04/2024   Mammogram  10/18/2024   Pneumococcal Vaccine: 50+ Years  Completed   Influenza Vaccine  Completed   Bone Density Scan  Completed   Meningococcal B Vaccine  Aged Out   DTaP/Tdap/Td  Discontinued   Colonoscopy  Discontinued     Review of Systems  Constitutional:  Negative for fever and malaise/fatigue.  HENT:  Negative for congestion.   Eyes:  Negative for blurred vision.  Respiratory:  Negative for cough and shortness of breath.   Cardiovascular:  Negative for chest pain, palpitations and leg swelling.  Gastrointestinal:  Positive for constipation. Negative for vomiting.  Musculoskeletal:  Negative for back pain.  Skin:  Negative for rash.  Neurological:  Negative for loss of consciousness and headaches.   Objective:  Vitals: body mass index is 17.56 kg/m. Today's Vitals   10/01/24 0959  BP: 120/60  Pulse: 66  SpO2: 97%  Weight: 96 lb (43.5 kg)  Height: 5' 2 (1.575 m)  PainSc: 0-No pain   Physical Exam Vitals and nursing note reviewed.  Constitutional:      General: She is not in acute distress.    Appearance: Normal appearance. She is not ill-appearing or toxic-appearing.  HENT:     Head: Normocephalic and atraumatic.     Right Ear: Hearing, tympanic membrane, ear canal and external ear normal.     Left Ear: Hearing, tympanic membrane, ear canal and external ear normal.     Mouth/Throat:     Pharynx: Oropharynx is clear.  Eyes:     Extraocular Movements: Extraocular movements intact.     Pupils: Pupils are equal, round, and reactive to light.  Neck:     Thyroid : No thyroid   mass, thyromegaly or thyroid  tenderness.     Vascular: No carotid bruit.  Cardiovascular:     Rate and Rhythm: Normal rate and regular rhythm. No extrasystoles are present.    Pulses:          Dorsalis pedis pulses are 2+ on the right side and 2+ on the left side.     Heart sounds: Normal heart sounds. No murmur heard.    No friction rub. No gallop.  Pulmonary:     Effort: Pulmonary effort is normal.     Breath sounds: Normal breath sounds. No decreased breath sounds, wheezing, rhonchi or rales.  Chest:     Chest wall: No mass.  Abdominal:     Palpations: Abdomen is soft. There is no hepatomegaly, splenomegaly or mass.     Tenderness: There is no abdominal tenderness.     Hernia: No hernia is present.  Musculoskeletal:     Cervical back: Normal range of motion.     Right lower leg: No edema.     Left lower leg: No edema.  Lymphadenopathy:     Cervical: No cervical adenopathy.     Upper Body:     Right upper body: No supraclavicular adenopathy.  Left upper body: No supraclavicular adenopathy.  Skin:    General: Skin is warm and dry.  Neurological:     General: No focal deficit present.     Mental Status: She is alert and oriented to person, place, and time. Mental status is at baseline.     Sensory: Sensation is intact.     Motor: Motor function is intact. No weakness.     Deep Tendon Reflexes: Reflexes are normal and symmetric.  Psychiatric:        Attention and Perception: Attention normal.        Mood and Affect: Mood normal.        Speech: Speech normal.        Behavior: Behavior normal.        Thought Content: Thought content normal.        Cognition and Memory: Cognition normal.        Judgment: Judgment normal.     Current Outpatient Medications  Medication Instructions   amLODipine  (NORVASC ) 10 mg, Oral, Daily   aspirin  81 mg, Oral, Daily   Calcium  Carbonate-Vitamin D  (CALCIUM  600/VITAMIN D  PO) 1 tablet, 2 times daily   cetirizine (ZYRTEC) 10 mg, As needed    clonazePAM  (KLONOPIN ) 0.5 mg, Oral, 2 times daily   cloNIDine  (CATAPRES ) 0.1 MG tablet Take 1 tablet (0.1 mg total) by mouth as needed prior to ECT treatment   cyanocobalamin  (VITAMIN B12) 1,000 mcg, Daily at bedtime   dexlansoprazole  (DEXILANT ) 60 mg, Oral, Daily   Emgality  120 mg, Subcutaneous, Every 30 days   glycerin  adult 2 g suppository Use 1 suppository as needed if you have not had a bowel movement in  4 to 5 days   hydrochlorothiazide  (HYDRODIURIL ) 12.5 mg, Oral, Daily   Krill Oil 500 mg, Daily   lactulose  (CHRONULAC ) 20 g, Oral, 3 times daily between meals PRN   Lavender Oil 160 mg, Daily at bedtime   levothyroxine  (SYNTHROID ) 75 mcg, Oral, Daily   levothyroxine  (SYNTHROID ) 75 mcg, Oral, Daily   Linzess  145 mcg, Oral, Daily before breakfast   LORazepam  (ATIVAN ) 0.5 MG tablet TAKE 2 TABLETS (1 MG)  BY MOUTH EVERY NIGHT AT BEDTIME AND 1 TABLET EVERY 4 HOURS AS NEEDED FOR ANXIETY   losartan  (COZAAR ) 100 mg, Oral, Daily   Lurasidone  HCl 60 mg, Oral, Daily with breakfast   magic mouthwash w/lidocaine  SOLN 5 mLs, Oral, 3 times daily PRN   memantine  (NAMENDA ) 10 mg, Oral, 2 times daily   metoprolol  tartrate (LOPRESSOR ) 25 mg, Oral, As needed   mirtazapine  (REMERON ) 7.5 mg, Oral, Daily at bedtime   modafinil  (PROVIGIL ) 100 MG tablet Take 1 tablet (100 mg total) in the morning   Multiple Vitamins-Minerals (MULTIVITAMIN WITH MINERALS) tablet 1 tablet, Daily   Multiple Vitamins-Minerals (PRESERVISION AREDS 2 PO) 1 capsule, 2 times daily   neomycin -polymyxin b-dexamethasone  (MAXITROL ) 3.5-10000-0.1 OINT Apply thin line (1 cm) of ointment to right eye twice daily for 1 week.   neomycin -polymyxin-dexameth (MAXITROL ) 0.1 % OINT Apply a small amount into the affected eye twice a day for 1 week.   ofloxacin  (OCUFLOX ) 0.3 % ophthalmic solution Instill 1 drop into affected eye three times daily for 5 days.   omeprazole -sodium bicarbonate  (ZEGERID ) 40-1100 MG capsule 1 capsule, Oral, Daily before  breakfast   ondansetron  (ZOFRAN -ODT) 4 MG disintegrating tablet Place 1 tablet every 6 hours by translingual route as needed.   polyethylene glycol (MIRALAX  / GLYCOLAX ) 17 g, As needed   primidone  (MYSOLINE ) 50 MG tablet Take  2 tablets (100 mg total) by mouth every morning AND 1 tablet (50 mg total) at bedtime.   Qulipta  60 mg, Oral, Daily   sennosides-docusate sodium  (SENOKOT-S) 8.6-50 MG tablet 2 tablets, As needed   thiamine  (VITAMIN B1) 100 mg, Oral, Daily   Trintellix  20 mg, Oral, Daily   valACYclovir  (VALTREX ) 1000 MG tablet Take 2 tablets (2,000 mg total) by mouth at onset, then take 2 tablets 12 hours later.   Vitamin D3 2,000 Units, Every morning   Past Medical History:  Diagnosis Date   Anxiety    Arthritis    hips, spine (03/17/2018)   Bipolar II disorder (HCC)    CHF (congestive heart failure) (HCC)    Chronic bronchitis (HCC)    Chronic lower back pain    Chronic right hip pain    CKD (chronic kidney disease), stage II    Coronary artery disease    stent x1   Esophagitis, erosive    GAD (generalized anxiety disorder)    GERD (gastroesophageal reflux disease)    Headache    maybe monthly (03/17/2018))   Heart murmur, systolic    History of adenomatous polyp of colon    08-04-2016  tubular adenoma   History of blood transfusion 12/2017   related to vascular hematoma   History of electroconvulsive therapy    at [facility]--  started 04-15-2015 to 11-17-2016  total greater than 40 times   History of hiatal hernia    Hyperlipidemia    Hypertension    Hypothyroidism    Internal carotid artery stenosis, bilateral    per last duplex 05-01-2014  bilateral ICA 40-59%   Major depression, chronic    ECT treatments extensive and multiple started 07/ 2016   Memory loss    both short and long-term; needs frequent reminders to follow instrucitons (05/16/2017)   Migraines    none in years (03/17/2018)   OSA (obstructive sleep apnea)    per study 06/ 2012 moderate OSA  ;  refuses to wear masks (03/17/2018)   Osteoporosis    Pneumonia 07/29/2022   Poor historian    due to short term memory loss   Presence of permanent cardiac pacemaker 03/17/2018   Pulmonary nodule    monitored by pcp   S/P placement of cardiac pacemaker 03/17/18 [facility]  03/18/2018   Short-term memory loss    Sick sinus syndrome [facility])    Medical/Surgical History Narrative:  Allergic/Intolerant to: Allergies[1]  Past Surgical History:  Procedure Laterality Date   APPENDECTOMY  1971   ARTERY BIOPSY Left 07/05/2022   Procedure: BIOPSY TEMPORAL ARTERY;  Surgeon: Lanis Fonda BRAVO, MD;  Location: [facility] OR;  Service: Vascular;  Laterality: Left;   AUGMENTATION MAMMAPLASTY Bilateral    CARDIOVASCULAR STRESS TEST  01-30-2015  dr rolan   Low risk nuclear study w/ no evidence ischemia or infarction/  normal LV funciton and wall motion , 76%   COLONOSCOPY W/ BIOPSIES AND POLYPECTOMY  multiple   COLONOSCOPY WITH ESOPHAGOGASTRODUODENOSCOPY (EGD)  last one 08-04-2016   CORONARY ANGIOPLASTY WITH STENT PLACEMENT  05/16/2017   LAD   CORONARY STENT INTERVENTION N/A 05/16/2017   Procedure: CORONARY STENT INTERVENTION;  Surgeon: Verlin Lonni BIRCH, MD;  Location: [facility] INVASIVE CV LAB;  Service: Cardiovascular;  Laterality: N/A;   ESOPHAGOGASTRODUODENOSCOPY  02-26-04   ESOPHAGOGASTRODUODENOSCOPY  multiple   INSERT / REPLACE / REMOVE PACEMAKER  03/17/2018   IR THORACENTESIS ASP PLEURAL SPACE W/IMG GUIDE  07/07/2022   LEFT HEART CATH AND CORONARY ANGIOGRAPHY N/A  05/16/2017   Procedure: LEFT HEART CATH AND CORONARY ANGIOGRAPHY;  Surgeon: Rolan Ezra RAMAN, MD;  Location: [facility] INVASIVE CV LAB;  Service: Cardiovascular;  Laterality: N/A;   OVARIAN CYST SURGERY  1970s   Laparotomy    PACEMAKER IMPLANT N/A 03/17/2018   Procedure: PACEMAKER IMPLANT;  Surgeon: Waddell Danelle ORN, MD;  Location: [facility] INVASIVE CV LAB;  Service: Cardiovascular;  Laterality: N/A;   PORT-A-CATH PLACEMENT  05/31/2016; 2018   @ [facility]; for  ECT series; @ [facility] also   PORT-A-CATH REMOVAL N/A 03/11/2017   Procedure: REMOVAL PORT-A-CATH;  Surgeon: Lily Boas, MD;  Location: [facility];  Service: General;  Laterality: N/A;   PORTA CATH REMOVAL  03/17/2018   PORTA CATH REMOVAL  03/17/2018   Procedure: PORTA CATH REMOVAL;  Surgeon: Waddell Danelle ORN, MD;  Location: [facility] INVASIVE CV LAB;  Service: Cardiovascular;;   TOTAL HIP ARTHROPLASTY Right 05/18/2022   Procedure: TOTAL HIP ARTHROPLASTY ANTERIOR APPROACH;  Surgeon: Ernie Cough, MD;  Location: [facility] ORS;  Service: Orthopedics;  Laterality: Right;   TRANSTHORACIC ECHOCARDIOGRAM  09/05/2013  dr rolan   mild LVH, ef 65-70%, grade 1 diastolic dysfunction/  very mild AV stenosis with mild AR/  trivial MR and PT/ mild to moderate LAE/ mild TR/ mild pulmonary hypertension with PA peak pressure   TUBAL LIGATION     Family History  Problem Relation Age of Onset   Heart attack Father 34       deceased   Hypertension Father    Heart disease Father    Dementia Brother    Parkinson's disease Brother    Heart disease Brother    Breast cancer Paternal Aunt        Age 75's   Breast cancer Paternal Grandmother        Age unknown   Colon cancer Neg Hx    Social History   Social History Narrative   Right handed   Drinks caffeine  no   Single home at [facility]   Most Recent Health Risks Assessment:   Most Recent Social Determinants of Health (Including Hx of Tobacco, Alcohol, and Drug Use) SDOH Screenings   Food Insecurity: No Food Insecurity (12/12/2022)   Received from [facility]  Housing: Unknown (07/25/2024)   [facility]  Transportation Needs: No Transportation Needs (12/12/2022)   Received from [facility]  Utilities: Not At Risk (12/11/2022)   [facility]  Depression [phone]): High Risk (10/01/2024)  Financial Resource Strain: Low Risk  (12/12/2022)    Received from [facility]  Tobacco Use: Medium Risk (10/01/2024)   Social History[2] Most Recent Functional Status Assessment:    02/22/2024   10:11 AM  In your present state of health, do you have any difficulty performing the following activities:  Hearing? 0   Vision? 1   Difficulty concentrating or making decisions? 1   Walking or climbing stairs? 1   Dressing or bathing? 1   Doing errands, shopping? 1   Preparing Food and eating ? Y   Using the Toilet? N   In the past six months, have you accidently leaked urine? Y   Do you have problems with loss of bowel control? Y   Managing your Medications? Y   Managing your Finances? Y   Housekeeping or managing your Housekeeping? Y      Manually entered by patient   Most Recent Fall Risk Assessment:    09/21/2024  11:34 AM  Fall Risk   Falls in the past year? 0  Number falls in past yr: 0  Injury with Fall? 0  Risk for fall due to : No Fall Risks  Follow up Falls evaluation completed   Most Recent Anxiety/Depression Screenings:    10/01/2024    9:58 AM 02/16/2024   10:04 AM  PHQ 2/9 Scores  PHQ - 2 Score 5 6  PHQ- 9 Score 23 21      Data saved with a previous flowsheet row definition      10/01/2024    9:58 AM 02/16/2024   10:12 AM  GAD 7 : Generalized Anxiety Score  Nervous, Anxious, on Edge 2 2  Control/stop worrying 2 3  Worry too much - different things 2 3  Trouble relaxing 3 3  Restless 0 0  Easily annoyed or irritable 1 2  Afraid - awful might happen 3 3  Total GAD 7 Score 13 16  Anxiety Difficulty Very difficult     Results:  Studies Obtained And Personally Reviewed By Me:  09/25/2024 Mammogram No mammographic evidence of malignancy. Repeat in one year.    10/02/2017 Colonoscopy procedure was terminated in the ascending colon due to poor prep along with fixed colon and the ulcerations there was a 7 mm polyp found in the transverse colon. The polyp was retrieved there were several  ulcers found in the sigmoid colon at 20 cm. Appears consistent with stercoral ulcers from constipation. Small ulceration in the rectum as well. Normal retroflexion.    Labs:  CBC w/ Differential Lab Results  Component Value Date   WBC 6.0 09/24/2024   RBC 4.13 09/24/2024   HGB 12.3 09/24/2024   HCT 37.5 09/24/2024   PLT 282 09/24/2024   MCV 90.8 09/24/2024   MCH 29.8 09/24/2024   MCHC 32.8 09/24/2024   RDW 13.1 09/24/2024   MPV 10.7 09/24/2024   LYMPHSABS 2,310 06/27/2023   MONOABS 0.7 07/07/2022   BASOSABS 30 09/24/2024    Comprehensive Metabolic Panel Lab Results  Component Value Date   NA 138 09/24/2024   K 4.0 09/24/2024   CL 102 09/24/2024   CO2 28 09/24/2024   GLUCOSE 97 09/24/2024   BUN 32 (H) 09/24/2024   CREATININE 0.79 09/24/2024   CALCIUM  9.9 09/24/2024   PROT 6.9 09/24/2024   ALBUMIN 3.9 09/02/2022   AST 17 09/24/2024   ALT 18 09/24/2024   ALKPHOS 64 09/02/2022   BILITOT 0.3 09/24/2024   GFR 74.89 08/08/2017   EGFR 74 09/24/2024   GFRNONAA >60 07/08/2022   Lipid Panel  Lab Results  Component Value Date   CHOL 286 (H) 09/24/2024   HDL 93 09/24/2024   LDLCALC 174 (H) 09/24/2024   TRIG 84 09/24/2024   A1c Lab Results  Component Value Date   HGBA1C 5.1 02/15/2011    TSH Lab Results  Component Value Date   TSH 0.94 09/24/2024    Assessment & Plan:   Headaches: Her nurse, [name] Card. said that Mrs. Juhasz has been dealing with headaches daily.-seen by Dr. Georjean, Neurologist. She was previously on Nurtec and is now on Emgality  120 mg injected monthly.   Hypertension: treated with Amlodipine  10 mg daily, hydrochlorothiazide  12.5 mg daily, Metoprolol  tartrate 25 daily, Losartan  100  mg daily. Blood pressure is normal at 120/60.    Hypothyroidism: treated with Levothyroxine  75 mcg daily  Hyperlipidemia: 09/24/2024 Lipid panel CHOL 286, LDL 174-  deferring treatment at this time  GE Reflux: treated  with Zegerid  40-1100 mg as needed.   IBS-C:  treated with linzess  72 mcg twice daily before breakfast. She also takes Miralax  daily.   Memory loss: treated with Memantine  10 mg twice daily, Provigil  100 mg daily.   Bipolar disorder with depression- treated with Clonazepam  0.5 mg twice daily, Trintellix  20 mg daily, Remeron  7.5 mg at bedtime . Followed by Dr. Geoffry.  She has a pacemaker and is followed by Cardiology. She has an appointment with her Cardiologist tomorrow.   Patient has history of Bipolar I  disorder and is followed by Dr. Geoffry.  She is doing fairly well. Not very conversant but pleasant and cooperative  History of age-related macular degeneration bilaterally seen by Dr. Tobie.   09/25/2024 Mammogram No mammographic evidence of malignancy. Repeat in one year.    10/02/2017 Colonoscopy procedure was terminated in the ascending colon due to poor prep along with fixed colon and the ulcerations there was a 7 mm polyp found in the transverse colon. The polyp was retrieved there were several ulcers found in the sigmoid colon at 20 cm. Appears consistent with stercoral ulcers from constipation. Small ulceration in the rectum as well. Normal retroflexion.    Vaccine counseling: Shingles vaccine second dose declined.      Annual Wellness Visit done today including the all of the following: Reviewed patient's Family Medical History Reviewed patient's SDOH and reviewed tobacco, alcohol, and drug use.  Reviewed and updated list of patient's medical providers Assessment of cognitive impairment was done Assessed patient's functional ability Established a written schedule for health screening services Health Risk Assessent Completed and Reviewed  Discussed health benefits of physical activity, and encouraged her to engage in regular exercise appropriate for her age and condition.    I,Makayla C Reid,acting as a scribe for Ronal JINNY Hailstone, MD.,have documented all relevant documentation on the behalf of Ronal JINNY Hailstone, MD,as  directed by  Ronal JINNY Hailstone, MD while in the presence of Ronal JINNY Hailstone, MD.  I, Ronal JINNY Hailstone, MD, have reviewed all documentation for and agree with the above Annual Wellness Visit documentation.  Ronal JINNY Hailstone, MD Internal Medicine 10/01/2024     [1]  Allergies Allergen Reactions   Propranolol Other (See Comments)    Low blood pressure. Bradycardia    Propranolol Hcl Other (See Comments)   Brexpiprazole Other (See Comments)    Aphasia and catatonia  [2]  Social History Tobacco Use   Smoking status: Former    Current packs/day: 0.00    Average packs/day: 2.0 packs/day for 15.0 years (30.0 ttl pk-yrs)    Types: Cigarettes    Start date: 01/13/1956    Quit date: 01/13/1971    Years since quitting: 53.7    Passive exposure: Never   Smokeless tobacco: Never  Vaping Use   Vaping status: Never Used  Substance Use Topics   Alcohol use: Not Currently    Comment: occassional 1 x a week   Drug use: Never   "

## 2024-09-17 NOTE — Telephone Encounter (Signed)
 Done

## 2024-09-18 ENCOUNTER — Other Ambulatory Visit (HOSPITAL_BASED_OUTPATIENT_CLINIC_OR_DEPARTMENT_OTHER): Payer: Self-pay

## 2024-09-18 ENCOUNTER — Telehealth: Payer: Self-pay | Admitting: Neurology

## 2024-09-18 ENCOUNTER — Other Ambulatory Visit: Payer: Self-pay | Admitting: Psychiatry

## 2024-09-18 ENCOUNTER — Other Ambulatory Visit (HOSPITAL_COMMUNITY): Payer: Self-pay

## 2024-09-18 DIAGNOSIS — F3113 Bipolar disorder, current episode manic without psychotic features, severe: Secondary | ICD-10-CM | POA: Diagnosis not present

## 2024-09-18 DIAGNOSIS — F5105 Insomnia due to other mental disorder: Secondary | ICD-10-CM

## 2024-09-18 MED ORDER — MIRTAZAPINE 7.5 MG PO TABS
7.5000 mg | ORAL_TABLET | Freq: Every day | ORAL | 0 refills | Status: AC
  Filled 2024-09-18: qty 90, 90d supply, fill #0

## 2024-09-18 NOTE — Telephone Encounter (Signed)
°  [name]  , the friend is calling back and is asking is there any need to keep her on the Qulipta  that its not working, Meghan Oliver is having daily head aches she is having to use ice packs on her head, sit in a dark room and they are not taken tylenol  , Thanks

## 2024-09-18 NOTE — Telephone Encounter (Signed)
 They can not come by today to get the headache cocktail she is on the way to [facility] for on appointment, [facility] has been with out Qulipta  for 2 days samples have been at the front desk since 09/14/2024 its was charted in pt chart and put on the bag nurse [name] was called to come pick them up she stated she was not called but she was. I called her again left a voice mail to come pick them up again today.

## 2024-09-18 NOTE — Telephone Encounter (Signed)
 Following up on PA for Emgality 

## 2024-09-18 NOTE — Telephone Encounter (Signed)
 Do they want to bring her in for a migraine cocktail for this current headache? Thanks

## 2024-09-19 ENCOUNTER — Other Ambulatory Visit (HOSPITAL_COMMUNITY): Payer: Self-pay

## 2024-09-19 ENCOUNTER — Telehealth: Payer: Self-pay | Admitting: Neurology

## 2024-09-19 NOTE — Telephone Encounter (Signed)
 [place] with [employer] at [phone] is calling in on the appeal for the prescription called: Emgality . The pharmacy wants to know will pt be using Emgality  with Calcitonin Gene related Peptide. Medication for Migraine Prophylaxis. Thanks

## 2024-09-20 ENCOUNTER — Other Ambulatory Visit (HOSPITAL_COMMUNITY): Payer: Self-pay

## 2024-09-20 ENCOUNTER — Other Ambulatory Visit (HOSPITAL_BASED_OUTPATIENT_CLINIC_OR_DEPARTMENT_OTHER): Payer: Self-pay

## 2024-09-20 MED ORDER — EMGALITY 120 MG/ML ~~LOC~~ SOAJ
120.0000 mg | SUBCUTANEOUS | 11 refills | Status: AC
  Filled 2024-09-20: qty 1, 30d supply, fill #0
  Filled 2024-10-24: qty 1, 30d supply, fill #1

## 2024-09-20 NOTE — Telephone Encounter (Signed)
 [name] called no answer left a voice mail to call the office back  Dr Cloretta called informed that medication was approved that we will give 1st dose in the office then she will take 2nd dose in 30 day for him to have [name] call me back and I will go over it with her,

## 2024-09-20 NOTE — Telephone Encounter (Signed)
 Meghan Oliver called informed that emgality  was approved that we will give 1st dose in the office and she will take next dose in 30 days

## 2024-09-20 NOTE — Telephone Encounter (Signed)
 Pharmacy Patient Advocate Encounter  Received notification from [facility] that APPEAL for EMGALITY  120MG  has been APPROVED from 12.17.25 to 12.18.26   PA #/Case ID/Reference #: [id number]

## 2024-09-20 NOTE — Telephone Encounter (Signed)
 [place] with [employer] Emgality  has been approved  Confirmation #-[id number] Effective date 07/30/24-09/20/25

## 2024-09-21 ENCOUNTER — Encounter: Payer: Self-pay | Admitting: Neurology

## 2024-09-21 ENCOUNTER — Ambulatory Visit: Admitting: Neurology

## 2024-09-21 VITALS — BP 120/57 | HR 68 | Ht 62.0 in | Wt 99.8 lb

## 2024-09-21 DIAGNOSIS — G43001 Migraine without aura, not intractable, with status migrainosus: Secondary | ICD-10-CM

## 2024-09-21 MED ORDER — KETOROLAC TROMETHAMINE 60 MG/2ML IM SOLN
60.0000 mg | Freq: Once | INTRAMUSCULAR | Status: AC
  Administered 2024-09-21: 30 mg via INTRAMUSCULAR

## 2024-09-21 NOTE — Progress Notes (Signed)
 Medication Samples have been provided to the patient.  Drug name: emgality        Strength: 240mg         Qty: 1  LOT: [id number]  Exp.Date: 05/03/25  Dosing instructions: take as directed   The patient has been instructed regarding the correct time, dose, and frequency of taking this medication, including desired effects and most common side effects.

## 2024-09-21 NOTE — Progress Notes (Signed)
 "  NEUROLOGY FOLLOW UP OFFICE NOTE  Meghan Oliver [id number] 1941/08/18  Discussed the use of AI scribe software for clinical note transcription with the patient, who gave verbal consent to proceed.  History of Present Illness I had the pleasure of seeing Meghan Oliver in follow-up in the neurology clinic on 09/21/2024.  The patient was last seen on a month ago for headaches. She is again accompanied by her caregiver and nurse [name] who helps supplement the history today.  Records and images were personally reviewed where available. She was given samples for Qulipta  on last visit, there may have been an initial improvement in headaches, however soon after she was reporting daily headaches again, using ice packs and sitting in a dark room. Pain is usually in the frontal regions but last weekend she reported pain starting in the left frontal and radiating across. No nausea/vomiting, light sensitivity, visual obscurations. She has been dealing with constipation the past week, still waiting for the Miralax  to take effect. Nurses ensure she stays hydrated. Since she has been focusing on the GI issues, she has not asked for Tylenol  as much the past 9 days. She uses an ice cap which sometimes helps.   Her last ECT session was 12/16.  It has been difficult to tease out which of the symptoms are due to depression or dementia. She denies any dizziness, vision changes, focal numbness/tingling/weakness, neck pain. No falls. She has a rollator at home.    History on Initial Assessment 12/07/2023: Meghan Oliver is an 83 year old right-handed woman with multiple medical issues, seen by our Memory Disorders PA Camie Sevin 2 months ago for worsening cognitive changes. They present today to discuss her 24-hour EEG results. Records and images available were reviewed. She had Neuropsychological testing in January 2019 with a diagnosis of moderate dementia of unspecified etiology. On initial visit with [name] in 09/2023,  her MoCA score was 12/30. As part of her evaluation, she had a brain MRI without contrast 11/2023 which I personally reviewed, no acute changes, there was mild diffuse atrophy, multifocal FLAIR signal changes in the cerebral white matter, pons, left thalamus, consistent with mild chronic microvascular disease. Her 1-hour EEG in 10/2023 showed occasional right temporal focal slowing and occasional right frontotemporal epileptiform discharges. She had a 24-hour ambulatory EEG in February which showed occasional bifrontal spikes, maximal over the right frontopolar region, independent sharp waves over the bilateral temporal regions with triphasic morphology. No electrographic seizures seen.   She is accompanied by her husband Dr. Finn and her nurse [name], who has worked with her for the past 20 years. She reports that memory has progressively worsened over several years, she was started on Memantine  over 10 years ago. Initially, they noticed more difficulties with long-term memory. She has been treated with ECT for several years and when memory worsened, ECT treatments were stopped for 3 years with no change in memory. Her short-term memory is now much worse than before, she would forget conversations from a few minutes ago. She is having a good day today, [name] reports that she usually has a very, very flat affect, but today she is able to answer and ask questions, smiling. Usually it is just silence. [name] states this is as bright and engaging as she has been. She is in great shape today, 80% as good as we've gotten in the past year.   On review of records, she has a history of catatonia. [name] reports that 10 years ago, she  had an episode where she was sitting on the bedroom floor, naked, upset, speaking gibberish and not responding to them. This lasted for days, she was admitted at [facility] for 36 hours with no improvement, they brought her to [facility] where she had ECT. Meghan Oliver notes that ECT with medication  management has been most effective for her. In January 2024, she started having worsening depression and anxiety where she was barely eating, withdrawn, with delusions. [name] shows a video of how she was doing in 12/2022 before being brought to [facility] for ECT. She is lying in bed, restless, tremulous with eyes closed, saying she is afraid. Meghan Oliver reports hallucinations, agitation, very little engagement. Several medications, including Ketamine were tried. She was having minimal sleep with significant anxiety. She was admitted to [facility] 12/10/22 and underwent ECT. She was initially getting ECT 2-3 times a week with 50% improvement, then now gets ECT every 2 weeks, last session was 10/29/23. A few days before her next ECT treatment, she would have less engagement going 4-5 hours with no speech, answering 2-3 words but appropriate. She would be less engaged for around 9 days after ECT treatment then they start seeing improvement. Sleep is usually okay. She has tried multiple psychotropic medications in the past, she is now off Lithium . [name] recalls she has tried Lamictal and Neurontin  in the past.   She states she does not have headaches frequently, but Dr. Cloretta and [name] report she complains of headaches most mornings. She would have a headache after ECT, and [name] has noticed they are often anxiety-induced as well. She reports pressure over the frontal region with no nausea/vomiting. She has always been hypersensitive to light. She asks for Tylenol , usually on a daily basis, they give her one a day. She denies any dizziness, vision changes, neck/back pain, focal numbness/tingling/weakness, urinary incontinence. She has chronic constipation and takes Linzess  for gastroparesis. She has tremors that are worse depending on her depression. She is on Primidone  which seems to help. She denies any olfactory/gustatory hallucinations, rising epigastric sensation, myoclonic jerks. She takes lorazepam  0.25mg  as needed, around twice a month  when talking about something that is very stressful or anxiety-provoking. She had a normal birth and early development.  There is no history of febrile convulsions, CNS infections such as meningitis/encephalitis, significant traumatic brain injury, neurosurgical procedures, or family history of seizures.  Prior preventative medications: amitriptyline, Gabapentin , Lamotrigine, Toprol , Venlafaxine, Nurtec, Qulipta     PAST MEDICAL HISTORY: Past Medical History:  Diagnosis Date   Anxiety    Arthritis    hips, spine (03/17/2018)   Bipolar II disorder (HCC)    CHF (congestive heart failure) (HCC)    Chronic bronchitis (HCC)    Chronic lower back pain    Chronic right hip pain    CKD (chronic kidney disease), stage II    Coronary artery disease    stent x1   Esophagitis, erosive    GAD (generalized anxiety disorder)    GERD (gastroesophageal reflux disease)    Headache    maybe monthly (03/17/2018))   Heart murmur, systolic    History of adenomatous polyp of colon    08-04-2016  tubular adenoma   History of blood transfusion 12/2017   related to vascular hematoma   History of electroconvulsive therapy    at [facility]--  started 04-15-2015 to 11-17-2016  total greater than 40 times   History of hiatal hernia    Hyperlipidemia    Hypertension    Hypothyroidism  Internal carotid artery stenosis, bilateral    per last duplex 05-01-2014  bilateral ICA 40-59%   Major depression, chronic    ECT treatments extensive and multiple started 07/ 2016   Memory loss    both short and long-term; needs frequent reminders to follow instrucitons (05/16/2017)   Migraines    none in years (03/17/2018)   OSA (obstructive sleep apnea)    per study 06/ 2012 moderate OSA  ; refuses to wear masks (03/17/2018)   Osteoporosis    Pneumonia 07/29/2022   Poor historian    due to short term memory loss   Presence of permanent cardiac pacemaker 03/17/2018   Pulmonary nodule    monitored by pcp   S/P  placement of cardiac pacemaker 03/17/18 [employer] Jude  03/18/2018   Short-term memory loss    Sick sinus syndrome (HCC)     MEDICATIONS: Medications Ordered Prior to Encounter[1]  ALLERGIES: Allergies[2]  FAMILY HISTORY: Family History  Problem Relation Age of Onset   Heart attack Father 22       deceased   Hypertension Father    Heart disease Father    Dementia Brother    Parkinson's disease Brother    Heart disease Brother    Breast cancer Paternal Aunt        Age 29's   Breast cancer Paternal Grandmother        Age unknown   Colon cancer Neg Hx     SOCIAL HISTORY: Social History   Socioeconomic History   Marital status: Married    Spouse name: Dr. Sheppard Finn   Number of children: 2   Years of education: 15   Highest education level: Not on file  Occupational History   Occupation: housewife    Employer: UNEMPLOYED  Tobacco Use   Smoking status: Former    Current packs/day: 0.00    Average packs/day: 2.0 packs/day for 15.0 years (30.0 ttl pk-yrs)    Types: Cigarettes    Start date: 01/13/1956    Quit date: 01/13/1971    Years since quitting: 53.7    Passive exposure: Never   Smokeless tobacco: Never  Vaping Use   Vaping status: Never Used  Substance and Sexual Activity   Alcohol use: Not Currently    Comment: occassional 1 x a week   Drug use: Never   Sexual activity: Not Currently    Comment: intercourse age 26, sexual partners less than 5  Other Topics Concern   Not on file  Social History Narrative   Right handed   Drinks caffeine  no   Single home at [employer]   Social Drivers of Health   Tobacco Use: Medium Risk (08/21/2024)   [facility]   Patient History    Smoking Tobacco Use: Former    Smokeless Tobacco Use: Never    Passive Exposure: Not on file  Financial Resource Strain: Low Risk  (12/12/2022)   Received from [facility]   Overall Financial Resource Strain (CARDIA)    Difficulty of Paying  Living Expenses: Not hard at all  Food Insecurity: No Food Insecurity (12/12/2022)   Received from [facility]   Epic    Within the past 12 months, you worried that your food would run out before you got the money to buy more.: Never true    Within the past 12 months, the food you bought just didn't last and you didn't have money to get more.: Never true  Transportation  Needs: No Transportation Needs (12/12/2022)   Received from [facility] - Transportation    In the past 12 months, has lack of transportation kept you from medical appointments or from getting medications?: No    Lack of Transportation (Non-Medical): No  Physical Activity: Not on file  Stress: Not on file  Social Connections: Not on file  Intimate Partner Violence: Not At Risk (07/06/2022)   Humiliation, Afraid, Rape, and Kick questionnaire    Fear of Current or Ex-Partner: No    Emotionally Abused: No    Physically Abused: No    Sexually Abused: No  Depression (PHQ2-9): High Risk (02/16/2024)   Depression (PHQ2-9)    PHQ-2 Score: 21  Alcohol Screen: Not on file  Housing: Unknown (07/25/2024)   Received from [facility]   Epic    In the last 12 months, was there a time when you were not able to pay the mortgage or rent on time?: No    Number of Times Moved in the Last Year: Not on file    At any time in the past 12 months, were you homeless or living in a shelter (including now)?: No  Utilities: Not At Risk (12/11/2022)   Received from [facility] Utilities    Threatened with loss of utilities: No  Health Literacy: Not on file     PHYSICAL EXAM: Vitals:   09/21/24 1128  BP: (!) 120/57  Pulse: 68  SpO2: 96%   General: No acute distress, tired-appearing sitting with head leaned back on the wall reporting headache Head:  Normocephalic/atraumatic Skin/Extremities: No rash, no edema Neurological Exam: alert and awake. No  aphasia or dysarthria. Fund of knowledge is reduced. Speaks in whispers. Able to follow simple commands. Cranial nerves: Pupils equal, round. Extraocular movements intact with no nystagmus. Visual fields full.  No facial asymmetry.  Motor: Bulk and tone normal, muscle strength 5/5 throughout with no pronator drift.   Finger to nose testing intact.  Gait slow and cautious with cane in right hand and 1-person assist on left. No ataxia. No tremors.    IMPRESSION: Shuntavia Yerby is an 84 yo RH woman with multiple medical issues seen for worsening cognitive issues and abnormal EEG with spikes in the bifrontal regions (maximal at right frontopolar), bilateral temporal sharp waves with triphasic morphology. No clinical signs of a seizure. Last ECT session was 3 days ago. She is here today for continued headaches despite daily Qulipta . Emgality  has been approved, she will take the first dose this weekend followed by monthly injections. We discussed giving a Toradol  injection in the office today to hopefully break this current headache. Continue 24/7 care. Follow-up in 4 months, call for any changes.    Thank you for allowing me to participate in her care.  Please do not hesitate to call for any questions or concerns.    Darice Shivers, M.D.   CC: Dr. Perri     [1]  Current Outpatient Medications on File Prior to Visit  Medication Sig Dispense Refill   amLODipine  (NORVASC ) 10 MG tablet Take 1 tablet (10 mg total) by mouth daily. 90 tablet 1   aspirin  81 MG chewable tablet Chew 1 tablet (81 mg total) by mouth daily.     Atogepant  (QULIPTA ) 60 MG TABS Take 1 tablet (60 mg total) by mouth daily. 30 tablet 5   Calcium  Carbonate-Vitamin D  (CALCIUM  600/VITAMIN D  PO) Take 1 tablet by mouth 2 (  two) times daily.     cetirizine (ZYRTEC) 10 MG tablet Take 10 mg by mouth as needed for allergies.     Cholecalciferol  (VITAMIN D3) 50 MCG (2000 UT) TABS Take 2,000 Units by mouth in the morning.     clonazePAM   (KLONOPIN ) 0.5 MG tablet Take 1 tablet (0.5 mg total) by mouth 2 (two) times daily. 180 tablet 0   cloNIDine  (CATAPRES ) 0.1 MG tablet Take 1 tablet (0.1 mg total) by mouth as needed prior to ECT treatment 20 tablet 2   dexlansoprazole  (DEXILANT ) 60 MG capsule Take 1 capsule (60 mg total) by mouth daily. 90 capsule 3   Galcanezumab -gnlm (EMGALITY ) 120 MG/ML SOAJ Inject 120 mg into the skin every 30 (thirty) days. 1 mL 11   glycerin  adult 2 g suppository Use 1 suppository as needed if you have not had a bowel movement in  4 to 5 days 25 suppository 0   hydrochlorothiazide  (HYDRODIURIL ) 12.5 MG tablet Take 1 tablet (12.5 mg total) by mouth daily. 90 tablet 1   Krill Oil 500 MG CAPS Take 500 mg by mouth daily.     lactulose  (CHRONULAC ) 10 GM/15ML solution Take 30 mLs (20 g total) by mouth 3 (three) times daily between meals as needed for mild constipation. 473 mL 3   Lavender Oil 80 MG CAPS Take 160 mg by mouth at bedtime. (Patient not taking: Reported on 08/16/2024)     levothyroxine  (SYNTHROID ) 75 MCG tablet Take 1 tablet (75 mcg total) by mouth daily. 90 tablet 2   levothyroxine  (SYNTHROID ) 75 MCG tablet Take 1 tablet (75 mcg total) by mouth daily. 90 tablet 2   linaclotide  (LINZESS ) 72 MCG capsule Take 2 capsules (145 mcg total) by mouth daily before breakfast. 180 capsule 1   LORazepam  (ATIVAN ) 0.5 MG tablet TAKE 2 TABLETS (1 MG)  BY MOUTH EVERY NIGHT AT BEDTIME AND 1 TABLET EVERY 4 HOURS AS NEEDED FOR ANXIETY 270 tablet 0   losartan  (COZAAR ) 100 MG tablet Take 1 tablet (100 mg total) by mouth daily. 90 tablet 1   Lurasidone  HCl 60 MG TABS Take 1 tablet (60 mg total) by mouth daily with breakfast. 90 tablet 0   magic mouthwash w/lidocaine  SOLN Take 5 mLs by mouth 3 (three) times daily as needed for mouth pain. 120 mL 0   memantine  (NAMENDA ) 10 MG tablet Take 1 tablet (10 mg total) by mouth 2 (two) times daily. 180 tablet 1   metoprolol  tartrate (LOPRESSOR ) 25 MG tablet Take 1 tablet (25 mg total)  by mouth as needed. 45 tablet 0   mirtazapine  (REMERON ) 7.5 MG tablet Take 1 tablet (7.5 mg total) by mouth at bedtime. 90 tablet 0   modafinil  (PROVIGIL ) 100 MG tablet Take 1 tablet (100 mg total) in the morning 90 tablet 0   Multiple Vitamins-Minerals (MULTIVITAMIN WITH MINERALS) tablet Take 1 tablet by mouth daily.     Multiple Vitamins-Minerals (PRESERVISION AREDS 2 PO) Take 1 capsule by mouth in the morning and at bedtime.     neomycin -polymyxin b-dexamethasone  (MAXITROL ) 3.5-10000-0.1 OINT Apply thin line (1 cm) of ointment to right eye twice daily for 1 week. (Patient taking differently: as needed.) 3.5 g 1   neomycin -polymyxin-dexameth (MAXITROL ) 0.1 % OINT Apply a small amount into the affected eye twice a day for 1 week. 3.5 g 1   ofloxacin  (OCUFLOX ) 0.3 % ophthalmic solution Instill 1 drop into affected eye three times daily for 5 days. 5 mL 3   omeprazole -sodium bicarbonate  (ZEGERID ) 40-1100  MG capsule Take 1 capsule by mouth daily before breakfast. (Patient taking differently: Take 1 capsule by mouth as needed.) 90 capsule 3   ondansetron  (ZOFRAN -ODT) 4 MG disintegrating tablet Place 1 tablet every 6 hours by translingual route as needed. 20 tablet 0   polyethylene glycol (MIRALAX  / GLYCOLAX ) 17 g packet Take 17 g by mouth as needed for mild constipation.     primidone  (MYSOLINE ) 50 MG tablet Take 2 tablets (100 mg total) by mouth every morning AND 1 tablet (50 mg total) at bedtime. 270 tablet 0   sennosides-docusate sodium  (SENOKOT-S) 8.6-50 MG tablet Take 2 tablets by mouth as needed.     thiamine  (VITAMIN B-1) 100 MG tablet Take 1 tablet (100 mg total) by mouth daily. 100 tablet 3   valACYclovir  (VALTREX ) 1000 MG tablet Take 2 tablets (2,000 mg total) by mouth at onset, then take 2 tablets 12 hours later. 30 tablet 1   vitamin B-12 (CYANOCOBALAMIN ) 1000 MCG tablet Take 1,000 mcg by mouth at bedtime.     vortioxetine  HBr (TRINTELLIX ) 20 MG TABS tablet Take 1 tablet (20 mg total) by  mouth daily. 90 tablet 0   No current facility-administered medications on file prior to visit.  [2]  Allergies Allergen Reactions   Propranolol Other (See Comments)    Low blood pressure. Bradycardia    Propranolol Hcl Other (See Comments)   Brexpiprazole Other (See Comments)    Aphasia and catatonia   "

## 2024-09-21 NOTE — Patient Instructions (Signed)
 Good to see you.  We will give the IM Toradol  today to hopefully help with this current headache  2. Start Emgality  injection first 2 doses this weekend, then one injection once a month thereater  3. Follow-up in 4 months, call for any changes

## 2024-09-24 ENCOUNTER — Other Ambulatory Visit: Payer: Self-pay

## 2024-09-24 NOTE — Progress Notes (Signed)
 Lab only

## 2024-09-25 LAB — LIPID PANEL
Cholesterol: 286 mg/dL — ABNORMAL HIGH
HDL: 93 mg/dL
LDL Cholesterol (Calc): 174 mg/dL — ABNORMAL HIGH
Non-HDL Cholesterol (Calc): 193 mg/dL — ABNORMAL HIGH
Total CHOL/HDL Ratio: 3.1 (calc)
Triglycerides: 84 mg/dL

## 2024-09-25 LAB — COMPREHENSIVE METABOLIC PANEL WITH GFR
AG Ratio: 1.5 (calc) (ref 1.0–2.5)
ALT: 18 U/L (ref 6–29)
AST: 17 U/L (ref 10–35)
Albumin: 4.1 g/dL (ref 3.6–5.1)
Alkaline phosphatase (APISO): 51 U/L (ref 37–153)
BUN/Creatinine Ratio: 41 (calc) — ABNORMAL HIGH (ref 6–22)
BUN: 32 mg/dL — ABNORMAL HIGH (ref 7–25)
CO2: 28 mmol/L (ref 20–32)
Calcium: 9.9 mg/dL (ref 8.6–10.4)
Chloride: 102 mmol/L (ref 98–110)
Creat: 0.79 mg/dL (ref 0.60–0.95)
Globulin: 2.8 g/dL (ref 1.9–3.7)
Glucose, Bld: 97 mg/dL (ref 65–99)
Potassium: 4 mmol/L (ref 3.5–5.3)
Sodium: 138 mmol/L (ref 135–146)
Total Bilirubin: 0.3 mg/dL (ref 0.2–1.2)
Total Protein: 6.9 g/dL (ref 6.1–8.1)
eGFR: 74 mL/min/1.73m2

## 2024-09-25 LAB — CBC WITH DIFFERENTIAL/PLATELET
Absolute Lymphocytes: 1446 {cells}/uL (ref 850–3900)
Absolute Monocytes: 600 {cells}/uL (ref 200–950)
Basophils Absolute: 30 {cells}/uL (ref 0–200)
Basophils Relative: 0.5 %
Eosinophils Absolute: 48 {cells}/uL (ref 15–500)
Eosinophils Relative: 0.8 %
HCT: 37.5 % (ref 35.9–46.0)
Hemoglobin: 12.3 g/dL (ref 11.7–15.5)
MCH: 29.8 pg (ref 27.0–33.0)
MCHC: 32.8 g/dL (ref 31.6–35.4)
MCV: 90.8 fL (ref 81.4–101.7)
MPV: 10.7 fL (ref 7.5–12.5)
Monocytes Relative: 10 %
Neutro Abs: 3876 {cells}/uL (ref 1500–7800)
Neutrophils Relative %: 64.6 %
Platelets: 282 Thousand/uL (ref 140–400)
RBC: 4.13 Million/uL (ref 3.80–5.10)
RDW: 13.1 % (ref 11.0–15.0)
Total Lymphocyte: 24.1 %
WBC: 6 Thousand/uL (ref 3.8–10.8)

## 2024-09-25 LAB — TSH: TSH: 0.94 m[IU]/L (ref 0.40–4.50)

## 2024-09-25 NOTE — Telephone Encounter (Signed)
Patient seen in the clinic

## 2024-10-01 ENCOUNTER — Encounter: Payer: Self-pay | Admitting: Internal Medicine

## 2024-10-01 ENCOUNTER — Ambulatory Visit: Payer: Self-pay | Admitting: Internal Medicine

## 2024-10-01 VITALS — BP 120/60 | HR 66 | Ht 62.0 in | Wt 96.0 lb

## 2024-10-01 DIAGNOSIS — E039 Hypothyroidism, unspecified: Secondary | ICD-10-CM | POA: Diagnosis not present

## 2024-10-01 DIAGNOSIS — E785 Hyperlipidemia, unspecified: Secondary | ICD-10-CM

## 2024-10-01 DIAGNOSIS — F3132 Bipolar disorder, current episode depressed, moderate: Secondary | ICD-10-CM

## 2024-10-01 DIAGNOSIS — Z0000 Encounter for general adult medical examination without abnormal findings: Secondary | ICD-10-CM

## 2024-10-01 DIAGNOSIS — Z950 Presence of cardiac pacemaker: Secondary | ICD-10-CM

## 2024-10-01 DIAGNOSIS — K219 Gastro-esophageal reflux disease without esophagitis: Secondary | ICD-10-CM

## 2024-10-01 DIAGNOSIS — R2681 Unsteadiness on feet: Secondary | ICD-10-CM

## 2024-10-01 DIAGNOSIS — Z96641 Presence of right artificial hip joint: Secondary | ICD-10-CM

## 2024-10-01 DIAGNOSIS — Z8669 Personal history of other diseases of the nervous system and sense organs: Secondary | ICD-10-CM

## 2024-10-01 DIAGNOSIS — H3530 Unspecified macular degeneration: Secondary | ICD-10-CM

## 2024-10-01 DIAGNOSIS — Z9882 Breast implant status: Secondary | ICD-10-CM

## 2024-10-01 DIAGNOSIS — R5383 Other fatigue: Secondary | ICD-10-CM

## 2024-10-01 DIAGNOSIS — R413 Other amnesia: Secondary | ICD-10-CM

## 2024-10-01 DIAGNOSIS — K5909 Other constipation: Secondary | ICD-10-CM

## 2024-10-01 DIAGNOSIS — E7800 Pure hypercholesterolemia, unspecified: Secondary | ICD-10-CM

## 2024-10-01 DIAGNOSIS — I10 Essential (primary) hypertension: Secondary | ICD-10-CM

## 2024-10-01 DIAGNOSIS — T8543XD Leakage of breast prosthesis and implant, subsequent encounter: Secondary | ICD-10-CM

## 2024-10-02 ENCOUNTER — Encounter: Payer: Self-pay | Admitting: Student

## 2024-10-02 ENCOUNTER — Other Ambulatory Visit (HOSPITAL_BASED_OUTPATIENT_CLINIC_OR_DEPARTMENT_OTHER): Payer: Self-pay

## 2024-10-02 ENCOUNTER — Ambulatory Visit: Attending: Student | Admitting: Student

## 2024-10-02 VITALS — BP 113/63 | HR 63 | Ht 62.0 in

## 2024-10-02 DIAGNOSIS — I2510 Atherosclerotic heart disease of native coronary artery without angina pectoris: Secondary | ICD-10-CM | POA: Diagnosis not present

## 2024-10-02 DIAGNOSIS — I495 Sick sinus syndrome: Secondary | ICD-10-CM | POA: Diagnosis not present

## 2024-10-02 LAB — CUP PACEART INCLINIC DEVICE CHECK
Battery Remaining Longevity: 49 mo
Battery Voltage: 2.98 V
Brady Statistic RA Percent Paced: 96 %
Brady Statistic RV Percent Paced: 0.06 %
Date Time Interrogation Session: 20251230125723
Implantable Lead Connection Status: 753985
Implantable Lead Connection Status: 753985
Implantable Lead Implant Date: 20190614
Implantable Lead Implant Date: 20190614
Implantable Lead Location: 753859
Implantable Lead Location: 753860
Implantable Pulse Generator Implant Date: 20190614
Lead Channel Impedance Value: 475 Ohm
Lead Channel Impedance Value: 550 Ohm
Lead Channel Pacing Threshold Amplitude: 0.5 V
Lead Channel Pacing Threshold Amplitude: 0.5 V
Lead Channel Pacing Threshold Amplitude: 0.875 V
Lead Channel Pacing Threshold Pulse Width: 0.5 ms
Lead Channel Pacing Threshold Pulse Width: 0.5 ms
Lead Channel Pacing Threshold Pulse Width: 0.5 ms
Lead Channel Sensing Intrinsic Amplitude: 12 mV
Lead Channel Sensing Intrinsic Amplitude: 3.2 mV
Lead Channel Setting Pacing Amplitude: 1.125
Lead Channel Setting Pacing Amplitude: 2 V
Lead Channel Setting Pacing Pulse Width: 0.5 ms
Lead Channel Setting Sensing Sensitivity: 2 mV
Pulse Gen Model: 2272
Pulse Gen Serial Number: 9031934

## 2024-10-02 NOTE — Patient Instructions (Addendum)
 Medication Instructions:  No medication changes today. *If you need a refill on your cardiac medications before your next appointment, please call your pharmacy*  Lab Work: No labwork ordered today. If you have labs (blood work) drawn today and your tests are completely normal, you will receive your results only by: MyChart Message (if you have MyChart) OR A paper copy in the mail If you have any lab test that is abnormal or we need to change your treatment, we will call you to review the results.  Testing/Procedures: No testing ordered today  Follow-Up: At [facility], you and your health needs are our priority.  As part of our continuing mission to provide you with exceptional heart care, our providers are all part of one team.  This team includes your primary Cardiologist (physician) and Advanced Practice Providers or APPs (Physician Assistants and Nurse Practitioners) who all work together to provide you with the care you need, when you need it.  Your next appointment:   12 month(s)  Provider:   You may see Meghan DELENA Primus, MD or one of the following Advanced Practice Providers on your designated Care Team:   Charlies Arthur, PA-C Dior Dominik Andy Stepan Verrette, PA-C Suzann Riddle, NP Daphne Barrack, NP    We recommend signing up for the patient portal called MyChart.  Sign up information is provided on this After Visit Summary.  MyChart is used to connect with patients for Virtual Visits (Telemedicine).  Patients are able to view lab/test results, encounter notes, upcoming appointments, etc.  Non-urgent messages can be sent to your provider as well.   To learn more about what you can do with MyChart, go to [email or web address].

## 2024-10-02 NOTE — Patient Instructions (Signed)
 It was a pleasure to se you today. Labs are stable. Return in one year or as needed. Continue follow up with Specialists. Please call if you have any medical needs or concerns  that we can assist with.

## 2024-10-02 NOTE — Progress Notes (Signed)
" °  Electrophysiology Office Note:   ID:  Hagar, Sadiq [birth year], MRN [id number]  Primary Cardiologist: Ezra Shuck, MD Electrophysiologist: Donnice DELENA Primus, MD      History of Present Illness:   Meghan Oliver is a 83 y.o. female with h/o SND s/p PPM, CAD, OSA, and depression seen today for routine electrophysiology followup.   Since last being seen in our clinic the patient reports doing well from a cardiac perspective. Occasionally, she gets a twinge of pain on her left chest. Not related to activity or exertion. No syncope, edema, or undue dyspnea.   Review of systems complete and found to be negative unless listed in HPI.   EP Information / Studies Reviewed:    EKG is ordered today. Personal review as below.  EKG Interpretation Date/Time:  Tuesday October 02 2024 12:41:58 EST Ventricular Rate:  63 PR Interval:  178 QRS Duration:  90 QT Interval:  398 QTC Calculation: 407 R Axis:   -43  Text Interpretation: Atrial-paced rhythm Left axis deviation   Confirmed by Lesia Sharper [phone]) on 10/02/2024 1:19:10 PM    PPM Interrogation-  reviewed in detail today,  See PACEART report.  Arrhythmia/Device History St. Jude Dual Chamber PPM implanted 2019 for SND    Physical Exam:   VS:  BP 113/63   Pulse 63   Ht 5' 2 (1.575 m)   LMP  (LMP Unknown)   SpO2 95%   BMI 17.56 kg/m    Wt Readings from Last 3 Encounters:  10/01/24 96 lb (43.5 kg)  09/21/24 99 lb 12.8 oz (45.3 kg)  08/16/24 97 lb 6.4 oz (44.2 kg)     GEN: No acute distress  NECK: No JVD; No carotid bruits CARDIAC: Regular rate and rhythm, no murmurs, rubs, gallops RESPIRATORY:  Clear to auscultation without rales, wheezing or rhonchi  ABDOMEN: Soft, non-tender, non-distended EXTREMITIES:  No edema; No deformity   ASSESSMENT AND PLAN:    SND s/p Abbott PPM  Normal PPM function See Pace Art report No changes today Will work to get her set back up on remotes.   CAD No s/s of ischemia.       Mild AI By Echo 03/2024. Echo > 75% with Grade 1 DD. No indication for further work up at this time.      Disposition:   Follow up with Dr. Primus in 12 months  Signed, Sharper Prentice Lesia, PA-C  "

## 2024-10-03 ENCOUNTER — Other Ambulatory Visit: Payer: Self-pay | Admitting: Gastroenterology

## 2024-10-05 ENCOUNTER — Other Ambulatory Visit (HOSPITAL_BASED_OUTPATIENT_CLINIC_OR_DEPARTMENT_OTHER): Payer: Self-pay

## 2024-10-05 ENCOUNTER — Telehealth: Payer: Self-pay

## 2024-10-05 MED ORDER — LINACLOTIDE 72 MCG PO CAPS
145.0000 ug | ORAL_CAPSULE | Freq: Every day | ORAL | 1 refills | Status: AC
  Filled 2024-10-05: qty 180, 90d supply, fill #0

## 2024-10-05 NOTE — Telephone Encounter (Signed)
 Called [employer] tech support and have gotten a new monitor shipped to pts home. LVM on pts phone letting her know to call us  when received so that we can put serial number in the site

## 2024-10-07 ENCOUNTER — Ambulatory Visit: Payer: Self-pay | Admitting: Student in an Organized Health Care Education/Training Program

## 2024-10-08 ENCOUNTER — Other Ambulatory Visit (HOSPITAL_BASED_OUTPATIENT_CLINIC_OR_DEPARTMENT_OTHER): Payer: Self-pay

## 2024-10-08 ENCOUNTER — Encounter: Payer: Self-pay | Admitting: Psychiatry

## 2024-10-08 ENCOUNTER — Other Ambulatory Visit: Payer: Self-pay

## 2024-10-08 ENCOUNTER — Ambulatory Visit (INDEPENDENT_AMBULATORY_CARE_PROVIDER_SITE_OTHER): Admitting: Psychiatry

## 2024-10-08 DIAGNOSIS — F5105 Insomnia due to other mental disorder: Secondary | ICD-10-CM

## 2024-10-08 DIAGNOSIS — F314 Bipolar disorder, current episode depressed, severe, without psychotic features: Secondary | ICD-10-CM | POA: Diagnosis not present

## 2024-10-08 DIAGNOSIS — F061 Catatonic disorder due to known physiological condition: Secondary | ICD-10-CM

## 2024-10-08 DIAGNOSIS — F411 Generalized anxiety disorder: Secondary | ICD-10-CM | POA: Diagnosis not present

## 2024-10-08 DIAGNOSIS — G4733 Obstructive sleep apnea (adult) (pediatric): Secondary | ICD-10-CM

## 2024-10-08 DIAGNOSIS — R413 Other amnesia: Secondary | ICD-10-CM | POA: Diagnosis not present

## 2024-10-08 MED ORDER — CLONAZEPAM 0.5 MG PO TABS
0.5000 mg | ORAL_TABLET | Freq: Two times a day (BID) | ORAL | 0 refills | Status: AC
  Filled 2024-10-08: qty 180, 90d supply, fill #0

## 2024-10-08 NOTE — Progress Notes (Signed)
 RAMSIE Oliver [id number] 03/23/1941 84 y.o.  Subjective:   Patient ID:  ALYSIANA Oliver is a 84 y.o. (DOB 12-Mar-1941) female. Patient was last seen August 21, 2018 Chief Complaint:  Chief Complaint  Patient presents with   Follow-up   Depression   Anxiety   Memory Loss     Meghan Oliver presents to the office today for follow-up of Severe TR bipolar depression with psychotic features including catatonia.  11/30/2018 was the last visit and the following was noted: Had ECT yesterday at [facility].  Last was Jan 8 and was doing well then.  Next scheduled April 15.  Had a lithium  level from there which is pending. Pretty good until the last 10 days to 2 weeks with less energy and motivation.  Bothered by old sick dog.  Worried about how that will affect her.  Has slept with the dog.  Enjoyed [place].  Going to gym and playing [place].  Took the class and didn't feel she caught on quickly. Wonders if it is bc of the ECT memory effects.  Bridge is harder and not playing.  Planning to return to [facility] mid April.   spent the winter in [place]  per usual..  No significant depression since here. ECT frequency about 6 weeks.  Gets very anxious the day of the ECT.  ECT consistently for a year.  Best in mood in 7-8 years.  Also the pacemaker helped.  Friends notice the benefit.  Plan:  Option increase in the nortriptyline  if needed.  Considered this.  They agree and would like to increase nortriptyline  to 75 to try to reduce tendency to depression before the ECT.   03/19/19  Addendum: Here the contents from an email from the nurse for [name]  Hello Dr. Geoffry,   I hope you and your family are healthy and well. I took Meghan Oliver to [facility] for ECT and here are labs from 01/26/19. I am going to also forward them to [facility] for follow up with her LFTs. They are still higher than I would like.  Her lithium  level was 0.51 on a daily dose of 300mg  qohs x 4 nights- alternating with 450mg  qohs x 3 nights. She is not  demonstrating any signs of elevated levels, minimal hand tremors, less anxiety and restlessness since her ECT treatment.  Her Nortryptyline level was 140 on 75mg  qhs.  I would like to continue her current dosages of both the above meds and recheck her lithium  level at her next scheduled ECT of 03/23/19.  Please let me know of any changes you would like to make. Take care, [name] Lithium  level was 0.4 on the 450mg  alt with 300 mg QOD. No changes were made in meds.  11/15/2019 phone call: Telephone call from patient's husband Dr. [name] First on 11/14/2019  Patient is scheduled for ECT soon for maintenance ECT.  He thinks it is been 2-1/2 months since the last ECT but he is going to check the date for sure.  It is still be done being done at [facility].  He is wondering about skipping this treatment because the patient has continued to be free of depression and seems cognitively clearer as these ECT treatments have been spread out further from each other.  Patient remains on medications as prescribed.  If it is truly been over 2 months since the last ECT then it is reasonable to consider discontinuing ECT.  It is unlikely that ECT with the frequency of greater  than 2 months is significantly helpful at preventing her relapse.  If however the ECT frequency is less than every 2 months it could still be helping to prevent recurrence.  He indicated he would consider this information and discuss it with her ECT team and make a decision.  They are heading to [place]  in the next few weeks.  Needs to schedule an appointment with me for follow-up because it is been many months.  He agrees.   02/19/2020 appt, the following noted: Moving to [facility] in a few months.  Ready to downsize.   Stopped ECT as discussed and has not been more deprressed.   Walks dog 4 times daily.   Lithium  0.7 on  01/30/20 on lithium  300 mg plus 150 mg on M, W, F Nortriptyline  70 on 75 mg HS. No SE except tremor.  Balance is not  great.  Very happy.   No depression since here.  No mood swings.  Sleep and appetite is OK.  No med changes:  04/03/2020 TC with the following noted: Patient's husband called stating that her tremor was a little worse and she was having some stutter. No evidence of stroke otherwise. First thing would be to check serum lithium  level and BMP.  Order written.  Have asked her husband to let us  know about the lab to which it should be sent.  04/22/20 TC witht he following noted: Lithium  level is not dangerously high but it is has crept up to 1.0 which is higher than desired for her.  Our goal for her is 0.5-0.7.  At the current level it is likely causing side effects so we should reduce the dosage from lithium  300 mg plus 150 mg on M, W, F  TO 300 mg daily.  Meaning drop off the 150 mg capsule.  It will take about 2 weeks for the level to gradually come down to the desired level.  05/08/2020 appt with the following noted: Seen with H and [name] nurse. Moving to [facility] and feels OK about it.  I think I'll be fine.   Pretty good with balance.  Doing yoga and it helps. Ronal Oliver has cancer. Old dog 58 & 1/2 yo still living. Meghan concerns when went to [place] for D's BD she had difficulty time with tremor lethargy, more confusion.  Better when go back home. [name] notes using more lorazepam . At times gait is unsteady. Not significantly depressed.  Can be anxious at times.  Eating okay.  Sleeping okay.  Is easily confused under stress. Plan: reduce lithium  to 300 mg nightly.  07/14/20 appt with the following noted: Seen with her husband as well as her family nurse. Feeling fine and pleased with that.   Some anxiety over the move to smaller place. Meghan Oliver also having heart problems Meghan Oliver. Meghan Oliver came and visited.    Plan: Continue nortriptyline  75 mg, paroxetine  20 mg, lithium  300 mg, Namenda  10 mg twice daily, lorazepam  0.5 mg 1/2-1 3 times daily as needed, mirtazapine  7.5 mg  nightly  02/27/2021 appointment with the following noted:  Seen with H and nurse [name] Received email from nurse [name] Dancer on 02/24/2021 indicated that he had moved into [facility] living in May.  Shontelle has gradually become more depressed and more anxious using lorazepam  as prescribed 3 times daily.  More memory issues.  She has remained engaged and initiating appropriate conversation.  She is very worried about her 25 year old dog who is in poor health and fears she  will become more depressed when the dog dies.  She and her husband do not wish to prefer to pursue further ECT unless absolutely necessary. Likes [facility] so far.   No depression by her report.  Sold big house in Feb.  It worked out well.   Worries about her dog 19 yo will die soon. H agrees she's done well with depression.   Enjoys things and has interests. Plan: Continue Lithium  300 mg daily  Continue nortriptyline  75 mg daily Continue paroxetine  20 mg daily Continue lorazepam  0.5 mg 3 times daily for both anxiety and catatonia Continue vitamin B6 and primidone  for tremor Cerefolin NAC 2 daily.  05/13/2021 appointment with the following noted: Happy with [facility].  H and she agree is doing well with depression.SABRA Richard socialization. She complains of memory.   Family visited and getting along with Alisa better.  H says Alisa is acting better.  Goes to dinner and activities together now.  Better relationship with Meghan Oliver.. Tremor has been OK.   Ativan  usually 0.25 mg TID and tolerated. Anxiety managed with this generally. Molly dog health more stable. To [place] before T'giving until April.   Plan: Continue Lithium  300 mg daily  Continue nortriptyline  75 mg daily Continue paroxetine  20 mg daily Continue lorazepam  0.5 mg 3 times daily for both anxiety and catatonia Continue vitamin B6 and primidone  for tremor   08/14/2021 appointment with the following noted: Seen with her husband [name] and nurse [name]. Everyone reports that  her mood has been stable.  Her anxiety is manageable with as needed lorazepam .  She is still happy with the transition to [facility].  They are preparing to go to [place]  for the winter per usual but may sell their house father there. Tolerating meds without any unusual side effects. Is dealing with grief over the loss of her dog Molly.  03/10/2022 appt noted:  seen with H and nurse [name] [facility] house. [name] not good phsyically hard to play golf.  Been really hard. He'll be 85 in July.  Some stress scheduling with D's.   60 th wedding necessary this month.   Mood up and down.  Sister in law across the street can be difficult and demeaning.  She feels unsettled.  Some worry. Some back pain limiting activity.   Sleep is ok except with pain awakening.   No SE, except tremor some worse about 3-5 and helped by lorazepam  0.25 mg then No greater than 1 mg lorazepam  daily Patient denies any recent difficulty with anxiety except as noted.  Patient denies difficulty with sleep initiation or maintenance. Denies appetite disturbance.    Patient has some difficulty with concentration.  Patient denies any suicidal ideation.  Usually takes Ativan  at night and sleeps well 8 hours.   Plan no med changes  10/23/213 appt noted: Still doing well. Pneumonia and hosp for 6 days 3 weeks ago.  Viral.  Improving at this time. Increased tremor gradually in 3-4 mos helped by lorazepam  avg 1.00-1.25 mg daily. No change in primidone . Total R hip August 2023 and done well.  Finishing home PT.   Appetite poor for 7-8 mos.  2 protein drinks daily but may not eat more than 1 good meal a day. [facility]. Substantial memory problems in hospital and in unusual enviornments. Nurse notices decline.  B with LBD passed in August. No pain meds or muscle relaxants beyond pain. No depression.  H note sometimes she has less motivation than others. [name] notes some normal waxing  and waning of mood. Good social interaction.   Not  spending winter in [facility] this year.  D still lives there. Plan: Increase mirtazapine  15 mg HS for appetite.  Also used for sleep and depression.  09/23/22 appt noted:  with nurse and [name] [name] feeling bad with more pain and fatigue.  [name] has CHF.   Made Jadda sad.  Lost a cgood friends.  Rough time.  Mostly worry about [name].  She feels afraid to be alone if something happens.   Not sig depressed.    Just worry . H can't be as active.  Taking a shower is hard.   She's back exercising again.   Went to [employer] for Thanksgiving in [facility] and plan to go back for Christmas. It's hard to travel. Told nurse she was depressed. Appetite is better and once asleep is OK. Using more lorazepam  for anxiety 3-4 daily. Plan: Continue Lithium  300 mg daily  Continue nortriptyline  75 mg daily Continue paroxetine  20 mg daily Continue lorazepam  0.5 mg 3 times daily for both anxiety and catatonia Continue vitamin B6 and primidone  for tremor Increased and it helped mirtazapine  15 mg HS for appetite.  Also used for sleep and depression.  10/19/22  RTC  [name] reports not doing well.  Went to [facility].  Recent pneumonia.  They are thinking this is aspiration pneurmonia. Lethargic and anxious with tremor. Ruminating.  Hot to cold .  Low tolerance.  Going downhill quickly.  She's verbalizing depression and desire to isolate.  Won't go out.  Started within the last 2 weeks. Usually doesn't complain when she's sick.   [name]'s biggest concern is that Sheppard is having trouble handling this bc she's up at night rambling and not really safe to be up at night.    Will see her 10/22/22 Get lithium  and nortriptyline  levels.   Lorene Macintosh, MD, [facility]     10/22/22 emergency appt with nurse [name] and H [name]: Pneumonia Oct and then again 2nd week January.  It appears to have largely resoved.  It's really bad.  Nausea. Anxious bc [name] ill.  Doesn't want him to leave.  [name] sees anxiety triggering anxiety with little to no appetite.  Hypersensitive to  stimuli like light and noises.  Doesn't want TV on.  [name] says totally reversed in last 6 weeks.    [name] reports she's overemotional.  Friends died. Losing wt 91.4# today. More trouble with sleep. Lorazepam  increased to 2 mg daily.  Not sleepy with it.  Doesn't seem to be helpful. Last week neglect hygiene but better this week a little. [name] says STM is poor at times and at times disoriented if in [place].  Will walk in bathroom and wonder where sh is. Barium swallow pending for evaluation of refulex.    10/26/22 RTC:  RTC   No improvement since Friday.  Gradually less engaged.  Can be overstimulated with visitors.   Disc nortriptyline  level 115 is a little higher than it was and higher levels can cause SE in elderly.   Reduce nortriptyline  to 50 mg HS. Taking lorazepam    Consider low dose of Vraylar or pramipexole.  Or Spravato .    Lorene Macintosh, MD, [facility]     11/15/22 appt noted: Dialogue with Dr. Rolan cardiologist agreed pt should be stable enough from CV perspective to receive Spravato  which could elevate BP.  If that occurs use prn metoprolol  25 mg. Disc in detail with Dr. Cloretta and he agrees Spravato  is appropriate though not FDA  approved for bipolar depression.  Disc 2 metanalysis in support of this and this alernative is safer and likely better tolerated than the option of ECT and faster than options of trials of Trintellix , Auvelity, Latuda  etc. Tolerating meds.  She is depressed and very anxious and intermittently confused.  Taking clothes on and off bc rapid changes in comfort.  Worried over [employer].  Sleep disrupted appetite some better with mirtazapine . Received Spravato  56 mg first time today and was dissociated.  Resolved over 2 hours.  No HA, NV.  Was distressed moderately and very ambivalent about Tx.  H and nurse supportive of tx plan and for the pt.    11/24/22 appt noted: Current meds: nortriptyline  50 mg HS, paroxetine  20 mg daily, lithium  300 mg nightly, mirtazapine  30 mg  nightly, memantine  10 mg twice daily.  Lorazepam  0.5 mg every 4 to 6 hours as needed catatonia or severe anxiety. Received first dose of Spravato  84 mg today.  She was more noticeably dissociated than with the 56 mg dose.  It did resolve over the 2-hour course of observation although she remained somewhat unsteady and needed to leave via wheelchair.  She did not have headache nausea or vomiting.  She remains ambivalent about the treatment. Husband notes she has continued to be anxious and depressed and was extremely anxious yesterday and somewhat confused.  At times would say things that did not make sense.  He remained supportive of the treatment while she remains ambivalent about the treatment.  Her appetite is good and she is eating well.  Sleep is variable.  H reported at times will be almost normal after receiving an extra dose of lorazepam  0.5 mg prn but this only lasts an hour or so. Plan: Continue Lithium  300 mg daily  Continue nortriptyline  50 mg daily Continue paroxetine  20 mg daily Continue vitamin B6 and primidone  for tremor Imirtazapine 30 mg HS for appetite,  used for sleep and depression.  Appetite better Switch lorazepam  to Loreev ER 2 mg AM and use lorazepam  0.5 mg prn.  For catatonia in hopes of more consistent relief.  Decision based on observation of periods of almost normalcy after some BZ doses.  We discussed potential switch of paroxetine  and nortriptyline  to Auvelity or Trintellix  or possibly Latuda .  However these switches will be difficult and it is hoped that the Spravato  may provide enough improvement to allow a switch in medicine more tolerable.  11/26/22 appt noted: Current meds: nortriptyline  50 mg HS, paroxetine  20 mg daily, lithium  300 mg nightly, mirtazapine  30 mg nightly, memantine  10 mg twice daily.  Lorazepam  0.5 mg every 4 to 6 hours as needed catatonia or severe anxiety.  Started Loreev 2 mg daily over weekend Received Spravato  84 mg today.   Spravato  with expected  dissociation.  She didn't experience relief from negative emotion.  Dissociation resolved over observation period.  No N, V, HA.   Still high anxiety and dread of most everything.  Depressed with negative thought predominates.  H notes periods of confusion and then maybe an hour of clarity sometimes after the dosing of lorazepam .  Then back to severe sx.  Eating is fair. No SI.  Fearful generally.  Needs help with ADLs.  Cannot take meds on her own. Tolerating meds without SE No noticeable effect from Loreev in terms of benefit or SE  12/01/22 appt noted: Received a phone call from nurse [name] this morning to call as soon as possible this patient very agitated and confused.  Thrashing around in bed.  At times talking about dying out of the fear that she was going to die.  Highly anxious.  Not very cooperative with drinking water  or taking medicines this morning. Discussed the situation with her husband as well and we have to options: 1's if we can get her to the office to receive Spravato  is scheduled today that may provide some immediate relief and forestall and may be prevent hospitalization.  The second option is to proceed directly to hospitalization.  If she is unable to take nutrition and hydration hospitalization will be necessary. Discussed that appeared to be catatonic hyperactivity and agitation which has not responded to the extended release lorazepam  2 mg a day.  Therefore she was given 1 mg of Ativan  this morning and did calm down enough to make it to the Spravato  administration this afternoon.  She was able to cooperate with Spravato  administration and tolerated it well with typical levels of dissociation which gradually resolved over the 2 hours.  She was able to eat and drink fluids prior to coming to the appointment. Current meds: nortriptyline  50 mg HS, paroxetine  20 mg daily, lithium  300 mg nightly,  memantine  10 mg twice daily.  Lorazepam  0.5 mg every 4 to 6 hours as needed catatonia or  severe anxiety.  Started Loreev 2 mg daily over weekend olanazpine 2.5 mg HS Received Spravato  84 mg today.   Husband notes that after Spravato  administration on Monday she was much improved for a period of several hours and was able to have conversations with her family members and make decisions about distributing some of her jewelry and other family decisions.  However over Monday evening into Tuesday she became more agitated and confused and anxious again.  There have been no obvious effects of taking the olanzapine  but it does not appear that long-acting lorazepam  2 mg as provided any significant relief. Plan: Continue Lithium  300 mg daily  Continue nortriptyline  50 mg daily Continue paroxetine  20 mg daily Continue vitamin B6 and primidone  for tremor DC mirtazapine  For catatonia: lorazepam0.5 mg q 4 hour mg daily as tolerated for catatonia with dose spread through the day..  Nurse manages. Restart Loreeve 2 mg daily and use prn lorazepam  for catatonia Increase olanzapine  5 mg HS for TRD, anxiety, catatonia, delusions of persecution  12/06/22 appt noted; Psych meds: as above except increase Loreev to 2 mg BID for severe catatonia. nortriptyline  50 mg HS, paroxetine   20 mg daily, lithium  300 mg nightly,  memantine  10 mg twice daily.  Lorazepam  0.5 mg every 4 to 6 hours as needed catatonia or severe anxiety.  Started Loreev 2 mg increased BID for severe catatonia,  increased olanazpine 5 mg HS for 3 days. Received 5th dose of Spravato  84 mg today.    She was able to cooperate with Spravato  administration and tolerated it well with typical levels of dissociation which gradually resolved over the 2 hours.  She was able to eat and drink fluids prior to coming to the appointment.   She still feels confused and depressed and anxious. Per family had periods over the weekend where she was confused thinking her H was dead even though he was right in front of her.  Also thinking she is going to die and not  get better.  Is eating and drinking fluids well.  Has needed extra lorazepam  to sleep.  Limited periods of lucidity per H. Tolerating high dose lorazepam  but limited benefit for catatonia. Plan: Continue Lithium  300 mg  daily  reduce nortriptyline  25 mg daily in anticipation of change Reduce paroxetine  10 mg daily in anticipation of change Continue vitamin B6 and primidone  for tremor For catatonia: lorazepam0.5 mg q 4 hour mg daily as tolerated for catatonia with dose spread through the day..  Nurse manages. DT severity of catatonic hyperactivy Loreeve 2 mg BID and use prn lorazepam  for catatonia Continue olanzapine  5 mg HS for TRD, anxiety, catatonia, delusions of persecution  12/08/22 appt noted: Psych meds:  Loreev to 2 mg BID for severe catatonia. nortriptyline  25 mg HS, paroxetine  10 mg daily, lithium  300 mg nightly,  memantine  10 mg twice daily.  Lorazepam  0.5 mg every 4 to 6 hours as needed catatonia or severe anxiety.  Started Loreev 2 mg increased BID for severe catatonia,  increased olanazpine 5 mg HS for 3 days. Received 6th dose of Spravato  84 mg today.    She was able to cooperate with Spravato  administration and tolerated it well with typical levels of dissociation which gradually resolved over the 2 hours.  She was able to eat and drink fluids prior to coming to the appointment.   She still feels confused and depressed and anxious. Hopeless.  Thoughts she is going to die. Per nurse and H no sig improvement noted with spravato .   No current alcohol problems  01/14/2023 appointment on an urgent basis: Hospitalized due to psychiatry at [facility] from 12/10/2022 until 01/03/2023.  Hospitalized for bipolar disorder severe depression with catatonia.  Received ECT and Latuda  60 mg daily was started.  Stopped paroxetine , lithium , nortriptyline , and mirtazapine .  Lorazepam  was switched to clonazepam . Current psych meds clonazepam  0.5 mg twice daily, Latuda  60 mg daily.  Memantine  10 mg twice  daily, weekly ECT rec Wt 101# and better appetite.  Sleep not great but ok.  Will lie awake some.  H says it's pretty good. Alert in the day.  No SE with meds.  Tremor better.   Lorazepam  only on ECT days. Plan:  For catatonia: Continue clonazepam  0.5 mg twice daily also for anxiety Continue Latuda  60 mg daily Continue lorazepam  0.5 mg as needed which is currently being used rarely. Recommend continue ECT  weekly as [facility] recommends  02/22/23    [name] May nurse called at 11am.  Haillie is doing better, receiving ECT twice this week. Not having breakthrough anxiety now and [name] wants to decrease her morning dose of Klonopin  to help decrease her sedation. She wants to give her 1/2 of 0.5mg  Klonopin  in the am and just give her regular dose the other two times a day. Is this okay.        Electronically signed by Joshua Craven L at 02/22/2023 11:21 AM  03/11/23 appt:  Current psych med:  clonazepam  0.25 mg AM and 0.5 mg HS on 5/21, Latuda  60,  Worry anxiety.  Not as happy as those around here. Reduced ECT to weekly does not maintain the benefit.  But they didn't want to continue it.   [name] has  talked with ECT doc about holding off on ECT and missed dose but she's gotten more depressed.   Did twice weekly for awhle and it was beneficial.  Sched for Tuesday Plan: Start Trintellix  off label for dep.  Trying to find solution that would allow stopping ECT Recommend continue ECT  but increase to twice weekly as [facility] recommends  04/15/23 appt noted:  with nurse and H Reduced clonazepam  to 0.25 mg AM and 0.5 mg HS, Latuda  60,  incr Trintellix  10 ECT once or twice weekly.  Took 13 day holiday with ECT and progressively downhill.  Worse if goes more than a week without ECT.   Some interest but not much initiative.  Read paper some.  Can initiate conversation with nurse with some recent details.  Still other memory problems frustrating to everyone.   Some memory px even before ECT but  worse. Sleep good.  Feels good today and appetite ok.   D been at the house lately.  D from [place] No excessive sleep.   She naps some easily in the day.  No sig changes with less clonazepam .  Minimal effect from counseling DT memory. No new concerns.   No SE.    05/20/23 appt noted: Tolerated increase in Trintellix  to 15 mg daily.  Reduced clonazepam  0.25 mg AM and 0.375 mg HS. No prn lorazepam .  No dep verbalized since here.  Trouble with sleep.  Intial insomnia.  No caffeine .  Some napping.  Not drowsy now.  Will lay on couch after breakfast.   Not anxious about anything.  Sleep trouble in the last few days. Last ECT a little over 2 weeks to wait another week if possible.  H thinks she is more awake with less ECT.  Will spontaneously seek out paper and fix several. Where do we go from here?   Per H .  Wants her to be able to stop ECT. Nurse notes better exec function.  Nurse has seen some lack of motivation and little spontaneous speech.  Won't engage in conversation much per family.   Still some rumination.   She has no other complaints other than some physical concerns.  Plan: For catatonia: clonazepam  to 0.25 mg AM and 0.375 mg HS for 2 weeks then 0.25 mg BID for cog reasons and not currently catatonic Continue Latuda  60 mg daily Continue lorazepam  0.5 mg as needed which is currently being used rarely. Incr Trintellix  to 20 mg daily off label for bipolar dep.  Trying to find solution that would allow stopping ECT Recommend continue ECT  but increase to twice weekly as [facility] recommends but considering reduction to every 3 weeks is reasonable.  She is not dep now.  06/24/23 appt noted: urgent appt with H and nurse. Tolerated increase in Trintellix  to 20 mg daily.  Reduced clonazepam  0.25 mg AM and 0.375 mg HS. No prn lorazepam , mirtazapine  7.5 mg HS, Latuda  60.  Memantine  10 BID Last eCT Monday every 3-3&1/2 weeks.   Mood and anxiety dipped with time away from ECT.   ECT still helpful.   Away from ECT harder to read and watch TV.   [facility] wants 2 week interval. Minimal initiation of conversation with others.   Not sure what to say in conversation.   General malaise over the last 10 days. Appetite is fair.  D Darice came last weekend and was helpful.  Not dep today but was before ECT this week.  Meds have not prevented recurrence. H asks about restarting lithium  which helped in the past.  07/25/23 appt : seen with nurse Last ECT 07/19/23.   Lately weekly bc sx got worse.  Nurse has seen improvement.  [facility] saying it is helpful. Added Concerta  18 mg AM.  No SE.  Nurse noticed a little more attempt to conc but not a lot of benefit.   No prn lorazepam  needed.   H says she's sleepy a lot of the time.  Nurse suspects clonazepam  as the source  of sleepiness. No benefit with more clonazepam . Sleep helped by mirtazapine  7.5 mg HS. No SE with Trintellix  20 mg  Apppetite pretty normal.   3 days without primidone  but tremor noticeably worse. Planning to go to [facility] for T'[facility] week. H using cane and less mobile. Has assistance at home for parts of the day.  Uses Rolator at night when gets up to urinate.  08/26/23 appt noted:  with nurse meds No sig dep nor anxiety.  Sleep good.  Untreated OSA per family.   Per nurse more effort with things with increase concerta  27 AM.  No SE.   Weekly ECT ongoing.  Will be 11 days . Going to [facility] for a week for T'giving.  Looking forward to seeing g'Oliver.   No prn lorazepam  needed.   Has pacemaker for heart rate.   ECT clearly helping.   H agrees gradually improving.    Appetite ok.  Wt consistent 97-99#.   Neuro FU for eval cog 09/14/23.   Needs help with ADLs but less than before.   Plan: increase off label Concerta  DT no sig change and no SE at 27 mg so increase to 36 mg AM for TRD and cog problems  10/14/23 appt noted:  with nurse.   Psych meds: Clonazepam  0.25  nightly for catatonia, lorazepam  0.5 mg as needed he anxiety or insomnia, Latuda   60 mg, memantine  10 mg twice daily, Concerta  36 mg every morning, primidone  100 every morning and 50 nightly for tremor.,  Trintellix  20 mg daily. No difference with increase Concerta .  No benefit.   Episode accidental taking clonazepam  2 mg HS and excessively drowsy for 3 hours.   Neuro reported potential SZ activity on EEG.  Plans 24 hour EEG to evaluate.  She & H had Covid and missed ECT for a month.  When missed ECT was worse with less engagement, lethargy, poor appetite and lack of initiation.  Better with resuming ECT.   Good evening last night and went out to dinner.   Cindy here from [place].  Last ECT 10/03/23 and then 10/11/23 and next will be 10/18/23.  Only weekly ECT until fully better then back to every other day. Now dep better again with ECT and less distressed.   Increase stress and anxiety with Meghan B Joe with lymphoma recurrence.   Appetite OK now.   Plan: DC Concerta  DT NR  01/02/24 appt noted:  no show for appt Per Dr. Georjean: 10/24/23 1 hour EEG: Her 1-hour EEG in 10/2023 showed occasional right temporal focal slowing and occasional right frontotemporal epileptiform discharges. She had a 24-hour ambulatory EEG in February which showed occasional bifrontal spikes, maximal over the right frontopolar region, independent sharp waves over the bilateral temporal regions with triphasic morphology. No electrographic seizures seen.  Georjean Darice HERO, MD    12/20/23 12:32 PM Note Spoke to patient's psychiatrist at [facility], Dr. Verline about her EEG. She has not had any clinical symptoms of seizure that can be determined. We discussed treating EEG versus treating patient, and agree that since she has not had any clinical symptoms, close clinical monitoring off seizure medication will be beneficial. He reviewed her prior visits and notes that ECT has been very helpful and lifesaving for Meghan Oliver, and that starting seizure medication would affect ECT treatment, but if there is no other option, they can work  around it. Agreed to monitor patient, we will look into starting a different headache prophylactic medication that is not an anti-seizure medication.  02/10/24 appt noted: Med: Trintellix  20, Latuda  60 mg daily, mirtazapine  7.5 mg HS, clonazepam  0.25 mg BID, Memantine  10 BID, lorazepam  0.5 mg about once per week.   Worsening HA over last couple of mos.  Daily.   ECT every 3 weeks.  Overall better 2 weeks after ECT and then down more the next week.   No HA today and no anxiety.   Appetite good 96# stable.  Some walking daily.  No unusual fear.   No clear SE.   Out socially once or twice weekly.   H notes cognition will be clear cognitively and do well for a day or 2 and then 90% worse for no reason.   Plan: Reduce  Latuda  40 mg daily for mental and physical clarity  03/16/24 appt noted:  with H and nurse Med: Trintellix  20, Latuda  40 mg daily, mirtazapine  7.5 mg HS, clonazepam  0.25 mg BID, Memantine  10 BID, lorazepam  0.5 mg about once per week.   No improvement in energy with reduced Latuda .  No change in memory.  Still sig STM issues without clear correlation with ECT.  ECT q 3 weeks.  Then changed to 2 weeks bc worse. Some sx worse with less Latuda . Less motivation to exercise.  Will be content to just lay around 20 hours daily.   Fleeting moments of clarity and then it goes away.   Plan:  if not better, add 1/2 modafinil  each morning for 1 week and if no benefit not side effects, increase to 1 each morning.  10/08/24 appt noted:  Med: Trintellix  20, Latuda  60 mg daily, mirtazapine  7.5 mg HS, clonazepam  0.25 mg BID, Memantine  10 BID, lorazepam  0.5 mg about once per week.  Modafinil  initally helped  but may have lost the benefit. ECT [facility] q 4 weeks.  Went 6 weeks without ECT during Meghan injury.  But by 6 weeks dep was much worse and needed boosters.   Done well with progil 100 mg Am. Took it 6 weeks without HA.  Did initially very well with Progil with more engagement and alertness but  then [name] fell with fx shoulder surgery and complications leading to rehab. Worse HA lately.   [name] concerned worry over LT issues with ECT.  She's not ambitious,  no initiative. No longer wants to engage socially.  Doesn't feel as smart as the others.   She has issues with memory and word finding.   Has 24 hour care.  .   Lies around unless.  Sometimes uses hand signals instead of verbal responses.   [name] says a lot of napping during the day.  [name] says she's resting more than napping. Earlier to bed than in the past.   Appetite not great but stable wt 96#.     ECT-MADRS    Flowsheet Row Clinical Support from 12/08/2022 in [facility]  MADRS Total Score 54   GAD-7    Flowsheet Row Office Visit from 10/01/2024 in [facility] Office Visit from 02/16/2024 in [facility]  Total GAD-7 Score 13 16   Mini-Mental    Flowsheet Row Office Visit from 12/07/2023 in [facility]  Total Score (max 30 points ) 28   PHQ2-9    Flowsheet Row Office Visit from 10/01/2024 in [facility] Office Visit from 02/16/2024 in [facility] Office Visit from 01/14/2023 in [facility] Office Visit from 10/11/2022 in [facility] Office Visit from  08/17/2022 in [facility]  PHQ-2 Total Score 5 6 4 6  0  PHQ-9 Total Score 23 21 14  -- --   Flowsheet Row ED to Hosp-Admission (Discharged) from 07/01/2022 in [place] [facility] Admission (Discharged) from 05/18/2022 in [place] [facility] Pre-Admission Testing 60 from 05/10/2022 in [facility]  C-SSRS RISK CATEGORY No Risk No Risk No Risk   Past Psychiatric Medication Trials:  Prozac  40 with lithium  and Zyprexa  2.5 to 5 mg,  nortriptyline , paroxetine ,  Effexor 225 Wellbutrin 300 mirtazapine ,  Trintellix  20 Mirtazapine  7.5 Others  Pramipexole 0.625 daily hallucinations  Rexulti EPS, Abilify 15, Vraylar  1.5 1 week,  Latuda  60 mg  Olanzapine  10 briefly Caplyta  briefly no response   Lamotrigine level 7.5  Namenda ,  lorazepam ,  Clonazaam 0.5 BID  no response Spravato  after 6 tx of 84 mg and 2 of 56 mg .  Hospitalized at [facility] on psychiatric unit from 12/10/2022 to 01/03/2023 and received ECT and started Latuda  60 mg daily.  Stopped paroxetine , nortriptyline , and lithium .  this list is not exhaustive  Review of Systems:  Review of Systems  Constitutional:  Positive for fatigue. Negative for appetite change.  Cardiovascular:  Negative for palpitations.  Gastrointestinal:  Negative for nausea.       Linzess  and Mozantic managed   Musculoskeletal:  Positive for gait problem.  Neurological:  Positive for dizziness, weakness and headaches. Negative for tremors.       Some balance issues  Psychiatric/Behavioral:  Positive for confusion and decreased concentration. Negative for agitation, behavioral problems, dysphoric mood, hallucinations, self-injury, sleep disturbance and suicidal ideas. The patient is not nervous/anxious and is not hyperactive.     Medications: I have reviewed the patient's current medications.  Current Outpatient Medications  Medication Sig Dispense Refill   amLODipine  (NORVASC ) 10 MG tablet Take 1 tablet (10 mg total) by mouth daily. 90 tablet 1   aspirin  81 MG chewable tablet Chew 1 tablet (81 mg total) by mouth daily.     Atogepant  (QULIPTA ) 60 MG TABS Take 1 tablet (60 mg total) by mouth daily. 30 tablet 5   Calcium  Carbonate-Vitamin D  (CALCIUM  600/VITAMIN D  PO) Take 1 tablet by mouth 2 (two) times daily.     cetirizine (ZYRTEC) 10 MG tablet Take 10 mg by mouth as needed for allergies.     Cholecalciferol  (VITAMIN D3) 50 MCG (2000 UT) TABS Take 2,000 Units by mouth in the morning.     cloNIDine  (CATAPRES ) 0.1 MG tablet Take 1 tablet (0.1 mg total) by mouth as needed prior to ECT treatment 20 tablet 2   dexlansoprazole  (DEXILANT ) 60 MG capsule Take 1 capsule  (60 mg total) by mouth daily. 90 capsule 3   Galcanezumab -gnlm (EMGALITY ) 120 MG/ML SOAJ Inject 120 mg into the skin every 30 (thirty) days. 1 mL 11   glycerin  adult 2 g suppository Use 1 suppository as needed if you have not had a bowel movement in  4 to 5 days 25 suppository 0   hydrochlorothiazide  (HYDRODIURIL ) 12.5 MG tablet Take 1 tablet (12.5 mg total) by mouth daily. 90 tablet 1   Krill Oil 500 MG CAPS Take 500 mg by mouth daily.     lactulose  (CHRONULAC ) 10 GM/15ML solution Take 30 mLs (20 g total) by mouth 3 (three) times daily between meals as needed for mild constipation. 473 mL 3   Lavender Oil 80 MG CAPS Take 160 mg by mouth at bedtime.  levothyroxine  (SYNTHROID ) 75 MCG tablet Take 1 tablet (75 mcg total) by mouth daily. 90 tablet 2   levothyroxine  (SYNTHROID ) 75 MCG tablet Take 1 tablet (75 mcg total) by mouth daily. 90 tablet 2   linaclotide  (LINZESS ) 72 MCG capsule Take 2 capsules (145 mcg total) by mouth daily before breakfast. 180 capsule 1   LORazepam  (ATIVAN ) 0.5 MG tablet TAKE 2 TABLETS (1 MG)  BY MOUTH EVERY NIGHT AT BEDTIME AND 1 TABLET EVERY 4 HOURS AS NEEDED FOR ANXIETY 270 tablet 0   losartan  (COZAAR ) 100 MG tablet Take 1 tablet (100 mg total) by mouth daily. 90 tablet 1   Lurasidone  HCl 60 MG TABS Take 1 tablet (60 mg total) by mouth daily with breakfast. 90 tablet 0   magic mouthwash w/lidocaine  SOLN Take 5 mLs by mouth 3 (three) times daily as needed for mouth pain. 120 mL 0   memantine  (NAMENDA ) 10 MG tablet Take 1 tablet (10 mg total) by mouth 2 (two) times daily. 180 tablet 1   metoprolol  tartrate (LOPRESSOR ) 25 MG tablet Take 1 tablet (25 mg total) by mouth as needed. 45 tablet 0   mirtazapine  (REMERON ) 7.5 MG tablet Take 1 tablet (7.5 mg total) by mouth at bedtime. 90 tablet 0   modafinil  (PROVIGIL ) 100 MG tablet Take 1 tablet (100 mg total) in the morning 90 tablet 0   Multiple Vitamins-Minerals (MULTIVITAMIN WITH MINERALS) tablet Take 1 tablet by mouth daily.      Multiple Vitamins-Minerals (PRESERVISION AREDS 2 PO) Take 1 capsule by mouth in the morning and at bedtime.     neomycin -polymyxin b-dexamethasone  (MAXITROL ) 3.5-10000-0.1 OINT Apply thin line (1 cm) of ointment to right eye twice daily for 1 week. (Patient taking differently: as needed.) 3.5 g 1   neomycin -polymyxin-dexameth (MAXITROL ) 0.1 % OINT Apply a small amount into the affected eye twice a day for 1 week. 3.5 g 1   ofloxacin  (OCUFLOX ) 0.3 % ophthalmic solution Instill 1 drop into affected eye three times daily for 5 days. 5 mL 3   omeprazole -sodium bicarbonate  (ZEGERID ) 40-1100 MG capsule Take 1 capsule by mouth daily before breakfast. (Patient taking differently: Take 1 capsule by mouth as needed.) 90 capsule 3   ondansetron  (ZOFRAN -ODT) 4 MG disintegrating tablet Place 1 tablet every 6 hours by translingual route as needed. 20 tablet 0   polyethylene glycol (MIRALAX  / GLYCOLAX ) 17 g packet Take 17 g by mouth as needed for mild constipation.     primidone  (MYSOLINE ) 50 MG tablet Take 2 tablets (100 mg total) by mouth every morning AND 1 tablet (50 mg total) at bedtime. 270 tablet 0   sennosides-docusate sodium  (SENOKOT-S) 8.6-50 MG tablet Take 2 tablets by mouth as needed.     thiamine  (VITAMIN B-1) 100 MG tablet Take 1 tablet (100 mg total) by mouth daily. 100 tablet 3   valACYclovir  (VALTREX ) 1000 MG tablet Take 2 tablets (2,000 mg total) by mouth at onset, then take 2 tablets 12 hours later. 30 tablet 1   vitamin B-12 (CYANOCOBALAMIN ) 1000 MCG tablet Take 1,000 mcg by mouth at bedtime.     vortioxetine  HBr (TRINTELLIX ) 20 MG TABS tablet Take 1 tablet (20 mg total) by mouth daily. 90 tablet 0   clonazePAM  (KLONOPIN ) 0.5 MG tablet Take 1 tablet (0.5 mg total) by mouth 2 (two) times daily. 180 tablet 0   No current facility-administered medications for this visit.    Medication Side Effects: Other: tremor stable.  No worse.  Allergies:  Allergies  Allergen Reactions   Propranolol  Other (See Comments)    Low blood pressure. Bradycardia    Propranolol Hcl Other (See Comments)   Brexpiprazole Other (See Comments)    Aphasia and catatonia    Past Medical History:  Diagnosis Date   Anxiety    Arthritis    hips, spine (03/17/2018)   Bipolar II disorder (HCC)    CHF (congestive heart failure) (HCC)    Chronic bronchitis (HCC)    Chronic lower back pain    Chronic right hip pain    CKD (chronic kidney disease), stage II    Coronary artery disease    stent x1   Esophagitis, erosive    GAD (generalized anxiety disorder)    GERD (gastroesophageal reflux disease)    Headache    maybe monthly (03/17/2018))   Heart murmur, systolic    History of adenomatous polyp of colon    08-04-2016  tubular adenoma   History of blood transfusion 12/2017   related to vascular hematoma   History of electroconvulsive therapy    at [facility]--  started 04-15-2015 to 11-17-2016  total greater than 40 times   History of hiatal hernia    Hyperlipidemia    Hypertension    Hypothyroidism    Internal carotid artery stenosis, bilateral    per last duplex 05-01-2014  bilateral ICA 40-59%   Major depression, chronic    ECT treatments extensive and multiple started 07/ 2016   Memory loss    both short and long-term; needs frequent reminders to follow instrucitons (05/16/2017)   Migraines    none in years (03/17/2018)   OSA (obstructive sleep apnea)    per study 06/ 2012 moderate OSA  ; refuses to wear masks (03/17/2018)   Osteoporosis    Pneumonia 07/29/2022   Poor historian    due to short term memory loss   Presence of permanent cardiac pacemaker 03/17/2018   Pulmonary nodule    monitored by pcp   S/P placement of cardiac pacemaker 03/17/18 [facility]  03/18/2018   Short-term memory loss    Sick sinus syndrome (HCC)     Family History  Problem Relation Age of Onset   Heart attack Father 98       deceased   Hypertension Father    Heart disease Father    Dementia Brother     Parkinson's disease Brother    Heart disease Brother    Breast cancer Paternal Aunt        Age 40's   Breast cancer Paternal Grandmother        Age unknown   Colon cancer Neg Hx     Social History   Socioeconomic History   Marital status: Married    Spouse name: Dr. Sheppard Finn   Number of children: 2   Years of education: 15   Highest education level: Not on file  Occupational History   Occupation: housewife    Employer: UNEMPLOYED  Tobacco Use   Smoking status: Former    Current packs/day: 0.00    Average packs/day: 2.0 packs/day for 15.0 years (30.0 ttl pk-yrs)    Types: Cigarettes    Start date: 01/13/1956    Quit date: 01/13/1971    Years since quitting: 53.7    Passive exposure: Never   Smokeless tobacco: Never  Vaping Use   Vaping status: Never Used  Substance and Sexual Activity   Alcohol use: Not Currently    Comment: occassional 1 x a week  Drug use: Never   Sexual activity: Not Currently    Comment: intercourse age 36, sexual partners less than 5  Other Topics Concern   Not on file  Social History Narrative   Right handed   Drinks caffeine  no   Single home at [employer]   Social Drivers of Health   Tobacco Use: Medium Risk (10/08/2024)   Patient History    Smoking Tobacco Use: Former    Smokeless Tobacco Use: Never    Passive Exposure: Never  Physicist, Medical Strain: Low Risk  (12/12/2022)   Received from [facility]   Overall Financial Resource Strain (CARDIA)    Difficulty of Paying Living Expenses: Not hard at all  Food Insecurity: No Food Insecurity (10/01/2024)   Epic    Worried About Radiation Protection Practitioner of Food in the Last Year: Never true    Ran Out of Food in the Last Year: Never true  Transportation Needs: No Transportation Needs (10/01/2024)   Epic    Lack of Transportation (Medical): No    Lack of Transportation (Non-Medical): No  Physical Activity: Not on file  Stress: Not on file  Social Connections: Not on file   Intimate Partner Violence: Not At Risk (07/06/2022)   Humiliation, Afraid, Rape, and Kick questionnaire    Fear of Current or Ex-Partner: No    Emotionally Abused: No    Physically Abused: No    Sexually Abused: No  Depression (PHQ2-9): High Risk (10/01/2024)   Depression (PHQ2-9)    PHQ-2 Score: 23  Alcohol Screen: Not on file  Housing: Low Risk (10/01/2024)   Epic    Unable to Pay for Housing in the Last Year: No    Number of Times Moved in the Last Year: 0    Homeless in the Last Year: No  Utilities: Not At Risk (10/01/2024)   Epic    Threatened with loss of utilities: No  Health Literacy: Inadequate Health Literacy (10/01/2024)   B1300 Health Literacy    Frequency of need for help with medical instructions: Always    Past Medical History, Surgical history, Social history, and Family history were reviewed and updated as appropriate.   Please see review of systems for further details on the patient's review from today.   Objective:   Physical Exam:  LMP  (LMP Unknown)   Physical Exam Constitutional:      General: She is not in acute distress.    Appearance: Normal appearance. She is well-developed.  Musculoskeletal:        General: No deformity.  Neurological:     Mental Status: She is alert and oriented to person, place, and time.     Motor: Weakness present. No tremor.     Coordination: Coordination abnormal.     Comments: Cane use  Psychiatric:        Attention and Perception: She is attentive. She does not perceive auditory hallucinations.        Mood and Affect: Mood is anxious and depressed. Affect is not labile, blunt or inappropriate.        Speech: Speech is not slurred.        Behavior: Behavior is slowed. Behavior is not withdrawn or combative.        Thought Content: Thought content is not delusional. Thought content does not include homicidal or suicidal ideation. Thought content does not include suicidal plan.        Cognition and Memory: Cognition is  impaired. She exhibits impaired recent memory.  Judgment: Judgment normal.     Comments: Insight fair affected by STM px. No auditory or visual hallucinations. Denies sadness today but has complained of some dep in last couple of weeks.SABRA  anxiety remarkably improved Delusional guilt not elicited.  No specific fears elicited other than H's health. Affect pleasant and smiling today. neat Little spontaneous speech      Lab Review:     Component Value Date/Time   NA 138 09/24/2024 1032   NA 137 12/29/2022 0000   K 4.0 09/24/2024 1032   CL 102 09/24/2024 1032   CO2 28 09/24/2024 1032   GLUCOSE 97 09/24/2024 1032   BUN 32 (H) 09/24/2024 1032   BUN 12 12/29/2022 0000   CREATININE 0.79 09/24/2024 1032   CALCIUM  9.9 09/24/2024 1032   PROT 6.9 09/24/2024 1032   PROT 6.2 04/20/2017 1151   ALBUMIN 3.9 09/02/2022 1221   ALBUMIN 3.9 04/20/2017 1151   AST 17 09/24/2024 1032   ALT 18 09/24/2024 1032   ALKPHOS 64 09/02/2022 1221   BILITOT 0.3 09/24/2024 1032   BILITOT <0.2 04/20/2017 1151   GFRNONAA >60 07/08/2022 0429   GFRNONAA 50 (L) 07/10/2020 1021   GFRAA 58 (L) 07/10/2020 1021       Component Value Date/Time   WBC 6.0 09/24/2024 1032   RBC 4.13 09/24/2024 1032   HGB 12.3 09/24/2024 1032   HGB 10.2 (L) 04/20/2017 1151   HCT 37.5 09/24/2024 1032   HCT 29.6 (L) 04/20/2017 1151   PLT 282 09/24/2024 1032   PLT 385 (H) 04/20/2017 1151   MCV 90.8 09/24/2024 1032   MCV 89 04/20/2017 1151   MCH 29.8 09/24/2024 1032   MCHC 32.8 09/24/2024 1032   RDW 13.1 09/24/2024 1032   RDW 13.9 04/20/2017 1151   LYMPHSABS 2,310 06/27/2023 1242   LYMPHSABS 1.5 04/20/2017 1151   MONOABS 0.7 07/07/2022 0458   EOSABS 48 09/24/2024 1032   EOSABS 0.3 04/20/2017 1151   BASOSABS 30 09/24/2024 1032   BASOSABS 0.0 04/20/2017 1151    Lithium  Lvl  Date Value Ref Range Status  10/21/2022 0.5 (L) 0.6 - 1.2 mmol/L Final  10/21/2022 lithium  level 0.5 on 300 mg nightly is stable.,  Nortriptyline   115 on 75 mg HS.  08/02/22 nortriptyline  85, lihtium 0.5  Nortriptyline  level 51 on 50 mg nightly with paroxetine  20 mg daily  07/10/2020 lithium  level 0.6 at [facility] office and nortriptyline  level 130 on the current dosages of lithium  300 mg nightly and nortriptyline  75 mg nightly  02/20/2021 labs lithium  0.6 on 300 mg nightly.  Nortriptyline  level 49 which is lower than expected on 75 mg nightly along with paroxetine  20 mg daily.    No results found for: PHENYTOIN, PHENOBARB, VALPROATE, CBMZ   .res Assessment: Plan:    Severe bipolar I disorder, current or most recent episode depressed, with catatonia (HCC) - Plan: clonazePAM  (KLONOPIN ) 0.5 MG tablet  Generalized anxiety disorder - Plan: clonazePAM  (KLONOPIN ) 0.5 MG tablet  OSA (obstructive sleep apnea)  Insomnia due to mental condition  Memory impairment  Catatonia - Plan: clonazePAM  (KLONOPIN ) 0.5 MG tablet    History of lithium  toxicity repeatedly while on chlorthalidone . No tremor px off of the lithium .    45-minute visit today with  nurse present..  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  Recent decompensation and rapidly getting worse with some cognive impairment and mild psychotic sx and altered mental status. Her case has been highly complex and treatment resistant at times.  No current severe catatonia but there's a question as to whether cognitive and verbal slowness is a mild degree vs cognitive dysfunction dT dep or ECT SE.  No lorazepam  needed..   Was worse with reduction of clonazepam  without improvement in cog with reduction. So  increased it back to 0.5 mg BID but balance issues and sleepiness so reducing back to 0.25 mg BID.  Then reduced to 0.25 mg HS without cog benefit or alertness or energy  and more HA so increased back to 0.25 mg BID   She had remarkedly improved with the ECT + change in psychiatric medicine to Latuda  60 mg daily with clonazepam  0.5 mg twice daily. But  they would like to get her meds to the point she could stop ECT bc cog problems which are ongoing.  She is not ruminative.  Affect is bright.  Dep resolved including anhedonia largely with ECT.  Catatonia is largely managed but her responsiveness verbally is not consistently better..  Cog is better with skipping a couple of weeks of ECT but is not normal with STM px and not very talkative socially  she can recall events of the last few days..    Short-term memory impairment is ongoing.  Previously Disc Dr. Katina concerns about longterm ECT on cognition.  Disc risk of unmanaged dep on dementia risk. Seeing neuro for work up of potential SZ episodes. 12/07/23 Neuro Dr. Darice Shivers reviewed EEG with the following:  Her routine and 24-hour EEG were abnormal with spikes in the bifrontal regions (maximal at right frontopolar), bilateral temporal sharp waves with triphasic morphology. She has not had any convulsive seizures, there have been periods of catatonia lasting for several days which would be unusual for seizure, however post-ictal psychosis is a consideration.   For catatonia and anxiety and HA continue clonazepam  0.25 mg AM and 0.25 mg PM .  There was no benefit with less including cognition.    Continue lorazepam  0.5 mg as needed which is currently being used rarely. continue Trintellix  to 20 mg daily off label for bipolar dep.    Trial Reduced Latuda  40 mg daily for mental and physical clarity, failed.  Increased back to 60  We discussed the short-term risks associated with benzodiazepines including sedation and increased fall risk among others.  Discussed long-term side effect risk including dependence, potential withdrawal symptoms, and the potential eventual dose-related risk of dementia.  But recent studies from 2020 dispute this association between benzodiazepines and dementia risk. Newer studies in 2020 do not support an association with dementia.  Discussed potential metabolic side  effects associated with atypical antipsychotics, as well as potential risk for movement side effects. Advised pt to contact office if movement side effects occur.   Recommend continue ECT  as [facility] recommends. It it tends to recur between ECT if treatments are spread out.  Consider cytomel augmentation for cognition and mood, but last tSH was low normal.  options of trials of ultra-low dose lithium  Auvelity, etc. Reviewed the old chart and consider very low-dose Vraylar  Plan: continue all meds, but trial increase modafinil  to 150 mg AM to improve alertness, motivation, responsiveness Disc SE  FU 8 weeks  Lorene Macintosh MD, DFAPA Please see After Visit Summary for patient specific instructions.  Lorene Macintosh, MD, DFAPA   Future Appointments  Date Time Provider Department Center  12/31/2024  7:00 AM [facility]  02/06/2025 10:30 AM Shivers Darice HERO, MD [facility] None  04/01/2025  7:00  AM [facility] DEVICE [facility]OTES [facility]  04/01/2025  9:30 AM [facility] [address]  04/02/2025 10:30 AM Perri Ronal PARAS, MD [facility] [address]  07/01/2025  7:00 AM [facility] DEVICE REMOT[facility]  09/30/2025  7:00 AM [facility]ICE [facility]      No orders of the defined types were placed in this encounter.      -------------------------------

## 2024-10-13 ENCOUNTER — Other Ambulatory Visit: Payer: Self-pay | Admitting: Psychiatry

## 2024-10-13 DIAGNOSIS — F3132 Bipolar disorder, current episode depressed, moderate: Secondary | ICD-10-CM

## 2024-10-15 NOTE — Telephone Encounter (Signed)
Is pt still taking?

## 2024-10-16 NOTE — Telephone Encounter (Signed)
 Has been stopped

## 2024-10-17 NOTE — Telephone Encounter (Signed)
 Called pt lvm to see if the monitor was received and to get the serial number, left DC direct number

## 2024-10-19 NOTE — Telephone Encounter (Signed)
 Called pt again, lvm for pt to call us  back, will send certified letter

## 2024-10-19 NOTE — Telephone Encounter (Signed)
 Still unable to reach pt regarding new monitor and serial number, mychart not active since 2025

## 2024-10-24 ENCOUNTER — Other Ambulatory Visit: Payer: Self-pay | Admitting: Psychiatry

## 2024-10-24 ENCOUNTER — Other Ambulatory Visit (HOSPITAL_BASED_OUTPATIENT_CLINIC_OR_DEPARTMENT_OTHER): Payer: Self-pay

## 2024-10-24 DIAGNOSIS — F061 Catatonic disorder due to known physiological condition: Secondary | ICD-10-CM

## 2024-10-24 DIAGNOSIS — G4733 Obstructive sleep apnea (adult) (pediatric): Secondary | ICD-10-CM

## 2024-10-24 DIAGNOSIS — F3132 Bipolar disorder, current episode depressed, moderate: Secondary | ICD-10-CM

## 2024-10-24 MED ORDER — LURASIDONE HCL 60 MG PO TABS
60.0000 mg | ORAL_TABLET | Freq: Every day | ORAL | 0 refills | Status: AC
  Filled 2024-10-24: qty 90, 90d supply, fill #0

## 2024-10-24 MED ORDER — PRIMIDONE 50 MG PO TABS
ORAL_TABLET | ORAL | 0 refills | Status: AC
  Filled 2024-10-24: qty 270, 90d supply, fill #0

## 2024-10-25 ENCOUNTER — Other Ambulatory Visit (HOSPITAL_BASED_OUTPATIENT_CLINIC_OR_DEPARTMENT_OTHER): Payer: Self-pay

## 2024-10-26 ENCOUNTER — Other Ambulatory Visit: Payer: Self-pay

## 2024-10-28 ENCOUNTER — Other Ambulatory Visit: Payer: Self-pay | Admitting: Psychiatry

## 2024-10-28 DIAGNOSIS — G4733 Obstructive sleep apnea (adult) (pediatric): Secondary | ICD-10-CM

## 2024-10-28 DIAGNOSIS — F061 Catatonic disorder due to known physiological condition: Secondary | ICD-10-CM

## 2024-10-29 ENCOUNTER — Other Ambulatory Visit: Payer: Self-pay

## 2024-10-29 ENCOUNTER — Other Ambulatory Visit (HOSPITAL_BASED_OUTPATIENT_CLINIC_OR_DEPARTMENT_OTHER): Payer: Self-pay

## 2024-11-05 ENCOUNTER — Telehealth: Payer: Self-pay | Admitting: Psychiatry

## 2024-11-05 ENCOUNTER — Other Ambulatory Visit: Payer: Self-pay

## 2024-11-05 ENCOUNTER — Other Ambulatory Visit: Payer: Self-pay | Admitting: Psychiatry

## 2024-11-05 ENCOUNTER — Other Ambulatory Visit (HOSPITAL_BASED_OUTPATIENT_CLINIC_OR_DEPARTMENT_OTHER): Payer: Self-pay

## 2024-11-05 DIAGNOSIS — G4733 Obstructive sleep apnea (adult) (pediatric): Secondary | ICD-10-CM

## 2024-11-05 DIAGNOSIS — F061 Catatonic disorder due to known physiological condition: Secondary | ICD-10-CM

## 2024-11-05 MED ORDER — MODAFINIL 200 MG PO TABS
200.0000 mg | ORAL_TABLET | Freq: Every day | ORAL | 0 refills | Status: AC
  Filled 2024-11-05: qty 90, 90d supply, fill #0

## 2024-11-05 NOTE — Telephone Encounter (Signed)
 Reviewed email.  Partial benefit 150 mg Provigil .  No SE .  Desire to try higher dose to further improve alertness engagement withothers, potentially mood and cognition.  Rx sent 200 mg AM

## 2024-11-05 NOTE — Telephone Encounter (Signed)
 [name] Destine, Saanvi's nurse, called and LM  asking Dr. Geoffry to review the two emails she sent to his [facility] email re: Candis. She needs to get an update on a RX for Provigil .

## 2024-11-07 ENCOUNTER — Other Ambulatory Visit: Payer: Self-pay

## 2024-11-08 ENCOUNTER — Other Ambulatory Visit (HOSPITAL_BASED_OUTPATIENT_CLINIC_OR_DEPARTMENT_OTHER): Payer: Self-pay

## 2024-11-08 NOTE — Telephone Encounter (Signed)
 She did get your email, she wanted me to thank you. She had to fly to [place]  on Monday for emergency with Dr. Sheppard and is at the airport now coming back.

## 2024-11-09 ENCOUNTER — Other Ambulatory Visit (HOSPITAL_BASED_OUTPATIENT_CLINIC_OR_DEPARTMENT_OTHER): Payer: Self-pay

## 2025-02-06 ENCOUNTER — Ambulatory Visit: Payer: Self-pay | Admitting: Neurology

## 2025-04-01 ENCOUNTER — Other Ambulatory Visit

## 2025-04-02 ENCOUNTER — Ambulatory Visit: Admitting: Internal Medicine
